# Patient Record
Sex: Female | Born: 1935
Health system: Southern US, Community
[De-identification: ages and names within clinical notes are randomized; demographics above are authoritative.]

## PROBLEM LIST (undated history)

## (undated) ENCOUNTER — Telehealth

## (undated) ENCOUNTER — Encounter: Attending: Hematology & Oncology | Primary: Hematology & Oncology

## (undated) ENCOUNTER — Ambulatory Visit: Payer: MEDICARE | Attending: Nephrology | Primary: Nephrology

## (undated) ENCOUNTER — Encounter

## (undated) ENCOUNTER — Ambulatory Visit: Payer: MEDICARE

## (undated) ENCOUNTER — Telehealth: Attending: Nephrology | Primary: Nephrology

## (undated) ENCOUNTER — Ambulatory Visit

## (undated) ENCOUNTER — Encounter: Attending: Internal Medicine | Primary: Internal Medicine

## (undated) ENCOUNTER — Encounter: Attending: Nephrology | Primary: Nephrology

## (undated) DIAGNOSIS — N959 Unspecified menopausal and perimenopausal disorder: Secondary | ICD-10-CM

## (undated) DIAGNOSIS — R6339 Other feeding difficulties: Secondary | ICD-10-CM

## (undated) DIAGNOSIS — G629 Polyneuropathy, unspecified: Secondary | ICD-10-CM

## (undated) DIAGNOSIS — K2210 Ulcer of esophagus without bleeding: Secondary | ICD-10-CM

## (undated) DIAGNOSIS — D649 Anemia, unspecified: Secondary | ICD-10-CM

## (undated) DIAGNOSIS — I4891 Unspecified atrial fibrillation: Secondary | ICD-10-CM

## (undated) DIAGNOSIS — E539 Vitamin B deficiency, unspecified: Secondary | ICD-10-CM

## (undated) DIAGNOSIS — B258 Other cytomegaloviral diseases: Secondary | ICD-10-CM

## (undated) DIAGNOSIS — I509 Heart failure, unspecified: Secondary | ICD-10-CM

## (undated) DIAGNOSIS — E039 Hypothyroidism, unspecified: Secondary | ICD-10-CM

## (undated) DIAGNOSIS — K449 Diaphragmatic hernia without obstruction or gangrene: Secondary | ICD-10-CM

## (undated) DIAGNOSIS — R0689 Other abnormalities of breathing: Secondary | ICD-10-CM

## (undated) DIAGNOSIS — C50919 Malignant neoplasm of unspecified site of unspecified female breast: Secondary | ICD-10-CM

## (undated) DIAGNOSIS — M329 Systemic lupus erythematosus, unspecified: Secondary | ICD-10-CM

## (undated) DIAGNOSIS — K219 Gastro-esophageal reflux disease without esophagitis: Secondary | ICD-10-CM

## (undated) DIAGNOSIS — Z923 Personal history of irradiation: Secondary | ICD-10-CM

## (undated) DIAGNOSIS — I272 Pulmonary hypertension, unspecified: Secondary | ICD-10-CM

## (undated) DIAGNOSIS — R633 Feeding difficulties: Secondary | ICD-10-CM

## (undated) DIAGNOSIS — M1990 Unspecified osteoarthritis, unspecified site: Secondary | ICD-10-CM

## (undated) DIAGNOSIS — E785 Hyperlipidemia, unspecified: Secondary | ICD-10-CM

## (undated) DIAGNOSIS — I10 Essential (primary) hypertension: Secondary | ICD-10-CM

## (undated) DIAGNOSIS — R600 Localized edema: Secondary | ICD-10-CM

## (undated) DIAGNOSIS — Z9109 Other allergy status, other than to drugs and biological substances: Secondary | ICD-10-CM

## (undated) DIAGNOSIS — N009 Acute nephritic syndrome with unspecified morphologic changes: Secondary | ICD-10-CM

## (undated) DIAGNOSIS — IMO0001 Reserved for inherently not codable concepts without codable children: Secondary | ICD-10-CM

## (undated) DIAGNOSIS — G609 Hereditary and idiopathic neuropathy, unspecified: Secondary | ICD-10-CM

## (undated) DIAGNOSIS — R0902 Hypoxemia: Secondary | ICD-10-CM

## (undated) DIAGNOSIS — N3942 Incontinence without sensory awareness: Secondary | ICD-10-CM

## (undated) DIAGNOSIS — N289 Disorder of kidney and ureter, unspecified: Secondary | ICD-10-CM

## (undated) DIAGNOSIS — B0223 Postherpetic polyneuropathy: Secondary | ICD-10-CM

## (undated) DIAGNOSIS — I82409 Acute embolism and thrombosis of unspecified deep veins of unspecified lower extremity: Secondary | ICD-10-CM

## (undated) DIAGNOSIS — M8580 Other specified disorders of bone density and structure, unspecified site: Secondary | ICD-10-CM

## (undated) DIAGNOSIS — R6889 Other general symptoms and signs: Secondary | ICD-10-CM

## (undated) DIAGNOSIS — B029 Zoster without complications: Secondary | ICD-10-CM

## (undated) DIAGNOSIS — M5430 Sciatica, unspecified side: Secondary | ICD-10-CM

## (undated) DIAGNOSIS — I270 Primary pulmonary hypertension: Secondary | ICD-10-CM

## (undated) DIAGNOSIS — I499 Cardiac arrhythmia, unspecified: Secondary | ICD-10-CM

## (undated) DIAGNOSIS — J189 Pneumonia, unspecified organism: Secondary | ICD-10-CM

## (undated) DIAGNOSIS — K208 Other esophagitis: Secondary | ICD-10-CM

## (undated) DIAGNOSIS — C801 Malignant (primary) neoplasm, unspecified: Secondary | ICD-10-CM

## (undated) HISTORY — DX: Pneumonia, unspecified organism: J18.9

## (undated) HISTORY — PX: STOMACH SURGERY: SHX791

## (undated) HISTORY — PX: DIAGNOSTIC LAPAROSCOPY: SUR761

## (undated) HISTORY — DX: Other cytomegaloviral diseases: B25.8

## (undated) HISTORY — DX: Postherpetic polyneuropathy: B02.23

## (undated) HISTORY — DX: Ulcer of esophagus without bleeding: K22.10

## (undated) HISTORY — DX: Primary pulmonary hypertension: I27.0

## (undated) HISTORY — DX: Unspecified menopausal and perimenopausal disorder: N95.9

## (undated) HISTORY — DX: Other esophagitis: K20.8

## (undated) HISTORY — DX: Systemic lupus erythematosus, unspecified: M32.9

## (undated) HISTORY — PX: MOHS SURGERY: SHX181

## (undated) HISTORY — DX: Acute nephritic syndrome with unspecified morphologic changes: N00.9

## (undated) HISTORY — DX: Hypothyroidism, unspecified: E03.9

## (undated) HISTORY — DX: Pulmonary hypertension, unspecified: I27.20

## (undated) HISTORY — PX: BREAST SURGERY: SHX581

## (undated) SURGERY — ESOPHAGOGASTRODUODENOSCOPY (EGD) WITH PROPOFOL
Anesthesia: Monitor Anesthesia Care

---
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
## 1998-01-30 ENCOUNTER — Encounter: Admission: RE | Admit: 1998-01-30 | Discharge: 1998-01-30 | Payer: Self-pay | Admitting: Internal Medicine

## 1998-05-27 ENCOUNTER — Encounter: Admission: RE | Admit: 1998-05-27 | Discharge: 1998-05-27 | Payer: Self-pay | Admitting: Internal Medicine

## 1998-05-28 ENCOUNTER — Encounter: Admission: RE | Admit: 1998-05-28 | Discharge: 1998-05-28 | Payer: Self-pay | Admitting: Internal Medicine

## 1998-06-29 ENCOUNTER — Other Ambulatory Visit: Admission: RE | Admit: 1998-06-29 | Discharge: 1998-06-29 | Payer: Self-pay | Admitting: *Deleted

## 1998-11-19 ENCOUNTER — Encounter: Admission: RE | Admit: 1998-11-19 | Discharge: 1998-11-19 | Payer: Self-pay | Admitting: Internal Medicine

## 1998-11-19 ENCOUNTER — Ambulatory Visit (HOSPITAL_COMMUNITY): Admission: RE | Admit: 1998-11-19 | Discharge: 1998-11-19 | Payer: Self-pay | Admitting: Internal Medicine

## 1999-12-09 ENCOUNTER — Encounter: Admission: RE | Admit: 1999-12-09 | Discharge: 1999-12-09 | Payer: Self-pay | Admitting: Internal Medicine

## 2000-04-24 ENCOUNTER — Ambulatory Visit (HOSPITAL_COMMUNITY): Admission: RE | Admit: 2000-04-24 | Discharge: 2000-04-24 | Payer: Self-pay | Admitting: Internal Medicine

## 2000-04-24 ENCOUNTER — Encounter: Admission: RE | Admit: 2000-04-24 | Discharge: 2000-04-24 | Payer: Self-pay | Admitting: Internal Medicine

## 2000-04-24 ENCOUNTER — Encounter: Payer: Self-pay | Admitting: Internal Medicine

## 2000-05-30 ENCOUNTER — Ambulatory Visit (HOSPITAL_COMMUNITY): Admission: RE | Admit: 2000-05-30 | Discharge: 2000-05-30 | Payer: Self-pay | Admitting: Internal Medicine

## 2000-05-30 ENCOUNTER — Encounter: Payer: Self-pay | Admitting: Internal Medicine

## 2000-05-30 ENCOUNTER — Encounter: Admission: RE | Admit: 2000-05-30 | Discharge: 2000-05-30 | Payer: Self-pay | Admitting: Hematology and Oncology

## 2000-07-12 ENCOUNTER — Encounter: Payer: Self-pay | Admitting: Internal Medicine

## 2000-07-12 ENCOUNTER — Encounter: Admission: RE | Admit: 2000-07-12 | Discharge: 2000-07-12 | Payer: Self-pay | Admitting: Internal Medicine

## 2000-07-12 ENCOUNTER — Ambulatory Visit (HOSPITAL_COMMUNITY): Admission: RE | Admit: 2000-07-12 | Discharge: 2000-07-12 | Payer: Self-pay | Admitting: Internal Medicine

## 2000-12-22 ENCOUNTER — Encounter: Admission: RE | Admit: 2000-12-22 | Discharge: 2000-12-22 | Payer: Self-pay | Admitting: Internal Medicine

## 2001-07-11 ENCOUNTER — Encounter: Admission: RE | Admit: 2001-07-11 | Discharge: 2001-07-11 | Payer: Self-pay

## 2001-08-15 ENCOUNTER — Encounter: Admission: RE | Admit: 2001-08-15 | Discharge: 2001-08-15 | Payer: Self-pay

## 2001-12-07 ENCOUNTER — Ambulatory Visit (HOSPITAL_COMMUNITY): Admission: RE | Admit: 2001-12-07 | Discharge: 2001-12-07 | Payer: Self-pay | Admitting: Internal Medicine

## 2001-12-07 ENCOUNTER — Encounter: Admission: RE | Admit: 2001-12-07 | Discharge: 2001-12-07 | Payer: Self-pay | Admitting: Internal Medicine

## 2002-06-17 ENCOUNTER — Encounter: Admission: RE | Admit: 2002-06-17 | Discharge: 2002-06-17 | Payer: Self-pay | Admitting: Internal Medicine

## 2002-07-11 ENCOUNTER — Encounter: Admission: RE | Admit: 2002-07-11 | Discharge: 2002-07-11 | Payer: Self-pay | Admitting: Internal Medicine

## 2003-02-28 ENCOUNTER — Encounter: Admission: RE | Admit: 2003-02-28 | Discharge: 2003-02-28 | Payer: Self-pay | Admitting: Internal Medicine

## 2003-06-16 ENCOUNTER — Encounter: Admission: RE | Admit: 2003-06-16 | Discharge: 2003-06-16 | Payer: Self-pay | Admitting: Internal Medicine

## 2003-07-28 ENCOUNTER — Encounter: Admission: RE | Admit: 2003-07-28 | Discharge: 2003-07-28 | Payer: Self-pay | Admitting: Internal Medicine

## 2003-07-28 ENCOUNTER — Ambulatory Visit (HOSPITAL_COMMUNITY): Admission: RE | Admit: 2003-07-28 | Discharge: 2003-07-28 | Payer: Self-pay | Admitting: Internal Medicine

## 2003-09-16 ENCOUNTER — Encounter: Admission: RE | Admit: 2003-09-16 | Discharge: 2003-09-16 | Payer: Self-pay | Admitting: Internal Medicine

## 2003-10-03 ENCOUNTER — Encounter: Admission: RE | Admit: 2003-10-03 | Discharge: 2003-10-03 | Payer: Self-pay | Admitting: Internal Medicine

## 2004-03-11 ENCOUNTER — Encounter: Admission: RE | Admit: 2004-03-11 | Discharge: 2004-03-11 | Payer: Self-pay | Admitting: Psychiatry

## 2004-09-06 ENCOUNTER — Ambulatory Visit: Payer: Self-pay | Admitting: Internal Medicine

## 2004-09-06 ENCOUNTER — Ambulatory Visit (HOSPITAL_COMMUNITY): Admission: RE | Admit: 2004-09-06 | Discharge: 2004-09-06 | Payer: Self-pay | Admitting: Internal Medicine

## 2004-09-24 ENCOUNTER — Ambulatory Visit: Payer: Self-pay | Admitting: Internal Medicine

## 2004-10-06 ENCOUNTER — Ambulatory Visit: Payer: Self-pay | Admitting: Cardiology

## 2004-10-08 ENCOUNTER — Ambulatory Visit: Payer: Self-pay | Admitting: Cardiology

## 2004-11-16 ENCOUNTER — Ambulatory Visit (HOSPITAL_COMMUNITY): Admission: RE | Admit: 2004-11-16 | Discharge: 2004-11-16 | Payer: Self-pay | Admitting: Internal Medicine

## 2005-01-13 ENCOUNTER — Ambulatory Visit: Payer: Self-pay | Admitting: Podiatry

## 2005-02-25 ENCOUNTER — Other Ambulatory Visit: Payer: Self-pay

## 2005-02-25 ENCOUNTER — Emergency Department: Payer: Self-pay | Admitting: Emergency Medicine

## 2005-02-28 ENCOUNTER — Ambulatory Visit: Payer: Self-pay | Admitting: Emergency Medicine

## 2005-03-16 ENCOUNTER — Encounter: Payer: Self-pay | Admitting: Podiatry

## 2005-03-19 ENCOUNTER — Encounter: Payer: Self-pay | Admitting: Podiatry

## 2005-04-05 ENCOUNTER — Ambulatory Visit: Payer: Self-pay | Admitting: Obstetrics and Gynecology

## 2005-06-01 ENCOUNTER — Ambulatory Visit: Payer: Self-pay | Admitting: Unknown Physician Specialty

## 2005-12-16 ENCOUNTER — Ambulatory Visit: Payer: Self-pay | Admitting: Internal Medicine

## 2005-12-30 ENCOUNTER — Ambulatory Visit: Payer: Self-pay | Admitting: Internal Medicine

## 2006-01-18 ENCOUNTER — Encounter: Payer: Self-pay | Admitting: Internal Medicine

## 2006-02-16 ENCOUNTER — Ambulatory Visit (HOSPITAL_COMMUNITY): Admission: RE | Admit: 2006-02-16 | Discharge: 2006-02-16 | Payer: Self-pay | Admitting: Internal Medicine

## 2006-02-16 ENCOUNTER — Ambulatory Visit: Payer: Self-pay | Admitting: Internal Medicine

## 2006-04-12 ENCOUNTER — Emergency Department: Payer: Self-pay | Admitting: Emergency Medicine

## 2006-06-06 ENCOUNTER — Ambulatory Visit: Payer: Self-pay | Admitting: Psychiatry

## 2006-06-16 ENCOUNTER — Ambulatory Visit: Payer: Self-pay | Admitting: Psychiatry

## 2006-07-25 ENCOUNTER — Ambulatory Visit: Payer: Self-pay | Admitting: Obstetrics and Gynecology

## 2006-09-25 DIAGNOSIS — E119 Type 2 diabetes mellitus without complications: Secondary | ICD-10-CM

## 2006-09-25 DIAGNOSIS — M3213 Lung involvement in systemic lupus erythematosus: Secondary | ICD-10-CM

## 2006-09-25 DIAGNOSIS — Z87898 Personal history of other specified conditions: Secondary | ICD-10-CM

## 2006-09-25 DIAGNOSIS — D518 Other vitamin B12 deficiency anemias: Secondary | ICD-10-CM

## 2006-09-25 DIAGNOSIS — K219 Gastro-esophageal reflux disease without esophagitis: Secondary | ICD-10-CM

## 2006-09-25 DIAGNOSIS — E039 Hypothyroidism, unspecified: Secondary | ICD-10-CM

## 2006-09-25 DIAGNOSIS — Z87891 Personal history of nicotine dependence: Secondary | ICD-10-CM

## 2006-10-06 ENCOUNTER — Ambulatory Visit: Payer: Self-pay | Admitting: Internal Medicine

## 2006-10-06 DIAGNOSIS — G609 Hereditary and idiopathic neuropathy, unspecified: Secondary | ICD-10-CM | POA: Insufficient documentation

## 2006-10-09 ENCOUNTER — Telehealth (INDEPENDENT_AMBULATORY_CARE_PROVIDER_SITE_OTHER): Payer: Self-pay | Admitting: *Deleted

## 2006-10-09 LAB — CONVERTED CEMR LAB
CO2: 24 meq/L (ref 19–32)
Calcium: 9.1 mg/dL (ref 8.4–10.5)
Creatinine, Ser: 0.92 mg/dL (ref 0.40–1.20)
Glucose, Bld: 88 mg/dL (ref 70–99)
Potassium: 4.2 meq/L (ref 3.5–5.3)
Sed Rate: 21 mm/hr (ref 0–22)
Sodium: 141 meq/L (ref 135–145)
TSH: 4.921 microintl units/mL (ref 0.350–5.50)

## 2006-11-14 ENCOUNTER — Ambulatory Visit: Payer: Self-pay

## 2007-03-05 ENCOUNTER — Telehealth: Payer: Self-pay | Admitting: *Deleted

## 2007-03-22 ENCOUNTER — Ambulatory Visit: Payer: Self-pay | Admitting: Internal Medicine

## 2007-03-28 LAB — CONVERTED CEMR LAB
ALT: 11 units/L (ref 0–35)
Albumin: 4.3 g/dL (ref 3.5–5.2)
BUN: 18 mg/dL (ref 6–23)
Chloride: 106 meq/L (ref 96–112)
Creatinine, Ser: 0.98 mg/dL (ref 0.40–1.20)
Free T4: 1.5 ng/dL (ref 0.89–1.80)
HCT: 42.3 % (ref 36.0–46.0)
MCV: 97.5 fL (ref 78.0–100.0)
Potassium: 4.2 meq/L (ref 3.5–5.3)
RBC: 4.34 M/uL (ref 3.87–5.11)
Sed Rate: 18 mm/hr (ref 0–22)
Total Protein: 7 g/dL (ref 6.0–8.3)

## 2007-04-03 ENCOUNTER — Encounter (INDEPENDENT_AMBULATORY_CARE_PROVIDER_SITE_OTHER): Payer: Self-pay | Admitting: Internal Medicine

## 2007-04-03 ENCOUNTER — Telehealth (INDEPENDENT_AMBULATORY_CARE_PROVIDER_SITE_OTHER): Payer: Self-pay | Admitting: *Deleted

## 2007-04-26 ENCOUNTER — Encounter (INDEPENDENT_AMBULATORY_CARE_PROVIDER_SITE_OTHER): Payer: Self-pay | Admitting: Internal Medicine

## 2007-05-04 ENCOUNTER — Encounter (INDEPENDENT_AMBULATORY_CARE_PROVIDER_SITE_OTHER): Payer: Self-pay | Admitting: Internal Medicine

## 2007-05-28 ENCOUNTER — Encounter (INDEPENDENT_AMBULATORY_CARE_PROVIDER_SITE_OTHER): Payer: Self-pay | Admitting: Internal Medicine

## 2007-07-18 ENCOUNTER — Telehealth (INDEPENDENT_AMBULATORY_CARE_PROVIDER_SITE_OTHER): Payer: Self-pay | Admitting: Internal Medicine

## 2007-07-30 ENCOUNTER — Ambulatory Visit: Payer: Self-pay | Admitting: Obstetrics and Gynecology

## 2007-11-17 ENCOUNTER — Encounter (INDEPENDENT_AMBULATORY_CARE_PROVIDER_SITE_OTHER): Payer: Self-pay | Admitting: Internal Medicine

## 2007-11-29 ENCOUNTER — Telehealth (INDEPENDENT_AMBULATORY_CARE_PROVIDER_SITE_OTHER): Payer: Self-pay | Admitting: Internal Medicine

## 2008-02-20 ENCOUNTER — Telehealth (INDEPENDENT_AMBULATORY_CARE_PROVIDER_SITE_OTHER): Payer: Self-pay | Admitting: Internal Medicine

## 2008-03-25 ENCOUNTER — Telehealth (INDEPENDENT_AMBULATORY_CARE_PROVIDER_SITE_OTHER): Payer: Self-pay | Admitting: Internal Medicine

## 2008-04-22 ENCOUNTER — Telehealth (INDEPENDENT_AMBULATORY_CARE_PROVIDER_SITE_OTHER): Payer: Self-pay | Admitting: Internal Medicine

## 2008-05-15 ENCOUNTER — Encounter (INDEPENDENT_AMBULATORY_CARE_PROVIDER_SITE_OTHER): Payer: Self-pay | Admitting: Internal Medicine

## 2008-05-23 ENCOUNTER — Ambulatory Visit: Payer: Self-pay | Admitting: Unknown Physician Specialty

## 2008-06-04 ENCOUNTER — Encounter (INDEPENDENT_AMBULATORY_CARE_PROVIDER_SITE_OTHER): Payer: Self-pay | Admitting: Internal Medicine

## 2008-06-04 ENCOUNTER — Ambulatory Visit: Payer: Self-pay | Admitting: Unknown Physician Specialty

## 2008-06-13 ENCOUNTER — Ambulatory Visit: Payer: Self-pay | Admitting: Internal Medicine

## 2008-06-13 DIAGNOSIS — L930 Discoid lupus erythematosus: Secondary | ICD-10-CM

## 2008-06-13 DIAGNOSIS — R0602 Shortness of breath: Secondary | ICD-10-CM

## 2008-06-16 ENCOUNTER — Ambulatory Visit: Payer: Self-pay | Admitting: Internal Medicine

## 2008-06-16 ENCOUNTER — Encounter (INDEPENDENT_AMBULATORY_CARE_PROVIDER_SITE_OTHER): Payer: Self-pay | Admitting: Internal Medicine

## 2008-07-29 ENCOUNTER — Encounter (INDEPENDENT_AMBULATORY_CARE_PROVIDER_SITE_OTHER): Payer: Self-pay | Admitting: Internal Medicine

## 2008-08-28 ENCOUNTER — Ambulatory Visit: Payer: Self-pay | Admitting: Obstetrics and Gynecology

## 2008-09-03 ENCOUNTER — Encounter (INDEPENDENT_AMBULATORY_CARE_PROVIDER_SITE_OTHER): Payer: Self-pay | Admitting: Internal Medicine

## 2008-09-09 ENCOUNTER — Encounter (INDEPENDENT_AMBULATORY_CARE_PROVIDER_SITE_OTHER): Payer: Self-pay | Admitting: Internal Medicine

## 2008-11-19 ENCOUNTER — Encounter (INDEPENDENT_AMBULATORY_CARE_PROVIDER_SITE_OTHER): Payer: Self-pay | Admitting: Internal Medicine

## 2008-11-25 ENCOUNTER — Telehealth (INDEPENDENT_AMBULATORY_CARE_PROVIDER_SITE_OTHER): Payer: Self-pay | Admitting: Internal Medicine

## 2008-11-26 ENCOUNTER — Ambulatory Visit: Payer: Self-pay | Admitting: Internal Medicine

## 2008-11-26 ENCOUNTER — Encounter (INDEPENDENT_AMBULATORY_CARE_PROVIDER_SITE_OTHER): Payer: Self-pay | Admitting: Internal Medicine

## 2008-12-03 ENCOUNTER — Ambulatory Visit: Payer: Self-pay | Admitting: Internal Medicine

## 2008-12-13 ENCOUNTER — Encounter (INDEPENDENT_AMBULATORY_CARE_PROVIDER_SITE_OTHER): Payer: Self-pay | Admitting: Internal Medicine

## 2008-12-22 ENCOUNTER — Ambulatory Visit: Payer: Self-pay | Admitting: Internal Medicine

## 2008-12-31 ENCOUNTER — Encounter (INDEPENDENT_AMBULATORY_CARE_PROVIDER_SITE_OTHER): Payer: Self-pay | Admitting: Internal Medicine

## 2009-01-20 ENCOUNTER — Ambulatory Visit: Payer: Self-pay | Admitting: Internal Medicine

## 2009-01-22 LAB — CONVERTED CEMR LAB
CO2: 24 meq/L (ref 19–32)
GFR calc non Af Amer: 53 mL/min — ABNORMAL LOW (ref >60)
Glucose, Bld: 116 mg/dL — ABNORMAL HIGH (ref 70–99)

## 2009-02-03 ENCOUNTER — Telehealth (INDEPENDENT_AMBULATORY_CARE_PROVIDER_SITE_OTHER): Payer: Self-pay | Admitting: Internal Medicine

## 2009-02-24 ENCOUNTER — Ambulatory Visit: Payer: Self-pay | Admitting: Internal Medicine

## 2009-03-10 ENCOUNTER — Ambulatory Visit: Payer: Self-pay | Admitting: Internal Medicine

## 2009-03-10 LAB — CONVERTED CEMR LAB
Bilirubin Urine: NEGATIVE
Glucose, Urine, Semiquant: NEGATIVE
Nitrite: NEGATIVE
Protein, U semiquant: 30
Urobilinogen, UA: 0.2
WBC Urine, dipstick: NEGATIVE

## 2009-04-07 ENCOUNTER — Encounter (INDEPENDENT_AMBULATORY_CARE_PROVIDER_SITE_OTHER): Payer: Self-pay | Admitting: Internal Medicine

## 2009-04-15 ENCOUNTER — Ambulatory Visit: Payer: Self-pay | Admitting: Internal Medicine

## 2009-04-22 ENCOUNTER — Encounter (INDEPENDENT_AMBULATORY_CARE_PROVIDER_SITE_OTHER): Payer: Self-pay | Admitting: Internal Medicine

## 2009-05-13 ENCOUNTER — Telehealth (INDEPENDENT_AMBULATORY_CARE_PROVIDER_SITE_OTHER): Payer: Self-pay | Admitting: Internal Medicine

## 2009-07-03 ENCOUNTER — Other Ambulatory Visit: Payer: Self-pay | Admitting: Internal Medicine

## 2009-07-06 ENCOUNTER — Other Ambulatory Visit: Payer: Self-pay | Admitting: Internal Medicine

## 2009-08-27 ENCOUNTER — Ambulatory Visit: Payer: Self-pay | Admitting: Unknown Physician Specialty

## 2009-09-02 ENCOUNTER — Ambulatory Visit: Payer: Self-pay | Admitting: Internal Medicine

## 2009-09-03 ENCOUNTER — Ambulatory Visit: Payer: Self-pay | Admitting: Unknown Physician Specialty

## 2009-09-21 ENCOUNTER — Ambulatory Visit: Payer: Self-pay | Admitting: Internal Medicine

## 2009-10-08 ENCOUNTER — Ambulatory Visit: Payer: Self-pay | Admitting: Rheumatology

## 2009-11-13 ENCOUNTER — Ambulatory Visit: Payer: Self-pay | Admitting: Internal Medicine

## 2009-11-19 ENCOUNTER — Ambulatory Visit: Payer: Self-pay | Admitting: Internal Medicine

## 2009-12-24 ENCOUNTER — Ambulatory Visit: Payer: Self-pay | Admitting: Cardiology

## 2010-02-18 ENCOUNTER — Inpatient Hospital Stay: Payer: Self-pay | Admitting: Internal Medicine

## 2010-06-16 ENCOUNTER — Ambulatory Visit: Payer: Self-pay | Admitting: Internal Medicine

## 2010-06-22 ENCOUNTER — Ambulatory Visit: Payer: Self-pay | Admitting: Internal Medicine

## 2010-08-19 ENCOUNTER — Other Ambulatory Visit: Payer: Self-pay | Admitting: Internal Medicine

## 2010-09-19 ENCOUNTER — Other Ambulatory Visit: Payer: Self-pay | Admitting: Internal Medicine

## 2010-09-22 ENCOUNTER — Ambulatory Visit: Payer: Self-pay | Admitting: Internal Medicine

## 2010-10-23 ENCOUNTER — Emergency Department: Payer: Self-pay | Admitting: Emergency Medicine

## 2011-01-12 ENCOUNTER — Ambulatory Visit: Payer: Self-pay | Admitting: Internal Medicine

## 2011-01-19 ENCOUNTER — Encounter: Payer: Self-pay | Admitting: Internal Medicine

## 2011-05-17 ENCOUNTER — Encounter: Payer: Self-pay | Admitting: Internal Medicine

## 2011-05-17 ENCOUNTER — Ambulatory Visit (INDEPENDENT_AMBULATORY_CARE_PROVIDER_SITE_OTHER): Payer: Medicare Other | Admitting: Internal Medicine

## 2011-05-17 VITALS — BP 148/85 | HR 96 | Temp 98.6°F | Resp 16 | Ht 65.0 in | Wt 171.0 lb

## 2011-05-17 DIAGNOSIS — B370 Candidal stomatitis: Secondary | ICD-10-CM

## 2011-05-17 DIAGNOSIS — J189 Pneumonia, unspecified organism: Secondary | ICD-10-CM

## 2011-05-17 DIAGNOSIS — E039 Hypothyroidism, unspecified: Secondary | ICD-10-CM

## 2011-05-17 LAB — COMPREHENSIVE METABOLIC PANEL
Alkaline Phosphatase: 48 U/L (ref 39–117)
BUN: 22 mg/dL (ref 6–23)
Chloride: 106 mEq/L (ref 96–112)
Creatinine, Ser: 1 mg/dL (ref 0.4–1.2)
GFR: 58.9 mL/min — ABNORMAL LOW (ref >60.00)
Glucose, Bld: 92 mg/dL (ref 70–99)
Potassium: 4 mEq/L (ref 3.5–5.1)
Sodium: 142 mEq/L (ref 135–145)
Total Bilirubin: 0.3 mg/dL (ref 0.3–1.2)

## 2011-05-17 LAB — CBC WITH DIFFERENTIAL/PLATELET
HCT: 40.8 % (ref 36.0–46.0)
Lymphocytes Relative: 10.3 % — ABNORMAL LOW (ref 12.0–46.0)
Lymphs Abs: 0.8 10*3/uL (ref 0.7–4.0)
MCHC: 33.3 g/dL (ref 30.0–36.0)
MCV: 92.4 fl (ref 78.0–100.0)
Monocytes Absolute: 0.7 10*3/uL (ref 0.1–1.0)
Neutro Abs: 6.7 10*3/uL (ref 1.4–7.7)
Platelets: 275 10*3/uL (ref 150.0–400.0)
RBC: 4.41 Mil/uL (ref 3.87–5.11)
WBC: 8.3 10*3/uL (ref 4.5–10.5)

## 2011-05-17 LAB — TSH: TSH: 4.29 u[IU]/mL (ref 0.35–5.50)

## 2011-05-17 MED ORDER — AZITHROMYCIN 500 MG PO TABS: 500.0000 mg | ORAL_TABLET | Freq: Every day | ORAL | Status: AC

## 2011-05-17 MED ORDER — FLUCONAZOLE 100 MG PO TABS: 100.0000 mg | ORAL_TABLET | Freq: Every day | ORAL | Status: DC

## 2011-05-17 NOTE — Progress Notes (Signed)
Subjective:    Patient ID: Diana [name], female    DOB: 03/25/1935, 76 y.o.   MRN: [id number]  URI  This is a new problem. The current episode started in the past 7 days. The problem has been gradually worsening. There has been no fever (however having chills). Associated symptoms include chest pain, congestion, coughing and a sore throat. Pertinent negatives include no abdominal pain, diarrhea, dysuria, ear pain, nausea, neck pain, rash, sinus pain, sneezing, vomiting or wheezing. She has tried acetaminophen and decongestant for the symptoms. The treatment provided mild relief.   patient reports that symptoms are similar to recent episode of pneumonia. She reports severe progressive fatigue, chills. Appetite is normal and po intake good. Notably she is on CellCept and prednisone for treatment of her lupus. Denies sick contacts.    Outpatient Encounter Prescriptions as of 05/17/2011  Medication Sig Dispense Refill  . aspirin 81 MG tablet Take 81 mg by mouth daily.        Marland Kitchen bosentan (TRACLEER) 125 MG tablet Take 125 mg by mouth 2 (two) times daily.        . cyanocobalamin (,VITAMIN B-12,) 1000 MCG/ML injection Inject 1,000 mcg into the muscle every 30 (thirty) days.        Marland Kitchen estrogen, conjugated,-medroxyprogesterone (PREMPRO) 0.45-1.5 MG per tablet Take 1 tablet by mouth daily.        . hydroxychloroquine (PLAQUENIL) 200 MG tablet Take 200 mg by mouth daily.        Marland Kitchen levothyroxine (SYNTHROID, LEVOTHROID) 125 MCG tablet Take 125 mcg by mouth daily.        . mycophenolate (CELLCEPT) 250 MG capsule Take 1 mg by mouth 2 (two) times daily.        Marland Kitchen omeprazole (PRILOSEC) 20 MG capsule Take 20 mg by mouth daily.        . predniSONE (DELTASONE) 10 MG tablet Take 5 mg by mouth daily.        . vitamin D, CHOLECALCIFEROL, 400 UNITS tablet Take 400 Units by mouth daily.        Marland Kitchen azithromycin (ZITHROMAX) 500 MG tablet Take 1 tablet (500 mg total) by mouth daily. Take 1 tablet daily for 7 days.  7 tablet  7   . fluconazole (DIFLUCAN) 100 MG tablet Take 1 tablet (100 mg total) by mouth daily.  5 tablet  0  . predniSONE (DELTASONE) 1 MG tablet Take 1 mg by mouth daily.          Review of Systems  Constitutional: Positive for chills and fatigue. Negative for fever.  HENT: Positive for congestion, sore throat and postnasal drip. Negative for ear pain, sneezing, trouble swallowing, neck pain and ear discharge.   Eyes: Negative for visual disturbance.  Respiratory: Positive for cough, chest tightness and shortness of breath. Negative for wheezing.   Cardiovascular: Positive for chest pain. Negative for palpitations and leg swelling.  Gastrointestinal: Negative for nausea, vomiting, abdominal pain, diarrhea, constipation and abdominal distention.  Genitourinary: Negative for dysuria and flank pain.  Musculoskeletal: Positive for myalgias and arthralgias.  Skin: Negative for rash.  Neurological: Positive for weakness. Negative for dizziness.  Hematological: Negative for adenopathy.    BP 148/85  Pulse 96  Temp(Src) 98.6 F (37 C) (Oral)  Resp 16  Ht 5\' 5"  (1.651 m)  Wt 171 lb (77.565 kg)  BMI 28.46 kg/m2  SpO2 97%     Objective:   Physical Exam  Constitutional: She is oriented to person, place, and time. She  appears well-developed and well-nourished. No distress.  HENT:  Head: Normocephalic and atraumatic.  Right Ear: Hearing, tympanic membrane, external ear and ear canal normal.  Left Ear: Hearing, tympanic membrane, external ear and ear canal normal.  Nose: Nose normal.  Mouth/Throat: Oropharyngeal exudate (consistent with thrush) and posterior oropharyngeal erythema present. No posterior oropharyngeal edema.  Eyes: Conjunctivae and EOM are normal. Pupils are equal, round, and reactive to light. Right eye exhibits no discharge. Left eye exhibits no discharge. No scleral icterus.  Neck: Normal range of motion. Neck supple. No tracheal deviation present. No thyromegaly present.    Cardiovascular: Normal rate, regular rhythm and normal heart sounds.  Exam reveals no gallop and no friction rub.   No murmur heard. Pulmonary/Chest: Effort normal. No accessory muscle usage or stridor. Not tachypneic. No respiratory distress. She has no wheezes. She has no rhonchi. She has rales in the right middle field and the left lower field.  Musculoskeletal: Normal range of motion.  Lymphadenopathy:    She has no cervical adenopathy.  Neurological: She is alert and oriented to person, place, and time. No cranial nerve deficit. Coordination normal.  Skin: Skin is warm and dry. No rash noted. She is not diaphoretic. No erythema. No pallor.  Psychiatric: She has a normal mood and affect. Her behavior is normal. Judgment and thought content normal.          Assessment & Plan:  1. Pneumonia - clinical presentation of chills, cough productive of purulent sputum, and malaise along with rales noted on left lung exam consistent with pneumonia. Patient has had several episodes of pneumonia in the recent past with similar presentation and is at high risk of infection given immunosuppresion with both cellcept and prednisone. We discussed CXR today, but she would like to defer, as this would not affect treatment at this time. She has several medication allergies. She reports that it azithromycin has worked well for her before and she tolerates this well. We will plan to treat her empirically with azithromycin 500 mg daily x7 days. She will have a CBC, blood culture, CMP drawn today. She will return to clinic in 2 days for followup exam, or earlier should her symptoms worsen.  2. Thrush - patient noted to have thrush on exam. Will treat with 5 day course of fluconazole. She will return to clinic later this week for followup.

## 2011-05-17 NOTE — Patient Instructions (Signed)
Pneumonia Pneumonia is an infection of the lungs. It may be caused by a bacteria or virus. Most forms are bacterial. Usually, these infections are caused by breathing infectious particles into the lungs (respiratory tract). SYMPTOMS  The most common problems (symptoms) are:   Cough.  Fever.   Chest pain.  Increased rate of breathing.   Wheezing.  Mucus production.   DIAGNOSIS  Often these infections are diagnosed on exam by your caregiver. Sometimes the diagnosis may require:   Chest X-rays.   Blood analysis.   Cultures. Blood cultures may be done to help find the cause of your pneumonia.  Your caregiver may do tests (blood gasses or pulse oximetry) to see how well your lungs are working. TREATMENT The bacterial pneumonias generally respond well to medicines (antibiotics) that kill germs. Viral infections must run their course. These infections will not respond to antibiotics. A pneumococcal shot (vaccine) is available to prevent a common bacterial pneumonia. This is usually suggested for the elderly and for other groups of higher risk individuals, such as those on chemotherapy or those who have problems with their immune system.  You will have pneumococcal screening or vaccination if you are over 77 years old and are not immunized.   If you are a smoker, it is time to quit. You may receive instructions on how to best stop smoking. Your caregiver can provide medicines and counseling to help you quit.  HOME CARE INSTRUCTIONS  Cough suppressants may be used if you are losing too much rest. However, coughing protects you by clearing your lungs. This is one reason for not using cough suppressants, if able, as they take away this protection.   Your caregiver may have prescribed an antibiotic if she or he feels your cough is caused by a bacterial infection. Take all your medicine until you are finished.   Your caregiver may also prescribe an expectorant to loosen the mucus to be coughed  up.   Only take over-the-counter or prescription medicines for pain, discomfort, or fever as directed by your caregiver.   Smoking is a common cause of bronchitis and can contribute to pneumonia. Stopping this habit is an important self-help step.   If you are a smoker and continue to smoke, your cough may last several weeks after your pneumonia has cleared.   A cold steam vaporizer or humidifier in your room or home may help loosen mucus.   Coughing is often worse at night. Sleeping in a semi-upright position in a recliner or using a couple pillows under your head will help with this.   Get rest as you feel it is needed. Your body will usually let you know when to rest.  [place] IF:  You develop pus-like mucus (sputum) or your illness becomes worse. This is especially true if you are elderly or weakened from any other disease.   You cannot control your cough with suppressants and are losing sleep.   You begin coughing up blood.   You develop pain which is getting worse or is uncontrolled with medicines.   You or your child has an oral temperature above 101.5, not controlled by medicine.   Any of the symptoms which initially brought you in for treatment are getting worse rather than better.   You develop shortness of breath or chest pain.  MAKE SURE YOU:   Understand these instructions.   Will watch your condition.   Will get help right away if you are not doing well or  get worse.  Document Released: 09/05/2005 Document Re-Released: 02/23/2010 [facility] Patient Information 2011 [place].

## 2011-05-19 ENCOUNTER — Encounter: Payer: Self-pay | Admitting: Internal Medicine

## 2011-05-19 ENCOUNTER — Ambulatory Visit (INDEPENDENT_AMBULATORY_CARE_PROVIDER_SITE_OTHER): Payer: Medicare Other | Admitting: Internal Medicine

## 2011-05-19 VITALS — BP 149/97 | HR 91 | Temp 98.4°F | Resp 20 | Ht 65.0 in | Wt 172.2 lb

## 2011-05-19 DIAGNOSIS — R0600 Dyspnea, unspecified: Secondary | ICD-10-CM

## 2011-05-19 DIAGNOSIS — R0989 Other specified symptoms and signs involving the circulatory and respiratory systems: Secondary | ICD-10-CM

## 2011-05-19 DIAGNOSIS — R0609 Other forms of dyspnea: Secondary | ICD-10-CM

## 2011-05-19 NOTE — Progress Notes (Signed)
Subjective:    Patient ID: Diana [name], female    DOB: 1935/03/26, 75 y.o.   MRN: [id number]  HPI  76YO female presents for follow up dyspnea. She reports since starting antibiotic therapy 3 days ago her symptoms have improved. However, she had an episode of shortness of breath and rapid breathing which occurred 2 days ago. She reports feeling suddenly nauseous and short of breath. She sat down to rest encounter respirations at approximately 40 per minute. She denies any chest pain, chest tightness, palpitations, diaphoresis, nausea, or other symptoms at this time. She reports feeling very fatigued after this event. She reports her fatigue is slightly improved today. She denies any fever or chills.    Outpatient Encounter Prescriptions as of 05/19/2011  Medication Sig Dispense Refill  . aspirin 81 MG tablet Take 81 mg by mouth daily.        Marland Kitchen azithromycin (ZITHROMAX) 500 MG tablet Take 1 tablet (500 mg total) by mouth daily. Take 1 tablet daily for 7 days.  7 tablet  7  . bosentan (TRACLEER) 125 MG tablet Take 125 mg by mouth 2 (two) times daily.        . cyanocobalamin (,VITAMIN B-12,) 1000 MCG/ML injection Inject 1,000 mcg into the muscle every 30 (thirty) days.        Marland Kitchen estrogen, conjugated,-medroxyprogesterone (PREMPRO) 0.45-1.5 MG per tablet Take 1 tablet by mouth daily.        . fluconazole (DIFLUCAN) 100 MG tablet Take 1 tablet (100 mg total) by mouth daily.  5 tablet  0  . hydroxychloroquine (PLAQUENIL) 200 MG tablet Take 200 mg by mouth daily.        Marland Kitchen levothyroxine (SYNTHROID, LEVOTHROID) 125 MCG tablet Take 125 mcg by mouth daily.        . mycophenolate (CELLCEPT) 250 MG capsule Take 1 mg by mouth 2 (two) times daily.        Marland Kitchen omeprazole (PRILOSEC) 20 MG capsule Take 20 mg by mouth daily.        . predniSONE (DELTASONE) 1 MG tablet Take 1 mg by mouth daily.        . predniSONE (DELTASONE) 10 MG tablet Take 5 mg by mouth daily.        . vitamin D, CHOLECALCIFEROL, 400 UNITS tablet  Take 400 Units by mouth daily.        .  Review of Systems Constitutional: Positive for chills and fatigue. Negative for fever.  HENT: Positive for congestion, sore throat and postnasal drip. Negative for ear pain, sneezing, trouble swallowing, neck pain and ear discharge.   Eyes: Negative for visual disturbance.  Respiratory: Positive for cough, chest tightness and shortness of breath. Negative for wheezing.   Cardiovascular: Positive for chest pain. Negative for palpitations and leg swelling.  Gastrointestinal: Negative for nausea, vomiting, abdominal pain, diarrhea, constipation and abdominal distention.  Genitourinary: Negative for dysuria and flank pain.  Musculoskeletal: Positive for myalgias and arthralgias.  Skin: Negative for rash.  Neurological: Positive for weakness. Negative for dizziness.  Hematological: Negative for adenopathy.           BP 149/97  Pulse 91  Temp(Src) 98.4 F (36.9 C) (Oral)  Resp 20  Ht 5\' 5"  (1.651 m)  Wt 172 lb 4 oz (78.132 kg)  BMI 28.66 kg/m2  SpO2 97%     Objective:   Physical Exam  Constitutional: She is oriented to person, place, and time. She appears well-developed and well-nourished. No distress.  HENT:  Head:  Normocephalic and atraumatic.  Right Ear: Hearing, external ear and ear canal normal.  Left Ear: Hearing,external ear and ear canal normal.  Nose: Nose normal.  Eyes: Conjunctivae and EOM are normal. Pupils are equal, round, and reactive to light. Right eye exhibits no discharge. Left eye exhibits no discharge. No scleral icterus.  Neck: Normal range of motion. Neck supple. No tracheal deviation present. No thyromegaly present.  Cardiovascular: Normal rate, regular rhythm and normal heart sounds.  Exam reveals no gallop and no friction rub.   No murmur heard. Pulmonary/Chest: Effort normal. No accessory muscle usage or stridor. Not tachypneic. No respiratory distress. She has no wheezes. She has no rhonchi. She has no  rales. Musculoskeletal: Normal range of motion.  Lymphadenopathy:    She has no cervical adenopathy.  Neurological: She is alert and oriented to person, place, and time. No cranial nerve deficit. Coordination normal.  Skin: Skin is warm and dry. No rash noted. She is not diaphoretic. No erythema. No pallor.  Psychiatric: She has a normal mood and affect. Her behavior is normal. Judgment and thought content normal.       Assessment & Plan:  1. Pneumonia - patient presents to followup dyspnea and suspected pneumonia. Her symptoms have improved somewhat with antibiotic therapy, however her recent event with sudden onset of dyspnea and rapid breathing are concerning for cardiac etiology particularly given her history of lupus . Question if she may have pericardial effusion or be having cardiac ischemia. Exam today is normal. EKG performed today was normal. She is scheduled for echocardiogram tomorrow. She will return to clinic in one week. She will go to ED immediately if symptoms of dyspnea recur.

## 2011-05-19 NOTE — Patient Instructions (Addendum)
ECHO tomorrow. Return to clinic in 1 week.

## 2011-05-23 LAB — CULTURE, BLOOD (SINGLE): Organism ID, Bacteria: NO GROWTH

## 2011-05-24 ENCOUNTER — Other Ambulatory Visit: Payer: Self-pay | Admitting: Internal Medicine

## 2011-05-24 MED ORDER — PREDNISONE 10 MG PO TABS: 10.0000 mg | ORAL_TABLET | Freq: Every day | ORAL | Status: DC

## 2011-05-25 ENCOUNTER — Ambulatory Visit (INDEPENDENT_AMBULATORY_CARE_PROVIDER_SITE_OTHER): Payer: Medicare Other | Admitting: Internal Medicine

## 2011-05-25 ENCOUNTER — Encounter: Payer: Self-pay | Admitting: Internal Medicine

## 2011-05-25 VITALS — BP 134/85 | HR 90 | Temp 98.2°F | Resp 16 | Ht 64.0 in | Wt 170.8 lb

## 2011-05-25 DIAGNOSIS — E039 Hypothyroidism, unspecified: Secondary | ICD-10-CM

## 2011-05-25 DIAGNOSIS — R5381 Other malaise: Secondary | ICD-10-CM

## 2011-05-25 DIAGNOSIS — B370 Candidal stomatitis: Secondary | ICD-10-CM

## 2011-05-25 DIAGNOSIS — J189 Pneumonia, unspecified organism: Secondary | ICD-10-CM

## 2011-05-25 DIAGNOSIS — R5383 Other fatigue: Secondary | ICD-10-CM

## 2011-05-25 DIAGNOSIS — J329 Chronic sinusitis, unspecified: Secondary | ICD-10-CM

## 2011-05-25 MED ORDER — AZITHROMYCIN 500 MG PO TABS: 500.0000 mg | ORAL_TABLET | Freq: Every day | ORAL | Status: AC

## 2011-05-25 NOTE — Patient Instructions (Addendum)
Try using Simply Saline twice daily  For sinus lavage  Am and PM  And add mucinex 600 mg twice daily (generic is guaifenesin) to help thin out your secretions.  Avoid dairy until drainage improves.    Extend the azithromycin for a few more days  You may increase your thyroid dose to 150 mcg but we will need to recheck your TSH in 6 weeks

## 2011-05-25 NOTE — Progress Notes (Signed)
Subjective:    Patient ID: Diana [name], female    DOB: 18-Nov-1934, 76 y.o.   MRN: [id number]  HPI Ms. Mcgurn returns for recheck on her recent epsiode of pneumonia, treated by Dr. Dan Humphreys last week with a 7 day course of azithromycin.  She finished the antibiotics yesterday, and feels somewhat better but continues to have purulent sinus drainage that is tenacious and difficult to clear from her throat in the morning. She reports continued congestion, but denies ear pain, fevers or throat pain.  She was also treated for oral thrush.  Patient is immunocompromised, with Cellcept which is prescribed for her autoimmune condition (SLE).    Past Medical History  Diagnosis Date  . Lupus (systemic lupus erythematosus)   . Pulmonary hypertension   . Pneumonia   . Unspecified menopausal and postmenopausal disorder   . Primary pulmonary hypertension   . Acute glomerulonephritis with other specified pathological lesion in kidney in disease classified elsewhere   . Unspecified hypothyroidism     Current Outpatient Prescriptions on File Prior to Visit  Medication Sig Dispense Refill  . aspirin 81 MG tablet Take 81 mg by mouth daily.        Marland Kitchen bosentan (TRACLEER) 125 MG tablet Take 125 mg by mouth 2 (two) times daily.        . cyanocobalamin (,VITAMIN B-12,) 1000 MCG/ML injection Inject 1,000 mcg into the muscle every 30 (thirty) days.        Marland Kitchen estrogen, conjugated,-medroxyprogesterone (PREMPRO) 0.45-1.5 MG per tablet Take 1 tablet by mouth daily.        . hydroxychloroquine (PLAQUENIL) 200 MG tablet Take 200 mg by mouth daily.        Marland Kitchen levothyroxine (SYNTHROID, LEVOTHROID) 125 MCG tablet Take 125 mcg by mouth daily.        . mycophenolate (CELLCEPT) 250 MG capsule Take 1 mg by mouth. 3 capsules two times a day      . omeprazole (PRILOSEC) 20 MG capsule Take 40 mg by mouth daily.       . predniSONE (DELTASONE) 10 MG tablet Take 1 tablet (10 mg total) by mouth daily.  30 tablet  3  . vitamin D,  CHOLECALCIFEROL, 400 UNITS tablet Take 400 Units by mouth daily.          Review of Systems  Constitutional: Positive for chills and fatigue. Negative for fever.  HENT: Positive for congestion, sore throat and postnasal drip. Negative for ear pain, sneezing, trouble swallowing, neck pain and ear discharge.   Eyes: Negative for visual disturbance.  Respiratory: Positive for cough, chest tightness and shortness of breath. Negative for wheezing.   Cardiovascular: Positive for chest pain. Negative for palpitations and leg swelling.  Gastrointestinal: Negative for nausea, vomiting, abdominal pain, diarrhea, constipation and abdominal distention.  Genitourinary: Negative for dysuria and flank pain.  Musculoskeletal: Positive for myalgias and arthralgias.  Skin: Negative for rash.  Neurological: Positive for weakness. Negative for dizziness.  Hematological: Negative for adenopathy.   BP 134/85  Pulse 90  Temp(Src) 98.2 F (36.8 C) (Oral)  Resp 16  Ht 5\' 4"  (1.626 m)  Wt 170 lb 12 oz (77.452 kg)  BMI 29.31 kg/m2  SpO2 96%     Objective:   Physical Exam  Constitutional: She is oriented to person, place, and time. She appears well-developed and well-nourished. No distress.  HENT:  Head: Normocephalic and atraumatic.  Right Ear: Hearing, tympanic membrane, external ear and ear canal normal.  Left Ear: Hearing, tympanic membrane,  external ear and ear canal normal.  Nose: Nose normal.  Mouth/Throat: Posterior oropharyngeal erythema present. No posterior oropharyngeal edema. Oropharyngeal exudate: consistent with thrush.  Eyes: Conjunctivae and EOM are normal. Pupils are equal, round, and reactive to light. Right eye exhibits no discharge. Left eye exhibits no discharge. No scleral icterus.  Neck: Normal range of motion. Neck supple. No tracheal deviation present. No thyromegaly present.  Cardiovascular: Normal rate, regular rhythm and normal heart sounds.  Exam reveals no gallop and no  friction rub.   No murmur heard. Pulmonary/Chest: Effort normal. No accessory muscle usage or stridor. Not tachypneic. No respiratory distress. She has no wheezes. She has no rhonchi. She has rales in the right middle field and the left lower field.  Abdominal: Soft. She exhibits no distension.  Musculoskeletal: Normal range of motion.  Lymphadenopathy:    She has no cervical adenopathy.  Neurological: She is alert and oriented to person, place, and time. No cranial nerve deficit. Coordination normal.  Skin: Skin is warm and dry. No rash noted. She is not diaphoretic. No erythema. No pallor.  Psychiatric: She has a normal mood and affect. Her behavior is normal. Judgment and thought content normal.          Assessment & Plan:  Pneumonia:  Suggested by history and symptoms. Some improvement noted; given her immunocompromised state and concurrent sinus symptoms will extend abx use to 2 weeks.,  Refill azithromycin, add mucinex and saline lavage.   Hypothyroid:  Last TSH was elevated.  Since she reports chronoic fatigue. will increase thyroid dose to 150 mcg and recheck level in 6 weeks.

## 2011-07-04 ENCOUNTER — Ambulatory Visit (INDEPENDENT_AMBULATORY_CARE_PROVIDER_SITE_OTHER): Payer: Medicare Other | Admitting: *Deleted

## 2011-07-04 DIAGNOSIS — Z23 Encounter for immunization: Secondary | ICD-10-CM

## 2011-08-24 ENCOUNTER — Ambulatory Visit (INDEPENDENT_AMBULATORY_CARE_PROVIDER_SITE_OTHER): Payer: Medicare Other | Admitting: Internal Medicine

## 2011-08-24 ENCOUNTER — Encounter: Payer: Self-pay | Admitting: Internal Medicine

## 2011-08-24 DIAGNOSIS — L930 Discoid lupus erythematosus: Secondary | ICD-10-CM

## 2011-08-24 DIAGNOSIS — R5383 Other fatigue: Secondary | ICD-10-CM

## 2011-08-24 DIAGNOSIS — E039 Hypothyroidism, unspecified: Secondary | ICD-10-CM

## 2011-08-24 DIAGNOSIS — R5381 Other malaise: Secondary | ICD-10-CM

## 2011-08-24 DIAGNOSIS — R0602 Shortness of breath: Secondary | ICD-10-CM

## 2011-08-24 DIAGNOSIS — IMO0001 Reserved for inherently not codable concepts without codable children: Secondary | ICD-10-CM

## 2011-08-24 DIAGNOSIS — I872 Venous insufficiency (chronic) (peripheral): Secondary | ICD-10-CM

## 2011-08-24 LAB — CBC WITH DIFFERENTIAL/PLATELET
Basophils Absolute: 0.1 10*3/uL (ref 0.0–0.1)
Eosinophils Absolute: 0.1 10*3/uL (ref 0.0–0.7)
Eosinophils Relative: 2.2 % (ref 0.0–5.0)
Lymphocytes Relative: 31.1 % (ref 12.0–46.0)
Lymphs Abs: 1.7 10*3/uL (ref 0.7–4.0)
MCHC: 33 g/dL (ref 30.0–36.0)
MCV: 94.6 fl (ref 78.0–100.0)
Monocytes Absolute: 0.5 10*3/uL (ref 0.1–1.0)
Monocytes Relative: 9.4 % (ref 3.0–12.0)
Neutro Abs: 3.1 10*3/uL (ref 1.4–7.7)
RDW: 14.8 % — ABNORMAL HIGH (ref 11.5–14.6)

## 2011-08-24 LAB — CK: Total CK: 154 U/L (ref 7–177)

## 2011-08-24 LAB — COMPREHENSIVE METABOLIC PANEL
ALT: 15 U/L (ref 0–35)
Alkaline Phosphatase: 48 U/L (ref 39–117)
CO2: 27 mEq/L (ref 19–32)
Chloride: 107 mEq/L (ref 96–112)
GFR: 47.04 mL/min — ABNORMAL LOW (ref >60.00)
Sodium: 143 mEq/L (ref 135–145)

## 2011-08-24 NOTE — Progress Notes (Signed)
Subjective:    Patient ID: Diana [name], female    DOB: 1934/11/13, 76 y.o.   MRN: [id number]  HPI  76 yo wf woth history of SLE, on Cellcept and prednisone. Hypothyroidisim, treated for pneumonia in August/Sept with extended course of azithromcyin.  Returns for regular followup. Has retired from Nursing.  Not sleeping well or feeling well, which has become a chronic complaint,  Aggravated by son's recent diagnosis of lung cancer, although he received good news post lobectomy that he does not need additional treatment.  She has seen her pulmonolgoiost recently for followup on pulmonary hypertension and reports that her pressures have improved with use of Tracleer; howevere, clinically she feels no better due to persistent LE weakness which caused her to be fearful falling and therefore less physically active,  She is gaining weight and not exercising due to leg weakness..  She denies joint pain except for the PIP on her right  middle finger.  She notes redness to both LE without signs of edema.   Past Medical History  Diagnosis Date  . Lupus (systemic lupus erythematosus)   . Pulmonary hypertension   . Pneumonia   . Unspecified menopausal and postmenopausal disorder   . Primary pulmonary hypertension   . Acute glomerulonephritis with other specified pathological lesion in kidney in disease classified elsewhere   . Unspecified hypothyroidism    Current Outpatient Prescriptions on File Prior to Visit  Medication Sig Dispense Refill  . aspirin 81 MG tablet Take 81 mg by mouth daily.        Marland Kitchen bosentan (TRACLEER) 125 MG tablet Take 125 mg by mouth 2 (two) times daily.        . cyanocobalamin (,VITAMIN B-12,) 1000 MCG/ML injection Inject 1,000 mcg into the muscle every 30 (thirty) days.        Marland Kitchen estrogen, conjugated,-medroxyprogesterone (PREMPRO) 0.45-1.5 MG per tablet Take 1 tablet by mouth daily.        . hydroxychloroquine (PLAQUENIL) 200 MG tablet Take 200 mg by mouth daily.        Marland Kitchen  levothyroxine (SYNTHROID, LEVOTHROID) 125 MCG tablet Take 125 mcg by mouth daily.        . mycophenolate (CELLCEPT) 250 MG capsule Take 1 mg by mouth. 3 capsules two times a day      . omeprazole (PRILOSEC) 20 MG capsule Take 40 mg by mouth daily.       . predniSONE (DELTASONE) 10 MG tablet Take 1 tablet (10 mg total) by mouth daily.  30 tablet  3  . vitamin D, CHOLECALCIFEROL, 400 UNITS tablet Take 400 Units by mouth daily.          Review of Systems  Constitutional: Positive for activity change and unexpected weight change. Negative for fever and chills.  HENT: Negative for hearing loss, ear pain, nosebleeds, congestion, sore throat, facial swelling, rhinorrhea, sneezing, mouth sores, trouble swallowing, neck pain, neck stiffness, voice change, postnasal drip, sinus pressure, tinnitus and ear discharge.   Eyes: Negative for pain, discharge, redness and visual disturbance.  Respiratory: Negative for cough, chest tightness, shortness of breath, wheezing and stridor.   Cardiovascular: Negative for chest pain, palpitations and leg swelling.  Musculoskeletal: Negative for myalgias and arthralgias.  Skin: Negative for color change and rash.  Neurological: Positive for weakness. Negative for dizziness, light-headedness and headaches.  Hematological: Negative for adenopathy.       Objective:   Physical Exam  Constitutional: She is oriented to person, place, and time. She appears well-developed  and well-nourished.  HENT:  Mouth/Throat: Oropharynx is clear and moist.  Eyes: EOM are normal. Pupils are equal, round, and reactive to light. No scleral icterus.  Neck: Normal range of motion. Neck supple. No JVD present. No thyromegaly present.  Cardiovascular: Normal rate, regular rhythm, normal heart sounds and intact distal pulses.   Pulmonary/Chest: Effort normal and breath sounds normal.  Abdominal: Soft. Bowel sounds are normal. She exhibits no mass. There is no tenderness.  Musculoskeletal:  Normal range of motion. She exhibits no edema.  Lymphadenopathy:    She has no cervical adenopathy.  Neurological: She is alert and oriented to person, place, and time.  Skin: Skin is warm and dry.  Psychiatric: She has a normal mood and affect.          Assessment & Plan:

## 2011-08-24 NOTE — Patient Instructions (Signed)
Please consider allowing me to refer you to [place] rehab for exercise progeam  consider wearing light weight compression knee highs  To protect your veins,  8 -15 mm hg

## 2011-08-26 ENCOUNTER — Other Ambulatory Visit: Payer: Self-pay | Admitting: Internal Medicine

## 2011-08-26 MED ORDER — RANITIDINE HCL 150 MG PO TABS: 150.0000 mg | ORAL_TABLET | Freq: Every day | ORAL | Status: DC

## 2011-08-26 MED ORDER — LEVOTHYROXINE SODIUM 125 MCG PO TABS: 125.0000 ug | ORAL_TABLET | Freq: Every day | ORAL | Status: DC

## 2011-08-26 NOTE — Assessment & Plan Note (Signed)
Chronic, secondary to pulmonary hypertension complicated by progressive deconditioning.  I have strongly recommended the Cardiopulmonary Rehab program at [place] but she continues to defer for the present time.

## 2011-08-26 NOTE — Assessment & Plan Note (Addendum)
Diagnosis made by prior PCP, Sam cykert, with nephrology followup at [facility]  Continue cellcept and plaquenil

## 2011-08-26 NOTE — Assessment & Plan Note (Signed)
Her dose was adjusted in September and repeat TSH is < 2.0 currently no chnages to current dose.

## 2011-09-08 ENCOUNTER — Telehealth: Payer: Self-pay | Admitting: *Deleted

## 2011-09-08 MED ORDER — LEVOTHYROXINE SODIUM 150 MCG PO TABS: 150.0000 ug | ORAL_TABLET | Freq: Every day | ORAL | Status: DC

## 2011-09-08 NOTE — Telephone Encounter (Signed)
Rx sent to pharmacy   

## 2011-09-08 NOTE — Telephone Encounter (Signed)
Ok to [facility]  #30 with 5 refills

## 2011-09-08 NOTE — Telephone Encounter (Signed)
Refill on synthroid 150mg 

## 2011-10-03 DIAGNOSIS — R0602 Shortness of breath: Secondary | ICD-10-CM | POA: Diagnosis not present

## 2011-10-03 DIAGNOSIS — J209 Acute bronchitis, unspecified: Secondary | ICD-10-CM | POA: Diagnosis not present

## 2011-10-03 DIAGNOSIS — I5040 Unspecified combined systolic (congestive) and diastolic (congestive) heart failure: Secondary | ICD-10-CM | POA: Diagnosis not present

## 2011-10-03 DIAGNOSIS — Z79899 Other long term (current) drug therapy: Secondary | ICD-10-CM | POA: Diagnosis not present

## 2011-10-03 DIAGNOSIS — I279 Pulmonary heart disease, unspecified: Secondary | ICD-10-CM | POA: Diagnosis not present

## 2011-10-03 DIAGNOSIS — M329 Systemic lupus erythematosus, unspecified: Secondary | ICD-10-CM | POA: Diagnosis not present

## 2011-10-25 ENCOUNTER — Encounter: Payer: Self-pay | Admitting: Internal Medicine

## 2011-10-25 DIAGNOSIS — Z124 Encounter for screening for malignant neoplasm of cervix: Secondary | ICD-10-CM | POA: Diagnosis not present

## 2011-10-25 DIAGNOSIS — Z79890 Hormone replacement therapy (postmenopausal): Secondary | ICD-10-CM | POA: Diagnosis not present

## 2011-10-25 DIAGNOSIS — M949 Disorder of cartilage, unspecified: Secondary | ICD-10-CM | POA: Diagnosis not present

## 2011-10-25 DIAGNOSIS — N951 Menopausal and female climacteric states: Secondary | ICD-10-CM | POA: Diagnosis not present

## 2011-10-25 DIAGNOSIS — M899 Disorder of bone, unspecified: Secondary | ICD-10-CM | POA: Diagnosis not present

## 2011-10-25 DIAGNOSIS — Z0000 Encounter for general adult medical examination without abnormal findings: Secondary | ICD-10-CM | POA: Diagnosis not present

## 2011-11-01 ENCOUNTER — Telehealth: Payer: Self-pay | Admitting: *Deleted

## 2011-11-01 DIAGNOSIS — R5383 Other fatigue: Secondary | ICD-10-CM

## 2011-11-01 DIAGNOSIS — E039 Hypothyroidism, unspecified: Secondary | ICD-10-CM

## 2011-11-01 NOTE — Telephone Encounter (Signed)
tsh and placed in EPIC

## 2011-11-01 NOTE — Telephone Encounter (Signed)
Pt would like to come in to have her thyroid labs checked.  She thinks her levels are off because she feels nervous and is having difficulty sleeping.

## 2011-11-01 NOTE — Telephone Encounter (Signed)
Lab appt scheduled.

## 2011-11-02 ENCOUNTER — Other Ambulatory Visit: Payer: Medicare Other

## 2011-11-03 ENCOUNTER — Other Ambulatory Visit (INDEPENDENT_AMBULATORY_CARE_PROVIDER_SITE_OTHER): Payer: Medicare Other | Admitting: *Deleted

## 2011-11-03 DIAGNOSIS — E039 Hypothyroidism, unspecified: Secondary | ICD-10-CM

## 2011-11-03 LAB — TSH: TSH: 3.66 u[IU]/mL (ref 0.35–5.50)

## 2011-11-08 ENCOUNTER — Ambulatory Visit (INDEPENDENT_AMBULATORY_CARE_PROVIDER_SITE_OTHER): Payer: Medicare Other | Admitting: Internal Medicine

## 2011-11-08 ENCOUNTER — Encounter: Payer: Self-pay | Admitting: Internal Medicine

## 2011-11-08 VITALS — BP 120/80 | HR 84 | Temp 98.6°F | Wt 173.0 lb

## 2011-11-08 DIAGNOSIS — G4700 Insomnia, unspecified: Secondary | ICD-10-CM

## 2011-11-08 DIAGNOSIS — M6281 Muscle weakness (generalized): Secondary | ICD-10-CM

## 2011-11-08 DIAGNOSIS — E538 Deficiency of other specified B group vitamins: Secondary | ICD-10-CM | POA: Diagnosis not present

## 2011-11-08 DIAGNOSIS — R0609 Other forms of dyspnea: Secondary | ICD-10-CM

## 2011-11-08 DIAGNOSIS — E162 Hypoglycemia, unspecified: Secondary | ICD-10-CM | POA: Diagnosis not present

## 2011-11-08 DIAGNOSIS — R0600 Dyspnea, unspecified: Secondary | ICD-10-CM

## 2011-11-08 DIAGNOSIS — R0989 Other specified symptoms and signs involving the circulatory and respiratory systems: Secondary | ICD-10-CM | POA: Diagnosis not present

## 2011-11-08 LAB — HEMOGLOBIN A1C: Hgb A1c MFr Bld: 6.9 % — ABNORMAL HIGH (ref 4.6–6.5)

## 2011-11-08 MED ORDER — CYANOCOBALAMIN 1000 MCG/ML IJ SOLN: 1000.0000 ug | INTRAMUSCULAR | Status: DC

## 2011-11-08 NOTE — Progress Notes (Signed)
Subjective:    Patient ID: Diana [name], female    DOB: 25-Mar-1935, 77 y.o.   MRN: [id number]  HPI  Diana [name] is a 77 yr old female with a history of SLE and mild pulmonary hypertension, on Tracleer who presents with worsening dyspnea accompanied by rapid heart rate with exertion accompanied by muscle weakness.  Diana [name] has had to stop when Diana [name] is drying her hair to rest. Diana [name] has deferred referral to cardiopulmonary PT repeatedly but has not initiated her own exercise plan and now plans to join the [facility]. Her last ECHO and exercise stress test was several years ago, during hospitalization for pneumonia.    Past Medical History  Diagnosis Date  . Lupus (systemic lupus erythematosus)   . Pulmonary hypertension   . Pneumonia   . Unspecified menopausal and postmenopausal disorder   . Primary pulmonary hypertension   . Acute glomerulonephritis with other specified pathological lesion in kidney in disease classified elsewhere   . Unspecified hypothyroidism    Current Outpatient Prescriptions on File Prior to Visit  Medication Sig Dispense Refill  . aspirin 81 MG tablet Take 81 mg by mouth daily.        Marland Kitchen bosentan (TRACLEER) 125 MG tablet Take 125 mg by mouth 2 (two) times daily.        Marland Kitchen estrogen, conjugated,-medroxyprogesterone (PREMPRO) 0.45-1.5 MG per tablet Take 1 tablet by mouth daily.        . hydroxychloroquine (PLAQUENIL) 200 MG tablet Take 200 mg by mouth daily.        Marland Kitchen levothyroxine (SYNTHROID, LEVOTHROID) 150 MCG tablet Take 1 tablet (150 mcg total) by mouth daily.  30 tablet  5  . mycophenolate (CELLCEPT) 250 MG capsule Take 1 mg by mouth. 3 capsules two times a day      . omeprazole (PRILOSEC) 20 MG capsule Take 40 mg by mouth daily.       . ranitidine (ZANTAC) 150 MG tablet Take 1 tablet (150 mg total) by mouth at bedtime.  30 tablet  5  . vitamin D, CHOLECALCIFEROL, 400 UNITS tablet Take 400 Units by mouth daily.        . predniSONE (DELTASONE) 10 MG tablet Take 1 tablet (10 mg  total) by mouth daily.  30 tablet  3    Review of Systems  Constitutional: Negative for fever, chills and unexpected weight change.  HENT: Negative for hearing loss, ear pain, nosebleeds, congestion, sore throat, facial swelling, rhinorrhea, sneezing, mouth sores, trouble swallowing, neck pain, neck stiffness, voice change, postnasal drip, sinus pressure, tinnitus and ear discharge.   Eyes: Negative for pain, discharge, redness and visual disturbance.  Respiratory: Positive for shortness of breath. Negative for cough, chest tightness, wheezing and stridor.   Cardiovascular: Negative for chest pain, palpitations and leg swelling.  Musculoskeletal: Negative for myalgias and arthralgias.  Skin: Negative for color change and rash.  Neurological: Positive for weakness. Negative for dizziness, light-headedness and headaches.  Hematological: Negative for adenopathy.  Psychiatric/Behavioral: Positive for sleep disturbance. The patient is nervous/anxious.    BP 120/80  Pulse 84  Temp(Src) 98.6 F (37 C) (Oral)  Wt 173 lb (78.472 kg)  SpO2 96%     Objective:   Physical Exam  Constitutional: Diana [name] is oriented to person, place, and time. Diana [name] appears well-developed and well-nourished.  HENT:  Mouth/Throat: Oropharynx is clear and moist.  Eyes: EOM are normal. Pupils are equal, round, and reactive to light. No scleral icterus.  Neck: Normal range of motion. Neck  supple. No JVD present. No thyromegaly present.  Cardiovascular: Normal rate, regular rhythm, normal heart sounds and intact distal pulses.   Pulmonary/Chest: Effort normal and breath sounds normal.  Abdominal: Soft. Bowel sounds are normal. Diana [name] exhibits no mass. There is no tenderness.  Musculoskeletal: Normal range of motion. Diana [name] exhibits no edema.  Lymphadenopathy:    Diana [name] has no cervical adenopathy.  Neurological: Diana [name] is alert and oriented to person, place, and time.  Skin: Skin is warm and dry.  Psychiatric: Diana [name] has a normal mood  and affect.    ambulatory sats remained 94 to 96%  And pulse remained <100    Assessment & Plan:   Dyspnea on exertion Shde has been increasingly less active foe the last two years despite management of pulmonary hypertension with Tracleer.  Her ambulatory sats remain normal and her pulse remains < 100.  Will refer to cardiology for stress testing and/or ECHO given her continued complaints .    Updated Medication List Outpatient Encounter Prescriptions as of 11/08/2011  Medication Sig Dispense Refill  . aspirin 81 MG tablet Take 81 mg by mouth daily.        Marland Kitchen bosentan (TRACLEER) 125 MG tablet Take 125 mg by mouth 2 (two) times daily.        . cyanocobalamin (,VITAMIN B-12,) 1000 MCG/ML injection Inject 1 mL (1,000 mcg total) into the muscle every 30 (thirty) days.  10 mL  1  . estrogen, conjugated,-medroxyprogesterone (PREMPRO) 0.45-1.5 MG per tablet Take 1 tablet by mouth daily.        . hydroxychloroquine (PLAQUENIL) 200 MG tablet Take 200 mg by mouth daily.        Marland Kitchen levothyroxine (SYNTHROID, LEVOTHROID) 150 MCG tablet Take 1 tablet (150 mcg total) by mouth daily.  30 tablet  5  . mycophenolate (CELLCEPT) 250 MG capsule Take 1 mg by mouth. 3 capsules two times a day      . omeprazole (PRILOSEC) 20 MG capsule Take 40 mg by mouth daily.       Marland Kitchen PREDNISONE PO Take 8 mg's by mouth daily      . ranitidine (ZANTAC) 150 MG tablet Take 1 tablet (150 mg total) by mouth at bedtime.  30 tablet  5  . vitamin D, CHOLECALCIFEROL, 400 UNITS tablet Take 400 Units by mouth daily.        Marland Kitchen DISCONTD: cyanocobalamin (,VITAMIN B-12,) 1000 MCG/ML injection Inject 1,000 mcg into the muscle every 30 (thirty) days.        . predniSONE (DELTASONE) 10 MG tablet Take 1 tablet (10 mg total) by mouth daily.  30 tablet  3

## 2011-11-08 NOTE — Assessment & Plan Note (Addendum)
Shde has been increasingly less active foe the last two years despite management of pulmonary hypertension with Tracleer.  Her ambulatory sats remain normal and her pulse remains < 100.  Will refer to cardiology for stress testing and/or ECHO given her continued complaints .

## 2011-11-09 LAB — CK TOTAL AND CKMB (NOT AT ARMC)
CK, MB: 3.3 ng/mL (ref 0.3–4.0)
Relative Index: 1.9 (ref 0.0–2.5)

## 2011-11-18 DIAGNOSIS — B0223 Postherpetic polyneuropathy: Secondary | ICD-10-CM

## 2011-11-18 HISTORY — DX: Postherpetic polyneuropathy: B02.23

## 2011-12-07 ENCOUNTER — Encounter: Payer: Self-pay | Admitting: Cardiovascular Disease

## 2011-12-07 ENCOUNTER — Ambulatory Visit (INDEPENDENT_AMBULATORY_CARE_PROVIDER_SITE_OTHER): Payer: Medicare Other | Admitting: Cardiovascular Disease

## 2011-12-07 VITALS — BP 131/86 | HR 93 | Ht 64.0 in | Wt 173.0 lb

## 2011-12-07 DIAGNOSIS — R0609 Other forms of dyspnea: Secondary | ICD-10-CM | POA: Diagnosis not present

## 2011-12-07 DIAGNOSIS — I2789 Other specified pulmonary heart diseases: Secondary | ICD-10-CM | POA: Diagnosis not present

## 2011-12-07 DIAGNOSIS — R0602 Shortness of breath: Secondary | ICD-10-CM | POA: Diagnosis not present

## 2011-12-07 DIAGNOSIS — I272 Pulmonary hypertension, unspecified: Secondary | ICD-10-CM

## 2011-12-07 DIAGNOSIS — R0989 Other specified symptoms and signs involving the circulatory and respiratory systems: Secondary | ICD-10-CM | POA: Diagnosis not present

## 2011-12-07 MED ORDER — METOPROLOL SUCCINATE ER 25 MG PO TB24: 25.0000 mg | ORAL_TABLET | Freq: Every day | ORAL | Status: AC

## 2011-12-07 NOTE — Patient Instructions (Signed)
You are doing well. Please start metoprolol 1/2 pill in the PM for fast heart rate/palpitations  Please call us if you have new issues that need to be addressed before your next appt.  Your physician wants you to follow-up in: 12 months.  You will receive a reminder letter in the mail two months in advance. If you don't receive a letter, please call our office to schedule the follow-up appointment.

## 2011-12-07 NOTE — Assessment & Plan Note (Addendum)
Mild shortness breath with exertion likely secondary to underlying pulmonary hypertension, lupus, remote history of smoking, possible COPD. We have suggested she increase her exercise given underlying deconditioning.  Underlying tachycardia could be contributing to her shortness of breath and we will start her on low-dose metoprolol succinate. She prefers taking a medication once a day rather than twice a day. We have suggested she try treat the metoprolol succinate from a half pill possibly to a full pill as tolerated.   We have asked her to stay in close contact with our office with blood pressure and heart rate numbers over the next several weeks. If she has no additional improvement in her tachycardia and palpitations in the evening, an alternate medication could be used.   She feels that her shortness of breath is at her baseline and is actually quite stable. She prefers to wait on any stress testing. She did have an echocardiogram done 6 months ago and we will try to obtain the result of his records. She reports that her pulmonary pressures than before. Lasix to be used when necessary for worsening symptoms or shortness of breath.

## 2011-12-07 NOTE — Progress Notes (Addendum)
Patient ID: Diana [name], female    DOB: [birth year], 77 y.o.   MRN: [id number]  HPI Comments: Diana [name] is a very pleasant 77 year old woman, patient of Dr. Darrick Huntsman and Dr. Welton Flakes (pulmonary), who presents by referral evaluation of tachycardia and shortness of breath. She has a diagnosis of pulmonary hypertension and lupus. She has been on a long steady course of prednisone. She reports having underlying renal problems and has seen Dr. Leandrew Koyanagi at [facility].  She had an echocardiogram done last year, approximately 6 months ago by Dr. Welton Flakes that she reported showed some improvement of her pulmonary pressures. She reports being stable on Tracleer with better exercise tolerance and shortness of breath. She has never been on a diuretic on a regular basis for her symptoms. She rarely has lower extremity edema. She denies any PND or orthopnea.   Her biggest complaint is some tachycardia and palpitations at nighttime when she is getting ready for bed. She wears oxygen at nighttime when she sleeps at that time has no problems. When she is getting ready for bed, she does not wear oxygen and she does not wear supplemental oxygen in the day. She does have rare palpitations in the daytime, mostly at night. Her baseline heart rate is typically in the 90s on a regular basis. She does not exercise on a regular basis though she would like to start. She feels that she is deconditioned  exercise stress test  several years ago EKG shows normal sinus rhythm with rate 87 beats for minute with no significant ST-T wave changes   Outpatient Encounter Prescriptions as of 12/07/2011  Medication Sig Dispense Refill  . aspirin 81 MG tablet Take 81 mg by mouth daily.        Marland Kitchen bosentan (TRACLEER) 125 MG tablet Take 125 mg by mouth 2 (two) times daily.        . cyanocobalamin (,VITAMIN B-12,) 1000 MCG/ML injection Inject 1 mL (1,000 mcg total) into the muscle every 30 (thirty) days.  10 mL  1  . estrogen,  conjugated,-medroxyprogesterone (PREMPRO) 0.45-1.5 MG per tablet Take 1 tablet by mouth daily.        . hydroxychloroquine (PLAQUENIL) 200 MG tablet Take 200 mg by mouth daily.        Marland Kitchen levothyroxine (SYNTHROID, LEVOTHROID) 150 MCG tablet Take 1 tablet (150 mcg total) by mouth daily.  30 tablet  5  . loratadine (CLARITIN) 10 MG tablet Take 10 mg by mouth as needed.      . mycophenolate (CELLCEPT) 250 MG capsule Take 1 mg by mouth. 3 capsules two times a day      . omeprazole (PRILOSEC) 20 MG capsule Take 40 mg by mouth daily.       Marland Kitchen PREDNISONE PO Take 7 mg by mouth daily.       . ranitidine (ZANTAC) 150 MG tablet Take 1 tablet (150 mg total) by mouth at bedtime.  30 tablet  5  . vitamin D, CHOLECALCIFEROL, 400 UNITS tablet Take 400 Units by mouth daily.         Review of Systems  Constitutional: Negative.   HENT: Negative.   Eyes: Negative.   Respiratory: Positive for shortness of breath.   Cardiovascular: Positive for palpitations.  Gastrointestinal: Negative.   Musculoskeletal: Negative.   Skin: Negative.   Neurological: Negative.   Hematological: Negative.   Psychiatric/Behavioral: Negative.   All other systems reviewed and are negative.    BP 131/86  Pulse 93  Ht 5'  4" (1.626 m)  Wt 173 lb (78.472 kg)  BMI 29.70 kg/m2  Physical Exam  Nursing note and vitals reviewed. Constitutional: She is oriented to person, place, and time. She appears well-developed and well-nourished.  HENT:  Head: Normocephalic.  Nose: Nose normal.  Mouth/Throat: Oropharynx is clear and moist.  Eyes: Conjunctivae are normal. Pupils are equal, round, and reactive to light.  Neck: Normal range of motion. Neck supple. No JVD present.  Cardiovascular: Normal rate, regular rhythm, S1 normal, S2 normal, normal heart sounds and intact distal pulses.  Exam reveals no gallop and no friction rub.   No murmur heard. Pulmonary/Chest: Effort normal and breath sounds normal. No respiratory distress. She has no  wheezes. She has no rales. She exhibits no tenderness.  Abdominal: Soft. Bowel sounds are normal. She exhibits no distension. There is no tenderness.  Musculoskeletal: Normal range of motion. She exhibits no edema and no tenderness.  Lymphadenopathy:    She has no cervical adenopathy.  Neurological: She is alert and oriented to person, place, and time. Coordination normal.  Skin: Skin is warm and dry. No rash noted. No erythema.  Psychiatric: She has a normal mood and affect. Her behavior is normal. Judgment and thought content normal.         Assessment and Plan      Addendum; echocardiogram dated 05/20/2011; Ejection fraction estimated 45-50%, grade 1 diastolic dysfunction, no evidence of pulmonary hypertension, mild left atrial enlargement, mild aortic, tricuspid, mitral regurgitation. Study was read by outside facility. Images not available

## 2011-12-13 DIAGNOSIS — M159 Polyosteoarthritis, unspecified: Secondary | ICD-10-CM | POA: Diagnosis not present

## 2011-12-13 DIAGNOSIS — M3500 Sicca syndrome, unspecified: Secondary | ICD-10-CM | POA: Diagnosis not present

## 2011-12-13 DIAGNOSIS — M329 Systemic lupus erythematosus, unspecified: Secondary | ICD-10-CM | POA: Diagnosis not present

## 2011-12-13 DIAGNOSIS — Z79899 Other long term (current) drug therapy: Secondary | ICD-10-CM | POA: Diagnosis not present

## 2011-12-13 DIAGNOSIS — M810 Age-related osteoporosis without current pathological fracture: Secondary | ICD-10-CM | POA: Diagnosis not present

## 2011-12-28 DIAGNOSIS — L821 Other seborrheic keratosis: Secondary | ICD-10-CM | POA: Diagnosis not present

## 2011-12-28 DIAGNOSIS — M159 Polyosteoarthritis, unspecified: Secondary | ICD-10-CM | POA: Diagnosis not present

## 2011-12-28 DIAGNOSIS — L7211 Pilar cyst: Secondary | ICD-10-CM | POA: Diagnosis not present

## 2012-01-11 DIAGNOSIS — I5040 Unspecified combined systolic (congestive) and diastolic (congestive) heart failure: Secondary | ICD-10-CM | POA: Diagnosis not present

## 2012-01-11 DIAGNOSIS — M329 Systemic lupus erythematosus, unspecified: Secondary | ICD-10-CM | POA: Diagnosis not present

## 2012-01-11 DIAGNOSIS — R0602 Shortness of breath: Secondary | ICD-10-CM | POA: Diagnosis not present

## 2012-01-11 DIAGNOSIS — I279 Pulmonary heart disease, unspecified: Secondary | ICD-10-CM | POA: Diagnosis not present

## 2012-01-16 ENCOUNTER — Other Ambulatory Visit: Payer: Self-pay | Admitting: Internal Medicine

## 2012-01-18 ENCOUNTER — Telehealth: Payer: Self-pay | Admitting: Internal Medicine

## 2012-01-18 NOTE — Telephone Encounter (Signed)
Returned your call you can call [phone] she will be there till 11 or after 1:30 [phone]

## 2012-01-18 NOTE — Telephone Encounter (Signed)
I talked to patient, she did not need the refill that was requested from the pharmacy.

## 2012-01-27 DIAGNOSIS — R0602 Shortness of breath: Secondary | ICD-10-CM | POA: Diagnosis not present

## 2012-02-16 DIAGNOSIS — M5430 Sciatica, unspecified side: Secondary | ICD-10-CM | POA: Diagnosis not present

## 2012-02-16 DIAGNOSIS — M1990 Unspecified osteoarthritis, unspecified site: Secondary | ICD-10-CM | POA: Diagnosis not present

## 2012-02-16 DIAGNOSIS — J8410 Pulmonary fibrosis, unspecified: Secondary | ICD-10-CM | POA: Diagnosis not present

## 2012-02-16 DIAGNOSIS — M329 Systemic lupus erythematosus, unspecified: Secondary | ICD-10-CM | POA: Diagnosis not present

## 2012-03-01 DIAGNOSIS — M47817 Spondylosis without myelopathy or radiculopathy, lumbosacral region: Secondary | ICD-10-CM | POA: Diagnosis not present

## 2012-03-01 DIAGNOSIS — M1610 Unilateral primary osteoarthritis, unspecified hip: Secondary | ICD-10-CM | POA: Diagnosis not present

## 2012-03-01 DIAGNOSIS — M76899 Other specified enthesopathies of unspecified lower limb, excluding foot: Secondary | ICD-10-CM | POA: Diagnosis not present

## 2012-03-07 DIAGNOSIS — Z09 Encounter for follow-up examination after completed treatment for conditions other than malignant neoplasm: Secondary | ICD-10-CM | POA: Diagnosis not present

## 2012-03-07 DIAGNOSIS — M329 Systemic lupus erythematosus, unspecified: Secondary | ICD-10-CM | POA: Diagnosis not present

## 2012-03-08 ENCOUNTER — Ambulatory Visit: Payer: Self-pay | Admitting: Obstetrics and Gynecology

## 2012-03-08 DIAGNOSIS — Z1231 Encounter for screening mammogram for malignant neoplasm of breast: Secondary | ICD-10-CM | POA: Diagnosis not present

## 2012-04-12 ENCOUNTER — Other Ambulatory Visit: Payer: Self-pay | Admitting: Diagnostic Radiology

## 2012-04-12 ENCOUNTER — Other Ambulatory Visit: Payer: Self-pay | Admitting: Internal Medicine

## 2012-04-12 DIAGNOSIS — R220 Localized swelling, mass and lump, head: Secondary | ICD-10-CM | POA: Diagnosis not present

## 2012-04-12 DIAGNOSIS — R221 Localized swelling, mass and lump, neck: Secondary | ICD-10-CM | POA: Diagnosis not present

## 2012-04-12 DIAGNOSIS — Z1389 Encounter for screening for other disorder: Secondary | ICD-10-CM | POA: Diagnosis not present

## 2012-04-12 LAB — CREATININE, SERUM: EGFR (Non-African Amer.): 42 — ABNORMAL LOW

## 2012-04-13 ENCOUNTER — Ambulatory Visit: Payer: Self-pay | Admitting: Internal Medicine

## 2012-04-13 DIAGNOSIS — R0602 Shortness of breath: Secondary | ICD-10-CM | POA: Diagnosis not present

## 2012-04-13 DIAGNOSIS — R220 Localized swelling, mass and lump, head: Secondary | ICD-10-CM | POA: Diagnosis not present

## 2012-04-13 DIAGNOSIS — K449 Diaphragmatic hernia without obstruction or gangrene: Secondary | ICD-10-CM | POA: Diagnosis not present

## 2012-04-19 DIAGNOSIS — M064 Inflammatory polyarthropathy: Secondary | ICD-10-CM | POA: Diagnosis not present

## 2012-04-19 DIAGNOSIS — M329 Systemic lupus erythematosus, unspecified: Secondary | ICD-10-CM | POA: Diagnosis not present

## 2012-04-19 DIAGNOSIS — R072 Precordial pain: Secondary | ICD-10-CM | POA: Diagnosis not present

## 2012-04-26 ENCOUNTER — Other Ambulatory Visit: Payer: Self-pay | Admitting: Internal Medicine

## 2012-04-26 MED ORDER — LEVOTHYROXINE SODIUM 150 MCG PO TABS: 150.0000 ug | ORAL_TABLET | Freq: Every day | ORAL | Status: DC

## 2012-05-03 DIAGNOSIS — M5030 Other cervical disc degeneration, unspecified cervical region: Secondary | ICD-10-CM | POA: Diagnosis not present

## 2012-05-03 DIAGNOSIS — I270 Primary pulmonary hypertension: Secondary | ICD-10-CM | POA: Diagnosis not present

## 2012-05-03 DIAGNOSIS — N039 Chronic nephritic syndrome with unspecified morphologic changes: Secondary | ICD-10-CM | POA: Diagnosis not present

## 2012-05-03 DIAGNOSIS — I779 Disorder of arteries and arterioles, unspecified: Secondary | ICD-10-CM | POA: Diagnosis not present

## 2012-05-03 DIAGNOSIS — M1990 Unspecified osteoarthritis, unspecified site: Secondary | ICD-10-CM | POA: Diagnosis not present

## 2012-05-03 DIAGNOSIS — M329 Systemic lupus erythematosus, unspecified: Secondary | ICD-10-CM | POA: Diagnosis not present

## 2012-05-03 DIAGNOSIS — E538 Deficiency of other specified B group vitamins: Secondary | ICD-10-CM | POA: Diagnosis not present

## 2012-05-03 DIAGNOSIS — K449 Diaphragmatic hernia without obstruction or gangrene: Secondary | ICD-10-CM | POA: Diagnosis not present

## 2012-05-03 DIAGNOSIS — Z803 Family history of malignant neoplasm of breast: Secondary | ICD-10-CM | POA: Diagnosis not present

## 2012-05-03 DIAGNOSIS — R0609 Other forms of dyspnea: Secondary | ICD-10-CM | POA: Diagnosis not present

## 2012-05-03 DIAGNOSIS — R209 Unspecified disturbances of skin sensation: Secondary | ICD-10-CM | POA: Diagnosis not present

## 2012-05-03 DIAGNOSIS — M7989 Other specified soft tissue disorders: Secondary | ICD-10-CM | POA: Diagnosis not present

## 2012-05-03 DIAGNOSIS — M3500 Sicca syndrome, unspecified: Secondary | ICD-10-CM | POA: Diagnosis not present

## 2012-05-03 DIAGNOSIS — D1779 Benign lipomatous neoplasm of other sites: Secondary | ICD-10-CM | POA: Diagnosis not present

## 2012-05-03 DIAGNOSIS — E039 Hypothyroidism, unspecified: Secondary | ICD-10-CM | POA: Diagnosis not present

## 2012-05-03 DIAGNOSIS — R0989 Other specified symptoms and signs involving the circulatory and respiratory systems: Secondary | ICD-10-CM | POA: Diagnosis not present

## 2012-05-03 DIAGNOSIS — I776 Arteritis, unspecified: Secondary | ICD-10-CM | POA: Diagnosis not present

## 2012-05-03 DIAGNOSIS — M5137 Other intervertebral disc degeneration, lumbosacral region: Secondary | ICD-10-CM | POA: Diagnosis not present

## 2012-05-03 DIAGNOSIS — I498 Other specified cardiac arrhythmias: Secondary | ICD-10-CM | POA: Diagnosis not present

## 2012-05-03 DIAGNOSIS — R0602 Shortness of breath: Secondary | ICD-10-CM | POA: Diagnosis not present

## 2012-05-03 DIAGNOSIS — M810 Age-related osteoporosis without current pathological fracture: Secondary | ICD-10-CM | POA: Diagnosis not present

## 2012-05-04 DIAGNOSIS — R0602 Shortness of breath: Secondary | ICD-10-CM | POA: Diagnosis not present

## 2012-05-04 DIAGNOSIS — R0609 Other forms of dyspnea: Secondary | ICD-10-CM | POA: Diagnosis not present

## 2012-05-04 DIAGNOSIS — I519 Heart disease, unspecified: Secondary | ICD-10-CM | POA: Diagnosis not present

## 2012-05-04 DIAGNOSIS — R0989 Other specified symptoms and signs involving the circulatory and respiratory systems: Secondary | ICD-10-CM | POA: Diagnosis not present

## 2012-05-04 DIAGNOSIS — K449 Diaphragmatic hernia without obstruction or gangrene: Secondary | ICD-10-CM | POA: Diagnosis not present

## 2012-05-04 DIAGNOSIS — M329 Systemic lupus erythematosus, unspecified: Secondary | ICD-10-CM | POA: Diagnosis not present

## 2012-05-04 DIAGNOSIS — I517 Cardiomegaly: Secondary | ICD-10-CM | POA: Diagnosis not present

## 2012-05-04 DIAGNOSIS — D1779 Benign lipomatous neoplasm of other sites: Secondary | ICD-10-CM | POA: Diagnosis not present

## 2012-05-04 DIAGNOSIS — I270 Primary pulmonary hypertension: Secondary | ICD-10-CM | POA: Diagnosis not present

## 2012-05-04 DIAGNOSIS — R221 Localized swelling, mass and lump, neck: Secondary | ICD-10-CM | POA: Diagnosis not present

## 2012-05-04 DIAGNOSIS — M19019 Primary osteoarthritis, unspecified shoulder: Secondary | ICD-10-CM | POA: Diagnosis not present

## 2012-05-04 DIAGNOSIS — R209 Unspecified disturbances of skin sensation: Secondary | ICD-10-CM | POA: Diagnosis not present

## 2012-05-18 DIAGNOSIS — I2789 Other specified pulmonary heart diseases: Secondary | ICD-10-CM | POA: Diagnosis not present

## 2012-05-18 DIAGNOSIS — E039 Hypothyroidism, unspecified: Secondary | ICD-10-CM | POA: Diagnosis not present

## 2012-05-18 DIAGNOSIS — K5730 Diverticulosis of large intestine without perforation or abscess without bleeding: Secondary | ICD-10-CM | POA: Diagnosis not present

## 2012-05-18 DIAGNOSIS — M329 Systemic lupus erythematosus, unspecified: Secondary | ICD-10-CM | POA: Diagnosis not present

## 2012-05-18 DIAGNOSIS — M3500 Sicca syndrome, unspecified: Secondary | ICD-10-CM | POA: Diagnosis not present

## 2012-05-18 DIAGNOSIS — Z79899 Other long term (current) drug therapy: Secondary | ICD-10-CM | POA: Diagnosis not present

## 2012-06-11 DIAGNOSIS — Z79899 Other long term (current) drug therapy: Secondary | ICD-10-CM | POA: Diagnosis not present

## 2012-06-11 DIAGNOSIS — I498 Other specified cardiac arrhythmias: Secondary | ICD-10-CM | POA: Diagnosis not present

## 2012-06-11 DIAGNOSIS — R0609 Other forms of dyspnea: Secondary | ICD-10-CM | POA: Diagnosis not present

## 2012-06-11 DIAGNOSIS — M329 Systemic lupus erythematosus, unspecified: Secondary | ICD-10-CM | POA: Diagnosis not present

## 2012-06-11 DIAGNOSIS — Z01810 Encounter for preprocedural cardiovascular examination: Secondary | ICD-10-CM | POA: Diagnosis not present

## 2012-06-11 DIAGNOSIS — Z853 Personal history of malignant neoplasm of breast: Secondary | ICD-10-CM | POA: Diagnosis not present

## 2012-06-11 DIAGNOSIS — R0602 Shortness of breath: Secondary | ICD-10-CM | POA: Diagnosis not present

## 2012-06-11 DIAGNOSIS — Z7982 Long term (current) use of aspirin: Secondary | ICD-10-CM | POA: Diagnosis not present

## 2012-06-11 DIAGNOSIS — E039 Hypothyroidism, unspecified: Secondary | ICD-10-CM | POA: Diagnosis not present

## 2012-06-11 DIAGNOSIS — M3500 Sicca syndrome, unspecified: Secondary | ICD-10-CM | POA: Diagnosis not present

## 2012-06-11 DIAGNOSIS — N183 Chronic kidney disease, stage 3 unspecified: Secondary | ICD-10-CM | POA: Diagnosis not present

## 2012-06-11 DIAGNOSIS — R0989 Other specified symptoms and signs involving the circulatory and respiratory systems: Secondary | ICD-10-CM | POA: Diagnosis not present

## 2012-07-03 DIAGNOSIS — R002 Palpitations: Secondary | ICD-10-CM | POA: Diagnosis not present

## 2012-07-03 DIAGNOSIS — R609 Edema, unspecified: Secondary | ICD-10-CM | POA: Diagnosis not present

## 2012-07-03 DIAGNOSIS — M329 Systemic lupus erythematosus, unspecified: Secondary | ICD-10-CM | POA: Diagnosis not present

## 2012-07-03 DIAGNOSIS — Z23 Encounter for immunization: Secondary | ICD-10-CM | POA: Diagnosis not present

## 2012-07-03 DIAGNOSIS — M3500 Sicca syndrome, unspecified: Secondary | ICD-10-CM | POA: Diagnosis not present

## 2012-07-03 DIAGNOSIS — I10 Essential (primary) hypertension: Secondary | ICD-10-CM | POA: Diagnosis not present

## 2012-07-03 DIAGNOSIS — Z79899 Other long term (current) drug therapy: Secondary | ICD-10-CM | POA: Diagnosis not present

## 2012-07-06 DIAGNOSIS — Z87891 Personal history of nicotine dependence: Secondary | ICD-10-CM | POA: Diagnosis not present

## 2012-07-06 DIAGNOSIS — Z7982 Long term (current) use of aspirin: Secondary | ICD-10-CM | POA: Diagnosis not present

## 2012-07-06 DIAGNOSIS — R079 Chest pain, unspecified: Secondary | ICD-10-CM | POA: Diagnosis not present

## 2012-07-06 DIAGNOSIS — R069 Unspecified abnormalities of breathing: Secondary | ICD-10-CM | POA: Diagnosis not present

## 2012-07-06 DIAGNOSIS — Z79899 Other long term (current) drug therapy: Secondary | ICD-10-CM | POA: Diagnosis not present

## 2012-07-06 DIAGNOSIS — I2789 Other specified pulmonary heart diseases: Secondary | ICD-10-CM | POA: Diagnosis not present

## 2012-07-06 DIAGNOSIS — M3500 Sicca syndrome, unspecified: Secondary | ICD-10-CM | POA: Diagnosis not present

## 2012-07-06 DIAGNOSIS — M329 Systemic lupus erythematosus, unspecified: Secondary | ICD-10-CM | POA: Diagnosis not present

## 2012-07-06 DIAGNOSIS — I270 Primary pulmonary hypertension: Secondary | ICD-10-CM | POA: Diagnosis not present

## 2012-07-06 DIAGNOSIS — E039 Hypothyroidism, unspecified: Secondary | ICD-10-CM | POA: Diagnosis not present

## 2012-07-06 DIAGNOSIS — R0609 Other forms of dyspnea: Secondary | ICD-10-CM | POA: Diagnosis not present

## 2012-07-06 DIAGNOSIS — N059 Unspecified nephritic syndrome with unspecified morphologic changes: Secondary | ICD-10-CM | POA: Diagnosis not present

## 2012-07-12 DIAGNOSIS — R0989 Other specified symptoms and signs involving the circulatory and respiratory systems: Secondary | ICD-10-CM | POA: Diagnosis not present

## 2012-07-12 DIAGNOSIS — R0609 Other forms of dyspnea: Secondary | ICD-10-CM | POA: Diagnosis not present

## 2012-07-12 DIAGNOSIS — R079 Chest pain, unspecified: Secondary | ICD-10-CM | POA: Diagnosis not present

## 2012-07-30 ENCOUNTER — Encounter: Payer: Self-pay | Admitting: Internal Medicine

## 2012-07-30 ENCOUNTER — Ambulatory Visit (INDEPENDENT_AMBULATORY_CARE_PROVIDER_SITE_OTHER): Payer: Medicare Other | Admitting: Internal Medicine

## 2012-07-30 VITALS — BP 136/78 | HR 89 | Temp 98.0°F | Resp 12 | Ht 64.0 in | Wt 168.5 lb

## 2012-07-30 DIAGNOSIS — J40 Bronchitis, not specified as acute or chronic: Secondary | ICD-10-CM

## 2012-07-30 DIAGNOSIS — L930 Discoid lupus erythematosus: Secondary | ICD-10-CM | POA: Diagnosis not present

## 2012-07-30 MED ORDER — LEVOFLOXACIN 500 MG PO TABS: 500.0000 mg | ORAL_TABLET | Freq: Every day | ORAL | Status: DC

## 2012-07-30 NOTE — Patient Instructions (Addendum)
You have a viral  Syndrome .  The post nasal drip is causing your sore throat.  Lavage your sinuses twice daily with Simply saline nasal spray.  Use benadryl 25 mg every 8 hours and Sudafed PE 10 to 30 mg every 6 hours to manage the drainage and congestion in your eye. Desma Paganini with salt water as needed for a sore throat.  Use robitussin DM (guaifenesin and Dextromethrophan ) for the thick secretionsand cough .  If your ear gets worse,  Does not improve in 3 to 4 days OR  if you develop T > 100.4,  Green nasal discharge,  Or facial pain,  Start the levaquin .

## 2012-07-30 NOTE — Progress Notes (Signed)
Patient ID: Diana [name], female   DOB: 08-31-1935, 77 y.o.   MRN: [id number]  Patient Active Problem List  Diagnosis  . Unspecified Hypothyroidism  . ANEMIA, B12 DEFICIENCY  . NEUROPATHY-PERIPHERAL  . GERD  . LUPUS ERYTHEMATOSUS  . Systemic Lupus Erythematosus  . GLUCOSE INTOLERANCE, MINIMAL, HX OF  . SHINGLES, HX OF  . TOBACCO ABUSE, HX OF  . Insomnia  . Dyspnea on exertion  . Bronchitis    Subjective:  CC:   Chief Complaint  Patient presents with  . Sinus Problem    HPI:   Diana [name] a 77 y.o. female who presents Sinus drainage and congestion with productive cough since Friday .  Using Afrin . Seems somewhat better from a sinus standpoint but feels terrible,  shakey  ,  Short of breath. Cough is nonproductive , no chest pain  But feels raw in her chest,   some  Chills and subjective fevers.   She was Seen in Sept for [facility] Pulmonology .  Medication changed from Tracleer to Adcirca last week after a right heart cath was done and PFTs were done.  Results are pending    Past Medical History  Diagnosis Date  . Lupus (systemic lupus erythematosus)   . Pulmonary hypertension   . Pneumonia   . Unspecified menopausal and postmenopausal disorder   . Primary pulmonary hypertension   . Acute glomerulonephritis with other specified pathological lesion in kidney in disease classified elsewhere   . Unspecified hypothyroidism     History reviewed. No pertinent past surgical history.       The following portions of the patient's history were reviewed and updated as appropriate: Allergies, current medications, and problem list.    Review of Systems:   12 Pt  review of systems was negative except those addressed in the HPI,     History   Social History  . Marital Status: Single    Spouse Name: N/A    Number of Children: N/A  . Years of Education: N/A   Occupational History  . 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  Smoking status: Former Smoker    Quit date: 05/25/1991  . Smokeless tobacco: Never Used  . Alcohol Use: Yes     Comment: rare  . Drug Use: No  . Sexually Active: Not on file   Other Topics Concern  . Not on file   Social History Narrative  . No narrative on file    Objective:  BP 136/78  Pulse 89  Temp 98 F (36.7 C) (Oral)  Resp 12  Ht 5\' 4"  (1.626 m)  Wt 168 lb 8 oz (76.431 kg)  BMI 28.92 kg/m2  SpO2 96%  General appearance: alert, cooperative and appears stated age Ears: normal TM's and external ear canals both ears Throat: lips, mucosa, and tongue normal; teeth and gums normal Neck: no adenopathy, no carotid bruit, supple, symmetrical, trachea midline and thyroid not enlarged, symmetric, no tenderness/mass/nodules Back: symmetric, no curvature. ROM normal. No CVA tenderness. Lungs: clear to auscultation bilaterally Heart: regular rate and rhythm, S1, S2 normal, no murmur, click, rub or gallop Abdomen: soft, non-tender; bowel sounds normal; no masses,  no organomegaly Pulses: 2+ and symmetric Skin: Skin color, texture, turgor normal. No rashes or lesions Lymph nodes: Cervical, supraclavicular, and axillary nodes normal.  Assessment and Plan:  Bronchitis Her symptoms today appear viral. I reassured her that there is no sign of  bacterial infection currently. However given your history of lupus and pulmonary hypertension I've given her prescription to take for antibiotic coverage if her symptoms worsen.  LUPUS ERYTHEMATOSUS Followed by [facility] rheumatology and pulmonology. Recent right heart catheterization was reportedly done. She is on medications for pulmonary hypertension which are being managed by them.   Updated Medication List Outpatient Encounter Prescriptions as of 07/30/2012  Medication Sig Dispense Refill  . aspirin 81 MG tablet Take 81 mg by mouth daily.        . cyanocobalamin (,VITAMIN B-12,) 1000 MCG/ML injection Inject 1 mL (1,000 mcg total) into the  muscle every 30 (thirty) days.  10 mL  1  . estrogen, conjugated,-medroxyprogesterone (PREMPRO) 0.45-1.5 MG per tablet Take 1 tablet by mouth daily.        . hydroxychloroquine (PLAQUENIL) 200 MG tablet TAKE 1 TABLET EVERY DAY  30 tablet  8  . levothyroxine (SYNTHROID, LEVOTHROID) 150 MCG tablet Take 1 tablet (150 mcg total) by mouth daily.  30 tablet  5  . loratadine (CLARITIN) 10 MG tablet Take 10 mg by mouth as needed.      . mycophenolate (CELLCEPT) 250 MG capsule Take 1 mg by mouth. 3 capsules two times a day      . omeprazole (PRILOSEC) 20 MG capsule Take 40 mg by mouth daily.       Marland Kitchen PREDNISONE PO Take 7 mg by mouth daily.       . ranitidine (ZANTAC) 150 MG tablet Take 1 tablet (150 mg total) by mouth at bedtime.  30 tablet  5  . Tadalafil, PAH, (ADCIRCA) 20 MG TABS Take by mouth 2 (two) times daily.      . vitamin D, CHOLECALCIFEROL, 400 UNITS tablet Take 400 Units by mouth daily.        Marland Kitchen bosentan (TRACLEER) 125 MG tablet Take 125 mg by mouth 2 (two) times daily.        Marland Kitchen levofloxacin (LEVAQUIN) 500 MG tablet Take 1 tablet (500 mg total) by mouth daily.  7 tablet  0  . metoprolol succinate (TOPROL-XL) 25 MG 24 hr tablet Take 1 tablet (25 mg total) by mouth daily.  30 tablet  11     No orders of the defined types were placed in this encounter.    No Follow-up on file.

## 2012-08-01 ENCOUNTER — Telehealth: Payer: Self-pay | Admitting: Internal Medicine

## 2012-08-01 ENCOUNTER — Encounter: Payer: Self-pay | Admitting: Internal Medicine

## 2012-08-01 DIAGNOSIS — J40 Bronchitis, not specified as acute or chronic: Secondary | ICD-10-CM | POA: Insufficient documentation

## 2012-08-01 NOTE — Assessment & Plan Note (Signed)
Followed by [facility] rheumatology and pulmonology. Recent right heart catheterization was reportedly done. She is on medications for pulmonary hypertension which are being managed by them.

## 2012-08-01 NOTE — Telephone Encounter (Signed)
Patient notified of lab results

## 2012-08-01 NOTE — Assessment & Plan Note (Signed)
Her symptoms today appear viral. I reassured her that there is no sign of bacterial infection currently. However given your history of lupus and pulmonary hypertension I've given her prescription to take for antibiotic coverage if her symptoms worsen.

## 2012-08-01 NOTE — Telephone Encounter (Signed)
And in preceded labs she drawn at [facility] rheumatology;  they were all normal.

## 2012-08-20 DIAGNOSIS — I279 Pulmonary heart disease, unspecified: Secondary | ICD-10-CM | POA: Diagnosis not present

## 2012-08-20 DIAGNOSIS — M329 Systemic lupus erythematosus, unspecified: Secondary | ICD-10-CM | POA: Diagnosis not present

## 2012-08-20 DIAGNOSIS — R0602 Shortness of breath: Secondary | ICD-10-CM | POA: Diagnosis not present

## 2012-08-24 DIAGNOSIS — E039 Hypothyroidism, unspecified: Secondary | ICD-10-CM | POA: Diagnosis not present

## 2012-08-24 DIAGNOSIS — N058 Unspecified nephritic syndrome with other morphologic changes: Secondary | ICD-10-CM | POA: Diagnosis not present

## 2012-08-24 DIAGNOSIS — K5730 Diverticulosis of large intestine without perforation or abscess without bleeding: Secondary | ICD-10-CM | POA: Diagnosis not present

## 2012-08-24 DIAGNOSIS — Z79899 Other long term (current) drug therapy: Secondary | ICD-10-CM | POA: Diagnosis not present

## 2012-08-24 DIAGNOSIS — I10 Essential (primary) hypertension: Secondary | ICD-10-CM | POA: Diagnosis not present

## 2012-08-24 DIAGNOSIS — M3500 Sicca syndrome, unspecified: Secondary | ICD-10-CM | POA: Diagnosis not present

## 2012-08-24 DIAGNOSIS — M129 Arthropathy, unspecified: Secondary | ICD-10-CM | POA: Diagnosis not present

## 2012-08-24 DIAGNOSIS — Z7982 Long term (current) use of aspirin: Secondary | ICD-10-CM | POA: Diagnosis not present

## 2012-08-24 DIAGNOSIS — M329 Systemic lupus erythematosus, unspecified: Secondary | ICD-10-CM | POA: Diagnosis not present

## 2012-08-24 DIAGNOSIS — I2789 Other specified pulmonary heart diseases: Secondary | ICD-10-CM | POA: Diagnosis not present

## 2012-08-30 ENCOUNTER — Encounter: Payer: Self-pay | Admitting: Internal Medicine

## 2012-09-14 ENCOUNTER — Ambulatory Visit (INDEPENDENT_AMBULATORY_CARE_PROVIDER_SITE_OTHER): Payer: Medicare Other | Admitting: Adult Health

## 2012-09-14 ENCOUNTER — Encounter: Payer: Self-pay | Admitting: Adult Health

## 2012-09-14 VITALS — BP 138/86 | HR 95 | Temp 98.2°F | Ht 64.0 in | Wt 167.0 lb

## 2012-09-14 DIAGNOSIS — R0609 Other forms of dyspnea: Secondary | ICD-10-CM | POA: Diagnosis not present

## 2012-09-14 MED ORDER — LEVOFLOXACIN 500 MG PO TABS: 500.0000 mg | ORAL_TABLET | Freq: Every day | ORAL | Status: DC

## 2012-09-14 NOTE — Assessment & Plan Note (Signed)
Her shortness of breath has improved since she started Levaquin. No areas of consolidation heard on auscultation. Suspect this is a combination of her underlying pulmonary lupus, pulmonary HTN and URI. Continue antibiotic tx. Provided with additional 7 days of Levaquin in the event she still does not feel free of symptoms. RTC for f/u as needed if symptoms do not resolve or if symptoms worsen.

## 2012-09-14 NOTE — Progress Notes (Signed)
Subjective:    Patient ID: Diana [name], female    DOB: Jul 26, 1935, 77 y.o.   MRN: [id number]  HPI  Patient is a very pleasant woman with hx of lupus and pulm HTN. Last week she began having trouble breathing.  She explains that some of her medications for lupus and pulmonary HTN were changed approximately 2 weeks ago. She has not felt as good since these changes. Increased weakness, difficulty getting to the mailbox without having to stop to rest. Pt reports that her symptoms were very similar to when she had pneumonia in January 2013. During her last visit, Dr. Darrick Huntsman provided patient with a prescription for Levaquin with instructions to take if she began to have increasing symptoms. Patient began the Levaquin on Sunday. She reports feeling much better.  Current Outpatient Prescriptions on File Prior to Visit  Medication Sig Dispense Refill  . aspirin 81 MG tablet Take 81 mg by mouth daily.        . cyanocobalamin (,VITAMIN B-12,) 1000 MCG/ML injection Inject 1 mL (1,000 mcg total) into the muscle every 30 (thirty) days.  10 mL  1  . estrogen, conjugated,-medroxyprogesterone (PREMPRO) 0.45-1.5 MG per tablet Take 1 tablet by mouth daily.        . hydroxychloroquine (PLAQUENIL) 200 MG tablet TAKE 1 TABLET EVERY DAY  30 tablet  8  . levothyroxine (SYNTHROID, LEVOTHROID) 150 MCG tablet Take 1 tablet (150 mcg total) by mouth daily.  30 tablet  5  . loratadine (CLARITIN) 10 MG tablet Take 10 mg by mouth as needed.      Marland Kitchen omeprazole (PRILOSEC) 20 MG capsule Take 40 mg by mouth daily.       Marland Kitchen PREDNISONE PO Take 7 mg by mouth daily.       . ranitidine (ZANTAC) 150 MG tablet Take 1 tablet (150 mg total) by mouth at bedtime.  30 tablet  5  . Tadalafil, PAH, (ADCIRCA) 20 MG TABS Take by mouth 2 (two) times daily.      . vitamin D, CHOLECALCIFEROL, 400 UNITS tablet Take 400 Units by mouth daily.        Marland Kitchen bosentan (TRACLEER) 125 MG tablet Take 125 mg by mouth 2 (two) times daily.        . metoprolol  succinate (TOPROL-XL) 25 MG 24 hr tablet Take 1 tablet (25 mg total) by mouth daily.  30 tablet  11  . mycophenolate (CELLCEPT) 250 MG capsule Take 250 mg by mouth. Dose changed. See new prescription         Review of Systems  Constitutional: Positive for chills and fatigue. Negative for fever and appetite change.  HENT: Positive for rhinorrhea and postnasal drip. Negative for sore throat and sinus pressure.   Eyes: Negative.   Respiratory: Positive for chest tightness, shortness of breath and wheezing. Negative for cough.   Gastrointestinal: Positive for nausea.  Genitourinary: Negative.   Skin: Negative for rash.  Neurological: Positive for weakness and light-headedness. Negative for dizziness and headaches.  Psychiatric/Behavioral: Negative.     BP 138/86  Pulse 95  Temp 98.2 F (36.8 C) (Oral)  Ht 5\' 4"  (1.626 m)  Wt 167 lb (75.751 kg)  BMI 28.67 kg/m2  SpO2 97%     Objective:   Physical Exam  Constitutional: She is oriented to person, place, and time. She appears well-developed and well-nourished. No distress.  HENT:  Head: Normocephalic.  Mouth/Throat: Oropharynx is clear and moist. No oropharyngeal exudate.  Neck: Normal range of motion.  Cardiovascular: Normal rate, regular rhythm and normal heart sounds.  Exam reveals no gallop.   No murmur heard. Pulmonary/Chest: Effort normal. No respiratory distress. She has no wheezes.       Diminished air movement RLL.  Abdominal: Soft. Bowel sounds are normal. There is no tenderness.  Musculoskeletal: Normal range of motion. She exhibits no edema.  Lymphadenopathy:    She has no cervical adenopathy.  Neurological: She is alert and oriented to person, place, and time.  Skin: Skin is warm and dry. No rash noted.  Psychiatric: She has a normal mood and affect. Her behavior is normal. Judgment and thought content normal.           Assessment & Plan:

## 2012-09-14 NOTE — Patient Instructions (Addendum)
  Continue your antibiotics.  Start the second course of Levaquin if your symptoms are not fully resolved.  Stay hydrated and take frequent rest periods.  Call if you are not feeling any better by Monday.

## 2012-09-20 ENCOUNTER — Telehealth: Payer: Self-pay | Admitting: Internal Medicine

## 2012-09-20 NOTE — Telephone Encounter (Signed)
Patient left voicemail on 09/18/12 stating she felt better however would like tussinex called in for cough that she now has.

## 2012-09-24 MED ORDER — HYDROCOD POLST-CHLORPHEN POLST 10-8 MG/5ML PO LQCR: 10.0000 mL | Freq: Every evening | ORAL | Status: DC | PRN

## 2012-09-24 NOTE — Telephone Encounter (Signed)
Ok to call in tussionex,  For patient ..rx printed

## 2012-09-24 NOTE — Telephone Encounter (Signed)
Please advise if ok ?

## 2012-09-24 NOTE — Telephone Encounter (Signed)
Phoned in.

## 2012-10-23 DIAGNOSIS — M1710 Unilateral primary osteoarthritis, unspecified knee: Secondary | ICD-10-CM | POA: Diagnosis not present

## 2012-10-23 DIAGNOSIS — M76899 Other specified enthesopathies of unspecified lower limb, excluding foot: Secondary | ICD-10-CM | POA: Diagnosis not present

## 2012-10-31 ENCOUNTER — Encounter: Payer: Self-pay | Admitting: Internal Medicine

## 2012-10-31 ENCOUNTER — Inpatient Hospital Stay: Payer: Self-pay | Admitting: Internal Medicine

## 2012-10-31 DIAGNOSIS — R51 Headache: Secondary | ICD-10-CM | POA: Diagnosis not present

## 2012-10-31 DIAGNOSIS — N058 Unspecified nephritic syndrome with other morphologic changes: Secondary | ICD-10-CM | POA: Diagnosis present

## 2012-10-31 DIAGNOSIS — I270 Primary pulmonary hypertension: Secondary | ICD-10-CM | POA: Diagnosis not present

## 2012-10-31 DIAGNOSIS — IMO0002 Reserved for concepts with insufficient information to code with codable children: Secondary | ICD-10-CM | POA: Diagnosis not present

## 2012-10-31 DIAGNOSIS — N182 Chronic kidney disease, stage 2 (mild): Secondary | ICD-10-CM | POA: Diagnosis not present

## 2012-10-31 DIAGNOSIS — B028 Zoster with other complications: Secondary | ICD-10-CM | POA: Diagnosis not present

## 2012-10-31 DIAGNOSIS — R21 Rash and other nonspecific skin eruption: Secondary | ICD-10-CM | POA: Diagnosis not present

## 2012-10-31 DIAGNOSIS — M329 Systemic lupus erythematosus, unspecified: Secondary | ICD-10-CM | POA: Diagnosis not present

## 2012-10-31 DIAGNOSIS — Z7982 Long term (current) use of aspirin: Secondary | ICD-10-CM | POA: Diagnosis not present

## 2012-10-31 DIAGNOSIS — E039 Hypothyroidism, unspecified: Secondary | ICD-10-CM | POA: Diagnosis present

## 2012-10-31 DIAGNOSIS — B029 Zoster without complications: Secondary | ICD-10-CM | POA: Diagnosis not present

## 2012-10-31 DIAGNOSIS — K449 Diaphragmatic hernia without obstruction or gangrene: Secondary | ICD-10-CM | POA: Diagnosis not present

## 2012-10-31 DIAGNOSIS — R1110 Vomiting, unspecified: Secondary | ICD-10-CM | POA: Diagnosis present

## 2012-10-31 DIAGNOSIS — Z87891 Personal history of nicotine dependence: Secondary | ICD-10-CM | POA: Diagnosis not present

## 2012-10-31 DIAGNOSIS — B027 Disseminated zoster: Secondary | ICD-10-CM | POA: Insufficient documentation

## 2012-10-31 DIAGNOSIS — K5900 Constipation, unspecified: Secondary | ICD-10-CM | POA: Diagnosis present

## 2012-10-31 DIAGNOSIS — E860 Dehydration: Secondary | ICD-10-CM | POA: Diagnosis present

## 2012-10-31 DIAGNOSIS — I2789 Other specified pulmonary heart diseases: Secondary | ICD-10-CM | POA: Diagnosis present

## 2012-10-31 DIAGNOSIS — Z452 Encounter for adjustment and management of vascular access device: Secondary | ICD-10-CM | POA: Diagnosis not present

## 2012-10-31 DIAGNOSIS — R0602 Shortness of breath: Secondary | ICD-10-CM | POA: Diagnosis not present

## 2012-10-31 DIAGNOSIS — R339 Retention of urine, unspecified: Secondary | ICD-10-CM | POA: Diagnosis not present

## 2012-10-31 DIAGNOSIS — R3129 Other microscopic hematuria: Secondary | ICD-10-CM | POA: Diagnosis not present

## 2012-10-31 LAB — COMPREHENSIVE METABOLIC PANEL
Albumin: 3.8 g/dL (ref 3.4–5.0)
Anion Gap: 6 — ABNORMAL LOW (ref 7–16)
BUN: 22 mg/dL — ABNORMAL HIGH (ref 7–18)
Bilirubin,Total: 0.3 mg/dL (ref 0.2–1.0)
Calcium, Total: 8.7 mg/dL (ref 8.5–10.1)
Chloride: 107 mmol/L (ref 98–107)
Co2: 26 mmol/L (ref 21–32)
Creatinine: 1.14 mg/dL (ref 0.60–1.30)
Osmolality: 281 (ref 275–301)
SGPT (ALT): 32 U/L (ref 12–78)

## 2012-10-31 LAB — URINALYSIS, COMPLETE
Leukocyte Esterase: NEGATIVE
Protein: NEGATIVE
RBC,UR: 7 /HPF (ref 0–5)
Specific Gravity: 1.014 (ref 1.003–1.030)
Squamous Epithelial: <1
WBC UR: 2 /HPF (ref 0–5)

## 2012-10-31 LAB — CBC WITH DIFFERENTIAL/PLATELET
Basophil #: 0.1 10*3/uL (ref 0.0–0.1)
Basophil %: 1.3 %
Eosinophil #: 0.1 10*3/uL (ref 0.0–0.7)
Eosinophil %: 1.1 %
Lymphocyte %: 14.1 %
MCH: 27.9 pg (ref 26.0–34.0)
MCHC: 31.8 g/dL — ABNORMAL LOW (ref 32.0–36.0)
Monocyte %: 10.4 %
Neutrophil #: 3.4 10*3/uL (ref 1.4–6.5)
Neutrophil %: 73.1 %
Platelet: 189 10*3/uL (ref 150–440)

## 2012-11-01 LAB — CBC WITH DIFFERENTIAL/PLATELET
Basophil #: 0 10*3/uL (ref 0.0–0.1)
Eosinophil #: 0.1 10*3/uL (ref 0.0–0.7)
Eosinophil %: 3.3 %
HCT: 36 % (ref 35.0–47.0)
HGB: 11.7 g/dL — ABNORMAL LOW (ref 12.0–16.0)
Lymphocyte %: 18.3 %
MCHC: 32.4 g/dL (ref 32.0–36.0)
MCV: 88 fL (ref 80–100)
Monocyte #: 0.4 x10 3/mm (ref 0.2–0.9)
Monocyte %: 13 %
Neutrophil #: 1.9 10*3/uL (ref 1.4–6.5)
Neutrophil %: 64.1 %
RBC: 4.11 10*6/uL (ref 3.80–5.20)
WBC: 3 10*3/uL — ABNORMAL LOW (ref 3.6–11.0)

## 2012-11-01 LAB — BASIC METABOLIC PANEL
Anion Gap: 9 (ref 7–16)
Calcium, Total: 8.6 mg/dL (ref 8.5–10.1)
Co2: 23 mmol/L (ref 21–32)
EGFR (African American): >60
EGFR (Non-African Amer.): >60
Osmolality: 284 (ref 275–301)
Potassium: 4.1 mmol/L (ref 3.5–5.1)
Sodium: 142 mmol/L (ref 136–145)

## 2012-11-01 LAB — MAGNESIUM: Magnesium: 1.8 mg/dL

## 2012-11-02 LAB — BASIC METABOLIC PANEL
Anion Gap: 8 (ref 7–16)
BUN: 10 mg/dL (ref 7–18)
Chloride: 111 mmol/L — ABNORMAL HIGH (ref 98–107)
Creatinine: 0.99 mg/dL (ref 0.60–1.30)
EGFR (African American): >60
EGFR (Non-African Amer.): 56 — ABNORMAL LOW
Potassium: 3.8 mmol/L (ref 3.5–5.1)

## 2012-11-03 LAB — BASIC METABOLIC PANEL
Anion Gap: 7 (ref 7–16)
BUN: 9 mg/dL (ref 7–18)
Calcium, Total: 8.4 mg/dL — ABNORMAL LOW (ref 8.5–10.1)
Chloride: 112 mmol/L — ABNORMAL HIGH (ref 98–107)
Co2: 24 mmol/L (ref 21–32)
Creatinine: 1 mg/dL (ref 0.60–1.30)
Osmolality: 284 (ref 275–301)
Sodium: 143 mmol/L (ref 136–145)

## 2012-11-03 LAB — URINE CULTURE

## 2012-11-04 ENCOUNTER — Ambulatory Visit: Payer: Self-pay | Admitting: Urology

## 2012-11-05 LAB — PLATELET COUNT: Platelet: 219 10*3/uL (ref 150–440)

## 2012-11-05 LAB — BASIC METABOLIC PANEL
BUN: 11 mg/dL (ref 7–18)
Calcium, Total: 8.7 mg/dL (ref 8.5–10.1)
Chloride: 104 mmol/L (ref 98–107)
Co2: 27 mmol/L (ref 21–32)
Creatinine: 0.9 mg/dL (ref 0.60–1.30)
Potassium: 3.8 mmol/L (ref 3.5–5.1)
Sodium: 138 mmol/L (ref 136–145)

## 2012-11-08 DIAGNOSIS — I2789 Other specified pulmonary heart diseases: Secondary | ICD-10-CM | POA: Diagnosis not present

## 2012-11-08 DIAGNOSIS — M329 Systemic lupus erythematosus, unspecified: Secondary | ICD-10-CM | POA: Diagnosis not present

## 2012-11-08 DIAGNOSIS — Z79899 Other long term (current) drug therapy: Secondary | ICD-10-CM | POA: Diagnosis not present

## 2012-11-09 ENCOUNTER — Telehealth: Payer: Self-pay | Admitting: Internal Medicine

## 2012-11-09 MED ORDER — LACTULOSE 20 GM/30ML PO SOLN: 30.0000 mL | Freq: Four times a day (QID) | ORAL | Status: DC | PRN

## 2012-11-09 NOTE — Telephone Encounter (Signed)
rx for lactulose sent

## 2012-11-09 NOTE — Telephone Encounter (Signed)
Pt.notified

## 2012-11-09 NOTE — Telephone Encounter (Signed)
Hospital follow up scheduled on 3.4.14. @ 11.  Patient is needing something to help her have a bowel movement . She is wanting it called into the drug store [address].

## 2012-11-09 NOTE — Telephone Encounter (Signed)
Pt called back states she has shingles and that is why she was hospitalized. Has not been able to have a bowel movement in 3 days.

## 2012-11-13 ENCOUNTER — Telehealth: Payer: Self-pay | Admitting: Internal Medicine

## 2012-11-13 DIAGNOSIS — M76899 Other specified enthesopathies of unspecified lower limb, excluding foot: Secondary | ICD-10-CM | POA: Diagnosis not present

## 2012-11-13 MED ORDER — LEVOTHYROXINE SODIUM 150 MCG PO TABS: 150.0000 ug | ORAL_TABLET | Freq: Every day | ORAL | Status: DC

## 2012-11-13 NOTE — Telephone Encounter (Signed)
Pharmacy calling, state they have been trying to get refill on Synthroid for pt since 10/28/2012.  Pt had been prescribed Synthriod 150 mg #30 with 5 refills previously.  Pharmacy is asking to do 90 day supply with 1 refill if possible.

## 2012-11-13 NOTE — Telephone Encounter (Signed)
Med filled.  

## 2012-11-14 DIAGNOSIS — R338 Other retention of urine: Secondary | ICD-10-CM | POA: Diagnosis not present

## 2012-11-15 ENCOUNTER — Ambulatory Visit: Payer: Self-pay | Admitting: Specialist

## 2012-11-15 DIAGNOSIS — IMO0002 Reserved for concepts with insufficient information to code with codable children: Secondary | ICD-10-CM | POA: Diagnosis not present

## 2012-11-20 ENCOUNTER — Encounter: Payer: Self-pay | Admitting: Internal Medicine

## 2012-11-20 ENCOUNTER — Ambulatory Visit (INDEPENDENT_AMBULATORY_CARE_PROVIDER_SITE_OTHER): Payer: Medicare Other | Admitting: Internal Medicine

## 2012-11-20 VITALS — BP 126/80 | HR 105 | Temp 97.6°F | Resp 16 | Wt 161.5 lb

## 2012-11-20 DIAGNOSIS — M5430 Sciatica, unspecified side: Secondary | ICD-10-CM | POA: Insufficient documentation

## 2012-11-20 DIAGNOSIS — B0229 Other postherpetic nervous system involvement: Secondary | ICD-10-CM | POA: Insufficient documentation

## 2012-11-20 DIAGNOSIS — S31000A Unspecified open wound of lower back and pelvis without penetration into retroperitoneum, initial encounter: Secondary | ICD-10-CM | POA: Insufficient documentation

## 2012-11-20 MED ORDER — OXYCODONE-ACETAMINOPHEN 5-325 MG PO TABS: 1.0000 | ORAL_TABLET | ORAL | Status: DC | PRN

## 2012-11-20 MED ORDER — VALACYCLOVIR HCL 1 G PO TABS: 1000.0000 mg | ORAL_TABLET | Freq: Three times a day (TID) | ORAL | Status: DC

## 2012-11-20 NOTE — Assessment & Plan Note (Addendum)
Now resolved.  Epidose occurred on Feb 12 leading to admission to [facility] for IV acyclovir x 1 week. Patient was discharged home on  Valtrex which she finished. She continues to have neuropathic pain not well controlled with frequent Vicodin and gabapentin use. Her mobility is limited due to her severe burning in her feet. She needs wound care for a large eschar covered wound on her sacral area. I am referring her for home health nursing for wound care and assistance with ADLs. Also sent a prescription for Valtrex to her pharmacy so that she may start it next time she has an episode of pain which may herald another episode of shingles

## 2012-11-20 NOTE — Progress Notes (Signed)
Patient ID: Diana [name], female   DOB: [birth year], 78 y.o.   MRN: [id number]   Patient Active Problem List  Diagnosis  . Unspecified Hypothyroidism  . ANEMIA, B12 DEFICIENCY  . NEUROPATHY-PERIPHERAL  . GERD  . LUPUS ERYTHEMATOSUS  . Systemic Lupus Erythematosus  . GLUCOSE INTOLERANCE, MINIMAL, HX OF  . SHINGLES, HX OF  . TOBACCO ABUSE, HX OF  . Insomnia  . Disseminated zoster  . Sciatica of right side  . Wound of sacral region    Subjective:  CC:   Chief Complaint  Patient presents with  . Follow-up    [facility] Discharge    HPI:   Diana [name] a 78 y.o. female who presents For hospital follow up.  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 Terie Purser, rheumatology at [facility] with CellCept, AdCirca and daily prednisone, pulmonary hypertension with nocturnal oxygen use who is admitted to [facility] on February 12 with disseminated zoster. Patient states that her symptoms started with a lesion on her lower back which occurred about 3-4 days after a steroid injection in the left hip by Dr. Deeann Saint. The lesion on her lower back was painful and within 24 hours she had developed diffuse lesions and was seen by Dr. Adolphus Birchwood her dermatologist who recommended admission to [facility] for disseminated zoster. She was admitted and given IV acyclovir and evaluated by Dr. Leavy Cella of infectious disease. She was subsequently switched to oral Valtrex with lesion start to scab over. During the admission she was also evaluated by urology for urinary retention requiring temporary placement of Foley catheter. Patient had to have a central venous catheter put in for IV access. There were no complications from central line placement and multiple chest x-rays during admission were negative for infiltrate and pneumothorax.  IV site is healing well.   She is miserable due to persistent pain. She continues to have severe diffuse neuropathic pain from her zoster  episode.  Her pain is so severe that she has been taking up to 6 Vicodin daily. She has bilateral foot pain with burning sensation which is limiting her ability to ambulate. She is having trouble breathing activities of daily living at this time. Her constipation and urinary incontinence which were problematic during hospitalization have improved. She is using lactulose on a daily basis to move her bowels. But still has saddle  paresthesias and foot numbness along with burning pain in her feet. She continues to have low back pain which radiates to her right leg and had an MRI the lumbar spine recently with the results pending. This was ordered by her orthopedist Dr. Deeann Saint. Her appetite has been poor and she has lost 10 pounds since November.  aTaking neurontin 300 mg tid for shingles pain and hydrocodone 4 or 5 times daily    she was told by one of the nurses who is treating her in the hospital that she had a healing skin lesion on her lower back from the zoster blister but this has not been evaluated since she left the hospital on the 19th. She cannot see this area herself because of its location. It is painful to palpation. Because of her pain and paresthesias she is any difficulty walking and performing her ADLs at home  and is reliant on others to bring her to appointments. She is wearing bedroom slippers today  .     Past Medical History  Diagnosis Date  .  Lupus (systemic lupus erythematosus)   . Pulmonary hypertension   . Pneumonia   . Unspecified menopausal and postmenopausal disorder   . Primary pulmonary hypertension   . Acute glomerulonephritis with other specified pathological lesion in kidney in disease classified elsewhere   . Unspecified hypothyroidism     History reviewed. No pertinent past surgical history.     The following portions of the patient's history were reviewed and updated as appropriate: Allergies, current medications, and problem list.    Review of  Systems:   Patient denies headache, fevers,  skin rash, eye pain, sinus congestion and sinus pain, sore throat, dysphagia,  hemoptysis , cough, dyspnea, wheezing, chest pain, palpitations, orthopnea, edema, abdominal pain, nausea, melena, diarrhea, constipation, flank pain, dysuria, hematuria, urinary  Frequency, nocturia,  tingling, seizures,  Focal weakness, Loss of consciousness,  Tremor, insomnia, depression, and suicidal ideation.     History   Social History  . Marital Status: Single    Spouse Name: N/A    Number of Children: N/A  . Years of Education: N/A   Occupational History  . 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 Smoking status: Former Smoker    Quit date: 05/25/1991  . Smokeless tobacco: Never Used  . Alcohol Use: Yes     Comment: rare  . Drug Use: No  . Sexually Active: Not on file   Other Topics Concern  . Not on file   Social History Narrative  . No narrative on file    Objective:  BP 126/80  Pulse 105  Temp(Src) 97.6 F (36.4 C) (Oral)  Resp 16  Wt 161 lb 8 oz (73.256 kg)  BMI 27.71 kg/m2  SpO2 97%  General appearance: alert, cooperative and appears stated age Ears: normal TM's and external ear canals both ears Throat: lips, mucosa, and tongue normal; teeth and gums normal Neck: no adenopathy, no carotid bruit, supple, symmetrical, trachea midline and thyroid not enlarged, symmetric, no tenderness/mass/nodules Back: symmetric, no curvature. ROM normal. No CVA tenderness. Lungs: clear to auscultation bilaterally Heart: regular rate and rhythm, S1, S2 normal, no murmur, click, rub or gallop Abdomen: soft, non-tender; bowel sounds normal; no masses,  no organomegaly Pulses: 2+ and symmetric Skin: 4 cm escar covered sacral lesion with good granulation tissue at one edge.  2nd herpetiform escar covered lesions medial aspect of right ankle  Lymph nodes: Cervical, supraclavicular, and axillary nodes  normal.  Assessment and Plan:  Disseminated zoster Now resolved.  Epidose occurred on Feb 12 leading to admission to [facility] for IV acyclovir x 1 week. Patient was discharged home on  Valtrex which she finished. She continues to have neuropathic pain not well controlled with frequent Vicodin and gabapentin use. Her mobility is limited due to her severe burning in her feet. She needs wound care for a large eschar covered wound on her sacral area. I am referring her for home health nursing for wound care and assistance with ADLs. Also sent a prescription for Valtrex to her pharmacy so that she may start it next time she has an episode of pain which may herald another episode of shingles  Sciatica of right side Her pain is not well controlled with 6 Vicodin daily. I have reviewed her prescription today for oxycodone. She was instructed to take it up to 6 times daily on continue gabapentin. She follows up with Dr. Hyacinth Meeker next week. I have recommended referral to neurosurgeon if she is  considering surgery. Given her recent disseminated zoster outbreak following an intraarticular steroid inj in her right hip, ection she is not a good candidate for epidural steroid injections for back pain  Wound of sacral region She has a 4 cm eschar covered sacral wound which was noted during hospitalization by FLOOR NURSE.  She will need wound care for this and possible debridement if it fails to resolve with appropriate petroleum based dressings . I have made a referral to [facility] for home RN wound care. I suggested that she keep this covered with petroleum jelly and nonstick Telfa pad. If the wound has not improved in one week we will refer her to wound care.  Post herpetic neuralgia Her pain is not well controlled with 6 Vicodin daily. I have reviewed her prescription today for oxycodone. She was instructed to take it up to 6 times daily and continue gabapentin   Updated Medication List Outpatient Encounter  Prescriptions as of 11/20/2012  Medication Sig Dispense Refill  . aspirin 81 MG tablet Take 81 mg by mouth daily.        . cyanocobalamin (,VITAMIN B-12,) 1000 MCG/ML injection Inject 1 mL (1,000 mcg total) into the muscle every 30 (thirty) days.  10 mL  1  . estrogen, conjugated,-medroxyprogesterone (PREMPRO) 0.45-1.5 MG per tablet Take 1 tablet by mouth daily.        Marland Kitchen gabapentin (NEURONTIN) 300 MG capsule Take 1 capsule by mouth every 8 (eight) hours.      Marland Kitchen HYDROcodone-acetaminophen (NORCO/VICODIN) 5-325 MG per tablet Take 1 tablet by mouth every 4 (four) hours as needed.      . hydroxychloroquine (PLAQUENIL) 200 MG tablet TAKE 1 TABLET EVERY DAY  30 tablet  8  . Lactulose 20 GM/30ML SOLN Take 30 mLs (20 g total) by mouth every 6 (six) hours as needed (for relief of constipation).  120 mL  2  . levothyroxine (SYNTHROID, LEVOTHROID) 150 MCG tablet Take 1 tablet (150 mcg total) by mouth daily.  90 tablet  1  . loratadine (CLARITIN) 10 MG tablet Take 10 mg by mouth as needed.      . metoprolol succinate (TOPROL-XL) 25 MG 24 hr tablet Take 1 tablet (25 mg total) by mouth daily.  30 tablet  11  . mycophenolate (CELLCEPT) 500 MG tablet Take 500 mg by mouth 2 (two) times daily. Take two 500 mg tab in the morning and two  500 mg at bed time      . omeprazole (PRILOSEC) 20 MG capsule Take 40 mg by mouth daily.       . ondansetron (ZOFRAN) 4 MG tablet       . PREDNISONE PO Take 7 mg by mouth daily.       . ranitidine (ZANTAC) 150 MG tablet Take 1 tablet (150 mg total) by mouth at bedtime.  30 tablet  5  . Tadalafil, PAH, (ADCIRCA) 20 MG TABS Take by mouth 2 (two) times daily.      . valACYclovir (VALTREX) 1000 MG tablet       . vitamin D, CHOLECALCIFEROL, 400 UNITS tablet Take 400 Units by mouth daily.        . [DISCONTINUED] mycophenolate (CELLCEPT) 250 MG capsule Take 250 mg by mouth. Dose changed. See new prescription      . oxyCODONE-acetaminophen (ROXICET) 5-325 MG per tablet Take 1 tablet by mouth  every 4 (four) hours as needed for pain.  120 tablet  0  . valACYclovir (VALTREX) 1000 MG tablet Take  1 tablet (1,000 mg total) by mouth 3 (three) times daily.  28 tablet  0  . [DISCONTINUED] bosentan (TRACLEER) 125 MG tablet Take 125 mg by mouth 2 (two) times daily.        . [DISCONTINUED] chlorpheniramine-HYDROcodone (TUSSIONEX) 10-8 MG/5ML LQCR Take 10 mLs by mouth at bedtime as needed.  180 mL  0  . [DISCONTINUED] levofloxacin (LEVAQUIN) 500 MG tablet Take 1 tablet (500 mg total) by mouth daily.  7 tablet  0   No facility-administered encounter medications on file as of 11/20/2012.

## 2012-11-20 NOTE — Assessment & Plan Note (Signed)
Her pain is not well controlled with 6 Vicodin daily. I have reviewed her prescription today for oxycodone. She was instructed to take it up to 6 times daily on continue gabapentin. She follows up with Dr. Sabra Heck next week. I have recommended referral to neurosurgeon if she is considering surgery. Given her recent disseminated zoster outbreak following an intraarticular steroid inj in her right hip, ection she is not a good candidate for epidural steroid injections for back pain

## 2012-11-20 NOTE — Assessment & Plan Note (Signed)
Her pain is not well controlled with 6 Vicodin daily. I have reviewed her prescription today for oxycodone. She was instructed to take it up to 6 times daily and continue gabapentin

## 2012-11-20 NOTE — Patient Instructions (Addendum)
You may need to take dulcolax or sennakot 2 nightly to prevent constipation  from the oxycodone.  Home health RN to assist with wound care and hygiene   Start with one oxycodone,  You can take up to 6 daily   You can add a second neurontin  at bedtime to help you rest   Your MRI  showed L4-L5 nerve root issue on the right  And possibly  L2 on the left

## 2012-11-20 NOTE — Assessment & Plan Note (Signed)
She has a 4 cm eschar covered sacral wound which was noted during hospitalization by FLOOR NURSE.  She will need wound care for this and possible debridement if it fails to resolve with appropriate petroleum based dressings . I have made a referral to [facility] for home RN wound care. I suggested that she keep this covered with petroleum jelly and nonstick Telfa pad. If the wound has not improved in one week we will refer her to wound care.

## 2012-11-22 DIAGNOSIS — B0229 Other postherpetic nervous system involvement: Secondary | ICD-10-CM | POA: Diagnosis not present

## 2012-11-22 DIAGNOSIS — L98499 Non-pressure chronic ulcer of skin of other sites with unspecified severity: Secondary | ICD-10-CM | POA: Diagnosis not present

## 2012-11-22 DIAGNOSIS — M329 Systemic lupus erythematosus, unspecified: Secondary | ICD-10-CM | POA: Diagnosis not present

## 2012-11-22 DIAGNOSIS — B028 Zoster with other complications: Secondary | ICD-10-CM | POA: Diagnosis not present

## 2012-11-22 DIAGNOSIS — M5430 Sciatica, unspecified side: Secondary | ICD-10-CM | POA: Diagnosis not present

## 2012-11-22 DIAGNOSIS — IMO0002 Reserved for concepts with insufficient information to code with codable children: Secondary | ICD-10-CM | POA: Diagnosis not present

## 2012-11-27 ENCOUNTER — Telehealth: Payer: Self-pay | Admitting: Internal Medicine

## 2012-11-27 DIAGNOSIS — B0229 Other postherpetic nervous system involvement: Secondary | ICD-10-CM | POA: Diagnosis not present

## 2012-11-27 NOTE — Telephone Encounter (Signed)
Patient needing another prescription for her oxyCODONE-acetaminophen (ROXICET) 5-325 MG per tablet   She was with out power and she left her prescription at her house when the power came back on her sister cleaned the house and now she can't locate the prescription.   She is still in pain and wanting another prescription.

## 2012-11-28 NOTE — Telephone Encounter (Signed)
Noted  

## 2012-11-28 NOTE — Telephone Encounter (Signed)
Patient called this morning she found her prescription.

## 2012-12-05 DIAGNOSIS — M359 Systemic involvement of connective tissue, unspecified: Secondary | ICD-10-CM | POA: Diagnosis not present

## 2012-12-05 DIAGNOSIS — L98499 Non-pressure chronic ulcer of skin of other sites with unspecified severity: Secondary | ICD-10-CM | POA: Diagnosis not present

## 2012-12-05 DIAGNOSIS — M329 Systemic lupus erythematosus, unspecified: Secondary | ICD-10-CM | POA: Diagnosis not present

## 2012-12-05 DIAGNOSIS — B028 Zoster with other complications: Secondary | ICD-10-CM | POA: Diagnosis not present

## 2012-12-05 DIAGNOSIS — Z780 Asymptomatic menopausal state: Secondary | ICD-10-CM | POA: Diagnosis not present

## 2012-12-06 DIAGNOSIS — M329 Systemic lupus erythematosus, unspecified: Secondary | ICD-10-CM | POA: Diagnosis not present

## 2012-12-07 ENCOUNTER — Telehealth: Payer: Self-pay | Admitting: Internal Medicine

## 2012-12-07 DIAGNOSIS — L98499 Non-pressure chronic ulcer of skin of other sites with unspecified severity: Secondary | ICD-10-CM | POA: Diagnosis not present

## 2012-12-07 DIAGNOSIS — T07XXXA Unspecified multiple injuries, initial encounter: Secondary | ICD-10-CM

## 2012-12-07 DIAGNOSIS — M5430 Sciatica, unspecified side: Secondary | ICD-10-CM | POA: Diagnosis not present

## 2012-12-07 DIAGNOSIS — M359 Systemic involvement of connective tissue, unspecified: Secondary | ICD-10-CM | POA: Diagnosis not present

## 2012-12-07 DIAGNOSIS — M329 Systemic lupus erythematosus, unspecified: Secondary | ICD-10-CM | POA: Diagnosis not present

## 2012-12-07 DIAGNOSIS — B0229 Other postherpetic nervous system involvement: Secondary | ICD-10-CM

## 2012-12-07 DIAGNOSIS — B028 Zoster with other complications: Secondary | ICD-10-CM

## 2012-12-07 NOTE — Telephone Encounter (Signed)
Patient called.  She had bee taking acyclovir, and was told stop it.  She has zofran for nausea.

## 2012-12-07 NOTE — Telephone Encounter (Signed)
Pt scheduled appt 3/25 at 2:15.  States visible signs of shingles gone but still having a lot of nerve pain.  Pt has some questions about when she can go off of the meds and also thinks the meds may be making her nauseous.  Pt would like a call regarding this.

## 2012-12-10 DIAGNOSIS — L98499 Non-pressure chronic ulcer of skin of other sites with unspecified severity: Secondary | ICD-10-CM | POA: Diagnosis not present

## 2012-12-10 DIAGNOSIS — B028 Zoster with other complications: Secondary | ICD-10-CM | POA: Diagnosis not present

## 2012-12-10 DIAGNOSIS — M329 Systemic lupus erythematosus, unspecified: Secondary | ICD-10-CM | POA: Diagnosis not present

## 2012-12-10 DIAGNOSIS — M359 Systemic involvement of connective tissue, unspecified: Secondary | ICD-10-CM | POA: Diagnosis not present

## 2012-12-10 DIAGNOSIS — B0229 Other postherpetic nervous system involvement: Secondary | ICD-10-CM | POA: Diagnosis not present

## 2012-12-10 DIAGNOSIS — M5430 Sciatica, unspecified side: Secondary | ICD-10-CM | POA: Diagnosis not present

## 2012-12-11 ENCOUNTER — Ambulatory Visit: Payer: Medicare Other | Admitting: Internal Medicine

## 2012-12-11 ENCOUNTER — Telehealth: Payer: Self-pay | Admitting: Internal Medicine

## 2012-12-11 MED ORDER — LEVOFLOXACIN 500 MG PO TABS: 500.0000 mg | ORAL_TABLET | Freq: Every day | ORAL | Status: DC

## 2012-12-11 NOTE — Telephone Encounter (Signed)
Pt states she is not able to make it to her appt this afternoon, has a sinus infection, sore throat and her foot hurts.  Pt states she cannot drive and is unable to get here for the appt.  Pt asking if we can call her something in.  Pt states she spoke with Dr. Derrel Nip on Thursday or Friday.

## 2012-12-11 NOTE — Telephone Encounter (Signed)
Pt.notified

## 2012-12-11 NOTE — Telephone Encounter (Signed)
Yes, i will send rx for levaquin 500 mg daily for 7 days  .  I am prescribing an antibiotic (levaquin To manage the infection I also advise use of the following OTC meds to help with the other symptoms.   Take generic OTC benadryl 25 mg every 8 hours for the drainage,  Sudafed PE  10 to 30 mg every 8 hours for the congestion, you may substitute Afrin nasal spray for the nighttime dose of sudafed PE  If needed to prevent insomnia.  flushes your sinuses twice daily with Simply Saline (do over the sink because if you do it right you will spit out globs of mucus)  Use  OTC  Delsym   FOR THE COUGH.  Gargle with salt water as needed for sore throat.

## 2012-12-12 DIAGNOSIS — M329 Systemic lupus erythematosus, unspecified: Secondary | ICD-10-CM | POA: Diagnosis not present

## 2012-12-12 DIAGNOSIS — M359 Systemic involvement of connective tissue, unspecified: Secondary | ICD-10-CM | POA: Diagnosis not present

## 2012-12-12 DIAGNOSIS — B0229 Other postherpetic nervous system involvement: Secondary | ICD-10-CM | POA: Diagnosis not present

## 2012-12-12 DIAGNOSIS — B028 Zoster with other complications: Secondary | ICD-10-CM | POA: Diagnosis not present

## 2012-12-12 DIAGNOSIS — L98499 Non-pressure chronic ulcer of skin of other sites with unspecified severity: Secondary | ICD-10-CM | POA: Diagnosis not present

## 2012-12-12 DIAGNOSIS — M5430 Sciatica, unspecified side: Secondary | ICD-10-CM | POA: Diagnosis not present

## 2012-12-18 DIAGNOSIS — IMO0002 Reserved for concepts with insufficient information to code with codable children: Secondary | ICD-10-CM | POA: Diagnosis not present

## 2012-12-18 DIAGNOSIS — I82402 Acute embolism and thrombosis of unspecified deep veins of left lower extremity: Secondary | ICD-10-CM | POA: Insufficient documentation

## 2012-12-20 ENCOUNTER — Ambulatory Visit (INDEPENDENT_AMBULATORY_CARE_PROVIDER_SITE_OTHER): Payer: Medicare Other | Admitting: Adult Health

## 2012-12-20 ENCOUNTER — Encounter: Payer: Self-pay | Admitting: Adult Health

## 2012-12-20 VITALS — BP 125/83 | HR 87 | Temp 98.0°F | Resp 14 | Wt 162.0 lb

## 2012-12-20 DIAGNOSIS — R609 Edema, unspecified: Secondary | ICD-10-CM

## 2012-12-20 DIAGNOSIS — R600 Localized edema: Secondary | ICD-10-CM | POA: Insufficient documentation

## 2012-12-20 DIAGNOSIS — R6889 Other general symptoms and signs: Secondary | ICD-10-CM

## 2012-12-20 DIAGNOSIS — L0390 Cellulitis, unspecified: Secondary | ICD-10-CM

## 2012-12-20 DIAGNOSIS — R899 Unspecified abnormal finding in specimens from other organs, systems and tissues: Secondary | ICD-10-CM

## 2012-12-20 DIAGNOSIS — L0291 Cutaneous abscess, unspecified: Secondary | ICD-10-CM

## 2012-12-20 LAB — HEMOGLOBIN: Hemoglobin: 11.5 g/dL — ABNORMAL LOW (ref 12.0–15.0)

## 2012-12-20 LAB — BRAIN NATRIURETIC PEPTIDE: Pro B Natriuretic peptide (BNP): 51 pg/mL (ref 0.0–100.0)

## 2012-12-20 MED ORDER — DOXYCYCLINE HYCLATE 100 MG PO TABS: 100.0000 mg | ORAL_TABLET | Freq: Two times a day (BID) | ORAL | Status: DC

## 2012-12-20 NOTE — Progress Notes (Signed)
Subjective:    Patient ID: Diana [name], female    DOB: 1935-03-05, 78 y.o.   MRN: [id number]  HPI  Patient is a pleasant 78 y/o female who presents to clinic with LLE swelling and redness. She first noticed it on Saturday. She reports that she had gone out to dinner and when she returned that evening she noticed swelling in both legs. However, the right leg quickly improved. Patient has noticed slight improvement but incomplete resolution. Note, patient just completed a course of antibiotics yesterday for an upper respiratory infection. She also reports taking a dose of lasix with improvement noted. She denies shortness of breath, cp. Patient is also feeling slightly fatigued. She was recently hospitalized for 8 days 2/2 severe shingles. She reports that they told her that her hgb was low while in the hospital.   Current Outpatient Prescriptions on File Prior to Visit  Medication Sig Dispense Refill  . aspirin 81 MG tablet Take 81 mg by mouth daily.        . cyanocobalamin (,VITAMIN B-12,) 1000 MCG/ML injection Inject 1 mL (1,000 mcg total) into the muscle every 30 (thirty) days.  10 mL  1  . estrogen, conjugated,-medroxyprogesterone (PREMPRO) 0.45-1.5 MG per tablet Take 1 tablet by mouth daily.        Marland Kitchen HYDROcodone-acetaminophen (NORCO/VICODIN) 5-325 MG per tablet Take 1 tablet by mouth every 4 (four) hours as needed.      . hydroxychloroquine (PLAQUENIL) 200 MG tablet TAKE 1 TABLET EVERY DAY  30 tablet  8  . levothyroxine (SYNTHROID, LEVOTHROID) 150 MCG tablet Take 1 tablet (150 mcg total) by mouth daily.  90 tablet  1  . loratadine (CLARITIN) 10 MG tablet Take 10 mg by mouth as needed.      . mycophenolate (CELLCEPT) 500 MG tablet Take 500 mg by mouth 2 (two) times daily. Take two 500 mg tab in the morning and two  500 mg at bed time      . omeprazole (PRILOSEC) 20 MG capsule Take 40 mg by mouth daily.       . ondansetron (ZOFRAN) 4 MG tablet       . oxyCODONE-acetaminophen (ROXICET)  5-325 MG per tablet Take 1 tablet by mouth every 4 (four) hours as needed for pain.  120 tablet  0  . PREDNISONE PO Take 7 mg by mouth daily.       . ranitidine (ZANTAC) 150 MG tablet Take 1 tablet (150 mg total) by mouth at bedtime.  30 tablet  5  . Tadalafil, PAH, (ADCIRCA) 20 MG TABS Take by mouth 2 (two) times daily.      . vitamin D, CHOLECALCIFEROL, 400 UNITS tablet Take 400 Units by mouth daily.        Marland Kitchen gabapentin (NEURONTIN) 300 MG capsule Take 1 capsule by mouth every 8 (eight) hours.      . Lactulose 20 GM/30ML SOLN Take 30 mLs (20 g total) by mouth every 6 (six) hours as needed (for relief of constipation).  120 mL  2  . levofloxacin (LEVAQUIN) 500 MG tablet Take 1 tablet (500 mg total) by mouth daily.  7 tablet  0  . valACYclovir (VALTREX) 1000 MG tablet       . valACYclovir (VALTREX) 1000 MG tablet Take 1 tablet (1,000 mg total) by mouth 3 (three) times daily.  28 tablet  0   No current facility-administered medications on file prior to visit.     Review of Systems  Constitutional: Negative for  fever, chills and appetite change.  Respiratory: Negative for chest tightness, shortness of breath and wheezing.   Cardiovascular: Positive for leg swelling. Negative for chest pain.    BP 125/83  Pulse 87  Temp(Src) 98 F (36.7 C) (Oral)  Resp 14  Wt 162 lb (73.483 kg)  BMI 27.79 kg/m2  SpO2 97%     Objective:   Physical Exam  Constitutional: She is oriented to person, place, and time. She appears well-developed and well-nourished. No distress.  Cardiovascular: Normal rate, normal heart sounds and intact distal pulses.   Pulmonary/Chest: Effort normal and breath sounds normal. No respiratory distress. She has no wheezes. She has no rales.  Musculoskeletal: She exhibits edema.  RLE edema with erythema. Warm to touch  Neurological: She is alert and oriented to person, place, and time.  Skin: There is erythema.  Psychiatric: She has a normal mood and affect. Her behavior is  normal. Judgment and thought content normal.       Assessment & Plan:

## 2012-12-20 NOTE — Patient Instructions (Addendum)
  Have labs done prior to leaving the office.  I am sending in a prescription for doxycycline in the event the redness and swelling of the lower extremity worsens.

## 2012-12-20 NOTE — Assessment & Plan Note (Signed)
LLE with edema and erythema suspicious for cellulitis; however, patient just completed a course of antibiotics where she was being treated for upper respiratory infection yesterday. Her edema & redness has responded somewhat to Lasix. May also consider venous insufficiency as a differential diagnosis. I am checking a BNP and referring for her for venous Doppler. I have provided her with a prescription for antibiotic in the event the redness returns.

## 2012-12-21 ENCOUNTER — Other Ambulatory Visit: Payer: Self-pay | Admitting: Adult Health

## 2012-12-21 ENCOUNTER — Ambulatory Visit: Payer: Self-pay | Admitting: Internal Medicine

## 2012-12-21 DIAGNOSIS — I82409 Acute embolism and thrombosis of unspecified deep veins of unspecified lower extremity: Secondary | ICD-10-CM | POA: Diagnosis not present

## 2012-12-21 DIAGNOSIS — I824Y9 Acute embolism and thrombosis of unspecified deep veins of unspecified proximal lower extremity: Secondary | ICD-10-CM | POA: Diagnosis not present

## 2012-12-21 DIAGNOSIS — I82819 Embolism and thrombosis of superficial veins of unspecified lower extremities: Secondary | ICD-10-CM | POA: Diagnosis not present

## 2012-12-21 MED ORDER — WARFARIN SODIUM 5 MG PO TABS: ORAL_TABLET | ORAL | Status: DC

## 2012-12-21 MED ORDER — ENOXAPARIN SODIUM 80 MG/0.8ML ~~LOC~~ SOLN: 80.0000 mg | Freq: Two times a day (BID) | SUBCUTANEOUS | Status: DC

## 2012-12-24 ENCOUNTER — Telehealth: Payer: Self-pay | Admitting: *Deleted

## 2012-12-24 ENCOUNTER — Other Ambulatory Visit (INDEPENDENT_AMBULATORY_CARE_PROVIDER_SITE_OTHER): Payer: Medicare Other

## 2012-12-24 ENCOUNTER — Other Ambulatory Visit: Payer: Self-pay | Admitting: Adult Health

## 2012-12-24 DIAGNOSIS — Z7901 Long term (current) use of anticoagulants: Secondary | ICD-10-CM

## 2012-12-24 LAB — URINALYSIS, ROUTINE W REFLEX MICROSCOPIC
Specific Gravity, Urine: >=1.03 (ref 1.000–1.030)
Total Protein, Urine: NEGATIVE
Urine Glucose: NEGATIVE
pH: 5.5 (ref 5.0–8.0)

## 2012-12-24 LAB — PROTIME-INR: Prothrombin Time: 24.6 s — ABNORMAL HIGH (ref 10.2–12.4)

## 2012-12-24 NOTE — Telephone Encounter (Signed)
What labs and dx would you like for this pt?

## 2012-12-24 NOTE — Addendum Note (Signed)
Addended by: Karlene Einstein D on: 12/24/2012 02:54 PM   Modules accepted: Orders

## 2012-12-24 NOTE — Telephone Encounter (Signed)
I don't known.  Raquel saw the patient last week

## 2012-12-25 ENCOUNTER — Other Ambulatory Visit (INDEPENDENT_AMBULATORY_CARE_PROVIDER_SITE_OTHER): Payer: Medicare Other

## 2012-12-25 DIAGNOSIS — Z7901 Long term (current) use of anticoagulants: Secondary | ICD-10-CM | POA: Diagnosis not present

## 2012-12-25 LAB — PROTIME-INR
INR: 2.9 ratio — ABNORMAL HIGH (ref 0.8–1.0)
Prothrombin Time: 30.3 s — ABNORMAL HIGH (ref 10.2–12.4)

## 2012-12-26 ENCOUNTER — Other Ambulatory Visit: Payer: Self-pay | Admitting: Adult Health

## 2012-12-26 DIAGNOSIS — Z7901 Long term (current) use of anticoagulants: Secondary | ICD-10-CM

## 2012-12-26 NOTE — Progress Notes (Signed)
Pt.notified

## 2012-12-27 ENCOUNTER — Other Ambulatory Visit (INDEPENDENT_AMBULATORY_CARE_PROVIDER_SITE_OTHER): Payer: Medicare Other

## 2012-12-27 DIAGNOSIS — Z7901 Long term (current) use of anticoagulants: Secondary | ICD-10-CM

## 2012-12-31 ENCOUNTER — Other Ambulatory Visit (INDEPENDENT_AMBULATORY_CARE_PROVIDER_SITE_OTHER): Payer: Medicare Other

## 2012-12-31 DIAGNOSIS — Z7901 Long term (current) use of anticoagulants: Secondary | ICD-10-CM

## 2013-01-01 DIAGNOSIS — J9819 Other pulmonary collapse: Secondary | ICD-10-CM | POA: Diagnosis not present

## 2013-01-01 DIAGNOSIS — M329 Systemic lupus erythematosus, unspecified: Secondary | ICD-10-CM | POA: Diagnosis not present

## 2013-01-01 DIAGNOSIS — I82409 Acute embolism and thrombosis of unspecified deep veins of unspecified lower extremity: Secondary | ICD-10-CM | POA: Diagnosis not present

## 2013-01-01 DIAGNOSIS — M3500 Sicca syndrome, unspecified: Secondary | ICD-10-CM | POA: Diagnosis not present

## 2013-01-01 DIAGNOSIS — K449 Diaphragmatic hernia without obstruction or gangrene: Secondary | ICD-10-CM | POA: Diagnosis not present

## 2013-01-01 DIAGNOSIS — J90 Pleural effusion, not elsewhere classified: Secondary | ICD-10-CM | POA: Diagnosis not present

## 2013-01-01 DIAGNOSIS — R0602 Shortness of breath: Secondary | ICD-10-CM | POA: Diagnosis not present

## 2013-01-01 DIAGNOSIS — R918 Other nonspecific abnormal finding of lung field: Secondary | ICD-10-CM | POA: Diagnosis not present

## 2013-01-01 DIAGNOSIS — I700 Atherosclerosis of aorta: Secondary | ICD-10-CM | POA: Diagnosis not present

## 2013-01-01 DIAGNOSIS — E0789 Other specified disorders of thyroid: Secondary | ICD-10-CM | POA: Diagnosis not present

## 2013-01-01 DIAGNOSIS — M479 Spondylosis, unspecified: Secondary | ICD-10-CM | POA: Diagnosis not present

## 2013-01-01 DIAGNOSIS — B029 Zoster without complications: Secondary | ICD-10-CM | POA: Diagnosis not present

## 2013-01-02 ENCOUNTER — Telehealth: Payer: Self-pay | Admitting: *Deleted

## 2013-01-02 NOTE — Telephone Encounter (Signed)
Patient needs to be seen.

## 2013-01-02 NOTE — Telephone Encounter (Signed)
Called pt with lab results. Pt stated right lower leg swelling, pain, slight redness, no warmth. Started yesterday. Please advise.

## 2013-01-03 ENCOUNTER — Ambulatory Visit (INDEPENDENT_AMBULATORY_CARE_PROVIDER_SITE_OTHER): Payer: Medicare Other | Admitting: Adult Health

## 2013-01-03 ENCOUNTER — Encounter: Payer: Self-pay | Admitting: Adult Health

## 2013-01-03 VITALS — BP 122/80 | HR 92 | Temp 98.3°F | Resp 16 | Wt 164.5 lb

## 2013-01-03 DIAGNOSIS — R600 Localized edema: Secondary | ICD-10-CM

## 2013-01-03 DIAGNOSIS — R069 Unspecified abnormalities of breathing: Secondary | ICD-10-CM

## 2013-01-03 DIAGNOSIS — Z7901 Long term (current) use of anticoagulants: Secondary | ICD-10-CM

## 2013-01-03 DIAGNOSIS — R0689 Other abnormalities of breathing: Secondary | ICD-10-CM

## 2013-01-03 DIAGNOSIS — R609 Edema, unspecified: Secondary | ICD-10-CM | POA: Diagnosis not present

## 2013-01-03 NOTE — Telephone Encounter (Signed)
Spoke with pt's son, appointment scheduled for 4:00pm today.

## 2013-01-04 ENCOUNTER — Encounter: Payer: Self-pay | Admitting: Adult Health

## 2013-01-04 DIAGNOSIS — R0689 Other abnormalities of breathing: Secondary | ICD-10-CM | POA: Insufficient documentation

## 2013-01-04 NOTE — Assessment & Plan Note (Addendum)
Fluid bilateral lower lobes posteriorly. Pt in NAD. Pt reports that she has lasix at home. Uncertain of dose. Instructed to take lasix daily for next few days. RTC for f/u on Monday. Pt will call first thing on Monday morning for appt.

## 2013-01-04 NOTE — Progress Notes (Signed)
Subjective:    Patient ID: Diana [name], female    DOB: 11-17-1934, 78 y.o.   MRN: [id number]  HPI  Patient presents to clinic with complaints of right leg swelling which started yesterday. She is currently on Coumadin 5 mg daily for left popliteal DVT. She reports that the right leg swelling has improved with elevation. Patient has also reported that she is having shortness of breath with exertion. None at rest. She was seen by her Lupus specialist yesterday and pt reports having a chest CT done which showed fluid in bilateral bases.    Current Outpatient Prescriptions on File Prior to Visit  Medication Sig Dispense Refill  . cyanocobalamin (,VITAMIN B-12,) 1000 MCG/ML injection Inject 1 mL (1,000 mcg total) into the muscle every 30 (thirty) days.  10 mL  1  . estrogen, conjugated,-medroxyprogesterone (PREMPRO) 0.45-1.5 MG per tablet Take 1 tablet by mouth daily.        Marland Kitchen gabapentin (NEURONTIN) 300 MG capsule Take 1 capsule by mouth every 8 (eight) hours.      Marland Kitchen HYDROcodone-acetaminophen (NORCO/VICODIN) 5-325 MG per tablet Take 1 tablet by mouth every 4 (four) hours as needed.      . hydroxychloroquine (PLAQUENIL) 200 MG tablet TAKE 1 TABLET EVERY DAY  30 tablet  8  . Lactulose 20 GM/30ML SOLN Take 30 mLs (20 g total) by mouth every 6 (six) hours as needed (for relief of constipation).  120 mL  2  . levothyroxine (SYNTHROID, LEVOTHROID) 150 MCG tablet Take 1 tablet (150 mcg total) by mouth daily.  90 tablet  1  . loratadine (CLARITIN) 10 MG tablet Take 10 mg by mouth as needed.      . mycophenolate (CELLCEPT) 500 MG tablet Take 500 mg by mouth 2 (two) times daily. Take two 500 mg tab in the morning and two  500 mg at bed time      . omeprazole (PRILOSEC) 20 MG capsule Take 40 mg by mouth daily.       . ondansetron (ZOFRAN) 4 MG tablet       . oxyCODONE-acetaminophen (ROXICET) 5-325 MG per tablet Take 1 tablet by mouth every 4 (four) hours as needed for pain.  120 tablet  0  . PREDNISONE PO  Take 7 mg by mouth daily.       . ranitidine (ZANTAC) 150 MG tablet Take 1 tablet (150 mg total) by mouth at bedtime.  30 tablet  5  . Tadalafil, PAH, (ADCIRCA) 20 MG TABS Take by mouth 2 (two) times daily.      . valACYclovir (VALTREX) 1000 MG tablet Take 1 tablet (1,000 mg total) by mouth 3 (three) times daily.  28 tablet  0  . vitamin D, CHOLECALCIFEROL, 400 UNITS tablet Take 400 Units by mouth daily.        Marland Kitchen warfarin (COUMADIN) 5 MG tablet Take 2 (10mg )  tablets tomorrow 12/22/12 at 5 pm followed by 1 tablet (5mg ) daily.  30 tablet  3   No current facility-administered medications on file prior to visit.      Review of Systems  Constitutional: Negative for fever and chills.  Respiratory: Positive for shortness of breath. Negative for cough and wheezing.        Shortness of breath with exertion  Cardiovascular: Positive for leg swelling. Negative for chest pain.       Objective:   Physical Exam  Constitutional: She is oriented to person, place, and time. She appears well-developed and well-nourished. No distress.  Cardiovascular: Normal rate, regular rhythm and normal heart sounds.   Excellent pedal pulse on right foot  Pulmonary/Chest: Effort normal. She has rales.  Bilateral bases posteriorly with rales  Musculoskeletal: She exhibits edema.  Right LE with slight edema.   Neurological: She is alert and oriented to person, place, and time.  Skin: Skin is warm and dry.  Psychiatric: She has a normal mood and affect. Her behavior is normal. Judgment and thought content normal.          Assessment & Plan:

## 2013-01-04 NOTE — Patient Instructions (Addendum)
  Take lasix - first dose when you get home.  Then take lasix daily for few days.  Make appt for f/u Monday

## 2013-01-04 NOTE — Assessment & Plan Note (Signed)
Pain improved since yesterday per patient report. May have been secondary to compensation in using RLE since developing left popliteal DVT.

## 2013-01-06 ENCOUNTER — Encounter: Payer: Self-pay | Admitting: Internal Medicine

## 2013-01-06 DIAGNOSIS — K449 Diaphragmatic hernia without obstruction or gangrene: Secondary | ICD-10-CM | POA: Insufficient documentation

## 2013-01-06 DIAGNOSIS — N289 Disorder of kidney and ureter, unspecified: Secondary | ICD-10-CM | POA: Insufficient documentation

## 2013-01-06 DIAGNOSIS — N2889 Other specified disorders of kidney and ureter: Secondary | ICD-10-CM | POA: Insufficient documentation

## 2013-01-07 ENCOUNTER — Encounter: Payer: Self-pay | Admitting: Internal Medicine

## 2013-01-07 ENCOUNTER — Other Ambulatory Visit: Payer: Medicare Other

## 2013-01-07 ENCOUNTER — Ambulatory Visit (INDEPENDENT_AMBULATORY_CARE_PROVIDER_SITE_OTHER): Payer: Medicare Other | Admitting: Internal Medicine

## 2013-01-07 VITALS — BP 130/85 | HR 96 | Temp 98.8°F | Resp 16 | Wt 164.0 lb

## 2013-01-07 DIAGNOSIS — B028 Zoster with other complications: Secondary | ICD-10-CM

## 2013-01-07 DIAGNOSIS — M79609 Pain in unspecified limb: Secondary | ICD-10-CM | POA: Diagnosis not present

## 2013-01-07 DIAGNOSIS — N289 Disorder of kidney and ureter, unspecified: Secondary | ICD-10-CM

## 2013-01-07 DIAGNOSIS — I82409 Acute embolism and thrombosis of unspecified deep veins of unspecified lower extremity: Secondary | ICD-10-CM | POA: Diagnosis not present

## 2013-01-07 DIAGNOSIS — I82509 Chronic embolism and thrombosis of unspecified deep veins of unspecified lower extremity: Secondary | ICD-10-CM | POA: Insufficient documentation

## 2013-01-07 DIAGNOSIS — Z7901 Long term (current) use of anticoagulants: Secondary | ICD-10-CM

## 2013-01-07 DIAGNOSIS — N2889 Other specified disorders of kidney and ureter: Secondary | ICD-10-CM

## 2013-01-07 DIAGNOSIS — Z79899 Other long term (current) drug therapy: Secondary | ICD-10-CM

## 2013-01-07 DIAGNOSIS — R609 Edema, unspecified: Secondary | ICD-10-CM

## 2013-01-07 DIAGNOSIS — I82402 Acute embolism and thrombosis of unspecified deep veins of left lower extremity: Secondary | ICD-10-CM

## 2013-01-07 DIAGNOSIS — K449 Diaphragmatic hernia without obstruction or gangrene: Secondary | ICD-10-CM

## 2013-01-07 DIAGNOSIS — R600 Localized edema: Secondary | ICD-10-CM

## 2013-01-07 DIAGNOSIS — B027 Disseminated zoster: Secondary | ICD-10-CM

## 2013-01-07 MED ORDER — OXYCODONE-ACETAMINOPHEN 5-325 MG PO TABS: 1.0000 | ORAL_TABLET | ORAL | Status: DC | PRN

## 2013-01-07 NOTE — Assessment & Plan Note (Signed)
Secondary to DVT and neurontin .  Will resume use of prn lasix,  Avoid compression stocking for another week.

## 2013-01-07 NOTE — Progress Notes (Signed)
Patient ID: Diana [name], female   DOB: [birth year], 78 y.o.   MRN: [id number]   Patient Active Problem List  Diagnosis  . Unspecified Hypothyroidism  . ANEMIA, B12 DEFICIENCY  . NEUROPATHY-PERIPHERAL  . GERD  . LUPUS ERYTHEMATOSUS  . Systemic Lupus Erythematosus  . GLUCOSE INTOLERANCE, MINIMAL, HX OF  . SHINGLES, HX OF  . TOBACCO ABUSE, HX OF  . Insomnia  . Disseminated zoster  . Sciatica of right side  . Wound of sacral region  . Post herpetic neuralgia  . Lower extremity edema  . Hiatal hernia  . Renal mass, left  . DVT, recurrent, lower extremity, acute    Subjective:  CC:   Chief Complaint  Patient presents with  . Follow-up    shingles , swelling in right leg and pain in the left    HPI:   Diana [name] a 78 y.o. female who presents 2 week follow up on shortness of breath  In setting of recently diagnosed LLE DVT.  Both legs were scanned initially.  The DVT occurred  in the setting of sedentary period  due to painful shingles episode that  attacked her left side from hip to plantar surface of foot.  The is is still painful and requiring prn narcotics.  She was treated with lovenox or 4 days until INr was therapeutic.   Was seen at [facility] for regular follow up on SLE and had reported increased dyspnea, so a CT chest was done at [facility] last week which did not show any certral PEs but  Too much artifact for smaller ves evaluation  INR was therapeutic however.    Today she is reporting  increased pain in  her right calf accompanied by edema in both legs    Past Medical History  Diagnosis Date  . Lupus (systemic lupus erythematosus)   . Pulmonary hypertension   . Pneumonia   . Unspecified menopausal and postmenopausal disorder   . Primary pulmonary hypertension   . Acute glomerulonephritis with other specified pathological lesion in kidney in disease classified elsewhere   . Unspecified hypothyroidism     No past surgical history on file.     The following  portions of the patient's history were reviewed and updated as appropriate: Allergies, current medications, and problem list.    Review of Systems:   Patient denies headache, fevers, malaise, unintentional weight loss, skin rash, eye pain, sinus congestion and sinus pain, sore throat, dysphagia,  hemoptysis , cough, dyspnea, wheezing, chest pain, palpitations, orthopnea, edema, abdominal pain, nausea, melena, diarrhea, constipation, flank pain, dysuria, hematuria, urinary  Frequency, nocturia, numbness, tingling, seizures,  Focal weakness, Loss of consciousness,  Tremor, insomnia, depression, anxiety, and suicidal ideation.     History   Social History  . Marital Status: Single    Spouse Name: N/A    Number of Children: N/A  . Years of Education: N/A   Occupational History  . 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 Smoking status: Former Smoker    Quit date: 05/25/1991  . Smokeless tobacco: Never Used  . Alcohol Use: Yes     Comment: rare  . Drug Use: No  . Sexually Active: Not on file   Other Topics Concern  . Not on file   Social History Narrative  . No narrative on file    Objective:  BP 130/85  Pulse 96  Temp(Src) 98.8 F (37.1 C) (Oral)  Resp  16  Wt 164 lb (74.39 kg)  BMI 28.14 kg/m2  SpO2 98%  General appearance: alert, cooperative and appears stated age Ears: normal TM's and external ear canals both ears Throat: lips, mucosa, and tongue normal; teeth and gums normal Neck: no adenopathy, no carotid bruit, supple, symmetrical, trachea midline and thyroid not enlarged, symmetric, no tenderness/mass/nodules Back: symmetric, no curvature. ROM normal. No CVA tenderness. Lungs: clear to auscultation bilaterally Heart: regular rate and rhythm, S1, S2 normal, no murmur, click, rub or gallop Abdomen: soft, non-tender; bowel sounds normal; no masses,  no organomegaly Pulses: 2+ and symmetric Skin: Skin color, texture, turgor  normal. No rashes or lesions Lymph nodes: Cervical, supraclavicular, and axillary nodes normal.  Assessment and Plan:  Disseminated zoster Recent episode involving right side leading to prolonged immobility and DVT  DVT, recurrent, lower extremity, acute checking cardiolipin ab,  AT Thrombin III and Factor V Leiden .  Repeat scan of RLE due to recurent pain   Lower extremity edema Secondary to DVT and neurontin .  Will resume use of prn lasix,  Avoid compression stocking for another week.   Hiatal hernia Large, with marked increase in size by repeat CT chest.  Advised her to  discuss with pulmonology next week regarding its contribution of restrictive airway disease as a component of her dyspnea.   Renal mass, left Unchanged from october 2013 per CT report"  , she follows up with nephrology next week   A total of 40 minutes was spent with patient more than half of which was spent in counseling, reviewing records from other prviders and coordination of care. Updated Medication List Outpatient Encounter Prescriptions as of 01/07/2013  Medication Sig Dispense Refill  . cyanocobalamin (,VITAMIN B-12,) 1000 MCG/ML injection Inject 1 mL (1,000 mcg total) into the muscle every 30 (thirty) days.  10 mL  1  . estrogen, conjugated,-medroxyprogesterone (PREMPRO) 0.45-1.5 MG per tablet Take 1 tablet by mouth daily.        Marland Kitchen gabapentin (NEURONTIN) 300 MG capsule Take 1 capsule by mouth every 8 (eight) hours.      . hydroxychloroquine (PLAQUENIL) 200 MG tablet TAKE 1 TABLET EVERY DAY  30 tablet  8  . levothyroxine (SYNTHROID, LEVOTHROID) 150 MCG tablet Take 1 tablet (150 mcg total) by mouth daily.  90 tablet  1  . loratadine (CLARITIN) 10 MG tablet Take 10 mg by mouth as needed.      . mycophenolate (CELLCEPT) 500 MG tablet Take 500 mg by mouth 2 (two) times daily. Take two 500 mg tab in the morning and two  500 mg at bed time      . omeprazole (PRILOSEC) 20 MG capsule Take 40 mg by mouth daily.        . ondansetron (ZOFRAN) 4 MG tablet       . oxyCODONE-acetaminophen (ROXICET) 5-325 MG per tablet Take 1 tablet by mouth every 4 (four) hours as needed for pain.  120 tablet  0  . PREDNISONE PO Take 7 mg by mouth daily.       . ranitidine (ZANTAC) 150 MG tablet Take 1 tablet (150 mg total) by mouth at bedtime.  30 tablet  5  . Tadalafil, PAH, (ADCIRCA) 20 MG TABS Take by mouth 2 (two) times daily.      . vitamin D, CHOLECALCIFEROL, 400 UNITS tablet Take 400 Units by mouth daily.        Marland Kitchen warfarin (COUMADIN) 5 MG tablet Take 2 (10mg )  tablets tomorrow 12/22/12 at  5 pm followed by 1 tablet (5mg ) daily.  30 tablet  3  . [DISCONTINUED] oxyCODONE-acetaminophen (ROXICET) 5-325 MG per tablet Take 1 tablet by mouth every 4 (four) hours as needed for pain.  120 tablet  0  . HYDROcodone-acetaminophen (NORCO/VICODIN) 5-325 MG per tablet Take 1 tablet by mouth every 4 (four) hours as needed.      . Lactulose 20 GM/30ML SOLN Take 30 mLs (20 g total) by mouth every 6 (six) hours as needed (for relief of constipation).  120 mL  2  . valACYclovir (VALTREX) 1000 MG tablet Take 1 tablet (1,000 mg total) by mouth 3 (three) times daily.  28 tablet  0   No facility-administered encounter medications on file as of 01/07/2013.

## 2013-01-07 NOTE — Assessment & Plan Note (Signed)
Large, with marked increase in size by repeat CT chest.  Advised her to  discuss with pulmonology next week regarding its contribution of restrictive airway disease as a component of her dyspnea.

## 2013-01-07 NOTE — Assessment & Plan Note (Signed)
Recent episode involving right side leading to prolonged immobility and DVT

## 2013-01-07 NOTE — Assessment & Plan Note (Signed)
checking cardiolipin ab,  AT Thrombin III and Factor V Leiden .  Repeat scan of RLE due to recurent pain

## 2013-01-07 NOTE — Assessment & Plan Note (Signed)
Unchanged from october 2013 per CT report"  , she follows up with nephrology next week

## 2013-01-07 NOTE — Patient Instructions (Addendum)
Use the furosemide as needed for wt gain accompanied by fluid retention   If your baseline potassium today is <3.6,  I will add prn KCL 20 meq to take for each  lasix dose     Repeating scan of right leg to rule out a clot  Talk to your pulmonologist about the hiatal hernia's effect on your breathing

## 2013-01-08 ENCOUNTER — Other Ambulatory Visit (INDEPENDENT_AMBULATORY_CARE_PROVIDER_SITE_OTHER): Payer: Medicare Other

## 2013-01-08 DIAGNOSIS — Z7901 Long term (current) use of anticoagulants: Secondary | ICD-10-CM | POA: Diagnosis not present

## 2013-01-08 DIAGNOSIS — I82409 Acute embolism and thrombosis of unspecified deep veins of unspecified lower extremity: Secondary | ICD-10-CM

## 2013-01-08 LAB — CBC WITH DIFFERENTIAL/PLATELET
Basophils Absolute: 0.1 10*3/uL (ref 0.0–0.1)
Basophils Relative: 1 % (ref 0–1)
Lymphocytes Relative: 28 % (ref 12–46)
MCHC: 32.3 g/dL (ref 30.0–36.0)
Neutro Abs: 3.2 10*3/uL (ref 1.7–7.7)
Neutrophils Relative %: 60 % (ref 43–77)
Platelets: 297 10*3/uL (ref 150–400)
RDW: 19 % — ABNORMAL HIGH (ref 11.5–15.5)
WBC: 5.4 10*3/uL (ref 4.0–10.5)

## 2013-01-08 NOTE — Addendum Note (Signed)
Addended by: Karlene Einstein D on: 01/08/2013 12:18 PM   Modules accepted: Orders

## 2013-01-09 LAB — FACTOR 5 LEIDEN

## 2013-01-09 LAB — COMPLETE METABOLIC PANEL WITH GFR
Albumin: 3.5 g/dL (ref 3.5–5.2)
Alkaline Phosphatase: 43 U/L (ref 39–117)
BUN: 17 mg/dL (ref 6–23)
CO2: 26 mEq/L (ref 19–32)
GFR, Est African American: 63 mL/min
GFR, Est Non African American: 55 mL/min — ABNORMAL LOW
Glucose, Bld: 128 mg/dL — ABNORMAL HIGH (ref 70–99)
Potassium: 4.4 mEq/L (ref 3.5–5.3)
Sodium: 141 mEq/L (ref 135–145)
Total Protein: 6.1 g/dL (ref 6.0–8.3)

## 2013-01-11 LAB — CARDIOLIPIN ANTIBODY: Phospholipids: 226 mg/dL (ref 151–264)

## 2013-01-16 ENCOUNTER — Encounter: Payer: Self-pay | Admitting: Internal Medicine

## 2013-01-23 ENCOUNTER — Telehealth: Payer: Self-pay | Admitting: *Deleted

## 2013-01-23 ENCOUNTER — Other Ambulatory Visit: Payer: Self-pay | Admitting: *Deleted

## 2013-01-23 DIAGNOSIS — Z7901 Long term (current) use of anticoagulants: Secondary | ICD-10-CM

## 2013-01-23 NOTE — Telephone Encounter (Signed)
Ok

## 2013-01-23 NOTE — Telephone Encounter (Signed)
Pt/inr.  Can you make it a repeating monthly? thanks

## 2013-01-23 NOTE — Telephone Encounter (Signed)
Pt is coming in for labs tomorrow 05.08.2014 what labs and dx would you like?  Thank you

## 2013-01-24 ENCOUNTER — Telehealth: Payer: Self-pay | Admitting: *Deleted

## 2013-01-24 ENCOUNTER — Other Ambulatory Visit (INDEPENDENT_AMBULATORY_CARE_PROVIDER_SITE_OTHER): Payer: Medicare Other

## 2013-01-24 DIAGNOSIS — Z7901 Long term (current) use of anticoagulants: Secondary | ICD-10-CM | POA: Diagnosis not present

## 2013-01-24 LAB — PROTIME-INR
INR: 3.9 ratio — ABNORMAL HIGH (ref 0.8–1.0)
Prothrombin Time: 39.7 s — ABNORMAL HIGH (ref 10.2–12.4)

## 2013-01-24 NOTE — Telephone Encounter (Signed)
Please advise when received and I will be glad to call patient.

## 2013-01-24 NOTE — Telephone Encounter (Signed)
Pt came in for labs today and asked if someone can please call her ASAP about her PT/INR that was draw today 05.08.2014, she said she is going to [facility] tomorrow and the doctors there would like to know the results

## 2013-01-25 DIAGNOSIS — I5030 Unspecified diastolic (congestive) heart failure: Secondary | ICD-10-CM | POA: Diagnosis not present

## 2013-01-25 DIAGNOSIS — I2789 Other specified pulmonary heart diseases: Secondary | ICD-10-CM | POA: Diagnosis not present

## 2013-01-25 DIAGNOSIS — I87009 Postthrombotic syndrome without complications of unspecified extremity: Secondary | ICD-10-CM | POA: Diagnosis not present

## 2013-01-25 DIAGNOSIS — Z7901 Long term (current) use of anticoagulants: Secondary | ICD-10-CM | POA: Diagnosis not present

## 2013-01-25 DIAGNOSIS — Z79899 Other long term (current) drug therapy: Secondary | ICD-10-CM | POA: Diagnosis not present

## 2013-01-25 DIAGNOSIS — M3500 Sicca syndrome, unspecified: Secondary | ICD-10-CM | POA: Diagnosis not present

## 2013-01-25 DIAGNOSIS — IMO0002 Reserved for concepts with insufficient information to code with codable children: Secondary | ICD-10-CM | POA: Diagnosis not present

## 2013-01-25 DIAGNOSIS — I10 Essential (primary) hypertension: Secondary | ICD-10-CM | POA: Diagnosis not present

## 2013-01-25 DIAGNOSIS — Z87891 Personal history of nicotine dependence: Secondary | ICD-10-CM | POA: Diagnosis not present

## 2013-01-25 DIAGNOSIS — K449 Diaphragmatic hernia without obstruction or gangrene: Secondary | ICD-10-CM | POA: Diagnosis not present

## 2013-01-25 DIAGNOSIS — M329 Systemic lupus erythematosus, unspecified: Secondary | ICD-10-CM | POA: Diagnosis not present

## 2013-01-25 DIAGNOSIS — R609 Edema, unspecified: Secondary | ICD-10-CM | POA: Diagnosis not present

## 2013-01-25 DIAGNOSIS — R0609 Other forms of dyspnea: Secondary | ICD-10-CM | POA: Diagnosis not present

## 2013-02-06 ENCOUNTER — Telehealth: Payer: Self-pay | Admitting: *Deleted

## 2013-02-06 DIAGNOSIS — Z7901 Long term (current) use of anticoagulants: Secondary | ICD-10-CM

## 2013-02-06 NOTE — Telephone Encounter (Signed)
Pt is coming in tomorrow 05.22.2014 for labs is it just for a pt/inr?

## 2013-02-06 NOTE — Telephone Encounter (Signed)
Yes, thanks for asking °

## 2013-02-07 ENCOUNTER — Other Ambulatory Visit (INDEPENDENT_AMBULATORY_CARE_PROVIDER_SITE_OTHER): Payer: Medicare Other

## 2013-02-07 ENCOUNTER — Other Ambulatory Visit: Payer: Self-pay | Admitting: *Deleted

## 2013-02-07 DIAGNOSIS — Z7901 Long term (current) use of anticoagulants: Secondary | ICD-10-CM

## 2013-02-07 LAB — PROTIME-INR
INR: 2.3 ratio — ABNORMAL HIGH (ref 0.8–1.0)
Prothrombin Time: 23.5 s — ABNORMAL HIGH (ref 10.2–12.4)

## 2013-02-18 LAB — HM DIABETES EYE EXAM: HM Diabetic Eye Exam: NORMAL

## 2013-02-20 DIAGNOSIS — IMO0002 Reserved for concepts with insufficient information to code with codable children: Secondary | ICD-10-CM | POA: Diagnosis not present

## 2013-02-23 ENCOUNTER — Emergency Department: Payer: Self-pay | Admitting: Emergency Medicine

## 2013-02-23 DIAGNOSIS — R110 Nausea: Secondary | ICD-10-CM | POA: Diagnosis not present

## 2013-02-23 DIAGNOSIS — Z7901 Long term (current) use of anticoagulants: Secondary | ICD-10-CM | POA: Diagnosis not present

## 2013-02-23 DIAGNOSIS — I10 Essential (primary) hypertension: Secondary | ICD-10-CM | POA: Diagnosis not present

## 2013-02-23 DIAGNOSIS — R791 Abnormal coagulation profile: Secondary | ICD-10-CM | POA: Diagnosis not present

## 2013-02-23 LAB — CBC
HCT: 36.6 % (ref 35.0–47.0)
MCH: 30.2 pg (ref 26.0–34.0)
MCHC: 32.8 g/dL (ref 32.0–36.0)
MCV: 92 fL (ref 80–100)
Platelet: 294 10*3/uL (ref 150–440)
RBC: 3.96 10*6/uL (ref 3.80–5.20)
WBC: 4.3 10*3/uL (ref 3.6–11.0)

## 2013-02-23 LAB — URINALYSIS, COMPLETE
Glucose,UR: NEGATIVE mg/dL (ref 0–75)
Hyaline Cast: 3
Leukocyte Esterase: NEGATIVE
Nitrite: NEGATIVE
Ph: 5 (ref 4.5–8.0)
RBC,UR: 9 /HPF (ref 0–5)
Specific Gravity: 1.019 (ref 1.003–1.030)
WBC UR: 3 /HPF (ref 0–5)

## 2013-02-23 LAB — COMPREHENSIVE METABOLIC PANEL
Albumin: 3.7 g/dL (ref 3.4–5.0)
Alkaline Phosphatase: 59 U/L (ref 50–136)
Anion Gap: 8 (ref 7–16)
BUN: 18 mg/dL (ref 7–18)
Bilirubin,Total: 0.3 mg/dL (ref 0.2–1.0)
Calcium, Total: 8.9 mg/dL (ref 8.5–10.1)
Chloride: 109 mmol/L — ABNORMAL HIGH (ref 98–107)
EGFR (African American): 50 — ABNORMAL LOW
EGFR (Non-African Amer.): 43 — ABNORMAL LOW
Glucose: 153 mg/dL — ABNORMAL HIGH (ref 65–99)
Osmolality: 288 (ref 275–301)
SGOT(AST): 28 U/L (ref 15–37)
SGPT (ALT): 21 U/L (ref 12–78)

## 2013-02-23 LAB — PROTIME-INR
INR: 1.5
Prothrombin Time: 17.8 secs — ABNORMAL HIGH (ref 11.5–14.7)

## 2013-02-25 ENCOUNTER — Telehealth: Payer: Self-pay | Admitting: Internal Medicine

## 2013-02-25 NOTE — Telephone Encounter (Signed)
The Dr. At ER told the patient needed to have the levels of her coumadin changed

## 2013-02-26 ENCOUNTER — Ambulatory Visit (INDEPENDENT_AMBULATORY_CARE_PROVIDER_SITE_OTHER): Payer: Medicare Other | Admitting: Internal Medicine

## 2013-02-26 ENCOUNTER — Ambulatory Visit: Payer: Self-pay | Admitting: Internal Medicine

## 2013-02-26 ENCOUNTER — Encounter: Payer: Self-pay | Admitting: Internal Medicine

## 2013-02-26 VITALS — BP 128/78 | HR 99 | Temp 98.2°F | Resp 14 | Wt 161.5 lb

## 2013-02-26 DIAGNOSIS — Z7901 Long term (current) use of anticoagulants: Secondary | ICD-10-CM | POA: Diagnosis not present

## 2013-02-26 DIAGNOSIS — K449 Diaphragmatic hernia without obstruction or gangrene: Secondary | ICD-10-CM | POA: Diagnosis not present

## 2013-02-26 DIAGNOSIS — R609 Edema, unspecified: Secondary | ICD-10-CM

## 2013-02-26 DIAGNOSIS — I82409 Acute embolism and thrombosis of unspecified deep veins of unspecified lower extremity: Secondary | ICD-10-CM

## 2013-02-26 DIAGNOSIS — M7120 Synovial cyst of popliteal space [Baker], unspecified knee: Secondary | ICD-10-CM | POA: Diagnosis not present

## 2013-02-26 DIAGNOSIS — R600 Localized edema: Secondary | ICD-10-CM

## 2013-02-26 DIAGNOSIS — M7989 Other specified soft tissue disorders: Secondary | ICD-10-CM | POA: Diagnosis not present

## 2013-02-26 DIAGNOSIS — B0229 Other postherpetic nervous system involvement: Secondary | ICD-10-CM

## 2013-02-26 LAB — PROTIME-INR: Prothrombin Time: 25.5 s — ABNORMAL HIGH (ref 10.2–12.4)

## 2013-02-26 MED ORDER — LEVOFLOXACIN 500 MG PO TABS: 500.0000 mg | ORAL_TABLET | Freq: Every day | ORAL | Status: DC

## 2013-02-26 NOTE — Progress Notes (Signed)
Patient ID: Diana [name], female   DOB: [birth year], 78 y.o.   MRN: [id number]  Patient Active Problem List   Diagnosis Date Noted  . DVT, recurrent, lower extremity, acute 01/07/2013  . Hiatal hernia 01/06/2013  . Renal mass, left 01/06/2013  . Lower extremity edema 12/20/2012  . Sciatica of right side 11/20/2012  . Post herpetic neuralgia 11/20/2012  . Disseminated zoster 10/31/2012  . Insomnia 11/08/2011  . LUPUS ERYTHEMATOSUS 06/13/2008  . NEUROPATHY-PERIPHERAL 10/06/2006  . Unspecified Hypothyroidism 09/25/2006  . ANEMIA, B12 DEFICIENCY 09/25/2006  . GERD 09/25/2006  . Systemic Lupus Erythematosus 09/25/2006  . GLUCOSE INTOLERANCE, MINIMAL, HX OF 09/25/2006  . SHINGLES, HX OF 09/25/2006  . TOBACCO ABUSE, HX OF 09/25/2006    Subjective:  CC:   Chief Complaint  Patient presents with  . Follow-up    coumadin    HPI:      Diana [name] a 78 y.o. female who presents for ER follow up.  During dinner out last Saturday she had an episode of moderate to severe nausea accompanied by light headedness.  She became concerned that her INR was prlonged because she had read about the symptoms of coagulopathy and felt tht she had all of the symptoms so she went to the ER .  Not only did they check her INR,  But an EKG., BMET and UA was done.  They did NOT check orthostatics.  Her INR was 1.5 , UA and BMET were normal.  She was prescribed Lovenox and told to follow up with PCP.  Has been taking 5 mg daily of coumadin since then but has noticed right lower leg swelling despite consistent use of Ted hose, and is worried that she may have a DVT in the other leg (the right leg).  She is going to the beach on Friday for a week with her family.    She continues to have fatigue, diffuse pain and incontinence secondary to recent hospitalization for disseminated shingles.  She is scheduled to see Dr. Achilles Dunk for incontinence management.   Chronic Right index finger lesion ,  Originally treated  as fungal infection by prior dermatologic evaluation , with no change.  Now using mupirocin with no change ,  Seeing Dr Adolphus Birchwood on June 23rd.  Discussed holding her coumadin for 2 days so he coulf biopsy it but she is leaving for the beach the next day.        Past Medical History  Diagnosis Date  . Lupus (systemic lupus erythematosus)   . Pulmonary hypertension   . Pneumonia   . Unspecified menopausal and postmenopausal disorder   . Primary pulmonary hypertension   . Acute glomerulonephritis with other specified pathological lesion in kidney in disease classified elsewhere(580.81)   . Unspecified hypothyroidism   . Shingles (herpes zoster) polyneuropathy March 2013    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    History reviewed. No pertinent past surgical history.     The following portions of the patient's history were reviewed and updated as appropriate: Allergies, current medications, and problem list.    Review of Systems:   12 Pt  review of systems was negative except those addressed in the HPI,     History   Social History  . Marital Status: Single    Spouse Name: N/A    Number of Children: N/A  . Years of Education: N/A   Occupational History  . 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 Smoking status: Former Smoker    Quit date: 05/25/1991  . Smokeless tobacco: Never Used  . Alcohol Use: Yes     Comment: rare  . Drug Use: No  . Sexually Active: Not on file   Other Topics Concern  . Not on file   Social History Narrative  . No narrative on file    Objective:  BP 128/78  Pulse 99  Temp(Src) 98.2 F (36.8 C) (Oral)  Resp 14  Wt 161 lb 8 oz (73.256 kg)  BMI 27.71 kg/m2  SpO2 98%  General appearance: alert, cooperative and appears stated age Ears: normal TM's and external ear canals both ears Throat: lips, mucosa, and tongue normal; teeth and gums normal Neck: no adenopathy, no carotid bruit, supple,  symmetrical, trachea midline and thyroid not enlarged, symmetric, no tenderness/mass/nodules Back: symmetric, no curvature. ROM normal. No CVA tenderness. Lungs: clear to auscultation bilaterally Heart: regular rate and rhythm, S1, S2 normal, no murmur, click, rub or gallop Abdomen: soft, non-tender; bowel sounds normal; no masses,  no organomegaly Pulses: 2+ and symmetric Skin: Skin color, texture, turgor normal. No rashes or lesions Lymph nodes: Cervical, supraclavicular, and axillary nodes normal.  Assessment and Plan:  Hiatal hernia She does not want to have hiatal hernia surgery .  Recommended limiting size of meals and more frequent feedings to avoid overdistension  DVT, recurrent, lower extremity, acute Right leg was scanned today and prelim report was negative.  Continue, coumadin 5 mg daily ,  INR is therapuetic,  Can stop lovenox.   Post herpetic neuralgia Managed with neurontin  Lower extremity edema Patient presented with right LE edema and pain which started yesterday. Currently on coumadin 5 mg with therapeutic PT/INR.  Venous doppler don today showed no DVT, small Baker's Cyst. Reassurance provided.    A total of 40 minutes was spent with patient more than half of which was spent in counseling, obtaining and reviewing records from ER and coordination of care. Updated Medication List Outpatient Encounter Prescriptions as of 02/26/2013  Medication Sig Dispense Refill  . cyanocobalamin (,VITAMIN B-12,) 1000 MCG/ML injection Inject 1 mL (1,000 mcg total) into the muscle every 30 (thirty) days.  10 mL  1  . estrogen, conjugated,-medroxyprogesterone (PREMPRO) 0.45-1.5 MG per tablet Take 1 tablet by mouth daily.        Marland Kitchen gabapentin (NEURONTIN) 300 MG capsule Take 400 mg by mouth every 8 (eight) hours.       . hydroxychloroquine (PLAQUENIL) 200 MG tablet TAKE 1 TABLET EVERY DAY  30 tablet  8  . levothyroxine (SYNTHROID, LEVOTHROID) 150 MCG tablet Take 1 tablet (150 mcg total) by  mouth daily.  90 tablet  1  . loratadine (CLARITIN) 10 MG tablet Take 10 mg by mouth as needed.      . mycophenolate (CELLCEPT) 500 MG tablet Take 500 mg by mouth 2 (two) times daily. Take two 500 mg tab in the morning and two  500 mg at bed time      . omeprazole (PRILOSEC) 20 MG capsule Take 40 mg by mouth daily.       . ondansetron (ZOFRAN) 4 MG tablet       . PREDNISONE PO Take 7 mg by mouth daily.       . ranitidine (ZANTAC) 150 MG tablet Take 1 tablet (150 mg total) by mouth at bedtime.  30 tablet  5  . Tadalafil, PAH, (ADCIRCA) 20 MG TABS Take by mouth 2 (two) times daily.      Marland Kitchen  vitamin D, CHOLECALCIFEROL, 400 UNITS tablet Take 400 Units by mouth daily.        Marland Kitchen warfarin (COUMADIN) 5 MG tablet Take 2 (10mg )  tablets tomorrow 12/22/12 at 5 pm followed by 1 tablet (5mg ) daily.  30 tablet  3  . HYDROcodone-acetaminophen (NORCO/VICODIN) 5-325 MG per tablet Take 1 tablet by mouth every 4 (four) hours as needed.      . Lactulose 20 GM/30ML SOLN Take 30 mLs (20 g total) by mouth every 6 (six) hours as needed (for relief of constipation).  120 mL  2  . levofloxacin (LEVAQUIN) 500 MG tablet Take 1 tablet (500 mg total) by mouth daily.  7 tablet  0  . oxyCODONE-acetaminophen (ROXICET) 5-325 MG per tablet Take 1 tablet by mouth every 4 (four) hours as needed for pain.  120 tablet  0  . valACYclovir (VALTREX) 1000 MG tablet Take 1 tablet (1,000 mg total) by mouth 3 (three) times daily.  28 tablet  0   No facility-administered encounter medications on file as of 02/26/2013.     Orders Placed This Encounter  Procedures  . Protime-INR  . Lower Extremity Venous Duplex Right    No Follow-up on file.

## 2013-02-27 ENCOUNTER — Encounter: Payer: Self-pay | Admitting: Internal Medicine

## 2013-02-27 DIAGNOSIS — B0223 Postherpetic polyneuropathy: Secondary | ICD-10-CM | POA: Insufficient documentation

## 2013-02-27 NOTE — Assessment & Plan Note (Signed)
Managed with neurontin

## 2013-02-27 NOTE — Assessment & Plan Note (Signed)
Patient presented with right LE edema and pain which started yesterday. Currently on coumadin 5 mg with therapeutic PT/INR.  Venous doppler don today showed no DVT, small Baker's Cyst. Reassurance provided.

## 2013-02-27 NOTE — Assessment & Plan Note (Signed)
Right leg was scanned today and prelim report was negative.  Continue, coumadin 5 mg daily ,  INR is therapuetic,  Can stop lovenox.

## 2013-02-27 NOTE — Assessment & Plan Note (Signed)
She does not want to have hiatal hernia surgery .  Recommended limiting size of meals and more frequent feedings to avoid overdistension

## 2013-03-06 DIAGNOSIS — R339 Retention of urine, unspecified: Secondary | ICD-10-CM | POA: Diagnosis not present

## 2013-03-06 DIAGNOSIS — N3942 Incontinence without sensory awareness: Secondary | ICD-10-CM | POA: Diagnosis not present

## 2013-03-06 DIAGNOSIS — R3129 Other microscopic hematuria: Secondary | ICD-10-CM | POA: Diagnosis not present

## 2013-03-11 ENCOUNTER — Ambulatory Visit: Payer: Self-pay | Admitting: Obstetrics and Gynecology

## 2013-03-11 DIAGNOSIS — Z803 Family history of malignant neoplasm of breast: Secondary | ICD-10-CM | POA: Diagnosis not present

## 2013-03-11 DIAGNOSIS — Z1231 Encounter for screening mammogram for malignant neoplasm of breast: Secondary | ICD-10-CM | POA: Diagnosis not present

## 2013-03-12 DIAGNOSIS — D485 Neoplasm of uncertain behavior of skin: Secondary | ICD-10-CM | POA: Diagnosis not present

## 2013-03-27 ENCOUNTER — Ambulatory Visit (INDEPENDENT_AMBULATORY_CARE_PROVIDER_SITE_OTHER): Payer: Medicare Other | Admitting: Adult Health

## 2013-03-27 ENCOUNTER — Encounter: Payer: Self-pay | Admitting: Adult Health

## 2013-03-27 ENCOUNTER — Telehealth: Payer: Self-pay | Admitting: *Deleted

## 2013-03-27 ENCOUNTER — Telehealth: Payer: Self-pay | Admitting: Internal Medicine

## 2013-03-27 VITALS — BP 120/70 | HR 100 | Temp 98.4°F | Resp 12 | Wt 162.5 lb

## 2013-03-27 DIAGNOSIS — Z7901 Long term (current) use of anticoagulants: Secondary | ICD-10-CM | POA: Insufficient documentation

## 2013-03-27 DIAGNOSIS — M329 Systemic lupus erythematosus, unspecified: Secondary | ICD-10-CM | POA: Diagnosis not present

## 2013-03-27 DIAGNOSIS — R05 Cough: Secondary | ICD-10-CM | POA: Diagnosis not present

## 2013-03-27 DIAGNOSIS — Z09 Encounter for follow-up examination after completed treatment for conditions other than malignant neoplasm: Secondary | ICD-10-CM | POA: Diagnosis not present

## 2013-03-27 MED ORDER — LEVOFLOXACIN 500 MG PO TABS: 500.0000 mg | ORAL_TABLET | Freq: Every day | ORAL | Status: DC

## 2013-03-27 NOTE — Telephone Encounter (Signed)
error 

## 2013-03-27 NOTE — Telephone Encounter (Signed)
Pt has really bad chest congestion and it is getting worse. She says she spoke to a triage nurse yesterday but i did not see a note in her chart. She was wanting antibiotics called in but I told her she would need to come in for a visit but she is saying that Dr. Derrel Nip calls her in Antibiotics ??? And wanted me to send a note to her instead ??? I told her she would need appt. But she got frustrated.

## 2013-03-27 NOTE — Progress Notes (Signed)
Subjective:    Patient ID: Diana [name], female    DOB: 1935-06-16, 78 y.o.   MRN: [id number]  HPI  Luzia is a pleasant 78 year old female with history of lupus, postherpetic neuralgia secondary to shingles, hypothyroidism, GERD who presents to clinic with complaints of sore throat and cough. Recently finished course of antibiotic for sinus infection. She completed course on Sunday. Monday she woke up with a sore throat and cough. She is coughing up yellow sputum. She denies fever or chills. No shortness of breath. She is tolerating oral intake.   Current Outpatient Prescriptions on File Prior to Visit  Medication Sig Dispense Refill  . cyanocobalamin (,VITAMIN B-12,) 1000 MCG/ML injection Inject 1 mL (1,000 mcg total) into the muscle every 30 (thirty) days.  10 mL  1  . estrogen, conjugated,-medroxyprogesterone (PREMPRO) 0.45-1.5 MG per tablet Take 1 tablet by mouth daily.        Marland Kitchen gabapentin (NEURONTIN) 300 MG capsule Take 400 mg by mouth every 8 (eight) hours.       Marland Kitchen HYDROcodone-acetaminophen (NORCO/VICODIN) 5-325 MG per tablet Take 1 tablet by mouth every 4 (four) hours as needed.      . hydroxychloroquine (PLAQUENIL) 200 MG tablet TAKE 1 TABLET EVERY DAY  30 tablet  8  . Lactulose 20 GM/30ML SOLN Take 30 mLs (20 g total) by mouth every 6 (six) hours as needed (for relief of constipation).  120 mL  2  . levothyroxine (SYNTHROID, LEVOTHROID) 150 MCG tablet Take 1 tablet (150 mcg total) by mouth daily.  90 tablet  1  . loratadine (CLARITIN) 10 MG tablet Take 10 mg by mouth as needed.      . mycophenolate (CELLCEPT) 500 MG tablet Take 500 mg by mouth 2 (two) times daily. Take two 500 mg tab in the morning and two  500 mg at bed time      . omeprazole (PRILOSEC) 20 MG capsule Take 40 mg by mouth daily.       . ondansetron (ZOFRAN) 4 MG tablet       . oxyCODONE-acetaminophen (ROXICET) 5-325 MG per tablet Take 1 tablet by mouth every 4 (four) hours as needed for pain.  120 tablet  0  .  PREDNISONE PO Take 7 mg by mouth daily.       . ranitidine (ZANTAC) 150 MG tablet Take 1 tablet (150 mg total) by mouth at bedtime.  30 tablet  5  . Tadalafil, PAH, (ADCIRCA) 20 MG TABS Take by mouth 2 (two) times daily.      . valACYclovir (VALTREX) 1000 MG tablet Take 1 tablet (1,000 mg total) by mouth 3 (three) times daily.  28 tablet  0  . vitamin D, CHOLECALCIFEROL, 400 UNITS tablet Take 400 Units by mouth daily.        Marland Kitchen warfarin (COUMADIN) 5 MG tablet Take 2 (10mg )  tablets tomorrow 12/22/12 at 5 pm followed by 1 tablet (5mg ) daily.  30 tablet  3   No current facility-administered medications on file prior to visit.    Review of Systems  Constitutional: Negative for fever and chills.  HENT: Positive for sore throat, postnasal drip and sinus pressure. Negative for trouble swallowing.   Respiratory: Positive for cough. Negative for shortness of breath and wheezing.   Cardiovascular: Negative for chest pain.  Gastrointestinal: Negative for nausea and vomiting.    BP 120/70  Pulse 100  Temp(Src) 98.4 F (36.9 C) (Oral)  Resp 12  Wt 162 lb 8 oz (73.71 kg)  BMI 27.88 kg/m2  SpO2 96%    Objective:   Physical Exam  Constitutional: She is oriented to person, place, and time. She appears well-developed and well-nourished.  HENT:  Head: Normocephalic and atraumatic.  Right Ear: External ear normal.  Left Ear: External ear normal.  Pharyngeal erythema without exudate. There is drainage in the posterior pharynx.  Cardiovascular: Normal rate, regular rhythm and normal heart sounds.  Exam reveals no gallop and no friction rub.   No murmur heard. Pulmonary/Chest: Effort normal and breath sounds normal. No respiratory distress. She has no wheezes. She has no rales.  Abdominal: Soft. Bowel sounds are normal.  Lymphadenopathy:    She has no cervical adenopathy.  Neurological: She is alert and oriented to person, place, and time.  Skin: Skin is warm and dry.  Psychiatric: She has a normal  mood and affect. Her behavior is normal. Judgment and thought content normal.      Assessment & Plan:

## 2013-03-27 NOTE — Assessment & Plan Note (Signed)
Patient with cough and sore throat after completing one week of Levaquin 500 mg daily. Continue Levaquin for 7 more days. She has Tussionex at home and will take this as needed for cough. Check PT INR since patient is on Coumadin

## 2013-03-27 NOTE — Telephone Encounter (Signed)
Patient called and left voicemail that she needed refill on ABX tried to return call to verify symptoms and no answer, left message for patient to return call. FYI. Was going to have patient come in and see Raquel Rey tomorrow but could not reach patient

## 2013-03-27 NOTE — Telephone Encounter (Signed)
Spoke with pt, complaining of productive cough, sinus congestion, chills yesterday evening. Denies fever or shortness of breath. States had some wheezing last night. Advised she would need appointment to be evaluated. Pt requesting antibiotic to be called in. States she will only come in if Dr. Derrel Nip advises.

## 2013-03-27 NOTE — Patient Instructions (Addendum)
  Please have your labs drawn today.  I am checking your PT/INR.  Once I obtain the results I will call you with new instructions regarding your Coumadin

## 2013-03-27 NOTE — Assessment & Plan Note (Signed)
Patient has been on antibiotics recently. Just completed course on Sunday. Resuming antibiotics today. Check PT/INR. Adjust Coumadin dose as necessary.

## 2013-03-27 NOTE — Telephone Encounter (Signed)
Dr. Derrel Nip recommends evaluation. Pt notified and verbalized understanding. Agrees to seeing Raquel this afternoon at 4:00.

## 2013-03-28 LAB — PROTIME-INR
INR: 2.7 ratio — ABNORMAL HIGH (ref 0.8–1.0)
Prothrombin Time: 28.3 s — ABNORMAL HIGH (ref 10.2–12.4)

## 2013-03-29 ENCOUNTER — Other Ambulatory Visit: Payer: Self-pay | Admitting: Adult Health

## 2013-03-29 DIAGNOSIS — Z7901 Long term (current) use of anticoagulants: Secondary | ICD-10-CM

## 2013-04-01 ENCOUNTER — Telehealth: Payer: Self-pay | Admitting: *Deleted

## 2013-04-01 ENCOUNTER — Other Ambulatory Visit (INDEPENDENT_AMBULATORY_CARE_PROVIDER_SITE_OTHER): Payer: Medicare Other

## 2013-04-01 DIAGNOSIS — Z7901 Long term (current) use of anticoagulants: Secondary | ICD-10-CM | POA: Diagnosis not present

## 2013-04-01 LAB — PROTIME-INR: INR: 3.5 ratio — ABNORMAL HIGH (ref 0.8–1.0)

## 2013-04-01 NOTE — Telephone Encounter (Signed)
Pt came in for labs and states that she still isn't feeling any better, she is still having a cough  [phone]

## 2013-04-01 NOTE — Telephone Encounter (Signed)
Spoke with pt, states feels about the same as 03/27/13 visit, slightly improved. States cough has improved a little, but still with yellow-gray sputum. Cough medicine helps when she takes it. Denies any worsening symptoms or changes. Has one day of Levaquin left, denies fever, shortness of breath or wheezing. Advised to continue antibiotic as prescribed and to notify office if any worsening symptoms. She is concerned she is not better after this round of Levaquin.

## 2013-04-01 NOTE — Telephone Encounter (Signed)
Pt notified. Would be willing to have chest x ray done in 1-2 days. Requests it to be done here in [place], does not want to drive to the [facility] office.

## 2013-04-01 NOTE — Telephone Encounter (Signed)
Pt came in for labs and states that she still isn't feeling any better, she is still having a cough   [phone]

## 2013-04-01 NOTE — Telephone Encounter (Signed)
I can order a chest xray to r/o pneumonia.

## 2013-04-04 ENCOUNTER — Other Ambulatory Visit: Payer: Self-pay | Admitting: *Deleted

## 2013-04-04 ENCOUNTER — Other Ambulatory Visit (INDEPENDENT_AMBULATORY_CARE_PROVIDER_SITE_OTHER): Payer: Medicare Other

## 2013-04-04 ENCOUNTER — Telehealth: Payer: Self-pay | Admitting: *Deleted

## 2013-04-04 DIAGNOSIS — Z7901 Long term (current) use of anticoagulants: Secondary | ICD-10-CM

## 2013-04-04 NOTE — Telephone Encounter (Signed)
Left message for pt to return my call.

## 2013-04-04 NOTE — Telephone Encounter (Signed)
Pt came in for labs stating she would like for someone to call her about the chest x-ray, when its ordered

## 2013-04-05 ENCOUNTER — Ambulatory Visit: Payer: Self-pay | Admitting: Internal Medicine

## 2013-04-05 ENCOUNTER — Telehealth: Payer: Self-pay | Admitting: Internal Medicine

## 2013-04-05 ENCOUNTER — Other Ambulatory Visit: Payer: Self-pay | Admitting: Internal Medicine

## 2013-04-05 DIAGNOSIS — J209 Acute bronchitis, unspecified: Secondary | ICD-10-CM | POA: Diagnosis not present

## 2013-04-05 DIAGNOSIS — K449 Diaphragmatic hernia without obstruction or gangrene: Secondary | ICD-10-CM | POA: Diagnosis not present

## 2013-04-05 LAB — PROTIME-INR: Prothrombin Time: 22.3 seconds — ABNORMAL HIGH (ref 11.6–15.2)

## 2013-04-05 NOTE — Telephone Encounter (Signed)
Her chest x ray was negative for pneumoniia or chf but showed a large hiatal hernia.  If she is still coughing but not having fevers or purulent sputum her cough may be coming from reflux, and we should try increasing her omeprazole to 40 mg twice daily .  Can you find out what symptoms she is having?

## 2013-04-05 NOTE — Telephone Encounter (Signed)
Dr Derrel Nip placed order for Chest x-ray & order was faxed to [facility] (pt aware)

## 2013-04-05 NOTE — Telephone Encounter (Signed)
Pt is calling back and says she is getting much much worse. She is very sick and thinks she may need a strong antibiotic because she has Pneumonia or come in to be seen ?? She had chest x-ray yesterday ???

## 2013-04-05 NOTE — Telephone Encounter (Signed)
i would like to wait until I see the x ray.  If she is not having fevers or purulent sputum I cannot justify a stronger antibiotic without evidence of PNA on CXR

## 2013-04-05 NOTE — Telephone Encounter (Signed)
FYI

## 2013-04-05 NOTE — Telephone Encounter (Signed)
Patient stated that this morning she was not fully awake at the time of call and that she is much worse now than the last few days. Patient stated she feels as though she has Pneumonia again and needs stronger ABX, advised patient that no X-Ray report has been called by [facility] patient stated she did not go for X-ray until 12 PM today. No appointments available unless work in at end of day, please advise.

## 2013-04-05 NOTE — Telephone Encounter (Signed)
After advising patient of results of X-Ray and notifying her of the hiatal hernia with change in dose of omeprazole. Patient stated that an X-ray does not ever show when she has pneumonia that it takes a CT scan to show that she has Pneumonia. Advised patient if she stared to run a fever or cough up mucus that dark in color or purulent in color to seek medical attention over weekend at urgent care or ED.

## 2013-04-05 NOTE — Telephone Encounter (Signed)
Spoke with pt, continues to have productive cough. Denied fever, shortness of breath, chills or aches. Has taken Levaquin x 2 weeks, completed 04/02/13. States symptoms have not worsened since last OV, but have improved only minimally. Asking about need for chest xray, does not want to drive to the [facility] office to have done. States Tussionex helps with the cough.

## 2013-04-09 DIAGNOSIS — J0100 Acute maxillary sinusitis, unspecified: Secondary | ICD-10-CM | POA: Diagnosis not present

## 2013-04-09 DIAGNOSIS — K219 Gastro-esophageal reflux disease without esophagitis: Secondary | ICD-10-CM | POA: Diagnosis not present

## 2013-04-09 DIAGNOSIS — R062 Wheezing: Secondary | ICD-10-CM | POA: Diagnosis not present

## 2013-04-15 ENCOUNTER — Other Ambulatory Visit: Payer: Self-pay | Admitting: *Deleted

## 2013-04-16 MED ORDER — WARFARIN SODIUM 5 MG PO TABS: 5.0000 mg | ORAL_TABLET | Freq: Every day | ORAL | Status: DC

## 2013-04-17 ENCOUNTER — Encounter: Payer: Self-pay | Admitting: Internal Medicine

## 2013-04-22 DIAGNOSIS — I82409 Acute embolism and thrombosis of unspecified deep veins of unspecified lower extremity: Secondary | ICD-10-CM | POA: Diagnosis not present

## 2013-04-22 DIAGNOSIS — M79609 Pain in unspecified limb: Secondary | ICD-10-CM | POA: Diagnosis not present

## 2013-04-22 DIAGNOSIS — Z79899 Other long term (current) drug therapy: Secondary | ICD-10-CM | POA: Diagnosis not present

## 2013-04-22 DIAGNOSIS — Z7901 Long term (current) use of anticoagulants: Secondary | ICD-10-CM | POA: Diagnosis not present

## 2013-04-22 DIAGNOSIS — D485 Neoplasm of uncertain behavior of skin: Secondary | ICD-10-CM | POA: Diagnosis not present

## 2013-04-22 DIAGNOSIS — C4490 Unspecified malignant neoplasm of skin, unspecified: Secondary | ICD-10-CM | POA: Diagnosis not present

## 2013-04-22 DIAGNOSIS — N059 Unspecified nephritic syndrome with unspecified morphologic changes: Secondary | ICD-10-CM | POA: Diagnosis not present

## 2013-04-22 DIAGNOSIS — B029 Zoster without complications: Secondary | ICD-10-CM | POA: Diagnosis not present

## 2013-04-22 DIAGNOSIS — R209 Unspecified disturbances of skin sensation: Secondary | ICD-10-CM | POA: Diagnosis not present

## 2013-04-22 DIAGNOSIS — M329 Systemic lupus erythematosus, unspecified: Secondary | ICD-10-CM | POA: Diagnosis not present

## 2013-04-23 DIAGNOSIS — I2729 Other secondary pulmonary hypertension: Secondary | ICD-10-CM | POA: Insufficient documentation

## 2013-04-23 DIAGNOSIS — R609 Edema, unspecified: Secondary | ICD-10-CM | POA: Diagnosis not present

## 2013-04-23 DIAGNOSIS — I270 Primary pulmonary hypertension: Secondary | ICD-10-CM | POA: Insufficient documentation

## 2013-04-23 DIAGNOSIS — I5030 Unspecified diastolic (congestive) heart failure: Secondary | ICD-10-CM | POA: Insufficient documentation

## 2013-04-25 ENCOUNTER — Encounter: Payer: Self-pay | Admitting: Internal Medicine

## 2013-04-25 DIAGNOSIS — M329 Systemic lupus erythematosus, unspecified: Secondary | ICD-10-CM | POA: Diagnosis not present

## 2013-04-25 DIAGNOSIS — R0602 Shortness of breath: Secondary | ICD-10-CM | POA: Diagnosis not present

## 2013-04-25 DIAGNOSIS — R05 Cough: Secondary | ICD-10-CM | POA: Diagnosis not present

## 2013-04-25 DIAGNOSIS — I279 Pulmonary heart disease, unspecified: Secondary | ICD-10-CM | POA: Diagnosis not present

## 2013-04-25 DIAGNOSIS — J320 Chronic maxillary sinusitis: Secondary | ICD-10-CM | POA: Diagnosis not present

## 2013-04-26 ENCOUNTER — Other Ambulatory Visit: Payer: Self-pay | Admitting: *Deleted

## 2013-04-26 MED ORDER — LEVOTHYROXINE SODIUM 150 MCG PO TABS: 150.0000 ug | ORAL_TABLET | Freq: Every day | ORAL | Status: DC

## 2013-05-07 DIAGNOSIS — R05 Cough: Secondary | ICD-10-CM | POA: Diagnosis not present

## 2013-05-07 DIAGNOSIS — I2789 Other specified pulmonary heart diseases: Secondary | ICD-10-CM | POA: Diagnosis not present

## 2013-05-07 DIAGNOSIS — J320 Chronic maxillary sinusitis: Secondary | ICD-10-CM | POA: Diagnosis not present

## 2013-05-10 ENCOUNTER — Ambulatory Visit: Payer: Self-pay | Admitting: Internal Medicine

## 2013-05-10 DIAGNOSIS — J3489 Other specified disorders of nose and nasal sinuses: Secondary | ICD-10-CM | POA: Diagnosis not present

## 2013-05-15 DIAGNOSIS — J309 Allergic rhinitis, unspecified: Secondary | ICD-10-CM | POA: Diagnosis not present

## 2013-05-15 DIAGNOSIS — R0602 Shortness of breath: Secondary | ICD-10-CM | POA: Diagnosis not present

## 2013-05-15 DIAGNOSIS — J320 Chronic maxillary sinusitis: Secondary | ICD-10-CM | POA: Diagnosis not present

## 2013-05-15 DIAGNOSIS — I279 Pulmonary heart disease, unspecified: Secondary | ICD-10-CM | POA: Diagnosis not present

## 2013-05-16 ENCOUNTER — Telehealth: Payer: Self-pay | Admitting: *Deleted

## 2013-05-16 ENCOUNTER — Other Ambulatory Visit (INDEPENDENT_AMBULATORY_CARE_PROVIDER_SITE_OTHER): Payer: Medicare Other

## 2013-05-16 DIAGNOSIS — Z7901 Long term (current) use of anticoagulants: Secondary | ICD-10-CM | POA: Diagnosis not present

## 2013-05-16 LAB — PROTIME-INR
INR: 3.6 ratio — ABNORMAL HIGH (ref 0.8–1.0)
Prothrombin Time: 37.6 s — ABNORMAL HIGH (ref 10.2–12.4)

## 2013-05-16 NOTE — Telephone Encounter (Signed)
Pt states that if she doesn't answer her phone to please leave results of her pt/inr on her voice mail

## 2013-05-17 NOTE — Telephone Encounter (Signed)
Pt notified of results, see result note.

## 2013-05-22 ENCOUNTER — Ambulatory Visit: Payer: Self-pay | Admitting: Anesthesiology

## 2013-05-22 DIAGNOSIS — B0229 Other postherpetic nervous system involvement: Secondary | ICD-10-CM | POA: Diagnosis not present

## 2013-05-22 DIAGNOSIS — M79609 Pain in unspecified limb: Secondary | ICD-10-CM | POA: Diagnosis not present

## 2013-05-22 DIAGNOSIS — Z79899 Other long term (current) drug therapy: Secondary | ICD-10-CM | POA: Diagnosis not present

## 2013-05-22 DIAGNOSIS — M4800 Spinal stenosis, site unspecified: Secondary | ICD-10-CM | POA: Diagnosis not present

## 2013-05-22 DIAGNOSIS — Z7901 Long term (current) use of anticoagulants: Secondary | ICD-10-CM | POA: Diagnosis not present

## 2013-05-22 DIAGNOSIS — M545 Low back pain: Secondary | ICD-10-CM | POA: Diagnosis not present

## 2013-05-23 DIAGNOSIS — L259 Unspecified contact dermatitis, unspecified cause: Secondary | ICD-10-CM | POA: Diagnosis not present

## 2013-05-23 DIAGNOSIS — Z85828 Personal history of other malignant neoplasm of skin: Secondary | ICD-10-CM | POA: Diagnosis not present

## 2013-05-24 ENCOUNTER — Encounter: Payer: Self-pay | Admitting: Internal Medicine

## 2013-05-24 ENCOUNTER — Ambulatory Visit (INDEPENDENT_AMBULATORY_CARE_PROVIDER_SITE_OTHER): Payer: Medicare Other | Admitting: Internal Medicine

## 2013-05-24 VITALS — BP 132/74 | HR 98 | Temp 98.4°F | Resp 12 | Ht 64.0 in | Wt 161.2 lb

## 2013-05-24 DIAGNOSIS — Z8701 Personal history of pneumonia (recurrent): Secondary | ICD-10-CM

## 2013-05-24 DIAGNOSIS — M5430 Sciatica, unspecified side: Secondary | ICD-10-CM

## 2013-05-24 DIAGNOSIS — I82509 Chronic embolism and thrombosis of unspecified deep veins of unspecified lower extremity: Secondary | ICD-10-CM

## 2013-05-24 DIAGNOSIS — J440 Chronic obstructive pulmonary disease with acute lower respiratory infection: Secondary | ICD-10-CM | POA: Diagnosis not present

## 2013-05-24 DIAGNOSIS — Z7901 Long term (current) use of anticoagulants: Secondary | ICD-10-CM

## 2013-05-24 DIAGNOSIS — M5431 Sciatica, right side: Secondary | ICD-10-CM

## 2013-05-24 MED ORDER — FLUTICASONE PROPIONATE 50 MCG/ACT NA SUSP: NASAL | Status: DC

## 2013-05-24 MED ORDER — IPRATROPIUM-ALBUTEROL 0.5-2.5 (3) MG/3ML IN SOLN: 3.0000 mL | Freq: Four times a day (QID) | RESPIRATORY_TRACT | Status: DC | PRN

## 2013-05-24 NOTE — Patient Instructions (Addendum)
Try the flonase for eustachian tube dysfunction   Flush sinuses twice daily with neil's or Simply saline (especially after being out )  Wear a mask when sitting in the doctor's office or anytime in crowds   Use purel often  I agree with the epidural to relieve your back pain .  You  Will need to stop the coumadin on Sept 15,  And use the lovenox  on saturday and sunday prior to your Tuesday procedure   Nebulizer for home use when congested to help dilate airways and break up sputum Duoneb every 6 hours,  brovana  Every 12 hours to keep airways open (ok to omit brovana if too $$$$)

## 2013-05-24 NOTE — Progress Notes (Signed)
Patient ID: Diana [name], female   DOB: March 15, 1935, 78 y.o.   MRN: [id number]  Patient Active Problem List   Diagnosis Date Noted  . H/O recurrent pneumonia 05/26/2013  . Long term (current) use of anticoagulants 03/27/2013  . DVT, recurrent, lower extremity, chronic 01/07/2013  . Hiatal hernia 01/06/2013  . Renal mass, left 01/06/2013  . Sciatica of right side 11/20/2012  . Post herpetic neuralgia 11/20/2012  . Disseminated zoster 10/31/2012  . Insomnia 11/08/2011  . LUPUS ERYTHEMATOSUS 06/13/2008  . NEUROPATHY-PERIPHERAL 10/06/2006  . Unspecified Hypothyroidism 09/25/2006  . ANEMIA, B12 DEFICIENCY 09/25/2006  . GERD 09/25/2006  . Systemic Lupus Erythematosus 09/25/2006  . GLUCOSE INTOLERANCE, MINIMAL, HX OF 09/25/2006  . SHINGLES, HX OF 09/25/2006  . TOBACCO ABUSE, HX OF 09/25/2006    Subjective:  CC:   Chief Complaint  Patient presents with  . Follow-up    HPI:   Diana [name] a 78 y.o. female who presents Followup on multiple issues.   1) recent prolonged episode of sinusitis. Patient was treated for bronchitis initially by Fleet Contras with Levaquin and follow up with her pulmonologist who repeated her Levaquin dose for persistent cough and sinus drainage. She was then evaluated by her rheumatologist at [facility] who felt that her failure to improve was due to use of CellCept for her lupus. The dose of Levaquin was increased and she completed a total of 31 days of Levaquin for complicated sinusitis. Fortunately her prolonged use of antibiotics did not result in C. difficile colitis.  2) back pain. She has been suffering from back pain for several months and is seeing an anesthesiologist at [facility] for pain management. He has recommended an epidural injection. She is wondering whether I will think this is a good idea..    Past Medical History  Diagnosis Date  . Lupus (systemic lupus erythematosus)   . Pulmonary hypertension   . Pneumonia   . Unspecified menopausal and  postmenopausal disorder   . Primary pulmonary hypertension   . Acute glomerulonephritis with other specified pathological lesion in kidney in disease classified elsewhere(580.81)   . Unspecified hypothyroidism   . Shingles (herpes zoster) polyneuropathy March 2013    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    No past surgical history on file.     The following portions of the patient's history were reviewed and updated as appropriate: Allergies, current medications, and problem list.    Review of Systems:   12 Pt  review of systems was negative except those addressed in the HPI,     History   Social History  . Marital Status: Single    Spouse Name: N/A    Number of Children: N/A  . Years of Education: N/A   Occupational History  . 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 Smoking status: Former Smoker    Quit date: 05/25/1991  . Smokeless tobacco: Never Used  . Alcohol Use: Yes     Comment: rare  . Drug Use: No  . Sexual Activity: Not on file   Other Topics Concern  . Not on file   Social History Narrative  . No narrative on file    Objective:  Filed Vitals:   05/24/13 1622  BP: 132/74  Pulse: 98  Temp: 98.4 F (36.9 C)  Resp: 12     General appearance: alert, cooperative and appears stated age Ears: normal TM's and external ear canals both ears Throat: lips, mucosa,  and tongue normal; teeth and gums normal Neck: no adenopathy, no carotid bruit, supple, symmetrical, trachea midline and thyroid not enlarged, symmetric, no tenderness/mass/nodules Back: symmetric, no curvature. ROM normal. No CVA tenderness. Lungs: clear to auscultation bilaterally Heart: regular rate and rhythm, S1, S2 normal, no murmur, click, rub or gallop Abdomen: soft, non-tender; bowel sounds normal; no masses,  no organomegaly Pulses: 2+ and symmetric Skin: Skin color, texture, turgor normal. No rashes or lesions Lymph nodes: Cervical,  supraclavicular, and axillary nodes normal.  Assessment and Plan:   DVT, recurrent, lower extremity, chronic Her INR was recently elevated due to use of Levaquin for complicates sinusitis. I have resumed 5 mg of Coumadin daily. She will need to suspend this prior to her epidural injection. We discussed stopping it 6 days ahead of time and using Lovenox the weekend prior to the epidural injection to mitigate her risk for recurrent episodes of thromboembolism.  Long term (current) use of anticoagulants She will need lifelong Coumadin or other anticoagulants for recurrent DVT. She will need to stop the coumadin on Sept 15,  And use the lovenox  on saturday and sunday prior to your Tuesday procedure   H/O recurrent pneumonia She has recurrent upper and rest and lower respiratory infections due to use of CellCept for treatment of lupus with pulmonary hypertension. We discussed ways to avoid viral and bacterial infections with coming season. I have also prescribed a home nebulizer to use duo nebs in for management of symptoms while at home.  Sciatica of right side She is planning to have an epidural injection on September 15 by her anesthesiologist/ pain clinic specialist.   A total of 30 minutes was spent with patient more than half of which was spent in counseling, reviewing records from other prviders and coordination of care.  Updated Medication List Outpatient Encounter Prescriptions as of 05/24/2013  Medication Sig Dispense Refill  . cyanocobalamin (,VITAMIN B-12,) 1000 MCG/ML injection Inject 1 mL (1,000 mcg total) into the muscle every 30 (thirty) days.  10 mL  1  . estrogen, conjugated,-medroxyprogesterone (PREMPRO) 0.45-1.5 MG per tablet Take 1 tablet by mouth daily.        Marland Kitchen gabapentin (NEURONTIN) 300 MG capsule Take 400 mg by mouth every 8 (eight) hours.       Marland Kitchen HYDROcodone-acetaminophen (NORCO/VICODIN) 5-325 MG per tablet Take 1 tablet by mouth every 4 (four) hours as needed.      .  hydroxychloroquine (PLAQUENIL) 200 MG tablet TAKE 1 TABLET EVERY DAY  30 tablet  8  . levothyroxine (SYNTHROID, LEVOTHROID) 150 MCG tablet Take 1 tablet (150 mcg total) by mouth daily.  30 tablet  0  . loratadine (CLARITIN) 10 MG tablet Take 10 mg by mouth as needed.      . mycophenolate (CELLCEPT) 500 MG tablet Take 500 mg by mouth 2 (two) times daily. Take two 500 mg tab in the morning and two  500 mg at bed time      . omeprazole (PRILOSEC) 20 MG capsule Take 40 mg by mouth daily.       . ondansetron (ZOFRAN) 4 MG tablet       . oxyCODONE-acetaminophen (ROXICET) 5-325 MG per tablet Take 1 tablet by mouth every 4 (four) hours as needed for pain.  120 tablet  0  . PREDNISONE PO Take 7 mg by mouth daily.       . ranitidine (ZANTAC) 150 MG tablet Take 1 tablet (150 mg total) by mouth at bedtime.  30 tablet  5  . Tadalafil, PAH, (ADCIRCA) 20 MG TABS Take 2 tablets by mouth daily.       . vitamin D, CHOLECALCIFEROL, 400 UNITS tablet Take 400 Units by mouth daily.        Marland Kitchen warfarin (COUMADIN) 5 MG tablet Take 1 tablet (5 mg total) by mouth daily. Take as directed  30 tablet  5  . fluticasone (FLONASE) 50 MCG/ACT nasal spray 2 sprays in each nostril once daily  16 g  6  . ipratropium-albuterol (DUONEB) 0.5-2.5 (3) MG/3ML SOLN Take 3 mLs by nebulization every 6 (six) hours as needed.  360 mL  1  . Lactulose 20 GM/30ML SOLN Take 30 mLs (20 g total) by mouth every 6 (six) hours as needed (for relief of constipation).  120 mL  2  . levofloxacin (LEVAQUIN) 500 MG tablet Take 1 tablet (500 mg total) by mouth daily.  7 tablet  0  . valACYclovir (VALTREX) 1000 MG tablet Take 1 tablet (1,000 mg total) by mouth 3 (three) times daily.  28 tablet  0   No facility-administered encounter medications on file as of 05/24/2013.

## 2013-05-25 LAB — PROTIME-INR: INR: 1.82 — ABNORMAL HIGH (ref <1.50)

## 2013-05-26 DIAGNOSIS — Z8701 Personal history of pneumonia (recurrent): Secondary | ICD-10-CM | POA: Insufficient documentation

## 2013-05-26 NOTE — Assessment & Plan Note (Signed)
She has recurrent upper and rest and lower respiratory infections due to use of CellCept for treatment of lupus with pulmonary hypertension. We discussed ways to avoid viral and bacterial infections with coming season. I have also prescribed a home nebulizer to use duo nebs in for management of symptoms while at home.

## 2013-05-26 NOTE — Assessment & Plan Note (Signed)
Her INR was recently elevated due to use of Levaquin for complicates sinusitis. I have resumed 5 mg of Coumadin daily. She will need to suspend this prior to her epidural injection. We discussed stopping it 6 days ahead of time and using Lovenox the weekend prior to the epidural injection to mitigate her risk for recurrent episodes of thromboembolism.

## 2013-05-26 NOTE — Assessment & Plan Note (Signed)
She is planning to have an epidural injection on September 15 by her anesthesiologist/ pain clinic specialist.

## 2013-05-26 NOTE — Assessment & Plan Note (Addendum)
She will need lifelong Coumadin or other anticoagulants for recurrent DVT. She will need to stop the coumadin on Sept 15,  And use the lovenox  on saturday and sunday prior to your Tuesday procedure

## 2013-05-27 ENCOUNTER — Telehealth: Payer: Self-pay | Admitting: *Deleted

## 2013-05-27 NOTE — Telephone Encounter (Signed)
Faxed order for nebulizer to [facility] called Rodena Piety confirmed receipt of fax, [facility] to send a DWR form.

## 2013-05-31 ENCOUNTER — Other Ambulatory Visit: Payer: Self-pay | Admitting: *Deleted

## 2013-05-31 MED ORDER — LEVOTHYROXINE SODIUM 150 MCG PO TABS: 150.0000 ug | ORAL_TABLET | Freq: Every day | ORAL | Status: DC

## 2013-05-31 NOTE — Telephone Encounter (Signed)
Ok to refill 30 days,  Sent in see other message too

## 2013-05-31 NOTE — Telephone Encounter (Signed)
Needs labs prior to next refill last tsh 2013!!

## 2013-05-31 NOTE — Telephone Encounter (Signed)
Does pt need labs (TSH) prior to refills?

## 2013-05-31 NOTE — Telephone Encounter (Signed)
Pt notified & scheduled lab appt on Monday

## 2013-05-31 NOTE — Telephone Encounter (Signed)
Yes,  Way overdue,  make appt for fasting labs.  asap

## 2013-06-03 ENCOUNTER — Telehealth: Payer: Self-pay | Admitting: *Deleted

## 2013-06-03 ENCOUNTER — Other Ambulatory Visit (INDEPENDENT_AMBULATORY_CARE_PROVIDER_SITE_OTHER): Payer: Medicare Other

## 2013-06-03 DIAGNOSIS — R739 Hyperglycemia, unspecified: Secondary | ICD-10-CM

## 2013-06-03 DIAGNOSIS — Z79899 Other long term (current) drug therapy: Secondary | ICD-10-CM | POA: Diagnosis not present

## 2013-06-03 DIAGNOSIS — R5381 Other malaise: Secondary | ICD-10-CM

## 2013-06-03 DIAGNOSIS — E785 Hyperlipidemia, unspecified: Secondary | ICD-10-CM | POA: Diagnosis not present

## 2013-06-03 DIAGNOSIS — R5383 Other fatigue: Secondary | ICD-10-CM

## 2013-06-03 NOTE — Telephone Encounter (Signed)
What labs and dx?  

## 2013-06-04 LAB — COMPREHENSIVE METABOLIC PANEL
Albumin: 3.7 g/dL (ref 3.5–5.2)
BUN: 17 mg/dL (ref 6–23)
CO2: 28 mEq/L (ref 19–32)
Calcium: 8.8 mg/dL (ref 8.4–10.5)
Chloride: 105 mEq/L (ref 96–112)
Creatinine, Ser: 1.1 mg/dL (ref 0.4–1.2)
GFR: 49.7 mL/min — ABNORMAL LOW (ref >60.00)
Glucose, Bld: 148 mg/dL — ABNORMAL HIGH (ref 70–99)

## 2013-06-04 LAB — CBC WITH DIFFERENTIAL/PLATELET
Basophils Relative: 0.4 % (ref 0.0–3.0)
Eosinophils Absolute: 0.1 10*3/uL (ref 0.0–0.7)
Eosinophils Relative: 1.3 % (ref 0.0–5.0)
Hemoglobin: 11.7 g/dL — ABNORMAL LOW (ref 12.0–15.0)
Lymphocytes Relative: 16 % (ref 12.0–46.0)
MCHC: 31.9 g/dL (ref 30.0–36.0)
Monocytes Relative: 6.9 % (ref 3.0–12.0)
Neutro Abs: 4.9 10*3/uL (ref 1.4–7.7)
RBC: 4.2 Mil/uL (ref 3.87–5.11)
WBC: 6.5 10*3/uL (ref 4.5–10.5)

## 2013-06-04 LAB — LIPID PANEL
Cholesterol: 168 mg/dL (ref 0–200)
Triglycerides: 182 mg/dL — ABNORMAL HIGH (ref 0.0–149.0)

## 2013-06-04 LAB — TSH: TSH: 6.79 u[IU]/mL — ABNORMAL HIGH (ref 0.35–5.50)

## 2013-06-05 DIAGNOSIS — R0602 Shortness of breath: Secondary | ICD-10-CM | POA: Diagnosis not present

## 2013-06-05 DIAGNOSIS — Z86010 Personal history of colonic polyps: Secondary | ICD-10-CM | POA: Diagnosis not present

## 2013-06-05 DIAGNOSIS — Z800 Family history of malignant neoplasm of digestive organs: Secondary | ICD-10-CM | POA: Diagnosis not present

## 2013-06-05 MED ORDER — SYNTHROID 150 MCG PO TABS: 150.0000 ug | ORAL_TABLET | Freq: Every day | ORAL | Status: DC

## 2013-06-05 MED ORDER — LEVOTHYROXINE SODIUM 175 MCG PO TABS: 175.0000 ug | ORAL_TABLET | Freq: Every day | ORAL | Status: DC

## 2013-06-05 NOTE — Addendum Note (Signed)
Addended by: Crecencio Mc on: 06/05/2013 08:24 AM   Modules accepted: Orders

## 2013-06-05 NOTE — Addendum Note (Signed)
Addended by: Crecencio Mc on: 06/05/2013 08:26 AM   Modules accepted: Orders

## 2013-06-05 NOTE — Addendum Note (Signed)
Addended by: Crecencio Mc on: 06/05/2013 09:45 PM   Modules accepted: Orders, Medications

## 2013-06-07 ENCOUNTER — Other Ambulatory Visit (INDEPENDENT_AMBULATORY_CARE_PROVIDER_SITE_OTHER): Payer: Medicare Other

## 2013-06-07 DIAGNOSIS — R7309 Other abnormal glucose: Secondary | ICD-10-CM | POA: Diagnosis not present

## 2013-06-07 DIAGNOSIS — R739 Hyperglycemia, unspecified: Secondary | ICD-10-CM

## 2013-06-07 DIAGNOSIS — Z7901 Long term (current) use of anticoagulants: Secondary | ICD-10-CM

## 2013-06-07 DIAGNOSIS — E785 Hyperlipidemia, unspecified: Secondary | ICD-10-CM

## 2013-06-07 LAB — HEMOGLOBIN A1C: Hgb A1c MFr Bld: 7.2 % — ABNORMAL HIGH (ref 4.6–6.5)

## 2013-06-07 LAB — PROTIME-INR: Prothrombin Time: 23.2 s — ABNORMAL HIGH (ref 10.2–12.4)

## 2013-06-11 ENCOUNTER — Ambulatory Visit: Payer: Self-pay | Admitting: Anesthesiology

## 2013-06-11 DIAGNOSIS — M79609 Pain in unspecified limb: Secondary | ICD-10-CM | POA: Diagnosis not present

## 2013-06-11 DIAGNOSIS — M48061 Spinal stenosis, lumbar region without neurogenic claudication: Secondary | ICD-10-CM | POA: Diagnosis not present

## 2013-06-11 DIAGNOSIS — M545 Low back pain: Secondary | ICD-10-CM | POA: Diagnosis not present

## 2013-06-11 NOTE — Addendum Note (Signed)
Addended by: Crecencio Mc on: 06/11/2013 05:01 PM   Modules accepted: Orders

## 2013-06-13 DIAGNOSIS — M329 Systemic lupus erythematosus, unspecified: Secondary | ICD-10-CM | POA: Diagnosis not present

## 2013-06-13 DIAGNOSIS — N058 Unspecified nephritic syndrome with other morphologic changes: Secondary | ICD-10-CM | POA: Diagnosis not present

## 2013-07-05 ENCOUNTER — Other Ambulatory Visit: Payer: Medicare Other

## 2013-07-05 ENCOUNTER — Telehealth: Payer: Self-pay | Admitting: Internal Medicine

## 2013-07-05 ENCOUNTER — Other Ambulatory Visit: Payer: Self-pay | Admitting: Internal Medicine

## 2013-07-05 MED ORDER — LEVOFLOXACIN 500 MG PO TABS: 500.0000 mg | ORAL_TABLET | Freq: Every day | ORAL | Status: DC

## 2013-07-05 NOTE — Telephone Encounter (Signed)
i called in levaquin to pharmacy.  Please tell her to take a probiotic with it and for at leats 2 weeks to prevent  c dificile colitis.

## 2013-07-05 NOTE — Telephone Encounter (Signed)
Pt notified and verbalized understanding.

## 2013-07-05 NOTE — Telephone Encounter (Signed)
Head all stopped up, left ear hurts, drainage in throat going into chest, thick yellow drainage. Just started yesterday afternoon.  No appt available.  States it is just like the last time she felt like this and is asking if she can just have something called in.

## 2013-07-11 ENCOUNTER — Telehealth: Payer: Self-pay | Admitting: *Deleted

## 2013-07-11 ENCOUNTER — Other Ambulatory Visit (INDEPENDENT_AMBULATORY_CARE_PROVIDER_SITE_OTHER): Payer: Medicare Other

## 2013-07-11 DIAGNOSIS — Z7901 Long term (current) use of anticoagulants: Secondary | ICD-10-CM | POA: Diagnosis not present

## 2013-07-11 DIAGNOSIS — R7309 Other abnormal glucose: Secondary | ICD-10-CM

## 2013-07-11 DIAGNOSIS — E785 Hyperlipidemia, unspecified: Secondary | ICD-10-CM | POA: Diagnosis not present

## 2013-07-11 DIAGNOSIS — R739 Hyperglycemia, unspecified: Secondary | ICD-10-CM

## 2013-07-11 LAB — COMPREHENSIVE METABOLIC PANEL
AST: 26 U/L (ref 0–37)
Albumin: 3.7 g/dL (ref 3.5–5.2)
Alkaline Phosphatase: 47 U/L (ref 39–117)
BUN: 20 mg/dL (ref 6–23)
CO2: 26 mEq/L (ref 19–32)
Calcium: 8.9 mg/dL (ref 8.4–10.5)
Chloride: 107 mEq/L (ref 96–112)
Glucose, Bld: 113 mg/dL — ABNORMAL HIGH (ref 70–99)
Potassium: 3.9 mEq/L (ref 3.5–5.1)
Sodium: 143 mEq/L (ref 135–145)
Total Protein: 6.5 g/dL (ref 6.0–8.3)

## 2013-07-11 LAB — MICROALBUMIN / CREATININE URINE RATIO
Creatinine,U: 212.8 mg/dL
Microalb Creat Ratio: 1.4 mg/g (ref 0.0–30.0)
Microalb, Ur: 2.9 mg/dL — ABNORMAL HIGH (ref 0.0–1.9)

## 2013-07-11 LAB — LIPID PANEL
Cholesterol: 152 mg/dL (ref 0–200)
LDL Cholesterol: 51 mg/dL (ref 0–99)
Total CHOL/HDL Ratio: 2

## 2013-07-11 MED ORDER — ENOXAPARIN SODIUM 80 MG/0.8ML ~~LOC~~ SOLN: 1.0000 mg/kg | Freq: Two times a day (BID) | SUBCUTANEOUS | Status: DC

## 2013-07-11 MED ORDER — LEVOFLOXACIN 500 MG PO TABS: 500.0000 mg | ORAL_TABLET | Freq: Every day | ORAL | Status: DC

## 2013-07-11 NOTE — Telephone Encounter (Signed)
Confirm that she is stopping the coumadin today, because her INR is not back yet and the levaquin will make it higher. Levaquin and lovenox sent to pharmacy.  She will  inject 75 mg of lovenox every 12 hours over the weekend.  Her last dose of lovenox  must be 12 or more hours prior to the procedure or whatever the anesthesiologist  Has told her if he wants it stopped sooner.

## 2013-07-11 NOTE — Telephone Encounter (Signed)
Patient stated she is to receive epidural on 10/28 / 14 and is concerned that the continued drainage will cause a postponement of treatment. Patient also needs script for Lovenox, please advise

## 2013-07-11 NOTE — Telephone Encounter (Signed)
Patient notified as requested and patient has stopped coumadin as requested. FYI

## 2013-07-11 NOTE — Telephone Encounter (Signed)
Pt called and stated that she is completely out of Lovenox and needs syringes.  She will need this refilled if Dr. Derrel Nip wants her to take this over the weekend.

## 2013-07-11 NOTE — Telephone Encounter (Signed)
Pt stated she finished antibiotic today and she is still having pain behind eyes and nose drainage, pt says she needs to have another breathing treatment on Tuesday  10.28.2014 and states she needs to be well for that

## 2013-07-12 ENCOUNTER — Encounter: Payer: Self-pay | Admitting: *Deleted

## 2013-08-01 ENCOUNTER — Ambulatory Visit: Payer: Self-pay | Admitting: Anesthesiology

## 2013-08-01 DIAGNOSIS — Z79899 Other long term (current) drug therapy: Secondary | ICD-10-CM | POA: Diagnosis not present

## 2013-08-01 DIAGNOSIS — B0222 Postherpetic trigeminal neuralgia: Secondary | ICD-10-CM | POA: Diagnosis not present

## 2013-08-01 DIAGNOSIS — M48061 Spinal stenosis, lumbar region without neurogenic claudication: Secondary | ICD-10-CM | POA: Diagnosis not present

## 2013-08-12 ENCOUNTER — Other Ambulatory Visit: Payer: Medicare Other

## 2013-08-12 ENCOUNTER — Encounter (INDEPENDENT_AMBULATORY_CARE_PROVIDER_SITE_OTHER): Payer: Self-pay

## 2013-08-12 DIAGNOSIS — Z79899 Other long term (current) drug therapy: Secondary | ICD-10-CM | POA: Diagnosis not present

## 2013-08-12 DIAGNOSIS — M899 Disorder of bone, unspecified: Secondary | ICD-10-CM | POA: Diagnosis not present

## 2013-08-12 DIAGNOSIS — Z86718 Personal history of other venous thrombosis and embolism: Secondary | ICD-10-CM | POA: Diagnosis not present

## 2013-08-12 DIAGNOSIS — M329 Systemic lupus erythematosus, unspecified: Secondary | ICD-10-CM | POA: Diagnosis not present

## 2013-08-12 DIAGNOSIS — Z7901 Long term (current) use of anticoagulants: Secondary | ICD-10-CM | POA: Diagnosis not present

## 2013-08-12 DIAGNOSIS — R894 Abnormal immunological findings in specimens from other organs, systems and tissues: Secondary | ICD-10-CM | POA: Diagnosis not present

## 2013-08-19 DIAGNOSIS — I279 Pulmonary heart disease, unspecified: Secondary | ICD-10-CM | POA: Diagnosis not present

## 2013-08-19 DIAGNOSIS — I5040 Unspecified combined systolic (congestive) and diastolic (congestive) heart failure: Secondary | ICD-10-CM | POA: Diagnosis not present

## 2013-08-19 DIAGNOSIS — R0602 Shortness of breath: Secondary | ICD-10-CM | POA: Diagnosis not present

## 2013-08-20 ENCOUNTER — Encounter: Payer: Self-pay | Admitting: Internal Medicine

## 2013-08-20 ENCOUNTER — Ambulatory Visit (INDEPENDENT_AMBULATORY_CARE_PROVIDER_SITE_OTHER): Payer: Medicare Other | Admitting: Internal Medicine

## 2013-08-20 VITALS — BP 128/78 | HR 94 | Temp 98.1°F | Resp 14 | Ht 64.0 in | Wt 155.2 lb

## 2013-08-20 DIAGNOSIS — E119 Type 2 diabetes mellitus without complications: Secondary | ICD-10-CM

## 2013-08-20 DIAGNOSIS — E039 Hypothyroidism, unspecified: Secondary | ICD-10-CM

## 2013-08-20 DIAGNOSIS — E785 Hyperlipidemia, unspecified: Secondary | ICD-10-CM | POA: Diagnosis not present

## 2013-08-20 DIAGNOSIS — Z7901 Long term (current) use of anticoagulants: Secondary | ICD-10-CM

## 2013-08-20 LAB — HM DIABETES FOOT EXAM: HM Diabetic Foot Exam: NORMAL

## 2013-08-20 LAB — PROTIME-INR: INR: 2 ratio — ABNORMAL HIGH (ref 0.8–1.0)

## 2013-08-20 MED ORDER — OXYCODONE-ACETAMINOPHEN 5-325 MG PO TABS: 1.0000 | ORAL_TABLET | ORAL | Status: DC | PRN

## 2013-08-20 MED ORDER — OXYCODONE-ACETAMINOPHEN 5-325 MG PO TABS: 1.0000 | ORAL_TABLET | Freq: Three times a day (TID) | ORAL | Status: DC | PRN

## 2013-08-20 NOTE — Progress Notes (Signed)
Patient ID: Diana [name], female   DOB: 07-11-1935, 78 y.o.   MRN: [id number]  Patient Active Problem List   Diagnosis Date Noted  . Other and unspecified hyperlipidemia 08/21/2013  . H/O recurrent pneumonia 05/26/2013  . Long term (current) use of anticoagulants 03/27/2013  . DVT, recurrent, lower extremity, chronic 01/07/2013  . Hiatal hernia 01/06/2013  . Renal mass, left 01/06/2013  . Sciatica of right side 11/20/2012  . Post herpetic neuralgia 11/20/2012  . Disseminated zoster 10/31/2012  . Insomnia 11/08/2011  . LUPUS ERYTHEMATOSUS 06/13/2008  . NEUROPATHY-PERIPHERAL 10/06/2006  . Unspecified Hypothyroidism 09/25/2006  . ANEMIA, B12 DEFICIENCY 09/25/2006  . GERD 09/25/2006  . Systemic Lupus Erythematosus 09/25/2006  . Type II or unspecified type diabetes mellitus without mention of complication, not stated as uncontrolled 09/25/2006  . SHINGLES, HX OF 09/25/2006  . TOBACCO ABUSE, HX OF 09/25/2006    Subjective:  CC:   Chief Complaint  Patient presents with  . Follow-up  . Diabetes    HPI:   Diana [name] a 78 y.o. female who presents Follow up on newly diagnosed Type 2 DM, history of DVT,  Chronic low back pain  SLE, and recent episode of shingles.     She has been feeling a little better since she has been getting treatment for back pain.  She has been scheduled for her  3rd epidural by Dr Pernell Dupre which provides transient relif of back pain  Post herpetic neuralgia persistent.   She is following a low glycemic index diet and has lost 6 lbs since September.  Does not check blood sugars very often .  fastings < 130 and post prandials have never been > 150  Leg swelling improving ,  But still has no exercise tolerance and gets dyspneic with minimal. Exertion.      Past Medical History  Diagnosis Date  . Lupus (systemic lupus erythematosus)   . Pulmonary hypertension   . Pneumonia   . Unspecified menopausal and postmenopausal disorder   . Primary pulmonary  hypertension   . Acute glomerulonephritis with other specified pathological lesion in kidney in disease classified elsewhere(580.81)   . Unspecified hypothyroidism   . Shingles (herpes zoster) polyneuropathy March 2013    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    No past surgical history on file.     The following portions of the patient's history were reviewed and updated as appropriate: Allergies, current medications, and problem list.    Review of Systems:   12 Pt  review of systems was negative except those addressed in the HPI,     History   Social History  . Marital Status: Single    Spouse Name: N/A    Number of Children: N/A  . Years of Education: N/A   Occupational History  . 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 Smoking status: Former Smoker    Quit date: 05/25/1991  . Smokeless tobacco: Never Used  . Alcohol Use: Yes     Comment: rare  . Drug Use: No  . Sexual Activity: Not on file   Other Topics Concern  . Not on file   Social History Narrative  . No narrative on file    Objective:  Filed Vitals:   08/20/13 1124  BP: 128/78  Pulse: 94  Temp: 98.1 F (36.7 C)  Resp: 14     General appearance: alert, cooperative and appears stated age Ears: normal TM's and  external ear canals both ears Throat: lips, mucosa, and tongue normal; teeth and gums normal Neck: no adenopathy, no carotid bruit, supple, symmetrical, trachea midline and thyroid not enlarged, symmetric, no tenderness/mass/nodules Back: symmetric, no curvature. ROM normal. No CVA tenderness. Lungs: clear to auscultation bilaterally Heart: regular rate and rhythm, S1, S2 normal, no murmur, click, rub or gallop Abdomen: soft, non-tender; bowel sounds normal; no masses,  no organomegaly Pulses: 2+ and symmetric Skin: Skin color, texture, turgor normal. No rashes or lesions Lymph nodes: Cervical, supraclavicular, and axillary nodes  normal.  Assessment and Plan:  Type II or unspecified type diabetes mellitus without mention of complication, not stated as uncontrolled Lab Results  Component Value Date   HGBA1C 7.0* 07/11/2013   Managed with dietary restraints.  IImproved control on diet alone .  hemoglobin A1c   7.0 . Patient is up-to-date on eye exams and foot exam was done today.  There is  no proteinuria on today's micro urinalysis .  Fasting lipids have been reviewed and statin therapy has been advised and updated according to new ACC guidelines based on patient's 10 year risk of CAD   Unspecified Hypothyroidism Lab Results  Component Value Date   TSH 9.26* 06/07/2013   \ Hermedication was changed to Name brand only after last TSH was elevated and has not been rechecked.  Will recheck in January with other labs.    Other and unspecified hyperlipidemia New ACC guidelines recommend starting patients aged 25 or higher on moderate intensity statin therapy for diabetes and concurrent LDL between 70-189.  Will recommend trial of simvastatin or low dose atorvastatin   Long term (current) use of anticoagulants Coumadin level is therapeutic,  Continue current regimen and repeat PT/INR in one month./     Updated Medication List Outpatient Encounter Prescriptions as of 08/20/2013  Medication Sig  . cyanocobalamin (,VITAMIN B-12,) 1000 MCG/ML injection Inject 1 mL (1,000 mcg total) into the muscle every 30 (thirty) days.  Marland Kitchen estrogen, conjugated,-medroxyprogesterone (PREMPRO) 0.45-1.5 MG per tablet Take 1 tablet by mouth daily.    . fluticasone (FLONASE) 50 MCG/ACT nasal spray 2 sprays in each nostril once daily  . gabapentin (NEURONTIN) 300 MG capsule Take 400 mg by mouth every 8 (eight) hours.   Marland Kitchen HYDROcodone-acetaminophen (NORCO/VICODIN) 5-325 MG per tablet Take 1 tablet by mouth every 4 (four) hours as needed.  . hydroxychloroquine (PLAQUENIL) 200 MG tablet TAKE 1 TABLET EVERY DAY  . loratadine (CLARITIN) 10 MG  tablet Take 10 mg by mouth as needed.  . mycophenolate (CELLCEPT) 500 MG tablet Take 500 mg by mouth 2 (two) times daily. Take two 500 mg tab in the morning and two  500 mg at bed time  . omeprazole (PRILOSEC) 20 MG capsule Take 40 mg by mouth daily.   Marland Kitchen oxyCODONE-acetaminophen (ROXICET) 5-325 MG per tablet Take 1 tablet by mouth every 4 (four) hours as needed.  Marland Kitchen PREDNISONE PO Take 7 mg by mouth daily.   . ranitidine (ZANTAC) 150 MG tablet Take 1 tablet (150 mg total) by mouth at bedtime.  Marland Kitchen SYNTHROID 150 MCG tablet Take 1 tablet (150 mcg total) by mouth daily. Name brand only  . Tadalafil, PAH, (ADCIRCA) 20 MG TABS Take 2 tablets by mouth daily.   . vitamin D, CHOLECALCIFEROL, 400 UNITS tablet Take 400 Units by mouth daily.    Marland Kitchen warfarin (COUMADIN) 5 MG tablet Take 1 tablet (5 mg total) by mouth daily. Take as directed  . [DISCONTINUED] oxyCODONE-acetaminophen (ROXICET) 5-325 MG per  tablet Take 1 tablet by mouth every 4 (four) hours as needed for pain.  Marland Kitchen enoxaparin (LOVENOX) 80 MG/0.8ML injection Inject 0.75 mLs (75 mg total) into the skin every 12 (twelve) hours.  Marland Kitchen ipratropium-albuterol (DUONEB) 0.5-2.5 (3) MG/3ML SOLN Take 3 mLs by nebulization every 6 (six) hours as needed.  . Lactulose 20 GM/30ML SOLN Take 30 mLs (20 g total) by mouth every 6 (six) hours as needed (for relief of constipation).  Marland Kitchen levofloxacin (LEVAQUIN) 500 MG tablet Take 1 tablet (500 mg total) by mouth daily.  . ondansetron (ZOFRAN) 4 MG tablet   . oxyCODONE-acetaminophen (ROXICET) 5-325 MG per tablet Take 1 tablet by mouth every 8 (eight) hours as needed for severe pain.  . valACYclovir (VALTREX) 1000 MG tablet Take 1 tablet (1,000 mg total) by mouth 3 (three) times daily.   A total of 40 minutes was spent with patient more than half of which was spent in counseling, reviewing records from other prviders and coordination of care.   Orders Placed This Encounter  Procedures  . Protime-INR  . HM DIABETES EYE EXAM  .  HM DIABETES FOOT EXAM    No Follow-up on file.

## 2013-08-20 NOTE — Patient Instructions (Signed)
Return for fasting labs after Jan 23rd to recheck your A1c  You may Add gabapentin 400 mg at bedtime if the percocet does not control your pain

## 2013-08-21 ENCOUNTER — Telehealth: Payer: Self-pay | Admitting: Internal Medicine

## 2013-08-21 DIAGNOSIS — E785 Hyperlipidemia, unspecified: Secondary | ICD-10-CM | POA: Insufficient documentation

## 2013-08-21 DIAGNOSIS — E7849 Other hyperlipidemia: Secondary | ICD-10-CM | POA: Insufficient documentation

## 2013-08-21 DIAGNOSIS — E039 Hypothyroidism, unspecified: Secondary | ICD-10-CM

## 2013-08-21 DIAGNOSIS — Z7901 Long term (current) use of anticoagulants: Secondary | ICD-10-CM

## 2013-08-21 DIAGNOSIS — E1059 Type 1 diabetes mellitus with other circulatory complications: Secondary | ICD-10-CM

## 2013-08-21 NOTE — Assessment & Plan Note (Addendum)
Lab Results  Component Value Date   TSH 9.26* 06/07/2013   \ Hermedication was changed to Name brand only after last TSH was elevated and has not been rechecked.  Will recheck in January with other labs.

## 2013-08-21 NOTE — Assessment & Plan Note (Addendum)
Lab Results  Component Value Date   HGBA1C 7.0* 07/11/2013   Managed with dietary restraints.  IImproved control on diet alone .  hemoglobin A1c   7.0 . Patient is up-to-date on eye exams and foot exam was done today.  There is  no proteinuria on today's micro urinalysis .  Fasting lipids have been reviewed and statin therapy has been advised and updated according to new ACC guidelines based on patient's 10 year risk of CAD

## 2013-08-21 NOTE — Assessment & Plan Note (Signed)
Coumadin level is therapeutic,  Continue current regimen and repeat PT/INR in one month 

## 2013-08-21 NOTE — Assessment & Plan Note (Signed)
New ACC guidelines recommend starting patients aged 76 or higher on moderate intensity statin therapy for diabetes and concurrent LDL between 70-189.  Will recommend trial of simvastatin or low dose atorvastatin

## 2013-08-21 NOTE — Telephone Encounter (Signed)
Coumadin level is therapeutic,  Continue current regimen and repeat PT/INR in one month. I would like to repeat fasting labs on or after January 23rd , and have her follow up with me afterward to discuss

## 2013-08-22 NOTE — Telephone Encounter (Signed)
Diana [name] notified her yesterday of PT/INR, FYI, not of rest of note.

## 2013-08-22 NOTE — Telephone Encounter (Signed)
Patient is coming in for PT INR on 09/23/12 and already has a follow up appointment with you for 10/11/12 for 15 min visit , would you want me to schedule the lab for 23 and the appointment with you for later date and does this need to be OV for 30 min please advise?

## 2013-08-23 NOTE — Telephone Encounter (Signed)
Left message for patient to return my call.

## 2013-08-23 NOTE — Telephone Encounter (Signed)
Yes, that would be better,  Thank you!

## 2013-08-28 NOTE — Telephone Encounter (Signed)
All labs and follow up scheduled.

## 2013-08-29 DIAGNOSIS — R0602 Shortness of breath: Secondary | ICD-10-CM | POA: Diagnosis not present

## 2013-09-03 DIAGNOSIS — I2789 Other specified pulmonary heart diseases: Secondary | ICD-10-CM | POA: Diagnosis not present

## 2013-09-03 DIAGNOSIS — M549 Dorsalgia, unspecified: Secondary | ICD-10-CM | POA: Diagnosis not present

## 2013-09-03 DIAGNOSIS — J329 Chronic sinusitis, unspecified: Secondary | ICD-10-CM | POA: Diagnosis not present

## 2013-09-04 ENCOUNTER — Ambulatory Visit: Payer: Self-pay | Admitting: Anesthesiology

## 2013-09-04 DIAGNOSIS — M545 Low back pain: Secondary | ICD-10-CM | POA: Diagnosis not present

## 2013-09-04 DIAGNOSIS — M48061 Spinal stenosis, lumbar region without neurogenic claudication: Secondary | ICD-10-CM | POA: Diagnosis not present

## 2013-09-10 DIAGNOSIS — R05 Cough: Secondary | ICD-10-CM | POA: Diagnosis not present

## 2013-09-10 DIAGNOSIS — J328 Other chronic sinusitis: Secondary | ICD-10-CM | POA: Diagnosis not present

## 2013-09-10 DIAGNOSIS — J018 Other acute sinusitis: Secondary | ICD-10-CM | POA: Diagnosis not present

## 2013-09-16 ENCOUNTER — Telehealth: Payer: Self-pay | Admitting: *Deleted

## 2013-09-17 ENCOUNTER — Other Ambulatory Visit: Payer: Self-pay | Admitting: *Deleted

## 2013-09-18 MED ORDER — SYNTHROID 150 MCG PO TABS: 150.0000 ug | ORAL_TABLET | Freq: Every day | ORAL | Status: DC

## 2013-09-18 NOTE — Telephone Encounter (Signed)
error 

## 2013-09-19 HISTORY — PX: BREAST LUMPECTOMY: SHX2

## 2013-09-19 HISTORY — PX: BREAST BIOPSY: SHX20

## 2013-09-23 ENCOUNTER — Telehealth: Payer: Self-pay | Admitting: *Deleted

## 2013-09-23 ENCOUNTER — Other Ambulatory Visit (INDEPENDENT_AMBULATORY_CARE_PROVIDER_SITE_OTHER): Payer: Medicare Other

## 2013-09-23 DIAGNOSIS — Z7901 Long term (current) use of anticoagulants: Secondary | ICD-10-CM

## 2013-09-23 LAB — PROTIME-INR
INR: 1.9 ratio — ABNORMAL HIGH (ref 0.8–1.0)
PROTHROMBIN TIME: 19.6 s — AB (ref 10.2–12.4)

## 2013-09-23 NOTE — Telephone Encounter (Signed)
Pt stated that she has been on an antibiotic for 21 days from the ENT doctor   FYI

## 2013-09-24 DIAGNOSIS — J018 Other acute sinusitis: Secondary | ICD-10-CM | POA: Diagnosis not present

## 2013-09-24 DIAGNOSIS — R05 Cough: Secondary | ICD-10-CM | POA: Diagnosis not present

## 2013-09-24 DIAGNOSIS — R059 Cough, unspecified: Secondary | ICD-10-CM | POA: Diagnosis not present

## 2013-09-24 DIAGNOSIS — J328 Other chronic sinusitis: Secondary | ICD-10-CM | POA: Diagnosis not present

## 2013-09-26 DIAGNOSIS — I5020 Unspecified systolic (congestive) heart failure: Secondary | ICD-10-CM | POA: Diagnosis not present

## 2013-09-26 DIAGNOSIS — I5040 Unspecified combined systolic (congestive) and diastolic (congestive) heart failure: Secondary | ICD-10-CM | POA: Diagnosis not present

## 2013-09-26 DIAGNOSIS — J320 Chronic maxillary sinusitis: Secondary | ICD-10-CM | POA: Diagnosis not present

## 2013-09-27 ENCOUNTER — Telehealth: Payer: Self-pay | Admitting: *Deleted

## 2013-09-27 MED ORDER — WARFARIN SODIUM 1 MG PO TABS: 1.0000 mg | ORAL_TABLET | Freq: Every day | ORAL | Status: DC

## 2013-09-27 MED ORDER — WARFARIN SODIUM 1 MG PO TABS: ORAL_TABLET | ORAL | Status: DC

## 2013-09-27 NOTE — Addendum Note (Signed)
Addended by: Crecencio Mc on: 09/27/2013 05:43 PM   Modules accepted: Orders

## 2013-09-27 NOTE — Assessment & Plan Note (Addendum)
Dose increased to 6 mg 3 days per week and 5 mg continued 4 days per week for INR  1.9.  Repeat INR 2 weeks

## 2013-09-27 NOTE — Telephone Encounter (Signed)
Message copied by Ronaldo Miyamoto on Fri Sep 27, 2013  5:45 PM ------      Message from: Crecencio Mc      Created: Fri Sep 27, 2013  5:35 PM       I would like her to take 6 mg daily of coumaidn on Saturdays, Mondays and Wednesdays,  5 mg all other dys.  Will send 1 mg tablet to pharmacy.   Repeat INR 2 weeks .            Dr.tullo      ----- Message -----         From: Cecelia Byars, RN         Sent: 09/24/2013   8:35 AM           To: Crecencio Mc, MD                   ------

## 2013-10-02 ENCOUNTER — Ambulatory Visit: Payer: Self-pay | Admitting: Internal Medicine

## 2013-10-02 DIAGNOSIS — I209 Angina pectoris, unspecified: Secondary | ICD-10-CM | POA: Diagnosis not present

## 2013-10-02 DIAGNOSIS — R0602 Shortness of breath: Secondary | ICD-10-CM | POA: Diagnosis not present

## 2013-10-07 ENCOUNTER — Other Ambulatory Visit: Payer: Self-pay | Admitting: *Deleted

## 2013-10-07 MED ORDER — WARFARIN SODIUM 5 MG PO TABS: 5.0000 mg | ORAL_TABLET | Freq: Every day | ORAL | Status: DC

## 2013-10-11 ENCOUNTER — Ambulatory Visit: Payer: Medicare Other | Admitting: Internal Medicine

## 2013-10-11 ENCOUNTER — Other Ambulatory Visit (INDEPENDENT_AMBULATORY_CARE_PROVIDER_SITE_OTHER): Payer: Medicare Other

## 2013-10-11 DIAGNOSIS — E1059 Type 1 diabetes mellitus with other circulatory complications: Secondary | ICD-10-CM

## 2013-10-11 DIAGNOSIS — E039 Hypothyroidism, unspecified: Secondary | ICD-10-CM | POA: Diagnosis not present

## 2013-10-11 DIAGNOSIS — Z7901 Long term (current) use of anticoagulants: Secondary | ICD-10-CM | POA: Diagnosis not present

## 2013-10-11 DIAGNOSIS — N183 Chronic kidney disease, stage 3 unspecified: Secondary | ICD-10-CM

## 2013-10-11 DIAGNOSIS — E559 Vitamin D deficiency, unspecified: Secondary | ICD-10-CM | POA: Diagnosis not present

## 2013-10-11 LAB — LIPID PANEL
CHOLESTEROL: 164 mg/dL (ref 0–200)
HDL: 69.7 mg/dL (ref >39.00)
LDL Cholesterol: 64 mg/dL (ref 0–99)
Total CHOL/HDL Ratio: 2
Triglycerides: 152 mg/dL — ABNORMAL HIGH (ref 0.0–149.0)
VLDL: 30.4 mg/dL (ref 0.0–40.0)

## 2013-10-11 LAB — PROTIME-INR
INR: 1.4 ratio — ABNORMAL HIGH (ref 0.8–1.0)
Prothrombin Time: 14.6 s — ABNORMAL HIGH (ref 10.2–12.4)

## 2013-10-11 LAB — COMPREHENSIVE METABOLIC PANEL
ALT: 14 U/L (ref 0–35)
AST: 19 U/L (ref 0–37)
Albumin: 3.6 g/dL (ref 3.5–5.2)
Alkaline Phosphatase: 40 U/L (ref 39–117)
BUN: 18 mg/dL (ref 6–23)
CALCIUM: 8.8 mg/dL (ref 8.4–10.5)
CHLORIDE: 108 meq/L (ref 96–112)
CO2: 30 meq/L (ref 19–32)
CREATININE: 1.2 mg/dL (ref 0.4–1.2)
GFR: 46.32 mL/min — ABNORMAL LOW (ref >60.00)
Glucose, Bld: 105 mg/dL — ABNORMAL HIGH (ref 70–99)
Potassium: 4.4 mEq/L (ref 3.5–5.1)
Sodium: 144 mEq/L (ref 135–145)
Total Bilirubin: 0.5 mg/dL (ref 0.3–1.2)
Total Protein: 6 g/dL (ref 6.0–8.3)

## 2013-10-11 LAB — HEMOGLOBIN A1C: Hgb A1c MFr Bld: 7 % — ABNORMAL HIGH (ref 4.6–6.5)

## 2013-10-11 LAB — MICROALBUMIN / CREATININE URINE RATIO
Creatinine,U: 202.7 mg/dL
MICROALB/CREAT RATIO: 0.4 mg/g (ref 0.0–30.0)
Microalb, Ur: 0.9 mg/dL (ref 0.0–1.9)

## 2013-10-11 LAB — TSH: TSH: 3.24 u[IU]/mL (ref 0.35–5.50)

## 2013-10-12 LAB — VITAMIN D 25 HYDROXY (VIT D DEFICIENCY, FRACTURES): VIT D 25 HYDROXY: 43 ng/mL (ref 30–89)

## 2013-10-13 NOTE — Addendum Note (Signed)
Addended by: Crecencio Mc on: 10/13/2013 09:48 AM   Modules accepted: Orders

## 2013-10-14 DIAGNOSIS — R0602 Shortness of breath: Secondary | ICD-10-CM | POA: Diagnosis not present

## 2013-10-14 DIAGNOSIS — I2789 Other specified pulmonary heart diseases: Secondary | ICD-10-CM | POA: Diagnosis not present

## 2013-10-16 ENCOUNTER — Encounter: Payer: Self-pay | Admitting: Internal Medicine

## 2013-10-16 ENCOUNTER — Ambulatory Visit (INDEPENDENT_AMBULATORY_CARE_PROVIDER_SITE_OTHER): Payer: Medicare Other | Admitting: Internal Medicine

## 2013-10-16 VITALS — BP 108/70 | HR 97 | Temp 97.8°F | Resp 12 | Wt 155.0 lb

## 2013-10-16 DIAGNOSIS — N289 Disorder of kidney and ureter, unspecified: Secondary | ICD-10-CM

## 2013-10-16 DIAGNOSIS — Z7901 Long term (current) use of anticoagulants: Secondary | ICD-10-CM

## 2013-10-16 DIAGNOSIS — Z23 Encounter for immunization: Secondary | ICD-10-CM

## 2013-10-16 DIAGNOSIS — E119 Type 2 diabetes mellitus without complications: Secondary | ICD-10-CM

## 2013-10-16 DIAGNOSIS — Z8701 Personal history of pneumonia (recurrent): Secondary | ICD-10-CM

## 2013-10-16 DIAGNOSIS — B0229 Other postherpetic nervous system involvement: Secondary | ICD-10-CM | POA: Diagnosis not present

## 2013-10-16 MED ORDER — ALPRAZOLAM 0.5 MG PO TABS: 0.5000 mg | ORAL_TABLET | Freq: Every evening | ORAL | Status: DC | PRN

## 2013-10-16 MED ORDER — WARFARIN SODIUM 2 MG PO TABS: 2.0000 mg | ORAL_TABLET | Freq: Every day | ORAL | Status: DC

## 2013-10-16 NOTE — Progress Notes (Signed)
Pre-visit discussion using our clinic review tool. No additional management support is needed unless otherwise documented below in the visit note.  

## 2013-10-16 NOTE — Assessment & Plan Note (Signed)
Last inr was 1.4 on 6/5 regimen.  Dose increased to 7 mg tonight,  6 mg daily going forward.  Repeat in 10 days

## 2013-10-16 NOTE — Patient Instructions (Addendum)
Take 7 mg tonight of coumadin,  Then 6 mg daily therafter,  Repeat INR 10 days along with BMET in 1o to 14 days   I want you to drink 24 ounces of water or other  noncaffeinated beverage daily   Congratulations on the weight loss   Return in 3 months for diabetes followup,  Labs due on or after the 24th of April   toufayan makes a low carb wrap that is better tasting and  9 carbs

## 2013-10-16 NOTE — Assessment & Plan Note (Signed)
Persistent involcing the right leg and foot.  Pin has improved ,  Still bother her once a month

## 2013-10-16 NOTE — Progress Notes (Signed)
Patient ID: Diana [name], female   DOB: 10-12-1934, 79 y.o.   MRN: [id number]  Patient Active Problem List   Diagnosis Date Noted  . Other and unspecified hyperlipidemia 08/21/2013  . H/O recurrent pneumonia 05/26/2013  . Long term (current) use of anticoagulants 03/27/2013  . DVT, recurrent, lower extremity, chronic 01/07/2013  . Hiatal hernia 01/06/2013  . Renal mass, left 01/06/2013  . Sciatica of right side 11/20/2012  . Post herpetic neuralgia 11/20/2012  . Disseminated zoster 10/31/2012  . Insomnia 11/08/2011  . LUPUS ERYTHEMATOSUS 06/13/2008  . NEUROPATHY-PERIPHERAL 10/06/2006  . Unspecified Hypothyroidism 09/25/2006  . ANEMIA, B12 DEFICIENCY 09/25/2006  . GERD 09/25/2006  . Systemic Lupus Erythematosus 09/25/2006  . Type II or unspecified type diabetes mellitus without mention of complication, not stated as uncontrolled 09/25/2006  . SHINGLES, HX OF 09/25/2006  . TOBACCO ABUSE, HX OF 09/25/2006    Subjective:  CC:   Chief Complaint  Patient presents with  . Follow-up    HPI:   Diana [name] a 79 y.o. female who presents for follow up on chronic conditions including SLE, DM Type 2, overweight and post herpetic neuralgia. She thinks her pulmonary hypertension has progressed because he has been feeling more short of breath .   She has been following up with Dr. Welton Flakes for pulmonary ,  And Rheumatology at [facility].    She was recently treated for a total of 21 days for pneumonia.  Started with  7 days with levaquin by Khan's PA . Her cough and dyspnea did not resolve so she saw ENT Vaught  Who treated her for  concurrent sinusitis with levaquin for another 14 days. "I was sicker this time than the other time"   Feels better now.  States that her pneumonias are never accompanied by fever or infiltrates on chest  X ray.      She has had an intentional wt loss of 10 lbs by eating less and restricting carbs,  But gained 5 back over the holidays   Past Medical History   Diagnosis Date  . Lupus (systemic lupus erythematosus)   . Pulmonary hypertension   . Pneumonia   . Unspecified menopausal and postmenopausal disorder   . Primary pulmonary hypertension   . Acute glomerulonephritis with other specified pathological lesion in kidney in disease classified elsewhere(580.81)   . Unspecified hypothyroidism   . Shingles (herpes zoster) polyneuropathy March 2013    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    History reviewed. No pertinent past surgical history.     The following portions of the patient's history were reviewed and updated as appropriate: Allergies, current medications, and problem list.    Review of Systems:   12 Pt  review of systems was negative except those addressed in the HPI,     History   Social History  . Marital Status: Single    Spouse Name: N/A    Number of Children: N/A  . Years of Education: N/A   Occupational History  . 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 Smoking status: Former Smoker    Quit date: 05/25/1991  . Smokeless tobacco: Never Used  . Alcohol Use: Yes     Comment: rare  . Drug Use: No  . Sexual Activity: Not on file   Other Topics Concern  . Not on file   Social History Narrative  . No narrative on file    Objective:  Filed Vitals:  10/16/13 1022  BP: 108/70  Pulse: 97  Temp: 97.8 F (36.6 C)  Resp: 12     General appearance: alert, cooperative and appears stated age Ears: normal TM's and external ear canals both ears Throat: lips, mucosa, and tongue normal; teeth and gums normal Neck: no adenopathy, no carotid bruit, supple, symmetrical, trachea midline and thyroid not enlarged, symmetric, no tenderness/mass/nodules Back: symmetric, no curvature. ROM normal. No CVA tenderness. Lungs: clear to auscultation bilaterally Heart: regular rate and rhythm, S1, S2 normal, no murmur, click, rub or gallop Abdomen: soft, non-tender; bowel sounds  normal; no masses,  no organomegaly Pulses: 2+ and symmetric Skin: Skin color, texture, turgor normal. No rashes or lesions Lymph nodes: Cervical, supraclavicular, and axillary nodes normal.  Assessment and Plan:  Long term (current) use of anticoagulants Last inr was 1.4 on 6/5 regimen.  Dose increased to 7 mg tonight,  6 mg daily going forward.  Repeat in 10 days   Post herpetic neuralgia Persistent involcing the right leg and foot.  Pin has improved ,  Still bother her once a month   H/O recurrent pneumonia I discussed my concern that she is has had an enormous amount of antibiotics for symptoms that may not have been due to pneumonia, increasing her risk of c dicile colitis.  She is adamant that she had pneumonia because she had persistent shortness of breath.  She does have a lage hiatal hernia compressing the right lower lobe. .  She will follow up with her pulmonologist in a week.   Type II or unspecified type diabetes mellitus without mention of complication, not stated as uncontrolled Lab Results  Component Value Date   HGBA1C 7.0* 10/11/2013   Managed with dietary restraints.  IImproved control on diet alone .  hemoglobin A1c   7.0 . Patient is up-to-date on eye exams and foot exam was done today.  There is  no proteinuria on today's micro urinalysis .  Fasting lipids have been reviewed and statin therapy has been advised and updated according to new ACC guidelines based on patient's 10 year risk of CAD      Updated Medication List Outpatient Encounter Prescriptions as of 10/16/2013  Medication Sig  . cyanocobalamin (,VITAMIN B-12,) 1000 MCG/ML injection Inject 1 mL (1,000 mcg total) into the muscle every 30 (thirty) days.  Marland Kitchen estrogen, conjugated,-medroxyprogesterone (PREMPRO) 0.45-1.5 MG per tablet Take 1 tablet by mouth daily.    . fluticasone (FLONASE) 50 MCG/ACT nasal spray 2 sprays in each nostril once daily  . HYDROcodone-acetaminophen (NORCO/VICODIN) 5-325 MG per tablet  Take 1 tablet by mouth every 4 (four) hours as needed.  . hydroxychloroquine (PLAQUENIL) 200 MG tablet TAKE 1 TABLET EVERY DAY  . ipratropium-albuterol (DUONEB) 0.5-2.5 (3) MG/3ML SOLN Take 3 mLs by nebulization every 6 (six) hours as needed.  . loratadine (CLARITIN) 10 MG tablet Take 10 mg by mouth as needed.  . metoprolol tartrate (LOPRESSOR) 25 MG tablet Take 25 mg by mouth 2 (two) times daily.  . mycophenolate (CELLCEPT) 500 MG tablet Take 500 mg by mouth 2 (two) times daily. Take two 500 mg tab in the morning and two  500 mg at bed time  . omeprazole (PRILOSEC) 20 MG capsule Take 40 mg by mouth daily.   . ondansetron (ZOFRAN) 4 MG tablet   . oxyCODONE-acetaminophen (ROXICET) 5-325 MG per tablet Take 1 tablet by mouth every 8 (eight) hours as needed for severe pain.  Marland Kitchen oxyCODONE-acetaminophen (ROXICET) 5-325 MG per tablet Take 1 tablet  by mouth every 4 (four) hours as needed.  Marland Kitchen PREDNISONE PO Take 7 mg by mouth daily.   . ranitidine (ZANTAC) 150 MG tablet Take 1 tablet (150 mg total) by mouth at bedtime.  Marland Kitchen SYNTHROID 150 MCG tablet Take 1 tablet (150 mcg total) by mouth daily. Name brand only  . Tadalafil, PAH, (ADCIRCA) 20 MG TABS Take 2 tablets by mouth daily.   . valACYclovir (VALTREX) 1000 MG tablet Take 1 tablet (1,000 mg total) by mouth 3 (three) times daily.  . vitamin D, CHOLECALCIFEROL, 400 UNITS tablet Take 400 Units by mouth daily.    Marland Kitchen warfarin (COUMADIN) 1 MG tablet Take as directed by the physician  . warfarin (COUMADIN) 5 MG tablet Take 1 tablet (5 mg total) by mouth daily. Take as directed  . [DISCONTINUED] warfarin (COUMADIN) 1 MG tablet Take 1 tablet (1 mg total) by mouth daily.  Marland Kitchen ALPRAZolam (XANAX) 0.5 MG tablet Take 1 tablet (0.5 mg total) by mouth at bedtime as needed for anxiety or sleep.  Marland Kitchen enoxaparin (LOVENOX) 80 MG/0.8ML injection Inject 0.75 mLs (75 mg total) into the skin every 12 (twelve) hours.  . gabapentin (NEURONTIN) 300 MG capsule Take 400 mg by mouth every  8 (eight) hours.   . Lactulose 20 GM/30ML SOLN Take 30 mLs (20 g total) by mouth every 6 (six) hours as needed (for relief of constipation).  . warfarin (COUMADIN) 2 MG tablet Take 1 tablet (2 mg total) by mouth daily.  . [DISCONTINUED] levofloxacin (LEVAQUIN) 500 MG tablet Take 1 tablet (500 mg total) by mouth daily.    A total of 40 minutes was spent with patient more than half of which was spent in counseling, reviewing records from other prviders and coordination of care.  Orders Placed This Encounter  Procedures  . Pneumococcal conjugate vaccine 13-valent  . Flu vaccine HIGH DOSE PF (Fluzone Tri High dose)  . Basic metabolic panel  . Protime-INR  . CBC with Differential    No Follow-up on file.

## 2013-10-17 ENCOUNTER — Encounter: Payer: Self-pay | Admitting: Internal Medicine

## 2013-10-17 NOTE — Assessment & Plan Note (Signed)
Lab Results  Component Value Date   HGBA1C 7.0* 10/11/2013   Managed with dietary restraints.  IImproved control on diet alone .  hemoglobin A1c   7.0 . Patient is up-to-date on eye exams and foot exam was done today.  There is  no proteinuria on today's micro urinalysis .  Fasting lipids have been reviewed and statin therapy has been advised and updated according to new ACC guidelines based on patient's 10 year risk of CAD

## 2013-10-17 NOTE — Assessment & Plan Note (Addendum)
I discussed my concern that she is has had an enormous amount of antibiotics for symptoms that may not have been due to pneumonia, increasing her risk of c dicile colitis.  She is adamant that she had pneumonia because she had persistent shortness of breath.  She does have a lage hiatal hernia compressing the right lower lobe. .  She will follow up with her pulmonologist in a week.

## 2013-10-23 DIAGNOSIS — Z13820 Encounter for screening for osteoporosis: Secondary | ICD-10-CM | POA: Diagnosis not present

## 2013-10-23 DIAGNOSIS — M810 Age-related osteoporosis without current pathological fracture: Secondary | ICD-10-CM | POA: Diagnosis not present

## 2013-10-23 DIAGNOSIS — M899 Disorder of bone, unspecified: Secondary | ICD-10-CM | POA: Diagnosis not present

## 2013-10-23 DIAGNOSIS — N951 Menopausal and female climacteric states: Secondary | ICD-10-CM | POA: Diagnosis not present

## 2013-10-23 DIAGNOSIS — M949 Disorder of cartilage, unspecified: Secondary | ICD-10-CM | POA: Diagnosis not present

## 2013-10-23 DIAGNOSIS — E2839 Other primary ovarian failure: Secondary | ICD-10-CM | POA: Diagnosis not present

## 2013-10-28 ENCOUNTER — Other Ambulatory Visit (INDEPENDENT_AMBULATORY_CARE_PROVIDER_SITE_OTHER): Payer: Medicare Other

## 2013-10-28 DIAGNOSIS — N183 Chronic kidney disease, stage 3 unspecified: Secondary | ICD-10-CM

## 2013-10-28 DIAGNOSIS — N289 Disorder of kidney and ureter, unspecified: Secondary | ICD-10-CM | POA: Diagnosis not present

## 2013-10-28 DIAGNOSIS — Z7901 Long term (current) use of anticoagulants: Secondary | ICD-10-CM | POA: Diagnosis not present

## 2013-10-28 LAB — CBC WITH DIFFERENTIAL/PLATELET
Basophils Absolute: 0 10*3/uL (ref 0.0–0.1)
Basophils Relative: 1.1 % (ref 0.0–3.0)
EOS ABS: 0.1 10*3/uL (ref 0.0–0.7)
EOS PCT: 1.5 % (ref 0.0–5.0)
HEMATOCRIT: 37.6 % (ref 36.0–46.0)
HEMOGLOBIN: 11.9 g/dL — AB (ref 12.0–15.0)
LYMPHS ABS: 1.2 10*3/uL (ref 0.7–4.0)
Lymphocytes Relative: 28.1 % (ref 12.0–46.0)
MCHC: 31.7 g/dL (ref 30.0–36.0)
MCV: 93.1 fl (ref 78.0–100.0)
MONO ABS: 0.4 10*3/uL (ref 0.1–1.0)
Monocytes Relative: 9.2 % (ref 3.0–12.0)
NEUTROS ABS: 2.6 10*3/uL (ref 1.4–7.7)
Neutrophils Relative %: 60.1 % (ref 43.0–77.0)
Platelets: 271 10*3/uL (ref 150.0–400.0)
RBC: 4.04 Mil/uL (ref 3.87–5.11)
RDW: 16.6 % — ABNORMAL HIGH (ref 11.5–14.6)
WBC: 4.4 10*3/uL — AB (ref 4.5–10.5)

## 2013-10-28 LAB — BASIC METABOLIC PANEL
BUN: 18 mg/dL (ref 6–23)
CHLORIDE: 106 meq/L (ref 96–112)
CO2: 26 meq/L (ref 19–32)
Calcium: 8.9 mg/dL (ref 8.4–10.5)
Creatinine, Ser: 1.2 mg/dL (ref 0.4–1.2)
GFR: 48.65 mL/min — AB (ref >60.00)
GLUCOSE: 121 mg/dL — AB (ref 70–99)
POTASSIUM: 3.9 meq/L (ref 3.5–5.1)
Sodium: 141 mEq/L (ref 135–145)

## 2013-10-28 LAB — PROTIME-INR
INR: 2.8 ratio — ABNORMAL HIGH (ref 0.8–1.0)
Prothrombin Time: 28.9 s — ABNORMAL HIGH (ref 10.2–12.4)

## 2013-10-29 LAB — PTH, INTACT AND CALCIUM
Calcium: 9.1 mg/dL (ref 8.4–10.5)
PTH: 81.8 pg/mL — ABNORMAL HIGH (ref 14.0–72.0)

## 2013-11-07 LAB — HM DEXA SCAN

## 2013-11-20 DIAGNOSIS — Z800 Family history of malignant neoplasm of digestive organs: Secondary | ICD-10-CM | POA: Diagnosis not present

## 2013-11-20 DIAGNOSIS — Z86010 Personal history of colonic polyps: Secondary | ICD-10-CM | POA: Diagnosis not present

## 2013-11-25 ENCOUNTER — Other Ambulatory Visit (INDEPENDENT_AMBULATORY_CARE_PROVIDER_SITE_OTHER): Payer: Medicare Other

## 2013-11-25 DIAGNOSIS — Z7901 Long term (current) use of anticoagulants: Secondary | ICD-10-CM | POA: Diagnosis not present

## 2013-11-25 DIAGNOSIS — R0602 Shortness of breath: Secondary | ICD-10-CM | POA: Diagnosis not present

## 2013-11-25 DIAGNOSIS — N183 Chronic kidney disease, stage 3 unspecified: Secondary | ICD-10-CM

## 2013-11-25 DIAGNOSIS — I279 Pulmonary heart disease, unspecified: Secondary | ICD-10-CM | POA: Diagnosis not present

## 2013-11-25 LAB — BASIC METABOLIC PANEL
BUN: 20 mg/dL (ref 6–23)
CALCIUM: 9.2 mg/dL (ref 8.4–10.5)
CO2: 28 meq/L (ref 19–32)
CREATININE: 1.3 mg/dL — AB (ref 0.4–1.2)
Chloride: 108 mEq/L (ref 96–112)
GFR: 41.85 mL/min — ABNORMAL LOW (ref >60.00)
GLUCOSE: 163 mg/dL — AB (ref 70–99)
Potassium: 4.2 mEq/L (ref 3.5–5.1)
Sodium: 143 mEq/L (ref 135–145)

## 2013-11-25 LAB — PROTIME-INR
INR: 1.5 ratio — ABNORMAL HIGH (ref 0.8–1.0)
Prothrombin Time: 16.2 s — ABNORMAL HIGH (ref 10.2–12.4)

## 2013-11-26 ENCOUNTER — Other Ambulatory Visit: Payer: Medicare Other

## 2013-11-26 ENCOUNTER — Ambulatory Visit: Payer: Self-pay | Admitting: Anesthesiology

## 2013-11-26 DIAGNOSIS — M545 Low back pain, unspecified: Secondary | ICD-10-CM | POA: Diagnosis not present

## 2013-11-26 DIAGNOSIS — M4800 Spinal stenosis, site unspecified: Secondary | ICD-10-CM | POA: Diagnosis not present

## 2013-12-03 ENCOUNTER — Other Ambulatory Visit (INDEPENDENT_AMBULATORY_CARE_PROVIDER_SITE_OTHER): Payer: Medicare Other

## 2013-12-03 DIAGNOSIS — Z7901 Long term (current) use of anticoagulants: Secondary | ICD-10-CM

## 2013-12-03 LAB — PROTIME-INR
INR: 4.9 ratio — ABNORMAL HIGH (ref 0.8–1.0)
PROTHROMBIN TIME: 49.9 s — AB (ref 10.2–12.4)

## 2013-12-11 ENCOUNTER — Other Ambulatory Visit (INDEPENDENT_AMBULATORY_CARE_PROVIDER_SITE_OTHER): Payer: Medicare Other

## 2013-12-11 DIAGNOSIS — Z7901 Long term (current) use of anticoagulants: Secondary | ICD-10-CM

## 2013-12-11 LAB — PROTIME-INR
INR: 2.8 ratio — AB (ref 0.8–1.0)
Prothrombin Time: 29.4 s — ABNORMAL HIGH (ref 10.2–12.4)

## 2013-12-12 DIAGNOSIS — N390 Urinary tract infection, site not specified: Secondary | ICD-10-CM | POA: Diagnosis not present

## 2013-12-12 DIAGNOSIS — Z803 Family history of malignant neoplasm of breast: Secondary | ICD-10-CM | POA: Diagnosis not present

## 2013-12-12 DIAGNOSIS — IMO0002 Reserved for concepts with insufficient information to code with codable children: Secondary | ICD-10-CM | POA: Diagnosis not present

## 2013-12-12 DIAGNOSIS — M519 Unspecified thoracic, thoracolumbar and lumbosacral intervertebral disc disorder: Secondary | ICD-10-CM | POA: Diagnosis not present

## 2013-12-12 DIAGNOSIS — Z880 Allergy status to penicillin: Secondary | ICD-10-CM | POA: Diagnosis not present

## 2013-12-12 DIAGNOSIS — M329 Systemic lupus erythematosus, unspecified: Secondary | ICD-10-CM | POA: Diagnosis not present

## 2013-12-12 DIAGNOSIS — Z8249 Family history of ischemic heart disease and other diseases of the circulatory system: Secondary | ICD-10-CM | POA: Diagnosis not present

## 2013-12-12 DIAGNOSIS — R0602 Shortness of breath: Secondary | ICD-10-CM | POA: Diagnosis not present

## 2013-12-12 DIAGNOSIS — N058 Unspecified nephritic syndrome with other morphologic changes: Secondary | ICD-10-CM | POA: Diagnosis not present

## 2013-12-12 DIAGNOSIS — J329 Chronic sinusitis, unspecified: Secondary | ICD-10-CM | POA: Diagnosis not present

## 2013-12-12 DIAGNOSIS — Z79899 Other long term (current) drug therapy: Secondary | ICD-10-CM | POA: Diagnosis not present

## 2013-12-17 ENCOUNTER — Other Ambulatory Visit (INDEPENDENT_AMBULATORY_CARE_PROVIDER_SITE_OTHER): Payer: Medicare Other

## 2013-12-17 DIAGNOSIS — Z7901 Long term (current) use of anticoagulants: Secondary | ICD-10-CM

## 2013-12-17 LAB — PROTIME-INR
INR: 3.5 ratio — ABNORMAL HIGH (ref 0.8–1.0)
Prothrombin Time: 36.6 s — ABNORMAL HIGH (ref 10.2–12.4)

## 2013-12-25 DIAGNOSIS — R0602 Shortness of breath: Secondary | ICD-10-CM | POA: Diagnosis not present

## 2014-01-02 ENCOUNTER — Encounter: Payer: Self-pay | Admitting: Adult Health

## 2014-01-02 ENCOUNTER — Ambulatory Visit (INDEPENDENT_AMBULATORY_CARE_PROVIDER_SITE_OTHER): Payer: Medicare Other | Admitting: Adult Health

## 2014-01-02 VITALS — BP 106/70 | HR 78 | Temp 98.1°F | Resp 14 | Wt 155.0 lb

## 2014-01-02 DIAGNOSIS — N3289 Other specified disorders of bladder: Secondary | ICD-10-CM | POA: Insufficient documentation

## 2014-01-02 DIAGNOSIS — R6889 Other general symptoms and signs: Secondary | ICD-10-CM

## 2014-01-02 DIAGNOSIS — Z7901 Long term (current) use of anticoagulants: Secondary | ICD-10-CM

## 2014-01-02 DIAGNOSIS — N390 Urinary tract infection, site not specified: Secondary | ICD-10-CM | POA: Diagnosis not present

## 2014-01-02 DIAGNOSIS — R899 Unspecified abnormal finding in specimens from other organs, systems and tissues: Secondary | ICD-10-CM | POA: Insufficient documentation

## 2014-01-02 DIAGNOSIS — R0689 Other abnormalities of breathing: Secondary | ICD-10-CM | POA: Insufficient documentation

## 2014-01-02 LAB — POCT URINALYSIS DIPSTICK
Bilirubin, UA: NEGATIVE
GLUCOSE UA: NEGATIVE
Ketones, UA: NEGATIVE
Leukocytes, UA: NEGATIVE
NITRITE UA: NEGATIVE
Protein, UA: NEGATIVE
SPEC GRAV UA: 1.015
UROBILINOGEN UA: 0.2
pH, UA: 5.5

## 2014-01-02 LAB — CBC WITH DIFFERENTIAL/PLATELET
BASOS PCT: 1 % (ref 0–1)
Basophils Absolute: 0 10*3/uL (ref 0.0–0.1)
EOS ABS: 0 10*3/uL (ref 0.0–0.7)
Eosinophils Relative: 1 % (ref 0–5)
HCT: 34.1 % — ABNORMAL LOW (ref 36.0–46.0)
Hemoglobin: 10.9 g/dL — ABNORMAL LOW (ref 12.0–15.0)
Lymphocytes Relative: 15 % (ref 12–46)
Lymphs Abs: 0.7 10*3/uL (ref 0.7–4.0)
MCH: 28.5 pg (ref 26.0–34.0)
MCHC: 32 g/dL (ref 30.0–36.0)
MCV: 89.3 fL (ref 78.0–100.0)
Monocytes Absolute: 0.2 10*3/uL (ref 0.1–1.0)
Monocytes Relative: 4 % (ref 3–12)
NEUTROS PCT: 79 % — AB (ref 43–77)
Neutro Abs: 3.5 10*3/uL (ref 1.7–7.7)
Platelets: 277 10*3/uL (ref 150–400)
RBC: 3.82 MIL/uL — ABNORMAL LOW (ref 3.87–5.11)
RDW: 14.8 % (ref 11.5–15.5)
WBC: 4.4 10*3/uL (ref 4.0–10.5)

## 2014-01-02 LAB — BASIC METABOLIC PANEL
BUN: 18 mg/dL (ref 6–23)
CALCIUM: 8.6 mg/dL (ref 8.4–10.5)
CO2: 27 mEq/L (ref 19–32)
Chloride: 107 mEq/L (ref 96–112)
Creat: 1.07 mg/dL (ref 0.50–1.10)
Glucose, Bld: 124 mg/dL — ABNORMAL HIGH (ref 70–99)
POTASSIUM: 5.2 meq/L (ref 3.5–5.3)
SODIUM: 143 meq/L (ref 135–145)

## 2014-01-02 MED ORDER — PHENAZOPYRIDINE HCL 200 MG PO TABS: 200.0000 mg | ORAL_TABLET | Freq: Three times a day (TID) | ORAL | Status: DC | PRN

## 2014-01-02 MED ORDER — CIPROFLOXACIN HCL 250 MG PO TABS: 250.0000 mg | ORAL_TABLET | Freq: Two times a day (BID) | ORAL | Status: DC

## 2014-01-02 NOTE — Progress Notes (Signed)
Subjective:    Patient ID: Diana [name], female    DOB: [birth year], 79 y.o.   MRN: [id number]  HPI  Patient is a pleasant 79 year old female who presents to clinic with discomfort over her suprapubic area and sensation of bladder spasms. She was seen approximately 2 weeks ago at [facility] by her nephrologist. At that time it was suspected that she may have a urinary tract infection and was started on Cipro. The culture did not grow any bacteria and patient was notified to stop taking the Cipro. Yesterday patient began to feel sick although her symptoms are very vague. She reports nausea yesterday which responded to Zofran. She is slightly nauseated today. She was concerned that the weekend was approaching and was afraid that she may get sicker. Patient does not have any fever; however, she is on chronic prednisone and fever usually suppressed. She has ongoing shortness of breath secondary to lupus. She is not in any distress.   Past Medical History  Diagnosis Date  . Lupus (systemic lupus erythematosus)   . Pulmonary hypertension   . Pneumonia   . Unspecified menopausal and postmenopausal disorder   . Primary pulmonary hypertension   . Acute glomerulonephritis with other specified pathological lesion in kidney in disease classified elsewhere(580.81)   . Unspecified hypothyroidism   . Shingles (herpes zoster) polyneuropathy March 2013    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     Review of Systems  Constitutional: Negative for fever and chills.       Not feeling well  Respiratory: Positive for shortness of breath (2/2 lupus).   Cardiovascular: Negative.   Genitourinary: Positive for urgency and frequency. Negative for dysuria, hematuria and difficulty urinating. Pelvic pain: suprapubic discomfort.       Bladder spasms  Neurological: Negative.   Psychiatric/Behavioral: Negative.   All other systems reviewed and are negative.      Objective:   Physical Exam  Constitutional: She is  oriented to person, place, and time. No distress.  Cardiovascular: Normal rate, regular rhythm and normal heart sounds.  Exam reveals no gallop.   No murmur heard. Pulmonary/Chest: Effort normal and breath sounds normal. No respiratory distress. She has no wheezes. She has no rales.  Genitourinary:  Suprapubic discomfort  Musculoskeletal: Normal range of motion.  Neurological: She is alert and oriented to person, place, and time.  Skin: Skin is warm and dry.  Psychiatric: She has a normal mood and affect. Her behavior is normal. Judgment and thought content normal.    BP 106/70  Pulse 78  Temp(Src) 98.1 F (36.7 C) (Oral)  Resp 14  Wt 155 lb (70.308 kg)  SpO2 96%      Assessment & Plan:   1. Bladder spasms Pyridium tid prn  Cipro bid x 10 days (she had 5 days remaining from Nephrologist). I ordered 5 additional days - POCT Urinalysis Dipstick - Urine culture  2. Abnormal laboratory test She had slightly abnormal results when she saw her Nephrologist at the end of March. I am checking labs to make sure there is no worsening of labs that may be contributing to her symptoms. - CBC with Differential - Basic metabolic panel  3. Long term (current) use of anticoagulants She is due for PT/INR tomorrow. We will go ahead and check this today. - Protime-INR

## 2014-01-02 NOTE — Progress Notes (Signed)
Pre visit review using our clinic review tool, if applicable. No additional management support is needed unless otherwise documented below in the visit note. 

## 2014-01-03 ENCOUNTER — Other Ambulatory Visit: Payer: Medicare Other

## 2014-01-03 LAB — PROTIME-INR
INR: 2.9 — AB (ref <1.50)
PROTHROMBIN TIME: 29.5 s — AB (ref 11.6–15.2)

## 2014-01-04 LAB — URINE CULTURE
COLONY COUNT: NO GROWTH
Organism ID, Bacteria: NO GROWTH

## 2014-01-08 ENCOUNTER — Ambulatory Visit: Payer: Self-pay | Admitting: Anesthesiology

## 2014-01-08 DIAGNOSIS — Z79899 Other long term (current) drug therapy: Secondary | ICD-10-CM | POA: Diagnosis not present

## 2014-01-08 DIAGNOSIS — M545 Low back pain, unspecified: Secondary | ICD-10-CM | POA: Diagnosis not present

## 2014-01-08 DIAGNOSIS — M48061 Spinal stenosis, lumbar region without neurogenic claudication: Secondary | ICD-10-CM | POA: Diagnosis not present

## 2014-01-09 ENCOUNTER — Telehealth: Payer: Self-pay | Admitting: *Deleted

## 2014-01-09 DIAGNOSIS — E119 Type 2 diabetes mellitus without complications: Secondary | ICD-10-CM

## 2014-01-09 DIAGNOSIS — D638 Anemia in other chronic diseases classified elsewhere: Secondary | ICD-10-CM

## 2014-01-09 DIAGNOSIS — Z7901 Long term (current) use of anticoagulants: Secondary | ICD-10-CM

## 2014-01-09 NOTE — Telephone Encounter (Signed)
Pt is coming in tomorrow what labs and dx?  

## 2014-01-10 ENCOUNTER — Other Ambulatory Visit (INDEPENDENT_AMBULATORY_CARE_PROVIDER_SITE_OTHER): Payer: Medicare Other

## 2014-01-10 ENCOUNTER — Telehealth: Payer: Self-pay | Admitting: Internal Medicine

## 2014-01-10 DIAGNOSIS — D638 Anemia in other chronic diseases classified elsewhere: Secondary | ICD-10-CM | POA: Diagnosis not present

## 2014-01-10 DIAGNOSIS — Z7901 Long term (current) use of anticoagulants: Secondary | ICD-10-CM

## 2014-01-10 DIAGNOSIS — E119 Type 2 diabetes mellitus without complications: Secondary | ICD-10-CM

## 2014-01-10 LAB — CBC WITH DIFFERENTIAL/PLATELET
BASOS PCT: 0.4 % (ref 0.0–3.0)
Basophils Absolute: 0 10*3/uL (ref 0.0–0.1)
EOS PCT: 0.1 % (ref 0.0–5.0)
Eosinophils Absolute: 0 10*3/uL (ref 0.0–0.7)
HEMATOCRIT: 34.8 % — AB (ref 36.0–46.0)
HEMOGLOBIN: 11.2 g/dL — AB (ref 12.0–15.0)
LYMPHS ABS: 0.8 10*3/uL (ref 0.7–4.0)
Lymphocytes Relative: 15 % (ref 12.0–46.0)
MCHC: 32 g/dL (ref 30.0–36.0)
MCV: 91.5 fl (ref 78.0–100.0)
MONO ABS: 0.4 10*3/uL (ref 0.1–1.0)
Monocytes Relative: 7.8 % (ref 3.0–12.0)
NEUTROS ABS: 4.2 10*3/uL (ref 1.4–7.7)
Neutrophils Relative %: 76.7 % (ref 43.0–77.0)
Platelets: 303 10*3/uL (ref 150.0–400.0)
RBC: 3.81 Mil/uL — ABNORMAL LOW (ref 3.87–5.11)
RDW: 15.2 % — ABNORMAL HIGH (ref 11.5–14.6)
WBC: 5.5 10*3/uL (ref 4.5–10.5)

## 2014-01-10 LAB — COMPREHENSIVE METABOLIC PANEL
ALK PHOS: 43 U/L (ref 39–117)
ALT: 13 U/L (ref 0–35)
AST: 21 U/L (ref 0–37)
Albumin: 3.9 g/dL (ref 3.5–5.2)
BILIRUBIN TOTAL: 0.4 mg/dL (ref 0.3–1.2)
BUN: 25 mg/dL — AB (ref 6–23)
CO2: 23 mEq/L (ref 19–32)
CREATININE: 1.1 mg/dL (ref 0.4–1.2)
Calcium: 9.5 mg/dL (ref 8.4–10.5)
Chloride: 106 mEq/L (ref 96–112)
GFR: 50.13 mL/min — AB (ref >60.00)
GLUCOSE: 136 mg/dL — AB (ref 70–99)
Potassium: 4.3 mEq/L (ref 3.5–5.1)
SODIUM: 138 meq/L (ref 135–145)
Total Protein: 6.3 g/dL (ref 6.0–8.3)

## 2014-01-10 LAB — HEMOGLOBIN A1C: HEMOGLOBIN A1C: 6.5 % (ref 4.6–6.5)

## 2014-01-10 LAB — PROTIME-INR
INR: 1.2 ratio — ABNORMAL HIGH (ref 0.8–1.0)
Prothrombin Time: 13.1 s (ref 9.6–13.1)

## 2014-01-10 NOTE — Telephone Encounter (Signed)
It is Unlikely to be from the cipro since it started a week later.  Her UTI should be resolved.  The rash could be from am viral infection.

## 2014-01-10 NOTE — Telephone Encounter (Signed)
Patient notified as instructed and advised patient to call on Monday to advise as to rash.

## 2014-01-10 NOTE — Telephone Encounter (Signed)
Please advise 

## 2014-01-10 NOTE — Telephone Encounter (Signed)
Patient Information:  Caller Name: Devinn  Phone: [phone]  Patient: Diana [name]  Gender: Female  DOB: [birth year]  Age: 79 Years  PCP: Deborra Medina (Adults only)  Office Follow Up:  Does the office need to follow up with this patient?: Yes  Instructions For The Office: Patient is concerned that this is allergic reaction to medication.  Please have physician review and advise.  Home care instructions and call back parameters reviewed.  RN Note:  Patient is concerned that this is allergic reaction to medication.  Please have physician review and advise.  Home care instructions and call back parameters reviewed.  Symptoms  Reason For Call & Symptoms: Patient states she has bladder infection and was started on Cipro one week ago. She awoke this morning with a fine rash over her cheeks., describes as tiny slightly raised and red.  No itching.  She stopped the Cipro and Pyridium this morning. Denies any swelling of face or tongue. Speech is clear.  Reviewed Health History In EMR: Yes  Reviewed Medications In EMR: Yes  Reviewed Allergies In EMR: Yes  Reviewed Surgeries / Procedures: Yes  Date of Onset of Symptoms: 01/10/2014  Guideline(s) Used:  Rash or Redness - Localized  Rash - Widespread on Drugs - Drug Reaction  Disposition Per Guideline:   Home Care  Reason For Disposition Reached:   Mild localized rash  Advice Given:  Apply Cold to the Area:  Apply or soak in cold water for 20 minutes every 3 to 4 hours to reduce itching or pain.  Avoid Soap:  Wash the area once thoroughly with soap to remove any remaining irritants. Thereafter avoid soaps to this area. Cleanse the area when needed with warm water.  Hydrocortisone Cream for Itching:   Keep the cream in the refrigerator (Reason: it feels better if applied cold).  Available over-the-counter in [place] as 0.5% and 1% cream.  Avoid Scratching:  Try not to scratch. Cut your fingernails short.  Call Back If:  Rash spreads or becomes worse  You become worse.  RN Overrode Recommendation:  Document Patient  Patient is concerned that this is allergic reaction to medication.  Please have physician review and advise.  Home care instructions and call back parameters reviewed.

## 2014-01-15 ENCOUNTER — Ambulatory Visit (INDEPENDENT_AMBULATORY_CARE_PROVIDER_SITE_OTHER): Payer: Medicare Other | Admitting: Internal Medicine

## 2014-01-15 ENCOUNTER — Encounter: Payer: Self-pay | Admitting: Internal Medicine

## 2014-01-15 VITALS — BP 106/70 | HR 102 | Temp 98.3°F | Resp 16 | Wt 151.2 lb

## 2014-01-15 DIAGNOSIS — Z01419 Encounter for gynecological examination (general) (routine) without abnormal findings: Secondary | ICD-10-CM | POA: Diagnosis not present

## 2014-01-15 DIAGNOSIS — M899 Disorder of bone, unspecified: Secondary | ICD-10-CM | POA: Diagnosis not present

## 2014-01-15 DIAGNOSIS — M949 Disorder of cartilage, unspecified: Secondary | ICD-10-CM | POA: Diagnosis not present

## 2014-01-15 DIAGNOSIS — E039 Hypothyroidism, unspecified: Secondary | ICD-10-CM | POA: Diagnosis not present

## 2014-01-15 DIAGNOSIS — E119 Type 2 diabetes mellitus without complications: Secondary | ICD-10-CM | POA: Diagnosis not present

## 2014-01-15 DIAGNOSIS — R32 Unspecified urinary incontinence: Secondary | ICD-10-CM | POA: Diagnosis not present

## 2014-01-15 DIAGNOSIS — H1013 Acute atopic conjunctivitis, bilateral: Secondary | ICD-10-CM

## 2014-01-15 DIAGNOSIS — I82509 Chronic embolism and thrombosis of unspecified deep veins of unspecified lower extremity: Secondary | ICD-10-CM

## 2014-01-15 DIAGNOSIS — N3289 Other specified disorders of bladder: Secondary | ICD-10-CM

## 2014-01-15 DIAGNOSIS — H1045 Other chronic allergic conjunctivitis: Secondary | ICD-10-CM

## 2014-01-15 DIAGNOSIS — Z803 Family history of malignant neoplasm of breast: Secondary | ICD-10-CM | POA: Diagnosis not present

## 2014-01-15 DIAGNOSIS — IMO0001 Reserved for inherently not codable concepts without codable children: Secondary | ICD-10-CM

## 2014-01-15 DIAGNOSIS — N951 Menopausal and female climacteric states: Secondary | ICD-10-CM | POA: Diagnosis not present

## 2014-01-15 DIAGNOSIS — M329 Systemic lupus erythematosus, unspecified: Secondary | ICD-10-CM

## 2014-01-15 MED ORDER — SYNTHROID 150 MCG PO TABS: 150.0000 ug | ORAL_TABLET | Freq: Every day | ORAL | Status: DC

## 2014-01-15 MED ORDER — OLOPATADINE HCL 0.1 % OP SOLN: 1.0000 [drp] | Freq: Two times a day (BID) | OPHTHALMIC | Status: DC

## 2014-01-15 NOTE — Progress Notes (Signed)
Pre-visit discussion using our clinic review tool. No additional management support is needed unless otherwise documented below in the visit note.  

## 2014-01-15 NOTE — Progress Notes (Signed)
Patient ID: Diana [name], female   DOB: March 27, 1935, 79 y.o.   MRN: [id number]   Patient Active Problem List   Diagnosis Date Noted  . Urinary incontinence 01/15/2014  . Bladder spasms 01/02/2014  . Abnormal laboratory test 01/02/2014  . Other and unspecified hyperlipidemia 08/21/2013  . H/O recurrent pneumonia 05/26/2013  . Long term (current) use of anticoagulants 03/27/2013  . DVT, recurrent, lower extremity, chronic 01/07/2013  . Hiatal hernia 01/06/2013  . Renal mass, left 01/06/2013  . Sciatica of right side 11/20/2012  . Post herpetic neuralgia 11/20/2012  . Disseminated zoster 10/31/2012  . Insomnia 11/08/2011  . LUPUS ERYTHEMATOSUS 06/13/2008  . NEUROPATHY-PERIPHERAL 10/06/2006  . Unspecified Hypothyroidism 09/25/2006  . ANEMIA, B12 DEFICIENCY 09/25/2006  . GERD 09/25/2006  . Systemic Lupus Erythematosus 09/25/2006  . Type II or unspecified type diabetes mellitus without mention of complication, not stated as uncontrolled 09/25/2006  . SHINGLES, HX OF 09/25/2006  . TOBACCO ABUSE, HX OF 09/25/2006    Subjective:  CC:   Chief Complaint  Patient presents with  . Follow-up    HPI:   Diana [name] is a 79 y.o. female who presents for Follow up on chronic conditions.   She reports increased dyspnea  For the past 2 week s with exertion accompaneid by a dry cough . Believes her lupus is acting up.  However her ALLERGIES ACTING UP,  EYES ITCHING and swollen in the morning, but without conjunctival  Discharge or periorbital redness. She is Using Systane  Having frequent bladder spasms..Last urine culture was negative but was treated for UTI with cipro x 1 week. Wonders if the diuretic could be causing it.    Neuropathy getting worse in legs  Still having shooting pains in both feet which she believe is from her previous prolonged episode of hingles.  Thinks she may have had a small outbreak again involving her right inner thigh   Past Medical History  Diagnosis  Date  . Lupus (systemic lupus erythematosus)   . Pulmonary hypertension   . Pneumonia   . Unspecified menopausal and postmenopausal disorder   . Primary pulmonary hypertension   . Acute glomerulonephritis with other specified pathological lesion in kidney in disease classified elsewhere(580.81)   . Unspecified hypothyroidism   . Shingles (herpes zoster) polyneuropathy March 2013    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    History reviewed. No pertinent past surgical history.     The following portions of the patient's history were reviewed and updated as appropriate: Allergies, current medications, and problem list.    Review of Systems:   Patient denies headache, fevers, malaise, unintentional weight loss, skin rash, eye pain, sinus congestion and sinus pain, sore throat, dysphagia,  hemoptysis , cough, dyspnea, wheezing, chest pain, palpitations, orthopnea, edema, abdominal pain, nausea, melena, diarrhea, constipation, flank pain, dysuria, hematuria, urinary  Frequency, nocturia, numbness, tingling, seizures,  Focal weakness, Loss of consciousness,  Tremor, insomnia, depression, anxiety, and suicidal ideation.     History   Social History  . Marital Status: Single    Spouse Name: N/A    Number of Children: N/A  . Years of Education: N/A   Occupational History  . 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 Smoking status: Former Smoker    Quit date: 05/25/1991  . Smokeless tobacco: Never Used  . Alcohol Use: Yes     Comment: rare  . Drug Use: No  . Sexual Activity: Not  on file   Other Topics Concern  . Not on file   Social History Narrative  . No narrative on file    Objective:  Filed Vitals:   01/15/14 1106  BP: 106/70  Pulse: 102  Temp: 98.3 F (36.8 C)  Resp: 16     General appearance: alert, cooperative and appears stated age Ears: normal TM's and external ear canals both ears Throat: lips, mucosa, and tongue normal;  teeth and gums normal Neck: no adenopathy, no carotid bruit, supple, symmetrical, trachea midline and thyroid not enlarged, symmetric, no tenderness/mass/nodules Back: symmetric, no curvature. ROM normal. No CVA tenderness. Lungs: clear to auscultation bilaterally Heart: regular rate and rhythm, S1, S2 normal, no murmur, click, rub or gallop Abdomen: soft, non-tender; bowel sounds normal; no masses,  no organomegaly Pulses: 2+ and symmetric Skin: Skin color, texture, turgor normal. No rashes or lesions Lymph nodes: Cervical, supraclavicular, and axillary nodes normal.  Assessment and Plan:  Type II or unspecified type diabetes mellitus without mention of complication, not stated as uncontrolled Lab Results  Component Value Date   HGBA1C 6.5 01/10/2014   Managed with dietary restraints.  IImproved control on diet alone .Marland Kitchen Patient is up-to-date on eye exams and foot exam was done today.  There is  no proteinuria on today's micro urinalysis .  Fasting lipids have been reviewed and statin therapy has been advised and updated according to new ACC guidelines based on patient's 10 year risk of CAD   Lab Results  Component Value Date   HGBA1C 6.5 01/10/2014   Lab Results  Component Value Date   MICROALBUR 0.9 10/11/2013       Unspecified Hypothyroidism Thyroid function is WNL on current dose.  No current changes needed.   Lab Results  Component Value Date   TSH 3.24 10/11/2013     DVT, recurrent, lower extremity, chronic Last inr was low to due to temporary cessation of warfarin for procedure.  She has resumed 6 mg daily and will recheck INR in 2 weeks Lab Results  Component Value Date   INR 1.2* 01/10/2014   INR 2.90* 01/02/2014   INR 3.5* 12/17/2013     Systemic Lupus Erythematosus She has had nephrology follow up in the recent months with no evidence of lupus lare but feels tht her flares are never actually detected because they are between doctor visits.  I have evaluated her  during prior symptomatic episodes with no objective data (normal ESR/CRP.  Her current symptoms of dyspnea are accompanied by a normal lung exam and normal ambulatory room air saturations. )  Bladder spasms Repeat UA and culture last week was normal.  Will avoid treating with abx in the future unless culture is positive   Updated Medication List Outpatient Encounter Prescriptions as of 01/15/2014  Medication Sig  . ALPRAZolam (XANAX) 0.5 MG tablet Take 1 tablet (0.5 mg total) by mouth at bedtime as needed for anxiety or sleep.  . cyanocobalamin (,VITAMIN B-12,) 1000 MCG/ML injection Inject 1 mL (1,000 mcg total) into the muscle every 30 (thirty) days.  Marland Kitchen enoxaparin (LOVENOX) 80 MG/0.8ML injection Inject 0.75 mLs (75 mg total) into the skin every 12 (twelve) hours.  Marland Kitchen estrogen, conjugated,-medroxyprogesterone (PREMPRO) 0.45-1.5 MG per tablet Take 1 tablet by mouth daily.    . fluticasone (FLONASE) 50 MCG/ACT nasal spray 2 sprays in each nostril once daily  . hydroxychloroquine (PLAQUENIL) 200 MG tablet TAKE 1 TABLET EVERY DAY  . loratadine (CLARITIN) 10 MG tablet Take 10 mg by  mouth as needed.  . metoprolol tartrate (LOPRESSOR) 25 MG tablet Take 25 mg by mouth daily.   . mycophenolate (CELLCEPT) 500 MG tablet Take 500 mg by mouth 2 (two) times daily. Take two 500 mg tab in the morning and two  500 mg at bed time  . omeprazole (PRILOSEC) 20 MG capsule Take 40 mg by mouth daily.   . ondansetron (ZOFRAN) 4 MG tablet   . phenazopyridine (PYRIDIUM) 200 MG tablet Take 1 tablet (200 mg total) by mouth 3 (three) times daily as needed for pain.  Marland Kitchen PREDNISONE PO Take 7 mg by mouth daily.   . ranitidine (ZANTAC) 150 MG tablet Take 1 tablet (150 mg total) by mouth at bedtime.  Marland Kitchen SYNTHROID 150 MCG tablet Take 1 tablet (150 mcg total) by mouth daily. Name brand only  . Tadalafil, PAH, (ADCIRCA) 20 MG TABS Take 2 tablets by mouth daily.   . vitamin D, CHOLECALCIFEROL, 400 UNITS tablet Take 400 Units by mouth  daily.    Marland Kitchen warfarin (COUMADIN) 1 MG tablet Take as directed by the physician  . warfarin (COUMADIN) 5 MG tablet Take 1 tablet (5 mg total) by mouth daily. Take as directed  . [DISCONTINUED] SYNTHROID 150 MCG tablet Take 1 tablet (150 mcg total) by mouth daily. Name brand only  . ciprofloxacin (CIPRO) 250 MG tablet Take 1 tablet (250 mg total) by mouth 2 (two) times daily.  Marland Kitchen olopatadine (PATANOL) 0.1 % ophthalmic solution Place 1 drop into both eyes 2 (two) times daily.     No orders of the defined types were placed in this encounter.    No Follow-up on file.

## 2014-01-17 ENCOUNTER — Encounter: Payer: Self-pay | Admitting: Internal Medicine

## 2014-01-17 NOTE — Assessment & Plan Note (Addendum)
Repeat UA and culture last week was normal.  Will avoid treating with abx in the future unless culture is positive

## 2014-01-17 NOTE — Assessment & Plan Note (Signed)
Lab Results  Component Value Date   HGBA1C 6.5 01/10/2014   Managed with dietary restraints.  IImproved control on diet alone .Marland Kitchen Patient is up-to-date on eye exams and foot exam was done today.  There is  no proteinuria on today's micro urinalysis .  Fasting lipids have been reviewed and statin therapy has been advised and updated according to new ACC guidelines based on patient's 10 year risk of CAD   Lab Results  Component Value Date   HGBA1C 6.5 01/10/2014   Lab Results  Component Value Date   MICROALBUR 0.9 10/11/2013

## 2014-01-17 NOTE — Assessment & Plan Note (Signed)
Last inr was low to due to temporary cessation of warfarin for procedure.  She has resumed 6 mg daily and will recheck INR in 2 weeks Lab Results  Component Value Date   INR 1.2* 01/10/2014   INR 2.90* 01/02/2014   INR 3.5* 12/17/2013

## 2014-01-17 NOTE — Assessment & Plan Note (Signed)
She has had nephrology follow up in the recent months with no evidence of lupus lare but feels tht her flares are never actually detected because they are between doctor visits.  I have evaluated her during prior symptomatic episodes with no objective data (normal ESR/CRP.  Her current symptoms of dyspnea are accompanied by a normal lung exam and normal ambulatory room air saturations. )

## 2014-01-17 NOTE — Assessment & Plan Note (Signed)
Thyroid function is WNL on current dose.  No current changes needed.   Lab Results  Component Value Date   TSH 3.24 10/11/2013

## 2014-01-20 ENCOUNTER — Telehealth: Payer: Self-pay | Admitting: *Deleted

## 2014-01-20 NOTE — Telephone Encounter (Signed)
i do not have any stronger eye drops than patanol unless we add steroids which is not advised unless done so by an opthalmologist.   Have her try otc allegra 180 mf daily instead of claritin and continue flonase

## 2014-01-20 NOTE — Telephone Encounter (Signed)
Confirmed Patanol drops since last week. One drop twice daily.

## 2014-01-20 NOTE — Telephone Encounter (Signed)
Notified patient and voiced understanding.

## 2014-01-20 NOTE — Telephone Encounter (Signed)
Confirm that she has been using the patanol eye drips since last week.

## 2014-01-20 NOTE — Telephone Encounter (Signed)
Patient called and stated she believes she needs the stronger eye drops you had suggested at last OV. Eyes are still bothering patient Please advise .Itching . Watery and crusty at times.

## 2014-01-21 DIAGNOSIS — I5030 Unspecified diastolic (congestive) heart failure: Secondary | ICD-10-CM | POA: Diagnosis not present

## 2014-01-21 DIAGNOSIS — M329 Systemic lupus erythematosus, unspecified: Secondary | ICD-10-CM | POA: Diagnosis not present

## 2014-01-21 DIAGNOSIS — I270 Primary pulmonary hypertension: Secondary | ICD-10-CM | POA: Diagnosis not present

## 2014-01-21 DIAGNOSIS — I82409 Acute embolism and thrombosis of unspecified deep veins of unspecified lower extremity: Secondary | ICD-10-CM | POA: Diagnosis not present

## 2014-01-21 DIAGNOSIS — I4891 Unspecified atrial fibrillation: Secondary | ICD-10-CM | POA: Diagnosis not present

## 2014-01-22 DIAGNOSIS — L821 Other seborrheic keratosis: Secondary | ICD-10-CM | POA: Diagnosis not present

## 2014-01-22 DIAGNOSIS — D0460 Carcinoma in situ of skin of unspecified upper limb, including shoulder: Secondary | ICD-10-CM | POA: Diagnosis not present

## 2014-01-22 DIAGNOSIS — D485 Neoplasm of uncertain behavior of skin: Secondary | ICD-10-CM | POA: Diagnosis not present

## 2014-01-22 DIAGNOSIS — H1045 Other chronic allergic conjunctivitis: Secondary | ICD-10-CM | POA: Diagnosis not present

## 2014-01-22 DIAGNOSIS — Z85828 Personal history of other malignant neoplasm of skin: Secondary | ICD-10-CM | POA: Diagnosis not present

## 2014-01-23 DIAGNOSIS — J309 Allergic rhinitis, unspecified: Secondary | ICD-10-CM | POA: Diagnosis not present

## 2014-01-23 DIAGNOSIS — J019 Acute sinusitis, unspecified: Secondary | ICD-10-CM | POA: Diagnosis not present

## 2014-02-18 DIAGNOSIS — C44621 Squamous cell carcinoma of skin of unspecified upper limb, including shoulder: Secondary | ICD-10-CM | POA: Insufficient documentation

## 2014-02-18 DIAGNOSIS — L578 Other skin changes due to chronic exposure to nonionizing radiation: Secondary | ICD-10-CM | POA: Diagnosis not present

## 2014-02-18 DIAGNOSIS — L908 Other atrophic disorders of skin: Secondary | ICD-10-CM | POA: Diagnosis not present

## 2014-02-18 DIAGNOSIS — L918 Other hypertrophic disorders of the skin: Secondary | ICD-10-CM | POA: Diagnosis not present

## 2014-02-18 DIAGNOSIS — L819 Disorder of pigmentation, unspecified: Secondary | ICD-10-CM | POA: Diagnosis not present

## 2014-02-21 ENCOUNTER — Other Ambulatory Visit: Payer: Medicare Other

## 2014-02-24 DIAGNOSIS — R0602 Shortness of breath: Secondary | ICD-10-CM | POA: Diagnosis not present

## 2014-02-24 DIAGNOSIS — J309 Allergic rhinitis, unspecified: Secondary | ICD-10-CM | POA: Diagnosis not present

## 2014-03-03 ENCOUNTER — Ambulatory Visit: Payer: Self-pay | Admitting: Anesthesiology

## 2014-03-03 DIAGNOSIS — M4800 Spinal stenosis, site unspecified: Secondary | ICD-10-CM | POA: Diagnosis not present

## 2014-03-03 DIAGNOSIS — B029 Zoster without complications: Secondary | ICD-10-CM | POA: Diagnosis not present

## 2014-03-03 DIAGNOSIS — M48061 Spinal stenosis, lumbar region without neurogenic claudication: Secondary | ICD-10-CM | POA: Diagnosis not present

## 2014-03-06 ENCOUNTER — Other Ambulatory Visit (INDEPENDENT_AMBULATORY_CARE_PROVIDER_SITE_OTHER): Payer: Medicare Other

## 2014-03-06 DIAGNOSIS — Z7901 Long term (current) use of anticoagulants: Secondary | ICD-10-CM | POA: Diagnosis not present

## 2014-03-06 LAB — PROTIME-INR
INR: 1.1 ratio — AB (ref 0.8–1.0)
Prothrombin Time: 12.2 s (ref 9.6–13.1)

## 2014-03-10 DIAGNOSIS — M810 Age-related osteoporosis without current pathological fracture: Secondary | ICD-10-CM | POA: Diagnosis not present

## 2014-03-10 DIAGNOSIS — Z79899 Other long term (current) drug therapy: Secondary | ICD-10-CM | POA: Diagnosis not present

## 2014-03-10 DIAGNOSIS — M329 Systemic lupus erythematosus, unspecified: Secondary | ICD-10-CM | POA: Diagnosis not present

## 2014-03-10 DIAGNOSIS — Z7901 Long term (current) use of anticoagulants: Secondary | ICD-10-CM | POA: Diagnosis not present

## 2014-03-10 DIAGNOSIS — M519 Unspecified thoracic, thoracolumbar and lumbosacral intervertebral disc disorder: Secondary | ICD-10-CM | POA: Diagnosis not present

## 2014-03-10 DIAGNOSIS — Z7982 Long term (current) use of aspirin: Secondary | ICD-10-CM | POA: Diagnosis not present

## 2014-03-10 DIAGNOSIS — IMO0002 Reserved for concepts with insufficient information to code with codable children: Secondary | ICD-10-CM | POA: Diagnosis not present

## 2014-03-19 ENCOUNTER — Other Ambulatory Visit (INDEPENDENT_AMBULATORY_CARE_PROVIDER_SITE_OTHER): Payer: Medicare Other

## 2014-03-19 DIAGNOSIS — Z7901 Long term (current) use of anticoagulants: Secondary | ICD-10-CM | POA: Diagnosis not present

## 2014-03-19 LAB — PROTIME-INR
INR: 3.2 ratio — ABNORMAL HIGH (ref 0.8–1.0)
Prothrombin Time: 34.3 s — ABNORMAL HIGH (ref 9.6–13.1)

## 2014-03-19 NOTE — Progress Notes (Signed)
Spoke with pt advised of MDs result note, scheduled lab appoint.

## 2014-03-19 NOTE — Assessment & Plan Note (Addendum)
INR was 3.2 on 6 mg daily.  Will Reduce dose to 5 mg on Wednesdays and Sundays,  6 mg all other days .  Repeat INR in 2 weeks

## 2014-03-20 DIAGNOSIS — R0602 Shortness of breath: Secondary | ICD-10-CM | POA: Diagnosis not present

## 2014-03-20 DIAGNOSIS — J329 Chronic sinusitis, unspecified: Secondary | ICD-10-CM | POA: Diagnosis not present

## 2014-03-20 DIAGNOSIS — M329 Systemic lupus erythematosus, unspecified: Secondary | ICD-10-CM | POA: Diagnosis not present

## 2014-03-20 DIAGNOSIS — N058 Unspecified nephritic syndrome with other morphologic changes: Secondary | ICD-10-CM | POA: Diagnosis not present

## 2014-03-20 DIAGNOSIS — Z803 Family history of malignant neoplasm of breast: Secondary | ICD-10-CM | POA: Diagnosis not present

## 2014-03-20 DIAGNOSIS — T7840XA Allergy, unspecified, initial encounter: Secondary | ICD-10-CM | POA: Diagnosis not present

## 2014-03-20 DIAGNOSIS — Z79899 Other long term (current) drug therapy: Secondary | ICD-10-CM | POA: Diagnosis not present

## 2014-03-20 DIAGNOSIS — Z8249 Family history of ischemic heart disease and other diseases of the circulatory system: Secondary | ICD-10-CM | POA: Diagnosis not present

## 2014-03-20 DIAGNOSIS — R3129 Other microscopic hematuria: Secondary | ICD-10-CM | POA: Diagnosis not present

## 2014-03-20 DIAGNOSIS — N390 Urinary tract infection, site not specified: Secondary | ICD-10-CM | POA: Diagnosis not present

## 2014-03-20 DIAGNOSIS — M519 Unspecified thoracic, thoracolumbar and lumbosacral intervertebral disc disorder: Secondary | ICD-10-CM | POA: Diagnosis not present

## 2014-03-27 DIAGNOSIS — H01009 Unspecified blepharitis unspecified eye, unspecified eyelid: Secondary | ICD-10-CM | POA: Diagnosis not present

## 2014-04-02 ENCOUNTER — Other Ambulatory Visit (INDEPENDENT_AMBULATORY_CARE_PROVIDER_SITE_OTHER): Payer: Medicare Other

## 2014-04-02 DIAGNOSIS — Z7901 Long term (current) use of anticoagulants: Secondary | ICD-10-CM | POA: Diagnosis not present

## 2014-04-02 LAB — PROTIME-INR
INR: 2.4 ratio — AB (ref 0.8–1.0)
Prothrombin Time: 26.3 s — ABNORMAL HIGH (ref 9.6–13.1)

## 2014-04-07 ENCOUNTER — Encounter: Payer: Self-pay | Admitting: Internal Medicine

## 2014-04-07 ENCOUNTER — Ambulatory Visit (INDEPENDENT_AMBULATORY_CARE_PROVIDER_SITE_OTHER): Payer: Medicare Other | Admitting: Internal Medicine

## 2014-04-07 VITALS — BP 132/76 | HR 81 | Temp 98.2°F | Resp 16 | Ht 64.0 in | Wt 156.5 lb

## 2014-04-07 DIAGNOSIS — S81809A Unspecified open wound, unspecified lower leg, initial encounter: Secondary | ICD-10-CM

## 2014-04-07 DIAGNOSIS — S91009A Unspecified open wound, unspecified ankle, initial encounter: Secondary | ICD-10-CM

## 2014-04-07 DIAGNOSIS — S81009A Unspecified open wound, unspecified knee, initial encounter: Secondary | ICD-10-CM | POA: Insufficient documentation

## 2014-04-07 DIAGNOSIS — S81801A Unspecified open wound, right lower leg, initial encounter: Secondary | ICD-10-CM | POA: Insufficient documentation

## 2014-04-07 DIAGNOSIS — L02419 Cutaneous abscess of limb, unspecified: Secondary | ICD-10-CM

## 2014-04-07 DIAGNOSIS — Z7901 Long term (current) use of anticoagulants: Secondary | ICD-10-CM

## 2014-04-07 DIAGNOSIS — L03119 Cellulitis of unspecified part of limb: Secondary | ICD-10-CM

## 2014-04-07 MED ORDER — CEPHALEXIN 500 MG PO CAPS: 500.0000 mg | ORAL_CAPSULE | Freq: Four times a day (QID) | ORAL | Status: DC

## 2014-04-07 MED ORDER — HYDROCODONE-ACETAMINOPHEN 5-325 MG PO TABS: 1.0000 | ORAL_TABLET | Freq: Four times a day (QID) | ORAL | Status: DC | PRN

## 2014-04-07 NOTE — Progress Notes (Signed)
Patient ID: Diana [name], female   DOB: 18-Apr-1935, 79 y.o.   MRN: [id number]   Patient Active Problem List   Diagnosis Date Noted  . Cellulitis and abscess of leg 04/08/2014  . Avulsion of skin of right lower leg 04/07/2014  . Urinary incontinence 01/15/2014  . Bladder spasms 01/02/2014  . Abnormal laboratory test 01/02/2014  . Other and unspecified hyperlipidemia 08/21/2013  . H/O recurrent pneumonia 05/26/2013  . Long term (current) use of anticoagulants 03/27/2013  . DVT, recurrent, lower extremity, chronic 01/07/2013  . Hiatal hernia 01/06/2013  . Renal mass, left 01/06/2013  . Sciatica of right side 11/20/2012  . Post herpetic neuralgia 11/20/2012  . Disseminated zoster 10/31/2012  . Insomnia 11/08/2011  . LUPUS ERYTHEMATOSUS 06/13/2008  . NEUROPATHY-PERIPHERAL 10/06/2006  . Unspecified Hypothyroidism 09/25/2006  . ANEMIA, B12 DEFICIENCY 09/25/2006  . GERD 09/25/2006  . Systemic Lupus Erythematosus 09/25/2006  . Type II or unspecified type diabetes mellitus without mention of complication, not stated as uncontrolled 09/25/2006  . SHINGLES, HX OF 09/25/2006  . TOBACCO ABUSE, HX OF 09/25/2006    Subjective:  CC:   Chief Complaint  Patient presents with  . Acute Visit    laceration to right leg. patient had some amoxicillin     HPI:   Diana [name] is a 79 y.o. female who presents for   Past Medical History  Diagnosis Date  . Lupus (systemic lupus erythematosus)   . Pulmonary hypertension   . Pneumonia   . Unspecified menopausal and postmenopausal disorder   . Primary pulmonary hypertension   . Acute glomerulonephritis with other specified pathological lesion in kidney in disease classified elsewhere(580.81)   . Unspecified hypothyroidism   . Shingles (herpes zoster) polyneuropathy March 2013    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    No past surgical history on file.     The following portions of the patient's history were reviewed and updated as  appropriate: Allergies, current medications, and problem list.    Review of Systems:   Patient denies headache, fevers, malaise, unintentional weight loss, skin rash, eye pain, sinus congestion and sinus pain, sore throat, dysphagia,  hemoptysis , cough, dyspnea, wheezing, chest pain, palpitations, orthopnea, edema, abdominal pain, nausea, melena, diarrhea, constipation, flank pain, dysuria, hematuria, urinary  Frequency, nocturia, numbness, tingling, seizures,  Focal weakness, Loss of consciousness,  Tremor, insomnia, depression, anxiety, and suicidal ideation.     History   Social History  . Marital Status: Single    Spouse Name: N/A    Number of Children: N/A  . Years of Education: N/A   Occupational History  . 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 Smoking status: Former Smoker    Quit date: 05/25/1991  . Smokeless tobacco: Never Used  . Alcohol Use: Yes     Comment: rare  . Drug Use: No  . Sexual Activity: Not on file   Other Topics Concern  . Not on file   Social History Narrative  . No narrative on file    Objective:  Filed Vitals:   04/07/14 1102  BP: 132/76  Pulse: 81  Temp: 98.2 F (36.8 C)  Resp: 16     General appearance: alert, cooperative and appears stated age Ears: normal TM's and external ear canals both ears Throat: lips, mucosa, and tongue normal; teeth and gums normal Neck: no adenopathy, no carotid bruit, supple, symmetrical, trachea midline and thyroid not enlarged, symmetric, no tenderness/mass/nodules  Back: symmetric, no curvature. ROM normal. No CVA tenderness. Lungs: clear to auscultation bilaterally Heart: regular rate and rhythm, S1, S2 normal, no murmur, click, rub or gallop Abdomen: soft, non-tender; bowel sounds normal; no masses,  no organomegaly Pulses: 2+ and symmetric Skin: 4 cm wound with granulating bed,  Surrounding erythema noted.  adherent nonviable skin notedions Lymph nodes:  Cervical, supraclavicular, and axillary nodes normal.  Assessment and Plan:  Long term (current) use of anticoagulants Current dose is 6 mg alt with 5 mg .  Changing to 5 mg alternating with  2 mg while on keflex  Repeat inR next week   Cellulitis and abscess of leg Early empiric teratment given patient's iatrogenic I/C state .  cephalexin called in,  She has tolerated it in the past as well as augmentin despite her allergy list.   Avulsion of skin of right lower leg Full thickness debridement of avulsed skin done was offered and advised.  After obtaining informed consent, wound was cleaned with sterile saline followed by betadine.  Scalpel and forceps were used to excise avulsed skin that had become adherent to wound bed.  Procedure was tolerated well and minimal bleeding noted.  Topical bacitracin was applied and wound was dressed  With telfa and leg wrapped in kerlex. Care instructions given.   Updated Medication List Outpatient Encounter Prescriptions as of 04/07/2014  Medication Sig  . ALPRAZolam (XANAX) 0.5 MG tablet Take 1 tablet (0.5 mg total) by mouth at bedtime as needed for anxiety or sleep.  . cyanocobalamin (,VITAMIN B-12,) 1000 MCG/ML injection Inject 1 mL (1,000 mcg total) into the muscle every 30 (thirty) days.  Marland Kitchen estrogen, conjugated,-medroxyprogesterone (PREMPRO) 0.45-1.5 MG per tablet Take 1 tablet by mouth daily.    . fluticasone (FLONASE) 50 MCG/ACT nasal spray 2 sprays in each nostril once daily  . hydroxychloroquine (PLAQUENIL) 200 MG tablet TAKE 1 TABLET EVERY DAY  . metoprolol tartrate (LOPRESSOR) 25 MG tablet Take 25 mg by mouth daily.   . mycophenolate (CELLCEPT) 500 MG tablet Take 500 mg by mouth 2 (two) times daily. Take two 500 mg tab in the morning and two  500 mg at bed time  . omeprazole (PRILOSEC) 20 MG capsule Take 40 mg by mouth daily.   Marland Kitchen PREDNISONE PO Take 7 mg by mouth daily.   . ranitidine (ZANTAC) 150 MG tablet Take 1 tablet (150 mg total) by mouth at  bedtime.  Marland Kitchen SYNTHROID 150 MCG tablet Take 1 tablet (150 mcg total) by mouth daily. Name brand only  . Tadalafil, PAH, (ADCIRCA) 20 MG TABS Take 2 tablets by mouth daily.   . vitamin D, CHOLECALCIFEROL, 400 UNITS tablet Take 400 Units by mouth daily.    Marland Kitchen warfarin (COUMADIN) 1 MG tablet Take as directed by the physician  . warfarin (COUMADIN) 5 MG tablet Take 1 tablet (5 mg total) by mouth daily. Take as directed  . cephALEXin (KEFLEX) 500 MG capsule Take 1 capsule (500 mg total) by mouth 4 (four) times daily.  . ciprofloxacin (CIPRO) 250 MG tablet Take 1 tablet (250 mg total) by mouth 2 (two) times daily.  Marland Kitchen enoxaparin (LOVENOX) 80 MG/0.8ML injection Inject 0.75 mLs (75 mg total) into the skin every 12 (twelve) hours.  Marland Kitchen HYDROcodone-acetaminophen (NORCO/VICODIN) 5-325 MG per tablet Take 1 tablet by mouth every 6 (six) hours as needed for moderate pain.  Marland Kitchen loratadine (CLARITIN) 10 MG tablet Take 10 mg by mouth as needed.  Marland Kitchen olopatadine (PATANOL) 0.1 % ophthalmic solution Place 1  drop into both eyes 2 (two) times daily.  . ondansetron (ZOFRAN) 4 MG tablet   . phenazopyridine (PYRIDIUM) 200 MG tablet Take 1 tablet (200 mg total) by mouth 3 (three) times daily as needed for pain.     Orders Placed This Encounter  Procedures  . INR/PT    Return in about 7 days (around 04/14/2014).

## 2014-04-07 NOTE — Assessment & Plan Note (Addendum)
Current dose is 6 mg alt with 5 mg .  Changing to 5 mg alternating with  2 mg while on keflex  Repeat inR next week

## 2014-04-07 NOTE — Patient Instructions (Signed)
No coumadin tonight  5 mg on Tuesday,  Then 2 mg alternating with 5 mg for the next week  We'' recheck your level next week when you return for a wound check  Clean wound only with sterile saline  Change dressing whenever it looks wet  Cephalexin 3 to 4 doses daily to cover any skin infection  Please take a probiotic ( Align, Floraque or Culturelle or the generi equivalent ) while you are on the cephalexin  to prevent a serious antibiotic associated diarrhea  Called clostirudium dificile colitis and a vaginal yeast infection

## 2014-04-07 NOTE — Progress Notes (Signed)
Pre-visit discussion using our clinic review tool. No additional management support is needed unless otherwise documented below in the visit note.  

## 2014-04-08 ENCOUNTER — Encounter: Payer: Self-pay | Admitting: Internal Medicine

## 2014-04-08 DIAGNOSIS — L02419 Cutaneous abscess of limb, unspecified: Secondary | ICD-10-CM | POA: Insufficient documentation

## 2014-04-08 DIAGNOSIS — L02415 Cutaneous abscess of right lower limb: Secondary | ICD-10-CM | POA: Insufficient documentation

## 2014-04-08 DIAGNOSIS — L03119 Cellulitis of unspecified part of limb: Secondary | ICD-10-CM

## 2014-04-08 DIAGNOSIS — L03115 Cellulitis of right lower limb: Secondary | ICD-10-CM

## 2014-04-08 NOTE — Assessment & Plan Note (Signed)
Early empiric teratment given patient's iatrogenic I/C state .  cephalexin called in,  She has tolerated it in the past as well as augmentin despite her allergy list.

## 2014-04-08 NOTE — Assessment & Plan Note (Addendum)
Full thickness debridement of avulsed skin done was offered and advised.  After obtaining informed consent, wound was cleaned with sterile saline followed by betadine.  Scalpel and forceps were used to excise avulsed skin that had become adherent to wound bed.  Procedure was tolerated well and minimal bleeding noted.  Topical bacitracin was applied and wound was dressed  With telfa and leg wrapped in kerlex. Care instructions given.

## 2014-04-09 DIAGNOSIS — Z86010 Personal history of colonic polyps: Secondary | ICD-10-CM | POA: Diagnosis not present

## 2014-04-09 DIAGNOSIS — I2789 Other specified pulmonary heart diseases: Secondary | ICD-10-CM | POA: Diagnosis not present

## 2014-04-09 DIAGNOSIS — I82409 Acute embolism and thrombosis of unspecified deep veins of unspecified lower extremity: Secondary | ICD-10-CM | POA: Diagnosis not present

## 2014-04-10 DIAGNOSIS — H01009 Unspecified blepharitis unspecified eye, unspecified eyelid: Secondary | ICD-10-CM | POA: Diagnosis not present

## 2014-04-14 ENCOUNTER — Ambulatory Visit (INDEPENDENT_AMBULATORY_CARE_PROVIDER_SITE_OTHER): Payer: Medicare Other | Admitting: Internal Medicine

## 2014-04-14 ENCOUNTER — Encounter: Payer: Self-pay | Admitting: Internal Medicine

## 2014-04-14 VITALS — BP 120/80 | HR 92 | Temp 98.7°F | Resp 18 | Ht 64.0 in | Wt 153.5 lb

## 2014-04-14 DIAGNOSIS — Z7901 Long term (current) use of anticoagulants: Secondary | ICD-10-CM

## 2014-04-14 DIAGNOSIS — Z5189 Encounter for other specified aftercare: Secondary | ICD-10-CM | POA: Diagnosis not present

## 2014-04-14 DIAGNOSIS — E538 Deficiency of other specified B group vitamins: Secondary | ICD-10-CM | POA: Diagnosis not present

## 2014-04-14 DIAGNOSIS — S81009A Unspecified open wound, unspecified knee, initial encounter: Secondary | ICD-10-CM | POA: Diagnosis not present

## 2014-04-14 DIAGNOSIS — S81801D Unspecified open wound, right lower leg, subsequent encounter: Secondary | ICD-10-CM

## 2014-04-14 DIAGNOSIS — S81809A Unspecified open wound, unspecified lower leg, initial encounter: Secondary | ICD-10-CM | POA: Diagnosis not present

## 2014-04-14 DIAGNOSIS — S91009A Unspecified open wound, unspecified ankle, initial encounter: Secondary | ICD-10-CM

## 2014-04-14 NOTE — Progress Notes (Signed)
Pre-visit discussion using our clinic review tool. No additional management support is needed unless otherwise documented below in the visit note. Patient has wound to right leg that measures 1.5 cm X 1.cm, area around wound is red.

## 2014-04-15 DIAGNOSIS — Z7901 Long term (current) use of anticoagulants: Secondary | ICD-10-CM | POA: Diagnosis not present

## 2014-04-15 LAB — PROTIME-INR
INR: 2.52 — AB (ref <1.50)
Prothrombin Time: 27.4 seconds — ABNORMAL HIGH (ref 11.6–15.2)

## 2014-04-15 MED ORDER — CYANOCOBALAMIN 1000 MCG/ML IJ SOLN
1000.0000 ug | Freq: Once | INTRAMUSCULAR | Status: AC
  Administered 2014-04-14: 1000 ug via INTRAMUSCULAR

## 2014-04-16 ENCOUNTER — Encounter: Payer: Self-pay | Admitting: Internal Medicine

## 2014-04-16 NOTE — Progress Notes (Signed)
Patient ID: Diana [name], female   DOB: [birth year], 79 y.o.   MRN: [id number]  Patient Active Problem List   Diagnosis Date Noted  . Cellulitis and abscess of leg 04/08/2014  . Avulsion of skin of right lower leg 04/07/2014  . Urinary incontinence 01/15/2014  . Bladder spasms 01/02/2014  . Abnormal laboratory test 01/02/2014  . Other and unspecified hyperlipidemia 08/21/2013  . H/O recurrent pneumonia 05/26/2013  . Long term (current) use of anticoagulants 03/27/2013  . DVT, recurrent, lower extremity, chronic 01/07/2013  . Hiatal hernia 01/06/2013  . Renal mass, left 01/06/2013  . Sciatica of right side 11/20/2012  . Post herpetic neuralgia 11/20/2012  . Disseminated zoster 10/31/2012  . Insomnia 11/08/2011  . LUPUS ERYTHEMATOSUS 06/13/2008  . NEUROPATHY-PERIPHERAL 10/06/2006  . Unspecified Hypothyroidism 09/25/2006  . ANEMIA, B12 DEFICIENCY 09/25/2006  . GERD 09/25/2006  . Systemic Lupus Erythematosus 09/25/2006  . Type II or unspecified type diabetes mellitus without mention of complication, not stated as uncontrolled 09/25/2006  . SHINGLES, HX OF 09/25/2006  . TOBACCO ABUSE, HX OF 09/25/2006    Subjective:  CC:   Chief Complaint  Patient presents with  . Follow-up    wound on right leg    HPI:   Diana [name] is a 79 y.o. female who presents for  1 week follow up on lower extremity skin tear/avulsion which was debrided of devitalized tissue last week.  She has been reducing her dose of coumadin as directed and keeping the wound of out water.  She was prescribed keflex empirically for wound erythema thought to  be early cellulitis.  States that the wound is painful,  Tolerated the medication without diarrhea. No fevers,   Feeling very tired lately.    Past Medical History  Diagnosis Date  . Lupus (systemic lupus erythematosus)   . Pulmonary hypertension   . Pneumonia   . Unspecified menopausal and postmenopausal disorder   . Primary pulmonary hypertension    . Acute glomerulonephritis with other specified pathological lesion in kidney in disease classified elsewhere(580.81)   . Unspecified hypothyroidism   . Shingles (herpes zoster) polyneuropathy March 2013    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    History reviewed. No pertinent past surgical history.     The following portions of the patient's history were reviewed and updated as appropriate: Allergies, current medications, and problem list.    Review of Systems:   Patient denies headache, fevers, malaise, unintentional weight loss, skin rash, eye pain, sinus congestion and sinus pain, sore throat, dysphagia,  hemoptysis , cough, dyspnea, wheezing, chest pain, palpitations, orthopnea, edema, abdominal pain, nausea, melena, diarrhea, constipation, flank pain, dysuria, hematuria, urinary  Frequency, nocturia, numbness, tingling, seizures,  Focal weakness, Loss of consciousness,  Tremor, insomnia, depression, anxiety, and suicidal ideation.     History   Social History  . Marital Status: Divorced    Spouse Name: N/A    Number of Children: N/A  . Years of Education: N/A   Occupational History  . 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 Smoking status: Former Smoker    Quit date: 05/25/1991  . Smokeless tobacco: Never Used  . Alcohol Use: Yes     Comment: rare  . Drug Use: No  . Sexual Activity: Not on file   Other Topics Concern  . Not on file   Social History Narrative  . No narrative on file    Objective:  Filed Vitals:  04/14/14 1819  BP: 120/80  Pulse: 92  Temp: 98.7 F (37.1 C)  Resp: 18     General appearance: alert, cooperative and appears stated age Ears: normal TM's and external ear canals both ears Throat: lips, mucosa, and tongue normal; teeth and gums normal Neck: no adenopathy, no carotid bruit, supple, symmetrical, trachea midline and thyroid not enlarged, symmetric, no tenderness/mass/nodules Back: symmetric,  no curvature. ROM normal. No CVA tenderness. Lungs: clear to auscultation bilaterally Heart: regular rate and rhythm, S1, S2 normal, no murmur, click, rub or gallop Abdomen: soft, non-tender; bowel sounds normal; no masses,  no organomegaly Pulses: 2+ and symmetric Skin: 3 cm superficial wound with food granulating tissue, mild erythema no warmth Lymph nodes: Cervical, supraclavicular, and axillary nodes normal.  Assessment and Plan:  Avulsion of skin of right lower leg Granulating bed is without purulent exudate.  No undermining noted. continue current wound care.  The surrounding erythema appears to be due to venous insufficiency, not cellulitis   Updated Medication List Outpatient Encounter Prescriptions as of 04/14/2014  Medication Sig  . ALPRAZolam (XANAX) 0.5 MG tablet Take 1 tablet (0.5 mg total) by mouth at bedtime as needed for anxiety or sleep.  . cephALEXin (KEFLEX) 500 MG capsule Take 1 capsule (500 mg total) by mouth 4 (four) times daily.  . cyanocobalamin (,VITAMIN B-12,) 1000 MCG/ML injection Inject 1 mL (1,000 mcg total) into the muscle every 30 (thirty) days.  Marland Kitchen estrogen, conjugated,-medroxyprogesterone (PREMPRO) 0.45-1.5 MG per tablet Take 1 tablet by mouth daily.    . fluticasone (FLONASE) 50 MCG/ACT nasal spray 2 sprays in each nostril once daily  . furosemide (LASIX) 20 MG tablet Take 20 mg by mouth daily.  Marland Kitchen HYDROcodone-acetaminophen (NORCO/VICODIN) 5-325 MG per tablet Take 1 tablet by mouth every 6 (six) hours as needed for moderate pain.  . hydroxychloroquine (PLAQUENIL) 200 MG tablet TAKE 1 TABLET EVERY DAY  . loratadine (CLARITIN) 10 MG tablet Take 10 mg by mouth as needed.  . metoprolol tartrate (LOPRESSOR) 25 MG tablet Take 25 mg by mouth daily.   . mycophenolate (CELLCEPT) 500 MG tablet Take 500 mg by mouth 2 (two) times daily. Take two 500 mg tab in the morning and two  500 mg at bed time  . omeprazole (PRILOSEC) 20 MG capsule Take 40 mg by mouth daily.   .  ondansetron (ZOFRAN) 4 MG tablet   . PREDNISONE PO Take 6 mg by mouth daily.   . ranitidine (ZANTAC) 150 MG tablet Take 1 tablet (150 mg total) by mouth at bedtime.  Marland Kitchen SYNTHROID 150 MCG tablet Take 1 tablet (150 mcg total) by mouth daily. Name brand only  . Tadalafil, PAH, (ADCIRCA) 20 MG TABS Take 2 tablets by mouth daily.   . vitamin D, CHOLECALCIFEROL, 400 UNITS tablet Take 400 Units by mouth daily.    Marland Kitchen warfarin (COUMADIN) 1 MG tablet Take as directed by the physician  . warfarin (COUMADIN) 5 MG tablet Take 1 tablet (5 mg total) by mouth daily. Take as directed  . olopatadine (PATANOL) 0.1 % ophthalmic solution Place 1 drop into both eyes 2 (two) times daily.  . [DISCONTINUED] ciprofloxacin (CIPRO) 250 MG tablet Take 1 tablet (250 mg total) by mouth 2 (two) times daily.  . [DISCONTINUED] enoxaparin (LOVENOX) 80 MG/0.8ML injection Inject 0.75 mLs (75 mg total) into the skin every 12 (twelve) hours.  . [DISCONTINUED] phenazopyridine (PYRIDIUM) 200 MG tablet Take 1 tablet (200 mg total) by mouth 3 (three) times daily as needed for  pain.  . [EXPIRED] cyanocobalamin ((VITAMIN B-12)) injection 1,000 mcg      Orders Placed This Encounter  Procedures  . INR/PT    Return in about 1 week (around 04/21/2014).

## 2014-04-16 NOTE — Assessment & Plan Note (Signed)
Granulating bed is without purulent exudate.  No undermining noted. continue current wound care.  The surrounding erythema appears to be due to venous insufficiency, not cellulitis

## 2014-04-18 ENCOUNTER — Ambulatory Visit: Payer: Self-pay | Admitting: Obstetrics and Gynecology

## 2014-04-18 DIAGNOSIS — Z1231 Encounter for screening mammogram for malignant neoplasm of breast: Secondary | ICD-10-CM | POA: Diagnosis not present

## 2014-04-22 ENCOUNTER — Encounter: Payer: Self-pay | Admitting: Internal Medicine

## 2014-04-22 ENCOUNTER — Ambulatory Visit: Payer: Self-pay | Admitting: Internal Medicine

## 2014-04-22 ENCOUNTER — Ambulatory Visit (INDEPENDENT_AMBULATORY_CARE_PROVIDER_SITE_OTHER): Payer: Medicare Other | Admitting: Internal Medicine

## 2014-04-22 VITALS — BP 120/74 | HR 96 | Temp 98.5°F | Resp 14 | Ht 64.0 in | Wt 155.5 lb

## 2014-04-22 DIAGNOSIS — S99919A Unspecified injury of unspecified ankle, initial encounter: Secondary | ICD-10-CM | POA: Diagnosis not present

## 2014-04-22 DIAGNOSIS — L03119 Cellulitis of unspecified part of limb: Secondary | ICD-10-CM

## 2014-04-22 DIAGNOSIS — M25569 Pain in unspecified knee: Secondary | ICD-10-CM

## 2014-04-22 DIAGNOSIS — M7989 Other specified soft tissue disorders: Secondary | ICD-10-CM | POA: Diagnosis not present

## 2014-04-22 DIAGNOSIS — E119 Type 2 diabetes mellitus without complications: Secondary | ICD-10-CM

## 2014-04-22 DIAGNOSIS — M2550 Pain in unspecified joint: Secondary | ICD-10-CM | POA: Diagnosis not present

## 2014-04-22 DIAGNOSIS — Z7901 Long term (current) use of anticoagulants: Secondary | ICD-10-CM

## 2014-04-22 DIAGNOSIS — S81801S Unspecified open wound, right lower leg, sequela: Secondary | ICD-10-CM

## 2014-04-22 DIAGNOSIS — M25561 Pain in right knee: Secondary | ICD-10-CM

## 2014-04-22 DIAGNOSIS — L02419 Cutaneous abscess of limb, unspecified: Secondary | ICD-10-CM

## 2014-04-22 DIAGNOSIS — IMO0002 Reserved for concepts with insufficient information to code with codable children: Secondary | ICD-10-CM

## 2014-04-22 DIAGNOSIS — S8990XA Unspecified injury of unspecified lower leg, initial encounter: Secondary | ICD-10-CM | POA: Diagnosis not present

## 2014-04-22 NOTE — Progress Notes (Signed)
Pre-visit discussion using our clinic review tool. No additional management support is needed unless otherwise documented below in the visit note.  

## 2014-04-22 NOTE — Patient Instructions (Signed)
The wound is improving .  It is not infected.  The redness is from venous insufficiency.  Continue to keep it covered with vaseline and nonstick (hah!) pad  Until you have new skin covering the entire wound   Elevate the leg when resting   X ray (at your leisure) to evaluate the bone underneath  Checking  protime and a1c today

## 2014-04-22 NOTE — Assessment & Plan Note (Addendum)
Currently using 5 mg and 2 mg alternating.  INR is low end of normal after recently finishing a week of abx.,  Will increase dose to 5mg  5 dasy per week and 2.5 mg 2 days per week repeat in 2 weeks.

## 2014-04-22 NOTE — Progress Notes (Signed)
Patient ID: Diana [name], female   DOB: 10-10-1934, 79 y.o.   MRN: [id number]   Patient Active Problem List   Diagnosis Date Noted  . Cellulitis and abscess of leg 04/08/2014  . Avulsion of skin of right lower leg 04/07/2014  . Urinary incontinence 01/15/2014  . Bladder spasms 01/02/2014  . Abnormal laboratory test 01/02/2014  . Other and unspecified hyperlipidemia 08/21/2013  . H/O recurrent pneumonia 05/26/2013  . Long term (current) use of anticoagulants 03/27/2013  . DVT, recurrent, lower extremity, chronic 01/07/2013  . Hiatal hernia 01/06/2013  . Renal mass, left 01/06/2013  . Sciatica of right side 11/20/2012  . Post herpetic neuralgia 11/20/2012  . Disseminated zoster 10/31/2012  . Insomnia 11/08/2011  . LUPUS ERYTHEMATOSUS 06/13/2008  . NEUROPATHY-PERIPHERAL 10/06/2006  . Unspecified Hypothyroidism 09/25/2006  . ANEMIA, B12 DEFICIENCY 09/25/2006  . GERD 09/25/2006  . Systemic Lupus Erythematosus 09/25/2006  . Type II or unspecified type diabetes mellitus without mention of complication, not stated as uncontrolled 09/25/2006  . SHINGLES, HX OF 09/25/2006  . TOBACCO ABUSE, HX OF 09/25/2006    Subjective:  CC:   Chief Complaint  Patient presents with  . Follow-up    wound to right leg.    HPI:   Diana [name] is a 79 y.o. female who presents for Follow up on right lower extremity wound .  The wound was initially treated wtwo weeks ago with full thickness debridement of devitalized tissue and  treated for cellulitis  For one week with keflex given her chronic immunosuppresion for SLE.  The area surrounding the wound has remain enflamed and painful,  But the redness has not progressed proximally or distally and she has noted persistently nonpurulent exudate.  She denies fevers. The wound is getting smaller    Past Medical History  Diagnosis Date  . Lupus (systemic lupus erythematosus)   . Pulmonary hypertension   . Pneumonia   . Unspecified menopausal and  postmenopausal disorder   . Primary pulmonary hypertension   . Acute glomerulonephritis with other specified pathological lesion in kidney in disease classified elsewhere(580.81)   . Unspecified hypothyroidism   . Shingles (herpes zoster) polyneuropathy March 2013    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    History reviewed. No pertinent past surgical history.     The following portions of the patient's history were reviewed and updated as appropriate: Allergies, current medications, and problem list.    Review of Systems:   Patient denies headache, fevers, malaise, unintentional weight loss, skin rash, eye pain, sinus congestion and sinus pain, sore throat, dysphagia,  hemoptysis , cough, dyspnea, wheezing, chest pain, palpitations, orthopnea, edema, abdominal pain, nausea, melena, diarrhea, constipation, flank pain, dysuria, hematuria, urinary  Frequency, nocturia, numbness, tingling, seizures,  Focal weakness, Loss of consciousness,  Tremor, insomnia, depression, anxiety, and suicidal ideation.     History   Social History  . Marital Status: Divorced    Spouse Name: N/A    Number of Children: N/A  . Years of Education: N/A   Occupational History  . 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 Smoking status: Former Smoker    Quit date: 05/25/1991  . Smokeless tobacco: Never Used  . Alcohol Use: Yes     Comment: rare  . Drug Use: No  . Sexual Activity: Not on file   Other Topics Concern  . Not on file   Social History Narrative  . No narrative on file  Objective:  Filed Vitals:   04/22/14 1406  BP: 120/74  Pulse: 96  Temp: 98.5 F (36.9 C)  Resp: 14     General appearance: alert, cooperative and appears stated age Ears: normal TM's and external ear canals both ears Throat: lips, mucosa, and tongue normal; teeth and gums normal Neck: no adenopathy, no carotid bruit, supple, symmetrical, trachea midline and thyroid not  enlarged, symmetric, no tenderness/mass/nodules Back: symmetric, no curvature. ROM normal. No CVA tenderness. Lungs: clear to auscultation bilaterally Heart: regular rate and rhythm, S1, S2 normal, no murmur, click, rub or gallop Abdomen: soft, non-tender; bowel sounds normal; no masses,  no organomegaly Pulses: 2+ and symmetric Skin: LE avulsion with abundant granulation tissue.   Mild erythema noted with no progression.  No warmth or purulent exudate.  Lymph nodes: Cervical, supraclavicular, and axillary nodes normal.  Assessment and Plan:  Long term (current) use of anticoagulants Currently using 5 mg and 2 mg alternating.  INR is low end of normal after recently finishing a week of abx.,  Will increase dose to 5mg  5 dasy per week and 2.5 mg 2 days per week repeat in 2 weeks.   Avulsion of skin of right lower leg The wound is slow to heal due to history of DM Type 2.  venous insufficiency and chronic immunosuppression foe management of SLE .  Plain films today show no involvement of bone.    Type II or unspecified type diabetes mellitus without mention of complication, not stated as uncontrolled   Managed with dietary restraints.  Improved control on diet alone .Marland Kitchen Patient is up-to-date on eye exams and foot exam was done today.  There is  no proteinuria on recent micro urinalysis .  Fasting lipids have been reviewed and statin therapy has been advised and updated according to new ACC guidelines based on patient's 10 year risk of CAD   Lab Results  Component Value Date   HGBA1C 6.9* 04/22/2014   Lab Results  Component Value Date   MICROALBUR 0.9 10/11/2013         Cellulitis and abscess of leg Leg is unchanged despite a week of keflex which was done empirically givne history of i/s due to SLE.  I do not think it is infected.    Updated Medication List Outpatient Encounter Prescriptions as of 04/22/2014  Medication Sig  . ALPRAZolam (XANAX) 0.5 MG tablet Take 1 tablet (0.5 mg  total) by mouth at bedtime as needed for anxiety or sleep.  . cyanocobalamin (,VITAMIN B-12,) 1000 MCG/ML injection Inject 1 mL (1,000 mcg total) into the muscle every 30 (thirty) days.  Marland Kitchen estrogen, conjugated,-medroxyprogesterone (PREMPRO) 0.45-1.5 MG per tablet Take 1 tablet by mouth daily.    . fluticasone (FLONASE) 50 MCG/ACT nasal spray 2 sprays in each nostril once daily  . furosemide (LASIX) 20 MG tablet Take 20 mg by mouth daily.  Marland Kitchen HYDROcodone-acetaminophen (NORCO/VICODIN) 5-325 MG per tablet Take 1 tablet by mouth every 6 (six) hours as needed for moderate pain.  . hydroxychloroquine (PLAQUENIL) 200 MG tablet TAKE 1 TABLET EVERY DAY  . loratadine (CLARITIN) 10 MG tablet Take 10 mg by mouth as needed.  . metoprolol tartrate (LOPRESSOR) 25 MG tablet Take 25 mg by mouth daily.   . mycophenolate (CELLCEPT) 500 MG tablet Take 500 mg by mouth 2 (two) times daily. Take two 500 mg tab in the morning and two  500 mg at bed time  . olopatadine (PATANOL) 0.1 % ophthalmic solution Place 1 drop into  both eyes 2 (two) times daily.  Marland Kitchen omeprazole (PRILOSEC) 20 MG capsule Take 40 mg by mouth daily.   . ondansetron (ZOFRAN) 4 MG tablet   . PREDNISONE PO Take 6 mg by mouth daily.   . ranitidine (ZANTAC) 150 MG tablet Take 1 tablet (150 mg total) by mouth at bedtime.  Marland Kitchen SYNTHROID 150 MCG tablet Take 1 tablet (150 mcg total) by mouth daily. Name brand only  . Tadalafil, PAH, (ADCIRCA) 20 MG TABS Take 2 tablets by mouth daily.   . vitamin D, CHOLECALCIFEROL, 400 UNITS tablet Take 400 Units by mouth daily.    Marland Kitchen warfarin (COUMADIN) 1 MG tablet Take as directed by the physician  . warfarin (COUMADIN) 5 MG tablet Take 1 tablet (5 mg total) by mouth daily. Take as directed  . cephALEXin (KEFLEX) 500 MG capsule Take 1 capsule (500 mg total) by mouth 4 (four) times daily.     Orders Placed This Encounter  Procedures  . DG Tibia/Fibula Right  . Hemoglobin A1c    Return in about 2 weeks (around  05/06/2014).

## 2014-04-23 LAB — PROTIME-INR
INR: 2.1 ratio — ABNORMAL HIGH (ref 0.8–1.0)
PROTHROMBIN TIME: 22.8 s — AB (ref 9.6–13.1)

## 2014-04-23 LAB — HEMOGLOBIN A1C: Hgb A1c MFr Bld: 6.9 % — ABNORMAL HIGH (ref 4.6–6.5)

## 2014-04-24 ENCOUNTER — Telehealth: Payer: Self-pay | Admitting: Internal Medicine

## 2014-04-24 NOTE — Telephone Encounter (Signed)
Her leg x ray was negative for fractures, foreign bodies, or signs of soft tissue infection

## 2014-04-24 NOTE — Telephone Encounter (Signed)
Patient notified and voiced understanding.

## 2014-04-25 ENCOUNTER — Ambulatory Visit: Payer: Self-pay | Admitting: Obstetrics and Gynecology

## 2014-04-25 ENCOUNTER — Encounter: Payer: Self-pay | Admitting: Internal Medicine

## 2014-04-25 DIAGNOSIS — N63 Unspecified lump in unspecified breast: Secondary | ICD-10-CM | POA: Diagnosis not present

## 2014-04-25 NOTE — Assessment & Plan Note (Signed)
The wound is slow to heal due to history of DM Type 2.  venous insufficiency and chronic immunosuppression foe management of SLE .  Plain films today show no involvement of bone.

## 2014-04-25 NOTE — Assessment & Plan Note (Signed)
  Managed with dietary restraints.  Improved control on diet alone .Marland Kitchen Patient is up-to-date on eye exams and foot exam was done today.  There is  no proteinuria on recent micro urinalysis .  Fasting lipids have been reviewed and statin therapy has been advised and updated according to new ACC guidelines based on patient's 10 year risk of CAD   Lab Results  Component Value Date   HGBA1C 6.9* 04/22/2014   Lab Results  Component Value Date   MICROALBUR 0.9 10/11/2013

## 2014-04-25 NOTE — Assessment & Plan Note (Signed)
Leg is unchanged despite a week of keflex which was done empirically givne history of i/s due to SLE.  I do not think it is infected.

## 2014-04-28 ENCOUNTER — Telehealth: Payer: Self-pay | Admitting: Internal Medicine

## 2014-04-28 MED ORDER — ENOXAPARIN SODIUM 80 MG/0.8ML ~~LOC~~ SOLN: 1.0000 mg/kg | Freq: Two times a day (BID) | SUBCUTANEOUS | Status: DC

## 2014-04-28 NOTE — Telephone Encounter (Signed)
Patient GYN Dr. Quenten Raven  Ordered Mammogram on patient and they are recommending biopsy but would like for patient to discontiue coumadin for 5 days for biopsy. Patient is concerned about being off anticoagulant for that period of time patient wanted to know if she could take Novalox for a couple of days before biopsy .

## 2014-04-28 NOTE — Telephone Encounter (Signed)
Yes she can take LOVENOX for 3 days  During the 5 day break from coumadin ,  Last dose the night before the biopsy.  Resume coumadin the night following the biopsy  #6   The rx has been printed

## 2014-04-29 DIAGNOSIS — Z5181 Encounter for therapeutic drug level monitoring: Secondary | ICD-10-CM | POA: Diagnosis not present

## 2014-04-29 NOTE — Telephone Encounter (Signed)
Notified patient of dosage and schedule for LOVENOX, patient voiced understanding with repeat of dosing. Script awaiting signature.

## 2014-04-30 ENCOUNTER — Encounter: Payer: Self-pay | Admitting: *Deleted

## 2014-04-30 ENCOUNTER — Telehealth: Payer: Self-pay | Admitting: Internal Medicine

## 2014-04-30 NOTE — Telephone Encounter (Signed)
Diana [name] from [facility] called to enquire of patient coumadin regimen prior to biopsy and to have order to stop coumadin sent to [facility] along with order for novelox. I have printed a letter should suffice please advise.

## 2014-05-05 ENCOUNTER — Ambulatory Visit: Payer: Self-pay | Admitting: Obstetrics and Gynecology

## 2014-05-05 DIAGNOSIS — C50919 Malignant neoplasm of unspecified site of unspecified female breast: Secondary | ICD-10-CM | POA: Diagnosis not present

## 2014-05-05 DIAGNOSIS — N63 Unspecified lump in unspecified breast: Secondary | ICD-10-CM | POA: Diagnosis not present

## 2014-05-06 ENCOUNTER — Telehealth: Payer: Self-pay | Admitting: Internal Medicine

## 2014-05-06 NOTE — Telephone Encounter (Signed)
Patient is not to come in for coumadin lab on 05/07/14 patient  Has been notified.

## 2014-05-07 ENCOUNTER — Other Ambulatory Visit: Payer: Medicare Other

## 2014-05-09 ENCOUNTER — Other Ambulatory Visit: Payer: Self-pay | Admitting: *Deleted

## 2014-05-09 MED ORDER — ENOXAPARIN SODIUM 80 MG/0.8ML ~~LOC~~ SOLN: 1.0000 mg/kg | Freq: Two times a day (BID) | SUBCUTANEOUS | Status: DC

## 2014-05-12 ENCOUNTER — Ambulatory Visit: Payer: Self-pay | Admitting: Anesthesiology

## 2014-05-12 DIAGNOSIS — M545 Low back pain, unspecified: Secondary | ICD-10-CM | POA: Diagnosis not present

## 2014-05-12 DIAGNOSIS — M48061 Spinal stenosis, lumbar region without neurogenic claudication: Secondary | ICD-10-CM | POA: Diagnosis not present

## 2014-05-14 LAB — PATHOLOGY REPORT

## 2014-05-16 ENCOUNTER — Encounter: Payer: Self-pay | Admitting: Internal Medicine

## 2014-05-16 DIAGNOSIS — C50919 Malignant neoplasm of unspecified site of unspecified female breast: Secondary | ICD-10-CM | POA: Diagnosis not present

## 2014-05-22 ENCOUNTER — Ambulatory Visit: Payer: Self-pay | Admitting: Oncology

## 2014-05-22 DIAGNOSIS — C50419 Malignant neoplasm of upper-outer quadrant of unspecified female breast: Secondary | ICD-10-CM | POA: Diagnosis not present

## 2014-05-22 DIAGNOSIS — E039 Hypothyroidism, unspecified: Secondary | ICD-10-CM | POA: Diagnosis not present

## 2014-05-22 DIAGNOSIS — Z170 Estrogen receptor positive status [ER+]: Secondary | ICD-10-CM | POA: Diagnosis not present

## 2014-05-22 DIAGNOSIS — Z9010 Acquired absence of unspecified breast and nipple: Secondary | ICD-10-CM | POA: Diagnosis not present

## 2014-05-22 DIAGNOSIS — Z79899 Other long term (current) drug therapy: Secondary | ICD-10-CM | POA: Diagnosis not present

## 2014-05-22 DIAGNOSIS — Z5181 Encounter for therapeutic drug level monitoring: Secondary | ICD-10-CM | POA: Diagnosis not present

## 2014-05-22 DIAGNOSIS — I10 Essential (primary) hypertension: Secondary | ICD-10-CM | POA: Diagnosis not present

## 2014-05-22 DIAGNOSIS — Z86718 Personal history of other venous thrombosis and embolism: Secondary | ICD-10-CM | POA: Diagnosis not present

## 2014-05-22 DIAGNOSIS — M329 Systemic lupus erythematosus, unspecified: Secondary | ICD-10-CM | POA: Diagnosis not present

## 2014-05-22 DIAGNOSIS — Z7901 Long term (current) use of anticoagulants: Secondary | ICD-10-CM | POA: Diagnosis not present

## 2014-05-24 ENCOUNTER — Other Ambulatory Visit: Payer: Self-pay | Admitting: Internal Medicine

## 2014-05-26 ENCOUNTER — Encounter: Payer: Self-pay | Admitting: Internal Medicine

## 2014-05-26 DIAGNOSIS — C50419 Malignant neoplasm of upper-outer quadrant of unspecified female breast: Secondary | ICD-10-CM | POA: Insufficient documentation

## 2014-05-26 DIAGNOSIS — C50912 Malignant neoplasm of unspecified site of left female breast: Secondary | ICD-10-CM | POA: Insufficient documentation

## 2014-05-28 DIAGNOSIS — H01009 Unspecified blepharitis unspecified eye, unspecified eyelid: Secondary | ICD-10-CM | POA: Diagnosis not present

## 2014-05-29 ENCOUNTER — Ambulatory Visit: Payer: Self-pay | Admitting: Surgery

## 2014-05-29 DIAGNOSIS — Z7901 Long term (current) use of anticoagulants: Secondary | ICD-10-CM | POA: Diagnosis not present

## 2014-05-29 DIAGNOSIS — I499 Cardiac arrhythmia, unspecified: Secondary | ICD-10-CM | POA: Diagnosis not present

## 2014-05-29 DIAGNOSIS — I10 Essential (primary) hypertension: Secondary | ICD-10-CM | POA: Diagnosis not present

## 2014-05-29 DIAGNOSIS — C50919 Malignant neoplasm of unspecified site of unspecified female breast: Secondary | ICD-10-CM | POA: Diagnosis not present

## 2014-05-29 DIAGNOSIS — Z79899 Other long term (current) drug therapy: Secondary | ICD-10-CM | POA: Diagnosis not present

## 2014-05-29 DIAGNOSIS — Z01810 Encounter for preprocedural cardiovascular examination: Secondary | ICD-10-CM | POA: Diagnosis not present

## 2014-05-29 DIAGNOSIS — Z01812 Encounter for preprocedural laboratory examination: Secondary | ICD-10-CM | POA: Diagnosis not present

## 2014-05-29 DIAGNOSIS — E039 Hypothyroidism, unspecified: Secondary | ICD-10-CM | POA: Diagnosis not present

## 2014-05-29 DIAGNOSIS — Z86718 Personal history of other venous thrombosis and embolism: Secondary | ICD-10-CM | POA: Diagnosis not present

## 2014-05-29 DIAGNOSIS — E785 Hyperlipidemia, unspecified: Secondary | ICD-10-CM | POA: Diagnosis not present

## 2014-05-29 LAB — CBC WITH DIFFERENTIAL/PLATELET
BASOS ABS: 0.1 10*3/uL (ref 0.0–0.1)
Basophil %: 1.4 %
EOS ABS: 0.1 10*3/uL (ref 0.0–0.7)
EOS PCT: 1.3 %
HCT: 37.1 % (ref 35.0–47.0)
HGB: 11.4 g/dL — ABNORMAL LOW (ref 12.0–16.0)
Lymphocyte #: 1.2 10*3/uL (ref 1.0–3.6)
Lymphocyte %: 23.9 %
MCH: 28.3 pg (ref 26.0–34.0)
MCHC: 30.7 g/dL — ABNORMAL LOW (ref 32.0–36.0)
MCV: 92 fL (ref 80–100)
Monocyte #: 0.5 x10 3/mm (ref 0.2–0.9)
Monocyte %: 9.9 %
Neutrophil #: 3.2 10*3/uL (ref 1.4–6.5)
Neutrophil %: 63.5 %
Platelet: 254 10*3/uL (ref 150–440)
RBC: 4.02 10*6/uL (ref 3.80–5.20)
RDW: 15.4 % — ABNORMAL HIGH (ref 11.5–14.5)
WBC: 5.1 10*3/uL (ref 3.6–11.0)

## 2014-05-29 LAB — BASIC METABOLIC PANEL
ANION GAP: 4 — AB (ref 7–16)
BUN: 26 mg/dL — ABNORMAL HIGH (ref 7–18)
CREATININE: 1.33 mg/dL — AB (ref 0.60–1.30)
Calcium, Total: 8.8 mg/dL (ref 8.5–10.1)
Chloride: 109 mmol/L — ABNORMAL HIGH (ref 98–107)
Co2: 31 mmol/L (ref 21–32)
EGFR (African American): 45 — ABNORMAL LOW
GFR CALC NON AF AMER: 38 — AB
Glucose: 96 mg/dL (ref 65–99)
Osmolality: 291 (ref 275–301)
Potassium: 3.8 mmol/L (ref 3.5–5.1)
Sodium: 144 mmol/L (ref 136–145)

## 2014-05-29 LAB — APTT: ACTIVATED PTT: 40.4 s — AB (ref 23.6–35.9)

## 2014-05-29 LAB — PROTIME-INR
INR: 2.9
Prothrombin Time: 29.2 secs — ABNORMAL HIGH (ref 11.5–14.7)

## 2014-06-03 ENCOUNTER — Ambulatory Visit: Payer: Self-pay | Admitting: Surgery

## 2014-06-03 DIAGNOSIS — Z9889 Other specified postprocedural states: Secondary | ICD-10-CM | POA: Diagnosis not present

## 2014-06-03 DIAGNOSIS — N63 Unspecified lump in unspecified breast: Secondary | ICD-10-CM | POA: Diagnosis not present

## 2014-06-03 DIAGNOSIS — C50419 Malignant neoplasm of upper-outer quadrant of unspecified female breast: Secondary | ICD-10-CM | POA: Diagnosis not present

## 2014-06-03 DIAGNOSIS — Z86010 Personal history of colonic polyps: Secondary | ICD-10-CM | POA: Diagnosis not present

## 2014-06-03 DIAGNOSIS — Z801 Family history of malignant neoplasm of trachea, bronchus and lung: Secondary | ICD-10-CM | POA: Diagnosis not present

## 2014-06-03 DIAGNOSIS — M069 Rheumatoid arthritis, unspecified: Secondary | ICD-10-CM | POA: Diagnosis not present

## 2014-06-03 DIAGNOSIS — Z79899 Other long term (current) drug therapy: Secondary | ICD-10-CM | POA: Diagnosis not present

## 2014-06-03 DIAGNOSIS — Z85828 Personal history of other malignant neoplasm of skin: Secondary | ICD-10-CM | POA: Diagnosis not present

## 2014-06-03 DIAGNOSIS — G5790 Unspecified mononeuropathy of unspecified lower limb: Secondary | ICD-10-CM | POA: Diagnosis not present

## 2014-06-03 DIAGNOSIS — C50919 Malignant neoplasm of unspecified site of unspecified female breast: Secondary | ICD-10-CM

## 2014-06-03 DIAGNOSIS — Z800 Family history of malignant neoplasm of digestive organs: Secondary | ICD-10-CM | POA: Diagnosis not present

## 2014-06-03 DIAGNOSIS — I2789 Other specified pulmonary heart diseases: Secondary | ICD-10-CM | POA: Diagnosis not present

## 2014-06-03 DIAGNOSIS — I509 Heart failure, unspecified: Secondary | ICD-10-CM | POA: Diagnosis not present

## 2014-06-03 DIAGNOSIS — Z803 Family history of malignant neoplasm of breast: Secondary | ICD-10-CM | POA: Diagnosis not present

## 2014-06-03 DIAGNOSIS — I4891 Unspecified atrial fibrillation: Secondary | ICD-10-CM | POA: Diagnosis not present

## 2014-06-03 DIAGNOSIS — R599 Enlarged lymph nodes, unspecified: Secondary | ICD-10-CM | POA: Diagnosis not present

## 2014-06-03 DIAGNOSIS — Z7901 Long term (current) use of anticoagulants: Secondary | ICD-10-CM | POA: Diagnosis not present

## 2014-06-03 DIAGNOSIS — Z86718 Personal history of other venous thrombosis and embolism: Secondary | ICD-10-CM | POA: Diagnosis not present

## 2014-06-03 DIAGNOSIS — J99 Respiratory disorders in diseases classified elsewhere: Secondary | ICD-10-CM | POA: Diagnosis not present

## 2014-06-03 DIAGNOSIS — E039 Hypothyroidism, unspecified: Secondary | ICD-10-CM | POA: Diagnosis not present

## 2014-06-03 DIAGNOSIS — M329 Systemic lupus erythematosus, unspecified: Secondary | ICD-10-CM | POA: Diagnosis not present

## 2014-06-03 DIAGNOSIS — D0590 Unspecified type of carcinoma in situ of unspecified breast: Secondary | ICD-10-CM | POA: Diagnosis not present

## 2014-06-03 DIAGNOSIS — E785 Hyperlipidemia, unspecified: Secondary | ICD-10-CM | POA: Diagnosis not present

## 2014-06-03 HISTORY — DX: Malignant neoplasm of unspecified site of unspecified female breast: C50.919

## 2014-06-08 ENCOUNTER — Emergency Department: Payer: Self-pay | Admitting: Internal Medicine

## 2014-06-08 DIAGNOSIS — Z9889 Other specified postprocedural states: Secondary | ICD-10-CM | POA: Diagnosis not present

## 2014-06-08 DIAGNOSIS — F411 Generalized anxiety disorder: Secondary | ICD-10-CM | POA: Diagnosis not present

## 2014-06-08 DIAGNOSIS — M79609 Pain in unspecified limb: Secondary | ICD-10-CM | POA: Diagnosis not present

## 2014-06-08 DIAGNOSIS — R209 Unspecified disturbances of skin sensation: Secondary | ICD-10-CM | POA: Diagnosis not present

## 2014-06-08 DIAGNOSIS — I10 Essential (primary) hypertension: Secondary | ICD-10-CM | POA: Diagnosis not present

## 2014-06-08 DIAGNOSIS — M7989 Other specified soft tissue disorders: Secondary | ICD-10-CM | POA: Diagnosis not present

## 2014-06-08 LAB — COMPREHENSIVE METABOLIC PANEL
Albumin: 3.3 g/dL — ABNORMAL LOW (ref 3.4–5.0)
Alkaline Phosphatase: 49 U/L
Anion Gap: 11 (ref 7–16)
BUN: 18 mg/dL (ref 7–18)
Bilirubin,Total: 0.3 mg/dL (ref 0.2–1.0)
CALCIUM: 8.9 mg/dL (ref 8.5–10.1)
Chloride: 109 mmol/L — ABNORMAL HIGH (ref 98–107)
Co2: 27 mmol/L (ref 21–32)
Creatinine: 1.29 mg/dL (ref 0.60–1.30)
EGFR (African American): 46 — ABNORMAL LOW
EGFR (Non-African Amer.): 40 — ABNORMAL LOW
GLUCOSE: 105 mg/dL — AB (ref 65–99)
Osmolality: 295 (ref 275–301)
Potassium: 4.2 mmol/L (ref 3.5–5.1)
SGOT(AST): 23 U/L (ref 15–37)
SGPT (ALT): 21 U/L
SODIUM: 147 mmol/L — AB (ref 136–145)
Total Protein: 6.7 g/dL (ref 6.4–8.2)

## 2014-06-08 LAB — CBC
HCT: 37 % (ref 35.0–47.0)
HGB: 11.7 g/dL — ABNORMAL LOW (ref 12.0–16.0)
MCH: 29.3 pg (ref 26.0–34.0)
MCHC: 31.8 g/dL — ABNORMAL LOW (ref 32.0–36.0)
MCV: 92 fL (ref 80–100)
Platelet: 225 10*3/uL (ref 150–440)
RBC: 4.02 10*6/uL (ref 3.80–5.20)
RDW: 15.9 % — ABNORMAL HIGH (ref 11.5–14.5)
WBC: 5.1 10*3/uL (ref 3.6–11.0)

## 2014-06-08 LAB — PROTIME-INR
INR: 1.6
PROTHROMBIN TIME: 18.3 s — AB (ref 11.5–14.7)

## 2014-06-09 ENCOUNTER — Telehealth: Payer: Self-pay | Admitting: Internal Medicine

## 2014-06-09 NOTE — Telephone Encounter (Signed)
Patient wen to ED on 06/08/14 was seen for swelling and pain in left foot and lower leg. A ultra sound was done to Rule out DVT. Patient stated Came back good but was advised to follow up with MD, patient leg decreased swelling and pain with TED hose, but after removing pain and swelling has returned. Patient wanted to see MD appointment made for 2.15 pm 06/10/14. FYI

## 2014-06-10 ENCOUNTER — Ambulatory Visit (INDEPENDENT_AMBULATORY_CARE_PROVIDER_SITE_OTHER): Payer: Medicare Other | Admitting: Internal Medicine

## 2014-06-10 ENCOUNTER — Encounter: Payer: Self-pay | Admitting: Internal Medicine

## 2014-06-10 VITALS — BP 120/78 | HR 97 | Temp 98.3°F | Resp 16 | Wt 153.0 lb

## 2014-06-10 DIAGNOSIS — Z7901 Long term (current) use of anticoagulants: Secondary | ICD-10-CM

## 2014-06-10 DIAGNOSIS — IMO0001 Reserved for inherently not codable concepts without codable children: Secondary | ICD-10-CM

## 2014-06-10 DIAGNOSIS — C50919 Malignant neoplasm of unspecified site of unspecified female breast: Secondary | ICD-10-CM

## 2014-06-10 DIAGNOSIS — C50912 Malignant neoplasm of unspecified site of left female breast: Secondary | ICD-10-CM

## 2014-06-10 DIAGNOSIS — I872 Venous insufficiency (chronic) (peripheral): Secondary | ICD-10-CM

## 2014-06-10 DIAGNOSIS — I82509 Chronic embolism and thrombosis of unspecified deep veins of unspecified lower extremity: Secondary | ICD-10-CM

## 2014-06-10 MED ORDER — HYDROCODONE-ACETAMINOPHEN 5-325 MG PO TABS: 1.0000 | ORAL_TABLET | Freq: Four times a day (QID) | ORAL | Status: DC | PRN

## 2014-06-10 MED ORDER — ALPRAZOLAM 0.5 MG PO TABS: 0.5000 mg | ORAL_TABLET | Freq: Every evening | ORAL | Status: DC | PRN

## 2014-06-10 MED ORDER — ONDANSETRON HCL 4 MG PO TABS
4.0000 mg | ORAL_TABLET | Freq: Three times a day (TID) | ORAL | Status: DC | PRN
Start: 2014-06-10 — End: 2015-03-11

## 2014-06-10 NOTE — Progress Notes (Signed)
Pre-visit discussion using our clinic review tool. No additional management support is needed unless otherwise documented below in the visit note.  

## 2014-06-10 NOTE — Patient Instructions (Signed)
Let's repeat your INR next Wednesday  Sept 30th  Remember to wear stockings all day (before you get out of bed, preferably) and take them off at bedtime

## 2014-06-10 NOTE — Progress Notes (Signed)
Patient ID: Diana [name], female   DOB: [birth year], 79 y.o.   MRN: [id number]  Patient Active Problem List   Diagnosis Date Noted  . Venous insufficiency of both lower extremities 06/13/2014  . Breast cancer 05/26/2014  . Cellulitis and abscess of leg 04/08/2014  . Avulsion of skin of right lower leg 04/07/2014  . Urinary incontinence 01/15/2014  . Bladder spasms 01/02/2014  . Abnormal laboratory test 01/02/2014  . Other and unspecified hyperlipidemia 08/21/2013  . H/O recurrent pneumonia 05/26/2013  . Long term (current) use of anticoagulants 03/27/2013  . DVT, recurrent, lower extremity, chronic 01/07/2013  . Hiatal hernia 01/06/2013  . Renal mass, left 01/06/2013  . Sciatica of right side 11/20/2012  . Post herpetic neuralgia 11/20/2012  . Disseminated zoster 10/31/2012  . Insomnia 11/08/2011  . LUPUS ERYTHEMATOSUS 06/13/2008  . NEUROPATHY-PERIPHERAL 10/06/2006  . Unspecified Hypothyroidism 09/25/2006  . ANEMIA, B12 DEFICIENCY 09/25/2006  . GERD 09/25/2006  . Systemic Lupus Erythematosus 09/25/2006  . Type II or unspecified type diabetes mellitus without mention of complication, not stated as uncontrolled 09/25/2006  . SHINGLES, HX OF 09/25/2006  . TOBACCO ABUSE, HX OF 09/25/2006    Subjective:  CC:   Chief Complaint  Patient presents with  . Acute Visit    Left leg and foot pain. had partial mastectomy 1 week ago today. and  2 lymphnodes removed.    HPI:   ERMILA [name] is a 79 y.o. female who presents for ER follow up for left leg pain and swelling.   S/p lumpectomy last week left breast by Renda Rolls    Scar looks fine   Left leg pain .  Started with left foot numbness on Saturday, then entire leg started aching on Sunday  She had suspended coumadin for her lumpectomy on the 15th but had been back on it for 6 days and was told in [place] that [facility] nearly therapeutic   Ultrasound of leg was negative for DVT    Past Medical History  Diagnosis Date  . Lupus  (systemic lupus erythematosus)   . Pulmonary hypertension   . Pneumonia   . Unspecified menopausal and postmenopausal disorder   . Primary pulmonary hypertension   . Acute glomerulonephritis with other specified pathological lesion in kidney in disease classified elsewhere(580.81)   . Unspecified hypothyroidism   . Shingles (herpes zoster) polyneuropathy March 2013    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    No past surgical history on file.     The following portions of the patient's history were reviewed and updated as appropriate: Allergies, current medications, and problem list.    Review of Systems:   Patient denies headache, fevers, malaise, unintentional weight loss, skin rash, eye pain, sinus congestion and sinus pain, sore throat, dysphagia,  hemoptysis , cough, dyspnea, wheezing, chest pain, palpitations, orthopnea, edema, abdominal pain, nausea, melena, diarrhea, constipation, flank pain, dysuria, hematuria, urinary  Frequency, nocturia, numbness, tingling, seizures,  Focal weakness, Loss of consciousness,  Tremor, insomnia, depression, anxiety, and suicidal ideation.     History   Social History  . Marital Status: Divorced    Spouse Name: N/A    Number of Children: N/A  . Years of Education: N/A   Occupational History  . 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 Smoking status: Former Smoker    Quit date: 05/25/1991  . Smokeless tobacco: Never Used  . Alcohol Use: Yes     Comment: rare  .  Drug Use: No  . Sexual Activity: Not on file   Other Topics Concern  . Not on file   Social History Narrative  . No narrative on file    Objective:  Filed Vitals:   06/10/14 1454  BP: 120/78  Pulse: 97  Temp: 98.3 F (36.8 C)  Resp: 16     General appearance: alert, cooperative and appears stated age Ears: normal TM's and external ear canals both ears Throat: lips, mucosa, and tongue normal; teeth and gums normal Neck: no  adenopathy, no carotid bruit, supple, symmetrical, trachea midline and thyroid not enlarged, symmetric, no tenderness/mass/nodules Back: symmetric, no curvature. ROM normal. No CVA tenderness. Lungs: clear to auscultation bilaterally Heart: regular rate and rhythm, S1, S2 normal, no murmur, click, rub or gallop Abdomen: soft, non-tender; bowel sounds normal; no masses,  no organomegaly Pulses: 2+ and symmetric Skin: Skin color, texture, turgor normal. No rashes or lesions Lymph nodes: Cervical, supraclavicular, and axillary nodes normal.  Assessment and Plan:  DVT, recurrent, lower extremity, chronic Last inr was low to due to temporary cessation of warfarin for procedure.  She has resumed 6 mg daily and will recheck INR in 1 week Lab Results  Component Value Date   INR 2.1* 04/22/2014   INR 2.52* 04/15/2014   INR 2.4* 04/02/2014       Long term (current) use of anticoagulants Return for INR next week.   Venous insufficiency of both lower extremities Encouraged to use compression garments daily.   Breast cancer S/p partial mastectomy last week by Renda Rolls.  Incision is healing well per exam today.  Treatment at [facility] pending results of path.    Updated Medication List Outpatient Encounter Prescriptions as of 06/10/2014  Medication Sig  . ALPRAZolam (XANAX) 0.5 MG tablet Take 1 tablet (0.5 mg total) by mouth at bedtime as needed for anxiety or sleep.  . cyanocobalamin (,VITAMIN B-12,) 1000 MCG/ML injection Inject 1 mL (1,000 mcg total) into the muscle every 30 (thirty) days.  . fluticasone (FLONASE) 50 MCG/ACT nasal spray 2 sprays in each nostril once daily  . furosemide (LASIX) 20 MG tablet Take 20 mg by mouth daily.  Marland Kitchen HYDROcodone-acetaminophen (NORCO/VICODIN) 5-325 MG per tablet Take 1 tablet by mouth every 6 (six) hours as needed for moderate pain.  . hydroxychloroquine (PLAQUENIL) 200 MG tablet TAKE 1 TABLET EVERY DAY  . metoprolol tartrate (LOPRESSOR) 25 MG  tablet Take 25 mg by mouth daily.   . mycophenolate (CELLCEPT) 500 MG tablet Take 500 mg by mouth 2 (two) times daily. Take two 500 mg tab in the morning and two  500 mg at bed time  . omeprazole (PRILOSEC) 20 MG capsule Take 40 mg by mouth daily.   . ondansetron (ZOFRAN) 4 MG tablet Take 1 tablet (4 mg total) by mouth every 8 (eight) hours as needed for nausea or vomiting.  Marland Kitchen PREDNISONE PO Take 6 mg by mouth daily.   . ranitidine (ZANTAC) 150 MG tablet Take 1 tablet (150 mg total) by mouth at bedtime.  Marland Kitchen SYNTHROID 150 MCG tablet TAKE 1 TABLET (150 MCG TOTAL) BY MOUTH DAILY. NAME BRAND ONLY  . Tadalafil, PAH, (ADCIRCA) 20 MG TABS Take 2 tablets by mouth daily.   Marland Kitchen warfarin (COUMADIN) 1 MG tablet Take as directed by the physician  . warfarin (COUMADIN) 5 MG tablet Take 1 tablet (5 mg total) by mouth daily. Take as directed  . [DISCONTINUED] ALPRAZolam (XANAX) 0.5 MG tablet Take 1 tablet (0.5 mg total)  by mouth at bedtime as needed for anxiety or sleep.  . [DISCONTINUED] HYDROcodone-acetaminophen (NORCO/VICODIN) 5-325 MG per tablet Take 1 tablet by mouth every 6 (six) hours as needed for moderate pain.  . [DISCONTINUED] ondansetron (ZOFRAN) 4 MG tablet   . [DISCONTINUED] cephALEXin (KEFLEX) 500 MG capsule Take 1 capsule (500 mg total) by mouth 4 (four) times daily.  . [DISCONTINUED] enoxaparin (LOVENOX) 80 MG/0.8ML injection Inject 0.7 mLs (70 mg total) into the skin every 12 (twelve) hours. For 3 days,   Qty 6 syringes  . [DISCONTINUED] estrogen, conjugated,-medroxyprogesterone (PREMPRO) 0.45-1.5 MG per tablet Take 1 tablet by mouth daily.    . [DISCONTINUED] loratadine (CLARITIN) 10 MG tablet Take 10 mg by mouth as needed.  . [DISCONTINUED] olopatadine (PATANOL) 0.1 % ophthalmic solution Place 1 drop into both eyes 2 (two) times daily.  . [DISCONTINUED] vitamin D, CHOLECALCIFEROL, 400 UNITS tablet Take 400 Units by mouth daily.       No orders of the defined types were placed in this  encounter.    No Follow-up on file.

## 2014-06-11 LAB — PATHOLOGY REPORT

## 2014-06-12 ENCOUNTER — Encounter: Payer: Self-pay | Admitting: Internal Medicine

## 2014-06-13 DIAGNOSIS — C50912 Malignant neoplasm of unspecified site of left female breast: Secondary | ICD-10-CM | POA: Insufficient documentation

## 2014-06-13 DIAGNOSIS — I872 Venous insufficiency (chronic) (peripheral): Secondary | ICD-10-CM | POA: Insufficient documentation

## 2014-06-13 LAB — CULTURE, BLOOD (SINGLE)

## 2014-06-13 NOTE — Assessment & Plan Note (Signed)
Encouraged to use compression garments daily.

## 2014-06-13 NOTE — Assessment & Plan Note (Signed)
Return for INR next week.

## 2014-06-13 NOTE — Assessment & Plan Note (Signed)
Last inr was low to due to temporary cessation of warfarin for procedure.  She has resumed 6 mg daily and will recheck INR in 1 week Lab Results  Component Value Date   INR 2.1* 04/22/2014   INR 2.52* 04/15/2014   INR 2.4* 04/02/2014

## 2014-06-13 NOTE — Assessment & Plan Note (Signed)
S/p partial mastectomy last week by Rochel Brome.  Incision is healing well per exam today.  Treatment at [facility] pending results of path.

## 2014-06-16 DIAGNOSIS — Z79899 Other long term (current) drug therapy: Secondary | ICD-10-CM | POA: Diagnosis not present

## 2014-06-16 DIAGNOSIS — C50419 Malignant neoplasm of upper-outer quadrant of unspecified female breast: Secondary | ICD-10-CM | POA: Diagnosis not present

## 2014-06-16 DIAGNOSIS — Z7901 Long term (current) use of anticoagulants: Secondary | ICD-10-CM | POA: Diagnosis not present

## 2014-06-16 DIAGNOSIS — Z5181 Encounter for therapeutic drug level monitoring: Secondary | ICD-10-CM | POA: Diagnosis not present

## 2014-06-16 DIAGNOSIS — Z170 Estrogen receptor positive status [ER+]: Secondary | ICD-10-CM | POA: Diagnosis not present

## 2014-06-16 DIAGNOSIS — Z86718 Personal history of other venous thrombosis and embolism: Secondary | ICD-10-CM | POA: Diagnosis not present

## 2014-06-18 ENCOUNTER — Other Ambulatory Visit (INDEPENDENT_AMBULATORY_CARE_PROVIDER_SITE_OTHER): Payer: Medicare Other

## 2014-06-18 DIAGNOSIS — Z7901 Long term (current) use of anticoagulants: Secondary | ICD-10-CM

## 2014-06-18 LAB — PROTIME-INR
INR: 2.6 ratio — AB (ref 0.8–1.0)
Prothrombin Time: 28.1 s — ABNORMAL HIGH (ref 9.6–13.1)

## 2014-06-19 ENCOUNTER — Ambulatory Visit: Payer: Self-pay | Admitting: Oncology

## 2014-06-19 DIAGNOSIS — Z23 Encounter for immunization: Secondary | ICD-10-CM | POA: Diagnosis not present

## 2014-06-19 DIAGNOSIS — M329 Systemic lupus erythematosus, unspecified: Secondary | ICD-10-CM | POA: Diagnosis not present

## 2014-06-19 DIAGNOSIS — I272 Other secondary pulmonary hypertension: Secondary | ICD-10-CM | POA: Diagnosis not present

## 2014-06-19 DIAGNOSIS — Z79899 Other long term (current) drug therapy: Secondary | ICD-10-CM | POA: Diagnosis not present

## 2014-06-19 DIAGNOSIS — Z170 Estrogen receptor positive status [ER+]: Secondary | ICD-10-CM | POA: Diagnosis not present

## 2014-06-19 DIAGNOSIS — E039 Hypothyroidism, unspecified: Secondary | ICD-10-CM | POA: Diagnosis not present

## 2014-06-19 DIAGNOSIS — Z86718 Personal history of other venous thrombosis and embolism: Secondary | ICD-10-CM | POA: Diagnosis not present

## 2014-06-19 DIAGNOSIS — Z510 Encounter for antineoplastic radiation therapy: Secondary | ICD-10-CM | POA: Diagnosis not present

## 2014-06-19 DIAGNOSIS — I10 Essential (primary) hypertension: Secondary | ICD-10-CM | POA: Diagnosis not present

## 2014-06-19 DIAGNOSIS — C50912 Malignant neoplasm of unspecified site of left female breast: Secondary | ICD-10-CM | POA: Diagnosis not present

## 2014-06-24 DIAGNOSIS — X32XXXA Exposure to sunlight, initial encounter: Secondary | ICD-10-CM | POA: Diagnosis not present

## 2014-06-24 DIAGNOSIS — L570 Actinic keratosis: Secondary | ICD-10-CM | POA: Diagnosis not present

## 2014-06-24 DIAGNOSIS — L821 Other seborrheic keratosis: Secondary | ICD-10-CM | POA: Diagnosis not present

## 2014-06-24 DIAGNOSIS — Z85828 Personal history of other malignant neoplasm of skin: Secondary | ICD-10-CM | POA: Diagnosis not present

## 2014-06-25 DIAGNOSIS — Z79899 Other long term (current) drug therapy: Secondary | ICD-10-CM | POA: Diagnosis not present

## 2014-06-25 DIAGNOSIS — I2789 Other specified pulmonary heart diseases: Secondary | ICD-10-CM | POA: Diagnosis not present

## 2014-06-25 DIAGNOSIS — M3210 Systemic lupus erythematosus, organ or system involvement unspecified: Secondary | ICD-10-CM | POA: Diagnosis not present

## 2014-06-25 DIAGNOSIS — R091 Pleurisy: Secondary | ICD-10-CM | POA: Diagnosis not present

## 2014-06-25 DIAGNOSIS — R0602 Shortness of breath: Secondary | ICD-10-CM | POA: Diagnosis not present

## 2014-06-26 DIAGNOSIS — C50919 Malignant neoplasm of unspecified site of unspecified female breast: Secondary | ICD-10-CM | POA: Diagnosis not present

## 2014-06-29 ENCOUNTER — Telehealth: Payer: Self-pay | Admitting: Internal Medicine

## 2014-06-29 DIAGNOSIS — C50412 Malignant neoplasm of upper-outer quadrant of left female breast: Secondary | ICD-10-CM

## 2014-07-01 ENCOUNTER — Other Ambulatory Visit: Payer: Self-pay | Admitting: *Deleted

## 2014-07-01 MED ORDER — WARFARIN SODIUM 5 MG PO TABS: 5.0000 mg | ORAL_TABLET | Freq: Every day | ORAL | Status: DC

## 2014-07-02 DIAGNOSIS — Z170 Estrogen receptor positive status [ER+]: Secondary | ICD-10-CM | POA: Diagnosis not present

## 2014-07-02 DIAGNOSIS — C50412 Malignant neoplasm of upper-outer quadrant of left female breast: Secondary | ICD-10-CM | POA: Diagnosis not present

## 2014-07-02 DIAGNOSIS — Z79899 Other long term (current) drug therapy: Secondary | ICD-10-CM | POA: Diagnosis not present

## 2014-07-02 DIAGNOSIS — C50912 Malignant neoplasm of unspecified site of left female breast: Secondary | ICD-10-CM | POA: Diagnosis not present

## 2014-07-07 DIAGNOSIS — Q662 Congenital metatarsus (primus) varus: Secondary | ICD-10-CM | POA: Diagnosis not present

## 2014-07-07 DIAGNOSIS — M79672 Pain in left foot: Secondary | ICD-10-CM | POA: Diagnosis not present

## 2014-07-07 DIAGNOSIS — M2040 Other hammer toe(s) (acquired), unspecified foot: Secondary | ICD-10-CM | POA: Diagnosis not present

## 2014-07-07 DIAGNOSIS — M21969 Unspecified acquired deformity of unspecified lower leg: Secondary | ICD-10-CM | POA: Diagnosis not present

## 2014-07-08 DIAGNOSIS — C50412 Malignant neoplasm of upper-outer quadrant of left female breast: Secondary | ICD-10-CM | POA: Diagnosis not present

## 2014-07-11 DIAGNOSIS — C50412 Malignant neoplasm of upper-outer quadrant of left female breast: Secondary | ICD-10-CM | POA: Diagnosis not present

## 2014-07-15 DIAGNOSIS — C50412 Malignant neoplasm of upper-outer quadrant of left female breast: Secondary | ICD-10-CM | POA: Diagnosis not present

## 2014-07-16 DIAGNOSIS — C50412 Malignant neoplasm of upper-outer quadrant of left female breast: Secondary | ICD-10-CM | POA: Diagnosis not present

## 2014-07-18 DIAGNOSIS — M1711 Unilateral primary osteoarthritis, right knee: Secondary | ICD-10-CM | POA: Diagnosis not present

## 2014-07-20 ENCOUNTER — Ambulatory Visit: Payer: Self-pay | Admitting: Oncology

## 2014-07-20 DIAGNOSIS — Z170 Estrogen receptor positive status [ER+]: Secondary | ICD-10-CM | POA: Diagnosis not present

## 2014-07-20 DIAGNOSIS — Z510 Encounter for antineoplastic radiation therapy: Secondary | ICD-10-CM | POA: Diagnosis not present

## 2014-07-20 DIAGNOSIS — C50912 Malignant neoplasm of unspecified site of left female breast: Secondary | ICD-10-CM | POA: Diagnosis not present

## 2014-07-21 DIAGNOSIS — G5762 Lesion of plantar nerve, left lower limb: Secondary | ICD-10-CM | POA: Diagnosis not present

## 2014-07-22 DIAGNOSIS — C50412 Malignant neoplasm of upper-outer quadrant of left female breast: Secondary | ICD-10-CM | POA: Diagnosis not present

## 2014-07-22 LAB — CBC CANCER CENTER
BASOS ABS: 0.1 x10 3/mm (ref 0.0–0.1)
Basophil %: 0.8 %
EOS ABS: 0.1 x10 3/mm (ref 0.0–0.7)
EOS PCT: 2 %
HCT: 39 % (ref 35.0–47.0)
HGB: 12.1 g/dL (ref 12.0–16.0)
LYMPHS ABS: 1.2 x10 3/mm (ref 1.0–3.6)
Lymphocyte %: 18.9 %
MCH: 28.1 pg (ref 26.0–34.0)
MCHC: 30.9 g/dL — AB (ref 32.0–36.0)
MCV: 91 fL (ref 80–100)
MONOS PCT: 9 %
Monocyte #: 0.6 x10 3/mm (ref 0.2–0.9)
NEUTROS ABS: 4.5 x10 3/mm (ref 1.4–6.5)
Neutrophil %: 69.3 %
Platelet: 248 x10 3/mm (ref 150–440)
RBC: 4.29 10*6/uL (ref 3.80–5.20)
RDW: 15.8 % — ABNORMAL HIGH (ref 11.5–14.5)
WBC: 6.5 x10 3/mm (ref 3.6–11.0)

## 2014-07-28 DIAGNOSIS — C50412 Malignant neoplasm of upper-outer quadrant of left female breast: Secondary | ICD-10-CM | POA: Diagnosis not present

## 2014-07-29 DIAGNOSIS — C50412 Malignant neoplasm of upper-outer quadrant of left female breast: Secondary | ICD-10-CM | POA: Diagnosis not present

## 2014-07-29 LAB — CBC CANCER CENTER
Basophil #: 0.1 x10 3/mm (ref 0.0–0.1)
Basophil %: 1.1 %
Eosinophil #: 0 x10 3/mm (ref 0.0–0.7)
Eosinophil %: 0.5 %
HCT: 37 % (ref 35.0–47.0)
HGB: 11.7 g/dL — AB (ref 12.0–16.0)
LYMPHS PCT: 7.9 %
Lymphocyte #: 0.5 x10 3/mm — ABNORMAL LOW (ref 1.0–3.6)
MCH: 28.7 pg (ref 26.0–34.0)
MCHC: 31.7 g/dL — AB (ref 32.0–36.0)
MCV: 91 fL (ref 80–100)
MONO ABS: 0.3 x10 3/mm (ref 0.2–0.9)
MONOS PCT: 5.2 %
NEUTROS ABS: 5.7 x10 3/mm (ref 1.4–6.5)
NEUTROS PCT: 85.3 %
Platelet: 225 x10 3/mm (ref 150–440)
RBC: 4.08 10*6/uL (ref 3.80–5.20)
RDW: 15.9 % — AB (ref 11.5–14.5)
WBC: 6.6 x10 3/mm (ref 3.6–11.0)

## 2014-07-31 ENCOUNTER — Other Ambulatory Visit (INDEPENDENT_AMBULATORY_CARE_PROVIDER_SITE_OTHER): Payer: Medicare Other

## 2014-07-31 DIAGNOSIS — Z7901 Long term (current) use of anticoagulants: Secondary | ICD-10-CM | POA: Diagnosis not present

## 2014-07-31 LAB — PROTIME-INR
INR: 1.3 ratio — ABNORMAL HIGH (ref 0.8–1.0)
Prothrombin Time: 14.6 s — ABNORMAL HIGH (ref 9.6–13.1)

## 2014-08-05 DIAGNOSIS — G5762 Lesion of plantar nerve, left lower limb: Secondary | ICD-10-CM | POA: Diagnosis not present

## 2014-08-05 DIAGNOSIS — C50412 Malignant neoplasm of upper-outer quadrant of left female breast: Secondary | ICD-10-CM | POA: Diagnosis not present

## 2014-08-05 LAB — CBC CANCER CENTER
BASOS ABS: 0.1 x10 3/mm (ref 0.0–0.1)
BASOS PCT: 1.1 %
EOS ABS: 0.1 x10 3/mm (ref 0.0–0.7)
Eosinophil %: 1.7 %
HCT: 37.4 % (ref 35.0–47.0)
HGB: 11.6 g/dL — ABNORMAL LOW (ref 12.0–16.0)
LYMPHS ABS: 0.5 x10 3/mm — AB (ref 1.0–3.6)
Lymphocyte %: 11.4 %
MCH: 28.5 pg (ref 26.0–34.0)
MCHC: 31 g/dL — AB (ref 32.0–36.0)
MCV: 92 fL (ref 80–100)
MONO ABS: 0.3 x10 3/mm (ref 0.2–0.9)
MONOS PCT: 7.3 %
Neutrophil #: 3.5 x10 3/mm (ref 1.4–6.5)
Neutrophil %: 78.5 %
Platelet: 233 x10 3/mm (ref 150–440)
RBC: 4.07 10*6/uL (ref 3.80–5.20)
RDW: 16.6 % — ABNORMAL HIGH (ref 11.5–14.5)
WBC: 4.5 x10 3/mm (ref 3.6–11.0)

## 2014-08-07 ENCOUNTER — Other Ambulatory Visit: Payer: Self-pay | Admitting: Internal Medicine

## 2014-08-11 DIAGNOSIS — C50412 Malignant neoplasm of upper-outer quadrant of left female breast: Secondary | ICD-10-CM | POA: Diagnosis not present

## 2014-08-12 DIAGNOSIS — R609 Edema, unspecified: Secondary | ICD-10-CM | POA: Diagnosis not present

## 2014-08-12 DIAGNOSIS — I270 Primary pulmonary hypertension: Secondary | ICD-10-CM | POA: Diagnosis not present

## 2014-08-12 DIAGNOSIS — E539 Vitamin B deficiency, unspecified: Secondary | ICD-10-CM | POA: Diagnosis not present

## 2014-08-12 DIAGNOSIS — Z87891 Personal history of nicotine dependence: Secondary | ICD-10-CM | POA: Diagnosis not present

## 2014-08-12 DIAGNOSIS — I5032 Chronic diastolic (congestive) heart failure: Secondary | ICD-10-CM | POA: Diagnosis not present

## 2014-08-12 LAB — CBC CANCER CENTER
Basophil #: 0 x10 3/mm (ref 0.0–0.1)
Basophil %: 0.9 %
EOS PCT: 1.2 %
Eosinophil #: 0.1 x10 3/mm (ref 0.0–0.7)
HCT: 37.4 % (ref 35.0–47.0)
HGB: 11.5 g/dL — AB (ref 12.0–16.0)
LYMPHS ABS: 0.5 x10 3/mm — AB (ref 1.0–3.6)
Lymphocyte %: 10.1 %
MCH: 28.2 pg (ref 26.0–34.0)
MCHC: 30.7 g/dL — ABNORMAL LOW (ref 32.0–36.0)
MCV: 92 fL (ref 80–100)
MONO ABS: 0.3 x10 3/mm (ref 0.2–0.9)
Monocyte %: 6.2 %
Neutrophil #: 3.9 x10 3/mm (ref 1.4–6.5)
Neutrophil %: 81.6 %
PLATELETS: 230 x10 3/mm (ref 150–440)
RBC: 4.09 10*6/uL (ref 3.80–5.20)
RDW: 16.8 % — AB (ref 11.5–14.5)
WBC: 4.8 x10 3/mm (ref 3.6–11.0)

## 2014-08-17 ENCOUNTER — Other Ambulatory Visit: Payer: Self-pay | Admitting: Internal Medicine

## 2014-08-19 ENCOUNTER — Ambulatory Visit: Payer: Self-pay | Admitting: Oncology

## 2014-08-19 DIAGNOSIS — Z170 Estrogen receptor positive status [ER+]: Secondary | ICD-10-CM | POA: Diagnosis not present

## 2014-08-19 DIAGNOSIS — C50412 Malignant neoplasm of upper-outer quadrant of left female breast: Secondary | ICD-10-CM | POA: Diagnosis not present

## 2014-08-19 DIAGNOSIS — C50912 Malignant neoplasm of unspecified site of left female breast: Secondary | ICD-10-CM | POA: Diagnosis not present

## 2014-08-19 DIAGNOSIS — Z510 Encounter for antineoplastic radiation therapy: Secondary | ICD-10-CM | POA: Diagnosis not present

## 2014-08-19 DIAGNOSIS — G5762 Lesion of plantar nerve, left lower limb: Secondary | ICD-10-CM | POA: Diagnosis not present

## 2014-08-25 DIAGNOSIS — I270 Primary pulmonary hypertension: Secondary | ICD-10-CM | POA: Diagnosis not present

## 2014-08-25 DIAGNOSIS — I5189 Other ill-defined heart diseases: Secondary | ICD-10-CM | POA: Diagnosis not present

## 2014-08-25 DIAGNOSIS — I517 Cardiomegaly: Secondary | ICD-10-CM | POA: Diagnosis not present

## 2014-09-02 DIAGNOSIS — G5762 Lesion of plantar nerve, left lower limb: Secondary | ICD-10-CM | POA: Diagnosis not present

## 2014-09-05 ENCOUNTER — Other Ambulatory Visit (INDEPENDENT_AMBULATORY_CARE_PROVIDER_SITE_OTHER): Payer: Medicare Other

## 2014-09-05 DIAGNOSIS — Z7901 Long term (current) use of anticoagulants: Secondary | ICD-10-CM

## 2014-09-05 LAB — PROTIME-INR
INR: 4 ratio — AB (ref 0.8–1.0)
Prothrombin Time: 42.5 s — ABNORMAL HIGH (ref 9.6–13.1)

## 2014-09-08 MED ORDER — WARFARIN SODIUM 5 MG PO TABS: 5.0000 mg | ORAL_TABLET | Freq: Every day | ORAL | Status: DC

## 2014-09-08 NOTE — Assessment & Plan Note (Addendum)
INR is 4.0 on 6 mg daily  Dose held x 2 days,  Dose reduced to 5 mg daily ,  INR one week  Lab Results  Component Value Date   INR 4.0* 09/05/2014   INR 1.3* 07/31/2014   INR 2.6* 06/18/2014

## 2014-09-08 NOTE — Addendum Note (Signed)
Addended by: Crecencio Mc on: 09/08/2014 12:54 PM   Modules accepted: Orders

## 2014-09-15 ENCOUNTER — Other Ambulatory Visit (INDEPENDENT_AMBULATORY_CARE_PROVIDER_SITE_OTHER): Payer: Medicare Other

## 2014-09-15 DIAGNOSIS — Z7901 Long term (current) use of anticoagulants: Secondary | ICD-10-CM

## 2014-09-15 LAB — PROTIME-INR
INR: 2 ratio — AB (ref 0.8–1.0)
PROTHROMBIN TIME: 21.8 s — AB (ref 9.6–13.1)

## 2014-09-19 ENCOUNTER — Ambulatory Visit: Payer: Self-pay | Admitting: Oncology

## 2014-09-19 DIAGNOSIS — M329 Systemic lupus erythematosus, unspecified: Secondary | ICD-10-CM | POA: Diagnosis not present

## 2014-09-19 DIAGNOSIS — Z79899 Other long term (current) drug therapy: Secondary | ICD-10-CM | POA: Diagnosis not present

## 2014-09-19 DIAGNOSIS — Z923 Personal history of irradiation: Secondary | ICD-10-CM | POA: Diagnosis not present

## 2014-09-19 DIAGNOSIS — Z86718 Personal history of other venous thrombosis and embolism: Secondary | ICD-10-CM | POA: Diagnosis not present

## 2014-09-19 DIAGNOSIS — C50912 Malignant neoplasm of unspecified site of left female breast: Secondary | ICD-10-CM | POA: Diagnosis not present

## 2014-09-19 DIAGNOSIS — Z79811 Long term (current) use of aromatase inhibitors: Secondary | ICD-10-CM | POA: Diagnosis not present

## 2014-09-19 DIAGNOSIS — Z170 Estrogen receptor positive status [ER+]: Secondary | ICD-10-CM | POA: Diagnosis not present

## 2014-09-19 DIAGNOSIS — I10 Essential (primary) hypertension: Secondary | ICD-10-CM | POA: Diagnosis not present

## 2014-09-19 DIAGNOSIS — Z87891 Personal history of nicotine dependence: Secondary | ICD-10-CM | POA: Diagnosis not present

## 2014-09-19 DIAGNOSIS — E039 Hypothyroidism, unspecified: Secondary | ICD-10-CM | POA: Diagnosis not present

## 2014-09-24 DIAGNOSIS — Z853 Personal history of malignant neoplasm of breast: Secondary | ICD-10-CM | POA: Diagnosis not present

## 2014-09-25 DIAGNOSIS — M3214 Glomerular disease in systemic lupus erythematosus: Secondary | ICD-10-CM | POA: Diagnosis not present

## 2014-09-25 DIAGNOSIS — I509 Heart failure, unspecified: Secondary | ICD-10-CM | POA: Diagnosis not present

## 2014-10-02 DIAGNOSIS — Z170 Estrogen receptor positive status [ER+]: Secondary | ICD-10-CM | POA: Diagnosis not present

## 2014-10-02 DIAGNOSIS — Z79811 Long term (current) use of aromatase inhibitors: Secondary | ICD-10-CM | POA: Diagnosis not present

## 2014-10-02 DIAGNOSIS — Z923 Personal history of irradiation: Secondary | ICD-10-CM | POA: Diagnosis not present

## 2014-10-02 DIAGNOSIS — I10 Essential (primary) hypertension: Secondary | ICD-10-CM | POA: Diagnosis not present

## 2014-10-02 DIAGNOSIS — C50912 Malignant neoplasm of unspecified site of left female breast: Secondary | ICD-10-CM | POA: Diagnosis not present

## 2014-10-02 DIAGNOSIS — Z79899 Other long term (current) drug therapy: Secondary | ICD-10-CM | POA: Diagnosis not present

## 2014-10-02 LAB — CBC CANCER CENTER
BASOS ABS: 0.1 x10 3/mm (ref 0.0–0.1)
BASOS PCT: 1.3 %
Eosinophil #: 0.1 x10 3/mm (ref 0.0–0.7)
Eosinophil %: 2.6 %
HCT: 37.5 % (ref 35.0–47.0)
HGB: 12 g/dL (ref 12.0–16.0)
LYMPHS ABS: 1 x10 3/mm (ref 1.0–3.6)
Lymphocyte %: 24.2 %
MCH: 28.9 pg (ref 26.0–34.0)
MCHC: 32.1 g/dL (ref 32.0–36.0)
MCV: 90 fL (ref 80–100)
MONO ABS: 0.5 x10 3/mm (ref 0.2–0.9)
Monocyte %: 12 %
NEUTROS ABS: 2.5 x10 3/mm (ref 1.4–6.5)
Neutrophil %: 59.9 %
PLATELETS: 258 x10 3/mm (ref 150–440)
RBC: 4.16 10*6/uL (ref 3.80–5.20)
RDW: 16 % — AB (ref 11.5–14.5)
WBC: 4.2 x10 3/mm (ref 3.6–11.0)

## 2014-10-02 LAB — CBC AND DIFFERENTIAL
HCT: 36 % (ref 36–46)
Hemoglobin: 12 g/dL (ref 12.0–16.0)
Platelets: 258 10*3/uL (ref 150–399)
WBC: 4.2 10^3/mL

## 2014-10-02 LAB — COMPREHENSIVE METABOLIC PANEL
ALBUMIN: 3.7 g/dL (ref 3.4–5.0)
ALT: 19 U/L
ANION GAP: 12 (ref 7–16)
AST: 18 U/L (ref 15–37)
Alkaline Phosphatase: 60 U/L
BUN: 19 mg/dL — ABNORMAL HIGH (ref 7–18)
Bilirubin,Total: 0.2 mg/dL (ref 0.2–1.0)
CALCIUM: 8.8 mg/dL (ref 8.5–10.1)
CHLORIDE: 107 mmol/L (ref 98–107)
Co2: 27 mmol/L (ref 21–32)
Creatinine: 1.31 mg/dL — ABNORMAL HIGH (ref 0.60–1.30)
EGFR (African American): 51 — ABNORMAL LOW
EGFR (Non-African Amer.): 42 — ABNORMAL LOW
Glucose: 110 mg/dL — ABNORMAL HIGH (ref 65–99)
Osmolality: 293 (ref 275–301)
Potassium: 3.9 mmol/L (ref 3.5–5.1)
Sodium: 146 mmol/L — ABNORMAL HIGH (ref 136–145)
Total Protein: 7 g/dL (ref 6.4–8.2)

## 2014-10-13 DIAGNOSIS — R0602 Shortness of breath: Secondary | ICD-10-CM | POA: Diagnosis not present

## 2014-10-13 DIAGNOSIS — M329 Systemic lupus erythematosus, unspecified: Secondary | ICD-10-CM | POA: Diagnosis not present

## 2014-10-16 ENCOUNTER — Ambulatory Visit: Payer: Self-pay | Admitting: Anesthesiology

## 2014-10-16 DIAGNOSIS — M545 Low back pain: Secondary | ICD-10-CM | POA: Diagnosis not present

## 2014-10-16 DIAGNOSIS — M329 Systemic lupus erythematosus, unspecified: Secondary | ICD-10-CM | POA: Diagnosis not present

## 2014-10-16 DIAGNOSIS — E309 Disorder of puberty, unspecified: Secondary | ICD-10-CM | POA: Diagnosis not present

## 2014-10-16 DIAGNOSIS — Z853 Personal history of malignant neoplasm of breast: Secondary | ICD-10-CM | POA: Diagnosis not present

## 2014-10-16 DIAGNOSIS — M25551 Pain in right hip: Secondary | ICD-10-CM | POA: Diagnosis not present

## 2014-10-16 DIAGNOSIS — I272 Other secondary pulmonary hypertension: Secondary | ICD-10-CM | POA: Diagnosis not present

## 2014-10-20 ENCOUNTER — Ambulatory Visit: Payer: Self-pay | Admitting: Oncology

## 2014-10-20 ENCOUNTER — Other Ambulatory Visit: Payer: Self-pay | Admitting: Internal Medicine

## 2014-10-21 DIAGNOSIS — R0602 Shortness of breath: Secondary | ICD-10-CM | POA: Diagnosis not present

## 2014-10-21 DIAGNOSIS — K449 Diaphragmatic hernia without obstruction or gangrene: Secondary | ICD-10-CM | POA: Diagnosis not present

## 2014-10-21 DIAGNOSIS — K3189 Other diseases of stomach and duodenum: Secondary | ICD-10-CM | POA: Diagnosis not present

## 2014-10-21 DIAGNOSIS — K5790 Diverticulosis of intestine, part unspecified, without perforation or abscess without bleeding: Secondary | ICD-10-CM | POA: Diagnosis not present

## 2014-10-21 DIAGNOSIS — N281 Cyst of kidney, acquired: Secondary | ICD-10-CM | POA: Diagnosis not present

## 2014-10-21 DIAGNOSIS — M329 Systemic lupus erythematosus, unspecified: Secondary | ICD-10-CM | POA: Diagnosis not present

## 2014-10-23 DIAGNOSIS — M3212 Pericarditis in systemic lupus erythematosus: Secondary | ICD-10-CM | POA: Diagnosis not present

## 2014-10-23 DIAGNOSIS — R0602 Shortness of breath: Secondary | ICD-10-CM | POA: Diagnosis not present

## 2014-10-23 DIAGNOSIS — R091 Pleurisy: Secondary | ICD-10-CM | POA: Diagnosis not present

## 2014-10-23 DIAGNOSIS — I2789 Other specified pulmonary heart diseases: Secondary | ICD-10-CM | POA: Diagnosis not present

## 2014-10-28 DIAGNOSIS — J449 Chronic obstructive pulmonary disease, unspecified: Secondary | ICD-10-CM | POA: Diagnosis not present

## 2014-10-28 DIAGNOSIS — I480 Paroxysmal atrial fibrillation: Secondary | ICD-10-CM | POA: Diagnosis not present

## 2014-10-28 DIAGNOSIS — R0689 Other abnormalities of breathing: Secondary | ICD-10-CM | POA: Diagnosis not present

## 2014-10-28 DIAGNOSIS — C50919 Malignant neoplasm of unspecified site of unspecified female breast: Secondary | ICD-10-CM | POA: Diagnosis not present

## 2014-10-28 DIAGNOSIS — I519 Heart disease, unspecified: Secondary | ICD-10-CM | POA: Diagnosis not present

## 2014-10-28 DIAGNOSIS — I509 Heart failure, unspecified: Secondary | ICD-10-CM | POA: Diagnosis not present

## 2014-10-28 DIAGNOSIS — Z87891 Personal history of nicotine dependence: Secondary | ICD-10-CM | POA: Diagnosis not present

## 2014-10-28 DIAGNOSIS — I272 Other secondary pulmonary hypertension: Secondary | ICD-10-CM | POA: Diagnosis not present

## 2014-10-28 DIAGNOSIS — M329 Systemic lupus erythematosus, unspecified: Secondary | ICD-10-CM | POA: Diagnosis not present

## 2014-10-28 DIAGNOSIS — R0600 Dyspnea, unspecified: Secondary | ICD-10-CM | POA: Diagnosis not present

## 2014-10-28 DIAGNOSIS — Z79899 Other long term (current) drug therapy: Secondary | ICD-10-CM | POA: Diagnosis not present

## 2014-10-28 DIAGNOSIS — E039 Hypothyroidism, unspecified: Secondary | ICD-10-CM | POA: Diagnosis not present

## 2014-10-28 DIAGNOSIS — Z882 Allergy status to sulfonamides status: Secondary | ICD-10-CM | POA: Diagnosis not present

## 2014-10-28 DIAGNOSIS — R600 Localized edema: Secondary | ICD-10-CM | POA: Diagnosis not present

## 2014-10-28 DIAGNOSIS — I270 Primary pulmonary hypertension: Secondary | ICD-10-CM | POA: Diagnosis not present

## 2014-11-05 ENCOUNTER — Telehealth: Payer: Self-pay | Admitting: Internal Medicine

## 2014-11-05 NOTE — Telephone Encounter (Signed)
I do not want to start antibiotics to treat upper respiratory infections reported by patients via phone for several reasons:  1) generally the symptoms are from a viral infection which will run its course with or without  antibiotics. 2) ue of antibiotics increases a patient's risk of a very serious antibiotic associated colitis called "C . dificile "    Flush your sinuses twice daily with Neil's Sinus Rise.  Continue  generic benadryl 25 mg every 8 hours for the post nasal drip Sudafed PE 10 to 30 mg every 6 hours for sinus and Eustachian tube congestion If the sudafed is not tolerated,  Use  Afrin nasal spray every 12 hours as needed for the congestion Not more than 5 days.   If you develop fevers,  Green discharge or sinus pain I will call in an antibiotic and a probiotic

## 2014-11-05 NOTE — Telephone Encounter (Signed)
Patient having Cough , dripping nose , drainage down back of throat with thick yellow mucus, Patient states when she bends over her head hurts really bad. Patient stated she feels so bad she cannot come in to see MD, she doesn't feel like getting dressed. Patient denies fever but stated she is having chills. Patient has tried benadryl 25 mg with no relief.

## 2014-11-05 NOTE — Telephone Encounter (Signed)
Patient notified and voiced understanding.

## 2014-11-10 ENCOUNTER — Ambulatory Visit (INDEPENDENT_AMBULATORY_CARE_PROVIDER_SITE_OTHER): Payer: Medicare Other | Admitting: Internal Medicine

## 2014-11-10 ENCOUNTER — Encounter: Payer: Self-pay | Admitting: Internal Medicine

## 2014-11-10 VITALS — BP 130/74 | HR 94 | Temp 98.4°F | Ht 64.0 in | Wt 157.1 lb

## 2014-11-10 DIAGNOSIS — Z8619 Personal history of other infectious and parasitic diseases: Secondary | ICD-10-CM | POA: Insufficient documentation

## 2014-11-10 DIAGNOSIS — Z7901 Long term (current) use of anticoagulants: Secondary | ICD-10-CM

## 2014-11-10 DIAGNOSIS — B029 Zoster without complications: Secondary | ICD-10-CM

## 2014-11-10 DIAGNOSIS — R0981 Nasal congestion: Secondary | ICD-10-CM | POA: Insufficient documentation

## 2014-11-10 MED ORDER — VALACYCLOVIR HCL 1 G PO TABS: 1000.0000 mg | ORAL_TABLET | Freq: Three times a day (TID) | ORAL | Status: DC

## 2014-11-10 MED ORDER — GENTAMICIN SULFATE 0.1 % EX OINT: 1.0000 "application " | TOPICAL_OINTMENT | Freq: Three times a day (TID) | CUTANEOUS | Status: DC

## 2014-11-10 NOTE — Assessment & Plan Note (Signed)
Will check INR with labs today.

## 2014-11-10 NOTE — Assessment & Plan Note (Signed)
Symptoms of mild sinus congestion. We discussed that this is likely viral. She is afebrile. Will continue prn Sudafed as she discussed with Dr. Derrel Nip. Recheck later this week. Discussed goal of avoiding antibiotics given she is on chronic anticoagulation and risk of CDiff.

## 2014-11-10 NOTE — Patient Instructions (Signed)
Start Valtrex 1gm three times daily.  Start topical Gentamicin small amount 2-3 times daily to prevent skin infection.  Follow up in 4 days.

## 2014-11-10 NOTE — Progress Notes (Signed)
Subjective:    Patient ID: Diana [name], female    DOB: 03-Nov-1934, 80 y.o.   MRN: [id number]  HPI 80YO female presents for acute visit.  Concerned she may have shingles. Had shingles 2 years ago in the same location. First noted blisters on Thursday and Friday. Started Acyclovir that she had at home. Some blistered are now crusted. No severe pain. Not taking anything for pain.  Also having some nasal congestion. Using Sudafed with minimal improvement. No fever, chills, cough. Mild maxillary sinus pressure.  Past medical, surgical, family and social history per today's encounter.  Review of Systems  Constitutional: Positive for fatigue. Negative for fever, chills and unexpected weight change.  HENT: Positive for congestion and sinus pressure. Negative for ear discharge, ear pain, facial swelling, hearing loss, mouth sores, nosebleeds, postnasal drip, rhinorrhea, sneezing, sore throat, tinnitus, trouble swallowing and voice change.   Eyes: Negative for pain, discharge, redness and visual disturbance.  Respiratory: Negative for cough, chest tightness, shortness of breath, wheezing and stridor.   Cardiovascular: Negative for chest pain, palpitations and leg swelling.  Gastrointestinal: Negative for diarrhea and constipation.  Musculoskeletal: Negative for myalgias, arthralgias, neck pain and neck stiffness.  Skin: Positive for color change, rash and wound.  Neurological: Negative for dizziness, weakness, light-headedness and headaches.  Hematological: Negative for adenopathy.       Objective:    BP 130/74 mmHg  Pulse 94  Temp(Src) 98.4 F (36.9 C) (Oral)  Ht 5\' 4"  (1.626 m)  Wt 157 lb 2 oz (71.271 kg)  BMI 26.96 kg/m2  SpO2 95% Physical Exam  Constitutional: She is oriented to person, place, and time. She appears well-developed and well-nourished. No distress.  HENT:  Head: Normocephalic and atraumatic.  Right Ear: External ear normal.  Left Ear: External ear normal.    Nose: Nose normal.  Mouth/Throat: Oropharynx is clear and moist. No oropharyngeal exudate.  Eyes: Conjunctivae are normal. Pupils are equal, round, and reactive to light. Right eye exhibits no discharge. Left eye exhibits no discharge. No scleral icterus.  Neck: Normal range of motion. Neck supple. No tracheal deviation present. No thyromegaly present.  Cardiovascular: Normal rate, regular rhythm, normal heart sounds and intact distal pulses.  Exam reveals no gallop and no friction rub.   No murmur heard. Pulmonary/Chest: Effort normal and breath sounds normal. No respiratory distress. She has no wheezes. She has no rales. She exhibits no tenderness.  Musculoskeletal: Normal range of motion. She exhibits no edema or tenderness.  Lymphadenopathy:    She has no cervical adenopathy.  Neurological: She is alert and oriented to person, place, and time. No cranial nerve deficit. She exhibits normal muscle tone. Coordination normal.  Skin: Skin is warm and dry. Rash noted. Rash is vesicular. She is not diaphoretic. No erythema. No pallor.     Psychiatric: She has a normal mood and affect. Her behavior is normal. Judgment and thought content normal.          Assessment & Plan:   Problem List Items Addressed This Visit      Unprioritized   Chronic anticoagulation    Will check INR with labs today.      Relevant Orders   Protime-INR   Herpes zoster - Primary    Symptoms and exam are consistent with recurrent herpes zoster. Will start Valtrex 1gm po tid x 7 days. Will start topical gentamicin to prevent secondary infection. No pain at present, so will monitor for changes. Follow up recheck in 2  days.      Relevant Medications   valACYclovir (VALTREX) tablet   gentamicin (GARAMYCIN) ointment 0.1%   Other Relevant Orders   Comprehensive metabolic panel   Sinus congestion    Symptoms of mild sinus congestion. We discussed that this is likely viral. She is afebrile. Will continue prn  Sudafed as she discussed with Dr. Darrick Huntsman. Recheck later this week. Discussed goal of avoiding antibiotics given she is on chronic anticoagulation and risk of CDiff.          Return in about 4 days (around 11/14/2014) for Recheck.

## 2014-11-10 NOTE — Progress Notes (Signed)
Pre visit review using our clinic review tool, if applicable. No additional management support is needed unless otherwise documented below in the visit note. 

## 2014-11-10 NOTE — Assessment & Plan Note (Signed)
Symptoms and exam are consistent with recurrent herpes zoster. Will start Valtrex 1gm po tid x 7 days. Will start topical gentamicin to prevent secondary infection. No pain at present, so will monitor for changes. Follow up recheck in 2 days.

## 2014-11-11 LAB — COMPREHENSIVE METABOLIC PANEL
ALBUMIN: 4 g/dL (ref 3.5–5.2)
ALT: 11 U/L (ref 0–35)
AST: 19 U/L (ref 0–37)
Alkaline Phosphatase: 54 U/L (ref 39–117)
BUN: 16 mg/dL (ref 6–23)
CHLORIDE: 107 meq/L (ref 96–112)
CO2: 25 meq/L (ref 19–32)
CREATININE: 1.18 mg/dL (ref 0.40–1.20)
Calcium: 9.5 mg/dL (ref 8.4–10.5)
GFR: 47.1 mL/min — ABNORMAL LOW (ref >60.00)
Glucose, Bld: 115 mg/dL — ABNORMAL HIGH (ref 70–99)
Potassium: 4.2 mEq/L (ref 3.5–5.1)
Sodium: 142 mEq/L (ref 135–145)
Total Bilirubin: 0.3 mg/dL (ref 0.2–1.2)
Total Protein: 6.8 g/dL (ref 6.0–8.3)

## 2014-11-11 LAB — PROTIME-INR
INR: 1.9 ratio — AB (ref 0.8–1.0)
PROTHROMBIN TIME: 20.8 s — AB (ref 9.6–13.1)

## 2014-11-13 ENCOUNTER — Ambulatory Visit (INDEPENDENT_AMBULATORY_CARE_PROVIDER_SITE_OTHER): Payer: Medicare Other | Admitting: Internal Medicine

## 2014-11-13 ENCOUNTER — Encounter: Payer: Self-pay | Admitting: Internal Medicine

## 2014-11-13 ENCOUNTER — Ambulatory Visit: Payer: Self-pay | Admitting: Internal Medicine

## 2014-11-13 VITALS — BP 127/76 | HR 102 | Temp 97.5°F | Ht 64.0 in | Wt 155.4 lb

## 2014-11-13 DIAGNOSIS — R0600 Dyspnea, unspecified: Secondary | ICD-10-CM | POA: Diagnosis not present

## 2014-11-13 DIAGNOSIS — B029 Zoster without complications: Secondary | ICD-10-CM | POA: Diagnosis not present

## 2014-11-13 DIAGNOSIS — R0602 Shortness of breath: Secondary | ICD-10-CM | POA: Diagnosis not present

## 2014-11-13 DIAGNOSIS — I270 Primary pulmonary hypertension: Secondary | ICD-10-CM

## 2014-11-13 DIAGNOSIS — I5033 Acute on chronic diastolic (congestive) heart failure: Secondary | ICD-10-CM | POA: Diagnosis not present

## 2014-11-13 DIAGNOSIS — Z7901 Long term (current) use of anticoagulants: Secondary | ICD-10-CM | POA: Diagnosis not present

## 2014-11-13 DIAGNOSIS — K449 Diaphragmatic hernia without obstruction or gangrene: Secondary | ICD-10-CM | POA: Diagnosis not present

## 2014-11-13 DIAGNOSIS — I2729 Other secondary pulmonary hypertension: Secondary | ICD-10-CM

## 2014-11-13 NOTE — Assessment & Plan Note (Signed)
Rash has improved in appearance. Will continue Valtrex for 7 day course and topical gentamicin to prevent secondary infection.

## 2014-11-13 NOTE — Assessment & Plan Note (Signed)
Recent INR 1.9. Will continue same dose of Coumadin given recent addition of Valtrex, and plan to recheck INR next week.

## 2014-11-13 NOTE — Assessment & Plan Note (Addendum)
Reviewed notes from [facility]. Some recent worsening of dyspnea, likely related to COPD and deconditioning. Continue Tadalafil and Furosemide.

## 2014-11-13 NOTE — Progress Notes (Signed)
Subjective:    Patient ID: Diana [name], female    DOB: 19-Jan-1935, 80 y.o.   MRN: [id number]  HPI 80YO female presents for follow up of herpes zoster.  Last seen 11/10/2014 with recurrent herpes zoster. Started on Valtrex. No pain at site. Has been applying topical Gentamicin to prevent secondary infection.  Concerned about worsening dyspnea. Has history of pulmonary hypertension, COPD and CHF. Recently seen by pulmonologist at [facility]. Found to have significant decline in PFTs compared to previous. No change made in medications including Furosemide and Tadalafil. Feels short of breath at rest over last several weeks. No fever, chills. No chest pain. Recently had some URI symptoms with congestion, dry cough. This has been improving.    Past medical, surgical, family and social history per today's encounter.  Review of Systems  Constitutional: Positive for fatigue. Negative for fever, chills and unexpected weight change.  HENT: Positive for congestion, postnasal drip and rhinorrhea. Negative for ear discharge, ear pain, facial swelling, hearing loss, mouth sores, nosebleeds, sinus pressure, sneezing, sore throat, tinnitus, trouble swallowing and voice change.   Eyes: Negative for pain, discharge, redness and visual disturbance.  Respiratory: Positive for cough and shortness of breath. Negative for chest tightness, wheezing and stridor.   Cardiovascular: Negative for chest pain, palpitations and leg swelling.  Musculoskeletal: Negative for myalgias, arthralgias, neck pain and neck stiffness.  Skin: Positive for rash and wound. Negative for color change.  Neurological: Negative for dizziness, weakness, light-headedness and headaches.  Hematological: Negative for adenopathy.       Objective:    BP 127/76 mmHg  Pulse 102  Temp(Src) 97.5 F (36.4 C) (Oral)  Ht 5\' 4"  (1.626 m)  Wt 155 lb 6 oz (70.478 kg)  BMI 26.66 kg/m2  SpO2 95% Physical Exam  Constitutional: She is oriented to  person, place, and time. She appears well-developed and well-nourished. No distress.  HENT:  Head: Normocephalic and atraumatic.  Right Ear: External ear normal.  Left Ear: External ear normal.  Nose: Nose normal.  Mouth/Throat: Oropharynx is clear and moist. No oropharyngeal exudate.  Eyes: Conjunctivae are normal. Pupils are equal, round, and reactive to light. Right eye exhibits no discharge. Left eye exhibits no discharge. No scleral icterus.  Neck: Normal range of motion. Neck supple. No tracheal deviation present. No thyromegaly present.  Cardiovascular: Normal rate, regular rhythm, normal heart sounds and intact distal pulses.  Exam reveals no gallop and no friction rub.   No murmur heard. Pulmonary/Chest: Effort normal. No accessory muscle usage. No tachypnea. No respiratory distress. She has decreased breath sounds in the right lower field and the left lower field. She has no wheezes. She has no rhonchi. She has rales in the right lower field. She exhibits no tenderness.  Musculoskeletal: Normal range of motion. She exhibits no edema or tenderness.  Lymphadenopathy:    She has no cervical adenopathy.  Neurological: She is alert and oriented to person, place, and time. No cranial nerve deficit. She exhibits normal muscle tone. Coordination normal.  Skin: Skin is warm and dry. Rash noted. Rash is vesicular. She is not diaphoretic. No erythema. No pallor.     Psychiatric: She has a normal mood and affect. Her behavior is normal. Judgment and thought content normal.          Assessment & Plan:   Problem List Items Addressed This Visit      Unprioritized   Chronic anticoagulation    Recent INR 1.9. Will continue same dose of  Coumadin given recent addition of Valtrex, and plan to recheck INR next week.      Diastolic dysfunction with heart failure    Appears euvolemic on exam. Will continue current medications. Wt Readings from Last 3 Encounters:  11/13/14 155 lb 6 oz (70.478  kg)  11/10/14 157 lb 2 oz (71.271 kg)  06/10/14 153 lb (69.4 kg)         Dyspnea    Several weeks of worsening dyspnea. Reviewed notes from pulmologist at [facility]. Recent PFTs showed progressive of disease. High res chest CT showed no evidence of interstitial lung disease. Dose of Tadalafil was maintained for PAH. Symbicort was added for COPD. Furosemide continued for CHF. No improvement over last 1-2 weeks. Will get follow up CXR today. Given recent URI, some risk of pneumonia, however exam is not consistent with this and pt has been afebrile.      Relevant Orders   DG Chest 2 View   Herpes zoster - Primary    Rash has improved in appearance. Will continue Valtrex for 7 day course and topical gentamicin to prevent secondary infection.       Hypertensive pulmonary venous disease    Reviewed notes from [facility]. Some recent worsening of dyspnea, likely related to COPD and deconditioning. Continue Tadalafil and Furosemide.          Return in about 1 week (around 11/20/2014) for Recheck.

## 2014-11-13 NOTE — Assessment & Plan Note (Signed)
Several weeks of worsening dyspnea. Reviewed notes from pulmologist at [facility]. Recent PFTs showed progressive of disease. High res chest CT showed no evidence of interstitial lung disease. Dose of Tadalafil was maintained for PAH. Symbicort was added for COPD. Furosemide continued for CHF. No improvement over last 1-2 weeks. Will get follow up CXR today. Given recent URI, some risk of pneumonia, however exam is not consistent with this and pt has been afebrile.

## 2014-11-13 NOTE — Assessment & Plan Note (Signed)
Appears euvolemic on exam. Will continue current medications. Wt Readings from Last 3 Encounters:  11/13/14 155 lb 6 oz (70.478 kg)  11/10/14 157 lb 2 oz (71.271 kg)  06/10/14 153 lb (69.4 kg)

## 2014-11-13 NOTE — Progress Notes (Signed)
Pre visit review using our clinic review tool, if applicable. No additional management support is needed unless otherwise documented below in the visit note. 

## 2014-11-13 NOTE — Patient Instructions (Signed)
Please return to clinic for INR check next week.  Follow up with Dr. Derrel Nip in 1-2 weeks.

## 2014-11-19 ENCOUNTER — Telehealth: Payer: Self-pay | Admitting: Internal Medicine

## 2014-11-19 ENCOUNTER — Ambulatory Visit (INDEPENDENT_AMBULATORY_CARE_PROVIDER_SITE_OTHER): Payer: Medicare Other | Admitting: Internal Medicine

## 2014-11-19 ENCOUNTER — Encounter: Payer: Self-pay | Admitting: Internal Medicine

## 2014-11-19 VITALS — BP 120/68 | HR 84 | Temp 97.4°F | Resp 16 | Ht 64.0 in | Wt 156.0 lb

## 2014-11-19 DIAGNOSIS — Z7901 Long term (current) use of anticoagulants: Secondary | ICD-10-CM

## 2014-11-19 DIAGNOSIS — B029 Zoster without complications: Secondary | ICD-10-CM | POA: Diagnosis not present

## 2014-11-19 DIAGNOSIS — E034 Atrophy of thyroid (acquired): Secondary | ICD-10-CM

## 2014-11-19 DIAGNOSIS — E119 Type 2 diabetes mellitus without complications: Secondary | ICD-10-CM | POA: Diagnosis not present

## 2014-11-19 DIAGNOSIS — N289 Disorder of kidney and ureter, unspecified: Secondary | ICD-10-CM

## 2014-11-19 DIAGNOSIS — R0600 Dyspnea, unspecified: Secondary | ICD-10-CM

## 2014-11-19 DIAGNOSIS — C50412 Malignant neoplasm of upper-outer quadrant of left female breast: Secondary | ICD-10-CM

## 2014-11-19 DIAGNOSIS — E038 Other specified hypothyroidism: Secondary | ICD-10-CM

## 2014-11-19 LAB — BASIC METABOLIC PANEL
BUN: 22 mg/dL (ref 6–23)
CO2: 23 mEq/L (ref 19–32)
Calcium: 9.6 mg/dL (ref 8.4–10.5)
Chloride: 108 mEq/L (ref 96–112)
Creatinine, Ser: 1.32 mg/dL — ABNORMAL HIGH (ref 0.40–1.20)
GFR: 41.38 mL/min — AB (ref >60.00)
Glucose, Bld: 58 mg/dL — ABNORMAL LOW (ref 70–99)
Potassium: 3.8 mEq/L (ref 3.5–5.1)
SODIUM: 142 meq/L (ref 135–145)

## 2014-11-19 LAB — PROTIME-INR
INR: 1.9 ratio — ABNORMAL HIGH (ref 0.8–1.0)
Prothrombin Time: 20.9 s — ABNORMAL HIGH (ref 9.6–13.1)

## 2014-11-19 LAB — HEMOGLOBIN A1C: HEMOGLOBIN A1C: 7.1 % — AB (ref 4.6–6.5)

## 2014-11-19 MED ORDER — RANITIDINE HCL 150 MG PO TABS: 150.0000 mg | ORAL_TABLET | Freq: Every day | ORAL | Status: DC

## 2014-11-19 MED ORDER — VALACYCLOVIR HCL 1 G PO TABS: 1000.0000 mg | ORAL_TABLET | Freq: Three times a day (TID) | ORAL | Status: DC

## 2014-11-19 NOTE — Progress Notes (Signed)
Pre-visit discussion using our clinic review tool. No additional management support is needed unless otherwise documented below in the visit note.  

## 2014-11-19 NOTE — Patient Instructions (Addendum)
Your hiatal hernia may be contributing to your dyspnea.  Eating smaller meals and preventing GAS will help:  Beano with any meal containing vegetables  Gas x if the gas still is a problem  Your diabetes has not been checked since Apri l so I am checking an A1c today   I want you to lose 8 lbs over the next six months with a low glycemic index diet and regular exercise  This is  my version of a  "Low GI"  Diet:  It will still lower your blood sugars and allow you to lose 4 to 8  lbs  per month if you follow it carefully.  Your goal with exercise is a minimum of 30 minutes of aerobic exercise 5 days per week (Walking does not count once it becomes easy!)     All of the foods can be found at grocery stores and in bulk at [employer].  The Atkins protein bars and shakes are available in more varieties at [employer], [employer] and [place].     7 AM Breakfast:  Choose from the following:  Low carbohydrate Protein  Shakes (I recommend the EAS AdvantEdge "Carb Control" shakes  Or the low carb shakes by Atkins.    2.5 carbs   Arnold's "Sandwhich Thin"toasted  w/ peanut butter (no jelly: about 20 net carbs  "Bagel Thin" with cream cheese and salmon: about 20 carbs   a scrambled egg/bacon/cheese burrito made with Mission's "carb balance" whole wheat tortilla  (about 10 net carbs )  A slice of home made fritatta (egg based dish without a crust:  google it)    Avoid cereal and bananas, oatmeal and cream of wheat and grits. They are loaded with carbohydrates!   10 AM: high protein snack  Protein bar by Atkins (the snack size, under 200 cal, usually < 6 net carbs).    A stick of cheese:  Around 1 carb,  100 cal     Dannon Light n Fit [place] Yogurt  (80 cal, 8 carbs)  Other so called "protein bars" and Greek yogurts tend to be loaded with carbohydrates.  Remember, in food advertising, the word "energy" is synonymous for " carbohydrate."  Lunch:   A Sandwich using the bread choices listed, Can use any   Eggs,  lunchmeat, grilled meat or canned tuna), avocado, regular mayo/mustard  and cheese.  A Salad using blue cheese, ranch,  Goddess or vinagrette,  No croutons or "confetti" and no "candied nuts" but regular nuts OK.   No pretzels or chips.  Pickles and miniature sweet peppers are a good low carb alternative that provide a "crunch"  The bread is the only source of carbohydrate in a sandwich and  can be decreased by trying some of these alternatives to traditional loaf bread  Joseph's makes a pita bread and a flat bread that are 50 cal and 4 net carbs available at [place] and [place].  This can be toasted to use with hummous as well  Toufayan makes a low carb flatbread that's 100 cal and 9 net carbs available at [employer] and [employer]'s makes 2 sizes of  Low carb whole wheat tortilla  (The large one is 210 cal and 6 net carbs)  Flat Out makes flatbreads that are low carb as well  Avoid "Low fat dressings, as well as [place] dressings They are loaded with sugar!   3 PM/ Mid day  Snack:  Consider  1  ounce of  almonds, walnuts, pistachios, pecans, peanuts,  Macadamia nuts or a nut medley.  Avoid "granola"; the dried cranberries and raisins are loaded with carbohydrates. Mixed nuts as long as there are no raisins,  cranberries or dried fruit.    Try the prosciutto/mozzarella cheese sticks by Fiorruci  In deli /backery section   High protein   To avoid overindulging in snacks: Try drinking a glass of unsweeted almond/coconut milk  Or a cup of coffee with your Atkins chocolate bar to keep you from having 3!!!   Pork rinds!  Yes Pork Rinds        6 PM  Dinner:     Meat/fowl/fish with a green salad, and either broccoli, cauliflower, green beans, spinach, brussel sprouts or  Lima beans. DO NOT BREAD THE PROTEIN!!      There is a low carb pasta by Dreamfield's that is acceptable and tastes great: only 5 digestible carbs/serving.( All grocery stores but BJs carry it )  Try Hurley Cisco  Angelo's chicken piccata or chicken or eggplant parm over low carb pasta.(Lowes and BJs)   Marjory Lies Sanchez's "Carnitas" (pulled pork, no sauce,  0 carbs) or his beef pot roast to make a dinner burrito (at BJ's)  Pesto over low carb pasta (bj's sells a good quality pesto in the center refrigerated section of the deli   Try satueeing  Cheral Marker with mushroooms  Whole wheat pasta is still full of digestible carbs and  Not as low in glycemic index as Dreamfield's.   Brown rice is still rice,  So skip the rice and noodles if you eat [place] or [place] (or at least limit to 1/2 cup)  9 PM snack :   Breyer's "low carb" fudgsicle or  ice cream bar (Carb Smart line), or  Weight Watcher's ice cream bar , or another "no sugar added" ice cream;  a serving of fresh berries/cherries with whipped cream   Cheese or DANNON'S LlGHT N FIT GREEK YOGURT or the [place] greek yogurt   8 ounces of [place] unsweetened almond/cococunut milk  Cheese and crackers (using WASA crackers,  They are low carb) or peanut butter on low carb crackers or pita bread     Avoid bananas, pineapple, grapes  and watermelon on a regular basis because they are high in sugar.  THINK OF THEM AS DESSERT  Remember that snack Substitutions should be less than 10 NET carbs per serving and meals should be < 25 net carbs. Remember that carbohydrates from fiber do not affect blood sugar, so you can  subtract fiber grams to get the "net carbs " of any particular food item.

## 2014-11-19 NOTE — Telephone Encounter (Signed)
Your diabetes is still under good control on current regimen, but your a1c has risen slightly compared to last time. your kidney function is a little     please try to limit the number of complex carbohydrates (starches, including white potatoes, Cereals, Cookies and cake) in your diet to as few as possible and make sure you are getting some exercise regularly   Please return in 1 week to repeat your creatinine.   Dr. Derrel Nip

## 2014-11-21 ENCOUNTER — Telehealth: Payer: Self-pay | Admitting: Internal Medicine

## 2014-11-21 ENCOUNTER — Other Ambulatory Visit: Payer: Self-pay | Admitting: Internal Medicine

## 2014-11-21 NOTE — Telephone Encounter (Signed)
Patient called for results of PT -INR. Please advise.

## 2014-11-21 NOTE — Telephone Encounter (Signed)
INr is therapeutic,  ,  But she needs a repeat BMETin one week bc her kidney function     is slightly off.

## 2014-11-21 NOTE — Telephone Encounter (Signed)
Spoke with pt, advised of result note.  Pt verbalized understanding

## 2014-11-22 NOTE — Assessment & Plan Note (Addendum)
  Managed with diet alone .Marland Kitchen Patient is up-to-date on eye exams and foot exam was done today.  She is overdue for annual check for  Proteinuria and fasting lipids   Lab Results  Component Value Date   HGBA1C 7.1* 11/19/2014   Lab Results  Component Value Date   MICROALBUR 0.9 10/11/2013    Lab Results  Component Value Date   CHOL 164 10/11/2013   HDL 69.70 10/11/2013   LDLCALC 64 10/11/2013   TRIG 152.0* 10/11/2013   CHOLHDL 2 10/11/2013

## 2014-11-22 NOTE — Assessment & Plan Note (Signed)
Secondary to pulmonary hypertension and large hiatal hernia .  No infiltrate by recent CT and chest x ray

## 2014-11-22 NOTE — Progress Notes (Signed)
Patient ID: Diana [name], female   DOB: Jun 07, 1935, 80 y.o.   MRN: [id number]   Patient Active Problem List   Diagnosis Date Noted  . Dyspnea 11/13/2014  . Herpes zoster 11/10/2014  . Chronic anticoagulation 11/10/2014  . Venous insufficiency of both lower extremities 06/13/2014  . Breast cancer of upper-outer quadrant of left female breast 05/26/2014  . Urinary incontinence 01/15/2014  . Bladder spasms 01/02/2014  . Other and unspecified hyperlipidemia 08/21/2013  . H/O recurrent pneumonia 05/26/2013  . Hypertensive pulmonary venous disease 04/23/2013  . Diastolic dysfunction with heart failure 04/23/2013  . Long term current use of anticoagulant therapy 03/27/2013  . DVT, recurrent, lower extremity, chronic 01/07/2013  . Hiatal hernia 01/06/2013  . Renal mass, left 01/06/2013  . Sciatica of right side 11/20/2012  . Post herpetic neuralgia 11/20/2012  . Disseminated zoster 10/31/2012  . Insomnia 11/08/2011  . LUPUS ERYTHEMATOSUS 06/13/2008  . NEUROPATHY-PERIPHERAL 10/06/2006  . Hypothyroidism 09/25/2006  . ANEMIA, B12 DEFICIENCY 09/25/2006  . GERD 09/25/2006  . Systemic Lupus Erythematosus 09/25/2006  . Diabetes mellitus without complication 09/25/2006  . SHINGLES, HX OF 09/25/2006  . TOBACCO ABUSE, HX OF 09/25/2006    Subjective:  CC:   Chief Complaint  Patient presents with  . Follow-up    from shingles nd to get INR checked and to listen to chest for crackles heard last week.    HPI:   Diana [name] is a 80 y.o. female who presents for 6 month follow up on multiple chronic conditions including Tye 2 DM, breast cancer, SLE with pulmonary hypertension, and histoyr of DVT on chronic anticoagulation.   She had a chest x ray done  At [facility] , ordered by Dr Dan Humphreys, but the results were apparently sent to Dr. Welton Flakes, not to our office,  and patient was not advised of the results.  She has been seen by [facility] recently for pulmonary hypertension and worsening pulmonary  function due to restrictive lung disease. She has dyspnea with ambulation.  And INR followup. INR was 1.9 last week and to be repeated today due to use of Valtrex.     She was treated by Dr Dan Humphreys last week for shingles involving the right  buttock and the rash has resolved with valtrex and topical gentamycin for prevention of cellulitis.  Her pain has resolved.   The results of her recent chest x ray was obtained online and it was negative for infiltrate.  Long discussion today about risks of using antibiotics when there is no objective evidence of bacterial infection.    Past Medical History  Diagnosis Date  . Lupus (systemic lupus erythematosus)   . Pulmonary hypertension   . Pneumonia   . Unspecified menopausal and postmenopausal disorder   . Primary pulmonary hypertension   . Acute glomerulonephritis with other specified pathological lesion in kidney in disease classified elsewhere(580.81)   . Unspecified hypothyroidism   . Shingles (herpes zoster) polyneuropathy March 2013    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    No past surgical history on file.     The following portions of the patient's history were reviewed and updated as appropriate: Allergies, current medications, and problem list.    Review of Systems:   Patient denies headache, fevers, malaise, unintentional weight loss, skin rash, eye pain, sinus congestion and sinus pain, sore throat, dysphagia,  hemoptysis , cough, dyspnea, wheezing, chest pain, palpitations, orthopnea, edema, abdominal pain, nausea, melena, diarrhea, constipation, flank pain, dysuria, hematuria, urinary  Frequency, nocturia, numbness, tingling,  seizures,  Focal weakness, Loss of consciousness,  Tremor, insomnia, depression, anxiety, and suicidal ideation.     History   Social History  . Marital Status: Divorced    Spouse Name: N/A  . Number of Children: N/A  . Years of Education: N/A   Occupational History  . 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 Smoking status: Former Smoker    Quit date: 05/25/1991  . Smokeless tobacco: Never Used  . Alcohol Use: Yes     Comment: rare  . Drug Use: No  . Sexual Activity: Not on file   Other Topics Concern  . Not on file   Social History Narrative    Objective:  Filed Vitals:   11/19/14 0912  BP: 120/68  Pulse: 84  Temp: 97.4 F (36.3 C)  Resp: 16     General appearance: alert, cooperative and appears stated age Ears: normal TM's and external ear canals both ears Throat: lips, mucosa, and tongue normal; teeth and gums normal Neck: no adenopathy, no carotid bruit, supple, symmetrical, trachea midline and thyroid not enlarged, symmetric, no tenderness/mass/nodules Back: symmetric, no curvature. ROM normal. No CVA tenderness. Lungs: clear to auscultation bilaterally Heart: regular rate and rhythm, S1, S2 normal, no murmur, click, rub or gallop Abdomen: soft, non-tender; bowel sounds normal; no masses,  no organomegaly Pulses: 2+ and symmetric Skin: Skin color, texture, turgor normal. No rashes or lesions Lymph nodes: Cervical, supraclavicular, and axillary nodes normal.  Assessment and Plan:  Diabetes mellitus without complication   Managed with diet alone .Marland Kitchen Patient is up-to-date on eye exams and foot exam was done today.  She is overdue for annual check for  Proteinuria and fasting lipids   Lab Results  Component Value Date   HGBA1C 7.1* 11/19/2014   Lab Results  Component Value Date   MICROALBUR 0.9 10/11/2013    Lab Results  Component Value Date   CHOL 164 10/11/2013   HDL 69.70 10/11/2013   LDLCALC 64 10/11/2013   TRIG 152.0* 10/11/2013   CHOLHDL 2 10/11/2013           Chronic anticoagulation Coumadin level is therapeutic,  Advised to Continue current regimen and repeat PT/INR in one month   Breast cancer of upper-outer quadrant of left female breast She is s/p partial mastectomy,  XRT  complted Dec 2015,  And started aromotase inhibitor with letrazole in Dec 2015. DEXA was ordered but not done.   Hypothyroidism Thyroid function  Is due for reassessment.   No current changes needed.   Lab Results  Component Value Date   TSH 3.24 10/11/2013      Dyspnea Secondary to pulmonary hypertension and large hiatal hernia .  No infiltrate by recent CT and chest x ray.  Reassurance provided that she does NOT have pneumonia.    A total of 40 minutes of face to face time was spent with patient more than half of which was spent in counselling and coordination of care    Updated Medication List Outpatient Encounter Prescriptions as of 11/19/2014  Medication Sig  . ALPRAZolam (XANAX) 0.5 MG tablet Take 1 tablet (0.5 mg total) by mouth at bedtime as needed for anxiety or sleep.  . cyanocobalamin (,VITAMIN B-12,) 1000 MCG/ML injection Inject 1 mL (1,000 mcg total) into the muscle every 30 (thirty) days.  . fluticasone (FLONASE) 50 MCG/ACT nasal spray USE 2 SPRAYS IN EACH NOSTRIL DAILY  . furosemide (LASIX) 20  MG tablet Take 20 mg by mouth daily.  Marland Kitchen gentamicin ointment (GARAMYCIN) 0.1 % Apply 1 application topically 3 (three) times daily.  Marland Kitchen HYDROcodone-acetaminophen (NORCO/VICODIN) 5-325 MG per tablet Take 1 tablet by mouth every 6 (six) hours as needed for moderate pain.  . hydroxychloroquine (PLAQUENIL) 200 MG tablet TAKE 1 TABLET EVERY DAY  . letrozole (FEMARA) 2.5 MG tablet Take 2.5 mg by mouth daily.  . metoprolol tartrate (LOPRESSOR) 25 MG tablet Take 25 mg by mouth daily.   . mycophenolate (CELLCEPT) 500 MG tablet Take 500 mg by mouth 2 (two) times daily. Take two 500 mg tab in the morning and two  500 mg at bed time  . omeprazole (PRILOSEC) 20 MG capsule Take 40 mg by mouth daily.   . ondansetron (ZOFRAN) 4 MG tablet Take 1 tablet (4 mg total) by mouth every 8 (eight) hours as needed for nausea or vomiting.  Marland Kitchen PREDNISONE PO Take 6 mg by mouth daily.   . ranitidine (ZANTAC) 150  MG tablet Take 1 tablet (150 mg total) by mouth at bedtime.  . Tadalafil, PAH, (ADCIRCA) 20 MG TABS Take 2 tablets by mouth daily.   . valACYclovir (VALTREX) 1000 MG tablet Take 1 tablet (1,000 mg total) by mouth 3 (three) times daily.  Marland Kitchen warfarin (COUMADIN) 5 MG tablet Take 1 tablet (5 mg total) by mouth daily.  Marland Kitchen warfarin (COUMADIN) 5 MG tablet Take 1 tablet (5 mg total) by mouth daily. Take as directed  . [DISCONTINUED] ranitidine (ZANTAC) 150 MG tablet Take 1 tablet (150 mg total) by mouth at bedtime.  . [DISCONTINUED] SYNTHROID 150 MCG tablet TAKE 1 TABLET (150 MCG TOTAL) BY MOUTH DAILY. NAME BRAND ONLY  . [DISCONTINUED] valACYclovir (VALTREX) 1000 MG tablet Take 1 tablet (1,000 mg total) by mouth 3 (three) times daily.     Orders Placed This Encounter  Procedures  . DG Bone Density  . Hemoglobin A1c  . Basic metabolic panel  . Protime-INR  . Microalbumin / creatinine urine ratio  . CBC and differential  . TSH  . Microalbumin / creatinine urine ratio  . INR/PT    No Follow-up on file.

## 2014-11-22 NOTE — Assessment & Plan Note (Signed)
Coumadin level is therapeutic,  Advised to Continue current regimen and repeat PT/INR in one month

## 2014-11-22 NOTE — Assessment & Plan Note (Signed)
Thyroid function  Is due for reassessment.   No current changes needed.   Lab Results  Component Value Date   TSH 3.24 10/11/2013

## 2014-11-22 NOTE — Assessment & Plan Note (Signed)
She is s/p partial mastectomy,  XRT complted Dec 2015,  And started aromotase inhibitor with letrazole in Dec 2015. DEXA was ordered but not done.

## 2014-11-25 DIAGNOSIS — M329 Systemic lupus erythematosus, unspecified: Secondary | ICD-10-CM | POA: Diagnosis not present

## 2014-11-25 DIAGNOSIS — Z79899 Other long term (current) drug therapy: Secondary | ICD-10-CM | POA: Diagnosis not present

## 2014-11-28 ENCOUNTER — Other Ambulatory Visit (INDEPENDENT_AMBULATORY_CARE_PROVIDER_SITE_OTHER): Payer: Medicare Other

## 2014-11-28 DIAGNOSIS — E034 Atrophy of thyroid (acquired): Secondary | ICD-10-CM

## 2014-11-28 DIAGNOSIS — E119 Type 2 diabetes mellitus without complications: Secondary | ICD-10-CM | POA: Diagnosis not present

## 2014-11-28 DIAGNOSIS — Z7901 Long term (current) use of anticoagulants: Secondary | ICD-10-CM | POA: Diagnosis not present

## 2014-11-28 DIAGNOSIS — N289 Disorder of kidney and ureter, unspecified: Secondary | ICD-10-CM | POA: Diagnosis not present

## 2014-11-28 DIAGNOSIS — E038 Other specified hypothyroidism: Secondary | ICD-10-CM

## 2014-11-28 LAB — BASIC METABOLIC PANEL
BUN: 20 mg/dL (ref 6–23)
CO2: 28 mEq/L (ref 19–32)
CREATININE: 1.23 mg/dL — AB (ref 0.40–1.20)
Calcium: 9.2 mg/dL (ref 8.4–10.5)
Chloride: 106 mEq/L (ref 96–112)
GFR: 44.89 mL/min — ABNORMAL LOW (ref >60.00)
Glucose, Bld: 134 mg/dL — ABNORMAL HIGH (ref 70–99)
Potassium: 4.2 mEq/L (ref 3.5–5.1)
Sodium: 140 mEq/L (ref 135–145)

## 2014-11-28 LAB — MICROALBUMIN / CREATININE URINE RATIO
CREATININE, U: 41.6 mg/dL
Microalb Creat Ratio: 1.7 mg/g (ref 0.0–30.0)
Microalb, Ur: <0.7 mg/dL (ref 0.0–1.9)

## 2014-11-28 LAB — PROTIME-INR
INR: 2 ratio — ABNORMAL HIGH (ref 0.8–1.0)
Prothrombin Time: 21.8 s — ABNORMAL HIGH (ref 9.6–13.1)

## 2014-11-28 LAB — TSH: TSH: 0.82 u[IU]/mL (ref 0.35–4.50)

## 2014-12-17 ENCOUNTER — Encounter: Payer: Self-pay | Admitting: Nurse Practitioner

## 2014-12-17 ENCOUNTER — Ambulatory Visit (INDEPENDENT_AMBULATORY_CARE_PROVIDER_SITE_OTHER): Payer: Medicare Other | Admitting: Nurse Practitioner

## 2014-12-17 VITALS — BP 138/86 | HR 93 | Temp 98.2°F | Resp 14 | Ht 64.0 in | Wt 154.1 lb

## 2014-12-17 DIAGNOSIS — R109 Unspecified abdominal pain: Secondary | ICD-10-CM

## 2014-12-17 DIAGNOSIS — K5900 Constipation, unspecified: Secondary | ICD-10-CM

## 2014-12-17 DIAGNOSIS — R319 Hematuria, unspecified: Secondary | ICD-10-CM | POA: Diagnosis not present

## 2014-12-17 DIAGNOSIS — N390 Urinary tract infection, site not specified: Secondary | ICD-10-CM | POA: Diagnosis not present

## 2014-12-17 LAB — POCT URINALYSIS DIPSTICK
Bilirubin, UA: NEGATIVE
GLUCOSE UA: NEGATIVE
KETONES UA: NEGATIVE
Nitrite, UA: NEGATIVE
Protein, UA: NEGATIVE
Spec Grav, UA: 1.025
Urobilinogen, UA: 0.2
pH, UA: 5

## 2014-12-17 MED ORDER — LACTULOSE 10 GM/15ML PO SOLN: 10.0000 g | Freq: Two times a day (BID) | ORAL | Status: DC

## 2014-12-17 NOTE — Patient Instructions (Addendum)
Take up to 30 mL of the Lactulose to help you use the bathroom.   Please seek emergency care if you are unable to keep anything down or blood in stool

## 2014-12-17 NOTE — Progress Notes (Signed)
Pre visit review using our clinic review tool, if applicable. No additional management support is needed unless otherwise documented below in the visit note. 

## 2014-12-17 NOTE — Progress Notes (Signed)
Subjective:    Patient ID: Diana [name], female    DOB: 1935-04-10, 80 y.o.   MRN: [id number]  HPI  Diana [name] is a 80 yo female with a CC of constipation, back pain, and urinary frequency.   1) On pain meds, 6 days without using the bathroom--> half pack of ex-lax and then was emptied, 4 more days afterwards of no BM.   Wakes up bloated, does not worsen through out the day  Drinking water, dulcolax last night, 2 ex-lax   2) Cycling 2 weeks ago. Dr. Hyacinth Meeker- flexeril and hydrocodone for back and she has an appointment on Tuesday.   3) Left side pain that she feels like could be a UTI.    Review of Systems  Gastrointestinal: Positive for constipation. Negative for nausea, vomiting, diarrhea and rectal pain.  Genitourinary: Positive for frequency and flank pain. Negative for dysuria and hematuria.  Musculoskeletal: Positive for back pain.      Objective:   Physical Exam  Constitutional: She is oriented to person, place, and time. She appears well-developed and well-nourished. No distress.  BP 138/86 mmHg  Pulse 93  Temp(Src) 98.2 F (36.8 C) (Oral)  Resp 14  Ht 5\' 4"  (1.626 m)  Wt 154 lb 1.9 oz (69.908 kg)  BMI 26.44 kg/m2  SpO2 97%   HENT:  Head: Normocephalic and atraumatic.  Right Ear: External ear normal.  Left Ear: External ear normal.  Cardiovascular: Normal rate, regular rhythm and normal heart sounds.  Exam reveals no gallop and no friction rub.   No murmur heard. Pulmonary/Chest: Effort normal and breath sounds normal. No respiratory distress. She has no wheezes. She has no rales. She exhibits no tenderness.  Abdominal: There is CVA tenderness.  Neurological: She is alert and oriented to person, place, and time. No cranial nerve deficit. She exhibits normal muscle tone. Coordination normal.  Skin: Skin is warm and dry. No rash noted. She is not diaphoretic.  Psychiatric: She has a normal mood and affect. Her behavior is normal. Judgment and thought content  normal.       Assessment & Plan:

## 2014-12-18 ENCOUNTER — Telehealth: Payer: Self-pay | Admitting: *Deleted

## 2014-12-18 NOTE — Telephone Encounter (Signed)
Pt left vm stating that she still has not used the bathroom, Please advise

## 2014-12-19 NOTE — Telephone Encounter (Signed)
Called patient x3.  No answer.  Asked pt to return my call.

## 2014-12-19 NOTE — Telephone Encounter (Signed)
Call this morning and see if she has had a BM. Thanks!

## 2014-12-21 DIAGNOSIS — K5900 Constipation, unspecified: Secondary | ICD-10-CM | POA: Insufficient documentation

## 2014-12-21 DIAGNOSIS — R109 Unspecified abdominal pain: Secondary | ICD-10-CM | POA: Insufficient documentation

## 2014-12-21 LAB — URINE CULTURE

## 2014-12-21 NOTE — Assessment & Plan Note (Signed)
POCT urine is uncertain for infection will obtain culture.

## 2014-12-21 NOTE — Assessment & Plan Note (Signed)
Will try lactulose. Asked her to call if not improved by Thursday.

## 2014-12-22 ENCOUNTER — Other Ambulatory Visit: Payer: Self-pay | Admitting: Nurse Practitioner

## 2014-12-22 DIAGNOSIS — M4186 Other forms of scoliosis, lumbar region: Secondary | ICD-10-CM | POA: Diagnosis not present

## 2014-12-22 DIAGNOSIS — M545 Low back pain: Secondary | ICD-10-CM | POA: Diagnosis not present

## 2014-12-22 MED ORDER — CIPROFLOXACIN HCL 500 MG PO TABS: 500.0000 mg | ORAL_TABLET | Freq: Two times a day (BID) | ORAL | Status: DC

## 2014-12-22 NOTE — Telephone Encounter (Signed)
Spoke with patient and she is continuing to take exlax.  Had a BM over the weekend. Will pick up antibiotic from pharmacy for UTI.

## 2014-12-29 ENCOUNTER — Ambulatory Visit: Admit: 2014-12-29 | Disposition: A | Discharge status: Home / Self Care | Payer: Self-pay | Admitting: Specialist

## 2014-12-29 DIAGNOSIS — S32010A Wedge compression fracture of first lumbar vertebra, initial encounter for closed fracture: Secondary | ICD-10-CM | POA: Diagnosis not present

## 2014-12-29 DIAGNOSIS — M9973 Connective tissue and disc stenosis of intervertebral foramina of lumbar region: Secondary | ICD-10-CM | POA: Diagnosis not present

## 2014-12-29 DIAGNOSIS — M4856XA Collapsed vertebra, not elsewhere classified, lumbar region, initial encounter for fracture: Secondary | ICD-10-CM | POA: Diagnosis not present

## 2014-12-29 DIAGNOSIS — M5386 Other specified dorsopathies, lumbar region: Secondary | ICD-10-CM | POA: Diagnosis not present

## 2014-12-31 ENCOUNTER — Ambulatory Visit
Admit: 2014-12-31 | Disposition: A | Discharge status: Home / Self Care | Payer: Self-pay | Attending: Anesthesiology | Admitting: Anesthesiology

## 2014-12-31 DIAGNOSIS — M5136 Other intervertebral disc degeneration, lumbar region: Secondary | ICD-10-CM | POA: Diagnosis not present

## 2014-12-31 DIAGNOSIS — M4806 Spinal stenosis, lumbar region: Secondary | ICD-10-CM | POA: Diagnosis not present

## 2014-12-31 DIAGNOSIS — M5416 Radiculopathy, lumbar region: Secondary | ICD-10-CM | POA: Diagnosis not present

## 2014-12-31 DIAGNOSIS — M1288 Other specific arthropathies, not elsewhere classified, other specified site: Secondary | ICD-10-CM | POA: Diagnosis not present

## 2015-01-01 DIAGNOSIS — M4186 Other forms of scoliosis, lumbar region: Secondary | ICD-10-CM | POA: Diagnosis not present

## 2015-01-01 DIAGNOSIS — M4856XD Collapsed vertebra, not elsewhere classified, lumbar region, subsequent encounter for fracture with routine healing: Secondary | ICD-10-CM | POA: Diagnosis not present

## 2015-01-02 ENCOUNTER — Other Ambulatory Visit: Payer: Self-pay | Admitting: Internal Medicine

## 2015-01-02 DIAGNOSIS — C50919 Malignant neoplasm of unspecified site of unspecified female breast: Secondary | ICD-10-CM

## 2015-01-02 DIAGNOSIS — M8580 Other specified disorders of bone density and structure, unspecified site: Secondary | ICD-10-CM

## 2015-01-08 DIAGNOSIS — M4856XA Collapsed vertebra, not elsewhere classified, lumbar region, initial encounter for fracture: Secondary | ICD-10-CM | POA: Diagnosis not present

## 2015-01-09 ENCOUNTER — Other Ambulatory Visit: Payer: Self-pay | Admitting: Nurse Practitioner

## 2015-01-09 ENCOUNTER — Telehealth: Payer: Self-pay | Admitting: Internal Medicine

## 2015-01-09 ENCOUNTER — Telehealth: Payer: Self-pay

## 2015-01-09 MED ORDER — CIPROFLOXACIN HCL 500 MG PO TABS: 500.0000 mg | ORAL_TABLET | Freq: Two times a day (BID) | ORAL | Status: DC

## 2015-01-09 NOTE — H&P (Signed)
PATIENT NAME:  Diana [name], Diana [name] MR#:  [id number] DATE OF BIRTH:  1935/06/12  DATE OF ADMISSION:  10/31/2012  REASON FOR ADMISSION: Rash which happens to be disseminated herpes.   HISTORY OF PRESENT ILLNESS: The patient is a very nice 80 year old female who has history of lupus with lupus nephritis, seen by nephrology at [facility], hypothyroidism, history of pulmonary hypertension due to her lupus, and the patient is on immunosuppression due to all these issues. The patient had a rash that started on Sunday night at the level of the waist. It started with 1 or 2 vesicle lesions that spread pretty quickly around her sacrum area of the right side, but now today is starting to spread down to scapular area and above the neck. The patient went to see her dermatologist, who sent her over here for an evaluation. The patient has not been able to eat or drink much. She looks dehydrated, and she is in a significant amount pain. Her pain is 8 or 9 out of 10 and radiates down to her lower extremity on the right side. The rash is located on the right gluteal and sacral area and the left upper scapula and a couple on the side of the neck. The patient is a patient of Dr. Darrick Huntsman, and Dr. Darrick Huntsman asked [place] kindly to keep the patient over here for treatment. The patient states that overall she has been in her normal state of health. She takes prednisone, but whenever she is at higher doses, she states that she develops pneumonias, and she would like not to stop any of her other medications at this moment, as she has been told by her nephrologist in the past.   REVIEW OF SYSTEMS: A 12-system review of systems done.   CONSTITUTIONAL: Negative fever, negative fatigue, negative weakness. Positive pain in her waist and right lower leg.  EYES: Denies any blurry vision, double vision or rashes on her eyes. No lesions in her eyes.  ENT: No tinnitus. No difficulty swallowing. No postnasal drip. Very dry throat.  RESPIRATORY: No cough. No  wheezing. No hemoptysis. The patient uses 1 liter of oxygen at home at night. She has significant pulmonary hypertension and gets exertional dyspnea when she walks about half-block or more sometimes. At this moment, she is not having any shortness of breath or respiratory problems.  CARDIOVASCULAR: No orthopnea, no edema, no palpitations, no syncope. She had a heart catheterization in 2011 which was clean coronary arteries with pulmonary hypertension.  GASTROINTESTINAL: No nausea, vomiting, diarrhea. No abdominal pain. No rectal bleeding. No constipation. No hemorrhoids.  GENITOURINARY: No dysuria or hematuria or changes in frequency. The patient states that within the last 2 days, she has been having some urinary retention.  ENDOCRINOLOGY: No polyuria, polydipsia or polyphagia. No cold or heat intolerance. She has hypothyroidism.  HEMATOLOGIC AND LYMPHATIC: No significant anemia, easy bruising or bleeding or swollen glands.  MUSCULOSKELETAL: The patient does not complain of any significant swelling of joints, but she has right lower extremity pain which is radiating from her waist, and this is likely related to neuralgia.  NEUROLOGIC: No CVA. No TIA. No numbness or weakness. Pain radiating from dermatomal irritation, likely neuralgia.  PSYCHIATRIC: No anxiety. No depression.   PAST MEDICAL HISTORY:  1. Lupus. 2. SLE nephritis. 3. Hypothyroidism. 4. Pulmonary hypertension.   PRIMARY CARE PHYSICIAN: Dr. Duncan Dull.  NEPHROLOGY: [facility], Dr. Caryn Section.   PAST SURGICAL HISTORY: Foot surgery and also back surgery 30 years ago and also kidney biopsy.  ALLERGIES: THE PATIENT IS ALLERGIC TO DARVOCET, IODINE, KEFLEX, SULFA DRUGS AND ULTRAM.   FAMILY HISTORY: Coronary artery disease in her mom, who died from an MI. History of severe alcoholism in a brother. History of breast cancer in her sister, who was BRCA positive at the age of 5.   SOCIAL HISTORY: The patient denies any tobacco use. She lives with  her son. She drinks alcohol very seldomly. 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   MEDICATIONS: Zantac 300 mg once daily, vitamin E 400 mg once daily, vitamin D 1000 mg once daily, Synthroid 125 mcg once a day, Prilosec 20 mg once a day, Prempro 0.45/1.5 mg once daily, prednisone 7 mg once a day, Plaquenil 200 mg once a day, loratadine 10 mg once a day, CellCept 1,000 mg twice daily, aspirin 81 mg once a day, Adcirca 20 mg once a day.   PHYSICAL EXAMINATION: VITAL SIGNS: Blood pressure 142/79, pulse 80, respiratory rate 20, temperature 97.  GENERAL: The patient is alert and oriented x3. No acute distress. No respiratory distress. Hemodynamically, looks stable. She looks dehydrated. Her pulse before fluids was 101; after fluids is 80. The patient looks dry in general, but stable.  HEENT: Pupils are equal, round and reactive. Extraocular movements intact. Mucosa dry, No rashes on mucosa. No lesions on mucosa. No oral lesions. No oropharyngeal exudates.  NECK: Supple. No JVD. No thyromegaly. No adenopathy. No carotid bruits.  CARDIOVASCULAR: Regular rate and rhythm. No murmurs, rubs or gallops.  LUNGS: Clear without any wheezing or crepitus. No use of accessory muscles.  HEART: There is no displacement of PMI on the heart, and no tenderness to palpation of anterior chest wall.  ABDOMEN: Soft, nontender, nondistended. No hepatosplenomegaly. No masses. Bowel sounds are positive.  EXTREMITIES: No edema, no cyanosis, no clubbing.  SKIN: Decreased turgor, looks dehydrated. There is a vesicular rash on the sacral area, right side of her body, which is clustered. The patient has also dissemination of vesicles on the left upper scapula and left neck. This is likely herpes zoster. No other rashes. No other mucosal involvement.  NEUROLOGIC: Cranial nerves II through XII intact. Strength is 5 out of 5 in all 4 extremities. Sensation is  normal in all 4 extremities.  LYMPHATIC: Negative for lymphadenopathy in neck or supraclavicular areas.  MUSCULOSKELETAL: No significant joint effusions or joint lesions.  PSYCHIATRIC: Mood is normal, without any signs of depression.   RESULTS: BUN is 22, creatinine 1.14, glucose 93, serum sodium 139, potassium 3.8. GFR is around 40. White cells 4.6, hemoglobin is 12.5, platelets 189.   ASSESSMENT AND PLAN: A 80 year old female with history of hypothyroidism, lupus, lupus nephritis and pulmonary hypertension, who comes with disseminated herpes zoster.   1. Herpes zoster, disseminated. The patient has involvement of at least 3 dermatomes. The patient is very symptomatic with a lot of pain. We are going to admit her due to severe dehydration and control of her symptoms. The patient is going to be taking acyclovir, which is going to be renally dosed as the patient has nephritis due to her lupus, and her GFR is close to 40. The patient instead of getting acyclovir every 8 hours is going to be given it every 12 hours. She is going to have a good amount of IV fluids to avoid crystal formation in her urine due to acyclovir. The patient will also have gabapentin for postherpetic  neuralgia as the patient is having pain radiating to the right lower extremity. I am going to avoid steroids in this patient as the patient is already taking prednisone. I am not going to give her a higher dose as most of the studies double-blinded showed that acyclovir alone versus acyclovir with glucocorticoid has the same effect on most of the patients, and in this patient being immunosuppressed and having complications in the past, it is probably better to avoid it. She is going to continue taking her medications for now. If there is any further dissemination or any worsening of the symptoms or infection, we are going to have to hold the CellCept and Adcirca.  2. Dehydration. Continue IV fluids, will be gentle hydration due to the  patient's pulmonary hypertension.  3. Hypothyroidism. Continue thyroid replacement.  4. Pulmonary hypertension. The patient needs to stay on oxygen overnight. At this moment, there are no significant signs of pulmonary problems or shortness of breath.  5. Urinary retention. The patient feels like she is having some urinary retention issues. Rule out urinary tract infection. I am going to get a UA.  6. Admission is due to disseminated herpes zoster and dehydration.  7. The patient is a full code.  8. Deep vein thrombosis prophylaxis withPLC's 9. Gastrointestinal prophylaxis with Protonix.   TIME SPENT: I spent about 45 minutes with this patient.   ____________________________ Felipa Furnace, MD rsg:OSi D: 10/31/2012 13:07:27 ET T: 10/31/2012 13:45:14 ET JOB#: [id number]  cc: Felipa Furnace, MD, <Dictator> Rosalita Carey Juanda Chance MD ELECTRONICALLY SIGNED 11/07/2012 14:29

## 2015-01-09 NOTE — Telephone Encounter (Signed)
Sent in Cipro to pharmacy. Take probiotic and keep an eye on her INR/PT

## 2015-01-09 NOTE — Telephone Encounter (Signed)
[facility] call received and says pt is scheduled to have back surgery next week. However, she was recently treated for a UTI. Symptoms have returned. Pt has limited transportation wants to know if she can have another round of antibiotics called into her pharmacy so that her upcoming surgery does not have to be rescheduled.

## 2015-01-09 NOTE — Consult Note (Signed)
Urology Consultation Report For Consultation: Acute Urinary Retention in patient with multi-dermatomal shingles. MD: Darcella Cheshire, M.D.MD: Demetrios Loll, M.D.Dr. Iline Oven, [place] 80 y.o WF with SLE and h/o Lupus Nephritis who has been followed at [facility] for the past 20 yrs, who presented to [facility] 10/31/2012 with a painful vesicular rash and dehydration consistent Herpes Zoster. The rash initailly involved the waist, then the right sacrum, then the scapular/neck region. The patient complained of voiding difficulty on admission for the 48 hours prior to admission. UA revealed microscopic hematuria with 7 rbc/hpf. UC&S returned negative. A bladder scan this afternoon revealed a post-void residual of 334mL. Urology consultation is requested. History: The pt denies any prior urologic difficulty, aside from her diagnosis of Lupus Nephritis for the past 20 yrs with mild, chronic microscopic hematuria (<2 rbc/hpf, per pt). The pt has never been seen by urology in the past. Denies any personal or FH of Urolithiasis. Does report a 20 yr smoking history (1/2 pack/week) that she discontinued 20 yrs ago. She denies any FH of GU malignancies. She denies any h/o pelvic surgery or radiation. Beginning on 10/28/2012, at the same time as the appearance of the rash at her waist, the pt reports the development of painful urinary urgency with an increase in urinary frequency to approx every 2 hrs. The painful urgency did not resolve with voiding. Denies dysuria or gross hematuria, F/C. Stable, chronic constipation (BM's q3 days requiring ?Dulculax). Denies any focal weakness. After admission, the pt developed large volume urge incontinence and was found to have a PVR of 334mL earlier this afternoon. as per the Admission H&P by Dr. Danise Mina on 10/31/2012, confirmed by the patient, except for the following: Chronic constipation (?due to chronic meds, per pt, requiring ?Dulculax every 3 days for a BM).Painful urinary urgency with  frequency, as per the Urologic History above.Iodine sensitivity - pt reports facial hives with IV Contrast for a CT 20 yrs ago.Former smoker (d/c'd x 20 yrs: 1/2 pack/week for 20 yrs). T 98.1 F; BP 143/71; P 104; RR 18; O2 Sat 94% on RAWDWN WF in NADNo rashes or lesions about the Head or Upper Extremities (detailed exam deferred to Medicine/ID)no cervical/supraclavicular adenopathyNC/AT, EOMI, anictericno masses/bruitsCTA, nL Resp EffortRRR without murmur/gallops/rubs, 2+ Radial Pulses, b/Ltender lower abd with palpable bladder - no guarding/rebound; no organomegaly; no CVATno LE edemanon-focal with the exception of pain in the right LE with dorsi/plantar flexion; 5/5 strength all muscle groups of the LE's, b/LA&O x4, appropriate/cooperative, pleasant 11/03/2012 Cr (1.0); Admission UA as above (microscopic hematuria with 7 rbc/hpf)  Acute Urinary Retention with Overflow Incontinencewith Herpes Zoster, dermatomally (right sacrum), although no peripheral motor weakness appreciated. Microhematuria?trace microhematuria in the past, presumed to be due to her established Lupus Nephritiis diagnosed 20 yrs ago by Renal Biopsy, however, with an increase in the degree on admission. Herpes Zoster can involve the bladder with vesicular lesions and hematuria, however, given her chronic immunosuppression, she should undergo a comprehensive evaluation. This was discussed at length with the patient, who prefers this be performed at [facility] where her nephrology care has been for the past 20 yrs. She understands this will require Urology Consultation at [facility].  Place a foley catheter to straight drainage (16 Fr) with voiding trial after resolution of this Zoster episode.Would cover with an appropriate suppressive antibiotic (trimethoprim 100mg  po qDay or Cipro 250mg  po qDay).Microhematuria Evaluation with Stone CT, MRI Abd/Pelvis (+/- IV Contrast), Cystourethroscopy (may be performed as an outpatient - pt requests this be done  at  [facility], as noted above). you for the opportunity to participate in the care of this most pleasant woman.  Electronic Signatures: Darcella Cheshire (MD)  (Signed on 16-Feb-14 19:03)  Authored  Last Updated: 16-Feb-14 19:03 by Darcella Cheshire (MD)

## 2015-01-09 NOTE — Telephone Encounter (Addendum)
Patient having difficulty emptying bladder, patient saw NP on 12/17/14 took the  ABX for UTI but feels she never really got over the UTI, patient has been DX with compression fracture by Dr Earnestine Leys patient is to have surgery next Thursday for compression fracture and feels she is having a UTI patient cannot drive and has no transportation to MD office Please advise. Culture results are present in chart patient was prescribed Cipro.

## 2015-01-09 NOTE — Discharge Summary (Signed)
PATIENT NAME:  Diana [name], Diana [name] MR#:  [id number] DATE OF BIRTH:  04-06-1935  DATE OF ADMISSION:  10/31/2012 DATE OF DISCHARGE:  11/07/2012  DISCHARGE DIAGNOSES:  1. Herpes zoster, disseminated. Now lesions scabbed over. Will need total 14 days of Valtrex and isolation until all the lesions are completely scabbed over.  2. Headache and nausea, likely due to side effect from acyclovir, improved and resolved. 3. Lupus, with nephritis, on CellCept and Adcirca along with prednisone.  4. Dehydration, improved with IV fluids.  5. Pulmonary hypertension. Will require 1 liter oxygen mainly at night, which she chronically uses. 6. Urinary retention. Will require outpatient urology followup.   SECONDARY DIAGNOSES:  1. Lupus.  2. Hypothyroidism. 3. Pulmonary hypertension.   CONSULTATION: Infectious disease, Dr. Leavy Cella.   PROCEDURES AND RADIOLOGY:  1. Chest x-ray on 14th of February showed no acute cardiopulmonary disease.  2. Chest x-ray on 16th of February showed insertion of right-sided central venous catheter, without evidence of pneumothorax.   MAJOR LABORATORY PANEL: Urinalysis on 12th of February was negative. Urine culture was contaminated.   HISTORY AND SHORT HOSPITAL COURSE: The patient is a 80 year old female with the above-mentioned medical problems, who was admitted for disseminated herpes. She was started on IV acyclovir. Infectious disease consultation was obtained with Dr. Leavy Cella. Please see Dr. Ardine Eng dictated history and physical for further details. Dr. Leavy Cella agreed with continuing acyclovir, and subsequently, as the lesions started improving, switched her over to oral Valtrex. The patient was evaluated by urology due to urinary retention for which Dr. Selena Batten from urology recommended voiding trial and insertion of Foley, which was done, and the patient was feeling much better, for which catheter was removed, and she did successfully void the day before discharge. Her shingles pain  was fairly well controlled, and her lesions were improving, and most of them were getting dried out. Dr. Leavy Cella switched her over to oral Valtrex to finish total 14-day course. On 19th of February, she was much better and was discharged home in stable condition.   On the date of discharge, her vital signs were as follows: Temperature 99, heart rate 103 per minute, respirations 18 per minute, blood pressure 117/78 mmHg, she was saturating 93% on 1 liter oxygen via nasal cannula   PERTINENT PHYSICAL EXAMINATION ON THE DATE OF DISCHARGE:  CARDIOVASCULAR: S1, S2 normal. No murmurs, rubs or gallop.  LUNGS: Clear to auscultation bilaterally. No wheezing, rales, rhonchi or crepitation.  ABDOMEN: Soft, benign.  NEUROLOGIC: Nonfocal examination.  SKIN: The patient had dermatomal rash down into her right leg, with some scattered vesicles and reticular rash on the left shin, which was improving.  All other physical examination remained at the baseline.   DISCHARGE MEDICATIONS:  1. Vitamin D 1000 mg p.o. daily.  2. Adcirca 20 mg p.o. daily.  3. Zantac 300 mg p.o. q.h.s.   4. Loratadine 10 mg p.o. daily.  5. Prednisone 7 mg p.o. daily.  6. CellCept 500 mg 2 tablets p.o. b.i.d.  7. Aspirin 81 mg p.o. daily.  8. Plaquenil 200 mg p.o. daily.  9. Prempro once daily.  10. Synthroid 125 mcg p.o. daily.  11. Prilosec 20 mg p.o. daily.  12. Vitamin E 400 international units once daily.  13. Acetaminophen/hydrocodone 325/5 one tablet p.o. every 8 hours as needed.  14. Gabapentin 300 mg p.o. b.i.d.  15. Zofran 4 mg p.o. every 8 hours as needed.  16. Valacyclovir 1 gram p.o. every 8 hours for 12 more days.   DISCHARGE  DIET: Low sodium, low fat, low cholesterol.   DISCHARGE ACTIVITY: As tolerated.   DISCHARGE INSTRUCTIONS AND FOLLOWUP: The patient was instructed to follow up with her primary care physician, Dr. Duncan Dull, in 1 to 2 weeks. She will need followup with Dr. Assunta Gambles in 2 to 4 weeks from  urology and Dr. Casimiro Needle Blocker from infectious diseases in 2 to 4 weeks. She was requested to continue isolation precautions until all her lesions are scabbed over. She was set up to continue her 1 liter oxygen via nasal cannula continuous, mainly at night, as she already uses at home.   TOTAL TIME DISCHARGING THIS PATIENT: 55 minutes.   ____________________________ Ellamae Sia. Sherryll Burger, MD vss:OSi D: 11/07/2012 23:33:01 ET T: 11/08/2012 07:05:54 ET JOB#: [id number]  cc: Alp Goldwater S. Sherryll Burger, MD, <Dictator> Duncan Dull, MD Rosalyn Gess. Blocker, MD Madolyn Frieze. Achilles Dunk, MD Patricia Pesa MD ELECTRONICALLY SIGNED 11/08/2012 12:22

## 2015-01-09 NOTE — Consult Note (Signed)
PATIENT NAME:  Diana [name], Diana [name] MR#:  [id number] DATE OF BIRTH:  05/03/1935  DATE OF CONSULTATION:  11/02/2012  REFERRING PHYSICIAN:   Dr. Imogene Burn CONSULTING PHYSICIAN:  Rosalyn Gess. Jobanny Mavis, MD  REASON FOR CONSULTATION:  Disseminated varicella infection.   HISTORY OF PRESENT ILLNESS: The patient is a 80 year old female with a past history significant for hypothyroidism and lupus nephritis on chronic immunosuppression with prednisone, Plaquenil and CellCept who was admitted on 02/12 with approximately 3 to 4-day history of vesicular rash on the back running down the right posterior leg which was extremely painful. She has had progression of the rash from blisters to ulcers to scabs but has had some scattered additional areas at other sites. The patient denies any fevers, chills or sweats. She has had some nausea, no vomiting. She has not had any focal neurologic symptoms. She has had some difficulty urinating, which began a day or so before admission. She has not had any frank dysuria but has had some abdominal pain associated with not being able to urinate. She denies any hematuria. She has stated that over the last day or so she has had some increasing shortness of breath.   ALLERGIES:  INCLUDE DARVOCET, IODINE, KEFLEX, SULFA AND ULTRAM.   PAST MEDICAL HISTORY: 1.  Lupus nephritis, on chronic immunosuppression.  2.  Pulmonary hypertension.  3.  Hypothyroidism.   FAMILY HISTORY:  Positive for coronary artery disease, alcoholism and breast cancer.   SOCIAL HISTORY: The patient lives with her son. She does not smoke. She drinks rarely. She has no pets at home.  REVIEW OF SYSTEMS:  GENERAL:  No fevers, chills or sweats. Some malaise. No fatigue.  HEENT:  No headaches. No sinus congestion. No sore throat.  NECK:  No stiffness. No swollen glands.   RESPIRATORY:  Some shortness of breath. No cough. No sputum production. This shortness of breath began over the last day or so.  CARDIAC:  No chest  pains or palpitations.  GASTROINTESTINAL:  Some nausea. No vomiting. No change in her bowels.  GENITOURINARY:  She is having increased urinary hesitancy and some abdominal fullness. No dysuria. No flank pain other than related to the rash.  EXTREMITIES:  No swelling. No particular focal joint or muscle complaints.  SKIN:  She has had the vesicular rash as described above which is painful radiating down her leg.  NEUROLOGIC:  No focal weakness.  PSYCHIATRIC:  No complaints. All other systems are negative.   PHYSICAL EXAMINATION: VITAL SIGNS:  T-max of 98.6, T-current of 98.1, pulse 89, blood pressure 151/81, 94% on room air.  GENERAL:  A 80 year old white female in no acute distress.  HEENT:  Normocephalic, atraumatic. Pupils equal and reactive to light. Extraocular motion appears to be intact. Oropharynx shows normal lips and gums.  NECK:  Midline trachea. No lymphadenopathy. No thyromegaly.  CHEST:  Clear to auscultation bilaterally with good air movement. No focal consolidation.  CARDIAC:  Regular rate and rhythm without murmur, rub or gallop. ABDOMINAL: Her abdomen was soft. She had some discomfort in the suprapubic area. There was no rebound or guarding. There were no masses appreciated. There are no hernias noted.  EXTREMITIES:  No cyanosis, clubbing or edema. There is no evidence for tenosynovitis.  SKIN:  She had a rash with eschar extending from the middle of the back down the right leg posteriorly with many of the lesions being coalesced and with a dark eschar present. There was some surrounding erythema. This is consistent with shingles. There  were several scattered individual lesions that had a vesicular component on an erythematous background consistent with individual varicella lesions, one was on her right hand, another was on the right scapular area. These appeared to be individual varicella lesions, not within a consistent dermatome.  NEUROLOGIC:  The patient was awake and  interactive, moving all 4 extremities.  PSYCHIATRIC:  Mood and affect appeared normal.   LABORATORY DATA:  BUN of 10, creatinine 0.99, bicarbonate 23, anion gap of 8. White count yesterday of 3.0 with hemoglobin 11.7, platelet count of 161, ANC of 1.9. White count on admission was 4.6. Urinalysis had 1+ blood, positive nitrites, negative leukocyte esterase, 7 red cells and 2 white cells per high-powered field. Urine culture shows no growth.   IMPRESSION:  A 80 year old female with a history of lupus nephritis, who is admitted with shingles.   RECOMMENDATIONS: 1.  She has a dermatomal rash which extends down her right leg. She has significant pain along the right leg. She also has scattered lesions that are consistent with a varicella outside this dermatomal distribution. This is likely due to her immunosuppression. 2.  She has some urinary hesitancy and nitrites on UA. Her urine culture is negative. We will stop her levofloxacin. Question whether the bladder issue is related to her shingles. She states that it started prior to coming to the hospital. We will consider obtaining a postvoid residual. If high, she may need Foley catheterization and urology consultation.  3.  She has some shortness of breath which started today. We will get a chest x-ray to ensure no evidence for varicella pneumonia. This would be less likely with reactivation but would still be possible with her immunosuppression.  4.  We will continue acyclovir for now. We will change to Valtrex when she begins to improve.  5.  We will continue isolation until all lesions have scabbed over. This is a moderately complex infectious disease case.  Thank you very much for involving me in the patient's care.      ____________________________ Rosalyn Gess. Ajdin Macke, MD meb:ce D: 11/02/2012 10:25:11 ET T: 11/02/2012 11:23:58 ET JOB#: [id number]  cc: Rosalyn Gess. Devina Bezold, MD, <Dictator> Reita Shindler E Dalissa Lovin MD ELECTRONICALLY SIGNED 11/05/2012  16:22

## 2015-01-09 NOTE — Telephone Encounter (Signed)
Patient Name: Diana [name] DOB: 02-13-1935 Initial Comment Caller states she was given medication for a urinary tract infection and it has came back Nurse Assessment Nurse: Vallery Sa, RN, Tye [place] Date/Time (Eastern Time): 01/09/2015 12:08:39 PM Confirm and document reason for call. If symptomatic, describe symptoms. ---Caller states she completed antibiotics for UTI about 3 weeks ago and developed urinary urgency about a week ago. No fever. No blood in her urine. Has the patient traveled out of the country within the last 30 days? ---No Does the patient require triage? ---Yes Related visit to physician within the last 2 weeks? ---Yes Does the PT have any chronic conditions? (i.e. diabetes, asthma, etc.) ---Yes List chronic conditions. 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 She asks to send a message to MD to see if they want to call in another round of antibiotics since she is scheduled for back surgery next week. Called the office backline and was transferred to an answering machine by Hoyle Sauer. Called the office backline and notified [name].

## 2015-01-09 NOTE — H&P (Signed)
PATIENT NAME:  Diana [name], Diana [name] MR#:  [id number] DATE OF BIRTH:  Jun 17, 1935  DATE OF ADMISSION:  05/22/2013  CHIEF COMPLAINT: Low back pain with right posterolateral hip pain.   PROCEDURE: None.   HISTORY OF PRESENT ILLNESS: Ms. Zingale is a pleasant 80 year old white female with long-standing history of low back pain present over 2 years. It primarily involves the low back with radiation to the right lateral hip. This gradually has been getting worse, rated at a maximum VAS of 8, best a 5, averaging about a 6. It does not appear to be influenced by time of day but is aggravated by many different activities including bending, kneeling, lifting, sitting, standing, and alleviated by rest, warm showers and baths, lying down, medication management and nerve blocks. She has associated constipation, fatigue and spasms, with numbness affecting the right lateral leg and tingling. The quality of pain is aching, annoying, constant, burning, nagging-type pain, with history of previous CT scan. She has had previous trigger point injections that have given her some relief but nothing sustained.   PAST MEDICAL HISTORY: Significant for a left lower extremity DVT, for which she now receives Coumadin. She also has a history of lung problems with shortness of breath associated with her lupus and pulmonary hypertension. She also has a history of scoliosis, hiatal hernia, reflux, constipation, blood in her urine, kidney disease, history of easy bruising, thyroid disease with lupus, chronic fatigue syndrome and pulmonary hypertension as mentioned. She also has a past medical history significant for shingles.  SOCIAL HISTORY: She is divorced with 1 child. Quit smoking 16 years ago. She is retired.   FAMILY HISTORY: Alcoholism and cancer.   MEDICATIONS: Include vitamin D, Zantac, loratadine, prednisone, CellCept, Plaquenil, Prempro, Prilosec, vitamin E, gabapentin 300 mg twice a day and Zofran, in addition to Percocet 5/325  one tablet q.6, Coumadin 5 mg once a day, Synthroid, Lasix and Flexeril.   PHYSICAL EXAMINATION:  VITAL SIGNS: VAS of 4/10, temperature 97.9, blood pressure 156/86, pulse 98, regular respirations.  HEENT: Pupils are equally round and reactive to light. Extraocular muscles intact.  HEART: Regular rate and rhythm.  LUNGS: Clear to auscultation.  MUSCULOSKELETAL: Inspection of the low back reveals an area consistent with previous shingles affecting the right L4 or L5 distribution, right side in the flank, with radiation into the right posterolateral buttocks. No evidence of any infection or erythema is noted at this time. She has some mild tenderness in the paraspinous muscles. On inspection of the lower extremities, strength and function appeared to be well preserved. She walks with an antalgic gait.   ASSESSMENT: 1.  Spinal stenosis. 2.  Previous shingles in the L4-L5 distribution, right side.   PLAN: Will proceed with a series of epidural steroid injections to see if we can get her some relief. The risks and benefits are once again reviewed with her in full detail. She will need to come off her Coumadin 5 days prior to any injection therapy. This has been discussed with her in detail. She can continue her Percocet. We will have her return to the clinic in approximately 1 to 2 weeks for her first injection.    ____________________________ Alvina Filbert Andree Elk, MD jga:jm D: 05/22/2013 16:38:00 ET T: 05/22/2013 16:59:46 ET JOB#: [id number]  cc: Alvina Filbert. Andree Elk, MD, <Dictator> Alvina Filbert ADAMS MD ELECTRONICALLY SIGNED 05/27/2013 13:30

## 2015-01-09 NOTE — Consult Note (Signed)
Impression:    80yo female with history of lupus nephritis admitted with shingles.     She has a dermatomal rash which extends down her right leg.  She has significant pain along the right leg.  She also has scattered lesions that are consistent with Varicella outside this dermatomal distribution.  This is likely due to her immunosuppression.    She has some urinary hesitancy and nitrites on u/a.  Her culture is negative.  Will stop her levofloxacin.  ? whether the bladder issue is related to her shingles.  She states that it started prior to coming to the hospital.  Would consider obtaining a postvoid residual.  If high, she may need foley catheterization and urology consultation.    She has some SOB which started today.  Will get a CXR to ensure no evidence for Varicella pneumonia.  This would be less likely with reactivation, but would still be possible with her immunosuppression.    Continue acyclovir for now.  Will change to valtrex once she begins to improve.    Continue isolation until all lesions have scabbed over.  Electronic Signatures: Ala Capri MPH, Heinz Knuckles (MD)  (Signed on 14-Feb-14 10:16)  Authored  Last Updated: 14-Feb-14 10:16 by Simpson Paulos MPH, Heinz Knuckles (MD)

## 2015-01-10 NOTE — Op Note (Signed)
PATIENT NAME:  Diana [name], Diana [name] MR#:  [id number] DATE OF BIRTH:  1935/08/17  DATE OF PROCEDURE:  06/03/2014  PREOPERATIVE DIAGNOSIS: Carcinoma of the left breast.   POSTOPERATIVE DIAGNOSIS: Carcinoma of the left breast.   PROCEDURE: Left partial mastectomy with axillary sentinel lymph node biopsy.   SURGEON: Renda Rolls, MD  ANESTHESIA: General.   INDICATIONS: This 80 year old female recently had mammogram depicting a density in the upper outer aspect of the left breast. Ultrasound demonstrated a 1.4 cm mass. She had ultrasound-guided core biopsy demonstrating infiltrating mammary carcinoma. Surgery was recommended for definitive treatment. She did have preop injection of radioactive technetium sulfur colloid and insertion of a Kopans wire in the upper outer peripheral aspect of the left breast.   DESCRIPTION OF PROCEDURE: The patient was placed on the operating table in the supine position under general anesthesia. The dressing was removed from the left breast exposing the Kopans wire, which was cut 2 cm from the skin. The breast, axilla, chest wall and upper arm were prepared with ChloraPrep and draped in a sterile manner.   In the upper outer quadrant of the left breast, an obliquely oriented curvilinear incision was made from approximately 12:30 to 2:30 position. I did remove an ellipse of skin which was approximately 1 cm in width. The dissection was carried down through subcutaneous tissues using electrocautery for hemostasis and dissected down to encounter the Kopans wire and approach the thick portion of the wire, and with further dissection the mass could be palpated and palpation of the mass helped to direct the extent of the dissection as breast tissue surrounding the mass was excised with electrocautery. One clamped bleeding point was suture ligated with 4-0 chromic. The dissection was carried down deeply within the breast around the mass. It is noted that the 2:30 end of the skin  ellipse was tagged with a nylon stitch. Also, margin maps were used to suture markers to the specimen to mark the cranial, caudal, medial, lateral, and deep margins. This was submitted for specimen mammogram and pathology.   The axilla was probed with a gamma counter, and it appeared that the incision was close enough to the axilla that dissection could be carried from that point into the axilla. The axillary fat pad was encountered, and the gamma counter was used to detect radioactivity. Dissection was carried out holding retractors in place and using hemostats to spread. This was a somewhat tedious dissection and over half the operation was spent doing the lymph node biopsy and did find a lymph node, which was soft and more than a centimeter in dimension, which had some radioactivity and was dissected free from surrounding structures. This was adjacent to the rib cage. It  was removed with some surrounding fatty material. The ex vivo counts were in the range of 8 to 12 counts per second. The background count contained additional radioactivity and further dissection was carried out and identified another lymph node, which was somewhat smaller, which was dissected free from surrounding structures. Its pedicle was ligated with 4-0 chromic. The ex vivo count was in the range of 10 to 18. This was labeled as sentinel lymph node #2. The background count was minimal. There was no remaining palpable mass within the axilla. Next, the wound was further inspected and several small bleeding points were cauterized. Hemostasis was subsequently intact.   It is noted that during the course of the procedure the radiologist did call to indicate that the specimen mammogram demonstrated the location of  the mass and the biopsy marker and was sent on to pathology. The pathologist later called back to say that margins were satisfactory.  The subcutaneous tissues were infiltrated with 0.5% Sensorcaine with epinephrine and also some  of the deeper tissues were infiltrated around the cautery artifacts. Next, the subcutaneous tissues were closed with interrupted 4-0 chromic. The skin was closed with running 4-0 Monocryl subcuticular suture and Dermabond. The patient tolerated surgery satisfactorily and was then prepared for transfer to the recovery room. ____________________________ Shela Commons. Renda Rolls, MD jws:sb D: 01-19-202015 14:06:31 ET T: 01-19-202015 14:57:34 ET JOB#: [id number]  cc: Adella Hare, MD, <Dictator> Adella Hare MD ELECTRONICALLY SIGNED 01-03-202015 19:45

## 2015-01-10 NOTE — Consult Note (Signed)
Reason for Visit: This 80 year old Female patient presents to the clinic for initial evaluation of  breast cancer .   Referred by Dr. Oliva Bustard.  Diagnosis:  Chief Complaint/Diagnosis   80 year old female with stage I (T1 C. N0 M0) invasive mammary carcinoma status post wide local excision and sentinel node biopsy tumor is ER/PR positive HER-2/neu not over expressed with a low Oncotype DX score of 17 for adjuvant whole breast radiation and possible aromatase inhibitor  Pathology Report pathology report reviewed   Imaging Report mammograms reviewed   Referral Report clinical notes reviewed   Planned Treatment Regimen adjuvant whole breast radiation hypofractionated regiment   HPI   patient's a pleasant 80 year old female who presented with an abnormal mammogramshowing 1.4 cm mass in the upper outer left breast at the 1:30 position highly suspicious for malignancy. She underwent ultrasound-guided biopsy which was positive for invasive mammary carcinoma. Tumor was ER PR positive HER-2/neu not over expressed.she underwent a wide local excision for a 1.3 cm grade 2 invasive mammary carcinoma. DCIS was positive although margins were negative although close at less than 1 mm as well as for the invasive margin. 2 sentinel lymph nodes were both negative for metastatic disease. She tolerated her surgery well although is feeling quite fatigued at this time. She had Oncotype DX performed which had a score of 17 showing a low risk of recurrence. She's been seen by medical oncology and is now referred to radiation oncology for opinion. She specifically denies breast tenderness cough or bone pain. Patient does have a past medical history significant for lupus and DVT  Past Hx:    neuropathy:    shingles:    dvt:    pulmonary HTN:    Hypothyroidism:    Lupus:    foot surgery 5 weeks ago: 2005  Past, Family and Social History:  Past Medical History positive   Respiratory pulmonary hypertension    Endocrine hypothyroidism   Neurological/Psychiatric peripheral neuropathy   Past Surgical History foot surgery 5 weeks prior   Past Medical History Comments DVT, lupus, shingles   Family History positive   Family History Comments mother with colon cancer sister with breast cancer at age of 49, son with lung cancer   Social History positive   Social History Comments 30-pack-year smoking history quit smoking 10 years prior.   Allergies:   Keflex: GI Distress  Iodinated Radiocontrast Dyess: Rash  Sulfa drugs: Hives, Rash  Darvocet - N: GI Distress  Ultram: Hives, Rash  Home Meds:  Home Medications: Medication Instructions Status  Zantac 300 300 mg oral tablet 1 tab(s) orally once a day (at bedtime) Active  CellCept 500 mg oral tablet 2 tab(s) orally 2 times a day Active  Plaquenil Sulfate 200 mg oral tablet 1 tab(s) orally once a day Active  Adcirca 20 mg oral tablet 2 tab(s) orally once a day Active  Lasix 20 mg oral tablet  orally every other day Active  Synthroid 150 mcg (0.15 mg) oral tablet 1 tab(s) orally once a day Active  Vitamin B-12 1000 mcg/mL injectable solution  injectable  Active  metoprolol 25 mg oral tablet  Take 12.5 mg. by mouth once daily  Active  PriLOSEC 40 mg oral delayed release capsule 1 cap(s) orally once a day Active  predniSONE 6 milligram(s) orally once a day Active  Calcium 500+D 500 mg-400 intl units oral tablet, chewable 1 tab(s) orally once a day Active  Xyzal 5 mg oral tablet 1 tab(s) orally once a  day (in the evening) Active  Dulcolax Laxative 5 mg oral delayed release tablet 1 tab(s) orally once a day, As Needed Active  acetaminophen-HYDROcodone 325 mg-5 mg oral tablet 1 tab(s) orally every 6 hours, As Needed Active  Coumadin 5 mg oral tablet 1 tab(s) orally once a day Active   Review of Systems:  General negative   Performance Status (ECOG) 0   Skin negative   Breast see HPI   Ophthalmologic negative   ENMT negative    Respiratory and Thorax negative   Cardiovascular negative   Gastrointestinal negative   Genitourinary negative   Musculoskeletal negative   Neurological negative   Psychiatric negative   Hematology/Lymphatics negative   Endocrine negative   Allergic/Immunologic negative   Review of Systems   except for fatigue and slight peripheral neuropathydenies any weight loss, fatigue, weakness, fever, chills or night sweats. Patient denies any loss of vision, blurred vision. Patient denies any ringing  of the ears or hearing loss. No irregular heartbeat. Patient denies heart murmur or history of fainting. Patient denies any chest pain or pain radiating to her upper extremities. Patient denies any shortness of breath, difficulty breathing at night, cough or hemoptysis. Patient denies any swelling in the lower legs. Patient denies any nausea vomiting, vomiting of blood, or coffee ground material in the vomitus. Patient denies any stomach pain. Patient states has had normal bowel movements no significant constipation or diarrhea. Patient denies any dysuria, hematuria or significant nocturia. Patient denies any problems walking, swelling in the joints or loss of balance. Patient denies any skin changes, loss of hair or loss of weight. Patient denies any excessive worrying or anxiety or significant depression. Patient denies any problems with insomnia. Patient denies excessive thirst, polyuria, polydipsia. Patient denies any swollen glands, patient denies easy bruising or easy bleeding. Patient denies any recent infections, allergies or URI. Patient "s visual fields have not changed significantly in recent time.   Nursing Notes:  Nursing Vital Signs and Chemo Nursing Nursing Notes: *CC Vital Signs Flowsheet:   14-Oct-15 11:26  Temp Temperature 98.8  Pulse Pulse 92  Respirations Respirations 18  SBP SBP 131  DBP DBP 81  Current Weight (kg) (kg) 70  Height (cm) centimeters 166  BSA (m2) 1.7    Physical Exam:  General/Skin/HEENT:  General normal   Skin normal   Eyes normal   ENMT normal   Head and Neck normal   Additional PE well-developed female in NAD. She is status post wide local excision left breast which is healing well. No dominant mass or nodularity is noted in either breast in 2 positions examined. Lungs are clear to A&P cardiac examination shows regular rate and rhythm.   Breasts/Resp/CV/GI/GU:  Respiratory and Thorax normal   Cardiovascular normal   Gastrointestinal normal   Genitourinary normal   MS/Neuro/Psych/Lymph:  Musculoskeletal normal   Neurological normal   Lymphatics normal   Other Results:  Radiology Results: [place]:    07-Aug-15 14:35, Digital Additional Views Lt Breast (SCR)  Digital Additional Views Lt Breast (SCR)   REASON FOR EXAM:    LT BREAST AV FOR ASYMMETRY  COMMENTS:       PROCEDURE: MAM - MAM DGTL ADD VW LT  SCR  - Apr 25 2014  2:35PM     CLINICAL DATA:  Possible left breast asymmetry at recent screening  mammography.    EXAM:  DIGITAL DIAGNOSTIC  LEFT MAMMOGRAM    ULTRASOUND LEFT BREAST    COMPARISON:  Previous  examinations, including the screening  mammogram dated 04/18/2014.  ACR Breast Density Category d: The breast tissue is extremely dense,  which lowers the sensitivity of mammography.    FINDINGS:  Spot compression views of the left breast demonstrate a small,  spiculated mass deep in the upper-outer portion of the breast.    On physical exam, there is an approximately 1.5 cm rounded area of  palpable, mass-like soft tissue thickening in the upper outer left  breast 1:30 o'clock position, 9 cm from the nipple. There are no  palpable left axillary lymph nodes.    Ultrasound is performed, showing a 1.4 x 1.1 x 0.8 cm irregular,  vertically oriented, hypoechoic mass with peripheral increased  echogenicity in the 1:30 o'clock position of the left breast, 9 cm  from the nipple. The ultrasound  images are incorrectly labeled 2:30  o'clock.    Ultrasound of the left axilla demonstrated no abnormal appearing  left axillary lymph nodes.     IMPRESSION:  1.4 cm mass with imaging features highly suspicious for malignancy  in the 1:30 o'clock position of the left breast.    RECOMMENDATION:  Left breast ultrasound-guided core needle biopsy. This will be  arranged in consultation with the patient and her physician.    I have discussed the findings and recommendations with the patient.  Results were also provided in writing at the conclusion of the  visit. If applicable, a reminder letter will be sent to the patient  regarding the next appointment.    BI-RADS CATEGORY  5: Highly suggestive of malignancy.      Electronically Signed    By: Enrique Sack M.D.    On: 04/25/2014 17:13         Verified By: Gerald Stabs, M.D.,   Relevent Results:   Relevant Scans and Labs mammograms reviewed   Assessment and Plan: Impression:   stage I ER/PR positive invasive mammary carcinoma the left breast status post wide local excision and sentinel node biopsy with low Oncotype DX scorein 80 year old female Plan:   at this time I recommended whole breast radiation to her left breast. I would plan on a hypofractionated course of radiation over 4 weeks. Would booster scar another 1600 cGy electron beam based on the close margin of both the invasive and in situ component. Risks and benefits of treatment including skin reaction, fatigue, inclusion of the superficial lung, alteration of blood counts, were discussed in detail with the patient. She seems to comprehend my treatment plan well. She will have discussion this afternoon with medical oncology should her plans changed for chemotherapy would sequence her treatments after chemotherapy is complete. I have otherwise ordered and set her up for CT simulation for next week.  I would like to take this opportunity for allowing me to participate in the  care of your patient..  Fax to Physician:  Physicians To Recieve Fax: DeFrancesco, Alanda Slim, MD - [phone] Rochel Brome, MD - [phone] Deborra Medina, MD - [phone].  Electronic Signatures: Marvella Jenning, Roda Shutters (MD)  (Signed 14-Oct-15 11:53)  Authored: HPI, Diagnosis, Past Hx, PFSH, Allergies, Home Meds, ROS, Nursing Notes, Physical Exam, Other Results, Relevent Results, Encounter Assessment and Plan, Fax to Physician   Last Updated: 14-Oct-15 11:53 by Armstead Peaks (MD)

## 2015-01-11 ENCOUNTER — Other Ambulatory Visit: Payer: Self-pay | Admitting: Internal Medicine

## 2015-01-13 ENCOUNTER — Telehealth: Payer: Self-pay | Admitting: Internal Medicine

## 2015-01-13 ENCOUNTER — Ambulatory Visit
Admit: 2015-01-13 | Disposition: A | Discharge status: Home / Self Care | Payer: Self-pay | Attending: Orthopedic Surgery | Admitting: Orthopedic Surgery

## 2015-01-13 DIAGNOSIS — M4856XA Collapsed vertebra, not elsewhere classified, lumbar region, initial encounter for fracture: Secondary | ICD-10-CM | POA: Diagnosis not present

## 2015-01-13 DIAGNOSIS — Z01812 Encounter for preprocedural laboratory examination: Secondary | ICD-10-CM | POA: Diagnosis not present

## 2015-01-13 LAB — POTASSIUM: Potassium: 4.4 mmol/L

## 2015-01-13 MED ORDER — ENOXAPARIN SODIUM 80 MG/0.8ML ~~LOC~~ SOLN: 1.0000 mg/kg | Freq: Two times a day (BID) | SUBCUTANEOUS | Status: DC

## 2015-01-13 NOTE — Telephone Encounter (Signed)
Certainly not plavix.  She can start lovenox today for 3 doses,  Last dose tomorrow morning since surgery is on 4/28. rx was sent to pharmacy

## 2015-01-13 NOTE — Telephone Encounter (Signed)
Patient notified and voiced understanding patient has taken the Lovenox injections before.

## 2015-01-13 NOTE — Telephone Encounter (Signed)
Patient started a second round of Cipro on 01/12/15 for UTI and is to have Kyroplasty to lumbar region performed on 01/15/15 patient was advised by surgeon to stop coumadin X 5 days prior to surgery, patient stopped coumadin 5 mg on 01/10/15 patient is concerned about going without nblood thinner this long and would like to know should she be on Plavix or Lovenox injection as for previous surgeries she has had.

## 2015-01-15 ENCOUNTER — Ambulatory Visit
Admit: 2015-01-15 | Disposition: A | Discharge status: Home / Self Care | Payer: Self-pay | Attending: Orthopedic Surgery | Admitting: Orthopedic Surgery

## 2015-01-15 ENCOUNTER — Other Ambulatory Visit: Payer: Medicare Other

## 2015-01-15 DIAGNOSIS — Z853 Personal history of malignant neoplasm of breast: Secondary | ICD-10-CM | POA: Diagnosis not present

## 2015-01-15 DIAGNOSIS — Z881 Allergy status to other antibiotic agents status: Secondary | ICD-10-CM | POA: Diagnosis not present

## 2015-01-15 DIAGNOSIS — Z885 Allergy status to narcotic agent status: Secondary | ICD-10-CM | POA: Diagnosis not present

## 2015-01-15 DIAGNOSIS — G629 Polyneuropathy, unspecified: Secondary | ICD-10-CM | POA: Diagnosis not present

## 2015-01-15 DIAGNOSIS — M549 Dorsalgia, unspecified: Secondary | ICD-10-CM | POA: Diagnosis not present

## 2015-01-15 DIAGNOSIS — S32018A Other fracture of first lumbar vertebra, initial encounter for closed fracture: Secondary | ICD-10-CM | POA: Diagnosis not present

## 2015-01-15 DIAGNOSIS — I509 Heart failure, unspecified: Secondary | ICD-10-CM | POA: Diagnosis not present

## 2015-01-15 DIAGNOSIS — Z7901 Long term (current) use of anticoagulants: Secondary | ICD-10-CM | POA: Diagnosis not present

## 2015-01-15 DIAGNOSIS — Z981 Arthrodesis status: Secondary | ICD-10-CM | POA: Diagnosis not present

## 2015-01-15 DIAGNOSIS — S32000A Wedge compression fracture of unspecified lumbar vertebra, initial encounter for closed fracture: Secondary | ICD-10-CM | POA: Diagnosis not present

## 2015-01-15 DIAGNOSIS — E079 Disorder of thyroid, unspecified: Secondary | ICD-10-CM | POA: Diagnosis not present

## 2015-01-15 DIAGNOSIS — K219 Gastro-esophageal reflux disease without esophagitis: Secondary | ICD-10-CM | POA: Diagnosis not present

## 2015-01-15 DIAGNOSIS — I272 Other secondary pulmonary hypertension: Secondary | ICD-10-CM | POA: Diagnosis not present

## 2015-01-15 DIAGNOSIS — Z79899 Other long term (current) drug therapy: Secondary | ICD-10-CM | POA: Diagnosis not present

## 2015-01-15 DIAGNOSIS — I82492 Acute embolism and thrombosis of other specified deep vein of left lower extremity: Secondary | ICD-10-CM | POA: Diagnosis not present

## 2015-01-15 DIAGNOSIS — M4856XA Collapsed vertebra, not elsewhere classified, lumbar region, initial encounter for fracture: Secondary | ICD-10-CM | POA: Diagnosis not present

## 2015-01-15 DIAGNOSIS — Z888 Allergy status to other drugs, medicaments and biological substances status: Secondary | ICD-10-CM | POA: Diagnosis not present

## 2015-01-15 DIAGNOSIS — Z882 Allergy status to sulfonamides status: Secondary | ICD-10-CM | POA: Diagnosis not present

## 2015-01-15 LAB — PROTIME-INR
INR: 1
Prothrombin Time: 13.6 secs

## 2015-01-18 NOTE — Op Note (Signed)
PATIENT NAME:  Diana [name], Diana [name] MR#:  [id number] DATE OF BIRTH:  1934/12/06  DATE OF PROCEDURE:  01/15/2015  PREOPERATIVE DIAGNOSIS: L1 compression fracture.   POSTOPERATIVE DIAGNOSIS: L1 compression fracture.   PROCEDURE: L1 kyphoplasty.   ANESTHESIA: MAC.   SURGEON: Hessie Knows, MD   DESCRIPTION OF PROCEDURE: The patient was brought to the operating room and after adequate anesthesia was obtained, the patient was placed prone. The patient had some scoliosis and visualization was somewhat difficult. After getting good visualization of the L1 vertebral body, patient identification, and timeout procedures were completed, the skin was prepped with alcohol and 5 mL of 1% Xylocaine was infiltrated on the left and right sides in case bilateral approach needed. The back was then prepped and draped in the usual sterile fashion. Repeat timeout procedure with patient identification completed. A spinal needle was used to get local down to the L1 pedicle with a 50:50 mix of 1% Xylocaine and 0.5% Sensorcaine with epinephrine. After allowing this local to set, a small stab incision was made and a trocar advanced under the pedicle and a transpedicular approach was obtained, getting into the right side of the vertebral body but after biopsy was obtained, drilling seemed to go to the midline. Balloon was inflated to 3 mL. Cement was mixed and on filling the vertebral body, approximately 4.5 mL of cement was placed and there was good fill across the midline involving both sides as well as second pedicular approach was not required. After adequate filling without extravasation, trocar was removed and permanent C-arm views obtained. The patient was then sent to the recovery room in stable condition.   ESTIMATED BLOOD LOSS: Minimal.   COMPLICATIONS: None.   SPECIMEN: L1 vertebral body biopsy.    ____________________________ Laurene Footman, MD mjm:TM D: 01/15/2015 20:36:25 ET T: 01/16/2015 00:58:38  ET JOB#: [id number]  cc: Laurene Footman, MD, <Dictator> Christian [facility] MD ELECTRONICALLY SIGNED 01/16/2015 6:50

## 2015-01-20 ENCOUNTER — Other Ambulatory Visit: Payer: Self-pay | Admitting: Internal Medicine

## 2015-01-21 NOTE — Telephone Encounter (Signed)
Last Ov 3.2.16, last refill 9.24.15.  Please advise refill

## 2015-01-22 NOTE — Telephone Encounter (Signed)
Pt aware Rx called in to pharmacy

## 2015-01-22 NOTE — Telephone Encounter (Signed)
Ok to refill /call in

## 2015-01-22 NOTE — Telephone Encounter (Signed)
Rx called into to [facility]

## 2015-01-26 DIAGNOSIS — M329 Systemic lupus erythematosus, unspecified: Secondary | ICD-10-CM | POA: Diagnosis not present

## 2015-01-26 DIAGNOSIS — I5030 Unspecified diastolic (congestive) heart failure: Secondary | ICD-10-CM | POA: Diagnosis not present

## 2015-01-26 DIAGNOSIS — J849 Interstitial pulmonary disease, unspecified: Secondary | ICD-10-CM | POA: Diagnosis not present

## 2015-01-26 DIAGNOSIS — M1990 Unspecified osteoarthritis, unspecified site: Secondary | ICD-10-CM | POA: Diagnosis not present

## 2015-01-26 DIAGNOSIS — L930 Discoid lupus erythematosus: Secondary | ICD-10-CM | POA: Diagnosis not present

## 2015-01-29 DIAGNOSIS — Z9889 Other specified postprocedural states: Secondary | ICD-10-CM | POA: Diagnosis not present

## 2015-02-03 ENCOUNTER — Other Ambulatory Visit: Payer: Self-pay | Admitting: Orthopedic Surgery

## 2015-02-03 DIAGNOSIS — S32020A Wedge compression fracture of second lumbar vertebra, initial encounter for closed fracture: Secondary | ICD-10-CM

## 2015-02-05 ENCOUNTER — Ambulatory Visit
Admission: RE | Admit: 2015-02-05 | Discharge: 2015-02-05 | Disposition: A | Discharge status: Home / Self Care | Payer: Medicare Other | Source: Ambulatory Visit | Attending: Orthopedic Surgery | Admitting: Orthopedic Surgery

## 2015-02-05 DIAGNOSIS — M4856XA Collapsed vertebra, not elsewhere classified, lumbar region, initial encounter for fracture: Secondary | ICD-10-CM | POA: Diagnosis present

## 2015-02-05 DIAGNOSIS — S32020A Wedge compression fracture of second lumbar vertebra, initial encounter for closed fracture: Secondary | ICD-10-CM

## 2015-02-05 DIAGNOSIS — M4185 Other forms of scoliosis, thoracolumbar region: Secondary | ICD-10-CM | POA: Insufficient documentation

## 2015-02-05 DIAGNOSIS — M47896 Other spondylosis, lumbar region: Secondary | ICD-10-CM | POA: Diagnosis not present

## 2015-02-06 ENCOUNTER — Telehealth: Payer: Self-pay | Admitting: Internal Medicine

## 2015-02-06 MED ORDER — ENOXAPARIN SODIUM 80 MG/0.8ML ~~LOC~~ SOLN: 1.0000 mg/kg | Freq: Two times a day (BID) | SUBCUTANEOUS | Status: DC

## 2015-02-06 NOTE — Telephone Encounter (Signed)
Please advise patient has used the Lovenox Patient surgery is scheduled for Tuesday.

## 2015-02-06 NOTE — Telephone Encounter (Signed)
Script sent and patient notified.

## 2015-02-06 NOTE — Telephone Encounter (Signed)
The patient is having surgery done on her back on 5.24.16. She is needing instruction on what she should do about her coumadin. Needing to call surgeon today.

## 2015-02-06 NOTE — Telephone Encounter (Signed)
Stop the coumadin now!   Use Lovenox the day before stop at midnight needs two doses of Lovenox for Monday

## 2015-02-09 LAB — SURGICAL PATHOLOGY

## 2015-02-10 ENCOUNTER — Ambulatory Visit: Payer: Medicare Other

## 2015-02-10 ENCOUNTER — Ambulatory Visit: Payer: Medicare Other | Admitting: Anesthesiology

## 2015-02-10 ENCOUNTER — Encounter
Admission: RE | Disposition: A | Discharge status: Home / Self Care | Payer: Self-pay | Source: Ambulatory Visit | Attending: Orthopedic Surgery

## 2015-02-10 ENCOUNTER — Ambulatory Visit
Admission: RE | Admit: 2015-02-10 | Discharge: 2015-02-10 | Disposition: A | Discharge status: Home / Self Care | Payer: Medicare Other | Source: Ambulatory Visit | Attending: Orthopedic Surgery | Admitting: Orthopedic Surgery

## 2015-02-10 DIAGNOSIS — Z91041 Radiographic dye allergy status: Secondary | ICD-10-CM | POA: Diagnosis not present

## 2015-02-10 DIAGNOSIS — I10 Essential (primary) hypertension: Secondary | ICD-10-CM | POA: Diagnosis not present

## 2015-02-10 DIAGNOSIS — Z79891 Long term (current) use of opiate analgesic: Secondary | ICD-10-CM | POA: Diagnosis not present

## 2015-02-10 DIAGNOSIS — Z888 Allergy status to other drugs, medicaments and biological substances status: Secondary | ICD-10-CM | POA: Insufficient documentation

## 2015-02-10 DIAGNOSIS — E785 Hyperlipidemia, unspecified: Secondary | ICD-10-CM | POA: Insufficient documentation

## 2015-02-10 DIAGNOSIS — Z79899 Other long term (current) drug therapy: Secondary | ICD-10-CM | POA: Insufficient documentation

## 2015-02-10 DIAGNOSIS — Z881 Allergy status to other antibiotic agents status: Secondary | ICD-10-CM | POA: Diagnosis not present

## 2015-02-10 DIAGNOSIS — E0590 Thyrotoxicosis, unspecified without thyrotoxic crisis or storm: Secondary | ICD-10-CM | POA: Diagnosis not present

## 2015-02-10 DIAGNOSIS — D649 Anemia, unspecified: Secondary | ICD-10-CM | POA: Diagnosis not present

## 2015-02-10 DIAGNOSIS — I82402 Acute embolism and thrombosis of unspecified deep veins of left lower extremity: Secondary | ICD-10-CM | POA: Insufficient documentation

## 2015-02-10 DIAGNOSIS — I509 Heart failure, unspecified: Secondary | ICD-10-CM | POA: Insufficient documentation

## 2015-02-10 DIAGNOSIS — S32000A Wedge compression fracture of unspecified lumbar vertebra, initial encounter for closed fracture: Secondary | ICD-10-CM | POA: Diagnosis not present

## 2015-02-10 DIAGNOSIS — K219 Gastro-esophageal reflux disease without esophagitis: Secondary | ICD-10-CM | POA: Insufficient documentation

## 2015-02-10 DIAGNOSIS — Z880 Allergy status to penicillin: Secondary | ICD-10-CM | POA: Diagnosis not present

## 2015-02-10 DIAGNOSIS — I272 Other secondary pulmonary hypertension: Secondary | ICD-10-CM | POA: Insufficient documentation

## 2015-02-10 DIAGNOSIS — Z419 Encounter for procedure for purposes other than remedying health state, unspecified: Secondary | ICD-10-CM

## 2015-02-10 DIAGNOSIS — M4856XA Collapsed vertebra, not elsewhere classified, lumbar region, initial encounter for fracture: Secondary | ICD-10-CM | POA: Insufficient documentation

## 2015-02-10 DIAGNOSIS — Z882 Allergy status to sulfonamides status: Secondary | ICD-10-CM | POA: Insufficient documentation

## 2015-02-10 DIAGNOSIS — Z4889 Encounter for other specified surgical aftercare: Secondary | ICD-10-CM | POA: Diagnosis not present

## 2015-02-10 DIAGNOSIS — M549 Dorsalgia, unspecified: Secondary | ICD-10-CM | POA: Insufficient documentation

## 2015-02-10 HISTORY — PX: KYPHOPLASTY: SHX5884

## 2015-02-10 LAB — PROTIME-INR
INR: 1.06
PROTHROMBIN TIME: 14 s (ref 11.4–15.0)

## 2015-02-10 SURGERY — KYPHOPLASTY
Anesthesia: General

## 2015-02-10 MED ORDER — ACETAMINOPHEN 10 MG/ML IV SOLN
INTRAVENOUS | Status: DC | PRN
  Administered 2015-02-10: 1000 mg via INTRAVENOUS

## 2015-02-10 MED ORDER — LIDOCAINE HCL (PF) 1 % IJ SOLN
INTRAMUSCULAR | Status: AC
  Filled 2015-02-10: qty 2

## 2015-02-10 MED ORDER — ONDANSETRON HCL 4 MG/2ML IJ SOLN: 4.0000 mg | Freq: Once | INTRAMUSCULAR | Status: DC | PRN

## 2015-02-10 MED ORDER — IOHEXOL 180 MG/ML  SOLN
INTRAMUSCULAR | Status: AC
  Filled 2015-02-10: qty 40

## 2015-02-10 MED ORDER — CLINDAMYCIN PHOSPHATE 900 MG/50ML IV SOLN
INTRAVENOUS | Status: AC
  Administered 2015-02-10: 900 mg via INTRAVENOUS
  Filled 2015-02-10: qty 50

## 2015-02-10 MED ORDER — BUPIVACAINE-EPINEPHRINE (PF) 0.5% -1:200000 IJ SOLN
INTRAMUSCULAR | Status: DC | PRN
Start: 2015-02-10 — End: 2015-02-10
  Administered 2015-02-10: 10 mL

## 2015-02-10 MED ORDER — LIDOCAINE HCL 1 % IJ SOLN
INTRAMUSCULAR | Status: DC | PRN
Start: 2015-02-10 — End: 2015-02-10
  Administered 2015-02-10: 20 mL via INTRADERMAL

## 2015-02-10 MED ORDER — LIDOCAINE HCL (PF) 1 % IJ SOLN
INTRAMUSCULAR | Status: AC
  Filled 2015-02-10: qty 60

## 2015-02-10 MED ORDER — PROPOFOL INFUSION 10 MG/ML OPTIME
INTRAVENOUS | Status: DC | PRN
  Administered 2015-02-10: 40 ug/kg/min via INTRAVENOUS

## 2015-02-10 MED ORDER — FENTANYL CITRATE (PF) 100 MCG/2ML IJ SOLN
INTRAMUSCULAR | Status: AC
  Filled 2015-02-10: qty 2

## 2015-02-10 MED ORDER — FENTANYL CITRATE (PF) 100 MCG/2ML IJ SOLN
INTRAMUSCULAR | Status: DC | PRN
  Administered 2015-02-10: 25 ug via INTRAVENOUS

## 2015-02-10 MED ORDER — CLINDAMYCIN PHOSPHATE 900 MG/50ML IV SOLN: 900.0000 mg | INTRAVENOUS | Status: DC

## 2015-02-10 MED ORDER — KETAMINE HCL 50 MG/ML IJ SOLN
INTRAMUSCULAR | Status: DC | PRN
  Administered 2015-02-10: 50 mg via INTRAMUSCULAR

## 2015-02-10 MED ORDER — ACETAMINOPHEN 10 MG/ML IV SOLN
INTRAVENOUS | Status: AC
  Filled 2015-02-10: qty 100

## 2015-02-10 MED ORDER — PROPOFOL 10 MG/ML IV BOLUS
INTRAVENOUS | Status: DC | PRN
  Administered 2015-02-10 (×2): 20 mg via INTRAVENOUS

## 2015-02-10 MED ORDER — MIDAZOLAM HCL 2 MG/2ML IJ SOLN
INTRAMUSCULAR | Status: DC | PRN
  Administered 2015-02-10 (×2): 1 mg via INTRAVENOUS

## 2015-02-10 MED ORDER — BUPIVACAINE-EPINEPHRINE (PF) 0.5% -1:200000 IJ SOLN
INTRAMUSCULAR | Status: AC
  Filled 2015-02-10: qty 30

## 2015-02-10 MED ORDER — FENTANYL CITRATE (PF) 100 MCG/2ML IJ SOLN
25.0000 ug | INTRAMUSCULAR | Status: DC | PRN
  Administered 2015-02-10 (×2): 25 ug via INTRAVENOUS

## 2015-02-10 MED ORDER — CHLORHEXIDINE GLUCONATE 4 % EX LIQD: 60.0000 mL | Freq: Once | CUTANEOUS | Status: DC

## 2015-02-10 MED ORDER — SODIUM CHLORIDE 0.9 % IV SOLN
INTRAVENOUS | Status: DC
  Administered 2015-02-10 (×2): via INTRAVENOUS

## 2015-02-10 SURGICAL SUPPLY — 15 items
CEMENT BONE KYPHON CDS (Cement) ×3 IMPLANT
DEVICE BIOPSY BONE KYPHX (INSTRUMENTS) ×3 IMPLANT
DRAPE C-ARM XRAY 36X54 (DRAPES) ×3 IMPLANT
DURAPREP 26ML APPLICATOR (WOUND CARE) ×3 IMPLANT
GLOVE SURG CUT RESIS 520032000 (GLOVE) ×3 IMPLANT
GLOVE SURG ORTHO 9.0 STRL STRW (GLOVE) ×3 IMPLANT
GOWN SPECIALTY ULTRA XL (MISCELLANEOUS) ×3 IMPLANT
GOWN STRL REUS W/ TWL LRG LVL3 (GOWN DISPOSABLE) ×1 IMPLANT
GOWN STRL REUS W/TWL LRG LVL3 (GOWN DISPOSABLE) ×2
LIQUID BAND (GAUZE/BANDAGES/DRESSINGS) ×3 IMPLANT
NDL SAFETY 18GX1.5 (NEEDLE) ×3 IMPLANT
PACK KYPHOPLASTY (MISCELLANEOUS) ×3 IMPLANT
STRAP SAFETY BODY (MISCELLANEOUS) ×3 IMPLANT
TRAY KYPHOPAK 15/3 EXPRESS 1ST (MISCELLANEOUS) ×3 IMPLANT
TRAY KYPHOPAK 20/3 EXPRESS 1ST (MISCELLANEOUS) ×3 IMPLANT

## 2015-02-10 NOTE — Op Note (Signed)
02/10/2015  2:25 PM  PATIENT:  Diana [name]  80 y.o. female  PRE-OPERATIVE DIAGNOSIS:  L2 COMPRESSION FX.  POST-OPERATIVE DIAGNOSIS:  same  PROCEDURE:  Procedure(s): KYPHOPLASTY/L2 (N/A)  SURGEON: Laurene Footman, MD  ASSISTANTS: None  ANESTHESIA:   MAC  EBL:  Total I/O In: 500 [I.V.:500] Out: -   BLOOD ADMINISTERED:none  DRAINS: none   LOCAL MEDICATIONS USED:  MARCAINE    and XYLOCAINE   SPECIMEN:  Source of Specimen:  L2 vertebral body  DISPOSITION OF SPECIMEN:  PATHOLOGY  COUNTS:  YES  TOURNIQUET:  * No tourniquets in log *  IMPLANTS: Bone cement  DICTATION: .Dragon Dictation patient brought the operating room and after adequate anesthesia was obtained patient was placed prone. AP and lateral imaging of L2 were obtained. The back was prepped with alcohol after patient medication timeout procedures were completed 10 cc of 1% Xylocaine was infiltrated subcutaneously on the left and right sides for initial local. The back was then prepped and draped in the sterile fashion and repeat timeout procedure completed a spinal needle was used on the right side getting down to the L2 pedicle on the right side. A mixture of Xylocaine with Sensorcaine with epinephrine was infiltrated along the tract and allowed to set. Small stab incision was made and a trocar advanced into the L2 vertebral body and biopsy obtained care was taken on advancing the trocar to stay out of the neural foramen and canal. After biopsy was obtained drilling was carried out and a balloon inserted and inflated to 4 cc. Cement was mixed and when it is the appropriate consistency the vertebral body was filled with appropriate interdigitation of the bone cement. Trochars removed and permanent C-arm views were obtained. Wound was closed with Dermabond  PLAN OF CARE: Discharge to home after PACU  PATIENT DISPOSITION:  PACU - hemodynamically stable.

## 2015-02-10 NOTE — Discharge Instructions (Signed)
Activity as tolerated. Okay to remove Band-Aid on Thursday, okay to shower after that.General Anesthesia, Care After Refer to this sheet in the next few weeks. These instructions provide you with information on caring for yourself after your procedure. Your health care provider may also give you more specific instructions. Your treatment has been planned according to current medical practices, but problems sometimes occur. Call your health care provider if you have any problems or questions after your procedure. WHAT TO EXPECT AFTER THE PROCEDURE After the procedure, it is typical to experience:  Sleepiness.  Nausea and vomiting. HOME CARE INSTRUCTIONS  For the first 24 hours after general anesthesia:  Have a responsible person with you.  Do not drive a car. If you are alone, do not take public transportation.  Do not drink alcohol.  Do not take medicine that has not been prescribed by your health care provider.  Do not sign important papers or make important decisions.  You may resume a normal diet and activities as directed by your health care provider.  Change bandages (dressings) as directed.  If you have questions or problems that seem related to general anesthesia, call the hospital and ask for the anesthetist or anesthesiologist on call. SEEK MEDICAL CARE IF:  You have nausea and vomiting that continue the day after anesthesia.  You develop a rash. SEEK IMMEDIATE MEDICAL CARE IF:   You have difficulty breathing.  You have chest pain.  You have any allergic problems. Document Released: 12/12/2000 Document Revised: 09/10/2013 Document Reviewed: 03/21/2013 [facility] Patient Information 2015 [place], [place]. This information is not intended to replace advice given to you by your health care provider. Make sure you discuss any questions you have with your health care provider.

## 2015-02-10 NOTE — Anesthesia Procedure Notes (Signed)
Date/Time: 02/10/2015 1:30 PM Performed by: Nelda Marseille Pre-anesthesia Checklist: Patient identified, Emergency Drugs available, Suction available, Patient being monitored and Timeout performed Patient Re-evaluated:Patient Re-evaluated prior to inductionOxygen Delivery Method: Nasal cannula

## 2015-02-10 NOTE — Anesthesia Preprocedure Evaluation (Signed)
Anesthesia Evaluation  Patient identified by MRN, date of birth, ID band Patient awake    Reviewed: Allergy & Precautions, NPO status , Patient's Chart, lab work & pertinent test results  Airway Mallampati: II  TM Distance: >3 FB Neck ROM: Full    Dental  (+) Partial Upper   Pulmonary shortness of breath (with PHTN) and with exertion, former smoker (quit 20 yrs ago),          Cardiovascular + Peripheral Vascular Disease + dysrhythmias (rapid heart beat on metoprolol) + Valvular Problems/Murmurs (primary pulmonary HTN)     Neuro/Psych    GI/Hepatic hiatal hernia, GERD-  Medicated and Controlled,  Endo/Other  diabetes (pt denies)Hypothyroidism   Renal/GU      Musculoskeletal   Abdominal   Peds  Hematology  (+) anemia ,   Anesthesia Other Findings   Reproductive/Obstetrics                             Anesthesia Physical Anesthesia Plan  ASA: III  Anesthesia Plan: General   Post-op Pain Management:    Induction: Intravenous  Airway Management Planned: Nasal Cannula  Additional Equipment:   Intra-op Plan:   Post-operative Plan:   Informed Consent: I have reviewed the patients History and Physical, chart, labs and discussed the procedure including the risks, benefits and alternatives for the proposed anesthesia with the patient or authorized representative who has indicated his/her understanding and acceptance.     Plan Discussed with:   Anesthesia Plan Comments:         Anesthesia Quick Evaluation

## 2015-02-10 NOTE — Anesthesia Postprocedure Evaluation (Signed)
  Anesthesia Post-op Note  Patient: [name]  Procedure(s) Performed: Procedure(s): KYPHOPLASTY/L2 (N/A)  Anesthesia type:General  Patient location: PACU  Post pain: Pain level controlled  Post assessment: Post-op Vital signs reviewed, Patient's Cardiovascular Status Stable, Respiratory Function Stable, Patent Airway and No signs of Nausea or vomiting  Post vital signs: Reviewed and stable  Last Vitals:  Filed Vitals:   02/10/15 1230  BP: 125/79  Pulse: 86  Temp: 36.9 C  Resp: 16    Level of consciousness: awake, alert  and patient cooperative  Complications: No apparent anesthesia complications

## 2015-02-10 NOTE — H&P (Signed)
Reviewed paper H+P, will be scanned into chart. No changes noted.  

## 2015-02-10 NOTE — Transfer of Care (Signed)
Immediate Anesthesia Transfer of Care Note  Patient: Diana [name]  Procedure(s) Performed: Procedure(s): KYPHOPLASTY/L2 (N/A)  Patient Location: PACU  Anesthesia Type:General  Level of Consciousness: awake, alert  and oriented  Airway & Oxygen Therapy: Patient connected to nasal cannula oxygen  Post-op Assessment: Report given to RN and Post -op Vital signs reviewed and stable  Post vital signs: stable  Last Vitals:  Filed Vitals:   02/10/15 1230  BP: 125/79  Pulse: 86  Temp: 36.9 C  Resp: 16    Complications: No apparent anesthesia complications

## 2015-02-11 ENCOUNTER — Encounter: Payer: Self-pay | Admitting: Orthopedic Surgery

## 2015-02-11 ENCOUNTER — Ambulatory Visit: Payer: Medicare Other

## 2015-02-13 LAB — SURGICAL PATHOLOGY

## 2015-02-18 ENCOUNTER — Ambulatory Visit: Payer: Medicare Other | Admitting: Anesthesiology

## 2015-02-23 DIAGNOSIS — G5762 Lesion of plantar nerve, left lower limb: Secondary | ICD-10-CM | POA: Diagnosis not present

## 2015-02-25 DIAGNOSIS — Z9889 Other specified postprocedural states: Secondary | ICD-10-CM | POA: Diagnosis not present

## 2015-02-27 DIAGNOSIS — M3212 Pericarditis in systemic lupus erythematosus: Secondary | ICD-10-CM | POA: Diagnosis not present

## 2015-02-27 DIAGNOSIS — R0602 Shortness of breath: Secondary | ICD-10-CM | POA: Diagnosis not present

## 2015-02-27 DIAGNOSIS — J0191 Acute recurrent sinusitis, unspecified: Secondary | ICD-10-CM | POA: Diagnosis not present

## 2015-03-10 ENCOUNTER — Encounter: Payer: Self-pay | Admitting: Urgent Care

## 2015-03-10 ENCOUNTER — Emergency Department: Payer: Medicare Other

## 2015-03-10 ENCOUNTER — Inpatient Hospital Stay
Admission: EM | Admit: 2015-03-10 | Discharge: 2015-03-20 | DRG: 327 | Disposition: A | Discharge status: Home / Self Care | Payer: Medicare Other | Attending: General Surgery | Admitting: General Surgery

## 2015-03-10 DIAGNOSIS — E119 Type 2 diabetes mellitus without complications: Secondary | ICD-10-CM

## 2015-03-10 DIAGNOSIS — I509 Heart failure, unspecified: Secondary | ICD-10-CM | POA: Diagnosis not present

## 2015-03-10 DIAGNOSIS — M5136 Other intervertebral disc degeneration, lumbar region: Secondary | ICD-10-CM | POA: Diagnosis present

## 2015-03-10 DIAGNOSIS — F419 Anxiety disorder, unspecified: Secondary | ICD-10-CM | POA: Diagnosis present

## 2015-03-10 DIAGNOSIS — M419 Scoliosis, unspecified: Secondary | ICD-10-CM | POA: Diagnosis present

## 2015-03-10 DIAGNOSIS — Z853 Personal history of malignant neoplasm of breast: Secondary | ICD-10-CM

## 2015-03-10 DIAGNOSIS — Z882 Allergy status to sulfonamides status: Secondary | ICD-10-CM

## 2015-03-10 DIAGNOSIS — B0223 Postherpetic polyneuropathy: Secondary | ICD-10-CM | POA: Diagnosis not present

## 2015-03-10 DIAGNOSIS — Z7901 Long term (current) use of anticoagulants: Secondary | ICD-10-CM

## 2015-03-10 DIAGNOSIS — R079 Chest pain, unspecified: Secondary | ICD-10-CM | POA: Diagnosis not present

## 2015-03-10 DIAGNOSIS — I270 Primary pulmonary hypertension: Secondary | ICD-10-CM | POA: Diagnosis present

## 2015-03-10 DIAGNOSIS — N189 Chronic kidney disease, unspecified: Secondary | ICD-10-CM | POA: Diagnosis not present

## 2015-03-10 DIAGNOSIS — K449 Diaphragmatic hernia without obstruction or gangrene: Secondary | ICD-10-CM | POA: Diagnosis present

## 2015-03-10 DIAGNOSIS — Z87891 Personal history of nicotine dependence: Secondary | ICD-10-CM

## 2015-03-10 DIAGNOSIS — I272 Pulmonary hypertension, unspecified: Secondary | ICD-10-CM | POA: Diagnosis present

## 2015-03-10 DIAGNOSIS — K3189 Other diseases of stomach and duodenum: Secondary | ICD-10-CM | POA: Diagnosis not present

## 2015-03-10 DIAGNOSIS — M329 Systemic lupus erythematosus, unspecified: Secondary | ICD-10-CM | POA: Diagnosis not present

## 2015-03-10 DIAGNOSIS — R1011 Right upper quadrant pain: Secondary | ICD-10-CM

## 2015-03-10 DIAGNOSIS — N958 Other specified menopausal and perimenopausal disorders: Secondary | ICD-10-CM | POA: Diagnosis present

## 2015-03-10 DIAGNOSIS — Z86711 Personal history of pulmonary embolism: Secondary | ICD-10-CM

## 2015-03-10 DIAGNOSIS — N3289 Other specified disorders of bladder: Secondary | ICD-10-CM | POA: Diagnosis not present

## 2015-03-10 DIAGNOSIS — K562 Volvulus: Secondary | ICD-10-CM | POA: Diagnosis not present

## 2015-03-10 DIAGNOSIS — Z886 Allergy status to analgesic agent status: Secondary | ICD-10-CM

## 2015-03-10 DIAGNOSIS — R52 Pain, unspecified: Secondary | ICD-10-CM

## 2015-03-10 DIAGNOSIS — E039 Hypothyroidism, unspecified: Secondary | ICD-10-CM | POA: Diagnosis present

## 2015-03-10 DIAGNOSIS — K219 Gastro-esophageal reflux disease without esophagitis: Secondary | ICD-10-CM | POA: Diagnosis present

## 2015-03-10 DIAGNOSIS — D62 Acute posthemorrhagic anemia: Secondary | ICD-10-CM | POA: Diagnosis not present

## 2015-03-10 DIAGNOSIS — Z9889 Other specified postprocedural states: Secondary | ICD-10-CM

## 2015-03-10 DIAGNOSIS — M3213 Lung involvement in systemic lupus erythematosus: Secondary | ICD-10-CM | POA: Diagnosis present

## 2015-03-10 DIAGNOSIS — Z86718 Personal history of other venous thrombosis and embolism: Secondary | ICD-10-CM

## 2015-03-10 DIAGNOSIS — Z881 Allergy status to other antibiotic agents status: Secondary | ICD-10-CM

## 2015-03-10 DIAGNOSIS — J9811 Atelectasis: Secondary | ICD-10-CM | POA: Diagnosis not present

## 2015-03-10 HISTORY — DX: Malignant (primary) neoplasm, unspecified: C80.1

## 2015-03-10 HISTORY — DX: Diaphragmatic hernia without obstruction or gangrene: K44.9

## 2015-03-10 HISTORY — DX: Acute embolism and thrombosis of unspecified deep veins of unspecified lower extremity: I82.409

## 2015-03-10 MED ORDER — ONDANSETRON 4 MG PO TBDP
4.0000 mg | ORAL_TABLET | Freq: Once | ORAL | Status: AC
  Administered 2015-03-10: 4 mg via ORAL

## 2015-03-10 MED ORDER — ONDANSETRON 4 MG PO TBDP
ORAL_TABLET | ORAL | Status: AC
  Administered 2015-03-10: 4 mg via ORAL
  Filled 2015-03-10: qty 1

## 2015-03-10 NOTE — ED Notes (Signed)
Patient presents with c/o generalized CP that began after dinner tonight; patient was at rest when symptoms started. (+) diffuse abd pain as well. Patient with SOB and N/V; actively vomiting in triage; Zofran 4mg  ODT was given.

## 2015-03-11 ENCOUNTER — Inpatient Hospital Stay: Admit: 2015-03-11 | Payer: Self-pay | Admitting: General Surgery

## 2015-03-11 ENCOUNTER — Emergency Department: Payer: Medicare Other | Admitting: Anesthesiology

## 2015-03-11 ENCOUNTER — Encounter
Admission: EM | Disposition: A | Discharge status: Home / Self Care | Payer: Self-pay | Source: Home / Self Care | Attending: General Surgery

## 2015-03-11 ENCOUNTER — Emergency Department: Payer: Medicare Other

## 2015-03-11 ENCOUNTER — Inpatient Hospital Stay: Payer: Medicare Other

## 2015-03-11 ENCOUNTER — Encounter: Payer: Self-pay | Admitting: Emergency Medicine

## 2015-03-11 DIAGNOSIS — Z87891 Personal history of nicotine dependence: Secondary | ICD-10-CM | POA: Diagnosis not present

## 2015-03-11 DIAGNOSIS — Z853 Personal history of malignant neoplasm of breast: Secondary | ICD-10-CM | POA: Diagnosis not present

## 2015-03-11 DIAGNOSIS — R079 Chest pain, unspecified: Secondary | ICD-10-CM | POA: Diagnosis not present

## 2015-03-11 DIAGNOSIS — I272 Other secondary pulmonary hypertension: Secondary | ICD-10-CM | POA: Diagnosis not present

## 2015-03-11 DIAGNOSIS — N189 Chronic kidney disease, unspecified: Secondary | ICD-10-CM | POA: Diagnosis not present

## 2015-03-11 DIAGNOSIS — K3189 Other diseases of stomach and duodenum: Secondary | ICD-10-CM | POA: Diagnosis present

## 2015-03-11 DIAGNOSIS — Z9889 Other specified postprocedural states: Secondary | ICD-10-CM | POA: Diagnosis not present

## 2015-03-11 DIAGNOSIS — K562 Volvulus: Secondary | ICD-10-CM | POA: Diagnosis not present

## 2015-03-11 DIAGNOSIS — D62 Acute posthemorrhagic anemia: Secondary | ICD-10-CM | POA: Diagnosis not present

## 2015-03-11 DIAGNOSIS — M419 Scoliosis, unspecified: Secondary | ICD-10-CM | POA: Diagnosis present

## 2015-03-11 DIAGNOSIS — R110 Nausea: Secondary | ICD-10-CM | POA: Diagnosis not present

## 2015-03-11 DIAGNOSIS — R918 Other nonspecific abnormal finding of lung field: Secondary | ICD-10-CM | POA: Diagnosis not present

## 2015-03-11 DIAGNOSIS — Z881 Allergy status to other antibiotic agents status: Secondary | ICD-10-CM | POA: Diagnosis not present

## 2015-03-11 DIAGNOSIS — Z882 Allergy status to sulfonamides status: Secondary | ICD-10-CM | POA: Diagnosis not present

## 2015-03-11 DIAGNOSIS — M329 Systemic lupus erythematosus, unspecified: Secondary | ICD-10-CM | POA: Diagnosis present

## 2015-03-11 DIAGNOSIS — E039 Hypothyroidism, unspecified: Secondary | ICD-10-CM | POA: Diagnosis present

## 2015-03-11 DIAGNOSIS — N958 Other specified menopausal and perimenopausal disorders: Secondary | ICD-10-CM | POA: Diagnosis present

## 2015-03-11 DIAGNOSIS — M5136 Other intervertebral disc degeneration, lumbar region: Secondary | ICD-10-CM | POA: Diagnosis present

## 2015-03-11 DIAGNOSIS — Z886 Allergy status to analgesic agent status: Secondary | ICD-10-CM | POA: Diagnosis not present

## 2015-03-11 DIAGNOSIS — J9811 Atelectasis: Secondary | ICD-10-CM | POA: Diagnosis not present

## 2015-03-11 DIAGNOSIS — I270 Primary pulmonary hypertension: Secondary | ICD-10-CM | POA: Diagnosis present

## 2015-03-11 DIAGNOSIS — J90 Pleural effusion, not elsewhere classified: Secondary | ICD-10-CM | POA: Diagnosis not present

## 2015-03-11 DIAGNOSIS — B0223 Postherpetic polyneuropathy: Secondary | ICD-10-CM | POA: Diagnosis present

## 2015-03-11 DIAGNOSIS — Z86711 Personal history of pulmonary embolism: Secondary | ICD-10-CM | POA: Diagnosis not present

## 2015-03-11 DIAGNOSIS — E119 Type 2 diabetes mellitus without complications: Secondary | ICD-10-CM | POA: Diagnosis present

## 2015-03-11 DIAGNOSIS — F419 Anxiety disorder, unspecified: Secondary | ICD-10-CM | POA: Diagnosis present

## 2015-03-11 DIAGNOSIS — I509 Heart failure, unspecified: Secondary | ICD-10-CM | POA: Diagnosis not present

## 2015-03-11 DIAGNOSIS — Z7901 Long term (current) use of anticoagulants: Secondary | ICD-10-CM | POA: Diagnosis not present

## 2015-03-11 DIAGNOSIS — K449 Diaphragmatic hernia without obstruction or gangrene: Secondary | ICD-10-CM | POA: Diagnosis present

## 2015-03-11 DIAGNOSIS — R933 Abnormal findings on diagnostic imaging of other parts of digestive tract: Secondary | ICD-10-CM | POA: Diagnosis not present

## 2015-03-11 DIAGNOSIS — Z86718 Personal history of other venous thrombosis and embolism: Secondary | ICD-10-CM | POA: Diagnosis not present

## 2015-03-11 DIAGNOSIS — N3289 Other specified disorders of bladder: Secondary | ICD-10-CM | POA: Diagnosis not present

## 2015-03-11 DIAGNOSIS — K219 Gastro-esophageal reflux disease without esophagitis: Secondary | ICD-10-CM | POA: Diagnosis present

## 2015-03-11 DIAGNOSIS — M1990 Unspecified osteoarthritis, unspecified site: Secondary | ICD-10-CM | POA: Diagnosis not present

## 2015-03-11 DIAGNOSIS — D7881 Other intraoperative complications of the spleen: Secondary | ICD-10-CM | POA: Diagnosis not present

## 2015-03-11 HISTORY — PX: LAPAROTOMY: SHX154

## 2015-03-11 LAB — BASIC METABOLIC PANEL
Anion gap: 14 (ref 5–15)
BUN: 23 mg/dL — AB (ref 6–20)
CO2: 24 mmol/L (ref 22–32)
CREATININE: 1.33 mg/dL — AB (ref 0.44–1.00)
Calcium: 9.7 mg/dL (ref 8.9–10.3)
Chloride: 106 mmol/L (ref 101–111)
GFR calc non Af Amer: 37 mL/min — ABNORMAL LOW (ref >60)
GFR, EST AFRICAN AMERICAN: 43 mL/min — AB (ref >60)
GLUCOSE: 152 mg/dL — AB (ref 65–99)
Potassium: 3.9 mmol/L (ref 3.5–5.1)
Sodium: 144 mmol/L (ref 135–145)

## 2015-03-11 LAB — CBC
HEMATOCRIT: 37.8 % (ref 35.0–47.0)
HEMOGLOBIN: 12.1 g/dL (ref 12.0–16.0)
MCH: 29.9 pg (ref 26.0–34.0)
MCHC: 31.9 g/dL — AB (ref 32.0–36.0)
MCV: 93.7 fL (ref 80.0–100.0)
Platelets: 251 10*3/uL (ref 150–440)
RBC: 4.04 MIL/uL (ref 3.80–5.20)
RDW: 16.6 % — AB (ref 11.5–14.5)
WBC: 4.8 10*3/uL (ref 3.6–11.0)

## 2015-03-11 LAB — ABO/RH
ABO/RH(D): O POS
ABO/RH(D): O POS

## 2015-03-11 LAB — PREPARE RBC (CROSSMATCH)

## 2015-03-11 LAB — MRSA PCR SCREENING: MRSA by PCR: NEGATIVE

## 2015-03-11 LAB — TROPONIN I: Troponin I: <0.03 ng/mL (ref <0.031)

## 2015-03-11 LAB — LIPASE, BLOOD: Lipase: 25 U/L (ref 22–51)

## 2015-03-11 LAB — PROTIME-INR
INR: 2.08
INR: 1.8
Prothrombin Time: 23.5 seconds — ABNORMAL HIGH (ref 11.4–15.0)
Prothrombin Time: 21.1 seconds — ABNORMAL HIGH (ref 11.4–15.0)

## 2015-03-11 SURGERY — LAPAROTOMY, EXPLORATORY
Anesthesia: General | Wound class: Clean Contaminated

## 2015-03-11 MED ORDER — MORPHINE SULFATE 4 MG/ML IJ SOLN
4.0000 mg | Freq: Once | INTRAMUSCULAR | Status: AC
  Administered 2015-03-11: 4 mg via INTRAVENOUS

## 2015-03-11 MED ORDER — LORAZEPAM 2 MG/ML IJ SOLN: 0.5000 mg | Freq: Four times a day (QID) | INTRAMUSCULAR | Status: DC | PRN

## 2015-03-11 MED ORDER — ONDANSETRON HCL 4 MG/2ML IJ SOLN
4.0000 mg | Freq: Once | INTRAMUSCULAR | Status: AC
  Administered 2015-03-11: 4 mg via INTRAVENOUS

## 2015-03-11 MED ORDER — SODIUM CHLORIDE 0.9 % IJ SOLN
INTRAMUSCULAR | Status: AC
  Filled 2015-03-11: qty 10

## 2015-03-11 MED ORDER — MORPHINE SULFATE 1 MG/ML IV SOLN
INTRAVENOUS | Status: DC
  Administered 2015-03-11: 12:00:00 via INTRAVENOUS
  Administered 2015-03-12: 0 mg via INTRAVENOUS
  Administered 2015-03-12: 3 mL via INTRAVENOUS
  Administered 2015-03-12: 0 mL via INTRAVENOUS
  Administered 2015-03-12: 12:00:00 via INTRAVENOUS
  Administered 2015-03-13: 1.5 mg via INTRAVENOUS
  Administered 2015-03-13 (×2): 3 mg via INTRAVENOUS
  Administered 2015-03-13: 1.5 mg via INTRAVENOUS
  Administered 2015-03-13: 0 mL via INTRAVENOUS
  Administered 2015-03-13: 0 mg via INTRAVENOUS
  Administered 2015-03-14 (×2): 3 mg via INTRAVENOUS
  Administered 2015-03-14 (×2): 1.5 mg via INTRAVENOUS
  Administered 2015-03-14: 3 mg via INTRAVENOUS
  Administered 2015-03-15: 0 mg via INTRAVENOUS
  Administered 2015-03-16: 0 mL via INTRAVENOUS
  Administered 2015-03-16: 1.5 mg via INTRAVENOUS
  Administered 2015-03-16 – 2015-03-17 (×2): 0 mg via INTRAVENOUS
  Administered 2015-03-17 (×2): 1.5 mg via INTRAVENOUS
  Filled 2015-03-11 (×3): qty 25

## 2015-03-11 MED ORDER — PROPOFOL 10 MG/ML IV BOLUS
INTRAVENOUS | Status: DC | PRN
  Administered 2015-03-11: 100 mg via INTRAVENOUS

## 2015-03-11 MED ORDER — HYDROCORTISONE NA SUCCINATE PF 100 MG IJ SOLR
INTRAMUSCULAR | Status: DC | PRN
  Administered 2015-03-11: 125 mg via INTRAVENOUS

## 2015-03-11 MED ORDER — LACTATED RINGERS IV SOLN
INTRAVENOUS | Status: DC
  Administered 2015-03-11 – 2015-03-16 (×11): via INTRAVENOUS
  Administered 2015-03-17: 1000 mL via INTRAVENOUS
  Administered 2015-03-18: 06:00:00 via INTRAVENOUS

## 2015-03-11 MED ORDER — PROMETHAZINE HCL 25 MG/ML IJ SOLN
INTRAMUSCULAR | Status: AC
  Administered 2015-03-11: 12.5 mg via INTRAVENOUS
  Filled 2015-03-11: qty 1

## 2015-03-11 MED ORDER — LEVOTHYROXINE SODIUM 100 MCG IV SOLR: 50.0000 ug | Freq: Every day | INTRAVENOUS | Status: DC

## 2015-03-11 MED ORDER — ONDANSETRON HCL 4 MG/2ML IJ SOLN
4.0000 mg | Freq: Four times a day (QID) | INTRAMUSCULAR | Status: DC | PRN
  Administered 2015-03-14 – 2015-03-16 (×3): 4 mg via INTRAVENOUS
  Filled 2015-03-11 (×3): qty 2

## 2015-03-11 MED ORDER — HYDROMORPHONE HCL 1 MG/ML IJ SOLN
INTRAMUSCULAR | Status: AC
  Filled 2015-03-11: qty 1

## 2015-03-11 MED ORDER — LIDOCAINE HCL (CARDIAC) 20 MG/ML IV SOLN
INTRAVENOUS | Status: DC | PRN
  Administered 2015-03-11: 100 mg via INTRAVENOUS

## 2015-03-11 MED ORDER — CEFAZOLIN SODIUM-DEXTROSE 2-3 GM-% IV SOLR
2.0000 g | Freq: Three times a day (TID) | INTRAVENOUS | Status: DC
  Administered 2015-03-11: 2 g via INTRAVENOUS
  Filled 2015-03-11 (×6): qty 50

## 2015-03-11 MED ORDER — FLUTICASONE PROPIONATE 50 MCG/ACT NA SUSP
2.0000 | Freq: Every day | NASAL | Status: DC
  Administered 2015-03-11 – 2015-03-20 (×10): 2 via NASAL
  Filled 2015-03-11: qty 16

## 2015-03-11 MED ORDER — SODIUM CHLORIDE 0.9 % IV SOLN
Freq: Once | INTRAVENOUS | Status: AC
  Administered 2015-03-11: 09:00:00 via INTRAVENOUS

## 2015-03-11 MED ORDER — HYDROMORPHONE HCL 1 MG/ML IJ SOLN
0.2500 mg | INTRAMUSCULAR | Status: DC | PRN
  Administered 2015-03-11 (×4): 0.5 mg via INTRAVENOUS

## 2015-03-11 MED ORDER — FENTANYL CITRATE (PF) 100 MCG/2ML IJ SOLN
INTRAMUSCULAR | Status: DC | PRN
  Administered 2015-03-11: 150 ug via INTRAVENOUS
  Administered 2015-03-11: 100 ug via INTRAVENOUS

## 2015-03-11 MED ORDER — ONDANSETRON HCL 4 MG/2ML IJ SOLN
INTRAMUSCULAR | Status: AC
  Administered 2015-03-11: 4 mg via INTRAVENOUS
  Filled 2015-03-11: qty 4

## 2015-03-11 MED ORDER — DIPHENHYDRAMINE HCL 12.5 MG/5ML PO ELIX: 12.5000 mg | ORAL_SOLUTION | Freq: Four times a day (QID) | ORAL | Status: DC | PRN

## 2015-03-11 MED ORDER — MORPHINE SULFATE 4 MG/ML IJ SOLN: 4.0000 mg | Freq: Once | INTRAMUSCULAR | Status: DC

## 2015-03-11 MED ORDER — ACETAMINOPHEN 10 MG/ML IV SOLN
INTRAVENOUS | Status: AC
  Filled 2015-03-11: qty 100

## 2015-03-11 MED ORDER — PHENYLEPHRINE HCL 10 MG/ML IJ SOLN
INTRAMUSCULAR | Status: DC | PRN
  Administered 2015-03-11: 200 ug via INTRAVENOUS

## 2015-03-11 MED ORDER — SODIUM CHLORIDE 0.9 % IJ SOLN: 9.0000 mL | INTRAMUSCULAR | Status: DC | PRN

## 2015-03-11 MED ORDER — FAMOTIDINE IN NACL 20-0.9 MG/50ML-% IV SOLN
20.0000 mg | Freq: Two times a day (BID) | INTRAVENOUS | Status: DC
  Administered 2015-03-11 – 2015-03-12 (×4): 20 mg via INTRAVENOUS
  Filled 2015-03-11 (×8): qty 50

## 2015-03-11 MED ORDER — MORPHINE SULFATE 4 MG/ML IJ SOLN
INTRAMUSCULAR | Status: AC
  Administered 2015-03-11: 4 mg via INTRAVENOUS
  Filled 2015-03-11: qty 1

## 2015-03-11 MED ORDER — PROMETHAZINE HCL 25 MG/ML IJ SOLN
12.5000 mg | Freq: Once | INTRAMUSCULAR | Status: AC
Start: 2015-03-11 — End: 2015-03-11
  Administered 2015-03-11: 12.5 mg via INTRAVENOUS

## 2015-03-11 MED ORDER — HYDROMORPHONE HCL 1 MG/ML IJ SOLN
INTRAMUSCULAR | Status: DC | PRN
  Administered 2015-03-11 (×2): 1 mg via INTRAVENOUS

## 2015-03-11 MED ORDER — SODIUM CHLORIDE 0.9 % IR SOLN
Status: DC | PRN
  Administered 2015-03-11: 1200 mL

## 2015-03-11 MED ORDER — FENTANYL CITRATE (PF) 100 MCG/2ML IJ SOLN
INTRAMUSCULAR | Status: AC
  Filled 2015-03-11: qty 2

## 2015-03-11 MED ORDER — GLYCOPYRROLATE 0.2 MG/ML IJ SOLN
INTRAMUSCULAR | Status: DC | PRN
  Administered 2015-03-11: .5 mg via INTRAVENOUS

## 2015-03-11 MED ORDER — SODIUM CHLORIDE 0.9 % IV SOLN
8.0000 mg | Freq: Once | INTRAVENOUS | Status: DC
  Filled 2015-03-11: qty 4

## 2015-03-11 MED ORDER — BUPIVACAINE HCL (PF) 0.5 % IJ SOLN
INTRAMUSCULAR | Status: AC
  Filled 2015-03-11: qty 30

## 2015-03-11 MED ORDER — BUPIVACAINE HCL (PF) 0.5 % IJ SOLN
INTRAMUSCULAR | Status: DC | PRN
  Administered 2015-03-11: 30 mL

## 2015-03-11 MED ORDER — NALOXONE HCL 0.4 MG/ML IJ SOLN: 0.4000 mg | INTRAMUSCULAR | Status: DC | PRN

## 2015-03-11 MED ORDER — ACETAMINOPHEN 10 MG/ML IV SOLN
1000.0000 mg | Freq: Four times a day (QID) | INTRAVENOUS | Status: AC
  Administered 2015-03-11 – 2015-03-12 (×4): 1000 mg via INTRAVENOUS
  Filled 2015-03-11 (×5): qty 100

## 2015-03-11 MED ORDER — METHYLPREDNISOLONE SODIUM SUCC 40 MG IJ SOLR
10.0000 mg | Freq: Two times a day (BID) | INTRAMUSCULAR | Status: DC
  Administered 2015-03-11 – 2015-03-16 (×9): 10 mg via INTRAVENOUS
  Filled 2015-03-11 (×11): qty 1

## 2015-03-11 MED ORDER — DIPHENHYDRAMINE HCL 50 MG/ML IJ SOLN: 12.5000 mg | Freq: Four times a day (QID) | INTRAMUSCULAR | Status: DC | PRN

## 2015-03-11 MED ORDER — ONDANSETRON HCL 4 MG/2ML IJ SOLN
INTRAMUSCULAR | Status: DC | PRN
  Administered 2015-03-11: 4 mg via INTRAVENOUS

## 2015-03-11 MED ORDER — LEVOTHYROXINE SODIUM 100 MCG IV SOLR
75.0000 ug | Freq: Every day | INTRAVENOUS | Status: DC
  Administered 2015-03-11 – 2015-03-18 (×8): 75 ug via INTRAVENOUS
  Filled 2015-03-11 (×10): qty 5

## 2015-03-11 MED ORDER — NEOSTIGMINE METHYLSULFATE 10 MG/10ML IV SOLN
INTRAVENOUS | Status: DC | PRN
  Administered 2015-03-11: 3 mg via INTRAVENOUS

## 2015-03-11 MED ORDER — SUCCINYLCHOLINE CHLORIDE 20 MG/ML IJ SOLN
INTRAMUSCULAR | Status: DC | PRN
  Administered 2015-03-11: 100 mg via INTRAVENOUS

## 2015-03-11 MED ORDER — ACETAMINOPHEN 10 MG/ML IV SOLN
INTRAVENOUS | Status: DC | PRN
Start: 2015-03-11 — End: 2015-03-11
  Administered 2015-03-11: 1000 mg via INTRAVENOUS

## 2015-03-11 MED ORDER — ONDANSETRON HCL 4 MG/2ML IJ SOLN: 4.0000 mg | Freq: Once | INTRAMUSCULAR | Status: DC | PRN

## 2015-03-11 MED ORDER — FENTANYL CITRATE (PF) 100 MCG/2ML IJ SOLN
25.0000 ug | INTRAMUSCULAR | Status: DC | PRN
  Administered 2015-03-11 (×4): 25 ug via INTRAVENOUS

## 2015-03-11 MED ORDER — ROCURONIUM BROMIDE 100 MG/10ML IV SOLN
INTRAVENOUS | Status: DC | PRN
  Administered 2015-03-11: 30 mg via INTRAVENOUS

## 2015-03-11 SURGICAL SUPPLY — 41 items
BAG URO DRAIN 2000ML W/SPOUT (MISCELLANEOUS) ×3 IMPLANT
BULB RESERV EVAC DRAIN JP 100C (MISCELLANEOUS) ×3 IMPLANT
CANISTER SUCT 1200ML W/VALVE (MISCELLANEOUS) ×3 IMPLANT
CATH TRAY 16F METER LATEX (MISCELLANEOUS) ×3 IMPLANT
CHLORAPREP W/TINT 26ML (MISCELLANEOUS) ×3 IMPLANT
COVER CLAMP SIL LG PBX B (MISCELLANEOUS) IMPLANT
DRAIN CHANNEL JP 15F RND 16 (MISCELLANEOUS) IMPLANT
DRAPE LAPAROTOMY 100X77 ABD (DRAPES) ×3 IMPLANT
DRSG OPSITE POSTOP 4X10 (GAUZE/BANDAGES/DRESSINGS) ×3 IMPLANT
DRSG TELFA 3X8 NADH (GAUZE/BANDAGES/DRESSINGS) ×3 IMPLANT
ELECT BLADE 6 FLAT ULTRCLN (ELECTRODE) ×3 IMPLANT
GAUZE SPONGE 4X4 12PLY STRL (GAUZE/BANDAGES/DRESSINGS) ×3 IMPLANT
GLOVE BIO SURGEON STRL SZ7.5 (GLOVE) ×12 IMPLANT
GLOVE INDICATOR 8.0 STRL GRN (GLOVE) ×6 IMPLANT
GOWN STRL REUS W/ TWL LRG LVL3 (GOWN DISPOSABLE) ×4 IMPLANT
GOWN STRL REUS W/TWL LRG LVL3 (GOWN DISPOSABLE) ×8
KIT RM TURNOVER STRD PROC AR (KITS) ×3 IMPLANT
LABEL OR SOLS (LABEL) ×3 IMPLANT
NEEDLE HYPO 22GX1.5 SAFETY (NEEDLE) ×3 IMPLANT
NS IRRIG 1000ML POUR BTL (IV SOLUTION) ×6 IMPLANT
PACK BASIN MAJOR ARMC (MISCELLANEOUS) ×3 IMPLANT
PAD GROUND ADULT SPLIT (MISCELLANEOUS) ×3 IMPLANT
SET YANKAUER POOLE SUCT (MISCELLANEOUS) ×3 IMPLANT
SPONGE LAP 18X18 5 PK (GAUZE/BANDAGES/DRESSINGS) ×3 IMPLANT
STAPLE RELOAD 2.5MM WHITE (STAPLE) ×3 IMPLANT
STAPLER SKIN PROX 35W (STAPLE) ×3 IMPLANT
SUT ETH BLK MONO 3 0 FS 1 12/B (SUTURE) ×3 IMPLANT
SUT MAXON ABS #0 GS21 30IN (SUTURE) ×6 IMPLANT
SUT PROLENE 0 CT 1 30 (SUTURE) ×15 IMPLANT
SUT SILK 3-0 (SUTURE) IMPLANT
SUT VIC AB 2-0 BRD 54 (SUTURE) IMPLANT
SUT VIC AB 2-0 CT1 27 (SUTURE) ×2
SUT VIC AB 2-0 CT1 TAPERPNT 27 (SUTURE) ×1 IMPLANT
SUT VIC AB 2-0 SH 27 (SUTURE) ×8
SUT VIC AB 2-0 SH 27XBRD (SUTURE) ×4 IMPLANT
SUT VIC AB 3-0 SH 27 (SUTURE)
SUT VIC AB 3-0 SH 27X BRD (SUTURE) IMPLANT
SUT VICRYL 3-0 SH-1 18IN (SUTURE) ×6 IMPLANT
SYR 20CC LL (SYRINGE) ×3 IMPLANT
SYRINGE 10CC LL (SYRINGE) ×3 IMPLANT
TUBE MOSS GAS 18FR (TUBING) ×3 IMPLANT

## 2015-03-11 NOTE — Transfer of Care (Signed)
Immediate Anesthesia Transfer of Care Note  Patient: Diana [name]  Procedure(s) Performed: Procedure(s): REDUCTION OF GASTRIC VOLVULUS, SPLEENECTOMY, GASTRIC TUBE PLACEMENT (N/A)  Patient Location: PACU  Anesthesia Type:General  Level of Consciousness: awake, alert , oriented and patient cooperative  Airway & Oxygen Therapy: Patient Spontanous Breathing and Patient connected to face mask oxygen  Post-op Assessment: Report given to RN and Post -op Vital signs reviewed and stable  Post vital signs: Reviewed and stable  Last Vitals:  Filed Vitals:   03/11/15 0947  BP:   Pulse:   Temp: 36.4 C  Resp: 22    Complications: No apparent anesthesia complications

## 2015-03-11 NOTE — ED Provider Notes (Signed)
[facility] Emergency Department Provider Note  ____________________________________________  Time seen: 12:15 AM  I have reviewed the triage vital signs and the nursing notes.   HISTORY  Chief Complaint Chest Pain; Nausea; and Abdominal Pain      HPI Diana [name] is a 80 y.o. female presents with acute onset of 10 out of 10 sharp and radiating right upper quadrant after eating dinner tonight. Patient also admits to multiple episodes of vomiting since onset of pain. Patient denies any abdominal surgery no history of gallbladder disease. Patient denies any alleviating factors for the pain.     Past Medical History  Diagnosis Date  . Lupus (systemic lupus erythematosus)   . Pulmonary hypertension   . Pneumonia   . Unspecified menopausal and postmenopausal disorder   . Primary pulmonary hypertension   . Acute glomerulonephritis with other specified pathological lesion in kidney in disease classified elsewhere(580.81)   . Unspecified hypothyroidism   . Shingles (herpes zoster) polyneuropathy March 2013    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  . Hiatal hernia   . DVT (deep venous thrombosis)     Patient Active Problem List   Diagnosis Date Noted  . Flank pain 12/21/2014  . Constipation 12/21/2014  . Dyspnea 11/13/2014  . Herpes zoster 11/10/2014  . Chronic anticoagulation 11/10/2014  . Venous insufficiency of both lower extremities 06/13/2014  . Breast cancer of upper-outer quadrant of left female breast 05/26/2014  . Urinary incontinence 01/15/2014  . Bladder spasms 01/02/2014  . Other and unspecified hyperlipidemia 08/21/2013  . H/O recurrent pneumonia 05/26/2013  . Hypertensive pulmonary venous disease 04/23/2013  . Diastolic dysfunction with heart failure 04/23/2013  . Long term current use of anticoagulant therapy 03/27/2013  . DVT, recurrent, lower extremity, chronic 01/07/2013  . Hiatal hernia 01/06/2013  . Renal mass, left 01/06/2013   . Sciatica of right side 11/20/2012  . Post herpetic neuralgia 11/20/2012  . Disseminated zoster 10/31/2012  . Insomnia 11/08/2011  . LUPUS ERYTHEMATOSUS 06/13/2008  . NEUROPATHY-PERIPHERAL 10/06/2006  . Hypothyroidism 09/25/2006  . ANEMIA, B12 DEFICIENCY 09/25/2006  . GERD 09/25/2006  . Systemic lupus erythematosus 09/25/2006  . Diabetes mellitus without complication [id number]  . SHINGLES, HX OF 09/25/2006  . TOBACCO ABUSE, HX OF 09/25/2006    Past Surgical History  Procedure Laterality Date  . Kyphoplasty N/A 02/10/2015    Procedure: [id number];  Surgeon: Hessie Knows, MD;  Location: [facility] ORS;  Service: Orthopedics;  Laterality: N/A;    Current Outpatient Rx  Name  Route  Sig  Dispense  Refill  . ALPRAZolam (XANAX) 0.5 MG tablet      TAKE 1 TABLET BY MOUTH AT BEDTIME AS NEEDED FOR ANXIETY OR SLEEP   30 tablet   5     This request is for a new prescription for a contr ...   . ciprofloxacin (CIPRO) 500 MG tablet   Oral   Take 1 tablet (500 mg total) by mouth 2 (two) times daily. Patient not taking: Reported on 02/10/2015   10 tablet   0   . cyanocobalamin (,VITAMIN B-12,) 1000 MCG/ML injection   Intramuscular   Inject 1 mL (1,000 mcg total) into the muscle every 30 (thirty) days.   10 mL   1   . fluticasone (FLONASE) 50 MCG/ACT nasal spray      USE 2 SPRAYS IN EACH NOSTRIL DAILY   16 g   5   . furosemide (LASIX) 20 MG tablet   Oral   Take 20 mg by  mouth daily.         Marland Kitchen gentamicin ointment (GARAMYCIN) 0.1 %   Topical   Apply 1 application topically 3 (three) times daily. Patient not taking: Reported on 02/10/2015   15 g   0   . HYDROcodone-acetaminophen (NORCO/VICODIN) 5-325 MG per tablet   Oral   Take 1 tablet by mouth every 6 (six) hours as needed for moderate pain.   90 tablet   0   . hydroxychloroquine (PLAQUENIL) 200 MG tablet      TAKE 1 TABLET EVERY DAY   30 tablet   8   . lactulose (CHRONULAC) 10 GM/15ML solution   Oral   Take  15 mLs (10 g total) by mouth 2 (two) times daily. Patient not taking: Reported on 02/10/2015   30 mL   0   . letrozole (FEMARA) 2.5 MG tablet   Oral   Take 2.5 mg by mouth daily.      5   . metoprolol tartrate (LOPRESSOR) 25 MG tablet   Oral   Take 25 mg by mouth daily.          . mycophenolate (CELLCEPT) 500 MG tablet   Oral   Take 500 mg by mouth 2 (two) times daily. Take two 500 mg tab in the morning and two  500 mg at bed time         . omeprazole (PRILOSEC) 20 MG capsule   Oral   Take 40 mg by mouth daily.          . ondansetron (ZOFRAN) 4 MG tablet   Oral   Take 1 tablet (4 mg total) by mouth every 8 (eight) hours as needed for nausea or vomiting. Patient not taking: Reported on 02/10/2015   30 tablet   1   . PREDNISONE PO   Oral   Take 6 mg by mouth daily.          . ranitidine (ZANTAC) 150 MG tablet   Oral   Take 1 tablet (150 mg total) by mouth at bedtime.   90 tablet   2   . SYNTHROID 150 MCG tablet   Oral   Take 1 tablet (150 mcg total) by mouth daily.   30 tablet   11     Dispense as written.   . Tadalafil, PAH, (ADCIRCA) 20 MG TABS   Oral   Take 2 tablets by mouth daily.          . valACYclovir (VALTREX) 1000 MG tablet   Oral   Take 1 tablet (1,000 mg total) by mouth 3 (three) times daily. Patient not taking: Reported on 02/10/2015   21 tablet   0   . warfarin (COUMADIN) 5 MG tablet   Oral   Take 1 tablet (5 mg total) by mouth daily.   30 tablet   3   . warfarin (COUMADIN) 5 MG tablet   Oral   Take 1 tablet (5 mg total) by mouth daily. Take as directed   30 tablet   5     Allergies Amoxicillin; Cefuroxime; Cephalexin; Contrast media ; Sulfonamide derivatives; Tramadol hcl; and Venlafaxine  No family history on file.  Social History History  Substance Use Topics  . Smoking status: Former Smoker    Quit date: 05/25/1991  . Smokeless tobacco: Never Used  . Alcohol Use: Yes     Comment: rare    Review of  Systems  Constitutional: Negative for fever. Eyes: Negative for visual changes. ENT: Negative  for sore throat. Cardiovascular: Negative for chest pain. Respiratory: Negative for shortness of breath. Gastrointestinal: Positive for abdominal pain and vomiting Genitourinary: Negative for dysuria. Musculoskeletal: Negative for back pain. Skin: Negative for rash. Neurological: Negative for headaches, focal weakness or numbness.   10-point ROS otherwise negative.  ____________________________________________   PHYSICAL EXAM:  VITAL SIGNS: ED Triage Vitals  Enc Vitals Group     BP --      Pulse --      Resp --      Temp --      Temp src --      SpO2 --      Weight --      Height --      Head Cir --      Peak Flow --      Pain Score 03/10/15 2336 7     Pain Loc --      Pain Edu? --      Excl. in [place]? --      Constitutional: Alert and oriented. Well appearing and in no distress. Eyes: Conjunctivae are normal. PERRL. Normal extraocular movements. ENT   Head: Normocephalic and atraumatic.   Nose: No congestion/rhinnorhea.   Mouth/Throat: Mucous membranes are moist.   Neck: No stridor. Cardiovascular: Normal rate, regular rhythm. Normal and symmetric distal pulses are present in all extremities. No murmurs, rubs, or gallops. Respiratory: Normal respiratory effort without tachypnea nor retractions. Breath sounds are clear and equal bilaterally. No wheezes/rales/rhonchi. Gastrointestinal: Positive right upper quadrant pain with palpation. No distention. There is no CVA tenderness. Genitourinary: deferred Musculoskeletal: Nontender with normal range of motion in all extremities. No joint effusions.  No lower extremity tenderness nor edema. Neurologic:  Normal speech and language. No gross focal neurologic deficits are appreciated. Speech is normal.  Skin:  Skin is warm, dry and intact. No rash noted. Psychiatric: Mood and affect are normal. Speech and behavior are  normal. Patient exhibits appropriate insight and judgment.  ____________________________________________    LABS (pertinent positives/negatives)  Labs Reviewed  CBC - Abnormal; Notable for the following:    MCHC 31.9 (*)    RDW 16.6 (*)    All other components within normal limits  BASIC METABOLIC PANEL - Abnormal; Notable for the following:    Glucose, Bld 152 (*)    BUN 23 (*)    Creatinine, Ser 1.33 (*)    GFR calc non Af Amer 37 (*)    GFR calc Af Amer 43 (*)    All other components within normal limits  TROPONIN I  LIPASE, BLOOD     _    RADIOLOGY  I personally reviewed the patient's CAT scan of the abdomen and pelvis. CT abdomen and pelvis revealed: IMPRESSION: 1. Gastric volvulus with the majority of the stomach located within the chest. A portion of the distal stomach/ gastric antrum is positioned within the chest as well, and appears to be compressed as it courses through the right aspect of the hernia. Large amount of intraluminal fluid within the stomach proximally suggests that this is obstructive in nature. No evidence for obstruction distally within the abdomen and pelvis. 2. Small right pleural effusion. 3. Colonic diverticulosis without evidence for acute diverticulitis. 4. Punctate 2 -3 mm nonobstructive right renal calculus. 5. Scoliosis with multilevel degenerative disc disease and sequelae of prior vertebral augmentation at L1 and L2.  ____________________________________________      INITIAL IMPRESSION / ASSESSMENT AND PLAN / ED COURSE  Pertinent labs & imaging results that  were available during my care of the patient were reviewed by me and considered in my medical decision making (see chart for details).  She received multiple doses of antibiotic and analgesia namely Zofran Phenergan and morphine during emergency department stay History physical exam and CAT scan consistent with gastric volvulus. Dr. Bary Castilla was notified of the patient's  clinical findings as well as CT.  ____________________________________________   FINAL CLINICAL IMPRESSION(S) / ED DIAGNOSES  Final diagnoses:  Acute gastric volvulus      Gregor Hams, MD 03/12/15 2316

## 2015-03-11 NOTE — Anesthesia Preprocedure Evaluation (Addendum)
Anesthesia Evaluation  Patient identified by MRN, date of birth, ID band Patient awake    Reviewed: Allergy & Precautions, NPO status , Patient's Chart, lab work & pertinent test results  Airway Mallampati: I  TM Distance: >3 FB Neck ROM: Limited    Dental  (+) Teeth Intact   Pulmonary shortness of breath and with exertion, former smoker,  Lupus, on steroids and immunosupppressives, affects lungs kidneys +/- heart. breath sounds clear to auscultation        Cardiovascular Exercise Tolerance: Poor + Peripheral Vascular Disease and +CHF Rhythm:Regular Rate:Tachycardia  Remote Hx of CHF ? Lupus related?   Neuro/Psych  Neuromuscular disease    GI/Hepatic hiatal hernia, GERD-  Controlled,  Endo/Other  diabetes, Well Controlled, Type 2Hypothyroidism   Renal/GU Renal InsufficiencyRenal disease     Musculoskeletal   Abdominal   Peds  Hematology  (+) anemia ,   Anesthesia Other Findings   Reproductive/Obstetrics                            Anesthesia Physical Anesthesia Plan  ASA: IV and emergent  Anesthesia Plan: General   Post-op Pain Management:    Induction: Intravenous and Rapid sequence  Airway Management Planned: Oral ETT  Additional Equipment:   Intra-op Plan:   Post-operative Plan: Extubation in OR  Informed Consent: I have reviewed the patients History and Physical, chart, labs and discussed the procedure including the risks, benefits and alternatives for the proposed anesthesia with the patient or authorized representative who has indicated his/her understanding and acceptance.     Plan Discussed with: CRNA  Anesthesia Plan Comments:         Anesthesia Quick Evaluation

## 2015-03-11 NOTE — ED Notes (Signed)
Pt refusing to drink anymore contrast.  MD notified and aware.  CT notified to get pt.

## 2015-03-11 NOTE — ED Notes (Signed)
Pt placed on 2L O2.  Pt states wears at home at night.

## 2015-03-11 NOTE — Progress Notes (Signed)
Comfortable. HR normal. BP/ Urine output good. Minimal PCA requirement. PT INR post FFP: 1.8. Will hold anticoagulation for present in light of splenectomy. Repeat INR in AM. Feeding port of gastrostomy tube may be used for meds if needed.

## 2015-03-11 NOTE — Consult Note (Signed)
[facility] HOSPITALIST  Medical Consultation  KATILIN IM QMV:[id number] DOB: May 28, 1935 DOA: 03/10/2015 PCP: Sherlene Shams, MD   Requesting physician: Dr. Lahoma Rocker Date of consultation: 03/11/15 Reason for consultation:  Medical opinion regarding patient's lupus pulmonary, hypertension  CHIEF COMPLAINT:   Chief Complaint  Patient presents with  . Chest Pain  . Nausea  . Abdominal Pain    HISTORY OF PRESENT ILLNESS: Diana [name]  is a 80 y.o. female with a known history of lupus, pulmonary hypertension, on oxygen therapy at nighttime. Who is admitted by the surgical surgery for having nausea vomiting abdominal pain. Patient underwent evaluation and was noted to have volvulus. Patient underwent expiratory laparotomy, reduction of volvulus and incidental splenectomy and gastropexy.  Patient had estimated blood loss of 450 ML's. She is currently complaining of the abdominal pain but no shortness of breath or chest pain.  PAST MEDICAL HISTORY:   Past Medical History  Diagnosis Date  . Lupus (systemic lupus erythematosus)   . Pulmonary hypertension   . Pneumonia   . Unspecified menopausal and postmenopausal disorder   . Primary pulmonary hypertension   . Acute glomerulonephritis with other specified pathological lesion in kidney in disease classified elsewhere(580.81)   . Unspecified hypothyroidism   . Shingles (herpes zoster) polyneuropathy March 2013    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  . Hiatal hernia   . DVT (deep venous thrombosis)   . Cancer     Breast    PAST SURGICAL HISTORY:  Past Surgical History  Procedure Laterality Date  . Kyphoplasty N/A 02/10/2015    Procedure: [id number];  Surgeon: Kennedy Bucker, MD;  Location: [facility] ORS;  Service: Orthopedics;  Laterality: N/A;    SOCIAL HISTORY:  History  Substance Use Topics  . Smoking status: Former Smoker    Quit date: 05/25/1991  . Smokeless tobacco: Never Used  . Alcohol Use: Yes     Comment: rare     FAMILY HISTORY: History reviewed. No pertinent family history.  DRUG ALLERGIES:  Allergies  Allergen Reactions  . Amoxicillin     REACTION: Stomach cramps  . Cefuroxime Itching  . Cephalexin     REACTION: Rash  . Contrast Media  [Iodinated Diagnostic Agents] Hives    Pt states she broke out in hives years ago, has been premedicated in the past.   . Sulfonamide Derivatives     REACTION: Rash  . Tramadol Hcl     REACTION: Rash  . Venlafaxine     REACTION: Unknown reaction    REVIEW OF SYSTEMS:   CONSTITUTIONAL: No fever, fatigue or weakness.  EYES: No blurred or double vision.  EARS, NOSE, AND THROAT: No tinnitus or ear pain.  RESPIRATORY: No cough, shortness of breath, wheezing or hemoptysis.  CARDIOVASCULAR: No chest pain, orthopnea, edema.  GASTROINTESTINAL: Positive nausea, vomiting, diarrhea or positive abdominal pain.  GENITOURINARY: No dysuria, hematuria.  ENDOCRINE: No polyuria, nocturia,  HEMATOLOGY: No anemia, easy bruising or bleeding SKIN: No rash or lesion. MUSCULOSKELETAL: No joint pain or arthritis.   NEUROLOGIC: No tingling, numbness, weakness.  PSYCHIATRY: No anxiety or depression.   MEDICATIONS AT HOME:  Prior to Admission medications   Medication Sig Start Date End Date Taking? Authorizing Provider  amoxicillin (AMOXIL) 500 MG tablet Take 500 mg by mouth 2 (two) times daily.   Yes Historical Provider, MD  ALPRAZolam Prudy Feeler) 0.5 MG tablet TAKE 1 TABLET BY MOUTH AT BEDTIME AS NEEDED FOR ANXIETY OR SLEEP 01/22/15   Sherlene Shams, MD  ciprofloxacin (CIPRO) 500  MG tablet Take 1 tablet (500 mg total) by mouth 2 (two) times daily. Patient not taking: Reported on 02/10/2015 01/09/15   Carollee Leitz, NP  cyanocobalamin (,VITAMIN B-12,) 1000 MCG/ML injection Inject 1 mL (1,000 mcg total) into the muscle every 30 (thirty) days. 11/08/11   Sherlene Shams, MD  fluticasone (FLONASE) 50 MCG/ACT nasal spray USE 2 SPRAYS IN [facility] NOSTRIL DAILY 08/18/14   Sherlene Shams,  MD  furosemide (LASIX) 20 MG tablet Take 20 mg by mouth daily.    Historical Provider, MD  gentamicin ointment (GARAMYCIN) 0.1 % Apply 1 application topically 3 (three) times daily. Patient not taking: Reported on 02/10/2015 11/10/14   Shelia Media, MD  HYDROcodone-acetaminophen (NORCO/VICODIN) 5-325 MG per tablet Take 1 tablet by mouth every 6 (six) hours as needed for moderate pain. 06/10/14   Sherlene Shams, MD  hydroxychloroquine (PLAQUENIL) 200 MG tablet TAKE 1 TABLET EVERY DAY 04/12/12   Sherlene Shams, MD  lactulose (CHRONULAC) 10 GM/15ML solution Take 15 mLs (10 g total) by mouth 2 (two) times daily. Patient not taking: Reported on 02/10/2015 12/17/14   Carollee Leitz, NP  letrozole [facility]) 2.5 MG tablet Take 2.5 mg by mouth daily. 10/26/14   Historical Provider, MD  metoprolol tartrate (LOPRESSOR) 25 MG tablet Take 25 mg by mouth daily.     Historical Provider, MD  mycophenolate (CELLCEPT) 500 MG tablet Take 500 mg by mouth 2 (two) times daily. Take two 500 mg tab in the morning and two  500 mg at bed time    Historical Provider, MD  omeprazole (PRILOSEC) 20 MG capsule Take 40 mg by mouth daily.     Historical Provider, MD  ondansetron (ZOFRAN) 4 MG tablet Take 1 tablet (4 mg total) by mouth every 8 (eight) hours as needed for nausea or vomiting. Patient not taking: Reported on 02/10/2015 06/10/14   Sherlene Shams, MD  PREDNISONE PO Take 6 mg by mouth daily.     Historical Provider, MD  ranitidine (ZANTAC) 150 MG tablet Take 1 tablet (150 mg total) by mouth at bedtime. 11/19/14   Sherlene Shams, MD  SYNTHROID 150 MCG tablet Take 1 tablet (150 mcg total) by mouth daily. 01/12/15   Sherlene Shams, MD  Tadalafil, PAH, (ADCIRCA) 20 MG TABS Take 2 tablets by mouth daily.     Historical Provider, MD  valACYclovir (VALTREX) 1000 MG tablet Take 1 tablet (1,000 mg total) by mouth 3 (three) times daily. Patient not taking: Reported on 02/10/2015 11/19/14   Sherlene Shams, MD  warfarin (COUMADIN) 5 MG tablet  Take 1 tablet (5 mg total) by mouth daily. 09/08/14   Sherlene Shams, MD  warfarin (COUMADIN) 5 MG tablet Take 1 tablet (5 mg total) by mouth daily. Take as directed 09/08/14   Sherlene Shams, MD      PHYSICAL EXAMINATION:   VITAL SIGNS: Blood pressure 154/79, pulse 73, temperature 97.5 F (36.4 C), temperature source Tympanic, resp. rate 23, SpO2 95 %.  GENERAL:  80 y.o.-year-old patient lying in the bed with no acute distress.  EYES: Pupils equal, round, reactive to light and accommodation. No scleral icterus. Extraocular muscles intact.  HEENT: Head atraumatic, normocephalic. Oropharynx and nasopharynx clear.  NECK:  Supple, no jugular venous distention. No thyroid enlargement, no tenderness.  LUNGS: Normal breath sounds bilaterally, no wheezing, rales,rhonchi or crepitation. No use of accessory muscles of respiration.  CARDIOVASCULAR: S1, S2 normal. No murmurs, rubs, or gallops.  ABDOMEN:  Postop  EXTREMITIES: No pedal edema, cyanosis, or clubbing.  NEUROLOGIC: Cranial nerves II through XII are intact. Muscle strength 5/5 in all extremities. Sensation intact. Gait not checked.  PSYCHIATRIC: The patient is alert and oriented x 3.  SKIN: No obvious rash, lesion, or ulcer.   LABORATORY PANEL:   CBC  Recent Labs Lab 03/10/15 2354  WBC 4.8  HGB 12.1  HCT 37.8  PLT 251  MCV 93.7  MCH 29.9  MCHC 31.9*  RDW 16.6*   ------------------------------------------------------------------------------------------------------------------  Chemistries   Recent Labs Lab 03/10/15 2354  NA 144  K 3.9  CL 106  CO2 24  GLUCOSE 152*  BUN 23*  CREATININE 1.33*  CALCIUM 9.7   ------------------------------------------------------------------------------------------------------------------ CrCl cannot be calculated (Unknown ideal weight.). ------------------------------------------------------------------------------------------------------------------ No results for input(s): TSH,  T4TOTAL, T3FREE, THYROIDAB in the last 72 hours.  Invalid input(s): FREET3   Coagulation profile  Recent Labs Lab 03/11/15 0642  INR 2.08   ------------------------------------------------------------------------------------------------------------------- No results for input(s): DDIMER in the last 72 hours. -------------------------------------------------------------------------------------------------------------------  Cardiac Enzymes  Recent Labs Lab 03/10/15 2354  TROPONINI <0.03   ------------------------------------------------------------------------------------------------------------------ Invalid input(s): POCBNP  ---------------------------------------------------------------------------------------------------------------  Urinalysis    Component Value Date/Time   COLORURINE YELLOW 12/24/2012 1454   APPEARANCEUR CLEAR 12/24/2012 1454   LABSPEC >=1.030 12/24/2012 1454   PHURINE 5.5 12/24/2012 1454   GLUCOSEU NEGATIVE 12/24/2012 1454   HGBUR SMALL 12/24/2012 1454   HGBUR trace-intact 03/10/2009 1351   BILIRUBINUR neg 12/17/2014 1419   BILIRUBINUR NEGATIVE 12/24/2012 1454   KETONESUR TRACE 12/24/2012 1454   PROTEINUR neg 12/17/2014 1419   UROBILINOGEN 0.2 12/17/2014 1419   UROBILINOGEN 0.2 12/24/2012 1454   NITRITE neg 12/17/2014 1419   NITRITE NEGATIVE 12/24/2012 1454   LEUKOCYTESUR small (1+) 12/17/2014 1419     RADIOLOGY: Ct Abdomen Pelvis Wo Contrast  03/11/2015   CLINICAL DATA:  Initial evaluation for vomiting, abdominal pain.  EXAM: CT ABDOMEN AND PELVIS WITHOUT CONTRAST  TECHNIQUE: Multidetector CT imaging of the abdomen and pelvis was performed following the standard protocol without IV contrast.  COMPARISON:  None.  FINDINGS: Atelectatic changes noted within the partially visualized lung bases. Trace pericardial fluid noted. There is a small layering right pleural effusion.  Liver demonstrates a normal unenhanced appearance. Gallbladder within  normal limits. No biliary dilatation. Spleen and adrenal glands are within normal limits. Fatty atrophy of the pancreas noted.  4 cm cyst present within the interpolar left kidney. Subcentimeter hypodense lesion within the inferior pole the right kidney is too small the characterize, but statistically likely reflects a small cyst as well. There is a punctate 2-3 mm nonobstructive right renal calculus. No hydronephrosis. No hydroureter.  The majority of the stomach is flipped superiorly into the chest. The gastric antrum is seen coursing to the right along the herniated portion of the stomach, and appears partially compressed. Large amount of fluid present within the stomach proximally. Finding suggestive of gastric volvulus with associated obstruction. Complete characterization of the volvulus somewhat difficult as the entirety of the hernia is not visualized on this examination. Large amount of fluid within the gastric lumen. Portion of the duodenum present within the chest is well. No evidence for bowel obstruction. Prominent colonic diverticulosis without evidence for acute diverticulitis. Appendix is normal. No acute inflammatory changes about the bowels.  Bladder within normal limits. Uterus and ovaries normal for patient age.  No free air or fluid no pathologically enlarged intra-abdominal or pelvic lymph nodes. Tortuosity the intrathoracic aorta present. Fairly mild atheromatous plaque for  patient age within the infrarenal aorta. No aneurysm.  Scoliosis with associated multilevel degenerative changes present. Sequelae of prior vertebral augmentation present at L1 and L2. There is a chronic compression deformity at L1.  IMPRESSION: 1. Gastric volvulus with the majority of the stomach located within the chest. A portion of the distal stomach/ gastric antrum is positioned within the chest as well, and appears to be compressed as it courses through the right aspect of the hernia. Large amount of intraluminal  fluid within the stomach proximally suggests that this is obstructive in nature. No evidence for obstruction distally within the abdomen and pelvis. 2. Small right pleural effusion. 3. Colonic diverticulosis without evidence for acute diverticulitis. 4. Punctate 2 -3 mm nonobstructive right renal calculus. 5. Scoliosis with multilevel degenerative disc disease and sequelae of prior vertebral augmentation at L1 and L2.   Electronically Signed   By: Rise Mu M.D.   On: 03/11/2015 04:08   Dg Chest 2 View  03/11/2015   CLINICAL DATA:  Generalized chest pain, acute onset. Shortness of breath, nausea and vomiting. Diffuse abdominal pain. Initial encounter.  EXAM: CHEST  2 VIEW  COMPARISON:  Chest radiograph performed 11/13/2014  FINDINGS: The lungs are well-aerated. Mild vascular congestion is noted. Minimal scarring is noted at the left lung apex. There is no evidence of pleural effusion or pneumothorax.  The heart is borderline enlarged, though partially obscured by the patient's very large hiatal hernia. No acute osseous abnormalities are seen. The patient is status post vertebroplasty at the upper lumbar spine.  IMPRESSION: 1. Mild vascular congestion and borderline cardiomegaly. Lungs remain grossly clear. 2. Very large hiatal hernia seen.   Electronically Signed   By: Roanna Raider M.D.   On: 03/11/2015 00:43   Dg Chest Port 1 View  03/11/2015   CLINICAL DATA:  Nasogastric tube placement. Chest pain, nausea and vomiting. Initial encounter.  EXAM: PORTABLE CHEST - 1 VIEW  COMPARISON:  CT of the abdomen and pelvis performed earlier today at 3:17 a.m.  FINDINGS: The patient's enteric tube is seen extending overlying the stomach below the level of the diaphragm. The markedly dilated paraesophageal hernia is again seen, reflecting obstruction due to compression of the distal stomach and pylorus on the right side of the hernia, as seen on CT.  Bibasilar atelectasis is noted. No pleural effusion or  pneumothorax is seen.  The cardiomediastinal silhouette is borderline normal in size. No acute osseous abnormalities are identified.  IMPRESSION: Enteric tube noted extending overlying the stomach below the level of the diaphragm. Markedly dilated paraesophageal hernia again seen, reflecting obstruction due to compression of the distal stomach and pylorus on the right side of the hernia, as noted on CT.   Electronically Signed   By: Roanna Raider M.D.   On: 03/11/2015 05:55   US Abdomen Limited Ruq  03/11/2015   CLINICAL DATA:  Acute onset of right upper quadrant abdominal pain. Initial encounter.  EXAM: US ABDOMEN LIMITED - RIGHT UPPER QUADRANT  COMPARISON:  None.  FINDINGS: Gallbladder:  No gallstones or wall thickening visualized. Mildly distended but otherwise grossly unremarkable. No sonographic Murphy sign noted.  Common bile duct:  Diameter: 0.4 cm, within normal limits in caliber.  Liver:  No focal lesion identified. Within normal limits in parenchymal echogenicity.  IMPRESSION: Gallbladder mildly distended but otherwise grossly unremarkable. This may simply reflect the patient's NPO status. Unremarkable ultrasound of the right upper quadrant.   Electronically Signed   By: Beryle Beams.D.  On: 03/11/2015 03:33    EKG: Orders placed or performed during the hospital encounter of 03/10/15  . ED EKG (<50mins upon arrival to the ED)  . ED EKG (<65mins upon arrival to the ED)  . EKG 12-Lead  . EKG 12-Lead    IMPRESSION AND PLAN: Patient is a 80 year old white female with multiple medical problems status post expiratory lap for repair of a volvulus  1. Pulmonary hypertension: Closely monitor respiratory status pulmonary toileting. Oxygen therapy 2. History of SLE: On 6 mg of prednisone daily, will place her on low-dose IV Solu-Medrol every 12 hours until able to take oral 3. History of DVT: On chronic anticoagulation with Coumadin, resume Coumadin once okay per surgery, due to 450 cc blood  loss currently anticoagulation is held, recommends at least starting patient on low-dose Lovenox but will defer this to surgery when they feel she is stable. 4. Hypothyroidism: Replace her Synthroid Iv 5.  Anxiety disorder: Add low-dose when necessary IV Ativan 6. GERD: IV famotidine          CODE STATUS:    TOTAL TIME TAKING CARE OF THIS PATIENT: 55 minutes.    Auburn Bilberry M.D on 03/11/2015 at 10:39 AM  Between 7am to 6pm - Pager - [phone]  After 6pm go to [email or web address] - password [email or web address] [facility]  [facility]  Office  [phone]  CC: Primary care physician; Sherlene Shams, MD

## 2015-03-11 NOTE — Op Note (Signed)
Preoperative diagnosis: Gastric volvulus.  Postoperative diagnosis: Same.  Operative procedure: Exploratory laparotomy, reduction of volvulus, incidental splenectomy, gastropexy.  Operating surgeon: Hervey Ard, M.D.  Asst.: Ellwood Sayers, M.D.  Anesthesia: Gen. endotracheal.  Estimated blood loss: 4 50 mL.  Fluids: 340 mL FFP, 1300 mL crystalloid.  Urine output: 50 mL.  Clinical note this 80 year old woman presented with a short history of nausea followed by vomiting followed by abdominal pain. CT suggested volvulus. Due to her progressive symptoms she is brought For exploration. She received Kefzol prior to the procedure. SCD devices were placed in the operating room. She did receive 1 unit of FFP during the procedure as her preoperative INR was 2.0 from chronic anticoagulation.  Operative note with the patient under adequate general endotracheal anesthesia the abdomen was prepped with chloraprep and draped. An upper midline incision was made and carried down through skin and subcutis tissue with hemostasis achieved by electrocautery. No free fluid or odor was appreciated on opening the fascia and peritoneum. The stomach was noted to be markedly dilated in spite of a previously placed NG tube. The stomach was gently reduced into the abdominal cavity. An adhesive band from the omentum were divided between oh Vicryls ties. This appeared to be the site of volvulus. During mobilization of the stomach a serosal capsular tear of the spleen occurred. Due to her anticoagulation status it was elected to proceed to splenectomy. The vascular pedicle was divided with a single application of a vascular stapler and excellent hemostasis noted. The spleen was sent in formalin for routine histology.  Attention was turned to the stomach which was markedly dilated. A gastrostomy was made in the distal antrum and approximately a liter of fluid and air aspirated. The normal orientation the stomach was corrected.  The mild defect was relatively small. Inspection of the left upper quadrant showed no evidence of bleeding. An 59 French Moss gastrostomy tube was passed to the anterior abdominal wall through the previously made gastrotomy. This was then advanced through the pylorus into the third portion of the duodenum. The double pursestring of 2-0 Vicryls were snugged and the gastrostomy sewn to the anterior abdominal wall with 4 point fixation. An additional 5 areas of the stomach were sewn to the anterior abdominal wall and interrupted 20 Vicryls sutures. This maintained the normal curvature of the stomach and was done to permit suture of volvulus. The peritoneum was closed with running 2-0 Vicryls. The fascia was closed with interrupted 0 proline figure-of-eight sutures. The wound was irrigated with saline. Adipose tissue was approximated with a running 3-0 Vicryls. The skin was closed with staples. 30 mL of 0.5% Marcaine plain was infiltrated into the wound for postoperative analgesia. A dry honeycomb dressing was placed and the gastrostomy tube placed to close drainage.  The patient tolerated procedure well and was extubated in the recovery room.

## 2015-03-11 NOTE — Anesthesia Postprocedure Evaluation (Signed)
  Anesthesia Post-op Note  Patient: Diana [name]  Procedure(s) Performed: Procedure(s): REDUCTION OF GASTRIC VOLVULUS, SPLEENECTOMY, GASTRIC TUBE PLACEMENT (N/A)  Anesthesia type:General  Patient location: PACU  Post pain: Pain level controlled  Post assessment: Post-op Vital signs reviewed, Patient's Cardiovascular Status Stable, Respiratory Function Stable, Patent Airway and No signs of Nausea or vomiting  Post vital signs: Reviewed and stable  Last Vitals:  Filed Vitals:   03/11/15 0955  BP:   Pulse: 92  Temp:   Resp: 15    Level of consciousness: awake, alert  and patient cooperative  Complications: No apparent anesthesia complications

## 2015-03-11 NOTE — H&P (Signed)
Diana [name] is an 80 y.o. female.   Chief Complaint: Nausea, vomiting abdominal pain.   HPI: This 87 with multiple medical comorbidities was in her usual state of health until 1 hour after her evening meal when she reported heartburn symptoms followed by nausea. She describes multiple episodes of vomiting initially of liquid material without blood. This failed to relieve her pain. She had a spontaneous, normal bowel movement without relief of discomfort, as well as additional episodes of vomiting. She presented to the emergency department for assessment.  A long significant past medical history is notable including the last 12 months treatment for left breast cancer as well as kyphoplasty at the L2 1 and then L2 level.  There is past history of pulmonary embolus for which she is on chronic anticoagulation.  The patient had a known hiatal hernia that was minimally symptomatic.  While she lives with her son, she is independent in all activities of daily living.  The patient reports the only true medical allergy he has sulfa and tramadol. These produced a profound rash requiring dermatologic evaluation. The other at a by Cristie Hem on her allergy list or only notable for GI distress.  Past Medical History  Diagnosis Date  . Lupus (systemic lupus erythematosus)   . Pulmonary hypertension   . Pneumonia   . Unspecified menopausal and postmenopausal disorder   . Primary pulmonary hypertension   . Acute glomerulonephritis with other specified pathological lesion in kidney in disease classified elsewhere(580.81)   . Unspecified hypothyroidism   . Shingles (herpes zoster) polyneuropathy March 2013    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  . Hiatal hernia   . DVT (deep venous thrombosis)     Past Surgical History  Procedure Laterality Date  . Kyphoplasty N/A 02/10/2015    Procedure: LJQGBEEFEOF/H2;  Surgeon: Hessie Knows, MD;  Location: [facility] ORS;  Service: Orthopedics;  Laterality: N/A;    No  family history on file. Social History:  reports that she quit smoking about 23 years ago. She has never used smokeless tobacco. She reports that she drinks alcohol. She reports that she does not use illicit drugs.  Allergies:  Allergies  Allergen Reactions  . Amoxicillin     REACTION: Stomach cramps  . Cefuroxime Itching  . Cephalexin     REACTION: Rash  . Contrast Media  [Iodinated Diagnostic Agents] Hives    Pt states she broke out in hives years ago, has been premedicated in the past.   . Sulfonamide Derivatives     REACTION: Rash  . Tramadol Hcl     REACTION: Rash  . Venlafaxine     REACTION: Unknown reaction     (Not in a hospital admission)  Results for orders placed or performed during the hospital encounter of 03/10/15 (from the past 48 hour(s))  CBC     Status: Abnormal   Collection Time: 03/10/15 11:54 PM  Result Value Ref Range   WBC 4.8 3.6 - 11.0 K/uL   RBC 4.04 3.80 - 5.20 MIL/uL   Hemoglobin 12.1 12.0 - 16.0 g/dL   HCT 37.8 35.0 - 47.0 %   MCV 93.7 80.0 - 100.0 fL   MCH 29.9 26.0 - 34.0 pg   MCHC 31.9 (L) 32.0 - 36.0 g/dL   RDW 16.6 (H) 11.5 - 14.5 %   Platelets 251 150 - 440 K/uL  Basic metabolic panel     Status: Abnormal   Collection Time: 03/10/15 11:54 PM  Result Value Ref Range   Sodium 144  135 - 145 mmol/L   Potassium 3.9 3.5 - 5.1 mmol/L   Chloride 106 101 - 111 mmol/L   CO2 24 22 - 32 mmol/L   Glucose, Bld 152 (H) 65 - 99 mg/dL   BUN 23 (H) 6 - 20 mg/dL   Creatinine, Ser 1.33 (H) 0.44 - 1.00 mg/dL   Calcium 9.7 8.9 - 10.3 mg/dL   GFR calc non Af Amer 37 (L) >60 mL/min   GFR calc Af Amer 43 (L) >60 mL/min    Comment: (NOTE) The eGFR has been calculated using the CKD EPI equation. This calculation has not been validated in all clinical situations. eGFR's persistently <60 mL/min signify possible Chronic Kidney Disease.    Anion gap 14 5 - 15  Troponin I     Status: None   Collection Time: 03/10/15 11:54 PM  Result Value Ref Range    Troponin I <0.03 <0.031 ng/mL    Comment:        NO INDICATION OF MYOCARDIAL INJURY.   Lipase, blood     Status: None   Collection Time: 03/10/15 11:54 PM  Result Value Ref Range   Lipase 25 22 - 51 U/L   Ct Abdomen Pelvis Wo Contrast  03/11/2015   CLINICAL DATA:  Initial evaluation for vomiting, abdominal pain.  EXAM: CT ABDOMEN AND PELVIS WITHOUT CONTRAST  TECHNIQUE: Multidetector CT imaging of the abdomen and pelvis was performed following the standard protocol without IV contrast.  COMPARISON:  None.  FINDINGS: Atelectatic changes noted within the partially visualized lung bases. Trace pericardial fluid noted. There is a small layering right pleural effusion.  Liver demonstrates a normal unenhanced appearance. Gallbladder within normal limits. No biliary dilatation. Spleen and adrenal glands are within normal limits. Fatty atrophy of the pancreas noted.  4 cm cyst present within the interpolar left kidney. Subcentimeter hypodense lesion within the inferior pole the right kidney is too small the characterize, but statistically likely reflects a small cyst as well. There is a punctate 2-3 mm nonobstructive right renal calculus. No hydronephrosis. No hydroureter.  The majority of the stomach is flipped superiorly into the chest. The gastric antrum is seen coursing to the right along the herniated portion of the stomach, and appears partially compressed. Large amount of fluid present within the stomach proximally. Finding suggestive of gastric volvulus with associated obstruction. Complete characterization of the volvulus somewhat difficult as the entirety of the hernia is not visualized on this examination. Large amount of fluid within the gastric lumen. Portion of the duodenum present within the chest is well. No evidence for bowel obstruction. Prominent colonic diverticulosis without evidence for acute diverticulitis. Appendix is normal. No acute inflammatory changes about the bowels.  Bladder within  normal limits. Uterus and ovaries normal for patient age.  No free air or fluid no pathologically enlarged intra-abdominal or pelvic lymph nodes. Tortuosity the intrathoracic aorta present. Fairly mild atheromatous plaque for patient age within the infrarenal aorta. No aneurysm.  Scoliosis with associated multilevel degenerative changes present. Sequelae of prior vertebral augmentation present at L1 and L2. There is a chronic compression deformity at L1.  IMPRESSION: 1. Gastric volvulus with the majority of the stomach located within the chest. A portion of the distal stomach/ gastric antrum is positioned within the chest as well, and appears to be compressed as it courses through the right aspect of the hernia. Large amount of intraluminal fluid within the stomach proximally suggests that this is obstructive in nature. No evidence for obstruction distally  within the abdomen and pelvis. 2. Small right pleural effusion. 3. Colonic diverticulosis without evidence for acute diverticulitis. 4. Punctate 2 -3 mm nonobstructive right renal calculus. 5. Scoliosis with multilevel degenerative disc disease and sequelae of prior vertebral augmentation at L1 and L2.   Electronically Signed   By: Jeannine Boga M.D.   On: 03/11/2015 04:08   Dg Chest 2 View  03/11/2015   CLINICAL DATA:  Generalized chest pain, acute onset. Shortness of breath, nausea and vomiting. Diffuse abdominal pain. Initial encounter.  EXAM: CHEST  2 VIEW  COMPARISON:  Chest radiograph performed 11/13/2014  FINDINGS: The lungs are well-aerated. Mild vascular congestion is noted. Minimal scarring is noted at the left lung apex. There is no evidence of pleural effusion or pneumothorax.  The heart is borderline enlarged, though partially obscured by the patient's very large hiatal hernia. No acute osseous abnormalities are seen. The patient is status post vertebroplasty at the upper lumbar spine.  IMPRESSION: 1. Mild vascular congestion and borderline  cardiomegaly. Lungs remain grossly clear. 2. Very large hiatal hernia seen.   Electronically Signed   By: Garald Balding M.D.   On: 03/11/2015 00:43   US Abdomen Limited Ruq  03/11/2015   CLINICAL DATA:  Acute onset of right upper quadrant abdominal pain. Initial encounter.  EXAM: US ABDOMEN LIMITED - RIGHT UPPER QUADRANT  COMPARISON:  None.  FINDINGS: Gallbladder:  No gallstones or wall thickening visualized. Mildly distended but otherwise grossly unremarkable. No sonographic Murphy sign noted.  Common bile duct:  Diameter: 0.4 cm, within normal limits in caliber.  Liver:  No focal lesion identified. Within normal limits in parenchymal echogenicity.  IMPRESSION: Gallbladder mildly distended but otherwise grossly unremarkable. This may simply reflect the patient's NPO status. Unremarkable ultrasound of the right upper quadrant.   Electronically Signed   By: Garald Balding M.D.   On: 03/11/2015 03:33    Review of Systems  Constitutional: Negative.   HENT: Negative.   Respiratory: Negative.   Cardiovascular: Negative.   Gastrointestinal: Positive for nausea, vomiting and abdominal pain. Negative for diarrhea and constipation.  Genitourinary: Negative.   Musculoskeletal: Negative.   Skin: Negative.   Neurological: Negative.   Endo/Heme/Allergies: Negative.   Psychiatric/Behavioral: Negative.     Blood pressure 166/100, pulse 91, resp. rate 20, SpO2 94 %. Physical Exam  Constitutional: She is oriented to person, place, and time. She appears well-developed and well-nourished.  HENT:  Head: Normocephalic and atraumatic.  Eyes: Conjunctivae are normal.  Neck: Normal range of motion. Neck supple.  Cardiovascular: Normal rate and regular rhythm.   Respiratory: Effort normal and breath sounds normal.  GI: There is tenderness (epigastrium).  Neurological: She is alert and oriented to person, place, and time. She has normal reflexes.  Skin: Skin is warm and dry.  Psychiatric: She has a normal  mood and affect. Her behavior is normal. Judgment and thought content normal.     Assessment/Plan  Radiologic images dating back to 2011 were reviewed. The sizable hiatal hernia first becomes evident in 2014. Plain films today showed a modest increase in size but no significant change in contour.  CT today suggest volvulus. The patient's progression from heartburn to nausea to pain without relief with passage of an NG tube suggest that operative intervention will be required.  Her last INR ProTime was normal. Repeat testing is pending.  The risks of surgery are significant considering her multiple comorbidities, we'll with her escalation of pain and no improvement in the plain film  of the chest post NG tube placement manual reduction and transfixion will be appropriate.  The risks of surgery have been reviewed with the patient and her son.   Robert Bellow 03/11/2015, 5:46 AM

## 2015-03-11 NOTE — ED Notes (Signed)
Called pharmacy to send pt medication to OR.

## 2015-03-12 DIAGNOSIS — I272 Pulmonary hypertension, unspecified: Secondary | ICD-10-CM | POA: Diagnosis present

## 2015-03-12 LAB — PREPARE FRESH FROZEN PLASMA: UNIT DIVISION: 0

## 2015-03-12 LAB — BASIC METABOLIC PANEL
ANION GAP: 7 (ref 5–15)
BUN: 18 mg/dL (ref 6–20)
CALCIUM: 8.5 mg/dL — AB (ref 8.9–10.3)
CO2: 28 mmol/L (ref 22–32)
CREATININE: 0.88 mg/dL (ref 0.44–1.00)
Chloride: 108 mmol/L (ref 101–111)
GFR calc non Af Amer: >60 mL/min (ref >60)
Glucose, Bld: 112 mg/dL — ABNORMAL HIGH (ref 65–99)
Potassium: 4.3 mmol/L (ref 3.5–5.1)
SODIUM: 143 mmol/L (ref 135–145)

## 2015-03-12 LAB — PROTIME-INR
INR: 2.26
PROTHROMBIN TIME: 25.1 s — AB (ref 11.4–15.0)

## 2015-03-12 LAB — CBC
HEMATOCRIT: 30.9 % — AB (ref 35.0–47.0)
HEMOGLOBIN: 9.7 g/dL — AB (ref 12.0–16.0)
MCH: 29.9 pg (ref 26.0–34.0)
MCHC: 31.5 g/dL — AB (ref 32.0–36.0)
MCV: 95 fL (ref 80.0–100.0)
Platelets: 209 10*3/uL (ref 150–440)
RBC: 3.25 MIL/uL — ABNORMAL LOW (ref 3.80–5.20)
RDW: 16.8 % — AB (ref 11.5–14.5)
WBC: 8.5 10*3/uL (ref 3.6–11.0)

## 2015-03-12 LAB — GLUCOSE, CAPILLARY: Glucose-Capillary: 111 mg/dL — ABNORMAL HIGH (ref 65–99)

## 2015-03-12 LAB — SURGICAL PATHOLOGY

## 2015-03-12 MED ORDER — JEVITY 1.2 CAL PO LIQD
1000.0000 mL | ORAL | Status: DC
  Administered 2015-03-15: 1000 mL
  Filled 2015-03-12: qty 1000

## 2015-03-12 MED ORDER — MYCOPHENOLATE 200 MG/ML ORAL SUSPENSION
1000.0000 mg | Freq: Two times a day (BID) | ORAL | Status: DC
  Filled 2015-03-12 (×2): qty 20

## 2015-03-12 MED ORDER — FREE WATER
25.0000 mL | Status: DC
  Administered 2015-03-12 – 2015-03-17 (×31): 25 mL

## 2015-03-12 MED ORDER — TADALAFIL (PAH) 20 MG PO TABS
40.0000 mg | ORAL_TABLET | Freq: Every day | ORAL | Status: DC
  Administered 2015-03-13 – 2015-03-20 (×8): 40 mg via ORAL
  Filled 2015-03-12 (×11): qty 2

## 2015-03-12 MED ORDER — LETROZOLE 2.5 MG PO TABS
2.5000 mg | ORAL_TABLET | Freq: Every day | ORAL | Status: DC
  Administered 2015-03-13 – 2015-03-20 (×8): 2.5 mg via ORAL
  Filled 2015-03-12 (×9): qty 1

## 2015-03-12 MED ORDER — JEVITY 1.2 CAL PO LIQD
1000.0000 mL | ORAL | Status: AC
  Administered 2015-03-12: 1000 mL
  Filled 2015-03-12: qty 1000

## 2015-03-12 MED ORDER — ACETAMINOPHEN 160 MG/5ML PO SOLN
650.0000 mg | ORAL | Status: DC
  Administered 2015-03-12 – 2015-03-13 (×4): 650 mg
  Filled 2015-03-12 (×11): qty 20.3

## 2015-03-12 MED ORDER — HYDROXYCHLOROQUINE SULFATE 200 MG PO TABS
200.0000 mg | ORAL_TABLET | Freq: Every day | ORAL | Status: DC
  Administered 2015-03-13 – 2015-03-20 (×8): 200 mg via ORAL
  Filled 2015-03-12 (×8): qty 1

## 2015-03-12 MED ORDER — BUDESONIDE-FORMOTEROL FUMARATE 160-4.5 MCG/ACT IN AERO
2.0000 | INHALATION_SPRAY | Freq: Two times a day (BID) | RESPIRATORY_TRACT | Status: DC
  Administered 2015-03-12 – 2015-03-20 (×16): 2 via RESPIRATORY_TRACT
  Filled 2015-03-12: qty 6

## 2015-03-12 MED ORDER — FLUTICASONE PROPIONATE 50 MCG/ACT NA SUSP
2.0000 | Freq: Every day | NASAL | Status: DC | PRN
  Filled 2015-03-12: qty 16

## 2015-03-12 NOTE — Progress Notes (Signed)
Afebrile, vital signs stable. Fairly comfortable. Some increase in pain with the Marcaine wearing off. Good control with PCA.  Chest exam is clear. Cardiac exam shows a regular rhythm.  Abdomen shows no distention. Bowel sounds present. Moderate diffuse tenderness.  Incision: Clean.  ProTime INR is up over 2.2. Initial anticoagulation is not required.  Hemoglobin just under 10. Expected with OR blood loss and IV fluids.  Urine output is excellent.  Plan: Transfer to floor. Initiate tube feedings pending resumption of gastric function. Sips by mouth. Ambulation.

## 2015-03-12 NOTE — Progress Notes (Signed)
Spoke to Dr. Bary Castilla. Okay to use feeding port for med administrations.

## 2015-03-12 NOTE — Progress Notes (Signed)
Initial Nutrition Assessment    INTERVENTION:  Tube feeding: discussed nutritional poc with MD Byrnett; recommend starting Jevity 1.2 at rate of 30 ml/hr (50% goal rate), goal rate is 60 ml/hr providing 1728 kcals, 81 g of protein, 1166 mL of free water. Feeding to infuse via "feeding" port of Moss G tube. Recommend starting free water flushes at 25 mL q 4 hours as pt remains currently on IVF. Do not recommend putting anything other than feedings via "feeding" port as per recommendations from manufacturer of tube.  Per Dietitian Consult and phone conversation, MD request to add food coloring to feeds. MD Bary Castilla is aware that addition of food coloring to TF is not recommended but would like to place in formula. Discussed with nursing.   NUTRITION DIAGNOSIS:  Inadequate oral intake related to altered GI function, acute illness as evidenced by NPO status.  GOAL:  Patient will meet greater than or equal to 90% of their needs   MONITOR:   (EN, Digestive System, Anthropometrics, Electrolyte/Renal Profile, Glucose Profile, Energy Intake)  REASON FOR ASSESSMENT:  Consult Enteral/tube feeding initiation and management;      Start enteral feeds via feeding port of G tube at 50% of estimated needs. Add food coloring to feeds.     ASSESSMENT:  Pt admitted with gastric volvulus s/p ex lap, reduction of volvulus, splenectomy and Moss G tube placement  PMHx:  Past Medical History  Diagnosis Date  . Lupus (systemic lupus erythematosus)   . Pulmonary hypertension   . Pneumonia   . Unspecified menopausal and postmenopausal disorder   . Primary pulmonary hypertension   . Acute glomerulonephritis with other specified pathological lesion in kidney in disease classified elsewhere(580.81)   . Unspecified hypothyroidism   . Shingles (herpes zoster) polyneuropathy March 2013    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  . Hiatal hernia   . DVT (deep venous thrombosis)   . Cancer     Breast      Diet Order: NPO   Food/Nutrition-Related History: pt reports recent appetite has been good, eating well.    Medications: LR at 75 ml/hr, solumedrol  Digestive System: Moss G-tube with G-port to drainage; pt reports some abdominal pain, nausea during the night but none at present, no BM  Electrolyte/Renal Profile and Glucose Profile:   Recent Labs Lab 03/10/15 2354 03/12/15 0551  NA 144 143  K 3.9 4.3  CL 106 108  CO2 24 28  BUN 23* 18  CREATININE 1.33* 0.88  CALCIUM 9.7 8.5*  GLUCOSE 152* 112*   Protein Profile: No results for input(s): ALBUMIN in the last 168 hours.  Nutrition-Focused Physical Exam Findings: Nutrition-Focused physical exam completed. Findings are WDL for fat depletion, muscle depletion in all areas except calf and thigh which were mild/moderate. No edema  Weight Change: pt reports 10 pound wt loss since April when she had back surgery; 6% wt loss  Anthropometrics: Height:  Ht Readings from Last 1 Encounters:  03/11/15 5\' 2"  (1.575 m)    Weight:  Wt Readings from Last 1 Encounters:  03/12/15 160 lb 7.9 oz (72.8 kg)    Filed Weights   03/11/15 1137 03/12/15 1000  Weight: 153 lb 3.5 oz (69.5 kg) 160 lb 7.9 oz (72.8 kg)     BMI:  Body mass index is 29.35 kg/(m^2).  Estimated Nutritional Needs:  Kcal:  [id number] (BEE 1094, 1.3 AF, 1.2-1.3 IF) using IBW 50 kg  Protein:  60-70 g of protein (1.2-1.4 g/kg) using IBW 50 kg  Fluid:  1500-1750 mL (30-35 ml/kg) using IBW 50 kg  Diet Order:  Diet NPO time specified      Intake/Output Summary (Last 24 hours) at 03/12/15 1337 Last data filed at 03/12/15 1037  Gross per 24 hour  Intake 2230.08 ml  Output   2425 ml  Net -194.92 ml    HIGH Care Level  Kerman Passey MS, RD, LDN [phone] Pager

## 2015-03-12 NOTE — Progress Notes (Signed)
[facility] - [place] at [facility]   PATIENT NAME: Diana [name]    MR#:  [id number]  DATE OF BIRTH:  [birth year]  SUBJECTIVE:  CHIEF COMPLAINT:   Chief Complaint  Patient presents with  . Chest Pain  . Nausea  . Abdominal Pain   Pain controlled. Concerned that she is not getting meds. No new complaints.   REVIEW OF SYSTEMS:   Review of Systems  Constitutional: Negative for fever.  Respiratory: Negative for shortness of breath.   Cardiovascular: Negative for chest pain and palpitations.  Gastrointestinal: Positive for abdominal pain. Negative for nausea and vomiting.  Genitourinary: Negative for dysuria.    DRUG ALLERGIES:   Allergies  Allergen Reactions  . Amoxicillin Other (See Comments)    Reaction:  Stomach cramps   . Cefuroxime Itching  . Contrast Media [Iodinated Diagnostic Agents] Hives  . Cephalexin Rash  . Sulfonamide Derivatives Rash  . Tramadol Hcl Rash  . Venlafaxine Rash    VITALS:  Blood pressure 133/63, pulse 66, temperature 98.7 F (37.1 C), temperature source Oral, resp. rate 16, height 5\' 2"  (1.575 m), weight 72.8 kg (160 lb 7.9 oz), SpO2 96 %.  PHYSICAL EXAMINATION:  GENERAL:  80 y.o.-year-old patient lying in the bed with no acute distress.  EYES: Pupils equal, round, reactive to light and accommodation. No scleral icterus. Extraocular muscles intact.  HEENT: Head atraumatic, normocephalic. Oropharynx and nasopharynx clear. Moist mucus membranes NECK:  Supple, no jugular venous distention. No thyroid enlargement, no tenderness.  LUNGS: Normal breath sounds bilaterally, no wheezing, rales,rhonchi or crepitation. No use of accessory muscles of respiration.  CARDIOVASCULAR: S1, S2 normal. No murmurs, rubs, or gallops.  ABDOMEN: Soft, mild tenderness, bandage dressed, clean and dry, G tube in place, Bowel sounds present. No organomegaly or mass.  EXTREMITIES: No pedal edema, cyanosis, or clubbing.  NEUROLOGIC: Cranial  nerves II through XII are intact. Muscle strength 5/5 in all extremities. Sensation intact. Gait not checked.  PSYCHIATRIC: The patient is alert and oriented x 3.  SKIN: No obvious rash, lesion, or ulcer.   LABORATORY PANEL:   CBC  Recent Labs Lab 03/12/15 0551  WBC 8.5  HGB 9.7*  HCT 30.9*  PLT 209   ------------------------------------------------------------------------------------------------------------------  Chemistries   Recent Labs Lab 03/12/15 0551  NA 143  K 4.3  CL 108  CO2 28  GLUCOSE 112*  BUN 18  CREATININE 0.88  CALCIUM 8.5*   ------------------------------------------------------------------------------------------------------------------  Cardiac Enzymes  Recent Labs Lab 03/10/15 2354  TROPONINI <0.03   ------------------------------------------------------------------------------------------------------------------  RADIOLOGY:  Ct Abdomen Pelvis Wo Contrast  03/11/2015   CLINICAL DATA:  Initial evaluation for vomiting, abdominal pain.  EXAM: CT ABDOMEN AND PELVIS WITHOUT CONTRAST  TECHNIQUE: Multidetector CT imaging of the abdomen and pelvis was performed following the standard protocol without IV contrast.  COMPARISON:  None.  FINDINGS: Atelectatic changes noted within the partially visualized lung bases. Trace pericardial fluid noted. There is a small layering right pleural effusion.  Liver demonstrates a normal unenhanced appearance. Gallbladder within normal limits. No biliary dilatation. Spleen and adrenal glands are within normal limits. Fatty atrophy of the pancreas noted.  4 cm cyst present within the interpolar left kidney. Subcentimeter hypodense lesion within the inferior pole the right kidney is too small the characterize, but statistically likely reflects a small cyst as well. There is a punctate 2-3 mm nonobstructive right renal calculus. No hydronephrosis. No hydroureter.  The majority of the stomach is flipped superiorly into the chest.  The  gastric antrum is seen coursing to the right along the herniated portion of the stomach, and appears partially compressed. Large amount of fluid present within the stomach proximally. Finding suggestive of gastric volvulus with associated obstruction. Complete characterization of the volvulus somewhat difficult as the entirety of the hernia is not visualized on this examination. Large amount of fluid within the gastric lumen. Portion of the duodenum present within the chest is well. No evidence for bowel obstruction. Prominent colonic diverticulosis without evidence for acute diverticulitis. Appendix is normal. No acute inflammatory changes about the bowels.  Bladder within normal limits. Uterus and ovaries normal for patient age.  No free air or fluid no pathologically enlarged intra-abdominal or pelvic lymph nodes. Tortuosity the intrathoracic aorta present. Fairly mild atheromatous plaque for patient age within the infrarenal aorta. No aneurysm.  Scoliosis with associated multilevel degenerative changes present. Sequelae of prior vertebral augmentation present at L1 and L2. There is a chronic compression deformity at L1.  IMPRESSION: 1. Gastric volvulus with the majority of the stomach located within the chest. A portion of the distal stomach/ gastric antrum is positioned within the chest as well, and appears to be compressed as it courses through the right aspect of the hernia. Large amount of intraluminal fluid within the stomach proximally suggests that this is obstructive in nature. No evidence for obstruction distally within the abdomen and pelvis. 2. Small right pleural effusion. 3. Colonic diverticulosis without evidence for acute diverticulitis. 4. Punctate 2 -3 mm nonobstructive right renal calculus. 5. Scoliosis with multilevel degenerative disc disease and sequelae of prior vertebral augmentation at L1 and L2.   Electronically Signed   By: Rise Mu M.D.   On: 03/11/2015 04:08   Dg  Chest 2 View  03/11/2015   CLINICAL DATA:  Generalized chest pain, acute onset. Shortness of breath, nausea and vomiting. Diffuse abdominal pain. Initial encounter.  EXAM: CHEST  2 VIEW  COMPARISON:  Chest radiograph performed 11/13/2014  FINDINGS: The lungs are well-aerated. Mild vascular congestion is noted. Minimal scarring is noted at the left lung apex. There is no evidence of pleural effusion or pneumothorax.  The heart is borderline enlarged, though partially obscured by the patient's very large hiatal hernia. No acute osseous abnormalities are seen. The patient is status post vertebroplasty at the upper lumbar spine.  IMPRESSION: 1. Mild vascular congestion and borderline cardiomegaly. Lungs remain grossly clear. 2. Very large hiatal hernia seen.   Electronically Signed   By: Roanna Raider M.D.   On: 03/11/2015 00:43   Dg Chest Port 1 View  03/11/2015   CLINICAL DATA:  Postop volvulus of stomach.  EXAM: PORTABLE CHEST - 1 VIEW  COMPARISON:  March 11, 2015.  FINDINGS: Stable cardiomediastinal silhouette. Large hiatal hernia noted on prior exam is no longer present and has been surgically corrected. No pneumothorax is noted. Mild bibasilar opacities are noted most consistent with subsegmental atelectasis. Minimal left pleural effusion is noted as well. Bony thorax appears normal.  IMPRESSION: Mild bibasilar opacities are noted most consistent with subsegmental atelectasis. Minimal left pleural effusion.   Electronically Signed   By: Lupita Raider, M.D.   On: 03/11/2015 10:44   Dg Chest Port 1 View  03/11/2015   CLINICAL DATA:  Nasogastric tube placement. Chest pain, nausea and vomiting. Initial encounter.  EXAM: PORTABLE CHEST - 1 VIEW  COMPARISON:  CT of the abdomen and pelvis performed earlier today at 3:17 a.m.  FINDINGS: The patient's enteric tube is seen extending overlying the  stomach below the level of the diaphragm. The markedly dilated paraesophageal hernia is again seen, reflecting  obstruction due to compression of the distal stomach and pylorus on the right side of the hernia, as seen on CT.  Bibasilar atelectasis is noted. No pleural effusion or pneumothorax is seen.  The cardiomediastinal silhouette is borderline normal in size. No acute osseous abnormalities are identified.  IMPRESSION: Enteric tube noted extending overlying the stomach below the level of the diaphragm. Markedly dilated paraesophageal hernia again seen, reflecting obstruction due to compression of the distal stomach and pylorus on the right side of the hernia, as noted on CT.   Electronically Signed   By: Roanna Raider M.D.   On: 03/11/2015 05:55   US Abdomen Limited Ruq  03/11/2015   CLINICAL DATA:  Acute onset of right upper quadrant abdominal pain. Initial encounter.  EXAM: US ABDOMEN LIMITED - RIGHT UPPER QUADRANT  COMPARISON:  None.  FINDINGS: Gallbladder:  No gallstones or wall thickening visualized. Mildly distended but otherwise grossly unremarkable. No sonographic Murphy sign noted.  Common bile duct:  Diameter: 0.4 cm, within normal limits in caliber.  Liver:  No focal lesion identified. Within normal limits in parenchymal echogenicity.  IMPRESSION: Gallbladder mildly distended but otherwise grossly unremarkable. This may simply reflect the patient's NPO status. Unremarkable ultrasound of the right upper quadrant.   Electronically Signed   By: Roanna Raider M.D.   On: 03/11/2015 03:33    EKG:   Orders placed or performed during the hospital encounter of 03/10/15  . ED EKG (<70mins upon arrival to the ED)  . ED EKG (<51mins upon arrival to the ED)  . EKG 12-Lead  . EKG 12-Lead    ASSESSMENT AND PLAN:    Principal Problem:   Volvulus of stomach Active Problems:   Hypothyroidism   Systemic lupus erythematosus   Diabetes mellitus without complication   Pulmonary hypertension  1: Volvulus of stomach: Per primary team, surgery. Seems to be doing well after exploratory laparotomy with  reduction of volvulus, incidental splenectomy and gastropexy.  #2 pulmonary hypertension: We'll restart tadalafil via feeding tube if possible. Respiratory status is stable.  #3  Hypothyroidism:  Continue synthroid, switch to oral/per tube.  #4  SLE: restart plaquenil and cellcept. Is on IV steroids to replace chronic daily prednisone.  Switch back to oral when possible.  #5  DM: no oral intake currently. CBG's stable. SSI  #6  History of DVT: continue treatment as above for SLE.  INR 2.26, no coumadin at this time. Hold off on DVT prophy as hgb has gone from 12.1>> 9.7 after surgery.  All the records are reviewed and case discussed with Care Management/Social Workerr. Management plans discussed with the patient, family and they are in agreement.  CODE STATUS: FULL  TOTAL TIME TAKING CARE OF THIS PATIENT: 35 minutes.   POSSIBLE D/C IN 3-4 DAYS, DEPENDING ON CLINICAL CONDITION.   Elby Showers M.D on 03/12/2015 at 1:36 PM  Between 7am to 6pm - Pager - [phone]  After 6pm go to [email or web address] - password [email or web address] [facility]  [place] [facility]  Office  [phone]  CC: Primary care physician; Sherlene Shams, MD

## 2015-03-12 NOTE — Progress Notes (Signed)
Dr. Bary Castilla paged and spoken to in regard to flush orders through G-tube. Order stated to flush with sterile water. Dr. Bary Castilla gave orders to give flush through pump and does not have to be sterile.

## 2015-03-13 ENCOUNTER — Inpatient Hospital Stay: Payer: Medicare Other

## 2015-03-13 LAB — BASIC METABOLIC PANEL
ANION GAP: 7 (ref 5–15)
BUN: 15 mg/dL (ref 6–20)
CALCIUM: 8.7 mg/dL — AB (ref 8.9–10.3)
CHLORIDE: 106 mmol/L (ref 101–111)
CO2: 30 mmol/L (ref 22–32)
CREATININE: 0.83 mg/dL (ref 0.44–1.00)
GFR calc Af Amer: >60 mL/min (ref >60)
GFR calc non Af Amer: >60 mL/min (ref >60)
Glucose, Bld: 179 mg/dL — ABNORMAL HIGH (ref 65–99)
Potassium: 3.9 mmol/L (ref 3.5–5.1)
Sodium: 143 mmol/L (ref 135–145)

## 2015-03-13 LAB — CBC
HEMATOCRIT: 27.7 % — AB (ref 35.0–47.0)
HEMOGLOBIN: 9 g/dL — AB (ref 12.0–16.0)
MCH: 30.7 pg (ref 26.0–34.0)
MCHC: 32.7 g/dL (ref 32.0–36.0)
MCV: 93.9 fL (ref 80.0–100.0)
Platelets: 224 10*3/uL (ref 150–440)
RBC: 2.95 MIL/uL — ABNORMAL LOW (ref 3.80–5.20)
RDW: 16.8 % — ABNORMAL HIGH (ref 11.5–14.5)
WBC: 8.8 10*3/uL (ref 3.6–11.0)

## 2015-03-13 LAB — GLUCOSE, CAPILLARY
GLUCOSE-CAPILLARY: 139 mg/dL — AB (ref 65–99)
Glucose-Capillary: 161 mg/dL — ABNORMAL HIGH (ref 65–99)
Glucose-Capillary: 112 mg/dL — ABNORMAL HIGH (ref 65–99)
Glucose-Capillary: 148 mg/dL — ABNORMAL HIGH (ref 65–99)
Glucose-Capillary: 174 mg/dL — ABNORMAL HIGH (ref 65–99)

## 2015-03-13 LAB — PROTIME-INR
INR: 2.21
Prothrombin Time: 24.7 seconds — ABNORMAL HIGH (ref 11.4–15.0)

## 2015-03-13 MED ORDER — ACETAMINOPHEN 160 MG/5ML PO SOLN
650.0000 mg | ORAL | Status: DC | PRN
  Filled 2015-03-13: qty 20.3

## 2015-03-13 MED ORDER — MYCOPHENOLATE MOFETIL 200 MG/ML PO SUSR
1000.0000 mg | Freq: Two times a day (BID) | ORAL | Status: DC
  Administered 2015-03-13: 1000 mg via ORAL
  Filled 2015-03-13 (×2): qty 5

## 2015-03-13 MED ORDER — PANTOPRAZOLE SODIUM 40 MG PO TBEC
40.0000 mg | DELAYED_RELEASE_TABLET | Freq: Every day | ORAL | Status: DC
  Administered 2015-03-13 – 2015-03-20 (×8): 40 mg via ORAL
  Filled 2015-03-13 (×7): qty 1

## 2015-03-13 MED ORDER — MYCOPHENOLATE MOFETIL 200 MG/ML PO SUSR
1000.0000 mg | Freq: Two times a day (BID) | ORAL | Status: DC
  Administered 2015-03-13 – 2015-03-19 (×13): 1000 mg via ORAL
  Filled 2015-03-13 (×17): qty 5

## 2015-03-13 NOTE — Progress Notes (Signed)
[facility] - [place] at [facility]   PATIENT NAME: Diana [name]    MR#:  [id number]  DATE OF BIRTH:  09/25/1934  SUBJECTIVE:  CHIEF COMPLAINT:   Chief Complaint  Patient presents with  . Chest Pain  . Nausea  . Abdominal Pain   Uncomfortable, abdominal pain and short shallow respirations.  REVIEW OF SYSTEMS:   Review of Systems  Constitutional: Negative for fever.  Respiratory: Negative for shortness of breath.   Cardiovascular: Negative for chest pain and palpitations.  Gastrointestinal: Positive for abdominal pain. Negative for nausea and vomiting.  Genitourinary: Negative for dysuria.    DRUG ALLERGIES:   Allergies  Allergen Reactions  . Amoxicillin Other (See Comments)    Reaction:  Stomach cramps   . Cefuroxime Itching  . Contrast Media [Iodinated Diagnostic Agents] Hives  . Cephalexin Rash  . Sulfonamide Derivatives Rash  . Tramadol Hcl Rash  . Venlafaxine Rash    VITALS:  Blood pressure 127/62, pulse 62, temperature 98.4 F (36.9 C), temperature source Oral, resp. rate 16, height 5\' 2"  (1.575 m), weight 72.53 kg (159 lb 14.4 oz), SpO2 94 %.  PHYSICAL EXAMINATION:  GENERAL:  80 y.o.-year-old patient lying in the bed with no acute distress.  EYES: Pupils equal, round, reactive to light and accommodation. No scleral icterus. Extraocular muscles intact.  HEENT: Head atraumatic, normocephalic. Oropharynx and nasopharynx clear. Moist mucus membranes NECK:  Supple, no jugular venous distention. No thyroid enlargement, no tenderness.  LUNGS: Normal breath sounds bilaterally, no wheezing, rales, rhonchi or crepitation. No use of accessory muscles of respiration.  CARDIOVASCULAR: S1, S2 normal. No murmurs, rubs, or gallops.  ABDOMEN: Soft, mild tenderness, bandage dressed, clean and dry, G tube in place, Bowel sounds present. No organomegaly or mass.  EXTREMITIES: No pedal edema, cyanosis, or clubbing.  NEUROLOGIC: Cranial nerves II  through XII are intact. Muscle strength 5/5 in all extremities. Sensation intact. Gait not checked.  PSYCHIATRIC: The patient is alert and oriented x 3.  SKIN: No obvious rash, lesion, or ulcer.   LABORATORY PANEL:   CBC  Recent Labs Lab 03/13/15 0526  WBC 8.8  HGB 9.0*  HCT 27.7*  PLT 224   ------------------------------------------------------------------------------------------------------------------  Chemistries   Recent Labs Lab 03/13/15 0526  NA 143  K 3.9  CL 106  CO2 30  GLUCOSE 179*  BUN 15  CREATININE 0.83  CALCIUM 8.7*   ------------------------------------------------------------------------------------------------------------------  Cardiac Enzymes  Recent Labs Lab 03/10/15 2354  TROPONINI <0.03   ------------------------------------------------------------------------------------------------------------------  RADIOLOGY:  Dg Abd 2 Views  03/13/2015   CLINICAL DATA:  Abdominal surgery.  EXAM: ABDOMEN - 2 VIEW  COMPARISON:  CT 03/11/2015.  FINDINGS: Postsurgical changes in abdomen in this patient with prior large hiatal hernia with gastric volvulus. Surgical drainage tubing noted over the abdomen. No evidence of gastric hernia or bowel obstruction. No free air. Lumbar scoliosis. Prior kyphoplasty. Bibasilar subsegmental atelectasis and tiny pleural effusions.  IMPRESSION: 1. Postsurgical change of the abdomen in this patient with prior large hiatal hernia with gastric volvulus . No evidence of gastric hernia or bowel obstruction.  2.  Bibasilar subsegmental atelectasis and tiny pleural effusions.   Electronically Signed   By: Maisie Fus  Register   On: 03/13/2015 08:45    EKG:   Orders placed or performed during the hospital encounter of 03/10/15  . ED EKG (<17mins upon arrival to the ED)  . ED EKG (<88mins upon arrival to the ED)  . EKG 12-Lead  . EKG 12-Lead  ASSESSMENT AND PLAN:    Principal Problem:   Volvulus of stomach Active Problems:    Hypothyroidism   Systemic lupus erythematosus   Diabetes mellitus without complication   Pulmonary hypertension  1: Volvulus of stomach: Per primary team, surgery. Seems to be doing well after exploratory laparotomy with reduction of volvulus, incidental splenectomy and gastropexy. Still with pain at incision site, to be expected. Working with PT.  #2 pulmonary hypertension: We'll restart tadalafil PO today. She feels short of breath, I think due to splinting. Continue incentive spirometer, PT.   #3  Hypothyroidism:  Continue synthroid, switch to oral  #4  SLE: restart plaquenil and cellcept, prednisone  #5  DM: no oral intake currently. CBG's stable. SSI  #6  History of DVT: continue treatment as above for SLE.  INR 2.21, no coumadin at this time. Hold off on DVT prophy as hgb has gone from 12.1>> 9.0 after surgery.  All the records are reviewed and case discussed with Care Management/Social Workerr. Management plans discussed with the patient, family and they are in agreement.  CODE STATUS: FULL  TOTAL TIME TAKING CARE OF THIS PATIENT: 35 minutes.   POSSIBLE D/C IN 3-4 DAYS, DEPENDING ON CLINICAL CONDITION.   Elby Showers M.D on 03/13/2015 at 1:55 PM  Between 7am to 6pm - Pager - [phone]  After 6pm go to [email or web address] - password [email or web address] [facility]  [place]  Office  [phone]  CC: Primary care physician; Sherlene Shams, MD

## 2015-03-13 NOTE — Progress Notes (Addendum)
Modest fall in HGB, PT INR remains elevated. Some reflux of TF back into stomach. Will check KUB.  AVSS.Has ambulated in the room. No flatus. Some heartburn w/ GT flush today. GT drainage notable for some reflux from Tube feedings.  Lungs: Clear. Patient reports feeling increased SOB off Cellcept for her SLE. May simply be fluid shifts post op. Sat's 90 +/-. Cardio: RR. ABD: Mild distension, BS +. Soft. Decreased tenderness.  KUB: Moss gastrostomy tube in good position.  No gastric distension or dominant small bowel air. Bibasilar atectasis.   IMP: Doing well. Plan: D/C SCD as they impair ambulation and she is therapeutic on her coumadin.  Would start Lovenox when INR falls below 1.5.  Plan: Clamp GT. Meds PO. Ambulation, ambulation, ambulation.  Dr. Jamal Collin will be covering for surgery until my return on Tuesday, June 28th.

## 2015-03-13 NOTE — Progress Notes (Signed)
Nutrition Follow-up    INTERVENTION:  Tube feeding: Jevity 1.2 continues at 70ml/hr at this time.   NUTRITION DIAGNOSIS:  Inadequate oral intake related to altered GI function, acute illness as evidenced by NPO status.    GOAL:  Patient will meet greater than or equal to 90% of their needs    MONITOR:   (EN, Digestive System, Anthropometrics, Electrolyte/Renal Profile, Glucose Profile, Energy Intake)  REASON FOR ASSESSMENT:  Consult Enteral/tube feeding initiation and management  ASSESSMENT:  Noted per MD note G-tube drainage notable for tube feedings, KUB this am noted.  Planning to clamp G- tube today and ambulate pt. Pt reports reflux this am to this Probation officer. Planning to start medications po today.  Electrolyte and Renal Profile:  Recent Labs Lab 03/10/15 2354 03/12/15 0551 03/13/15 0526  BUN 23* 18 15  CREATININE 1.33* 0.88 0.83  NA 144 143 143  K 3.9 4.3 3.9   Medications: LR at 47ml/hr  Height:  Ht Readings from Last 1 Encounters:  03/11/15 5\' 2"  (1.575 m)    Weight:  Wt Readings from Last 1 Encounters:  03/13/15 159 lb 14.4 oz (72.53 kg)       BMI:  Body mass index is 29.24 kg/(m^2).  Estimated Nutritional Needs:  Kcal:  1707-1849 (BEE 1094, 1.3 AF, 1.2-1.3 IF) using IBW 50 kg  Protein:  60-70 g of protein (1.2-1.4 g/kg) using IBW 50 kg  Fluid:  1500-1750 mL (30-35 ml/kg) using IBW 50 kg   Diet Order:  Diet NPO time specified  EDUCATION NEEDS:      Intake/Output Summary (Last 24 hours) at 03/13/15 1416 Last data filed at 03/13/15 1230  Gross per 24 hour  Intake 3072.69 ml  Output   2050 ml  Net 1022.69 ml    Last BM:  6/21  HIGH Care Level Diana [name], [place], [place] (pager)

## 2015-03-13 NOTE — Progress Notes (Deleted)
MD notified of pt reaction to hydralazine (nausea, dizziness, heart palpitations). MD also made aware of critical potassium. Pt c/o nausea x 1, PRN zofran given and effective.

## 2015-03-14 LAB — CBC WITH DIFFERENTIAL/PLATELET
Basophils Absolute: 0 10*3/uL (ref 0–0.1)
Basophils Relative: 0 %
EOS PCT: 0 %
Eosinophils Absolute: 0 10*3/uL (ref 0–0.7)
HEMATOCRIT: 28.5 % — AB (ref 35.0–47.0)
Hemoglobin: 9.3 g/dL — ABNORMAL LOW (ref 12.0–16.0)
LYMPHS PCT: 8 %
Lymphs Abs: 0.6 10*3/uL — ABNORMAL LOW (ref 1.0–3.6)
MCH: 30.7 pg (ref 26.0–34.0)
MCHC: 32.7 g/dL (ref 32.0–36.0)
MCV: 93.8 fL (ref 80.0–100.0)
MONOS PCT: 6 %
Monocytes Absolute: 0.4 10*3/uL (ref 0.2–0.9)
Neutro Abs: 6.4 10*3/uL (ref 1.4–6.5)
Neutrophils Relative %: 86 %
PLATELETS: 250 10*3/uL (ref 150–440)
RBC: 3.04 MIL/uL — AB (ref 3.80–5.20)
RDW: 16.9 % — ABNORMAL HIGH (ref 11.5–14.5)
WBC: 7.4 10*3/uL (ref 3.6–11.0)

## 2015-03-14 LAB — BASIC METABOLIC PANEL
Anion gap: 7 (ref 5–15)
BUN: 14 mg/dL (ref 6–20)
CALCIUM: 9 mg/dL (ref 8.9–10.3)
CHLORIDE: 106 mmol/L (ref 101–111)
CO2: 30 mmol/L (ref 22–32)
Creatinine, Ser: 0.8 mg/dL (ref 0.44–1.00)
GFR calc Af Amer: >60 mL/min (ref >60)
Glucose, Bld: 164 mg/dL — ABNORMAL HIGH (ref 65–99)
Potassium: 4 mmol/L (ref 3.5–5.1)
Sodium: 143 mmol/L (ref 135–145)

## 2015-03-14 LAB — GLUCOSE, CAPILLARY
GLUCOSE-CAPILLARY: 104 mg/dL — AB (ref 65–99)
GLUCOSE-CAPILLARY: 108 mg/dL — AB (ref 65–99)
GLUCOSE-CAPILLARY: 112 mg/dL — AB (ref 65–99)
GLUCOSE-CAPILLARY: 132 mg/dL — AB (ref 65–99)
Glucose-Capillary: 106 mg/dL — ABNORMAL HIGH (ref 65–99)
Glucose-Capillary: 110 mg/dL — ABNORMAL HIGH (ref 65–99)
Glucose-Capillary: 160 mg/dL — ABNORMAL HIGH (ref 65–99)

## 2015-03-14 LAB — PROTIME-INR
INR: 1.7
PROTHROMBIN TIME: 20.2 s — AB (ref 11.4–15.0)

## 2015-03-14 NOTE — Progress Notes (Signed)
Nutrition Follow-up       INTERVENTION:  Tube feeding: Jevity 1.2 to continue at 69ml/hr today per Dr. Angie Fava note.  NUTRITION DIAGNOSIS:  Inadequate oral intake related to altered GI function, acute illness as evidenced by NPO status.    GOAL:  Patient will meet greater than or equal to 90% of their needs    MONITOR:   (EN, Digestive System, Anthropometrics, Electrolyte/Renal Profile, Glucose Profile, Energy Intake)  REASON FOR ASSESSMENT:  Consult Enteral/tube feeding initiation and management  ASSESSMENT:  Chart reviewed   Current Nutrition: Tolerating jevity 1.2 at 22ml/hr at this time   Gastrointestinal Profile: no BM or passing flatus, some heartburn noted, hypoactive bowel sounds. Spoke with RN, Annabella  Last BM: 6/21   Medications: reviewed  Electrolyte/Renal Profile and Glucose Profile:   Recent Labs Lab 03/12/15 0551 03/13/15 0526 03/14/15 0457  NA 143 143 143  K 4.3 3.9 4.0  CL 108 106 106  CO2 28 30 30   BUN 18 15 14   CREATININE 0.88 0.83 0.80  CALCIUM 8.5* 8.7* 9.0  GLUCOSE 112* 179* 164*   Protein Profile: No results for input(s): ALBUMIN in the last 168 hours.    Weight Trend since Admission: Filed Weights   03/12/15 1000 03/13/15 0600 03/14/15 0500  Weight: 160 lb 7.9 oz (72.8 kg) 159 lb 14.4 oz (72.53 kg) 152 lb 1.6 oz (68.992 kg)      Height:  Ht Readings from Last 1 Encounters:  03/11/15 5\' 2"  (1.575 m)    Weight:  Wt Readings from Last 1 Encounters:  03/14/15 152 lb 1.6 oz (68.992 kg)     BMI:  Body mass index is 27.81 kg/(m^2).  Estimated Nutritional Needs:  Kcal:  1707-1849 (BEE 1094, 1.3 AF, 1.2-1.3 IF) using IBW 50 kg  Protein:  60-70 g of protein (1.2-1.4 g/kg) using IBW 50 kg  Fluid:  1500-1750 mL (30-35 ml/kg) using IBW 50 kg   Diet Order:  Diet NPO time specified  EDUCATION NEEDS:      Intake/Output Summary (Last 24 hours) at 03/14/15 1234 Last data filed at 03/14/15 1229  Gross  per 24 hour  Intake 1422.34 ml  Output   2450 ml  Net -1027.66 ml     HIGH Care Level Tevis Dunavan B. Zenia Resides, [place], [place] (pager)

## 2015-03-14 NOTE — Progress Notes (Signed)
[facility] Physicians - [place] at [facility]   PATIENT NAME: Diana [name]    MR#:  [id number]  DATE OF BIRTH:  [birth year]  SUBJECTIVE:  CHIEF COMPLAINT:   Chief Complaint  Patient presents with  . Chest Pain  . Nausea  . Abdominal Pain   Uncomfortable, complains of abdominal pain and short shallow respirations. Admits of constipation and last bowel movement approximately 3-4 days ago  REVIEW OF SYSTEMS:   Review of Systems  Constitutional: Negative for fever.  Respiratory: Negative for shortness of breath.   Cardiovascular: Negative for chest pain and palpitations.  Gastrointestinal: Positive for abdominal pain. Negative for nausea and vomiting.  Genitourinary: Negative for dysuria.    DRUG ALLERGIES:   Allergies  Allergen Reactions  . Amoxicillin Other (See Comments)    Reaction:  Stomach cramps   . Cefuroxime Itching  . Contrast Media [Iodinated Diagnostic Agents] Hives  . Cephalexin Rash  . Sulfonamide Derivatives Rash  . Tramadol Hcl Rash  . Venlafaxine Rash    VITALS:  Blood pressure 135/74, pulse 66, temperature 98.1 F (36.7 C), temperature source Oral, resp. rate 20, height 5\' 2"  (1.575 m), weight 68.992 kg (152 lb 1.6 oz), SpO2 94 %.  PHYSICAL EXAMINATION:  GENERAL:  80 y.o.-year-old patient lying in the bed with no acute distress.  EYES: Pupils equal, round, reactive to light and accommodation. No scleral icterus. Extraocular muscles intact.  HEENT: Head atraumatic, normocephalic. Oropharynx and nasopharynx clear. Moist mucus membranes NECK:  Supple, no jugular venous distention. No thyroid enlargement, no tenderness.  LUNGS: Normal breath sounds bilaterally, no wheezing, rales, rhonchi or crepitation. No use of accessory muscles of respiration.  CARDIOVASCULAR: S1, S2 normal. No murmurs, rubs, or gallops.  ABDOMEN: Soft, mild tenderness in upper abdomen, bandage dressed, clean and dry, G tube in place, Bowel sounds good. No organomegaly  or mass.  EXTREMITIES: No pedal edema, cyanosis, or clubbing.  NEUROLOGIC: Cranial nerves II through XII are intact. Muscle strength 5/5 in all extremities. Sensation intact. Gait not checked.  PSYCHIATRIC: The patient is alert and oriented x 3.  SKIN: No obvious rash, lesion, or ulcer.   LABORATORY PANEL:   CBC  Recent Labs Lab 03/14/15 0457  WBC 7.4  HGB 9.3*  HCT 28.5*  PLT 250   ------------------------------------------------------------------------------------------------------------------  Chemistries   Recent Labs Lab 03/14/15 0457  NA 143  K 4.0  CL 106  CO2 30  GLUCOSE 164*  BUN 14  CREATININE 0.80  CALCIUM 9.0   ------------------------------------------------------------------------------------------------------------------  Cardiac Enzymes  Recent Labs Lab 03/10/15 2354  TROPONINI <0.03   ------------------------------------------------------------------------------------------------------------------  RADIOLOGY:  Dg Abd 2 Views  03/13/2015   CLINICAL DATA:  Abdominal surgery.  EXAM: ABDOMEN - 2 VIEW  COMPARISON:  CT 03/11/2015.  FINDINGS: Postsurgical changes in abdomen in this patient with prior large hiatal hernia with gastric volvulus. Surgical drainage tubing noted over the abdomen. No evidence of gastric hernia or bowel obstruction. No free air. Lumbar scoliosis. Prior kyphoplasty. Bibasilar subsegmental atelectasis and tiny pleural effusions.  IMPRESSION: 1. Postsurgical change of the abdomen in this patient with prior large hiatal hernia with gastric volvulus . No evidence of gastric hernia or bowel obstruction.  2.  Bibasilar subsegmental atelectasis and tiny pleural effusions.   Electronically Signed   By: Maisie Fus  Register   On: 03/13/2015 08:45    EKG:   Orders placed or performed during the hospital encounter of 03/10/15  . ED EKG (<65mins upon arrival to the ED)  .  ED EKG (<6mins upon arrival to the ED)  . EKG 12-Lead  . EKG 12-Lead     ASSESSMENT AND PLAN:    Principal Problem:   Volvulus of stomach Active Problems:   Hypothyroidism   Systemic lupus erythematosus   Diabetes mellitus without complication   Pulmonary hypertension  1: Volvulus of stomach: Per primary team, surgery. Seems to be doing well after exploratory laparotomy with reduction of volvulus, incidental splenectomy and gastropexy. Still with pain at incision site, to be expected. Working with PT. discussed with Dr. Evette Cristal and no further recommendations were made , except of encouraging ambulation  #2 pulmonary hypertension: Restarted tadalafil . She feels short of breath, I think due to splinting. Continue incentive spirometer, PT.   #3  Hypothyroidism:  Continue synthroid, switched to oral  #4  SLE: restarted plaquenil and cellcept, prednisone  #5  DM: no oral intake currently. CBG's stable. SSI  #6  History of DVT: continue treatment as above for SLE.  INR 2.21, coumadin management per pharmacy  #7. Acute post hemorrhagic anemia , hemoglobin decreased from 12.1 to 9.0 after surgery. Remains stable and I feel that this okay to restart the patient on Coumadin therapy as long as this okay with surgery  All the records are reviewed and case discussed with Care Management/Social Workerr. Management plans discussed with the patient, family and they are in agreement.  CODE STATUS: FULL  TOTAL TIME TAKING CARE OF THIS PATIENT: 35 minutes.   POSSIBLE D/C IN 3-4 DAYS, DEPENDING ON CLINICAL CONDITION.   Katharina Caper M.D on 03/14/2015 at 11:08 AM  Between 7am to 6pm - Pager - [phone]  After 6pm go to [email or web address] - password [email or web address] [facility]  [place] [facility]  Office  [phone]  CC: Primary care physician; Sherlene Shams, MD

## 2015-03-14 NOTE — Progress Notes (Signed)
Patient ID: Diana [name], female   DOB: 13-Jan-1935, 80 y.o.   MRN: [id number] Patient complains of a little bit of heartburn last night. Still has some reflux with tube feeds into the stomach with drainage seen in the gastrostomy bag. She is afebrile and vital signs are stable. Labs are all stable. ProTime still at 20. Abdomen is soft bowel sounds are active. Patient has not had a bowel movement nor she passing any gas. Incision appears to be intact and clean as also the gastrostomy tube. The heartburn may be related to reflux of the tube feeds in the stomach. Given the significant distention of the stomach at time of surgery, will move slowly towards initiating oral intake. No change with a gastrostomy drainage or tube feedings for today. Patient has not walked much outside of her room and encouraged to do so today.

## 2015-03-15 LAB — CBC
HEMATOCRIT: 29.9 % — AB (ref 35.0–47.0)
HEMOGLOBIN: 9.5 g/dL — AB (ref 12.0–16.0)
MCH: 29.9 pg (ref 26.0–34.0)
MCHC: 31.6 g/dL — ABNORMAL LOW (ref 32.0–36.0)
MCV: 94.4 fL (ref 80.0–100.0)
Platelets: 288 10*3/uL (ref 150–440)
RBC: 3.16 MIL/uL — ABNORMAL LOW (ref 3.80–5.20)
RDW: 16.4 % — AB (ref 11.5–14.5)
WBC: 5.8 10*3/uL (ref 3.6–11.0)

## 2015-03-15 LAB — GLUCOSE, CAPILLARY
GLUCOSE-CAPILLARY: 123 mg/dL — AB (ref 65–99)
Glucose-Capillary: 122 mg/dL — ABNORMAL HIGH (ref 65–99)
Glucose-Capillary: 157 mg/dL — ABNORMAL HIGH (ref 65–99)
Glucose-Capillary: 116 mg/dL — ABNORMAL HIGH (ref 65–99)

## 2015-03-15 LAB — TYPE AND SCREEN
ABO/RH(D): O POS
Antibody Screen: NEGATIVE
Unit division: 0
Unit division: 0

## 2015-03-15 LAB — PROTIME-INR
INR: 1.18
Prothrombin Time: 15.2 seconds — ABNORMAL HIGH (ref 11.4–15.0)

## 2015-03-15 LAB — APTT: aPTT: 31 seconds (ref 24–36)

## 2015-03-15 MED ORDER — HEPARIN (PORCINE) IN NACL 100-0.45 UNIT/ML-% IJ SOLN
900.0000 [IU]/h | INTRAMUSCULAR | Status: DC
  Filled 2015-03-15 (×2): qty 250

## 2015-03-15 MED ORDER — BISACODYL 10 MG RE SUPP
10.0000 mg | Freq: Once | RECTAL | Status: AC
  Administered 2015-03-15: 10 mg via RECTAL
  Filled 2015-03-15: qty 1

## 2015-03-15 MED ORDER — ONDANSETRON HCL 4 MG/2ML IJ SOLN
4.0000 mg | Freq: Once | INTRAMUSCULAR | Status: AC
  Administered 2015-03-15: 4 mg via INTRAVENOUS
  Filled 2015-03-15: qty 2

## 2015-03-15 MED ORDER — ENOXAPARIN SODIUM 80 MG/0.8ML ~~LOC~~ SOLN
1.0000 mg/kg | Freq: Two times a day (BID) | SUBCUTANEOUS | Status: DC
  Administered 2015-03-15 – 2015-03-20 (×11): 65 mg via SUBCUTANEOUS
  Filled 2015-03-15 (×13): qty 0.8

## 2015-03-15 NOTE — Progress Notes (Addendum)
Patient ID: Diana [name], female   DOB: 1935-01-06, 80 y.o.   MRN: [id number] Patient states that she experiences a fair amount of heartburn every time she gets tube feeding. Also complains of still moderate pain when changing position from sitting or lying to get him out of bed. She uses the PCA only when she is just getting out of bed to sit or going back to bed. Hasn't passed any gas and no bowel movement yet. She is afebrile and vital signs are stable. Abdomen is soft and does have good bowel sounds. Incision looks clean. Gastrostomy tube is intact Lungs are clear We'll attempt capping off to gastrostomy port today with instruction to reconnected drainage when necessary. If she tolerates this will attempt the increasing her oral intake tomorrow. Discussed with nursing and patient Her pro time INR is down to 1.5. Started on Lovenox today

## 2015-03-15 NOTE — Progress Notes (Signed)
ANTICOAGULATION CONSULT NOTE - Initial Consult  Pharmacy Consult for Lovenox Indication: pulmonary embolus  Allergies  Allergen Reactions  . Amoxicillin Other (See Comments)    Reaction:  Stomach cramps   . Cefuroxime Itching  . Contrast Media [Iodinated Diagnostic Agents] Hives  . Cephalexin Rash  . Sulfonamide Derivatives Rash  . Tramadol Hcl Rash  . Venlafaxine Rash    Patient Measurements: Height: 5\' 2"  (157.5 cm) Weight: 144 lb 8 oz (65.545 kg) IBW/kg (Calculated) : 50.1 Heparin Dosing Weight: n/a  Vital Signs: Pulse Rate: 89 (06/26 0750)  Labs:  Recent Labs  03/13/15 0526 03/14/15 0457 03/15/15 0457 03/15/15 0547  HGB 9.0* 9.3*  --  9.5*  HCT 27.7* 28.5*  --  29.9*  PLT 224 250  --  288  APTT  --   --   --  31  LABPROT 24.7* 20.2* 15.2*  --   INR 2.21 1.70 1.18  --   CREATININE 0.83 0.80  --   --     Estimated Creatinine Clearance: 51.5 mL/min (by C-G formula based on Cr of 0.8).   Medical History: Past Medical History  Diagnosis Date  . Lupus (systemic lupus erythematosus)   . Pulmonary hypertension   . Pneumonia   . Unspecified menopausal and postmenopausal disorder   . Primary pulmonary hypertension   . Acute glomerulonephritis with other specified pathological lesion in kidney in disease classified elsewhere(580.81)   . Unspecified hypothyroidism   . Shingles (herpes zoster) polyneuropathy March 2013    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  . Hiatal hernia   . DVT (deep venous thrombosis)   . Cancer     Breast    Medications:  Scheduled:  . budesonide-formoterol  2 puff Inhalation BID  . enoxaparin (LOVENOX) injection  1 mg/kg Subcutaneous Q12H  . fluticasone  2 spray Each Nare Daily  . free water  25 mL Per Tube 6 times per day  . hydroxychloroquine  200 mg Oral Daily  . letrozole  2.5 mg Oral Daily  . levothyroxine  75 mcg Intravenous Daily  . methylPREDNISolone (SOLU-MEDROL) injection  10 mg Intravenous Q12H  . morphine   Intravenous  6 times per day  . mycophenolate  1,000 mg Oral BID  . pantoprazole  40 mg Oral Daily  . Tadalafil (PAH)  40 mg Oral Daily    Assessment: Patient with history of DVT/PE and has been off Coumadin since admission. To begin Lovenox.  Goal of Therapy:  Monitor SCr Monitor platelets by anticoagulation protocol: Yes   Plan:  Lovenox 1mg /kg (65mg ) SQ q12h.  Paulina Fusi, PharmD, BCPS 03/15/2015 1:41 PM

## 2015-03-15 NOTE — Progress Notes (Signed)
[facility] Physicians - [place] at [facility]   PATIENT NAME: Diana [name]    MR#:  [id number]  DATE OF BIRTH:  1935/05/09  SUBJECTIVE:  CHIEF COMPLAINT:   Chief Complaint  Patient presents with  . Chest Pain  . Nausea  . Abdominal Pain   Uncomfortable, complains of abdominal pain and short shallow respirations. Admits of constipation and last bowel movement approximately 3-4 days ago. No significant change since yesterday. No bowel movements yet, but passing some gas. Tries to sip water, some ice chips  REVIEW OF SYSTEMS:   Review of Systems  Constitutional: Negative for fever.  Respiratory: Negative for shortness of breath.   Cardiovascular: Negative for chest pain and palpitations.  Gastrointestinal: Positive for abdominal pain. Negative for nausea and vomiting.  Genitourinary: Negative for dysuria.    DRUG ALLERGIES:   Allergies  Allergen Reactions  . Amoxicillin Other (See Comments)    Reaction:  Stomach cramps   . Cefuroxime Itching  . Contrast Media [Iodinated Diagnostic Agents] Hives  . Cephalexin Rash  . Sulfonamide Derivatives Rash  . Tramadol Hcl Rash  . Venlafaxine Rash    VITALS:  Blood pressure 155/76, pulse 89, temperature 98.3 F (36.8 C), temperature source Oral, resp. rate 13, height 5\' 2"  (1.575 m), weight 65.545 kg (144 lb 8 oz), SpO2 95 %.  PHYSICAL EXAMINATION:  GENERAL:  80 y.o.-year-old patient lying in the bed with no acute distress.  EYES: Pupils equal, round, reactive to light and accommodation. No scleral icterus. Extraocular muscles intact.  HEENT: Head atraumatic, normocephalic. Oropharynx and nasopharynx clear. Moist mucus membranes NECK:  Supple, no jugular venous distention. No thyroid enlargement, no tenderness.  LUNGS: Normal breath sounds bilaterally, no wheezing, rales, rhonchi or crepitation. No use of accessory muscles of respiration.  CARDIOVASCULAR: S1, S2 normal. No murmurs, rubs, or gallops.  ABDOMEN: Soft,  mild tenderness in upper abdomen, bandage dressed, clean and dry, G tube in place, Bowel sounds good. No organomegaly or mass.  EXTREMITIES: No pedal edema, cyanosis, or clubbing.  NEUROLOGIC: Cranial nerves II through XII are intact. Muscle strength 5/5 in all extremities. Sensation intact. Gait not checked.  PSYCHIATRIC: The patient is alert and oriented x 3.  SKIN: No obvious rash, lesion, or ulcer.   LABORATORY PANEL:   CBC  Recent Labs Lab 03/14/15 0457  WBC 7.4  HGB 9.3*  HCT 28.5*  PLT 250   ------------------------------------------------------------------------------------------------------------------  Chemistries   Recent Labs Lab 03/14/15 0457  NA 143  K 4.0  CL 106  CO2 30  GLUCOSE 164*  BUN 14  CREATININE 0.80  CALCIUM 9.0   ------------------------------------------------------------------------------------------------------------------  Cardiac Enzymes  Recent Labs Lab 03/10/15 2354  TROPONINI <0.03   ------------------------------------------------------------------------------------------------------------------  RADIOLOGY:  No results found.  EKG:   Orders placed or performed during the hospital encounter of 03/10/15  . ED EKG (<25mins upon arrival to the ED)  . ED EKG (<74mins upon arrival to the ED)  . EKG 12-Lead  . EKG 12-Lead    ASSESSMENT AND PLAN:    Principal Problem:   Volvulus of stomach Active Problems:   Hypothyroidism   Systemic lupus erythematosus   Diabetes mellitus without complication   Pulmonary hypertension  1: Volvulus of stomach: Per primary team, surgery. Seems to be doing well after exploratory laparotomy with reduction of volvulus, incidental splenectomy and gastropexy. Still with pain at incision site, to be expected. Working with PT. pain management per surgery. Patient is on PCA at present #2 pulmonary hypertension:  Restarted tadalafil . No significant shortness of breath, mostly likely due to  splinting. Continue incentive spirometer, PT.   #3  Hypothyroidism:  Continue synthroid orally  #4  SLE: restarted plaquenil and cellcept, prednisone  #5  DM: no oral intake currently. CBG's stable. SSI  #6  History of DVT: continue treatment as above for SLE.  INR 1.18, coumadin management per pharmacy , now on hold, initiate patient on heparin IV  #7. Acute post hemorrhagic anemia , hemoglobin decreased from 12.1 to 9.0 after surgery. Remains stable and I feel that it is okay to restart the patient on Coumadin therapy as long as this okay with surgery  All the records are reviewed and case discussed with Care Management/Social Workerr. Management plans discussed with the patient, family and they are in agreement.  CODE STATUS: FULL  TOTAL TIME TAKING CARE OF THIS PATIENT: 35 minutes.   POSSIBLE D/C IN 3-4 DAYS, DEPENDING ON CLINICAL CONDITION.   Katharina Caper M.D on 03/15/2015 at 9:29 AM  Between 7am to 6pm - Pager - [phone]  After 6pm go to [email or web address] - password [email or web address] [facility]  [place] [facility]  Office  [phone]  CC: Primary care physician; Sherlene Shams, MD

## 2015-03-15 NOTE — Progress Notes (Signed)
ANTICOAGULATION CONSULT NOTE - Initial Consult  Pharmacy Consult for heparin drip Indication: pulmonary embolus (history of)  Allergies  Allergen Reactions  . Amoxicillin Other (See Comments)    Reaction:  Stomach cramps   . Cefuroxime Itching  . Contrast Media [Iodinated Diagnostic Agents] Hives  . Cephalexin Rash  . Sulfonamide Derivatives Rash  . Tramadol Hcl Rash  . Venlafaxine Rash    Patient Measurements: Height: 5\' 2"  (157.5 cm) Weight: 144 lb 8 oz (65.545 kg) IBW/kg (Calculated) : 50.1 Heparin Dosing Weight: 63.5 kg  Vital Signs: Temp: 98.3 F (36.8 C) (06/25 2326) Temp Source: Oral (06/25 2326) BP: 155/76 mmHg (06/25 2326) Pulse Rate: 89 (06/26 0750)  Labs:  Recent Labs  03/13/15 0526 03/14/15 0457 03/15/15 0457  HGB 9.0* 9.3*  --   HCT 27.7* 28.5*  --   PLT 224 250  --   LABPROT 24.7* 20.2* 15.2*  INR 2.21 1.70 1.18  CREATININE 0.83 0.80  --     Estimated Creatinine Clearance: 51.5 mL/min (by C-G formula based on Cr of 0.8).   Medical History: Past Medical History  Diagnosis Date  . Lupus (systemic lupus erythematosus)   . Pulmonary hypertension   . Pneumonia   . Unspecified menopausal and postmenopausal disorder   . Primary pulmonary hypertension   . Acute glomerulonephritis with other specified pathological lesion in kidney in disease classified elsewhere(580.81)   . Unspecified hypothyroidism   . Shingles (herpes zoster) polyneuropathy March 2013    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  . Hiatal hernia   . DVT (deep venous thrombosis)   . Cancer     Breast     Assessment: 80 yo female on Coumadin prior to admission for hx of PE/DVT. Of note, pt with Hgb drop after surgery this admission. Orders to start heparin drip. Per Dr. Ether Griffins (telephone) start heparin drip with NO bolus; not an acute PE, just coverage for hx of PE.  6/26: INR 1.18  Goal of Therapy:  Heparin level 0.3-0.7 units/ml Monitor platelets by anticoagulation protocol:  Yes   Plan:  Will order CBC and aPTT for today. --> Hgb 9.5, Plt 288 (stable from yesterday) No bolus per MD Will order heparin drip at 900 units/hr (=9 ml/hr) Will order 8h heparin level for today at 2000 and CBC for tomorrow AM    Rayna Sexton, PharmD, BCPS Clinical Pharmacist 03/15/2015,10:13 AM

## 2015-03-16 LAB — GLUCOSE, CAPILLARY
GLUCOSE-CAPILLARY: 128 mg/dL — AB (ref 65–99)
GLUCOSE-CAPILLARY: 154 mg/dL — AB (ref 65–99)
Glucose-Capillary: 128 mg/dL — ABNORMAL HIGH (ref 65–99)

## 2015-03-16 LAB — CBC
HCT: 31.2 % — ABNORMAL LOW (ref 35.0–47.0)
HEMOGLOBIN: 9.8 g/dL — AB (ref 12.0–16.0)
MCH: 29.4 pg (ref 26.0–34.0)
MCHC: 31.5 g/dL — AB (ref 32.0–36.0)
MCV: 93.1 fL (ref 80.0–100.0)
Platelets: 322 10*3/uL (ref 150–440)
RBC: 3.35 MIL/uL — ABNORMAL LOW (ref 3.80–5.20)
RDW: 16.1 % — AB (ref 11.5–14.5)
WBC: 5.4 10*3/uL (ref 3.6–11.0)

## 2015-03-16 LAB — PROTIME-INR
INR: 1.08
Prothrombin Time: 14.2 seconds (ref 11.4–15.0)

## 2015-03-16 MED ORDER — PREDNISONE 10 MG PO TABS
10.0000 mg | ORAL_TABLET | Freq: Every day | ORAL | Status: DC
  Administered 2015-03-17 – 2015-03-20 (×4): 10 mg via ORAL
  Filled 2015-03-16 (×4): qty 1

## 2015-03-16 MED ORDER — JEVITY 1.2 CAL PO LIQD
237.0000 mL | Freq: Three times a day (TID) | ORAL | Status: DC
  Administered 2015-03-16 – 2015-03-17 (×3): 237 mL

## 2015-03-16 MED ORDER — CALCIUM CARBONATE ANTACID 500 MG PO CHEW
2.0000 | CHEWABLE_TABLET | ORAL | Status: DC | PRN
  Administered 2015-03-16 – 2015-03-17 (×2): 400 mg via ORAL
  Filled 2015-03-16 (×2): qty 2

## 2015-03-16 NOTE — Progress Notes (Signed)
MD notified of pt c/o heartburn and indigestion. PRN tums ordered, will continue to monitor.

## 2015-03-16 NOTE — Progress Notes (Signed)
Patient ID: Diana [name], female   DOB: 1935-07-30, 80 y.o.   MRN: [id number] Patient was nauseated all of yesterday, in spite of reopening the gastrostomy port to drainage. She says she is feeling some better today and the pain is less.  Exam: incision is clean. Abdomen is soft with good bowel sounds. Patient did have a bowel movement yesterday.  Will attempt capping off the gastric port on gtube again today. Will not increase tube feeding or initiate oral intake until her nausea clears.  Patient has done very limited walking and does not sit up because of a back discomfort. Encouraged to do more walking and sit up more today.

## 2015-03-16 NOTE — Progress Notes (Signed)
[facility] Physicians - [place] at [facility]   PATIENT NAME: Diana [name]    MR#:  [id number]  DATE OF BIRTH:  December 25, 1934  SUBJECTIVE:  CHIEF COMPLAINT:   Chief Complaint  Patient presents with  . Chest Pain  . Nausea  . Abdominal Pain   Much more comfortable today. Apparently had some gas expelled through rectum yesterday with little amount of stool, no complains of abdominal pain today . Feels overall better . Now on heparin IV drip  REVIEW OF SYSTEMS:   Review of Systems  Constitutional: Negative for fever.  Respiratory: Negative for shortness of breath.   Cardiovascular: Negative for chest pain and palpitations.  Gastrointestinal: Positive for abdominal pain. Negative for nausea and vomiting.  Genitourinary: Negative for dysuria.    DRUG ALLERGIES:   Allergies  Allergen Reactions  . Amoxicillin Other (See Comments)    Reaction:  Stomach cramps   . Cefuroxime Itching  . Contrast Media [Iodinated Diagnostic Agents] Hives  . Cephalexin Rash  . Sulfonamide Derivatives Rash  . Tramadol Hcl Rash  . Venlafaxine Rash    VITALS:  Blood pressure 132/65, pulse 77, temperature 98.4 F (36.9 C), temperature source Oral, resp. rate 18, height 5\' 2"  (1.575 m), weight 65.772 kg (145 lb), SpO2 97 %.  PHYSICAL EXAMINATION:  GENERAL:  80 y.o.-year-old patient lying in the bed with no acute distress.  EYES: Pupils equal, round, reactive to light and accommodation. No scleral icterus. Extraocular muscles intact.  HEENT: Head atraumatic, normocephalic. Oropharynx and nasopharynx clear. Moist mucus membranes NECK:  Supple, no jugular venous distention. No thyroid enlargement, no tenderness.  LUNGS: Normal breath sounds bilaterally, no wheezing, rales, rhonchi or crepitation. No use of accessory muscles of respiration.  CARDIOVASCULAR: S1, S2 normal. No murmurs, rubs, or gallops.  ABDOMEN: Soft, mild tenderness in upper abdomen, bandage dressed, clean and dry, G tube  in place, Bowel sounds good. No organomegaly or mass. Upper abdominal incision site is clean, no drainage and no discharge, no redness, inflammation or increased warmth  EXTREMITIES: No pedal edema, cyanosis, or clubbing.  NEUROLOGIC: Cranial nerves II through XII are intact. Muscle strength 5/5 in all extremities. Sensation intact. Gait not checked.  PSYCHIATRIC: The patient is alert and oriented x 3.  SKIN: No obvious rash, lesion, or ulcer.   LABORATORY PANEL:   CBC  Recent Labs Lab 03/16/15 0453  WBC 5.4  HGB 9.8*  HCT 31.2*  PLT 322   ------------------------------------------------------------------------------------------------------------------  Chemistries   Recent Labs Lab 03/14/15 0457  NA 143  K 4.0  CL 106  CO2 30  GLUCOSE 164*  BUN 14  CREATININE 0.80  CALCIUM 9.0   ------------------------------------------------------------------------------------------------------------------  Cardiac Enzymes  Recent Labs Lab 03/10/15 2354  TROPONINI <0.03   ------------------------------------------------------------------------------------------------------------------  RADIOLOGY:  No results found.  EKG:   Orders placed or performed during the hospital encounter of 03/10/15  . ED EKG (<11mins upon arrival to the ED)  . ED EKG (<71mins upon arrival to the ED)  . EKG 12-Lead  . EKG 12-Lead    ASSESSMENT AND PLAN:    Principal Problem:   Volvulus of stomach Active Problems:   Hypothyroidism   Systemic lupus erythematosus   Diabetes mellitus without complication   Pulmonary hypertension  1: Volvulus of stomach: Per primary team, surgery. Seems to be doing well after exploratory laparotomy with reduction of volvulus, incidental splenectomy and gastropexy.Less discomfort in upper abdomen since had gas expelled per rectum yesterday. Patient is on PCA at present.  Encouraged ambulation, patient is agreeable   #2 pulmonary hypertension: Restarted  tadalafil . No significant shortness of breath, mostly likely due to splinting. Continue incentive spirometer, PT. Restarted on heparin IV for the history of DVT    #3  Hypothyroidism:  Continue synthroid orally  #4  SLE: restarted plaquenil and cellcept,Solu-Medrol IV,  #5  DM: no oral intake currently. CBG's stable in the 150s , continue SSI  #6  History of DVT: continue treatment as above for SLE.  INR 1.18, coumadin management per pharmacy , now on hold, initiated patient on heparin IV , restart Coumadin per surgical recommendations  #7. Acute post hemorrhagic anemia , hemoglobin decreased from 12.1 to 9.0 after surgery. Remains stable and I feel that it is okay to restart the patient on Coumadin therapy as long as this okay with surgery.   All the records are reviewed and case discussed with Care Management/Social Workerr. Management plans discussed with the patient, family and they are in agreement.  CODE STATUS: FULL  TOTAL TIME TAKING CARE OF THIS PATIENT: 30nutes.   POSSIBLE D/C IN 3-4 DAYS, DEPENDING ON CLINICAL CONDITION.   Katharina Caper M.D on 03/16/2015 at 10:26 AM  Between 7am to 6pm - Pager - [phone]  After 6pm go to [email or web address] - password [email or web address] [facility]  [place] [facility]  Office  [phone]  CC: Primary care physician; Sherlene Shams, MD

## 2015-03-16 NOTE — Care Management Note (Signed)
Case Management Note  Patient Details  Name: Diana [name] MRN: [id number] Date of Birth: Jul 29, 1935  Subjective/Objective:                    Action/Plan:   Expected Discharge Date:                  Expected Discharge Plan:     In-House Referral:     Discharge planning Services     Post Acute Care Choice:    Choice offered to:     DME Arranged:    DME Agency:     HH Arranged:    [place] Agency:     Status of Service:     Medicare Important Message Given:  Yes Date Medicare IM Given:  03/13/15 Medicare IM give by:  Marshell Garfinkel Date Additional Medicare IM Given:   03/16/15 Additional Medicare Important Message give by:   L.Skyy Nilan, RN  If discussed at [employer], dates discussed:    Additional Comments:  Jahni Nazar A, RN 03/16/2015, 10:00 AM

## 2015-03-16 NOTE — Progress Notes (Signed)
Nutrition Follow-up     INTERVENTION:  Tube feeding: Jevity 1.2 continues at this time at 85ml/hr. Planning to keep at that rate per MD note.    NUTRITION DIAGNOSIS:  Inadequate oral intake related to altered GI function, acute illness as evidenced by NPO status.    GOAL:  Patient will meet greater than or equal to 90% of their needs    MONITOR:   (EN, Digestive System, Anthropometrics, Electrolyte/Renal Profile, Glucose Profile, Energy Intake)  REASON FOR ASSESSMENT:  Consult Enteral/tube feeding initiation and management  ASSESSMENT:   Current Nutrition: Jevity 1.2 continues at this time at 54ml/hr per MD order   Gastrointestinal Profile: Pt reports small BM yesterday, feeling better today, burping and passing some gas per rectum Last BM: 6/26 small   Medications: LR at 71ml/hr  Electrolyte/Renal Profile and Glucose Profile:   Recent Labs Lab 03/12/15 0551 03/13/15 0526 03/14/15 0457  NA 143 143 143  K 4.3 3.9 4.0  CL 108 106 106  CO2 28 30 30   BUN 18 15 14   CREATININE 0.88 0.83 0.80  CALCIUM 8.5* 8.7* 9.0  GLUCOSE 112* 179* 164*     Weight Trend since Admission: Filed Weights   03/14/15 0500 03/15/15 0413 03/16/15 0437  Weight: 152 lb 1.6 oz (68.992 kg) 144 lb 8 oz (65.545 kg) 145 lb (65.772 kg)      Height:  Ht Readings from Last 1 Encounters:  03/11/15 5\' 2"  (1.575 m)    Weight:  Wt Readings from Last 1 Encounters:  03/16/15 145 lb (65.772 kg)      BMI:  Body mass index is 26.51 kg/(m^2).  Estimated Nutritional Needs:  Kcal:  [id number] (BEE 1094, 1.3 AF, 1.2-1.3 IF) using IBW 50 kg  Protein:  60-70 g of protein (1.2-1.4 g/kg) using IBW 50 kg  Fluid:  1500-1750 mL (30-35 ml/kg) using IBW 50 kg   Diet Order:  Diet NPO time specified  EDUCATION NEEDS:  No education needs identified at this time   Intake/Output Summary (Last 24 hours) at 03/16/15 1403 Last data filed at 03/16/15 1031  Gross per 24 hour  Intake   901.5 ml  Output   1101 ml  Net -199.5 ml     HIGH Care Level Analiah Drum B. Zenia Resides, [place], [place] (pager)

## 2015-03-17 ENCOUNTER — Inpatient Hospital Stay: Payer: Medicare Other

## 2015-03-17 LAB — GLUCOSE, CAPILLARY
Glucose-Capillary: 101 mg/dL — ABNORMAL HIGH (ref 65–99)
Glucose-Capillary: 136 mg/dL — ABNORMAL HIGH (ref 65–99)
Glucose-Capillary: 138 mg/dL — ABNORMAL HIGH (ref 65–99)

## 2015-03-17 MED ORDER — ONDANSETRON 4 MG PO TBDP
4.0000 mg | ORAL_TABLET | ORAL | Status: DC | PRN
  Administered 2015-03-17 – 2015-03-20 (×6): 4 mg via ORAL
  Filled 2015-03-17 (×7): qty 1

## 2015-03-17 MED ORDER — WARFARIN SODIUM 5 MG PO TABS
5.0000 mg | ORAL_TABLET | Freq: Every day | ORAL | Status: DC
  Administered 2015-03-17: 5 mg via ORAL
  Filled 2015-03-17: qty 1

## 2015-03-17 MED ORDER — MORPHINE SULFATE 2 MG/ML IJ SOLN
2.0000 mg | INTRAMUSCULAR | Status: DC | PRN
  Administered 2015-03-17 – 2015-03-19 (×3): 2 mg via INTRAVENOUS
  Filled 2015-03-17 (×3): qty 1

## 2015-03-17 MED ORDER — HYDROCODONE-ACETAMINOPHEN 5-325 MG PO TABS: 1.0000 | ORAL_TABLET | ORAL | Status: DC | PRN

## 2015-03-17 MED ORDER — WARFARIN - PHYSICIAN DOSING INPATIENT: Freq: Every day | Status: DC

## 2015-03-17 MED ORDER — METOCLOPRAMIDE HCL 5 MG/5ML PO SOLN
10.0000 mg | Freq: Three times a day (TID) | ORAL | Status: DC
  Administered 2015-03-17 – 2015-03-18 (×4): 10 mg via ORAL
  Filled 2015-03-17 (×13): qty 10

## 2015-03-17 NOTE — Progress Notes (Signed)
AVSS. Still with neartburn/ nausea. No vomiting. GT clamped since yesterday. Minimal report of abdominal distension. Lungs: Clear. Cardio: RR. ABD: Minimal distension, soft, non-tender.No focal tenderness. Normal BS.  Wound: Clean. Irritation at gastrostomy site,in part as it was taped driving the traction disc into the skin. Retaped. Plan: Check KUB. May d/c TF and start po. Restart coumadin tonight.

## 2015-03-17 NOTE — Progress Notes (Signed)
Patients PCA and tube feeding discontinued.as ordered. 6 ml of morphine wasted and witnessed by Manus Rudd RN. Patient ambulated around the nurses station twice, and tolerated ambulation well. Patient also had a large bowl movement today. Patient states that she feels much better.

## 2015-03-17 NOTE — Progress Notes (Signed)
[facility] - [place] at [facility]   PATIENT NAME: Diana [name]    MR#:  [id number]  DATE OF BIRTH:  1935-04-28  SUBJECTIVE:  CHIEF COMPLAINT:   Chief Complaint  Patient presents with  . Chest Pain  . Nausea  . Abdominal Pain   Much more comfortable today. Still significant nausea and abdominal pain despite opening the gastric port of G-tube. Not moving much from the bed,  gastrointestinal nutrition is not advanced by surgeons yet. On Lovenox for history of DVT, questionable PE. Hemoglobin remains stable REVIEW OF SYSTEMS:   Review of Systems  Constitutional: Negative for fever.  Respiratory: Negative for shortness of breath.   Cardiovascular: Negative for chest pain and palpitations.  Gastrointestinal: Positive for abdominal pain. Negative for nausea and vomiting.  Genitourinary: Negative for dysuria.    DRUG ALLERGIES:   Allergies  Allergen Reactions  . Amoxicillin Other (See Comments)    Reaction:  Stomach cramps   . Cefuroxime Itching  . Contrast Media [Iodinated Diagnostic Agents] Hives  . Cephalexin Rash  . Sulfonamide Derivatives Rash  . Tramadol Hcl Rash  . Venlafaxine Rash    VITALS:  Blood pressure 134/66, pulse 81, temperature 98.8 F (37.1 C), temperature source Oral, resp. rate 17, height 5\' 2"  (1.575 m), weight 67.223 kg (148 lb 3.2 oz), SpO2 92 %.  PHYSICAL EXAMINATION:  GENERAL:  80 y.o.-year-old patient lying in the bed with no acute distress. Suffering expression on the face EYES: Pupils equal, round, reactive to light and accommodation. No scleral icterus. Extraocular muscles intact.  HEENT: Head atraumatic, normocephalic. Oropharynx and nasopharynx clear. Moist mucus membranes NECK:  Supple, no jugular venous distention. No thyroid enlargement, no tenderness.  LUNGS: Normal breath sounds bilaterally, no wheezing, rales, rhonchi or crepitation. No use of accessory muscles of respiration.  CARDIOVASCULAR: S1, S2 normal. No  murmurs, rubs, or gallops.  ABDOMEN: Soft, mild tenderness in upper abdomen, bandage dressed, clean and dry, G tube in place, Bowel sounds good. No organomegaly or mass. Upper abdominal incision site is clean, no drainage and no discharge, no redness, inflammation or increased warmth  EXTREMITIES: No pedal edema, cyanosis, or clubbing.  NEUROLOGIC: Cranial nerves II through XII are intact. Muscle strength 5/5 in all extremities. Sensation intact. Gait not checked.  PSYCHIATRIC: The patient is alert and oriented x 3.  SKIN: No obvious rash, lesion, or ulcer.   LABORATORY PANEL:   CBC  Recent Labs Lab 03/16/15 0453  WBC 5.4  HGB 9.8*  HCT 31.2*  PLT 322   ------------------------------------------------------------------------------------------------------------------  Chemistries   Recent Labs Lab 03/14/15 0457  NA 143  K 4.0  CL 106  CO2 30  GLUCOSE 164*  BUN 14  CREATININE 0.80  CALCIUM 9.0   ------------------------------------------------------------------------------------------------------------------  Cardiac Enzymes  Recent Labs Lab 03/10/15 2354  TROPONINI <0.03   ------------------------------------------------------------------------------------------------------------------  RADIOLOGY:  No results found.  EKG:   Orders placed or performed during the hospital encounter of 03/10/15  . ED EKG (<16mins upon arrival to the ED)  . ED EKG (<21mins upon arrival to the ED)  . EKG 12-Lead  . EKG 12-Lead    ASSESSMENT AND PLAN:    Principal Problem:   Volvulus of stomach Active Problems:   Hypothyroidism   Systemic lupus erythematosus   Diabetes mellitus without complication   Pulmonary hypertension  1: Volvulus of stomach: Per primary team, surgery. Status post exploratory laparotomy with reduction of volvulus, incidental splenectomy and gastropexy. Still significant nausea and discomfort in  upper abdomen by passing gas. Patient is on PCA at  present. Encouraged ambulation, patient is agreeable   #2 pulmonary hypertension: Restarted tadalafil . No significant shortness of breath, mostly likely due to splinting. Continue incentive spirometer, PT. Restarted on heparin IV for the history of DVT    #3  Hypothyroidism:  Continue synthroid orally  #4  SLE: restarted plaquenil and cellcept, prednisone  #5  DM: no oral intake currently. CBG's stable in the 150s , continue SSI  #6  History of DVT: continue treatment as above for SLE.  Check INR tomorrow morning, restart coumadin per the [facility], now on hold, continue Lovenox therapeutic doses subcutaneously  #7. Acute post hemorrhagic anemia , hemoglobin decreased from 12.1 to 9.0 after surgery. Remains stable and I feel that it is okay to restart the patient on Coumadin therapy as long as this okay with surgery.   All the records are reviewed and case discussed with Care Management/Social Workerr. Management plans discussed with the patient, family and they are in agreement.  CODE STATUS: FULL  TOTAL TIME TAKING CARE OF THIS PATIENT: 30 minutes     Dorraine Ellender M.D on 03/17/2015 at 9:00 AM  Between 7am to 6pm - Pager - [phone]  After 6pm go to [email or web address] - password [email or web address] [facility]  [facility]  Office  [phone]  CC: Primary care physician; Sherlene Shams, MD

## 2015-03-18 ENCOUNTER — Ambulatory Visit: Payer: Medicare Other | Admitting: Internal Medicine

## 2015-03-18 LAB — GLUCOSE, CAPILLARY: Glucose-Capillary: 112 mg/dL — ABNORMAL HIGH (ref 65–99)

## 2015-03-18 LAB — CREATININE, SERUM
CREATININE: 0.92 mg/dL (ref 0.44–1.00)
GFR calc Af Amer: >60 mL/min (ref >60)
GFR calc non Af Amer: 58 mL/min — ABNORMAL LOW (ref >60)

## 2015-03-18 MED ORDER — METOCLOPRAMIDE HCL 10 MG/10ML PO SOLN
10.0000 mg | Freq: Three times a day (TID) | ORAL | Status: DC
  Filled 2015-03-18 (×6): qty 10

## 2015-03-18 MED ORDER — METOCLOPRAMIDE HCL 10 MG/10ML PO SOLN
10.0000 mg | Freq: Three times a day (TID) | ORAL | Status: DC
  Administered 2015-03-18 – 2015-03-19 (×4): 10 mg via ORAL
  Filled 2015-03-18 (×14): qty 10

## 2015-03-18 MED ORDER — SIMETHICONE 80 MG PO CHEW
80.0000 mg | CHEWABLE_TABLET | Freq: Four times a day (QID) | ORAL | Status: DC
  Administered 2015-03-18 – 2015-03-20 (×5): 80 mg via ORAL
  Filled 2015-03-18 (×8): qty 1

## 2015-03-18 MED ORDER — WARFARIN SODIUM 5 MG PO TABS
5.0000 mg | ORAL_TABLET | Freq: Once | ORAL | Status: AC
  Administered 2015-03-18: 5 mg via ORAL
  Filled 2015-03-18: qty 1

## 2015-03-18 MED ORDER — WARFARIN SODIUM 5 MG PO TABS
5.0000 mg | ORAL_TABLET | Freq: Every day | ORAL | Status: DC
  Administered 2015-03-19: 5 mg via ORAL
  Filled 2015-03-18 (×2): qty 1

## 2015-03-18 MED ORDER — LEVOTHYROXINE SODIUM 150 MCG PO TABS
150.0000 ug | ORAL_TABLET | Freq: Every day | ORAL | Status: DC
  Administered 2015-03-19 – 2015-03-20 (×2): 150 ug via ORAL
  Filled 2015-03-18 (×2): qty 1

## 2015-03-18 MED ORDER — WARFARIN - PHARMACIST DOSING INPATIENT: Freq: Every day | Status: DC

## 2015-03-18 MED ORDER — WARFARIN SODIUM 7.5 MG PO TABS: 7.5000 mg | ORAL_TABLET | Freq: Every day | ORAL | Status: DC

## 2015-03-18 NOTE — Care Management (Signed)
Important Message  Patient Details  Name: Diana [name] MRN: [id number] Date of Birth: December 10, 1934   Medicare Important Message Given:  Yes-third notification given    Juliann Pulse A Allmond 03/18/2015, 11:56 AM

## 2015-03-18 NOTE — Progress Notes (Signed)
[facility] - [place] at [facility]   PATIENT NAME: Diana [name]    MR#:  [id number]  DATE OF BIRTH:  [birth year]  SUBJECTIVE:  CHIEF COMPLAINT:   Chief Complaint  Patient presents with  . Chest Pain  . Nausea  . Abdominal Pain   Much more comfortable today. Still significant nausea and diffuse intermittent abdominal pain despite stopping tube feeds. . Had a bowel movement today and feels somewhat better after bowel movement significant spasmodic character of pain and gas .Restarted on Coumadin last night by Dr. Lemar Livings REVIEW OF SYSTEMS:   Review of Systems  Constitutional: Negative for fever.  Respiratory: Negative for shortness of breath.   Cardiovascular: Negative for chest pain and palpitations.  Gastrointestinal: Positive for abdominal pain. Negative for nausea and vomiting.  Genitourinary: Negative for dysuria.    DRUG ALLERGIES:   Allergies  Allergen Reactions  . Amoxicillin Other (See Comments)    Reaction:  Stomach cramps   . Cefuroxime Itching  . Contrast Media [Iodinated Diagnostic Agents] Hives  . Cephalexin Rash  . Sulfonamide Derivatives Rash  . Tramadol Hcl Rash  . Venlafaxine Rash    VITALS:  Blood pressure 125/70, pulse 94, temperature 99.3 F (37.4 C), temperature source Oral, resp. rate 14, height 5\' 2"  (1.575 m), weight 66.543 kg (146 lb 11.2 oz), SpO2 93 %.  PHYSICAL EXAMINATION:  GENERAL:  80 y.o.-year-old patient lying in the bed with no acute distress. Suffering expression on the face EYES: Pupils equal, round, reactive to light and accommodation. No scleral icterus. Extraocular muscles intact.  HEENT: Head atraumatic, normocephalic. Oropharynx and nasopharynx clear. Moist mucus membranes NECK:  Supple, no jugular venous distention. No thyroid enlargement, no tenderness.  LUNGS: Normal breath sounds bilaterally, no wheezing, rales, rhonchi or crepitation. No use of accessory muscles of respiration.  CARDIOVASCULAR:  S1, S2 normal. No murmurs, rubs, or gallops.  ABDOMEN: Soft, diffuse moderate tenderness in abdomen.  Bowel sounds are diminished diffusely. No organomegaly or mass. Upper abdominal incision site is clean, no drainage and no discharge, no redness, inflammation or increased warmth  EXTREMITIES: No pedal edema, cyanosis, or clubbing.  NEUROLOGIC: Cranial nerves II through XII are intact. Muscle strength 5/5 in all extremities. Sensation intact. Gait not checked.  PSYCHIATRIC: The patient is alert and oriented x 3.  SKIN: No obvious rash, lesion, or ulcer.   LABORATORY PANEL:   CBC  Recent Labs Lab 03/16/15 0453  WBC 5.4  HGB 9.8*  HCT 31.2*  PLT 322   ------------------------------------------------------------------------------------------------------------------  Chemistries   Recent Labs Lab 03/14/15 0457 03/18/15 0456  NA 143  --   K 4.0  --   CL 106  --   CO2 30  --   GLUCOSE 164*  --   BUN 14  --   CREATININE 0.80 0.92  CALCIUM 9.0  --    ------------------------------------------------------------------------------------------------------------------  Cardiac Enzymes No results for input(s): TROPONINI in the last 168 hours. ------------------------------------------------------------------------------------------------------------------  RADIOLOGY:  Dg Abd 2 Views  03/17/2015   CLINICAL DATA:  Post laparotomy and 03/11/2015  EXAM: ABDOMEN - 2 VIEW  COMPARISON:  03/13/2015  FINDINGS: Skin clips from prior laparotomy.  Gastrostomy tube unchanged.  Nonobstructive bowel gas pattern.  No bowel dilatation, bowel wall thickening or free air.  Osseous demineralization with dextro convex scoliosis and prior spinal augmentation procedures at adjacent levels of the lumbar spine.  No urinary tract calcification.  No free intraperitoneal air.  Bibasilar effusions and atelectasis.  IMPRESSION: Normal bowel gas  pattern.   Electronically Signed   By: Ulyses Southward M.D.   On: 03/17/2015  14:47    EKG:   Orders placed or performed during the hospital encounter of 03/10/15  . ED EKG (<66mins upon arrival to the ED)  . ED EKG (<57mins upon arrival to the ED)  . EKG 12-Lead  . EKG 12-Lead    ASSESSMENT AND PLAN:    Principal Problem:   Volvulus of stomach Active Problems:   Hypothyroidism   Systemic lupus erythematosus   Diabetes mellitus without complication   Pulmonary hypertension  1: Volvulus of stomach: Per primary team, surgery. Status post exploratory laparotomy with reduction of volvulus, incidental splenectomy and gastropexy. Still significant nausea and discomfort in upper abdomen by passing gas. Patient is on PCA at present. Encouraged ambulation, patient is agreeable. Moving bowels, adding simethicone   #2 pulmonary hypertension: Restarted tadalafil . No significant shortness of breath. Continue incentive spirometer, PT. Being continued on Lovenox subcutaneously for the history of DVT, Coumadin was restarted last night following [employer] in the morning    #3  Hypothyroidism:  Continue synthroid orally  #4  SLE: restarted plaquenil and cellcept, prednisone  #5  DM: Started on oral intake , following oral intake. CBG's stable in the 100s to 130s, continue following  #6  History of DVT: continue treatment as above for SLE.  Follow INR daily as patient is back on coumadin per the Byr[facility], , continue Lovenox therapeutic doses subcutaneously  #7. Acute post hemorrhagic anemia , hemoglobin decreased from 12.1 to 9.0 after surgery. Remains stable All the records are reviewed and case discussed with Care Management/Social Workerr. Management plans discussed with the patient, family and they are in agreement.  CODE STATUS: FULL  TOTAL TIME TAKING CARE OF THIS PATIENT: 30 minutes     Newt Levingston M.D on 03/18/2015 at 11:20 AM  Between 7am to 6pm - Pager - [phone]  After 6pm go to [email or web address] - password [email or web address] [facility]  [place] [facility]   Office  [phone]  CC: Primary care physician; Sherlene Shams, MD

## 2015-03-18 NOTE — Progress Notes (Signed)
ANTICOAGULATION CONSULT NOTE - Initial Consult  Pharmacy Consult for Warfarin/Lovenox Indication: History of DVT  Allergies  Allergen Reactions  . Amoxicillin Other (See Comments)    Reaction:  Stomach cramps   . Cefuroxime Itching  . Contrast Media [Iodinated Diagnostic Agents] Hives  . Cephalexin Rash  . Sulfonamide Derivatives Rash  . Tramadol Hcl Rash  . Venlafaxine Rash    Patient Measurements: Height: 5\' 2"  (157.5 cm) Weight: 146 lb 11.2 oz (66.543 kg) IBW/kg (Calculated) : 50.1   Vital Signs: Temp: 99.3 F (37.4 C) (06/29 0830) Temp Source: Oral (06/29 0830) BP: 125/70 mmHg (06/29 0830) Pulse Rate: 94 (06/29 0830)  Labs:  Recent Labs  03/16/15 0453 03/18/15 0456  HGB 9.8*  --   HCT 31.2*  --   PLT 322  --   LABPROT 14.2  --   INR 1.08  --   CREATININE  --  0.92    Estimated Creatinine Clearance: 45.1 mL/min (by C-G formula based on Cr of 0.92).   Medical History: Past Medical History  Diagnosis Date  . Lupus (systemic lupus erythematosus)   . Pulmonary hypertension   . Pneumonia   . Unspecified menopausal and postmenopausal disorder   . Primary pulmonary hypertension   . Acute glomerulonephritis with other specified pathological lesion in kidney in disease classified elsewhere(580.81)   . Unspecified hypothyroidism   . Shingles (herpes zoster) polyneuropathy March 2013    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  . Hiatal hernia   . DVT (deep venous thrombosis)   . Cancer     Breast    Medications:  Scheduled:  . budesonide-formoterol  2 puff Inhalation BID  . enoxaparin (LOVENOX) injection  1 mg/kg Subcutaneous Q12H  . fluticasone  2 spray Each Nare Daily  . hydroxychloroquine  200 mg Oral Daily  . letrozole  2.5 mg Oral Daily  . [START ON 03/19/2015] levothyroxine  150 mcg Oral QAC breakfast  . metoCLOPramide  10 mg Oral TID AC & HS  . mycophenolate  1,000 mg Oral BID  . pantoprazole  40 mg Oral Daily  . predniSONE  10 mg Oral Q breakfast   . simethicone  80 mg Oral QID  . Tadalafil (PAH)  40 mg Oral Daily  . warfarin  5 mg Oral Once  . [START ON 03/19/2015] warfarin  5 mg Oral Daily  . Warfarin - Pharmacist Dosing Inpatient   Does not apply q1800    Assessment: Patient is a 80 yo female status post exploratory laparotomy with reduction of volvulus.  Per notes, patient takes warfarin chronically for history of DVT.  Home dose listed as warfarin 5 mg po daily.  Pharmacy consulted to dose warfarin.  Warfarin withheld due to surgery.  Patient is receiving treatment dose Lovenox at 65 mg subq q12h.   SCr: 0.92, est CrCl~45 mL/min, plts: 322, Hgb: 9.8  INR on 6/27 of 1.08  Goal of Therapy:  INR 2-3 Monitor SCr, platelets, hemoglobin, and INR per policy  Plan:  Continue Lovenox 65 mg subq q12h.  Will restart patient's home dose of warfarin 5 mg po daily as patient was therapeutic upon admission with this dosing.  Check INR with AM labs.   Pharmacy will continue to follow.  Laura Radilla G 03/18/2015,1:22 PM

## 2015-03-18 NOTE — Progress Notes (Signed)
AVSS. Some nausea. Large BM. Lungs: Clear. Cardio: RR. ABD: Scaphoid, soft. BS+. Wound: Clean. G tube/ feeding tube flush well. Plan: D/ C IV fluids, advance diet. UGI in AM to re-assess anatomy s/p reduction of volvulus and gastropexy.

## 2015-03-18 NOTE — Progress Notes (Signed)
Nutrition Follow-up     INTERVENTION:   (Meals and snacks:) Cater to pt prefernces. Discussed options on full liquid diet.   Nutrition Supplement therapy: will hold off on adding supplement until nausea improved  NUTRITION DIAGNOSIS:  Inadequate oral intake related to altered GI function, acute illness as evidenced by NPO status.    GOAL:  Patient will meet greater than or equal to 90% of their needs    MONITOR:   (EN, Digestive System, Anthropometrics, Electrolyte/Renal Profile, Glucose Profile, Energy Intake)  REASON FOR ASSESSMENT:  Consult Enteral/tube feeding initiation and management  ASSESSMENT:  Pt reports nausea this am, took few bites of full liquid tray.  Reports has not had a good morning.  Noted BM this am and large BM yesterday  Enteral nutrition: tube feeding stopped yesterday per MD order and started on po diet  Medications: reglan, tums, protonix, mylicon   Electrolyte/Renal Profile and Glucose Profile:   Recent Labs Lab 03/12/15 0551 03/13/15 0526 03/14/15 0457 03/18/15 0456  NA 143 143 143  --   K 4.3 3.9 4.0  --   CL 108 106 106  --   CO2 28 30 30   --   BUN 18 15 14   --   CREATININE 0.88 0.83 0.80 0.92  CALCIUM 8.5* 8.7* 9.0  --   GLUCOSE 112* 179* 164*  --    Protein Profile: No results for input(s): ALBUMIN in the last 168 hours.    Weight Trend since Admission: Filed Weights   03/16/15 0437 03/17/15 0607 03/18/15 0500  Weight: 145 lb (65.772 kg) 148 lb 3.2 oz (67.223 kg) 146 lb 11.2 oz (66.543 kg)     Height:  Ht Readings from Last 1 Encounters:  03/11/15 5\' 2"  (1.575 m)    Weight:  Wt Readings from Last 1 Encounters:  03/18/15 146 lb 11.2 oz (66.543 kg)     BMI:  Body mass index is 26.83 kg/(m^2).  Estimated Nutritional Needs:  Kcal:  [id number] (BEE 1094, 1.3 AF, 1.2-1.3 IF) using IBW 50 kg  Protein:  60-70 g of protein (1.2-1.4 g/kg) using IBW 50 kg  Fluid:  1500-1750 mL (30-35 ml/kg) using IBW 50  kg  Skin:     Diet Order:  Diet full liquid Room service appropriate?: Yes; Fluid consistency:: Thin  EDUCATION NEEDS:  No education needs identified at this time    [place]. Zenia Resides, [place], [place] (pager)

## 2015-03-19 ENCOUNTER — Inpatient Hospital Stay: Payer: Medicare Other

## 2015-03-19 LAB — CBC WITH DIFFERENTIAL/PLATELET
Basophils Absolute: 0.1 10*3/uL (ref 0–0.1)
Basophils Relative: 2 %
Eosinophils Absolute: 0.2 10*3/uL (ref 0–0.7)
Eosinophils Relative: 4 %
HCT: 28.8 % — ABNORMAL LOW (ref 35.0–47.0)
Hemoglobin: 9 g/dL — ABNORMAL LOW (ref 12.0–16.0)
Lymphocytes Relative: 15 %
Lymphs Abs: 0.8 10*3/uL — ABNORMAL LOW (ref 1.0–3.6)
MCH: 29.1 pg (ref 26.0–34.0)
MCHC: 31.2 g/dL — ABNORMAL LOW (ref 32.0–36.0)
MCV: 93.5 fL (ref 80.0–100.0)
MONOS PCT: 16 %
Monocytes Absolute: 0.9 10*3/uL (ref 0.2–0.9)
Neutro Abs: 3.4 10*3/uL (ref 1.4–6.5)
Neutrophils Relative %: 63 %
Platelets: 401 10*3/uL (ref 150–440)
RBC: 3.08 MIL/uL — AB (ref 3.80–5.20)
RDW: 16.1 % — ABNORMAL HIGH (ref 11.5–14.5)
WBC: 5.4 10*3/uL (ref 3.6–11.0)

## 2015-03-19 LAB — BASIC METABOLIC PANEL
Anion gap: 7 (ref 5–15)
BUN: 12 mg/dL (ref 6–20)
CALCIUM: 8.3 mg/dL — AB (ref 8.9–10.3)
CO2: 28 mmol/L (ref 22–32)
CREATININE: 0.99 mg/dL (ref 0.44–1.00)
Chloride: 107 mmol/L (ref 101–111)
GFR calc Af Amer: >60 mL/min (ref >60)
GFR, EST NON AFRICAN AMERICAN: 53 mL/min — AB (ref >60)
Glucose, Bld: 108 mg/dL — ABNORMAL HIGH (ref 65–99)
Potassium: 3.5 mmol/L (ref 3.5–5.1)
Sodium: 142 mmol/L (ref 135–145)

## 2015-03-19 LAB — PROTIME-INR
INR: 1.25
PROTHROMBIN TIME: 15.9 s — AB (ref 11.4–15.0)

## 2015-03-19 LAB — GLUCOSE, CAPILLARY
GLUCOSE-CAPILLARY: 102 mg/dL — AB (ref 65–99)
GLUCOSE-CAPILLARY: 120 mg/dL — AB (ref 65–99)
GLUCOSE-CAPILLARY: 150 mg/dL — AB (ref 65–99)
Glucose-Capillary: 98 mg/dL (ref 65–99)
Glucose-Capillary: 142 mg/dL — ABNORMAL HIGH (ref 65–99)
Glucose-Capillary: 128 mg/dL — ABNORMAL HIGH (ref 65–99)

## 2015-03-19 NOTE — Care Management (Addendum)
Spoke with patient for discharge planning. Patient is from home with her adult son. Stated that she drives self and that she does not use any DME in the home. Patient ambulates to and from the toilet in her room. She stated that she is weaker than usual. She stated that she uses a cane at home but does have access to a walker. Home is on one level. Patient is concerned about her dressing changes and her gastrostomy tube flushes. Discussed case with attending physician Dr Ether Griffins. MD orders home health PT and Nursing. Patient was given choice of home health providers and selected [facility] . Contacted T[facility] at [facility] to place referral. Awaiting surgical Ok to discharge. Patient placed on coumadin and awaiting therapeutic INR may need discharge on Lovenox until therapeutic.

## 2015-03-19 NOTE — Progress Notes (Signed)
Nutrition Follow-up  DOCUMENTATION CODES:     INTERVENTION:   (Meals and snacks:) Cater to pt prefences. Discussed soft/low fiber diet with pt.  Pt verbalized understanding.     NUTRITION DIAGNOSIS:  Inadequate oral intake related to altered GI function, acute illness as evidenced by NPO status.    GOAL:  Patient will meet greater than or equal to 90% of their needs    MONITOR:   (EN, Digestive System, Anthropometrics, Electrolyte/Renal Profile, Glucose Profile, Energy Intake)  REASON FOR ASSESSMENT:  Consult Enteral/tube feeding initiation and management  ASSESSMENT:  Diet Order: soft  Current Nutrition: ate some cornflakes this am but vomited after eating per pt.  Drinking coconut water, ate few bites of mashed potatoes and roll for lunch.    Gastrointestinal Profile: noted soft abdomen, hypoactive bowel sounds Last BM: 6/29   Medications: tums, reglan, protonix, mylicon  Electrolyte/Renal Profile and Glucose Profile:   Recent Labs Lab 03/13/15 0526 03/14/15 0457 03/18/15 0456 03/19/15 0609  NA 143 143  --  142  K 3.9 4.0  --  3.5  CL 106 106  --  107  CO2 30 30  --  28  BUN 15 14  --  12  CREATININE 0.83 0.80 0.92 0.99  CALCIUM 8.7* 9.0  --  8.3*  GLUCOSE 179* 164*  --  108*   Protein Profile: No results for input(s): ALBUMIN in the last 168 hours.      Weight Trend since Admission: Filed Weights   03/17/15 0607 03/18/15 0500 03/19/15 0435  Weight: 148 lb 3.2 oz (67.223 kg) 146 lb 11.2 oz (66.543 kg) 144 lb 9.6 oz (65.59 kg)      Height:  Ht Readings from Last 1 Encounters:  03/11/15 5\' 2"  (1.575 m)    Weight:  Wt Readings from Last 1 Encounters:  03/19/15 144 lb 9.6 oz (65.59 kg)      BMI:  Body mass index is 26.44 kg/(m^2).  Estimated Nutritional Needs:  Kcal:  [id number] (BEE 1094, 1.3 AF, 1.2-1.3 IF) using IBW 50 kg  Protein:  60-70 g of protein (1.2-1.4 g/kg) using IBW 50 kg  Fluid:  1500-1750 mL (30-35 ml/kg)  using IBW 50 kg   Diet Order:  DIET SOFT Room service appropriate?: Yes; Fluid consistency:: Thin  EDUCATION NEEDS:  No education needs identified at this time    [place]. Zenia Resides, [place], [place] (pager)

## 2015-03-19 NOTE — Progress Notes (Signed)
Tmax 99.9, VSS.  Vomited after breakfast (post UGI). Tolerated lunch well. Lungs: Clear. Cardio: RR. ABD: Non-distended. Soft. BS normal. + BM. Reports tenderness below umbilicus: Neg clinical exam.  Extrem: Soft.  AM labs reviewed. No thrombocytosis s/p splenectomy. HGB down slightly. Normal WBC. UGI reviewed w/ radiology. Slow passage past hiatus,then normal. No evidence of rotational abnormality.  Has walked once today. Plan:  Hold d/c until tomorrow. Discussed w/ Case Management.\

## 2015-03-19 NOTE — Progress Notes (Signed)
ANTICOAGULATION CONSULT NOTE - Follow up   Pharmacy Consult for Warfarin/Lovenox Indication: History of DVT  Allergies  Allergen Reactions  . Amoxicillin Other (See Comments)    Reaction:  Stomach cramps   . Cefuroxime Itching  . Contrast Media [Iodinated Diagnostic Agents] Hives  . Cephalexin Rash  . Sulfonamide Derivatives Rash  . Tramadol Hcl Rash  . Venlafaxine Rash    Patient Measurements: Height: 5\' 2"  (157.5 cm) Weight: 144 lb 9.6 oz (65.59 kg) IBW/kg (Calculated) : 50.1   Vital Signs: Temp: 98.5 F (36.9 C) (06/30 0729) Temp Source: Oral (06/30 0729) BP: 129/68 mmHg (06/30 0729) Pulse Rate: 88 (06/30 0729)  Labs:  Recent Labs  03/18/15 0456 03/19/15 0609  HGB  --  9.0*  HCT  --  28.8*  PLT  --  401  LABPROT  --  15.9*  INR  --  1.25  CREATININE 0.92 0.99    Estimated Creatinine Clearance: 41.6 mL/min (by C-G formula based on Cr of 0.99).   Medical History: Past Medical History  Diagnosis Date  . Lupus (systemic lupus erythematosus)   . Pulmonary hypertension   . Pneumonia   . Unspecified menopausal and postmenopausal disorder   . Primary pulmonary hypertension   . Acute glomerulonephritis with other specified pathological lesion in kidney in disease classified elsewhere(580.81)   . Unspecified hypothyroidism   . Shingles (herpes zoster) polyneuropathy March 2013    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  . Hiatal hernia   . DVT (deep venous thrombosis)   . Cancer     Breast    Medications:  Scheduled:  . budesonide-formoterol  2 puff Inhalation BID  . enoxaparin (LOVENOX) injection  1 mg/kg Subcutaneous Q12H  . fluticasone  2 spray Each Nare Daily  . hydroxychloroquine  200 mg Oral Daily  . letrozole  2.5 mg Oral Daily  . levothyroxine  150 mcg Oral QAC breakfast  . metoCLOPramide  10 mg Oral TID AC & HS  . mycophenolate  1,000 mg Oral BID  . pantoprazole  40 mg Oral Daily  . predniSONE  10 mg Oral Q breakfast  . simethicone  80 mg Oral  QID  . Tadalafil (PAH)  40 mg Oral Daily  . warfarin  5 mg Oral Daily  . Warfarin - Pharmacist Dosing Inpatient   Does not apply q1800    Assessment: Patient is a 80 yo female status post exploratory laparotomy with reduction of volvulus.  Per notes, patient takes warfarin chronically for history of DVT.  Home dose listed as warfarin 5 mg po daily.  Pharmacy consulted to dose warfarin.  Warfarin withheld due to surgery.  Patient is receiving treatment dose Lovenox at 65 mg subq q12h.   SCr: 0.92, est CrCl~45 mL/min, plts: 322, Hgb: 9.8  INR = 1.25.   Goal of Therapy:  INR 2-3 Monitor SCr, platelets, hemoglobin, and INR per policy  Plan:  Continue Lovenox 65 mg subq q12h.  Will continue patient's home dose of warfarin 5 mg po daily as patient was therapeutic upon admission with this dosing.  Check INR with AM labs.   Pharmacy will continue to follow.  Jerimey Burridge D 03/19/2015,7:40 AM

## 2015-03-19 NOTE — Progress Notes (Signed)
[facility] [place] at [facility]   PATIENT NAME: Diana [name]    MR#:  [id number]  DATE OF BIRTH:  Apr 24, 1935  SUBJECTIVE:  CHIEF COMPLAINT:   Chief Complaint  Patient presents with  . Chest Pain  . Nausea  . Abdominal Pain   Much more comfortable today. Less nausea and less abdominal pain, now on by mouth diet . Underwent upper GI today, which revealed delayed interthoracic stomach emptying into the below diaphragm stomach portion and prompt emptying to duodenum . Dr. Lemar Livings is considering discharging patient home today . The patient requests help at home, discussed with care management,  home health services will be arranged upon discharge . Pro time level remains low. Patient is on Lovenox and Coumadin bridge .  REVIEW OF SYSTEMS:   Review of Systems  Constitutional: Negative for fever.  Respiratory: Negative for shortness of breath.   Cardiovascular: Negative for chest pain and palpitations.  Gastrointestinal: Positive for abdominal pain. Negative for nausea and vomiting.  Genitourinary: Negative for dysuria.    DRUG ALLERGIES:   Allergies  Allergen Reactions  . Amoxicillin Other (See Comments)    Reaction:  Stomach cramps   . Cefuroxime Itching  . Contrast Media [Iodinated Diagnostic Agents] Hives  . Cephalexin Rash  . Sulfonamide Derivatives Rash  . Tramadol Hcl Rash  . Venlafaxine Rash    VITALS:  Blood pressure 129/68, pulse 88, temperature 98.5 F (36.9 C), temperature source Oral, resp. rate 17, height 5\' 2"  (1.575 m), weight 65.59 kg (144 lb 9.6 oz), SpO2 96 %.  PHYSICAL EXAMINATION:  GENERAL:  80 y.o.-year-old patient lying in the bed with no acute distress. Suffering expression on the face EYES: Pupils equal, round, reactive to light and accommodation. No scleral icterus. Extraocular muscles intact.  HEENT: Head atraumatic, normocephalic. Oropharynx and nasopharynx clear. Moist mucus membranes NECK:  Supple, no jugular venous  distention. No thyroid enlargement, no tenderness.  LUNGS: Normal breath sounds bilaterally, no wheezing, rales, rhonchi or crepitation. No use of accessory muscles of respiration.  CARDIOVASCULAR: S1, S2 normal. No murmurs, rubs, or gallops.  ABDOMEN: Soft, diffuse moderate tenderness in abdomen.  Bowel sounds are diminished diffusely. No organomegaly or mass. Upper abdominal incision site is clean, no drainage and no discharge, no redness, inflammation or increased warmth  EXTREMITIES: No pedal edema, cyanosis, or clubbing.  NEUROLOGIC: Cranial nerves II through XII are intact. Muscle strength 5/5 in all extremities. Sensation intact. Gait not checked.  PSYCHIATRIC: The patient is alert and oriented x 3.  SKIN: No obvious rash, lesion, or ulcer.   LABORATORY PANEL:   CBC  Recent Labs Lab 03/19/15 0609  WBC 5.4  HGB 9.0*  HCT 28.8*  PLT 401   ------------------------------------------------------------------------------------------------------------------  Chemistries   Recent Labs Lab 03/19/15 0609  NA 142  K 3.5  CL 107  CO2 28  GLUCOSE 108*  BUN 12  CREATININE 0.99  CALCIUM 8.3*   ------------------------------------------------------------------------------------------------------------------  Cardiac Enzymes No results for input(s): TROPONINI in the last 168 hours. ------------------------------------------------------------------------------------------------------------------  RADIOLOGY:  Dg Abd 2 Views  03/17/2015   CLINICAL DATA:  Post laparotomy and 03/11/2015  EXAM: ABDOMEN - 2 VIEW  COMPARISON:  03/13/2015  FINDINGS: Skin clips from prior laparotomy.  Gastrostomy tube unchanged.  Nonobstructive bowel gas pattern.  No bowel dilatation, bowel wall thickening or free air.  Osseous demineralization with dextro convex scoliosis and prior spinal augmentation procedures at adjacent levels of the lumbar spine.  No urinary tract calcification.  No  free intraperitoneal  air.  Bibasilar effusions and atelectasis.  IMPRESSION: Normal bowel gas pattern.   Electronically Signed   By: Ulyses Southward M.D.   On: 03/17/2015 14:47   Dg Kayleen Memos  W/kub  03/19/2015   CLINICAL DATA:  Large partially intra thoracic stomach with recent adhesion release and pull down procedure ; persistent nausea.  EXAM: UPPER GI SERIES WITHOUT KUB  TECHNIQUE: Routine upper GI series was performed with thin barium. The patient was nauseated at the beginning of the study and the volume of barium that could be administered was limited.  FLUOROSCOPY TIME:  Fluoroscopy Time (in minutes and seconds): 1 minutes, 6 seconds  Number of Acquired Images:  20 including video clips  COMPARISON:  CT scan dated March 11, 2015  FINDINGS: The scout image revealed no evidence of gastric or bowel obstruction. The Moss gastrostomy tube is visible. The patient ingested barium which passed to the distal esophagus and into the intra thoracic portion of the stomach without difficulty. However, in the semi supine position the barium did not progress more inferiorly. When the patient was brought to a semi upright position there was then drainage through the esophageal hiatus into the abdomen intra-abdominal portion of the stomach. Significant distention of the intra-abdominal portion of the stomach was not obtained. When the patient was turned to the right side decubitus position emptying of the stomach into the proximal duodenum occurred.  IMPRESSION: 1. A portion of the stomach remains above the hemidiaphragm. Emptying of the intra thoracic portion of the stomach is delayed until the patient assumes the semi upright position. At that point drainage of the intra thoracic portion into the intra-abdominal portion occurs through a mildly narrowed channel through the aortic hiatus. 2. Drainage of the intra-abdominal portion of the stomach into the duodenum occurs promptly when the patient assumes the right-side-down decubitus position. 3.  Throughout the procedure the patient experienced sensation of nausea when in the semi recumbent position which was partially relieved by assuming the semi erect position.   Electronically Signed   By: David  [place] M.D.   On: 03/19/2015 09:05    EKG:   Orders placed or performed during the hospital encounter of 03/10/15  . ED EKG (<13mins upon arrival to the ED)  . ED EKG (<52mins upon arrival to the ED)  . EKG 12-Lead  . EKG 12-Lead    ASSESSMENT AND PLAN:    Principal Problem:   Volvulus of stomach Active Problems:   Hypothyroidism   Systemic lupus erythematosus   Diabetes mellitus without complication   Pulmonary hypertension  1: Volvulus of stomach:  Status post exploratory laparotomy with reduction of volvulus, incidental splenectomy and gastropexy. Still some nausea  and discomfort in upper abdomen, likely caused by delayed intrathoracic stomach portion emptying into the low thoracic stomach portion as charged by upper GI series, relieved by upright position.  Moving bowels,more comfortable with simeticone   #2 pulmonary hypertension: Restarted tadalafil . No significant shortness of breath. Continue incentive spirometer, PT. Being continued on Lovenox subcutaneously for the history of DVT, Coumadin was restarted recently, however, patient's  ProTime is not therapeutic, patient will need to have Lovenox Coumadin bridge as well as home health RN to follow her pro time levels   #3  Hypothyroidism:  Continue synthroid orally  #4  SLE: restarted plaquenil and cellcept, prednisone  #5  DM: Started on oral intake , following oral intake. CBG's stable in the 100s to 130s, continue following  #6  History  of DVT: continue treatment as above for SLE.  Follow INR , continue Lovenox  /coumadin  bridge , home health services will be arranged to follow patient's pro time levels and discontinue Lovenox when patient's Coumadin is therapeutic  #7. Acute post hemorrhagic anemia , hemoglobin  decreased from 12.1 to 9.0 after surgery. Remains stable, no change with anticoagulation   All the records are reviewed and case discussed with Care Management/Social Workerr. Management plans discussed with the patient, family and they are in agreement.  CODE STATUS: FULL  TOTAL TIME TAKING CARE OF THIS PATIENT: 30 minutes   Home health services order is placed on the chart, discussed with care management  Malayasia Mirkin M.D on 03/19/2015 at 11:27 AM  Between 7am to 6pm - Pager - [phone]  After 6pm go to [email or web address] - password [email or web address] [facility]  [facility]  Office  [phone]  CC: Primary care physician; Sherlene Shams, MD

## 2015-03-20 ENCOUNTER — Other Ambulatory Visit: Payer: Self-pay | Admitting: General Surgery

## 2015-03-20 LAB — PROTIME-INR
INR: 1.59
Prothrombin Time: 19.1 seconds — ABNORMAL HIGH (ref 11.4–15.0)

## 2015-03-20 LAB — CBC WITH DIFFERENTIAL/PLATELET
Basophils Absolute: 0.1 10*3/uL (ref 0–0.1)
Basophils Relative: 2 %
EOS ABS: 0.1 10*3/uL (ref 0–0.7)
Eosinophils Relative: 3 %
HCT: 28.4 % — ABNORMAL LOW (ref 35.0–47.0)
Hemoglobin: 9 g/dL — ABNORMAL LOW (ref 12.0–16.0)
Lymphocytes Relative: 23 %
Lymphs Abs: 1.1 10*3/uL (ref 1.0–3.6)
MCH: 29.4 pg (ref 26.0–34.0)
MCHC: 31.7 g/dL — ABNORMAL LOW (ref 32.0–36.0)
MCV: 92.8 fL (ref 80.0–100.0)
Monocytes Absolute: 0.9 10*3/uL (ref 0.2–0.9)
Monocytes Relative: 18 %
NEUTROS PCT: 54 %
Neutro Abs: 2.6 10*3/uL (ref 1.4–6.5)
Platelets: 435 10*3/uL (ref 150–440)
RBC: 3.07 MIL/uL — ABNORMAL LOW (ref 3.80–5.20)
RDW: 16.1 % — ABNORMAL HIGH (ref 11.5–14.5)
WBC: 4.8 10*3/uL (ref 3.6–11.0)

## 2015-03-20 LAB — GLUCOSE, CAPILLARY
Glucose-Capillary: 109 mg/dL — ABNORMAL HIGH (ref 65–99)
Glucose-Capillary: 115 mg/dL — ABNORMAL HIGH (ref 65–99)

## 2015-03-20 MED ORDER — ONDANSETRON 4 MG PO TBDP: 4.0000 mg | ORAL_TABLET | ORAL | Status: DC | PRN

## 2015-03-20 MED ORDER — ENOXAPARIN SODIUM 60 MG/0.6ML ~~LOC~~ SOLN
60.0000 mg | Freq: Two times a day (BID) | SUBCUTANEOUS | Status: DC
  Filled 2015-03-20 (×2): qty 0.6

## 2015-03-20 MED ORDER — LORAZEPAM 2 MG/ML IJ SOLN: 0.5000 mg | Freq: Four times a day (QID) | INTRAMUSCULAR | Status: DC | PRN

## 2015-03-20 MED ORDER — METOCLOPRAMIDE HCL 5 MG PO TABS: 5.0000 mg | ORAL_TABLET | Freq: Three times a day (TID) | ORAL | Status: DC

## 2015-03-20 MED ORDER — HYDROCODONE-ACETAMINOPHEN 5-325 MG PO TABS: 1.0000 | ORAL_TABLET | ORAL | Status: DC | PRN

## 2015-03-20 MED ORDER — ONDANSETRON HCL 4 MG/2ML IJ SOLN
4.0000 mg | Freq: Four times a day (QID) | INTRAMUSCULAR | Status: DC | PRN
  Administered 2015-03-20: 4 mg via INTRAVENOUS
  Filled 2015-03-20: qty 2

## 2015-03-20 MED ORDER — ENOXAPARIN SODIUM 60 MG/0.6ML ~~LOC~~ SOLN: 60.0000 mg | Freq: Two times a day (BID) | SUBCUTANEOUS | Status: DC

## 2015-03-20 NOTE — Progress Notes (Signed)
Tmax 99. VSS. Emesis after breakfast again today. She relates to large BM just prior. Did better with lunch. Did well with PM meal. Tolerated liquids well. Ambulated well in hall today. Lungs: Clear. Cardio: RR. ABD: Soft, non-tender, non-distended. Wound: Clean. Extrem: Soft. Discussed with Dr. Neill Loft: Will send home on Lovenox 60 mg BID for two days at which time her INR should be fine. ( INR 1.6 today). The need to drink 1 liter / day reviewed.  Supplements are fine: Ensure, Boost, Slim Fast , etc. PT ordered for home to assist w/ strenthening.

## 2015-03-20 NOTE — Care Management (Signed)
Important Message  Patient Details  Name: NAJAYAH KAMINER MRN: [id number] Date of Birth: [birth year]   Medicare Important Message Given:  Yes-fourth notification given    Juliann Pulse A Allmond 03/20/2015, 11:51 AM

## 2015-03-20 NOTE — Discharge Instructions (Signed)
Drink at least 1 quart of liquids daily. Diet as tolerated.

## 2015-03-20 NOTE — Care Management (Addendum)
Contacted Tiffany with [facility] to notify of patient discharge today. Spoke with Dr Doreatha Lew concerning discharge. Patient ambulated one lap around nursing station with Dr Bary Castilla.

## 2015-03-20 NOTE — Progress Notes (Signed)
ANTICOAGULATION CONSULT NOTE - Follow up   Pharmacy Consult for Warfarin/Lovenox Indication: History of DVT  Allergies  Allergen Reactions  . Amoxicillin Other (See Comments)    Reaction:  Stomach cramps   . Cefuroxime Itching  . Contrast Media [Iodinated Diagnostic Agents] Hives  . Cephalexin Rash  . Sulfonamide Derivatives Rash  . Tramadol Hcl Rash  . Venlafaxine Rash    Patient Measurements: Height: 5\' 2"  (157.5 cm) Weight: 141 lb 1.6 oz (64.003 kg) IBW/kg (Calculated) : 50.1   Vital Signs: Temp: 99 F (37.2 C) (06/30 2331) Temp Source: Oral (06/30 2331) BP: 122/62 mmHg (06/30 2331) Pulse Rate: 98 (06/30 2331)  Labs:  Recent Labs  03/18/15 0456 03/19/15 0609 03/20/15 0550  HGB  --  9.0* 9.0*  HCT  --  28.8* 28.4*  PLT  --  401 435  LABPROT  --  15.9* 19.1*  INR  --  1.25 1.59  CREATININE 0.92 0.99  --     Estimated Creatinine Clearance: 41.2 mL/min (by C-G formula based on Cr of 0.99).   Medical History: Past Medical History  Diagnosis Date  . Lupus (systemic lupus erythematosus)   . Pulmonary hypertension   . Pneumonia   . Unspecified menopausal and postmenopausal disorder   . Primary pulmonary hypertension   . Acute glomerulonephritis with other specified pathological lesion in kidney in disease classified elsewhere(580.81)   . Unspecified hypothyroidism   . Shingles (herpes zoster) polyneuropathy March 2013    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  . Hiatal hernia   . DVT (deep venous thrombosis)   . Cancer     Breast    Medications:  Scheduled:  . budesonide-formoterol  2 puff Inhalation BID  . enoxaparin (LOVENOX) injection  1 mg/kg Subcutaneous Q12H  . fluticasone  2 spray Each Nare Daily  . hydroxychloroquine  200 mg Oral Daily  . letrozole  2.5 mg Oral Daily  . levothyroxine  150 mcg Oral QAC breakfast  . metoCLOPramide  10 mg Oral TID AC & HS  . mycophenolate  1,000 mg Oral BID  . pantoprazole  40 mg Oral Daily  . predniSONE  10 mg  Oral Q breakfast  . simethicone  80 mg Oral QID  . Tadalafil (PAH)  40 mg Oral Daily  . warfarin  5 mg Oral Daily  . Warfarin - Pharmacist Dosing Inpatient   Does not apply q1800    Assessment: Patient is a 80 yo female status post exploratory laparotomy with reduction of volvulus.  Per notes, patient takes warfarin chronically for history of DVT.  Home dose listed as warfarin 5 mg po daily.  Pharmacy consulted to dose warfarin.  Warfarin withheld due to surgery.  Patient is receiving treatment dose Lovenox at 65 mg subq q12h.   SCr: 0.92, est CrCl~45 mL/min, plts: 322, Hgb: 9.8  INR= 1.59.   Goal of Therapy:  INR 2-3 Monitor SCr, platelets, hemoglobin, and INR per policy  Plan:  Continue Lovenox 65 mg subq q12h. INR trending up at an appropriate rate. Will continue warfarin 5 mg PO daily.    Pharmacy will continue to follow.  Larene Beach, PharmD 03/20/2015,7:42 AM

## 2015-03-20 NOTE — Progress Notes (Signed)
[facility] - [place] at [facility]   PATIENT NAME: Diana [name]    MR#:  [id number]  DATE OF BIRTH:  October 19, 1934  SUBJECTIVE:   No abdominal pain today.  Has some nausea/vomiting this a.m.  Had a BM this a.m.    REVIEW OF SYSTEMS:   Review of Systems  Constitutional: Negative for fever and chills.  HENT: Negative for congestion and tinnitus.   Eyes: Negative for blurred vision and double vision.  Respiratory: Negative for cough, shortness of breath and wheezing.   Cardiovascular: Negative for chest pain, palpitations, orthopnea and PND.  Gastrointestinal: Positive for nausea and vomiting. Negative for abdominal pain and diarrhea.  Genitourinary: Negative for dysuria and hematuria.  Neurological: Negative for dizziness, sensory change and focal weakness.  All other systems reviewed and are negative.   DRUG ALLERGIES:   Allergies  Allergen Reactions  . Amoxicillin Other (See Comments)    Reaction:  Stomach cramps   . Cefuroxime Itching  . Contrast Media [Iodinated Diagnostic Agents] Hives  . Cephalexin Rash  . Sulfonamide Derivatives Rash  . Tramadol Hcl Rash  . Venlafaxine Rash    VITALS:  Blood pressure 120/63, pulse 89, temperature 98.6 F (37 C), temperature source Oral, resp. rate 17, height 5\' 2"  (1.575 m), weight 64.003 kg (141 lb 1.6 oz), SpO2 92 %.  PHYSICAL EXAMINATION:  GENERAL:  80 y.o.-year-old patient ambulating in room in no acute distress. EYES: Pupils equal, round, reactive to light and accommodation. No scleral icterus. Extraocular muscles intact.  HEENT: Head atraumatic, normocephalic. Oropharynx and nasopharynx clear. Moist mucus membranes NECK:  Supple, no jugular venous distention. No thyroid enlargement, no tenderness.  LUNGS: Normal breath sounds bilaterally, no wheezing, rales, rhonchi or crepitation. No use of accessory muscles of respiration.  CARDIOVASCULAR: S1, S2 normal. No murmurs, rubs, or gallops.  ABDOMEN:  Soft, mild tenderness in abdomen, no rebound, rigidity. Bowel sounds hypoactive. No organomegaly or mass. Upper abdominal incision site is clean, no drainage and no discharge, no redness, inflammation or increased warmth  EXTREMITIES: No pedal edema, cyanosis, or clubbing.  NEUROLOGIC: Cranial nerves II through XII are intact. No focal motor or sensory defecits b/l.  PSYCHIATRIC: The patient is alert and oriented x 3. Good affect.  SKIN: No obvious rash, lesion, or ulcer.   LABORATORY PANEL:   CBC  Recent Labs Lab 03/20/15 0550  WBC 4.8  HGB 9.0*  HCT 28.4*  PLT 435   ------------------------------------------------------------------------------------------------------------------  Chemistries   Recent Labs Lab 03/19/15 0609  NA 142  K 3.5  CL 107  CO2 28  GLUCOSE 108*  BUN 12  CREATININE 0.99  CALCIUM 8.3*   ------------------------------------------------------------------------------------------------------------------  Cardiac Enzymes No results for input(s): TROPONINI in the last 168 hours. ------------------------------------------------------------------------------------------------------------------  RADIOLOGY:  Dg Ugi  W/kub  03/19/2015   CLINICAL DATA:  Large partially intra thoracic stomach with recent adhesion release and pull down procedure ; persistent nausea.  EXAM: UPPER GI SERIES WITHOUT KUB  TECHNIQUE: Routine upper GI series was performed with thin barium. The patient was nauseated at the beginning of the study and the volume of barium that could be administered was limited.  FLUOROSCOPY TIME:  Fluoroscopy Time (in minutes and seconds): 1 minutes, 6 seconds  Number of Acquired Images:  20 including video clips  COMPARISON:  CT scan dated March 11, 2015  FINDINGS: The scout image revealed no evidence of gastric or bowel obstruction. The Moss gastrostomy tube is visible. The patient ingested barium which passed to  the distal esophagus and into the intra  thoracic portion of the stomach without difficulty. However, in the semi supine position the barium did not progress more inferiorly. When the patient was brought to a semi upright position there was then drainage through the esophageal hiatus into the abdomen intra-abdominal portion of the stomach. Significant distention of the intra-abdominal portion of the stomach was not obtained. When the patient was turned to the right side decubitus position emptying of the stomach into the proximal duodenum occurred.  IMPRESSION: 1. A portion of the stomach remains above the hemidiaphragm. Emptying of the intra thoracic portion of the stomach is delayed until the patient assumes the semi upright position. At that point drainage of the intra thoracic portion into the intra-abdominal portion occurs through a mildly narrowed channel through the aortic hiatus. 2. Drainage of the intra-abdominal portion of the stomach into the duodenum occurs promptly when the patient assumes the right-side-down decubitus position. 3. Throughout the procedure the patient experienced sensation of nausea when in the semi recumbent position which was partially relieved by assuming the semi erect position.   Electronically Signed   By: David  [place]iland M.D.   On: 03/19/2015 09:05    ASSESSMENT AND PLAN:    Principal Problem:   Volvulus of stomach Active Problems:   Hypothyroidism   Systemic lupus erythematosus   Diabetes mellitus without complication   Pulmonary hypertension  1: Volvulus of stomach:  Status post exploratory laparotomy with reduction of volvulus, incidental splenectomy and gastropexy. Still having some nausea/vomiting and discomfort in upper abdomen but improved.  - likely due to delayed gastric emptying which has improved now.  Cont. Supportive care w/ PRN anti-emetics and monitor.  - pt had a BM today.  Cont. Care as per surgery.   #2 pulmonary hypertension: cont. tadalafil . No significant shortness of breath.  - no  acute issue   #3  Hypothyroidism:  Continue synthroid  #4  SLE: cont. Plaquenil, cellcept, prednisone   #5  DM: BS stable.  #6  History of DVT: cont. Coumadin.  Cont. Lovenox, coumadin bridge until INR therapeutic.  LIkely needs INR followed by home health nurse upon discharge  #7. Acute post hemorrhagic anemia , hemoglobin decreased from 12.1 to 9.0 after surgery.  - stable and no acute bleeding and will monitor.   All the records are reviewed and case discussed with Care Management/Social Workerr. Management plans discussed with the patient, family and they are in agreement.  CODE STATUS: FULL  TOTAL TIME TAKING CARE OF THIS PATIENT: 25 minutes   Hilda Lias J M.D on 03/20/2015 at 9:45 AM  Between 7am to 6pm - Pager - [phone]  After 6pm go to [email or web address] - password [email or web address] [facility]  [place] [facility]  Office  [phone]  CC: Primary care physician; Sherlene Shams, MD

## 2015-03-20 NOTE — Progress Notes (Signed)
Pt vomitted 50cc after breakfast. PO zofran given prior to episode. MD ordered IV zofran

## 2015-03-21 DIAGNOSIS — E119 Type 2 diabetes mellitus without complications: Secondary | ICD-10-CM | POA: Diagnosis not present

## 2015-03-21 DIAGNOSIS — D62 Acute posthemorrhagic anemia: Secondary | ICD-10-CM | POA: Diagnosis not present

## 2015-03-21 DIAGNOSIS — Z48815 Encounter for surgical aftercare following surgery on the digestive system: Secondary | ICD-10-CM | POA: Diagnosis not present

## 2015-03-21 DIAGNOSIS — Z431 Encounter for attention to gastrostomy: Secondary | ICD-10-CM | POA: Diagnosis not present

## 2015-03-21 DIAGNOSIS — E039 Hypothyroidism, unspecified: Secondary | ICD-10-CM | POA: Diagnosis not present

## 2015-03-21 DIAGNOSIS — M329 Systemic lupus erythematosus, unspecified: Secondary | ICD-10-CM | POA: Diagnosis not present

## 2015-03-21 DIAGNOSIS — Z86718 Personal history of other venous thrombosis and embolism: Secondary | ICD-10-CM | POA: Diagnosis not present

## 2015-03-21 DIAGNOSIS — I272 Other secondary pulmonary hypertension: Secondary | ICD-10-CM | POA: Diagnosis not present

## 2015-03-22 ENCOUNTER — Telehealth: Payer: Self-pay | Admitting: General Surgery

## 2015-03-22 NOTE — Telephone Encounter (Signed)
Niece, Shirlean Mylar [employer]) called with some concerns. Home health nurse noted Temp 100.8 yesterday, patient unaware. Today, Temp 99.8. ?chills today.  Nausea persists. Taking Ensure 2 cans/ day plus popcicles.  Likely under 1 L day requested intake. Using Zofran. Loose stools: 3-5/ day, 1 cup or less. OK for use of Imodium. No abdominal pain to speak of. Wound: Minimal irritation around the GT site reported. To work on po volumes. Will call if new issues arise. F/U 7/5 at 11 AM otherwise.

## 2015-03-23 DIAGNOSIS — D62 Acute posthemorrhagic anemia: Secondary | ICD-10-CM | POA: Diagnosis not present

## 2015-03-23 DIAGNOSIS — Z431 Encounter for attention to gastrostomy: Secondary | ICD-10-CM | POA: Diagnosis not present

## 2015-03-23 DIAGNOSIS — Z48815 Encounter for surgical aftercare following surgery on the digestive system: Secondary | ICD-10-CM | POA: Diagnosis not present

## 2015-03-23 DIAGNOSIS — I272 Other secondary pulmonary hypertension: Secondary | ICD-10-CM | POA: Diagnosis not present

## 2015-03-23 DIAGNOSIS — M329 Systemic lupus erythematosus, unspecified: Secondary | ICD-10-CM | POA: Diagnosis not present

## 2015-03-23 DIAGNOSIS — E119 Type 2 diabetes mellitus without complications: Secondary | ICD-10-CM | POA: Diagnosis not present

## 2015-03-24 ENCOUNTER — Ambulatory Visit (INDEPENDENT_AMBULATORY_CARE_PROVIDER_SITE_OTHER): Payer: Medicare Other | Admitting: General Surgery

## 2015-03-24 ENCOUNTER — Encounter: Payer: Self-pay | Admitting: General Surgery

## 2015-03-24 VITALS — BP 122/68 | HR 72 | Temp 99.7°F | Resp 14 | Ht 64.0 in | Wt 138.0 lb

## 2015-03-24 DIAGNOSIS — Z9889 Other specified postprocedural states: Secondary | ICD-10-CM | POA: Insufficient documentation

## 2015-03-24 DIAGNOSIS — K3189 Other diseases of stomach and duodenum: Secondary | ICD-10-CM

## 2015-03-24 DIAGNOSIS — I272 Other secondary pulmonary hypertension: Secondary | ICD-10-CM | POA: Diagnosis not present

## 2015-03-24 DIAGNOSIS — R5082 Postprocedural fever: Secondary | ICD-10-CM

## 2015-03-24 DIAGNOSIS — M329 Systemic lupus erythematosus, unspecified: Secondary | ICD-10-CM | POA: Diagnosis not present

## 2015-03-24 DIAGNOSIS — Z48815 Encounter for surgical aftercare following surgery on the digestive system: Secondary | ICD-10-CM | POA: Diagnosis not present

## 2015-03-24 DIAGNOSIS — Z431 Encounter for attention to gastrostomy: Secondary | ICD-10-CM | POA: Diagnosis not present

## 2015-03-24 DIAGNOSIS — D62 Acute posthemorrhagic anemia: Secondary | ICD-10-CM | POA: Diagnosis not present

## 2015-03-24 DIAGNOSIS — R110 Nausea: Secondary | ICD-10-CM | POA: Insufficient documentation

## 2015-03-24 DIAGNOSIS — R112 Nausea with vomiting, unspecified: Secondary | ICD-10-CM

## 2015-03-24 DIAGNOSIS — E119 Type 2 diabetes mellitus without complications: Secondary | ICD-10-CM | POA: Diagnosis not present

## 2015-03-24 MED ORDER — PREDNISONE 50 MG PO TABS: ORAL_TABLET | ORAL | Status: DC

## 2015-03-24 MED ORDER — DIPHENHYDRAMINE HCL 50 MG PO TABS: 50.0000 mg | ORAL_TABLET | Freq: Once | ORAL | Status: DC

## 2015-03-24 NOTE — Progress Notes (Signed)
Patient ID: Diana [name], female   DOB: March 08, 1935, 80 y.o.   MRN: [id number]  Chief Complaint  Patient presents with  . Follow-up    HPI Diana [name] is a 80 y.o. female here today following up from exploratory laparotomy done on 03/11/15. She states she is not feeling well. Patient is having so nausea and vomiting this morning.    HPI  Past Medical History  Diagnosis Date  . Lupus (systemic lupus erythematosus)   . Pulmonary hypertension   . Pneumonia   . Unspecified menopausal and postmenopausal disorder   . Primary pulmonary hypertension   . Acute glomerulonephritis with other specified pathological lesion in kidney in disease classified elsewhere(580.81)   . Unspecified hypothyroidism   . Shingles (herpes zoster) polyneuropathy March 2013    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  . Hiatal hernia   . DVT (deep venous thrombosis)   . Cancer     Breast    Past Surgical History  Procedure Laterality Date  . Kyphoplasty N/A 02/10/2015    Procedure: [id number];  Surgeon: Kennedy Bucker, MD;  Location: [facility] ORS;  Service: Orthopedics;  Laterality: N/A;  . Laparotomy N/A 03/11/2015    Procedure: REDUCTION OF GASTRIC VOLVULUS, SPLEENECTOMY, GASTRIC TUBE PLACEMENT;  Surgeon: Earline Mayotte, MD;  Location: [facility] ORS;  Service: General;  Laterality: N/A;    No family history on file.  Social History History  Substance Use Topics  . Smoking status: Former Smoker    Quit date: 05/25/1991  . Smokeless tobacco: Never Used  . Alcohol Use: Yes     Comment: rare    Allergies  Allergen Reactions  . Amoxicillin Other (See Comments)    Reaction:  Stomach cramps   . Cefuroxime Itching  . Contrast Media [Iodinated Diagnostic Agents] Hives  . Cephalexin Rash  . Sulfonamide Derivatives Rash  . Tramadol Hcl Rash  . Venlafaxine Rash    Current Outpatient Prescriptions  Medication Sig Dispense Refill  . ALPRAZolam (XANAX) 0.5 MG tablet Take 0.5 mg by mouth at bedtime as needed  for anxiety.    . budesonide-formoterol (SYMBICORT) 160-4.5 MCG/ACT inhaler Inhale 2 puffs into the lungs 2 (two) times daily.    . Calcium Carbonate-Vitamin D (CALCIUM + D PO) Take 1 tablet by mouth daily.    . cyanocobalamin (,VITAMIN B-12,) 1000 MCG/ML injection Inject 1 mL (1,000 mcg total) into the muscle every 30 (thirty) days. 10 mL 1  . enoxaparin (LOVENOX) 60 MG/0.6ML injection Inject 0.6 mLs (60 mg total) into the skin every 12 (twelve) hours. 4 Syringe 0  . fluticasone (FLONASE) 50 MCG/ACT nasal spray Place 2 sprays into both nostrils daily as needed for rhinitis.    . furosemide (LASIX) 20 MG tablet Take 20 mg by mouth daily.    Marland Kitchen HYDROcodone-acetaminophen (NORCO/VICODIN) 5-325 MG per tablet Take 1 tablet by mouth every 6 (six) hours as needed for moderate pain. 90 tablet 0  . HYDROcodone-acetaminophen (NORCO/VICODIN) 5-325 MG per tablet Take 1-2 tablets by mouth every 4 (four) hours as needed for moderate pain. 30 tablet 0  . hydroxychloroquine (PLAQUENIL) 200 MG tablet Take 200 mg by mouth daily.    Marland Kitchen letrozole (FEMARA) 2.5 MG tablet Take 2.5 mg by mouth daily.    Marland Kitchen LORazepam (ATIVAN) 2 MG/ML injection Inject 0.25 mLs (0.5 mg total) into the vein every 6 (six) hours as needed for anxiety. 1 mL 0  . metoCLOPramide (REGLAN) 5 MG tablet Take 1 tablet (5 mg total) by mouth 4 (four)  times daily -  before meals and at bedtime. 40 tablet 0  . metoprolol succinate (TOPROL-XL) 25 MG 24 hr tablet Take 25 mg by mouth daily.    . mycophenolate (CELLCEPT) 500 MG tablet Take 1,000 mg by mouth 2 (two) times daily.     Marland Kitchen omeprazole (PRILOSEC) 40 MG capsule Take 40 mg by mouth daily.    . ondansetron (ZOFRAN-ODT) 4 MG disintegrating tablet Take 1 tablet (4 mg total) by mouth every 4 (four) hours as needed for nausea or vomiting. 20 tablet 0  . predniSONE (DELTASONE) 5 MG tablet Take 5 mg by mouth daily.    . ranitidine (ZANTAC) 150 MG tablet Take 1 tablet (150 mg total) by mouth at bedtime. 90 tablet  2  . SYNTHROID 150 MCG tablet Take 1 tablet (150 mcg total) by mouth daily. 30 tablet 11  . Tadalafil, PAH, (ADCIRCA) 20 MG TABS Take 40 mg by mouth daily.    Marland Kitchen warfarin (COUMADIN) 5 MG tablet Take 1 tablet (5 mg total) by mouth daily. 30 tablet 3  . diphenhydrAMINE (BENADRYL) 50 MG tablet Take 1 tablet (50 mg total) by mouth once. Take 50 mg Benadryl 1 hour prior to CT scan 1 tablet 0  . predniSONE (DELTASONE) 50 MG tablet Take 1 tablet 13 hours, 7 hours, and 1 hour prior to CT scan 3 tablet 0   No current facility-administered medications for this visit.    Review of Systems Review of Systems  Constitutional: Positive for fever and chills.  Respiratory: Negative.   Cardiovascular: Negative.   Gastrointestinal: Positive for nausea, vomiting, abdominal pain and diarrhea. Negative for abdominal distention.    Blood pressure 122/68, pulse 72, temperature 99.7 F (37.6 C), resp. rate 14, height 5\' 4"  (1.626 m), weight 138 lb (62.596 kg).  Physical Exam Physical Exam  Constitutional: She is oriented to person, place, and time. She appears well-developed and well-nourished.  HENT:  Mouth/Throat: Oropharynx is clear and moist.  Eyes: Conjunctivae are normal. No scleral icterus.  Neck: Neck supple.  Cardiovascular: Normal rate, regular rhythm and normal heart sounds.   Pulmonary/Chest: Effort normal and breath sounds normal.  Abdominal: Soft. Normal appearance and bowel sounds are normal. There is no tenderness.    Neurological: She is alert and oriented to person, place, and time.  Skin: Skin is warm and dry.  Drain was removed.   Data Reviewed Barium swallow completed prior to discharge showed slow passage of contrast from the small residual portion of the stomach above the diaphragm to the majority of the stomach below. No other impediment to emptying.  Plain films of the abdomen had shown no specific abdominal pattern.  Assessment    Persistent postprandial nausea,  intermittent vomiting.  Mild volume dehydration.  History of fever, no clinically evident source.  Urinalysis/culture ordered during hospitalization was not collected. ( patient denies any urinary symptoms, past history asymptomatic UTI) .    Plan    The patient's clinical exam is very unremarkable.  Plan was to remove the sutures at the gastrostomy tube site. It was found that the tube was gently extruding and that the balloon had deflated spontaneously. The catheter was removed without difficulty. Prior to removal, irrigation through the feeding channel had produced some mild discomfort in the abdomen, the patient experienced no discomfort during flushing of the gastrostomy portion. There was minimal discharge from the gastrostomy site after removal and a dry dressing was applied.    Arrangements were made for a CBC and  comprehensive metabolic panel today. The patient will continue to make use of nutritional supplements such as ensure and maintain a diet as tolerated.  Attempts were made to have a CT of the chest and abdomen completed, based on the patient's history of fever, history of SLE with pulmonary involvement and her recent reduction of a large hiatal hernia with gastric volvulus. No coding diagnosis could be made to satisfy requirements for a chest CT, my hope is that the abdominal CT will show enough of the lower chest to clear any areas of concern related to her recent surgery.  Patient is scheduled for a CT of the Abdomen with contrast at [facility] on 03/26/15 at 10:30 am. She will arrive by 10:00 am. She will pick up a prep kit. The patient is to have no solid foods 4 hours prior but may have clear liquids. She will pre medicate for this test with Prednisone 50 mg 13 hours, 7 hours, and 1 hour prior to the procedure. She will also take Benadryl 50 mg 1 hour prior to the procedure. Patient is aware of date, time, and instructions.   PCP:  Macky Lower 03/24/2015,  8:28 PM   Addendum: I received a call from the visiting nurse about 7:20 this evening. The patient had reported a fever of 101.9 down to 100.4 after oral Tylenol. The patient's blood pressure was reported to be somewhat low at 102 systolic and a pulse of 98. This was down slightly and up slightly respectively from her visit early this morning. The patient had no specific complaints. The visiting nurse reported no abdominal distention and scant drainage at the gastrostomy tube site. There was no report of abdominal pain. No report of shortness of breath.  Options for management were reviewed with the visiting nurse and through her to the patient: 1) observation at home with hydration and Tylenol as needed for fever versus 2) readmission to the hospital. At the time of the conversation the patient has elected to remain home but has been encouraged to call promptly if any new symptoms develop.

## 2015-03-24 NOTE — Patient Instructions (Addendum)
Stop metoCLOPramide (REGLAN)   Patient is scheduled for a CT of the Abdomen with contrast at [facility] on 03/26/15 at 10:30 am. She will arrive by 10:00 am. She will pick up a prep kit. The patient is to have no solid foods 4 hours prior but may have clear liquids. She will pre medicate for this test with Prednisone 50 mg 13 hours, 7 hours, and 1 hour prior to the procedure. She will also take Benadryl 50 mg 1 hour prior to the procedure. Patient is aware of date, time, and instructions.

## 2015-03-25 ENCOUNTER — Other Ambulatory Visit: Payer: Self-pay | Admitting: General Surgery

## 2015-03-25 ENCOUNTER — Telehealth: Payer: Self-pay | Admitting: *Deleted

## 2015-03-25 DIAGNOSIS — I272 Other secondary pulmonary hypertension: Secondary | ICD-10-CM | POA: Diagnosis not present

## 2015-03-25 DIAGNOSIS — Z431 Encounter for attention to gastrostomy: Secondary | ICD-10-CM | POA: Diagnosis not present

## 2015-03-25 DIAGNOSIS — D62 Acute posthemorrhagic anemia: Secondary | ICD-10-CM | POA: Diagnosis not present

## 2015-03-25 DIAGNOSIS — E119 Type 2 diabetes mellitus without complications: Secondary | ICD-10-CM | POA: Diagnosis not present

## 2015-03-25 DIAGNOSIS — M329 Systemic lupus erythematosus, unspecified: Secondary | ICD-10-CM | POA: Diagnosis not present

## 2015-03-25 DIAGNOSIS — N289 Disorder of kidney and ureter, unspecified: Secondary | ICD-10-CM

## 2015-03-25 DIAGNOSIS — Z48815 Encounter for surgical aftercare following surgery on the digestive system: Secondary | ICD-10-CM | POA: Diagnosis not present

## 2015-03-25 LAB — COMPREHENSIVE METABOLIC PANEL
A/G RATIO: 1.8 (ref 1.1–2.5)
ALK PHOS: 55 IU/L (ref 39–117)
ALT: 25 IU/L (ref 0–32)
AST: 18 IU/L (ref 0–40)
Albumin: 3.5 g/dL (ref 3.5–4.8)
BUN / CREAT RATIO: 25 (ref 11–26)
BUN: 32 mg/dL — ABNORMAL HIGH (ref 8–27)
Bilirubin Total: <0.2 mg/dL (ref 0.0–1.2)
CALCIUM: 8.6 mg/dL — AB (ref 8.7–10.3)
CO2: 21 mmol/L (ref 18–29)
Chloride: 106 mmol/L (ref 97–108)
Creatinine, Ser: 1.28 mg/dL — ABNORMAL HIGH (ref 0.57–1.00)
GFR calc Af Amer: 46 mL/min/{1.73_m2} — ABNORMAL LOW (ref >59)
GFR calc non Af Amer: 40 mL/min/{1.73_m2} — ABNORMAL LOW (ref >59)
GLOBULIN, TOTAL: 1.9 g/dL (ref 1.5–4.5)
GLUCOSE: 126 mg/dL — AB (ref 65–99)
Potassium: 4.9 mmol/L (ref 3.5–5.2)
SODIUM: 143 mmol/L (ref 134–144)
Total Protein: 5.4 g/dL — ABNORMAL LOW (ref 6.0–8.5)

## 2015-03-25 LAB — CBC WITH DIFFERENTIAL/PLATELET
Basophils Absolute: 0 10*3/uL (ref 0.0–0.2)
Basos: 1 %
EOS (ABSOLUTE): 0.1 10*3/uL (ref 0.0–0.4)
EOS: 1 %
Hematocrit: 31.3 % — ABNORMAL LOW (ref 34.0–46.6)
Hemoglobin: 9.5 g/dL — ABNORMAL LOW (ref 11.1–15.9)
IMMATURE GRANS (ABS): 0 10*3/uL (ref 0.0–0.1)
Immature Granulocytes: 0 %
LYMPHS ABS: 0.6 10*3/uL — AB (ref 0.7–3.1)
Lymphs: 8 %
MCH: 27.9 pg (ref 26.6–33.0)
MCHC: 30.4 g/dL — AB (ref 31.5–35.7)
MCV: 92 fL (ref 79–97)
MONOS ABS: 0.7 10*3/uL (ref 0.1–0.9)
Monocytes: 10 %
NEUTROS PCT: 80 %
Neutrophils Absolute: 5.5 10*3/uL (ref 1.4–7.0)
Platelets: 613 10*3/uL — ABNORMAL HIGH (ref 150–379)
RBC: 3.41 x10E6/uL — AB (ref 3.77–5.28)
RDW: 15.9 % — ABNORMAL HIGH (ref 12.3–15.4)
WBC: 6.8 10*3/uL (ref 3.4–10.8)

## 2015-03-25 NOTE — Telephone Encounter (Signed)
-----   Message from Robert Bellow, MD sent at 03/25/2015  7:31 AM EDT ----- Patient called last night with a temperature of 101.9. Please contact her this morning and see how she's doing. Notify her blood counts look pretty good except she needs to drink more. If this is not possible need to put her in the hospital for hydration. The patient will benefit from getting a liter of IV fluids prior to her upcoming CT at [facility] scheduled for tomorrow. See if this can be arranged through special procedures making use of the renal protection protocol. ----- Message -----    From: [facility] Lab Results In Interface    Sent: 03/25/2015   5:38 AM      To: Robert Bellow, MD

## 2015-03-25 NOTE — H&P (Signed)
HPI Diana [name] is a 80 y.o. female here today following up from exploratory laparotomy done on 03/11/15. She states she is not feeling well. Patient is having so nausea and vomiting this morning.  HPI  Past Medical History  Diagnosis Date  . Lupus (systemic lupus erythematosus)   . Pulmonary hypertension   . Pneumonia   . Unspecified menopausal and postmenopausal disorder   . Primary pulmonary hypertension   . Acute glomerulonephritis with other specified pathological lesion in kidney in disease classified elsewhere(580.81)   . Unspecified hypothyroidism   . Shingles (herpes zoster) polyneuropathy March 2013    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  . Hiatal hernia   . DVT (deep venous thrombosis)   . Cancer     Breast    Past Surgical History  Procedure Laterality Date  . Kyphoplasty N/A 02/10/2015    Procedure: [id number]; Surgeon: Hessie Knows, MD; Location: [facility] ORS; Service: Orthopedics; Laterality: N/A;  . Laparotomy N/A 03/11/2015    Procedure: REDUCTION OF GASTRIC VOLVULUS, SPLEENECTOMY, GASTRIC TUBE PLACEMENT; Surgeon: Robert Bellow, MD; Location: [facility] ORS; Service: General; Laterality: N/A;    No family history on file.  Social History History  Substance Use Topics  . Smoking status: Former Smoker    Quit date: 05/25/1991  . Smokeless tobacco: Never Used  . Alcohol Use: Yes     Comment: rare    Allergies  Allergen Reactions  . Amoxicillin Other (See Comments)    Reaction: Stomach cramps   . Cefuroxime Itching  . Contrast Media [Iodinated Diagnostic Agents] Hives  . Cephalexin Rash  . Sulfonamide Derivatives Rash  . Tramadol Hcl Rash  . Venlafaxine Rash    Current Outpatient Prescriptions  Medication Sig Dispense Refill  . ALPRAZolam (XANAX) 0.5 MG tablet Take 0.5 mg by mouth at bedtime as needed for anxiety.      . budesonide-formoterol (SYMBICORT) 160-4.5 MCG/ACT inhaler Inhale 2 puffs into the lungs 2 (two) times daily.    . Calcium Carbonate-Vitamin D (CALCIUM + D PO) Take 1 tablet by mouth daily.    . cyanocobalamin (,VITAMIN B-12,) 1000 MCG/ML injection Inject 1 mL (1,000 mcg total) into the muscle every 30 (thirty) days. 10 mL 1  . enoxaparin (LOVENOX) 60 MG/0.6ML injection Inject 0.6 mLs (60 mg total) into the skin every 12 (twelve) hours. 4 Syringe 0  . fluticasone (FLONASE) 50 MCG/ACT nasal spray Place 2 sprays into both nostrils daily as needed for rhinitis.    . furosemide (LASIX) 20 MG tablet Take 20 mg by mouth daily.    Marland Kitchen HYDROcodone-acetaminophen (NORCO/VICODIN) 5-325 MG per tablet Take 1 tablet by mouth every 6 (six) hours as needed for moderate pain. 90 tablet 0  . HYDROcodone-acetaminophen (NORCO/VICODIN) 5-325 MG per tablet Take 1-2 tablets by mouth every 4 (four) hours as needed for moderate pain. 30 tablet 0  . hydroxychloroquine (PLAQUENIL) 200 MG tablet Take 200 mg by mouth daily.    Marland Kitchen letrozole (FEMARA) 2.5 MG tablet Take 2.5 mg by mouth daily.    Marland Kitchen LORazepam (ATIVAN) 2 MG/ML injection Inject 0.25 mLs (0.5 mg total) into the vein every 6 (six) hours as needed for anxiety. 1 mL 0  . metoCLOPramide (REGLAN) 5 MG tablet Take 1 tablet (5 mg total) by mouth 4 (four) times daily - before meals and at bedtime. 40 tablet 0  . metoprolol succinate (TOPROL-XL) 25 MG 24 hr tablet Take 25 mg by mouth daily.    . mycophenolate (CELLCEPT) 500 MG tablet Take 1,000 mg  by mouth 2 (two) times daily.     Marland Kitchen omeprazole (PRILOSEC) 40 MG capsule Take 40 mg by mouth daily.    . ondansetron (ZOFRAN-ODT) 4 MG disintegrating tablet Take 1 tablet (4 mg total) by mouth every 4 (four) hours as needed for nausea or vomiting. 20 tablet 0  . predniSONE (DELTASONE) 5 MG tablet Take 5 mg by mouth daily.    . ranitidine (ZANTAC) 150  MG tablet Take 1 tablet (150 mg total) by mouth at bedtime. 90 tablet 2  . SYNTHROID 150 MCG tablet Take 1 tablet (150 mcg total) by mouth daily. 30 tablet 11  . Tadalafil, PAH, (ADCIRCA) 20 MG TABS Take 40 mg by mouth daily.    Marland Kitchen warfarin (COUMADIN) 5 MG tablet Take 1 tablet (5 mg total) by mouth daily. 30 tablet 3  . diphenhydrAMINE (BENADRYL) 50 MG tablet Take 1 tablet (50 mg total) by mouth once. Take 50 mg Benadryl 1 hour prior to CT scan 1 tablet 0  . predniSONE (DELTASONE) 50 MG tablet Take 1 tablet 13 hours, 7 hours, and 1 hour prior to CT scan 3 tablet 0   No current facility-administered medications for this visit.    Review of Systems Review of Systems  Constitutional: Positive for fever and chills.  Respiratory: Negative.  Cardiovascular: Negative.  Gastrointestinal: Positive for nausea, vomiting, abdominal pain and diarrhea. Negative for abdominal distention.    Blood pressure 122/68, pulse 72, temperature 99.7 F (37.6 C), resp. rate 14, height 5' 4"  (1.626 m), weight 138 lb (62.596 kg).  Physical Exam Physical Exam  Constitutional: She is oriented to person, place, and time. She appears well-developed and well-nourished.  HENT:  Mouth/Throat: Oropharynx is clear and moist.  Eyes: Conjunctivae are normal. No scleral icterus.  Neck: Neck supple.  Cardiovascular: Normal rate, regular rhythm and normal heart sounds.  Pulmonary/Chest: Effort normal and breath sounds normal.  Abdominal: Soft. Normal appearance and bowel sounds are normal. There is no tenderness.    Neurological: She is alert and oriented to person, place, and time.  Skin: Skin is warm and dry.  Drain was removed.   Data Reviewed Barium swallow completed prior to discharge showed slow passage of contrast from the small residual portion of the stomach above the diaphragm to the majority of the stomach below. No other impediment to emptying.  Plain films of the abdomen  had shown no specific abdominal pattern.  Assessment    Persistent postprandial nausea, intermittent vomiting.  Mild volume dehydration.  History of fever, no clinically evident source.  Urinalysis/culture ordered during hospitalization was not collected. ( patient denies any urinary symptoms, past history asymptomatic UTI) .    Plan    The patient's clinical exam is very unremarkable.  Plan was to remove the sutures at the gastrostomy tube site. It was found that the tube was gently extruding and that the balloon had deflated spontaneously. The catheter was removed without difficulty. Prior to removal, irrigation through the feeding channel had produced some mild discomfort in the abdomen, the patient experienced no discomfort during flushing of the gastrostomy portion. There was minimal discharge from the gastrostomy site after removal and a dry dressing was applied.    Arrangements were made for a CBC and comprehensive metabolic panel today. The patient will continue to make use of nutritional supplements such as ensure and maintain a diet as tolerated.  Attempts were made to have a CT of the chest and abdomen completed, based on the patient's history of  fever, history of SLE with pulmonary involvement and her recent reduction of a large hiatal hernia with gastric volvulus. No coding diagnosis could be made to satisfy requirements for a chest CT, my hope is that the abdominal CT will show enough of the lower chest to clear any areas of concern related to her recent surgery.  Patient is scheduled for a CT of the Abdomen with contrast at [facility] on 03/26/15 at 10:30 am. She will arrive by 10:00 am. She will pick up a prep kit. The patient is to have no solid foods 4 hours prior but may have clear liquids. She will pre medicate for this test with Prednisone 50 mg 13 hours, 7 hours, and 1 hour prior to the procedure. She will also take Benadryl 50 mg 1 hour prior to the procedure. Patient is aware  of date, time, and instructions.   PCP: Wende Mott 03/24/2015, 8:28 PM   Addendum: I received a call from the visiting nurse about 7:20 this evening. The patient had reported a fever of 101.9 down to 100.4 after oral Tylenol. The patient's blood pressure was reported to be somewhat low at 251 systolic and a pulse of 98. This was down slightly and up slightly respectively from her visit early this morning. The patient had no specific complaints. The visiting nurse reported no abdominal distention and scant drainage at the gastrostomy tube site. There was no report of abdominal pain. No report of shortness of breath.  Options for management were reviewed with the visiting nurse and through her to the patient: 1) observation at home with hydration and Tylenol as needed for fever versus 2) readmission to the hospital. At the time of the conversation the patient has elected to remain home but has been encouraged to call promptly if any new symptoms develop.         Labs show moderate dehydration with rise in creatinine and fall in eGFR. Pre CT hydration indicated to protect renal function.

## 2015-03-25 NOTE — Telephone Encounter (Signed)
Call Documentation      Carson Myrtle, RN at 03/25/2015 8:38 AM     Status: Signed       Expand All Collapse All   Notified patient as instructed. She was just waking up and had not taken her temperature as of yet. Discussed arrangements for CT appointments, patient agrees. We will call her if there is anything she needs to do different.            Carson Myrtle, RN at 03/25/2015 8:37 AM     Status: Signed       Expand All Collapse All   ----- Message from Robert Bellow, MD sent at 03/25/2015 7:31 AM EDT ----- Patient called last night with a temperature of 101.9. Please contact her this morning and see how she's doing. Notify her blood counts look pretty good except she needs to drink more. If this is not possible need to put her in the hospital for hydration. The patient will benefit from getting a liter of IV fluids prior to her upcoming CT at [facility] scheduled for tomorrow. See if this can be arranged through special procedures making use of the renal protection protocol. ----- Message -----  From: [place]: 03/25/2015 5:38 AM  To: Robert Bellow, MD       Per [place], in [place], the rules have changed and this now has to be completed through Same Day Surgery.   I spoke with Melissa in Same Day Surgery and patient will report to [facility] tomorrow at 8 am. She will check in at the [place] registration desk to get pre-registered and they will direct her to the 2nd floor once they are finished.   Patient notified of new arrival time for tomorrow and she verbalizes understanding.

## 2015-03-25 NOTE — Telephone Encounter (Signed)
Notified patient as instructed. She was just waking up and had not taken her temperature as of yet. Discussed arrangements for CT appointments, patient agrees. We will call her if there is anything she needs to do different.

## 2015-03-26 ENCOUNTER — Other Ambulatory Visit: Payer: Self-pay | Admitting: General Surgery

## 2015-03-26 ENCOUNTER — Observation Stay: Payer: Medicare Other

## 2015-03-26 ENCOUNTER — Ambulatory Visit
Admission: RE | Admit: 2015-03-26 | Discharge: 2015-03-26 | Disposition: A | Discharge status: Home / Self Care | Payer: Medicare Other | Source: Ambulatory Visit | Attending: General Surgery | Admitting: General Surgery

## 2015-03-26 ENCOUNTER — Inpatient Hospital Stay
Admission: AD | Admit: 2015-03-26 | Discharge: 2015-04-03 | DRG: 391 | Disposition: A | Discharge status: Home / Self Care | Payer: Medicare Other | Source: Ambulatory Visit | Attending: General Surgery | Admitting: General Surgery

## 2015-03-26 VITALS — BP 110/53 | HR 92 | Temp 99.2°F | Resp 16 | Ht 62.0 in | Wt 158.8 lb

## 2015-03-26 DIAGNOSIS — N289 Disorder of kidney and ureter, unspecified: Secondary | ICD-10-CM

## 2015-03-26 DIAGNOSIS — Z9981 Dependence on supplemental oxygen: Secondary | ICD-10-CM

## 2015-03-26 DIAGNOSIS — J189 Pneumonia, unspecified organism: Secondary | ICD-10-CM

## 2015-03-26 DIAGNOSIS — Z880 Allergy status to penicillin: Secondary | ICD-10-CM

## 2015-03-26 DIAGNOSIS — R112 Nausea with vomiting, unspecified: Secondary | ICD-10-CM

## 2015-03-26 DIAGNOSIS — Z86718 Personal history of other venous thrombosis and embolism: Secondary | ICD-10-CM

## 2015-03-26 DIAGNOSIS — R791 Abnormal coagulation profile: Secondary | ICD-10-CM | POA: Diagnosis present

## 2015-03-26 DIAGNOSIS — Z452 Encounter for adjustment and management of vascular access device: Secondary | ICD-10-CM

## 2015-03-26 DIAGNOSIS — R110 Nausea: Secondary | ICD-10-CM

## 2015-03-26 DIAGNOSIS — R509 Fever, unspecified: Secondary | ICD-10-CM

## 2015-03-26 DIAGNOSIS — J9811 Atelectasis: Secondary | ICD-10-CM | POA: Diagnosis not present

## 2015-03-26 DIAGNOSIS — K2210 Ulcer of esophagus without bleeding: Secondary | ICD-10-CM | POA: Diagnosis present

## 2015-03-26 DIAGNOSIS — N189 Chronic kidney disease, unspecified: Secondary | ICD-10-CM | POA: Diagnosis present

## 2015-03-26 DIAGNOSIS — M329 Systemic lupus erythematosus, unspecified: Secondary | ICD-10-CM | POA: Diagnosis not present

## 2015-03-26 DIAGNOSIS — E039 Hypothyroidism, unspecified: Secondary | ICD-10-CM | POA: Diagnosis present

## 2015-03-26 DIAGNOSIS — I509 Heart failure, unspecified: Secondary | ICD-10-CM | POA: Diagnosis not present

## 2015-03-26 DIAGNOSIS — I270 Primary pulmonary hypertension: Secondary | ICD-10-CM | POA: Diagnosis not present

## 2015-03-26 DIAGNOSIS — Z7901 Long term (current) use of anticoagulants: Secondary | ICD-10-CM

## 2015-03-26 DIAGNOSIS — K210 Gastro-esophageal reflux disease with esophagitis: Secondary | ICD-10-CM | POA: Diagnosis not present

## 2015-03-26 DIAGNOSIS — Z79899 Other long term (current) drug therapy: Secondary | ICD-10-CM

## 2015-03-26 DIAGNOSIS — E860 Dehydration: Secondary | ICD-10-CM | POA: Diagnosis present

## 2015-03-26 DIAGNOSIS — K3189 Other diseases of stomach and duodenum: Secondary | ICD-10-CM

## 2015-03-26 DIAGNOSIS — N179 Acute kidney failure, unspecified: Secondary | ICD-10-CM | POA: Diagnosis not present

## 2015-03-26 DIAGNOSIS — Z7951 Long term (current) use of inhaled steroids: Secondary | ICD-10-CM

## 2015-03-26 DIAGNOSIS — R5082 Postprocedural fever: Secondary | ICD-10-CM

## 2015-03-26 DIAGNOSIS — Z87891 Personal history of nicotine dependence: Secondary | ICD-10-CM

## 2015-03-26 DIAGNOSIS — Z9889 Other specified postprocedural states: Secondary | ICD-10-CM

## 2015-03-26 DIAGNOSIS — J181 Lobar pneumonia, unspecified organism: Secondary | ICD-10-CM

## 2015-03-26 DIAGNOSIS — R918 Other nonspecific abnormal finding of lung field: Secondary | ICD-10-CM | POA: Diagnosis not present

## 2015-03-26 DIAGNOSIS — Z79891 Long term (current) use of opiate analgesic: Secondary | ICD-10-CM

## 2015-03-26 DIAGNOSIS — K449 Diaphragmatic hernia without obstruction or gangrene: Secondary | ICD-10-CM | POA: Diagnosis present

## 2015-03-26 DIAGNOSIS — R4702 Dysphasia: Secondary | ICD-10-CM | POA: Diagnosis present

## 2015-03-26 DIAGNOSIS — K5790 Diverticulosis of intestine, part unspecified, without perforation or abscess without bleeding: Secondary | ICD-10-CM | POA: Diagnosis present

## 2015-03-26 DIAGNOSIS — E876 Hypokalemia: Secondary | ICD-10-CM | POA: Diagnosis present

## 2015-03-26 HISTORY — DX: Heart failure, unspecified: I50.9

## 2015-03-26 LAB — CREATININE, SERUM
Creatinine, Ser: 1.47 mg/dL — ABNORMAL HIGH (ref 0.44–1.00)
GFR calc Af Amer: 38 mL/min — ABNORMAL LOW (ref >60)
GFR, EST NON AFRICAN AMERICAN: 33 mL/min — AB (ref >60)

## 2015-03-26 LAB — PROTIME-INR
INR: 2.7
PROTHROMBIN TIME: 28.8 s — AB (ref 11.4–15.0)

## 2015-03-26 MED ORDER — PNEUMOCOCCAL VAC POLYVALENT 25 MCG/0.5ML IJ INJ: 0.5000 mL | INJECTION | INTRAMUSCULAR | Status: DC

## 2015-03-26 MED ORDER — ACETAMINOPHEN 325 MG PO TABS
650.0000 mg | ORAL_TABLET | Freq: Four times a day (QID) | ORAL | Status: DC | PRN
  Administered 2015-03-27 – 2015-04-02 (×6): 650 mg via ORAL
  Filled 2015-03-26 (×6): qty 2

## 2015-03-26 MED ORDER — HYDROCODONE-ACETAMINOPHEN 5-325 MG PO TABS
1.0000 | ORAL_TABLET | ORAL | Status: DC | PRN
  Administered 2015-03-26: 1 via ORAL
  Administered 2015-03-31: 2 via ORAL
  Administered 2015-04-01: 1 via ORAL
  Administered 2015-04-02: 2 via ORAL
  Filled 2015-03-26: qty 1
  Filled 2015-03-26 (×2): qty 2
  Filled 2015-03-26: qty 1
  Filled 2015-03-26: qty 2

## 2015-03-26 MED ORDER — ONDANSETRON HCL 4 MG PO TABS: 4.0000 mg | ORAL_TABLET | Freq: Four times a day (QID) | ORAL | Status: DC | PRN

## 2015-03-26 MED ORDER — MYCOPHENOLATE MOFETIL 500 MG PO TABS
1000.0000 mg | ORAL_TABLET | Freq: Two times a day (BID) | ORAL | Status: DC
  Filled 2015-03-26 (×4): qty 2

## 2015-03-26 MED ORDER — SODIUM CHLORIDE 0.45 % IV SOLN
INTRAVENOUS | Status: DC
  Administered 2015-03-26 – 2015-03-27 (×3): via INTRAVENOUS

## 2015-03-26 MED ORDER — BUDESONIDE-FORMOTEROL FUMARATE 160-4.5 MCG/ACT IN AERO
2.0000 | INHALATION_SPRAY | Freq: Two times a day (BID) | RESPIRATORY_TRACT | Status: DC
  Administered 2015-03-26 – 2015-04-02 (×11): 2 via RESPIRATORY_TRACT
  Filled 2015-03-26: qty 6

## 2015-03-26 MED ORDER — ALPRAZOLAM 0.5 MG PO TABS
0.5000 mg | ORAL_TABLET | Freq: Every evening | ORAL | Status: DC | PRN
  Administered 2015-03-31: 0.5 mg via ORAL
  Filled 2015-03-26: qty 1

## 2015-03-26 MED ORDER — PROMETHAZINE HCL 25 MG/ML IJ SOLN
6.2500 mg | INTRAMUSCULAR | Status: DC | PRN
  Administered 2015-03-26 – 2015-03-31 (×6): 6.25 mg via INTRAVENOUS
  Filled 2015-03-26 (×6): qty 1

## 2015-03-26 MED ORDER — FLUTICASONE PROPIONATE 50 MCG/ACT NA SUSP
2.0000 | Freq: Every day | NASAL | Status: DC | PRN
  Administered 2015-04-01: 2 via NASAL
  Filled 2015-03-26: qty 16

## 2015-03-26 MED ORDER — HYDROXYCHLOROQUINE SULFATE 200 MG PO TABS
200.0000 mg | ORAL_TABLET | Freq: Every day | ORAL | Status: DC
  Administered 2015-03-27 – 2015-04-01 (×5): 200 mg via ORAL
  Filled 2015-03-26 (×8): qty 1

## 2015-03-26 MED ORDER — PREDNISONE 10 MG PO TABS
5.0000 mg | ORAL_TABLET | Freq: Every day | ORAL | Status: DC
  Administered 2015-03-27 – 2015-04-02 (×6): 5 mg via ORAL
  Filled 2015-03-26 (×6): qty 1
  Filled 2015-03-26: qty 2
  Filled 2015-03-26: qty 1

## 2015-03-26 MED ORDER — TADALAFIL (PAH) 20 MG PO TABS
40.0000 mg | ORAL_TABLET | Freq: Every day | ORAL | Status: DC
  Administered 2015-03-27 – 2015-04-01 (×5): 40 mg via ORAL
  Filled 2015-03-26 (×10): qty 2

## 2015-03-26 MED ORDER — PROMETHAZINE HCL 25 MG/ML IJ SOLN
INTRAMUSCULAR | Status: AC
  Administered 2015-03-26: 6.25 mg via INTRAVENOUS
  Filled 2015-03-26: qty 1

## 2015-03-26 MED ORDER — MYCOPHENOLATE MOFETIL 250 MG PO CAPS
1000.0000 mg | ORAL_CAPSULE | Freq: Two times a day (BID) | ORAL | Status: DC
  Administered 2015-03-26 – 2015-04-02 (×12): 1000 mg via ORAL
  Filled 2015-03-26 (×17): qty 4

## 2015-03-26 MED ORDER — ONDANSETRON 4 MG PO TBDP: 4.0000 mg | ORAL_TABLET | ORAL | Status: DC | PRN

## 2015-03-26 MED ORDER — FAMOTIDINE IN NACL 20-0.9 MG/50ML-% IV SOLN
20.0000 mg | Freq: Two times a day (BID) | INTRAVENOUS | Status: DC
  Administered 2015-03-26 – 2015-03-29 (×5): 20 mg via INTRAVENOUS
  Filled 2015-03-26 (×9): qty 50

## 2015-03-26 MED ORDER — SODIUM CHLORIDE 0.45 % IV SOLN
INTRAVENOUS | Status: DC
  Administered 2015-03-26: 12:00:00 via INTRAVENOUS

## 2015-03-26 MED ORDER — LEVOTHYROXINE SODIUM 150 MCG PO TABS
150.0000 ug | ORAL_TABLET | Freq: Every day | ORAL | Status: DC
  Administered 2015-03-27 – 2015-04-03 (×7): 150 ug via ORAL
  Filled 2015-03-26 (×9): qty 1

## 2015-03-26 MED ORDER — LETROZOLE 2.5 MG PO TABS
2.5000 mg | ORAL_TABLET | Freq: Every day | ORAL | Status: DC
  Administered 2015-03-27 – 2015-04-02 (×6): 2.5 mg via ORAL
  Filled 2015-03-26 (×8): qty 1

## 2015-03-26 MED ORDER — HYDROCODONE-ACETAMINOPHEN 5-325 MG PO TABS: 1.0000 | ORAL_TABLET | Freq: Four times a day (QID) | ORAL | Status: DC | PRN

## 2015-03-26 MED ORDER — IOHEXOL 300 MG/ML  SOLN
100.0000 mL | Freq: Once | INTRAMUSCULAR | Status: AC | PRN
  Administered 2015-03-26: 85 mL via INTRAVENOUS

## 2015-03-26 MED ORDER — MORPHINE SULFATE 2 MG/ML IJ SOLN
1.0000 mg | INTRAMUSCULAR | Status: DC | PRN
  Administered 2015-03-27 – 2015-04-01 (×5): 1 mg via INTRAVENOUS
  Filled 2015-03-26 (×5): qty 1

## 2015-03-26 MED ORDER — SODIUM CHLORIDE 0.45 % IV SOLN
Freq: Once | INTRAVENOUS | Status: AC
  Administered 2015-03-26: 09:00:00 via INTRAVENOUS
  Filled 2015-03-26 (×2): qty 50

## 2015-03-26 MED ORDER — METOPROLOL SUCCINATE ER 25 MG PO TB24
25.0000 mg | ORAL_TABLET | Freq: Every day | ORAL | Status: DC
  Administered 2015-03-27 – 2015-03-30 (×4): 25 mg via ORAL
  Filled 2015-03-26 (×5): qty 1

## 2015-03-26 MED ORDER — CYANOCOBALAMIN 1000 MCG/ML IJ SOLN
1000.0000 ug | INTRAMUSCULAR | Status: DC
  Filled 2015-03-26: qty 1

## 2015-03-26 MED ORDER — SODIUM CHLORIDE 0.45 % IV SOLN: Freq: Once | INTRAVENOUS | Status: DC

## 2015-03-26 MED ORDER — ONDANSETRON HCL 4 MG/2ML IJ SOLN
4.0000 mg | Freq: Four times a day (QID) | INTRAMUSCULAR | Status: DC | PRN
  Administered 2015-03-27 – 2015-04-03 (×8): 4 mg via INTRAVENOUS
  Filled 2015-03-26 (×11): qty 2

## 2015-03-26 MED ORDER — ACETAMINOPHEN 650 MG RE SUPP: 650.0000 mg | Freq: Four times a day (QID) | RECTAL | Status: DC | PRN

## 2015-03-26 MED ORDER — LEVOFLOXACIN IN D5W 500 MG/100ML IV SOLN
500.0000 mg | Freq: Once | INTRAVENOUS | Status: AC
  Administered 2015-03-26: 500 mg via INTRAVENOUS
  Filled 2015-03-26: qty 100

## 2015-03-26 NOTE — OR Nursing (Signed)
One drop peppermint oil on cotton ball for relief of nausea.  Safety assessment complete and verbal patient consent for aromatherapy obtained

## 2015-03-26 NOTE — OR Nursing (Signed)
CT notified that patient is unable to drink all prescribed contrast dye.  States "i will vomit is I take another sip"

## 2015-03-26 NOTE — OR Nursing (Signed)
Prednisone 50 mg and benadryl 50 mg given at 9:15 as ordered. 1 bottle of contrast given at 9:20

## 2015-03-26 NOTE — H&P (Signed)
Diana [name] is an 80 y.o. female.   Chief Complaint: Poor oral intake, fever. HPI: The patient was recently admitted with evidence of a gastric volvulus. She underwent gastropexy and incidental splenectomy. She's had poor oral intake since discharge, reporting difficulty with swallowing. Predischarge upper GI series showed mildly slow transit between the portion of the stomach above the diaphragm and below the diaphragm, but no evidence of torsion or malrotation.  Her creatinine has risen since discharge and her pro time is markedly elevated consistent with her nutritional status. CT scan was obtained earlier this morning tablet evaluate for her source of fever.  Past Medical History  Diagnosis Date  . Lupus (systemic lupus erythematosus)   . Pulmonary hypertension   . Pneumonia   . Unspecified menopausal and postmenopausal disorder   . Primary pulmonary hypertension   . Acute glomerulonephritis with other specified pathological lesion in kidney in disease classified elsewhere(580.81)   . Unspecified hypothyroidism   . Shingles (herpes zoster) polyneuropathy March 2013    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  . Hiatal hernia   . DVT (deep venous thrombosis)   . Cancer     Breast  . CHF (congestive heart failure)     Past Surgical History  Procedure Laterality Date  . Kyphoplasty N/A 02/10/2015    Procedure: [id number];  Surgeon: Hessie Knows, MD;  Location: [facility] ORS;  Service: Orthopedics;  Laterality: N/A;  . Laparotomy N/A 03/11/2015    Procedure: REDUCTION OF GASTRIC VOLVULUS, SPLEENECTOMY, GASTRIC TUBE PLACEMENT;  Surgeon: Robert Bellow, MD;  Location: [facility] ORS;  Service: General;  Laterality: N/A;    No family history on file. Social History:  reports that she quit smoking about 23 years ago. She has never used smokeless tobacco. She reports that she drinks alcohol. She reports that she does not use illicit drugs.  Allergies:  Allergies  Allergen Reactions  .  Amoxicillin Other (See Comments)    Reaction:  Stomach cramps   . Cefuroxime Itching  . Contrast Media [Iodinated Diagnostic Agents] Hives  . Cephalexin Rash  . Sulfonamide Derivatives Rash  . Tramadol Hcl Rash  . Venlafaxine Rash    Medications Prior to Admission  Medication Sig Dispense Refill  . ALPRAZolam (XANAX) 0.5 MG tablet Take 0.5 mg by mouth at bedtime as needed for anxiety.    . budesonide-formoterol (SYMBICORT) 160-4.5 MCG/ACT inhaler Inhale 2 puffs into the lungs 2 (two) times daily.    . Calcium Carbonate-Vitamin D (CALCIUM + D PO) Take 1 tablet by mouth daily.    . cyanocobalamin (,VITAMIN B-12,) 1000 MCG/ML injection Inject 1 mL (1,000 mcg total) into the muscle every 30 (thirty) days. 10 mL 1  . diphenhydrAMINE (BENADRYL) 50 MG tablet Take 1 tablet (50 mg total) by mouth once. Take 50 mg Benadryl 1 hour prior to CT scan 1 tablet 0  . enoxaparin (LOVENOX) 60 MG/0.6ML injection Inject 0.6 mLs (60 mg total) into the skin every 12 (twelve) hours. 4 Syringe 0  . fluticasone (FLONASE) 50 MCG/ACT nasal spray Place 2 sprays into both nostrils daily as needed for rhinitis.    . furosemide (LASIX) 20 MG tablet Take 20 mg by mouth daily.    Marland Kitchen HYDROcodone-acetaminophen (NORCO/VICODIN) 5-325 MG per tablet Take 1 tablet by mouth every 6 (six) hours as needed for moderate pain. 90 tablet 0  . HYDROcodone-acetaminophen (NORCO/VICODIN) 5-325 MG per tablet Take 1-2 tablets by mouth every 4 (four) hours as needed for moderate pain. 30 tablet 0  . hydroxychloroquine (  PLAQUENIL) 200 MG tablet Take 200 mg by mouth daily.    Marland Kitchen letrozole (FEMARA) 2.5 MG tablet Take 2.5 mg by mouth daily.    Marland Kitchen LORazepam (ATIVAN) 2 MG/ML injection Inject 0.25 mLs (0.5 mg total) into the vein every 6 (six) hours as needed for anxiety. 1 mL 0  . metoCLOPramide (REGLAN) 5 MG tablet Take 1 tablet (5 mg total) by mouth 4 (four) times daily -  before meals and at bedtime. 40 tablet 0  . metoprolol succinate (TOPROL-XL) 25  MG 24 hr tablet Take 25 mg by mouth daily.    . mycophenolate (CELLCEPT) 500 MG tablet Take 1,000 mg by mouth 2 (two) times daily.     Marland Kitchen omeprazole (PRILOSEC) 40 MG capsule Take 40 mg by mouth daily.    . ondansetron (ZOFRAN-ODT) 4 MG disintegrating tablet Take 1 tablet (4 mg total) by mouth every 4 (four) hours as needed for nausea or vomiting. 20 tablet 0  . predniSONE (DELTASONE) 5 MG tablet Take 5 mg by mouth daily.    . predniSONE (DELTASONE) 50 MG tablet Take 1 tablet 13 hours, 7 hours, and 1 hour prior to CT scan 3 tablet 0  . ranitidine (ZANTAC) 150 MG tablet Take 1 tablet (150 mg total) by mouth at bedtime. 90 tablet 2  . SYNTHROID 150 MCG tablet Take 1 tablet (150 mcg total) by mouth daily. 30 tablet 11  . Tadalafil, PAH, (ADCIRCA) 20 MG TABS Take 40 mg by mouth daily.    Marland Kitchen warfarin (COUMADIN) 5 MG tablet Take 1 tablet (5 mg total) by mouth daily. 30 tablet 3    Results for orders placed or performed during the hospital encounter of 03/26/15 (from the past 48 hour(s))  Creatinine, serum     Status: Abnormal   Collection Time: 03/26/15  8:37 AM  Result Value Ref Range   Creatinine, Ser 1.47 (H) 0.44 - 1.00 mg/dL   GFR calc non Af Amer 33 (L) >60 mL/min   GFR calc Af Amer 38 (L) >60 mL/min    Comment: (NOTE) The eGFR has been calculated using the CKD EPI equation. This calculation has not been validated in all clinical situations. eGFR's persistently <60 mL/min signify possible Chronic Kidney Disease.   Protime-INR     Status: Abnormal   Collection Time: 03/26/15  8:37 AM  Result Value Ref Range   Prothrombin Time 28.8 (H) 11.4 - 15.0 seconds   INR 2.70    Ct Abdomen W Contrast  03/26/2015   CLINICAL DATA:  Patient with prior history of gastric volvulus status post surgical repair. History of breast cancer 07/2014. Nausea and vomiting. Postprocedural fever.  EXAM: CT ABDOMEN WITH CONTRAST  TECHNIQUE: Multidetector CT imaging of the abdomen was performed using the standard  protocol following bolus administration of intravenous contrast.  CONTRAST:  21m OMNIPAQUE IOHEXOL 300 MG/ML  SOLN  COMPARISON:  CT abdomen pelvis 03/11/2015  FINDINGS: Lower chest: Heart is enlarged. Trace pericardial fluid. Consolidative opacities within the right greater than left lower lobes. There is a large portion of the stomach herniated into the chest. Small right pleural effusion.  Hepatobiliary: Liver is normal in size and contour without focal hepatic lesion identified. Gallbladder is unremarkable. No intrahepatic or extrahepatic biliary ductal dilatation.  Pancreas: Atrophic.  Spleen: Surgically absent.  Adrenals/Urinary Tract: Adrenal glands are unremarkable. Kidneys enhance symmetrically with contrast. Re- demonstrated 4 cm cyst within the superior pole of the left kidney. Subcentimeter low-attenuation lesions within the inferior pole of the  right kidney, too small to characterize.  Stomach/Bowel: There is a large portion of the stomach herniated into the chest. The stomach appears in normal orientation. Oral contrast material is demonstrated throughout the stomach and visualized small bowel. There is extensive diverticulosis involving the visualized colon. Transverse colon is relatively decompressed however there is suggestion of mild wall thickening. Midline surgical incision. No free intraperitoneal air.  Vascular/Lymphatic: Tortuosity of the abdominal aorta. No retroperitoneal lymphadenopathy.  Other: None  Musculoskeletal: Patient with L1 and L2 vertebral kyphoplasty material. Chronic L1 compression deformity.  IMPRESSION: Improved anatomic orientation of the stomach. There is still a large portion of the stomach which is herniated into thorax. No evidence for obstruction.  Consolidative opacities within the right greater than left lower lobes may represent pneumonia in the appropriate clinical setting.  The transverse colon is decompressed, limiting evaluation however there is suggestion of  possible mild wall thickening. Recommend correlation for signs of colitis.   Electronically Signed   By: Lovey Newcomer M.D.   On: 03/26/2015 11:55    Review of Systems  Constitutional: Positive for weight loss (up to 101.6) and malaise/fatigue.  HENT: Negative.   Eyes: Negative.   Respiratory: Negative for cough, hemoptysis, sputum production and wheezing. Shortness of breath: Baseline.   Cardiovascular: Negative.   Gastrointestinal: Positive for heartburn, nausea and vomiting. Negative for abdominal pain, diarrhea and constipation.  Genitourinary: Negative.   Musculoskeletal: Negative.   Skin: Negative for itching and rash.  Neurological: Negative.   Endo/Heme/Allergies: Negative.   Psychiatric/Behavioral: Negative.  Negative for hallucinations.    Blood pressure 119/67, pulse 84, temperature 98.6 F (37 C), temperature source Oral, resp. rate 17, SpO2 95 %. Physical Exam  Constitutional: No distress.  HENT:  Head: Atraumatic.  Eyes: Conjunctivae are normal.  Neck: Neck supple. No tracheal deviation present. No thyromegaly present.  Cardiovascular: Normal rate and regular rhythm.   GI: Bowel sounds are normal. She exhibits no distension. There is no tenderness.     Assessment/Plan The patient's creatinine has risen since discharge, likely secondary to poor oral intake. We'll hydrate intravenously and recheck her creatinine tomorrow. With the CT suggesting right lower lobe pneumonia, and no other clear source for her elevated temperature, she'll be placed on Levaquin due to her penicillin/Cephalosporium allergies. We'll give her 1 dose of 500 mg IV to start, and then see what her creatinine is tomorrow. Dosing has been discussed with pharmacy.  There is no evidence of malrotation of the stomach to account for the patient's reported difficulty with oral alimentation. No leak from the gastrostomy tube site removed 2 days ago.  Diverticulosis is identified on CT, but nothing to suggest  diverticulitis or cholecystitis.  Robert Bellow 03/26/2015, 3:28 PM

## 2015-03-26 NOTE — H&P (Signed)
Brief note: Poor po since discharge, Fevers to 101.4 at home. CT today suggests RLL pneumonia.  No other fever source noted. Amendable to 24 hours admission to start ATB and reestablish fluid volume.

## 2015-03-26 NOTE — OR Nursing (Signed)
To CT per stretcher

## 2015-03-26 NOTE — OR Nursing (Signed)
Returned to rm 12 in SDS from CT.Dr. Terri Piedra notified.  Voided 100cc on [facility]

## 2015-03-27 ENCOUNTER — Other Ambulatory Visit: Payer: Self-pay | Admitting: *Deleted

## 2015-03-27 DIAGNOSIS — C50412 Malignant neoplasm of upper-outer quadrant of left female breast: Secondary | ICD-10-CM

## 2015-03-27 LAB — BASIC METABOLIC PANEL
ANION GAP: 7 (ref 5–15)
BUN: 19 mg/dL (ref 6–20)
CO2: 26 mmol/L (ref 22–32)
CREATININE: 1.01 mg/dL — AB (ref 0.44–1.00)
Calcium: 7.9 mg/dL — ABNORMAL LOW (ref 8.9–10.3)
Chloride: 106 mmol/L (ref 101–111)
GFR calc non Af Amer: 52 mL/min — ABNORMAL LOW (ref >60)
Glucose, Bld: 119 mg/dL — ABNORMAL HIGH (ref 65–99)
POTASSIUM: 3.8 mmol/L (ref 3.5–5.1)
Sodium: 139 mmol/L (ref 135–145)

## 2015-03-27 LAB — CBC
HCT: 25.1 % — ABNORMAL LOW (ref 35.0–47.0)
Hemoglobin: 8.1 g/dL — ABNORMAL LOW (ref 12.0–16.0)
MCH: 29.6 pg (ref 26.0–34.0)
MCHC: 32.1 g/dL (ref 32.0–36.0)
MCV: 92.3 fL (ref 80.0–100.0)
Platelets: 446 10*3/uL — ABNORMAL HIGH (ref 150–440)
RBC: 2.72 MIL/uL — AB (ref 3.80–5.20)
RDW: 16.1 % — ABNORMAL HIGH (ref 11.5–14.5)
WBC: 4.7 10*3/uL (ref 3.6–11.0)

## 2015-03-27 LAB — PROTIME-INR
INR: 3.28
Prothrombin Time: 33.4 seconds — ABNORMAL HIGH (ref 11.4–15.0)

## 2015-03-27 MED ORDER — SODIUM CHLORIDE 0.9 % IV SOLN: INTRAVENOUS | Status: DC

## 2015-03-27 MED ORDER — SODIUM CHLORIDE 0.9 % IV SOLN
INTRAVENOUS | Status: DC
  Administered 2015-03-28 (×2): via INTRAVENOUS

## 2015-03-27 MED ORDER — LEVOFLOXACIN IN D5W 250 MG/50ML IV SOLN
250.0000 mg | INTRAVENOUS | Status: DC
  Administered 2015-03-28 – 2015-03-31 (×4): 250 mg via INTRAVENOUS
  Filled 2015-03-27 (×6): qty 50

## 2015-03-27 MED ORDER — SODIUM CHLORIDE 0.9 % IV SOLN
Freq: Once | INTRAVENOUS | Status: AC
  Administered 2015-03-27: 15:00:00 via INTRAVENOUS

## 2015-03-27 MED ORDER — FUROSEMIDE 10 MG/ML IJ SOLN
20.0000 mg | Freq: Once | INTRAMUSCULAR | Status: AC
  Administered 2015-03-27: 20 mg via INTRAVENOUS
  Filled 2015-03-27: qty 2

## 2015-03-27 MED ORDER — PHYTONADIONE 5 MG PO TABS
5.0000 mg | ORAL_TABLET | Freq: Once | ORAL | Status: DC
Start: 2015-03-27 — End: 2015-03-28
  Filled 2015-03-27: qty 1

## 2015-03-27 NOTE — Care Management (Signed)
Patient is open to [facility], OT, and Aid will need resumption of care order when discharged.

## 2015-03-27 NOTE — Progress Notes (Signed)
Afebrile since admission. Vital signs are stable. The patient reports emesis this morning approximately one-2 hours after breakfast. The nurse reports this was a barely measurable amount of mucus like material. No evidence of ingested food. Patient still reports significant nausea.  No shortness of breath at rest. She does experience some mild shortness of breath with ambulation.  History of pulmonary hypertension, previous pulmonary embolus, systemic lupus with pulmonary compromise.  Examination of the chest showed clear breath sounds bilaterally.  The abdomen shows mild distention, normal bowel sounds. Perfectly soft and nontender. Midline wound is healing well. No drainage at gastrostomy site (removed 3 days ago).  Labs reviewed. Renal function back to normal with hydration. Modest fall in hemoglobin to 8.1. In light of her cardiac history, pulmonary hypertension, lupus and need for chronic O2 at night and with exertion during the day, I have elected to transfuse the patient. The risks of the procedure have been reviewed with her.  Her pro time continues to climb and for that reason she'll be given 2 units of FFP as well.  No other source for her fever was identified and yesterday CT scan. There is no evidence of recurrent malrotation of the stomach to account for the patient's persistent nausea.  We'll plan for an upper endoscopy tomorrow to evaluate the gastric pouch to its above the diaphragm and see if any other intervention is required.

## 2015-03-28 ENCOUNTER — Observation Stay: Payer: Medicare Other | Admitting: Anesthesiology

## 2015-03-28 ENCOUNTER — Encounter
Admission: AD | Disposition: A | Discharge status: Home / Self Care | Payer: Self-pay | Source: Ambulatory Visit | Attending: General Surgery

## 2015-03-28 DIAGNOSIS — K208 Other esophagitis: Secondary | ICD-10-CM | POA: Diagnosis not present

## 2015-03-28 DIAGNOSIS — Z86718 Personal history of other venous thrombosis and embolism: Secondary | ICD-10-CM | POA: Diagnosis not present

## 2015-03-28 DIAGNOSIS — Z9981 Dependence on supplemental oxygen: Secondary | ICD-10-CM | POA: Diagnosis not present

## 2015-03-28 DIAGNOSIS — Z7901 Long term (current) use of anticoagulants: Secondary | ICD-10-CM | POA: Diagnosis not present

## 2015-03-28 DIAGNOSIS — E860 Dehydration: Secondary | ICD-10-CM | POA: Diagnosis present

## 2015-03-28 DIAGNOSIS — Z7951 Long term (current) use of inhaled steroids: Secondary | ICD-10-CM | POA: Diagnosis not present

## 2015-03-28 DIAGNOSIS — J9811 Atelectasis: Secondary | ICD-10-CM | POA: Diagnosis present

## 2015-03-28 DIAGNOSIS — E039 Hypothyroidism, unspecified: Secondary | ICD-10-CM | POA: Diagnosis present

## 2015-03-28 DIAGNOSIS — I270 Primary pulmonary hypertension: Secondary | ICD-10-CM | POA: Diagnosis present

## 2015-03-28 DIAGNOSIS — K2210 Ulcer of esophagus without bleeding: Secondary | ICD-10-CM | POA: Diagnosis present

## 2015-03-28 DIAGNOSIS — Z87891 Personal history of nicotine dependence: Secondary | ICD-10-CM | POA: Diagnosis not present

## 2015-03-28 DIAGNOSIS — E876 Hypokalemia: Secondary | ICD-10-CM | POA: Diagnosis present

## 2015-03-28 DIAGNOSIS — Z9889 Other specified postprocedural states: Secondary | ICD-10-CM | POA: Diagnosis not present

## 2015-03-28 DIAGNOSIS — N189 Chronic kidney disease, unspecified: Secondary | ICD-10-CM | POA: Diagnosis present

## 2015-03-28 DIAGNOSIS — M329 Systemic lupus erythematosus, unspecified: Secondary | ICD-10-CM | POA: Diagnosis present

## 2015-03-28 DIAGNOSIS — R791 Abnormal coagulation profile: Secondary | ICD-10-CM | POA: Diagnosis present

## 2015-03-28 DIAGNOSIS — K210 Gastro-esophageal reflux disease with esophagitis: Secondary | ICD-10-CM | POA: Diagnosis present

## 2015-03-28 DIAGNOSIS — K5790 Diverticulosis of intestine, part unspecified, without perforation or abscess without bleeding: Secondary | ICD-10-CM | POA: Diagnosis present

## 2015-03-28 DIAGNOSIS — K5900 Constipation, unspecified: Secondary | ICD-10-CM | POA: Diagnosis not present

## 2015-03-28 DIAGNOSIS — I517 Cardiomegaly: Secondary | ICD-10-CM | POA: Diagnosis not present

## 2015-03-28 DIAGNOSIS — Z452 Encounter for adjustment and management of vascular access device: Secondary | ICD-10-CM | POA: Diagnosis not present

## 2015-03-28 DIAGNOSIS — Z79899 Other long term (current) drug therapy: Secondary | ICD-10-CM | POA: Diagnosis not present

## 2015-03-28 DIAGNOSIS — K562 Volvulus: Secondary | ICD-10-CM | POA: Diagnosis not present

## 2015-03-28 DIAGNOSIS — Z880 Allergy status to penicillin: Secondary | ICD-10-CM | POA: Diagnosis not present

## 2015-03-28 DIAGNOSIS — R4702 Dysphasia: Secondary | ICD-10-CM | POA: Diagnosis present

## 2015-03-28 DIAGNOSIS — N179 Acute kidney failure, unspecified: Secondary | ICD-10-CM | POA: Diagnosis present

## 2015-03-28 DIAGNOSIS — R1110 Vomiting, unspecified: Secondary | ICD-10-CM | POA: Diagnosis not present

## 2015-03-28 DIAGNOSIS — K449 Diaphragmatic hernia without obstruction or gangrene: Secondary | ICD-10-CM | POA: Diagnosis present

## 2015-03-28 DIAGNOSIS — K209 Esophagitis, unspecified: Secondary | ICD-10-CM | POA: Diagnosis not present

## 2015-03-28 DIAGNOSIS — J189 Pneumonia, unspecified organism: Secondary | ICD-10-CM | POA: Diagnosis present

## 2015-03-28 DIAGNOSIS — K5730 Diverticulosis of large intestine without perforation or abscess without bleeding: Secondary | ICD-10-CM | POA: Diagnosis not present

## 2015-03-28 DIAGNOSIS — R1310 Dysphagia, unspecified: Secondary | ICD-10-CM | POA: Diagnosis not present

## 2015-03-28 DIAGNOSIS — I509 Heart failure, unspecified: Secondary | ICD-10-CM | POA: Diagnosis present

## 2015-03-28 DIAGNOSIS — M7121 Synovial cyst of popliteal space [Baker], right knee: Secondary | ICD-10-CM | POA: Diagnosis not present

## 2015-03-28 DIAGNOSIS — R112 Nausea with vomiting, unspecified: Secondary | ICD-10-CM | POA: Diagnosis not present

## 2015-03-28 DIAGNOSIS — Z79891 Long term (current) use of opiate analgesic: Secondary | ICD-10-CM | POA: Diagnosis not present

## 2015-03-28 DIAGNOSIS — R509 Fever, unspecified: Secondary | ICD-10-CM | POA: Diagnosis not present

## 2015-03-28 HISTORY — DX: Ulcer of esophagus without bleeding: K22.10

## 2015-03-28 HISTORY — PX: ESOPHAGOGASTRODUODENOSCOPY (EGD) WITH PROPOFOL: SHX5813

## 2015-03-28 LAB — PROTIME-INR
INR: 1.68
PROTHROMBIN TIME: 20 s — AB (ref 11.4–15.0)

## 2015-03-28 LAB — BASIC METABOLIC PANEL
ANION GAP: 10 (ref 5–15)
BUN: 13 mg/dL (ref 6–20)
CALCIUM: 8.4 mg/dL — AB (ref 8.9–10.3)
CHLORIDE: 106 mmol/L (ref 101–111)
CO2: 27 mmol/L (ref 22–32)
CREATININE: 1.01 mg/dL — AB (ref 0.44–1.00)
GFR calc non Af Amer: 52 mL/min — ABNORMAL LOW (ref >60)
Glucose, Bld: 99 mg/dL (ref 65–99)
Potassium: 3.4 mmol/L — ABNORMAL LOW (ref 3.5–5.1)
Sodium: 143 mmol/L (ref 135–145)

## 2015-03-28 LAB — HEMOGLOBIN AND HEMATOCRIT, BLOOD
HEMATOCRIT: 34.9 % — AB (ref 35.0–47.0)
Hemoglobin: 11.4 g/dL — ABNORMAL LOW (ref 12.0–16.0)

## 2015-03-28 LAB — PREPARE RBC (CROSSMATCH)

## 2015-03-28 SURGERY — ESOPHAGOGASTRODUODENOSCOPY (EGD) WITH PROPOFOL
Anesthesia: General

## 2015-03-28 MED ORDER — ENSURE ENLIVE PO LIQD
237.0000 mL | Freq: Two times a day (BID) | ORAL | Status: DC
  Administered 2015-03-29: 237 mL via ORAL

## 2015-03-28 MED ORDER — SUCRALFATE 1 GM/10ML PO SUSP
1.0000 g | Freq: Three times a day (TID) | ORAL | Status: DC
  Administered 2015-03-28 – 2015-04-02 (×18): 1 g via ORAL
  Filled 2015-03-28 (×21): qty 10

## 2015-03-28 MED ORDER — SODIUM CHLORIDE 0.9 % IV SOLN: 250.0000 mL | INTRAVENOUS | Status: DC | PRN

## 2015-03-28 MED ORDER — ENOXAPARIN SODIUM 40 MG/0.4ML ~~LOC~~ SOLN
40.0000 mg | SUBCUTANEOUS | Status: DC
  Administered 2015-03-28 – 2015-04-03 (×6): 40 mg via SUBCUTANEOUS
  Filled 2015-03-28 (×6): qty 0.4

## 2015-03-28 MED ORDER — PROPOFOL INFUSION 10 MG/ML OPTIME
INTRAVENOUS | Status: DC | PRN
  Administered 2015-03-28: 100 ug/kg/min via INTRAVENOUS

## 2015-03-28 MED ORDER — BOOST / RESOURCE BREEZE PO LIQD
1.0000 | Freq: Two times a day (BID) | ORAL | Status: DC
  Administered 2015-03-28: 1 via ORAL

## 2015-03-28 MED ORDER — PROPOFOL 10 MG/ML IV BOLUS
INTRAVENOUS | Status: DC | PRN
  Administered 2015-03-28: 50 mg via INTRAVENOUS

## 2015-03-28 MED ORDER — SODIUM CHLORIDE 0.9 % IJ SOLN
3.0000 mL | Freq: Two times a day (BID) | INTRAMUSCULAR | Status: DC
  Administered 2015-03-28 – 2015-04-02 (×7): 3 mL via INTRAVENOUS

## 2015-03-28 MED ORDER — SODIUM CHLORIDE 0.9 % IJ SOLN: 3.0000 mL | INTRAMUSCULAR | Status: DC | PRN

## 2015-03-28 NOTE — Op Note (Signed)
[facility] [address] [facility] Patient Name: Diana [name] Procedure Date: 03/28/2015 8:39 AM MRN: [id number] Account #: [id number] Date of Birth: 03-04-1935 Admit Type: Inpatient Age: 80 Room: [facility] ENDO ROOM 4 Gender: Female Note Status: Finalized Procedure:         Upper GI endoscopy Indications:       Postoperative assessment, Persistent vomiting of unknown                     cause Providers:         Earline Mayotte, MD Referring MD:      Earline Mayotte, MD (Referring MD), Duncan Dull, MD                     (Referring MD) Medicines:         Monitored Anesthesia Care Complications:     No immediate complications. Procedure:         Pre-Anesthesia Assessment:                    - Prior to the procedure, a History and Physical was                     performed, and patient medications, allergies and                     sensitivities were reviewed. The patient's tolerance of                     previous anesthesia was reviewed.                    - The risks and benefits of the procedure and the sedation                     options and risks were discussed with the patient. All                     questions were answered and informed consent was obtained.                    After obtaining informed consent, the endoscope was passed                     under direct vision. Throughout the procedure, the                     patient's blood pressure, pulse, and oxygen saturations                     were monitored continuously. The Endoscope was introduced                     through the mouth, and advanced to the second part of                     duodenum. The upper GI endoscopy was accomplished without                     difficulty. The patient tolerated the procedure well. Findings:      LA Grade C (one or more mucosal breaks continuous between tops of 2 or       more mucosal folds, less than 75% circumference) esophagitis with no       bleeding was  found.  A medium-sized hiatus hernia was present. Impression:        - LA Grade C reflux esophagitis.                    - Medium-sized hiatus hernia.                    - No specimens collected. Recommendation:    - Use sucralfate suspension 1 gram PO QID. Procedure Code(s): --- Professional ---                    [phone], Esophagogastroduodenoscopy, flexible, transoral;                     diagnostic, including collection of specimen(s) by                     brushing or washing, when performed (separate procedure) Diagnosis Code(s): --- Professional ---                    K21.0, Gastro-esophageal reflux disease with esophagitis                    K44.9, Diaphragmatic hernia without obstruction or gangrene                    Z09, Encounter for follow-up examination after completed                     treatment for conditions other than malignant neoplasm                    R11.10, Vomiting, unspecified CPT copyright 2014 American Medical Association. All rights reserved. The codes documented in this report are preliminary and upon coder review may  be revised to meet current compliance requirements. Earline Mayotte, MD 03/28/2015 9:50:38 AM This report has been signed electronically. Number of Addenda: 0 Note Initiated On: 03/28/2015 8:39 AM      [facility]

## 2015-03-28 NOTE — Progress Notes (Signed)
Tmax 99.5, VSS. Sat normal. Tolerated transfusion of PRBC and FFP w/o incident. Lungs: Clear. Cardio: RR. ABD: Soft. EGD this AM: Severe esophagitis from the upper/ mid junction to the GEJ. Stomach normal orientation w/ moderate sized hiatal hernia. Hiatal opening > 3 cm.  No spiral distortion. Pylorus patent. Area of prior G tube not visible. No duodenal ulceration. Plan: Diet, carafate. Low dose lovenox pending clinical progress. Hold coumadin until diet better established. Discussed w/ niece, Winifred Olive, RN.

## 2015-03-28 NOTE — Anesthesia Postprocedure Evaluation (Signed)
  Anesthesia Post-op Note  Patient: Diana [name]  Procedure(s) Performed: Procedure(s): ESOPHAGOGASTRODUODENOSCOPY (EGD) WITH PROPOFOL (N/A)  Anesthesia type:General  Patient location: PACU  Post pain: Pain level controlled  Post assessment: Post-op Vital signs reviewed, Patient's Cardiovascular Status Stable, Respiratory Function Stable, Patent Airway and No signs of Nausea or vomiting  Post vital signs: Reviewed and stable  Last Vitals:  Filed Vitals:   03/28/15 0900  BP: 189/69  Pulse: 94  Temp: 38.2 C  Resp: 18    Level of consciousness: awake, alert  and patient cooperative  Complications: No apparent anesthesia complications

## 2015-03-28 NOTE — Progress Notes (Signed)
Initial Nutrition Assessment  INTERVENTION:  Medical Food Supplement Therapy: Spoke with MD, Tamala Julian; agreeable to ordering Ensure Enlive as pt was drinking at home and was tolerating; will send BID. MD also agreeable for pt to try Boost Breeze supplement as it is not milky in taste.  Will continue to follow and make adjustments accordingly Ensure Enlive (each supplement provides 350kcal and 20 grams of protein), Boost Breeze (each supplement provides 250kcals and 9g protein)  NUTRITION DIAGNOSIS:  Inadequate oral intake related to altered GI function, acute illness as evidenced by per patient/family report.  GOAL:  Patient will meet greater than or equal to 90% of their needs  MONITOR:   (Energy Intake, Digestive system, Anthropometrics, Electrolyte and renal Profile)  REASON FOR ASSESSMENT:  Consult Assessment of nutrition requirement/status  ASSESSMENT:  Pt admitted with n/v, not tolerating po intake. Pt s/p EGD this am, per MD note severe esophagitis present with moderate hiatal hernia. Pt also with RLL pna per MD note. Pt s/p volvulus repair and incidental splenectomy a few weeks ago. Pt remains with Moss gastrostomy tube currently.  PMHx:  Past Medical History  Diagnosis Date  . Lupus (systemic lupus erythematosus)   . Pulmonary hypertension   . Pneumonia   . Unspecified menopausal and postmenopausal disorder   . Primary pulmonary hypertension   . Acute glomerulonephritis with other specified pathological lesion in kidney in disease classified elsewhere(580.81)   . Unspecified hypothyroidism   . Shingles (herpes zoster) polyneuropathy March 2013    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  . Hiatal hernia   . DVT (deep venous thrombosis)   . Cancer     Breast  . CHF (congestive heart failure)     Diet Order:  Diet full liquid Room service appropriate?: Yes; Fluid consistency:: Thin  Current Nutrition: Pt had just returned from procedure this am, had not had any po yet on  visit.   Food/Nutrition-Related History: Pt reports poor appetite PTA since last admission with n/v. Pt reports drinking Ensure PTA. Pt reports she has not been able to tolerate milky drinks last admission.   Medications: carafate, prednisone, vitamin B12  Electrolyte/Renal Profile and Glucose Profile:   Recent Labs Lab 03/24/15 1212 03/26/15 0837 03/27/15 0415 03/28/15 0759  NA 143  --  139 143  K 4.9  --  3.8 3.4*  CL 106  --  106 106  CO2 21  --  26 27  BUN 32*  --  19 13  CREATININE 1.28* 1.47* 1.01* 1.01*  CALCIUM 8.6*  --  7.9* 8.4*  GLUCOSE 126*  --  119* 99   Protein Profile: No results for input(s): ALBUMIN in the last 168 hours.  Gastrointestinal Profile: Last BM: 7/6   Nutrition-Focused Physical Exam Findings: Nutrition-Focused physical exam completed. Findings are WDL fat depletion, WDL muscle depletion however RD unable to assess lower extremities on visit today, and no edema.     Weight Change: Pt reports weight loss unsure of how much. Per [facility] 9% weight loss since end of May 2016 Anthropometrics:   Height:  Ht Readings from Last 1 Encounters:  03/26/15 5\' 2"  (1.575 m)    Weight:  Wt Readings from Last 1 Encounters:  03/28/15 140 lb 11.2 oz (63.821 kg)     Wt Readings from Last 10 Encounters:  03/28/15 140 lb 11.2 oz (63.821 kg)  03/24/15 138 lb (62.596 kg)  03/20/15 141 lb 1.6 oz (64.003 kg)  02/10/15 154 lb (69.854 kg)  02/05/15 154 lb (69.854 kg)  12/17/14 154 lb 1.9 oz (69.908 kg)  11/19/14 156 lb (70.761 kg)  11/13/14 155 lb 6 oz (70.478 kg)  11/10/14 157 lb 2 oz (71.271 kg)  06/10/14 153 lb (69.4 kg)    BMI:  Body mass index is 25.73 kg/(m^2).  Estimated Nutritional Needs:  Kcal:  1529-1807kcals, BEE: 1069kcals, TEE: (IF 1.1-1.3)(AF 1.3)   Protein:  64-76g protein (1.0-1.2g/kg)   Fluid:  1590-1985mL of fluid (25-72mL/kg)  Diet Order:  Diet full liquid Room service appropriate?: Yes; Fluid consistency:: Thin  EDUCATION  NEEDS:  No education needs identified at this time   [place], RD, LDN Pager [phone]

## 2015-03-28 NOTE — Transfer of Care (Signed)
Immediate Anesthesia Transfer of Care Note  Patient: Diana [name]  Procedure(s) Performed: Procedure(s): ESOPHAGOGASTRODUODENOSCOPY (EGD) WITH PROPOFOL (N/A)  Patient Location: PACU  Anesthesia Type:General  Level of Consciousness: awake, alert  and oriented  Airway & Oxygen Therapy: Patient Spontanous Breathing and Patient connected to nasal cannula oxygen  Post-op Assessment: Report given to RN  Post vital signs: Reviewed  Last Vitals:  Filed Vitals:   03/28/15 0900  BP: 189/69  Pulse: 94  Temp: 38.2 C  Resp: 18    Complications: No apparent anesthesia complications

## 2015-03-28 NOTE — Anesthesia Preprocedure Evaluation (Signed)
Anesthesia Evaluation  Patient identified by MRN, date of birth, ID band Patient awake    Reviewed: Allergy & Precautions, NPO status , Patient's Chart, lab work & pertinent test results, reviewed documented beta blocker date and time   Airway Mallampati: II  TM Distance: >3 FB     Dental  (+) Chipped, Partial Lower, Partial Upper   Pulmonary shortness of breath, pneumonia -, resolved, former smoker,          Cardiovascular + Peripheral Vascular Disease and +CHF     Neuro/Psych  Neuromuscular disease    GI/Hepatic hiatal hernia,   Endo/Other  diabetes, Type 2Hypothyroidism   Renal/GU Renal disease     Musculoskeletal   Abdominal   Peds  Hematology  (+) anemia ,   Anesthesia Other Findings sle.  Reproductive/Obstetrics                             Anesthesia Physical Anesthesia Plan  ASA: III  Anesthesia Plan: General   Post-op Pain Management:    Induction: Intravenous  Airway Management Planned: Nasal Cannula  Additional Equipment:   Intra-op Plan:   Post-operative Plan:   Informed Consent: I have reviewed the patients History and Physical, chart, labs and discussed the procedure including the risks, benefits and alternatives for the proposed anesthesia with the patient or authorized representative who has indicated his/her understanding and acceptance.     Plan Discussed with: CRNA  Anesthesia Plan Comments:         Anesthesia Quick Evaluation

## 2015-03-29 LAB — TYPE AND SCREEN
ABO/RH(D): O POS
Antibody Screen: NEGATIVE
UNIT DIVISION: 0
Unit division: 0

## 2015-03-29 LAB — PREPARE FRESH FROZEN PLASMA
UNIT DIVISION: 0
UNIT DIVISION: 0

## 2015-03-29 MED ORDER — FAMOTIDINE 20 MG PO TABS
20.0000 mg | ORAL_TABLET | Freq: Two times a day (BID) | ORAL | Status: DC
  Administered 2015-03-29 – 2015-03-31 (×4): 20 mg via ORAL
  Filled 2015-03-29 (×5): qty 1

## 2015-03-29 NOTE — Progress Notes (Signed)
  CONCERNING: IV to Oral Route Change Policy  RECOMMENDATION: This patient is receiving famotidine by the intravenous route.  Based on criteria approved by the Pharmacy and Therapeutics Committee, the intravenous medication(s) is/are being converted to the equivalent oral dose form(s).   DESCRIPTION: These criteria include:  The patient is eating (either orally or via tube) and/or has been taking other orally administered medications for a least 24 hours  The patient has no evidence of active gastrointestinal bleeding or impaired GI absorption (gastrectomy, short bowel, patient on TNA or NPO).  If you have questions about this conversion, please contact the Pharmacy Department  []   [phone] )  [facility] [x]   [phone] )  [facility] []   [phone] )  [facility] []   [phone] )  [facility] []   [phone] )  [place], [facility] 03/29/2015 12:06 PM

## 2015-03-29 NOTE — Progress Notes (Signed)
She has had temperature of 100.5 this morning. She reports last night she vomited. She does not think she can tolerate being in sure products. This morning she did eat a popsicle. She reports she is having some sinus drainage and some material collecting in the back of her throat. Was minimal coughing. She reports no dyspnea. She did not walk yesterday as she felt sleepy much of the day  after her endoscopy.  Medicines were reviewed. She does continue on Levaquin  Vital signs are stable. Highest pulse rate is 103. She is awake alert and oriented and in no acute distress. Lung sounds are clear. Dressing was removed. Her gastrostomy site is dry. The upper abdominal incision appears to be healing satisfactorily with no swelling or erythema. Steri-Strips remain intact. Abdomen is soft and nontender.  Impression his pulmonary infiltrate, hiatus hernia with reflux  Plan is to discontinue the  Ensure products as she does not tolerate them. Plan is to continue  clear liquids today. I recommended she spent some of her day in a chair in upright position. She does plan to walk some today. Continue to monitor her temperature and progress.

## 2015-03-30 ENCOUNTER — Other Ambulatory Visit: Payer: Self-pay | Admitting: General Surgery

## 2015-03-30 ENCOUNTER — Encounter: Payer: Self-pay | Admitting: General Surgery

## 2015-03-30 ENCOUNTER — Ambulatory Visit: Payer: Medicare Other | Attending: Radiation Oncology | Admitting: Radiation Oncology

## 2015-03-30 ENCOUNTER — Inpatient Hospital Stay: Payer: Medicare Other

## 2015-03-30 LAB — CBC WITH DIFFERENTIAL/PLATELET
BASOS ABS: 0.1 10*3/uL (ref 0–0.1)
BASOS PCT: 1 %
EOS PCT: 4 %
Eosinophils Absolute: 0.2 10*3/uL (ref 0–0.7)
HCT: 38.6 % (ref 35.0–47.0)
HEMOGLOBIN: 12.1 g/dL (ref 12.0–16.0)
Lymphocytes Relative: 19 %
Lymphs Abs: 1.1 10*3/uL (ref 1.0–3.6)
MCH: 28.6 pg (ref 26.0–34.0)
MCHC: 31.4 g/dL — ABNORMAL LOW (ref 32.0–36.0)
MCV: 91.3 fL (ref 80.0–100.0)
Monocytes Absolute: 0.4 10*3/uL (ref 0.2–0.9)
Monocytes Relative: 8 %
Neutro Abs: 4 10*3/uL (ref 1.4–6.5)
Neutrophils Relative %: 68 %
PLATELETS: 455 10*3/uL — AB (ref 150–440)
RBC: 4.23 MIL/uL (ref 3.80–5.20)
RDW: 15.7 % — ABNORMAL HIGH (ref 11.5–14.5)
WBC: 5.8 10*3/uL (ref 3.6–11.0)

## 2015-03-30 LAB — COMPREHENSIVE METABOLIC PANEL
ALK PHOS: 63 U/L (ref 38–126)
ALT: 16 U/L (ref 14–54)
AST: 26 U/L (ref 15–41)
Albumin: 2.8 g/dL — ABNORMAL LOW (ref 3.5–5.0)
Anion gap: 7 (ref 5–15)
BILIRUBIN TOTAL: 0.5 mg/dL (ref 0.3–1.2)
BUN: 17 mg/dL (ref 6–20)
CO2: 27 mmol/L (ref 22–32)
Calcium: 8.1 mg/dL — ABNORMAL LOW (ref 8.9–10.3)
Chloride: 107 mmol/L (ref 101–111)
Creatinine, Ser: 1.29 mg/dL — ABNORMAL HIGH (ref 0.44–1.00)
GFR calc Af Amer: 45 mL/min — ABNORMAL LOW (ref >60)
GFR, EST NON AFRICAN AMERICAN: 39 mL/min — AB (ref >60)
GLUCOSE: 145 mg/dL — AB (ref 65–99)
Potassium: 3 mmol/L — ABNORMAL LOW (ref 3.5–5.1)
SODIUM: 141 mmol/L (ref 135–145)
TOTAL PROTEIN: 5.9 g/dL — AB (ref 6.5–8.1)

## 2015-03-30 LAB — PROTIME-INR
INR: 1.55
Prothrombin Time: 18.8 seconds — ABNORMAL HIGH (ref 11.4–15.0)

## 2015-03-30 MED ORDER — KCL IN DEXTROSE-NACL 40-5-0.45 MEQ/L-%-% IV SOLN
INTRAVENOUS | Status: DC
  Administered 2015-03-30 – 2015-04-02 (×4): via INTRAVENOUS
  Filled 2015-03-30 (×9): qty 1000

## 2015-03-30 MED ORDER — POTASSIUM CHLORIDE 2 MEQ/ML IV SOLN: INTRAVENOUS | Status: DC

## 2015-03-30 NOTE — Care Management (Signed)
Important Message  Patient Details  Name: Diana [name] MRN: [id number] Date of Birth: [birth year]   Medicare Important Message Given:  Yes-second notification given    Juliann Pulse A Allmond 03/30/2015, 9:41 AM

## 2015-03-30 NOTE — Progress Notes (Addendum)
Afebrile since yesterday AM.  Reports the smell of food can nauseate. Pain w/ swallowing improved, but not totally abated w/ institution of carafate. No SOB/ CP. Scant, intermittent abdominal pain reported. LARGE BM this AM. Recorded PO intake from yesterday was dismal. Patient reports she is eating ice pops and drinking Sprite well.  EXAM: Weight 140# on this admission. Max 150 #, 148.5 today.  Lungs: Clear. Sats: 94-97% Cardio: RR ABD: Scaphoid, soft, non-tender. Faint erythema at former GT site, no fullness or tenderness. Extrem: Soft.  Multiple areas of ecchymosis. IMP: Poor po. IV H2 blocker changed to po by pharmacy in spite of intermittent vomiting and poor po under protocol. Prophylactic dosing of Lovenox in light of markedly elevated PT INR when coumadin restarted. No recent DVT. Plan: Will try diet as tolerated. If unable to maintain 1200 cc/ 24 hours and start some caloric intake, may need TPN. University assessment may be necessary if she fails to improve.    Labs reviewed: Normal white blood cell count and differential. Hemoglobin up to 12.1. Platelet count 455,000 consistent with previous splenectomy. INR 1.55. Albumen low at 2.8. Normal transaminases. Creatinine has crept back up from 1.0-1.29. Likely secondary to poor fluid volumes. Potassium down 3.0.  Plan: Institution D5 1 half normal saline with 40 mEq KCl per liter at 75 mL per hour pending her efforts at oral hydration.

## 2015-03-31 ENCOUNTER — Inpatient Hospital Stay: Payer: Medicare Other

## 2015-03-31 LAB — URINALYSIS COMPLETE WITH MICROSCOPIC (ARMC ONLY)
BILIRUBIN URINE: NEGATIVE
Bacteria, UA: NONE SEEN
Glucose, UA: NEGATIVE mg/dL
Hgb urine dipstick: NEGATIVE
Ketones, ur: NEGATIVE mg/dL
LEUKOCYTES UA: NEGATIVE
NITRITE: NEGATIVE
PH: 5 (ref 5.0–8.0)
Protein, ur: 30 mg/dL — AB
Specific Gravity, Urine: 1.025 (ref 1.005–1.030)

## 2015-03-31 MED ORDER — SODIUM CHLORIDE 0.9 % IV SOLN
INTRAVENOUS | Status: DC
Start: 2015-03-31 — End: 2015-03-31

## 2015-03-31 MED ORDER — PANTOPRAZOLE SODIUM 40 MG IV SOLR
40.0000 mg | Freq: Two times a day (BID) | INTRAVENOUS | Status: DC
  Administered 2015-04-01 – 2015-04-03 (×6): 40 mg via INTRAVENOUS
  Filled 2015-03-31 (×6): qty 40

## 2015-03-31 MED ORDER — LORATADINE 10 MG PO TABS
10.0000 mg | ORAL_TABLET | Freq: Every day | ORAL | Status: DC
  Administered 2015-03-31 – 2015-04-02 (×3): 10 mg via ORAL
  Filled 2015-03-31 (×4): qty 1

## 2015-03-31 NOTE — Progress Notes (Signed)
Patient has slept most the night, up once with diarrhea episode, up to bsc, iv fluids infusing without difficulty, temp increased 100.7, tylenol administered and temp decreased. She is pleasant, incision without drainage noted,no complaints of nausea this shift.

## 2015-03-31 NOTE — Progress Notes (Signed)
Nutrition Follow-up      INTERVENTION: Meals and snacks: Cater to pt preferences.  Nutrition Supplement Therapy: Pt unable to handle ensure enlive or boost breeze.  Willing to try magic cup BID for additional 290 kcals and 9 gm of protein per serving.   Coordination of care: Agree may need to consider nutrition support if no improvement in po intake.  NUTRITION DIAGNOSIS:  Inadequate oral intake related to altered GI function, acute illness as evidenced by per patient/family report, ongoing   GOAL:  Patient will meet greater than or equal to 90% of their needs  Currently not meeting nutritional goals   MONITOR:   (Energy Intake, Digestive system, Anthropometrics, Electrolyte and renal Profile)  REASON FOR ASSESSMENT:  Consult Assessment of nutrition requirement/status  ASSESSMENT: Noted EGD results   Current Nutrition: Pt reports ate bites of egg this am. Wanting to try rice, applesauce, magic cup and sprite for lunch today.  Reports nausea some better but food burns when going down esophagus which is impacting intake   Gastrointestinal Profile: GJ tube removed following discharge, pt reports abdominal pain during visit Last BM: 7/12   Medications: D5 1/2NS with KCL at 42ml/hr, carafate  Electrolyte/Renal Profile and Glucose Profile:   Recent Labs Lab 03/27/15 0415 03/28/15 0759 03/30/15 0847  NA 139 143 141  K 3.8 3.4* 3.0*  CL 106 106 107  CO2 26 27 27   BUN 19 13 17   CREATININE 1.01* 1.01* 1.29*  CALCIUM 7.9* 8.4* 8.1*  GLUCOSE 119* 99 145*   Protein Profile:  Recent Labs Lab 03/30/15 0847  ALBUMIN 2.8*     Nutrition-Focused Physical Exam Findings: Nutrition-Focused physical exam completed. Findings are WDL for fat depletion, muscle depletion, and edema.     Weight Trend since Admission: Filed Weights   03/29/15 0546 03/30/15 0318 03/31/15 0500  Weight: 150 lb 3.2 oz (68.13 kg) 148 lb 8 oz (67.359 kg) 149 lb 12.8 oz (67.949 kg)     Diet  Order:  Diet regular Room service appropriate?: Yes; Fluid consistency:: Thin   Height:  Ht Readings from Last 1 Encounters:  03/26/15 5\' 2"  (1.575 m)    Weight:  Wt Readings from Last 1 Encounters:  03/31/15 149 lb 12.8 oz (67.949 kg)    Noted wt change from 02/10/15 until present weight loss of 3% in last 2 months  Wt Readings from Last 10 Encounters:  03/31/15 149 lb 12.8 oz (67.949 kg)  03/24/15 138 lb (62.596 kg)  03/20/15 141 lb 1.6 oz (64.003 kg)  02/10/15 154 lb (69.854 kg)  02/05/15 154 lb (69.854 kg)  12/17/14 154 lb 1.9 oz (69.908 kg)  11/19/14 156 lb (70.761 kg)  11/13/14 155 lb 6 oz (70.478 kg)  11/10/14 157 lb 2 oz (71.271 kg)  06/10/14 153 lb (69.4 kg)    BMI:  Body mass index is 27.39 kg/(m^2).  Estimated Nutritional Needs:  Kcal:  1529-1807kcals, BEE: 1069kcals, TEE: (IF 1.1-1.3)(AF 1.3)   Protein:  64-76g protein (1.0-1.2g/kg)   Fluid:  1590-1943mL of fluid (25-29mL/kg)  EDUCATION NEEDS:  No education needs identified at this time  [place]. Zenia Resides, [place], [place] (pager)

## 2015-03-31 NOTE — Consult Note (Signed)
GI Inpatient Consult Note Lollie Sails MD  Reason for Consult: Odynophagia   Attending Requesting Consult: Dr. Lacie Scotts  Outpatient Primary Physician: Dr. Derrel Nip  History of Present Illness: Diana [name] is a 80 y.o. female readmitted to the hospital on 03/26/2015 with marked odynophagia. Patient had a surgery for gastric volvulus on 03/11/2015. The procedure included reduction of the volvulus gastropexy and incidental splenectomy. Prior to this occurrence states that she may have intermittently had problems with nausea and she had knowledge of having a large hiatal hernia being diagnosed several years ago. He had been taking proton pump inhibitor in the morning and ranitidine 150 mg at night for several years for reflux symptoms. She states that since her surgery she has not been able to eat solid foods. His however the nausea has been improving and her last emesis was actually several days ago. Currently she eats her swallowing discomfort is about a 4 out of 10. She states she is able to swallow foods down okay but she has a lot of pain with this.  It is of note that she has been taking CellCept for pulmonary Lupus for at least 3-4 years. No history of scleroderma.  She had an EGD on 03/28/2015 showing a severe reflux-related esophagitis review of that procedure and pictures show possibility also of a mild Candida as well. He is currently taking Carafate as well as Pepcid 20 mg twice a day..  Past Medical History:  Past Medical History  Diagnosis Date  . Lupus (systemic lupus erythematosus)   . Pulmonary hypertension   . Pneumonia   . Unspecified menopausal and postmenopausal disorder   . Primary pulmonary hypertension   . Acute glomerulonephritis with other specified pathological lesion in kidney in disease classified elsewhere(580.81)   . Unspecified hypothyroidism   . Shingles (herpes zoster) polyneuropathy March 2013    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  . Hiatal  hernia   . DVT (deep venous thrombosis)   . Cancer     Breast  . CHF (congestive heart failure)     Problem List: Patient Active Problem List   Diagnosis Date Noted  . Esophagitis 03/28/2015  . Pneumonia 03/26/2015  . Gastric volvulus 03/24/2015  . Postprocedural fever 03/24/2015  . Nausea with vomiting 03/24/2015  . Pulmonary hypertension 03/12/2015  . Volvulus of stomach 03/11/2015  . Flank pain 12/21/2014  . Constipation 12/21/2014  . Dyspnea 11/13/2014  . Herpes zoster 11/10/2014  . Chronic anticoagulation 11/10/2014  . Venous insufficiency of both lower extremities 06/13/2014  . Breast cancer of upper-outer quadrant of left female breast 05/26/2014  . Urinary incontinence 01/15/2014  . Bladder spasms 01/02/2014  . Other and unspecified hyperlipidemia 08/21/2013  . H/O recurrent pneumonia 05/26/2013  . Hypertensive pulmonary venous disease 04/23/2013  . Diastolic dysfunction with heart failure 04/23/2013  . Long term current use of anticoagulant therapy 03/27/2013  . DVT, recurrent, lower extremity, chronic 01/07/2013  . Hiatal hernia 01/06/2013  . Renal mass, left 01/06/2013  . Sciatica of right side 11/20/2012  . Post herpetic neuralgia 11/20/2012  . Disseminated zoster 10/31/2012  . Insomnia 11/08/2011  . LUPUS ERYTHEMATOSUS 06/13/2008  . NEUROPATHY-PERIPHERAL 10/06/2006  . Hypothyroidism 09/25/2006  . ANEMIA, B12 DEFICIENCY 09/25/2006  . GERD 09/25/2006  . Systemic lupus erythematosus 09/25/2006  . Diabetes mellitus without complication [id number]  . SHINGLES, HX OF 09/25/2006  . TOBACCO ABUSE, HX OF 09/25/2006    Past Surgical History: Past Surgical History  Procedure Laterality Date  . Kyphoplasty N/A  02/10/2015    Procedure: [id number];  Surgeon: Hessie Knows, MD;  Location: [facility] ORS;  Service: Orthopedics;  Laterality: N/A;  . Laparotomy N/A 03/11/2015    Procedure: REDUCTION OF GASTRIC VOLVULUS, SPLEENECTOMY, GASTRIC TUBE PLACEMENT;  Surgeon:  Robert Bellow, MD;  Location: [facility] ORS;  Service: General;  Laterality: N/A;  . Esophagogastroduodenoscopy (egd) with propofol N/A 03/28/2015    Procedure: ESOPHAGOGASTRODUODENOSCOPY (EGD) WITH PROPOFOL;  Surgeon: Robert Bellow, MD;  Location: [facility] ENDOSCOPY;  Service: Endoscopy;  Laterality: N/A;    Allergies: Allergies  Allergen Reactions  . Amoxicillin Other (See Comments)    Reaction:  Stomach cramps   . Cefuroxime Itching  . Contrast Media [Iodinated Diagnostic Agents] Hives  . Cephalexin Rash  . Sulfonamide Derivatives Rash  . Tramadol Hcl Rash  . Venlafaxine Rash    Home Medications: Prescriptions prior to admission  Medication Sig Dispense Refill Last Dose  . ALPRAZolam (XANAX) 0.5 MG tablet Take 0.5 mg by mouth at bedtime as needed for anxiety.   03/26/2015 at 6:30  . budesonide-formoterol (SYMBICORT) 160-4.5 MCG/ACT inhaler Inhale 2 puffs into the lungs 2 (two) times daily.   03/26/2015 at 6:30  . Calcium Carbonate-Vitamin D (CALCIUM + D PO) Take 1 tablet by mouth daily.   Taking  . cyanocobalamin (,VITAMIN B-12,) 1000 MCG/ML injection Inject 1 mL (1,000 mcg total) into the muscle every 30 (thirty) days. 10 mL 1 Taking  . diphenhydrAMINE (BENADRYL) 50 MG tablet Take 1 tablet (50 mg total) by mouth once. Take 50 mg Benadryl 1 hour prior to CT scan 1 tablet 0 03/26/2015 at 9:15  . enoxaparin (LOVENOX) 60 MG/0.6ML injection Inject 0.6 mLs (60 mg total) into the skin every 12 (twelve) hours. 4 Syringe 0 Taking  . fluticasone (FLONASE) 50 MCG/ACT nasal spray Place 2 sprays into both nostrils daily as needed for rhinitis.   03/25/2015  . furosemide (LASIX) 20 MG tablet Take 20 mg by mouth daily.   03/19/2015  . HYDROcodone-acetaminophen (NORCO/VICODIN) 5-325 MG per tablet Take 1 tablet by mouth every 6 (six) hours as needed for moderate pain. 90 tablet 0 03/23/2015  . HYDROcodone-acetaminophen (NORCO/VICODIN) 5-325 MG per tablet Take 1-2 tablets by mouth every 4 (four) hours as needed for  moderate pain. 30 tablet 0 Taking  . hydroxychloroquine (PLAQUENIL) 200 MG tablet Take 200 mg by mouth daily.   03/25/2015  . letrozole (FEMARA) 2.5 MG tablet Take 2.5 mg by mouth daily.   03/26/2015 at 6:30  . LORazepam (ATIVAN) 2 MG/ML injection Inject 0.25 mLs (0.5 mg total) into the vein every 6 (six) hours as needed for anxiety. 1 mL 0 Taking  . metoCLOPramide (REGLAN) 5 MG tablet Take 1 tablet (5 mg total) by mouth 4 (four) times daily -  before meals and at bedtime. 40 tablet 0 03/25/2015  . metoprolol succinate (TOPROL-XL) 25 MG 24 hr tablet Take 25 mg by mouth daily.   03/26/2015 at 6:30  . mycophenolate (CELLCEPT) 500 MG tablet Take 1,000 mg by mouth 2 (two) times daily.    03/26/2015 at 6:30  . omeprazole (PRILOSEC) 40 MG capsule Take 40 mg by mouth daily.   03/26/2015 at 6:30  . ondansetron (ZOFRAN-ODT) 4 MG disintegrating tablet Take 1 tablet (4 mg total) by mouth every 4 (four) hours as needed for nausea or vomiting. 20 tablet 0 03/25/2015  . predniSONE (DELTASONE) 5 MG tablet Take 5 mg by mouth daily.   Taking  . predniSONE (DELTASONE) 50 MG tablet Take 1 tablet 13  hours, 7 hours, and 1 hour prior to CT scan 3 tablet 0 03/26/2015 at 9:15  . ranitidine (ZANTAC) 150 MG tablet Take 1 tablet (150 mg total) by mouth at bedtime. 90 tablet 2 03/25/2015  . SYNTHROID 150 MCG tablet Take 1 tablet (150 mcg total) by mouth daily. 30 tablet 11 03/26/2015 at 6:30  . Tadalafil, PAH, (ADCIRCA) 20 MG TABS Take 40 mg by mouth daily.   03/26/2015 at 6:30  . warfarin (COUMADIN) 5 MG tablet Take 1 tablet (5 mg total) by mouth daily. 30 tablet 3 03/25/2015 at 1700   Home medication reconciliation was completed with the patient.   Scheduled Inpatient Medications:   . budesonide-formoterol  2 puff Inhalation BID  . cyanocobalamin  1,000 mcg Intramuscular Q30 days  . enoxaparin (LOVENOX) injection  40 mg Subcutaneous Q24H  . famotidine  20 mg Oral BID  . hydroxychloroquine  200 mg Oral Daily  . letrozole  2.5 mg Oral Daily   . levofloxacin (LEVAQUIN) IV  250 mg Intravenous Q24H  . levothyroxine  150 mcg Oral QAC breakfast  . loratadine  10 mg Oral Daily  . metoprolol succinate  25 mg Oral Daily  . mycophenolate  1,000 mg Oral BID  . pneumococcal 23 valent vaccine  0.5 mL Intramuscular Tomorrow-1000  . predniSONE  5 mg Oral Daily  . sodium chloride  3 mL Intravenous Q12H  . sucralfate  1 g Oral TID WC & HS  . Tadalafil (PAH)  40 mg Oral Daily    Continuous Inpatient Infusions:   . dextrose 5 % and 0.45 % NaCl with KCl 40 mEq/L 75 mL/hr at 03/31/15 1855    PRN Inpatient Medications:  sodium chloride, acetaminophen **OR** acetaminophen, ALPRAZolam, fluticasone, HYDROcodone-acetaminophen, morphine injection, ondansetron **OR** ondansetron (ZOFRAN) IV, promethazine, sodium chloride  Family History: family history is not on file.   GI Family History: Noncontributory  Social History:   reports that she quit smoking about 23 years ago. She has never used smokeless tobacco. She reports that she drinks alcohol. She reports that she does not use illicit drugs. The patient denies ETOH, tobacco, or drug use.   ROS  Review of Systems:   Physical Examination: BP 131/56 mmHg  Pulse 115  Temp(Src) 98.6 F (37 C) (Oral)  Resp 20  Ht 5\' 2"  (1.575 m)  Wt 67.949 kg (149 lb 12.8 oz)  BMI 27.39 kg/m2  SpO2 1% Gen: 80 year old female no acute distress HEENT: Normocephalic atraumatic eyes are anicteric Neck: Supple no JVD Chest: Clear to auscultation CV: Regular rate and rhythm without rub or gallop Abd: Soft bowel sounds are positive, healing midline incision in the upper abdomen. There is no discomfort. Ext: No clubbing cyanosis or edema Skin: Other:  Data: Lab Results  Component Value Date   WBC 5.8 03/30/2015   HGB 12.1 03/30/2015   HCT 38.6 03/30/2015   MCV 91.3 03/30/2015   PLT 455* 03/30/2015    Recent Labs Lab 03/27/15 0415 03/28/15 0759 03/30/15 0847  HGB 8.1* 11.4* 12.1   Lab  Results  Component Value Date   NA 141 03/30/2015   K 3.0* 03/30/2015   CL 107 03/30/2015   CO2 27 03/30/2015   BUN 17 03/30/2015   CREATININE 1.29* 03/30/2015   Lab Results  Component Value Date   ALT 16 03/30/2015   AST 26 03/30/2015   ALKPHOS 63 03/30/2015   BILITOT 0.5 03/30/2015    Recent Labs Lab 03/30/15 0847  INR 1.55   CBC  Latest Ref Rng 03/30/2015 03/28/2015 03/27/2015  WBC 3.6 - 11.0 K/uL 5.8 - 4.7  Hemoglobin 12.0 - 16.0 g/dL 12.1 11.4(L) 8.1(L)  Hematocrit 35.0 - 47.0 % 38.6 34.9(L) 25.1(L)  Platelets 150 - 440 K/uL 455(H) - 446(H)    STUDIES: US Venous Img Lower Bilateral  03/31/2015   CLINICAL DATA:  Fever, history of malignancy, prior DVT, varicose veins.  EXAM: BILATERAL LOWER EXTREMITY VENOUS DOPPLER ULTRASOUND  TECHNIQUE: Gray-scale sonography with graded compression, as well as color Doppler and duplex ultrasound were performed to evaluate the lower extremity deep venous systems from the level of the common femoral vein and including the common femoral, femoral, profunda femoral, popliteal and calf veins including the posterior tibial, peroneal and gastrocnemius veins when visible. The superficial great saphenous vein was also interrogated. Spectral Doppler was utilized to evaluate flow at rest and with distal augmentation maneuvers in the common femoral, femoral and popliteal veins.  COMPARISON:  06/08/2014  FINDINGS: RIGHT LOWER EXTREMITY  Common Femoral Vein: No evidence of thrombus. Normal compressibility, respiratory phasicity and response to augmentation.  Saphenofemoral Junction: No evidence of thrombus. Normal compressibility and flow on color Doppler imaging.  Profunda Femoral Vein: No evidence of thrombus. Normal compressibility and flow on color Doppler imaging.  Femoral Vein: No evidence of thrombus. Normal compressibility, respiratory phasicity and response to augmentation.  Popliteal Vein: No evidence of thrombus. Normal compressibility, respiratory  phasicity and response to augmentation.  Calf Veins: No evidence of thrombus. Normal compressibility and flow on color Doppler imaging.  Superficial Great Saphenous Vein: No evidence of thrombus. Normal compressibility and flow on color Doppler imaging.  Venous Reflux:  None.  Other Findings: Small right popliteal fossa minimally complex cystic area measures 1.6 x 0.8 x 2.2 cm compatible with an incidental Baker cyst.  LEFT LOWER EXTREMITY  Common Femoral Vein: No evidence of thrombus. Normal compressibility, respiratory phasicity and response to augmentation.  Saphenofemoral Junction: No evidence of thrombus. Normal compressibility and flow on color Doppler imaging.  Profunda Femoral Vein: No evidence of thrombus. Normal compressibility and flow on color Doppler imaging.  Femoral Vein: No evidence of thrombus. Normal compressibility, respiratory phasicity and response to augmentation.  Popliteal Vein: No evidence of thrombus. Normal compressibility, respiratory phasicity and response to augmentation.  Calf Veins: No evidence of thrombus. Normal compressibility and flow on color Doppler imaging.  Superficial Great Saphenous Vein: No evidence of thrombus. Normal compressibility and flow on color Doppler imaging.  Venous Reflux:  None.  Other Findings:  None.  IMPRESSION: No evidence of deep venous thrombosis.  Small right popliteal fossa Baker cyst.   Electronically Signed   By: Jerilynn Mages.  Shick M.D.   On: 03/31/2015 19:19   Dg Chest Port 1 View  03/31/2015   CLINICAL DATA:  PICC placement  EXAM: PORTABLE CHEST - 1 VIEW  COMPARISON:  03/26/2015  FINDINGS: Borderline heart size with normal pulmonary vascularity. Linear atelectasis in the lung bases. Hiatal hernia behind the heart. No pneumothorax. Right PICC catheter is been placed since prior study with tip overlying the lower SVC region. No pneumothorax.  IMPRESSION: Right PICC catheter appears to be in good placement. Mild cardiac enlargement. Linear atelectasis in the  lung bases.   Electronically Signed   By: Lucienne Capers M.D.   On: 03/31/2015 21:29   Dg Abd 2 Views  03/30/2015   CLINICAL DATA:  Abdominal pain and constipation  EXAM: ABDOMEN - 2 VIEW  COMPARISON:  CT abdomen and pelvis March 26, 2015  FINDINGS:  Supine and upright images obtained. There is mild to moderate stool in the colon. There is contrast filling diverticula throughout most of the colon without obvious inflammation. No bowel obstruction or free air is seen. The patient has had kyphoplasty procedures at L1 and L2. There is marked thoracolumbar dextroscoliosis.  IMPRESSION: Extensive colonic diverticulosis. Mild to moderate stool seen in colon. No obstruction or free air demonstrated.   Electronically Signed   By: Lowella Grip III M.D.   On: 03/30/2015 09:48     Assessment: 1. She with acute gastric volvulus in the setting of known history of large hiatal hernia for several years. Patient had been taking a proton pump inhibitor daily for many years in addition to stool receptor antagonist in the evening. He is currently not on a proton pump inhibitor and is quite likely she is having some amount of rebound acid production. Review of EGD with Dr. Tollie Pizza shows a severe esophagitis as well as small mild Candida. He would be more prone to the latter to her taking CellCept in addition to likely previous stasis in the esophagus due to the large hiatal hernia.  Recommendations: 1. Continue Carafate suspension 4 times a day 2. Would place her on a IV PPI (protonix) for a couple of days then change to rabeprazole 20 mg by mouth twice a day. 3. Would consider a short course of Diflucan perhaps 5 days of 100 mg daily.  Will follow with you  Thank you for the consult. Please call with questions or concerns.  Lollie Sails, MD  03/31/2015 11:02 PM

## 2015-03-31 NOTE — Progress Notes (Signed)
Tmax 100.7 last PM, down since after one dose of tylenol. ? Related to SLE. Reports retrosternal pain and burning sensation after/ during eating. Some epigastric pain. Two loose stools in the last 24 hours. BP running slightly low with systolic BP in the [id number]. I&O from yesterday: None recorded. Patient reports she drank a 16 ox milk shake, 8 oz of cranberry juice and 8+ ounces of Sprite.  Minimal solids. Still troubled with nausea. Lungs: Clear. Cardio: RR. ABD: Good BS. Minimal epigastric tenderness. Extrem: Soft. Little progress on any caloric intake. Will get GI to see for any thoughts on persistent nausea.  May require university assessment.

## 2015-03-31 NOTE — Progress Notes (Signed)
Multiple IVF orders. MD called and ordered to D/C NS.

## 2015-03-31 NOTE — Plan of Care (Signed)
Problem: Phase I Progression Outcomes Goal: Initial discharge plan identified Outcome: Progressing Progress to Goal: patient without nausea this shift. Temp increased to 100.7, tylenol administered. Patient sit in chair until bedtime, has one loose stool.

## 2015-03-31 NOTE — Consult Note (Signed)
[name] - [place] at [facility] Internal Medicine Consultation   PATIENT NAME: Diana [name]    MR#:  [id number]  DATE OF BIRTH:  12-15-1934  DATE OF ADMISSION:  03/26/2015  PRIMARY CARE PHYSICIAN: Diana Shams, MD   REQUESTING/REFERRING PHYSICIAN: Dr Diana [name]  CHIEF COMPLAINT:  Fever  HISTORY OF PRESENT ILLNESS:  Diana [name]  is a 80 y.o. female with a known history of systemic lupus with pulmonary involvement, primary pulmonary hypertension, left leg DVT in 2014 for which she has been on Coumadin who has had 2 admissions in the past month. She was initially hospitalized on June 22 for acute abdominal pain attributed to gastric and small intestine volvulus. She had exploratory laparotomy and reduction of volvulus with splenectomy and gastropexy. She was discharged on July 1. She followed up July 5 for removal of sutures and removal of gastrostomy tube. At that time she reported feeling poorly with decreased oral intakeas well as fever. Her lab work indicated she was dehydrated. She was admitted at that time. CT scan of the abdomen pelvis revealed a right lower lobe pneumonia, but no abdominal source of fevers or malaise. She has been treated with Levaquin for 5 days and continues to have fevers, MAXIMUM TEMPERATURE overnight is 100.7. Medicine service has been consult and to further evaluate for source of fever. She reports having a cough productive of yellow sputum on admission which is now fairly clear and decreased in quantity. She denies dysuria or frequency. She denies abdominal pain. She does have nasal and sinus congestion and would like to be restarted on her daily anti-histamine. Denies sore throat. She does have diarrhea with one or 2 very large watery bowel movements daily, no hematochezia or melanoma. No vomiting. No skin lesions or pressure ulcers. She has some right knee tenderness, but does not feel that this is typical of a lupus flare.  PAST  MEDICAL HISTORY:   Past Medical History  Diagnosis Date  . Lupus (systemic lupus erythematosus)   . Pulmonary hypertension   . Pneumonia   . Unspecified menopausal and postmenopausal disorder   . Primary pulmonary hypertension   . Acute glomerulonephritis with other specified pathological lesion in kidney in disease classified elsewhere(580.81)   . Unspecified hypothyroidism   . Shingles (herpes zoster) polyneuropathy March 2013    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  . Hiatal hernia   . DVT (deep venous thrombosis)   . Cancer     Breast  . CHF (congestive heart failure)     PAST SURGICAL HISTORY:   Past Surgical History  Procedure Laterality Date  . Kyphoplasty N/A 02/10/2015    Procedure: [id number];  Surgeon: Kennedy Bucker, MD;  Location: [facility] ORS;  Service: Orthopedics;  Laterality: N/A;  . Laparotomy N/A 03/11/2015    Procedure: REDUCTION OF GASTRIC VOLVULUS, SPLEENECTOMY, GASTRIC TUBE PLACEMENT;  Surgeon: Earline Mayotte, MD;  Location: [facility] ORS;  Service: General;  Laterality: N/A;  . Esophagogastroduodenoscopy (egd) with propofol N/A 03/28/2015    Procedure: ESOPHAGOGASTRODUODENOSCOPY (EGD) WITH PROPOFOL;  Surgeon: Earline Mayotte, MD;  Location: [facility] ENDOSCOPY;  Service: Endoscopy;  Laterality: N/A;    SOCIAL HISTORY:   History  Substance Use Topics  . Smoking status: Former Smoker    Quit date: 05/25/1991  . Smokeless tobacco: Never Used  . Alcohol Use: Yes     Comment: rare    FAMILY HISTORY:  History reviewed. No pertinent family history.  DRUG ALLERGIES:   Allergies  Allergen Reactions  .  Amoxicillin Other (See Comments)    Reaction:  Stomach cramps   . Cefuroxime Itching  . Contrast Media [Iodinated Diagnostic Agents] Hives  . Cephalexin Rash  . Sulfonamide Derivatives Rash  . Tramadol Hcl Rash  . Venlafaxine Rash    REVIEW OF SYSTEMS:   Review of Systems  Constitutional: Positive for fever and diaphoresis. Negative for chills, weight loss  and malaise/fatigue.  HENT: Positive for congestion. Negative for ear discharge, ear pain, hearing loss, nosebleeds, sore throat and tinnitus.   Eyes: Negative for blurred vision and pain.  Respiratory: Positive for cough and sputum production. Negative for hemoptysis, shortness of breath, wheezing and stridor.   Cardiovascular: Negative for chest pain, palpitations, orthopnea and leg swelling.  Gastrointestinal: Positive for diarrhea. Negative for nausea, vomiting, abdominal pain, constipation and blood in stool.  Genitourinary: Negative for dysuria and frequency.  Musculoskeletal: Negative for myalgias, back pain, joint pain and neck pain.  Skin: Negative for itching and rash.  Neurological: Positive for weakness and headaches. Negative for focal weakness and loss of consciousness.  Endo/Heme/Allergies: Does not bruise/bleed easily.  Psychiatric/Behavioral: Negative for depression and hallucinations. The patient is not nervous/anxious.     MEDICATIONS AT HOME:   Prior to Admission medications   Medication Sig Start Date End Date Taking? Authorizing Provider  ALPRAZolam Prudy Feeler) 0.5 MG tablet Take 0.5 mg by mouth at bedtime as needed for anxiety.    Historical Provider, MD  budesonide-formoterol (SYMBICORT) 160-4.5 MCG/ACT inhaler Inhale 2 puffs into the lungs 2 (two) times daily.    Historical Provider, MD  Calcium Carbonate-Vitamin D (CALCIUM + D PO) Take 1 tablet by mouth daily.    Historical Provider, MD  cyanocobalamin (,VITAMIN B-12,) 1000 MCG/ML injection Inject 1 mL (1,000 mcg total) into the muscle every 30 (thirty) days. 11/08/11   Diana Shams, MD  diphenhydrAMINE (BENADRYL) 50 MG tablet Take 1 tablet (50 mg total) by mouth once. Take 50 mg Benadryl 1 hour prior to CT scan 03/24/15   Earline Mayotte, MD  enoxaparin (LOVENOX) 60 MG/0.6ML injection Inject 0.6 mLs (60 mg total) into the skin every 12 (twelve) hours. 03/20/15   Earline Mayotte, MD  fluticasone (FLONASE) 50 MCG/ACT  nasal spray Place 2 sprays into both nostrils daily as needed for rhinitis.    Historical Provider, MD  furosemide (LASIX) 20 MG tablet Take 20 mg by mouth daily.    Historical Provider, MD  HYDROcodone-acetaminophen (NORCO/VICODIN) 5-325 MG per tablet Take 1 tablet by mouth every 6 (six) hours as needed for moderate pain. 06/10/14   Diana Shams, MD  HYDROcodone-acetaminophen (NORCO/VICODIN) 5-325 MG per tablet Take 1-2 tablets by mouth every 4 (four) hours as needed for moderate pain. 03/20/15   Earline Mayotte, MD  hydroxychloroquine (PLAQUENIL) 200 MG tablet Take 200 mg by mouth daily.    Historical Provider, MD  letrozole (FEMARA) 2.5 MG tablet Take 2.5 mg by mouth daily.    Historical Provider, MD  LORazepam (ATIVAN) 2 MG/ML injection Inject 0.25 mLs (0.5 mg total) into the vein every 6 (six) hours as needed for anxiety. 03/20/15   Earline Mayotte, MD  metoCLOPramide (REGLAN) 5 MG tablet Take 1 tablet (5 mg total) by mouth 4 (four) times daily -  before meals and at bedtime. 03/20/15   Earline Mayotte, MD  metoprolol succinate (TOPROL-XL) 25 MG 24 hr tablet Take 25 mg by mouth daily.    Historical Provider, MD  mycophenolate (CELLCEPT) 500 MG tablet Take 1,000 mg  by mouth 2 (two) times daily.     Historical Provider, MD  omeprazole (PRILOSEC) 40 MG capsule Take 40 mg by mouth daily.    Historical Provider, MD  ondansetron (ZOFRAN-ODT) 4 MG disintegrating tablet Take 1 tablet (4 mg total) by mouth every 4 (four) hours as needed for nausea or vomiting. 03/20/15   Earline Mayotte, MD  predniSONE (DELTASONE) 5 MG tablet Take 5 mg by mouth daily.    Historical Provider, MD  predniSONE (DELTASONE) 50 MG tablet Take 1 tablet 13 hours, 7 hours, and 1 hour prior to CT scan 03/24/15   Earline Mayotte, MD  ranitidine (ZANTAC) 150 MG tablet Take 1 tablet (150 mg total) by mouth at bedtime. 11/19/14   Diana Shams, MD  SYNTHROID 150 MCG tablet Take 1 tablet (150 mcg total) by mouth daily. 01/12/15   Diana Shams, MD  Tadalafil, PAH, (ADCIRCA) 20 MG TABS Take 40 mg by mouth daily.    Historical Provider, MD  warfarin (COUMADIN) 5 MG tablet Take 1 tablet (5 mg total) by mouth daily. 09/08/14   Diana Shams, MD      VITAL SIGNS:  Blood pressure 106/60, pulse 76, temperature 98.9 F (37.2 C), temperature source Oral, resp. rate 17, height 5\' 2"  (1.575 m), weight 67.949 kg (149 lb 12.8 oz), SpO2 96 %.  PHYSICAL EXAMINATION:  GENERAL:  80 y.o.-year-old patient lying in the bed with no acute distress. Weak appearing EYES: Pupils equal, round, reactive to light and accommodation. No scleral icterus. Extraocular muscles intact.  HEENT: Head atraumatic, normocephalic. Oropharynx and nasopharynx clear. Mucous membranes are dry NECK:  Supple, no jugular venous distention. No thyroid enlargement, no tenderness.  LUNGS: Normal breath sounds bilaterally, no wheezing, rales, rhonchi or crepitation. No use of accessory muscles of respiration.  CARDIOVASCULAR: S1, S2 normal. No murmurs, rubs, or gallops.  ABDOMEN: Soft, minimally tender, nondistended. Bowel sounds present. No organomegaly or mass. No guarding or rebound. Midline scar healing well with no erythema or drainage. There is also a healing area in the left upper quadrant from gastrostomy tube which is also healing well. EXTREMITIES: No pedal edema, cyanosis, or clubbing. Referral pulses 2+ NEUROLOGIC: Cranial nerves II through XII are intact. Muscle strength 5/5 in all extremities. Sensation intact. Gait not checked.  PSYCHIATRIC: The patient is alert and oriented x 3. Calm SKIN: No obvious rash, lesion, or ulcer.   LABORATORY PANEL:   CBC  Recent Labs Lab 03/30/15 0847  WBC 5.8  HGB 12.1  HCT 38.6  PLT 455*   ------------------------------------------------------------------------------------------------------------------  Chemistries   Recent Labs Lab 03/30/15 0847  NA 141  K 3.0*  CL 107  CO2 27  GLUCOSE 145*  BUN 17   CREATININE 1.29*  CALCIUM 8.1*  AST 26  ALT 16  ALKPHOS 63  BILITOT 0.5   ------------------------------------------------------------------------------------------------------------------  Cardiac Enzymes No results for input(s): TROPONINI in the last 168 hours. ------------------------------------------------------------------------------------------------------------------  RADIOLOGY:  Dg Abd 2 Views  03/30/2015   CLINICAL DATA:  Abdominal pain and constipation  EXAM: ABDOMEN - 2 VIEW  COMPARISON:  CT abdomen and pelvis March 26, 2015  FINDINGS: Supine and upright images obtained. There is mild to moderate stool in the colon. There is contrast filling diverticula throughout most of the colon without obvious inflammation. No bowel obstruction or free air is seen. The patient has had kyphoplasty procedures at L1 and L2. There is marked thoracolumbar dextroscoliosis.  IMPRESSION: Extensive colonic diverticulosis. Mild to moderate stool seen  in colon. No obstruction or free air demonstrated.   Electronically Signed   By: Bretta Bang III M.D.   On: 03/30/2015 09:48    EKG:   Orders placed or performed during the hospital encounter of 03/10/15  . ED EKG (<79mins upon arrival to the ED)  . ED EKG (<4mins upon arrival to the ED)  . EKG 12-Lead  . EKG 12-Lead  . EKG    IMPRESSION AND PLAN:   Problem #1 fever: Differential is broad and includes - Infection: Will check urinalysis, blood cultures, sputum culture. stool cultures and Clostridium difficile. If none of the studies performed over the next 24 hours are revealing would also get a 2-D echocardiogram to rule out endocarditis. At this point we'll hold off on broadening anti-biotics as I do not have a source. - Thrombosis: She is at high risk even on Lovenox with history of thrombotic event, autoimmune disorder, recent surgery, pulmonary hypertension. Willl get lower extremity Dopplers. Her most recent creatinine is slightly  elevated so will hydrate and possibly get CT angiography of the chest tomorrow, could include abdomen as the prior study showed possible bowel wall thickening in the colon. - Autoimmune flare: She does have right knee pain without effusion warmth or erythema. Continue current lupus medications at this time. Check ESR and double stranded DNA - Atelectasis: Start incentive spirometry - Medications: I have reviewed her medication list and do not see any potential sources of fever. Will review with pharmacy.  Problem #2 acute on chronic kidney disease: She needs continued hydration as she is not taking in orally. We'll place a PICC line to facilitate hydration. Monitor renal function. When renal function is stable will plan for CT angiography to rule out PE  Problem #3 hypothyroidism: Check a TSH as hyperthyroidism can cause fever  Problem #4 hypokalemia: Recheck potassium and magnesium. Replace as needed  Problem #5: Healthcare acquired pneumonia: Bibasilar opacities seen in CT abdomen July 7. She has now received 5 days of Levaquin and is still spiking fevers. Will obtain a sputum culture. Hopefully can reimage with CT in the morning. For now we'll hold off on broadening anti-biotics until above-mentioned tests have been performed.  All the records are reviewed and case discussed with ED provider. Management plans discussed with the patient, family and they are in agreement.  CODE STATUS: Full  TOTAL TIME TAKING CARE OF THIS PATIENT: 50 minutes.  Greater than 50% of time spent in coordination of care and counseling.  Elby Showers M.D on 03/31/2015 at 3:21 PM  Between 7am to 6pm - Pager - [phone]  After 6pm go to [email or web address] - password [email or web address] [facility]  [place] [facility]  Office  [phone]  CC: Primary care physician; Diana Shams, MD

## 2015-04-01 LAB — BASIC METABOLIC PANEL
Anion gap: 6 (ref 5–15)
BUN: 15 mg/dL (ref 6–20)
CALCIUM: 8 mg/dL — AB (ref 8.9–10.3)
CO2: 22 mmol/L (ref 22–32)
CREATININE: 1.18 mg/dL — AB (ref 0.44–1.00)
Chloride: 111 mmol/L (ref 101–111)
GFR calc Af Amer: 50 mL/min — ABNORMAL LOW (ref >60)
GFR, EST NON AFRICAN AMERICAN: 43 mL/min — AB (ref >60)
Glucose, Bld: 143 mg/dL — ABNORMAL HIGH (ref 65–99)
POTASSIUM: 4 mmol/L (ref 3.5–5.1)
Sodium: 139 mmol/L (ref 135–145)

## 2015-04-01 LAB — MAGNESIUM: MAGNESIUM: 1.8 mg/dL (ref 1.7–2.4)

## 2015-04-01 LAB — CBC
HEMATOCRIT: 36 % (ref 35.0–47.0)
Hemoglobin: 11 g/dL — ABNORMAL LOW (ref 12.0–16.0)
MCH: 28.2 pg (ref 26.0–34.0)
MCHC: 30.6 g/dL — ABNORMAL LOW (ref 32.0–36.0)
MCV: 92.3 fL (ref 80.0–100.0)
Platelets: 350 10*3/uL (ref 150–440)
RBC: 3.9 MIL/uL (ref 3.80–5.20)
RDW: 15.9 % — AB (ref 11.5–14.5)
WBC: 5.3 10*3/uL (ref 3.6–11.0)

## 2015-04-01 LAB — SEDIMENTATION RATE: Sed Rate: 33 mm/hr — ABNORMAL HIGH (ref 0–30)

## 2015-04-01 LAB — TSH: TSH: 2.916 u[IU]/mL (ref 0.350–4.500)

## 2015-04-01 MED ORDER — FENTANYL CITRATE (PF) 100 MCG/2ML IJ SOLN: 25.0000 ug | INTRAMUSCULAR | Status: DC | PRN

## 2015-04-01 MED ORDER — FLUCONAZOLE 40 MG/ML PO SUSR
100.0000 mg | Freq: Every day | ORAL | Status: DC
  Administered 2015-04-01 – 2015-04-02 (×2): 100 mg via ORAL
  Filled 2015-04-01 (×5): qty 2.5

## 2015-04-01 MED ORDER — LEVOFLOXACIN 500 MG PO TABS
250.0000 mg | ORAL_TABLET | Freq: Every day | ORAL | Status: DC
  Administered 2015-04-01 – 2015-04-02 (×2): 250 mg via ORAL
  Filled 2015-04-01 (×2): qty 1

## 2015-04-01 MED ORDER — ONDANSETRON HCL 4 MG/2ML IJ SOLN: 4.0000 mg | Freq: Once | INTRAMUSCULAR | Status: AC | PRN

## 2015-04-01 NOTE — Care Management Important Message (Signed)
Important Message  Patient Details  Name: Diana [name] MRN: [id number] Date of Birth: Nov 18, 1934   Medicare Important Message Given:  Yes-fourth notification given    Alvie Heidelberg, RN 04/01/2015, 12:34 PM

## 2015-04-01 NOTE — Progress Notes (Signed)
[facility] - [place] at [facility]   PATIENT NAME: Diana [name]    MR#:  [id number]  DATE OF BIRTH:  1934-10-30  SUBJECTIVE:  CHIEF COMPLAINT: was low grade fever.  But feels much better now. Said in last 24 hrs - no fever and did not had any stool.  REVIEW OF SYSTEMS:  CONSTITUTIONAL: No fever, fatigue or weakness.  EYES: No blurred or double vision.  EARS, NOSE, AND THROAT: No tinnitus or ear pain.  RESPIRATORY: No cough, shortness of breath, wheezing or hemoptysis.  CARDIOVASCULAR: No chest pain, orthopnea, edema.  GASTROINTESTINAL: No nausea, vomiting, diarrhea , had some abdominal pain, while eating-now much better. GENITOURINARY: No dysuria, hematuria.  ENDOCRINE: No polyuria, nocturia,  HEMATOLOGY: No anemia, easy bruising or bleeding SKIN: No rash or lesion. MUSCULOSKELETAL: No joint pain or arthritis.   NEUROLOGIC: No tingling, numbness, weakness.  PSYCHIATRY: No anxiety or depression.   ROS  DRUG ALLERGIES:   Allergies  Allergen Reactions  . Amoxicillin Other (See Comments)    Reaction:  Stomach cramps   . Cefuroxime Itching  . Contrast Media [Iodinated Diagnostic Agents] Hives  . Cephalexin Rash  . Sulfonamide Derivatives Rash  . Tramadol Hcl Rash  . Venlafaxine Rash    VITALS:  Blood pressure 101/50, pulse 93, temperature 99.1 F (37.3 C), temperature source Oral, resp. rate 16, height 5\' 2"  (1.575 m), weight 66.996 kg (147 lb 11.2 oz), SpO2 96 %.  PHYSICAL EXAMINATION:   GENERAL: 80 y.o.-year-old patient lying in the bed with no acute distress. Weak appearing EYES: Pupils equal, round, reactive to light and accommodation. No scleral icterus. Extraocular muscles intact.  HEENT: Head atraumatic, normocephalic. Oropharynx and nasopharynx clear. Mucous membranes are dry NECK: Supple, no jugular venous distention. No thyroid enlargement, no tenderness.  LUNGS: Normal breath sounds bilaterally, no wheezing, rales, rhonchi or  crepitation. No use of accessory muscles of respiration.  CARDIOVASCULAR: S1, S2 normal. No murmurs, rubs, or gallops.  ABDOMEN: Soft, minimally tender, nondistended. Bowel sounds present. No organomegaly or mass. No guarding or rebound. Midline scar healing well with no erythema or drainage. There is also a healing area in the left upper quadrant from gastrostomy tube which is also healing well. EXTREMITIES: No pedal edema, cyanosis, or clubbing. Referral pulses 2+ NEUROLOGIC: Cranial nerves II through XII are intact. Muscle strength 5/5 in all extremities. Sensation intact. Gait not checked.  PSYCHIATRIC: The patient is alert and oriented x 3. Calm SKIN: No obvious rash, lesion, or ulcer.   Physical Exam LABORATORY PANEL:   CBC  Recent Labs Lab 04/01/15 1056  WBC 5.3  HGB 11.0*  HCT 36.0  PLT 350   ------------------------------------------------------------------------------------------------------------------  Chemistries   Recent Labs Lab 03/30/15 0847 04/01/15 1056  NA 141 139  K 3.0* 4.0  CL 107 111  CO2 27 22  GLUCOSE 145* 143*  BUN 17 15  CREATININE 1.29* 1.18*  CALCIUM 8.1* 8.0*  MG  --  1.8  AST 26  --   ALT 16  --   ALKPHOS 63  --   BILITOT 0.5  --    ------------------------------------------------------------------------------------------------------------------  Cardiac Enzymes No results for input(s): TROPONINI in the last 168 hours. ------------------------------------------------------------------------------------------------------------------  RADIOLOGY:  US Venous Img Lower Bilateral  03/31/2015   CLINICAL DATA:  Fever, history of malignancy, prior DVT, varicose veins.  EXAM: BILATERAL LOWER EXTREMITY VENOUS DOPPLER ULTRASOUND  TECHNIQUE: Gray-scale sonography with graded compression, as well as color Doppler and duplex ultrasound were performed to evaluate the  lower extremity deep venous systems from the level of the common femoral vein and  including the common femoral, femoral, profunda femoral, popliteal and calf veins including the posterior tibial, peroneal and gastrocnemius veins when visible. The superficial great saphenous vein was also interrogated. Spectral Doppler was utilized to evaluate flow at rest and with distal augmentation maneuvers in the common femoral, femoral and popliteal veins.  COMPARISON:  06/08/2014  FINDINGS: RIGHT LOWER EXTREMITY  Common Femoral Vein: No evidence of thrombus. Normal compressibility, respiratory phasicity and response to augmentation.  Saphenofemoral Junction: No evidence of thrombus. Normal compressibility and flow on color Doppler imaging.  Profunda Femoral Vein: No evidence of thrombus. Normal compressibility and flow on color Doppler imaging.  Femoral Vein: No evidence of thrombus. Normal compressibility, respiratory phasicity and response to augmentation.  Popliteal Vein: No evidence of thrombus. Normal compressibility, respiratory phasicity and response to augmentation.  Calf Veins: No evidence of thrombus. Normal compressibility and flow on color Doppler imaging.  Superficial Great Saphenous Vein: No evidence of thrombus. Normal compressibility and flow on color Doppler imaging.  Venous Reflux:  None.  Other Findings: Small right popliteal fossa minimally complex cystic area measures 1.6 x 0.8 x 2.2 cm compatible with an incidental Baker cyst.  LEFT LOWER EXTREMITY  Common Femoral Vein: No evidence of thrombus. Normal compressibility, respiratory phasicity and response to augmentation.  Saphenofemoral Junction: No evidence of thrombus. Normal compressibility and flow on color Doppler imaging.  Profunda Femoral Vein: No evidence of thrombus. Normal compressibility and flow on color Doppler imaging.  Femoral Vein: No evidence of thrombus. Normal compressibility, respiratory phasicity and response to augmentation.  Popliteal Vein: No evidence of thrombus. Normal compressibility, respiratory phasicity and  response to augmentation.  Calf Veins: No evidence of thrombus. Normal compressibility and flow on color Doppler imaging.  Superficial Great Saphenous Vein: No evidence of thrombus. Normal compressibility and flow on color Doppler imaging.  Venous Reflux:  None.  Other Findings:  None.  IMPRESSION: No evidence of deep venous thrombosis.  Small right popliteal fossa Baker cyst.   Electronically Signed   By: Judie Petit.  Shick M.D.   On: 03/31/2015 19:19   Dg Chest Port 1 View  03/31/2015   CLINICAL DATA:  PICC placement  EXAM: PORTABLE CHEST - 1 VIEW  COMPARISON:  03/26/2015  FINDINGS: Borderline heart size with normal pulmonary vascularity. Linear atelectasis in the lung bases. Hiatal hernia behind the heart. No pneumothorax. Right PICC catheter is been placed since prior study with tip overlying the lower SVC region. No pneumothorax.  IMPRESSION: Right PICC catheter appears to be in good placement. Mild cardiac enlargement. Linear atelectasis in the lung bases.   Electronically Signed   By: Burman Nieves M.D.   On: 03/31/2015 21:29    ASSESSMENT AND PLAN:    #1 fever: Differential is broad and includes - Infection: negative urinalysis, blood cultures. Pending collection on stool cultures and Clostridium difficile ( though she did not had BM in 24 hrs) .  no more fever in > 24 hrs- so I don't think we need ay more work up like endocarditis. - Thrombosis:      Pt is asymptomatic, No SOB, pain, DVT studies negative. - Autoimmune flare: She does have right knee pain without effusion warmth or erythema. Continue current lupus medications at this time. ESR is slightly high- but it is non specific in this acute setting- as she have surgery and possible esophagitis. - Atelectasis: Start incentive spirometry - Medications:  do not see  any potential sources of fever.    #2 acute on chronic kidney disease:  Monitor renal function.      creatinin 1.18, acceptable, she can be followed as out pt.   #3  hypothyroidism: normal TSH.   #4 hypokalemia: Rechecked potassium and magnesium.normal now.  #5: Healthcare acquired pneumonia: Bibasilar opacities seen in CT abdomen July 7. She has now received 5 days of Levaquin , no more cough, feels much better.  IF pt remains afebrile till tomorrow morning, and does not have diarrhe- I think she should be medically stable to be discharged.  She already feels much better after starting sucralfate and diflucan, and able to eat without pain.  All the records are reviewed and case discussed with Care Management/Social Workerr. Management plans discussed with the patient, family and they are in agreement.  CODE STATUS: full.  TOTAL TIME TAKING CARE OF THIS PATIENT: 35 minutes.   More than 50% of the visit was spent in counseling/coordination of care  POSSIBLE D/C IN 1 DAYS, DEPENDING ON CLINICAL CONDITION.   Altamese Dilling M.D on 04/01/2015   Between 7am to 6pm - Pager - [phone]  After 6pm go to [email or web address] - password [email or web address]  [place] [facility]  Office  [phone]  CC: Primary care physician; Sherlene Shams, MD

## 2015-04-01 NOTE — Progress Notes (Signed)
Consultation notes from GI and IM reviewed. Diflucan ordered. Levaquin changed to PO. Patient reports feeling better today. Patient reported intake does not match that recorded by staff. Exam: Lungs: Clear. Inspirex at 1000. ABD: BS normal, soft. Wound: Clean. Calves: Soft. No diarrhea in > 24 hours. (Had 2 loose stools prior). Labs pending.

## 2015-04-01 NOTE — Consult Note (Addendum)
Subjective: Patient seen for severe erosive esophagitis status post total hernia repair/gastropexy. She is doing much better today. She states that she was able eat some toast and some eggs this morning without pain on swallowing. She did have some mild nausea this morning. Is no emesis. Is no abdominal pain.  Objective: Vital signs in last 24 hours: Temp:  [98.6 F (37 C)-99.1 F (37.3 C)] 98.6 F (37 C) (07/13 1629) Pulse Rate:  [93-115] 101 (07/13 1629) Resp:  [16-20] 16 (07/13 1629) BP: (88-131)/(46-63) 110/63 mmHg (07/13 1629) SpO2:  [96 %-100 %] 100 % (07/13 1629) Weight:  [66.996 kg (147 lb 11.2 oz)] 66.996 kg (147 lb 11.2 oz) (07/13 0500) Blood pressure 110/63, pulse 101, temperature 98.6 F (37 C), temperature source Oral, resp. rate 16, height 5\' 2"  (1.575 m), weight 66.996 kg (147 lb 11.2 oz), SpO2 100 %.   Intake/Output from previous day: 07/12 0701 - 07/13 0700 In: 1825 [P.O.:240; I.V.:1585] Out: 551 [Urine:550; Stool:1]  Intake/Output this shift: Total I/O In: 1894 [P.O.:1190; I.V.:704] Out: 500 [Urine:500]   General appearance:  Well-appearing 80 year old female no acute distress Resp:  Clear to auscultation Cardio:  Regular rate and rhythm GI:  soft nontender nondistended bowel sounds positive normoactive, Extremities:  Clubbing cyanosis or edema   Lab Results: Results for orders placed or performed during the hospital encounter of 03/26/15 (from the past 24 hour(s))  Sedimentation rate     Status: Abnormal   Collection Time: 04/01/15 12:12 AM  Result Value Ref Range   Sed Rate 33 (H) 0 - 30 mm/hr  TSH     Status: None   Collection Time: 04/01/15 12:12 AM  Result Value Ref Range   TSH 2.916 0.350 - 4.500 uIU/mL  CBC     Status: Abnormal   Collection Time: 04/01/15 10:56 AM  Result Value Ref Range   WBC 5.3 3.6 - 11.0 K/uL   RBC 3.90 3.80 - 5.20 MIL/uL   Hemoglobin 11.0 (L) 12.0 - 16.0 g/dL   HCT 36.0 35.0 - 47.0 %   MCV 92.3 80.0 - 100.0 fL   MCH  28.2 26.0 - 34.0 pg   MCHC 30.6 (L) 32.0 - 36.0 g/dL   RDW 15.9 (H) 11.5 - 14.5 %   Platelets 350 150 - 440 K/uL  Basic metabolic panel     Status: Abnormal   Collection Time: 04/01/15 10:56 AM  Result Value Ref Range   Sodium 139 135 - 145 mmol/L   Potassium 4.0 3.5 - 5.1 mmol/L   Chloride 111 101 - 111 mmol/L   CO2 22 22 - 32 mmol/L   Glucose, Bld 143 (H) 65 - 99 mg/dL   BUN 15 6 - 20 mg/dL   Creatinine, Ser 1.18 (H) 0.44 - 1.00 mg/dL   Calcium 8.0 (L) 8.9 - 10.3 mg/dL   GFR calc non Af Amer 43 (L) >60 mL/min   GFR calc Af Amer 50 (L) >60 mL/min   Anion gap 6 5 - 15  Magnesium     Status: None   Collection Time: 04/01/15 10:56 AM  Result Value Ref Range   Magnesium 1.8 1.7 - 2.4 mg/dL      Recent Labs  03/30/15 0847 04/01/15 1056  WBC 5.8 5.3  HGB 12.1 11.0*  HCT 38.6 36.0  PLT 455* 350   BMET  Recent Labs  03/30/15 0847 04/01/15 1056  NA 141 139  K 3.0* 4.0  CL 107 111  CO2 27 22  GLUCOSE 145*  143*  BUN 17 15  CREATININE 1.29* 1.18*  CALCIUM 8.1* 8.0*   LFT  Recent Labs  03/30/15 0847  PROT 5.9*  ALBUMIN 2.8*  AST 26  ALT 16  ALKPHOS 63  BILITOT 0.5   PT/INR  Recent Labs  03/30/15 0847  LABPROT 18.8*  INR 1.55   Hepatitis Panel No results for input(s): HEPBSAG, HCVAB, HEPAIGM, HEPBIGM in the last 72 hours. C-Diff No results for input(s): CDIFFTOX in the last 72 hours. No results for input(s): CDIFFPCR in the last 72 hours.   Studies/Results: US Venous Img Lower Bilateral  03/31/2015   CLINICAL DATA:  Fever, history of malignancy, prior DVT, varicose veins.  EXAM: BILATERAL LOWER EXTREMITY VENOUS DOPPLER ULTRASOUND  TECHNIQUE: Gray-scale sonography with graded compression, as well as color Doppler and duplex ultrasound were performed to evaluate the lower extremity deep venous systems from the level of the common femoral vein and including the common femoral, femoral, profunda femoral, popliteal and calf veins including the posterior  tibial, peroneal and gastrocnemius veins when visible. The superficial great saphenous vein was also interrogated. Spectral Doppler was utilized to evaluate flow at rest and with distal augmentation maneuvers in the common femoral, femoral and popliteal veins.  COMPARISON:  06/08/2014  FINDINGS: RIGHT LOWER EXTREMITY  Common Femoral Vein: No evidence of thrombus. Normal compressibility, respiratory phasicity and response to augmentation.  Saphenofemoral Junction: No evidence of thrombus. Normal compressibility and flow on color Doppler imaging.  Profunda Femoral Vein: No evidence of thrombus. Normal compressibility and flow on color Doppler imaging.  Femoral Vein: No evidence of thrombus. Normal compressibility, respiratory phasicity and response to augmentation.  Popliteal Vein: No evidence of thrombus. Normal compressibility, respiratory phasicity and response to augmentation.  Calf Veins: No evidence of thrombus. Normal compressibility and flow on color Doppler imaging.  Superficial Great Saphenous Vein: No evidence of thrombus. Normal compressibility and flow on color Doppler imaging.  Venous Reflux:  None.  Other Findings: Small right popliteal fossa minimally complex cystic area measures 1.6 x 0.8 x 2.2 cm compatible with an incidental Baker cyst.  LEFT LOWER EXTREMITY  Common Femoral Vein: No evidence of thrombus. Normal compressibility, respiratory phasicity and response to augmentation.  Saphenofemoral Junction: No evidence of thrombus. Normal compressibility and flow on color Doppler imaging.  Profunda Femoral Vein: No evidence of thrombus. Normal compressibility and flow on color Doppler imaging.  Femoral Vein: No evidence of thrombus. Normal compressibility, respiratory phasicity and response to augmentation.  Popliteal Vein: No evidence of thrombus. Normal compressibility, respiratory phasicity and response to augmentation.  Calf Veins: No evidence of thrombus. Normal compressibility and flow on color  Doppler imaging.  Superficial Great Saphenous Vein: No evidence of thrombus. Normal compressibility and flow on color Doppler imaging.  Venous Reflux:  None.  Other Findings:  None.  IMPRESSION: No evidence of deep venous thrombosis.  Small right popliteal fossa Baker cyst.   Electronically Signed   By: Jerilynn Mages.  Shick M.D.   On: 03/31/2015 19:19   Dg Chest Port 1 View  03/31/2015   CLINICAL DATA:  PICC placement  EXAM: PORTABLE CHEST - 1 VIEW  COMPARISON:  03/26/2015  FINDINGS: Borderline heart size with normal pulmonary vascularity. Linear atelectasis in the lung bases. Hiatal hernia behind the heart. No pneumothorax. Right PICC catheter is been placed since prior study with tip overlying the lower SVC region. No pneumothorax.  IMPRESSION: Right PICC catheter appears to be in good placement. Mild cardiac enlargement. Linear atelectasis in the lung bases.  Electronically Signed   By: Lucienne Capers M.D.   On: 03/31/2015 21:29    Scheduled Inpatient Medications:   . budesonide-formoterol  2 puff Inhalation BID  . cyanocobalamin  1,000 mcg Intramuscular Q30 days  . enoxaparin (LOVENOX) injection  40 mg Subcutaneous Q24H  . fluconazole  100 mg Oral Daily  . hydroxychloroquine  200 mg Oral Daily  . letrozole  2.5 mg Oral Daily  . levofloxacin  250 mg Oral Daily  . levothyroxine  150 mcg Oral QAC breakfast  . loratadine  10 mg Oral Daily  . mycophenolate  1,000 mg Oral BID  . pantoprazole (PROTONIX) IV  40 mg Intravenous Q12H  . pneumococcal 23 valent vaccine  0.5 mL Intramuscular Tomorrow-1000  . predniSONE  5 mg Oral Daily  . sodium chloride  3 mL Intravenous Q12H  . sucralfate  1 g Oral TID WC & HS  . Tadalafil (PAH)  40 mg Oral Daily    Continuous Inpatient Infusions:   . dextrose 5 % and 0.45 % NaCl with KCl 40 mEq/L 40 mL/hr at 04/01/15 1628    PRN Inpatient Medications:  sodium chloride, acetaminophen **OR** acetaminophen, ALPRAZolam, fentaNYL (SUBLIMAZE) injection, fluticasone,  HYDROcodone-acetaminophen, morphine injection, ondansetron **OR** ondansetron (ZOFRAN) IV, ondansetron (ZOFRAN) IV, promethazine, sodium chloride  Miscellaneous:   Assessment:  1. As noted severe erosive esophagitis that is post large intrathoracic hiatal hernia/gastric volvulus. Currently on IV twice a day PPI and Carafate.  Plan:  1. Continue current. Would continue the IV PPI for 2-3 days before changing to twice a day by mouth and continue that for about a month. Likely need daily use after that. Continue Carafate 4-6 weeks. 2. Agree with Diflucan as you are  Lollie Sails MD 04/01/2015, 4:38 PM

## 2015-04-02 ENCOUNTER — Other Ambulatory Visit: Payer: Medicare Other

## 2015-04-02 ENCOUNTER — Ambulatory Visit: Payer: Medicare Other | Admitting: Oncology

## 2015-04-02 LAB — ANTI-DNA ANTIBODY, DOUBLE-STRANDED

## 2015-04-02 MED ORDER — WARFARIN SODIUM 3 MG PO TABS
5.0000 mg | ORAL_TABLET | Freq: Every day | ORAL | Status: DC
  Administered 2015-04-03: 5 mg via ORAL
  Filled 2015-04-02: qty 1

## 2015-04-02 NOTE — Progress Notes (Signed)
Nutrition Follow-up       INTERVENTION:   Nutrition Supplement Therapy: Pt wanting magic cup discontinued at this time.  Meals and snacks: Cater to pt preferences. Encouraged intake of milkshake and high calorie foods.   NUTRITION DIAGNOSIS:   Inadequate oral intake related to altered GI function, acute illness as evidenced by per patient/family report, slowly improving.   GOAL:   Patient will meet greater than or equal to 90% of their needs  Not meeting nutritional needs, but intake improving  MONITOR:    (Energy Intake, Digestive system, Anthropometrics, Electrolyte and renal Profile)  REASON FOR ASSESSMENT:   Consult Assessment of nutrition requirement/status  ASSESSMENT:    Noted possible discharge tomorrow per MD note  Diet Order: regular  Current Nutrition: no breakfast eaten this am, full tray at bedside.  Pt can't remember what she ate yesterday. Per I and O sheet noted Lacinda Axon out milkshake (roughly 400-500 kcals), whole popsicle, and 1 piece of toast with jelly and egg. Does not like magic cup and wants it discontinued   Medications: reviewed  Electrolyte/Renal Profile and Glucose Profile:   Recent Labs Lab 03/28/15 0759 03/30/15 0847 04/01/15 1056  NA 143 141 139  K 3.4* 3.0* 4.0  CL 106 107 111  CO2 27 27 22   BUN 13 17 15   CREATININE 1.01* 1.29* 1.18*  CALCIUM 8.4* 8.1* 8.0*  MG  --   --  1.8  GLUCOSE 99 145* 143*   Protein Profile:  Recent Labs Lab 03/30/15 0847  ALBUMIN 2.8*      Weight Trend since Admission: Filed Weights   03/31/15 0500 04/01/15 0500 04/02/15 0500  Weight: 149 lb 12.8 oz (67.949 kg) 147 lb 11.2 oz (66.996 kg) 154 lb 14.4 oz (70.262 kg)      Diet Order:  Diet regular Room service appropriate?: Yes; Fluid consistency:: Thin   Last BM:   7/11  Height:   Ht Readings from Last 1 Encounters:  03/26/15 5\' 2"  (1.575 m)    Weight:   Wt Readings from Last 1 Encounters:  04/02/15 154 lb 14.4 oz (70.262 kg)     Ideal Body Weight:     Wt Readings from Last 10 Encounters:  04/02/15 154 lb 14.4 oz (70.262 kg)  03/24/15 138 lb (62.596 kg)  03/20/15 141 lb 1.6 oz (64.003 kg)  02/10/15 154 lb (69.854 kg)  02/05/15 154 lb (69.854 kg)  12/17/14 154 lb 1.9 oz (69.908 kg)  11/19/14 156 lb (70.761 kg)  11/13/14 155 lb 6 oz (70.478 kg)  11/10/14 157 lb 2 oz (71.271 kg)  06/10/14 153 lb (69.4 kg)    BMI:  Body mass index is 28.32 kg/(m^2).  Estimated Nutritional Needs:   Kcal:  1529-1807kcals, BEE: 1069kcals, TEE: (IF 1.1-1.3)(AF 1.3)   Protein:  64-76g protein (1.0-1.2g/kg)   Fluid:  1590-1955mL of fluid (25-11mL/kg)  EDUCATION NEEDS:   No education needs identified at this time   [place]. Zenia Resides, [place], [place] (pager)

## 2015-04-02 NOTE — Progress Notes (Addendum)
Tmax 100.6, mildly low BP, better today. Did very well with PO yesterday. No bowel movements. No abdominal pain. Lungs: Clear. Sats 98-100%. Cardio: RR. ABD: Soft. Non-tender. G tube site erythema nearly completely resolved. Calves: soft. Venous u/s: Neg.  Yesterday, patient had a chill that lasted several hours. Reports she has had episodes in the past (prior to initial hospitalization). Temp at the time of the chill was 98.6.  Will plan to restart coumadin. If good po is maintained today, could make a strong argument for d/c home tomorrow w/po BID PPI to complete 5 days of Diflucan.  Patient is motivated, and looks much better over the last 48 hours. Temp is still a mystery.  Dr. Jamal Collin will be following from this point in my absence.   Previously on coumadin, 5 mg daily.  Admitted w/ high PT INR. Will complete 10 days of Levaquin on Saturday, five days of Diflucan on Sunday. Likely d/c dose will be 2.5 mg daily.  Will defer to medicine re: bridging w/ lovenox during crossover.

## 2015-04-03 ENCOUNTER — Telehealth: Payer: Self-pay

## 2015-04-03 LAB — PROTIME-INR
INR: 1.47
Prothrombin Time: 18 seconds — ABNORMAL HIGH (ref 11.4–15.0)

## 2015-04-03 MED ORDER — LEVOFLOXACIN 250 MG PO TABS: 250.0000 mg | ORAL_TABLET | Freq: Every day | ORAL | Status: DC

## 2015-04-03 MED ORDER — FLUCONAZOLE 40 MG/ML PO SUSR: 100.0000 mg | Freq: Every day | ORAL | Status: DC

## 2015-04-03 MED ORDER — SUCRALFATE 1 GM/10ML PO SUSP: 1.0000 g | Freq: Three times a day (TID) | ORAL | Status: DC

## 2015-04-03 NOTE — Care Management Important Message (Signed)
Important Message  Patient Details  Name: Diana [name] MRN: [id number] Date of Birth: June 14, 1935   Medicare Important Message Given:  Yes-second notification given    Juliann Pulse A Allmond 04/03/2015, 10:17 AM

## 2015-04-03 NOTE — Telephone Encounter (Signed)
Transition Care Management Follow-up Telephone Call   Date discharged?04/03/15   How have you been since you were released from the hospital? I think I still have pneumonia. Its not as bad, but I am still having some trouble breathing.   Do you understand why you were in the hospital? Yes, I had pneumonia.   Do you understand the discharge instrcutions? Yes and I believe [facility] is coming to help with things every other day.   Where were you discharged to? Home   Items Reviewed:  Medications reviewed: Yes  Allergies reviewed: Yes  Dietary changes reviewed: Yes  Referrals reviewed: Yes   Functional Questionnaire:   Activities of Daily Living (ADLs):   She states they are independent in the following: I am doing it all myself right now, but [facility] is coming to help me.  States they require assistance with the following: Bathing and dressing.   Any transportation issues/concerns?: No, I have some friends and family to help me get around.     Any patient concerns? Still having some trouble breathing.   Confirmed importance and date/time of follow-up visits scheduled Yes, appointment is 04/06/15.  Provider Appointment booked with PCP, Dr. Derrel Nip.  Confirmed with patient if condition begins to worsen call PCP or go to the ER.  Patient was given the office number and encouraged to call back with question or concerns.  : Yes, patient verbalized understanding.

## 2015-04-03 NOTE — Progress Notes (Signed)
Gave discharge instructions, follow-up appointments, and prescriptions to pt. Pt verbalized understanding and stated that she did not have any questions. I removed PICC line, line was intact, gauze and dressing were applied. No complications noted. Pt will be discharged with family in a wheelchair by volunteer.

## 2015-04-03 NOTE — Care Management (Signed)
Spoke with patient again today and she is tolerating diet although she stated that there are still some food she cant eat. Plan is for discharge today with Home Health . Patient was open to [facility] prior to readmission. Patient has Environmental consultant at home and lives with adult son. Patient has been ambulating to restroom with out assistance here stated that she feel able to go home without difficulty. Attending will need to place resumption of care orders. Notified [facility] of impending discharge today.

## 2015-04-03 NOTE — Discharge Instructions (Signed)
Be sure to take the full regimen of antibiotics.

## 2015-04-03 NOTE — Progress Notes (Signed)
Patient ID: Diana [name], female   DOB: [birth year], 80 y.o.   MRN: [id number] No complaints. Feels she is ready to go home. Oral intake is good. No n/v. OK to discharge. PT is 18, INR 1,47Will start her back on 5 mg coumadin daily.

## 2015-04-04 DIAGNOSIS — Z431 Encounter for attention to gastrostomy: Secondary | ICD-10-CM | POA: Diagnosis not present

## 2015-04-04 DIAGNOSIS — M329 Systemic lupus erythematosus, unspecified: Secondary | ICD-10-CM | POA: Diagnosis not present

## 2015-04-04 DIAGNOSIS — D62 Acute posthemorrhagic anemia: Secondary | ICD-10-CM | POA: Diagnosis not present

## 2015-04-04 DIAGNOSIS — E119 Type 2 diabetes mellitus without complications: Secondary | ICD-10-CM | POA: Diagnosis not present

## 2015-04-04 DIAGNOSIS — I272 Other secondary pulmonary hypertension: Secondary | ICD-10-CM | POA: Diagnosis not present

## 2015-04-04 DIAGNOSIS — Z48815 Encounter for surgical aftercare following surgery on the digestive system: Secondary | ICD-10-CM | POA: Diagnosis not present

## 2015-04-05 LAB — CULTURE, BLOOD (ROUTINE X 2)
Culture: NO GROWTH
Culture: NO GROWTH

## 2015-04-06 ENCOUNTER — Encounter: Payer: Self-pay | Admitting: Internal Medicine

## 2015-04-06 ENCOUNTER — Ambulatory Visit (INDEPENDENT_AMBULATORY_CARE_PROVIDER_SITE_OTHER): Payer: Medicare Other | Admitting: Internal Medicine

## 2015-04-06 VITALS — BP 100/60 | HR 96 | Temp 98.3°F | Resp 14 | Ht 62.0 in | Wt 134.0 lb

## 2015-04-06 DIAGNOSIS — K209 Esophagitis, unspecified without bleeding: Secondary | ICD-10-CM

## 2015-04-06 DIAGNOSIS — R29898 Other symptoms and signs involving the musculoskeletal system: Secondary | ICD-10-CM

## 2015-04-06 DIAGNOSIS — Z9889 Other specified postprocedural states: Secondary | ICD-10-CM

## 2015-04-06 DIAGNOSIS — L89151 Pressure ulcer of sacral region, stage 1: Secondary | ICD-10-CM

## 2015-04-06 DIAGNOSIS — Z9181 History of falling: Secondary | ICD-10-CM

## 2015-04-06 DIAGNOSIS — Z7901 Long term (current) use of anticoagulants: Secondary | ICD-10-CM

## 2015-04-06 DIAGNOSIS — E46 Unspecified protein-calorie malnutrition: Secondary | ICD-10-CM | POA: Insufficient documentation

## 2015-04-06 DIAGNOSIS — J189 Pneumonia, unspecified organism: Secondary | ICD-10-CM

## 2015-04-06 DIAGNOSIS — M6281 Muscle weakness (generalized): Secondary | ICD-10-CM

## 2015-04-06 DIAGNOSIS — R634 Abnormal weight loss: Secondary | ICD-10-CM | POA: Diagnosis not present

## 2015-04-06 DIAGNOSIS — K3189 Other diseases of stomach and duodenum: Secondary | ICD-10-CM

## 2015-04-06 NOTE — Assessment & Plan Note (Addendum)
Secondary to recent gastric surgery and severe esophagitis .  She has not tolerated Boost or Ensure due to heaviness of beverage.  Recommended almond milk light.  Will have Edgar RN provide nutritional support

## 2015-04-06 NOTE — Progress Notes (Addendum)
Subjective:  Patient ID: Diana [name], female    DOB: 12/31/1934  Age: 80 y.o. MRN: [id number]  CC: The primary encounter diagnosis was Weakness of back. Diagnoses of History of recent fall, Sacral decubitus ulcer, stage I, H/O abdominal surgery, Loss of weight, Long term current use of anticoagulant therapy, Chronic anticoagulation, Esophagitis, Gastric volvulus, Protein-calorie malnutrition, History of fall within past 90 days, Bilateral pneumonia, Decubitus ulcer of sacral region, stage 1, and Truncal muscle weakness were also pertinent to this visit.  HPI Diana [name] presents for hospital follow up.  She Has had three admissions to [facility] since her last visit with me:  May 25  for L1-L2 Kyphoplasty for persistent pain following vertebral fracture  June 21  To July 1 for acute abdominal pain secondary to volvulus of stomach and small intestine requiring exploratory  Laparotomy, gastropexy and placement of Moss  gastrostomy tube, incidental splenectomy , and transfusion of FFP to reverse coumadin level,  July  5: readmitted with dehydration , severe esophagitis and RLL pneumonia.  Underwent  EGD July 9th, requiring  FFP x 2 for INR reversal,  Received 5 days of IV levaquin and fluids via PICC line , rei maged due to persistent fevers with CT of abd/pelvis suggested bibasilar opacities R.>L ,  herniation of large portion of stomach into thoracic cavity.  She Was discharged on Friday,  July 15 after two days without fever and improved PO intake. . taking carafate 4 times daily.  5 mg coumadin restarted on July 15 at which time the INR was 1.47.  Was prescribed course of levaquin  for two more days after discharge.   No probiotics advised,.   She was discharged home with  increased edema in legs, and states that she had nocturia x 8.  ON the 8th time she got up  to void and fell backward and struck her left hip on the floor.   No bruising , not painful now.   "I still have pneumonia"  She  reports copiis amounts of sinus drainage which is white and thick , when it was previously  green .  She is not wearing her supplemental oxygen today and is not dyspneic at rest.  She has some pain with deep breathing focused over the left lateral thorax. . Some frontal sinus pain nand increased drainiage which chokes her at times.  NO fevers.   Continues to have post prandial emesis after certain foods,  Just after a few bites, ,  Has tried ensure and boost,  Both of which caused emesis .  Today has tolerated clear liquids ,  And graham cracker with peanut butter this morning   She is ambulating today without a walker but has one at home.  Was discharged to home in company of son.  Was too weak to walk without walker.  Home health RN , PT and [facility] aide ordered for 3 times weekly.  Has early stage 1 decubitus ulcer noted by [facility] RN.  H as had a 20 lb weight loss over the last 3 months .     Outpatient Prescriptions Prior to Visit  Medication Sig Dispense Refill  . ALPRAZolam (XANAX) 0.5 MG tablet Take 0.5 mg by mouth at bedtime as needed for anxiety.    . budesonide-formoterol (SYMBICORT) 160-4.5 MCG/ACT inhaler Inhale 2 puffs into the lungs 2 (two) times daily.    . Calcium Carbonate-Vitamin D (CALCIUM + D PO) Take 1 tablet by mouth daily.    Marland Kitchen  cyanocobalamin (,VITAMIN B-12,) 1000 MCG/ML injection Inject 1 mL (1,000 mcg total) into the muscle every 30 (thirty) days. 10 mL 1  . fluticasone (FLONASE) 50 MCG/ACT nasal spray Place 2 sprays into both nostrils daily as needed for rhinitis.    . furosemide (LASIX) 20 MG tablet Take 20 mg by mouth daily.    Marland Kitchen HYDROcodone-acetaminophen (NORCO/VICODIN) 5-325 MG per tablet Take 1-2 tablets by mouth every 4 (four) hours as needed for moderate pain. 30 tablet 0  . hydroxychloroquine (PLAQUENIL) 200 MG tablet Take 200 mg by mouth daily.    Marland Kitchen letrozole (FEMARA) 2.5 MG tablet Take 2.5 mg by mouth daily.    . metoprolol succinate (TOPROL-XL) 25 MG 24 hr tablet Take 25  mg by mouth daily.    . mycophenolate (CELLCEPT) 500 MG tablet Take 1,000 mg by mouth 2 (two) times daily.     . ondansetron (ZOFRAN-ODT) 4 MG disintegrating tablet Take 1 tablet (4 mg total) by mouth every 4 (four) hours as needed for nausea or vomiting. 20 tablet 0  . predniSONE (DELTASONE) 5 MG tablet Take 5 mg by mouth daily.    . ranitidine (ZANTAC) 150 MG tablet Take 1 tablet (150 mg total) by mouth at bedtime. 90 tablet 2  . sucralfate (CARAFATE) 1 GM/10ML suspension Take 10 mLs (1 g total) by mouth 4 (four) times daily -  with meals and at bedtime. 420 mL 0  . SYNTHROID 150 MCG tablet Take 1 tablet (150 mcg total) by mouth daily. 30 tablet 11  . Tadalafil, PAH, (ADCIRCA) 20 MG TABS Take 40 mg by mouth daily.    Marland Kitchen warfarin (COUMADIN) 5 MG tablet Take 1 tablet (5 mg total) by mouth daily. 30 tablet 3  . diphenhydrAMINE (BENADRYL) 50 MG tablet Take 1 tablet (50 mg total) by mouth once. Take 50 mg Benadryl 1 hour prior to CT scan (Patient not taking: Reported on 04/06/2015) 1 tablet 0  . fluconazole (DIFLUCAN) 40 MG/ML suspension Take 2.5 mLs (100 mg total) by mouth daily. (Patient not taking: Reported on 04/06/2015) 35 mL 0  . levofloxacin (LEVAQUIN) 250 MG tablet Take 1 tablet (250 mg total) by mouth daily. (Patient not taking: Reported on 04/06/2015) 2 tablet 0   No facility-administered medications prior to visit.    Review of Systems;  Patient denies headache, fevers, malaise, unintentional weight loss, skin rash, eye pain, sinus congestion and sinus pain, sore throat, dysphagia,  hemoptysis , cough, dyspnea, wheezing, chest pain, palpitations, orthopnea, edema, abdominal pain, nausea, melena, diarrhea, constipation, flank pain, dysuria, hematuria, urinary  Frequency, nocturia, numbness, tingling, seizures,  Focal weakness, Loss of consciousness,  Tremor, insomnia, depression, anxiety, and suicidal ideation.      Objective:  BP 100/60 mmHg  Pulse 96  Temp(Src) 98.3 F (36.8 C) (Oral)   Resp 14  Ht 5\' 2"  (1.575 m)  Wt 134 lb (60.782 kg)  BMI 24.50 kg/m2  SpO2 96%  BP Readings from Last 3 Encounters:  04/06/15 100/60  04/03/15 110/53  03/24/15 122/68    Wt Readings from Last 3 Encounters:  04/06/15 134 lb (60.782 kg)  04/03/15 158 lb 12.8 oz (72.031 kg)  03/24/15 138 lb (62.596 kg)    General appearance: alert, cooperative and appears stated age Ears: normal TM's and external ear canals both ears Throat: lips, mucosa, and tongue normal; teeth and gums normal Neck: no adenopathy, no carotid bruit, supple, symmetrical, trachea midline and thyroid not enlarged, symmetric, no tenderness/mass/nodules Back: symmetric, no curvature. ROM normal.  No CVA tenderness. Lungs: clear to auscultation bilaterally Heart: regular rate and rhythm, S1, S2 normal, no murmur, click, rub or gallop Abdomen: soft, non-tender; bowel sounds normal; no masses,  no organomegaly Pulses: 2+ and symmetric Skin: Skin color, texture, turgor normal. No rashes or lesions Lymph nodes: Cervical, supraclavicular, and axillary nodes normal.  Lab Results  Component Value Date   HGBA1C 7.1* 11/19/2014   HGBA1C 6.9* 04/22/2014   HGBA1C 6.5 01/10/2014    Lab Results  Component Value Date   CREATININE 1.18* 04/01/2015   CREATININE 1.29* 03/30/2015   CREATININE 1.01* 03/28/2015    Lab Results  Component Value Date   WBC 5.3 04/01/2015   HGB 11.0* 04/01/2015   HCT 36.0 04/01/2015   PLT 350 04/01/2015   GLUCOSE 143* 04/01/2015   CHOL 164 10/11/2013   TRIG 152.0* 10/11/2013   HDL 69.70 10/11/2013   LDLCALC 64 10/11/2013   ALT 16 03/30/2015   AST 26 03/30/2015   NA 139 04/01/2015   K 4.0 04/01/2015   CL 111 04/01/2015   CREATININE 1.18* 04/01/2015   BUN 15 04/01/2015   CO2 22 04/01/2015   TSH 2.916 04/01/2015   INR 1.47 04/03/2015   HGBA1C 7.1* 11/19/2014   MICROALBUR <0.7 11/28/2014    X-ray Chest Pa And Lateral  03/26/2015   CLINICAL DATA:  Upper chest pressure, wheezing,  history of lupus  EXAM: CHEST  2 VIEW  COMPARISON:  Chest x-ray of 03/11/2015 and 03/10/2015, as well as CT abdomen pelvis of 03/11/2015  FINDINGS: Much of the opacity at the left lung bases is created by a large hiatal hernia. There is adjacent linear atelectasis as well. No focal infiltrate or definite effusion is seen. Mild cardiomegaly is stable. The bones are osteopenic. Lower thoracic or upper lumbar vertebroplasties are noted.  IMPRESSION: Opacity at the lung bases appears to be due to a large hiatal hernia with adjacent atelectasis. No definite active process is seen.   Electronically Signed   By: Dwyane Dee M.D.   On: 03/26/2015 15:39   Ct Abdomen W Contrast  03/26/2015   CLINICAL DATA:  Patient with prior history of gastric volvulus status post surgical repair. History of breast cancer 07/2014. Nausea and vomiting. Postprocedural fever.  EXAM: CT ABDOMEN WITH CONTRAST  TECHNIQUE: Multidetector CT imaging of the abdomen was performed using the standard protocol following bolus administration of intravenous contrast.  CONTRAST:  85mL OMNIPAQUE IOHEXOL 300 MG/ML  SOLN  COMPARISON:  CT abdomen pelvis 03/11/2015  FINDINGS: Lower chest: Heart is enlarged. Trace pericardial fluid. Consolidative opacities within the right greater than left lower lobes. There is a large portion of the stomach herniated into the chest. Small right pleural effusion.  Hepatobiliary: Liver is normal in size and contour without focal hepatic lesion identified. Gallbladder is unremarkable. No intrahepatic or extrahepatic biliary ductal dilatation.  Pancreas: Atrophic.  Spleen: Surgically absent.  Adrenals/Urinary Tract: Adrenal glands are unremarkable. Kidneys enhance symmetrically with contrast. Re- demonstrated 4 cm cyst within the superior pole of the left kidney. Subcentimeter low-attenuation lesions within the inferior pole of the right kidney, too small to characterize.  Stomach/Bowel: There is a large portion of the stomach  herniated into the chest. The stomach appears in normal orientation. Oral contrast material is demonstrated throughout the stomach and visualized small bowel. There is extensive diverticulosis involving the visualized colon. Transverse colon is relatively decompressed however there is suggestion of mild wall thickening. Midline surgical incision. No free intraperitoneal air.  Vascular/Lymphatic: Tortuosity of  the abdominal aorta. No retroperitoneal lymphadenopathy.  Other: None  Musculoskeletal: Patient with L1 and L2 vertebral kyphoplasty material. Chronic L1 compression deformity.  IMPRESSION: Improved anatomic orientation of the stomach. There is still a large portion of the stomach which is herniated into thorax. No evidence for obstruction.  Consolidative opacities within the right greater than left lower lobes may represent pneumonia in the appropriate clinical setting.  The transverse colon is decompressed, limiting evaluation however there is suggestion of possible mild wall thickening. Recommend correlation for signs of colitis.   Electronically Signed   By: Annia Belt M.D.   On: 03/26/2015 11:55    Assessment & Plan:   Problem List Items Addressed This Visit      Unprioritized   Decubitus ulcer of sacral region, stage 1    Secondary to weight loss, poor nutrtional status and prolonged illness.  She needs to have an air overlay mattress to decrease the pressure on her sacral area.       Truncal muscle weakness    She is having difficulty getting out of bed due to recent abdominal surgery and muscle weakness,  And needs to have the head of her bed raised  Due to her large hiatal hernia causing reflux  .  She needs an electric hospital  bed to manage her reflux and truncal weakness.        Pneumonia    She received 7 days of Levaquin .  Most recent CXR suggested that the LLL infiltrate was actually due to the large hiatal hernia .  She has excellent breath sounds and no egophony on  Exam.   No indication for continued antibiotics.       Protein-calorie malnutrition    Secondary to recent gastric surgery and severe esophagitis .  She has not tolerated Boost or Ensure due to heaviness of beverage.  Recommended almond milk light.  Will have HH RN provide nutritional support       History of fall within past 90 days    Occurred following discharge laslt weekedn, while getting up to void,  Bedside commode ordered       Chronic anticoagulation    INr was 1.47 on July 15 and she has been taking 5 mg daily .   Lab Results  Component Value Date   INR 1.47 04/03/2015   INR 1.55 03/30/2015   INR 1.68 03/28/2015         Gastric volvulus    S/p gastropexy and gastrostomy tube, now removed.      Esophagitis    Severe, by recent EGD, secondary to large hiatal hernia .  Oral intake has been poorly tolerated since July 15 discharge until today . Continue sucralfate and ranitidine .advance diet slowly, avoid acidic liquids.       Long term current use of anticoagulant therapy   Relevant Orders   INR/PT   CBC with Differential/Platelet    Other Visit Diagnoses    Weakness of back    -  Primary    Relevant Orders    DME Hospital bed    History of recent fall        Relevant Orders    DME Bedside commode    Sacral decubitus ulcer, stage I        Relevant Orders    DME Hospital bed    H/O abdominal surgery        Relevant Orders    DME Bedside commode    Loss of weight  Relevant Orders    Basic metabolic panel       I am having Ms. Humphries maintain her cyanocobalamin, mycophenolate, furosemide, warfarin, ranitidine, SYNTHROID, ALPRAZolam, fluticasone, hydroxychloroquine, letrozole, metoprolol succinate, predniSONE, Tadalafil (PAH), Calcium Carbonate-Vitamin D (CALCIUM + D PO), budesonide-formoterol, HYDROcodone-acetaminophen, ondansetron, diphenhydrAMINE, fluconazole, levofloxacin, and sucralfate.  No orders of the defined types were placed in this encounter.      There are no discontinued medications.  Follow-up: No Follow-up on file.   Sherlene Shams, MD

## 2015-04-06 NOTE — Patient Instructions (Signed)
I have ordered a hospital bed and a bedside commode  Stick with foods that you are tolerating for another day or two before experimenting  You can try Almond Milk Light as an alternative to milk

## 2015-04-06 NOTE — Assessment & Plan Note (Signed)
INr was 1.47 on July 15 and she has been taking 5 mg daily .   Lab Results  Component Value Date   INR 1.47 04/03/2015   INR 1.55 03/30/2015   INR 1.68 03/28/2015

## 2015-04-06 NOTE — Assessment & Plan Note (Signed)
S/p gastropexy and gastrostomy tube, now removed.

## 2015-04-06 NOTE — Assessment & Plan Note (Signed)
Severe, by recent EGD, secondary to large hiatal hernia .  Oral intake has been poorly tolerated since July 15 discharge until today . Continue sucralfate and ranitidine .advance diet slowly, avoid acidic liquids.

## 2015-04-06 NOTE — Assessment & Plan Note (Addendum)
Secondary to weight loss, poor nutrtional status and prolonged illness.  She needs to have an air overlay mattress to decrease the pressure on her sacral area.

## 2015-04-07 DIAGNOSIS — I272 Other secondary pulmonary hypertension: Secondary | ICD-10-CM | POA: Diagnosis not present

## 2015-04-07 DIAGNOSIS — D62 Acute posthemorrhagic anemia: Secondary | ICD-10-CM | POA: Diagnosis not present

## 2015-04-07 DIAGNOSIS — Z9181 History of falling: Secondary | ICD-10-CM | POA: Insufficient documentation

## 2015-04-07 DIAGNOSIS — M329 Systemic lupus erythematosus, unspecified: Secondary | ICD-10-CM | POA: Diagnosis not present

## 2015-04-07 DIAGNOSIS — Z48815 Encounter for surgical aftercare following surgery on the digestive system: Secondary | ICD-10-CM | POA: Diagnosis not present

## 2015-04-07 DIAGNOSIS — E119 Type 2 diabetes mellitus without complications: Secondary | ICD-10-CM | POA: Diagnosis not present

## 2015-04-07 DIAGNOSIS — Z431 Encounter for attention to gastrostomy: Secondary | ICD-10-CM | POA: Diagnosis not present

## 2015-04-07 NOTE — Assessment & Plan Note (Signed)
Occurred following discharge laslt weekedn, while getting up to void,  Bedside commode ordered

## 2015-04-07 NOTE — Assessment & Plan Note (Addendum)
She received 7 days of Levaquin .  Most recent CXR suggested that the LLL infiltrate was actually due to the large hiatal hernia .  She has excellent breath sounds and no egophony on  Exam.  No indication for continued antibiotics.

## 2015-04-08 ENCOUNTER — Telehealth: Payer: Self-pay | Admitting: Internal Medicine

## 2015-04-08 DIAGNOSIS — M329 Systemic lupus erythematosus, unspecified: Secondary | ICD-10-CM | POA: Diagnosis not present

## 2015-04-08 DIAGNOSIS — E119 Type 2 diabetes mellitus without complications: Secondary | ICD-10-CM | POA: Diagnosis not present

## 2015-04-08 DIAGNOSIS — Z431 Encounter for attention to gastrostomy: Secondary | ICD-10-CM | POA: Diagnosis not present

## 2015-04-08 DIAGNOSIS — M6281 Muscle weakness (generalized): Secondary | ICD-10-CM | POA: Insufficient documentation

## 2015-04-08 DIAGNOSIS — D62 Acute posthemorrhagic anemia: Secondary | ICD-10-CM | POA: Diagnosis not present

## 2015-04-08 DIAGNOSIS — Z48815 Encounter for surgical aftercare following surgery on the digestive system: Secondary | ICD-10-CM | POA: Diagnosis not present

## 2015-04-08 DIAGNOSIS — I272 Other secondary pulmonary hypertension: Secondary | ICD-10-CM | POA: Diagnosis not present

## 2015-04-08 NOTE — Discharge Summary (Signed)
Physician Discharge Summary  Patient ID: Diana [name] MRN: [id number] DOB/AGE: 1936 78 y.o.  Admit date: 03/10/2015 Discharge date: 04/08/2015  Admission Diagnoses: Gastric volvulus  Discharge Diagnoses:  Principal Problem:   Volvulus of stomach Active Problems:   Hypothyroidism   Systemic lupus erythematosus   Diabetes mellitus without complication   Pulmonary hypertension   Discharged Condition: Stable  Hospital Course: Patient presented with acute abdominal and chest pain. CT suggested gastric volvulus. No improvement with NG tube placement. Taken to the operating room the day of procedure 4 release of an adhesive band and decompression of the stomach. Partial reduction of the massive hiatal hernia. Incidental splenectomy secondary to hilar trauma and ongoing anticoagulation. Gastropexy completed with multiple sutures between the anterior lateral gastric wall and the anterior wall of the stomach as well as tube gastrostomy. Patient received Lovenox for DVT prevention as well as sequential compression stockings. Slow to ambulate in spite of maximum encouragement. No obvious cardiopulmonary dysfunction. Oral intake was less than ideal, but the patient felt she would do better at home.    Consults: None.  Significant Diagnostic Studies:  None      Discharge Exam: Normal cardiopulmonary exam. Soft abdomen. Midline wound healing well. No evidence of lower extremity edema or tenderness. Blood pressure 120/63, pulse 89, temperature 98.6 F (37 C), temperature source Oral, resp. rate 17, height 5\' 2"  (1.575 m), weight 141 lb 1.6 oz (64.003 kg), SpO2 92 %. General appearance: alert  Disposition: 01-Home or Self Care  Discharge Instructions    Diet general    Complete by:  As directed   Avoid breads/ dry meats. Drink plenty of fluids.     Discharge wound care:    Complete by:  As directed   Shower with assistance. Keep gastrostomy tube covered.     Increase activity  slowly    Complete by:  As directed             Medication List    STOP taking these medications        omeprazole 40 MG capsule  Commonly known as:  PRILOSEC      TAKE these medications        ADCIRCA 20 MG Tabs  Generic drug:  Tadalafil (PAH)  Take 40 mg by mouth daily.     ALPRAZolam 0.5 MG tablet  Commonly known as:  XANAX  Take 0.5 mg by mouth at bedtime as needed for anxiety.     budesonide-formoterol 160-4.5 MCG/ACT inhaler  Commonly known as:  SYMBICORT  Inhale 2 puffs into the lungs 2 (two) times daily.     CALCIUM + D PO  Take 1 tablet by mouth daily.     cyanocobalamin 1000 MCG/ML injection  Commonly known as:  (VITAMIN B-12)  Inject 1 mL (1,000 mcg total) into the muscle every 30 (thirty) days.     fluticasone 50 MCG/ACT nasal spray  Commonly known as:  FLONASE  Place 2 sprays into both nostrils daily as needed for rhinitis.     furosemide 20 MG tablet  Commonly known as:  LASIX  Take 20 mg by mouth daily.     HYDROcodone-acetaminophen 5-325 MG per tablet  Commonly known as:  NORCO/VICODIN  Take 1-2 tablets by mouth every 4 (four) hours as needed for moderate pain.     hydroxychloroquine 200 MG tablet  Commonly known as:  PLAQUENIL  Take 200 mg by mouth daily.     letrozole 2.5 MG tablet  Commonly known as:  FEMARA  Take 2.5 mg by mouth daily.     metoprolol succinate 25 MG 24 hr tablet  Commonly known as:  TOPROL-XL  Take 25 mg by mouth daily.     mycophenolate 500 MG tablet  Commonly known as:  CELLCEPT  Take 1,000 mg by mouth 2 (two) times daily.     ondansetron 4 MG disintegrating tablet  Commonly known as:  ZOFRAN-ODT  Take 1 tablet (4 mg total) by mouth every 4 (four) hours as needed for nausea or vomiting.     predniSONE 5 MG tablet  Commonly known as:  DELTASONE  Take 5 mg by mouth daily.     ranitidine 150 MG tablet  Commonly known as:  ZANTAC  Take 1 tablet (150 mg total) by mouth at bedtime.     SYNTHROID 150 MCG tablet   Generic drug:  levothyroxine  Take 1 tablet (150 mcg total) by mouth daily.     warfarin 5 MG tablet  Commonly known as:  COUMADIN  Take 1 tablet (5 mg total) by mouth daily.           Follow-up Information    Follow up with Lemar Livings Merrily Pew, MD In 5 days.   Specialties:  General Surgery, Radiology   Why:  July 5 at 11 AM.    Contact information:   [address] [place] [address] [phone]      The importance of maintaining adequate hydration was emphasized. 1 L per day plus solid foods at a minimum. Frequent ambulation and use of incentive spirometry encouraged. Signed: Earline Mayotte 04/08/2015, 9:29 AM

## 2015-04-08 NOTE — Telephone Encounter (Signed)
Faxed audited notes to [facility] att: Melissa as requested.

## 2015-04-08 NOTE — Assessment & Plan Note (Signed)
She is having difficulty getting out of bed due to recent abdominal surgery and muscle weakness,  And needs to have the head of her bed raised  Due to her large hiatal hernia causing reflux  .  She needs an electric hospital  bed to manage her reflux and truncal weakness.

## 2015-04-09 DIAGNOSIS — M329 Systemic lupus erythematosus, unspecified: Secondary | ICD-10-CM | POA: Diagnosis not present

## 2015-04-09 DIAGNOSIS — E119 Type 2 diabetes mellitus without complications: Secondary | ICD-10-CM | POA: Diagnosis not present

## 2015-04-09 DIAGNOSIS — I272 Other secondary pulmonary hypertension: Secondary | ICD-10-CM | POA: Diagnosis not present

## 2015-04-09 DIAGNOSIS — D62 Acute posthemorrhagic anemia: Secondary | ICD-10-CM | POA: Diagnosis not present

## 2015-04-09 DIAGNOSIS — J189 Pneumonia, unspecified organism: Secondary | ICD-10-CM | POA: Diagnosis not present

## 2015-04-09 DIAGNOSIS — N189 Chronic kidney disease, unspecified: Secondary | ICD-10-CM | POA: Diagnosis not present

## 2015-04-09 DIAGNOSIS — Z431 Encounter for attention to gastrostomy: Secondary | ICD-10-CM | POA: Diagnosis not present

## 2015-04-09 DIAGNOSIS — Z48815 Encounter for surgical aftercare following surgery on the digestive system: Secondary | ICD-10-CM | POA: Diagnosis not present

## 2015-04-09 NOTE — Discharge Summary (Signed)
Physician Discharge Summary  Patient ID: Diana [name] MRN: [id number] DOB/AGE: 12-04-1934 80 y.o.  Admit date: 03/26/2015 Discharge date: 04/09/2015  Admission Diagnoses:  Discharge Diagnoses:  Active Problems:   Pneumonia   Esophagitis   Discharged Condition: fair  Hospital Course: The patient was admitted with dehydration and reports of dysphasia and nausea. Upper endoscopy showed significant esophagitis. No malrotation of the stomach. No oral obstruction. She was placed on Carafate as well as Diflucan with improvement in her dysphasia. Prior to discharge she was able to drink at least 1 L of fluids daily and eat soft diet without pain.  Consults: GI and Internal medicine  Significant Diagnostic Studies: labs: Notable finding was a rise in the serum creatinine which responded to fluid administration.  Treatments:  None  Discharge Exam: Blood pressure 110/53, pulse 92, temperature 99.2 F (37.3 C), temperature source Oral, resp. rate 16, height 5\' 2"  (1.575 m), weight 158 lb 12.8 oz (72.031 kg), SpO2 96 %. General appearance: alert  Pulmonary exam remain clear. Cardiac exam showed a regular rhythm. The abdomen was soft and nontender. Midline wound healing well. No evidence of lower extremity calf edema or tenderness. Bilateral lower extremity ultrasound was negative.  Disposition: 01-Home or Self Care  Discharge Instructions    Diet general    Complete by:  As directed      Discharge instructions    Complete by:  As directed   No exertional activity. May shower            Medication List    STOP taking these medications        enoxaparin 60 MG/0.6ML injection  Commonly known as:  LOVENOX     LORazepam 2 MG/ML injection  Commonly known as:  ATIVAN     metoCLOPramide 5 MG tablet  Commonly known as:  REGLAN     omeprazole 40 MG capsule  Commonly known as:  PRILOSEC      TAKE these medications        ADCIRCA 20 MG Tabs  Generic drug:  Tadalafil (PAH)   Take 40 mg by mouth daily.     ALPRAZolam 0.5 MG tablet  Commonly known as:  XANAX  Take 0.5 mg by mouth at bedtime as needed for anxiety.     budesonide-formoterol 160-4.5 MCG/ACT inhaler  Commonly known as:  SYMBICORT  Inhale 2 puffs into the lungs 2 (two) times daily.     CALCIUM + D PO  Take 1 tablet by mouth daily.     cyanocobalamin 1000 MCG/ML injection  Commonly known as:  (VITAMIN B-12)  Inject 1 mL (1,000 mcg total) into the muscle every 30 (thirty) days.     diphenhydrAMINE 50 MG tablet  Commonly known as:  BENADRYL  Take 1 tablet (50 mg total) by mouth once. Take 50 mg Benadryl 1 hour prior to CT scan     fluconazole 40 MG/ML suspension  Commonly known as:  DIFLUCAN  Take 2.5 mLs (100 mg total) by mouth daily.     fluticasone 50 MCG/ACT nasal spray  Commonly known as:  FLONASE  Place 2 sprays into both nostrils daily as needed for rhinitis.     furosemide 20 MG tablet  Commonly known as:  LASIX  Take 20 mg by mouth daily.     HYDROcodone-acetaminophen 5-325 MG per tablet  Commonly known as:  NORCO/VICODIN  Take 1-2 tablets by mouth every 4 (four) hours as needed for moderate pain.     hydroxychloroquine 200  MG tablet  Commonly known as:  PLAQUENIL  Take 200 mg by mouth daily.     letrozole 2.5 MG tablet  Commonly known as:  FEMARA  Take 2.5 mg by mouth daily.     levofloxacin 250 MG tablet  Commonly known as:  LEVAQUIN  Take 1 tablet (250 mg total) by mouth daily.     metoprolol succinate 25 MG 24 hr tablet  Commonly known as:  TOPROL-XL  Take 25 mg by mouth daily.     mycophenolate 500 MG tablet  Commonly known as:  CELLCEPT  Take 1,000 mg by mouth 2 (two) times daily.     ondansetron 4 MG disintegrating tablet  Commonly known as:  ZOFRAN-ODT  Take 1 tablet (4 mg total) by mouth every 4 (four) hours as needed for nausea or vomiting.     predniSONE 5 MG tablet  Commonly known as:  DELTASONE  Take 5 mg by mouth daily.     ranitidine 150 MG  tablet  Commonly known as:  ZANTAC  Take 1 tablet (150 mg total) by mouth at bedtime.     sucralfate 1 GM/10ML suspension  Commonly known as:  CARAFATE  Take 10 mLs (1 g total) by mouth 4 (four) times daily -  with meals and at bedtime.     SYNTHROID 150 MCG tablet  Generic drug:  levothyroxine  Take 1 tablet (150 mcg total) by mouth daily.     warfarin 5 MG tablet  Commonly known as:  COUMADIN  Take 1 tablet (5 mg total) by mouth daily.           Follow-up Information    Follow up with Lemar Livings Merrily Pew, MD. Go on 04/13/2015.   Specialties:  General Surgery, Radiology   Why:  post op check- 10am on 04/13/15   Contact information:   [address] [place] [address] [phone]       Signed: Earline Mayotte 04/09/2015, 9:59 AM

## 2015-04-13 ENCOUNTER — Ambulatory Visit
Admission: RE | Admit: 2015-04-13 | Discharge: 2015-04-13 | Disposition: A | Discharge status: Home / Self Care | Payer: Medicare Other | Source: Ambulatory Visit | Attending: General Surgery | Admitting: General Surgery

## 2015-04-13 ENCOUNTER — Other Ambulatory Visit: Payer: Self-pay | Admitting: *Deleted

## 2015-04-13 ENCOUNTER — Encounter: Payer: Self-pay | Admitting: General Surgery

## 2015-04-13 ENCOUNTER — Ambulatory Visit: Payer: Medicare Other | Admitting: General Surgery

## 2015-04-13 VITALS — BP 112/55 | HR 90 | Temp 99.2°F | Resp 18 | Wt 129.0 lb

## 2015-04-13 VITALS — BP 108/60 | HR 97 | Resp 14 | Ht 64.0 in | Wt 128.4 lb

## 2015-04-13 DIAGNOSIS — K449 Diaphragmatic hernia without obstruction or gangrene: Secondary | ICD-10-CM | POA: Diagnosis not present

## 2015-04-13 DIAGNOSIS — Z882 Allergy status to sulfonamides status: Secondary | ICD-10-CM | POA: Insufficient documentation

## 2015-04-13 DIAGNOSIS — Z9081 Acquired absence of spleen: Secondary | ICD-10-CM | POA: Insufficient documentation

## 2015-04-13 DIAGNOSIS — Z91041 Radiographic dye allergy status: Secondary | ICD-10-CM | POA: Diagnosis not present

## 2015-04-13 DIAGNOSIS — Z79899 Other long term (current) drug therapy: Secondary | ICD-10-CM | POA: Diagnosis not present

## 2015-04-13 DIAGNOSIS — I272 Other secondary pulmonary hypertension: Secondary | ICD-10-CM | POA: Diagnosis not present

## 2015-04-13 DIAGNOSIS — Z86718 Personal history of other venous thrombosis and embolism: Secondary | ICD-10-CM | POA: Diagnosis not present

## 2015-04-13 DIAGNOSIS — Z888 Allergy status to other drugs, medicaments and biological substances status: Secondary | ICD-10-CM | POA: Diagnosis not present

## 2015-04-13 DIAGNOSIS — Z87891 Personal history of nicotine dependence: Secondary | ICD-10-CM | POA: Insufficient documentation

## 2015-04-13 DIAGNOSIS — M329 Systemic lupus erythematosus, unspecified: Secondary | ICD-10-CM | POA: Diagnosis not present

## 2015-04-13 DIAGNOSIS — R110 Nausea: Secondary | ICD-10-CM | POA: Diagnosis not present

## 2015-04-13 DIAGNOSIS — K3189 Other diseases of stomach and duodenum: Secondary | ICD-10-CM

## 2015-04-13 DIAGNOSIS — E039 Hypothyroidism, unspecified: Secondary | ICD-10-CM | POA: Insufficient documentation

## 2015-04-13 DIAGNOSIS — Z853 Personal history of malignant neoplasm of breast: Secondary | ICD-10-CM | POA: Insufficient documentation

## 2015-04-13 DIAGNOSIS — R197 Diarrhea, unspecified: Secondary | ICD-10-CM | POA: Diagnosis not present

## 2015-04-13 DIAGNOSIS — Z881 Allergy status to other antibiotic agents status: Secondary | ICD-10-CM | POA: Insufficient documentation

## 2015-04-13 DIAGNOSIS — R112 Nausea with vomiting, unspecified: Secondary | ICD-10-CM

## 2015-04-13 DIAGNOSIS — E860 Dehydration: Secondary | ICD-10-CM | POA: Insufficient documentation

## 2015-04-13 LAB — COMPREHENSIVE METABOLIC PANEL
ALBUMIN: 2.7 g/dL — AB (ref 3.5–5.0)
ALT: 8 U/L — ABNORMAL LOW (ref 14–54)
AST: 19 U/L (ref 15–41)
Alkaline Phosphatase: 76 U/L (ref 38–126)
Anion gap: 10 (ref 5–15)
BUN: 31 mg/dL — ABNORMAL HIGH (ref 6–20)
CALCIUM: 8.4 mg/dL — AB (ref 8.9–10.3)
CO2: 21 mmol/L — AB (ref 22–32)
Chloride: 109 mmol/L (ref 101–111)
Creatinine, Ser: 1.77 mg/dL — ABNORMAL HIGH (ref 0.44–1.00)
GFR, EST AFRICAN AMERICAN: 31 mL/min — AB (ref >60)
GFR, EST NON AFRICAN AMERICAN: 26 mL/min — AB (ref >60)
Glucose, Bld: 133 mg/dL — ABNORMAL HIGH (ref 65–99)
Potassium: 3.3 mmol/L — ABNORMAL LOW (ref 3.5–5.1)
Sodium: 140 mmol/L (ref 135–145)
Total Bilirubin: 0.4 mg/dL (ref 0.3–1.2)
Total Protein: 5.9 g/dL — ABNORMAL LOW (ref 6.5–8.1)

## 2015-04-13 LAB — CBC WITH DIFFERENTIAL/PLATELET
Basophils Absolute: 0.1 10*3/uL (ref 0–0.1)
Basophils Relative: 1 %
Eosinophils Absolute: 0.1 10*3/uL (ref 0–0.7)
Eosinophils Relative: 1 %
HEMATOCRIT: 34.1 % — AB (ref 35.0–47.0)
HEMOGLOBIN: 10.8 g/dL — AB (ref 12.0–16.0)
LYMPHS PCT: 9 %
Lymphs Abs: 0.6 10*3/uL — ABNORMAL LOW (ref 1.0–3.6)
MCH: 27.9 pg (ref 26.0–34.0)
MCHC: 31.7 g/dL — ABNORMAL LOW (ref 32.0–36.0)
MCV: 88.1 fL (ref 80.0–100.0)
Monocytes Absolute: 0.6 10*3/uL (ref 0.2–0.9)
Monocytes Relative: 9 %
Neutro Abs: 5.5 10*3/uL (ref 1.4–6.5)
Neutrophils Relative %: 80 %
Platelets: 513 10*3/uL — ABNORMAL HIGH (ref 150–440)
RBC: 3.87 MIL/uL (ref 3.80–5.20)
RDW: 16.7 % — ABNORMAL HIGH (ref 11.5–14.5)
WBC: 6.9 10*3/uL (ref 3.6–11.0)

## 2015-04-13 LAB — PROTIME-INR: INR: >10

## 2015-04-13 MED ORDER — DEXTROSE 5 % IN LACTATED RINGERS IV BOLUS
2000.0000 mL | Freq: Once | INTRAVENOUS | Status: AC
  Administered 2015-04-13: 2000 mL via INTRAVENOUS

## 2015-04-13 MED ORDER — PROMETHAZINE HCL 25 MG PO TABS
12.5000 mg | ORAL_TABLET | ORAL | Status: DC | PRN
  Administered 2015-04-13 (×2): 12.5 mg via ORAL

## 2015-04-13 MED ORDER — PHYTONADIONE 5 MG PO TABS
10.0000 mg | ORAL_TABLET | Freq: Once | ORAL | Status: AC
  Administered 2015-04-13: 10 mg via ORAL
  Filled 2015-04-13: qty 2

## 2015-04-13 NOTE — Progress Notes (Signed)
Patient has been scheduled for a gallbladder ultrasound and upper GI at [facility] for Tuesday, 04-14-15 at 10:30 am (arrive 10:15 am). Prep: NPO after midnight.   Melanie in Same Day Surgery has been notified of this and will relay information to patient since she is getting fluids today.

## 2015-04-13 NOTE — Discharge Instructions (Signed)

## 2015-04-13 NOTE — Progress Notes (Signed)
CRITICAL VALUE ALERT  Critical value received:  PT >90 , INR >12  Date of notification:  04/13/15  Time of notification:  [id number]  Critical value read back:Yes.    Nurse who received alert:  Myra Gianotti RN  MD notified (1st page):  Dr. Bary Castilla   Time of first page:  1444  MD notified (2nd page):  Time of second page:  Responding MD:  Bary Castilla  Time MD responded:  [id number]  New orders received

## 2015-04-13 NOTE — Progress Notes (Signed)
Patient ID: Diana [name], female   DOB: 02-Feb-1935, 80 y.o.   MRN: [id number]  Chief Complaint  Patient presents with  . Routine Post Op    gastropexy and slenectomy    HPI Diana [name] is a 80 y.o. female here today for her post op gastropexy and splenectomy done on 03/26/15. She has been having trouble with eating since surgery. She has been mostly drinking liquids. Saturday night she was able to eat some steak, a baked potato, and some jello. She has had diarrhea that was pudding like for the past 3 days. Only 2 bowel movements per day during these episodes. She reports that the stool is green first thing in the morning, then turns brown. She denies any fevers. She reports that she is constantly nauseated. She does make use of Phenergan with relief of her nausea but the accompanying sedation has limited some of her activity. She has had some vomiting but now she has only been having dry heaving. She has been feeling very weak.  She reports that she has been walking around the house, and did not volunteer that nor admit to any lightheadedness with change in position.  In the days prior to her most recent discharge mid July, she had been able take over 1.2 L per day without significant difficulty.   HPI  Past Medical History  Diagnosis Date  . Lupus (systemic lupus erythematosus)   . Pulmonary hypertension   . Pneumonia   . Unspecified menopausal and postmenopausal disorder   . Primary pulmonary hypertension   . Acute glomerulonephritis with other specified pathological lesion in kidney in disease classified elsewhere(580.81)   . Unspecified hypothyroidism   . Shingles (herpes zoster) polyneuropathy March 2013    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  . Hiatal hernia   . DVT (deep venous thrombosis)   . Cancer     Breast  . CHF (congestive heart failure)     Past Surgical History  Procedure Laterality Date  . Kyphoplasty N/A 02/10/2015    Procedure: [id number];  Surgeon:  Kennedy Bucker, MD;  Location: [facility] ORS;  Service: Orthopedics;  Laterality: N/A;  . Laparotomy N/A 03/11/2015    Procedure: REDUCTION OF GASTRIC VOLVULUS, SPLEENECTOMY, GASTRIC TUBE PLACEMENT;  Surgeon: Earline Mayotte, MD;  Location: [facility] ORS;  Service: General;  Laterality: N/A;  . Esophagogastroduodenoscopy (egd) with propofol N/A 03/28/2015    Procedure: ESOPHAGOGASTRODUODENOSCOPY (EGD) WITH PROPOFOL;  Surgeon: Earline Mayotte, MD;  Location: [facility] ENDOSCOPY;  Service: Endoscopy;  Laterality: N/A;    No family history on file.  Social History History  Substance Use Topics  . Smoking status: Former Smoker    Quit date: 05/25/1991  . Smokeless tobacco: Never Used  . Alcohol Use: Yes     Comment: rare    Allergies  Allergen Reactions  . Amoxicillin Other (See Comments)    Reaction:  Stomach cramps   . Cefuroxime Itching  . Contrast Media [Iodinated Diagnostic Agents] Hives  . Cephalexin Rash  . Sulfonamide Derivatives Rash  . Tramadol Hcl Rash  . Venlafaxine Rash    Current Outpatient Prescriptions  Medication Sig Dispense Refill  . ALPRAZolam (XANAX) 0.5 MG tablet Take 0.5 mg by mouth at bedtime as needed for anxiety.    . budesonide-formoterol (SYMBICORT) 160-4.5 MCG/ACT inhaler Inhale 2 puffs into the lungs 2 (two) times daily.    . Calcium Carbonate-Vitamin D (CALCIUM + D PO) Take 1 tablet by mouth daily.    . cyanocobalamin (,VITAMIN B-12,) 1000  MCG/ML injection Inject 1 mL (1,000 mcg total) into the muscle every 30 (thirty) days. 10 mL 1  . diphenhydrAMINE (BENADRYL) 50 MG tablet Take 1 tablet (50 mg total) by mouth once. Take 50 mg Benadryl 1 hour prior to CT scan (Patient not taking: Reported on 04/13/2015) 1 tablet 0  . fluconazole (DIFLUCAN) 40 MG/ML suspension Take 2.5 mLs (100 mg total) by mouth daily. 35 mL 0  . fluticasone (FLONASE) 50 MCG/ACT nasal spray Place 2 sprays into both nostrils daily as needed for rhinitis.    . furosemide (LASIX) 20 MG tablet Take 20  mg by mouth daily.    Marland Kitchen HYDROcodone-acetaminophen (NORCO/VICODIN) 5-325 MG per tablet Take 1-2 tablets by mouth every 4 (four) hours as needed for moderate pain. 30 tablet 0  . hydroxychloroquine (PLAQUENIL) 200 MG tablet Take 200 mg by mouth daily.    Marland Kitchen letrozole (FEMARA) 2.5 MG tablet Take 2.5 mg by mouth daily.    . metoprolol succinate (TOPROL-XL) 25 MG 24 hr tablet Take 25 mg by mouth daily.    . mycophenolate (CELLCEPT) 500 MG tablet Take 1,000 mg by mouth 2 (two) times daily.     . ondansetron (ZOFRAN-ODT) 4 MG disintegrating tablet Take 1 tablet (4 mg total) by mouth every 4 (four) hours as needed for nausea or vomiting. (Patient not taking: Reported on 04/13/2015) 20 tablet 0  . predniSONE (DELTASONE) 5 MG tablet Take 5 mg by mouth daily.    . ranitidine (ZANTAC) 150 MG tablet Take 1 tablet (150 mg total) by mouth at bedtime. 90 tablet 2  . sucralfate (CARAFATE) 1 GM/10ML suspension Take 10 mLs (1 g total) by mouth 4 (four) times daily -  with meals and at bedtime. 420 mL 0  . SYNTHROID 150 MCG tablet Take 1 tablet (150 mcg total) by mouth daily. 30 tablet 11  . Tadalafil, PAH, (ADCIRCA) 20 MG TABS Take 40 mg by mouth daily.    Marland Kitchen warfarin (COUMADIN) 5 MG tablet Take 1 tablet (5 mg total) by mouth daily. 30 tablet 3  . promethazine (PHENERGAN) 12.5 MG tablet Take 12.5 mg by mouth every 6 (six) hours as needed for nausea or vomiting.     No current facility-administered medications for this visit.   Facility-Administered Medications Ordered in Other Visits  Medication Dose Route Frequency Provider Last Rate Last Dose  . dextrose 5% lactated ringers bolus 2,000 mL  2,000 mL Intravenous Once Earline Mayotte, MD   2,000 mL at 04/13/15 1230  . promethazine (PHENERGAN) tablet 12.5 mg  12.5 mg Oral Q4H PRN Earline Mayotte, MD   12.5 mg at 04/13/15 1245    Review of Systems Review of Systems  Constitutional: Positive for fatigue and unexpected weight change (continued weight loss  secondary to oral intake.). Negative for fever, chills, diaphoresis, activity change and appetite change.  Respiratory: Negative.   Cardiovascular: Negative.   Gastrointestinal: Positive for nausea, vomiting and diarrhea. Negative for abdominal pain, constipation, blood in stool, abdominal distention, anal bleeding and rectal pain.  Neurological: Negative for dizziness and light-headedness.    Blood pressure 108/60, pulse 97, resp. rate 14, height 5\' 4"  (1.626 m), weight 128 lb 6.4 oz (58.242 kg).  Physical Exam Physical Exam  Constitutional: She is oriented to person, place, and time.  Eyes: Conjunctivae are normal. No scleral icterus.  Neck: Neck supple.  Cardiovascular: Normal rate, regular rhythm and normal heart sounds.   Pulmonary/Chest: Effort normal.  Abdominal: Soft. Bowel sounds are normal. There  is tenderness (little tender left upper quadrant).  Lymphadenopathy:    She has no cervical adenopathy.  Neurological: She is alert and oriented to person, place, and time.  Skin: Skin is warm and dry.    Data Reviewed The patient's weight in March 2016 was 154 pounds. Last week at her PCP office her weight was 134 pounds, today's weight 128 pounds.  Postural vital signs were obtained. Resting pulse 97, blood pressure 118/64. Seated pulse 98, blood pressure 105/60; standing pulse 112, blood pressure 101/62 (asymptomatic).  Assessment    Progressive weight loss.  Unremitting nausea and vomiting of unclear etiology.  Resolution of previous dysphagia with treatment of esophagitis.  Postural hypotension.    Plan    The patient shows no evidence of cardiopulmonary dysfunction. And for that reason we'll plan for IV hydration this afternoon as an outpatient. (The patient was adamant about not being admitted the hospital) sees.  IV is unlikely cholecystitis without any abdominal symptoms besides nausea is present, but we will arrange for an abdominal ultrasound to more. We will  repeat her upper GI to see if there is some dysfunction not evident on her previous, recent EGD of July 9.  I've spoken with Gwenith Daily, M.D. in the GI surgery Department at [facility]. Arrangements have been made for an outpatient evaluation tomorrow afternoon at 2 PM with Dr. Kateri Mc at the GI surgery clinic at [facility].    PCP: Delene Ruffini 04/13/2015, 2:00 PM

## 2015-04-13 NOTE — Progress Notes (Signed)
Diana Petrin RN spoke with Dr. Bary Castilla once again. He ordered to hold coumadin for tonight. Confirmed to give vitamin K PO. He stated he will see patient again prior to her discharge today after IV therapy and Medical doctor, Dr. Derrel Nip not needed to be notified at this time.

## 2015-04-14 ENCOUNTER — Ambulatory Visit
Admission: RE | Admit: 2015-04-14 | Discharge: 2015-04-14 | Disposition: A | Discharge status: Home / Self Care | Payer: Medicare Other | Source: Ambulatory Visit | Attending: General Surgery | Admitting: General Surgery

## 2015-04-14 DIAGNOSIS — K449 Diaphragmatic hernia without obstruction or gangrene: Secondary | ICD-10-CM | POA: Diagnosis not present

## 2015-04-14 DIAGNOSIS — K30 Functional dyspepsia: Secondary | ICD-10-CM | POA: Diagnosis present

## 2015-04-14 DIAGNOSIS — R633 Feeding difficulties: Secondary | ICD-10-CM | POA: Diagnosis not present

## 2015-04-14 DIAGNOSIS — J329 Chronic sinusitis, unspecified: Secondary | ICD-10-CM | POA: Diagnosis present

## 2015-04-14 DIAGNOSIS — Z7952 Long term (current) use of systemic steroids: Secondary | ICD-10-CM | POA: Diagnosis not present

## 2015-04-14 DIAGNOSIS — R279 Unspecified lack of coordination: Secondary | ICD-10-CM | POA: Diagnosis not present

## 2015-04-14 DIAGNOSIS — Z880 Allergy status to penicillin: Secondary | ICD-10-CM | POA: Diagnosis not present

## 2015-04-14 DIAGNOSIS — K228 Other specified diseases of esophagus: Secondary | ICD-10-CM | POA: Diagnosis not present

## 2015-04-14 DIAGNOSIS — I519 Heart disease, unspecified: Secondary | ICD-10-CM | POA: Diagnosis not present

## 2015-04-14 DIAGNOSIS — R000 Tachycardia, unspecified: Secondary | ICD-10-CM | POA: Diagnosis not present

## 2015-04-14 DIAGNOSIS — R112 Nausea with vomiting, unspecified: Secondary | ICD-10-CM | POA: Diagnosis not present

## 2015-04-14 DIAGNOSIS — Z882 Allergy status to sulfonamides status: Secondary | ICD-10-CM | POA: Diagnosis not present

## 2015-04-14 DIAGNOSIS — R627 Adult failure to thrive: Secondary | ICD-10-CM | POA: Diagnosis not present

## 2015-04-14 DIAGNOSIS — Z7901 Long term (current) use of anticoagulants: Secondary | ICD-10-CM | POA: Diagnosis not present

## 2015-04-14 DIAGNOSIS — R2681 Unsteadiness on feet: Secondary | ICD-10-CM | POA: Diagnosis not present

## 2015-04-14 DIAGNOSIS — R1310 Dysphagia, unspecified: Secondary | ICD-10-CM | POA: Diagnosis not present

## 2015-04-14 DIAGNOSIS — Z853 Personal history of malignant neoplasm of breast: Secondary | ICD-10-CM | POA: Diagnosis not present

## 2015-04-14 DIAGNOSIS — R5383 Other fatigue: Secondary | ICD-10-CM | POA: Diagnosis not present

## 2015-04-14 DIAGNOSIS — Z48815 Encounter for surgical aftercare following surgery on the digestive system: Secondary | ICD-10-CM | POA: Diagnosis not present

## 2015-04-14 DIAGNOSIS — K224 Dyskinesia of esophagus: Secondary | ICD-10-CM | POA: Diagnosis not present

## 2015-04-14 DIAGNOSIS — Z79891 Long term (current) use of opiate analgesic: Secondary | ICD-10-CM | POA: Diagnosis not present

## 2015-04-14 DIAGNOSIS — J309 Allergic rhinitis, unspecified: Secondary | ICD-10-CM | POA: Diagnosis not present

## 2015-04-14 DIAGNOSIS — E43 Unspecified severe protein-calorie malnutrition: Secondary | ICD-10-CM | POA: Diagnosis present

## 2015-04-14 DIAGNOSIS — R110 Nausea: Secondary | ICD-10-CM | POA: Diagnosis not present

## 2015-04-14 DIAGNOSIS — M6281 Muscle weakness (generalized): Secondary | ICD-10-CM | POA: Diagnosis not present

## 2015-04-14 DIAGNOSIS — Z7951 Long term (current) use of inhaled steroids: Secondary | ICD-10-CM | POA: Diagnosis not present

## 2015-04-14 DIAGNOSIS — R933 Abnormal findings on diagnostic imaging of other parts of digestive tract: Secondary | ICD-10-CM | POA: Diagnosis not present

## 2015-04-14 DIAGNOSIS — E039 Hypothyroidism, unspecified: Secondary | ICD-10-CM | POA: Diagnosis not present

## 2015-04-14 DIAGNOSIS — Z01818 Encounter for other preprocedural examination: Secondary | ICD-10-CM | POA: Diagnosis not present

## 2015-04-14 DIAGNOSIS — I272 Other secondary pulmonary hypertension: Secondary | ICD-10-CM | POA: Diagnosis not present

## 2015-04-14 DIAGNOSIS — M349 Systemic sclerosis, unspecified: Secondary | ICD-10-CM | POA: Diagnosis not present

## 2015-04-14 DIAGNOSIS — I10 Essential (primary) hypertension: Secondary | ICD-10-CM | POA: Diagnosis not present

## 2015-04-14 DIAGNOSIS — K210 Gastro-esophageal reflux disease with esophagitis: Secondary | ICD-10-CM | POA: Diagnosis present

## 2015-04-14 DIAGNOSIS — J449 Chronic obstructive pulmonary disease, unspecified: Secondary | ICD-10-CM | POA: Diagnosis not present

## 2015-04-14 DIAGNOSIS — R5381 Other malaise: Secondary | ICD-10-CM | POA: Diagnosis not present

## 2015-04-14 DIAGNOSIS — Z87891 Personal history of nicotine dependence: Secondary | ICD-10-CM | POA: Diagnosis not present

## 2015-04-14 DIAGNOSIS — Z881 Allergy status to other antibiotic agents status: Secondary | ICD-10-CM | POA: Diagnosis not present

## 2015-04-14 DIAGNOSIS — M3500 Sicca syndrome, unspecified: Secondary | ICD-10-CM | POA: Diagnosis present

## 2015-04-14 DIAGNOSIS — Z6823 Body mass index (BMI) 23.0-23.9, adult: Secondary | ICD-10-CM | POA: Diagnosis not present

## 2015-04-14 DIAGNOSIS — Z5181 Encounter for therapeutic drug level monitoring: Secondary | ICD-10-CM | POA: Diagnosis not present

## 2015-04-14 DIAGNOSIS — Z91041 Radiographic dye allergy status: Secondary | ICD-10-CM | POA: Diagnosis not present

## 2015-04-14 DIAGNOSIS — K3189 Other diseases of stomach and duodenum: Secondary | ICD-10-CM | POA: Diagnosis not present

## 2015-04-14 DIAGNOSIS — Z79811 Long term (current) use of aromatase inhibitors: Secondary | ICD-10-CM | POA: Diagnosis not present

## 2015-04-14 DIAGNOSIS — I499 Cardiac arrhythmia, unspecified: Secondary | ICD-10-CM | POA: Diagnosis not present

## 2015-04-14 DIAGNOSIS — J439 Emphysema, unspecified: Secondary | ICD-10-CM | POA: Diagnosis not present

## 2015-04-14 DIAGNOSIS — I4891 Unspecified atrial fibrillation: Secondary | ICD-10-CM | POA: Diagnosis not present

## 2015-04-14 DIAGNOSIS — E538 Deficiency of other specified B group vitamins: Secondary | ICD-10-CM | POA: Diagnosis not present

## 2015-04-14 DIAGNOSIS — Z888 Allergy status to other drugs, medicaments and biological substances status: Secondary | ICD-10-CM | POA: Diagnosis not present

## 2015-04-14 DIAGNOSIS — M329 Systemic lupus erythematosus, unspecified: Secondary | ICD-10-CM | POA: Diagnosis present

## 2015-04-14 DIAGNOSIS — I5030 Unspecified diastolic (congestive) heart failure: Secondary | ICD-10-CM | POA: Diagnosis present

## 2015-04-14 DIAGNOSIS — K219 Gastro-esophageal reflux disease without esophagitis: Secondary | ICD-10-CM | POA: Diagnosis not present

## 2015-04-14 DIAGNOSIS — N390 Urinary tract infection, site not specified: Secondary | ICD-10-CM | POA: Diagnosis not present

## 2015-04-14 DIAGNOSIS — Z431 Encounter for attention to gastrostomy: Secondary | ICD-10-CM | POA: Diagnosis not present

## 2015-04-14 DIAGNOSIS — Z79899 Other long term (current) drug therapy: Secondary | ICD-10-CM | POA: Diagnosis not present

## 2015-04-21 ENCOUNTER — Encounter
Admission: RE | Admit: 2015-04-21 | Discharge: 2015-04-21 | Disposition: A | Discharge status: Home / Self Care | Payer: Medicare Other | Source: Ambulatory Visit | Attending: Internal Medicine | Admitting: Internal Medicine

## 2015-04-21 ENCOUNTER — Ambulatory Visit: Payer: Medicare Other | Attending: Internal Medicine

## 2015-04-21 ENCOUNTER — Other Ambulatory Visit: Payer: Medicare Other

## 2015-04-23 DIAGNOSIS — E539 Vitamin B deficiency, unspecified: Secondary | ICD-10-CM | POA: Diagnosis not present

## 2015-04-23 DIAGNOSIS — Z9889 Other specified postprocedural states: Secondary | ICD-10-CM | POA: Diagnosis not present

## 2015-04-23 DIAGNOSIS — E876 Hypokalemia: Secondary | ICD-10-CM | POA: Diagnosis present

## 2015-04-23 DIAGNOSIS — K5790 Diverticulosis of intestine, part unspecified, without perforation or abscess without bleeding: Secondary | ICD-10-CM | POA: Diagnosis not present

## 2015-04-23 DIAGNOSIS — I519 Heart disease, unspecified: Secondary | ICD-10-CM | POA: Diagnosis not present

## 2015-04-23 DIAGNOSIS — I270 Primary pulmonary hypertension: Secondary | ICD-10-CM | POA: Diagnosis not present

## 2015-04-23 DIAGNOSIS — K2901 Acute gastritis with bleeding: Secondary | ICD-10-CM | POA: Diagnosis present

## 2015-04-23 DIAGNOSIS — Z79891 Long term (current) use of opiate analgesic: Secondary | ICD-10-CM | POA: Diagnosis not present

## 2015-04-23 DIAGNOSIS — D649 Anemia, unspecified: Secondary | ICD-10-CM | POA: Diagnosis not present

## 2015-04-23 DIAGNOSIS — E039 Hypothyroidism, unspecified: Secondary | ICD-10-CM | POA: Diagnosis present

## 2015-04-23 DIAGNOSIS — I272 Other secondary pulmonary hypertension: Secondary | ICD-10-CM | POA: Diagnosis not present

## 2015-04-23 DIAGNOSIS — E870 Hyperosmolality and hypernatremia: Secondary | ICD-10-CM | POA: Diagnosis present

## 2015-04-23 DIAGNOSIS — N390 Urinary tract infection, site not specified: Secondary | ICD-10-CM | POA: Diagnosis not present

## 2015-04-23 DIAGNOSIS — R5383 Other fatigue: Secondary | ICD-10-CM | POA: Diagnosis not present

## 2015-04-23 DIAGNOSIS — B3781 Candidal esophagitis: Secondary | ICD-10-CM | POA: Diagnosis present

## 2015-04-23 DIAGNOSIS — K922 Gastrointestinal hemorrhage, unspecified: Secondary | ICD-10-CM | POA: Diagnosis not present

## 2015-04-23 DIAGNOSIS — R339 Retention of urine, unspecified: Secondary | ICD-10-CM | POA: Diagnosis not present

## 2015-04-23 DIAGNOSIS — Z431 Encounter for attention to gastrostomy: Secondary | ICD-10-CM | POA: Diagnosis not present

## 2015-04-23 DIAGNOSIS — E119 Type 2 diabetes mellitus without complications: Secondary | ICD-10-CM | POA: Diagnosis not present

## 2015-04-23 DIAGNOSIS — I4891 Unspecified atrial fibrillation: Secondary | ICD-10-CM | POA: Diagnosis not present

## 2015-04-23 DIAGNOSIS — R109 Unspecified abdominal pain: Secondary | ICD-10-CM | POA: Diagnosis not present

## 2015-04-23 DIAGNOSIS — L930 Discoid lupus erythematosus: Secondary | ICD-10-CM | POA: Diagnosis present

## 2015-04-23 DIAGNOSIS — Z5181 Encounter for therapeutic drug level monitoring: Secondary | ICD-10-CM | POA: Diagnosis not present

## 2015-04-23 DIAGNOSIS — B370 Candidal stomatitis: Secondary | ICD-10-CM | POA: Diagnosis present

## 2015-04-23 DIAGNOSIS — R309 Painful micturition, unspecified: Secondary | ICD-10-CM | POA: Diagnosis not present

## 2015-04-23 DIAGNOSIS — K210 Gastro-esophageal reflux disease with esophagitis: Secondary | ICD-10-CM | POA: Diagnosis present

## 2015-04-23 DIAGNOSIS — E860 Dehydration: Secondary | ICD-10-CM | POA: Diagnosis present

## 2015-04-23 DIAGNOSIS — Z853 Personal history of malignant neoplasm of breast: Secondary | ICD-10-CM | POA: Diagnosis not present

## 2015-04-23 DIAGNOSIS — R112 Nausea with vomiting, unspecified: Secondary | ICD-10-CM | POA: Diagnosis not present

## 2015-04-23 DIAGNOSIS — K625 Hemorrhage of anus and rectum: Secondary | ICD-10-CM | POA: Diagnosis not present

## 2015-04-23 DIAGNOSIS — Z48815 Encounter for surgical aftercare following surgery on the digestive system: Secondary | ICD-10-CM | POA: Diagnosis not present

## 2015-04-23 DIAGNOSIS — Z7951 Long term (current) use of inhaled steroids: Secondary | ICD-10-CM | POA: Diagnosis not present

## 2015-04-23 DIAGNOSIS — R633 Feeding difficulties: Secondary | ICD-10-CM | POA: Diagnosis not present

## 2015-04-23 DIAGNOSIS — Z931 Gastrostomy status: Secondary | ICD-10-CM | POA: Diagnosis not present

## 2015-04-23 DIAGNOSIS — Z7952 Long term (current) use of systemic steroids: Secondary | ICD-10-CM | POA: Diagnosis not present

## 2015-04-23 DIAGNOSIS — I509 Heart failure, unspecified: Secondary | ICD-10-CM | POA: Diagnosis present

## 2015-04-23 DIAGNOSIS — M545 Low back pain: Secondary | ICD-10-CM | POA: Diagnosis not present

## 2015-04-23 DIAGNOSIS — K224 Dyskinesia of esophagus: Secondary | ICD-10-CM | POA: Diagnosis not present

## 2015-04-23 DIAGNOSIS — R279 Unspecified lack of coordination: Secondary | ICD-10-CM | POA: Diagnosis not present

## 2015-04-23 DIAGNOSIS — K5730 Diverticulosis of large intestine without perforation or abscess without bleeding: Secondary | ICD-10-CM | POA: Diagnosis present

## 2015-04-23 DIAGNOSIS — M8589 Other specified disorders of bone density and structure, multiple sites: Secondary | ICD-10-CM | POA: Diagnosis not present

## 2015-04-23 DIAGNOSIS — I5181 Takotsubo syndrome: Secondary | ICD-10-CM | POA: Diagnosis not present

## 2015-04-23 DIAGNOSIS — I10 Essential (primary) hypertension: Secondary | ICD-10-CM | POA: Diagnosis not present

## 2015-04-23 DIAGNOSIS — R5381 Other malaise: Secondary | ICD-10-CM | POA: Diagnosis not present

## 2015-04-23 DIAGNOSIS — R0689 Other abnormalities of breathing: Secondary | ICD-10-CM | POA: Diagnosis not present

## 2015-04-23 DIAGNOSIS — I825Z1 Chronic embolism and thrombosis of unspecified deep veins of right distal lower extremity: Secondary | ICD-10-CM | POA: Diagnosis present

## 2015-04-23 DIAGNOSIS — R312 Other microscopic hematuria: Secondary | ICD-10-CM | POA: Diagnosis not present

## 2015-04-23 DIAGNOSIS — N958 Other specified menopausal and perimenopausal disorders: Secondary | ICD-10-CM | POA: Diagnosis present

## 2015-04-23 DIAGNOSIS — M3500 Sicca syndrome, unspecified: Secondary | ICD-10-CM | POA: Diagnosis not present

## 2015-04-23 DIAGNOSIS — M546 Pain in thoracic spine: Secondary | ICD-10-CM | POA: Diagnosis not present

## 2015-04-23 DIAGNOSIS — M329 Systemic lupus erythematosus, unspecified: Secondary | ICD-10-CM | POA: Diagnosis not present

## 2015-04-23 DIAGNOSIS — M810 Age-related osteoporosis without current pathological fracture: Secondary | ICD-10-CM | POA: Diagnosis not present

## 2015-04-23 DIAGNOSIS — K449 Diaphragmatic hernia without obstruction or gangrene: Secondary | ICD-10-CM | POA: Diagnosis present

## 2015-04-23 DIAGNOSIS — K3189 Other diseases of stomach and duodenum: Secondary | ICD-10-CM | POA: Diagnosis not present

## 2015-04-23 DIAGNOSIS — R918 Other nonspecific abnormal finding of lung field: Secondary | ICD-10-CM | POA: Diagnosis not present

## 2015-04-23 DIAGNOSIS — Z79899 Other long term (current) drug therapy: Secondary | ICD-10-CM | POA: Diagnosis not present

## 2015-04-23 DIAGNOSIS — M4185 Other forms of scoliosis, thoracolumbar region: Secondary | ICD-10-CM | POA: Diagnosis not present

## 2015-04-23 DIAGNOSIS — Z881 Allergy status to other antibiotic agents status: Secondary | ICD-10-CM | POA: Diagnosis not present

## 2015-04-23 DIAGNOSIS — Z91041 Radiographic dye allergy status: Secondary | ICD-10-CM | POA: Diagnosis not present

## 2015-04-23 DIAGNOSIS — R05 Cough: Secondary | ICD-10-CM | POA: Diagnosis not present

## 2015-04-23 DIAGNOSIS — Z8719 Personal history of other diseases of the digestive system: Secondary | ICD-10-CM | POA: Diagnosis not present

## 2015-04-23 DIAGNOSIS — J309 Allergic rhinitis, unspecified: Secondary | ICD-10-CM | POA: Diagnosis not present

## 2015-04-23 DIAGNOSIS — J449 Chronic obstructive pulmonary disease, unspecified: Secondary | ICD-10-CM | POA: Diagnosis not present

## 2015-04-23 DIAGNOSIS — R1030 Lower abdominal pain, unspecified: Secondary | ICD-10-CM | POA: Diagnosis not present

## 2015-04-23 DIAGNOSIS — J9811 Atelectasis: Secondary | ICD-10-CM | POA: Diagnosis not present

## 2015-04-23 DIAGNOSIS — R1013 Epigastric pain: Secondary | ICD-10-CM | POA: Diagnosis not present

## 2015-04-23 DIAGNOSIS — Z87891 Personal history of nicotine dependence: Secondary | ICD-10-CM | POA: Diagnosis not present

## 2015-04-23 DIAGNOSIS — Z882 Allergy status to sulfonamides status: Secondary | ICD-10-CM | POA: Diagnosis not present

## 2015-04-23 DIAGNOSIS — I5030 Unspecified diastolic (congestive) heart failure: Secondary | ICD-10-CM | POA: Diagnosis not present

## 2015-04-23 DIAGNOSIS — M519 Unspecified thoracic, thoracolumbar and lumbosacral intervertebral disc disorder: Secondary | ICD-10-CM | POA: Diagnosis not present

## 2015-04-23 DIAGNOSIS — R110 Nausea: Secondary | ICD-10-CM | POA: Diagnosis not present

## 2015-04-23 DIAGNOSIS — R32 Unspecified urinary incontinence: Secondary | ICD-10-CM | POA: Diagnosis not present

## 2015-04-23 DIAGNOSIS — R2681 Unsteadiness on feet: Secondary | ICD-10-CM | POA: Diagnosis not present

## 2015-04-23 DIAGNOSIS — J329 Chronic sinusitis, unspecified: Secondary | ICD-10-CM | POA: Diagnosis not present

## 2015-04-23 DIAGNOSIS — R350 Frequency of micturition: Secondary | ICD-10-CM | POA: Diagnosis not present

## 2015-04-23 DIAGNOSIS — Z79811 Long term (current) use of aromatase inhibitors: Secondary | ICD-10-CM | POA: Diagnosis not present

## 2015-04-23 DIAGNOSIS — D6862 Lupus anticoagulant syndrome: Secondary | ICD-10-CM | POA: Diagnosis not present

## 2015-04-23 DIAGNOSIS — R509 Fever, unspecified: Secondary | ICD-10-CM | POA: Diagnosis not present

## 2015-04-23 DIAGNOSIS — Z9010 Acquired absence of unspecified breast and nipple: Secondary | ICD-10-CM | POA: Diagnosis not present

## 2015-04-23 DIAGNOSIS — Z888 Allergy status to other drugs, medicaments and biological substances status: Secondary | ICD-10-CM | POA: Diagnosis not present

## 2015-04-23 DIAGNOSIS — J439 Emphysema, unspecified: Secondary | ICD-10-CM | POA: Diagnosis not present

## 2015-04-23 DIAGNOSIS — M6281 Muscle weakness (generalized): Secondary | ICD-10-CM | POA: Diagnosis not present

## 2015-04-23 DIAGNOSIS — K2951 Unspecified chronic gastritis with bleeding: Secondary | ICD-10-CM | POA: Diagnosis present

## 2015-04-23 DIAGNOSIS — Z7901 Long term (current) use of anticoagulants: Secondary | ICD-10-CM | POA: Diagnosis not present

## 2015-04-23 DIAGNOSIS — R0602 Shortness of breath: Secondary | ICD-10-CM | POA: Diagnosis not present

## 2015-04-23 DIAGNOSIS — I2510 Atherosclerotic heart disease of native coronary artery without angina pectoris: Secondary | ICD-10-CM | POA: Diagnosis not present

## 2015-04-23 DIAGNOSIS — J90 Pleural effusion, not elsewhere classified: Secondary | ICD-10-CM | POA: Diagnosis not present

## 2015-04-23 DIAGNOSIS — E538 Deficiency of other specified B group vitamins: Secondary | ICD-10-CM | POA: Diagnosis not present

## 2015-04-24 DIAGNOSIS — I4891 Unspecified atrial fibrillation: Secondary | ICD-10-CM | POA: Diagnosis not present

## 2015-04-24 DIAGNOSIS — I272 Other secondary pulmonary hypertension: Secondary | ICD-10-CM | POA: Diagnosis not present

## 2015-04-24 DIAGNOSIS — M329 Systemic lupus erythematosus, unspecified: Secondary | ICD-10-CM | POA: Diagnosis not present

## 2015-04-24 DIAGNOSIS — Z931 Gastrostomy status: Secondary | ICD-10-CM | POA: Diagnosis not present

## 2015-04-25 ENCOUNTER — Other Ambulatory Visit
Admission: RE | Admit: 2015-04-25 | Discharge: 2015-04-25 | Disposition: A | Discharge status: Home / Self Care | Payer: Medicare Other | Source: Ambulatory Visit | Attending: Gerontology | Admitting: Gerontology

## 2015-04-25 DIAGNOSIS — I4891 Unspecified atrial fibrillation: Secondary | ICD-10-CM | POA: Insufficient documentation

## 2015-04-25 LAB — PROTIME-INR
INR: 1.92
Prothrombin Time: 22.1 seconds — ABNORMAL HIGH (ref 11.4–15.0)

## 2015-04-28 DIAGNOSIS — J449 Chronic obstructive pulmonary disease, unspecified: Secondary | ICD-10-CM | POA: Diagnosis not present

## 2015-04-28 DIAGNOSIS — R0602 Shortness of breath: Secondary | ICD-10-CM | POA: Diagnosis not present

## 2015-04-28 DIAGNOSIS — J90 Pleural effusion, not elsewhere classified: Secondary | ICD-10-CM | POA: Diagnosis not present

## 2015-04-28 DIAGNOSIS — R918 Other nonspecific abnormal finding of lung field: Secondary | ICD-10-CM | POA: Diagnosis not present

## 2015-04-28 DIAGNOSIS — J9811 Atelectasis: Secondary | ICD-10-CM | POA: Diagnosis not present

## 2015-04-28 DIAGNOSIS — I272 Other secondary pulmonary hypertension: Secondary | ICD-10-CM | POA: Diagnosis not present

## 2015-04-28 LAB — PROTIME-INR
INR: 1.43
Prothrombin Time: 17.6 seconds — ABNORMAL HIGH (ref 11.4–15.0)

## 2015-04-30 DIAGNOSIS — R05 Cough: Secondary | ICD-10-CM | POA: Diagnosis not present

## 2015-05-01 LAB — PROTIME-INR
INR: 1.2
PROTHROMBIN TIME: 15.4 s — AB (ref 11.4–15.0)

## 2015-05-04 ENCOUNTER — Other Ambulatory Visit
Admission: RE | Admit: 2015-05-04 | Discharge: 2015-05-04 | Disposition: A | Discharge status: Home / Self Care | Payer: Medicare Other | Source: Other Acute Inpatient Hospital | Attending: Internal Medicine | Admitting: Internal Medicine

## 2015-05-04 DIAGNOSIS — I4891 Unspecified atrial fibrillation: Secondary | ICD-10-CM | POA: Insufficient documentation

## 2015-05-04 LAB — PROTIME-INR
INR: 1.37
PROTHROMBIN TIME: 17.1 s — AB (ref 11.4–15.0)

## 2015-05-07 LAB — PROTIME-INR
INR: 1.67
Prothrombin Time: 19.9 seconds — ABNORMAL HIGH (ref 11.4–15.0)

## 2015-05-12 DIAGNOSIS — K3189 Other diseases of stomach and duodenum: Secondary | ICD-10-CM | POA: Diagnosis not present

## 2015-05-12 DIAGNOSIS — Z9889 Other specified postprocedural states: Secondary | ICD-10-CM | POA: Diagnosis not present

## 2015-05-12 DIAGNOSIS — Z8719 Personal history of other diseases of the digestive system: Secondary | ICD-10-CM | POA: Diagnosis not present

## 2015-05-12 DIAGNOSIS — I270 Primary pulmonary hypertension: Secondary | ICD-10-CM | POA: Diagnosis not present

## 2015-05-12 LAB — CBC WITH DIFFERENTIAL/PLATELET
BASOS ABS: 0.1 10*3/uL (ref 0–0.1)
Basophils Relative: 1 %
Eosinophils Absolute: 0.2 10*3/uL (ref 0–0.7)
Eosinophils Relative: 2 %
HEMATOCRIT: 26.8 % — AB (ref 35.0–47.0)
HEMOGLOBIN: 8.4 g/dL — AB (ref 12.0–16.0)
Lymphocytes Relative: 17 %
Lymphs Abs: 1.6 10*3/uL (ref 1.0–3.6)
MCH: 26.9 pg (ref 26.0–34.0)
MCHC: 31.4 g/dL — AB (ref 32.0–36.0)
MCV: 85.8 fL (ref 80.0–100.0)
MONOS PCT: 11 %
Monocytes Absolute: 1.1 10*3/uL — ABNORMAL HIGH (ref 0.2–0.9)
NEUTROS ABS: 6.9 10*3/uL — AB (ref 1.4–6.5)
NEUTROS PCT: 69 %
Platelets: 573 10*3/uL — ABNORMAL HIGH (ref 150–440)
RBC: 3.13 MIL/uL — ABNORMAL LOW (ref 3.80–5.20)
RDW: 17.7 % — ABNORMAL HIGH (ref 11.5–14.5)
WBC: 9.8 10*3/uL (ref 3.6–11.0)

## 2015-05-12 LAB — COMPREHENSIVE METABOLIC PANEL
ALK PHOS: 64 U/L (ref 38–126)
ALT: 6 U/L — AB (ref 14–54)
AST: 18 U/L (ref 15–41)
Albumin: 2.5 g/dL — ABNORMAL LOW (ref 3.5–5.0)
Anion gap: 10 (ref 5–15)
BILIRUBIN TOTAL: 0.3 mg/dL (ref 0.3–1.2)
BUN: 19 mg/dL (ref 6–20)
CO2: 27 mmol/L (ref 22–32)
CREATININE: 1.27 mg/dL — AB (ref 0.44–1.00)
Calcium: 8.5 mg/dL — ABNORMAL LOW (ref 8.9–10.3)
Chloride: 98 mmol/L — ABNORMAL LOW (ref 101–111)
GFR calc Af Amer: 46 mL/min — ABNORMAL LOW (ref >60)
GFR calc non Af Amer: 39 mL/min — ABNORMAL LOW (ref >60)
Glucose, Bld: 110 mg/dL — ABNORMAL HIGH (ref 65–99)
Potassium: 3.5 mmol/L (ref 3.5–5.1)
Sodium: 135 mmol/L (ref 135–145)
TOTAL PROTEIN: 5.8 g/dL — AB (ref 6.5–8.1)

## 2015-05-12 LAB — PROTIME-INR
INR: 2.32
Prothrombin Time: 25.6 seconds — ABNORMAL HIGH (ref 11.4–15.0)

## 2015-05-19 LAB — CBC WITH DIFFERENTIAL/PLATELET
Basophils Absolute: 0.1 10*3/uL (ref 0–0.1)
Basophils Relative: 2 %
EOS PCT: 4 %
Eosinophils Absolute: 0.2 10*3/uL (ref 0–0.7)
HCT: 25.1 % — ABNORMAL LOW (ref 35.0–47.0)
Hemoglobin: 7.8 g/dL — ABNORMAL LOW (ref 12.0–16.0)
Lymphocytes Relative: 27 %
Lymphs Abs: 1.3 10*3/uL (ref 1.0–3.6)
MCH: 26.9 pg (ref 26.0–34.0)
MCHC: 31.1 g/dL — ABNORMAL LOW (ref 32.0–36.0)
MCV: 86.5 fL (ref 80.0–100.0)
Monocytes Absolute: 0.6 10*3/uL (ref 0.2–0.9)
Monocytes Relative: 13 %
Neutro Abs: 2.7 10*3/uL (ref 1.4–6.5)
Neutrophils Relative %: 54 %
PLATELETS: 532 10*3/uL — AB (ref 150–440)
RBC: 2.9 MIL/uL — AB (ref 3.80–5.20)
RDW: 18.9 % — ABNORMAL HIGH (ref 11.5–14.5)
WBC: 4.9 10*3/uL (ref 3.6–11.0)

## 2015-05-19 LAB — COMPREHENSIVE METABOLIC PANEL
ALT: 7 U/L — AB (ref 14–54)
ANION GAP: 12 (ref 5–15)
AST: 18 U/L (ref 15–41)
Albumin: 2.4 g/dL — ABNORMAL LOW (ref 3.5–5.0)
Alkaline Phosphatase: 57 U/L (ref 38–126)
BILIRUBIN TOTAL: 0.4 mg/dL (ref 0.3–1.2)
BUN: 23 mg/dL — AB (ref 6–20)
CALCIUM: 8.5 mg/dL — AB (ref 8.9–10.3)
CO2: 28 mmol/L (ref 22–32)
CREATININE: 1.43 mg/dL — AB (ref 0.44–1.00)
Chloride: 100 mmol/L — ABNORMAL LOW (ref 101–111)
GFR calc Af Amer: 40 mL/min — ABNORMAL LOW (ref >60)
GFR calc non Af Amer: 34 mL/min — ABNORMAL LOW (ref >60)
GLUCOSE: 108 mg/dL — AB (ref 65–99)
Potassium: 3.1 mmol/L — ABNORMAL LOW (ref 3.5–5.1)
Sodium: 140 mmol/L (ref 135–145)
TOTAL PROTEIN: 5.2 g/dL — AB (ref 6.5–8.1)

## 2015-05-19 LAB — PROTIME-INR
INR: 3.19
Prothrombin Time: 32.7 seconds — ABNORMAL HIGH (ref 11.4–15.0)

## 2015-05-20 DIAGNOSIS — R110 Nausea: Secondary | ICD-10-CM | POA: Diagnosis not present

## 2015-05-21 ENCOUNTER — Encounter
Admission: RE | Admit: 2015-05-21 | Discharge: 2015-05-21 | Disposition: A | Discharge status: Home / Self Care | Payer: Medicare Other | Source: Ambulatory Visit | Attending: Internal Medicine | Admitting: Internal Medicine

## 2015-05-21 DIAGNOSIS — E870 Hyperosmolality and hypernatremia: Secondary | ICD-10-CM | POA: Diagnosis present

## 2015-05-21 DIAGNOSIS — R2681 Unsteadiness on feet: Secondary | ICD-10-CM | POA: Diagnosis not present

## 2015-05-21 DIAGNOSIS — R509 Fever, unspecified: Secondary | ICD-10-CM | POA: Diagnosis not present

## 2015-05-21 DIAGNOSIS — Z7952 Long term (current) use of systemic steroids: Secondary | ICD-10-CM | POA: Diagnosis not present

## 2015-05-21 DIAGNOSIS — Z48815 Encounter for surgical aftercare following surgery on the digestive system: Secondary | ICD-10-CM | POA: Diagnosis not present

## 2015-05-21 DIAGNOSIS — J309 Allergic rhinitis, unspecified: Secondary | ICD-10-CM | POA: Diagnosis not present

## 2015-05-21 DIAGNOSIS — R309 Painful micturition, unspecified: Secondary | ICD-10-CM | POA: Diagnosis not present

## 2015-05-21 DIAGNOSIS — Z91041 Radiographic dye allergy status: Secondary | ICD-10-CM | POA: Diagnosis not present

## 2015-05-21 DIAGNOSIS — E039 Hypothyroidism, unspecified: Secondary | ICD-10-CM | POA: Diagnosis present

## 2015-05-21 DIAGNOSIS — R05 Cough: Secondary | ICD-10-CM | POA: Diagnosis not present

## 2015-05-21 DIAGNOSIS — K5730 Diverticulosis of large intestine without perforation or abscess without bleeding: Secondary | ICD-10-CM | POA: Diagnosis present

## 2015-05-21 DIAGNOSIS — R109 Unspecified abdominal pain: Secondary | ICD-10-CM | POA: Diagnosis not present

## 2015-05-21 DIAGNOSIS — I825Z1 Chronic embolism and thrombosis of unspecified deep veins of right distal lower extremity: Secondary | ICD-10-CM | POA: Diagnosis present

## 2015-05-21 DIAGNOSIS — M810 Age-related osteoporosis without current pathological fracture: Secondary | ICD-10-CM | POA: Diagnosis not present

## 2015-05-21 DIAGNOSIS — M546 Pain in thoracic spine: Secondary | ICD-10-CM | POA: Diagnosis not present

## 2015-05-21 DIAGNOSIS — R1030 Lower abdominal pain, unspecified: Secondary | ICD-10-CM | POA: Diagnosis not present

## 2015-05-21 DIAGNOSIS — M329 Systemic lupus erythematosus, unspecified: Secondary | ICD-10-CM | POA: Diagnosis present

## 2015-05-21 DIAGNOSIS — R350 Frequency of micturition: Secondary | ICD-10-CM | POA: Diagnosis not present

## 2015-05-21 DIAGNOSIS — Z882 Allergy status to sulfonamides status: Secondary | ICD-10-CM | POA: Diagnosis not present

## 2015-05-21 DIAGNOSIS — I270 Primary pulmonary hypertension: Secondary | ICD-10-CM | POA: Diagnosis present

## 2015-05-21 DIAGNOSIS — D649 Anemia, unspecified: Secondary | ICD-10-CM | POA: Diagnosis not present

## 2015-05-21 DIAGNOSIS — K449 Diaphragmatic hernia without obstruction or gangrene: Secondary | ICD-10-CM | POA: Diagnosis present

## 2015-05-21 DIAGNOSIS — Z79891 Long term (current) use of opiate analgesic: Secondary | ICD-10-CM | POA: Diagnosis not present

## 2015-05-21 DIAGNOSIS — K2951 Unspecified chronic gastritis with bleeding: Secondary | ICD-10-CM | POA: Diagnosis present

## 2015-05-21 DIAGNOSIS — Z79811 Long term (current) use of aromatase inhibitors: Secondary | ICD-10-CM | POA: Diagnosis not present

## 2015-05-21 DIAGNOSIS — Z5181 Encounter for therapeutic drug level monitoring: Secondary | ICD-10-CM | POA: Diagnosis not present

## 2015-05-21 DIAGNOSIS — Z853 Personal history of malignant neoplasm of breast: Secondary | ICD-10-CM | POA: Diagnosis not present

## 2015-05-21 DIAGNOSIS — E860 Dehydration: Secondary | ICD-10-CM | POA: Diagnosis present

## 2015-05-21 DIAGNOSIS — B370 Candidal stomatitis: Secondary | ICD-10-CM | POA: Diagnosis present

## 2015-05-21 DIAGNOSIS — Z431 Encounter for attention to gastrostomy: Secondary | ICD-10-CM | POA: Diagnosis not present

## 2015-05-21 DIAGNOSIS — K224 Dyskinesia of esophagus: Secondary | ICD-10-CM | POA: Diagnosis not present

## 2015-05-21 DIAGNOSIS — I509 Heart failure, unspecified: Secondary | ICD-10-CM | POA: Diagnosis present

## 2015-05-21 DIAGNOSIS — R1013 Epigastric pain: Secondary | ICD-10-CM | POA: Diagnosis not present

## 2015-05-21 DIAGNOSIS — Z7951 Long term (current) use of inhaled steroids: Secondary | ICD-10-CM | POA: Diagnosis not present

## 2015-05-21 DIAGNOSIS — E538 Deficiency of other specified B group vitamins: Secondary | ICD-10-CM | POA: Diagnosis present

## 2015-05-21 DIAGNOSIS — K625 Hemorrhage of anus and rectum: Secondary | ICD-10-CM | POA: Diagnosis not present

## 2015-05-21 DIAGNOSIS — E119 Type 2 diabetes mellitus without complications: Secondary | ICD-10-CM | POA: Diagnosis present

## 2015-05-21 DIAGNOSIS — I272 Other secondary pulmonary hypertension: Secondary | ICD-10-CM | POA: Diagnosis not present

## 2015-05-21 DIAGNOSIS — Z79899 Other long term (current) drug therapy: Secondary | ICD-10-CM | POA: Diagnosis not present

## 2015-05-21 DIAGNOSIS — K922 Gastrointestinal hemorrhage, unspecified: Secondary | ICD-10-CM | POA: Diagnosis present

## 2015-05-21 DIAGNOSIS — E876 Hypokalemia: Secondary | ICD-10-CM | POA: Diagnosis present

## 2015-05-21 DIAGNOSIS — Z87891 Personal history of nicotine dependence: Secondary | ICD-10-CM | POA: Diagnosis not present

## 2015-05-21 DIAGNOSIS — K2901 Acute gastritis with bleeding: Secondary | ICD-10-CM | POA: Diagnosis present

## 2015-05-21 DIAGNOSIS — Z881 Allergy status to other antibiotic agents status: Secondary | ICD-10-CM | POA: Diagnosis not present

## 2015-05-21 DIAGNOSIS — I4891 Unspecified atrial fibrillation: Secondary | ICD-10-CM | POA: Diagnosis not present

## 2015-05-21 DIAGNOSIS — K210 Gastro-esophageal reflux disease with esophagitis: Secondary | ICD-10-CM | POA: Diagnosis present

## 2015-05-21 DIAGNOSIS — I519 Heart disease, unspecified: Secondary | ICD-10-CM | POA: Diagnosis not present

## 2015-05-21 DIAGNOSIS — M3500 Sicca syndrome, unspecified: Secondary | ICD-10-CM | POA: Diagnosis not present

## 2015-05-21 DIAGNOSIS — J449 Chronic obstructive pulmonary disease, unspecified: Secondary | ICD-10-CM | POA: Diagnosis not present

## 2015-05-21 DIAGNOSIS — R112 Nausea with vomiting, unspecified: Secondary | ICD-10-CM | POA: Diagnosis not present

## 2015-05-21 DIAGNOSIS — B3781 Candidal esophagitis: Secondary | ICD-10-CM | POA: Diagnosis present

## 2015-05-21 DIAGNOSIS — M545 Low back pain: Secondary | ICD-10-CM | POA: Diagnosis not present

## 2015-05-21 DIAGNOSIS — Z888 Allergy status to other drugs, medicaments and biological substances status: Secondary | ICD-10-CM | POA: Diagnosis not present

## 2015-05-21 DIAGNOSIS — L930 Discoid lupus erythematosus: Secondary | ICD-10-CM | POA: Diagnosis present

## 2015-05-21 DIAGNOSIS — N958 Other specified menopausal and perimenopausal disorders: Secondary | ICD-10-CM | POA: Diagnosis present

## 2015-05-21 DIAGNOSIS — Z7901 Long term (current) use of anticoagulants: Secondary | ICD-10-CM | POA: Diagnosis not present

## 2015-05-21 DIAGNOSIS — M6281 Muscle weakness (generalized): Secondary | ICD-10-CM | POA: Diagnosis not present

## 2015-05-21 DIAGNOSIS — R0689 Other abnormalities of breathing: Secondary | ICD-10-CM | POA: Diagnosis not present

## 2015-05-26 ENCOUNTER — Encounter: Payer: Self-pay | Admitting: Internal Medicine

## 2015-05-26 DIAGNOSIS — K208 Other esophagitis without bleeding: Secondary | ICD-10-CM

## 2015-05-26 DIAGNOSIS — B258 Other cytomegaloviral diseases: Secondary | ICD-10-CM

## 2015-05-26 DIAGNOSIS — B259 Cytomegaloviral disease, unspecified: Secondary | ICD-10-CM

## 2015-05-26 HISTORY — DX: Cytomegaloviral disease, unspecified: B25.9

## 2015-05-26 HISTORY — DX: Other esophagitis without bleeding: K20.80

## 2015-05-26 LAB — COMPREHENSIVE METABOLIC PANEL
ALT: 6 U/L — ABNORMAL LOW (ref 14–54)
ANION GAP: 8 (ref 5–15)
AST: 14 U/L — ABNORMAL LOW (ref 15–41)
Albumin: 2.3 g/dL — ABNORMAL LOW (ref 3.5–5.0)
Alkaline Phosphatase: 56 U/L (ref 38–126)
BUN: 22 mg/dL — ABNORMAL HIGH (ref 6–20)
CALCIUM: 8.1 mg/dL — AB (ref 8.9–10.3)
CO2: 29 mmol/L (ref 22–32)
Chloride: 102 mmol/L (ref 101–111)
Creatinine, Ser: 1.36 mg/dL — ABNORMAL HIGH (ref 0.44–1.00)
GFR calc non Af Amer: 36 mL/min — ABNORMAL LOW (ref >60)
GFR, EST AFRICAN AMERICAN: 42 mL/min — AB (ref >60)
GLUCOSE: 93 mg/dL (ref 65–99)
POTASSIUM: 3.2 mmol/L — AB (ref 3.5–5.1)
Sodium: 139 mmol/L (ref 135–145)
TOTAL PROTEIN: 5.3 g/dL — AB (ref 6.5–8.1)
Total Bilirubin: 0.3 mg/dL (ref 0.3–1.2)

## 2015-05-26 LAB — PROTIME-INR
INR: 3.35
Prothrombin Time: 34 seconds — ABNORMAL HIGH (ref 11.4–15.0)

## 2015-05-26 LAB — CBC WITH DIFFERENTIAL/PLATELET
BASOS PCT: 1 %
Basophils Absolute: 0 10*3/uL (ref 0–0.1)
EOS PCT: 5 %
Eosinophils Absolute: 0.2 10*3/uL (ref 0–0.7)
HCT: 25.1 % — ABNORMAL LOW (ref 35.0–47.0)
Hemoglobin: 7.9 g/dL — ABNORMAL LOW (ref 12.0–16.0)
Lymphocytes Relative: 19 %
Lymphs Abs: 1 10*3/uL (ref 1.0–3.6)
MCH: 26.8 pg (ref 26.0–34.0)
MCHC: 31.4 g/dL — ABNORMAL LOW (ref 32.0–36.0)
MCV: 85.3 fL (ref 80.0–100.0)
MONO ABS: 0.5 10*3/uL (ref 0.2–0.9)
Monocytes Relative: 10 %
Neutro Abs: 3.4 10*3/uL (ref 1.4–6.5)
Neutrophils Relative %: 65 %
PLATELETS: 466 10*3/uL — AB (ref 150–440)
RBC: 2.94 MIL/uL — AB (ref 3.80–5.20)
RDW: 18.9 % — AB (ref 11.5–14.5)
WBC: 5.2 10*3/uL (ref 3.6–11.0)

## 2015-05-29 ENCOUNTER — Other Ambulatory Visit
Admission: RE | Admit: 2015-05-29 | Discharge: 2015-05-29 | Disposition: A | Discharge status: Home / Self Care | Payer: Medicare Other | Source: Ambulatory Visit | Attending: Internal Medicine | Admitting: Internal Medicine

## 2015-05-29 DIAGNOSIS — R309 Painful micturition, unspecified: Secondary | ICD-10-CM | POA: Insufficient documentation

## 2015-05-29 DIAGNOSIS — R350 Frequency of micturition: Secondary | ICD-10-CM | POA: Insufficient documentation

## 2015-05-29 LAB — CBC WITH DIFFERENTIAL/PLATELET
Basophils Absolute: 0.1 10*3/uL (ref 0–0.1)
Basophils Relative: 1 %
EOS ABS: 0.2 10*3/uL (ref 0–0.7)
Eosinophils Relative: 3 %
HCT: 24.5 % — ABNORMAL LOW (ref 35.0–47.0)
Hemoglobin: 7.7 g/dL — ABNORMAL LOW (ref 12.0–16.0)
LYMPHS ABS: 1.2 10*3/uL (ref 1.0–3.6)
LYMPHS PCT: 25 %
MCH: 26.9 pg (ref 26.0–34.0)
MCHC: 31.4 g/dL — AB (ref 32.0–36.0)
MCV: 85.7 fL (ref 80.0–100.0)
MONOS PCT: 16 %
Monocytes Absolute: 0.8 10*3/uL (ref 0.2–0.9)
Neutro Abs: 2.6 10*3/uL (ref 1.4–6.5)
Neutrophils Relative %: 55 %
PLATELETS: 452 10*3/uL — AB (ref 150–440)
RBC: 2.86 MIL/uL — AB (ref 3.80–5.20)
RDW: 18.2 % — ABNORMAL HIGH (ref 11.5–14.5)
WBC: 4.9 10*3/uL (ref 3.6–11.0)

## 2015-05-29 LAB — COMPREHENSIVE METABOLIC PANEL
ALT: 7 U/L — AB (ref 14–54)
ANION GAP: 7 (ref 5–15)
AST: 16 U/L (ref 15–41)
Albumin: 2.2 g/dL — ABNORMAL LOW (ref 3.5–5.0)
Alkaline Phosphatase: 59 U/L (ref 38–126)
BUN: 25 mg/dL — ABNORMAL HIGH (ref 6–20)
CO2: 30 mmol/L (ref 22–32)
CREATININE: 1.39 mg/dL — AB (ref 0.44–1.00)
Calcium: 8.3 mg/dL — ABNORMAL LOW (ref 8.9–10.3)
Chloride: 103 mmol/L (ref 101–111)
GFR, EST AFRICAN AMERICAN: 41 mL/min — AB (ref >60)
GFR, EST NON AFRICAN AMERICAN: 35 mL/min — AB (ref >60)
Glucose, Bld: 103 mg/dL — ABNORMAL HIGH (ref 65–99)
Potassium: 3.8 mmol/L (ref 3.5–5.1)
Sodium: 140 mmol/L (ref 135–145)
Total Bilirubin: 0.3 mg/dL (ref 0.3–1.2)
Total Protein: 5.2 g/dL — ABNORMAL LOW (ref 6.5–8.1)

## 2015-05-29 LAB — URINALYSIS COMPLETE WITH MICROSCOPIC (ARMC ONLY)
BILIRUBIN URINE: NEGATIVE
Bacteria, UA: NONE SEEN
Glucose, UA: NEGATIVE mg/dL
Hgb urine dipstick: NEGATIVE
Ketones, ur: NEGATIVE mg/dL
Nitrite: NEGATIVE
Protein, ur: NEGATIVE mg/dL
Specific Gravity, Urine: 1.012 (ref 1.005–1.030)
pH: 6 (ref 5.0–8.0)

## 2015-05-29 LAB — PROTIME-INR
INR: 2.79
PROTHROMBIN TIME: 29.5 s — AB (ref 11.4–15.0)

## 2015-05-31 LAB — URINE CULTURE

## 2015-06-01 DIAGNOSIS — R112 Nausea with vomiting, unspecified: Secondary | ICD-10-CM | POA: Diagnosis not present

## 2015-06-01 DIAGNOSIS — M810 Age-related osteoporosis without current pathological fracture: Secondary | ICD-10-CM | POA: Diagnosis not present

## 2015-06-01 LAB — URINALYSIS COMPLETE WITH MICROSCOPIC (ARMC ONLY)
BILIRUBIN URINE: NEGATIVE
Bacteria, UA: NONE SEEN
GLUCOSE, UA: NEGATIVE mg/dL
HGB URINE DIPSTICK: NEGATIVE
Ketones, ur: NEGATIVE mg/dL
Leukocytes, UA: NEGATIVE
Nitrite: NEGATIVE
Protein, ur: NEGATIVE mg/dL
Specific Gravity, Urine: 1.016 (ref 1.005–1.030)
pH: 5 (ref 5.0–8.0)

## 2015-06-02 LAB — CBC WITH DIFFERENTIAL/PLATELET
BASOS PCT: 1 %
Basophils Absolute: 0.1 10*3/uL (ref 0–0.1)
EOS ABS: 0.2 10*3/uL (ref 0–0.7)
Eosinophils Relative: 4 %
HCT: 23.4 % — ABNORMAL LOW (ref 35.0–47.0)
HEMOGLOBIN: 7.3 g/dL — AB (ref 12.0–16.0)
Lymphocytes Relative: 17 %
Lymphs Abs: 1 10*3/uL (ref 1.0–3.6)
MCH: 26.3 pg (ref 26.0–34.0)
MCHC: 31.2 g/dL — AB (ref 32.0–36.0)
MCV: 84.3 fL (ref 80.0–100.0)
MONO ABS: 0.5 10*3/uL (ref 0.2–0.9)
Monocytes Relative: 8 %
NEUTROS ABS: 3.9 10*3/uL (ref 1.4–6.5)
NEUTROS PCT: 70 %
Platelets: 469 10*3/uL — ABNORMAL HIGH (ref 150–440)
RBC: 2.77 MIL/uL — ABNORMAL LOW (ref 3.80–5.20)
RDW: 18.6 % — AB (ref 11.5–14.5)
WBC: 5.7 10*3/uL (ref 3.6–11.0)

## 2015-06-02 LAB — COMPREHENSIVE METABOLIC PANEL
ALT: 6 U/L — ABNORMAL LOW (ref 14–54)
AST: 17 U/L (ref 15–41)
Albumin: 2 g/dL — ABNORMAL LOW (ref 3.5–5.0)
Alkaline Phosphatase: 62 U/L (ref 38–126)
Anion gap: 9 (ref 5–15)
BUN: 49 mg/dL — ABNORMAL HIGH (ref 6–20)
CO2: 28 mmol/L (ref 22–32)
Calcium: 8.4 mg/dL — ABNORMAL LOW (ref 8.9–10.3)
Chloride: 105 mmol/L (ref 101–111)
Creatinine, Ser: 1.08 mg/dL — ABNORMAL HIGH (ref 0.44–1.00)
GFR, EST AFRICAN AMERICAN: 55 mL/min — AB (ref >60)
GFR, EST NON AFRICAN AMERICAN: 48 mL/min — AB (ref >60)
Glucose, Bld: 100 mg/dL — ABNORMAL HIGH (ref 65–99)
POTASSIUM: 3.3 mmol/L — AB (ref 3.5–5.1)
Sodium: 142 mmol/L (ref 135–145)
Total Bilirubin: 0.3 mg/dL (ref 0.3–1.2)
Total Protein: 4.9 g/dL — ABNORMAL LOW (ref 6.5–8.1)

## 2015-06-02 LAB — PROTIME-INR
INR: 5.69 — AB
Prothrombin Time: 51.1 seconds — ABNORMAL HIGH (ref 11.4–15.0)

## 2015-06-03 LAB — CBC WITH DIFFERENTIAL/PLATELET
Basophils Absolute: 0.1 10*3/uL (ref 0–0.1)
Basophils Relative: 1 %
EOS PCT: 6 %
Eosinophils Absolute: 0.3 10*3/uL (ref 0–0.7)
HCT: 22.6 % — ABNORMAL LOW (ref 35.0–47.0)
Hemoglobin: 7 g/dL — ABNORMAL LOW (ref 12.0–16.0)
LYMPHS ABS: 1.1 10*3/uL (ref 1.0–3.6)
Lymphocytes Relative: 22 %
MCH: 26.4 pg (ref 26.0–34.0)
MCHC: 31.1 g/dL — ABNORMAL LOW (ref 32.0–36.0)
MCV: 85 fL (ref 80.0–100.0)
MONOS PCT: 10 %
Monocytes Absolute: 0.5 10*3/uL (ref 0.2–0.9)
Neutro Abs: 3.3 10*3/uL (ref 1.4–6.5)
Neutrophils Relative %: 61 %
PLATELETS: 460 10*3/uL — AB (ref 150–440)
RBC: 2.66 MIL/uL — AB (ref 3.80–5.20)
RDW: 18.3 % — ABNORMAL HIGH (ref 11.5–14.5)
WBC: 5.3 10*3/uL (ref 3.6–11.0)

## 2015-06-03 LAB — COMPREHENSIVE METABOLIC PANEL
ALBUMIN: 1.9 g/dL — AB (ref 3.5–5.0)
ALT: 6 U/L — AB (ref 14–54)
AST: 18 U/L (ref 15–41)
Alkaline Phosphatase: 65 U/L (ref 38–126)
Anion gap: 7 (ref 5–15)
BUN: 51 mg/dL — AB (ref 6–20)
CHLORIDE: 109 mmol/L (ref 101–111)
CO2: 27 mmol/L (ref 22–32)
CREATININE: 1.24 mg/dL — AB (ref 0.44–1.00)
Calcium: 8.4 mg/dL — ABNORMAL LOW (ref 8.9–10.3)
GFR calc Af Amer: 47 mL/min — ABNORMAL LOW (ref >60)
GFR calc non Af Amer: 41 mL/min — ABNORMAL LOW (ref >60)
GLUCOSE: 127 mg/dL — AB (ref 65–99)
POTASSIUM: 4.1 mmol/L (ref 3.5–5.1)
SODIUM: 143 mmol/L (ref 135–145)
Total Bilirubin: 0.3 mg/dL (ref 0.3–1.2)
Total Protein: 4.8 g/dL — ABNORMAL LOW (ref 6.5–8.1)

## 2015-06-03 LAB — URINE CULTURE

## 2015-06-03 LAB — PROTIME-INR
INR: 4.44 — AB
PROTHROMBIN TIME: 42.2 s — AB (ref 11.4–15.0)

## 2015-06-04 ENCOUNTER — Ambulatory Visit
Admission: RE | Admit: 2015-06-04 | Discharge: 2015-06-04 | Disposition: A | Discharge status: Home / Self Care | Payer: No Typology Code available for payment source | Source: Ambulatory Visit | Attending: Family Medicine | Admitting: Family Medicine

## 2015-06-04 DIAGNOSIS — D649 Anemia, unspecified: Secondary | ICD-10-CM | POA: Diagnosis not present

## 2015-06-04 DIAGNOSIS — I4891 Unspecified atrial fibrillation: Secondary | ICD-10-CM | POA: Insufficient documentation

## 2015-06-04 DIAGNOSIS — E876 Hypokalemia: Secondary | ICD-10-CM | POA: Insufficient documentation

## 2015-06-04 LAB — CBC WITH DIFFERENTIAL/PLATELET
BASOS PCT: 1 %
Basophils Absolute: 0.1 10*3/uL (ref 0–0.1)
EOS ABS: 0.2 10*3/uL (ref 0–0.7)
EOS PCT: 3 %
HCT: 24.9 % — ABNORMAL LOW (ref 35.0–47.0)
HEMOGLOBIN: 7.6 g/dL — AB (ref 12.0–16.0)
Lymphocytes Relative: 19 %
Lymphs Abs: 1.3 10*3/uL (ref 1.0–3.6)
MCH: 25.9 pg — ABNORMAL LOW (ref 26.0–34.0)
MCHC: 30.3 g/dL — AB (ref 32.0–36.0)
MCV: 85.5 fL (ref 80.0–100.0)
MONOS PCT: 9 %
Monocytes Absolute: 0.7 10*3/uL (ref 0.2–0.9)
NEUTROS PCT: 68 %
Neutro Abs: 4.9 10*3/uL (ref 1.4–6.5)
PLATELETS: 514 10*3/uL — AB (ref 150–440)
RBC: 2.91 MIL/uL — ABNORMAL LOW (ref 3.80–5.20)
RDW: 18.9 % — AB (ref 11.5–14.5)
WBC: 7.2 10*3/uL (ref 3.6–11.0)

## 2015-06-04 LAB — COMPREHENSIVE METABOLIC PANEL
ALBUMIN: 2.3 g/dL — AB (ref 3.5–5.0)
ALT: 8 U/L — ABNORMAL LOW (ref 14–54)
ANION GAP: 10 (ref 5–15)
AST: 22 U/L (ref 15–41)
Alkaline Phosphatase: 77 U/L (ref 38–126)
BUN: 59 mg/dL — ABNORMAL HIGH (ref 6–20)
CHLORIDE: 113 mmol/L — AB (ref 101–111)
CO2: 25 mmol/L (ref 22–32)
Calcium: 8.9 mg/dL (ref 8.9–10.3)
Creatinine, Ser: 1.34 mg/dL — ABNORMAL HIGH (ref 0.44–1.00)
GFR calc non Af Amer: 37 mL/min — ABNORMAL LOW (ref >60)
GFR, EST AFRICAN AMERICAN: 43 mL/min — AB (ref >60)
GLUCOSE: 105 mg/dL — AB (ref 65–99)
POTASSIUM: 4.5 mmol/L (ref 3.5–5.1)
SODIUM: 148 mmol/L — AB (ref 135–145)
Total Bilirubin: 0.5 mg/dL (ref 0.3–1.2)
Total Protein: 5.5 g/dL — ABNORMAL LOW (ref 6.5–8.1)

## 2015-06-04 LAB — PREPARE RBC (CROSSMATCH)

## 2015-06-05 LAB — CBC WITH DIFFERENTIAL/PLATELET
Band Neutrophils: 0 %
Basophils Absolute: 0.2 10*3/uL — ABNORMAL HIGH (ref 0–0.1)
Basophils Relative: 2 %
Blasts: 0 %
EOS PCT: 0 %
Eosinophils Absolute: 0 10*3/uL (ref 0–0.7)
HCT: 33.2 % — ABNORMAL LOW (ref 35.0–47.0)
Hemoglobin: 10.6 g/dL — ABNORMAL LOW (ref 12.0–16.0)
LYMPHS ABS: 1.3 10*3/uL (ref 1.0–3.6)
Lymphocytes Relative: 15 %
MCH: 27.7 pg (ref 26.0–34.0)
MCHC: 32.1 g/dL (ref 32.0–36.0)
MCV: 86.5 fL (ref 80.0–100.0)
METAMYELOCYTES PCT: 0 %
MONOS PCT: 10 %
Monocytes Absolute: 0.9 10*3/uL (ref 0.2–0.9)
Myelocytes: 0 %
NEUTROS ABS: 6.5 10*3/uL (ref 1.4–6.5)
NEUTROS PCT: 73 %
NRBC: 2 /100{WBCs} — AB
OTHER: 0 %
PLATELETS: 468 10*3/uL — AB (ref 150–440)
Promyelocytes Absolute: 0 %
RBC: 3.84 MIL/uL (ref 3.80–5.20)
RDW: 17.3 % — ABNORMAL HIGH (ref 11.5–14.5)
WBC: 8.9 10*3/uL (ref 3.6–11.0)

## 2015-06-05 LAB — COMPREHENSIVE METABOLIC PANEL
ALT: 7 U/L — ABNORMAL LOW (ref 14–54)
ANION GAP: 9 (ref 5–15)
AST: 19 U/L (ref 15–41)
Albumin: 2.2 g/dL — ABNORMAL LOW (ref 3.5–5.0)
Alkaline Phosphatase: 72 U/L (ref 38–126)
BUN: 56 mg/dL — ABNORMAL HIGH (ref 6–20)
CALCIUM: 8.9 mg/dL (ref 8.9–10.3)
CHLORIDE: 115 mmol/L — AB (ref 101–111)
CO2: 25 mmol/L (ref 22–32)
Creatinine, Ser: 1.22 mg/dL — ABNORMAL HIGH (ref 0.44–1.00)
GFR calc non Af Amer: 41 mL/min — ABNORMAL LOW (ref >60)
GFR, EST AFRICAN AMERICAN: 48 mL/min — AB (ref >60)
Glucose, Bld: 141 mg/dL — ABNORMAL HIGH (ref 65–99)
Potassium: 4.1 mmol/L (ref 3.5–5.1)
SODIUM: 149 mmol/L — AB (ref 135–145)
Total Bilirubin: 0.6 mg/dL (ref 0.3–1.2)
Total Protein: 5.5 g/dL — ABNORMAL LOW (ref 6.5–8.1)

## 2015-06-05 LAB — TYPE AND SCREEN
ABO/RH(D): O POS
Antibody Screen: NEGATIVE
UNIT DIVISION: 0
Unit division: 0

## 2015-06-05 LAB — PROTIME-INR
INR: 1.69
PROTHROMBIN TIME: 20.1 s — AB (ref 11.4–15.0)

## 2015-06-07 ENCOUNTER — Other Ambulatory Visit
Admission: RE | Admit: 2015-06-07 | Discharge: 2015-06-07 | Disposition: A | Discharge status: Home / Self Care | Payer: Medicare Other | Source: Ambulatory Visit | Attending: Internal Medicine | Admitting: Internal Medicine

## 2015-06-07 DIAGNOSIS — K922 Gastrointestinal hemorrhage, unspecified: Secondary | ICD-10-CM | POA: Diagnosis not present

## 2015-06-07 DIAGNOSIS — D649 Anemia, unspecified: Secondary | ICD-10-CM | POA: Insufficient documentation

## 2015-06-07 DIAGNOSIS — R1013 Epigastric pain: Secondary | ICD-10-CM | POA: Diagnosis not present

## 2015-06-07 DIAGNOSIS — R1030 Lower abdominal pain, unspecified: Secondary | ICD-10-CM | POA: Diagnosis not present

## 2015-06-07 DIAGNOSIS — K2901 Acute gastritis with bleeding: Secondary | ICD-10-CM | POA: Diagnosis not present

## 2015-06-07 DIAGNOSIS — I509 Heart failure, unspecified: Secondary | ICD-10-CM | POA: Diagnosis not present

## 2015-06-07 DIAGNOSIS — B370 Candidal stomatitis: Secondary | ICD-10-CM | POA: Diagnosis not present

## 2015-06-07 DIAGNOSIS — E870 Hyperosmolality and hypernatremia: Secondary | ICD-10-CM | POA: Diagnosis not present

## 2015-06-07 DIAGNOSIS — B3781 Candidal esophagitis: Secondary | ICD-10-CM | POA: Diagnosis not present

## 2015-06-07 DIAGNOSIS — I270 Primary pulmonary hypertension: Secondary | ICD-10-CM | POA: Diagnosis not present

## 2015-06-07 LAB — OCCULT BLOOD X 1 CARD TO LAB, STOOL: Fecal Occult Bld: POSITIVE — AB

## 2015-06-08 ENCOUNTER — Inpatient Hospital Stay
Admission: EM | Admit: 2015-06-08 | Discharge: 2015-06-13 | DRG: 357 | Disposition: A | Discharge status: Skilled Nursing Facility | Payer: Medicare Other | Attending: Internal Medicine | Admitting: Internal Medicine

## 2015-06-08 ENCOUNTER — Encounter: Payer: Self-pay | Admitting: Emergency Medicine

## 2015-06-08 ENCOUNTER — Emergency Department: Payer: Medicare Other

## 2015-06-08 ENCOUNTER — Other Ambulatory Visit (HOSPITAL_COMMUNITY): Payer: Self-pay | Admitting: Gerontology

## 2015-06-08 DIAGNOSIS — Z7952 Long term (current) use of systemic steroids: Secondary | ICD-10-CM

## 2015-06-08 DIAGNOSIS — E876 Hypokalemia: Secondary | ICD-10-CM | POA: Diagnosis present

## 2015-06-08 DIAGNOSIS — I509 Heart failure, unspecified: Secondary | ICD-10-CM | POA: Diagnosis present

## 2015-06-08 DIAGNOSIS — Z79899 Other long term (current) drug therapy: Secondary | ICD-10-CM

## 2015-06-08 DIAGNOSIS — M329 Systemic lupus erythematosus, unspecified: Secondary | ICD-10-CM | POA: Diagnosis present

## 2015-06-08 DIAGNOSIS — B370 Candidal stomatitis: Secondary | ICD-10-CM | POA: Diagnosis present

## 2015-06-08 DIAGNOSIS — IMO0001 Reserved for inherently not codable concepts without codable children: Secondary | ICD-10-CM | POA: Diagnosis present

## 2015-06-08 DIAGNOSIS — E860 Dehydration: Secondary | ICD-10-CM | POA: Diagnosis present

## 2015-06-08 DIAGNOSIS — K2901 Acute gastritis with bleeding: Principal | ICD-10-CM | POA: Diagnosis present

## 2015-06-08 DIAGNOSIS — I82509 Chronic embolism and thrombosis of unspecified deep veins of unspecified lower extremity: Secondary | ICD-10-CM

## 2015-06-08 DIAGNOSIS — I82409 Acute embolism and thrombosis of unspecified deep veins of unspecified lower extremity: Secondary | ICD-10-CM

## 2015-06-08 DIAGNOSIS — Z7901 Long term (current) use of anticoagulants: Secondary | ICD-10-CM

## 2015-06-08 DIAGNOSIS — Z86718 Personal history of other venous thrombosis and embolism: Secondary | ICD-10-CM

## 2015-06-08 DIAGNOSIS — K449 Diaphragmatic hernia without obstruction or gangrene: Secondary | ICD-10-CM | POA: Diagnosis present

## 2015-06-08 DIAGNOSIS — I825Z1 Chronic embolism and thrombosis of unspecified deep veins of right distal lower extremity: Secondary | ICD-10-CM | POA: Diagnosis present

## 2015-06-08 DIAGNOSIS — K219 Gastro-esophageal reflux disease without esophagitis: Secondary | ICD-10-CM | POA: Diagnosis present

## 2015-06-08 DIAGNOSIS — B3781 Candidal esophagitis: Secondary | ICD-10-CM | POA: Diagnosis present

## 2015-06-08 DIAGNOSIS — E538 Deficiency of other specified B group vitamins: Secondary | ICD-10-CM | POA: Diagnosis present

## 2015-06-08 DIAGNOSIS — K2951 Unspecified chronic gastritis with bleeding: Secondary | ICD-10-CM | POA: Diagnosis present

## 2015-06-08 DIAGNOSIS — Z87891 Personal history of nicotine dependence: Secondary | ICD-10-CM

## 2015-06-08 DIAGNOSIS — Z91041 Radiographic dye allergy status: Secondary | ICD-10-CM

## 2015-06-08 DIAGNOSIS — K625 Hemorrhage of anus and rectum: Secondary | ICD-10-CM | POA: Diagnosis present

## 2015-06-08 DIAGNOSIS — Z882 Allergy status to sulfonamides status: Secondary | ICD-10-CM

## 2015-06-08 DIAGNOSIS — N958 Other specified menopausal and perimenopausal disorders: Secondary | ICD-10-CM | POA: Diagnosis present

## 2015-06-08 DIAGNOSIS — R112 Nausea with vomiting, unspecified: Secondary | ICD-10-CM

## 2015-06-08 DIAGNOSIS — K210 Gastro-esophageal reflux disease with esophagitis: Secondary | ICD-10-CM | POA: Diagnosis present

## 2015-06-08 DIAGNOSIS — Z881 Allergy status to other antibiotic agents status: Secondary | ICD-10-CM

## 2015-06-08 DIAGNOSIS — K922 Gastrointestinal hemorrhage, unspecified: Secondary | ICD-10-CM | POA: Diagnosis present

## 2015-06-08 DIAGNOSIS — K5730 Diverticulosis of large intestine without perforation or abscess without bleeding: Secondary | ICD-10-CM | POA: Diagnosis present

## 2015-06-08 DIAGNOSIS — Z888 Allergy status to other drugs, medicaments and biological substances status: Secondary | ICD-10-CM

## 2015-06-08 DIAGNOSIS — R1030 Lower abdominal pain, unspecified: Secondary | ICD-10-CM

## 2015-06-08 DIAGNOSIS — I270 Primary pulmonary hypertension: Secondary | ICD-10-CM | POA: Diagnosis present

## 2015-06-08 DIAGNOSIS — L930 Discoid lupus erythematosus: Secondary | ICD-10-CM | POA: Diagnosis present

## 2015-06-08 DIAGNOSIS — E119 Type 2 diabetes mellitus without complications: Secondary | ICD-10-CM | POA: Diagnosis present

## 2015-06-08 DIAGNOSIS — Z853 Personal history of malignant neoplasm of breast: Secondary | ICD-10-CM

## 2015-06-08 DIAGNOSIS — Z79891 Long term (current) use of opiate analgesic: Secondary | ICD-10-CM

## 2015-06-08 DIAGNOSIS — E039 Hypothyroidism, unspecified: Secondary | ICD-10-CM | POA: Diagnosis present

## 2015-06-08 DIAGNOSIS — M3213 Lung involvement in systemic lupus erythematosus: Secondary | ICD-10-CM | POA: Diagnosis present

## 2015-06-08 DIAGNOSIS — E870 Hyperosmolality and hypernatremia: Secondary | ICD-10-CM | POA: Diagnosis present

## 2015-06-08 HISTORY — DX: Disorder of kidney and ureter, unspecified: N28.9

## 2015-06-08 LAB — CBC WITH DIFFERENTIAL/PLATELET
BASOS ABS: 0.1 10*3/uL (ref 0–0.1)
Basophils Relative: 1 %
Eosinophils Absolute: 0.3 10*3/uL (ref 0–0.7)
Eosinophils Relative: 4 %
HCT: 32.9 % — ABNORMAL LOW (ref 35.0–47.0)
HEMOGLOBIN: 10.5 g/dL — AB (ref 12.0–16.0)
LYMPHS PCT: 23 %
Lymphs Abs: 1.7 10*3/uL (ref 1.0–3.6)
MCH: 27.9 pg (ref 26.0–34.0)
MCHC: 31.9 g/dL — ABNORMAL LOW (ref 32.0–36.0)
MCV: 87.5 fL (ref 80.0–100.0)
MONOS PCT: 10 %
Monocytes Absolute: 0.7 10*3/uL (ref 0.2–0.9)
NEUTROS PCT: 62 %
Neutro Abs: 4.6 10*3/uL (ref 1.4–6.5)
Platelets: 409 10*3/uL (ref 150–440)
RBC: 3.76 MIL/uL — AB (ref 3.80–5.20)
RDW: 17.9 % — ABNORMAL HIGH (ref 11.5–14.5)
SMEAR REVIEW: ADEQUATE
WBC: 7.4 10*3/uL (ref 3.6–11.0)

## 2015-06-08 LAB — COMPREHENSIVE METABOLIC PANEL
ALBUMIN: 1.9 g/dL — AB (ref 3.5–5.0)
ALBUMIN: 2.3 g/dL — AB (ref 3.5–5.0)
ALK PHOS: 77 U/L (ref 38–126)
ALT: 8 U/L — AB (ref 14–54)
ALT: 9 U/L — AB (ref 14–54)
AST: 14 U/L — ABNORMAL LOW (ref 15–41)
AST: 19 U/L (ref 15–41)
Alkaline Phosphatase: 89 U/L (ref 38–126)
Anion gap: 8 (ref 5–15)
Anion gap: 9 (ref 5–15)
BILIRUBIN TOTAL: 0.7 mg/dL (ref 0.3–1.2)
BUN: 57 mg/dL — ABNORMAL HIGH (ref 6–20)
BUN: 56 mg/dL — AB (ref 6–20)
CALCIUM: 8.8 mg/dL — AB (ref 8.9–10.3)
CHLORIDE: 118 mmol/L — AB (ref 101–111)
CHLORIDE: 117 mmol/L — AB (ref 101–111)
CO2: 28 mmol/L (ref 22–32)
CO2: 25 mmol/L (ref 22–32)
CREATININE: 1.12 mg/dL — AB (ref 0.44–1.00)
CREATININE: 1.3 mg/dL — AB (ref 0.44–1.00)
Calcium: 9.1 mg/dL (ref 8.9–10.3)
GFR calc Af Amer: 53 mL/min — ABNORMAL LOW (ref >60)
GFR calc Af Amer: 44 mL/min — ABNORMAL LOW (ref >60)
GFR calc non Af Amer: 46 mL/min — ABNORMAL LOW (ref >60)
GFR, EST NON AFRICAN AMERICAN: 38 mL/min — AB (ref >60)
GLUCOSE: 104 mg/dL — AB (ref 65–99)
GLUCOSE: 160 mg/dL — AB (ref 65–99)
Potassium: 3.6 mmol/L (ref 3.5–5.1)
Potassium: 4.1 mmol/L (ref 3.5–5.1)
SODIUM: 154 mmol/L — AB (ref 135–145)
Sodium: 151 mmol/L — ABNORMAL HIGH (ref 135–145)
Total Bilirubin: 0.4 mg/dL (ref 0.3–1.2)
Total Protein: 5.1 g/dL — ABNORMAL LOW (ref 6.5–8.1)
Total Protein: 5.9 g/dL — ABNORMAL LOW (ref 6.5–8.1)

## 2015-06-08 LAB — URINALYSIS COMPLETE WITH MICROSCOPIC (ARMC ONLY)
BACTERIA UA: NONE SEEN
BILIRUBIN URINE: NEGATIVE
GLUCOSE, UA: NEGATIVE mg/dL
HGB URINE DIPSTICK: NEGATIVE
Ketones, ur: NEGATIVE mg/dL
LEUKOCYTES UA: NEGATIVE
Nitrite: NEGATIVE
Protein, ur: NEGATIVE mg/dL
Specific Gravity, Urine: 1.013 (ref 1.005–1.030)
pH: 5 (ref 5.0–8.0)

## 2015-06-08 LAB — PROTIME-INR
INR: 2.16
INR: 2.05
Prothrombin Time: 24.2 seconds — ABNORMAL HIGH (ref 11.4–15.0)
Prothrombin Time: 23.3 seconds — ABNORMAL HIGH (ref 11.4–15.0)

## 2015-06-08 LAB — CBC
HEMATOCRIT: 37.4 % (ref 35.0–47.0)
Hemoglobin: 11.6 g/dL — ABNORMAL LOW (ref 12.0–16.0)
MCH: 26.9 pg (ref 26.0–34.0)
MCHC: 31.1 g/dL — AB (ref 32.0–36.0)
MCV: 86.5 fL (ref 80.0–100.0)
PLATELETS: 466 10*3/uL — AB (ref 150–440)
RBC: 4.32 MIL/uL (ref 3.80–5.20)
RDW: 18 % — AB (ref 11.5–14.5)
WBC: 8.2 10*3/uL (ref 3.6–11.0)

## 2015-06-08 LAB — TROPONIN I: Troponin I: 0.03 ng/mL (ref <0.031)

## 2015-06-08 LAB — LIPASE, BLOOD: LIPASE: 13 U/L — AB (ref 22–51)

## 2015-06-08 MED ORDER — IOHEXOL 300 MG/ML  SOLN
75.0000 mL | Freq: Once | INTRAMUSCULAR | Status: AC | PRN
  Administered 2015-06-08: 75 mL via INTRAVENOUS

## 2015-06-08 MED ORDER — VITAMIN K1 10 MG/ML IJ SOLN
10.0000 mg | Freq: Once | INTRAVENOUS | Status: AC
  Administered 2015-06-08: 10 mg via INTRAVENOUS
  Filled 2015-06-08: qty 1

## 2015-06-08 MED ORDER — DIPHENHYDRAMINE HCL 50 MG/ML IJ SOLN
50.0000 mg | Freq: Once | INTRAMUSCULAR | Status: AC
  Administered 2015-06-08: 50 mg via INTRAVENOUS
  Filled 2015-06-08: qty 1

## 2015-06-08 MED ORDER — SODIUM CHLORIDE 0.9 % IV BOLUS (SEPSIS)
1000.0000 mL | Freq: Once | INTRAVENOUS | Status: AC
  Administered 2015-06-08: 1000 mL via INTRAVENOUS

## 2015-06-08 MED ORDER — BARIUM SULFATE 2.1 % PO SUSP
450.0000 mL | ORAL | Status: AC
  Administered 2015-06-08: 450 mL via ORAL

## 2015-06-08 MED ORDER — ONDANSETRON HCL 4 MG/2ML IJ SOLN
INTRAMUSCULAR | Status: AC
  Filled 2015-06-08: qty 2

## 2015-06-08 MED ORDER — ONDANSETRON HCL 4 MG/2ML IJ SOLN
4.0000 mg | Freq: Once | INTRAMUSCULAR | Status: AC
  Administered 2015-06-08: 4 mg via INTRAVENOUS
  Filled 2015-06-08: qty 2

## 2015-06-08 MED ORDER — MORPHINE SULFATE (PF) 2 MG/ML IV SOLN
2.0000 mg | Freq: Once | INTRAVENOUS | Status: DC
  Filled 2015-06-08: qty 1

## 2015-06-08 MED ORDER — ONDANSETRON HCL 4 MG/2ML IJ SOLN
4.0000 mg | Freq: Once | INTRAMUSCULAR | Status: AC | PRN
  Administered 2015-06-08: 4 mg via INTRAVENOUS

## 2015-06-08 MED ORDER — METHYLPREDNISOLONE SODIUM SUCC 40 MG IJ SOLR
40.0000 mg | Freq: Once | INTRAMUSCULAR | Status: AC
  Administered 2015-06-08: 40 mg via INTRAVENOUS
  Filled 2015-06-08: qty 1

## 2015-06-08 NOTE — ED Notes (Signed)
Lord, MD at bedside.

## 2015-06-08 NOTE — ED Notes (Signed)
Pt has G-tube.

## 2015-06-08 NOTE — ED Provider Notes (Signed)
[facility] Emergency Department Provider Note   ____________________________________________  Time seen: 3:45 PM I have reviewed the triage vital signs and the triage nursing note.  HISTORY  Chief Complaint Abdominal Pain   Historian Patient  HPI Diana [name] is a 80 y.o. female who is currently staying at [facility] rehabilitation for what sounds like rehabilitation after pneumonia, after failure to thrive and weight loss and placement of G-tube with the last month or so, who is presenting today due to nausea, vomiting, and abdominal pain which is located in the lower abdomen. Previously was reported the pain was in the epigastrium. Patient states the pain at this point is mild, and previously was moderate. No report of fever. She does think she is dehydrated. Patient states the G-tube was placed due to weight loss and nausea prevented her from eating and gaining weight. She takes Phenergan for nausea.  No chest pain or shortness of breath. No recent fevers. States she still having white mucus which makes her gag.    Past Medical History  Diagnosis Date  . Lupus (systemic lupus erythematosus)   . Pulmonary hypertension   . Pneumonia   . Unspecified menopausal and postmenopausal disorder   . Primary pulmonary hypertension   . Acute glomerulonephritis with other specified pathological lesion in kidney in disease classified elsewhere(580.81)   . Unspecified hypothyroidism   . Shingles (herpes zoster) polyneuropathy March 2013    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  . Hiatal hernia   . DVT (deep venous thrombosis)   . Cancer     Breast  . CHF (congestive heart failure)     Patient Active Problem List   Diagnosis Date Noted  . Scleroderma of esophagus 05/26/2015  . Dehydration 04/13/2015  . Truncal muscle weakness 04/08/2015  . History of fall within past 90 days 04/07/2015  . Decubitus ulcer of sacral region, stage 1 04/06/2015  .  Protein-calorie malnutrition 04/06/2015  . Esophagitis 03/28/2015  . Pneumonia 03/26/2015  . Gastric volvulus 03/24/2015  . Postprocedural fever 03/24/2015  . Nausea with vomiting 03/24/2015  . Pulmonary hypertension 03/12/2015  . Volvulus of stomach 03/11/2015  . Flank pain 12/21/2014  . Dyspnea 11/13/2014  . Herpes zoster 11/10/2014  . Chronic anticoagulation 11/10/2014  . Venous insufficiency of both lower extremities 06/13/2014  . Breast cancer of upper-outer quadrant of left female breast 05/26/2014  . Urinary incontinence 01/15/2014  . Other and unspecified hyperlipidemia 08/21/2013  . H/O recurrent pneumonia 05/26/2013  . Hypertensive pulmonary venous disease 04/23/2013  . Diastolic dysfunction with heart failure 04/23/2013  . Long term current use of anticoagulant therapy 03/27/2013  . DVT, recurrent, lower extremity, chronic 01/07/2013  . Hiatal hernia 01/06/2013  . Renal mass, left 01/06/2013  . Sciatica of right side 11/20/2012  . Post herpetic neuralgia 11/20/2012  . Disseminated zoster 10/31/2012  . Insomnia 11/08/2011  . LUPUS ERYTHEMATOSUS 06/13/2008  . NEUROPATHY-PERIPHERAL 10/06/2006  . Hypothyroidism 09/25/2006  . ANEMIA, B12 DEFICIENCY 09/25/2006  . GERD 09/25/2006  . Systemic lupus erythematosus 09/25/2006  . Diabetes mellitus without complication 123XX123  . SHINGLES, HX OF 09/25/2006  . TOBACCO ABUSE, HX OF 09/25/2006    Past Surgical History  Procedure Laterality Date  . Kyphoplasty N/A 02/10/2015    Procedure: [id number];  Surgeon: Hessie Knows, MD;  Location: [facility] ORS;  Service: Orthopedics;  Laterality: N/A;  . Laparotomy N/A 03/11/2015    Procedure: REDUCTION OF GASTRIC VOLVULUS, SPLEENECTOMY, GASTRIC TUBE PLACEMENT;  Surgeon: Robert Bellow, MD;  Location:  [facility] ORS;  Service: General;  Laterality: N/A;  . Esophagogastroduodenoscopy (egd) with propofol N/A 03/28/2015    Procedure: ESOPHAGOGASTRODUODENOSCOPY (EGD) WITH PROPOFOL;  Surgeon:  Robert Bellow, MD;  Location: [facility] ENDOSCOPY;  Service: Endoscopy;  Laterality: N/A;    Current Outpatient Rx  Name  Route  Sig  Dispense  Refill  . acetaminophen (TYLENOL) 325 MG tablet   Oral   Take 650 mg by mouth every 4 (four) hours as needed for mild pain or fever.         . ALPRAZolam (XANAX) 0.25 MG tablet   Oral   Take 0.25 mg by mouth at bedtime as needed for anxiety.         . Alum & Mag Hydroxide-Simeth (GI COCKTAIL) SUSP suspension   Oral   Take 30 mLs by mouth every 6 (six) hours as needed (for severe reflux). Also has Lidocaine in it.  Shake well.         Marland Kitchen alum hydroxide-mag trisilicate (GAVISCON) [id number] MG CHEW chewable tablet   Oral   Chew 2 tablets by mouth 4 (four) times daily as needed for indigestion or heartburn.         Marland Kitchen azelastine (ASTELIN) 0.1 % nasal spray   Each Nare   Place 1 spray into both nostrils 2 (two) times daily.         . baclofen (LIORESAL) 10 MG tablet   Oral   Take 10 mg by mouth 2 (two) times daily.         . cholecalciferol (VITAMIN D) 400 UNITS TABS tablet   Oral   Take 400 Units by mouth daily.         . cyanocobalamin (,VITAMIN B-12,) 1000 MCG/ML injection   Intramuscular   Inject 1 mL (1,000 mcg total) into the muscle every 30 (thirty) days. Patient taking differently: Inject 1,000 mcg into the muscle every 30 (thirty) days. Pt uses on the 1st of every month.   10 mL   1   . cyclobenzaprine (FLEXERIL) 10 MG tablet   Oral   Take 10 mg by mouth 3 (three) times daily as needed for muscle spasms.         . diclofenac (FLECTOR) 1.3 % PTCH   Transdermal   Place 1 patch onto the skin 2 (two) times daily.         . ferrous sulfate 220 (44 FE) MG/5ML solution   Oral   Take 220 mg by mouth 3 (three) times daily with meals.         . fluticasone (FLONASE) 50 MCG/ACT nasal spray   Each Nare   Place 2 sprays into both nostrils daily as needed for rhinitis.         . Fluticasone-Salmeterol (ADVAIR)  250-50 MCG/DOSE AEPB   Inhalation   Inhale 1 puff into the lungs 2 (two) times daily.         . furosemide (LASIX) 20 MG tablet   Oral   Take 20 mg by mouth daily.         Marland Kitchen gabapentin (NEURONTIN) 400 MG capsule   Oral   Take 400 mg by mouth daily as needed (for nerve pain).         . hydroxychloroquine (PLAQUENIL) 200 MG tablet   Oral   Take 200 mg by mouth daily.         Marland Kitchen ipratropium (ATROVENT) 0.03 % nasal spray   Each Nare   Place 2 sprays into both  nostrils 3 (three) times daily.         Marland Kitchen letrozole (FEMARA) 2.5 MG tablet   Oral   Take 2.5 mg by mouth daily.         Marland Kitchen levocetirizine (XYZAL) 5 MG tablet   Oral   Take 5 mg by mouth daily.         Marland Kitchen loperamide (IMODIUM) 2 MG capsule   Oral   Take 2-4 mg by mouth as needed for diarrhea or loose stools.         . metoCLOPramide (REGLAN) 5 MG tablet   Oral   Take 5 mg by mouth 4 (four) times daily -  before meals and at bedtime.         . mycophenolate (CELLCEPT) 250 MG capsule   Oral   Take 1,000 mg by mouth 2 (two) times daily.         . ondansetron (ZOFRAN-ODT) 4 MG disintegrating tablet   Oral   Take 1 tablet (4 mg total) by mouth every 4 (four) hours as needed for nausea or vomiting.   20 tablet   0   . oxyCODONE-acetaminophen (PERCOCET/ROXICET) 5-325 MG per tablet   Oral   Take 1 tablet by mouth 3 (three) times daily. Pt is able to be given PRN doses in addition to scheduled doses.         . pantoprazole sodium (PROTONIX) 40 mg/20 mL PACK   Per Tube   Place 40 mg into feeding tube 2 (two) times daily.         . potassium chloride 20 MEQ/15ML (10%) SOLN   Oral   Take 20 mEq by mouth 2 (two) times daily.         . predniSONE (DELTASONE) 1 MG tablet   Oral   Take 1 mg by mouth daily. Pt takes in addition to the 5mg  tablet.         . predniSONE (DELTASONE) 5 MG tablet   Oral   Take 5 mg by mouth daily. Pt takes in addition to the 1mg  tablet.         . promethazine  (PHENERGAN) 25 MG suppository   Rectal   Place 25 mg rectally every 4 (four) hours as needed for nausea or vomiting.         . promethazine (PHENERGAN) 25 MG tablet   Oral   Take 6.25-12.5 mg by mouth every 4 (four) hours as needed for nausea or vomiting.          . promethazine (PHENERGAN) 25 MG/ML injection   Intramuscular   Inject 25 mg into the muscle every 4 (four) hours as needed for nausea or vomiting.         . ranitidine (ZANTAC) 300 MG tablet   Oral   Take 300 mg by mouth daily.         Marland Kitchen scopolamine (TRANSDERM-SCOP) 1 MG/3DAYS   Transdermal   Place 1 patch onto the skin every 3 (three) days.         . sucralfate (CARAFATE) 1 GM/10ML suspension   Oral   Take 10 mLs (1 g total) by mouth 4 (four) times daily -  with meals and at bedtime.   420 mL   0   . SYNTHROID 150 MCG tablet   Oral   Take 1 tablet (150 mcg total) by mouth daily.   30 tablet   11     Dispense as written.   . Tadalafil, PAH, (ADCIRCA)  20 MG TABS   Oral   Take 40 mg by mouth daily.         . vitamin C (ASCORBIC ACID) 500 MG tablet   Oral   Take 1,000 mg by mouth daily.         Marland Kitchen warfarin (COUMADIN) 3 MG tablet   Oral   Take 3 mg by mouth daily.         . fluconazole (DIFLUCAN) 40 MG/ML suspension   Oral   Take 2.5 mLs (100 mg total) by mouth daily. Patient not taking: Reported on 06/08/2015   35 mL   0   . HYDROcodone-acetaminophen (NORCO/VICODIN) 5-325 MG per tablet   Oral   Take 1-2 tablets by mouth every 4 (four) hours as needed for moderate pain. Patient not taking: Reported on 06/08/2015   30 tablet   0     Allergies Amoxicillin; Cefuroxime; Contrast media; Cephalexin; Sulfonamide derivatives; Tramadol hcl; and Venlafaxine  History reviewed. No pertinent family history.  Social History Social History  Substance Use Topics  . Smoking status: Former Smoker    Quit date: 05/25/1991  . Smokeless tobacco: Never Used  . Alcohol Use: Yes     Comment: rare     Review of Systems  Constitutional: Negative for fever. Eyes: Negative for visual changes. ENT: Positive for dry mouth Cardiovascular: Negative for chest pain. Respiratory: Negative for shortness of breath. Gastrointestinal: Positive for diarrhea, nonbloody. Genitourinary: Negative for dysuria. Musculoskeletal: Negative for back pain. Skin: Negative for rash. Neurological: Negative for headache. 10 point Review of Systems otherwise negative ____________________________________________   PHYSICAL EXAM:  VITAL SIGNS: ED Triage Vitals  Enc Vitals Group     BP 06/08/15 1405 105/62 mmHg     Pulse Rate 06/08/15 1405 115     Resp 06/08/15 1405 18     Temp 06/08/15 1405 98.5 F (36.9 C)     Temp Source 06/08/15 1405 Oral     SpO2 06/08/15 1405 95 %     Weight 06/08/15 1405 124 lb (56.246 kg)     Height 06/08/15 1405 5\' 3"  (1.6 m)     Head Cir --      Peak Flow --      Pain Score 06/08/15 1319 7     Pain Loc --      Pain Edu? --      Excl. in [place]? --      Constitutional: Alert and oriented. Well appearing and in no distress. Eyes: Conjunctivae are normal. PERRL. Normal extraocular movements. ENT   Head: Normocephalic and atraumatic.   Nose: No congestion/rhinnorhea.   Mouth/Throat: Mucous membranes are moderately to severely dry..   Neck: No stridor. Cardiovascular/Chest: Regular, tachycardic.  No murmurs, rubs, or gallops. Respiratory: Normal respiratory effort without tachypnea nor retractions. Breath sounds are clear and equal bilaterally. No wheezes/rales/rhonchi. Gastrointestinal: Soft. No distention, no guarding, no rebound. Moderate tenderness lower abdomen suprapubic and right-sided. G-tube at abdomen without any evidence of cellulitis. Genitourinary/rectal:Deferred Musculoskeletal: Nontender with normal range of motion in all extremities. No joint effusions.  No lower extremity tenderness.  No edema. Neurologic:  Normal speech and language. No gross  or focal neurologic deficits are appreciated. Skin:  Skin is warm, dry and intact. No rash noted. Psychiatric: Mood and affect are normal. Speech and behavior are normal. Patient exhibits appropriate insight and judgment.  ____________________________________________   EKG I, Lisa Roca, MD, the attending physician have personally viewed and interpreted all ECGs.  116 beats per apparent  sinus tachycardia with frequent PVC. Narrow QRS. Normal axis. Nonspecific T-wave ____________________________________________  LABS (pertinent positives/negatives)  Lipase 13 Conference metabolic panel significant for sodium 151, chloride 117, glucose 160, BUN 56, creatinine 1.3 White blood cell count 8.2, hemoglobin 11.6, platelet count 466 Urinalysis: Negative INR 2.05 Troponin 0.03 ____________________________________________  RADIOLOGY All Xrays were viewed by me. Imaging interpreted by Radiologist.  CT abdomen and pelvis with contrast:  FINDINGS: Lung bases demonstrate mild bibasilar opacification right worse than left likely atelectasis with slight improvement. Mild stable cardiomegaly. A moderate portion of the stomach persists above the diaphragm unchanged. Small amount contrast is present within the portion of the stomach above the diaphragm.  Abdominal images demonstrate a percutaneous gastrostomy tube in adequate position. Evidence of previous splenectomy. The gallbladder, pancreas and adrenal glands are within normal. Kidneys normal in size without hydronephrosis or nephrolithiasis. 2.7 cm left renal cyst unchanged. Couple subcentimeter bilateral renal cortical hypodensities too small to characterize but likely a cysts and unchanged. The ureter is within normal.  Mild calcified plaque over the abdominal aorta.  The appendix is normal. There is moderate diverticulosis of the colon without active inflammation. Small bowel is within normal. Mesentery is within normal. No  significant free fluid or focal inflammatory change.  Pelvic images demonstrate the bladder, or uterus, ovaries and rectum to be within normal.  Examination demonstrates moderate spondylosis of the spine with moderate curvature of the thoracolumbar spine convex right and evidence of 2 previous kyphoplasties of L1 on L2 unchanged. Moderate degenerative change of the hips.  IMPRESSION: No acute findings in the abdomen/ pelvis.  Moderate portion of the stomach remains above the diaphragm unchanged post gastropexy. Gastrostomy tube in adequate position.  Bibasilar opacification slightly improved likely atelectasis.  Left renal cyst unchanged with bilateral subcentimeter renal cortical hypodensities too small to characterize but likely cysts.  Diverticulosis of the colon without active inflammation.  Other chronic findings and postsurgical changes as described Unchanged. __________________________________________  PROCEDURES  Procedure(s) performed: None  Critical Care performed: None  ____________________________________________   ED COURSE / ASSESSMENT AND PLAN  CONSULTATIONS: Face-to-face with hospitalist for admission  Pertinent labs & imaging results that were available during my care of the patient were reviewed by me and considered in my medical decision making (see chart for details).  Choice proceed with CT scan given the patient continued to complain of moderate lower abdominal tenderness. She reports a history of hives due to CT contrast dye, for which she's been premedicated with Benadryl and the past and tolerated it well.  Niece and son showed up to the bedside and provided additional history that the patient was sent in for rectal bleeding. Patient did say yes she had an episode of rectal bleeding today. She is a history of hemorrhoids, but now bothering her.  Hemoglobin is actually up from prior. Patient was initially tachycardic, but her rate came down into  the 90s although she did change into bigeminy rhythm. No chest pain or trouble breathing.   ----------------------------------------- 10:52 PM on 06/08/2015 -----------------------------------------  Vital signs have remained stable. CT abdomen shows no acute source for pain. Patient be admitted for observation for GI bleeding, presumed clinically to be lower GI bleeding. Patient was given vitamin K for GI bleeding with INR of 2.0.   Patient / Family / Caregiver informed of clinical course, medical decision-making process, and agree with plan.   I discussed return precautions, follow-up instructions, and discharged instructions with patient and/or family.  ___________________________________________   FINAL CLINICAL  IMPRESSION(S) / ED DIAGNOSES   Final diagnoses:  Acute lower GI bleeding  Lower abdominal pain       Lisa Roca, MD 06/08/15 [phone]

## 2015-06-08 NOTE — ED Notes (Signed)
Pt staying at [facility], brought in by EMA.  Reports epigastric pain going up to throat.  Pt has feeding tube, no redness or swelling noted to site.

## 2015-06-09 DIAGNOSIS — K210 Gastro-esophageal reflux disease with esophagitis: Secondary | ICD-10-CM | POA: Diagnosis present

## 2015-06-09 DIAGNOSIS — M270 Developmental disorders of jaws: Secondary | ICD-10-CM | POA: Diagnosis not present

## 2015-06-09 DIAGNOSIS — B370 Candidal stomatitis: Secondary | ICD-10-CM | POA: Diagnosis present

## 2015-06-09 DIAGNOSIS — I279 Pulmonary heart disease, unspecified: Secondary | ICD-10-CM | POA: Diagnosis not present

## 2015-06-09 DIAGNOSIS — M329 Systemic lupus erythematosus, unspecified: Secondary | ICD-10-CM | POA: Diagnosis present

## 2015-06-09 DIAGNOSIS — E039 Hypothyroidism, unspecified: Secondary | ICD-10-CM | POA: Diagnosis present

## 2015-06-09 DIAGNOSIS — Z882 Allergy status to sulfonamides status: Secondary | ICD-10-CM | POA: Diagnosis not present

## 2015-06-09 DIAGNOSIS — Z79891 Long term (current) use of opiate analgesic: Secondary | ICD-10-CM | POA: Diagnosis not present

## 2015-06-09 DIAGNOSIS — Z7901 Long term (current) use of anticoagulants: Secondary | ICD-10-CM | POA: Diagnosis not present

## 2015-06-09 DIAGNOSIS — K2901 Acute gastritis with bleeding: Secondary | ICD-10-CM | POA: Diagnosis present

## 2015-06-09 DIAGNOSIS — K449 Diaphragmatic hernia without obstruction or gangrene: Secondary | ICD-10-CM | POA: Diagnosis present

## 2015-06-09 DIAGNOSIS — Z7952 Long term (current) use of systemic steroids: Secondary | ICD-10-CM | POA: Diagnosis not present

## 2015-06-09 DIAGNOSIS — K625 Hemorrhage of anus and rectum: Secondary | ICD-10-CM | POA: Diagnosis not present

## 2015-06-09 DIAGNOSIS — E870 Hyperosmolality and hypernatremia: Secondary | ICD-10-CM | POA: Diagnosis present

## 2015-06-09 DIAGNOSIS — R1032 Left lower quadrant pain: Secondary | ICD-10-CM | POA: Diagnosis not present

## 2015-06-09 DIAGNOSIS — E860 Dehydration: Secondary | ICD-10-CM | POA: Diagnosis present

## 2015-06-09 DIAGNOSIS — I825Z1 Chronic embolism and thrombosis of unspecified deep veins of right distal lower extremity: Secondary | ICD-10-CM | POA: Diagnosis present

## 2015-06-09 DIAGNOSIS — K2951 Unspecified chronic gastritis with bleeding: Secondary | ICD-10-CM | POA: Diagnosis present

## 2015-06-09 DIAGNOSIS — Z87891 Personal history of nicotine dependence: Secondary | ICD-10-CM | POA: Diagnosis not present

## 2015-06-09 DIAGNOSIS — Z881 Allergy status to other antibiotic agents status: Secondary | ICD-10-CM | POA: Diagnosis not present

## 2015-06-09 DIAGNOSIS — Z431 Encounter for attention to gastrostomy: Secondary | ICD-10-CM | POA: Diagnosis not present

## 2015-06-09 DIAGNOSIS — K922 Gastrointestinal hemorrhage, unspecified: Secondary | ICD-10-CM | POA: Diagnosis not present

## 2015-06-09 DIAGNOSIS — R1013 Epigastric pain: Secondary | ICD-10-CM | POA: Diagnosis not present

## 2015-06-09 DIAGNOSIS — Z79899 Other long term (current) drug therapy: Secondary | ICD-10-CM | POA: Diagnosis not present

## 2015-06-09 DIAGNOSIS — K219 Gastro-esophageal reflux disease without esophagitis: Secondary | ICD-10-CM | POA: Diagnosis not present

## 2015-06-09 DIAGNOSIS — I82411 Acute embolism and thrombosis of right femoral vein: Secondary | ICD-10-CM | POA: Diagnosis not present

## 2015-06-09 DIAGNOSIS — Z931 Gastrostomy status: Secondary | ICD-10-CM | POA: Diagnosis not present

## 2015-06-09 DIAGNOSIS — K224 Dyskinesia of esophagus: Secondary | ICD-10-CM | POA: Diagnosis not present

## 2015-06-09 DIAGNOSIS — E119 Type 2 diabetes mellitus without complications: Secondary | ICD-10-CM | POA: Diagnosis present

## 2015-06-09 DIAGNOSIS — K5730 Diverticulosis of large intestine without perforation or abscess without bleeding: Secondary | ICD-10-CM | POA: Diagnosis present

## 2015-06-09 DIAGNOSIS — R1012 Left upper quadrant pain: Secondary | ICD-10-CM | POA: Diagnosis not present

## 2015-06-09 DIAGNOSIS — Z888 Allergy status to other drugs, medicaments and biological substances status: Secondary | ICD-10-CM | POA: Diagnosis not present

## 2015-06-09 DIAGNOSIS — E538 Deficiency of other specified B group vitamins: Secondary | ICD-10-CM | POA: Diagnosis present

## 2015-06-09 DIAGNOSIS — Z48815 Encounter for surgical aftercare following surgery on the digestive system: Secondary | ICD-10-CM | POA: Diagnosis not present

## 2015-06-09 DIAGNOSIS — K208 Other esophagitis: Secondary | ICD-10-CM | POA: Diagnosis not present

## 2015-06-09 DIAGNOSIS — Z853 Personal history of malignant neoplasm of breast: Secondary | ICD-10-CM | POA: Diagnosis not present

## 2015-06-09 DIAGNOSIS — M6281 Muscle weakness (generalized): Secondary | ICD-10-CM | POA: Diagnosis not present

## 2015-06-09 DIAGNOSIS — Z91041 Radiographic dye allergy status: Secondary | ICD-10-CM | POA: Diagnosis not present

## 2015-06-09 DIAGNOSIS — I509 Heart failure, unspecified: Secondary | ICD-10-CM | POA: Diagnosis present

## 2015-06-09 DIAGNOSIS — B3781 Candidal esophagitis: Secondary | ICD-10-CM | POA: Diagnosis present

## 2015-06-09 DIAGNOSIS — L930 Discoid lupus erythematosus: Secondary | ICD-10-CM | POA: Diagnosis present

## 2015-06-09 DIAGNOSIS — Z95828 Presence of other vascular implants and grafts: Secondary | ICD-10-CM | POA: Diagnosis not present

## 2015-06-09 DIAGNOSIS — I80291 Phlebitis and thrombophlebitis of other deep vessels of right lower extremity: Secondary | ICD-10-CM | POA: Diagnosis not present

## 2015-06-09 DIAGNOSIS — I270 Primary pulmonary hypertension: Secondary | ICD-10-CM | POA: Diagnosis present

## 2015-06-09 DIAGNOSIS — N958 Other specified menopausal and perimenopausal disorders: Secondary | ICD-10-CM | POA: Diagnosis present

## 2015-06-09 DIAGNOSIS — E876 Hypokalemia: Secondary | ICD-10-CM | POA: Diagnosis present

## 2015-06-09 LAB — GLUCOSE, CAPILLARY
GLUCOSE-CAPILLARY: 98 mg/dL (ref 65–99)
Glucose-Capillary: 109 mg/dL — ABNORMAL HIGH (ref 65–99)
Glucose-Capillary: 107 mg/dL — ABNORMAL HIGH (ref 65–99)
Glucose-Capillary: 86 mg/dL (ref 65–99)

## 2015-06-09 LAB — BASIC METABOLIC PANEL
ANION GAP: 9 (ref 5–15)
BUN: 46 mg/dL — AB (ref 6–20)
CALCIUM: 8.6 mg/dL — AB (ref 8.9–10.3)
CO2: 24 mmol/L (ref 22–32)
Chloride: 120 mmol/L — ABNORMAL HIGH (ref 101–111)
Creatinine, Ser: 1.16 mg/dL — ABNORMAL HIGH (ref 0.44–1.00)
GFR calc Af Amer: 51 mL/min — ABNORMAL LOW (ref >60)
GFR calc non Af Amer: 44 mL/min — ABNORMAL LOW (ref >60)
GLUCOSE: 126 mg/dL — AB (ref 65–99)
Potassium: 3.6 mmol/L (ref 3.5–5.1)
Sodium: 153 mmol/L — ABNORMAL HIGH (ref 135–145)

## 2015-06-09 LAB — CBC
HCT: 32.6 % — ABNORMAL LOW (ref 35.0–47.0)
HEMOGLOBIN: 10.3 g/dL — AB (ref 12.0–16.0)
MCH: 28 pg (ref 26.0–34.0)
MCHC: 31.7 g/dL — AB (ref 32.0–36.0)
MCV: 88.2 fL (ref 80.0–100.0)
Platelets: 379 10*3/uL (ref 150–440)
RBC: 3.69 MIL/uL — ABNORMAL LOW (ref 3.80–5.20)
RDW: 18.5 % — ABNORMAL HIGH (ref 11.5–14.5)
WBC: 5.5 10*3/uL (ref 3.6–11.0)

## 2015-06-09 LAB — PROTIME-INR
INR: 1.62
PROTHROMBIN TIME: 19.4 s — AB (ref 11.4–15.0)

## 2015-06-09 LAB — MRSA PCR SCREENING: MRSA BY PCR: NEGATIVE

## 2015-06-09 MED ORDER — ACETAMINOPHEN 650 MG RE SUPP: 650.0000 mg | Freq: Four times a day (QID) | RECTAL | Status: DC | PRN

## 2015-06-09 MED ORDER — CETYLPYRIDINIUM CHLORIDE 0.05 % MT LIQD
7.0000 mL | Freq: Two times a day (BID) | OROMUCOSAL | Status: DC
  Administered 2015-06-09 – 2015-06-13 (×10): 7 mL via OROMUCOSAL

## 2015-06-09 MED ORDER — MYCOPHENOLATE MOFETIL 250 MG PO CAPS
1000.0000 mg | ORAL_CAPSULE | Freq: Two times a day (BID) | ORAL | Status: DC
  Administered 2015-06-09: 1000 mg via ORAL
  Filled 2015-06-09 (×4): qty 4

## 2015-06-09 MED ORDER — SODIUM CHLORIDE 0.9 % IV SOLN
INTRAVENOUS | Status: AC
  Administered 2015-06-09: 04:00:00 via INTRAVENOUS

## 2015-06-09 MED ORDER — CIPROFLOXACIN HCL 250 MG PO TABS
500.0000 mg | ORAL_TABLET | Freq: Two times a day (BID) | ORAL | Status: AC
Start: 2015-06-09 — End: 2015-06-12
  Administered 2015-06-09 – 2015-06-12 (×6): 500 mg via ORAL
  Filled 2015-06-09 (×3): qty 2
  Filled 2015-06-09: qty 1
  Filled 2015-06-09 (×2): qty 2

## 2015-06-09 MED ORDER — MORPHINE SULFATE (PF) 2 MG/ML IV SOLN: 2.0000 mg | INTRAVENOUS | Status: DC | PRN

## 2015-06-09 MED ORDER — PANTOPRAZOLE SODIUM 40 MG IV SOLR
40.0000 mg | Freq: Two times a day (BID) | INTRAVENOUS | Status: DC
  Administered 2015-06-12 – 2015-06-13 (×2): 40 mg via INTRAVENOUS
  Filled 2015-06-09 (×2): qty 40

## 2015-06-09 MED ORDER — SODIUM CHLORIDE 0.9 % IJ SOLN
3.0000 mL | Freq: Two times a day (BID) | INTRAMUSCULAR | Status: DC
  Administered 2015-06-09 – 2015-06-13 (×5): 3 mL via INTRAVENOUS

## 2015-06-09 MED ORDER — DICLOFENAC EPOLAMINE 1.3 % TD PTCH
1.0000 | MEDICATED_PATCH | Freq: Two times a day (BID) | TRANSDERMAL | Status: DC
  Administered 2015-06-09 – 2015-06-13 (×7): 1 via TRANSDERMAL
  Filled 2015-06-09 (×9): qty 1

## 2015-06-09 MED ORDER — PREDNISONE 5 MG PO TABS
5.0000 mg | ORAL_TABLET | Freq: Every day | ORAL | Status: DC
  Administered 2015-06-09 – 2015-06-13 (×4): 5 mg via ORAL
  Filled 2015-06-09 (×5): qty 1

## 2015-06-09 MED ORDER — PREDNISONE 1 MG PO TABS
1.0000 mg | ORAL_TABLET | Freq: Every day | ORAL | Status: DC
Start: 2015-06-09 — End: 2015-06-13
  Administered 2015-06-10 – 2015-06-13 (×3): 1 mg via ORAL
  Filled 2015-06-09 (×5): qty 1

## 2015-06-09 MED ORDER — TADALAFIL (PAH) 20 MG PO TABS
40.0000 mg | ORAL_TABLET | Freq: Every day | ORAL | Status: DC
  Filled 2015-06-09 (×2): qty 2

## 2015-06-09 MED ORDER — ONDANSETRON HCL 4 MG/2ML IJ SOLN
4.0000 mg | Freq: Four times a day (QID) | INTRAMUSCULAR | Status: DC | PRN
  Administered 2015-06-09 – 2015-06-10 (×3): 4 mg via INTRAVENOUS
  Filled 2015-06-09 (×3): qty 2

## 2015-06-09 MED ORDER — ONDANSETRON HCL 4 MG PO TABS
4.0000 mg | ORAL_TABLET | Freq: Four times a day (QID) | ORAL | Status: DC | PRN
  Administered 2015-06-10: 19:00:00 4 mg via ORAL
  Filled 2015-06-09: qty 1

## 2015-06-09 MED ORDER — SODIUM CHLORIDE 0.45 % IV SOLN
INTRAVENOUS | Status: DC
  Administered 2015-06-09 – 2015-06-11 (×4): via INTRAVENOUS

## 2015-06-09 MED ORDER — LETROZOLE 2.5 MG PO TABS
2.5000 mg | ORAL_TABLET | Freq: Every day | ORAL | Status: DC
  Administered 2015-06-09 – 2015-06-13 (×4): 2.5 mg via ORAL
  Filled 2015-06-09 (×6): qty 1

## 2015-06-09 MED ORDER — INSULIN ASPART 100 UNIT/ML ~~LOC~~ SOLN
0.0000 [IU] | Freq: Three times a day (TID) | SUBCUTANEOUS | Status: DC
  Administered 2015-06-12: 08:00:00 2 [IU] via SUBCUTANEOUS
  Administered 2015-06-13: 3 [IU] via SUBCUTANEOUS
  Filled 2015-06-09: qty 2
  Filled 2015-06-09: qty 3

## 2015-06-09 MED ORDER — TADALAFIL (PAH) 20 MG PO TABS
40.0000 mg | ORAL_TABLET | Freq: Every day | ORAL | Status: DC
  Administered 2015-06-09 – 2015-06-13 (×4): 40 mg via ORAL

## 2015-06-09 MED ORDER — MOMETASONE FURO-FORMOTEROL FUM 100-5 MCG/ACT IN AERO
2.0000 | INHALATION_SPRAY | Freq: Two times a day (BID) | RESPIRATORY_TRACT | Status: DC
  Administered 2015-06-09 – 2015-06-13 (×9): 2 via RESPIRATORY_TRACT
  Filled 2015-06-09: qty 8.8

## 2015-06-09 MED ORDER — ALPRAZOLAM 0.25 MG PO TABS
0.2500 mg | ORAL_TABLET | Freq: Every evening | ORAL | Status: DC | PRN
Start: 2015-06-09 — End: 2015-06-13
  Administered 2015-06-09 – 2015-06-12 (×2): 0.25 mg via ORAL
  Filled 2015-06-09 (×3): qty 1

## 2015-06-09 MED ORDER — SODIUM CHLORIDE 0.9 % IV SOLN
8.0000 mg/h | INTRAVENOUS | Status: DC
  Administered 2015-06-09: 8 mg/h via INTRAVENOUS
  Filled 2015-06-09: qty 80

## 2015-06-09 MED ORDER — PANTOPRAZOLE SODIUM 40 MG IV SOLR
80.0000 mg | Freq: Once | INTRAVENOUS | Status: AC
  Administered 2015-06-09: 80 mg via INTRAVENOUS
  Filled 2015-06-09: qty 80

## 2015-06-09 MED ORDER — LEVOTHYROXINE SODIUM 75 MCG PO TABS
150.0000 ug | ORAL_TABLET | Freq: Every day | ORAL | Status: DC
  Administered 2015-06-09 – 2015-06-13 (×5): 150 ug via ORAL
  Filled 2015-06-09 (×5): qty 2

## 2015-06-09 MED ORDER — ACETAMINOPHEN 325 MG PO TABS: 650.0000 mg | ORAL_TABLET | Freq: Four times a day (QID) | ORAL | Status: DC | PRN

## 2015-06-09 MED ORDER — HYDROXYCHLOROQUINE SULFATE 200 MG PO TABS
200.0000 mg | ORAL_TABLET | Freq: Every day | ORAL | Status: DC
  Administered 2015-06-09 – 2015-06-13 (×4): 200 mg via ORAL
  Filled 2015-06-09 (×5): qty 1

## 2015-06-09 MED ORDER — MYCOPHENOLATE MOFETIL 250 MG PO CAPS
1000.0000 mg | ORAL_CAPSULE | Freq: Two times a day (BID) | ORAL | Status: DC
  Administered 2015-06-09 – 2015-06-13 (×7): 1000 mg via ORAL
  Filled 2015-06-09: qty 4

## 2015-06-09 NOTE — Progress Notes (Signed)
Dr. Posey Pronto paged. Pt. Requesting her pain patch, which is not ordered. MD gave verbal order to give pain patch as patient takes at home.

## 2015-06-09 NOTE — Progress Notes (Signed)
Pt has a flector patch to her lower right back, placed 9/19 prior to admission.

## 2015-06-09 NOTE — Consult Note (Signed)
Please see full GI consult by [name]. Patient seen and examined, chart reviewed. Patient with a history of gastric volvulus undergoing gastropexy via PEG with partial reduction of the intrathoracic stomach back into the abdominal cavity on about 03/11/2015. Subsequently she has had issues with nutrition and tolerating food. She did have a episode of Candida esophagitis in the interim however her last EGD was done at [facility].That procedure was done on 04/16/2015 with the findings of a "gaping" lower esophageal sphincter allowing free fluid reflux and transit into the proximal esophagus. She also had a grade D erosive esophagitis. The duodenum appeared normal. He had a 4 cm hiatal hernia recorded. Patient also had a hospitalization at [facility] for pneumonia previous to the hospitalization for anemia at that institution.  She has had findings of recurrent anemia and Hemoccult-positive stool. She has a history of adenomatous colon polyps with her last procedure being done 06/04/2008. Recommendation at that time was to repeat in 3 years. A recent upper GI series done post surgery for the gastric volvulus indicated a marked emptying delay, review of the film shows the hiatal hernia catching with delay of material into the main body of the stomach below the diaphragm.  She has had 2 Hemoccults studies since she was admitted for this hospitalization one being positive one being negative.  Will recheck INR in the morning and arrange for EGD in the afternoon if below 1.3. At some point it may be useful to do a gastric emptying study. Continue PPI as you are. Further recommendations to follow.

## 2015-06-09 NOTE — Progress Notes (Signed)
Spoke with MD during rounds. Per Dr. Posey Pronto, [place] to transfer pt to med surg floor other than telemetry.

## 2015-06-09 NOTE — Progress Notes (Signed)
Pt transferring to room 124.  Son, Kasandra Knudsen made aware.  Pt's belongings packed and ready to move.  Will call report to 1C when ready.   Jessee Avers

## 2015-06-09 NOTE — Clinical Documentation Improvement (Signed)
Hospitalist  Abnormal Lab/Test Results:  **Sodium values 151 and 153 this admission*  Possible Clinical Conditions associated with below indicators  *Hypernatremia  *Hyponatremia  *Normal sodium values  Other Condition  Cannot Clinically Determine   Supporting Information: *06/08/2015 Patient was ordered 1,022mlL bolus sodium chloride,  *06/09/2015 Patient was ordered sodium chloride infusion of 36mL/hr, from 0145, discontinued at 1144*    Please exercise your independent, professional judgment when responding. A specific answer is not anticipated or expected.   Thank You,  Franky Macho, RN [place] [phone]

## 2015-06-09 NOTE — Consult Note (Signed)
Consultation  Referring Diana [name]:     Dr Fritzi Mandes Admit date: 06/08/17 Consult date    06/08/17     Reason for Consultation:     Rectal bleeding.          HPI:   Diana [name] is a 80 y.o. female with history recent gastric volvulous 03/11/15 requiring repair at that time-also had spleenectomy then, done by Dr Fleet Contras. Has continued with some upper abdominal pain and some dyspepsia since. Further GI evaluation has included EGD 7/16 and UGIS by same Lerin Jech. EGD demonstrated La Grade C esophagitis and a moderate hiatal hernia. UGIS with marked emptying delay- stomach only emptied when patient was in semi-upright positiion.  She is unclear on follow up details regarding this but reports she was hospitalized at [facility] last month for pneumonia,  and underwent feeding tube placement by Dr Katherina Mires at [facility], as she was unable to maintain sufficient nutrition via oral intake as she continued to have problems with vomiting. Says this has improved since the feeding tube placment.  Current symptoms include LUQ and left lower quadrant discomfort, but mostly LLQ. States stools have been black and green- were loose over the weekend, but has not defecated since Sunday. Passing flatus well. Reports some heartburn on and off, Denies NSAIDs, however is on flector patch. She is also on Fe. Denies further GI complaints, however has been having difficulty with recurrent anemia, had transfusion for hgb 7.6 last Monday. Has been residing at [facility] place for SNF following illness. OP Gi meds include Pantoprazole, Ranitidine, Reglan, Pheneragn, GI cocktial, Gaviscon, Zofran, Carafate, Scopolamine patch. She has been on recent antibiotics and steroids for her pneumonia.  Noted she has also been on coumadin- last dose  9/18 at 5pm  PREVIOUS ENDOSCOPIES:     Colonoscopy about 34yr ago per patient report. States she has had colon polyps in past.   EGD as above.         Past Medical History  Diagnosis Date  . Lupus  (systemic lupus erythematosus)   . Pulmonary hypertension   . Pneumonia   . Unspecified menopausal and postmenopausal disorder   . Primary pulmonary hypertension   . Acute glomerulonephritis with other specified pathological lesion in kidney in disease classified elsewhere(580.81)   . Unspecified hypothyroidism   . Shingles (herpes zoster) polyneuropathy March 2013    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  . Hiatal hernia   . DVT (deep venous thrombosis)   . Cancer     Breast  . CHF (congestive heart failure)   . Renal insufficiency     Past Surgical History  Procedure Laterality Date  . Kyphoplasty N/A 02/10/2015    Procedure: [id number];  Surgeon: Hessie Knows, MD;  Location: [facility] ORS;  Service: Orthopedics;  Laterality: N/A;  . Laparotomy N/A 03/11/2015    Procedure: REDUCTION OF GASTRIC VOLVULUS, SPLEENECTOMY, GASTRIC TUBE PLACEMENT;  Surgeon: Robert Bellow, MD;  Location: [facility] ORS;  Service: General;  Laterality: N/A;  . Esophagogastroduodenoscopy (egd) with propofol N/A 03/28/2015    Procedure: ESOPHAGOGASTRODUODENOSCOPY (EGD) WITH PROPOFOL;  Surgeon: Robert Bellow, MD;  Location: [facility] ENDOSCOPY;  Service: Endoscopy;  Laterality: N/A;    Family History  Problem Relation Age of Onset  . Colon cancer Mother   . Alcohol abuse Sister   . Breast cancer Sister   . Cancer Brother   . Lung cancer Son      Social History  Substance Use Topics  . Smoking status: Former Smoker  Quit date: 05/25/1991  . Smokeless tobacco: Never Used  . Alcohol Use: Yes     Comment: rare    Prior to Admission medications   Medication Sig Start Date End Date Taking? Authorizing Rebakah Cokley  acetaminophen (TYLENOL) 325 MG tablet Take 650 mg by mouth every 4 (four) hours as needed for mild pain or fever.   Yes Historical Ethlyn Alto, MD  ALPRAZolam Duanne Moron) 0.25 MG tablet Take 0.25 mg by mouth at bedtime as needed for anxiety.   Yes Historical Ardian Haberland, MD  Alum & Mag Hydroxide-Simeth (GI  COCKTAIL) SUSP suspension Take 30 mLs by mouth every 6 (six) hours as needed (for severe reflux). Also has Lidocaine in it.  Shake well.   Yes Historical Ramin Zoll, MD  alum hydroxide-mag trisilicate (GAVISCON) [id number] MG CHEW chewable tablet Chew 2 tablets by mouth 4 (four) times daily as needed for indigestion or heartburn.   Yes Historical Trasean Delima, MD  azelastine (ASTELIN) 0.1 % nasal spray Place 1 spray into both nostrils 2 (two) times daily.   Yes Historical Lanyah Spengler, MD  baclofen (LIORESAL) 10 MG tablet Take 10 mg by mouth 2 (two) times daily.   Yes Historical Yohana Bartha, MD  cyanocobalamin (,VITAMIN B-12,) 1000 MCG/ML injection Inject 1 mL (1,000 mcg total) into the muscle every 30 (thirty) days. Patient taking differently: Inject 1,000 mcg into the muscle every 30 (thirty) days. Pt uses on the 1st of every month. 11/08/11  Yes Crecencio Mc, MD  diclofenac (FLECTOR) 1.3 % PTCH Place 1 patch onto the skin 2 (two) times daily.   Yes Historical Berdina Cheever, MD  ferrous sulfate 220 (44 FE) MG/5ML solution Take 220 mg by mouth 3 (three) times daily with meals.   Yes Historical Brahm Barbeau, MD  fluticasone (FLONASE) 50 MCG/ACT nasal spray Place 2 sprays into both nostrils daily as needed for rhinitis.   Yes Historical Kimimila Tauzin, MD  Fluticasone-Salmeterol (ADVAIR) 250-50 MCG/DOSE AEPB Inhale 1 puff into the lungs 2 (two) times daily.   Yes Historical Mekisha Bittel, MD  furosemide (LASIX) 20 MG tablet Take 20 mg by mouth daily.   Yes Historical Thelda Gagan, MD  hydroxychloroquine (PLAQUENIL) 200 MG tablet Take 200 mg by mouth daily.   Yes Historical Latrenda Irani, MD  letrozole (FEMARA) 2.5 MG tablet Take 2.5 mg by mouth daily.   Yes Historical Labrian Torregrossa, MD  levocetirizine (XYZAL) 5 MG tablet Take 5 mg by mouth daily.   Yes Historical Jahid Weida, MD  loperamide (IMODIUM) 2 MG capsule Take 2-4 mg by mouth as needed for diarrhea or loose stools.   Yes Historical Alvine Mostafa, MD  metoCLOPramide (REGLAN) 5 MG tablet Take 5 mg by  mouth 4 (four) times daily -  before meals and at bedtime.   Yes Historical Bernadette Gores, MD  mycophenolate (CELLCEPT) 250 MG capsule Take 1,000 mg by mouth 2 (two) times daily.   Yes Historical Katana Berthold, MD  ondansetron (ZOFRAN-ODT) 4 MG disintegrating tablet Take 1 tablet (4 mg total) by mouth every 4 (four) hours as needed for nausea or vomiting. 03/20/15  Yes Robert Bellow, MD  oxyCODONE-acetaminophen (PERCOCET/ROXICET) 5-325 MG per tablet Take 1 tablet by mouth 3 (three) times daily. Pt is able to be given PRN doses in addition to scheduled doses.   Yes Historical Jasyah Theurer, MD  pantoprazole sodium (PROTONIX) 40 mg/20 mL PACK Place 40 mg into feeding tube 2 (two) times daily.   Yes Historical Dafina Suk, MD  potassium chloride 20 MEQ/15ML (10%) SOLN Take 20 mEq by mouth 2 (two) times daily.   Yes Historical  Zosia Lucchese, MD  predniSONE (DELTASONE) 5 MG tablet Take 5 mg by mouth daily. Pt takes in addition to the 1mg  tablet.   Yes Historical Micaylah Bertucci, MD  promethazine (PHENERGAN) 25 MG tablet Take 6.25-12.5 mg by mouth every 4 (four) hours as needed for nausea or vomiting.    Yes Historical Camesha Farooq, MD  promethazine (PHENERGAN) 25 MG/ML injection Inject 25 mg into the muscle every 4 (four) hours as needed for nausea or vomiting.   Yes Historical Jakub Debold, MD  ranitidine (ZANTAC) 300 MG tablet Take 300 mg by mouth daily.   Yes Historical Ancelmo Hunt, MD  scopolamine (TRANSDERM-SCOP) 1 MG/3DAYS Place 1 patch onto the skin every 3 (three) days.   Yes Historical Jayvyn Haselton, MD  sucralfate (CARAFATE) 1 GM/10ML suspension Take 10 mLs (1 g total) by mouth 4 (four) times daily -  with meals and at bedtime. 04/03/15  Yes Seeplaputhur Robinette Haines, MD  SYNTHROID 150 MCG tablet Take 1 tablet (150 mcg total) by mouth daily. 01/12/15  Yes Crecencio Mc, MD  Tadalafil, PAH, (ADCIRCA) 20 MG TABS Take 40 mg by mouth daily.   Yes Historical Jasmen Emrich, MD  vitamin C (ASCORBIC ACID) 500 MG tablet Take 1,000 mg by mouth daily.   Yes  Historical Lamarius Dirr, MD  warfarin (COUMADIN) 3 MG tablet Take 3 mg by mouth daily.   Yes Historical Haillie Radu, MD  cholecalciferol (VITAMIN D) 400 UNITS TABS tablet Take 400 Units by mouth daily.    Historical Ector Laurel, MD  cyclobenzaprine (FLEXERIL) 10 MG tablet Take 10 mg by mouth 3 (three) times daily as needed for muscle spasms.    Historical Conor Lata, MD  gabapentin (NEURONTIN) 400 MG capsule Take 400 mg by mouth daily as needed (for nerve pain).    Historical Erroll Wilbourne, MD  ipratropium (ATROVENT) 0.03 % nasal spray Place 2 sprays into both nostrils 3 (three) times daily.    Historical Drevin Ortner, MD  predniSONE (DELTASONE) 1 MG tablet Take 1 mg by mouth daily. Pt takes in addition to the 5mg  tablet.    Historical Selyna Klahn, MD  promethazine (PHENERGAN) 25 MG suppository Place 25 mg rectally every 4 (four) hours as needed for nausea or vomiting.    Historical Darryon Bastin, MD    Current Facility-Administered Medications  Medication Dose Route Frequency TRUE [place] Last Rate Last Dose  . 0.45 % sodium chloride infusion   Intravenous Continuous Fritzi Mandes, MD      . acetaminophen (TYLENOL) tablet 650 mg  650 mg Oral Q6H PRN Lance Coon, MD       Or  . acetaminophen (TYLENOL) suppository 650 mg  650 mg Rectal Q6H PRN Lance Coon, MD      . ALPRAZolam Duanne Moron) tablet 0.25 mg  0.25 mg Oral QHS PRN Lance Coon, MD      . antiseptic oral rinse (CPC / CETYLPYRIDINIUM CHLORIDE 0.05%) solution 7 mL  7 mL Mouth Rinse BID Lance Coon, MD   7 mL at 06/09/15 1000  . ciprofloxacin (CIPRO) tablet 500 mg  500 mg Oral BID Fritzi Mandes, MD      . hydroxychloroquine (PLAQUENIL) tablet 200 mg  200 mg Oral Daily Lance Coon, MD   200 mg at 06/09/15 [id number]  . insulin aspart (novoLOG) injection 0-15 Units  0-15 Units Subcutaneous TID WC Fritzi Mandes, MD   0 Units at 06/09/15 1144  . letrozole [facility]) tablet 2.5 mg  2.5 mg Oral Daily Lance Coon, MD   2.5 mg at 06/09/15 0953  . levothyroxine (SYNTHROID, LEVOTHROID) tablet 150  mcg  150 mcg Oral QAC breakfast Lance Coon, MD   150 mcg at 06/09/15 [phone]  . mometasone-formoterol (DULERA) 100-5 MCG/ACT inhaler 2 puff  2 puff Inhalation BID Lance Coon, MD   2 puff at 06/09/15 [phone]  . morphine 2 MG/ML injection 2 mg  2 mg Intravenous Once Lisa Roca, MD   Stopped at 06/08/15 1919  . morphine 2 MG/ML injection 2 mg  2 mg Intravenous Q4H PRN Lance Coon, MD      . mycophenolate (CELLCEPT) capsule 1,000 mg  1,000 mg Oral BID Fritzi Mandes, MD      . ondansetron [facility]) tablet 4 mg  4 mg Oral Q6H PRN Lance Coon, MD       Or  . ondansetron [facility]) injection 4 mg  4 mg Intravenous Q6H PRN Lance Coon, MD   4 mg at 06/09/15 [id number]  . [START ON 06/12/2015] pantoprazole (PROTONIX) injection 40 mg  40 mg Intravenous Q12H Lance Coon, MD      . predniSONE (DELTASONE) tablet 1 mg  1 mg Oral Daily Lance Coon, MD   1 mg at 06/09/15 0951  . predniSONE (DELTASONE) tablet 5 mg  5 mg Oral Daily Lance Coon, MD   5 mg at 06/09/15 0951  . sodium chloride 0.9 % injection 3 mL  3 mL Intravenous Q12H Lance Coon, MD   3 mL at 06/09/15 0145  . [START ON 06/10/2015] Tadalafil (PAH) TABS 40 mg  40 mg Oral Daily Fritzi Mandes, MD        Allergies as of 06/08/2015 - Review Complete 06/08/2015  Allergen Reaction Noted  . Amoxicillin Other (See Comments)   . Cefuroxime Itching   . Contrast media [iodinated diagnostic agents] Hives   . Cephalexin Rash   . Sulfonamide derivatives Rash   . Tramadol hcl Rash   . Venlafaxine Rash      Review of Systems:    All systems reviewed and negative except where noted in HPI, with the exception of fatigue, anxiety over health, mild memory loss, and weakness. Was treated for breast cancer last year- last chemotherapy 12/15. Was following with Dr Oliva Bustard, however says she has missed some appointments due to her recent illnesses.     Physical Exam:  Vital signs in last 24 hours: Temp:  [97.9 F (36.6 C)-98.3 F (36.8 C)] 98 F (36.7 C) (09/20  1107) Pulse Rate:  [3-108] 94 (09/20 1107) Resp:  [16-18] 18 (09/20 1107) BP: (88-116)/(56-70) 98/56 mmHg (09/20 1107) SpO2:  [92 %-99 %] 97 % (09/20 1107) Weight:  [56.518 kg (124 lb 9.6 oz)] 56.518 kg (124 lb 9.6 oz) (09/20 0145) Last BM Date: 06/07/15 General:   Pleasant elderly woman in NAD Head:  Normocephalic and atraumatic. Eyes:   No icterus.   Conjunctiva pink. Ears:  Normal auditory acuity. Mouth: Mucosa pink moist, no lesions. Neck:  Supple; no masses felt Lungs: Respirations even and unlabored. Lungs clear to auscultation bilaterally.   No wheezes, crackles, or rhonchi.  Heart:  S1S2, RRR, no MRG. No edema. Abdomen:   Gtube intact to luq. Dressing CDI. Some tenderness to luq, minimal tenderness llq. Flat, soft, nondistended. . Normal bowel sounds. No appreciable masses or hepatomegaly. No rebound signs or other peritoneal signs. Rectal:  External hemorrhoid. Nontender. Heme negative. Msk:  MAEW x4, No clubbing or cyanosis. Strength 5/5. Symmetrical without gross deformities. Neurologic:  Alert and  oriented x4;  Cranial nerves II-XII intact.  Skin:  Warm, dry, pink without significant lesions or rashes. Psych:  Alert and cooperative. Normal affect.  LAB RESULTS:  Recent Labs  06/08/15 0425 06/08/15 1425 06/09/15 0500  WBC 7.4 8.2 5.5  HGB 10.5* 11.6* 10.3*  HCT 32.9* 37.4 32.6*  PLT 409 466* 379   BMET  Recent Labs  06/08/15 0425 06/08/15 1425 06/09/15 0500  NA 154* 151* 153*  K 3.6 4.1 3.6  CL 118* 117* 120*  CO2 28 25 24   GLUCOSE 104* 160* 126*  BUN 57* 56* 46*  CREATININE 1.12* 1.30* 1.16*  CALCIUM 8.8* 9.1 8.6*   LFT  Recent Labs  06/08/15 1425  PROT 5.9*  ALBUMIN 2.3*  AST 19  ALT 9*  ALKPHOS 89  BILITOT 0.7   PT/INR  Recent Labs  06/08/15 1425 06/09/15 0500  LABPROT 23.3* 19.4*  INR 2.05 1.62    STUDIES: Ct Abdomen Pelvis W Contrast  06/08/2015   CLINICAL DATA:  Epigastric pain today. Recent admission with evidence of gastric  volvulus. Underwent gastropexy and splenectomy July 2016. Poor oral intake since discharge with difficulty swallowing. History right breast cancer diagnosed November 2015. Patient received emergent steroid prep due to contrast allergy.  EXAM: CT ABDOMEN AND PELVIS WITH CONTRAST  TECHNIQUE: Multidetector CT imaging of the abdomen and pelvis was performed using the standard protocol following bolus administration of intravenous contrast.  CONTRAST:  62mL OMNIPAQUE IOHEXOL 300 MG/ML  SOLN  COMPARISON:  03/26/2015 and 03/11/2015  FINDINGS: Lung bases demonstrate mild bibasilar opacification right worse than left likely atelectasis with slight improvement. Mild stable cardiomegaly. A moderate portion of the stomach persists above the diaphragm unchanged. Small amount contrast is present within the portion of the stomach above the diaphragm.  Abdominal images demonstrate a percutaneous gastrostomy tube in adequate position. Evidence of previous splenectomy. The gallbladder, pancreas and adrenal glands are within normal. Kidneys normal in size without hydronephrosis or nephrolithiasis. 2.7 cm left renal cyst unchanged. Couple subcentimeter bilateral renal cortical hypodensities too small to characterize but likely a cysts and unchanged. The ureter is within normal.  Mild calcified plaque over the abdominal aorta.  The appendix is normal. There is moderate diverticulosis of the colon without active inflammation. Small bowel is within normal. Mesentery is within normal. No significant free fluid or focal inflammatory change.  Pelvic images demonstrate the bladder, or uterus, ovaries and rectum to be within normal.  Examination demonstrates moderate spondylosis of the spine with moderate curvature of the thoracolumbar spine convex right and evidence of 2 previous kyphoplasties of L1 on L2 unchanged. Moderate degenerative change of the hips.  IMPRESSION: No acute findings in the abdomen/ pelvis.  Moderate portion of the  stomach remains above the diaphragm unchanged post gastropexy. Gastrostomy tube in adequate position.  Bibasilar opacification slightly improved likely atelectasis.  Left renal cyst unchanged with bilateral subcentimeter renal cortical hypodensities too small to characterize but likely cysts.  Diverticulosis of the colon without active inflammation.  Other chronic findings and postsurgical changes as described unchanged.   Electronically Signed   By: Marin Olp M.D.   On: 06/08/2015 22:20       Impression / Plan:   1. Abdominal pain. Dyspepsia. Anemia. She is heme negative today. Ct without any acute findings and with stable post operative changes. Agree with Current PPI therapy. May need further endoscopic evaluation. Will discuss further with Dr Gustavo Lah, Further recommendations to folllow.  Thank you very much for this consult. These services were provided by Stephens November, NP-C, in collaboration with Lollie Sails, MD, with whom I have discussed  this patient in full.   Stephens November, NP-C  Addendum: did discuss with Dr Gustavo Lah: plan for egd when clinically feasible. INR will need to be less than 1.3.

## 2015-06-09 NOTE — Progress Notes (Addendum)
[facility] - [place] at [facility]   PATIENT NAME: Diana [name]    MR#:  [id number]  DATE OF BIRTH:  August 13, 1935  SUBJECTIVE:  Denies bright red blood per rectum Epigastric pain Dry mouth  REVIEW OF SYSTEMS:   Review of Systems  Constitutional: Positive for malaise/fatigue. Negative for fever, chills and weight loss.  HENT: Negative for ear discharge, ear pain and nosebleeds.   Eyes: Negative for blurred vision, pain and discharge.  Respiratory: Negative for sputum production, shortness of breath, wheezing and stridor.   Cardiovascular: Negative for chest pain, palpitations, orthopnea and PND.  Gastrointestinal: Positive for nausea and abdominal pain. Negative for vomiting and diarrhea.  Genitourinary: Negative for urgency and frequency.  Musculoskeletal: Negative for back pain and joint pain.  Neurological: Positive for weakness. Negative for sensory change, speech change and focal weakness.  Psychiatric/Behavioral: Negative for depression. The patient is not nervous/anxious.   All other systems reviewed and are negative.  Tolerating Diet:npo  DRUG ALLERGIES:   Allergies  Allergen Reactions  . Amoxicillin Other (See Comments)    Reaction:  Stomach cramps   . Cefuroxime Itching  . Contrast Media [Iodinated Diagnostic Agents] Hives  . Cephalexin Rash  . Sulfonamide Derivatives Rash  . Tramadol Hcl Rash  . Venlafaxine Rash    VITALS:  Blood pressure 98/56, pulse 94, temperature 98 F (36.7 C), temperature source Oral, resp. rate 18, height 5\' 3"  (1.6 m), weight 56.518 kg (124 lb 9.6 oz), SpO2 97 %.  PHYSICAL EXAMINATION:   Physical Exam  GENERAL:  80 y.o.-year-old patient lying in the bed with no acute distress. Thin,cachectic appear ill EYES: Pupils equal, round, reactive to light and accommodation. No scleral icterus. Extraocular muscles intact.  HEENT: Head atraumatic, normocephalic. Oropharynx and nasopharynx clear. Dry oral  mucosa. NECK:  Supple, no jugular venous distention. No thyroid enlargement, no tenderness.  LUNGS: Normal breath sounds bilaterally, no wheezing, rales, rhonchi. No use of accessory muscles of respiration.  CARDIOVASCULAR: S1, S2 normal. No murmurs, rubs, or gallops.  ABDOMEN: Soft, nontender, nondistended. Bowel sounds present. No organomegaly or mass.  EXTREMITIES: No cyanosis, clubbing or edema b/l.    NEUROLOGIC: Cranial nerves II through XII are intact. No focal Motor or sensory deficits b/l.   PSYCHIATRIC: patient is alert and oriented x 3.  SKIN: No obvious rash, lesion, or ulcer.   LABORATORY PANEL:   CBC  Recent Labs Lab 06/09/15 0500  WBC 5.5  HGB 10.3*  HCT 32.6*  PLT 379   Chemistries   Recent Labs Lab 06/08/15 1425 06/09/15 0500  NA 151* 153*  K 4.1 3.6  CL 117* 120*  CO2 25 24  GLUCOSE 160* 126*  BUN 56* 46*  CREATININE 1.30* 1.16*  CALCIUM 9.1 8.6*  AST 19  --   ALT 9*  --   ALKPHOS 89  --   BILITOT 0.7  --     Cardiac Enzymes  Recent Labs Lab 06/08/15 1425  TROPONINI 0.03    RADIOLOGY:  Ct Abdomen Pelvis W Contrast  06/08/2015   CLINICAL DATA:  Epigastric pain today. Recent admission with evidence of gastric volvulus. Underwent gastropexy and splenectomy July 2016. Poor oral intake since discharge with difficulty swallowing. History right breast cancer diagnosed November 2015. Patient received emergent steroid prep due to contrast allergy.  EXAM: CT ABDOMEN AND PELVIS WITH CONTRAST  TECHNIQUE: Multidetector CT imaging of the abdomen and pelvis was performed using the standard protocol following bolus administration of intravenous contrast.  CONTRAST:  75mL OMNIPAQUE IOHEXOL 300 MG/ML  SOLN  COMPARISON:  03/26/2015 and 03/11/2015  FINDINGS: Lung bases demonstrate mild bibasilar opacification right worse than left likely atelectasis with slight improvement. Mild stable cardiomegaly. A moderate portion of the stomach persists above the diaphragm  unchanged. Small amount contrast is present within the portion of the stomach above the diaphragm.  Abdominal images demonstrate a percutaneous gastrostomy tube in adequate position. Evidence of previous splenectomy. The gallbladder, pancreas and adrenal glands are within normal. Kidneys normal in size without hydronephrosis or nephrolithiasis. 2.7 cm left renal cyst unchanged. Couple subcentimeter bilateral renal cortical hypodensities too small to characterize but likely a cysts and unchanged. The ureter is within normal.  Mild calcified plaque over the abdominal aorta.  The appendix is normal. There is moderate diverticulosis of the colon without active inflammation. Small bowel is within normal. Mesentery is within normal. No significant free fluid or focal inflammatory change.  Pelvic images demonstrate the bladder, or uterus, ovaries and rectum to be within normal.  Examination demonstrates moderate spondylosis of the spine with moderate curvature of the thoracolumbar spine convex right and evidence of 2 previous kyphoplasties of L1 on L2 unchanged. Moderate degenerative change of the hips.  IMPRESSION: No acute findings in the abdomen/ pelvis.  Moderate portion of the stomach remains above the diaphragm unchanged post gastropexy. Gastrostomy tube in adequate position.  Bibasilar opacification slightly improved likely atelectasis.  Left renal cyst unchanged with bilateral subcentimeter renal cortical hypodensities too small to characterize but likely cysts.  Diverticulosis of the colon without active inflammation.  Other chronic findings and postsurgical changes as described unchanged.   Electronically Signed   By: Elberta Fortis M.D.   On: 06/08/2015 22:20     ASSESSMENT AND PLAN:  80 y.o. female who presents with abdominal pain, which is chronic and related to multiple recent abdominal surgeries came in with dark green/blackish stools  1. GI bleed appears chronic,slow given gradual drifting of  hgb. -known h/o severe esophagitis (treated in July 2016 with PPI,diflucan and carafate) upper endoscopy in July 2016 showed sever esophagitis. -hgb 7.6--->1 unit BT( 2 weeks ago)--->11.6 (on admision)-->10.3 -Spoke with Dr. Marva Panda -Continue IV PPI twice a day, Carafate. Possible EGD in the morning. Continue H&H -No evidence of bright red blood per rectum.  2. Systemic lupus erythematosus - continue home medications for this-on po prednisone  3.  Diabetes mellitus without complication - sliding scale insulin with appropriate fingerstick glucose checks every 6 hours while nothing by mouth.  4. Hypothyroidism - home dose thyroid replacement  5. GERD - Protonix  as above  6.DVT, recurrent, lower extremity, chronic - on chronic anticoagulation, INR therapeutic on admission given vitamin K in the ED due to GI bleeding. Hold warfarin for now.  Case discussed with Care Management/Social Worker. Management plans discussed with the patient, family and they are in agreement.  CODE STATUS:full  DVT Prophylaxis: scd/teds  TOTAL TIME TAKING CARE OF THIS PATIENT: 35 minutes.  >50% time spent on counselling and coordination of care  POSSIBLE D/C IN 2-3 DAYS, DEPENDING ON CLINICAL CONDITION.   PATEL,SONA M.D on 06/09/2015 at 1:20 PM  Between 7am to 6pm - Pager - [phone]  After 6pm go to [email or web address] - password [email or web address] [facility]  [place]  [facility]  Office  [phone]  CC: Primary care physician; Sherlene Shams, MD

## 2015-06-09 NOTE — Progress Notes (Signed)
No bleeding witnessed during shift.  Will continue to monitor.  Diana [name]

## 2015-06-09 NOTE — H&P (Signed)
[facility] - [place] at [facility]   PATIENT NAME: Diana [name]    MR#:  [id number]  DATE OF BIRTH:  17-Oct-1934  DATE OF ADMISSION:  06/08/2015  PRIMARY CARE PHYSICIAN: Sherlene Shams, MD   REQUESTING/REFERRING PHYSICIAN: Shaune Pollack, M.D.  CHIEF COMPLAINT:   Chief Complaint  Patient presents with  . Abdominal Pain    HISTORY OF PRESENT ILLNESS:  Diana [name]  is a 80 y.o. female who presents with abdominal pain, which is chronic and related to multiple recent abdominal surgeries. More notably, the patient has had recent episodes of rectal bleeding. She states that she had one episode 2 days prior to admission, and a second episode the day prior to admission. She states that she has had issues with anemia recently, and required a blood transfusion about one week prior to admission. On evaluation in the ED patient is hemodynamically stable, hemoglobin is stable 11.6 (patient reports that her hemoglobin came up to 11 after the transfusion 1 week prior to admission), multiple abnormal but stable electrolyte values. Hospitalists were called for admission for rectal bleeding.  PAST MEDICAL HISTORY:   Past Medical History  Diagnosis Date  . Lupus (systemic lupus erythematosus)   . Pulmonary hypertension   . Pneumonia   . Unspecified menopausal and postmenopausal disorder   . Primary pulmonary hypertension   . Acute glomerulonephritis with other specified pathological lesion in kidney in disease classified elsewhere(580.81)   . Unspecified hypothyroidism   . Shingles (herpes zoster) polyneuropathy March 2013    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  . Hiatal hernia   . DVT (deep venous thrombosis)   . Cancer     Breast  . CHF (congestive heart failure)   . Renal insufficiency     PAST SURGICAL HISTORY:   Past Surgical History  Procedure Laterality Date  . Kyphoplasty N/A 02/10/2015    Procedure: [id number];  Surgeon: Kennedy Bucker, MD;  Location: [facility]  ORS;  Service: Orthopedics;  Laterality: N/A;  . Laparotomy N/A 03/11/2015    Procedure: REDUCTION OF GASTRIC VOLVULUS, SPLEENECTOMY, GASTRIC TUBE PLACEMENT;  Surgeon: Earline Mayotte, MD;  Location: [facility] ORS;  Service: General;  Laterality: N/A;  . Esophagogastroduodenoscopy (egd) with propofol N/A 03/28/2015    Procedure: ESOPHAGOGASTRODUODENOSCOPY (EGD) WITH PROPOFOL;  Surgeon: Earline Mayotte, MD;  Location: [facility] ENDOSCOPY;  Service: Endoscopy;  Laterality: N/A;    SOCIAL HISTORY:   Social History  Substance Use Topics  . Smoking status: Former Smoker    Quit date: 05/25/1991  . Smokeless tobacco: Never Used  . Alcohol Use: Yes     Comment: rare    FAMILY HISTORY:   Family History  Problem Relation Age of Onset  . Colon cancer Mother   . Alcohol abuse Sister   . Breast cancer Sister   . Cancer Brother   . Lung cancer Son     DRUG ALLERGIES:   Allergies  Allergen Reactions  . Amoxicillin Other (See Comments)    Reaction:  Stomach cramps   . Cefuroxime Itching  . Contrast Media [Iodinated Diagnostic Agents] Hives  . Cephalexin Rash  . Sulfonamide Derivatives Rash  . Tramadol Hcl Rash  . Venlafaxine Rash    MEDICATIONS AT HOME:   Prior to Admission medications   Medication Sig Start Date End Date Taking? Authorizing Provider  acetaminophen (TYLENOL) 325 MG tablet Take 650 mg by mouth every 4 (four) hours as needed for mild pain or fever.   Yes Historical Provider, MD  ALPRAZolam (  XANAX) 0.25 MG tablet Take 0.25 mg by mouth at bedtime as needed for anxiety.   Yes Historical Provider, MD  Alum & Mag Hydroxide-Simeth (GI COCKTAIL) SUSP suspension Take 30 mLs by mouth every 6 (six) hours as needed (for severe reflux). Also has Lidocaine in it.  Shake well.   Yes Historical Provider, MD  alum hydroxide-mag trisilicate (GAVISCON) 80-20 MG CHEW chewable tablet Chew 2 tablets by mouth 4 (four) times daily as needed for indigestion or heartburn.   Yes Historical Provider,  MD  azelastine (ASTELIN) 0.1 % nasal spray Place 1 spray into both nostrils 2 (two) times daily.   Yes Historical Provider, MD  baclofen (LIORESAL) 10 MG tablet Take 10 mg by mouth 2 (two) times daily.   Yes Historical Provider, MD  cholecalciferol (VITAMIN D) 400 UNITS TABS tablet Take 400 Units by mouth daily.   Yes Historical Provider, MD  cyanocobalamin (,VITAMIN B-12,) 1000 MCG/ML injection Inject 1 mL (1,000 mcg total) into the muscle every 30 (thirty) days. Patient taking differently: Inject 1,000 mcg into the muscle every 30 (thirty) days. Pt uses on the 1st of every month. 11/08/11  Yes Sherlene Shams, MD  cyclobenzaprine (FLEXERIL) 10 MG tablet Take 10 mg by mouth 3 (three) times daily as needed for muscle spasms.   Yes Historical Provider, MD  diclofenac (FLECTOR) 1.3 % PTCH Place 1 patch onto the skin 2 (two) times daily.   Yes Historical Provider, MD  ferrous sulfate 220 (44 FE) MG/5ML solution Take 220 mg by mouth 3 (three) times daily with meals.   Yes Historical Provider, MD  fluticasone (FLONASE) 50 MCG/ACT nasal spray Place 2 sprays into both nostrils daily as needed for rhinitis.   Yes Historical Provider, MD  Fluticasone-Salmeterol (ADVAIR) 250-50 MCG/DOSE AEPB Inhale 1 puff into the lungs 2 (two) times daily.   Yes Historical Provider, MD  furosemide (LASIX) 20 MG tablet Take 20 mg by mouth daily.   Yes Historical Provider, MD  gabapentin (NEURONTIN) 400 MG capsule Take 400 mg by mouth daily as needed (for nerve pain).   Yes Historical Provider, MD  hydroxychloroquine (PLAQUENIL) 200 MG tablet Take 200 mg by mouth daily.   Yes Historical Provider, MD  ipratropium (ATROVENT) 0.03 % nasal spray Place 2 sprays into both nostrils 3 (three) times daily.   Yes Historical Provider, MD  letrozole (FEMARA) 2.5 MG tablet Take 2.5 mg by mouth daily.   Yes Historical Provider, MD  levocetirizine (XYZAL) 5 MG tablet Take 5 mg by mouth daily.   Yes Historical Provider, MD  loperamide (IMODIUM)  2 MG capsule Take 2-4 mg by mouth as needed for diarrhea or loose stools.   Yes Historical Provider, MD  metoCLOPramide (REGLAN) 5 MG tablet Take 5 mg by mouth 4 (four) times daily -  before meals and at bedtime.   Yes Historical Provider, MD  mycophenolate (CELLCEPT) 250 MG capsule Take 1,000 mg by mouth 2 (two) times daily.   Yes Historical Provider, MD  ondansetron (ZOFRAN-ODT) 4 MG disintegrating tablet Take 1 tablet (4 mg total) by mouth every 4 (four) hours as needed for nausea or vomiting. 03/20/15  Yes Earline Mayotte, MD  oxyCODONE-acetaminophen (PERCOCET/ROXICET) 5-325 MG per tablet Take 1 tablet by mouth 3 (three) times daily. Pt is able to be given PRN doses in addition to scheduled doses.   Yes Historical Provider, MD  pantoprazole sodium (PROTONIX) 40 mg/20 mL PACK Place 40 mg into feeding tube 2 (two) times daily.  Yes Historical Provider, MD  potassium chloride 20 MEQ/15ML (10%) SOLN Take 20 mEq by mouth 2 (two) times daily.   Yes Historical Provider, MD  predniSONE (DELTASONE) 1 MG tablet Take 1 mg by mouth daily. Pt takes in addition to the 5mg  tablet.   Yes Historical Provider, MD  predniSONE (DELTASONE) 5 MG tablet Take 5 mg by mouth daily. Pt takes in addition to the 1mg  tablet.   Yes Historical Provider, MD  promethazine (PHENERGAN) 25 MG suppository Place 25 mg rectally every 4 (four) hours as needed for nausea or vomiting.   Yes Historical Provider, MD  promethazine (PHENERGAN) 25 MG tablet Take 6.25-12.5 mg by mouth every 4 (four) hours as needed for nausea or vomiting.    Yes Historical Provider, MD  promethazine (PHENERGAN) 25 MG/ML injection Inject 25 mg into the muscle every 4 (four) hours as needed for nausea or vomiting.   Yes Historical Provider, MD  ranitidine (ZANTAC) 300 MG tablet Take 300 mg by mouth daily.   Yes Historical Provider, MD  scopolamine (TRANSDERM-SCOP) 1 MG/3DAYS Place 1 patch onto the skin every 3 (three) days.   Yes Historical Provider, MD   sucralfate (CARAFATE) 1 GM/10ML suspension Take 10 mLs (1 g total) by mouth 4 (four) times daily -  with meals and at bedtime. 04/03/15  Yes Seeplaputhur Wynona Luna, MD  SYNTHROID 150 MCG tablet Take 1 tablet (150 mcg total) by mouth daily. 01/12/15  Yes Sherlene Shams, MD  Tadalafil, PAH, (ADCIRCA) 20 MG TABS Take 40 mg by mouth daily.   Yes Historical Provider, MD  vitamin C (ASCORBIC ACID) 500 MG tablet Take 1,000 mg by mouth daily.   Yes Historical Provider, MD  warfarin (COUMADIN) 3 MG tablet Take 3 mg by mouth daily.   Yes Historical Provider, MD    REVIEW OF SYSTEMS:  Review of Systems  Constitutional: Negative for fever, chills, weight loss and malaise/fatigue.  HENT: Negative for ear pain, hearing loss and tinnitus.   Eyes: Negative for blurred vision, double vision, pain and redness.  Respiratory: Negative for cough, hemoptysis and shortness of breath.   Cardiovascular: Negative for chest pain, palpitations, orthopnea and leg swelling.  Gastrointestinal: Positive for nausea, vomiting, abdominal pain and blood in stool. Negative for diarrhea and constipation.  Genitourinary: Negative for dysuria, frequency and hematuria.  Musculoskeletal: Negative for back pain, joint pain and neck pain.  Skin:       No acne, rash, or lesions  Neurological: Negative for dizziness, tremors, focal weakness and weakness.  Endo/Heme/Allergies: Negative for polydipsia. Does not bruise/bleed easily.  Psychiatric/Behavioral: Negative for depression. The patient is not nervous/anxious and does not have insomnia.      VITAL SIGNS:   Filed Vitals:   06/08/15 1531 06/08/15 1814 06/08/15 1947 06/08/15 2049  BP: 110/61 107/58 112/70 116/58  Pulse: 106 3 101 100  Temp:      TempSrc:      Resp:  18 16 16   Height:      Weight:      SpO2: 91% 92% 99% 96%   Wt Readings from Last 3 Encounters:  06/08/15 56.246 kg (124 lb)  04/13/15 58.514 kg (129 lb)  04/13/15 58.242 kg (128 lb 6.4 oz)    PHYSICAL  EXAMINATION:  Physical Exam  Vitals reviewed. Constitutional: She is oriented to person, place, and time. She appears well-developed and well-nourished. No distress.  HENT:  Head: Normocephalic and atraumatic.  Mouth/Throat: Oropharynx is clear and moist.  Eyes: Conjunctivae and  EOM are normal. Pupils are equal, round, and reactive to light. No scleral icterus.  Neck: Normal range of motion. Neck supple. No JVD present. No thyromegaly present.  Cardiovascular: Regular rhythm and intact distal pulses.  Exam reveals no gallop and no friction rub.   No murmur heard. Respiratory: Effort normal and breath sounds normal. No respiratory distress. She has no wheezes. She has no rales.  GI: Soft. Bowel sounds are normal. She exhibits no distension. There is tenderness (tenderness is worse in the left upper quadrant around the site of her G-tube insertion).  Musculoskeletal: Normal range of motion. She exhibits no edema.  No arthritis, no gout  Lymphadenopathy:    She has no cervical adenopathy.  Neurological: She is alert and oriented to person, place, and time. No cranial nerve deficit.  No dysarthria, no aphasia  Skin: Skin is warm and dry. No rash noted. No erythema.  Psychiatric: She has a normal mood and affect. Her behavior is normal. Judgment and thought content normal.    LABORATORY PANEL:   CBC  Recent Labs Lab 06/08/15 1425  WBC 8.2  HGB 11.6*  HCT 37.4  PLT 466*   ------------------------------------------------------------------------------------------------------------------  Chemistries   Recent Labs Lab 06/08/15 1425  NA 151*  K 4.1  CL 117*  CO2 25  GLUCOSE 160*  BUN 56*  CREATININE 1.30*  CALCIUM 9.1  AST 19  ALT 9*  ALKPHOS 89  BILITOT 0.7   ------------------------------------------------------------------------------------------------------------------  Cardiac Enzymes  Recent Labs Lab 06/08/15 1425  TROPONINI 0.03    ------------------------------------------------------------------------------------------------------------------  RADIOLOGY:  Ct Abdomen Pelvis W Contrast  06/08/2015   CLINICAL DATA:  Epigastric pain today. Recent admission with evidence of gastric volvulus. Underwent gastropexy and splenectomy July 2016. Poor oral intake since discharge with difficulty swallowing. History right breast cancer diagnosed November 2015. Patient received emergent steroid prep due to contrast allergy.  EXAM: CT ABDOMEN AND PELVIS WITH CONTRAST  TECHNIQUE: Multidetector CT imaging of the abdomen and pelvis was performed using the standard protocol following bolus administration of intravenous contrast.  CONTRAST:  75mL OMNIPAQUE IOHEXOL 300 MG/ML  SOLN  COMPARISON:  03/26/2015 and 03/11/2015  FINDINGS: Lung bases demonstrate mild bibasilar opacification right worse than left likely atelectasis with slight improvement. Mild stable cardiomegaly. A moderate portion of the stomach persists above the diaphragm unchanged. Small amount contrast is present within the portion of the stomach above the diaphragm.  Abdominal images demonstrate a percutaneous gastrostomy tube in adequate position. Evidence of previous splenectomy. The gallbladder, pancreas and adrenal glands are within normal. Kidneys normal in size without hydronephrosis or nephrolithiasis. 2.7 cm left renal cyst unchanged. Couple subcentimeter bilateral renal cortical hypodensities too small to characterize but likely a cysts and unchanged. The ureter is within normal.  Mild calcified plaque over the abdominal aorta.  The appendix is normal. There is moderate diverticulosis of the colon without active inflammation. Small bowel is within normal. Mesentery is within normal. No significant free fluid or focal inflammatory change.  Pelvic images demonstrate the bladder, or uterus, ovaries and rectum to be within normal.  Examination demonstrates moderate spondylosis of the  spine with moderate curvature of the thoracolumbar spine convex right and evidence of 2 previous kyphoplasties of L1 on L2 unchanged. Moderate degenerative change of the hips.  IMPRESSION: No acute findings in the abdomen/ pelvis.  Moderate portion of the stomach remains above the diaphragm unchanged post gastropexy. Gastrostomy tube in adequate position.  Bibasilar opacification slightly improved likely atelectasis.  Left renal cyst unchanged  with bilateral subcentimeter renal cortical hypodensities too small to characterize but likely cysts.  Diverticulosis of the colon without active inflammation.  Other chronic findings and postsurgical changes as described unchanged.   Electronically Signed   By: Elberta Fortis M.D.   On: 06/08/2015 22:20    EKG:   Orders placed or performed during the hospital encounter of 06/08/15  . ED EKG  . ED EKG  . EKG 12-Lead  . EKG 12-Lead  . ED EKG  . ED EKG    IMPRESSION AND PLAN:  Principal Problem:   Rectal bleeding - unclear if upper or lower bleed, GI consult, Protonix drip, nothing by mouth for now. Monitor for any repeat bleeding event. Hemoglobin and hemodynamics stable. Active Problems:   Systemic lupus erythematosus - continue home medications for this   Diabetes mellitus without complication - sliding scale insulin with appropriate fingerstick glucose checks every 6 hours while nothing by mouth.   Hypothyroidism - home dose thyroid replacement   GERD - Protonix drip as above   DVT, recurrent, lower extremity, chronic - on chronic anticoagulation, INR therapeutic on admission given vitamin K in the ED due to rectal bleeding. Hold warfarin for now.  All the records are reviewed and case discussed with ED provider. Management plans discussed with the patient and/or family.  DVT PROPHYLAXIS: Systemic anticoagulation  ADMISSION STATUS: Inpatient  CODE STATUS: Full  TOTAL TIME TAKING CARE OF THIS PATIENT: 50 minutes.    WILLIS, DAVID  FIELDING 06/09/2015, 12:18 AM  [facility]  Office  [phone]  CC: Primary care physician; Sherlene Shams, MD

## 2015-06-09 NOTE — Progress Notes (Signed)
Pt does not want to take hospitals cellcept and tadalfil. Pt reports her son is bringing her bottles from home. Pt aware that out pharmacy must process them first before she can take them. Pt is agreeable to this.

## 2015-06-09 NOTE — Progress Notes (Signed)
There is a drug interaction between cipro and cellcept. Potential decreased plasma concentrations of cellept. Patient is growing gram negative rods in her peg tube. Due to patient allergies to amoxicillin, cephalosporins, and septra, there are no other treatment options. Treatment with antibiotics will only be about 4-5 days. Spoke with Dr Posey Pronto and we are in agreement to go ahead with ciprofloxacin treatment.  Ramond Dial, Pharm.D Clinical Pharmacist

## 2015-06-09 NOTE — Progress Notes (Signed)
Pt questioning if she will get tube feeding tonight, MD, Dr. Jannifer Franklin notified.  MD wants to wait for GI consult tomorrow due to the fact the patient is having such abdominal pain/discomfort. MD approved her medications being given through tube as long as it is flushing well.  Will continue to monitor. Jessee Avers

## 2015-06-09 NOTE — Clinical Social Work Note (Signed)
Clinical Social Work Assessment  Patient Details  Name: Diana [name] MRN: [id number] Date of Birth: 20-Apr-1935  Date of referral:  06/09/15               Reason for consult:  Facility Placement (pt was initially from [facility])                Permission sought to share information with:  Facility Sport and exercise psychologist, Family Supports Permission granted to share information::  Yes, Verbal Permission Granted  Name::     Linna Hoff [phone] son  Agency::  [facility]  Relationship::     Contact Information:     Housing/Transportation Living arrangements for the past 2 months:  [place], [place] of Information:  Patient, Facility, Medical Team Patient Interpreter Needed:  None Criminal Activity/Legal Involvement Pertinent to Current Situation/Hospitalization:  No - Comment as needed Significant Relationships:  Adult Children Lives with:  Adult Children Do you feel safe going back to the place where you live?  No Need for family participation in patient care:  Yes (Comment)  Care giving concerns:  CSW spoke to pt.  Her current concerns are that she has used all of her SNF days. CSW has spoken to [facility].  Per facility pt can come back. CSW is in the process of verifying SNF days.   Social Worker assessment / plan:  CSW spoke to pt.  She was pleasant and fully oriented.  Pt verified that she lives with her son, but is currently in SNF for STR.  She is in agreement with returning to SNF to complete her rehab.  Per pt she normally used a wheel chair with wheels on it for walking while she was at rehab.  CSW will continue to follow.  Employment status:  Retired Forensic scientist:  Medicare PT Recommendations:    Information / Referral to community resources:     Patient/Family's Response to care:  Pt was in agreement with DC back to [employer].  Patient/Family's Understanding of and Emotional Response to Diagnosis, Current  Treatment, and Prognosis:  Pt thanked CSW for speaking with her about SNF placement, and verbalized her understanding of DC to SNF.    Emotional Assessment Appearance:  Appears stated age Attitude/Demeanor/Rapport:   (pleasent) Affect (typically observed):  Appropriate Orientation:  Oriented to Self, Oriented to Place, Oriented to  Time, Oriented to Situation Alcohol / Substance use:  Never Used Psych involvement (Current and /or in the community):  No (Comment)  Discharge Needs  Concerns to be addressed:  Care Coordination Readmission within the last 30 days:  No Current discharge risk:  Physical Impairment Barriers to Discharge:  No Barriers Identified   Mathews Argyle, LCSW 06/09/2015, 3:20 PM

## 2015-06-09 NOTE — Progress Notes (Signed)
MD paged and notified of Gtube wound culture results showing gram negative rods. MD gave RN verbal orders for abx.

## 2015-06-09 NOTE — Progress Notes (Signed)
Initial Nutrition Assessment   INTERVENTION:   Coordination of Care: await GI consult and follow poc regarding initiation of enteral nutrition and diet progression.   NUTRITION DIAGNOSIS:   Inadequate oral intake related to altered GI function as evidenced by per patient/family report.  GOAL:   Patient will meet greater than or equal to 90% of their needs  MONITOR:    (Energy Intake, Anthropometrics, Digestive system, Electrolyte and Renal Profile)  REASON FOR ASSESSMENT:   Malnutrition Screening Tool    ASSESSMENT:   Pt admitted with rectal bleed. GI consulted per MD note. Pt s/p Gastrostomy tube placement one month ago secondary to FTT and weight loss. Per MD note, possible EGD tomorrow morning.  Past Medical History  Diagnosis Date  . Lupus (systemic lupus erythematosus)   . Pulmonary hypertension   . Pneumonia   . Unspecified menopausal and postmenopausal disorder   . Primary pulmonary hypertension   . Acute glomerulonephritis with other specified pathological lesion in kidney in disease classified elsewhere(580.81)   . Unspecified hypothyroidism   . Shingles (herpes zoster) polyneuropathy March 2013    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  . Hiatal hernia   . DVT (deep venous thrombosis)   . Cancer     Breast  . CHF (congestive heart failure)   . Renal insufficiency    Past Surgical History  Procedure Laterality Date  . Kyphoplasty N/A 02/10/2015    Procedure: [id number];  Surgeon: Hessie Knows, MD;  Location: [facility] ORS;  Service: Orthopedics;  Laterality: N/A;  . Laparotomy N/A 03/11/2015    Procedure: REDUCTION OF GASTRIC VOLVULUS, SPLEENECTOMY, GASTRIC TUBE PLACEMENT;  Surgeon: Robert Bellow, MD;  Location: [facility] ORS;  Service: General;  Laterality: N/A;  . Esophagogastroduodenoscopy (egd) with propofol N/A 03/28/2015    Procedure: ESOPHAGOGASTRODUODENOSCOPY (EGD) WITH PROPOFOL;  Surgeon: Robert Bellow, MD;  Location: [facility] ENDOSCOPY;  Service:  Endoscopy;  Laterality: N/A;    Diet Order:  Diet clear liquid Room service appropriate?: Yes; Fluid consistency:: Thin    Current Nutrition: Pt reports eating half of popscile this am, nothing since. Pt sipping on [employer] in room.   Food/Nutrition-Related History: Pt reports eating 2 bites of eggs, 2 bites of lunch and dinner during the day. Pt reports getting night time feedings usually lasting from 9pm to 6am every night. RN Gregary Signs reports formula Nutren 1.5 at 14mL for 9 hours (513kcals, 23g protein, 219mL of free water). 132mL BID water flushes every 12 hours. RN Gregary Signs reports pt ate better from community foods brought in.  Medications: 0.45%NS at 43mL/hr, Prednisone, Cipro, Novolog  Electrolyte/Renal Profile and Glucose Profile:   Recent Labs Lab 06/08/15 0425 06/08/15 1425 06/09/15 0500  NA 154* 151* 153*  K 3.6 4.1 3.6  CL 118* 117* 120*  CO2 28 25 24   BUN 57* 56* 46*  CREATININE 1.12* 1.30* 1.16*  CALCIUM 8.8* 9.1 8.6*  GLUCOSE 104* 160* 126*   Protein Profile:  Recent Labs Lab 06/05/15 1050 06/08/15 0425 06/08/15 1425  ALBUMIN 2.2* 1.9* 2.3*   Nutritional Anemia Profile:  CBC Latest Ref Rng 06/09/2015 06/08/2015 06/08/2015  WBC 3.6 - 11.0 K/uL 5.5 8.2 7.4  Hemoglobin 12.0 - 16.0 g/dL 10.3(L) 11.6(L) 10.5(L)  Hematocrit 35.0 - 47.0 % 32.6(L) 37.4 32.9(L)  Platelets 150 - 440 K/uL 379 466(H) 409    Gastrointestinal Profile: Gastrostomy tube in place LLQ, per Nsg documentation pt c/o heartburn Last BM:  08/10/2015   Nutrition-Focused Physical Exam Findings: Nutrition-Focused physical exam completed. Findings are mild  fat depletion of triceps, no muscle depletion however RD unable to fully assess lower extremities, and no edema.    Weight Change: Pt reports even with TF initiation weight has still been dropping.  Per [facility], pt with 19% weight loss in 4 months. [place] RN Gregary Signs reports pt weight of 123.8lbs on 04/23/2015 and weight fluctuated daily for  the past month; yesterday was 124lbs.  Height:   Ht Readings from Last 1 Encounters:  06/08/15 5\' 3"  (1.6 m)    Weight:    Wt Readings from Last 1 Encounters:  06/09/15 124 lb 9.6 oz (56.518 kg)    Wt Readings from Last 10 Encounters:  06/09/15 124 lb 9.6 oz (56.518 kg)  04/13/15 129 lb (58.514 kg)  04/13/15 128 lb 6.4 oz (58.242 kg)  04/06/15 134 lb (60.782 kg)  04/03/15 158 lb 12.8 oz (72.031 kg)  03/24/15 138 lb (62.596 kg)  03/20/15 141 lb 1.6 oz (64.003 kg)  02/10/15 154 lb (69.854 kg)  02/05/15 154 lb (69.854 kg)  12/17/14 154 lb 1.9 oz (69.908 kg)    BMI:  Body mass index is 22.08 kg/(m^2).  Estimated Nutritional Needs:   Kcal:  BEE: 1014kcals, TEE: (IF 1.2-1.4)(AF 1.2) 1460-1703kcals  Protein:  57-68g protein (1.0-1.2g/kg)   Fluid:  1413-1692mL of fluid (25-17mL/kg)   EDUCATION NEEDS:   No education needs identified at this time    [place], RD, LDN Pager [phone]

## 2015-06-10 ENCOUNTER — Inpatient Hospital Stay: Payer: Medicare Other | Admitting: Anesthesiology

## 2015-06-10 ENCOUNTER — Encounter: Payer: Self-pay | Admitting: Anesthesiology

## 2015-06-10 ENCOUNTER — Encounter
Admission: EM | Disposition: A | Discharge status: Skilled Nursing Facility | Payer: Self-pay | Source: Home / Self Care | Attending: Internal Medicine

## 2015-06-10 HISTORY — PX: ESOPHAGOGASTRODUODENOSCOPY (EGD) WITH PROPOFOL: SHX5813

## 2015-06-10 LAB — BASIC METABOLIC PANEL
Anion gap: 9 (ref 5–15)
BUN: 30 mg/dL — AB (ref 6–20)
CHLORIDE: 120 mmol/L — AB (ref 101–111)
CO2: 24 mmol/L (ref 22–32)
CREATININE: 1.18 mg/dL — AB (ref 0.44–1.00)
Calcium: 8.8 mg/dL — ABNORMAL LOW (ref 8.9–10.3)
GFR calc non Af Amer: 43 mL/min — ABNORMAL LOW (ref >60)
GFR, EST AFRICAN AMERICAN: 50 mL/min — AB (ref >60)
Glucose, Bld: 88 mg/dL (ref 65–99)
POTASSIUM: 3.2 mmol/L — AB (ref 3.5–5.1)
Sodium: 153 mmol/L — ABNORMAL HIGH (ref 135–145)

## 2015-06-10 LAB — GLUCOSE, CAPILLARY
GLUCOSE-CAPILLARY: 85 mg/dL (ref 65–99)
GLUCOSE-CAPILLARY: 79 mg/dL (ref 65–99)
Glucose-Capillary: 84 mg/dL (ref 65–99)
Glucose-Capillary: 75 mg/dL (ref 65–99)

## 2015-06-10 LAB — MAGNESIUM: MAGNESIUM: 1.9 mg/dL (ref 1.7–2.4)

## 2015-06-10 LAB — PROTIME-INR
INR: 1.22
PROTHROMBIN TIME: 15.6 s — AB (ref 11.4–15.0)

## 2015-06-10 LAB — HEMOGLOBIN: Hemoglobin: 10.5 g/dL — ABNORMAL LOW (ref 12.0–16.0)

## 2015-06-10 SURGERY — ESOPHAGOGASTRODUODENOSCOPY (EGD) WITH PROPOFOL
Anesthesia: General

## 2015-06-10 MED ORDER — LIDOCAINE HCL (PF) 2 % IJ SOLN
INTRAMUSCULAR | Status: DC | PRN
  Administered 2015-06-10: 60 mg via INTRADERMAL

## 2015-06-10 MED ORDER — POTASSIUM CHLORIDE CRYS ER 20 MEQ PO TBCR: 40.0000 meq | EXTENDED_RELEASE_TABLET | Freq: Once | ORAL | Status: DC

## 2015-06-10 MED ORDER — SALINE SPRAY 0.65 % NA SOLN
1.0000 | NASAL | Status: DC | PRN
  Administered 2015-06-10: 1 via NASAL
  Filled 2015-06-10: qty 44

## 2015-06-10 MED ORDER — FREE WATER
150.0000 mL | Freq: Two times a day (BID) | Status: DC
  Administered 2015-06-10 – 2015-06-11 (×2): 150 mL

## 2015-06-10 MED ORDER — POTASSIUM CHLORIDE 20 MEQ PO PACK
40.0000 meq | PACK | Freq: Once | ORAL | Status: AC
  Administered 2015-06-10: 40 meq
  Filled 2015-06-10: qty 2

## 2015-06-10 MED ORDER — PROPOFOL 10 MG/ML IV BOLUS
INTRAVENOUS | Status: DC | PRN
  Administered 2015-06-10: 10 mg via INTRAVENOUS
  Administered 2015-06-10: 50 mg via INTRAVENOUS

## 2015-06-10 MED ORDER — EPHEDRINE SULFATE 50 MG/ML IJ SOLN
INTRAMUSCULAR | Status: DC | PRN
  Administered 2015-06-10 (×4): 5 mg via INTRAVENOUS

## 2015-06-10 MED ORDER — PROPOFOL INFUSION 10 MG/ML OPTIME
INTRAVENOUS | Status: DC | PRN
  Administered 2015-06-10: 100 ug/kg/min via INTRAVENOUS

## 2015-06-10 MED ORDER — SODIUM CHLORIDE 0.9 % IV SOLN
INTRAVENOUS | Status: DC | PRN
  Administered 2015-06-10: 14:00:00 via INTRAVENOUS

## 2015-06-10 MED ORDER — JEVITY 1.5 CAL/FIBER PO LIQD
1000.0000 mL | ORAL | Status: DC
  Administered 2015-06-10: 22:00:00 1000 mL

## 2015-06-10 MED ORDER — FREE WATER
25.0000 mL | Status: DC
  Administered 2015-06-10 – 2015-06-11 (×2): 25 mL

## 2015-06-10 NOTE — Plan of Care (Signed)
Problem: Discharge Progression Outcomes Goal: Other Discharge Outcomes/Goals Outcome: Progressing Plan of care progress to goal From [facility]. Tx to this unit last night. Some anxiety. Improved with prn Xanax. No c/o pain. Up with assist to BR. Uses a front wheeled walker. IVF infusing. 02 used chronically at night.

## 2015-06-10 NOTE — Progress Notes (Signed)
MEDICATION RELATED CONSULT NOTE - INITIAL   Pharmacy Consult for electrolyte management Indication: hypokalemia  Allergies  Allergen Reactions  . Amoxicillin Other (See Comments)    Reaction:  Stomach cramps   . Cefuroxime Itching  . Contrast Media [Iodinated Diagnostic Agents] Hives  . Cephalexin Rash  . Sulfonamide Derivatives Rash  . Tramadol Hcl Rash  . Venlafaxine Rash    Patient Measurements: Height: 5\' 3"  (160 cm) Weight: 124 lb 9.6 oz (56.518 kg) IBW/kg (Calculated) : 52.4  Vital Signs: Temp: 98 F (36.7 C) (09/21 0609) Temp Source: Oral (09/21 0609) BP: 114/51 mmHg (09/21 0609) Pulse Rate: 95 (09/21 0627) Intake/Output from previous day: 09/20 0701 - 09/21 0700 In: 1605.4 [I.V.:1605.4] Out: 700 [Urine:700]  Labs:  Recent Labs  06/08/15 0425 06/08/15 1425 06/09/15 0500 06/10/15 0619  WBC 7.4 8.2 5.5  --   HGB 10.5* 11.6* 10.3* 10.5*  HCT 32.9* 37.4 32.6*  --   PLT 409 466* 379  --   CREATININE 1.12* 1.30* 1.16* 1.18*  ALBUMIN 1.9* 2.3*  --   --   PROT 5.1* 5.9*  --   --   AST 14* 19  --   --   ALT 8* 9*  --   --   ALKPHOS 77 89  --   --   BILITOT 0.4 0.7  --   --    Estimated Creatinine Clearance: 32.5 mL/min (by C-G formula based on Cr of 1.18).  Microbiology: Recent Results (from the past 720 hour(s))  Urine culture     Status: None   Collection Time: 05/29/15  5:30 PM  Result Value Ref Range Status   Specimen Description URINE, RANDOM  Final   Special Requests NONE  Final   Culture INSIGNIFICANT GROWTH  Final   Report Status 05/31/2015 FINAL  Final  Urine culture     Status: None   Collection Time: 06/01/15  9:10 AM  Result Value Ref Range Status   Specimen Description URINE, RANDOM  Final   Special Requests NONE  Final   Culture MULTIPLE SPECIES PRESENT, SUGGEST RECOLLECTION  Final   Report Status 06/03/2015 FINAL  Final  Wound culture     Status: None (Preliminary result)   Collection Time: 06/08/15 11:30 AM  Result Value Ref Range  Status   Specimen Description PEG SITE  Final   Special Requests NONE  Final   Gram Stain   Final    RARE WBC SEEN RARE GRAM POSITIVE COCCI FEW GRAM NEGATIVE RODS    Culture   Final    HEAVY GROWTH GRAM NEGATIVE RODS MODERATE GROWTH GRAM NEGATIVE RODS IDENTIFICATION AND SUSCEPTIBILITIES TO FOLLOW    Report Status PENDING  Incomplete  MRSA PCR Screening     Status: None   Collection Time: 06/09/15  2:27 AM  Result Value Ref Range Status   MRSA by PCR NEGATIVE NEGATIVE Final    Comment:        The GeneXpert MRSA Assay (FDA approved for NASAL specimens only), is one component of a comprehensive MRSA colonization surveillance program. It is not intended to diagnose MRSA infection nor to guide or monitor treatment for MRSA infections.     Medical History: Past Medical History  Diagnosis Date  . Lupus (systemic lupus erythematosus)   . Pulmonary hypertension   . Pneumonia   . Unspecified menopausal and postmenopausal disorder   . Primary pulmonary hypertension   . Acute glomerulonephritis with other specified pathological lesion in kidney in disease classified elsewhere(580.81)   .  Unspecified hypothyroidism   . Shingles (herpes zoster) polyneuropathy March 2013    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  . Hiatal hernia   . DVT (deep venous thrombosis)   . Cancer     Breast  . CHF (congestive heart failure)   . Renal insufficiency     Medications:  Scheduled:  . antiseptic oral rinse  7 mL Mouth Rinse BID  . ciprofloxacin  500 mg Oral BID  . diclofenac  1 patch Transdermal BID  . hydroxychloroquine  200 mg Oral Daily  . insulin aspart  0-15 Units Subcutaneous TID WC  . letrozole  2.5 mg Oral Daily  . levothyroxine  150 mcg Oral QAC breakfast  . mometasone-formoterol  2 puff Inhalation BID  .  morphine injection  2 mg Intravenous Once  . mycophenolate  1,000 mg Oral BID  . [START ON 06/12/2015] pantoprazole (PROTONIX) IV  40 mg Intravenous Q12H  . potassium chloride   40 mEq Oral Once  . predniSONE  1 mg Oral Daily  . predniSONE  5 mg Oral Daily  . sodium chloride  3 mL Intravenous Q12H  . Tadalafil (PAH)  40 mg Oral Daily   Infusions:  . sodium chloride 75 mL/hr at 06/10/15 [id number]    Assessment: PD is a 80 yo female admitted for epigastric pain. Pharmacy is consulted to manage electrolytes in this patient.  Plan:  9/21 K+ 3.2, Mag added on to AM labs  Initiate KCl 63mEq PO x1 and recheck K in AM. Will adjust Mag accordingly.  Pharmacy will continue to monitor.  Barnabas Harries Tharakan 06/10/2015,10:11 AM

## 2015-06-10 NOTE — Progress Notes (Signed)
Nutrition Follow-up   INTERVENTION:   EN: Per MD, Posey Pronto request will initiate nighttime TF tonight after EGD performed with GI MD approval. Will recommend Jevity 1.5 at 59mL/hr with free water flushes of 4mL/hr at 21:00 tonight to run for 12 hours. This provides 513kcals and 22 g protein with 611mL of free water. Recommend a new goal rate of 44mL/hr for 12 hours feeds to provide 1260kcals (87% of estimated caloric needs) and 54g protein (95% of protein needs). RD aware that pt has been working with RD at [facility] as pt c/o fullness and feeds have cut off at 9 hours and rate lowered. Pt is able to take po diet, if able to tolerate. Will recommend increase in free water flushes or to add free water flushes daily if sodium remains high and/or IVF discontinued. Will continue to follow and make recommendations accordingly. Meals and Snacks: Cater to patient preferences, per MD Posey Pronto request, advance diet order to Full Liquids after EGD. Spoke with RN Daleen Snook as pt just left for EGD. Will advance once approval from GI physician s/p EGD.   NUTRITION DIAGNOSIS:   Inadequate oral intake related to altered GI function as evidenced by per patient/family report.  GOAL:   Patient will meet greater than or equal to 90% of their needs; ongoing  MONITOR:    (Energy Intake, Anthropometrics, Digestive system, Electrolyte and Renal Profile)  REASON FOR ASSESSMENT:   Consult Enteral/tube feeding initiation and management  ASSESSMENT:   Pt admitted with rectal bleed. GI consulted per MD note. Pt s/p Gastrostomy tube placement one month ago secondary to FTT and weight loss. EGD scheduled for this afternoon, GI following.  Diet Order:  Diet clear liquid Room service appropriate?: Yes; Fluid consistency:: Thin    Current Nutrition: Pt continues on CL diet order, does not like broth any flavor.   Gastrointestinal Profile: Last BM: 07/26/2015   Medications: Protonix, Novolog, KCl,  Prednisone, 0.45NS at 88mL/hr  Electrolyte/Renal Profile and Glucose Profile:   Recent Labs Lab 06/08/15 1425 06/09/15 0500 06/10/15 0619  NA 151* 153* 153*  K 4.1 3.6 3.2*  CL 117* 120* 120*  CO2 25 24 24   BUN 56* 46* 30*  CREATININE 1.30* 1.16* 1.18*  CALCIUM 9.1 8.6* 8.8*  MG  --   --  1.9  GLUCOSE 160* 126* 88   Protein Profile:  Recent Labs Lab 06/05/15 1050 06/08/15 0425 06/08/15 1425  ALBUMIN 2.2* 1.9* 2.3*     Weight Trend since Admission: Filed Weights   06/08/15 1405 06/09/15 0145  Weight: 124 lb (56.246 kg) 124 lb 9.6 oz (56.518 kg)     BMI:  Body mass index is 22.08 kg/(m^2).  Estimated Nutritional Needs:   Kcal:  BEE: 1014kcals, TEE: (IF 1.2-1.4)(AF 1.2) 1460-1703kcals  Protein:  57-68g protein (1.0-1.2g/kg)   Fluid:  1413-1647mL of fluid (25-62mL/kg)   EDUCATION NEEDS:   No education needs identified at this time    [place], RD, LDN Pager [phone]

## 2015-06-10 NOTE — Plan of Care (Signed)
Problem: Discharge Progression Outcomes Goal: Other Discharge Outcomes/Goals Outcome: Progressing Plan of care progress to goal for: 1. Discharge Plan:         In Place and Appropriate.         Clinical Education officer, museum (CSW) will facilitate discharge to [place] when stable. 2. Pain:         Denies pain. 3. Hemodynamically Stable:         VSS.         Afebrile.         Zofran 4mg  IV given for Nausea.         Remains on IVF's.         Remains on po Antibiotics.         Daily Lab Draws.          4. Complications:         No signs/symptoms of complications noted. 5. Diet:         NPO except Ice Chips and Sips with Meds.         Tolerated 139ml Free H2O via Peg Tube.          Resume TF at 2100.  6. Activity:         Standby Assist with [employer].

## 2015-06-10 NOTE — Progress Notes (Signed)
[facility] [employer] - [place] at [facility]   PATIENT NAME: Diana [name]    MR#:  [id number]  DATE OF BIRTH:  1935/04/03  SUBJECTIVE:  C/o dry stuffed up nose Dry mouth  REVIEW OF SYSTEMS:   Review of Systems  Constitutional: Positive for malaise/fatigue. Negative for fever, chills and weight loss.  HENT: Negative for ear discharge, ear pain and nosebleeds.   Eyes: Negative for blurred vision, pain and discharge.  Respiratory: Negative for sputum production, shortness of breath, wheezing and stridor.   Cardiovascular: Negative for chest pain, palpitations, orthopnea and PND.  Gastrointestinal: Positive for nausea and abdominal pain. Negative for vomiting and diarrhea.  Genitourinary: Negative for urgency and frequency.  Musculoskeletal: Negative for back pain and joint pain.  Neurological: Positive for weakness. Negative for sensory change, speech change and focal weakness.  Psychiatric/Behavioral: Negative for depression. The patient is not nervous/anxious.   All other systems reviewed and are negative.  Tolerating Diet:npo  DRUG ALLERGIES:   Allergies  Allergen Reactions  . Amoxicillin Other (See Comments)    Reaction:  Stomach cramps   . Cefuroxime Itching  . Contrast Media [Iodinated Diagnostic Agents] Hives  . Cephalexin Rash  . Sulfonamide Derivatives Rash  . Tramadol Hcl Rash  . Venlafaxine Rash    VITALS:  Blood pressure 114/81, pulse 61, temperature 97.9 F (36.6 C), temperature source Oral, resp. rate 18, height 5\' 3"  (1.6 m), weight 56.518 kg (124 lb 9.6 oz), SpO2 94 %.  PHYSICAL EXAMINATION:   Physical Exam  GENERAL:  80 y.o.-year-old patient lying in the bed with no acute distress. Thin,cachectic appear ill EYES: Pupils equal, round, reactive to light and accommodation. No scleral icterus. Extraocular muscles intact.  HEENT: Head atraumatic, normocephalic. Oropharynx and nasopharynx clear. Dry oral mucosa. NECK:  Supple, no jugular  venous distention. No thyroid enlargement, no tenderness.  LUNGS: Normal breath sounds bilaterally, no wheezing, rales, rhonchi. No use of accessory muscles of respiration.  CARDIOVASCULAR: S1, S2 normal. No murmurs, rubs, or gallops.  ABDOMEN: Soft, nontender, nondistended. Bowel sounds present. No organomegaly or mass.  EXTREMITIES: No cyanosis, clubbing or edema b/l.    NEUROLOGIC: Cranial nerves II through XII are intact. No focal Motor or sensory deficits b/l.   PSYCHIATRIC: patient is alert and oriented x 3.  SKIN: No obvious rash, lesion, or ulcer.   LABORATORY PANEL:   CBC  Recent Labs Lab 06/09/15 0500 06/10/15 0619  WBC 5.5  --   HGB 10.3* 10.5*  HCT 32.6*  --   PLT 379  --    Chemistries   Recent Labs Lab 06/08/15 1425  06/10/15 0619  NA 151*  < > 153*  K 4.1  < > 3.2*  CL 117*  < > 120*  CO2 25  < > 24  GLUCOSE 160*  < > 88  BUN 56*  < > 30*  CREATININE 1.30*  < > 1.18*  CALCIUM 9.1  < > 8.8*  MG  --   --  1.9  AST 19  --   --   ALT 9*  --   --   ALKPHOS 89  --   --   BILITOT 0.7  --   --   < > = values in this interval not displayed.  Cardiac Enzymes  Recent Labs Lab 06/08/15 1425  TROPONINI 0.03    RADIOLOGY:  Ct Abdomen Pelvis W Contrast  06/08/2015   CLINICAL DATA:  Epigastric pain today. Recent admission with evidence of  gastric volvulus. Underwent gastropexy and splenectomy July 2016. Poor oral intake since discharge with difficulty swallowing. History right breast cancer diagnosed November 2015. Patient received emergent steroid prep due to contrast allergy.  EXAM: CT ABDOMEN AND PELVIS WITH CONTRAST  TECHNIQUE: Multidetector CT imaging of the abdomen and pelvis was performed using the standard protocol following bolus administration of intravenous contrast.  CONTRAST:  75mL OMNIPAQUE IOHEXOL 300 MG/ML  SOLN  COMPARISON:  03/26/2015 and 03/11/2015  FINDINGS: Lung bases demonstrate mild bibasilar opacification right worse than left likely  atelectasis with slight improvement. Mild stable cardiomegaly. A moderate portion of the stomach persists above the diaphragm unchanged. Small amount contrast is present within the portion of the stomach above the diaphragm.  Abdominal images demonstrate a percutaneous gastrostomy tube in adequate position. Evidence of previous splenectomy. The gallbladder, pancreas and adrenal glands are within normal. Kidneys normal in size without hydronephrosis or nephrolithiasis. 2.7 cm left renal cyst unchanged. Couple subcentimeter bilateral renal cortical hypodensities too small to characterize but likely a cysts and unchanged. The ureter is within normal.  Mild calcified plaque over the abdominal aorta.  The appendix is normal. There is moderate diverticulosis of the colon without active inflammation. Small bowel is within normal. Mesentery is within normal. No significant free fluid or focal inflammatory change.  Pelvic images demonstrate the bladder, or uterus, ovaries and rectum to be within normal.  Examination demonstrates moderate spondylosis of the spine with moderate curvature of the thoracolumbar spine convex right and evidence of 2 previous kyphoplasties of L1 on L2 unchanged. Moderate degenerative change of the hips.  IMPRESSION: No acute findings in the abdomen/ pelvis.  Moderate portion of the stomach remains above the diaphragm unchanged post gastropexy. Gastrostomy tube in adequate position.  Bibasilar opacification slightly improved likely atelectasis.  Left renal cyst unchanged with bilateral subcentimeter renal cortical hypodensities too small to characterize but likely cysts.  Diverticulosis of the colon without active inflammation.  Other chronic findings and postsurgical changes as described unchanged.   Electronically Signed   By: Elberta Fortis M.D.   On: 06/08/2015 22:20     ASSESSMENT AND PLAN:  80 y.o. female who presents with abdominal pain, which is chronic and related to multiple recent  abdominal surgeries came in with dark green/blackish stools  1. GI bleed appears chronic,slow given gradual drifting of hgb. -known h/o severe esophagitis (treated in July 2016 with PPI,diflucan and carafate) upper endoscopy in July 2016 showed sever esophagitis. -hgb 7.6--->1 unit BT( 2 weeks ago)--->11.6 (on admision)-->10.3 -Spoke with Dr. Marva Panda -Continue IV PPI twice a day, Carafate. Possible EGD today -Continue H&H -No evidence of bright red blood per rectum.  2. Systemic lupus erythematosus - continue home medications for this-on po prednisone  3.  Diabetes mellitus without complication - sliding scale insulin with appropriate fingerstick glucose checks every 6 hours while nothing by mouth.  4. Hypothyroidism - home dose thyroid replacement  5. GERD - Protonix  as above  6.DVT, recurrent, lower extremity, chronic - on chronic anticoagulation, INR therapeutic on admission given vitamin K in the ED due to GI bleeding. Hold warfarin for now.  Case discussed with Care Management/Social Worker. Management plans discussed with the patient, family and they are in agreement.  CODE STATUS:full  DVT Prophylaxis: scd/teds  TOTAL TIME TAKING CARE OF THIS PATIENT: 35 minutes.  >50% time spent on counselling and coordination of care  POSSIBLE D/C IN 2-3 DAYS, DEPENDING ON CLINICAL CONDITION.   PATEL,SONA M.D on 06/10/2015 at 2:14 PM  Between 7am to 6pm - Pager - [phone]  After 6pm go to [email or web address] - password [email or web address] [facility]  [facility] [facility]  Office  [phone]  CC: Primary care physician; Sherlene Shams, MD

## 2015-06-10 NOTE — Op Note (Signed)
[facility]astroenterology Patient Name: Diana [name] Procedure Date: 06/10/2015 2:23 PM MRN: [id number] Account #: [id number] Date of Birth: 1935/04/28 Admit Type: Inpatient Age: 80 Room: [facility] ENDO ROOM 2 Gender: Female Note Status: Finalized Procedure:         Upper GI endoscopy Indications:       Gastro-esophageal reflux disease, Heme positive stool Providers:         Christena Deem, MD Referring MD:      Duncan Dull, MD (Referring MD) Medicines:         Monitored Anesthesia Care Complications:     No immediate complications. Procedure:         Pre-Anesthesia Assessment:                    - ASA Grade Assessment: III - A patient with severe                     systemic disease.                    After obtaining informed consent, the endoscope was passed                     under direct vision. Throughout the procedure, the                     patient's blood pressure, pulse, and oxygen saturations                     were monitored continuously. The Endoscope was introduced                     through the mouth, and advanced to the third part of                     duodenum. The upper GI endoscopy was accomplished without                     difficulty. The patient tolerated the procedure well. Findings:      LA Grade C to D (one or more mucosal breaks continuous between tops of 2       or more mucosal folds, less than 75% circumference) esophagitis with       with contact friability was found. There was food debris in the       esophagus that had to by cleared by suction and displacement into the       stomach by the scope.      A medium-sized hiatus hernia was present. There is a pouch off to the       side of the gastric lumen created by the hiatal hernia, above the       diaphragm, this catching food material.      the PEG bumper was in place on the anterior wall of the stomach, no       inflammation noted.      The exam of the stomach was  otherwise normal.      The examined duodenum was normal, though there is a sharp angulation       noted in the c-loop.      The cardia and gastric fundus were normal on retroflexion otherwise.      A small exophytic mass was found on the anterior soft palate. The mass       is not  obstructing the airway.      Patchy candidiasis was found at the cricopharyngeus.      Ginette Pitman was found in the subglottic space. Impression:        - LA Grade C erosive esophagitis.                    - Medium-sized hiatus hernia.                    - Normal examined duodenum.                    - Small exophytic mass found on the anterior soft palate,                     not obstructing the airway.                    - No specimens collected.                    - patient is at high risk for aspiration. Recommendation:    - limited clear liquid diet indefinitely po. Would obtain                     dietary consult for determination of caloric needs via                     peg. Would not do bolus feeds if possible.                    Allow distal esophagus 3 weeks to heal on bid ppi, then                     repeat EGD to check healing. Should have normal INR at                     that time so biopsies may be done if needed.                    - Return patient to hospital ward for ongoing care.                    - Refer to an ENT specialist.                    - Diflucan (fluconazole) 100 mg IV/PO daily for 1 week. Procedure Code(s): --- Professional ---                    [phone], Esophagogastroduodenoscopy, flexible, transoral;                     diagnostic, including collection of specimen(s) by                     brushing or washing, when performed (separate procedure) Diagnosis Code(s): --- Professional ---                    530.19, Other esophagitis                    530.81, Esophageal reflux                    792.1, Nonspecific abnormal findings in stool contents  553.3,  Diaphragmatic hernia without mention of obstruction                     or gangrene                    478.20, Unspecified disease of pharynx CPT copyright 2014 [employer]. All rights reserved. The codes documented in this report are preliminary and upon coder review may  be revised to meet current compliance requirements. Christena Deem, MD 06/10/2015 3:13:22 PM This report has been signed electronically. Number of Addenda: 0 Note Initiated On: 06/10/2015 2:23 PM      [facility]

## 2015-06-10 NOTE — Consult Note (Addendum)
Subjective: Patient seen for Hemoccult-positive stool, anemia, abdominal pain. Today patient states that he continues with some nausea no vomiting. Abdominal pain is wandering it is at time to the pain in this currently is in the lower abdomen. She has been passing gas/flatus.     Objective: Vital signs in last 24 hours: Temp:  [97.8 F (36.6 C)-98 F (36.7 C)] 97.9 F (36.6 C) (09/21 1350) Pulse Rate:  [38-95] 61 (09/21 1350) Resp:  [16-18] 18 (09/21 1350) BP: (101-114)/(51-81) 114/81 mmHg (09/21 1350) SpO2:  [91 %-96 %] 94 % (09/21 1350) Blood pressure 114/81, pulse 61, temperature 97.9 F (36.6 C), temperature source Oral, resp. rate 18, height 5\' 3"  (1.6 m), weight 56.518 kg (124 lb 9.6 oz), SpO2 94 %.   Intake/Output from previous day: 09/20 0701 - 09/21 0700 In: 1605.4 [I.V.:1605.4] Out: 700 [Urine:700]  Intake/Output this shift: Total I/O In: -  Out: 300 [Urine:300]   General appearance:  80 year old female no acute distress Resp:  Clear to auscultation Cardio:  Regular rate and rhythm GI:  Soft nondistended. Bowel sounds are positive. She is tender to palpation particularly in the right lower quadrant and somewhat less so left lower quadrant. There is some mild discomfort inferior and medially to the PEG. Extremities:  No clubbing cyanosis or edema   Lab Results: Results for orders placed or performed during the hospital encounter of 06/08/15 (from the past 24 hour(s))  Glucose, capillary     Status: Abnormal   Collection Time: 06/09/15  5:23 PM  Result Value Ref Range   Glucose-Capillary 107 (H) 65 - 99 mg/dL   Comment 1 Notify RN   Glucose, capillary     Status: None   Collection Time: 06/09/15  8:11 PM  Result Value Ref Range   Glucose-Capillary 86 65 - 99 mg/dL   Comment 1 Notify RN   Basic metabolic panel     Status: Abnormal   Collection Time: 06/10/15  6:19 AM  Result Value Ref Range   Sodium 153 (H) 135 - 145 mmol/L   Potassium 3.2 (L) 3.5 - 5.1  mmol/L   Chloride 120 (H) 101 - 111 mmol/L   CO2 24 22 - 32 mmol/L   Glucose, Bld 88 65 - 99 mg/dL   BUN 30 (H) 6 - 20 mg/dL   Creatinine, Ser 1.18 (H) 0.44 - 1.00 mg/dL   Calcium 8.8 (L) 8.9 - 10.3 mg/dL   GFR calc non Af Amer 43 (L) >60 mL/min   GFR calc Af Amer 50 (L) >60 mL/min   Anion gap 9 5 - 15  Hemoglobin     Status: Abnormal   Collection Time: 06/10/15  6:19 AM  Result Value Ref Range   Hemoglobin 10.5 (L) 12.0 - 16.0 g/dL  Protime-INR     Status: Abnormal   Collection Time: 06/10/15  6:19 AM  Result Value Ref Range   Prothrombin Time 15.6 (H) 11.4 - 15.0 seconds   INR 1.22   Magnesium     Status: None   Collection Time: 06/10/15  6:19 AM  Result Value Ref Range   Magnesium 1.9 1.7 - 2.4 mg/dL  Glucose, capillary     Status: None   Collection Time: 06/10/15  7:04 AM  Result Value Ref Range   Glucose-Capillary 85 65 - 99 mg/dL  Glucose, capillary     Status: None   Collection Time: 06/10/15 11:08 AM  Result Value Ref Range   Glucose-Capillary 84 65 - 99 mg/dL  Recent Labs  06/08/15 0425 06/08/15 1425 06/09/15 0500 06/10/15 0619  WBC 7.4 8.2 5.5  --   HGB 10.5* 11.6* 10.3* 10.5*  HCT 32.9* 37.4 32.6*  --   PLT 409 466* 379  --    BMET  Recent Labs  06/08/15 1425 06/09/15 0500 06/10/15 0619  NA 151* 153* 153*  K 4.1 3.6 3.2*  CL 117* 120* 120*  CO2 25 24 24   GLUCOSE 160* 126* 88  BUN 56* 46* 30*  CREATININE 1.30* 1.16* 1.18*  CALCIUM 9.1 8.6* 8.8*   LFT  Recent Labs  06/08/15 1425  PROT 5.9*  ALBUMIN 2.3*  AST 19  ALT 9*  ALKPHOS 89  BILITOT 0.7   PT/INR  Recent Labs  06/09/15 0500 06/10/15 0619  LABPROT 19.4* 15.6*  INR 1.62 1.22   Hepatitis Panel No results for input(s): HEPBSAG, HCVAB, HEPAIGM, HEPBIGM in the last 72 hours. C-Diff No results for input(s): CDIFFTOX in the last 72 hours. No results for input(s): CDIFFPCR in the last 72 hours.   Studies/Results: Ct Abdomen Pelvis W Contrast  06/08/2015   CLINICAL  DATA:  Epigastric pain today. Recent admission with evidence of gastric volvulus. Underwent gastropexy and splenectomy July 2016. Poor oral intake since discharge with difficulty swallowing. History right breast cancer diagnosed November 2015. Patient received emergent steroid prep due to contrast allergy.  EXAM: CT ABDOMEN AND PELVIS WITH CONTRAST  TECHNIQUE: Multidetector CT imaging of the abdomen and pelvis was performed using the standard protocol following bolus administration of intravenous contrast.  CONTRAST:  81mL OMNIPAQUE IOHEXOL 300 MG/ML  SOLN  COMPARISON:  03/26/2015 and 03/11/2015  FINDINGS: Lung bases demonstrate mild bibasilar opacification right worse than left likely atelectasis with slight improvement. Mild stable cardiomegaly. A moderate portion of the stomach persists above the diaphragm unchanged. Small amount contrast is present within the portion of the stomach above the diaphragm.  Abdominal images demonstrate a percutaneous gastrostomy tube in adequate position. Evidence of previous splenectomy. The gallbladder, pancreas and adrenal glands are within normal. Kidneys normal in size without hydronephrosis or nephrolithiasis. 2.7 cm left renal cyst unchanged. Couple subcentimeter bilateral renal cortical hypodensities too small to characterize but likely a cysts and unchanged. The ureter is within normal.  Mild calcified plaque over the abdominal aorta.  The appendix is normal. There is moderate diverticulosis of the colon without active inflammation. Small bowel is within normal. Mesentery is within normal. No significant free fluid or focal inflammatory change.  Pelvic images demonstrate the bladder, or uterus, ovaries and rectum to be within normal.  Examination demonstrates moderate spondylosis of the spine with moderate curvature of the thoracolumbar spine convex right and evidence of 2 previous kyphoplasties of L1 on L2 unchanged. Moderate degenerative change of the hips.  IMPRESSION:  No acute findings in the abdomen/ pelvis.  Moderate portion of the stomach remains above the diaphragm unchanged post gastropexy. Gastrostomy tube in adequate position.  Bibasilar opacification slightly improved likely atelectasis.  Left renal cyst unchanged with bilateral subcentimeter renal cortical hypodensities too small to characterize but likely cysts.  Diverticulosis of the colon without active inflammation.  Other chronic findings and postsurgical changes as described unchanged.   Electronically Signed   By: Marin Olp M.D.   On: 06/08/2015 22:20    Scheduled Inpatient Medications:   . antiseptic oral rinse  7 mL Mouth Rinse BID  . ciprofloxacin  500 mg Oral BID  . diclofenac  1 patch Transdermal BID  . hydroxychloroquine  200 mg  Oral Daily  . insulin aspart  0-15 Units Subcutaneous TID WC  . letrozole  2.5 mg Oral Daily  . levothyroxine  150 mcg Oral QAC breakfast  . mometasone-formoterol  2 puff Inhalation BID  .  morphine injection  2 mg Intravenous Once  . mycophenolate  1,000 mg Oral BID  . [START ON 06/12/2015] pantoprazole (PROTONIX) IV  40 mg Intravenous Q12H  . potassium chloride  40 mEq Per Tube Once  . predniSONE  1 mg Oral Daily  . predniSONE  5 mg Oral Daily  . sodium chloride  3 mL Intravenous Q12H  . Tadalafil (PAH)  40 mg Oral Daily    Continuous Inpatient Infusions:   . sodium chloride 75 mL/hr at 06/10/15 0527  . feeding supplement (JEVITY 1.5 CAL/FIBER)    . free water      PRN Inpatient Medications:  acetaminophen **OR** acetaminophen, ALPRAZolam, morphine injection, ondansetron **OR** ondansetron (ZOFRAN) IV  Miscellaneous:   Assessment:  1. Abdominal pain in the setting of recent gastric volvulus and surgery therefore. History of PEG placement at [facility] on 04/16/2015. She getting most of her nutritional needs via the PEG. Several episodes of anemia noted in the past several months requiring transfusion.  Plan:  1. EGD today. I have discussed the  risks benefits and complications of procedures to include not limited to bleeding, infection, perforation and the risk of sedation and the patient wishes to proceed. Further recommendations to follow. Continue PPI as you are  Lollie Sails MD 06/10/2015, 2:13 PM

## 2015-06-10 NOTE — Progress Notes (Signed)
Per MD patient will likely be ready for D/C tomorrow. Clinical Education officer, museum (CSW) made [employer] at [facility] aware of above. CSW will continue to follow and assist as needed.   Blima Rich, [place] [phone]

## 2015-06-10 NOTE — Progress Notes (Signed)
Brief Nutrition Follow-Up  EN: Spoke with MD, Diana [name] this afternoon s/p EGD. Pt to be NPO with ice chips only however ok to resume tube feeding. Will keep order for Jevity 1.5 at 70mL/hr tonight and reassess tolerance tomorrow and or if need maintain continuous feeding 24 hours. Per MD Diana [name] also agreeable to adding free water boluses to aid with elevated sodium. Will recommend 172mL BID of free water and reassess tomorrow as pt remains on 0.45%NS at 58mL/hr. Total free water via Gtube of 915mL.  HIGH Care Level  [name], [place], [place] Pager [phone]

## 2015-06-10 NOTE — Consult Note (Signed)
Multiple recommendations made on EGD procedure note. Please see EGD procedure note. We'll follow with you.

## 2015-06-10 NOTE — Transfer of Care (Signed)
Immediate Anesthesia Transfer of Care Note  Patient: Diana [name]  Procedure(s) Performed: Procedure(s): ESOPHAGOGASTRODUODENOSCOPY (EGD) WITH PROPOFOL (N/A)  Patient Location: PACU  Anesthesia Type:General  Level of Consciousness: awake and alert   Airway & Oxygen Therapy: Patient Spontanous Breathing and Patient connected to nasal cannula oxygen  Post-op Assessment: Report given to RN and Post -op Vital signs reviewed and stable  Post vital signs: Reviewed and stable  Last Vitals:  Filed Vitals:   06/10/15 1455  BP:   Pulse: 102  Temp:   Resp: 24    Complications: No apparent anesthesia complications

## 2015-06-10 NOTE — Anesthesia Postprocedure Evaluation (Signed)
  Anesthesia Post-op Note  Patient: Diana [name]  Procedure(s) Performed: Procedure(s): ESOPHAGOGASTRODUODENOSCOPY (EGD) WITH PROPOFOL (N/A)  Anesthesia type:General  Patient location: PACU  Post pain: Pain level controlled  Post assessment: Post-op Vital signs reviewed, Patient's Cardiovascular Status Stable, Respiratory Function Stable, Patent Airway and No signs of Nausea or vomiting  Post vital signs: Reviewed and stable  Last Vitals:  Filed Vitals:   06/10/15 1547  BP: 104/66  Pulse: 96  Temp: 36.6 C  Resp: 19    Level of consciousness: awake, alert  and patient cooperative  Complications: No apparent anesthesia complications

## 2015-06-10 NOTE — Anesthesia Preprocedure Evaluation (Signed)
Anesthesia Evaluation  Patient identified by MRN, date of birth, ID band Patient awake    Reviewed: Allergy & Precautions, NPO status , Patient's Chart, lab work & pertinent test results, reviewed documented beta blocker date and time   History of Anesthesia Complications Negative for: history of anesthetic complications  Airway Mallampati: II  TM Distance: >3 FB     Dental  (+) Chipped, Partial Lower, Partial Upper   Pulmonary shortness of breath, pneumonia, resolved, former smoker,           Cardiovascular + Peripheral Vascular Disease and +CHF  (-) Past MI      Neuro/Psych  Neuromuscular disease    GI/Hepatic hiatal hernia, GERD  Controlled,  Endo/Other  diabetes, Type 2Hypothyroidism   Renal/GU Renal disease     Musculoskeletal   Abdominal   Peds  Hematology  (+) anemia ,   Anesthesia Other Findings Past Medical History:   Lupus (systemic lupus erythematosus)                         Pulmonary hypertension                                       Pneumonia                                                    Unspecified menopausal and postmenopausal diso*              Primary pulmonary hypertension                               Acute glomerulonephritis with other specified *              Unspecified hypothyroidism                                   Shingles (herpes zoster) polyneuropathy         March 2013     Comment: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   Hiatal hernia                                                DVT (deep venous thrombosis)                                 Cancer                                                         Comment:Breast   CHF (congestive heart failure)                               Renal insufficiency  Reproductive/Obstetrics                             Anesthesia Physical  Anesthesia Plan  ASA: III  Anesthesia  Plan: General   Post-op Pain Management:    Induction: Intravenous  Airway Management Planned: Nasal Cannula  Additional Equipment:   Intra-op Plan:   Post-operative Plan:   Informed Consent: I have reviewed the patients History and Physical, chart, labs and discussed the procedure including the risks, benefits and alternatives for the proposed anesthesia with the patient or authorized representative who has indicated his/her understanding and acceptance.     Plan Discussed with: CRNA  Anesthesia Plan Comments:         Anesthesia Quick Evaluation

## 2015-06-11 ENCOUNTER — Inpatient Hospital Stay: Payer: Medicare Other

## 2015-06-11 ENCOUNTER — Encounter: Payer: Self-pay | Admitting: Gastroenterology

## 2015-06-11 LAB — GLUCOSE, CAPILLARY
GLUCOSE-CAPILLARY: 110 mg/dL — AB (ref 65–99)
GLUCOSE-CAPILLARY: 96 mg/dL (ref 65–99)
GLUCOSE-CAPILLARY: 146 mg/dL — AB (ref 65–99)
Glucose-Capillary: 104 mg/dL — ABNORMAL HIGH (ref 65–99)

## 2015-06-11 LAB — POTASSIUM
POTASSIUM: 3.3 mmol/L — AB (ref 3.5–5.1)
Potassium: 3.9 mmol/L (ref 3.5–5.1)

## 2015-06-11 MED ORDER — POTASSIUM CHLORIDE 10 MEQ/100ML IV SOLN
10.0000 meq | INTRAVENOUS | Status: AC
  Administered 2015-06-11 (×4): 10 meq via INTRAVENOUS
  Filled 2015-06-11 (×4): qty 100

## 2015-06-11 MED ORDER — JEVITY 1.5 CAL/FIBER PO LIQD
1000.0000 mL | ORAL | Status: DC
  Administered 2015-06-11: 1000 mL

## 2015-06-11 MED ORDER — BOOST / RESOURCE BREEZE PO LIQD
1.0000 | Freq: Three times a day (TID) | ORAL | Status: DC
  Administered 2015-06-12 – 2015-06-13 (×3): 1 via ORAL

## 2015-06-11 MED ORDER — FLUCONAZOLE 100 MG PO TABS
100.0000 mg | ORAL_TABLET | Freq: Every day | ORAL | Status: DC
  Administered 2015-06-11 – 2015-06-13 (×2): 100 mg
  Filled 2015-06-11 (×3): qty 1

## 2015-06-11 MED ORDER — DEXTROSE 5 % IV SOLN
INTRAVENOUS | Status: DC
  Administered 2015-06-11 – 2015-06-12 (×2): via INTRAVENOUS

## 2015-06-11 NOTE — Progress Notes (Addendum)
Nutrition Follow-up   INTERVENTION:   EN: Discussed nutritional poc with MD Skulski and pt this am. Per MD, in the interim nutritional needs need to be met via PEG. Per MD request will adjust TF regimen to an 18 hour feed at this time. Recommend goal rate of 27m/hr of Jevity 1.5 for 18 hours to provide a total of 1485kcals and 63g protein, 12065mof free water if flushes of 2561mr maintained during 18 hour feed. Will start today with regimen of 56m66m for 18 hours.  If tolerating will increase to 55mL49mtomorrow. Will maintain free water flushes of 150mL 8mper MD Konidena's note at this time to aid with hypernatremia.  Will also [facility] room per MD recommendations.  Meals and Snacks: Cater to patient preferences on CL as pt does not like broth. Per MD CL will be pleasure feeds at this point.  Medical Food Supplement Therapy: per MD SkulskDonnella Shamalso send Boost Breeze po TID, each supplement provides 250 kcal and 9 grams of protein   NUTRITION DIAGNOSIS:   Inadequate oral intake related to altered GI function as evidenced by per patient/family report.  GOAL:   Patient will meet greater than or equal to 90% of their needs  MONITOR:    (Energy Intake, Anthropometrics, Digestive system, Electrolyte and Renal Profile)  REASON FOR ASSESSMENT:   Consult Enteral/tube feeding initiation and management  ASSESSMENT:   Pt admitted with rectal bleed. GI consulted per MD note. Pt s/p Gastrostomy tube placement one month ago secondary to FTT and weight loss.   S/p EGD yesterday, per MD note consistent with grade C esophagitis, hiatal hernia and small exophytic mass present. Per MD plan to re-scope in 3 weeks to assess healing.  Diet Order:  Diet clear liquid Room service appropriate?: Yes; Fluid consistency:: Thin    Current Nutrition: Per Nsg, pt received TF last night until 5am, whereupon the pump was disconnected, therefore pt got an 8 hour feed (<500kcals total)  overnight   Gastrointestinal Profile: minimal to no residuals per documentation Last BM: black, green stools times 3 over the past 24 hours   Medications: Diflucan, Prednisone, Protonix, D5 at 50mL/h69mroviding 204kcals in 24 hours)  Electrolyte/Renal Profile and Glucose Profile:   Recent Labs Lab 06/08/15 1425 06/09/15 0500 06/10/15 0619 06/11/15 0708  NA 151* 153* 153*  --   K 4.1 3.6 3.2* 3.3*  CL 117* 120* 120*  --   CO2 _0 --   BUN 56* 46* 30*  --   CREATININE 1.30* 1.16* 1.18*  --   CALCIUM 9.1 8.6* 8.8*  --   MG  --   --  1.9  --   GLUCOSE 160* 126* 88  --    Protein Profile:  Recent Labs Lab 06/05/15 1050 06/08/15 0425 06/08/15 1425  ALBUMIN 2.2* 1.9* 2.3*    Weight Trend since Admission: Filed Weights   06/08/15 1405 06/09/15 0145 06/10/15 1413  Weight: 124 lb (56.246 kg) 124 lb 9.6 oz (56.518 kg) 124 lb (56.246 kg)     BMI:  Body mass index is 21.97 kg/(m^2).  Estimated Nutritional Needs:   Kcal:  BEE: 1014kcals, TEE: (IF 1.2-1.4)(AF 1.2) 1460-1703kcals  Protein:  57-68g protein (1.0-1.2g/kg)   Fluid:  1413-1695mL of76mid (25-30mL/kg)73mDUCATION NEEDS:   No education needs identified at this time   [place] Pager [phone]

## 2015-06-11 NOTE — Consult Note (Signed)
[facility] VEIN [facility] Vascular Consult Note  MRN : [id number]  Diana [name] is a 80 y.o. (1935/04/09) female who presents with chief complaint of  Chief Complaint  Patient presents with  . Abdominal Pain  .  History of Present Illness: Patient is a 80 yo female admitted with anemia and GI bleeding.  Has been found to have esophagitis and erosions. Has been on coumadin for DVT now for what she thinks is several years and was on this at admission.  This has had to be stopped. She is quite immobile and has chronic LE swelling.  No new pain or problems with her legs.  We are consulted for IVC filter placement. To evaluate for the need for a filter a duplex was done today.  She did not have any DVT present two months ago, but on a duplex today right profunda and common femoral vein DVT was seen.    Current Facility-Administered Medications  Medication Dose Route Frequency Provider Last Rate Last Dose  . acetaminophen (TYLENOL) tablet 650 mg  650 mg Oral Q6H PRN Oralia Manis, MD       Or  . acetaminophen (TYLENOL) suppository 650 mg  650 mg Rectal Q6H PRN Oralia Manis, MD      . ALPRAZolam Prudy Feeler) tablet 0.25 mg  0.25 mg Oral QHS PRN Oralia Manis, MD   0.25 mg at 06/09/15 2139  . antiseptic oral rinse (CPC / CETYLPYRIDINIUM CHLORIDE 0.05%) solution 7 mL  7 mL Mouth Rinse BID Oralia Manis, MD   7 mL at 06/11/15 1242  . ciprofloxacin (CIPRO) tablet 500 mg  500 mg Oral BID Enedina Finner, MD   500 mg at 06/11/15 0923  . dextrose 5 % solution   Intravenous Continuous Katha Hamming, MD 50 mL/hr at 06/11/15 7829    . diclofenac (FLECTOR) 1.3 % 1 patch  1 patch Transdermal BID Enedina Finner, MD   1 patch at 06/11/15 1200  . feeding supplement (BOOST / RESOURCE BREEZE) liquid 1 Container  1 Container Oral TID WC Christena Deem, MD      . feeding supplement (JEVITY 1.5 CAL/FIBER) liquid 1,000 mL  1,000 mL Per Tube Continuous Christena Deem, MD      . fluconazole (DIFLUCAN) tablet  100 mg  100 mg Per Tube Daily Katha Hamming, MD   100 mg at 06/11/15 1256  . free water 150 mL  150 mL Per Tube BID Enedina Finner, MD   150 mL at 06/10/15 1726  . free water 25 mL  25 mL Per Tube Continuous Christena Deem, MD   25 mL at 06/10/15 2157  . hydroxychloroquine (PLAQUENIL) tablet 200 mg  200 mg Oral Daily Oralia Manis, MD   200 mg at 06/11/15 1256  . insulin aspart (novoLOG) injection 0-15 Units  0-15 Units Subcutaneous TID WC Enedina Finner, MD   0 Units at 06/09/15 1144  . letrozole [facility]) tablet 2.5 mg  2.5 mg Oral Daily Oralia Manis, MD   2.5 mg at 06/11/15 1608  . levothyroxine (SYNTHROID, LEVOTHROID) tablet 150 mcg  150 mcg Oral QAC breakfast Oralia Manis, MD   150 mcg at 06/11/15 5621  . mometasone-formoterol (DULERA) 100-5 MCG/ACT inhaler 2 puff  2 puff Inhalation BID Oralia Manis, MD   2 puff at 06/11/15 [phone]  . morphine 2 MG/ML injection 2 mg  2 mg Intravenous Once Governor Rooks, MD   Stopped at 06/08/15 1919  . morphine 2 MG/ML injection 2 mg  2  mg Intravenous Q4H PRN Oralia Manis, MD      . mycophenolate (CELLCEPT) capsule 1,000 mg  1,000 mg Oral BID Enedina Finner, MD   1,000 mg at 06/11/15 1258  . ondansetron (ZOFRAN) tablet 4 mg  4 mg Oral Q6H PRN Oralia Manis, MD   4 mg at 06/10/15 1847   Or  . ondansetron [facility]) injection 4 mg  4 mg Intravenous Q6H PRN Oralia Manis, MD   4 mg at 06/10/15 5784  . [START ON 06/12/2015] pantoprazole (PROTONIX) injection 40 mg  40 mg Intravenous Q12H Oralia Manis, MD      . predniSONE (DELTASONE) tablet 1 mg  1 mg Oral Daily Oralia Manis, MD   1 mg at 06/11/15 1257  . predniSONE (DELTASONE) tablet 5 mg  5 mg Oral Daily Oralia Manis, MD   5 mg at 06/10/15 0911  . sodium chloride (OCEAN) 0.65 % nasal spray 1 spray  1 spray Each Nare PRN Enedina Finner, MD   1 spray at 06/10/15 1749  . sodium chloride 0.9 % injection 3 mL  3 mL Intravenous Q12H Oralia Manis, MD   3 mL at 06/10/15 0914  . Tadalafil (PAH) TABS 40 mg  40 mg Oral Daily Enedina Finner, MD    40 mg at 06/11/15 1257    Past Medical History  Diagnosis Date  . Lupus (systemic lupus erythematosus)   . Pulmonary hypertension   . Pneumonia   . Unspecified menopausal and postmenopausal disorder   . Primary pulmonary hypertension   . Acute glomerulonephritis with other specified pathological lesion in kidney in disease classified elsewhere(580.81)   . Unspecified hypothyroidism   . Shingles (herpes zoster) polyneuropathy March 2013    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  . Hiatal hernia   . DVT (deep venous thrombosis)   . Cancer     Breast  . CHF (congestive heart failure)   . Renal insufficiency     Past Surgical History  Procedure Laterality Date  . Kyphoplasty N/A 02/10/2015    Procedure: [id number];  Surgeon: Kennedy Bucker, MD;  Location: [facility] ORS;  Service: Orthopedics;  Laterality: N/A;  . Laparotomy N/A 03/11/2015    Procedure: REDUCTION OF GASTRIC VOLVULUS, SPLEENECTOMY, GASTRIC TUBE PLACEMENT;  Surgeon: Earline Mayotte, MD;  Location: [facility] ORS;  Service: General;  Laterality: N/A;  . Esophagogastroduodenoscopy (egd) with propofol N/A 03/28/2015    Procedure: ESOPHAGOGASTRODUODENOSCOPY (EGD) WITH PROPOFOL;  Surgeon: Earline Mayotte, MD;  Location: [facility] ENDOSCOPY;  Service: Endoscopy;  Laterality: N/A;  . Esophagogastroduodenoscopy (egd) with propofol N/A 06/10/2015    Procedure: ESOPHAGOGASTRODUODENOSCOPY (EGD) WITH PROPOFOL;  Surgeon: Christena Deem, MD;  Location: [facility] ENDOSCOPY;  Service: Endoscopy;  Laterality: N/A;    Social History Social History  Substance Use Topics  . Smoking status: Former Smoker    Quit date: 05/25/1991  . Smokeless tobacco: Never Used  . Alcohol Use: Yes     Comment: rare  No IVDU  Family History Family History  Problem Relation Age of Onset  . Colon cancer Mother   . Alcohol abuse Sister   . Breast cancer Sister   . Cancer Brother   . Lung cancer Son     Allergies  Allergen Reactions  . Amoxicillin Other (See  Comments)    Reaction:  Stomach cramps   . Cefuroxime Itching  . Contrast Media [Iodinated Diagnostic Agents] Hives  . Cephalexin Rash  . Sulfonamide Derivatives Rash  . Tramadol Hcl Rash  . Venlafaxine Rash  REVIEW OF SYSTEMS (Negative unless checked)  Constitutional: [] Weight loss  [] Fever  [] Chills Cardiac: [] Chest pain   [] Chest pressure   [] Palpitations   [] Shortness of breath when laying flat   [] Shortness of breath at rest   [] Shortness of breath with exertion. Vascular:  [] Pain in legs with walking   [] Pain in legs at rest   [] Pain in legs when laying flat   [] Claudication   [] Pain in feet when walking  [] Pain in feet at rest  [] Pain in feet when laying flat   [x] History of DVT   [] Phlebitis   [] Swelling in legs   [] Varicose veins   [] Non-healing ulcers Pulmonary:   [] Uses home oxygen   [] Productive cough   [] Hemoptysis   [] Wheeze  [] COPD   [] Asthma Neurologic:  [] Dizziness  [] Blackouts   [] Seizures   [] History of stroke   [] History of TIA  [] Aphasia   [] Temporary blindness   [] Dysphagia   [] Weakness or numbness in arms   [] Weakness or numbness in legs Musculoskeletal:  [] Arthritis   [] Joint swelling   [] Joint pain   [] Low back pain Hematologic:  [] Easy bruising  [x] Easy bleeding   [] Hypercoagulable state   [x] Anemic  [] Hepatitis Gastrointestinal:  [x] Blood in stool   [] Vomiting blood  [x] Gastroesophageal reflux/heartburn   [] Difficulty swallowing. Genitourinary:  [] Chronic kidney disease   [] Difficult urination  [] Frequent urination  [] Burning with urination   [] Blood in urine Skin:  [] Rashes   [] Ulcers   [] Wounds Psychological:  [] History of anxiety   []  History of major depression.  Physical Examination  Filed Vitals:   06/10/15 2108 06/11/15 0458 06/11/15 1126 06/11/15 1532  BP: 119/75 110/68  117/54  Pulse: 98 105 103 99  Temp: 98.6 F (37 C) 99 F (37.2 C)  98.2 F (36.8 C)  TempSrc: Oral Oral  Oral  Resp: 20 18  18   Height:      Weight:      SpO2: 93% 95% 93%  96%   Body mass index is 21.97 kg/(m^2). Gen:  WD/WN, NAD but debilitated appearing Head: Rand/AT, Prominent temp pulse not noted. Ear/Nose/Throat: Hearing grossly intact, nares w/o erythema or drainage, oropharynx w/o Erythema/Exudate Eyes: PERRLA, EOMI.  Neck: Supple, no nuchal rigidity.  No bruit or JVD.  Pulmonary:  Good air movement, clear to auscultation bilaterally.  Cardiac: RRR, normal S1, S2, no Murmurs, rubs or gallops. Vascular:  Vessel Right Left  Radial Palpable Palpable  Ulnar Palpable Palpable  Brachial Palpable Palpable  Carotid Palpable, without bruit Palpable, without bruit  Aorta Not palpable N/A  Femoral Palpable Palpable  Popliteal Palpable Palpable  PT Not Palpable Not Palpable  DP Weakly Palpable Palpable   Gastrointestinal: soft, non-tender/non-distended. No guarding/reflex. No masses, surgical incisions, or scars. Musculoskeletal: M/S 5/5 throughout.  Extremities without ischemic changes.  No deformity or atrophy. Mild to moderate edema. Neurologic: CN 2-12 intact. Pain and light touch intact in extremities.  Symmetrical.  Speech is fluent. Motor exam as listed above. Psychiatric: Judgment intact, Mood & affect appropriate for pt's clinical situation. Dermatologic: No rashes or ulcers noted.  No cellulitis or open wounds. Lymph : No Cervical, Axillary, or Inguinal lymphadenopathy.      CBC Lab Results  Component Value Date   WBC 5.5 06/09/2015   HGB 10.5* 06/10/2015   HCT 32.6* 06/09/2015   MCV 88.2 06/09/2015   PLT 379 06/09/2015    BMET    Component Value Date/Time   NA 153* 06/10/2015 0619   NA 143 03/24/2015 1212  NA 146* 10/02/2014 1103   K 3.3* 06/11/2015 0708   K 4.4 01/13/2015 1429   CL 120* 06/10/2015 0619   CL 107 10/02/2014 1103   CO2 24 06/10/2015 0619   CO2 27 10/02/2014 1103   GLUCOSE 88 06/10/2015 0619   GLUCOSE 126* 03/24/2015 1212   GLUCOSE 110* 10/02/2014 1103   BUN 30* 06/10/2015 0619   BUN 32* 03/24/2015 1212    BUN 19* 10/02/2014 1103   CREATININE 1.18* 06/10/2015 0619   CREATININE 1.31* 10/02/2014 1103   CREATININE 1.07 01/02/2014 1644   CALCIUM 8.8* 06/10/2015 0619   CALCIUM 8.8 10/02/2014 1103   GFRNONAA 43* 06/10/2015 0619   GFRNONAA 42* 10/02/2014 1103   GFRNONAA 40* 06/08/2014 1448   GFRNONAA 55* 01/08/2013 1214   GFRAA 50* 06/10/2015 0619   GFRAA 51* 10/02/2014 1103   GFRAA 46* 06/08/2014 1448   GFRAA 63 01/08/2013 1214   Estimated Creatinine Clearance: 32.5 mL/min (by C-G formula based on Cr of 1.18).  COAG Lab Results  Component Value Date   INR 1.22 06/10/2015   INR 1.62 06/09/2015   INR 2.05 06/08/2015    Radiology Ct Abdomen Pelvis W Contrast  06/08/2015   CLINICAL DATA:  Epigastric pain today. Recent admission with evidence of gastric volvulus. Underwent gastropexy and splenectomy July 2016. Poor oral intake since discharge with difficulty swallowing. History right breast cancer diagnosed November 2015. Patient received emergent steroid prep due to contrast allergy.  EXAM: CT ABDOMEN AND PELVIS WITH CONTRAST  TECHNIQUE: Multidetector CT imaging of the abdomen and pelvis was performed using the standard protocol following bolus administration of intravenous contrast.  CONTRAST:  75mL OMNIPAQUE IOHEXOL 300 MG/ML  SOLN  COMPARISON:  03/26/2015 and 03/11/2015  FINDINGS: Lung bases demonstrate mild bibasilar opacification right worse than left likely atelectasis with slight improvement. Mild stable cardiomegaly. A moderate portion of the stomach persists above the diaphragm unchanged. Small amount contrast is present within the portion of the stomach above the diaphragm.  Abdominal images demonstrate a percutaneous gastrostomy tube in adequate position. Evidence of previous splenectomy. The gallbladder, pancreas and adrenal glands are within normal. Kidneys normal in size without hydronephrosis or nephrolithiasis. 2.7 cm left renal cyst unchanged. Couple subcentimeter bilateral renal  cortical hypodensities too small to characterize but likely a cysts and unchanged. The ureter is within normal.  Mild calcified plaque over the abdominal aorta.  The appendix is normal. There is moderate diverticulosis of the colon without active inflammation. Small bowel is within normal. Mesentery is within normal. No significant free fluid or focal inflammatory change.  Pelvic images demonstrate the bladder, or uterus, ovaries and rectum to be within normal.  Examination demonstrates moderate spondylosis of the spine with moderate curvature of the thoracolumbar spine convex right and evidence of 2 previous kyphoplasties of L1 on L2 unchanged. Moderate degenerative change of the hips.  IMPRESSION: No acute findings in the abdomen/ pelvis.  Moderate portion of the stomach remains above the diaphragm unchanged post gastropexy. Gastrostomy tube in adequate position.  Bibasilar opacification slightly improved likely atelectasis.  Left renal cyst unchanged with bilateral subcentimeter renal cortical hypodensities too small to characterize but likely cysts.  Diverticulosis of the colon without active inflammation.  Other chronic findings and postsurgical changes as described unchanged.   Electronically Signed   By: Elberta Fortis M.D.   On: 06/08/2015 22:20   US Venous Img Lower Bilateral  06/11/2015   CLINICAL DATA:  History of deep venous thrombosis with recent GI bleed in need  for discontinuing anti coagulation  EXAM: BILATERAL LOWER EXTREMITY VENOUS DOPPLER ULTRASOUND  TECHNIQUE: Gray-scale sonography with graded compression, as well as color Doppler and duplex ultrasound were performed to evaluate the lower extremity deep venous systems from the level of the common femoral vein and including the common femoral, femoral, profunda femoral, popliteal and calf veins including the posterior tibial, peroneal and gastrocnemius veins when visible. The superficial great saphenous vein was also interrogated. Spectral  Doppler was utilized to evaluate flow at rest and with distal augmentation maneuvers in the common femoral, femoral and popliteal veins.  COMPARISON:  03/31/2015  FINDINGS: RIGHT LOWER EXTREMITY  Common Femoral Vein: There is thrombus extending into the common femoral vein from the profunda femoral vein. Decreased compressibility is noted.  Saphenofemoral Junction: No evidence of thrombus. Normal compressibility and flow on color Doppler imaging.  Profunda Femoral Vein: Thrombus is noted within the profunda femoral vein extending across the junction of the profunda and superficial femoral vein. Decreased compressibility is noted.  Femoral Vein: No evidence of thrombus. Normal compressibility, respiratory phasicity and response to augmentation.  Popliteal Vein: No evidence of thrombus. Normal compressibility, respiratory phasicity and response to augmentation.  Calf Veins: No evidence of thrombus. Normal compressibility and flow on color Doppler imaging.  Superficial Great Saphenous Vein: No evidence of thrombus. Normal compressibility and flow on color Doppler imaging.  Venous Reflux:  None.  Other Findings:  None.  LEFT LOWER EXTREMITY  Common Femoral Vein: No evidence of thrombus. Normal compressibility, respiratory phasicity and response to augmentation.  Saphenofemoral Junction: No evidence of thrombus. Normal compressibility and flow on color Doppler imaging.  Profunda Femoral Vein: No evidence of thrombus. Normal compressibility and flow on color Doppler imaging.  Femoral Vein: No evidence of thrombus. Normal compressibility, respiratory phasicity and response to augmentation.  Popliteal Vein: No evidence of thrombus. Normal compressibility, respiratory phasicity and response to augmentation.  Calf Veins: No evidence of thrombus. Normal compressibility and flow on color Doppler imaging.  Superficial Great Saphenous Vein: No evidence of thrombus. Normal compressibility and flow on color Doppler imaging.  Venous  Reflux:  None.  Other Findings:  None.  IMPRESSION: No deep venous thrombosis is noted on the left.  On the right, there is evidence of thrombus extending from the profunda femoral vein into the common femoral vein.  These results will be called to the ordering clinician or representative by the Radiologist Assistant, and communication documented in the PACS or zVision Dashboard.   Electronically Signed   By: Alcide Clever M.D.   On: 06/11/2015 15:45      Assessment/Plan 1. RLE DVT with GI bleed requiring cessation of anticoagulation. This would be a clear indication for an IVC filter.  This thrombus was not present on a duplex about two months ago, so this would appear to be an acute DVT and not a chronic DVT.  I have discussed the high risk of embolization without treatment, and would recommend placement of an IVC filter.  The patient wants to talk this over with her family and does not want to have this done right now.  I would recommend she discuss with her family, and if they have any questions I will be happy to explain the reasons to consider IVC filter. As for now, this has not been scheduled at her request. 2. GI bleed. Esophagitis and erosions on EGD and can't be on anticoagulation.  Should resolve medically.   3. Hypernatremia.  Primary service managing. 4. Pulmonary hypertension. Stable currently.  5. CKD. stable   DEW,JASON, MD  06/11/2015 4:43 PM

## 2015-06-11 NOTE — Progress Notes (Signed)
[place] bil carotid doppler lower ext.  Rt le dvt noted . Dr Jasmine Pang  Notified. Dr dew called and  He spoke with pt re ivc filter placement. Pt  Says she does not want a filter.  She will talk with family re  This.  k = 3.9 this pm.  Started  Cont tube feeds at  1500 at 40 ml / hr. Dietary saw today and put in place.  Dr Donnella Sham saw today/ dr Pryor Ochoa saw.Marland Kitchenassisted to br

## 2015-06-11 NOTE — Plan of Care (Signed)
Problem: Discharge Progression Outcomes Goal: Discharge plan in place and appropriate Outcome: Progressing md reports 2-3 days if stable Goal: Hemodynamically stable Outcome: Not Progressing Rt le dvt. Does not want ivc filter Goal: Tolerating diet Outcome: Progressing tol tube feed  Since start this pm.  On clear liqs but not taking a lot of this Goal: Activity appropriate for discharge plan Outcome: Progressing amb to br with assist. Cont weak.

## 2015-06-11 NOTE — Progress Notes (Addendum)
MEDICATION RELATED CONSULT NOTE - INITIAL   Pharmacy Consult for electrolyte management Indication: hypokalemia  Allergies  Allergen Reactions  . Amoxicillin Other (See Comments)    Reaction:  Stomach cramps   . Cefuroxime Itching  . Contrast Media [Iodinated Diagnostic Agents] Hives  . Cephalexin Rash  . Sulfonamide Derivatives Rash  . Tramadol Hcl Rash  . Venlafaxine Rash    Patient Measurements: Height: 5\' 3"  (160 cm) Weight: 124 lb (56.246 kg) IBW/kg (Calculated) : 52.4  Vital Signs: Temp: 99 F (37.2 C) (09/22 0458) Temp Source: Oral (09/22 0458) BP: 110/68 mmHg (09/22 0458) Pulse Rate: 105 (09/22 0458) Intake/Output from previous day: 09/21 0701 - 09/22 0700 In: 2651.5 [I.V.:2099.5; NG/GT:150] Out: 550 [Urine:550]  Labs:  Recent Labs  06/08/15 1425 06/09/15 0500 06/10/15 0619  WBC 8.2 5.5  --   HGB 11.6* 10.3* 10.5*  HCT 37.4 32.6*  --   PLT 466* 379  --   CREATININE 1.30* 1.16* 1.18*  MG  --   --  1.9  ALBUMIN 2.3*  --   --   PROT 5.9*  --   --   AST 19  --   --   ALT 9*  --   --   ALKPHOS 89  --   --   BILITOT 0.7  --   --    Estimated Creatinine Clearance: 32.5 mL/min (by C-G formula based on Cr of 1.18).  Microbiology: Recent Results (from the past 720 hour(s))  Urine culture     Status: None   Collection Time: 05/29/15  5:30 PM  Result Value Ref Range Status   Specimen Description URINE, RANDOM  Final   Special Requests NONE  Final   Culture INSIGNIFICANT GROWTH  Final   Report Status 05/31/2015 FINAL  Final  Urine culture     Status: None   Collection Time: 06/01/15  9:10 AM  Result Value Ref Range Status   Specimen Description URINE, RANDOM  Final   Special Requests NONE  Final   Culture MULTIPLE SPECIES PRESENT, SUGGEST RECOLLECTION  Final   Report Status 06/03/2015 FINAL  Final  Wound culture     Status: None (Preliminary result)   Collection Time: 06/08/15 11:30 AM  Result Value Ref Range Status   Specimen Description PEG SITE   Final   Special Requests NONE  Final   Gram Stain   Final    RARE WBC SEEN RARE GRAM POSITIVE COCCI FEW GRAM NEGATIVE RODS    Culture   Final    HEAVY GROWTH GRAM NEGATIVE RODS MODERATE GROWTH GRAM NEGATIVE RODS IDENTIFICATION AND SUSCEPTIBILITIES TO FOLLOW    Report Status PENDING  Incomplete  MRSA PCR Screening     Status: None   Collection Time: 06/09/15  2:27 AM  Result Value Ref Range Status   MRSA by PCR NEGATIVE NEGATIVE Final    Comment:        The GeneXpert MRSA Assay (FDA approved for NASAL specimens only), is one component of a comprehensive MRSA colonization surveillance program. It is not intended to diagnose MRSA infection nor to guide or monitor treatment for MRSA infections.     Medical History: Past Medical History  Diagnosis Date  . Lupus (systemic lupus erythematosus)   . Pulmonary hypertension   . Pneumonia   . Unspecified menopausal and postmenopausal disorder   . Primary pulmonary hypertension   . Acute glomerulonephritis with other specified pathological lesion in kidney in disease classified elsewhere(580.81)   . Unspecified hypothyroidism   .  Shingles (herpes zoster) polyneuropathy March 2013    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  . Hiatal hernia   . DVT (deep venous thrombosis)   . Cancer     Breast  . CHF (congestive heart failure)   . Renal insufficiency     Medications:  Scheduled:  . antiseptic oral rinse  7 mL Mouth Rinse BID  . ciprofloxacin  500 mg Oral BID  . diclofenac  1 patch Transdermal BID  . free water  150 mL Per Tube BID  . hydroxychloroquine  200 mg Oral Daily  . insulin aspart  0-15 Units Subcutaneous TID WC  . letrozole  2.5 mg Oral Daily  . levothyroxine  150 mcg Oral QAC breakfast  . mometasone-formoterol  2 puff Inhalation BID  .  morphine injection  2 mg Intravenous Once  . mycophenolate  1,000 mg Oral BID  . [START ON 06/12/2015] pantoprazole (PROTONIX) IV  40 mg Intravenous Q12H  . potassium chloride  10  mEq Intravenous Q1 Hr x 4  . predniSONE  1 mg Oral Daily  . predniSONE  5 mg Oral Daily  . sodium chloride  3 mL Intravenous Q12H  . Tadalafil (PAH)  40 mg Oral Daily   Infusions:  . dextrose    . feeding supplement (JEVITY 1.5 CAL/FIBER) 1,000 mL (06/10/15 2156)  . free water      Assessment: PD is a 80 yo female admitted for epigastric pain. Pharmacy is consulted to manage electrolytes in this patient.  Plan:  9/21 K+ 3.2, Mag 1.9 9/22 K+ 3.3  Pt initiated on KCl 36mEq IV x4.  Recheck K at 1500 today and adjust supplementation accordingly.  Pharmacy will continue to monitor.  Vena Rua 06/11/2015,8:13 AM

## 2015-06-11 NOTE — Plan of Care (Signed)
Problem: Discharge Progression Outcomes Goal: [place] arrangements in place Outcome: Not Applicable Date Met:  46/28/63 Pt going to [employer], possibly today.  Goal: Other Discharge Outcomes/Goals Outcome: Progressing Plan of care progress to goal Pt with no c/o pain. Initiated Jevity 1.5 TF at 73m/hr. Pt has had no TF residuals nor abdominal distention. No nausea or vomiting. Pt said she would let the tube feeding run until 5 am then she wants it cut off. This wProbation officerhas tried to encourage patient to let the TF run for the allotted time (12hrs) but will have to honor her rights if she wants it stopped. Otherwise pt has had an uneventful evening. Had two bowel movements.

## 2015-06-11 NOTE — Care Management Important Message (Signed)
Important Message  Patient Details  Name: Diana [name] MRN: [id number] Date of Birth: Feb 06, 1935   Medicare Important Message Given:  Yes-second notification given    Darius Bump Allmond 06/11/2015, 9:43 AM

## 2015-06-11 NOTE — Evaluation (Signed)
Physical Therapy Evaluation Patient Details Name: Diana [name] MRN: [id number] DOB: 1934-10-06 Today's Date: 06/11/2015   History of Present Illness  presented to ER from STR secondary to worsening abdominal pain, rectal bleeding; admitted with GIB.  Status post EGD 9/21 significant for erosive esophagitis.  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  Clinical Impression  Upon evaluation, patient alert and oriented to basic information; follows commands.  Notably fatigued.  Demonstrates strength and ROM grossly WFL for basic transfers and mobility, but generally deconditioned due to recent medical course.  Currently requiring min/mod assist for bed mobility; min assist for sit/stand, basic transfers and short-distance gait (60') with RW.  Slow and effortful, but no overt buckling/LOB; limited ability to respond to external perturbations, high fall risk noted.  Additional distance/activity declined by patient due to pain (5/10 in back) and fatigue. Would benefit from skilled PT to address above deficits and promote optimal return to PLOF; recommend transition to STR upon discharge from acute hospitalization.     Follow Up Recommendations SNF    Equipment Recommendations       Recommendations for Other Services       Precautions / Restrictions Precautions Precautions: Fall Precaution Comments: No BP L UE, recent compression fractures/kyphoplasty, PEG/clear liquids Restrictions Weight Bearing Restrictions: No      Mobility  Bed Mobility Overal bed mobility: Needs Assistance Bed Mobility: Supine to Sit;Sit to Supine     Supine to sit: Min assist Sit to supine: Mod assist   General bed mobility comments: assist for LE management due to back pain  Transfers Overall transfer level: Needs assistance Equipment used: Rolling walker (2 wheeled) Transfers: Sit to/from  Stand Sit to Stand: Min assist            Ambulation/Gait Ambulation/Gait assistance: Min assist Ambulation Distance (Feet): 60 Feet Assistive device: Rolling walker (2 wheeled)       General Gait Details: short, shuffling steps with decreased step height/length; slow, effortful gait speed, but no overt buckling or LOB.  Forward flexed posture.  Min cuing to maintain RW with patient at all times (tends to step away from when approaching seating surfaces)  Stairs            Wheelchair Mobility    Modified Rankin (Stroke Patients Only)       Balance Overall balance assessment: Needs assistance Sitting-balance support: No upper extremity supported;Feet supported Sitting balance-Leahy Scale: Good     Standing balance support: Bilateral upper extremity supported Standing balance-Leahy Scale: Fair                               Pertinent Vitals/Pain Pain Assessment: 0-10 Pain Score: 5  Pain Location: back Pain Descriptors / Indicators: Aching Pain Intervention(s): Limited activity within patient's tolerance;Monitored during session;Repositioned    Home Living Family/patient expects to be discharged to:: Skilled nursing facility                 Additional Comments: At true baseline, lives with son in single-story home with 1 step to enter; ambulatory without assist device as primary mobility, indep with ADLs, household/community activities.  Denies fall history.  Has been at [facility] since abdominal surgery     Prior Function Level of Independence: Independent         Comments: Baseline, indep with household/community mobility without assist device;  recently using RW since participation with STR     Hand Dominance        Extremity/Trunk Assessment   Upper Extremity Assessment: Generalized weakness (globally 3-/5 throughout bilat UEs)           Lower Extremity Assessment: Generalized weakness (globally 3-/5 throughout bilat LEs)       Cervical / Trunk Assessment:  (lower thoracic/lumbar scoliosis with curvature towards R)  Communication   Communication: No difficulties  Cognition Arousal/Alertness: Awake/alert Behavior During Therapy: WFL for tasks assessed/performed;Flat affect Overall Cognitive Status: Within Functional Limits for tasks assessed                      General Comments      Exercises        Assessment/Plan    PT Assessment Patient needs continued PT services  PT Diagnosis Difficulty walking;Generalized weakness   PT Problem List Decreased strength;Decreased range of motion;Decreased activity tolerance;Decreased balance;Decreased mobility;Decreased knowledge of use of DME;Decreased safety awareness;Decreased knowledge of precautions;Pain  PT Treatment Interventions DME instruction;Gait training;Stair training;Functional mobility training;Therapeutic activities;Therapeutic exercise;Balance training;Patient/family education   PT Goals (Current goals can be found in the Care Plan section) Acute Rehab PT Goals Patient Stated Goal: "to go back to [facility] and finish before I go home" PT Goal Formulation: With patient Time For Goal Achievement: 06/25/15 Potential to Achieve Goals: Good    Frequency Min 2X/week   Barriers to discharge Decreased caregiver support      Co-evaluation               End of Session Equipment Utilized During Treatment: Gait belt Activity Tolerance: Patient limited by fatigue Patient left: in bed;with call bell/phone within reach;with bed alarm set           Time: 1047-1102 PT Time Calculation (min) (ACUTE ONLY): 15 min   Charges:   PT Evaluation $Initial PT Evaluation Tier I: 1 Procedure     PT G Codes:        Kristen H. Manson Passey, PT, DPT, NCS 06/11/2015, 12:59 PM [phone]

## 2015-06-11 NOTE — Progress Notes (Signed)
[facility] - [place] at [facility]   PATIENT NAME: Diana [name]    MR#:  [id number]  DATE OF BIRTH:  1935-05-06  SUBJECTIVE:says she feels better today.  C/o dry stuffed up nose Dry mouth  REVIEW OF SYSTEMS:   Review of Systems  Constitutional: Positive for malaise/fatigue. Negative for fever, chills and weight loss.  HENT: Negative for ear discharge, ear pain and nosebleeds.   Eyes: Negative for blurred vision, pain and discharge.  Respiratory: Negative for sputum production, shortness of breath, wheezing and stridor.   Cardiovascular: Negative for chest pain, palpitations, orthopnea and PND.  Gastrointestinal: Positive for nausea and abdominal pain. Negative for vomiting and diarrhea.  Genitourinary: Negative for urgency and frequency.  Musculoskeletal: Negative for back pain and joint pain.  Neurological: Positive for weakness. Negative for sensory change, speech change and focal weakness.  Psychiatric/Behavioral: Negative for depression. The patient is not nervous/anxious.   All other systems reviewed and are negative.  Tolerating Diet:npo  DRUG ALLERGIES:   Allergies  Allergen Reactions  . Amoxicillin Other (See Comments)    Reaction:  Stomach cramps   . Cefuroxime Itching  . Contrast Media [Iodinated Diagnostic Agents] Hives  . Cephalexin Rash  . Sulfonamide Derivatives Rash  . Tramadol Hcl Rash  . Venlafaxine Rash    VITALS:  Blood pressure 110/68, pulse 105, temperature 99 F (37.2 C), temperature source Oral, resp. rate 18, height 5\' 3"  (1.6 m), weight 56.246 kg (124 lb), SpO2 95 %.  PHYSICAL EXAMINATION:   Physical Exam  GENERAL:  80 y.o.-year-old patient lying in the bed with no acute distress. Thin,cachectic appear ill EYES: Pupils equal, round, reactive to light and accommodation. No scleral icterus. Extraocular muscles intact.  HEENT: Head atraumatic, normocephalic. Oropharynx and nasopharynx clear. Dry oral mucosa. NECK:   Supple, no jugular venous distention. No thyroid enlargement, no tenderness.  LUNGS: Normal breath sounds bilaterally, no wheezing, rales, rhonchi. No use of accessory muscles of respiration.  CARDIOVASCULAR: S1, S2 normal. No murmurs, rubs, or gallops.  ABDOMEN: Soft, nontender, nondistended. Bowel sounds present. No organomegaly or mass.  EXTREMITIES: No cyanosis, clubbing or edema b/l.    NEUROLOGIC: Cranial nerves II through XII are intact. No focal Motor or sensory deficits b/l.   PSYCHIATRIC: patient is alert and oriented x 3.  SKIN: No obvious rash, lesion, or ulcer.   LABORATORY PANEL:   CBC  Recent Labs Lab 06/09/15 0500 06/10/15 0619  WBC 5.5  --   HGB 10.3* 10.5*  HCT 32.6*  --   PLT 379  --    Chemistries   Recent Labs Lab 06/08/15 1425  06/10/15 0619 06/11/15 0708  NA 151*  < > 153*  --   K 4.1  < > 3.2* 3.3*  CL 117*  < > 120*  --   CO2 25  < > 24  --   GLUCOSE 160*  < > 88  --   BUN 56*  < > 30*  --   CREATININE 1.30*  < > 1.18*  --   CALCIUM 9.1  < > 8.8*  --   MG  --   --  1.9  --   AST 19  --   --   --   ALT 9*  --   --   --   ALKPHOS 89  --   --   --   BILITOT 0.7  --   --   --   < > = values in  this interval not displayed.  Cardiac Enzymes  Recent Labs Lab 06/08/15 1425  TROPONINI 0.03    RADIOLOGY:  No results found.   ASSESSMENT AND PLAN:  80 y.o. female who presents with abdominal pain, which is chronic and related to multiple recent abdominal surgeries came in with dark green/blackish stools  1. GI bleed appears chronic,slow given gradual drifting of hgb. -s/p  EGD yesterday, shows grade d esophagitis.on ppi.,clear liquids. Continue PEG feedings  2. Systemic lupus erythematosus - continue home medications for this-on po prednisone  3.  Diabetes mellitus without complication - sliding scale insulin with appropriate fingerstick glucose checks every 6 hours while nothing by mouth.  4. Hypothyroidism - home dose thyroid  replacement  5. GERD - Protonix  as above  6.DVT, recurrent, lower extremity, chronic - on chronic anticoagulation, INR therapeutic on admission given vitamin K in the ED due to GI bleeding. Hold warfarin for now. Because of severe esophagitis and friable mucosa. Cannot continue Coumadin., Consult vascular for IVC filter placement discussed with the patient.   #7 hypernatremia secondary to dehydration continue free water via PEG, change IV fluids to D5 water at 40 cc an hour. #8 hypokalemia replace the potassium. #9 deconditioning continue physical therapy evaluation.  Case discussed with Care Management/Social Worker. Management plans discussed with the patient, family and they are in agreement.  CODE STATUS:full  DVT Prophylaxis: scd/teds  TOTAL TIME TAKING CARE OF THIS PATIENT: 35 minutes.  >50% time spent on counselling and coordination of care  POSSIBLE D/C IN 2-3 DAYS, DEPENDING ON CLINICAL CONDITION.   Katha Hamming M.D on 06/11/2015 at 7:59 AM  Between 7am to 6pm - Pager - [phone]  After 6pm go to [email or web address] - password [email or web address] [facility]  [place]  [facility]  Office  [phone]  CC: Primary care physician; Sherlene Shams, MD

## 2015-06-11 NOTE — Consult Note (Signed)
Subjective: Patient seen for nausea, and epigastric pain, reflux. Today patient states she continues to have some nausea. Abdominal pain still present although seems less located between PEG site and umbilicus. The lower abdominal discomfort she had yesterday seems to be much lesser resolved. He is been no emesis.  Objective: Vital signs in last 24 hours: Temp:  [97.8 F (36.6 C)-99 F (37.2 C)] 99 F (37.2 C) (09/22 0458) Pulse Rate:  [45-105] 105 (09/22 0458) Resp:  [17-24] 18 (09/22 0458) BP: (86-123)/(54-81) 110/68 mmHg (09/22 0458) SpO2:  [90 %-96 %] 95 % (09/22 0458) Weight:  [56.246 kg (124 lb)] 56.246 kg (124 lb) (09/21 1413) Blood pressure 110/68, pulse 105, temperature 99 F (37.2 C), temperature source Oral, resp. rate 18, height 5\' 3"  (1.6 m), weight 56.246 kg (124 lb), SpO2 95 %.   Intake/Output from previous day: 09/21 0701 - 09/22 0700 In: 2651.5 [I.V.:2099.5; NG/GT:150] Out: 550 [Urine:550]  Intake/Output this shift: Total I/O In: 360 [P.O.:360] Out: -    General appearance:  Elderly-appearing 80 year old female no acute distress Resp:  Clear to auscultation Cardio:  Regular rate and rhythm GI:  Soft nondistended mild tenderness below PEG site is noted above. No masses or rebound. Extremities:  No clubbing cyanosis or edema   Lab Results: Results for orders placed or performed during the hospital encounter of 06/08/15 (from the past 24 hour(s))  Glucose, capillary     Status: None   Collection Time: 06/10/15 11:08 AM  Result Value Ref Range   Glucose-Capillary 84 65 - 99 mg/dL  Glucose, capillary     Status: None   Collection Time: 06/10/15  4:03 PM  Result Value Ref Range   Glucose-Capillary 79 65 - 99 mg/dL  Glucose, capillary     Status: None   Collection Time: 06/10/15  9:37 PM  Result Value Ref Range   Glucose-Capillary 75 65 - 99 mg/dL   Comment 1 Notify RN   Glucose, capillary     Status: Abnormal   Collection Time: 06/11/15  7:02 AM  Result  Value Ref Range   Glucose-Capillary 110 (H) 65 - 99 mg/dL  Potassium     Status: Abnormal   Collection Time: 06/11/15  7:08 AM  Result Value Ref Range   Potassium 3.3 (L) 3.5 - 5.1 mmol/L      Recent Labs  06/08/15 1425 06/09/15 0500 06/10/15 0619  WBC 8.2 5.5  --   HGB 11.6* 10.3* 10.5*  HCT 37.4 32.6*  --   PLT 466* 379  --    BMET  Recent Labs  06/08/15 1425 06/09/15 0500 06/10/15 0619 06/11/15 0708  NA 151* 153* 153*  --   K 4.1 3.6 3.2* 3.3*  CL 117* 120* 120*  --   CO2 25 24 24   --   GLUCOSE 160* 126* 88  --   BUN 56* 46* 30*  --   CREATININE 1.30* 1.16* 1.18*  --   CALCIUM 9.1 8.6* 8.8*  --    LFT  Recent Labs  06/08/15 1425  PROT 5.9*  ALBUMIN 2.3*  AST 19  ALT 9*  ALKPHOS 89  BILITOT 0.7   PT/INR  Recent Labs  06/09/15 0500 06/10/15 0619  LABPROT 19.4* 15.6*  INR 1.62 1.22   Hepatitis Panel No results for input(s): HEPBSAG, HCVAB, HEPAIGM, HEPBIGM in the last 72 hours. C-Diff No results for input(s): CDIFFTOX in the last 72 hours. No results for input(s): CDIFFPCR in the last 72 hours.   Studies/Results: No  results found.  Scheduled Inpatient Medications:   . antiseptic oral rinse  7 mL Mouth Rinse BID  . ciprofloxacin  500 mg Oral BID  . diclofenac  1 patch Transdermal BID  . feeding supplement  1 Container Oral TID WC  . free water  150 mL Per Tube BID  . hydroxychloroquine  200 mg Oral Daily  . insulin aspart  0-15 Units Subcutaneous TID WC  . letrozole  2.5 mg Oral Daily  . levothyroxine  150 mcg Oral QAC breakfast  . mometasone-formoterol  2 puff Inhalation BID  .  morphine injection  2 mg Intravenous Once  . mycophenolate  1,000 mg Oral BID  . [START ON 06/12/2015] pantoprazole (PROTONIX) IV  40 mg Intravenous Q12H  . potassium chloride  10 mEq Intravenous Q1 Hr x 4  . predniSONE  1 mg Oral Daily  . predniSONE  5 mg Oral Daily  . sodium chloride  3 mL Intravenous Q12H  . Tadalafil (PAH)  40 mg Oral Daily     Continuous Inpatient Infusions:   . dextrose 50 mL/hr at 06/11/15 0922  . feeding supplement (JEVITY 1.5 CAL/FIBER)    . free water      PRN Inpatient Medications:  acetaminophen **OR** acetaminophen, ALPRAZolam, morphine injection, ondansetron **OR** ondansetron (ZOFRAN) IV, sodium chloride  Miscellaneous:   Assessment:  1. Recurrent esophagitis with easy friability of the distal esophagus. Please note findings on EGD done yesterday. 2. Anemia, recurrent Plan:  1. Extensive discussion done today with dietary as well as patient in regards to recommendations. Head of the bed needs to be at least 30 at all times. He tends to slide down and when she does chest cavity is flat to the bed only encourages reflux and worsens the problem with the esophagus.*For nutrition will need to be through the PEG at this point. He might be some clear liquids that could be used for pleasure feeds. She does complain of a sensation in the stomach. Currently I don't know that this would be surprising in regards to the anatomic considerations as well as gastropexy at 2 sites. Avoid solid foods orally for now. Recommend repeat EGD in about 3 weeks or so with GI follow-up in 10-14 days Arrange this. We will need to be off her anticoagulation medications for this repeat scope. Oral and esophageal Candidiasis Noted. Please See ENT Recommendations As Well.  Lollie Sails MD 06/11/2015, 11:07 AM

## 2015-06-11 NOTE — Progress Notes (Signed)
MEDICATION RELATED CONSULT NOTE - INITIAL   Pharmacy Consult for electrolyte management Indication: hypokalemia  Allergies  Allergen Reactions  . Amoxicillin Other (See Comments)    Reaction:  Stomach cramps   . Cefuroxime Itching  . Contrast Media [Iodinated Diagnostic Agents] Hives  . Cephalexin Rash  . Sulfonamide Derivatives Rash  . Tramadol Hcl Rash  . Venlafaxine Rash    Patient Measurements: Height: 5\' 3"  (160 cm) Weight: 124 lb (56.246 kg) IBW/kg (Calculated) : 52.4  Vital Signs: Temp: 98.2 F (36.8 C) (09/22 1532) Temp Source: Oral (09/22 1532) BP: 117/54 mmHg (09/22 1532) Pulse Rate: 99 (09/22 1532) Intake/Output from previous day: 09/21 0701 - 09/22 0700 In: 2651.5 [I.V.:2099.5; NG/GT:150] Out: 550 [Urine:550]  Labs:  Recent Labs  06/09/15 0500 06/10/15 0619  WBC 5.5  --   HGB 10.3* 10.5*  HCT 32.6*  --   PLT 379  --   CREATININE 1.16* 1.18*  MG  --  1.9   Estimated Creatinine Clearance: 32.5 mL/min (by C-G formula based on Cr of 1.18).  Microbiology: Recent Results (from the past 720 hour(s))  Urine culture     Status: None   Collection Time: 05/29/15  5:30 PM  Result Value Ref Range Status   Specimen Description URINE, RANDOM  Final   Special Requests NONE  Final   Culture INSIGNIFICANT GROWTH  Final   Report Status 05/31/2015 FINAL  Final  Urine culture     Status: None   Collection Time: 06/01/15  9:10 AM  Result Value Ref Range Status   Specimen Description URINE, RANDOM  Final   Special Requests NONE  Final   Culture MULTIPLE SPECIES PRESENT, SUGGEST RECOLLECTION  Final   Report Status 06/03/2015 FINAL  Final  Wound culture     Status: None (Preliminary result)   Collection Time: 06/08/15 11:30 AM  Result Value Ref Range Status   Specimen Description PEG SITE  Final   Special Requests NONE  Final   Gram Stain   Final    RARE WBC SEEN RARE GRAM POSITIVE COCCI FEW GRAM NEGATIVE RODS    Culture   Final    HEAVY GROWTH KLEBSIELLA  PNEUMONIAE HEAVY GROWTH GRAM NEGATIVE RODS HEAVY GROWTH UNIDENTIFIED ORGANISM MODERATE GROWTH YEAST IDENTIFICATION AND SUSCEPTIBILITIES TO FOLLOW    Report Status PENDING  Incomplete   Organism ID, Bacteria KLEBSIELLA PNEUMONIAE  Final   Organism ID, Bacteria GRAM NEGATIVE RODS  Final      Susceptibility   Klebsiella pneumoniae - MIC*    AMPICILLIN >=32 RESISTANT Resistant     CEFTAZIDIME <=1 SENSITIVE Sensitive     CEFAZOLIN <=4 SENSITIVE Sensitive     CEFTRIAXONE <=1 SENSITIVE Sensitive     CIPROFLOXACIN <=0.25 SENSITIVE Sensitive     GENTAMICIN <=1 SENSITIVE Sensitive     IMIPENEM <=0.25 SENSITIVE Sensitive     TRIMETH/SULFA <=20 SENSITIVE Sensitive     PIP/TAZO Value in next row Sensitive      SENSITIVE<=4    * HEAVY GROWTH KLEBSIELLA PNEUMONIAE  MRSA PCR Screening     Status: None   Collection Time: 06/09/15  2:27 AM  Result Value Ref Range Status   MRSA by PCR NEGATIVE NEGATIVE Final    Comment:        The GeneXpert MRSA Assay (FDA approved for NASAL specimens only), is one component of a comprehensive MRSA colonization surveillance program. It is not intended to diagnose MRSA infection nor to guide or monitor treatment for MRSA infections.  Medical History: Past Medical History  Diagnosis Date  . Lupus (systemic lupus erythematosus)   . Pulmonary hypertension   . Pneumonia   . Unspecified menopausal and postmenopausal disorder   . Primary pulmonary hypertension   . Acute glomerulonephritis with other specified pathological lesion in kidney in disease classified elsewhere(580.81)   . Unspecified hypothyroidism   . Shingles (herpes zoster) polyneuropathy March 2013    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  . Hiatal hernia   . DVT (deep venous thrombosis)   . Cancer     Breast  . CHF (congestive heart failure)   . Renal insufficiency     Medications:  Scheduled:  . antiseptic oral rinse  7 mL Mouth Rinse BID  . ciprofloxacin  500 mg Oral BID  .  diclofenac  1 patch Transdermal BID  . feeding supplement  1 Container Oral TID WC  . fluconazole  100 mg Per Tube Daily  . free water  150 mL Per Tube BID  . hydroxychloroquine  200 mg Oral Daily  . insulin aspart  0-15 Units Subcutaneous TID WC  . letrozole  2.5 mg Oral Daily  . levothyroxine  150 mcg Oral QAC breakfast  . mometasone-formoterol  2 puff Inhalation BID  .  morphine injection  2 mg Intravenous Once  . mycophenolate  1,000 mg Oral BID  . [START ON 06/12/2015] pantoprazole (PROTONIX) IV  40 mg Intravenous Q12H  . predniSONE  1 mg Oral Daily  . predniSONE  5 mg Oral Daily  . sodium chloride  3 mL Intravenous Q12H  . Tadalafil (PAH)  40 mg Oral Daily   Infusions:  . dextrose 50 mL/hr at 06/11/15 0922  . feeding supplement (JEVITY 1.5 CAL/FIBER) 1,000 mL (06/11/15 1500)  . free water      Assessment: PD is a 80 yo female admitted for epigastric pain. Pharmacy is consulted to manage electrolytes in this patient.  Plan:  9/21 K+ 3.2, Mag 1.9 9/22 AM K+ 3.3 9/22 PM K+ 3.9  Potassium WNL.  Will recheck with AM labs.   Pharmacy will continue to monitor.  Krista Blue, PharmD Clinical Pharmacist 06/11/2015,6:23 PM

## 2015-06-11 NOTE — Progress Notes (Signed)
Per MD patient will likely be ready for D/C tomorrow. Plan is for patient to return to [facility] for short term rehab. Kim admissions coordinator at [facility] is aware of above. CSW asked MD to complete D/C Summary Friday 06/12/15 if patient will be ready for D/C over the weekend. CSW will continue to follow and assist as needed.   Blima Rich, [place] [phone]

## 2015-06-11 NOTE — Consult Note (Signed)
Diana [name], Diana [name] [id number] 1935-03-30 Katha Hamming, MD  Reason for Consult: Abnormal lesion in oral cavity  HPI: 80 y.o. Female admitted for rectal bleeding who underwent an EGD and was found to have an abnormality on EGD of her palate.  Consulted for evaluation.  Patient reports whitish discharge from oral cavity and dysphagia.  Denies an current breathing difficulty.  Burning in mouth as well.  Allergies:  Allergies  Allergen Reactions  . Amoxicillin Other (See Comments)    Reaction:  Stomach cramps   . Cefuroxime Itching  . Contrast Media [Iodinated Diagnostic Agents] Hives  . Cephalexin Rash  . Sulfonamide Derivatives Rash  . Tramadol Hcl Rash  . Venlafaxine Rash    ROS: Review of systems normal other than 12 systems except per HPI.  PMH:  Past Medical History  Diagnosis Date  . Lupus (systemic lupus erythematosus)   . Pulmonary hypertension   . Pneumonia   . Unspecified menopausal and postmenopausal disorder   . Primary pulmonary hypertension   . Acute glomerulonephritis with other specified pathological lesion in kidney in disease classified elsewhere(580.81)   . Unspecified hypothyroidism   . Shingles (herpes zoster) polyneuropathy March 2013    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  . Hiatal hernia   . DVT (deep venous thrombosis)   . Cancer     Breast  . CHF (congestive heart failure)   . Renal insufficiency     FH:  Family History  Problem Relation Age of Onset  . Colon cancer Mother   . Alcohol abuse Sister   . Breast cancer Sister   . Cancer Brother   . Lung cancer Son     SH:  Social History   Social History  . Marital Status: Divorced    Spouse Name: N/A  . Number of Children: N/A  . Years of Education: N/A   Occupational History  . 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 Smoking status: Former Smoker    Quit date: 05/25/1991  . Smokeless tobacco: Never Used  . Alcohol Use: Yes     Comment:  rare  . Drug Use: No  . Sexual Activity: Not on file   Other Topics Concern  . Not on file   Social History Narrative    PSH:  Past Surgical History  Procedure Laterality Date  . Kyphoplasty N/A 02/10/2015    Procedure: [id number];  Surgeon: Kennedy Bucker, MD;  Location: [facility] ORS;  Service: Orthopedics;  Laterality: N/A;  . Laparotomy N/A 03/11/2015    Procedure: REDUCTION OF GASTRIC VOLVULUS, SPLEENECTOMY, GASTRIC TUBE PLACEMENT;  Surgeon: Earline Mayotte, MD;  Location: [facility] ORS;  Service: General;  Laterality: N/A;  . Esophagogastroduodenoscopy (egd) with propofol N/A 03/28/2015    Procedure: ESOPHAGOGASTRODUODENOSCOPY (EGD) WITH PROPOFOL;  Surgeon: Earline Mayotte, MD;  Location: [facility];  Service: Endoscopy;  Laterality: N/A;    Physical  Exam:   GEN-  CN 2-12 grossly intact and symmetric. EARS-EAC/TMs normal BL.  OC/OP- bone lesion at hard palate consistent with maxillary tori, severe whitish exudate throughout oral cavity and oropharynx consistent with thrush Ext- Skin warm and dry. NOSE- Nasal cavity without polyps or purulence. External nose EYE- EOMI, PERRLA.  NECK- Neck supple with no masses or lesions. No lymphadenopathy palpated. Thyroid normal with no masses.   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  No worrisome findings on exam of oral cavity.-  Follow up as  outpatient for repeat evaluation and possible laryngoscopy at that time but no lesion that would require intervention as inpatient  2)  Severe thrush of oral cavity.  Defer to Medicine regarding treatment but most likely will need combination or Diflucan, and Mycelex or Nystatin mouthwash   Diana [name] 06/11/2015 9:49 AM

## 2015-06-12 ENCOUNTER — Encounter
Admission: EM | Disposition: A | Discharge status: Skilled Nursing Facility | Payer: Self-pay | Source: Home / Self Care | Attending: Internal Medicine

## 2015-06-12 HISTORY — PX: PERIPHERAL VASCULAR CATHETERIZATION: SHX172C

## 2015-06-12 LAB — BASIC METABOLIC PANEL
Anion gap: 5 (ref 5–15)
BUN: 12 mg/dL (ref 6–20)
CALCIUM: 8.2 mg/dL — AB (ref 8.9–10.3)
CO2: 24 mmol/L (ref 22–32)
CREATININE: 1 mg/dL (ref 0.44–1.00)
Chloride: 110 mmol/L (ref 101–111)
GFR calc non Af Amer: 53 mL/min — ABNORMAL LOW (ref >60)
Glucose, Bld: 190 mg/dL — ABNORMAL HIGH (ref 65–99)
Potassium: 3.6 mmol/L (ref 3.5–5.1)
SODIUM: 139 mmol/L (ref 135–145)

## 2015-06-12 LAB — GLUCOSE, CAPILLARY
GLUCOSE-CAPILLARY: 140 mg/dL — AB (ref 65–99)
GLUCOSE-CAPILLARY: 92 mg/dL (ref 65–99)
Glucose-Capillary: 140 mg/dL — ABNORMAL HIGH (ref 65–99)
Glucose-Capillary: 226 mg/dL — ABNORMAL HIGH (ref 65–99)

## 2015-06-12 SURGERY — IVC FILTER INSERTION
Anesthesia: Moderate Sedation

## 2015-06-12 MED ORDER — SODIUM CHLORIDE 0.9 % IV SOLN: INTRAVENOUS | Status: DC

## 2015-06-12 MED ORDER — JEVITY 1.5 CAL/FIBER PO LIQD
1000.0000 mL | ORAL | Status: DC
  Administered 2015-06-12: 1000 mL

## 2015-06-12 MED ORDER — FREE WATER: 150.0000 mL | Freq: Two times a day (BID) | Status: DC

## 2015-06-12 MED ORDER — FLUCONAZOLE 100 MG PO TABS: 100.0000 mg | ORAL_TABLET | Freq: Every day | ORAL | Status: AC

## 2015-06-12 MED ORDER — NYSTATIN 100000 UNIT/ML MT SUSP: 5.0000 mL | Freq: Four times a day (QID) | OROMUCOSAL | Status: DC

## 2015-06-12 MED ORDER — CIPROFLOXACIN HCL 250 MG PO TABS
500.0000 mg | ORAL_TABLET | Freq: Two times a day (BID) | ORAL | Status: DC
  Administered 2015-06-12 – 2015-06-13 (×2): 500 mg via ORAL
  Filled 2015-06-12 (×2): qty 2

## 2015-06-12 MED ORDER — METHYLPREDNISOLONE SODIUM SUCC 125 MG IJ SOLR: 125.0000 mg | Freq: Once | INTRAMUSCULAR | Status: DC

## 2015-06-12 MED ORDER — CEFAZOLIN SODIUM 1-5 GM-% IV SOLN
INTRAVENOUS | Status: AC
  Filled 2015-06-12: qty 50

## 2015-06-12 MED ORDER — OXYCODONE-ACETAMINOPHEN 5-325 MG PO TABS: 1.0000 | ORAL_TABLET | Freq: Four times a day (QID) | ORAL | Status: DC | PRN

## 2015-06-12 MED ORDER — IOHEXOL 300 MG/ML  SOLN
INTRAMUSCULAR | Status: DC | PRN
Start: 2015-06-12 — End: 2015-06-12
  Administered 2015-06-12: 15 mL via INTRAVENOUS

## 2015-06-12 MED ORDER — FAMOTIDINE 20 MG PO TABS: 20.0000 mg | ORAL_TABLET | Freq: Once | ORAL | Status: DC

## 2015-06-12 MED ORDER — ALPRAZOLAM 0.25 MG PO TABS: 0.2500 mg | ORAL_TABLET | Freq: Every evening | ORAL | Status: DC | PRN

## 2015-06-12 MED ORDER — FREE WATER
150.0000 mL | Freq: Two times a day (BID) | Status: DC
  Administered 2015-06-12 – 2015-06-13 (×2): 150 mL

## 2015-06-12 MED ORDER — JEVITY 1.5 CAL/FIBER PO LIQD: 1000.0000 mL | ORAL | Status: DC

## 2015-06-12 MED ORDER — FAMOTIDINE 20 MG PO TABS
ORAL_TABLET | ORAL | Status: AC
  Administered 2015-06-12: 14:00:00
  Filled 2015-06-12: qty 1

## 2015-06-12 MED ORDER — MIDAZOLAM HCL 5 MG/5ML IJ SOLN
INTRAMUSCULAR | Status: AC
  Filled 2015-06-12: qty 5

## 2015-06-12 MED ORDER — MIDAZOLAM HCL 2 MG/2ML IJ SOLN
INTRAMUSCULAR | Status: DC | PRN
Start: 2015-06-12 — End: 2015-06-12
  Administered 2015-06-12: 1 mg via INTRAVENOUS
  Administered 2015-06-12: 2 mg via INTRAVENOUS

## 2015-06-12 MED ORDER — JEVITY 1.5 CAL/FIBER PO LIQD
1000.0000 mL | ORAL | Status: DC
Start: 2015-06-12 — End: 2015-06-12
  Administered 2015-06-12: 1000 mL

## 2015-06-12 MED ORDER — CEFAZOLIN SODIUM 1-5 GM-% IV SOLN: 1.0000 g | Freq: Once | INTRAVENOUS | Status: DC

## 2015-06-12 MED ORDER — CHLORHEXIDINE GLUCONATE CLOTH 2 % EX PADS
6.0000 | MEDICATED_PAD | Freq: Once | CUTANEOUS | Status: AC
  Administered 2015-06-12: 15:00:00 6 via TOPICAL

## 2015-06-12 MED ORDER — METHYLPREDNISOLONE SODIUM SUCC 125 MG IJ SOLR
INTRAMUSCULAR | Status: AC
  Administered 2015-06-12: 14:00:00
  Filled 2015-06-12: qty 2

## 2015-06-12 MED ORDER — FREE WATER
25.0000 mL | Status: DC
Start: 2015-06-12 — End: 2015-06-13
  Administered 2015-06-12: 16:00:00 25 mL

## 2015-06-12 MED ORDER — NYSTATIN 100000 UNIT/ML MT SUSP
5.0000 mL | Freq: Four times a day (QID) | OROMUCOSAL | Status: DC
  Administered 2015-06-12 – 2015-06-13 (×2): 500000 [IU] via ORAL
  Filled 2015-06-12 (×2): qty 5

## 2015-06-12 SURGICAL SUPPLY — 3 items
FILTER VC CELECT-FEMORAL (Filter) ×3 IMPLANT
PACK ANGIOGRAPHY (CUSTOM PROCEDURE TRAY) ×3 IMPLANT
WIRE J 3MM .035X145CM (WIRE) ×3 IMPLANT

## 2015-06-12 NOTE — Op Note (Signed)
[facility]   OPERATIVE NOTE    PRE-OPERATIVE DIAGNOSIS: DVT with GI bleed requiring cessation of anticoagulation  POST-OPERATIVE DIAGNOSIS: same  PROCEDURE: 1.   Ultrasound guidance for vascular access to the left femoral vein 2.   Catheter placement into the inferior vena cava 3.   Inferior venacavogram 4.   Placement of a cook Celect IVC filter  SURGEON: Leotis Pain, MD  ASSISTANT(S): None  ANESTHESIA: local/sedation  ESTIMATED BLOOD LOSS: minimal  FINDING(S): 1.  Patent IVC  SPECIMEN(S):  none  INDICATIONS:   Diana [name] is a 80 y.o. female who presents with RLE DVT and GI bleed requiring cessation of anticoagualation.  Inferior vena cava filter is indicated for this reason.  Risks and benefits including filter thrombosis, migration, fracture, bleeding, and infection were all discussed.  We discussed that all IVC filters that we place can be removed if desired from the patient once the need for the filter has passed.    DESCRIPTION: After obtaining full informed written consent, the patient was brought back to the vascular suite. The skin was sterilely prepped and draped in a sterile surgical field was created. The left femoral vein was accessed under direct ultrasound guidance without difficulty with a Seldinger needle and a J-wire was then placed. After skin nick and dilatation, the delivery sheath was placed into the inferior vena cava and an inferior venacavogram was performed. This demonstrated a patent IVC with the level of the renal veins at L1.  The filter was then deployed into the inferior vena cava at the level of L2 just below the renal veins. The delivery sheath was then removed. Pressure was held. Sterile dressings were placed. The patient tolerated the procedure well and was taken to the recovery room in stable condition.  COMPLICATIONS: None  CONDITION: Stable  Diana [name]  06/12/2015, 2:02 PM

## 2015-06-12 NOTE — Consult Note (Signed)
Subjective: Patient seen for epigastric abdominal pain and nausea. Patient feeling some better today. There is less abdominal pain and she has had no nausea. She is tolerating tube feeds. Complaint of cough with ringing up a lot of mucus.  Objective: Vital signs in last 24 hours: Temp:  [97.8 F (36.6 C)-98.9 F (37.2 C)] 97.9 F (36.6 C) (09/23 2127) Pulse Rate:  [92-103] 100 (09/23 2127) Resp:  [16-30] 18 (09/23 2127) BP: (97-116)/(54-73) 97/58 mmHg (09/23 2127) SpO2:  [92 %-99 %] 94 % (09/23 2127) Blood pressure 97/58, pulse 100, temperature 97.9 F (36.6 C), temperature source Oral, resp. rate 18, height 5\' 3"  (1.6 m), weight 56.246 kg (124 lb), SpO2 94 %.   Intake/Output from previous day: 09/22 0701 - 09/23 0700 In: 2269.8 [P.O.:360; I.V.:1050.8; NG/GT:325] Out: -   Intake/Output this shift: Total I/O In: 150 [NG/GT:150] Out: -    General appearance:  80 year old female no acute distress Resp:  Clear to auscultation Cardio:  Regular rate and rhythm GI:  Soft mild discomfort palpation across lower abdomen improved from previous masses rebound or organomegaly. Extremities:  No clubbing cyanosis or edema   Lab Results: Results for orders placed or performed during the hospital encounter of 06/08/15 (from the past 24 hour(s))  Basic metabolic panel     Status: Abnormal   Collection Time: 06/12/15  5:13 AM  Result Value Ref Range   Sodium 139 135 - 145 mmol/L   Potassium 3.6 3.5 - 5.1 mmol/L   Chloride 110 101 - 111 mmol/L   CO2 24 22 - 32 mmol/L   Glucose, Bld 190 (H) 65 - 99 mg/dL   BUN 12 6 - 20 mg/dL   Creatinine, Ser 1.00 0.44 - 1.00 mg/dL   Calcium 8.2 (L) 8.9 - 10.3 mg/dL   GFR calc non Af Amer 53 (L) >60 mL/min   GFR calc Af Amer >60 >60 mL/min   Anion gap 5 5 - 15  Glucose, capillary     Status: Abnormal   Collection Time: 06/12/15  7:41 AM  Result Value Ref Range   Glucose-Capillary 140 (H) 65 - 99 mg/dL  Glucose, capillary     Status: None   Collection Time: 06/12/15 11:01 AM  Result Value Ref Range   Glucose-Capillary 92 65 - 99 mg/dL  Glucose, capillary     Status: Abnormal   Collection Time: 06/12/15  4:41 PM  Result Value Ref Range   Glucose-Capillary 140 (H) 65 - 99 mg/dL  Glucose, capillary     Status: Abnormal   Collection Time: 06/12/15  9:29 PM  Result Value Ref Range   Glucose-Capillary 226 (H) 65 - 99 mg/dL      Recent Labs  06/10/15 0619  HGB 10.5*   BMET  Recent Labs  06/10/15 0619 06/11/15 0708 06/11/15 1725 06/12/15 0513  NA 153*  --   --  139  K 3.2* 3.3* 3.9 3.6  CL 120*  --   --  110  CO2 24  --   --  24  GLUCOSE 88  --   --  190*  BUN 30*  --   --  12  CREATININE 1.18*  --   --  1.00  CALCIUM 8.8*  --   --  8.2*   LFT No results for input(s): PROT, ALBUMIN, AST, ALT, ALKPHOS, BILITOT, BILIDIR, IBILI in the last 72 hours. PT/INR  Recent Labs  06/10/15 0619  LABPROT 15.6*  INR 1.22   Hepatitis Panel No results  for input(s): HEPBSAG, HCVAB, HEPAIGM, HEPBIGM in the last 72 hours. C-Diff No results for input(s): CDIFFTOX in the last 72 hours. No results for input(s): CDIFFPCR in the last 72 hours.   Studies/Results: US Venous Img Lower Bilateral  06/11/2015   CLINICAL DATA:  History of deep venous thrombosis with recent GI bleed in need for discontinuing anti coagulation  EXAM: BILATERAL LOWER EXTREMITY VENOUS DOPPLER ULTRASOUND  TECHNIQUE: Gray-scale sonography with graded compression, as well as color Doppler and duplex ultrasound were performed to evaluate the lower extremity deep venous systems from the level of the common femoral vein and including the common femoral, femoral, profunda femoral, popliteal and calf veins including the posterior tibial, peroneal and gastrocnemius veins when visible. The superficial great saphenous vein was also interrogated. Spectral Doppler was utilized to evaluate flow at rest and with distal augmentation maneuvers in the common femoral, femoral  and popliteal veins.  COMPARISON:  03/31/2015  FINDINGS: RIGHT LOWER EXTREMITY  Common Femoral Vein: There is thrombus extending into the common femoral vein from the profunda femoral vein. Decreased compressibility is noted.  Saphenofemoral Junction: No evidence of thrombus. Normal compressibility and flow on color Doppler imaging.  Profunda Femoral Vein: Thrombus is noted within the profunda femoral vein extending across the junction of the profunda and superficial femoral vein. Decreased compressibility is noted.  Femoral Vein: No evidence of thrombus. Normal compressibility, respiratory phasicity and response to augmentation.  Popliteal Vein: No evidence of thrombus. Normal compressibility, respiratory phasicity and response to augmentation.  Calf Veins: No evidence of thrombus. Normal compressibility and flow on color Doppler imaging.  Superficial Great Saphenous Vein: No evidence of thrombus. Normal compressibility and flow on color Doppler imaging.  Venous Reflux:  None.  Other Findings:  None.  LEFT LOWER EXTREMITY  Common Femoral Vein: No evidence of thrombus. Normal compressibility, respiratory phasicity and response to augmentation.  Saphenofemoral Junction: No evidence of thrombus. Normal compressibility and flow on color Doppler imaging.  Profunda Femoral Vein: No evidence of thrombus. Normal compressibility and flow on color Doppler imaging.  Femoral Vein: No evidence of thrombus. Normal compressibility, respiratory phasicity and response to augmentation.  Popliteal Vein: No evidence of thrombus. Normal compressibility, respiratory phasicity and response to augmentation.  Calf Veins: No evidence of thrombus. Normal compressibility and flow on color Doppler imaging.  Superficial Great Saphenous Vein: No evidence of thrombus. Normal compressibility and flow on color Doppler imaging.  Venous Reflux:  None.  Other Findings:  None.  IMPRESSION: No deep venous thrombosis is noted on the left.  On the right,  there is evidence of thrombus extending from the profunda femoral vein into the common femoral vein.  These results will be called to the ordering clinician or representative by the Radiologist Assistant, and communication documented in the PACS or zVision Dashboard.   Electronically Signed   By: Inez Catalina M.D.   On: 06/11/2015 15:45    Scheduled Inpatient Medications:   . antiseptic oral rinse  7 mL Mouth Rinse BID  . ceFAZolin      . ceFAZolin      . ciprofloxacin  500 mg Oral BID  . diclofenac  1 patch Transdermal BID  . feeding supplement  1 Container Oral TID WC  . fluconazole  100 mg Per Tube Daily  . free water  150 mL Per Tube BID  . hydroxychloroquine  200 mg Oral Daily  . insulin aspart  0-15 Units Subcutaneous TID WC  . letrozole  2.5 mg Oral Daily  .  levothyroxine  150 mcg Oral QAC breakfast  . mometasone-formoterol  2 puff Inhalation BID  . mycophenolate  1,000 mg Oral BID  . nystatin  5 mL Oral QID  . pantoprazole (PROTONIX) IV  40 mg Intravenous Q12H  . predniSONE  1 mg Oral Daily  . predniSONE  5 mg Oral Daily  . sodium chloride  3 mL Intravenous Q12H  . Tadalafil (PAH)  40 mg Oral Daily    Continuous Inpatient Infusions:   . sodium chloride Stopped (06/12/15 1700)  . feeding supplement (JEVITY 1.5 CAL/FIBER) 1,000 mL (06/12/15 1700)  . free water      PRN Inpatient Medications:  acetaminophen **OR** acetaminophen, ALPRAZolam, morphine injection, ondansetron **OR** ondansetron (ZOFRAN) IV, sodium chloride  Miscellaneous:   Assessment:  1. Severe esophagitis likely related with reflux. His is complicated by recurrent candida esophagitis. 2. 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 Please see recent EGD  Plan:  1. Continue current recommendations as outlined in previous note. She will need to be seen in the GI outpatient clinic in about 2-3 weeks and in repeat EGD done in about 3 weeks or so. Continue twice a day PPI and tube feeds  as arranged. Minimal oral liquids only. Head of bed must be elevated at all times to at least 30-35.  Lollie Sails MD 06/12/2015, 10:23 PM

## 2015-06-12 NOTE — Progress Notes (Signed)
Nutrition Follow-up   INTERVENTION:   EN: Recommend titrating to goal rate of 69mL/hr of Jevity 1.5 as pt tolerated 55mL/hr for 18 hours per RN Daleen Snook this am before cutting it off at 9:00am.  Will also discontinue free water flushes of 140mL BID as pt remains on D5 at 71mL/hr and Na is WDL as TF is being increased to goal.  Coordination of Care: Spoke with Terri Skains, RD at [facility] this morning and relayed nutritional poc per MD, Skulski's request.  RD made aware that nutrition should solely come from TF and that pleasure feeds of CL was acceptable. RD aware of recommendations for goal rate of 61mL/hr of Jevity 1.5 at this time and will monitor tolerance and adjust as able to optimize nutritional intake. Also stressed importance of HOB>30 and that 18 hour feeds were recommended per MD. RD at [place] appreciative of coordination of care.    NUTRITION DIAGNOSIS:   Inadequate oral intake related to altered GI function as evidenced by per patient/family report.  GOAL:   Patient will meet greater than or equal to 90% of their needs  MONITOR:    (Energy Intake, Anthropometrics, Digestive system, Electrolyte and Renal Profile)   ASSESSMENT:   Pt now with DVT, per MD note plan to have IVC filter placed this afternoon. Possible discharge over the weekend.    Diet Order:  Diet NPO time specified    Current Nutrition: Pt tolerated Jevity 1.5 at 51mL/hr for 18hours per RN Daleen Snook this am, with minimal to no residuals.    Gastrointestinal Profile: Last BM: 2 stools documented yesterday   Medications: D5 at 61mL/hr (providing 204kcals in 24 hours), Prednisone, Cipro  Electrolyte/Renal Profile and Glucose Profile:   Recent Labs Lab 06/09/15 0500 06/10/15 0619 06/11/15 0708 06/11/15 1725 06/12/15 0513  NA 153* 153*  --   --  139  K 3.6 3.2* 3.3* 3.9 3.6  CL 120* 120*  --   --  110  CO2 24 24  --   --  24  BUN 46* 30*  --   --  12  CREATININE 1.16*  1.18*  --   --  1.00  CALCIUM 8.6* 8.8*  --   --  8.2*  MG  --  1.9  --   --   --   GLUCOSE 126* 88  --   --  190*   Protein Profile:  Recent Labs Lab 06/08/15 0425 06/08/15 1425  ALBUMIN 1.9* 2.3*     Weight Trend since Admission: Filed Weights   06/08/15 1405 06/09/15 0145 06/10/15 1413  Weight: 124 lb (56.246 kg) 124 lb 9.6 oz (56.518 kg) 124 lb (56.246 kg)     BMI:  Body mass index is 21.97 kg/(m^2).  Estimated Nutritional Needs:   Kcal:  BEE: 1014kcals, TEE: (IF 1.2-1.4)(AF 1.2) 1460-1703kcals  Protein:  57-68g protein (1.0-1.2g/kg)   Fluid:  1413-1622mL of fluid (25-56mL/kg)   EDUCATION NEEDS:   No education needs identified at this time   [place], RD, LDN Pager [phone]

## 2015-06-12 NOTE — Progress Notes (Addendum)
[facility] [place] at [facility]   PATIENT NAME: Diana [name]    MR#:  [id number]  DATE OF BIRTH:  1934-12-04  SUBJECTIVE:says she feels better today.  Wants to get back to [facility] as she couldn't get any sleep at all here. - RLE acute DVT- cannot take anticoagulation due to severe esophageal and gastric inflammation- recommending IVC filter- patient wants to think about it.  REVIEW OF SYSTEMS:   Review of Systems  Constitutional: Positive for malaise/fatigue. Negative for fever, chills and weight loss.  HENT: Negative for ear discharge, ear pain and nosebleeds.   Eyes: Negative for blurred vision, pain and discharge.  Respiratory: Negative for sputum production, shortness of breath, wheezing and stridor.   Cardiovascular: Negative for chest pain, palpitations, orthopnea and PND.  Gastrointestinal: Positive for nausea. Negative for vomiting, abdominal pain and diarrhea.  Genitourinary: Negative for urgency and frequency.  Musculoskeletal: Negative for back pain and joint pain.  Neurological: Positive for weakness. Negative for sensory change, speech change and focal weakness.  Psychiatric/Behavioral: Negative for depression. The patient is not nervous/anxious.   All other systems reviewed and are negative.  Tolerating Diet: Tube feeds and clear liquid diet  DRUG ALLERGIES:   Allergies  Allergen Reactions  . Amoxicillin Other (See Comments)    Reaction:  Stomach cramps   . Cefuroxime Itching  . Contrast Media [Iodinated Diagnostic Agents] Hives  . Cephalexin Rash  . Sulfonamide Derivatives Rash  . Tramadol Hcl Rash  . Venlafaxine Rash    VITALS:  Blood pressure 100/59, pulse 92, temperature 98.3 F (36.8 C), temperature source Oral, resp. rate 16, height 5\' 3"  (1.6 m), weight 56.246 kg (124 lb), SpO2 93 %.  PHYSICAL EXAMINATION:   Physical Exam  GENERAL:  80 y.o.-year-old patient lying in the bed with no acute distress. Thin,cachectic  appear ill EYES: Pupils equal, round, reactive to light and accommodation. No scleral icterus. Extraocular muscles intact.  HEENT: Head atraumatic, normocephalic. Oropharynx and nasopharynx clear. Oral thrush. NECK:  Supple, no jugular venous distention. No thyroid enlargement, no tenderness.  LUNGS: Normal breath sounds bilaterally, no wheezing, rales, rhonchi. No use of accessory muscles of respiration.  CARDIOVASCULAR: S1, S2 normal. No murmurs, rubs, or gallops.  ABDOMEN: Soft, nontender, nondistended. Bowel sounds present. No organomegaly or mass. PEG tube in place EXTREMITIES: No cyanosis, clubbing or edema b/l.    NEUROLOGIC: Cranial nerves II through XII are intact. No focal Motor or sensory deficits b/l.   PSYCHIATRIC: patient is alert and oriented x 3.  SKIN: No obvious rash, lesion, or ulcer.   LABORATORY PANEL:   CBC  Recent Labs Lab 06/09/15 0500 06/10/15 0619  WBC 5.5  --   HGB 10.3* 10.5*  HCT 32.6*  --   PLT 379  --    Chemistries   Recent Labs Lab 06/08/15 1425  06/10/15 0619  06/12/15 0513  NA 151*  < > 153*  --  139  K 4.1  < > 3.2*  < > 3.6  CL 117*  < > 120*  --  110  CO2 25  < > 24  --  24  GLUCOSE 160*  < > 88  --  190*  BUN 56*  < > 30*  --  12  CREATININE 1.30*  < > 1.18*  --  1.00  CALCIUM 9.1  < > 8.8*  --  8.2*  MG  --   --  1.9  --   --   AST  19  --   --   --   --   ALT 9*  --   --   --   --   ALKPHOS 89  --   --   --   --   BILITOT 0.7  --   --   --   --   < > = values in this interval not displayed.  Cardiac Enzymes  Recent Labs Lab 06/08/15 1425  TROPONINI 0.03    RADIOLOGY:  US Venous Img Lower Bilateral  06/11/2015   CLINICAL DATA:  History of deep venous thrombosis with recent GI bleed in need for discontinuing anti coagulation  EXAM: BILATERAL LOWER EXTREMITY VENOUS DOPPLER ULTRASOUND  TECHNIQUE: Gray-scale sonography with graded compression, as well as color Doppler and duplex ultrasound were performed to evaluate the lower  extremity deep venous systems from the level of the common femoral vein and including the common femoral, femoral, profunda femoral, popliteal and calf veins including the posterior tibial, peroneal and gastrocnemius veins when visible. The superficial great saphenous vein was also interrogated. Spectral Doppler was utilized to evaluate flow at rest and with distal augmentation maneuvers in the common femoral, femoral and popliteal veins.  COMPARISON:  03/31/2015  FINDINGS: RIGHT LOWER EXTREMITY  Common Femoral Vein: There is thrombus extending into the common femoral vein from the profunda femoral vein. Decreased compressibility is noted.  Saphenofemoral Junction: No evidence of thrombus. Normal compressibility and flow on color Doppler imaging.  Profunda Femoral Vein: Thrombus is noted within the profunda femoral vein extending across the junction of the profunda and superficial femoral vein. Decreased compressibility is noted.  Femoral Vein: No evidence of thrombus. Normal compressibility, respiratory phasicity and response to augmentation.  Popliteal Vein: No evidence of thrombus. Normal compressibility, respiratory phasicity and response to augmentation.  Calf Veins: No evidence of thrombus. Normal compressibility and flow on color Doppler imaging.  Superficial Great Saphenous Vein: No evidence of thrombus. Normal compressibility and flow on color Doppler imaging.  Venous Reflux:  None.  Other Findings:  None.  LEFT LOWER EXTREMITY  Common Femoral Vein: No evidence of thrombus. Normal compressibility, respiratory phasicity and response to augmentation.  Saphenofemoral Junction: No evidence of thrombus. Normal compressibility and flow on color Doppler imaging.  Profunda Femoral Vein: No evidence of thrombus. Normal compressibility and flow on color Doppler imaging.  Femoral Vein: No evidence of thrombus. Normal compressibility, respiratory phasicity and response to augmentation.  Popliteal Vein: No evidence of  thrombus. Normal compressibility, respiratory phasicity and response to augmentation.  Calf Veins: No evidence of thrombus. Normal compressibility and flow on color Doppler imaging.  Superficial Great Saphenous Vein: No evidence of thrombus. Normal compressibility and flow on color Doppler imaging.  Venous Reflux:  None.  Other Findings:  None.  IMPRESSION: No deep venous thrombosis is noted on the left.  On the right, there is evidence of thrombus extending from the profunda femoral vein into the common femoral vein.  These results will be called to the ordering clinician or representative by the Radiologist Assistant, and communication documented in the PACS or zVision Dashboard.   Electronically Signed   By: Alcide Clever M.D.   On: 06/11/2015 15:45     ASSESSMENT AND PLAN:  80 y.o. female who presents with abdominal pain, which is chronic and related to multiple recent abdominal surgeries came in with dark green/blackish stools  1. GI bleed appears chronic,slow given gradual drifting of hgb. -s/p  EGD showing grade C esophagitis.candidial esophagitis and oral  thrush - on ppi BID, only clear liquids by mouth.  - Continue PEG feedings- appreciate dietary input - Appreciate GI consult - on fluconazole, also add nystatin per ENT recommendations for oral thrush - she will need repeat EGD in 3 weeks to ensure improvement - Avoid asa, NSAIDS for now  2. RLE DVT- appears acute clot, no anticoagulation due to her severe gastritis, esophagitis and potential GI bleed - Vascular consulted- patient will need IVC filter - Patient refusing so far- discussed with son and niece and they are here to talk with patient  3. Systemic lupus erythematosus - continue home medications for this-on po prednisone  4.  Diabetes mellitus without complication - sliding scale insulin   5. Hypothyroidism - home dose thyroid replacement  6.  Hypernatremia- secondary to dehydration, continue free water via PEG,  -  discontinue D5 today  7. Deconditioning- continue physical therapy and discharge to [facility] rehab  Will verify why patient is on ciprofloxacin.  Case discussed with Care Management/Social Worker. Management plans discussed with the patient, family and they are in agreement.  CODE STATUS:full  DVT Prophylaxis: scd/teds  TOTAL TIME TAKING CARE OF THIS PATIENT: 36 minutes.   POSSIBLE D/C TOMORROW, DEPENDING ON CLINICAL CONDITION.   Enid Baas M.D on 06/12/2015 at 11:29 AM  Between 7am to 6pm - Pager - [phone]  After 6pm go to [email or web address] - password [email or web address] [facility]  [facility]  Office  [phone]  CC: Primary care physician; Sherlene Shams, MD

## 2015-06-12 NOTE — Progress Notes (Signed)
MEDICATION RELATED CONSULT NOTE - INITIAL   Pharmacy Consult for electrolyte management Indication: hypokalemia  Allergies  Allergen Reactions  . Amoxicillin Other (See Comments)    Reaction:  Stomach cramps   . Cefuroxime Itching  . Contrast Media [Iodinated Diagnostic Agents] Hives  . Cephalexin Rash  . Sulfonamide Derivatives Rash  . Tramadol Hcl Rash  . Venlafaxine Rash    Patient Measurements: Height: 5\' 3"  (160 cm) Weight: 124 lb (56.246 kg) IBW/kg (Calculated) : 52.4  Vital Signs: Temp: 98.3 F (36.8 C) (09/23 0448) Temp Source: Oral (09/23 0448) BP: 100/59 mmHg (09/23 0448) Pulse Rate: 92 (09/23 0448) Intake/Output from previous day: 09/22 0701 - 09/23 0700 In: 1410.8 [P.O.:360; I.V.:1050.8] Out: -   Labs:  Recent Labs  06/10/15 0619 06/12/15 0513  HGB 10.5*  --   CREATININE 1.18* 1.00  MG 1.9  --    Estimated Creatinine Clearance: 38.4 mL/min (by C-G formula based on Cr of 1).  Microbiology: Recent Results (from the past 720 hour(s))  Urine culture     Status: None   Collection Time: 05/29/15  5:30 PM  Result Value Ref Range Status   Specimen Description URINE, RANDOM  Final   Special Requests NONE  Final   Culture INSIGNIFICANT GROWTH  Final   Report Status 05/31/2015 FINAL  Final  Urine culture     Status: None   Collection Time: 06/01/15  9:10 AM  Result Value Ref Range Status   Specimen Description URINE, RANDOM  Final   Special Requests NONE  Final   Culture MULTIPLE SPECIES PRESENT, SUGGEST RECOLLECTION  Final   Report Status 06/03/2015 FINAL  Final  Wound culture     Status: None (Preliminary result)   Collection Time: 06/08/15 11:30 AM  Result Value Ref Range Status   Specimen Description PEG SITE  Final   Special Requests NONE  Final   Gram Stain   Final    RARE WBC SEEN RARE GRAM POSITIVE COCCI FEW GRAM NEGATIVE RODS    Culture   Final    HEAVY GROWTH KLEBSIELLA PNEUMONIAE HEAVY GROWTH GRAM NEGATIVE RODS HEAVY GROWTH  UNIDENTIFIED ORGANISM MODERATE GROWTH YEAST IDENTIFICATION AND SUSCEPTIBILITIES TO FOLLOW    Report Status PENDING  Incomplete   Organism ID, Bacteria KLEBSIELLA PNEUMONIAE  Final   Organism ID, Bacteria GRAM NEGATIVE RODS  Final      Susceptibility   Klebsiella pneumoniae - MIC*    AMPICILLIN >=32 RESISTANT Resistant     CEFTAZIDIME <=1 SENSITIVE Sensitive     CEFAZOLIN <=4 SENSITIVE Sensitive     CEFTRIAXONE <=1 SENSITIVE Sensitive     CIPROFLOXACIN <=0.25 SENSITIVE Sensitive     GENTAMICIN <=1 SENSITIVE Sensitive     IMIPENEM <=0.25 SENSITIVE Sensitive     TRIMETH/SULFA <=20 SENSITIVE Sensitive     PIP/TAZO Value in next row Sensitive      SENSITIVE<=4    * HEAVY GROWTH KLEBSIELLA PNEUMONIAE  MRSA PCR Screening     Status: None   Collection Time: 06/09/15  2:27 AM  Result Value Ref Range Status   MRSA by PCR NEGATIVE NEGATIVE Final    Comment:        The GeneXpert MRSA Assay (FDA approved for NASAL specimens only), is one component of a comprehensive MRSA colonization surveillance program. It is not intended to diagnose MRSA infection nor to guide or monitor treatment for MRSA infections.     Medical History: Past Medical History  Diagnosis Date  . Lupus (systemic lupus erythematosus)   .  Pulmonary hypertension   . Pneumonia   . Unspecified menopausal and postmenopausal disorder   . Primary pulmonary hypertension   . Acute glomerulonephritis with other specified pathological lesion in kidney in disease classified elsewhere(580.81)   . Unspecified hypothyroidism   . Shingles (herpes zoster) polyneuropathy March 2013    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  . Hiatal hernia   . DVT (deep venous thrombosis)   . Cancer     Breast  . CHF (congestive heart failure)   . Renal insufficiency     Medications:  Scheduled:  . antiseptic oral rinse  7 mL Mouth Rinse BID  . ciprofloxacin  500 mg Oral BID  . diclofenac  1 patch Transdermal BID  . feeding supplement  1  Container Oral TID WC  . fluconazole  100 mg Per Tube Daily  . free water  150 mL Per Tube BID  . hydroxychloroquine  200 mg Oral Daily  . insulin aspart  0-15 Units Subcutaneous TID WC  . letrozole  2.5 mg Oral Daily  . levothyroxine  150 mcg Oral QAC breakfast  . mometasone-formoterol  2 puff Inhalation BID  .  morphine injection  2 mg Intravenous Once  . mycophenolate  1,000 mg Oral BID  . pantoprazole (PROTONIX) IV  40 mg Intravenous Q12H  . predniSONE  1 mg Oral Daily  . predniSONE  5 mg Oral Daily  . sodium chloride  3 mL Intravenous Q12H  . Tadalafil (PAH)  40 mg Oral Daily   Infusions:  . dextrose 50 mL/hr at 06/12/15 0622  . feeding supplement (JEVITY 1.5 CAL/FIBER) 1,000 mL (06/11/15 1500)  . free water      Assessment: PD is a 80 yo female admitted for epigastric pain. Pharmacy is consulted to manage electrolytes in this patient.  Plan:  9/21 K+ 3.2, Mag 1.9 9/22 AM K+ 3.3 9/22 PM K+ 3.9 9/22 AM K+ 3.6  Potassium WNL.  Will recheck with AM labs.   Pharmacy will continue to monitor.  Eloise Harman, PharmD Clinical Pharmacist 06/12/2015,6:24 AM

## 2015-06-12 NOTE — Discharge Summary (Addendum)
[facility] [place] at [facility]   PATIENT NAME: Diana [name]    MR#:  [id number]  DATE OF BIRTH:  1935/03/09  DATE OF ADMISSION:  06/08/2015 ADMITTING PHYSICIAN: Oralia Manis, MD  DATE OF DISCHARGE: 06/13/2015  PRIMARY CARE PHYSICIAN: Sherlene Shams, MD    ADMISSION DIAGNOSIS:  Acute lower GI bleeding [K92.2] Lower abdominal pain [R10.30]  DISCHARGE DIAGNOSIS:  Principal Problem:   Rectal bleeding Active Problems:   Hypothyroidism   GERD   Systemic lupus erythematosus   Diabetes mellitus without complication   DVT, recurrent, lower extremity, chronic   GI bleed   SECONDARY DIAGNOSIS:   Past Medical History  Diagnosis Date  . Lupus (systemic lupus erythematosus)   . Pulmonary hypertension   . Pneumonia   . Unspecified menopausal and postmenopausal disorder   . Primary pulmonary hypertension   . Acute glomerulonephritis with other specified pathological lesion in kidney in disease classified elsewhere(580.81)   . Unspecified hypothyroidism   . Shingles (herpes zoster) polyneuropathy March 2013    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  . Hiatal hernia   . DVT (deep venous thrombosis)   . Cancer     Breast  . CHF (congestive heart failure)   . Renal insufficiency     HOSPITAL COURSE:   80 y.o. female who presents with abdominal pain, which is chronic and related to multiple recent abdominal surgeries came in with dark green/blackish stools  1. GI bleed appears chronic,slow given gradual drifting of hgb. -s/p EGD showing grade C esophagitis.candidial esophagitis and oral thrush - on ppi BID, only clear liquids by mouth.  - Continue PEG feedings- appreciate dietary input - Appreciate GI consult - on fluconazole, also added nystatin per ENT recommendations for oral thrush - she will need repeat EGD in 3 weeks to ensure improvement - Avoid asa, NSAIDS for now  2. RLE DVT- appears acute clot, no anticoagulation due to her severe  gastritis, esophagitis and potential GI bleed - Vascular consulted- IVC filter placed 06/12/15  3. Systemic lupus erythematosus - continue home medications for this-on po prednisone  4. Diabetes mellitus without complication - sliding scale insulin   5. Hypothyroidism - home dose thyroid replacement  6. Hypernatremia- secondary to dehydration, resolved now. - continue free water via PEG, and 150 ml bid via PEG  7. Deconditioning- continue physical therapy and discharge to [facility] rehab  DISCHARGE CONDITIONS:   Guarded  CONSULTS OBTAINED:  Treatment Team:  Christena Deem, MD Linus Salmons, MD Annice Needy, MD  DRUG ALLERGIES:   Allergies  Allergen Reactions  . Amoxicillin Other (See Comments)    Reaction:  Stomach cramps   . Cefuroxime Itching  . Contrast Media [Iodinated Diagnostic Agents] Hives  . Cephalexin Rash  . Sulfonamide Derivatives Rash  . Tramadol Hcl Rash  . Venlafaxine Rash    DISCHARGE MEDICATIONS:   Current Discharge Medication List    START taking these medications   Details  fluconazole (DIFLUCAN) 100 MG tablet Place 1 tablet (100 mg total) into feeding tube daily. Qty: 20 tablet, Refills: 0    Nutritional Supplements (FEEDING SUPPLEMENT, JEVITY 1.5 CAL/FIBER,) LIQD Place 1,000 mLs into feeding tube continuous. Qty: 30000 mL, Refills: 1    nystatin (MYCOSTATIN) 100000 UNIT/ML suspension Take 5 mLs (500,000 Units total) by mouth 4 (four) times daily. Qty: 60 mL, Refills: 0    !! Water For Irrigation, Sterile (FREE WATER) SOLN Place 150 mLs into feeding tube 2 (two) times daily. Qty: 1500 mL,  Refills: 2    !! Water For Irrigation, Sterile (FREE WATER) SOLN Place 25 mLs into feeding tube continuous. Qty: 1000 mL, Refills: 2    !! Water For Irrigation, Sterile (FREE WATER) SOLN Place 150 mLs into feeding tube 2 (two) times daily. Qty: 1500 mL, Refills: 2     !! - Potential duplicate medications found. Please discuss with provider.     CONTINUE these medications which have CHANGED   Details  ALPRAZolam (XANAX) 0.25 MG tablet Take 1 tablet (0.25 mg total) by mouth at bedtime as needed for anxiety. Qty: 20 tablet, Refills: 0    oxyCODONE-acetaminophen (PERCOCET/ROXICET) 5-325 MG per tablet Take 1 tablet by mouth every 6 (six) hours as needed for severe pain. Pt is able to be given PRN doses in addition to scheduled doses. Qty: 20 tablet, Refills: 0      CONTINUE these medications which have NOT CHANGED   Details  acetaminophen (TYLENOL) 325 MG tablet Take 650 mg by mouth every 4 (four) hours as needed for mild pain or fever.    Alum & Mag Hydroxide-Simeth (GI COCKTAIL) SUSP suspension Take 30 mLs by mouth every 6 (six) hours as needed (for severe reflux). Also has Lidocaine in it.  Shake well.    alum hydroxide-mag trisilicate (GAVISCON) 80-20 MG CHEW chewable tablet Chew 2 tablets by mouth 4 (four) times daily as needed for indigestion or heartburn.    azelastine (ASTELIN) 0.1 % nasal spray Place 1 spray into both nostrils 2 (two) times daily.    baclofen (LIORESAL) 10 MG tablet Take 10 mg by mouth 2 (two) times daily.    cyanocobalamin (,VITAMIN B-12,) 1000 MCG/ML injection Inject 1 mL (1,000 mcg total) into the muscle every 30 (thirty) days. Qty: 10 mL, Refills: 1    diclofenac (FLECTOR) 1.3 % PTCH Place 1 patch onto the skin 2 (two) times daily.    ferrous sulfate 220 (44 FE) MG/5ML solution Take 220 mg by mouth 3 (three) times daily with meals.    fluticasone (FLONASE) 50 MCG/ACT nasal spray Place 2 sprays into both nostrils daily as needed for rhinitis.    Fluticasone-Salmeterol (ADVAIR) 250-50 MCG/DOSE AEPB Inhale 1 puff into the lungs 2 (two) times daily.    hydroxychloroquine (PLAQUENIL) 200 MG tablet Take 200 mg by mouth daily.    letrozole (FEMARA) 2.5 MG tablet Take 2.5 mg by mouth daily.    levocetirizine (XYZAL) 5 MG tablet Take 5 mg by mouth daily.    loperamide (IMODIUM) 2 MG capsule Take 2-4  mg by mouth as needed for diarrhea or loose stools.    mycophenolate (CELLCEPT) 250 MG capsule Take 1,000 mg by mouth 2 (two) times daily.    ondansetron (ZOFRAN-ODT) 4 MG disintegrating tablet Take 1 tablet (4 mg total) by mouth every 4 (four) hours as needed for nausea or vomiting. Qty: 20 tablet, Refills: 0    pantoprazole sodium (PROTONIX) 40 mg/20 mL PACK Place 40 mg into feeding tube 2 (two) times daily.    !! predniSONE (DELTASONE) 5 MG tablet Take 5 mg by mouth daily. Pt takes in addition to the 1mg  tablet.    promethazine (PHENERGAN) 25 MG tablet Take 6.25-12.5 mg by mouth every 4 (four) hours as needed for nausea or vomiting.     promethazine (PHENERGAN) 25 MG/ML injection Inject 25 mg into the muscle every 4 (four) hours as needed for nausea or vomiting.    scopolamine (TRANSDERM-SCOP) 1 MG/3DAYS Place 1 patch onto the skin every 3 (three)  days.    SYNTHROID 150 MCG tablet Take 1 tablet (150 mcg total) by mouth daily. Qty: 30 tablet, Refills: 11    Tadalafil, PAH, (ADCIRCA) 20 MG TABS Take 40 mg by mouth daily.    vitamin C (ASCORBIC ACID) 500 MG tablet Take 1,000 mg by mouth daily.    cholecalciferol (VITAMIN D) 400 UNITS TABS tablet Take 400 Units by mouth daily.    cyclobenzaprine (FLEXERIL) 10 MG tablet Take 10 mg by mouth 3 (three) times daily as needed for muscle spasms.    gabapentin (NEURONTIN) 400 MG capsule Take 400 mg by mouth daily as needed (for nerve pain).    ipratropium (ATROVENT) 0.03 % nasal spray Place 2 sprays into both nostrils 3 (three) times daily.    !! predniSONE (DELTASONE) 1 MG tablet Take 1 mg by mouth daily. Pt takes in addition to the 5mg  tablet.    promethazine (PHENERGAN) 25 MG suppository Place 25 mg rectally every 4 (four) hours as needed for nausea or vomiting.     !! - Potential duplicate medications found. Please discuss with provider.    STOP taking these medications     furosemide (LASIX) 20 MG tablet      metoCLOPramide  (REGLAN) 5 MG tablet      potassium chloride 20 MEQ/15ML (10%) SOLN      ranitidine (ZANTAC) 300 MG tablet      sucralfate (CARAFATE) 1 GM/10ML suspension      warfarin (COUMADIN) 3 MG tablet          DISCHARGE INSTRUCTIONS:   1. GI f/u in 2 weeks 2. PCP f/u in 1-2 weeks  If you experience worsening of your admission symptoms, develop shortness of breath, life threatening emergency, suicidal or homicidal thoughts you must seek medical attention immediately by calling 911 or calling your MD immediately  if symptoms less severe.  You Must read complete instructions/literature along with all the possible adverse reactions/side effects for all the Medicines you take and that have been prescribed to you. Take any new Medicines after you have completely understood and accept all the possible adverse reactions/side effects.   Please note  You were cared for by a hospitalist during your hospital stay. If you have any questions about your discharge medications or the care you received while you were in the hospital after you are discharged, you can call the unit and asked to speak with the hospitalist on call if the hospitalist that took care of you is not available. Once you are discharged, your primary care physician will handle any further medical issues. Please note that NO REFILLS for any discharge medications will be authorized once you are discharged, as it is imperative that you return to your primary care physician (or establish a relationship with a primary care physician if you do not have one) for your aftercare needs so that they can reassess your need for medications and monitor your lab values.    Today   CHIEF COMPLAINT:   Chief Complaint  Patient presents with  . Abdominal Pain    VITAL SIGNS:  Blood pressure 106/54, pulse 93, temperature 97.8 F (36.6 C), temperature source Oral, resp. rate 16, height 5\' 3"  (1.6 m), weight 56.246 kg (124 lb), SpO2 99 %.  I/O:    Intake/Output Summary (Last 24 hours) at 06/12/15 1440 Last data filed at 06/12/15 0800  Gross per 24 hour  Intake 1909.83 ml  Output      0 ml  Net 1909.83 ml  PHYSICAL EXAMINATION:   Physical Exam  GENERAL: 80 y.o.-year-old patient lying in the bed with no acute distress. Thin,cachectic appear ill EYES: Pupils equal, round, reactive to light and accommodation. No scleral icterus. Extraocular muscles intact.  HEENT: Head atraumatic, normocephalic. Oropharynx and nasopharynx clear. Oral thrush. NECK: Supple, no jugular venous distention. No thyroid enlargement, no tenderness.  LUNGS: Normal breath sounds bilaterally, no wheezing, rales, rhonchi. No use of accessory muscles of respiration.  CARDIOVASCULAR: S1, S2 normal. No murmurs, rubs, or gallops.  ABDOMEN: Soft, nontender, nondistended. Bowel sounds present. No organomegaly or mass. PEG tube in place EXTREMITIES: No cyanosis, clubbing or edema b/l.  NEUROLOGIC: Cranial nerves II through XII are intact. No focal Motor or sensory deficits b/l.  PSYCHIATRIC: patient is alert and oriented x 3.  SKIN: No obvious rash, lesion, or ulcer.   DATA REVIEW:   CBC  Recent Labs Lab 06/09/15 0500 06/10/15 0619  WBC 5.5  --   HGB 10.3* 10.5*  HCT 32.6*  --   PLT 379  --     Chemistries   Recent Labs Lab 06/08/15 1425  06/10/15 0619  06/12/15 0513  NA 151*  < > 153*  --  139  K 4.1  < > 3.2*  < > 3.6  CL 117*  < > 120*  --  110  CO2 25  < > 24  --  24  GLUCOSE 160*  < > 88  --  190*  BUN 56*  < > 30*  --  12  CREATININE 1.30*  < > 1.18*  --  1.00  CALCIUM 9.1  < > 8.8*  --  8.2*  MG  --   --  1.9  --   --   AST 19  --   --   --   --   ALT 9*  --   --   --   --   ALKPHOS 89  --   --   --   --   BILITOT 0.7  --   --   --   --   < > = values in this interval not displayed.  Cardiac Enzymes  Recent Labs Lab 06/08/15 1425  TROPONINI 0.03    Microbiology Results  Results for orders placed or  performed during the hospital encounter of 06/08/15  MRSA PCR Screening     Status: None   Collection Time: 06/09/15  2:27 AM  Result Value Ref Range Status   MRSA by PCR NEGATIVE NEGATIVE Final    Comment:        The GeneXpert MRSA Assay (FDA approved for NASAL specimens only), is one component of a comprehensive MRSA colonization surveillance program. It is not intended to diagnose MRSA infection nor to guide or monitor treatment for MRSA infections.     RADIOLOGY:  US Venous Img Lower Bilateral  06/11/2015   CLINICAL DATA:  History of deep venous thrombosis with recent GI bleed in need for discontinuing anti coagulation  EXAM: BILATERAL LOWER EXTREMITY VENOUS DOPPLER ULTRASOUND  TECHNIQUE: Gray-scale sonography with graded compression, as well as color Doppler and duplex ultrasound were performed to evaluate the lower extremity deep venous systems from the level of the common femoral vein and including the common femoral, femoral, profunda femoral, popliteal and calf veins including the posterior tibial, peroneal and gastrocnemius veins when visible. The superficial great saphenous vein was also interrogated. Spectral Doppler was utilized to evaluate flow at rest and with distal augmentation maneuvers in the  common femoral, femoral and popliteal veins.  COMPARISON:  03/31/2015  FINDINGS: RIGHT LOWER EXTREMITY  Common Femoral Vein: There is thrombus extending into the common femoral vein from the profunda femoral vein. Decreased compressibility is noted.  Saphenofemoral Junction: No evidence of thrombus. Normal compressibility and flow on color Doppler imaging.  Profunda Femoral Vein: Thrombus is noted within the profunda femoral vein extending across the junction of the profunda and superficial femoral vein. Decreased compressibility is noted.  Femoral Vein: No evidence of thrombus. Normal compressibility, respiratory phasicity and response to augmentation.  Popliteal Vein: No evidence of  thrombus. Normal compressibility, respiratory phasicity and response to augmentation.  Calf Veins: No evidence of thrombus. Normal compressibility and flow on color Doppler imaging.  Superficial Great Saphenous Vein: No evidence of thrombus. Normal compressibility and flow on color Doppler imaging.  Venous Reflux:  None.  Other Findings:  None.  LEFT LOWER EXTREMITY  Common Femoral Vein: No evidence of thrombus. Normal compressibility, respiratory phasicity and response to augmentation.  Saphenofemoral Junction: No evidence of thrombus. Normal compressibility and flow on color Doppler imaging.  Profunda Femoral Vein: No evidence of thrombus. Normal compressibility and flow on color Doppler imaging.  Femoral Vein: No evidence of thrombus. Normal compressibility, respiratory phasicity and response to augmentation.  Popliteal Vein: No evidence of thrombus. Normal compressibility, respiratory phasicity and response to augmentation.  Calf Veins: No evidence of thrombus. Normal compressibility and flow on color Doppler imaging.  Superficial Great Saphenous Vein: No evidence of thrombus. Normal compressibility and flow on color Doppler imaging.  Venous Reflux:  None.  Other Findings:  None.  IMPRESSION: No deep venous thrombosis is noted on the left.  On the right, there is evidence of thrombus extending from the profunda femoral vein into the common femoral vein.  These results will be called to the ordering clinician or representative by the Radiologist Assistant, and communication documented in the PACS or zVision Dashboard.   Electronically Signed   By: Alcide Clever M.D.   On: 06/11/2015 15:45    EKG:   Orders placed or performed during the hospital encounter of 06/08/15  . ED EKG  . ED EKG  . EKG 12-Lead  . EKG 12-Lead  . ED EKG  . ED EKG      Management plans discussed with the patient, family and they are in agreement.  CODE STATUS:     Code Status Orders        Start     Ordered   06/09/15  0142  Full code   Continuous     06/09/15 0141      TOTAL TIME TAKING CARE OF THIS PATIENT: 36 minutes.    Enid Baas M.D on 06/12/2015 at 2:40 PM  Between 7am to 6pm - Pager - [phone]  After 6pm go to [email or web address] - password [email or web address] [facility]  [place] [facility]  Office  [phone]  CC: Primary care physician; Sherlene Shams, MD

## 2015-06-12 NOTE — Progress Notes (Signed)
Plan is for patient to D/C to [facility] tomorrow 06/13/15. Per Kim admissions coordinator at [facility] patient is going to room 205. RN will call report at [phone]. CSW sent D/C Summary Friday 06/12/15 to Kim via carefinder. Patient is aware of above. CSW also contacted patient's son Quillian Quince and made him aware of above. CSW will continue to follow and assist as needed.   Blima Rich, [place] [phone]

## 2015-06-12 NOTE — Plan of Care (Signed)
Problem: Discharge Progression Outcomes Goal: Other Discharge Outcomes/Goals Outcome: Progressing Plan of care progress to goal for: 1. Discharge Plan:        In Place and Appropriate.        Plan to Return to [place]. 2. Pain:        Pain Controlled with Appropriate Interventions.        Denies pain. 3. Hemodynamically Stable:        VSS.        Afebrile.        Resumed TF. Free H2O 26ml/hr. Tolerating Well.        Free H2O 1515ml/Q 12hrs.        Remains on Oral Antibiotics.          4. Complications:        No signs/symptoms of complications noted. 5. Diet:        Clear Liquids. Tolerating Well.         Resumed TF. Free H2O 77ml/hr. Tolerating Well.        Free H2O 1560ml/Q 12hrs. 6. Activity:        One Person Assist to [employer]. Tolerated Well.

## 2015-06-12 NOTE — Progress Notes (Signed)
PT Cancellation Note  Patient Details Name: Diana [name] MRN: [id number] DOB: [birth year]   Cancelled Treatment:    Reason Eval/Treat Not Completed: Medical issues which prohibited therapy (Per chart review, patient noted with acute R LE DVT.  Vascular currently recommending IVC filter placement, as patient not candidate for [facility] due to history of GIB.  Patient waiting to discuss with family prior to making final plan.  Will hold therapy until plan established.  If IVC filter placed, will be okay for continued mobility; if not, will require MD clearance prior to continued mobility.)   Dhruti Ghuman H. Owens Shark, PT, DPT, NCS 06/12/2015, 10:47 AM [phone]

## 2015-06-13 DIAGNOSIS — B370 Candidal stomatitis: Secondary | ICD-10-CM | POA: Diagnosis not present

## 2015-06-13 DIAGNOSIS — K209 Esophagitis, unspecified: Secondary | ICD-10-CM | POA: Diagnosis not present

## 2015-06-13 DIAGNOSIS — K224 Dyskinesia of esophagus: Secondary | ICD-10-CM | POA: Diagnosis not present

## 2015-06-13 DIAGNOSIS — E119 Type 2 diabetes mellitus without complications: Secondary | ICD-10-CM | POA: Diagnosis not present

## 2015-06-13 DIAGNOSIS — M6281 Muscle weakness (generalized): Secondary | ICD-10-CM | POA: Diagnosis not present

## 2015-06-13 DIAGNOSIS — Z431 Encounter for attention to gastrostomy: Secondary | ICD-10-CM | POA: Diagnosis not present

## 2015-06-13 DIAGNOSIS — I82411 Acute embolism and thrombosis of right femoral vein: Secondary | ICD-10-CM | POA: Diagnosis not present

## 2015-06-13 DIAGNOSIS — K2900 Acute gastritis without bleeding: Secondary | ICD-10-CM | POA: Diagnosis not present

## 2015-06-13 DIAGNOSIS — K449 Diaphragmatic hernia without obstruction or gangrene: Secondary | ICD-10-CM | POA: Diagnosis not present

## 2015-06-13 DIAGNOSIS — K922 Gastrointestinal hemorrhage, unspecified: Secondary | ICD-10-CM | POA: Diagnosis not present

## 2015-06-13 DIAGNOSIS — D649 Anemia, unspecified: Secondary | ICD-10-CM | POA: Diagnosis not present

## 2015-06-13 DIAGNOSIS — Z48815 Encounter for surgical aftercare following surgery on the digestive system: Secondary | ICD-10-CM | POA: Diagnosis not present

## 2015-06-13 DIAGNOSIS — R110 Nausea: Secondary | ICD-10-CM | POA: Diagnosis not present

## 2015-06-13 DIAGNOSIS — Z95828 Presence of other vascular implants and grafts: Secondary | ICD-10-CM | POA: Diagnosis not present

## 2015-06-13 DIAGNOSIS — K210 Gastro-esophageal reflux disease with esophagitis: Secondary | ICD-10-CM | POA: Diagnosis not present

## 2015-06-13 DIAGNOSIS — Z86718 Personal history of other venous thrombosis and embolism: Secondary | ICD-10-CM | POA: Diagnosis not present

## 2015-06-13 DIAGNOSIS — K208 Other esophagitis: Secondary | ICD-10-CM | POA: Diagnosis not present

## 2015-06-13 LAB — WOUND CULTURE

## 2015-06-13 LAB — BASIC METABOLIC PANEL
ANION GAP: 7 (ref 5–15)
BUN: 12 mg/dL (ref 6–20)
CALCIUM: 8 mg/dL — AB (ref 8.9–10.3)
CHLORIDE: 108 mmol/L (ref 101–111)
CO2: 22 mmol/L (ref 22–32)
Creatinine, Ser: 0.81 mg/dL (ref 0.44–1.00)
GFR calc Af Amer: >60 mL/min (ref >60)
GFR calc non Af Amer: >60 mL/min (ref >60)
Glucose, Bld: 203 mg/dL — ABNORMAL HIGH (ref 65–99)
Potassium: 3.8 mmol/L (ref 3.5–5.1)
SODIUM: 137 mmol/L (ref 135–145)

## 2015-06-13 LAB — GLUCOSE, CAPILLARY
GLUCOSE-CAPILLARY: 169 mg/dL — AB (ref 65–99)
Glucose-Capillary: 170 mg/dL — ABNORMAL HIGH (ref 65–99)

## 2015-06-13 MED ORDER — FREE WATER: 150.0000 mL | Freq: Two times a day (BID) | Status: DC

## 2015-06-13 MED ORDER — FREE WATER: 25.0000 mL | Status: DC

## 2015-06-13 NOTE — Plan of Care (Signed)
Problem: Discharge Progression Outcomes Goal: Other Discharge Outcomes/Goals Outcome: Progressing Plan of care progress to goal: 1. Barriers to progression: a right leg DVT was found since admission after a US doppler was performed. Pt initially declined the placement of an IVC. IVC was placed yesterday. Dressing to Left groin clean, dry and intact. 2. Discharge plan: Pt for discharge to [facility] when clinically stable. 3. Hemodynamics: Vital signs stable. NSR with PVCs on the monitor. HR in the upper 90's. 4. Diet: Pt on clear liquids along with Jevity 1.5. Rate increased yesterday from 40 to 55. So far no residuals this shift. No c/o nausea or vomiting. No abdominal distention. HOB elevated to > 30 degrees.

## 2015-06-13 NOTE — Progress Notes (Signed)
MEDICATION RELATED CONSULT NOTE - INITIAL   Pharmacy Consult for electrolyte management Indication: hypokalemia  Allergies  Allergen Reactions  . Amoxicillin Other (See Comments)    Reaction:  Stomach cramps   . Cefuroxime Itching  . Contrast Media [Iodinated Diagnostic Agents] Hives  . Cephalexin Rash  . Sulfonamide Derivatives Rash  . Tramadol Hcl Rash  . Venlafaxine Rash    Patient Measurements: Height: 5\' 3"  (160 cm) Weight: 124 lb (56.246 kg) IBW/kg (Calculated) : 52.4  Vital Signs: Temp: 97.6 F (36.4 C) (09/24 0451) Temp Source: Oral (09/24 0451) BP: 97/56 mmHg (09/24 0451) Pulse Rate: 91 (09/24 0451) Intake/Output from previous day: 09/23 0701 - 09/24 0700 In: 1181 [NG/GT:650] Out: 445 [Urine:400; Drains:45]  Labs:  Recent Labs  06/12/15 0513 06/13/15 0548  CREATININE 1.00 0.81   Estimated Creatinine Clearance: 47.4 mL/min (by C-G formula based on Cr of 0.81).  Microbiology: Recent Results (from the past 720 hour(s))  Urine culture     Status: None   Collection Time: 05/29/15  5:30 PM  Result Value Ref Range Status   Specimen Description URINE, RANDOM  Final   Special Requests NONE  Final   Culture INSIGNIFICANT GROWTH  Final   Report Status 05/31/2015 FINAL  Final  Urine culture     Status: None   Collection Time: 06/01/15  9:10 AM  Result Value Ref Range Status   Specimen Description URINE, RANDOM  Final   Special Requests NONE  Final   Culture MULTIPLE SPECIES PRESENT, SUGGEST RECOLLECTION  Final   Report Status 06/03/2015 FINAL  Final  Wound culture     Status: None (Preliminary result)   Collection Time: 06/08/15 11:30 AM  Result Value Ref Range Status   Specimen Description PEG SITE  Final   Special Requests NONE  Final   Gram Stain   Final    RARE WBC SEEN RARE GRAM POSITIVE COCCI FEW GRAM NEGATIVE RODS    Culture   Final    HEAVY GROWTH KLEBSIELLA PNEUMONIAE HEAVY GROWTH GRAM NEGATIVE RODS MODERATE GROWTH YEAST IDENTIFICATION  AND SUSCEPTIBILITIES TO FOLLOW    Report Status PENDING  Incomplete   Organism ID, Bacteria KLEBSIELLA PNEUMONIAE  Final   Organism ID, Bacteria GRAM NEGATIVE RODS  Final      Susceptibility   Klebsiella pneumoniae - MIC*    AMPICILLIN >=32 RESISTANT Resistant     CEFTAZIDIME <=1 SENSITIVE Sensitive     CEFAZOLIN <=4 SENSITIVE Sensitive     CEFTRIAXONE <=1 SENSITIVE Sensitive     CIPROFLOXACIN <=0.25 SENSITIVE Sensitive     GENTAMICIN <=1 SENSITIVE Sensitive     IMIPENEM <=0.25 SENSITIVE Sensitive     TRIMETH/SULFA <=20 SENSITIVE Sensitive     PIP/TAZO Value in next row Sensitive      SENSITIVE<=4    * HEAVY GROWTH KLEBSIELLA PNEUMONIAE  MRSA PCR Screening     Status: None   Collection Time: 06/09/15  2:27 AM  Result Value Ref Range Status   MRSA by PCR NEGATIVE NEGATIVE Final    Comment:        The GeneXpert MRSA Assay (FDA approved for NASAL specimens only), is one component of a comprehensive MRSA colonization surveillance program. It is not intended to diagnose MRSA infection nor to guide or monitor treatment for MRSA infections.     Medical History: Past Medical History  Diagnosis Date  . Lupus (systemic lupus erythematosus)   . Pulmonary hypertension   . Pneumonia   . Unspecified menopausal and postmenopausal disorder   .  Primary pulmonary hypertension   . Acute glomerulonephritis with other specified pathological lesion in kidney in disease classified elsewhere(580.81)   . Unspecified hypothyroidism   . Shingles (herpes zoster) polyneuropathy March 2013    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  . Hiatal hernia   . DVT (deep venous thrombosis)   . Cancer     Breast  . CHF (congestive heart failure)   . Renal insufficiency     Medications:  Scheduled:  . antiseptic oral rinse  7 mL Mouth Rinse BID  . ciprofloxacin  500 mg Oral BID  . diclofenac  1 patch Transdermal BID  . feeding supplement  1 Container Oral TID WC  . fluconazole  100 mg Per Tube Daily   . free water  150 mL Per Tube BID  . hydroxychloroquine  200 mg Oral Daily  . insulin aspart  0-15 Units Subcutaneous TID WC  . letrozole  2.5 mg Oral Daily  . levothyroxine  150 mcg Oral QAC breakfast  . mometasone-formoterol  2 puff Inhalation BID  . mycophenolate  1,000 mg Oral BID  . nystatin  5 mL Oral QID  . pantoprazole (PROTONIX) IV  40 mg Intravenous Q12H  . predniSONE  1 mg Oral Daily  . predniSONE  5 mg Oral Daily  . sodium chloride  3 mL Intravenous Q12H  . Tadalafil (PAH)  40 mg Oral Daily   Infusions:  . sodium chloride Stopped (06/12/15 1700)  . feeding supplement (JEVITY 1.5 CAL/FIBER) 1,000 mL (06/12/15 1700)  . free water      Assessment: PD is a 80 yo female admitted for epigastric pain. Pharmacy is consulted to manage electrolytes in this patient.  Plan:  9/21 K+ 3.2, Mag 1.9 9/22 AM K+ 3.3 9/22 PM K+ 3.9 9/23 AM K+ 3.6 9/24 AM K+ 3.8  Potassium WNL.  Will recheck with AM labs.   Pharmacy will continue to monitor.  Eloise Harman, PharmD Clinical Pharmacist 06/13/2015,6:42 AM

## 2015-06-13 NOTE — Progress Notes (Signed)
MD making rounds. Discharge orders received. Social Worker facilitating discharge to [employer]. Telemetry Removed. IV removed. VSS. No s/s of distress noted. Denies pain. Denies needs. Report called to [facility], LPN at [facility]. No unanswered questions. Discharge Packet given to Butch Penny, niece. Instructed to hand to [place], LPN or caretaker at [facility]. Verbalized Understanding. Discharged via wheel chair by Nursing Staff. Belongings sent with Family.

## 2015-06-13 NOTE — Clinical Social Work Note (Signed)
CSW notified RN, pt, pt's family that pt would DC back to [facility] today.  Family will transport.  Facility notified Friday.  CSW signing off

## 2015-06-14 LAB — GLUCOSE, CAPILLARY
Glucose-Capillary: 109 mg/dL — ABNORMAL HIGH (ref 65–99)
Glucose-Capillary: 139 mg/dL — ABNORMAL HIGH (ref 65–99)

## 2015-06-14 NOTE — Clinical Social Work Note (Signed)
Peg Tube instructions faxed to facility yesterday.  On call staff notified to expect fax.

## 2015-06-15 ENCOUNTER — Ambulatory Visit: Payer: Medicare Other | Admitting: Internal Medicine

## 2015-06-15 ENCOUNTER — Telehealth: Payer: Self-pay

## 2015-06-15 ENCOUNTER — Encounter: Payer: Self-pay | Admitting: Vascular Surgery

## 2015-06-15 DIAGNOSIS — Z86718 Personal history of other venous thrombosis and embolism: Secondary | ICD-10-CM | POA: Diagnosis not present

## 2015-06-15 DIAGNOSIS — K208 Other esophagitis: Secondary | ICD-10-CM | POA: Diagnosis not present

## 2015-06-15 DIAGNOSIS — K922 Gastrointestinal hemorrhage, unspecified: Secondary | ICD-10-CM | POA: Diagnosis not present

## 2015-06-15 DIAGNOSIS — D649 Anemia, unspecified: Secondary | ICD-10-CM | POA: Diagnosis not present

## 2015-06-15 LAB — GLUCOSE, CAPILLARY
GLUCOSE-CAPILLARY: 125 mg/dL — AB (ref 65–99)
GLUCOSE-CAPILLARY: 101 mg/dL — AB (ref 65–99)
GLUCOSE-CAPILLARY: 162 mg/dL — AB (ref 65–99)
Glucose-Capillary: 106 mg/dL — ABNORMAL HIGH (ref 65–99)

## 2015-06-15 NOTE — Telephone Encounter (Signed)
Unable to reach patient.  TCM call 1st attempt made.  Will continue to follow.

## 2015-06-15 NOTE — Telephone Encounter (Signed)
Patient discharged to [facility] rehab and will call back to schedule hospital follow up when discharged home in 1-2 weeks.

## 2015-06-15 NOTE — Telephone Encounter (Signed)
FYI

## 2015-06-16 LAB — CBC WITH DIFFERENTIAL/PLATELET
Basophils Absolute: 0.1 10*3/uL (ref 0–0.1)
Basophils Relative: 1 %
EOS ABS: 0.3 10*3/uL (ref 0–0.7)
Eosinophils Relative: 4 %
HCT: 29.1 % — ABNORMAL LOW (ref 35.0–47.0)
HEMOGLOBIN: 9.2 g/dL — AB (ref 12.0–16.0)
LYMPHS ABS: 1.6 10*3/uL (ref 1.0–3.6)
LYMPHS PCT: 22 %
MCH: 26.8 pg (ref 26.0–34.0)
MCHC: 31.6 g/dL — ABNORMAL LOW (ref 32.0–36.0)
MCV: 85 fL (ref 80.0–100.0)
Monocytes Absolute: 0.9 10*3/uL (ref 0.2–0.9)
Monocytes Relative: 13 %
NEUTROS PCT: 60 %
Neutro Abs: 4.3 10*3/uL (ref 1.4–6.5)
Platelets: 201 10*3/uL (ref 150–440)
RBC: 3.42 MIL/uL — AB (ref 3.80–5.20)
RDW: 17.9 % — ABNORMAL HIGH (ref 11.5–14.5)
WBC: 7.2 10*3/uL (ref 3.6–11.0)

## 2015-06-16 LAB — GLUCOSE, CAPILLARY
GLUCOSE-CAPILLARY: 104 mg/dL — AB (ref 65–99)
GLUCOSE-CAPILLARY: 108 mg/dL — AB (ref 65–99)
Glucose-Capillary: 119 mg/dL — ABNORMAL HIGH (ref 65–99)

## 2015-06-16 LAB — BASIC METABOLIC PANEL
ANION GAP: 8 (ref 5–15)
BUN: 15 mg/dL (ref 6–20)
CHLORIDE: 108 mmol/L (ref 101–111)
CO2: 23 mmol/L (ref 22–32)
Calcium: 8 mg/dL — ABNORMAL LOW (ref 8.9–10.3)
Creatinine, Ser: 0.95 mg/dL (ref 0.44–1.00)
GFR calc Af Amer: >60 mL/min (ref >60)
GFR calc non Af Amer: 56 mL/min — ABNORMAL LOW (ref >60)
Glucose, Bld: 87 mg/dL (ref 65–99)
POTASSIUM: 3.7 mmol/L (ref 3.5–5.1)
SODIUM: 139 mmol/L (ref 135–145)

## 2015-06-17 ENCOUNTER — Ambulatory Visit: Payer: Medicare Other | Admitting: Internal Medicine

## 2015-06-17 LAB — GLUCOSE, CAPILLARY
GLUCOSE-CAPILLARY: 193 mg/dL — AB (ref 65–99)
GLUCOSE-CAPILLARY: 101 mg/dL — AB (ref 65–99)
Glucose-Capillary: 106 mg/dL — ABNORMAL HIGH (ref 65–99)

## 2015-06-18 LAB — COMPREHENSIVE METABOLIC PANEL
ALT: 13 U/L — ABNORMAL LOW (ref 14–54)
ANION GAP: 5 (ref 5–15)
AST: 24 U/L (ref 15–41)
Albumin: 2.3 g/dL — ABNORMAL LOW (ref 3.5–5.0)
Alkaline Phosphatase: 81 U/L (ref 38–126)
BUN: 26 mg/dL — ABNORMAL HIGH (ref 6–20)
CHLORIDE: 105 mmol/L (ref 101–111)
CO2: 23 mmol/L (ref 22–32)
Calcium: 7.7 mg/dL — ABNORMAL LOW (ref 8.9–10.3)
Creatinine, Ser: 1.02 mg/dL — ABNORMAL HIGH (ref 0.44–1.00)
GFR, EST AFRICAN AMERICAN: 59 mL/min — AB (ref >60)
GFR, EST NON AFRICAN AMERICAN: 51 mL/min — AB (ref >60)
Glucose, Bld: 154 mg/dL — ABNORMAL HIGH (ref 65–99)
POTASSIUM: 3.6 mmol/L (ref 3.5–5.1)
Sodium: 133 mmol/L — ABNORMAL LOW (ref 135–145)
Total Bilirubin: 0.5 mg/dL (ref 0.3–1.2)
Total Protein: 5 g/dL — ABNORMAL LOW (ref 6.5–8.1)

## 2015-06-18 LAB — CBC WITH DIFFERENTIAL/PLATELET
BASOS ABS: 0.1 10*3/uL (ref 0–0.1)
Basophils Relative: 1 %
EOS PCT: 5 %
Eosinophils Absolute: 0.2 10*3/uL (ref 0–0.7)
HCT: 28.5 % — ABNORMAL LOW (ref 35.0–47.0)
Hemoglobin: 9.3 g/dL — ABNORMAL LOW (ref 12.0–16.0)
Lymphocytes Relative: 23 %
Lymphs Abs: 1.2 10*3/uL (ref 1.0–3.6)
MCH: 27.7 pg (ref 26.0–34.0)
MCHC: 32.6 g/dL (ref 32.0–36.0)
MCV: 84.8 fL (ref 80.0–100.0)
MONO ABS: 1 10*3/uL — AB (ref 0.2–0.9)
Monocytes Relative: 18 %
NEUTROS ABS: 2.8 10*3/uL (ref 1.4–6.5)
NEUTROS PCT: 53 %
PLATELETS: 198 10*3/uL (ref 150–440)
RBC: 3.36 MIL/uL — ABNORMAL LOW (ref 3.80–5.20)
RDW: 18.5 % — AB (ref 11.5–14.5)
WBC: 5.4 10*3/uL (ref 3.6–11.0)

## 2015-06-18 LAB — GLUCOSE, CAPILLARY
Glucose-Capillary: 100 mg/dL — ABNORMAL HIGH (ref 65–99)
Glucose-Capillary: 125 mg/dL — ABNORMAL HIGH (ref 65–99)

## 2015-06-19 LAB — GLUCOSE, CAPILLARY
GLUCOSE-CAPILLARY: 162 mg/dL — AB (ref 65–99)
GLUCOSE-CAPILLARY: 141 mg/dL — AB (ref 65–99)
GLUCOSE-CAPILLARY: 144 mg/dL — AB (ref 65–99)
GLUCOSE-CAPILLARY: 184 mg/dL — AB (ref 65–99)
GLUCOSE-CAPILLARY: 118 mg/dL — AB (ref 65–99)
Glucose-Capillary: 153 mg/dL — ABNORMAL HIGH (ref 65–99)
Glucose-Capillary: 111 mg/dL — ABNORMAL HIGH (ref 65–99)

## 2015-06-20 ENCOUNTER — Encounter
Admission: RE | Admit: 2015-06-20 | Discharge: 2015-06-20 | Disposition: A | Discharge status: Home / Self Care | Payer: Medicare Other | Source: Ambulatory Visit | Attending: Internal Medicine | Admitting: Internal Medicine

## 2015-06-20 DIAGNOSIS — E871 Hypo-osmolality and hyponatremia: Secondary | ICD-10-CM | POA: Insufficient documentation

## 2015-06-20 DIAGNOSIS — D649 Anemia, unspecified: Secondary | ICD-10-CM | POA: Insufficient documentation

## 2015-06-20 DIAGNOSIS — R05 Cough: Secondary | ICD-10-CM | POA: Insufficient documentation

## 2015-06-20 LAB — GLUCOSE, CAPILLARY
GLUCOSE-CAPILLARY: 139 mg/dL — AB (ref 65–99)
GLUCOSE-CAPILLARY: 123 mg/dL — AB (ref 65–99)
GLUCOSE-CAPILLARY: 156 mg/dL — AB (ref 65–99)
Glucose-Capillary: 134 mg/dL — ABNORMAL HIGH (ref 65–99)

## 2015-06-21 LAB — GLUCOSE, CAPILLARY
GLUCOSE-CAPILLARY: 126 mg/dL — AB (ref 65–99)
Glucose-Capillary: 125 mg/dL — ABNORMAL HIGH (ref 65–99)
Glucose-Capillary: 144 mg/dL — ABNORMAL HIGH (ref 65–99)
Glucose-Capillary: 138 mg/dL — ABNORMAL HIGH (ref 65–99)

## 2015-06-23 LAB — CBC WITH DIFFERENTIAL/PLATELET
BASOS ABS: 0.1 10*3/uL (ref 0–0.1)
BASOS PCT: 1 %
EOS ABS: 0.3 10*3/uL (ref 0–0.7)
EOS PCT: 5 %
HCT: 31.1 % — ABNORMAL LOW (ref 35.0–47.0)
Hemoglobin: 10 g/dL — ABNORMAL LOW (ref 12.0–16.0)
Lymphocytes Relative: 31 %
Lymphs Abs: 2 10*3/uL (ref 1.0–3.6)
MCH: 27.5 pg (ref 26.0–34.0)
MCHC: 32.2 g/dL (ref 32.0–36.0)
MCV: 85.6 fL (ref 80.0–100.0)
Monocytes Absolute: 0.7 10*3/uL (ref 0.2–0.9)
Monocytes Relative: 10 %
Neutro Abs: 3.5 10*3/uL (ref 1.4–6.5)
Neutrophils Relative %: 53 %
PLATELETS: 209 10*3/uL (ref 150–440)
RBC: 3.63 MIL/uL — AB (ref 3.80–5.20)
RDW: 18.2 % — ABNORMAL HIGH (ref 11.5–14.5)
WBC: 6.5 10*3/uL (ref 3.6–11.0)

## 2015-06-23 LAB — COMPREHENSIVE METABOLIC PANEL WITH GFR
ALT: 16 U/L (ref 14–54)
AST: 31 U/L (ref 15–41)
Albumin: 2.2 g/dL — ABNORMAL LOW (ref 3.5–5.0)
Alkaline Phosphatase: 93 U/L (ref 38–126)
Anion gap: 10 (ref 5–15)
BUN: 35 mg/dL — ABNORMAL HIGH (ref 6–20)
CO2: 25 mmol/L (ref 22–32)
Calcium: 8.4 mg/dL — ABNORMAL LOW (ref 8.9–10.3)
Chloride: 109 mmol/L (ref 101–111)
Creatinine, Ser: 0.93 mg/dL (ref 0.44–1.00)
GFR calc Af Amer: >60 mL/min
GFR calc non Af Amer: 57 mL/min — ABNORMAL LOW
Glucose, Bld: 116 mg/dL — ABNORMAL HIGH (ref 65–99)
Potassium: 3.9 mmol/L (ref 3.5–5.1)
Sodium: 144 mmol/L (ref 135–145)
Total Bilirubin: 0.5 mg/dL (ref 0.3–1.2)
Total Protein: 5.5 g/dL — ABNORMAL LOW (ref 6.5–8.1)

## 2015-06-23 LAB — GLUCOSE, CAPILLARY
GLUCOSE-CAPILLARY: 112 mg/dL — AB (ref 65–99)
Glucose-Capillary: 120 mg/dL — ABNORMAL HIGH (ref 65–99)
Glucose-Capillary: 167 mg/dL — ABNORMAL HIGH (ref 65–99)

## 2015-06-23 LAB — EXPECTORATED SPUTUM ASSESSMENT W GRAM STAIN, RFLX TO RESP C

## 2015-06-24 LAB — GLUCOSE, CAPILLARY
GLUCOSE-CAPILLARY: 123 mg/dL — AB (ref 65–99)
GLUCOSE-CAPILLARY: 174 mg/dL — AB (ref 65–99)
GLUCOSE-CAPILLARY: 105 mg/dL — AB (ref 65–99)
GLUCOSE-CAPILLARY: 100 mg/dL — AB (ref 65–99)
Glucose-Capillary: 121 mg/dL — ABNORMAL HIGH (ref 65–99)

## 2015-06-25 LAB — GLUCOSE, CAPILLARY: Glucose-Capillary: 101 mg/dL — ABNORMAL HIGH (ref 65–99)

## 2015-06-28 LAB — GLUCOSE, CAPILLARY
GLUCOSE-CAPILLARY: 130 mg/dL — AB (ref 65–99)
GLUCOSE-CAPILLARY: 143 mg/dL — AB (ref 65–99)
GLUCOSE-CAPILLARY: 104 mg/dL — AB (ref 65–99)
GLUCOSE-CAPILLARY: 110 mg/dL — AB (ref 65–99)
Glucose-Capillary: 125 mg/dL — ABNORMAL HIGH (ref 65–99)
Glucose-Capillary: 116 mg/dL — ABNORMAL HIGH (ref 65–99)

## 2015-06-29 LAB — GLUCOSE, CAPILLARY
GLUCOSE-CAPILLARY: 152 mg/dL — AB (ref 65–99)
GLUCOSE-CAPILLARY: 134 mg/dL — AB (ref 65–99)
GLUCOSE-CAPILLARY: 118 mg/dL — AB (ref 65–99)
Glucose-Capillary: 140 mg/dL — ABNORMAL HIGH (ref 65–99)
Glucose-Capillary: 124 mg/dL — ABNORMAL HIGH (ref 65–99)
Glucose-Capillary: 116 mg/dL — ABNORMAL HIGH (ref 65–99)

## 2015-06-30 DIAGNOSIS — R110 Nausea: Secondary | ICD-10-CM | POA: Diagnosis not present

## 2015-06-30 DIAGNOSIS — K209 Esophagitis, unspecified: Secondary | ICD-10-CM | POA: Diagnosis not present

## 2015-06-30 DIAGNOSIS — B370 Candidal stomatitis: Secondary | ICD-10-CM | POA: Diagnosis not present

## 2015-06-30 DIAGNOSIS — K2900 Acute gastritis without bleeding: Secondary | ICD-10-CM | POA: Diagnosis not present

## 2015-06-30 LAB — GLUCOSE, CAPILLARY
GLUCOSE-CAPILLARY: 119 mg/dL — AB (ref 65–99)
Glucose-Capillary: 116 mg/dL — ABNORMAL HIGH (ref 65–99)
Glucose-Capillary: 105 mg/dL — ABNORMAL HIGH (ref 65–99)
Glucose-Capillary: 123 mg/dL — ABNORMAL HIGH (ref 65–99)

## 2015-07-01 ENCOUNTER — Telehealth: Payer: Self-pay

## 2015-07-01 ENCOUNTER — Telehealth: Payer: Self-pay | Admitting: Internal Medicine

## 2015-07-01 LAB — GLUCOSE, CAPILLARY: Glucose-Capillary: 137 mg/dL — ABNORMAL HIGH (ref 65–99)

## 2015-07-01 NOTE — Telephone Encounter (Signed)
Thank you. Will follow up. 

## 2015-07-01 NOTE — Telephone Encounter (Signed)
Transition Care Management Follow-up Telephone Call   Date discharged? 07/01/15   How have you been since you were released from the hospital? I feel very weak. I am on tube feedings now because its still too hard to eat.  I am doing half way okay.   Do you understand why you were in the hospital? Yes I believe so.   Do you understand the discharge instructions? Yes I do.   Where were you discharged to? Home   Items Reviewed:  Medications reviewed: Yes  Allergies reviewed: Yes  Dietary changes reviewed: Liquid diet  Referrals reviewed: Yes   Functional Questionnaire:   Activities of Daily Living (ADLs):   She states they are independent in the following: Self feeding, toileting, grooming, bathing, dressing. States they require assistance with the following: Ambulating (uses walker), preparing meals, clothing and shower items.  Weak at times and home health to assist.   Any transportation issues/concerns?:  I don't think so. I should be able to get my niece to bring me.   Any patient concerns? I have some internal bleeding and they can't seem to tell where it is coming from.   Confirmed importance and date/time of follow-up visits scheduled Yes, appointment scheduled 07/03/15.  Provider Appointment booked with Dr. Derrel Nip (PCP).  Confirmed with patient if condition begins to worsen call PCP or go to the ER.  Patient was given the office number and encouraged to call back with question or concerns.  : Yes, patient verbalized understanding.

## 2015-07-01 NOTE — Telephone Encounter (Signed)
Pt called stating that she needs a hospital follow up for stomach surgery.. Pt also states that the surgeon wanted her to follow up with her pcp.. Please advise pt

## 2015-07-02 DIAGNOSIS — Z7952 Long term (current) use of systemic steroids: Secondary | ICD-10-CM | POA: Diagnosis not present

## 2015-07-02 DIAGNOSIS — Z86718 Personal history of other venous thrombosis and embolism: Secondary | ICD-10-CM | POA: Diagnosis not present

## 2015-07-02 DIAGNOSIS — I5030 Unspecified diastolic (congestive) heart failure: Secondary | ICD-10-CM | POA: Diagnosis not present

## 2015-07-02 DIAGNOSIS — K922 Gastrointestinal hemorrhage, unspecified: Secondary | ICD-10-CM | POA: Diagnosis not present

## 2015-07-02 DIAGNOSIS — M3500 Sicca syndrome, unspecified: Secondary | ICD-10-CM | POA: Diagnosis not present

## 2015-07-02 DIAGNOSIS — G609 Hereditary and idiopathic neuropathy, unspecified: Secondary | ICD-10-CM | POA: Diagnosis not present

## 2015-07-02 DIAGNOSIS — E119 Type 2 diabetes mellitus without complications: Secondary | ICD-10-CM | POA: Diagnosis not present

## 2015-07-02 DIAGNOSIS — M3213 Lung involvement in systemic lupus erythematosus: Secondary | ICD-10-CM | POA: Diagnosis not present

## 2015-07-02 DIAGNOSIS — I10 Essential (primary) hypertension: Secondary | ICD-10-CM | POA: Diagnosis not present

## 2015-07-02 DIAGNOSIS — I4891 Unspecified atrial fibrillation: Secondary | ICD-10-CM | POA: Diagnosis not present

## 2015-07-02 DIAGNOSIS — Z431 Encounter for attention to gastrostomy: Secondary | ICD-10-CM | POA: Diagnosis not present

## 2015-07-02 DIAGNOSIS — Z95828 Presence of other vascular implants and grafts: Secondary | ICD-10-CM | POA: Diagnosis not present

## 2015-07-02 DIAGNOSIS — B370 Candidal stomatitis: Secondary | ICD-10-CM | POA: Diagnosis not present

## 2015-07-03 ENCOUNTER — Telehealth: Payer: Self-pay | Admitting: Internal Medicine

## 2015-07-03 ENCOUNTER — Ambulatory Visit (INDEPENDENT_AMBULATORY_CARE_PROVIDER_SITE_OTHER): Payer: Medicare Other | Admitting: Internal Medicine

## 2015-07-03 ENCOUNTER — Encounter: Payer: Self-pay | Admitting: Internal Medicine

## 2015-07-03 VITALS — BP 102/58 | HR 97 | Temp 97.8°F | Resp 12 | Ht 63.0 in | Wt 126.2 lb

## 2015-07-03 DIAGNOSIS — L89151 Pressure ulcer of sacral region, stage 1: Secondary | ICD-10-CM

## 2015-07-03 DIAGNOSIS — K922 Gastrointestinal hemorrhage, unspecified: Secondary | ICD-10-CM | POA: Diagnosis not present

## 2015-07-03 DIAGNOSIS — IMO0001 Reserved for inherently not codable concepts without codable children: Secondary | ICD-10-CM

## 2015-07-03 DIAGNOSIS — B370 Candidal stomatitis: Secondary | ICD-10-CM | POA: Diagnosis not present

## 2015-07-03 DIAGNOSIS — Z431 Encounter for attention to gastrostomy: Secondary | ICD-10-CM | POA: Diagnosis not present

## 2015-07-03 DIAGNOSIS — Z8781 Personal history of (healed) traumatic fracture: Secondary | ICD-10-CM | POA: Insufficient documentation

## 2015-07-03 DIAGNOSIS — E46 Unspecified protein-calorie malnutrition: Secondary | ICD-10-CM

## 2015-07-03 DIAGNOSIS — M8448XS Pathological fracture, other site, sequela: Secondary | ICD-10-CM

## 2015-07-03 DIAGNOSIS — E785 Hyperlipidemia, unspecified: Secondary | ICD-10-CM

## 2015-07-03 DIAGNOSIS — M4856XS Collapsed vertebra, not elsewhere classified, lumbar region, sequela of fracture: Secondary | ICD-10-CM

## 2015-07-03 DIAGNOSIS — E119 Type 2 diabetes mellitus without complications: Secondary | ICD-10-CM

## 2015-07-03 DIAGNOSIS — L89301 Pressure ulcer of unspecified buttock, stage 1: Secondary | ICD-10-CM | POA: Diagnosis not present

## 2015-07-03 DIAGNOSIS — R112 Nausea with vomiting, unspecified: Secondary | ICD-10-CM

## 2015-07-03 DIAGNOSIS — G609 Hereditary and idiopathic neuropathy, unspecified: Secondary | ICD-10-CM | POA: Diagnosis not present

## 2015-07-03 DIAGNOSIS — E870 Hyperosmolality and hypernatremia: Secondary | ICD-10-CM

## 2015-07-03 DIAGNOSIS — D518 Other vitamin B12 deficiency anemias: Secondary | ICD-10-CM

## 2015-07-03 DIAGNOSIS — I82501 Chronic embolism and thrombosis of unspecified deep veins of right lower extremity: Secondary | ICD-10-CM | POA: Diagnosis not present

## 2015-07-03 DIAGNOSIS — M3213 Lung involvement in systemic lupus erythematosus: Secondary | ICD-10-CM | POA: Diagnosis not present

## 2015-07-03 DIAGNOSIS — D509 Iron deficiency anemia, unspecified: Secondary | ICD-10-CM

## 2015-07-03 DIAGNOSIS — R531 Weakness: Secondary | ICD-10-CM | POA: Insufficient documentation

## 2015-07-03 LAB — COMPREHENSIVE METABOLIC PANEL
ALBUMIN: 2.9 g/dL — AB (ref 3.5–5.2)
ALK PHOS: 104 U/L (ref 39–117)
ALT: 17 U/L (ref 0–35)
AST: 30 U/L (ref 0–37)
BILIRUBIN TOTAL: 0.4 mg/dL (ref 0.2–1.2)
BUN: 26 mg/dL — AB (ref 6–23)
CALCIUM: 9 mg/dL (ref 8.4–10.5)
CHLORIDE: 104 meq/L (ref 96–112)
CO2: 25 mEq/L (ref 19–32)
CREATININE: 0.99 mg/dL (ref 0.40–1.20)
GFR: 57.58 mL/min — ABNORMAL LOW (ref >60.00)
Glucose, Bld: 125 mg/dL — ABNORMAL HIGH (ref 70–99)
Potassium: 4.3 mEq/L (ref 3.5–5.1)
SODIUM: 141 meq/L (ref 135–145)
TOTAL PROTEIN: 6.2 g/dL (ref 6.0–8.3)

## 2015-07-03 LAB — CBC WITH DIFFERENTIAL/PLATELET
Basophils Absolute: 0.1 10*3/uL (ref 0.0–0.1)
Basophils Relative: 0.6 % (ref 0.0–3.0)
Eosinophils Absolute: 0.2 10*3/uL (ref 0.0–0.7)
Eosinophils Relative: 1.7 % (ref 0.0–5.0)
HEMATOCRIT: 33.3 % — AB (ref 36.0–46.0)
HEMOGLOBIN: 10.4 g/dL — AB (ref 12.0–15.0)
Lymphocytes Relative: 12.6 % (ref 12.0–46.0)
Lymphs Abs: 1.3 10*3/uL (ref 0.7–4.0)
MCHC: 31.3 g/dL (ref 30.0–36.0)
MCV: 87.8 fl (ref 78.0–100.0)
MONOS PCT: 7.4 % (ref 3.0–12.0)
Monocytes Absolute: 0.8 10*3/uL (ref 0.1–1.0)
NEUTROS ABS: 8.2 10*3/uL — AB (ref 1.4–7.7)
Neutrophils Relative %: 77.7 % — ABNORMAL HIGH (ref 43.0–77.0)
Platelets: 230 10*3/uL (ref 150.0–400.0)
RBC: 3.8 Mil/uL — ABNORMAL LOW (ref 3.87–5.11)
RDW: 20.2 % — AB (ref 11.5–15.5)
WBC: 10.6 10*3/uL — ABNORMAL HIGH (ref 4.0–10.5)

## 2015-07-03 LAB — HEMOGLOBIN A1C: HEMOGLOBIN A1C: 5.9 % (ref 4.6–6.5)

## 2015-07-03 LAB — LDL CHOLESTEROL, DIRECT: LDL DIRECT: 28 mg/dL

## 2015-07-03 MED ORDER — DICLOFENAC EPOLAMINE 1.3 % TD PTCH: 1.0000 | MEDICATED_PATCH | Freq: Two times a day (BID) | TRANSDERMAL | Status: DC

## 2015-07-03 NOTE — Assessment & Plan Note (Signed)
In the setting of moderate protein malnutrition, and increased sedentary lifestyle. she needs an egg crate  mattress pad and wheelchair seat pad

## 2015-07-03 NOTE — Progress Notes (Addendum)
Subjective:  Patient ID: Diana [name], female    DOB: 29-Aug-1935  Age: 80 y.o. MRN: [id number]  CC: The primary encounter diagnosis was ANEMIA, B12 DEFICIENCY. Diagnoses of Diabetes mellitus without complication (HCC), Hypernatremia, Protein-calorie malnutrition (HCC), Decubitus ulcer of buttock, stage 1, unspecified laterality, Decubitus ulcer of sacral region, stage 1, DVT, recurrent, lower extremity, chronic, right (HCC), Non-intractable vomiting with nausea, unspecified vomiting type, Non-traumatic compression fracture of L2 lumbar vertebra, sequela, Generalized weakness, and Anemia, iron deficiency were also pertinent to this visit.  HPI [name] presents for HOSPITAL FOLLOW UP.  Patient was admitted to [facility] on Sept 19 th with a GI Bleed complicated by an acute DVT  and discharged on Sept 24 to [facility] for skilled nursing and rehab  She was  Released home on  Oct 12.  She livers with her son Dannielle Huh, who works full time.  A niece has been staying with her during the transition to home and notes that patient is unable to enter and exit the bathtub safely and therefore needs assistance with bathing.   During hospitalization she underwent an ENT and GI evaluation with EGD which noted severe esophagitis secondary to candida.  PEG tube feedings were continued and 150 ml free water flushes were added to manage hypernatremia.  Fluconazole was continued and nystatin Oral was added per ENT.  An  IVC filter was placed due to recurrent DVT in RLE in setting of GI Bleed.  Dr Wyn Quaker  Did the placement.   "I'm still here." Patient is seated in a wheelchair. She has been weakened by her malnutrition and other health issues and now has persistent back pain from  Prior T7 vertebral fracture.   Her pain has been controlled with Flector transdermal patches but her insurance has declined to pay.  She has vicodin at home form prior surgery and has consdired using it .  She requires assistance in getting up from  chair and requires frequent position changes due to pain . She has poor balance and is unable to walk without assistance.    Her hgb had dropped to 9.3 but improved from 9.3 to 10.0  Last check  oct 4   She has lost 10 lbs this year , is underweight and her Albumin  was 2.2   She has excessive sinus secretions that cause her to gag and cough and make her nauseated when swallowed.  She has developed an intolerance to dairy products and has vomited after cereal with mild and after yogurt.   ENT  Has no solution ,  The atrovent nasal spray has not helped.   Her niece has noted persistent redness to her bottom.  She is sleeping on a regular mattress with no pad.  I believe she will need an egg crate  Mattress pad and a pad for chair.  [facility] has been supervising transition to home and providing PT .  She would benefit from a CNA to help her bathe and prevent a fall while at home.  A Wheelchair with an anti tipping device, brake extensions ,seat and back cushions to prevent progression of her sacral decubitus ulcer ,  And elevating leg rests are needed to prevent deterioration of condition and prevent falls.      Outpatient Prescriptions Prior to Visit  Medication Sig Dispense Refill  . acetaminophen (TYLENOL) 325 MG tablet Take 650 mg by mouth every 4 (four) hours as needed for mild pain or fever.    Marland Kitchen  ALPRAZolam (XANAX) 0.25 MG tablet Take 1 tablet (0.25 mg total) by mouth at bedtime as needed for anxiety. 20 tablet 0  . Alum & Mag Hydroxide-Simeth (GI COCKTAIL) SUSP suspension Take 30 mLs by mouth every 6 (six) hours as needed (for severe reflux). Also has Lidocaine in it.  Shake well.    Marland Kitchen alum hydroxide-mag trisilicate (GAVISCON) 80-20 MG CHEW chewable tablet Chew 2 tablets by mouth 4 (four) times daily as needed for indigestion or heartburn.    Marland Kitchen azelastine (ASTELIN) 0.1 % nasal spray Place 1 spray into both nostrils 2 (two) times daily.    . cholecalciferol (VITAMIN D) 400 UNITS TABS  tablet Take 400 Units by mouth daily.    . cyanocobalamin (,VITAMIN B-12,) 1000 MCG/ML injection Inject 1 mL (1,000 mcg total) into the muscle every 30 (thirty) days. (Patient taking differently: Inject 1,000 mcg into the muscle every 30 (thirty) days. Pt uses on the 1st of every month.) 10 mL 1  . cyclobenzaprine (FLEXERIL) 10 MG tablet Take 10 mg by mouth 3 (three) times daily as needed for muscle spasms.    . ferrous sulfate 220 (44 FE) MG/5ML solution Take 220 mg by mouth 3 (three) times daily with meals.    . fluticasone (FLONASE) 50 MCG/ACT nasal spray Place 2 sprays into both nostrils daily as needed for rhinitis.    . Fluticasone-Salmeterol (ADVAIR) 250-50 MCG/DOSE AEPB Inhale 1 puff into the lungs 2 (two) times daily.    . hydroxychloroquine (PLAQUENIL) 200 MG tablet Take 200 mg by mouth daily.    Marland Kitchen ipratropium (ATROVENT) 0.03 % nasal spray Place 2 sprays into both nostrils 3 (three) times daily.    Marland Kitchen letrozole (FEMARA) 2.5 MG tablet Take 2.5 mg by mouth daily.    Marland Kitchen levocetirizine (XYZAL) 5 MG tablet Take 5 mg by mouth daily.    . mycophenolate (CELLCEPT) 250 MG capsule Take 1,000 mg by mouth 2 (two) times daily.    . Nutritional Supplements (FEEDING SUPPLEMENT, JEVITY 1.5 CAL/FIBER,) LIQD Place 1,000 mLs into feeding tube continuous. 30000 mL 1  . nystatin (MYCOSTATIN) 100000 UNIT/ML suspension Take 5 mLs (500,000 Units total) by mouth 4 (four) times daily. 60 mL 0  . ondansetron (ZOFRAN-ODT) 4 MG disintegrating tablet Take 1 tablet (4 mg total) by mouth every 4 (four) hours as needed for nausea or vomiting. 20 tablet 0  . oxyCODONE-acetaminophen (PERCOCET/ROXICET) 5-325 MG per tablet Take 1 tablet by mouth every 6 (six) hours as needed for severe pain. Pt is able to be given PRN doses in addition to scheduled doses. 20 tablet 0  . pantoprazole sodium (PROTONIX) 40 mg/20 mL PACK Place 40 mg into feeding tube 2 (two) times daily.    . predniSONE (DELTASONE) 1 MG tablet Take 1 mg by mouth  daily. Pt takes in addition to the 5mg  tablet.    . predniSONE (DELTASONE) 5 MG tablet Take 5 mg by mouth daily. Pt takes in addition to the 1mg  tablet.    . promethazine (PHENERGAN) 25 MG suppository Place 25 mg rectally every 4 (four) hours as needed for nausea or vomiting.    . promethazine (PHENERGAN) 25 MG tablet Take 6.25-12.5 mg by mouth every 4 (four) hours as needed for nausea or vomiting.     . promethazine (PHENERGAN) 25 MG/ML injection Inject 25 mg into the muscle every 4 (four) hours as needed for nausea or vomiting.    Marland Kitchen scopolamine (TRANSDERM-SCOP) 1 MG/3DAYS Place 1 patch onto the skin every 3 (three)  days.    Marland Kitchen SYNTHROID 150 MCG tablet Take 1 tablet (150 mcg total) by mouth daily. 30 tablet 11  . Tadalafil, PAH, (ADCIRCA) 20 MG TABS Take 40 mg by mouth daily.    . vitamin C (ASCORBIC ACID) 500 MG tablet Take 1,000 mg by mouth daily.    . Water For Irrigation, Sterile (FREE WATER) SOLN Place 150 mLs into feeding tube 2 (two) times daily. 1500 mL 2  . Water For Irrigation, Sterile (FREE WATER) SOLN Place 25 mLs into feeding tube continuous. 1000 mL 2  . Water For Irrigation, Sterile (FREE WATER) SOLN Place 150 mLs into feeding tube 2 (two) times daily. 1500 mL 2  . gabapentin (NEURONTIN) 400 MG capsule Take 400 mg by mouth daily as needed (for nerve pain).    Marland Kitchen loperamide (IMODIUM) 2 MG capsule Take 2-4 mg by mouth as needed for diarrhea or loose stools.    . baclofen (LIORESAL) 10 MG tablet Take 10 mg by mouth 2 (two) times daily.    . diclofenac (FLECTOR) 1.3 % PTCH Place 1 patch onto the skin 2 (two) times daily.     No facility-administered medications prior to visit.    Review of Systems;  Patient denies headache, fevers, malaise, unintentional weight loss, skin rash, eye pain, sinus congestion and sinus pain, sore throat, dysphagia,  hemoptysis , cough, dyspnea, wheezing, chest pain, palpitations, orthopnea, edema, abdominal pain, nausea, melena, diarrhea, constipation,  flank pain, dysuria, hematuria, urinary  Frequency, nocturia, numbness, tingling, seizures,  Focal weakness, Loss of consciousness,  Tremor, insomnia, depression, anxiety, and suicidal ideation.      Objective:  BP 102/58 mmHg  Pulse 97  Temp(Src) 97.8 F (36.6 C) (Oral)  Resp 12  Ht 5\' 3"  (1.6 m)  Wt 126 lb 4 oz (57.267 kg)  BMI 22.37 kg/m2  SpO2 97%  BP Readings from Last 3 Encounters:  07/03/15 102/58  06/13/15 109/72  06/04/15 114/59    Wt Readings from Last 3 Encounters:  07/03/15 126 lb 4 oz (57.267 kg)  06/10/15 124 lb (56.246 kg)  04/13/15 129 lb (58.514 kg)    General appearance: alert, cooperative and appears stated age.   Neck: no adenopathy, no carotid bruit, supple, symmetrical, trachea midline and thyroid not enlarged, symmetric, no tenderness/mass/nodules Back: symmetric, kyphotic . ROM restricted secondary to T7 pain . No CVA tenderness. Lungs: clear to auscultation bilaterally Heart: regular rate and rhythm, S1, S2 normal, no murmur, click, rub or gallop Abdomen: soft, non-tender; bowel sounds normal; PEG tube in place, no erythema. no masses,  no organomegaly Pulses: 2+ and symmetric Skin: Blanching erythema of both buttocks, no skin breakdown. Other wise Skin color pale , turgor normal. No rashes or lesions Lymph nodes: Cervical, supraclavicular, and axillary nodes normal.  Lab Results  Component Value Date   HGBA1C 5.9 07/03/2015   HGBA1C 7.1* 11/19/2014   HGBA1C 6.9* 04/22/2014    Lab Results  Component Value Date   CREATININE 0.99 07/03/2015   CREATININE 0.93 06/23/2015   CREATININE 1.02* 06/18/2015    Lab Results  Component Value Date   WBC 10.6* 07/03/2015   HGB 10.4* 07/03/2015   HCT 33.3* 07/03/2015   PLT 230.0 07/03/2015   GLUCOSE 125* 07/03/2015   CHOL 164 10/11/2013   TRIG 152.0* 10/11/2013   HDL 69.70 10/11/2013   LDLDIRECT 28.0 07/03/2015   LDLCALC 64 10/11/2013   ALT 17 07/03/2015   AST 30 07/03/2015   NA 141 07/03/2015     K  4.3 07/03/2015   CL 104 07/03/2015   CREATININE 0.99 07/03/2015   BUN 26* 07/03/2015   CO2 25 07/03/2015   TSH 2.916 04/01/2015   INR 1.22 06/10/2015   HGBA1C 5.9 07/03/2015   MICROALBUR <0.7 11/28/2014    No results found.  Assessment & Plan:   Problem List Items Addressed This Visit    Generalized weakness    Secondary to L2 compression fracture causing chronic low back pain , protein malnutrition secondary to severe esophagitis, resulting in loss of muscle mass. She has difficulty entering and exiting tub and is high risk for fall. Ordering HH to send CNA for assistance with daily activities including bathing.  A Wheelchair with an anti tipping device, brake extensions ,seat and back cushions to prevent progression of her sacral decubitus ulcer ,  And elevating leg rests are needed to prevent deterioration of condition and prevent falls.  Wheelchair has been ordered.       Relevant Orders   Ambulatory referral to Home Health   Diabetes mellitus without complication [facility])    Reviewed hospital CBGs which were largely normal.  Her DM is diet controlled, follow up labs needed .  Lab Results  Component Value Date   HGBA1C 7.1* 11/19/2014         Relevant Orders   Comprehensive metabolic panel (Completed)   Hemoglobin A1c (Completed)   LDL cholesterol, direct (Completed)   Comprehensive metabolic panel   DVT, recurrent, lower extremity, chronic (HCC)    S/p iVC filter placement by Festus Barren .  Not a candidate for resuming coumadin due to anemia from GI losses.  Will hve her follow up with him in 3 months       Nausea with vomiting    Recurrent,  secondaryt o esophagitis with increased secretions and new onset intolerance for dairy,       Protein-calorie malnutrition (HCC)    Managed with Jevity tube feeds 1.5 cal/fiber .  Recommended trial of almond milk by mouth with protein concentrate added.       Relevant Orders   Comprehensive metabolic panel (Completed)    For home use only DME Other see comment   Hypernatremia    Secondary to dehydration due to decreased po intake from esophagitis.  PEG tube flushes with 150 ml water q 6 hours.       Relevant Orders   Comprehensive metabolic panel   Decubitus ulcer of sacral region, stage 1    In the setting of moderate protein malnutrition, and increased sedentary lifestyle. she needs an egg crate  mattress pad and wheelchair seat pad       Non-traumatic compression fracture of L2 lumbar vertebra    PA for flector in process .  Had kyphoplasty in April prior to new onset pain and new fracture.  DEXA ordered by me in March  but not done.  Reordered by her pulmonologist       Anemia, iron deficiency    Secondary to GI losses with PEG tube in place for severe candida esophagitis with nausea /vomiting.  Checking hgb today, iron started during hospitalization.  Hgb was 10 at discharge on Oct 4         ANEMIA, B12 DEFICIENCY - Primary   Relevant Orders   CBC with Differential/Platelet (Completed)      I have discontinued Ms. Droll's baclofen. I am also having her maintain her cyanocobalamin, SYNTHROID, fluticasone, hydroxychloroquine, letrozole, Tadalafil (PAH), ondansetron, acetaminophen, Fluticasone-Salmeterol, loperamide, vitamin C, azelastine, cyclobenzaprine, ferrous  sulfate, gabapentin, alum hydroxide-mag trisilicate, gi cocktail, ipratropium, levocetirizine, mycophenolate, promethazine, promethazine, promethazine, predniSONE, predniSONE, pantoprazole sodium, scopolamine, cholecalciferol, nystatin, feeding supplement (JEVITY 1.5 CAL/FIBER), oxyCODONE-acetaminophen, ALPRAZolam, free water, free water, free water, and diclofenac.  Meds ordered this encounter  Medications  . diclofenac (FLECTOR) 1.3 % PTCH    Sig: Place 1 patch onto the skin 2 (two) times daily.    Dispense:  60 patch    Refill:  1    PRIOR AUTHORIZATION REQUESTED    Medications Discontinued During This Encounter  Medication  Reason  . baclofen (LIORESAL) 10 MG tablet Patient Preference  . diclofenac (FLECTOR) 1.3 % PTCH Reorder  . diclofenac (FLECTOR) 1.3 % PTCH Reorder    Follow-up: No Follow-up on file.   Sherlene Shams, MD

## 2015-07-03 NOTE — Assessment & Plan Note (Addendum)
Secondary to L2 compression fracture causing chronic low back pain , protein malnutrition secondary to severe esophagitis, resulting in loss of muscle mass. She has difficulty entering and exiting tub and is high risk for fall. Ordering HH to send CNA for assistance with daily activities including bathing.  A Wheelchair with an anti tipping device, brake extensions ,seat and back cushions to prevent progression of her sacral decubitus ulcer ,  And elevating leg rests are needed to prevent deterioration of condition and prevent falls.  Wheelchair has been ordered.

## 2015-07-03 NOTE — Assessment & Plan Note (Addendum)
Managed with Jevity tube feeds 1.5 cal/fiber .  Recommended trial of almond milk by mouth with protein concentrate added.

## 2015-07-03 NOTE — Assessment & Plan Note (Addendum)
PA for flector in process .  Had kyphoplasty in April prior to new onset pain and new fracture.  DEXA ordered by me in March  but not done.  Reordered by her pulmonologist

## 2015-07-03 NOTE — Patient Instructions (Addendum)
I will order a mattress and a seat pad, and a CNA  through [facility]   You may want to try the "Orgain"  Brand of organic almond milk because it has more protein (10 mg per 8 ounce, compared to 1 gram/8 ounce) than the other brands of almond milk . It has the same amount of calcium as a glass of milk and is cholesterol free and low calorie.  Its available at [facility] and at the [employer] can also add vegetarian protein power to it to help regain the protein you lost   We will schedule you to see Dr Lucky Cowboy in 3 months about the IVC filter

## 2015-07-03 NOTE — Assessment & Plan Note (Signed)
Secondary to GI losses with PEG tube in place for severe candida esophagitis with nausea /vomiting.  Checking hgb today, iron started during hospitalization.  Hgb was 10 at discharge on Oct 4

## 2015-07-03 NOTE — Assessment & Plan Note (Signed)
Recurrent,  secondaryt o esophagitis with increased secretions and new onset intolerance for dairy,

## 2015-07-03 NOTE — Assessment & Plan Note (Signed)
Reviewed hospital CBGs which were largely normal.  Her DM is diet controlled, follow up labs needed .  Lab Results  Component Value Date   HGBA1C 7.1* 11/19/2014

## 2015-07-03 NOTE — Telephone Encounter (Signed)
Faxed orders for mattress and wheelchair cushion to [facility]. PA started for Flector patch.

## 2015-07-03 NOTE — Assessment & Plan Note (Signed)
S/p iVC filter placement by Leotis Pain .  Not a candidate for resuming coumadin due to anemia from GI losses.  Will hve her follow up with him in 3 months

## 2015-07-03 NOTE — Progress Notes (Signed)
Pre-visit discussion using our clinic review tool. No additional management support is needed unless otherwise documented below in the visit note.  

## 2015-07-03 NOTE — Assessment & Plan Note (Signed)
Secondary to dehydration due to decreased po intake from esophagitis.  PEG tube flushes with 150 ml water q 6 hours.

## 2015-07-06 DIAGNOSIS — M3213 Lung involvement in systemic lupus erythematosus: Secondary | ICD-10-CM | POA: Diagnosis not present

## 2015-07-06 DIAGNOSIS — Z431 Encounter for attention to gastrostomy: Secondary | ICD-10-CM | POA: Diagnosis not present

## 2015-07-06 DIAGNOSIS — B370 Candidal stomatitis: Secondary | ICD-10-CM | POA: Diagnosis not present

## 2015-07-06 DIAGNOSIS — G609 Hereditary and idiopathic neuropathy, unspecified: Secondary | ICD-10-CM | POA: Diagnosis not present

## 2015-07-06 DIAGNOSIS — E119 Type 2 diabetes mellitus without complications: Secondary | ICD-10-CM | POA: Diagnosis not present

## 2015-07-06 DIAGNOSIS — K922 Gastrointestinal hemorrhage, unspecified: Secondary | ICD-10-CM | POA: Diagnosis not present

## 2015-07-07 ENCOUNTER — Telehealth: Payer: Self-pay

## 2015-07-07 ENCOUNTER — Telehealth: Payer: Self-pay | Admitting: *Deleted

## 2015-07-07 DIAGNOSIS — M3213 Lung involvement in systemic lupus erythematosus: Secondary | ICD-10-CM | POA: Diagnosis not present

## 2015-07-07 DIAGNOSIS — G609 Hereditary and idiopathic neuropathy, unspecified: Secondary | ICD-10-CM | POA: Diagnosis not present

## 2015-07-07 DIAGNOSIS — B370 Candidal stomatitis: Secondary | ICD-10-CM | POA: Diagnosis not present

## 2015-07-07 DIAGNOSIS — Z431 Encounter for attention to gastrostomy: Secondary | ICD-10-CM | POA: Diagnosis not present

## 2015-07-07 DIAGNOSIS — E119 Type 2 diabetes mellitus without complications: Secondary | ICD-10-CM | POA: Diagnosis not present

## 2015-07-07 DIAGNOSIS — K922 Gastrointestinal hemorrhage, unspecified: Secondary | ICD-10-CM | POA: Diagnosis not present

## 2015-07-07 NOTE — Telephone Encounter (Signed)
Agree but in the future try to talk her into seeing Dr Caryl Bis.  Her medical history is too complicated to send to Urgent Care where they may not have access to her records.

## 2015-07-07 NOTE — Telephone Encounter (Signed)
Fever 101.6 today, yesterday it was low grade 99.0, diarrhea yesterday, but on tube feeds.  Nauseated, not tolerating fluids.  Suggested a Urgent care as she has progressed since yesterday with no appointments available on your schedule.    [phone] Amy

## 2015-07-10 ENCOUNTER — Telehealth: Payer: Self-pay | Admitting: *Deleted

## 2015-07-10 DIAGNOSIS — M3213 Lung involvement in systemic lupus erythematosus: Secondary | ICD-10-CM | POA: Diagnosis not present

## 2015-07-10 DIAGNOSIS — Z431 Encounter for attention to gastrostomy: Secondary | ICD-10-CM | POA: Diagnosis not present

## 2015-07-10 DIAGNOSIS — E119 Type 2 diabetes mellitus without complications: Secondary | ICD-10-CM | POA: Diagnosis not present

## 2015-07-10 DIAGNOSIS — B370 Candidal stomatitis: Secondary | ICD-10-CM | POA: Diagnosis not present

## 2015-07-10 DIAGNOSIS — K922 Gastrointestinal hemorrhage, unspecified: Secondary | ICD-10-CM | POA: Diagnosis not present

## 2015-07-10 DIAGNOSIS — G609 Hereditary and idiopathic neuropathy, unspecified: Secondary | ICD-10-CM | POA: Diagnosis not present

## 2015-07-10 MED ORDER — ONDANSETRON 4 MG PO TBDP: 4.0000 mg | ORAL_TABLET | ORAL | Status: DC | PRN

## 2015-07-10 NOTE — Telephone Encounter (Signed)
Ok to refill,  Refill sent  

## 2015-07-10 NOTE — Telephone Encounter (Signed)
Pt left vm on Triage line requesting refill on Zofran,  This medication was given to pt by Dr Bary Castilla, Faythe Ghee to refill?

## 2015-07-14 DIAGNOSIS — M3213 Lung involvement in systemic lupus erythematosus: Secondary | ICD-10-CM | POA: Diagnosis not present

## 2015-07-14 DIAGNOSIS — K922 Gastrointestinal hemorrhage, unspecified: Secondary | ICD-10-CM | POA: Diagnosis not present

## 2015-07-14 DIAGNOSIS — B370 Candidal stomatitis: Secondary | ICD-10-CM | POA: Diagnosis not present

## 2015-07-14 DIAGNOSIS — G609 Hereditary and idiopathic neuropathy, unspecified: Secondary | ICD-10-CM | POA: Diagnosis not present

## 2015-07-14 DIAGNOSIS — E119 Type 2 diabetes mellitus without complications: Secondary | ICD-10-CM | POA: Diagnosis not present

## 2015-07-14 DIAGNOSIS — Z431 Encounter for attention to gastrostomy: Secondary | ICD-10-CM | POA: Diagnosis not present

## 2015-07-16 DIAGNOSIS — K922 Gastrointestinal hemorrhage, unspecified: Secondary | ICD-10-CM | POA: Diagnosis not present

## 2015-07-16 DIAGNOSIS — E119 Type 2 diabetes mellitus without complications: Secondary | ICD-10-CM | POA: Diagnosis not present

## 2015-07-16 DIAGNOSIS — Z431 Encounter for attention to gastrostomy: Secondary | ICD-10-CM | POA: Diagnosis not present

## 2015-07-16 DIAGNOSIS — G609 Hereditary and idiopathic neuropathy, unspecified: Secondary | ICD-10-CM | POA: Diagnosis not present

## 2015-07-16 DIAGNOSIS — B370 Candidal stomatitis: Secondary | ICD-10-CM | POA: Diagnosis not present

## 2015-07-16 DIAGNOSIS — M3213 Lung involvement in systemic lupus erythematosus: Secondary | ICD-10-CM | POA: Diagnosis not present

## 2015-07-17 ENCOUNTER — Ambulatory Visit
Admission: RE | Admit: 2015-07-17 | Discharge: 2015-07-17 | Disposition: A | Discharge status: Home / Self Care | Payer: Medicare Other | Source: Ambulatory Visit | Attending: Gastroenterology | Admitting: Gastroenterology

## 2015-07-17 ENCOUNTER — Inpatient Hospital Stay
Admission: EM | Admit: 2015-07-17 | Discharge: 2015-07-20 | DRG: 377 | Disposition: A | Discharge status: Home / Self Care | Payer: Medicare Other | Attending: Internal Medicine | Admitting: Internal Medicine

## 2015-07-17 ENCOUNTER — Other Ambulatory Visit: Payer: Self-pay | Admitting: Gastroenterology

## 2015-07-17 ENCOUNTER — Encounter: Payer: Self-pay | Admitting: Emergency Medicine

## 2015-07-17 DIAGNOSIS — D649 Anemia, unspecified: Secondary | ICD-10-CM | POA: Diagnosis present

## 2015-07-17 DIAGNOSIS — Z882 Allergy status to sulfonamides status: Secondary | ICD-10-CM

## 2015-07-17 DIAGNOSIS — Z9889 Other specified postprocedural states: Secondary | ICD-10-CM | POA: Diagnosis not present

## 2015-07-17 DIAGNOSIS — K449 Diaphragmatic hernia without obstruction or gangrene: Secondary | ICD-10-CM | POA: Diagnosis present

## 2015-07-17 DIAGNOSIS — K208 Other esophagitis: Secondary | ICD-10-CM | POA: Diagnosis not present

## 2015-07-17 DIAGNOSIS — K5900 Constipation, unspecified: Secondary | ICD-10-CM

## 2015-07-17 DIAGNOSIS — Z811 Family history of alcohol abuse and dependence: Secondary | ICD-10-CM | POA: Diagnosis not present

## 2015-07-17 DIAGNOSIS — Z87891 Personal history of nicotine dependence: Secondary | ICD-10-CM | POA: Diagnosis not present

## 2015-07-17 DIAGNOSIS — Z803 Family history of malignant neoplasm of breast: Secondary | ICD-10-CM

## 2015-07-17 DIAGNOSIS — K921 Melena: Principal | ICD-10-CM | POA: Diagnosis present

## 2015-07-17 DIAGNOSIS — Z880 Allergy status to penicillin: Secondary | ICD-10-CM

## 2015-07-17 DIAGNOSIS — M329 Systemic lupus erythematosus, unspecified: Secondary | ICD-10-CM | POA: Diagnosis present

## 2015-07-17 DIAGNOSIS — I509 Heart failure, unspecified: Secondary | ICD-10-CM | POA: Diagnosis present

## 2015-07-17 DIAGNOSIS — K210 Gastro-esophageal reflux disease with esophagitis: Secondary | ICD-10-CM | POA: Diagnosis present

## 2015-07-17 DIAGNOSIS — I270 Primary pulmonary hypertension: Secondary | ICD-10-CM | POA: Diagnosis present

## 2015-07-17 DIAGNOSIS — D62 Acute posthemorrhagic anemia: Secondary | ICD-10-CM | POA: Diagnosis present

## 2015-07-17 DIAGNOSIS — R112 Nausea with vomiting, unspecified: Secondary | ICD-10-CM

## 2015-07-17 DIAGNOSIS — K209 Esophagitis, unspecified: Secondary | ICD-10-CM | POA: Diagnosis not present

## 2015-07-17 DIAGNOSIS — Z6823 Body mass index (BMI) 23.0-23.9, adult: Secondary | ICD-10-CM | POA: Diagnosis not present

## 2015-07-17 DIAGNOSIS — Z79899 Other long term (current) drug therapy: Secondary | ICD-10-CM | POA: Diagnosis not present

## 2015-07-17 DIAGNOSIS — Z888 Allergy status to other drugs, medicaments and biological substances status: Secondary | ICD-10-CM | POA: Diagnosis not present

## 2015-07-17 DIAGNOSIS — E039 Hypothyroidism, unspecified: Secondary | ICD-10-CM | POA: Diagnosis present

## 2015-07-17 DIAGNOSIS — Z800 Family history of malignant neoplasm of digestive organs: Secondary | ICD-10-CM | POA: Diagnosis not present

## 2015-07-17 DIAGNOSIS — E46 Unspecified protein-calorie malnutrition: Secondary | ICD-10-CM | POA: Diagnosis not present

## 2015-07-17 DIAGNOSIS — E43 Unspecified severe protein-calorie malnutrition: Secondary | ICD-10-CM | POA: Diagnosis present

## 2015-07-17 DIAGNOSIS — K2210 Ulcer of esophagus without bleeding: Secondary | ICD-10-CM | POA: Diagnosis present

## 2015-07-17 DIAGNOSIS — K922 Gastrointestinal hemorrhage, unspecified: Secondary | ICD-10-CM | POA: Diagnosis present

## 2015-07-17 DIAGNOSIS — Z801 Family history of malignant neoplasm of trachea, bronchus and lung: Secondary | ICD-10-CM | POA: Diagnosis not present

## 2015-07-17 DIAGNOSIS — Z931 Gastrostomy status: Secondary | ICD-10-CM

## 2015-07-17 DIAGNOSIS — K222 Esophageal obstruction: Secondary | ICD-10-CM | POA: Diagnosis present

## 2015-07-17 DIAGNOSIS — N179 Acute kidney failure, unspecified: Secondary | ICD-10-CM | POA: Diagnosis present

## 2015-07-17 DIAGNOSIS — F329 Major depressive disorder, single episode, unspecified: Secondary | ICD-10-CM | POA: Diagnosis present

## 2015-07-17 LAB — COMPREHENSIVE METABOLIC PANEL
ALBUMIN: 2.4 g/dL — AB (ref 3.5–5.0)
ALT: 10 U/L — ABNORMAL LOW (ref 14–54)
ANION GAP: 10 (ref 5–15)
AST: 25 U/L (ref 15–41)
Alkaline Phosphatase: 71 U/L (ref 38–126)
BUN: 45 mg/dL — AB (ref 6–20)
CO2: 29 mmol/L (ref 22–32)
Calcium: 8.1 mg/dL — ABNORMAL LOW (ref 8.9–10.3)
Chloride: 100 mmol/L — ABNORMAL LOW (ref 101–111)
Creatinine, Ser: 1.49 mg/dL — ABNORMAL HIGH (ref 0.44–1.00)
GFR calc Af Amer: 38 mL/min — ABNORMAL LOW (ref >60)
GFR calc non Af Amer: 32 mL/min — ABNORMAL LOW (ref >60)
GLUCOSE: 110 mg/dL — AB (ref 65–99)
POTASSIUM: 3.7 mmol/L (ref 3.5–5.1)
SODIUM: 139 mmol/L (ref 135–145)
Total Bilirubin: 0.6 mg/dL (ref 0.3–1.2)
Total Protein: 5.6 g/dL — ABNORMAL LOW (ref 6.5–8.1)

## 2015-07-17 LAB — URINALYSIS COMPLETE WITH MICROSCOPIC (ARMC ONLY)
BACTERIA UA: NONE SEEN
Bilirubin Urine: NEGATIVE
GLUCOSE, UA: NEGATIVE mg/dL
HGB URINE DIPSTICK: NEGATIVE
Ketones, ur: NEGATIVE mg/dL
NITRITE: NEGATIVE
PH: 6 (ref 5.0–8.0)
Protein, ur: NEGATIVE mg/dL
RBC / HPF: NONE SEEN RBC/hpf (ref 0–5)
Specific Gravity, Urine: 1.014 (ref 1.005–1.030)

## 2015-07-17 LAB — CBC
HCT: 24.1 % — ABNORMAL LOW (ref 35.0–47.0)
HEMOGLOBIN: 8 g/dL — AB (ref 12.0–16.0)
MCH: 29.2 pg (ref 26.0–34.0)
MCHC: 33.1 g/dL (ref 32.0–36.0)
MCV: 88.2 fL (ref 80.0–100.0)
Platelets: 379 10*3/uL (ref 150–440)
RBC: 2.73 MIL/uL — AB (ref 3.80–5.20)
RDW: 20.4 % — ABNORMAL HIGH (ref 11.5–14.5)
WBC: 8.9 10*3/uL (ref 3.6–11.0)

## 2015-07-17 LAB — PREPARE RBC (CROSSMATCH)

## 2015-07-17 MED ORDER — FLUTICASONE PROPIONATE 50 MCG/ACT NA SUSP: 2.0000 | Freq: Every day | NASAL | Status: DC | PRN

## 2015-07-17 MED ORDER — ACETAMINOPHEN 650 MG RE SUPP: 650.0000 mg | Freq: Four times a day (QID) | RECTAL | Status: DC | PRN

## 2015-07-17 MED ORDER — PANTOPRAZOLE SODIUM 40 MG IV SOLR
40.0000 mg | Freq: Once | INTRAVENOUS | Status: AC
  Administered 2015-07-17: 40 mg via INTRAVENOUS
  Filled 2015-07-17: qty 40

## 2015-07-17 MED ORDER — PANTOPRAZOLE SODIUM 40 MG IV SOLR
40.0000 mg | Freq: Two times a day (BID) | INTRAVENOUS | Status: DC
  Administered 2015-07-17 – 2015-07-20 (×6): 40 mg via INTRAVENOUS
  Filled 2015-07-17 (×6): qty 40

## 2015-07-17 MED ORDER — LEVOTHYROXINE SODIUM 75 MCG PO TABS
150.0000 ug | ORAL_TABLET | Freq: Every day | ORAL | Status: DC
  Administered 2015-07-19 – 2015-07-20 (×2): 150 ug via ORAL
  Filled 2015-07-17 (×2): qty 2

## 2015-07-17 MED ORDER — SUCRALFATE 1 GM/10ML PO SUSP
1.0000 g | Freq: Three times a day (TID) | ORAL | Status: DC
  Administered 2015-07-17 – 2015-07-20 (×6): 1 g via ORAL
  Filled 2015-07-17 (×7): qty 10

## 2015-07-17 MED ORDER — ALBUTEROL SULFATE (2.5 MG/3ML) 0.083% IN NEBU: 2.5000 mg | INHALATION_SOLUTION | RESPIRATORY_TRACT | Status: DC | PRN

## 2015-07-17 MED ORDER — ONDANSETRON HCL 4 MG/2ML IJ SOLN
INTRAMUSCULAR | Status: AC
  Administered 2015-07-17: 4 mg via INTRAVENOUS
  Filled 2015-07-17: qty 2

## 2015-07-17 MED ORDER — SODIUM CHLORIDE 0.9 % IV SOLN
10.0000 mL/h | Freq: Once | INTRAVENOUS | Status: AC
  Administered 2015-07-17: 10 mL/h via INTRAVENOUS

## 2015-07-17 MED ORDER — ONDANSETRON HCL 4 MG/2ML IJ SOLN
4.0000 mg | Freq: Once | INTRAMUSCULAR | Status: AC
  Administered 2015-07-17: 4 mg via INTRAVENOUS
  Filled 2015-07-17: qty 2

## 2015-07-17 MED ORDER — ONDANSETRON HCL 4 MG/2ML IJ SOLN
4.0000 mg | Freq: Four times a day (QID) | INTRAMUSCULAR | Status: DC | PRN
  Administered 2015-07-18 – 2015-07-20 (×6): 4 mg via INTRAVENOUS
  Filled 2015-07-17 (×5): qty 2

## 2015-07-17 MED ORDER — POTASSIUM CHLORIDE IN NACL 20-0.9 MEQ/L-% IV SOLN
INTRAVENOUS | Status: DC
  Administered 2015-07-17 – 2015-07-19 (×3): via INTRAVENOUS
  Filled 2015-07-17 (×5): qty 1000

## 2015-07-17 MED ORDER — ONDANSETRON HCL 4 MG/2ML IJ SOLN
4.0000 mg | Freq: Once | INTRAMUSCULAR | Status: AC
  Administered 2015-07-17: 4 mg via INTRAVENOUS

## 2015-07-17 MED ORDER — ALPRAZOLAM 0.25 MG PO TABS: 0.2500 mg | ORAL_TABLET | Freq: Every evening | ORAL | Status: DC | PRN

## 2015-07-17 MED ORDER — MORPHINE SULFATE (PF) 4 MG/ML IV SOLN
4.0000 mg | Freq: Once | INTRAVENOUS | Status: AC
  Administered 2015-07-17: 4 mg via INTRAVENOUS

## 2015-07-17 MED ORDER — MOMETASONE FURO-FORMOTEROL FUM 100-5 MCG/ACT IN AERO
2.0000 | INHALATION_SPRAY | Freq: Two times a day (BID) | RESPIRATORY_TRACT | Status: DC
  Administered 2015-07-17 – 2015-07-20 (×6): 2 via RESPIRATORY_TRACT
  Filled 2015-07-17: qty 8.8

## 2015-07-17 MED ORDER — AZELASTINE HCL 0.1 % NA SOLN
1.0000 | Freq: Two times a day (BID) | NASAL | Status: DC
  Administered 2015-07-17 – 2015-07-20 (×6): 1 via NASAL
  Filled 2015-07-17: qty 30

## 2015-07-17 MED ORDER — MORPHINE SULFATE (PF) 4 MG/ML IV SOLN
INTRAVENOUS | Status: AC
  Administered 2015-07-17: 4 mg via INTRAVENOUS
  Filled 2015-07-17: qty 1

## 2015-07-17 MED ORDER — CYCLOBENZAPRINE HCL 10 MG PO TABS: 10.0000 mg | ORAL_TABLET | Freq: Three times a day (TID) | ORAL | Status: DC | PRN

## 2015-07-17 MED ORDER — ACETAMINOPHEN 325 MG PO TABS: 650.0000 mg | ORAL_TABLET | Freq: Four times a day (QID) | ORAL | Status: DC | PRN

## 2015-07-17 MED ORDER — SUCRALFATE 1 G PO TABS
1.0000 g | ORAL_TABLET | Freq: Once | ORAL | Status: DC
  Filled 2015-07-17: qty 1

## 2015-07-17 MED ORDER — IPRATROPIUM BROMIDE 0.03 % NA SOLN
2.0000 | Freq: Three times a day (TID) | NASAL | Status: DC
  Administered 2015-07-17 – 2015-07-20 (×8): 2 via NASAL
  Filled 2015-07-17: qty 30

## 2015-07-17 MED ORDER — ONDANSETRON HCL 4 MG PO TABS: 4.0000 mg | ORAL_TABLET | Freq: Four times a day (QID) | ORAL | Status: DC | PRN

## 2015-07-17 NOTE — H&P (Signed)
[facility] - [place] at [facility]   PATIENT NAME: Barbara Vea    MR#:  [id number]  DATE OF BIRTH:  08-17-1935  DATE OF ADMISSION:  07/17/2015  PRIMARY CARE PHYSICIAN: Sherlene Shams, MD   REQUESTING/REFERRING PHYSICIAN: Dr. Mayford Knife  CHIEF COMPLAINT:   Chief Complaint  Patient presents with  . Weakness   melena and generalized weakness for one week  HISTORY OF PRESENT ILLNESS:  Emlynn Driskill  is a 80 y.o. female with a known history of erosive esophagitis, anemia, CHF, pulmonary hypertension and lupus. The patient was sent to the ED due to melena and generalized weakness. Past one week. She is alert, awake and oriented. She denies any chest pain, palpitation or shortness of breath. She denies any other active bleeding. Her hemoglobin decreased from 10.4 to 8.0 today.  PAST MEDICAL HISTORY:   Past Medical History  Diagnosis Date  . Lupus (systemic lupus erythematosus) (HCC)   . Pulmonary hypertension (HCC)   . Pneumonia   . Unspecified menopausal and postmenopausal disorder   . Primary pulmonary hypertension (HCC)   . Acute glomerulonephritis with other specified pathological lesion in kidney in disease classified elsewhere(580.81)   . Unspecified hypothyroidism   . Shingles (herpes zoster) polyneuropathy March 2013    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  . Hiatal hernia   . DVT (deep venous thrombosis) (HCC)   . Cancer (HCC)     Breast  . CHF (congestive heart failure) (HCC)   . Renal insufficiency     PAST SURGICAL HISTORY:   Past Surgical History  Procedure Laterality Date  . Kyphoplasty N/A 02/10/2015    Procedure: [id number];  Surgeon: Kennedy Bucker, MD;  Location: [facility] ORS;  Service: Orthopedics;  Laterality: N/A;  . Laparotomy N/A 03/11/2015    Procedure: REDUCTION OF GASTRIC VOLVULUS, SPLEENECTOMY, GASTRIC TUBE PLACEMENT;  Surgeon: Earline Mayotte, MD;  Location: [facility] ORS;  Service: General;  Laterality: N/A;  .  Esophagogastroduodenoscopy (egd) with propofol N/A 03/28/2015    Procedure: ESOPHAGOGASTRODUODENOSCOPY (EGD) WITH PROPOFOL;  Surgeon: Earline Mayotte, MD;  Location: [facility] ENDOSCOPY;  Service: Endoscopy;  Laterality: N/A;  . Esophagogastroduodenoscopy (egd) with propofol N/A 06/10/2015    Procedure: ESOPHAGOGASTRODUODENOSCOPY (EGD) WITH PROPOFOL;  Surgeon: Christena Deem, MD;  Location: [facility] ENDOSCOPY;  Service: Endoscopy;  Laterality: N/A;  . Peripheral vascular catheterization N/A 06/12/2015    Procedure: IVC Filter Insertion;  Surgeon: Annice Needy, MD;  Location: [facility] INVASIVE CV LAB;  Service: Cardiovascular;  Laterality: N/A;    SOCIAL HISTORY:   Social History  Substance Use Topics  . Smoking status: Former Smoker    Quit date: 05/25/1991  . Smokeless tobacco: Never Used  . Alcohol Use: Yes     Comment: rare    FAMILY HISTORY:   Family History  Problem Relation Age of Onset  . Colon cancer Mother   . Alcohol abuse Sister   . Breast cancer Sister   . Cancer Brother   . Lung cancer Son     DRUG ALLERGIES:   Allergies  Allergen Reactions  . Amoxicillin Other (See Comments)    Reaction:  Stomach cramps   . Cefuroxime Itching  . Contrast Media [Iodinated Diagnostic Agents] Hives  . Morphine And Related     Reaction: face flushed    . Cephalexin Rash  . Sulfonamide Derivatives Rash  . Tramadol Hcl Rash  . Venlafaxine Rash    REVIEW OF SYSTEMS:  CONSTITUTIONAL: No fever, but has generalized weakness.  EYES:  No blurred or double vision.  EARS, NOSE, AND THROAT: No tinnitus or ear pain.  RESPIRATORY: No cough, shortness of breath, wheezing or hemoptysis.  CARDIOVASCULAR: No chest pain, orthopnea, edema.  GASTROINTESTINAL: Has nausea and epigastric pain, no vomiting or diarrhea. No bloody stool but has melena. GENITOURINARY: No dysuria, hematuria.  ENDOCRINE: No polyuria, nocturia,  HEMATOLOGY: No anemia, easy bruising or bleeding SKIN: No rash or  lesion. MUSCULOSKELETAL: No joint pain or arthritis.   NEUROLOGIC: No tingling, numbness, weakness.  PSYCHIATRY: No anxiety or depression.   MEDICATIONS AT HOME:   Prior to Admission medications   Medication Sig Start Date End Date Taking? Authorizing Provider  acetaminophen (TYLENOL) 325 MG tablet Take 650 mg by mouth every 4 (four) hours as needed for mild pain or fever.   Yes Historical Provider, MD  ALPRAZolam (XANAX) 0.25 MG tablet Take 1 tablet (0.25 mg total) by mouth at bedtime as needed for anxiety. 06/12/15  Yes Enid Baas, MD  Alum & Mag Hydroxide-Simeth (GI COCKTAIL) SUSP suspension Take 30 mLs by mouth every 6 (six) hours as needed (for severe reflux). Also has Lidocaine in it.  Shake well.   Yes Historical Provider, MD  alum hydroxide-mag trisilicate (GAVISCON) 80-20 MG CHEW chewable tablet Chew 2 tablets by mouth 4 (four) times daily as needed for indigestion or heartburn.   Yes Historical Provider, MD  azelastine (ASTELIN) 0.1 % nasal spray Place 1 spray into both nostrils 2 (two) times daily.   Yes Historical Provider, MD  cyanocobalamin (,VITAMIN B-12,) 1000 MCG/ML injection Inject 1 mL (1,000 mcg total) into the muscle every 30 (thirty) days. Patient taking differently: Inject 1,000 mcg into the muscle every 30 (thirty) days. Pt uses on the 1st of every month. 11/08/11  Yes Sherlene Shams, MD  diclofenac (FLECTOR) 1.3 % PTCH Place 1 patch onto the skin 2 (two) times daily. 07/03/15  Yes Sherlene Shams, MD  Fluticasone-Salmeterol (ADVAIR) 250-50 MCG/DOSE AEPB Inhale 1 puff into the lungs 2 (two) times daily.   Yes Historical Provider, MD  hydroxychloroquine (PLAQUENIL) 200 MG tablet Take 200 mg by mouth daily.   Yes Historical Provider, MD  ipratropium (ATROVENT) 0.03 % nasal spray Place 2 sprays into both nostrils 3 (three) times daily.   Yes Historical Provider, MD  letrozole (FEMARA) 2.5 MG tablet Take 2.5 mg by mouth daily.   Yes Historical Provider, MD  levocetirizine  (XYZAL) 5 MG tablet Take 5 mg by mouth daily.   Yes Historical Provider, MD  loperamide (IMODIUM) 2 MG capsule Take 2-4 mg by mouth as needed for diarrhea or loose stools.   Yes Historical Provider, MD  mycophenolate (CELLCEPT) 250 MG capsule Take 1,000 mg by mouth 2 (two) times daily.   Yes Historical Provider, MD  Nutritional Supplements (FEEDING SUPPLEMENT, JEVITY 1.5 CAL/FIBER,) LIQD Place 1,000 mLs into feeding tube continuous. 06/12/15  Yes Enid Baas, MD  nystatin (MYCOSTATIN) 100000 UNIT/ML suspension Take 5 mLs (500,000 Units total) by mouth 4 (four) times daily. 06/12/15  Yes Enid Baas, MD  ondansetron (ZOFRAN-ODT) 4 MG disintegrating tablet Take 1 tablet (4 mg total) by mouth every 4 (four) hours as needed for nausea or vomiting. 07/10/15  Yes Sherlene Shams, MD  oxyCODONE-acetaminophen (PERCOCET/ROXICET) 5-325 MG per tablet Take 1 tablet by mouth every 6 (six) hours as needed for severe pain. Pt is able to be given PRN doses in addition to scheduled doses. 06/12/15  Yes Enid Baas, MD  pantoprazole sodium (PROTONIX) 40 mg/20 mL PACK  Place 40 mg into feeding tube 2 (two) times daily.   Yes Historical Provider, MD  predniSONE (DELTASONE) 5 MG tablet Take 5 mg by mouth daily. Pt takes in addition to the 1mg  tablet.   Yes Historical Provider, MD  promethazine (PHENERGAN) 25 MG tablet Take 6.25-12.5 mg by mouth every 4 (four) hours as needed for nausea or vomiting.    Yes Historical Provider, MD  SYNTHROID 150 MCG tablet Take 1 tablet (150 mcg total) by mouth daily. 01/12/15  Yes Sherlene Shams, MD  Tadalafil, PAH, (ADCIRCA) 20 MG TABS Take 40 mg by mouth daily.   Yes Historical Provider, MD  Water For Irrigation, Sterile (FREE WATER) SOLN Place 150 mLs into feeding tube 2 (two) times daily. 06/12/15  Yes Enid Baas, MD  Water For Irrigation, Sterile (FREE WATER) SOLN Place 25 mLs into feeding tube continuous. 06/13/15  Yes Enid Baas, MD  Water For Irrigation,  Sterile (FREE WATER) SOLN Place 150 mLs into feeding tube 2 (two) times daily. 06/13/15  Yes Enid Baas, MD      VITAL SIGNS:  Blood pressure 98/49, pulse 100, temperature 98.3 F (36.8 C), temperature source Oral, resp. rate 20, height 5\' 3"  (1.6 m), weight 58.968 kg (130 lb), SpO2 98 %.  PHYSICAL EXAMINATION:  GENERAL:  80 y.o.-year-old patient lying in the bed with no acute distress.  EYES: Pupils equal, round, reactive to light and accommodation. No scleral icterus. Extraocular muscles intact.  HEENT: Head atraumatic, normocephalic. Oropharynx and nasopharynx clear. Moist oral mucosa. NECK:  Supple, no jugular venous distention. No thyroid enlargement, no tenderness.  LUNGS: Normal breath sounds bilaterally, no wheezing, rales,rhonchi or crepitation. No use of accessory muscles of respiration.  CARDIOVASCULAR: S1, S2 normal. No murmurs, rubs, or gallops.  ABDOMEN: Soft, nontender, nondistended. Bowel sounds present. No organomegaly or mass.  EXTREMITIES: Bilateral lower extremity edema 1+, no cyanosis, or clubbing.  NEUROLOGIC: Cranial nerves II through XII are intact. Muscle strength 3-4/5 in all extremities. Sensation intact. Gait not checked.  PSYCHIATRIC: The patient is alert and oriented x 3.  SKIN: No obvious rash, lesion, or ulcer.   LABORATORY PANEL:   CBC  Recent Labs Lab 07/17/15 1442  WBC 8.9  HGB 8.0*  HCT 24.1*  PLT 379   ------------------------------------------------------------------------------------------------------------------  Chemistries   Recent Labs Lab 07/17/15 1559  NA 139  K 3.7  CL 100*  CO2 29  GLUCOSE 110*  BUN 45*  CREATININE 1.49*  CALCIUM 8.1*  AST 25  ALT 10*  ALKPHOS 71  BILITOT 0.6   ------------------------------------------------------------------------------------------------------------------  Cardiac Enzymes No results for input(s): TROPONINI in the last 168  hours. ------------------------------------------------------------------------------------------------------------------  RADIOLOGY:  Dg Abd 2 Views  07/17/2015  CLINICAL DATA:  Nausea, vomiting and constipation 2 weeks. Surgery for volvulus 2-4 months ago. Check feeding tube. EXAM: ABDOMEN - 2 VIEW COMPARISON:  03/30/2015 and CT 06/08/2015 FINDINGS: Lung bases demonstrate stable small left effusion. Percutaneous gastrostomy tube is present with tip overlying the left mid abdomen. Interval placement of IVC filter with tip at the L1 level. Bowel gas pattern is nonobstructive with mild fecal retention most notable over the rectosigmoid colon. No dilated loops of bowel and no free peritoneal air. Moderate degenerative changes of the spine and moderate curvature of the spine unchanged. Degenerative change of the hips. IMPRESSION: Nonobstructive bowel gas pattern with mild fecal retention most prominent over the rectosigmoid colon. Percutaneous gastrostomy tube with tip over the left mid abdomen. Small left pleural effusion unchanged. Electronically Signed  By: Elberta Fortis M.D.   On: 07/17/2015 14:26    EKG:   Orders placed or performed during the hospital encounter of 07/17/15  . ED EKG  . ED EKG    IMPRESSION AND PLAN:   Upper GI bleeding Anemia due to acute blood loss Acute renal failure History of erosive esophagitis  History of CHF  The patient will be admitted to medical floor. Keep nothing by mouth except medications and simple water. PRBC transfusion, Continue Protonix IV twice a day and Carafate, follow-up hemoglobin every 6 hours. Follow-up with Dr. Marva Panda for possible endoscopy. Start IV fluid support and follow-up BMP. Hold Lasix due to renal failure.  All the records are reviewed and case discussed with ED provider. Management plans discussed with the patient, her son and they are in agreement.  CODE STATUS: Full code  TOTAL TIME TAKING CARE OF THIS PATIENT: 57   minutes.    Shaune Pollack M.D on 07/17/2015 at 6:17 PM  Between 7am to 6pm - Pager - [phone]  After 6pm go to [email or web address] - password [email or web address] [facility]  [facility] [place]  Office  [phone]  CC: Primary care physician; Sherlene Shams, MD

## 2015-07-17 NOTE — ED Provider Notes (Signed)
[facility] Emergency Department Provider Note     Time seen: ----------------------------------------- 3:27 PM on 07/17/2015 -----------------------------------------    I have reviewed the triage vital signs and the nursing notes.   HISTORY  Chief Complaint Weakness    HPI Diana [name] is a 80 y.o. female who presents from [place] clinic for weakness and reported low hemoglobin. Patient reports black stools and decreased appetite and nausea. She has a feeding tube, this is a chronic problem for her, has not been able to identify the bleeding source. She does have abdominal pain, nothing makes it better or worse.   Past Medical History  Diagnosis Date  . Lupus (systemic lupus erythematosus) ([place])   . Pulmonary hypertension ([place])   . Pneumonia   . Unspecified menopausal and postmenopausal disorder   . Primary pulmonary hypertension ([place])   . Acute glomerulonephritis with other specified pathological lesion in kidney in disease classified elsewhere(580.81)   . Unspecified hypothyroidism   . Shingles (herpes zoster) polyneuropathy March 2013    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  . Hiatal hernia   . DVT (deep venous thrombosis) ([place])   . Cancer (HCC)     Breast  . CHF (congestive heart failure) ([place])   . Renal insufficiency     Patient Active Problem List   Diagnosis Date Noted  . Hypernatremia 07/03/2015  . Decubitus ulcer of sacral region, stage 1 07/03/2015  . Non-traumatic compression fracture of L2 lumbar vertebra 07/03/2015  . Generalized weakness 07/03/2015  . Anemia, iron deficiency 07/03/2015  . GI bleed 06/09/2015  . Rectal bleeding 06/08/2015  . Scleroderma of esophagus ([place]) 05/26/2015  . Dehydration 04/13/2015  . Truncal muscle weakness 04/08/2015  . History of fall within past 90 days 04/07/2015  . Protein-calorie malnutrition ([place]) 04/06/2015  . Esophagitis 03/28/2015  . Pneumonia 03/26/2015  . Gastric volvulus  03/24/2015  . Postprocedural fever 03/24/2015  . Nausea with vomiting 03/24/2015  . Pulmonary hypertension ([place]) 03/12/2015  . Volvulus of stomach 03/11/2015  . Flank pain 12/21/2014  . Dyspnea 11/13/2014  . Herpes zoster 11/10/2014  . Chronic anticoagulation 11/10/2014  . Venous insufficiency of both lower extremities 06/13/2014  . Breast cancer of upper-outer quadrant of left female breast ([place]) 05/26/2014  . Urinary incontinence 01/15/2014  . Other and unspecified hyperlipidemia 08/21/2013  . H/O recurrent pneumonia 05/26/2013  . Hypertensive pulmonary venous disease ([place]) 04/23/2013  . Diastolic dysfunction with heart failure ([place]) 04/23/2013  . DVT, recurrent, lower extremity, chronic ([place]) 01/07/2013  . Hiatal hernia 01/06/2013  . Renal mass, left 01/06/2013  . Sciatica of right side 11/20/2012  . Post herpetic neuralgia 11/20/2012  . Disseminated zoster 10/31/2012  . Insomnia 11/08/2011  . LUPUS ERYTHEMATOSUS 06/13/2008  . NEUROPATHY-PERIPHERAL 10/06/2006  . Hypothyroidism 09/25/2006  . ANEMIA, B12 DEFICIENCY 09/25/2006  . GERD 09/25/2006  . Systemic lupus erythematosus ([place]) 09/25/2006  . Diabetes mellitus without complication ([place]) 123XX123  . SHINGLES, HX OF 09/25/2006  . TOBACCO ABUSE, HX OF 09/25/2006    Past Surgical History  Procedure Laterality Date  . Kyphoplasty N/A 02/10/2015    Procedure: [id number];  Surgeon: Hessie Knows, MD;  Location: [facility] ORS;  Service: Orthopedics;  Laterality: N/A;  . Laparotomy N/A 03/11/2015    Procedure: REDUCTION OF GASTRIC VOLVULUS, SPLEENECTOMY, GASTRIC TUBE PLACEMENT;  Surgeon: Robert Bellow, MD;  Location: [facility] ORS;  Service: General;  Laterality: N/A;  . Esophagogastroduodenoscopy (egd) with propofol N/A 03/28/2015    Procedure: ESOPHAGOGASTRODUODENOSCOPY (EGD) WITH PROPOFOL;  Surgeon: Dellis Filbert  Amedeo Kinsman, MD;  Location: [facility] ENDOSCOPY;  Service: Endoscopy;  Laterality: N/A;  . Esophagogastroduodenoscopy (egd) with  propofol N/A 06/10/2015    Procedure: ESOPHAGOGASTRODUODENOSCOPY (EGD) WITH PROPOFOL;  Surgeon: Lollie Sails, MD;  Location: [facility] ENDOSCOPY;  Service: Endoscopy;  Laterality: N/A;  . Peripheral vascular catheterization N/A 06/12/2015    Procedure: IVC Filter Insertion;  Surgeon: Algernon Huxley, MD;  Location: [place] CV LAB;  Service: Cardiovascular;  Laterality: N/A;    Allergies Amoxicillin; Cefuroxime; Contrast media; Cephalexin; Sulfonamide derivatives; Tramadol hcl; and Venlafaxine  Social History Social History  Substance Use Topics  . Smoking status: Former Smoker    Quit date: 05/25/1991  . Smokeless tobacco: Never Used  . Alcohol Use: Yes     Comment: rare    Review of Systems Constitutional: Negative for fever. Eyes: Negative for visual changes. ENT: Negative for sore throat. Cardiovascular: Negative for chest pain. Respiratory: Negative for shortness of breath. Gastrointestinal: Positive for abdominal pain, positive for black stools Genitourinary: Negative for dysuria. Musculoskeletal: Negative for back pain. Skin: Negative for rash. Neurological: Negative for headaches, positive for weakness 10-point ROS otherwise negative.  ____________________________________________   PHYSICAL EXAM:  VITAL SIGNS: ED Triage Vitals  Enc Vitals Group     BP 07/17/15 1432 98/49 mmHg     Pulse Rate 07/17/15 1432 100     Resp 07/17/15 1432 20     Temp 07/17/15 1432 98.3 F (36.8 C)     Temp Source 07/17/15 1432 Oral     SpO2 07/17/15 1432 98 %     Weight 07/17/15 1432 130 lb (58.968 kg)     Height 07/17/15 1432 5\' 3"  (1.6 m)     Head Cir --      [place] --      Pain Score 07/17/15 1433 6     Pain Loc --      Pain Edu? --      Excl. in [place]? --     Constitutional: Alert and oriented. Well appearing and in no distress. Eyes: Conjunctivae are normal. PERRL. Normal extraocular movements. ENT   Head: Normocephalic and atraumatic.   Nose: No  congestion/rhinnorhea.   Mouth/Throat: Mucous membranes are moist.   Neck: No stridor. Cardiovascular: Normal rate, regular rhythm. Normal and symmetric distal pulses are present in all extremities. No murmurs, rubs, or gallops. Respiratory: Normal respiratory effort without tachypnea nor retractions. Breath sounds are clear and equal bilaterally. No wheezes/rales/rhonchi. Gastrointestinal: Abdominal tenderness, no rebound or guarding. Normal bowel sounds. Musculoskeletal: Nontender with normal range of motion in all extremities. No joint effusions.  No lower extremity tenderness nor edema. Neurologic:  Normal speech and language. No gross focal neurologic deficits are appreciated. Speech is normal. No gait instability. Skin:  Skin is warm, dry and intact, pallor is noted Psychiatric: Mood and affect are normal. Speech and behavior are normal. Patient exhibits appropriate insight and judgment. ____________________________________________  EKG: Interpreted by me. Normal sinus rhythm with a rate of 100, normal PR interval, normal uterus with, long QT interval.  ____________________________________________  ED COURSE:  Pertinent labs & imaging results that were available during my care of the patient were reviewed by me and considered in my medical decision making (see chart for details). Patient is in no acute distress but does appear pale. ____________________________________________    LABS (pertinent positives/negatives)  Labs Reviewed  CBC - Abnormal; Notable for the following:    RBC 2.73 (*)    Hemoglobin 8.0 (*)    HCT  24.1 (*)    RDW 20.4 (*)    All other components within normal limits  URINALYSIS COMPLETEWITH MICROSCOPIC ([facility] ONLY) - Abnormal; Notable for the following:    Color, Urine YELLOW (*)    APPearance CLEAR (*)    Leukocytes, UA TRACE (*)    Squamous Epithelial / LPF 0-5 (*)    All other components within normal limits  COMPREHENSIVE METABOLIC PANEL -  Abnormal; Notable for the following:    Chloride 100 (*)    Glucose, Bld 110 (*)    BUN 45 (*)    Creatinine, Ser 1.49 (*)    Calcium 8.1 (*)    Total Protein 5.6 (*)    Albumin 2.4 (*)    ALT 10 (*)    GFR calc non Af Amer 32 (*)    GFR calc Af Amer 38 (*)    All other components within normal limits  BASIC METABOLIC PANEL  HEMOGLOBIN  CBC  CBG MONITORING, ED  PREPARE RBC (CROSSMATCH)  TYPE AND SCREEN   ____________________________________________  FINAL ASSESSMENT AND PLAN  Anemia, abdominal pain, GI bleed  Plan: Patient with labs and imaging as dictated above. I have discussed the case with Dr. Jaquita Rector recommends blood transfusion and admission. She will need serial H&H as well as continued IV Protonix. I have restarted Carafate which she had stopped taking.   Earleen Newport, MD   Earleen Newport, MD 07/17/15 [phone]

## 2015-07-17 NOTE — ED Notes (Signed)
Pt to ed with c/o low hemoglobin.  Pt sent from [facility] clinic with c/o weakness overall.   Pt reports black stools and decreased appetite and nausea.

## 2015-07-18 DIAGNOSIS — E43 Unspecified severe protein-calorie malnutrition: Secondary | ICD-10-CM | POA: Insufficient documentation

## 2015-07-18 LAB — TYPE AND SCREEN
ABO/RH(D): O POS
ANTIBODY SCREEN: NEGATIVE
UNIT DIVISION: 0

## 2015-07-18 LAB — HEMOGLOBIN
HEMOGLOBIN: 8.2 g/dL — AB (ref 12.0–16.0)
Hemoglobin: 8.7 g/dL — ABNORMAL LOW (ref 12.0–16.0)

## 2015-07-18 LAB — CBC
HEMATOCRIT: 25.8 % — AB (ref 35.0–47.0)
HEMOGLOBIN: 8.3 g/dL — AB (ref 12.0–16.0)
MCH: 28.2 pg (ref 26.0–34.0)
MCHC: 32.3 g/dL (ref 32.0–36.0)
MCV: 87.4 fL (ref 80.0–100.0)
Platelets: 368 10*3/uL (ref 150–440)
RBC: 2.95 MIL/uL — ABNORMAL LOW (ref 3.80–5.20)
RDW: 19.4 % — AB (ref 11.5–14.5)
WBC: 6.9 10*3/uL (ref 3.6–11.0)

## 2015-07-18 LAB — BASIC METABOLIC PANEL
Anion gap: 7 (ref 5–15)
BUN: 38 mg/dL — AB (ref 6–20)
CHLORIDE: 104 mmol/L (ref 101–111)
CO2: 30 mmol/L (ref 22–32)
CREATININE: 1.32 mg/dL — AB (ref 0.44–1.00)
Calcium: 7.9 mg/dL — ABNORMAL LOW (ref 8.9–10.3)
GFR calc Af Amer: 44 mL/min — ABNORMAL LOW (ref >60)
GFR calc non Af Amer: 38 mL/min — ABNORMAL LOW (ref >60)
GLUCOSE: 98 mg/dL (ref 65–99)
POTASSIUM: 3.7 mmol/L (ref 3.5–5.1)
Sodium: 141 mmol/L (ref 135–145)

## 2015-07-18 LAB — HEMOGLOBIN AND HEMATOCRIT, BLOOD
HCT: 22.7 % — ABNORMAL LOW (ref 35.0–47.0)
HEMOGLOBIN: 7.1 g/dL — AB (ref 12.0–16.0)

## 2015-07-18 MED ORDER — TADALAFIL (PAH) 20 MG PO TABS
40.0000 mg | ORAL_TABLET | Freq: Every day | ORAL | Status: DC
  Administered 2015-07-19 – 2015-07-20 (×2): 40 mg via ORAL
  Filled 2015-07-18 (×3): qty 2

## 2015-07-18 MED ORDER — NYSTATIN 100000 UNIT/ML MT SUSP
5.0000 mL | Freq: Four times a day (QID) | OROMUCOSAL | Status: DC
  Administered 2015-07-18 – 2015-07-20 (×6): 500000 [IU] via ORAL
  Filled 2015-07-18 (×11): qty 5

## 2015-07-18 MED ORDER — HYDROXYCHLOROQUINE SULFATE 200 MG PO TABS
200.0000 mg | ORAL_TABLET | Freq: Every day | ORAL | Status: DC
  Administered 2015-07-19 – 2015-07-20 (×2): 200 mg via ORAL
  Filled 2015-07-18 (×2): qty 1

## 2015-07-18 MED ORDER — GI COCKTAIL ~~LOC~~
30.0000 mL | Freq: Four times a day (QID) | ORAL | Status: DC | PRN
  Filled 2015-07-18: qty 30

## 2015-07-18 MED ORDER — LETROZOLE 2.5 MG PO TABS
2.5000 mg | ORAL_TABLET | Freq: Every day | ORAL | Status: DC
  Administered 2015-07-19 – 2015-07-20 (×2): 2.5 mg via ORAL
  Filled 2015-07-18 (×3): qty 1

## 2015-07-18 MED ORDER — SODIUM CHLORIDE 0.9 % IV SOLN
INTRAVENOUS | Status: DC
Start: 2015-07-18 — End: 2015-07-19

## 2015-07-18 MED ORDER — MYCOPHENOLATE MOFETIL 250 MG PO CAPS
1000.0000 mg | ORAL_CAPSULE | Freq: Two times a day (BID) | ORAL | Status: DC
  Administered 2015-07-19 – 2015-07-20 (×2): 1000 mg via ORAL
  Filled 2015-07-18 (×5): qty 4

## 2015-07-18 MED ORDER — PREDNISONE 5 MG PO TABS
6.0000 mg | ORAL_TABLET | Freq: Every day | ORAL | Status: DC
  Filled 2015-07-18 (×3): qty 1

## 2015-07-18 NOTE — Progress Notes (Addendum)
Initial Nutrition Assessment  DOCUMENTATION CODES:   Severe malnutrition in context of chronic illness  INTERVENTION:   Coordination of Care: request new wt as unsure if current wt is estimated, stated or measured EN: TF on hold at present with pending GI consult, pt continues with GI bleed. May need to consider changing TF formula to low residue formula when TF restarted. Will continue to assess   NUTRITION DIAGNOSIS:   Malnutrition related to chronic illness as evidenced by energy intake < or equal to 75% for > or equal to 1 month, percent weight loss.  GOAL:   Patient will meet greater than or equal to 90% of their needs  MONITOR:    (Energy Intake, EN, Digestive System, Anthropometrics, Electrolyte/Renal Profile)  REASON FOR ASSESSMENT:   Malnutrition Screening Tool    ASSESSMENT:    Pt admitted with weakness, melena with GI bleed; GI consult pending. Pt c/o nausea this AM.   Past Medical History  Diagnosis Date  . Lupus (systemic lupus erythematosus) ([place])   . Pulmonary hypertension ([place])   . Pneumonia   . Unspecified menopausal and postmenopausal disorder   . Primary pulmonary hypertension ([place])   . Acute glomerulonephritis with other specified pathological lesion in kidney in disease classified elsewhere(580.81)   . Unspecified hypothyroidism   . Shingles (herpes zoster) polyneuropathy March 2013    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  . Hiatal hernia   . DVT (deep venous thrombosis) ([place])   . Cancer (HCC)     Breast  . CHF (congestive heart failure) ([place])   . Renal insufficiency    Past Surgical History  Procedure Laterality Date  . Kyphoplasty N/A 02/10/2015    Procedure: [id number];  Surgeon: Hessie Knows, MD;  Location: [facility] ORS;  Service: Orthopedics;  Laterality: N/A;  . Laparotomy N/A 03/11/2015    Procedure: REDUCTION OF GASTRIC VOLVULUS, SPLEENECTOMY, GASTRIC TUBE PLACEMENT;  Surgeon: Robert Bellow, MD;  Location: [facility] ORS;  Service:  General;  Laterality: N/A;  . Esophagogastroduodenoscopy (egd) with propofol N/A 03/28/2015    Procedure: ESOPHAGOGASTRODUODENOSCOPY (EGD) WITH PROPOFOL;  Surgeon: Robert Bellow, MD;  Location: [facility] ENDOSCOPY;  Service: Endoscopy;  Laterality: N/A;  . Esophagogastroduodenoscopy (egd) with propofol N/A 06/10/2015    Procedure: ESOPHAGOGASTRODUODENOSCOPY (EGD) WITH PROPOFOL;  Surgeon: Lollie Sails, MD;  Location: [facility] ENDOSCOPY;  Service: Endoscopy;  Laterality: N/A;  . Peripheral vascular catheterization N/A 06/12/2015    Procedure: IVC Filter Insertion;  Surgeon: Algernon Huxley, MD;  Location: [place] CV LAB;  Service: Cardiovascular;  Laterality: N/A;     Diet Order:  Diet NPO time specified   Food and Nutrition Related History: pt has been home with her son for the past several weeks after leaving SNF. Pt reports she has been doing nighttime TF at home of [place] 1.5 at rate of 45 ml/hr from 9pm until 1pm the following day (1080 kcals, 46 g of protein). Pt reports she has also been on a Liquid Diet but when asked about what liquids she has been consuming, pt reports she really has not been eating anything by mouth. TF meets <75% of low end of estimated energy needs range.   Digestive System: pt complains of nausea at present, no vomitting currently. Reports she has been having some diarrhea at home. G-tube in place, pt reports no issues with tube at home, site is just tender to the touch. Pt with esophagitis with EGD on last admission  Nutrition Focused Physical Exam: Nutrition-Focused physical exam completed. Findings  are normal fat depletion, mild/moderate muscle depletion, and mild edema.    Skin:  Reviewed, no issues  Electrolyte and Renal Profile:  Recent Labs Lab 07/17/15 1559 07/18/15 0056  BUN 45* 38*  CREATININE 1.49* 1.32*  NA 139 141  K 3.7 3.7   Glucose Profile: No results for input(s): GLUCAP in the last 72 hours.   Meds: NS at 75 ml/hr  Height:   Ht  Readings from Last 1 Encounters:  07/17/15 5\' 3"  (1.6 m)    Weight: pt believes she has lost weight but is not sure how much and is unsure of recent weight. Based on weight trend below, pt has no weight loss recently. But significant wt loss since May (15.5% wt loss in 5 months) and unable to gain weight back despite nutrition support. Noted pt with some mild edema on this admission. Unsure if current wt is stated or measured  Wt Readings from Last 1 Encounters:  07/17/15 130 lb (58.968 kg)    Filed Weights   07/17/15 1432  Weight: 130 lb (58.968 kg)   Wt Readings from Last 10 Encounters:  07/17/15 130 lb (58.968 kg)  07/03/15 126 lb 4 oz (57.267 kg)  06/10/15 124 lb (56.246 kg)  04/13/15 129 lb (58.514 kg)  04/13/15 128 lb 6.4 oz (58.242 kg)  04/06/15 134 lb (60.782 kg)  04/03/15 158 lb 12.8 oz (72.031 kg)  03/24/15 138 lb (62.596 kg)  03/20/15 141 lb 1.6 oz (64.003 kg)  02/10/15 154 lb (69.854 kg)    BMI:  Body mass index is 23.03 kg/(m^2).  Estimated Nutritional Needs:   Kcal:  1485-1756 kacls (BEE 1039, 1.3 AF, 1.1-1.3 IF) using wt of 59 kg  Protein:  65-83 g (1.1-1.4 g/kg)   Fluid:  1475-1770 mL (25-30 ml/kg)     HIGH Care Level  Kerman Passey MS, RD, LDN [phone] Pager

## 2015-07-18 NOTE — Progress Notes (Signed)
Patient ID: Diana [name], female   DOB: 1935/06/05, 80 y.o.   MRN: [id number] GI Inpatient Consult Note  Reason for Consult: GI bleeding.   Attending Requesting Consult: Dr. Imogene Burn  History of Present Illness: Diana [name] is a 80 y.o. female with multiple medical problems, incl lupus, pulm HTN, and feeding via G tube due to malnutrition, who was sent from Dr. Reyes Ivan office yesterday for melena and drop in hgb. Covering for Dr. Marva Panda this weekend. Had severe reflux esophagitis seen by Dr. Lemar Livings in 7/16. This was again confirmed by repeat EGD by Dr. Marva Panda in 9/16. Was scheduled to have repeat EGD to assess for healing in 11/16. Unfortunately, pt started having melena, drop in hgb, and nausea/vomiting. Get TF continuously from 9 PM to 1 PM daily. Takes some food by mouth daily. Was getting protonix bid via G tube since 9/16, according to pt. Denies having colonoscopy in the past. Sees black stools only. Recent ENT evaluation for lesion in throat was negative.   Past Medical History:  Past Medical History  Diagnosis Date  . Lupus (systemic lupus erythematosus) (HCC)   . Pulmonary hypertension (HCC)   . Pneumonia   . Unspecified menopausal and postmenopausal disorder   . Primary pulmonary hypertension (HCC)   . Acute glomerulonephritis with other specified pathological lesion in kidney in disease classified elsewhere(580.81)   . Unspecified hypothyroidism   . Shingles (herpes zoster) polyneuropathy March 2013    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  . Hiatal hernia   . DVT (deep venous thrombosis) (HCC)   . Cancer (HCC)     Breast  . CHF (congestive heart failure) (HCC)   . Renal insufficiency     Problem List: Patient Active Problem List   Diagnosis Date Noted  . GIB (gastrointestinal bleeding) 07/17/2015  . Anemia 07/17/2015  . ARF (acute renal failure) (HCC) 07/17/2015  . Hypernatremia 07/03/2015  . Decubitus ulcer of sacral region, stage 1 07/03/2015  . Non-traumatic  compression fracture of L2 lumbar vertebra 07/03/2015  . Generalized weakness 07/03/2015  . Anemia, iron deficiency 07/03/2015  . GI bleed 06/09/2015  . Rectal bleeding 06/08/2015  . Scleroderma of esophagus (HCC) 05/26/2015  . Dehydration 04/13/2015  . Truncal muscle weakness 04/08/2015  . History of fall within past 90 days 04/07/2015  . Protein-calorie malnutrition (HCC) 04/06/2015  . Esophagitis 03/28/2015  . Pneumonia 03/26/2015  . Gastric volvulus 03/24/2015  . Postprocedural fever 03/24/2015  . Nausea with vomiting 03/24/2015  . Pulmonary hypertension (HCC) 03/12/2015  . Volvulus of stomach 03/11/2015  . Flank pain 12/21/2014  . Dyspnea 11/13/2014  . Herpes zoster 11/10/2014  . Chronic anticoagulation 11/10/2014  . Venous insufficiency of both lower extremities 06/13/2014  . Breast cancer of upper-outer quadrant of left female breast (HCC) 05/26/2014  . Urinary incontinence 01/15/2014  . Other and unspecified hyperlipidemia 08/21/2013  . H/O recurrent pneumonia 05/26/2013  . Hypertensive pulmonary venous disease (HCC) 04/23/2013  . Diastolic dysfunction with heart failure (HCC) 04/23/2013  . DVT, recurrent, lower extremity, chronic (HCC) 01/07/2013  . Hiatal hernia 01/06/2013  . Renal mass, left 01/06/2013  . Sciatica of right side 11/20/2012  . Post herpetic neuralgia 11/20/2012  . Disseminated zoster 10/31/2012  . Insomnia 11/08/2011  . LUPUS ERYTHEMATOSUS 06/13/2008  . NEUROPATHY-PERIPHERAL 10/06/2006  . Hypothyroidism 09/25/2006  . ANEMIA, B12 DEFICIENCY 09/25/2006  . GERD 09/25/2006  . Systemic lupus erythematosus (HCC) 09/25/2006  . Diabetes mellitus without complication (HCC) 09/25/2006  . SHINGLES, HX OF 09/25/2006  .  TOBACCO ABUSE, HX OF 09/25/2006    Past Surgical History: Past Surgical History  Procedure Laterality Date  . Kyphoplasty N/A 02/10/2015    Procedure: [id number];  Surgeon: Kennedy Bucker, MD;  Location: [facility] ORS;  Service: Orthopedics;   Laterality: N/A;  . Laparotomy N/A 03/11/2015    Procedure: REDUCTION OF GASTRIC VOLVULUS, SPLEENECTOMY, GASTRIC TUBE PLACEMENT;  Surgeon: Earline Mayotte, MD;  Location: [facility] ORS;  Service: General;  Laterality: N/A;  . Esophagogastroduodenoscopy (egd) with propofol N/A 03/28/2015    Procedure: ESOPHAGOGASTRODUODENOSCOPY (EGD) WITH PROPOFOL;  Surgeon: Earline Mayotte, MD;  Location: [facility] ENDOSCOPY;  Service: Endoscopy;  Laterality: N/A;  . Esophagogastroduodenoscopy (egd) with propofol N/A 06/10/2015    Procedure: ESOPHAGOGASTRODUODENOSCOPY (EGD) WITH PROPOFOL;  Surgeon: Christena Deem, MD;  Location: [facility] ENDOSCOPY;  Service: Endoscopy;  Laterality: N/A;  . Peripheral vascular catheterization N/A 06/12/2015    Procedure: IVC Filter Insertion;  Surgeon: Annice Needy, MD;  Location: [facility] INVASIVE CV LAB;  Service: Cardiovascular;  Laterality: N/A;    Allergies: Allergies  Allergen Reactions  . Amoxicillin Other (See Comments)    Reaction:  Stomach cramps   . Cefuroxime Itching  . Contrast Media [Iodinated Diagnostic Agents] Hives  . Morphine And Related     Reaction: face flushed    . Cephalexin Rash  . Sulfonamide Derivatives Rash  . Tramadol Hcl Rash  . Venlafaxine Rash    Home Medications: Prescriptions prior to admission  Medication Sig Dispense Refill Last Dose  . acetaminophen (TYLENOL) 325 MG tablet Take 650 mg by mouth every 4 (four) hours as needed for mild pain or fever.   PRN at PRN  . ALPRAZolam (XANAX) 0.25 MG tablet Take 1 tablet (0.25 mg total) by mouth at bedtime as needed for anxiety. 20 tablet 0 PRN at PRN  . Alum & Mag Hydroxide-Simeth (GI COCKTAIL) SUSP suspension Take 30 mLs by mouth every 6 (six) hours as needed (for severe reflux). Also has Lidocaine in it.  Shake well.   Past Month at PRN  . alum hydroxide-mag trisilicate (GAVISCON) 80-20 MG CHEW chewable tablet Chew 2 tablets by mouth 4 (four) times daily as needed for indigestion or heartburn.   Past Month at  Unknown time  . azelastine (ASTELIN) 0.1 % nasal spray Place 1 spray into both nostrils 2 (two) times daily.   07/17/2015 at Unknown time  . cyanocobalamin (,VITAMIN B-12,) 1000 MCG/ML injection Inject 1 mL (1,000 mcg total) into the muscle every 30 (thirty) days. (Patient taking differently: Inject 1,000 mcg into the muscle every 30 (thirty) days. Pt uses on the 1st of every month.) 10 mL 1 Past Month at Unknown time  . diclofenac (FLECTOR) 1.3 % PTCH Place 1 patch onto the skin 2 (two) times daily. 60 patch 1 07/17/2015 at Unknown time  . Fluticasone-Salmeterol (ADVAIR) 250-50 MCG/DOSE AEPB Inhale 1 puff into the lungs 2 (two) times daily.   07/17/2015 at Unknown time  . hydroxychloroquine (PLAQUENIL) 200 MG tablet Take 200 mg by mouth daily.   07/17/2015 at Unknown time  . ipratropium (ATROVENT) 0.03 % nasal spray Place 2 sprays into both nostrils 3 (three) times daily.   07/17/2015 at Unknown time  . letrozole (FEMARA) 2.5 MG tablet Take 2.5 mg by mouth daily.   07/17/2015 at Unknown time  . levocetirizine (XYZAL) 5 MG tablet Take 5 mg by mouth daily.   Past Week at Unknown time  . loperamide (IMODIUM) 2 MG capsule Take 2-4 mg by mouth as needed for  diarrhea or loose stools.   Past Week at Unknown time  . mycophenolate (CELLCEPT) 250 MG capsule Take 1,000 mg by mouth 2 (two) times daily.   07/17/2015 at Unknown time  . Nutritional Supplements (FEEDING SUPPLEMENT, JEVITY 1.5 CAL/FIBER,) LIQD Place 1,000 mLs into feeding tube continuous. 30000 mL 1 Taking  . nystatin (MYCOSTATIN) 100000 UNIT/ML suspension Take 5 mLs (500,000 Units total) by mouth 4 (four) times daily. 60 mL 0 unknown at unknown  . ondansetron (ZOFRAN-ODT) 4 MG disintegrating tablet Take 1 tablet (4 mg total) by mouth every 4 (four) hours as needed for nausea or vomiting. 60 tablet 3 PRN at PRN  . oxyCODONE-acetaminophen (PERCOCET/ROXICET) 5-325 MG per tablet Take 1 tablet by mouth every 6 (six) hours as needed for severe pain. Pt is  able to be given PRN doses in addition to scheduled doses. 20 tablet 0 PRN at PRN  . pantoprazole sodium (PROTONIX) 40 mg/20 mL PACK Place 40 mg into feeding tube 2 (two) times daily.   07/17/2015 at Unknown time  . predniSONE (DELTASONE) 5 MG tablet Take 5 mg by mouth daily. Pt takes in addition to the 1mg  tablet.   07/17/2015 at Unknown time  . promethazine (PHENERGAN) 25 MG tablet Take 6.25-12.5 mg by mouth every 4 (four) hours as needed for nausea or vomiting.    PRN at PRN  . SYNTHROID 150 MCG tablet Take 1 tablet (150 mcg total) by mouth daily. 30 tablet 11 07/17/2015 at Unknown time  . Tadalafil, PAH, (ADCIRCA) 20 MG TABS Take 40 mg by mouth daily.   07/17/2015 at Unknown time  . Water For Irrigation, Sterile (FREE WATER) SOLN Place 150 mLs into feeding tube 2 (two) times daily. 1500 mL 2 Taking  . Water For Irrigation, Sterile (FREE WATER) SOLN Place 25 mLs into feeding tube continuous. 1000 mL 2 Taking  . Water For Irrigation, Sterile (FREE WATER) SOLN Place 150 mLs into feeding tube 2 (two) times daily. 1500 mL 2 Taking   Home medication reconciliation was completed with the patient.   Scheduled Inpatient Medications:   . azelastine  1 spray Each Nare BID  . ipratropium  2 spray Each Nare TID  . levothyroxine  150 mcg Oral QAC breakfast  . mometasone-formoterol  2 puff Inhalation BID  . pantoprazole (PROTONIX) IV  40 mg Intravenous Q12H  . sucralfate  1 g Oral TID WC & HS  . sucralfate  1 g Oral Once    Continuous Inpatient Infusions:   . 0.9 % NaCl with KCl 20 mEq / L 75 mL/hr at 07/17/15 2314    PRN Inpatient Medications:  acetaminophen **OR** acetaminophen, albuterol, ALPRAZolam, cyclobenzaprine, fluticasone, ondansetron **OR** ondansetron (ZOFRAN) IV  Family History: family history includes Alcohol abuse in her sister; Breast cancer in her sister; Cancer in her brother; Colon cancer in her mother; Lung cancer in her son.  The patient's family history is negative for  inflammatory bowel disorders, GI malignancy, or solid organ transplantation.  Social History:   reports that she quit smoking about 24 years ago. She has never used smokeless tobacco. She reports that she drinks alcohol. She reports that she does not use illicit drugs. The patient denies ETOH, tobacco, or drug use.   Review of Systems: Constitutional: Weight is stable.  Eyes: No changes in vision. ENT: No oral lesions, sore throat.  GI: see HPI.  Heme/Lymph: No easy bruising.  CV: No chest pain.  GU: No hematuria.  Integumentary: No rashes.  Neuro: No  headaches.  Psych: No depression/anxiety.  Endocrine: No heat/cold intolerance.  Allergic/Immunologic: No urticaria.  Resp: No cough, SOB.  Musculoskeletal: No joint swelling.    Physical Examination: BP 94/49 mmHg  Pulse 94  Temp(Src) 98.2 F (36.8 C) (Oral)  Resp 16  Ht 5\' 3"  (1.6 m)  Wt 58.968 kg (130 lb)  BMI 23.03 kg/m2  SpO2 95% Gen: NAD, alert and oriented x 4 HEENT: PEERLA, EOMI, Neck: supple, no JVD or thyromegaly Chest: CTA bilaterally, no wheezes, crackles, or other adventitious sounds CV: RRR, no m/g/c/r Abd: soft, NT, ND, +BS in all four quadrants; no HSM, guarding, ridigity, or rebound tenderness Ext: no edema, well perfused with 2+ pulses, Skin: no rash or lesions noted Lymph: no LAD  Data: Lab Results  Component Value Date   WBC 6.9 07/18/2015   HGB 8.2* 07/18/2015   HCT 25.8* 07/18/2015   MCV 87.4 07/18/2015   PLT 368 07/18/2015    Recent Labs Lab 07/17/15 1943 07/18/15 0056 07/18/15 0641  HGB 7.1* 8.3* 8.2*   Lab Results  Component Value Date   NA 141 07/18/2015   K 3.7 07/18/2015   CL 104 07/18/2015   CO2 30 07/18/2015   BUN 38* 07/18/2015   CREATININE 1.32* 07/18/2015   Lab Results  Component Value Date   ALT 10* 07/17/2015   AST 25 07/17/2015   ALKPHOS 71 07/17/2015   BILITOT 0.6 07/17/2015   No results for input(s): APTT, INR, PTT in the last 168  hours. Assessment/Plan: Diana [name] is a 80 y.o. female with likely bleeding from esophagus, based on previous endoscopies.   Recommendations: Give protonix bid IV. Doubt carafate helps much for esophagitis. Continue to moniter hgb. If pt has active bleeding with BRBPR, then consider bleeding scan to localize bleeding source. Plan EGD by Monday with Dr. Marva Panda. If EGD shows no evidence of bleeding, then pt may need coilonoscopyater.  Thank you for the consult. Please call with questions or concerns.  Malayia Spizzirri, Ezzard Standing, MD

## 2015-07-18 NOTE — Progress Notes (Signed)
[facility] [place] at [facility]   PATIENT NAME: Diana [name]    MR#:  [id number]  DATE OF BIRTH:  Jan 13, 1935  SUBJECTIVE:  Patient presented from GI physician's office with melena. She is status post PRBC transfusion. Patient also complaining of nausea.  REVIEW OF SYSTEMS:    Review of Systems  Constitutional: Negative for fever, chills and malaise/fatigue.  HENT: Negative for sore throat.   Eyes: Negative for blurred vision.  Respiratory: Negative for cough, hemoptysis, shortness of breath and wheezing.   Cardiovascular: Negative for chest pain, palpitations and leg swelling.  Gastrointestinal: Positive for nausea and melena. Negative for vomiting, abdominal pain, diarrhea and blood in stool.  Genitourinary: Negative for dysuria.  Musculoskeletal: Negative for back pain.  Neurological: Negative for dizziness, tremors and headaches.  Endo/Heme/Allergies: Does not bruise/bleed easily.    Tolerating Diet: NPO      DRUG ALLERGIES:   Allergies  Allergen Reactions  . Amoxicillin Other (See Comments)    Reaction:  Stomach cramps   . Cefuroxime Itching  . Contrast Media [Iodinated Diagnostic Agents] Hives  . Morphine And Related     Reaction: face flushed    . Cephalexin Rash  . Sulfonamide Derivatives Rash  . Tramadol Hcl Rash  . Venlafaxine Rash    VITALS:  Blood pressure 94/49, pulse 94, temperature 98.2 F (36.8 C), temperature source Oral, resp. rate 16, height 5\' 3"  (1.6 m), weight 58.968 kg (130 lb), SpO2 95 %.  PHYSICAL EXAMINATION:   Physical Exam  Constitutional: She is oriented to person, place, and time and well-developed, well-nourished, and in no distress. No distress.  HENT:  Head: Normocephalic.  Eyes: No scleral icterus.  Neck: Normal range of motion. Neck supple. No JVD present. No tracheal deviation present.  Cardiovascular: Normal rate, regular rhythm and normal heart sounds.  Exam reveals no gallop and no friction  rub.   No murmur heard. Pulmonary/Chest: Effort normal and breath sounds normal. No respiratory distress. She has no wheezes. She has no rales. She exhibits no tenderness.  Abdominal: Soft. Bowel sounds are normal. She exhibits no distension and no mass. There is no tenderness. There is no rebound and no guarding.  G-tube  Musculoskeletal: Normal range of motion. She exhibits no edema.  Neurological: She is alert and oriented to person, place, and time.  Skin: Skin is warm. No rash noted. No erythema.  Psychiatric: Affect and judgment normal.      LABORATORY PANEL:   CBC  Recent Labs Lab 07/18/15 0056 07/18/15 0641  WBC 6.9  --   HGB 8.3* 8.2*  HCT 25.8*  --   PLT 368  --    ------------------------------------------------------------------------------------------------------------------  Chemistries   Recent Labs Lab 07/17/15 1559 07/18/15 0056  NA 139 141  K 3.7 3.7  CL 100* 104  CO2 29 30  GLUCOSE 110* 98  BUN 45* 38*  CREATININE 1.49* 1.32*  CALCIUM 8.1* 7.9*  AST 25  --   ALT 10*  --   ALKPHOS 71  --   BILITOT 0.6  --    ------------------------------------------------------------------------------------------------------------------  Cardiac Enzymes No results for input(s): TROPONINI in the last 168 hours. ------------------------------------------------------------------------------------------------------------------  RADIOLOGY:  Dg Abd 2 Views  07/17/2015  CLINICAL DATA:  Nausea, vomiting and constipation 2 weeks. Surgery for volvulus 2-4 months ago. Check feeding tube. EXAM: ABDOMEN - 2 VIEW COMPARISON:  03/30/2015 and CT 06/08/2015 FINDINGS: Lung bases demonstrate stable small left effusion. Percutaneous gastrostomy tube is present with tip overlying  the left mid abdomen. Interval placement of IVC filter with tip at the L1 level. Bowel gas pattern is nonobstructive with mild fecal retention most notable over the rectosigmoid colon. No dilated loops  of bowel and no free peritoneal air. Moderate degenerative changes of the spine and moderate curvature of the spine unchanged. Degenerative change of the hips. IMPRESSION: Nonobstructive bowel gas pattern with mild fecal retention most prominent over the rectosigmoid colon. Percutaneous gastrostomy tube with tip over the left mid abdomen. Small left pleural effusion unchanged. Electronically Signed   By: Elberta Fortis M.D.   On: 07/17/2015 14:26     ASSESSMENT AND PLAN:   80 year old female with a history of severe reflux esophagitis who presented with drop in hemoglobin and melena.  1. Upper GI bleed: Patient has known history of severe esophagitis. This is likely etiology of her upper GI bleed. Plan is for endoscopy on Monday. Continue to monitor hemoglobin and continue PPI and Carafate.  2. Acute blood loss anemia: Secondary to problem #1. Continue monitor hemoglobin. If patient has active bleeding with bright red blood patient will need a bleeding scan.  3. Protein malnutrition: Patient is G-tube placed and does receive tube feeds at home.  4. Hypothyroid: Continue Synthroid  5. Depression: Continue Xanax when necessary   6. History of SLE: Continue  Outpatient medications.   Management plans discussed with the patient and she is in agreement.  CODE STATUS: FULL  TOTAL TIME TAKING CARE OF THIS PATIENT: 30 minutes.     POSSIBLE D/C 2-3 days, DEPENDING ON CLINICAL CONDITION.   Anieya Helman M.D on 07/18/2015 at 12:03 PM  Between 7am to 6pm - Pager - [phone] After 6pm go to [email or web address] - password [email or web address]  [facility]  Office  [phone]  CC: Primary care physician; Sherlene Shams, MD  Note: This dictation was prepared with Dragon dictation along with smaller phrase technology. Any transcriptional errors that result from this process are unintentional.

## 2015-07-19 ENCOUNTER — Encounter
Admission: EM | Disposition: A | Discharge status: Home / Self Care | Payer: Self-pay | Source: Home / Self Care | Attending: Internal Medicine

## 2015-07-19 ENCOUNTER — Inpatient Hospital Stay: Payer: Medicare Other | Admitting: Anesthesiology

## 2015-07-19 ENCOUNTER — Ambulatory Visit: Admit: 2015-07-19 | Payer: Self-pay | Admitting: Gastroenterology

## 2015-07-19 ENCOUNTER — Encounter: Payer: Self-pay | Admitting: Anesthesiology

## 2015-07-19 HISTORY — PX: ESOPHAGOGASTRODUODENOSCOPY: SHX5428

## 2015-07-19 SURGERY — EGD (ESOPHAGOGASTRODUODENOSCOPY)
Anesthesia: General

## 2015-07-19 SURGERY — EGD (ESOPHAGOGASTRODUODENOSCOPY)
Anesthesia: General | Laterality: Left

## 2015-07-19 MED ORDER — ONDANSETRON HCL 4 MG/2ML IJ SOLN
INTRAMUSCULAR | Status: AC
  Administered 2015-07-19: 4 mg via INTRAVENOUS
  Filled 2015-07-19: qty 2

## 2015-07-19 MED ORDER — PROPOFOL 10 MG/ML IV BOLUS
INTRAVENOUS | Status: DC | PRN
  Administered 2015-07-19: 50 mg via INTRAVENOUS
  Administered 2015-07-19: 100 mg via INTRAVENOUS

## 2015-07-19 MED ORDER — LACTATED RINGERS IV SOLN
INTRAVENOUS | Status: DC | PRN
  Administered 2015-07-19: 09:00:00 via INTRAVENOUS

## 2015-07-19 MED ORDER — PREDNISONE 5 MG PO TABS
5.0000 mg | ORAL_TABLET | Freq: Every day | ORAL | Status: DC
  Administered 2015-07-19 – 2015-07-20 (×2): 5 mg via ORAL
  Filled 2015-07-19: qty 1

## 2015-07-19 NOTE — Anesthesia Preprocedure Evaluation (Addendum)
Anesthesia Evaluation  Patient identified by MRN, date of birth, ID band Patient awake    Reviewed: Allergy & Precautions, NPO status , Patient's Chart, lab work & pertinent test results, reviewed documented beta blocker date and time   Airway Mallampati: II  TM Distance: >3 FB     Dental  (+) Chipped   Pulmonary shortness of breath and with exertion, pneumonia, resolved, former smoker,           Cardiovascular + Peripheral Vascular Disease and +CHF       Neuro/Psych  Neuromuscular disease    GI/Hepatic hiatal hernia, GERD  ,  Endo/Other  diabetes, Type 2Hypothyroidism   Renal/GU Renal InsufficiencyRenal disease     Musculoskeletal   Abdominal   Peds  Hematology  (+) anemia ,   Anesthesia Other Findings Ekg ok. Anemic.  Reproductive/Obstetrics                            Anesthesia Physical Anesthesia Plan  ASA: III  Anesthesia Plan: General   Post-op Pain Management:    Induction: Intravenous  Airway Management Planned: Nasal Cannula  Additional Equipment:   Intra-op Plan:   Post-operative Plan:   Informed Consent: I have reviewed the patients History and Physical, chart, labs and discussed the procedure including the risks, benefits and alternatives for the proposed anesthesia with the patient or authorized representative who has indicated his/her understanding and acceptance.     Plan Discussed with: CRNA  Anesthesia Plan Comments:         Anesthesia Quick Evaluation

## 2015-07-19 NOTE — Anesthesia Postprocedure Evaluation (Signed)
  Anesthesia Post-op Note  Patient: Diana [name]  Procedure(s) Performed: Procedure(s): ESOPHAGOGASTRODUODENOSCOPY (EGD) (N/A)  Anesthesia type:General  Patient location: PACU  Post pain: Pain level controlled  Post assessment: Post-op Vital signs reviewed, Patient's Cardiovascular Status Stable, Respiratory Function Stable, Patent Airway and No signs of Nausea or vomiting  Post vital signs: Reviewed and stable  Last Vitals:  Filed Vitals:   07/19/15 0948  BP: 109/58  Pulse: 93  Temp: 36.6 C  Resp: 16    Level of consciousness: awake, alert  and patient cooperative  Complications: No apparent anesthesia complications

## 2015-07-19 NOTE — Op Note (Signed)
[facility] Patient Name: Diana [name] Procedure Date: 07/19/2015 8:34 AM MRN: [id number] Account #: [id number] Date of Birth: [birth year] Admit Type: Inpatient Age: 80 Room: [facility] ENDO ROOM 4 Gender: Female Note Status: Finalized Procedure:         Upper GI endoscopy Indications:       Acute post hemorrhagic anemia, Melena Providers:         Ezzard Standing. Bluford Kaufmann, MD Referring MD:      Duncan Dull, MD (Referring MD) Medicines:         Monitored Anesthesia Care Complications:     No immediate complications. Procedure:         Pre-Anesthesia Assessment:                    - Prior to the procedure, a History and Physical was                     performed, and patient medications, allergies and                     sensitivities were reviewed. The patient's tolerance of                     previous anesthesia was reviewed.                    - The risks and benefits of the procedure and the sedation                     options and risks were discussed with the patient. All                     questions were answered and informed consent was obtained.                    - After reviewing the risks and benefits, the patient was                     deemed in satisfactory condition to undergo the procedure.                    After obtaining informed consent, the endoscope was passed                     under direct vision. Throughout the procedure, the                     patient's blood pressure, pulse, and oxygen saturations                     were monitored continuously. The Endoscope was introduced                     through the mouth, and advanced to the second part of                     duodenum. The upper GI endoscopy was accomplished without                     difficulty. The patient tolerated the procedure well. Findings:      LA Grade D (one or more mucosal breaks involving at least 75% of       esophageal circumference) esophagitis with bleeding  was found in the  entire esophagus. Biopsies were taken with a cold forceps for histology.      A benign-appearing, intrinsic mild stenosis was found at the       gastroesophageal junction. A TTS dilator was passed through the scope.       Dilation with a 15-16.5-18 mm balloon (to a maximum balloon size of 18       mm) dilator was performed.      The exam was otherwise without abnormality.      A large hiatus hernia was present. G tube in place      The examined duodenum was normal. Impression:        - LA Grade D reflux esophagitis. Biopsied.                    - Benign-appearing esophageal stricture. Dilated.                    - The examination was otherwise normal.                    - Large hiatus hernia.                    - Normal examined duodenum. Recommendation:    - Observe patient's clinical course.                    - Continue present medications.                    - The findings and recommendations were discussed with the                     patient. Procedure Code(s): --- Professional ---                    [phone], Esophagogastroduodenoscopy, flexible, transoral;                     with transendoscopic balloon dilation of esophagus (less                     than 30 mm diameter)                    43239, Esophagogastroduodenoscopy, flexible, transoral;                     with biopsy, single or multiple Diagnosis Code(s): --- Professional ---                    K21.0, Gastro-esophageal reflux disease with esophagitis                    K22.2, Esophageal obstruction                    K44.9, Diaphragmatic hernia without obstruction or gangrene                    D62, Acute posthemorrhagic anemia                    K92.1, Melena CPT copyright 2014 American Medical Association. All rights reserved. The codes documented in this report are preliminary and upon coder review may  be revised to meet current compliance requirements. Wallace Cullens, MD 07/19/2015 8:54:15  AM This report has been signed electronically. Number of Addenda: 0 Note Initiated On: 07/19/2015 8:34 AM  [facility]

## 2015-07-19 NOTE — Op Note (Signed)
Still with nausea. EGD showed persistent, severe esophagitis with very friable mucosa. Mild GE junction stricture with large hiatal hernia present. Esophageal bx as well TTS balloon dilation done. Start clears. Resume TF tonight if no further bleeding. Continue protonix bid. Continue carafate though again not sure how much it will help with esophageal mucosa. Will let Dr. Gustavo Lah know of pt's admission. thanks

## 2015-07-19 NOTE — Progress Notes (Signed)
[facility] Physicians - [place] at [facility]   PATIENT NAME: Diana [name]    MR#:  [id number]  DATE OF BIRTH:  07/29/1935  SUBJECTIVE:  Patient still with significant nausea this morning. Patient underwent EGD which shows severe erosive esophagitis REVIEW OF SYSTEMS:    Review of Systems  Constitutional: Negative for fever, chills and malaise/fatigue.  HENT: Negative for sore throat.   Eyes: Negative for blurred vision.  Respiratory: Negative for cough, hemoptysis, shortness of breath and wheezing.   Cardiovascular: Negative for chest pain, palpitations and leg swelling.  Gastrointestinal: Positive for nausea and abdominal pain. Negative for vomiting, diarrhea and blood in stool.  Genitourinary: Negative for dysuria.  Musculoskeletal: Negative for back pain.  Neurological: Negative for dizziness, tremors and headaches.  Endo/Heme/Allergies: Does not bruise/bleed easily.    Tolerating Diet: Clear liquid     DRUG ALLERGIES:   Allergies  Allergen Reactions  . Amoxicillin Other (See Comments)    Reaction:  Stomach cramps   . Cefuroxime Itching  . Contrast Media [Iodinated Diagnostic Agents] Hives  . Morphine And Related     Reaction: face flushed    . Cephalexin Rash  . Sulfonamide Derivatives Rash  . Tramadol Hcl Rash  . Venlafaxine Rash    VITALS:  Blood pressure 109/58, pulse 93, temperature 97.9 F (36.6 C), temperature source Oral, resp. rate 16, height 5\' 3"  (1.6 m), weight 58.968 kg (130 lb), SpO2 93 %.  PHYSICAL EXAMINATION:   Physical Exam  Constitutional: She is oriented to person, place, and time and well-developed, well-nourished, and in no distress. No distress.  HENT:  Head: Normocephalic.  Eyes: No scleral icterus.  Neck: Normal range of motion. Neck supple. No JVD present. No tracheal deviation present.  Cardiovascular: Normal rate, regular rhythm and normal heart sounds.  Exam reveals no gallop and no friction rub.   No murmur  heard. Pulmonary/Chest: Effort normal and breath sounds normal. No respiratory distress. She has no wheezes. She has no rales. She exhibits no tenderness.  Abdominal: Soft. Bowel sounds are normal. She exhibits no distension and no mass. There is no tenderness. There is no rebound and no guarding.  G-tube  Musculoskeletal: Normal range of motion. She exhibits no edema.  Neurological: She is alert and oriented to person, place, and time.  Skin: Skin is warm. No rash noted. No erythema.  Psychiatric: Affect and judgment normal.      LABORATORY PANEL:   CBC  Recent Labs Lab 07/18/15 0056  07/18/15 1244  WBC 6.9  --   --   HGB 8.3*  < > 8.7*  HCT 25.8*  --   --   PLT 368  --   --   < > = values in this interval not displayed. ------------------------------------------------------------------------------------------------------------------  Chemistries   Recent Labs Lab 07/17/15 1559 07/18/15 0056  NA 139 141  K 3.7 3.7  CL 100* 104  CO2 29 30  GLUCOSE 110* 98  BUN 45* 38*  CREATININE 1.49* 1.32*  CALCIUM 8.1* 7.9*  AST 25  --   ALT 10*  --   ALKPHOS 71  --   BILITOT 0.6  --    ------------------------------------------------------------------------------------------------------------------  Cardiac Enzymes No results for input(s): TROPONINI in the last 168 hours. ------------------------------------------------------------------------------------------------------------------  RADIOLOGY:  Dg Abd 2 Views  07/17/2015  CLINICAL DATA:  Nausea, vomiting and constipation 2 weeks. Surgery for volvulus 2-4 months ago. Check feeding tube. EXAM: ABDOMEN - 2 VIEW COMPARISON:  03/30/2015 and CT 06/08/2015 FINDINGS:  Lung bases demonstrate stable small left effusion. Percutaneous gastrostomy tube is present with tip overlying the left mid abdomen. Interval placement of IVC filter with tip at the L1 level. Bowel gas pattern is nonobstructive with mild fecal retention most notable  over the rectosigmoid colon. No dilated loops of bowel and no free peritoneal air. Moderate degenerative changes of the spine and moderate curvature of the spine unchanged. Degenerative change of the hips. IMPRESSION: Nonobstructive bowel gas pattern with mild fecal retention most prominent over the rectosigmoid colon. Percutaneous gastrostomy tube with tip over the left mid abdomen. Small left pleural effusion unchanged. Electronically Signed   By: Elberta Fortis M.D.   On: 07/17/2015 14:26     ASSESSMENT AND PLAN:   80 year old female with a history of severe reflux esophagitis who presented with drop in hemoglobin and melena.  1. Upper GI bleed: EGD shows persistent and severe esophagitis. She also has a mild GE junction stricture with large hiatal hernia. Patient is started on clear liquid diet and tube feeds. Continue PPI and Carafate.  2. Acute blood loss anemia: Secondary to problem #1. Hemoglobin stable. 3. Protein malnutrition: Patient is G-tube placed and we can restart tube feeds.  4. Hypothyroid: Continue Synthroid  5. Depression: Continue Xanax when necessary   6. History of SLE: Continue  Outpatient medications.   Case discussed with Dr. Bluford Kaufmann  Management plans discussed with the patient and she is in agreement.  CODE STATUS: FULL  TOTAL TIME TAKING CARE OF THIS PATIENT: 25 minutes.     POSSIBLE D/C 1-2 days, DEPENDING ON CLINICAL CONDITION.   Powell Halbert M.D on 07/19/2015 at 11:22 AM  Between 7am to 6pm - Pager - [phone] After 6pm go to [email or web address] - password [email or web address]  [facility]  Office  [phone]  CC: Primary care physician; Sherlene Shams, MD  Note: This dictation was prepared with Dragon dictation along with smaller phrase technology. Any transcriptional errors that result from this process are unintentional.

## 2015-07-19 NOTE — Transfer of Care (Signed)
Immediate Anesthesia Transfer of Care Note  Patient: Diana [name]  Procedure(s) Performed: Procedure(s): ESOPHAGOGASTRODUODENOSCOPY (EGD) (N/A)  Patient Location: PACU  Anesthesia Type:General  Level of Consciousness: awake and alert   Airway & Oxygen Therapy: Patient Spontanous Breathing  Post-op Assessment: Report given to RN  Post vital signs: stable  Last Vitals:  Filed Vitals:   07/19/15 0853  BP: 99/57  Pulse: 96  Temp: 36.5 C  Resp: 16    Complications: No apparent anesthesia complications

## 2015-07-19 NOTE — Progress Notes (Signed)
Talked with pharmacist about another option for cellcept, since it is not supposed to be opened. Awaiting call back from pharmacist.

## 2015-07-20 ENCOUNTER — Telehealth: Payer: Self-pay | Admitting: Internal Medicine

## 2015-07-20 LAB — CBC
HCT: 25.5 % — ABNORMAL LOW (ref 35.0–47.0)
Hemoglobin: 8.3 g/dL — ABNORMAL LOW (ref 12.0–16.0)
MCH: 29.1 pg (ref 26.0–34.0)
MCHC: 32.5 g/dL (ref 32.0–36.0)
MCV: 89.6 fL (ref 80.0–100.0)
Platelets: 325 10*3/uL (ref 150–440)
RBC: 2.85 MIL/uL — ABNORMAL LOW (ref 3.80–5.20)
RDW: 19.5 % — AB (ref 11.5–14.5)
WBC: 5.6 10*3/uL (ref 3.6–11.0)

## 2015-07-20 MED ORDER — SUCRALFATE 1 G PO TABS: 1.0000 g | ORAL_TABLET | Freq: Three times a day (TID) | ORAL | Status: DC

## 2015-07-20 NOTE — Care Management Important Message (Signed)
Important Message  Patient Details  Name: Diana [name] MRN: [id number] Date of Birth: Dec 08, 1934   Medicare Important Message Given:  Yes-second notification given    Juliann Pulse A [place] 07/20/2015, 10:06 AM

## 2015-07-20 NOTE — Telephone Encounter (Signed)
Thank you. Will follow up with patient. 

## 2015-07-20 NOTE — Consult Note (Signed)
  Nausea better. No active bleeding. Being discharged later today. Continue protonix bid and carafate tid. Resume TF at nights. Can stay liquids orally for next few days and advance to soft diet if nausea under control. Have pt f/u with Marguerite Olea, NP in few weeks to repeat hgb and assess how she is doing. See if prednisone dose can be lowered to allow better healing of esophagitis. Await esophageal biopsies. Also, make sure whenver she eats or takes TF to sit up as much as possible. Will sign off. Thanks.

## 2015-07-20 NOTE — Telephone Encounter (Signed)
FYI, HFU pt is being discharged today. Pt is scheduled for 11/7 @3 :30pm. Staff states will advise pt. Thank You!

## 2015-07-20 NOTE — Discharge Summary (Signed)
[facility] Physicians - [place] at [facility]   PATIENT NAME: Diana [name]    MR#:  [id number]  DATE OF BIRTH:  [birth year]  DATE OF ADMISSION:  07/17/2015 ADMITTING PHYSICIAN: Shaune Pollack, MD  DATE OF DISCHARGE: July 20 2015  PRIMARY CARE PHYSICIAN: Sherlene Shams, MD    ADMISSION DIAGNOSIS:  Anemia, unspecified anemia type [D64.9] Gastrointestinal hemorrhage, unspecified gastritis, unspecified gastrointestinal hemorrhage type [K92.2]  DISCHARGE DIAGNOSIS:  Principal Problem:   GIB (gastrointestinal bleeding) Active Problems:   Anemia   ARF (acute renal failure) (HCC)   Protein-calorie malnutrition, severe   SECONDARY DIAGNOSIS:   Past Medical History  Diagnosis Date  . Lupus (systemic lupus erythematosus) (HCC)   . Pulmonary hypertension (HCC)   . Pneumonia   . Unspecified menopausal and postmenopausal disorder   . Primary pulmonary hypertension (HCC)   . Acute glomerulonephritis with other specified pathological lesion in kidney in disease classified elsewhere(580.81)   . Unspecified hypothyroidism   . Shingles (herpes zoster) polyneuropathy March 2013    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  . Hiatal hernia   . DVT (deep venous thrombosis) (HCC)   . Cancer (HCC)     Breast  . CHF (congestive heart failure) (HCC)   . Renal insufficiency     HOSPITAL COURSE:  80 year old female with a history of severe reflux esophagitis who presented with drop in hemoglobin and melena.  1. Upper GI bleed: EGD shows persistent and severe esophagitis. She also has a mild GE junction stricture with large hiatal hernia. Patient was started on clear liquid diet and tube feeds. She may try low residual diet as per GI consultation. Continue PPI and Carafate.  2. Acute blood loss anemia: Secondary to problem #1. Hemoglobin stable. 3. Protein malnutrition: Patient has G-tube placed 4. Hypothyroid: Continue Synthroid  5. Depression: Continue Xanax when necessary   6.  History of SLE: Continue Outpatient medications.    patient is being discharged home in stable condition on a low residual diet  ATreatment Team:  Wallace Cullens, MD  DRUG ALLERGIES:   Allergies  Allergen Reactions  . Amoxicillin Other (See Comments)    Reaction:  Stomach cramps   . Cefuroxime Itching  . Contrast Media [Iodinated Diagnostic Agents] Hives  . Morphine And Related     Reaction: face flushed    . Cephalexin Rash  . Sulfonamide Derivatives Rash  . Tramadol Hcl Rash  . Venlafaxine Rash    DISCHARGE MEDICATIONS:   Current Discharge Medication List    START taking these medications   Details  sucralfate (CARAFATE) 1 G tablet Take 1 tablet (1 g total) by mouth 4 (four) times daily -  with meals and at bedtime. Qty: 120 tablet, Refills: 0      CONTINUE these medications which have NOT CHANGED   Details  acetaminophen (TYLENOL) 325 MG tablet Take 650 mg by mouth every 4 (four) hours as needed for mild pain or fever.    ALPRAZolam (XANAX) 0.25 MG tablet Take 1 tablet (0.25 mg total) by mouth at bedtime as needed for anxiety. Qty: 20 tablet, Refills: 0    Alum & Mag Hydroxide-Simeth (GI COCKTAIL) SUSP suspension Take 30 mLs by mouth every 6 (six) hours as needed (for severe reflux). Also has Lidocaine in it.  Shake well.    alum hydroxide-mag trisilicate (GAVISCON) 80-20 MG CHEW chewable tablet Chew 2 tablets by mouth 4 (four) times daily as needed for indigestion or heartburn.    azelastine (ASTELIN) 0.1 %  nasal spray Place 1 spray into both nostrils 2 (two) times daily.    cyanocobalamin (,VITAMIN B-12,) 1000 MCG/ML injection Inject 1 mL (1,000 mcg total) into the muscle every 30 (thirty) days. Qty: 10 mL, Refills: 1    diclofenac (FLECTOR) 1.3 % PTCH Place 1 patch onto the skin 2 (two) times daily. Qty: 60 patch, Refills: 1    Fluticasone-Salmeterol (ADVAIR) 250-50 MCG/DOSE AEPB Inhale 1 puff into the lungs 2 (two) times daily.    hydroxychloroquine  (PLAQUENIL) 200 MG tablet Take 200 mg by mouth daily.    ipratropium (ATROVENT) 0.03 % nasal spray Place 2 sprays into both nostrils 3 (three) times daily.    letrozole (FEMARA) 2.5 MG tablet Take 2.5 mg by mouth daily.    levocetirizine (XYZAL) 5 MG tablet Take 5 mg by mouth daily.    loperamide (IMODIUM) 2 MG capsule Take 2-4 mg by mouth as needed for diarrhea or loose stools.    mycophenolate (CELLCEPT) 250 MG capsule Take 1,000 mg by mouth 2 (two) times daily.    Nutritional Supplements (FEEDING SUPPLEMENT, JEVITY 1.5 CAL/FIBER,) LIQD Place 1,000 mLs into feeding tube continuous. Qty: 30000 mL, Refills: 1    nystatin (MYCOSTATIN) 100000 UNIT/ML suspension Take 5 mLs (500,000 Units total) by mouth 4 (four) times daily. Qty: 60 mL, Refills: 0    ondansetron (ZOFRAN-ODT) 4 MG disintegrating tablet Take 1 tablet (4 mg total) by mouth every 4 (four) hours as needed for nausea or vomiting. Qty: 60 tablet, Refills: 3    oxyCODONE-acetaminophen (PERCOCET/ROXICET) 5-325 MG per tablet Take 1 tablet by mouth every 6 (six) hours as needed for severe pain. Pt is able to be given PRN doses in addition to scheduled doses. Qty: 20 tablet, Refills: 0    pantoprazole sodium (PROTONIX) 40 mg/20 mL PACK Place 40 mg into feeding tube 2 (two) times daily.    predniSONE (DELTASONE) 5 MG tablet Take 5 mg by mouth daily. Pt takes in addition to the 1mg  tablet.    promethazine (PHENERGAN) 25 MG tablet Take 6.25-12.5 mg by mouth every 4 (four) hours as needed for nausea or vomiting.     SYNTHROID 150 MCG tablet Take 1 tablet (150 mcg total) by mouth daily. Qty: 30 tablet, Refills: 11    Tadalafil, PAH, (ADCIRCA) 20 MG TABS Take 40 mg by mouth daily.    !! Water For Irrigation, Sterile (FREE WATER) SOLN Place 150 mLs into feeding tube 2 (two) times daily. Qty: 1500 mL, Refills: 2    !! Water For Irrigation, Sterile (FREE WATER) SOLN Place 25 mLs into feeding tube continuous. Qty: 1000 mL, Refills: 2     !! Water For Irrigation, Sterile (FREE WATER) SOLN Place 150 mLs into feeding tube 2 (two) times daily. Qty: 1500 mL, Refills: 2     !! - Potential duplicate medications found. Please discuss with provider.    STOP taking these medications     cyclobenzaprine (FLEXERIL) 10 MG tablet      fluticasone (FLONASE) 50 MCG/ACT nasal spray               Today   CHIEF COMPLAINT:  Patient feeling better this morning. Still with some nausea but able to tolerate her diet.   VITAL SIGNS:  Blood pressure 122/57, pulse 88, temperature 98.4 F (36.9 C), temperature source Oral, resp. rate 16, height 5\' 3"  (1.6 m), weight 58.695 kg (129 lb 6.4 oz), SpO2 99 %.   REVIEW OF SYSTEMS:  Review of Systems  Constitutional: Negative for fever, chills and malaise/fatigue.  HENT: Negative for sore throat.   Eyes: Negative for blurred vision.  Respiratory: Negative for cough, hemoptysis, shortness of breath and wheezing.   Cardiovascular: Negative for chest pain, palpitations and leg swelling.  Gastrointestinal: Positive for nausea and abdominal pain. Negative for vomiting, diarrhea and blood in stool.  Genitourinary: Negative for dysuria.  Musculoskeletal: Negative for back pain.  Neurological: Negative for dizziness, tremors and headaches.  Endo/Heme/Allergies: Does not bruise/bleed easily.     PHYSICAL EXAMINATION:  GENERAL:  80 y.o.-year-old patient lying in the bed with no acute distress.  NECK:  Supple, no jugular venous distention. No thyroid enlargement, no tenderness.  LUNGS: Normal breath sounds bilaterally, no wheezing, rales,rhonchi  No use of accessory muscles of respiration.  CARDIOVASCULAR: S1, S2 normal. No murmurs, rubs, or gallops.  ABDOMEN: Soft, non-tender, non-distended. Bowel sounds present. No organomegaly or mass.  EXTREMITIES: No pedal edema, cyanosis, or clubbing.  PSYCHIATRIC: The patient is alert and oriented x 3.  SKIN: No obvious rash, lesion, or ulcer.    DATA REVIEW:   CBC  Recent Labs Lab 07/20/15 0559  WBC 5.6  HGB 8.3*  HCT 25.5*  PLT 325    Chemistries   Recent Labs Lab 07/17/15 1559 07/18/15 0056  NA 139 141  K 3.7 3.7  CL 100* 104  CO2 29 30  GLUCOSE 110* 98  BUN 45* 38*  CREATININE 1.49* 1.32*  CALCIUM 8.1* 7.9*  AST 25  --   ALT 10*  --   ALKPHOS 71  --   BILITOT 0.6  --     Cardiac Enzymes No results for input(s): TROPONINI in the last 168 hours.  Microbiology Results  @MICRORSLT48 @  RADIOLOGY:  No results found.    Management plans discussed with the patient and she is in agreement. Stable for discharge home  Patient should follow up with PCP in one week and GI in 2 weeks  CODE STATUS:     Code Status Orders        Start     Ordered   07/17/15 1842  Full code   Continuous     07/17/15 1842    Advance Directive Documentation        Most Recent Value   Type of Advance Directive  Healthcare Power of Attorney   Pre-existing out of facility DNR order (yellow form or pink MOST form)     "MOST" Form in Place?        TOTAL TIME TAKING CARE OF THIS PATIENT: 35 minutes.    Note: This dictation was prepared with Dragon dictation along with smaller phrase technology. Any transcriptional errors that result from this process are unintentional.  Erina Hamme M.D on 07/20/2015 at 12:59 PM  Between 7am to 6pm - Pager - [phone] After 6pm go to [email or web address] - password [email or web address] [facility]  [place] [facility]  Office  [phone]  CC: Primary care physician; Sherlene Shams, MD

## 2015-07-21 ENCOUNTER — Telehealth: Payer: Self-pay

## 2015-07-21 ENCOUNTER — Encounter: Payer: Self-pay | Admitting: Gastroenterology

## 2015-07-21 DIAGNOSIS — B370 Candidal stomatitis: Secondary | ICD-10-CM | POA: Diagnosis not present

## 2015-07-21 DIAGNOSIS — Z431 Encounter for attention to gastrostomy: Secondary | ICD-10-CM | POA: Diagnosis not present

## 2015-07-21 DIAGNOSIS — M3213 Lung involvement in systemic lupus erythematosus: Secondary | ICD-10-CM | POA: Diagnosis not present

## 2015-07-21 DIAGNOSIS — E119 Type 2 diabetes mellitus without complications: Secondary | ICD-10-CM | POA: Diagnosis not present

## 2015-07-21 DIAGNOSIS — G609 Hereditary and idiopathic neuropathy, unspecified: Secondary | ICD-10-CM | POA: Diagnosis not present

## 2015-07-21 DIAGNOSIS — K922 Gastrointestinal hemorrhage, unspecified: Secondary | ICD-10-CM | POA: Diagnosis not present

## 2015-07-21 NOTE — Telephone Encounter (Signed)
Transition Care Management Follow-up Telephone Call   Date discharged? 07/20/15   How have you been since you were released from the hospital? Some nausea still, but able to tolerate diet.  I think I am doing pretty good.  Do you understand why you were in the hospital? Yes for bleeding again.     Do you understand the discharge instructions? Yes and I am watching BM for any blood in stool.   Where were you discharged to? Home   Items Reviewed:  Medications reviewed: Yes, taking the [place] is helping with the nausea.  No longer taking the FLEXERIL or FLONASE per doctor's orders.  Allergies reviewed: Yes, no change.  Dietary changes reviewed: Yes, tolerating ok, but some diarrhea at times.  Referrals reviewed: Yes, no change.   Functional Questionnaire:   Activities of Daily Living (ADLs):   She states they are independent in the following: toileting, grooming States they require assistance with the following: Ambulating (uses walker), bathing, dressing, tube feedings.  Home health to assist.  Son currently assists.   Any transportation issues/concerns?: No   Any patient concerns? Concerns of bleeding again. GI appointment scheduled in 2 weeks.   Confirmed importance and date/time of follow-up visits scheduled Yes, appointment scheduled 07-27-15 at 3:30pm  Provider Appointment booked with Dr. Derrel Nip (PCP).  Confirmed with patient if condition begins to worsen call PCP or go to the ER.  Patient was given the office number and encouraged to call back with question or concerns.  : Yes, patient verbalized understanding.

## 2015-07-21 NOTE — Care Management (Signed)
Post discharge: received call from [facility] with [place]. Patient was open to [facility] and [place] with [facility]. I have notified Dr. Benjie Karvonen of resumption order need.

## 2015-07-27 ENCOUNTER — Ambulatory Visit (INDEPENDENT_AMBULATORY_CARE_PROVIDER_SITE_OTHER): Payer: Medicare Other | Admitting: Internal Medicine

## 2015-07-27 ENCOUNTER — Encounter: Payer: Self-pay | Admitting: Internal Medicine

## 2015-07-27 VITALS — BP 108/70 | HR 91 | Temp 98.9°F | Resp 14 | Ht 63.0 in | Wt 129.0 lb

## 2015-07-27 DIAGNOSIS — N179 Acute kidney failure, unspecified: Secondary | ICD-10-CM | POA: Diagnosis not present

## 2015-07-27 DIAGNOSIS — E559 Vitamin D deficiency, unspecified: Secondary | ICD-10-CM | POA: Diagnosis not present

## 2015-07-27 DIAGNOSIS — B370 Candidal stomatitis: Secondary | ICD-10-CM | POA: Diagnosis not present

## 2015-07-27 DIAGNOSIS — G609 Hereditary and idiopathic neuropathy, unspecified: Secondary | ICD-10-CM | POA: Diagnosis not present

## 2015-07-27 DIAGNOSIS — D509 Iron deficiency anemia, unspecified: Secondary | ICD-10-CM

## 2015-07-27 DIAGNOSIS — M3213 Lung involvement in systemic lupus erythematosus: Secondary | ICD-10-CM | POA: Diagnosis not present

## 2015-07-27 DIAGNOSIS — Z431 Encounter for attention to gastrostomy: Secondary | ICD-10-CM | POA: Diagnosis not present

## 2015-07-27 DIAGNOSIS — J321 Chronic frontal sinusitis: Secondary | ICD-10-CM | POA: Diagnosis not present

## 2015-07-27 DIAGNOSIS — K922 Gastrointestinal hemorrhage, unspecified: Secondary | ICD-10-CM | POA: Diagnosis not present

## 2015-07-27 DIAGNOSIS — E119 Type 2 diabetes mellitus without complications: Secondary | ICD-10-CM | POA: Diagnosis not present

## 2015-07-27 MED ORDER — LEVOFLOXACIN 25 MG/ML PO SOLN: 375.0000 mg | Freq: Every day | ORAL | Status: DC

## 2015-07-27 MED ORDER — OXYCODONE-ACETAMINOPHEN 5-325 MG PO TABS: 1.0000 | ORAL_TABLET | Freq: Every day | ORAL | Status: DC | PRN

## 2015-07-27 NOTE — Progress Notes (Signed)
Pre visit review using our clinic review tool, if applicable. No additional management support is needed unless otherwise documented below in the visit note. 

## 2015-07-27 NOTE — Patient Instructions (Addendum)
I am prescribing levaquin, a  liquid form of the antibiotic to take for 7 days for your sinusitis   You can also use Afrin nasal spray for a few days  The medicine should be put in the tube,  15 ml daily    Please take a probiotic ( Align, Floraque or Culturelle) of the generic version of one of these  For a minimum of 3 weeks to prevent a serious antibiotic associated diarrhea  Called clostridium dificile colitis   I believe your anemia would respond to iron infusions. We will find out where the iron infusion can be given. It may require a referral to Hematology

## 2015-07-27 NOTE — Progress Notes (Signed)
Subjective:  Patient ID: Diana [name], female    DOB: Sep 30, 1934  Age: 80 y.o. MRN: [id number]  CC: The primary encounter diagnosis was Anemia, iron deficiency. Diagnoses of Vitamin D deficiency, Acute renal failure, unspecified acute renal failure type (HCC), and Sinusitis chronic, frontal were also pertinent to this visit. .  Patient was admitted  HPI Diana [name] presents for hospital follow up .  She was admitted to [facility] on Oct 28th with melena accompanied by an acute drop in hgb.  underwent EGD showing severe esophagitis, GE stricture , large HH,  Treated wit PPI and Carafate.  G tube feeds were continued and clear liquid oral diet started prior to  discharge on Oct 31.  Has been having low grade fevers in the afternoons to 101.  No workup doene. Has been going on for several weeks.  She endorses bilateral sinus pain and purulent nasal discharge for the past week.  Her Tube feedings gong well ,  Has had stable weight and positive 6 lbs since September.   Cr was 1.49 on oct 28 and hgb was 7.2  Received one unit of blood.  No iron tranfsusions have been given,  .    She has not seen her nephrologist [facility] in many months due to recurrent hosptitalization for esophagitis and GI bleeds  Using percocet as needed once daily or les.  Refill requested      Outpatient Prescriptions Prior to Visit  Medication Sig Dispense Refill  . acetaminophen (TYLENOL) 325 MG tablet Take 650 mg by mouth every 4 (four) hours as needed for mild pain or fever.    . ALPRAZolam (XANAX) 0.25 MG tablet Take 1 tablet (0.25 mg total) by mouth at bedtime as needed for anxiety. 20 tablet 0  . Alum & Mag Hydroxide-Simeth (GI COCKTAIL) SUSP suspension Take 30 mLs by mouth every 6 (six) hours as needed (for severe reflux). Also has Lidocaine in it.  Shake well.    Marland Kitchen alum hydroxide-mag trisilicate (GAVISCON) 80-20 MG CHEW chewable tablet Chew 2 tablets by mouth 4 (four) times daily as needed for indigestion or  heartburn.    Marland Kitchen azelastine (ASTELIN) 0.1 % nasal spray Place 1 spray into both nostrils 2 (two) times daily.    . cyanocobalamin (,VITAMIN B-12,) 1000 MCG/ML injection Inject 1 mL (1,000 mcg total) into the muscle every 30 (thirty) days. (Patient taking differently: Inject 1,000 mcg into the muscle every 30 (thirty) days. Pt uses on the 1st of every month.) 10 mL 1  . diclofenac (FLECTOR) 1.3 % PTCH Place 1 patch onto the skin 2 (two) times daily. 60 patch 1  . Fluticasone-Salmeterol (ADVAIR) 250-50 MCG/DOSE AEPB Inhale 1 puff into the lungs 2 (two) times daily.    . hydroxychloroquine (PLAQUENIL) 200 MG tablet Take 200 mg by mouth daily.    Marland Kitchen ipratropium (ATROVENT) 0.03 % nasal spray Place 2 sprays into both nostrils 3 (three) times daily.    Marland Kitchen letrozole (FEMARA) 2.5 MG tablet Take 2.5 mg by mouth daily.    Marland Kitchen levocetirizine (XYZAL) 5 MG tablet Take 5 mg by mouth daily.    Marland Kitchen loperamide (IMODIUM) 2 MG capsule Take 2-4 mg by mouth as needed for diarrhea or loose stools.    . mycophenolate (CELLCEPT) 250 MG capsule Take 1,000 mg by mouth 2 (two) times daily.    . Nutritional Supplements (FEEDING SUPPLEMENT, JEVITY 1.5 CAL/FIBER,) LIQD Place 1,000 mLs into feeding tube continuous. 30000 mL 1  . nystatin (MYCOSTATIN)  100000 UNIT/ML suspension Take 5 mLs (500,000 Units total) by mouth 4 (four) times daily. 60 mL 0  . ondansetron (ZOFRAN-ODT) 4 MG disintegrating tablet Take 1 tablet (4 mg total) by mouth every 4 (four) hours as needed for nausea or vomiting. 60 tablet 3  . pantoprazole sodium (PROTONIX) 40 mg/20 mL PACK Place 40 mg into feeding tube 2 (two) times daily.    . predniSONE (DELTASONE) 5 MG tablet Take 5 mg by mouth daily. Pt takes in addition to the 1mg  tablet.    . promethazine (PHENERGAN) 25 MG tablet Take 6.25-12.5 mg by mouth every 4 (four) hours as needed for nausea or vomiting.     . sucralfate (CARAFATE) 1 G tablet Take 1 tablet (1 g total) by mouth 4 (four) times daily -  with meals  and at bedtime. 120 tablet 0  . SYNTHROID 150 MCG tablet Take 1 tablet (150 mcg total) by mouth daily. 30 tablet 11  . Tadalafil, PAH, (ADCIRCA) 20 MG TABS Take 40 mg by mouth daily.    . Water For Irrigation, Sterile (FREE WATER) SOLN Place 150 mLs into feeding tube 2 (two) times daily. 1500 mL 2  . Water For Irrigation, Sterile (FREE WATER) SOLN Place 25 mLs into feeding tube continuous. 1000 mL 2  . Water For Irrigation, Sterile (FREE WATER) SOLN Place 150 mLs into feeding tube 2 (two) times daily. 1500 mL 2  . oxyCODONE-acetaminophen (PERCOCET/ROXICET) 5-325 MG per tablet Take 1 tablet by mouth every 6 (six) hours as needed for severe pain. Pt is able to be given PRN doses in addition to scheduled doses. 20 tablet 0   No facility-administered medications prior to visit.    Review of Systems;  Patient denies headache, fevers, malaise, unintentional weight loss, skin rash, eye pain, sinus congestion and sinus pain, sore throat, dysphagia,  hemoptysis , cough, dyspnea, wheezing, chest pain, palpitations, orthopnea, edema, abdominal pain, nausea, melena, diarrhea, constipation, flank pain, dysuria, hematuria, urinary  Frequency, nocturia, numbness, tingling, seizures,  Focal weakness, Loss of consciousness,  Tremor, insomnia, depression, anxiety, and suicidal ideation.      Objective:  BP 108/70 mmHg  Pulse 91  Temp(Src) 98.9 F (37.2 C) (Oral)  Resp 14  Ht 5\' 3"  (1.6 m)  Wt 129 lb (58.514 kg)  BMI 22.86 kg/m2  SpO2 95%  BP Readings from Last 3 Encounters:  07/27/15 108/70  07/20/15 122/57  07/03/15 102/58    Wt Readings from Last 3 Encounters:  07/27/15 129 lb (58.514 kg)  07/20/15 129 lb 6.4 oz (58.695 kg)  07/03/15 126 lb 4 oz (57.267 kg)    General appearance: alert, cooperative and appears stated age Ears: normal TM's and external ear canals both ears Throat: lips, mucosa, and tongue normal; teeth and gums normal Neck: no adenopathy, no carotid bruit, supple,  symmetrical, trachea midline and thyroid not enlarged, symmetric, no tenderness/mass/nodules Back: symmetric, no curvature. ROM normal. No CVA tenderness. Lungs: clear to auscultation bilaterally Heart: regular rate and rhythm, S1, S2 normal, no murmur, click, rub or gallop Abdomen: soft, non-tender; bowel sounds normal; no masses,  no organomegaly Pulses: 2+ and symmetric Skin: Skin color, texture, turgor normal. No rashes or lesions Lymph nodes: Cervical, supraclavicular, and axillary nodes normal.  Lab Results  Component Value Date   HGBA1C 5.9 07/03/2015   HGBA1C 7.1* 11/19/2014   HGBA1C 6.9* 04/22/2014    Lab Results  Component Value Date   CREATININE 1.01 07/27/2015   CREATININE 1.32* 07/18/2015   CREATININE  1.49* 07/17/2015    Lab Results  Component Value Date   WBC 8.0 07/27/2015   HGB 9.7* 07/27/2015   HCT 30.8* 07/27/2015   PLT 368.0 07/27/2015   GLUCOSE 102* 07/27/2015   CHOL 164 10/11/2013   TRIG 152.0* 10/11/2013   HDL 69.70 10/11/2013   LDLDIRECT 28.0 07/03/2015   LDLCALC 64 10/11/2013   ALT 7 07/27/2015   AST 19 07/27/2015   NA 138 07/27/2015   K 4.1 07/27/2015   CL 99 07/27/2015   CREATININE 1.01 07/27/2015   BUN 18 07/27/2015   CO2 29 07/27/2015   TSH 2.916 04/01/2015   INR 1.22 06/10/2015   HGBA1C 5.9 07/03/2015   MICROALBUR <0.7 11/28/2014    Dg Abd 2 Views  07/17/2015  CLINICAL DATA:  Nausea, vomiting and constipation 2 weeks. Surgery for volvulus 2-4 months ago. Check feeding tube. EXAM: ABDOMEN - 2 VIEW COMPARISON:  03/30/2015 and CT 06/08/2015 FINDINGS: Lung bases demonstrate stable small left effusion. Percutaneous gastrostomy tube is present with tip overlying the left mid abdomen. Interval placement of IVC filter with tip at the L1 level. Bowel gas pattern is nonobstructive with mild fecal retention most notable over the rectosigmoid colon. No dilated loops of bowel and no free peritoneal air. Moderate degenerative changes of the spine and  moderate curvature of the spine unchanged. Degenerative change of the hips. IMPRESSION: Nonobstructive bowel gas pattern with mild fecal retention most prominent over the rectosigmoid colon. Percutaneous gastrostomy tube with tip over the left mid abdomen. Small left pleural effusion unchanged. Electronically Signed   By: Elberta Fortis M.D.   On: 07/17/2015 14:26    Assessment & Plan:   Problem List Items Addressed This Visit    Anemia, iron deficiency - Primary    Secondary to recurrent GI bleeds.  She was transfused one unit  Of PRBCs last week  For hgb 7.2 during admission and  is unable to tolerate oral iron supplements due to nausea.  Will refer to Hematology for IV iron infusion.   CBC Latest Ref Rng 07/27/2015 07/20/2015 07/18/2015  WBC 4.0 - 10.5 K/uL 8.0 5.6 -  Hemoglobin 12.0 - 15.0 g/dL 1.0(U) 8.3(L) 8.7(L)  Hematocrit 36.0 - 46.0 % 30.8(L) 25.5(L) -  Platelets 150.0 - 400.0 K/uL 368.0 325 -         Relevant Orders   CBC with Differential/Platelet (Completed)   Ambulatory referral to Hematology   ARF (acute renal failure) (HCC)    Secondary to volume loss, now improved.   Lab Results  Component Value Date   CREATININE 1.01 07/27/2015   Lab Results  Component Value Date   NA 138 07/27/2015   K 4.1 07/27/2015   CL 99 07/27/2015   CO2 29 07/27/2015         Relevant Orders   Comprehensive metabolic panel (Completed)   Sinusitis chronic, frontal    Complicated by immunsuppression witih placquenil and prednisone. Treating with liquid levaquin via G tube . Daily use of Probiotics for  3 weeks advised to reduce risk of C dificile colitis.       Relevant Medications   levofloxacin (LEVAQUIN) 25 MG/ML solution    Other Visit Diagnoses    Vitamin D deficiency        Relevant Orders    Vit D  25 hydroxy (rtn osteoporosis monitoring) (Completed)       I have changed Ms. Iracheta's oxyCODONE-acetaminophen. I am also having her start on levofloxacin. Additionally, I am  having her  maintain her cyanocobalamin, SYNTHROID, hydroxychloroquine, letrozole, Tadalafil (PAH), acetaminophen, Fluticasone-Salmeterol, loperamide, azelastine, alum hydroxide-mag trisilicate, gi cocktail, ipratropium, levocetirizine, mycophenolate, promethazine, predniSONE, pantoprazole sodium, nystatin, feeding supplement (JEVITY 1.5 CAL/FIBER), ALPRAZolam, free water, free water, free water, diclofenac, ondansetron, and sucralfate.  Meds ordered this encounter  Medications  . levofloxacin (LEVAQUIN) 25 MG/ML solution    Sig: Place 15 mLs (375 mg total) into feeding tube daily.    Dispense:  105 mL    Refill:  0  . oxyCODONE-acetaminophen (PERCOCET/ROXICET) 5-325 MG tablet    Sig: Take 1 tablet by mouth daily as needed for severe pain. Pt is able to be given PRN doses in addition to scheduled doses.    Dispense:  30 tablet    Refill:  0    Medications Discontinued During This Encounter  Medication Reason  . oxyCODONE-acetaminophen (PERCOCET/ROXICET) 5-325 MG per tablet Reorder    Follow-up: No Follow-up on file.   Sherlene Shams, MD

## 2015-07-28 DIAGNOSIS — I5032 Chronic diastolic (congestive) heart failure: Secondary | ICD-10-CM | POA: Diagnosis not present

## 2015-07-28 DIAGNOSIS — I272 Other secondary pulmonary hypertension: Secondary | ICD-10-CM | POA: Diagnosis not present

## 2015-07-28 DIAGNOSIS — J321 Chronic frontal sinusitis: Secondary | ICD-10-CM | POA: Insufficient documentation

## 2015-07-28 DIAGNOSIS — J449 Chronic obstructive pulmonary disease, unspecified: Secondary | ICD-10-CM | POA: Diagnosis not present

## 2015-07-28 DIAGNOSIS — I480 Paroxysmal atrial fibrillation: Secondary | ICD-10-CM | POA: Diagnosis not present

## 2015-07-28 DIAGNOSIS — K449 Diaphragmatic hernia without obstruction or gangrene: Secondary | ICD-10-CM | POA: Diagnosis not present

## 2015-07-28 LAB — CBC WITH DIFFERENTIAL/PLATELET
BASOS ABS: 0 10*3/uL (ref 0.0–0.1)
BASOS PCT: 0.5 % (ref 0.0–3.0)
EOS ABS: 0.1 10*3/uL (ref 0.0–0.7)
Eosinophils Relative: 0.7 % (ref 0.0–5.0)
HEMATOCRIT: 30.8 % — AB (ref 36.0–46.0)
Hemoglobin: 9.7 g/dL — ABNORMAL LOW (ref 12.0–15.0)
LYMPHS ABS: 1.8 10*3/uL (ref 0.7–4.0)
LYMPHS PCT: 22.4 % (ref 12.0–46.0)
MCHC: 31.5 g/dL (ref 30.0–36.0)
MCV: 89.3 fl (ref 78.0–100.0)
MONO ABS: 0.8 10*3/uL (ref 0.1–1.0)
Monocytes Relative: 9.9 % (ref 3.0–12.0)
NEUTROS ABS: 5.3 10*3/uL (ref 1.4–7.7)
NEUTROS PCT: 66.5 % (ref 43.0–77.0)
PLATELETS: 368 10*3/uL (ref 150.0–400.0)
RBC: 3.45 Mil/uL — ABNORMAL LOW (ref 3.87–5.11)
RDW: 20.2 % — AB (ref 11.5–15.5)
WBC: 8 10*3/uL (ref 4.0–10.5)

## 2015-07-28 LAB — COMPREHENSIVE METABOLIC PANEL
ALBUMIN: 3 g/dL — AB (ref 3.5–5.2)
ALT: 7 U/L (ref 0–35)
AST: 19 U/L (ref 0–37)
Alkaline Phosphatase: 60 U/L (ref 39–117)
BUN: 18 mg/dL (ref 6–23)
CALCIUM: 9 mg/dL (ref 8.4–10.5)
CHLORIDE: 99 meq/L (ref 96–112)
CO2: 29 meq/L (ref 19–32)
Creatinine, Ser: 1.01 mg/dL (ref 0.40–1.20)
GFR: 56.25 mL/min — ABNORMAL LOW (ref >60.00)
Glucose, Bld: 102 mg/dL — ABNORMAL HIGH (ref 70–99)
POTASSIUM: 4.1 meq/L (ref 3.5–5.1)
SODIUM: 138 meq/L (ref 135–145)
Total Bilirubin: 0.3 mg/dL (ref 0.2–1.2)
Total Protein: 6.1 g/dL (ref 6.0–8.3)

## 2015-07-28 LAB — VITAMIN D 25 HYDROXY (VIT D DEFICIENCY, FRACTURES): VITD: 26.56 ng/mL — AB (ref 30.00–100.00)

## 2015-07-28 NOTE — Assessment & Plan Note (Addendum)
Complicated by immunsuppression witih placquenil and prednisone. Treating with liquid levaquin via G tube . Daily use of Probiotics for  3 weeks advised to reduce risk of C dificile colitis.

## 2015-07-28 NOTE — Assessment & Plan Note (Addendum)
Secondary to recurrent GI bleeds.  She was transfused one unit  Of PRBCs last week  For hgb 7.2 during admission and  is unable to tolerate oral iron supplements due to nausea.  Will refer to Hematology for IV iron infusion.   CBC Latest Ref Rng 07/27/2015 07/20/2015 07/18/2015  WBC 4.0 - 10.5 K/uL 8.0 5.6 -  Hemoglobin 12.0 - 15.0 g/dL 9.7(L) 8.3(L) 8.7(L)  Hematocrit 36.0 - 46.0 % 30.8(L) 25.5(L) -  Platelets 150.0 - 400.0 K/uL 368.0 325 -

## 2015-07-28 NOTE — Assessment & Plan Note (Signed)
Secondary to volume loss, now improved.   Lab Results  Component Value Date   CREATININE 1.01 07/27/2015   Lab Results  Component Value Date   NA 138 07/27/2015   K 4.1 07/27/2015   CL 99 07/27/2015   CO2 29 07/27/2015

## 2015-07-30 DIAGNOSIS — I890 Lymphedema, not elsewhere classified: Secondary | ICD-10-CM | POA: Diagnosis not present

## 2015-07-30 DIAGNOSIS — I279 Pulmonary heart disease, unspecified: Secondary | ICD-10-CM | POA: Diagnosis not present

## 2015-07-30 DIAGNOSIS — M7989 Other specified soft tissue disorders: Secondary | ICD-10-CM | POA: Diagnosis not present

## 2015-07-30 DIAGNOSIS — I824Y9 Acute embolism and thrombosis of unspecified deep veins of unspecified proximal lower extremity: Secondary | ICD-10-CM | POA: Diagnosis not present

## 2015-07-30 DIAGNOSIS — K922 Gastrointestinal hemorrhage, unspecified: Secondary | ICD-10-CM | POA: Diagnosis not present

## 2015-07-30 DIAGNOSIS — I80291 Phlebitis and thrombophlebitis of other deep vessels of right lower extremity: Secondary | ICD-10-CM | POA: Diagnosis not present

## 2015-08-03 ENCOUNTER — Encounter: Payer: Self-pay | Admitting: *Deleted

## 2015-08-04 ENCOUNTER — Ambulatory Visit: Admit: 2015-08-04 | Payer: Medicare Other | Admitting: Gastroenterology

## 2015-08-04 ENCOUNTER — Ambulatory Visit: Admission: RE | Admit: 2015-08-04 | Payer: Medicare Other | Source: Ambulatory Visit | Admitting: Gastroenterology

## 2015-08-04 ENCOUNTER — Encounter: Admission: RE | Payer: Self-pay | Source: Ambulatory Visit

## 2015-08-04 DIAGNOSIS — E119 Type 2 diabetes mellitus without complications: Secondary | ICD-10-CM | POA: Diagnosis not present

## 2015-08-04 DIAGNOSIS — Z431 Encounter for attention to gastrostomy: Secondary | ICD-10-CM | POA: Diagnosis not present

## 2015-08-04 DIAGNOSIS — K922 Gastrointestinal hemorrhage, unspecified: Secondary | ICD-10-CM | POA: Diagnosis not present

## 2015-08-04 DIAGNOSIS — M3213 Lung involvement in systemic lupus erythematosus: Secondary | ICD-10-CM | POA: Diagnosis not present

## 2015-08-04 DIAGNOSIS — G609 Hereditary and idiopathic neuropathy, unspecified: Secondary | ICD-10-CM | POA: Diagnosis not present

## 2015-08-04 DIAGNOSIS — B370 Candidal stomatitis: Secondary | ICD-10-CM | POA: Diagnosis not present

## 2015-08-04 HISTORY — DX: Hereditary and idiopathic neuropathy, unspecified: G60.9

## 2015-08-04 HISTORY — DX: Diaphragmatic hernia without obstruction or gangrene: K44.9

## 2015-08-04 HISTORY — DX: Essential (primary) hypertension: I10

## 2015-08-04 HISTORY — DX: Other general symptoms and signs: R68.89

## 2015-08-04 HISTORY — DX: Vitamin B deficiency, unspecified: E53.9

## 2015-08-04 HISTORY — DX: Incontinence without sensory awareness: N39.42

## 2015-08-04 HISTORY — DX: Sciatica, unspecified side: M54.30

## 2015-08-04 HISTORY — DX: Zoster without complications: B02.9

## 2015-08-04 HISTORY — DX: Gastro-esophageal reflux disease without esophagitis: K21.9

## 2015-08-04 HISTORY — DX: Other abnormalities of breathing: R06.89

## 2015-08-04 HISTORY — DX: Hyperlipidemia, unspecified: E78.5

## 2015-08-04 SURGERY — ESOPHAGOGASTRODUODENOSCOPY (EGD) WITH PROPOFOL
Anesthesia: General

## 2015-08-05 LAB — SURGICAL PATHOLOGY

## 2015-08-11 DIAGNOSIS — G609 Hereditary and idiopathic neuropathy, unspecified: Secondary | ICD-10-CM | POA: Diagnosis not present

## 2015-08-11 DIAGNOSIS — M3213 Lung involvement in systemic lupus erythematosus: Secondary | ICD-10-CM | POA: Diagnosis not present

## 2015-08-11 DIAGNOSIS — E119 Type 2 diabetes mellitus without complications: Secondary | ICD-10-CM | POA: Diagnosis not present

## 2015-08-11 DIAGNOSIS — B370 Candidal stomatitis: Secondary | ICD-10-CM | POA: Diagnosis not present

## 2015-08-11 DIAGNOSIS — K922 Gastrointestinal hemorrhage, unspecified: Secondary | ICD-10-CM | POA: Diagnosis not present

## 2015-08-11 DIAGNOSIS — Z431 Encounter for attention to gastrostomy: Secondary | ICD-10-CM | POA: Diagnosis not present

## 2015-08-17 ENCOUNTER — Encounter: Payer: Self-pay | Admitting: Oncology

## 2015-08-17 ENCOUNTER — Inpatient Hospital Stay (HOSPITAL_BASED_OUTPATIENT_CLINIC_OR_DEPARTMENT_OTHER): Payer: Medicare Other | Admitting: Oncology

## 2015-08-17 ENCOUNTER — Inpatient Hospital Stay: Payer: Medicare Other | Attending: Oncology

## 2015-08-17 VITALS — BP 105/66 | HR 85 | Temp 97.3°F | Wt 131.2 lb

## 2015-08-17 DIAGNOSIS — I10 Essential (primary) hypertension: Secondary | ICD-10-CM | POA: Diagnosis not present

## 2015-08-17 DIAGNOSIS — K219 Gastro-esophageal reflux disease without esophagitis: Secondary | ICD-10-CM | POA: Diagnosis not present

## 2015-08-17 DIAGNOSIS — I509 Heart failure, unspecified: Secondary | ICD-10-CM | POA: Insufficient documentation

## 2015-08-17 DIAGNOSIS — Z923 Personal history of irradiation: Secondary | ICD-10-CM

## 2015-08-17 DIAGNOSIS — Z79811 Long term (current) use of aromatase inhibitors: Secondary | ICD-10-CM | POA: Diagnosis not present

## 2015-08-17 DIAGNOSIS — E039 Hypothyroidism, unspecified: Secondary | ICD-10-CM | POA: Diagnosis not present

## 2015-08-17 DIAGNOSIS — C50412 Malignant neoplasm of upper-outer quadrant of left female breast: Secondary | ICD-10-CM

## 2015-08-17 DIAGNOSIS — E785 Hyperlipidemia, unspecified: Secondary | ICD-10-CM | POA: Insufficient documentation

## 2015-08-17 DIAGNOSIS — M329 Systemic lupus erythematosus, unspecified: Secondary | ICD-10-CM | POA: Diagnosis not present

## 2015-08-17 DIAGNOSIS — C50919 Malignant neoplasm of unspecified site of unspecified female breast: Secondary | ICD-10-CM

## 2015-08-17 DIAGNOSIS — G629 Polyneuropathy, unspecified: Secondary | ICD-10-CM | POA: Insufficient documentation

## 2015-08-17 DIAGNOSIS — Z8619 Personal history of other infectious and parasitic diseases: Secondary | ICD-10-CM | POA: Insufficient documentation

## 2015-08-17 DIAGNOSIS — Z170 Estrogen receptor positive status [ER+]: Secondary | ICD-10-CM

## 2015-08-17 DIAGNOSIS — C50912 Malignant neoplasm of unspecified site of left female breast: Secondary | ICD-10-CM | POA: Insufficient documentation

## 2015-08-17 DIAGNOSIS — Z87891 Personal history of nicotine dependence: Secondary | ICD-10-CM | POA: Diagnosis not present

## 2015-08-17 DIAGNOSIS — E119 Type 2 diabetes mellitus without complications: Secondary | ICD-10-CM | POA: Diagnosis not present

## 2015-08-17 DIAGNOSIS — Z23 Encounter for immunization: Secondary | ICD-10-CM | POA: Diagnosis not present

## 2015-08-17 DIAGNOSIS — S060XAA Concussion with loss of consciousness status unknown, initial encounter: Secondary | ICD-10-CM | POA: Insufficient documentation

## 2015-08-17 DIAGNOSIS — Z86010 Personal history of colonic polyps: Secondary | ICD-10-CM | POA: Insufficient documentation

## 2015-08-17 DIAGNOSIS — Z79899 Other long term (current) drug therapy: Secondary | ICD-10-CM | POA: Insufficient documentation

## 2015-08-17 DIAGNOSIS — S060X9A Concussion with loss of consciousness of unspecified duration, initial encounter: Secondary | ICD-10-CM | POA: Insufficient documentation

## 2015-08-17 LAB — COMPREHENSIVE METABOLIC PANEL
ALBUMIN: 2.8 g/dL — AB (ref 3.5–5.0)
ALK PHOS: 65 U/L (ref 38–126)
ALT: 7 U/L — AB (ref 14–54)
ANION GAP: 10 (ref 5–15)
AST: 18 U/L (ref 15–41)
BUN: 22 mg/dL — ABNORMAL HIGH (ref 6–20)
CALCIUM: 8.4 mg/dL — AB (ref 8.9–10.3)
CHLORIDE: 98 mmol/L — AB (ref 101–111)
CO2: 29 mmol/L (ref 22–32)
Creatinine, Ser: 1.37 mg/dL — ABNORMAL HIGH (ref 0.44–1.00)
GFR calc non Af Amer: 36 mL/min — ABNORMAL LOW (ref >60)
GFR, EST AFRICAN AMERICAN: 42 mL/min — AB (ref >60)
GLUCOSE: 132 mg/dL — AB (ref 65–99)
Potassium: 4.1 mmol/L (ref 3.5–5.1)
SODIUM: 137 mmol/L (ref 135–145)
Total Bilirubin: 0.4 mg/dL (ref 0.3–1.2)
Total Protein: 6.1 g/dL — ABNORMAL LOW (ref 6.5–8.1)

## 2015-08-17 LAB — CBC WITH DIFFERENTIAL/PLATELET
BASOS PCT: 1 %
Basophils Absolute: 0.1 10*3/uL (ref 0–0.1)
EOS ABS: 0.1 10*3/uL (ref 0–0.7)
EOS PCT: 1 %
HCT: 29.4 % — ABNORMAL LOW (ref 35.0–47.0)
HEMOGLOBIN: 9.3 g/dL — AB (ref 12.0–16.0)
Lymphocytes Relative: 15 %
Lymphs Abs: 0.9 10*3/uL — ABNORMAL LOW (ref 1.0–3.6)
MCH: 27.3 pg (ref 26.0–34.0)
MCHC: 31.6 g/dL — ABNORMAL LOW (ref 32.0–36.0)
MCV: 86.6 fL (ref 80.0–100.0)
Monocytes Absolute: 0.6 10*3/uL (ref 0.2–0.9)
Monocytes Relative: 10 %
NEUTROS PCT: 73 %
Neutro Abs: 4.4 10*3/uL (ref 1.4–6.5)
PLATELETS: 319 10*3/uL (ref 150–440)
RBC: 3.39 MIL/uL — AB (ref 3.80–5.20)
RDW: 17.4 % — ABNORMAL HIGH (ref 11.5–14.5)
WBC: 6.1 10*3/uL (ref 3.6–11.0)

## 2015-08-17 MED ORDER — INFLUENZA VAC SPLIT QUAD 0.5 ML IM SUSY
0.5000 mL | PREFILLED_SYRINGE | Freq: Once | INTRAMUSCULAR | Status: AC
  Administered 2015-08-17: 0.5 mL via INTRAMUSCULAR
  Filled 2015-08-17: qty 0.5

## 2015-08-17 NOTE — Progress Notes (Signed)
Cancer Center @ [facility] [phone]  [phone]     Diana [name] OB: Apr 30, 1935  MR#: [id number]  AOZ#:[id number]  Patient Care Team: Sherlene Shams, MD as PCP - General (Internal Medicine) Earline Mayotte, MD (General Surgery) Darci Current, MD as Attending Physician (Emergency Medicine)  CHIEF COMPLAINT:  Chief Complaint  Patient presents with  . Breast Cancer   Chief Complaint/Diagnosis:   80 year old female with stage I (T1 C. N0 M0) invasive mammary carcinoma status post wide local excision and sentinel node biopsy tumor is ER/PR positive HER-2/neu not over expressed with a low Oncotype DX score of 17 for adjuvant whole breast radiation Oncotype DX score is 17 Patient has finished radiation therapy in December of 2015 Started letrozole from December, 2015    No history exists.    Oncology Flowsheet 07/17/2015 07/18/2015 07/18/2015 07/18/2015 07/19/2015 07/19/2015 07/20/2015  ALPRAZolam (XANAX) PO - - - - - - -  cyanocobalamin ((VITAMIN B-12)) IM - - - - - - -  enoxaparin (LOVENOX) [place] - - - - - - -  letrozole (FEMARA) PO - - - - 2.5 mg   2.5 mg  methylPREDNISolone sodium succinate 125 mg/2 mL (SOLU-MEDROL) IJ - - - - - - -  methylPREDNISolone sodium succinate 40 mg/mL (SOLU-MEDROL) IV - - - - - - -  metoCLOPramide (REGLAN) PO - - - - - - -  ondansetron (ZOFRAN) IV 4 mg 4 mg 4 mg 4 mg 4 mg 4 mg 4 mg  ondansetron (ZOFRAN) PO - - - - - - -  ondansetron (ZOFRAN-ODT) PO - - - - - - -  predniSONE (DELTASONE) PO - - - - 5 mg   5 mg  promethazine (PHENERGAN) IV - - - - - - -  promethazine (PHENERGAN) PO - - - - - - -    INTERVAL HISTORY:  80 year old lady U Diana [name] multiple hospital admission in last few months.  With persistent nausea vomiting ended up with upper endoscopy patient had gastroplexy and stomach has been pulled up in the thorax.  Nausea vomiting was considered secondary to that and patient ended up with PEG tube placement .  Patient also  was found to have severe esophagitis hemoglobin dropped to 7 g requiring blood transfusion Also had 2 kyphoplasty am not sure whether biopsy was done or not.  Patient is going for the third kyphoplasty  Swelling of lower extremity.  Patient was in rehabilitation   REVIEW OF SYSTEMS:   Gen. Status: Declining condition.  Performance status has been declining. GI: Patient had persistent nausea had a PEG tube placement and severe esophagitis surgical pathology has been reviewed Patient received 1 unit of packed cell transfusion Hemoglobin is improved from 9.3 Lungs: No shortness of breath Musculoskeletal system: Multiple vertebral compression fracture Requiring kyphoplasty. Neurological system: No headache no dizziness. Skin: No rash Cardiac: No chest pain or paroxysmal nocturnal dyspnea Swelling of lower extremity All other systems have been reviewed As per HPI. Otherwise, a complete review of systems is negatve.  PAST MEDICAL HISTORY: Past Medical History  Diagnosis Date  . Lupus (systemic lupus erythematosus) (HCC)   . Pulmonary hypertension (HCC)   . Pneumonia   . Unspecified menopausal and postmenopausal disorder   . Primary pulmonary hypertension (HCC)   . Acute glomerulonephritis with other specified pathological lesion in kidney in disease classified elsewhere(580.81)   . Unspecified hypothyroidism   . Shingles (herpes zoster) polyneuropathy March 2013    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  .  Hiatal hernia   . DVT (deep venous thrombosis) (HCC)   . Cancer (HCC)     Breast  . CHF (congestive heart failure) (HCC)   . Renal insufficiency   . Hypertension   . Shingles   . Hyperlipidemia   . Alveolar aeration decreased   . Vitamin B deficiency   . Diaphragmatic hernia   . Esophageal reflux   . Peripheral neuropathy, hereditary/idiopathic   . Sciatica   . Diabetes mellitus without complication (HCC)   . Urinary incontinence without sensory awareness     PAST SURGICAL  HISTORY: Past Surgical History  Procedure Laterality Date  . Kyphoplasty N/A 02/10/2015    Procedure: [id number];  Surgeon: Kennedy Bucker, MD;  Location: [facility] ORS;  Service: Orthopedics;  Laterality: N/A;  . Laparotomy N/A 03/11/2015    Procedure: REDUCTION OF GASTRIC VOLVULUS, SPLEENECTOMY, GASTRIC TUBE PLACEMENT;  Surgeon: Earline Mayotte, MD;  Location: [facility] ORS;  Service: General;  Laterality: N/A;  . Esophagogastroduodenoscopy (egd) with propofol N/A 03/28/2015    Procedure: ESOPHAGOGASTRODUODENOSCOPY (EGD) WITH PROPOFOL;  Surgeon: Earline Mayotte, MD;  Location: [facility] ENDOSCOPY;  Service: Endoscopy;  Laterality: N/A;  . Esophagogastroduodenoscopy (egd) with propofol N/A 06/10/2015    Procedure: ESOPHAGOGASTRODUODENOSCOPY (EGD) WITH PROPOFOL;  Surgeon: Christena Deem, MD;  Location: [facility] ENDOSCOPY;  Service: Endoscopy;  Laterality: N/A;  . Peripheral vascular catheterization N/A 06/12/2015    Procedure: IVC Filter Insertion;  Surgeon: Annice Needy, MD;  Location: [facility] INVASIVE CV LAB;  Service: Cardiovascular;  Laterality: N/A;  . Esophagogastroduodenoscopy N/A 07/19/2015    Procedure: ESOPHAGOGASTRODUODENOSCOPY (EGD);  Surgeon: Wallace Cullens, MD;  Location: [facility] ENDOSCOPY;  Service: Gastroenterology;  Laterality: N/A;    FAMILY HISTORY Family History  Problem Relation Age of Onset  . Colon cancer Mother   . Alcohol abuse Sister   . Breast cancer Sister   . Cancer Brother   . Lung cancer Son    Significant History/PMH:   neuropathy:    shingles:    dvt:    pulmonary HTN:    Hypothyroidism:    Lupus:    foot surgery 5 weeks ago: 2005  Preventive Screening:  Has patient had any of the following test? Colonscopy  Mammography  Pap Smear (1)   Last Colonoscopy: 2000(1)   Last Mammography: 2015(1)   Last Pap Smear: 2014(1)   Smoking History: Smoking History Smoked for 10 yrs.  Quit 10 yrs. ago - 1/2 pack per month(1)  PFSH: Comments: mother had colon cancer.   Sister had breast cancer at the age of 36 and died of breast cancer.  Son has lung cancer.  Social History: negative alcohol, negative tobacco  Additional Past Medical and Surgical History: As mentioned above   ADVANCED DIRECTIVES:  No flowsheet data found.  HEALTH MAINTENANCE: Social History  Substance Use Topics  . Smoking status: Former Smoker    Quit date: 05/25/1991  . Smokeless tobacco: Never Used  . Alcohol Use: Yes     Comment: rare      Allergies  Allergen Reactions  . Amoxicillin Other (See Comments)    Reaction:  Stomach cramps   . Cefuroxime Itching  . Contrast Media [Iodinated Diagnostic Agents] Hives  . Metrizamide   . Morphine And Related     Reaction: face flushed    . Propoxyphene   . Cephalexin Rash  . Sulfonamide Derivatives Rash  . Tramadol Hcl Rash  . Venlafaxine Rash    Current Outpatient Prescriptions  Medication Sig Dispense Refill  .  acetaminophen (TYLENOL) 325 MG tablet Take 650 mg by mouth every 4 (four) hours as needed for mild pain or fever.    . ALPRAZolam (XANAX) 0.25 MG tablet Take 1 tablet (0.25 mg total) by mouth at bedtime as needed for anxiety. 20 tablet 0  . azelastine (ASTELIN) 0.1 % nasal spray Place 1 spray into both nostrils 2 (two) times daily.    . budesonide-formoterol (SYMBICORT) 160-4.5 MCG/ACT inhaler Inhale 2 puffs into the lungs 2 (two) times daily.    . cholecalciferol (VITAMIN D) 400 UNITS TABS tablet Take 400 Units by mouth.    . cyanocobalamin (,VITAMIN B-12,) 1000 MCG/ML injection Inject 1 mL (1,000 mcg total) into the muscle every 30 (thirty) days. (Patient taking differently: Inject 1,000 mcg into the muscle every 30 (thirty) days. Pt uses on the 1st of every month.) 10 mL 1  . Fluticasone-Salmeterol (ADVAIR) 250-50 MCG/DOSE AEPB Inhale 1 puff into the lungs 2 (two) times daily.    . furosemide (LASIX) 20 MG tablet Take 20 mg by mouth.    . hydroxychloroquine (PLAQUENIL) 200 MG tablet Take 200 mg by mouth daily.      Marland Kitchen letrozole (FEMARA) 2.5 MG tablet Take 2.5 mg by mouth daily.    Marland Kitchen levocetirizine (XYZAL) 5 MG tablet Take 5 mg by mouth daily.    Marland Kitchen loperamide (IMODIUM) 2 MG capsule Take 2-4 mg by mouth as needed for diarrhea or loose stools.    . metoCLOPramide (REGLAN) 10 MG tablet Take 10 mg by mouth 4 (four) times daily -  before meals and at bedtime.    . metoprolol succinate (TOPROL-XL) 25 MG 24 hr tablet Take 25 mg by mouth daily.    . mycophenolate (CELLCEPT) 250 MG capsule Take 1,000 mg by mouth 2 (two) times daily.    . Nutritional Supplements (FEEDING SUPPLEMENT, JEVITY 1.5 CAL/FIBER,) LIQD Place 1,000 mLs into feeding tube continuous. 30000 mL 1  . ondansetron (ZOFRAN-ODT) 4 MG disintegrating tablet Take 1 tablet (4 mg total) by mouth every 4 (four) hours as needed for nausea or vomiting. 60 tablet 3  . oxyCODONE-acetaminophen (PERCOCET/ROXICET) 5-325 MG tablet Take 1 tablet by mouth daily as needed for severe pain. Pt is able to be given PRN doses in addition to scheduled doses. 30 tablet 0  . pantoprazole sodium (PROTONIX) 40 mg/20 mL PACK Place 40 mg into feeding tube 2 (two) times daily.    . predniSONE (DELTASONE) 5 MG tablet Take 5 mg by mouth daily. Pt takes in addition to the 1mg  tablet.    . promethazine (PHENERGAN) 25 MG tablet Take 6.25-12.5 mg by mouth every 4 (four) hours as needed for nausea or vomiting.     . sucralfate (CARAFATE) 1 G tablet Take 1 tablet (1 g total) by mouth 4 (four) times daily -  with meals and at bedtime. 120 tablet 0  . SYNTHROID 150 MCG tablet Take 1 tablet (150 mcg total) by mouth daily. 30 tablet 11  . Tadalafil, PAH, (ADCIRCA) 20 MG TABS Take 40 mg by mouth daily.    . Water For Irrigation, Sterile (FREE WATER) SOLN Place 150 mLs into feeding tube 2 (two) times daily. 1500 mL 2   No current facility-administered medications for this visit.    OBJECTIVE:  Filed Vitals:   08/17/15 1603  BP: 105/66  Pulse: 85  Temp: 97.3 F (36.3 C)     Body mass  index is 23.24 kg/(m^2).    ECOG FS:2 - Symptomatic, <50% confined to bed  PHYSICAL EXAM: Gen. Status: Patient is in wheelchair.  HEENT: Pale looking sclera and conjunctiva Lymphatic system: Supraclavicular, cervical, axillary, inguinal lymph nodes are not palpable Examination of both breasts: No evidence of palpable masses. Or palpable lymphadenopathy Lower extremity 3+ swelling Cardiac: Tachycardia.  Soft systolic murmur Abdomen: Soft.   liver and spleen not palpable. Patient has a PEG tube placed.  Site is within normal limit Skin: No rash  Neurologically, the patient was awake, alert, and oriented to person, place and time. There were no obvious focal neurologic abnormalities.. Head exam was generally normal. There was no scleral icterus or corneal arcus. Mucous membranes were moist.. Musculoskeletal system tenderness in mid back area Kyphosis  LAB RESULTS:  CBC Latest Ref Rng 08/17/2015 07/27/2015  WBC 3.6 - 11.0 K/uL 6.1 8.0  Hemoglobin 12.0 - 16.0 g/dL 2.5(D) 6.6(Y)  Hematocrit 35.0 - 47.0 % 29.4(L) 30.8(L)  Platelets 150 - 440 K/uL 319 368.0    Appointment on 08/17/2015  Component Date Value Ref Range Status  . WBC 08/17/2015 6.1  3.6 - 11.0 K/uL Final  . RBC 08/17/2015 3.39* 3.80 - 5.20 MIL/uL Final  . Hemoglobin 08/17/2015 9.3* 12.0 - 16.0 g/dL Final  . HCT 40/34/7425 29.4* 35.0 - 47.0 % Final  . MCV 08/17/2015 86.6  80.0 - 100.0 fL Final  . MCH 08/17/2015 27.3  26.0 - 34.0 pg Final  . MCHC 08/17/2015 31.6* 32.0 - 36.0 g/dL Final  . RDW 95/63/8756 17.4* 11.5 - 14.5 % Final  . Platelets 08/17/2015 319  150 - 440 K/uL Final  . Neutrophils Relative % 08/17/2015 73   Final  . Neutro Abs 08/17/2015 4.4  1.4 - 6.5 K/uL Final  . Lymphocytes Relative 08/17/2015 15   Final  . Lymphs Abs 08/17/2015 0.9* 1.0 - 3.6 K/uL Final  . Monocytes Relative 08/17/2015 10   Final  . Monocytes Absolute 08/17/2015 0.6  0.2 - 0.9 K/uL Final  . Eosinophils Relative 08/17/2015 1   Final  .  Eosinophils Absolute 08/17/2015 0.1  0 - 0.7 K/uL Final  . Basophils Relative 08/17/2015 1   Final  . Basophils Absolute 08/17/2015 0.1  0 - 0.1 K/uL Final  . Sodium 08/17/2015 137  135 - 145 mmol/L Final  . Potassium 08/17/2015 4.1  3.5 - 5.1 mmol/L Final  . Chloride 08/17/2015 98* 101 - 111 mmol/L Final  . CO2 08/17/2015 29  22 - 32 mmol/L Final  . Glucose, Bld 08/17/2015 132* 65 - 99 mg/dL Final  . BUN 43/32/9518 22* 6 - 20 mg/dL Final  . Creatinine, Ser 08/17/2015 1.37* 0.44 - 1.00 mg/dL Final  . Calcium 84/16/6063 8.4* 8.9 - 10.3 mg/dL Final  . Total Protein 08/17/2015 6.1* 6.5 - 8.1 g/dL Final  . Albumin 01/60/1093 2.8* 3.5 - 5.0 g/dL Final  . AST 23/55/7322 18  15 - 41 U/L Final  . ALT 08/17/2015 7* 14 - 54 U/L Final  . Alkaline Phosphatase 08/17/2015 65  38 - 126 U/L Final  . Total Bilirubin 08/17/2015 0.4  0.3 - 1.2 mg/dL Final  . GFR calc non Af Amer 08/17/2015 36* >60 mL/min Final  . GFR calc Af Amer 08/17/2015 42* >60 mL/min Final   Comment: (NOTE) The eGFR has been calculated using the CKD EPI equation. This calculation has not been validated in all clinical situations. eGFR's persistently <60 mL/min signify possible Chronic Kidney Disease.   . Anion gap 08/17/2015 10  5 - 15 Final       STUDIES: No  results found.  ASSESSMENT:  Assessment and Plan: Impression:   stage I ER/PR positive invasive mammary carcinoma the left breast status post wide local excision and sentinel node biopsy with low Oncotype DX scorein 80 year old female.  Patient was advised to take calcium and vitamin D Repeat mammogram has been scheduled along with bone density study Patient   is taking letrozole.  Multiple hospital admissions for persistent nausea vomiting anemia All the records have been reviewed. We may consider possibility of tamoxifen and discontinue letrozole if problem with osteoporosis persist  Patient was advised to get iron studies to see whether intravenous iron can be  useful to improve hemoglobin Patient would be reevaluated after that information is available I would follow up with kyphoplasty and biopsy result   Multiple records CT scan and the ultrasound done during the Hospital has been reviewed independently.  Patient expressed understanding and was in agreement with this plan. She also understands that She can call clinic at any time with any questions, concerns, or complaints.    No matching staging information was found for the patient.  Johney Maine, MD   08/17/2015 4:09 PM

## 2015-08-18 ENCOUNTER — Other Ambulatory Visit: Payer: Self-pay | Admitting: *Deleted

## 2015-08-18 DIAGNOSIS — C50412 Malignant neoplasm of upper-outer quadrant of left female breast: Secondary | ICD-10-CM

## 2015-08-19 ENCOUNTER — Inpatient Hospital Stay: Payer: Medicare Other

## 2015-08-19 DIAGNOSIS — C50412 Malignant neoplasm of upper-outer quadrant of left female breast: Secondary | ICD-10-CM

## 2015-08-19 DIAGNOSIS — C50912 Malignant neoplasm of unspecified site of left female breast: Secondary | ICD-10-CM | POA: Diagnosis not present

## 2015-08-19 DIAGNOSIS — Z23 Encounter for immunization: Secondary | ICD-10-CM | POA: Diagnosis not present

## 2015-08-19 DIAGNOSIS — Z79811 Long term (current) use of aromatase inhibitors: Secondary | ICD-10-CM | POA: Diagnosis not present

## 2015-08-19 DIAGNOSIS — Z170 Estrogen receptor positive status [ER+]: Secondary | ICD-10-CM | POA: Diagnosis not present

## 2015-08-19 DIAGNOSIS — Z923 Personal history of irradiation: Secondary | ICD-10-CM | POA: Diagnosis not present

## 2015-08-19 DIAGNOSIS — Z79899 Other long term (current) drug therapy: Secondary | ICD-10-CM | POA: Diagnosis not present

## 2015-08-19 LAB — IRON AND TIBC
Iron: 18 ug/dL — ABNORMAL LOW (ref 28–170)
SATURATION RATIOS: 8 % — AB (ref 10.4–31.8)
TIBC: 231 ug/dL — ABNORMAL LOW (ref 250–450)
UIBC: 213 ug/dL

## 2015-08-19 LAB — FERRITIN: FERRITIN: 58 ng/mL (ref 11–307)

## 2015-08-24 DIAGNOSIS — M3213 Lung involvement in systemic lupus erythematosus: Secondary | ICD-10-CM | POA: Diagnosis not present

## 2015-08-24 DIAGNOSIS — E119 Type 2 diabetes mellitus without complications: Secondary | ICD-10-CM | POA: Diagnosis not present

## 2015-08-24 DIAGNOSIS — B370 Candidal stomatitis: Secondary | ICD-10-CM | POA: Diagnosis not present

## 2015-08-24 DIAGNOSIS — G609 Hereditary and idiopathic neuropathy, unspecified: Secondary | ICD-10-CM | POA: Diagnosis not present

## 2015-08-24 DIAGNOSIS — K922 Gastrointestinal hemorrhage, unspecified: Secondary | ICD-10-CM | POA: Diagnosis not present

## 2015-08-24 DIAGNOSIS — Z431 Encounter for attention to gastrostomy: Secondary | ICD-10-CM | POA: Diagnosis not present

## 2015-08-31 DIAGNOSIS — M545 Low back pain: Secondary | ICD-10-CM | POA: Diagnosis not present

## 2015-08-31 DIAGNOSIS — Z9889 Other specified postprocedural states: Secondary | ICD-10-CM | POA: Diagnosis not present

## 2015-09-01 ENCOUNTER — Other Ambulatory Visit: Payer: Self-pay | Admitting: Orthopedic Surgery

## 2015-09-01 ENCOUNTER — Inpatient Hospital Stay: Payer: Medicare Other | Attending: Oncology

## 2015-09-01 VITALS — BP 117/73 | HR 85 | Temp 97.3°F | Resp 20

## 2015-09-01 DIAGNOSIS — C50912 Malignant neoplasm of unspecified site of left female breast: Secondary | ICD-10-CM | POA: Diagnosis not present

## 2015-09-01 DIAGNOSIS — Z170 Estrogen receptor positive status [ER+]: Secondary | ICD-10-CM | POA: Insufficient documentation

## 2015-09-01 DIAGNOSIS — D509 Iron deficiency anemia, unspecified: Secondary | ICD-10-CM | POA: Diagnosis not present

## 2015-09-01 DIAGNOSIS — D500 Iron deficiency anemia secondary to blood loss (chronic): Secondary | ICD-10-CM

## 2015-09-01 DIAGNOSIS — Z79899 Other long term (current) drug therapy: Secondary | ICD-10-CM | POA: Diagnosis not present

## 2015-09-01 DIAGNOSIS — M545 Low back pain, unspecified: Secondary | ICD-10-CM

## 2015-09-01 MED ORDER — FERUMOXYTOL INJECTION 510 MG/17 ML
510.0000 mg | Freq: Once | INTRAVENOUS | Status: AC
  Administered 2015-09-01: 510 mg via INTRAVENOUS
  Filled 2015-09-01: qty 17

## 2015-09-01 MED ORDER — SODIUM CHLORIDE 0.9 % IV SOLN
Freq: Once | INTRAVENOUS | Status: AC
  Administered 2015-09-01: 15:00:00 via INTRAVENOUS
  Filled 2015-09-01: qty 1000

## 2015-09-02 ENCOUNTER — Ambulatory Visit
Admission: RE | Admit: 2015-09-02 | Discharge: 2015-09-02 | Disposition: A | Discharge status: Home / Self Care | Payer: Medicare Other | Source: Ambulatory Visit | Attending: Orthopedic Surgery | Admitting: Orthopedic Surgery

## 2015-09-02 DIAGNOSIS — M47816 Spondylosis without myelopathy or radiculopathy, lumbar region: Secondary | ICD-10-CM | POA: Diagnosis not present

## 2015-09-02 DIAGNOSIS — M5126 Other intervertebral disc displacement, lumbar region: Secondary | ICD-10-CM | POA: Insufficient documentation

## 2015-09-02 DIAGNOSIS — M545 Low back pain, unspecified: Secondary | ICD-10-CM

## 2015-09-02 DIAGNOSIS — M4806 Spinal stenosis, lumbar region: Secondary | ICD-10-CM | POA: Diagnosis not present

## 2015-09-08 ENCOUNTER — Inpatient Hospital Stay: Payer: Medicare Other

## 2015-09-08 VITALS — BP 115/72 | HR 85 | Temp 98.6°F | Resp 20

## 2015-09-08 DIAGNOSIS — D509 Iron deficiency anemia, unspecified: Secondary | ICD-10-CM | POA: Diagnosis not present

## 2015-09-08 DIAGNOSIS — Z170 Estrogen receptor positive status [ER+]: Secondary | ICD-10-CM | POA: Diagnosis not present

## 2015-09-08 DIAGNOSIS — C50912 Malignant neoplasm of unspecified site of left female breast: Secondary | ICD-10-CM | POA: Diagnosis not present

## 2015-09-08 DIAGNOSIS — Z79899 Other long term (current) drug therapy: Secondary | ICD-10-CM | POA: Diagnosis not present

## 2015-09-08 DIAGNOSIS — D500 Iron deficiency anemia secondary to blood loss (chronic): Secondary | ICD-10-CM

## 2015-09-08 MED ORDER — SODIUM CHLORIDE 0.9 % IV SOLN
Freq: Once | INTRAVENOUS | Status: AC
  Administered 2015-09-08: 14:00:00 via INTRAVENOUS
  Filled 2015-09-08: qty 1000

## 2015-09-08 MED ORDER — SODIUM CHLORIDE 0.9 % IV SOLN
510.0000 mg | Freq: Once | INTRAVENOUS | Status: AC
  Administered 2015-09-08: 510 mg via INTRAVENOUS
  Filled 2015-09-08: qty 17

## 2015-09-09 DIAGNOSIS — K2210 Ulcer of esophagus without bleeding: Secondary | ICD-10-CM | POA: Diagnosis not present

## 2015-09-09 DIAGNOSIS — K210 Gastro-esophageal reflux disease with esophagitis: Secondary | ICD-10-CM | POA: Diagnosis not present

## 2015-09-10 DIAGNOSIS — D508 Other iron deficiency anemias: Secondary | ICD-10-CM | POA: Diagnosis not present

## 2015-09-10 DIAGNOSIS — Z86718 Personal history of other venous thrombosis and embolism: Secondary | ICD-10-CM | POA: Diagnosis not present

## 2015-09-10 DIAGNOSIS — Z79899 Other long term (current) drug therapy: Secondary | ICD-10-CM | POA: Diagnosis not present

## 2015-09-10 DIAGNOSIS — M329 Systemic lupus erythematosus, unspecified: Secondary | ICD-10-CM | POA: Diagnosis not present

## 2015-09-10 DIAGNOSIS — M3214 Glomerular disease in systemic lupus erythematosus: Secondary | ICD-10-CM | POA: Diagnosis not present

## 2015-09-10 DIAGNOSIS — N3000 Acute cystitis without hematuria: Secondary | ICD-10-CM | POA: Diagnosis not present

## 2015-09-10 DIAGNOSIS — I272 Other secondary pulmonary hypertension: Secondary | ICD-10-CM | POA: Diagnosis not present

## 2015-09-10 DIAGNOSIS — M3500 Sicca syndrome, unspecified: Secondary | ICD-10-CM | POA: Diagnosis not present

## 2015-09-10 DIAGNOSIS — Z87448 Personal history of other diseases of urinary system: Secondary | ICD-10-CM | POA: Diagnosis not present

## 2015-09-16 ENCOUNTER — Encounter: Payer: Self-pay | Admitting: *Deleted

## 2015-09-22 ENCOUNTER — Ambulatory Visit: Payer: Medicare Other

## 2015-09-24 ENCOUNTER — Ambulatory Visit
Admission: RE | Admit: 2015-09-24 | Discharge: 2015-09-24 | Disposition: A | Discharge status: Home / Self Care | Payer: Medicare Other | Source: Ambulatory Visit | Attending: Oncology | Admitting: Oncology

## 2015-09-24 DIAGNOSIS — C50412 Malignant neoplasm of upper-outer quadrant of left female breast: Secondary | ICD-10-CM

## 2015-09-24 DIAGNOSIS — R928 Other abnormal and inconclusive findings on diagnostic imaging of breast: Secondary | ICD-10-CM | POA: Diagnosis not present

## 2015-09-24 HISTORY — DX: Malignant neoplasm of unspecified site of unspecified female breast: C50.919

## 2015-09-29 ENCOUNTER — Encounter: Payer: Self-pay | Admitting: Internal Medicine

## 2015-09-29 ENCOUNTER — Ambulatory Visit (INDEPENDENT_AMBULATORY_CARE_PROVIDER_SITE_OTHER): Payer: Medicare Other | Admitting: Internal Medicine

## 2015-09-29 VITALS — BP 112/78 | HR 88 | Temp 98.2°F | Resp 12 | Ht 63.0 in | Wt 125.5 lb

## 2015-09-29 DIAGNOSIS — D509 Iron deficiency anemia, unspecified: Secondary | ICD-10-CM | POA: Diagnosis not present

## 2015-09-29 DIAGNOSIS — E559 Vitamin D deficiency, unspecified: Secondary | ICD-10-CM | POA: Diagnosis not present

## 2015-09-29 DIAGNOSIS — E46 Unspecified protein-calorie malnutrition: Secondary | ICD-10-CM

## 2015-09-29 DIAGNOSIS — R634 Abnormal weight loss: Secondary | ICD-10-CM

## 2015-09-29 DIAGNOSIS — E119 Type 2 diabetes mellitus without complications: Secondary | ICD-10-CM

## 2015-09-29 DIAGNOSIS — D518 Other vitamin B12 deficiency anemias: Secondary | ICD-10-CM

## 2015-09-29 DIAGNOSIS — J321 Chronic frontal sinusitis: Secondary | ICD-10-CM

## 2015-09-29 LAB — CBC WITH DIFFERENTIAL/PLATELET
BASOS PCT: 0.7 % (ref 0.0–3.0)
Basophils Absolute: 0.1 10*3/uL (ref 0.0–0.1)
EOS PCT: 3.3 % (ref 0.0–5.0)
Eosinophils Absolute: 0.2 10*3/uL (ref 0.0–0.7)
HEMATOCRIT: 35.5 % — AB (ref 36.0–46.0)
HEMOGLOBIN: 10.9 g/dL — AB (ref 12.0–15.0)
LYMPHS PCT: 19.1 % (ref 12.0–46.0)
Lymphs Abs: 1.3 10*3/uL (ref 0.7–4.0)
MCHC: 30.8 g/dL (ref 30.0–36.0)
MCV: 91.9 fl (ref 78.0–100.0)
Monocytes Absolute: 0.8 10*3/uL (ref 0.1–1.0)
Monocytes Relative: 11.2 % (ref 3.0–12.0)
Neutro Abs: 4.6 10*3/uL (ref 1.4–7.7)
Neutrophils Relative %: 65.7 % (ref 43.0–77.0)
Platelets: 317 10*3/uL (ref 150.0–400.0)
RBC: 3.87 Mil/uL (ref 3.87–5.11)
RDW: 22.2 % — AB (ref 11.5–15.5)
WBC: 7 10*3/uL (ref 4.0–10.5)

## 2015-09-29 LAB — COMPREHENSIVE METABOLIC PANEL
ALK PHOS: 92 U/L (ref 39–117)
ALT: 10 U/L (ref 0–35)
AST: 19 U/L (ref 0–37)
Albumin: 3.6 g/dL (ref 3.5–5.2)
BILIRUBIN TOTAL: 0.3 mg/dL (ref 0.2–1.2)
BUN: 23 mg/dL (ref 6–23)
CO2: 29 meq/L (ref 19–32)
Calcium: 9.7 mg/dL (ref 8.4–10.5)
Chloride: 102 mEq/L (ref 96–112)
Creatinine, Ser: 1.22 mg/dL — ABNORMAL HIGH (ref 0.40–1.20)
GFR: 45.22 mL/min — AB (ref >60.00)
GLUCOSE: 119 mg/dL — AB (ref 70–99)
Potassium: 4.3 mEq/L (ref 3.5–5.1)
Sodium: 141 mEq/L (ref 135–145)
TOTAL PROTEIN: 6.9 g/dL (ref 6.0–8.3)

## 2015-09-29 LAB — VITAMIN B12: Vitamin B-12: 390 pg/mL (ref 211–911)

## 2015-09-29 LAB — VITAMIN D 25 HYDROXY (VIT D DEFICIENCY, FRACTURES): VITD: 41.01 ng/mL (ref 30.00–100.00)

## 2015-09-29 LAB — FERRITIN: Ferritin: 332 ng/mL — ABNORMAL HIGH (ref 10.0–291.0)

## 2015-09-29 MED ORDER — METOCLOPRAMIDE HCL 10 MG PO TABS: 10.0000 mg | ORAL_TABLET | Freq: Three times a day (TID) | ORAL | Status: DC

## 2015-09-29 MED ORDER — CYANOCOBALAMIN 1000 MCG/ML IJ SOLN: 1000.0000 ug | INTRAMUSCULAR | Status: DC

## 2015-09-29 MED ORDER — HYDROCOD POLST-CPM POLST ER 10-8 MG/5ML PO SUER: 5.0000 mL | Freq: Every evening | ORAL | Status: DC | PRN

## 2015-09-29 MED ORDER — LEVOFLOXACIN 500 MG PO TABS: 500.0000 mg | ORAL_TABLET | Freq: Every day | ORAL | Status: DC

## 2015-09-29 MED ORDER — OXYCODONE-ACETAMINOPHEN 5-325 MG PO TABS: 1.0000 | ORAL_TABLET | Freq: Every day | ORAL | Status: DC | PRN

## 2015-09-29 NOTE — Progress Notes (Signed)
Subjective:  Patient ID: Diana [name], female    DOB: June 19, 1935  Age: 81 y.o. MRN: [id number]  CC: The primary encounter diagnosis was Anemia, iron deficiency. Diagnoses of ANEMIA, B12 DEFICIENCY, Vitamin D deficiency, Loss of weight, Sinusitis chronic, frontal, Diabetes mellitus without complication (HCC), and Protein-calorie malnutrition (HCC) were also pertinent to this visit.  HPI Diana [name] presents for follow up on multiple issues including DM type 2,  IDA, weight loss secondary to severe esophagitis requiring feeding tube placement.   She feels generally better but reports the recent onset of hot flashes accompanied by night sweats.  Has been taking Femara for history of BRCA   2) She has been having sinus congestion  with drainage, cough and eye pain for the past week.   3) IDA: hgb dropped to a nadir 7. secondary to severe esophagitis. She has been  getting iron infusions at the Cancer center,  last one on  Dec 13.     Weight loss,  28 lbs since July,  5 since November.  Feeling better .  Eating by mouth  And getting additional nutrition from 8 pm qo t 10 am with a continuous infusion every night  through the PEG tube. Avoiding meat unless pureed.  Gets nauseated with breakfast unless she eats out then it'sok   Nauseated by eating cereal     Lab Results  Component Value Date   WBC 7.0 09/29/2015   HGB 10.9* 09/29/2015   HCT 35.5* 09/29/2015   MCV 91.9 09/29/2015   PLT 317.0 09/29/2015     Lab Results  Component Value Date   WBC 7.0 09/29/2015   HGB 10.9* 09/29/2015   HCT 35.5* 09/29/2015   MCV 91.9 09/29/2015   PLT 317.0 09/29/2015     Outpatient Prescriptions Prior to Visit  Medication Sig Dispense Refill  . acetaminophen (TYLENOL) 325 MG tablet Take 650 mg by mouth every 4 (four) hours as needed for mild pain or fever.    . ALPRAZolam (XANAX) 0.25 MG tablet Take 1 tablet (0.25 mg total) by mouth at bedtime as needed for anxiety. 20 tablet 0  .  cholecalciferol (VITAMIN D) 400 UNITS TABS tablet Take 400 Units by mouth.    . furosemide (LASIX) 20 MG tablet Take 20 mg by mouth.    . hydroxychloroquine (PLAQUENIL) 200 MG tablet Take 200 mg by mouth daily.    Marland Kitchen letrozole (FEMARA) 2.5 MG tablet Take 2.5 mg by mouth daily.    Marland Kitchen levocetirizine (XYZAL) 5 MG tablet Take 5 mg by mouth daily.    Marland Kitchen loperamide (IMODIUM) 2 MG capsule Take 2-4 mg by mouth as needed for diarrhea or loose stools.    . metoprolol succinate (TOPROL-XL) 25 MG 24 hr tablet Take 25 mg by mouth daily.    . mycophenolate (CELLCEPT) 250 MG capsule Take 1,000 mg by mouth 2 (two) times daily.    . Nutritional Supplements (FEEDING SUPPLEMENT, JEVITY 1.5 CAL/FIBER,) LIQD Place 1,000 mLs into feeding tube continuous. 30000 mL 1  . ondansetron (ZOFRAN-ODT) 4 MG disintegrating tablet Take 1 tablet (4 mg total) by mouth every 4 (four) hours as needed for nausea or vomiting. 60 tablet 3  . pantoprazole sodium (PROTONIX) 40 mg/20 mL PACK Place 40 mg into feeding tube 2 (two) times daily.    . predniSONE (DELTASONE) 5 MG tablet Take 5 mg by mouth daily. Pt takes in addition to the 1mg  tablet.    . promethazine (PHENERGAN) 25 MG tablet Take  6.25-12.5 mg by mouth every 4 (four) hours as needed for nausea or vomiting.     . sucralfate (CARAFATE) 1 G tablet Take 1 tablet (1 g total) by mouth 4 (four) times daily -  with meals and at bedtime. 120 tablet 0  . SYNTHROID 150 MCG tablet Take 1 tablet (150 mcg total) by mouth daily. 30 tablet 11  . Tadalafil, PAH, (ADCIRCA) 20 MG TABS Take 40 mg by mouth daily.    . Water For Irrigation, Sterile (FREE WATER) SOLN Place 150 mLs into feeding tube 2 (two) times daily. 1500 mL 2  . cyanocobalamin (,VITAMIN B-12,) 1000 MCG/ML injection Inject 1 mL (1,000 mcg total) into the muscle every 30 (thirty) days. (Patient taking differently: Inject 1,000 mcg into the muscle every 30 (thirty) days. Pt uses on the 1st of every month.) 10 mL 1  . metoCLOPramide  (REGLAN) 10 MG tablet Take 10 mg by mouth 4 (four) times daily -  before meals and at bedtime.    Marland Kitchen oxyCODONE-acetaminophen (PERCOCET/ROXICET) 5-325 MG tablet Take 1 tablet by mouth daily as needed for severe pain. Pt is able to be given PRN doses in addition to scheduled doses. 30 tablet 0  . azelastine (ASTELIN) 0.1 % nasal spray Place 1 spray into both nostrils 2 (two) times daily. Reported on 09/29/2015    . budesonide-formoterol (SYMBICORT) 160-4.5 MCG/ACT inhaler Inhale 2 puffs into the lungs 2 (two) times daily. Reported on 09/29/2015    . Fluticasone-Salmeterol (ADVAIR) 250-50 MCG/DOSE AEPB Inhale 1 puff into the lungs 2 (two) times daily. Reported on 09/29/2015     No facility-administered medications prior to visit.    Review of Systems;  Patient denies headache, fevers, malaise, unintentional weight loss, skin rash, eye pain, sinus congestion and sinus pain, sore throat, dysphagia,  hemoptysis , cough, dyspnea, wheezing, chest pain, palpitations, orthopnea, edema, abdominal pain, nausea, melena, diarrhea, constipation, flank pain, dysuria, hematuria, urinary  Frequency, nocturia, numbness, tingling, seizures,  Focal weakness, Loss of consciousness,  Tremor, insomnia, depression, anxiety, and suicidal ideation.      Objective:  BP 112/78 mmHg  Pulse 88  Temp(Src) 98.2 F (36.8 C) (Oral)  Resp 12  Ht 5\' 3"  (1.6 m)  Wt 125 lb 8 oz (56.926 kg)  BMI 22.24 kg/m2  SpO2 94%  BP Readings from Last 3 Encounters:  09/29/15 112/78  09/08/15 115/72  09/01/15 117/73    Wt Readings from Last 3 Encounters:  09/29/15 125 lb 8 oz (56.926 kg)  08/17/15 131 lb 2.8 oz (59.5 kg)  07/27/15 129 lb (58.514 kg)    General appearance: alert, cooperative and appears stated age Ears: normal TM's and external ear canals both ears Throat: lips, mucosa, and tongue normal; teeth and gums normal Neck: no adenopathy, no carotid bruit, supple, symmetrical, trachea midline and thyroid not enlarged,  symmetric, no tenderness/mass/nodules Back: symmetric, no curvature. ROM normal. No CVA tenderness. Lungs: clear to auscultation bilaterally Heart: regular rate and rhythm, S1, S2 normal, no murmur, click, rub or gallop Abdomen: soft, non-tender; bowel sounds normal; no masses,  no organomegaly Pulses: 2+ and symmetric Skin: Skin color, texture, turgor normal. No rashes or lesions Lymph nodes: Cervical, supraclavicular, and axillary nodes normal.  Lab Results  Component Value Date   HGBA1C 5.9 07/03/2015   HGBA1C 7.1* 11/19/2014   HGBA1C 6.9* 04/22/2014    Lab Results  Component Value Date   CREATININE 1.22* 09/29/2015   CREATININE 1.37* 08/17/2015   CREATININE 1.01 07/27/2015  Lab Results  Component Value Date   WBC 7.0 09/29/2015   HGB 10.9* 09/29/2015   HCT 35.5* 09/29/2015   PLT 317.0 09/29/2015   GLUCOSE 119* 09/29/2015   CHOL 164 10/11/2013   TRIG 152.0* 10/11/2013   HDL 69.70 10/11/2013   LDLDIRECT 28.0 07/03/2015   LDLCALC 64 10/11/2013   ALT 10 09/29/2015   AST 19 09/29/2015   NA 141 09/29/2015   K 4.3 09/29/2015   CL 102 09/29/2015   CREATININE 1.22* 09/29/2015   BUN 23 09/29/2015   CO2 29 09/29/2015   TSH 2.916 04/01/2015   INR 1.22 06/10/2015   HGBA1C 5.9 07/03/2015   MICROALBUR <0.7 11/28/2014    Mm Diag Breast Tomo Bilateral  09/24/2015  CLINICAL DATA:  81 year old female presenting for routine postlumpectomy follow-up. The patient has had a left breast lumpectomy in 2015 followed by radiation therapy. EXAM: DIGITAL DIAGNOSTIC BILATERAL MAMMOGRAM WITH 3D TOMOSYNTHESIS AND CAD COMPARISON:  Previous exam(s). ACR Breast Density Category d: The breast tissue is extremely dense, which lowers the sensitivity of mammography. FINDINGS: There is a marked increase in apparent density of the breast bilaterally, due to marked weight loss in the since the prior mammogram. Interval changes in the upper-outer quadrant of the left breast, consistent with  postlumpectomy changes. No suspicious calcifications, masses or areas of distortion are seen in the bilateral breasts. Mammographic images were processed with CAD. IMPRESSION: 1. Interval left breast lumpectomy, without evidence of disease recurrence. 2.  No mammographic evidence of malignancy in the bilateral breasts. 3. Marked weight loss causing increased density of the bilateral breasts. RECOMMENDATION: Diagnostic mammogram is suggested in 1 year. (Code:[id number]-B-01Y) I have discussed the findings and recommendations with the patient. Results were also provided in writing at the conclusion of the visit. If applicable, a reminder letter will be sent to the patient regarding the next appointment. BI-RADS CATEGORY  2: Benign. Electronically Signed   By: Frederico Hamman M.D.   On: 09/24/2015 13:56    Assessment & Plan:   Problem List Items Addressed This Visit    ANEMIA, B12 DEFICIENCY    Managed with monthly IM supplementation  Lab Results  Component Value Date   VITAMINB12 390 09/29/2015         Relevant Medications   cyanocobalamin (,VITAMIN B-12,) 1000 MCG/ML injection   Other Relevant Orders   Folate RBC (Completed)   Vitamin B12 (Completed)   Diabetes mellitus without complication (HCC)      Her DM is diet controlled, follow up labs needed .  Lab Results  Component Value Date   HGBA1C 5.9 07/03/2015   Lab Results  Component Value Date   MICROALBUR <0.7 11/28/2014        Protein-calorie malnutrition (HCC)    Protein supplementation shakes discussed.       Anemia, iron deficiency - Primary    Secondary to recurrent GI bleeds.  Markedly improved with iv iron infusions.   CBC Latest Ref Rng 09/29/2015 08/17/2015 07/27/2015  WBC 4.0 - 10.5 K/uL 7.0 6.1 8.0  Hemoglobin 12.0 - 15.0 g/dL 10.9(L) 9.3(L) 9.7(L)  Hematocrit 36.0 - 46.0 % 35.5(L) 29.4(L) 30.8(L)  Platelets 150.0 - 400.0 K/uL 317.0 319 368.0           Relevant Medications   cyanocobalamin (,VITAMIN B-12,)  1000 MCG/ML injection   Other Relevant Orders   Ferritin (Completed)   Iron and TIBC (Completed)   CBC with Differential/Platelet (Completed)   Sinusitis chronic, frontal    She reports recurrent  sinus congestion with drainage ,  No signs of bacterial infection, and she completed a course of levaquin 2 months ago .  Treat conservatively.,  Start abx if no imrovement in one week      Relevant Medications   levofloxacin (LEVAQUIN) 500 MG tablet   chlorpheniramine-HYDROcodone (TUSSIONEX PENNKINETIC ER) 10-8 MG/5ML SUER    Other Visit Diagnoses    Vitamin D deficiency        Relevant Orders    VITAMIN D 25 Hydroxy (Vit-D Deficiency, Fractures) (Completed)    Loss of weight        Relevant Orders    Comprehensive metabolic panel (Completed)       I have changed Ms. Mcglocklin's metoCLOPramide and cyanocobalamin. I am also having her start on levofloxacin and chlorpheniramine-HYDROcodone. Additionally, I am having her maintain her SYNTHROID, hydroxychloroquine, letrozole, Tadalafil (PAH), acetaminophen, Fluticasone-Salmeterol, loperamide, azelastine, levocetirizine, mycophenolate, promethazine, predniSONE, pantoprazole sodium, feeding supplement (JEVITY 1.5 CAL/FIBER), ALPRAZolam, free water, ondansetron, sucralfate, cholecalciferol, furosemide, metoprolol succinate, budesonide-formoterol, and oxyCODONE-acetaminophen.  Meds ordered this encounter  Medications  . levofloxacin (LEVAQUIN) 500 MG tablet    Sig: Take 1 tablet (500 mg total) by mouth daily.    Dispense:  7 tablet    Refill:  0  . chlorpheniramine-HYDROcodone (TUSSIONEX PENNKINETIC ER) 10-8 MG/5ML SUER    Sig: Take 5 mLs by mouth at bedtime as needed for cough.    Dispense:  140 mL    Refill:  0  . oxyCODONE-acetaminophen (PERCOCET/ROXICET) 5-325 MG tablet    Sig: Take 1 tablet by mouth daily as needed for severe pain. Pt is able to be given PRN doses in addition to scheduled doses.    Dispense:  30 tablet    Refill:  0  .  metoCLOPramide (REGLAN) 10 MG tablet    Sig: Take 1 tablet (10 mg total) by mouth 4 (four) times daily -  before meals and at bedtime.    Dispense:  120 tablet    Refill:  5  . cyanocobalamin (,VITAMIN B-12,) 1000 MCG/ML injection    Sig: Inject 1 mL (1,000 mcg total) into the muscle every 30 (thirty) days. Pt uses on the 1st of every month.    Dispense:  10 mL    Refill:  1   A total of 25 minutes of face to face time was spent with patient more than half of which was spent in counselling about the above mentioned conditions  and coordination of care  Medications Discontinued During This Encounter  Medication Reason  . oxyCODONE-acetaminophen (PERCOCET/ROXICET) 5-325 MG tablet Reorder  . metoCLOPramide (REGLAN) 10 MG tablet Reorder  . cyanocobalamin (,VITAMIN B-12,) 1000 MCG/ML injection Reorder    Follow-up: No Follow-up on file.   Sherlene Shams, MD

## 2015-09-29 NOTE — Patient Instructions (Addendum)
1) For your dietary issues:  Try the Premier Protein chocolate shake 160 cal 30 g protein . Great tasting!  try preparing the Hormel precooked bacon,  Just microwave for the aroma   Try the almond/coconut milk  blends    For cereal instead of cow's milk  Orgain almond milk has been fortified with Pea protein  So it has more protein  (10 mg per glass)  Lots of calcium,  no whey. ([employer] in the cereal aisle )  2) You have a viral syndrome which is causing bronchitis.    The post nasal drip may also be contributing to  your  Cough.  I am prescribing Tussionex cough syrup for the cough   I also advise use of the following OTC meds to help with your other symptoms.   Take generic OTC benadryl 25 mg  At bedtime for the drainage,,  Delsym for daytime cough. flush your sinuses twice daily with Simply Saline   If the coughing has not impoved in  48 hours  OR  if you develop T > 100.4,  Green nasal discharge,  Or facial pain, start the antibiotic.  Please take a probiotic ( Align, Floraque or Culturelle) while you are on the antibiotic to prevent a serious antibiotic associated diarrhea  Called clostridium dificile colitis and a vaginal yeast infection

## 2015-09-29 NOTE — Progress Notes (Signed)
Pre-visit discussion using our clinic review tool. No additional management support is needed unless otherwise documented below in the visit note.  

## 2015-09-30 ENCOUNTER — Encounter: Payer: Self-pay | Admitting: *Deleted

## 2015-09-30 ENCOUNTER — Ambulatory Visit: Payer: Medicare Other | Admitting: Family Medicine

## 2015-09-30 LAB — IRON AND TIBC
%SAT: 23 % (ref 11–50)
Iron: 47 ug/dL (ref 45–160)
TIBC: 206 ug/dL — ABNORMAL LOW (ref 250–450)
UIBC: 159 ug/dL (ref 125–400)

## 2015-09-30 LAB — FOLATE RBC: RBC FOLATE: 475 ng/mL (ref >280)

## 2015-09-30 NOTE — Assessment & Plan Note (Signed)
Protein supplementation shakes discussed.

## 2015-09-30 NOTE — Assessment & Plan Note (Addendum)
She reports recurrent sinus congestion with drainage ,  No signs of bacterial infection, and she completed a course of levaquin 2 months ago .  Treat conservatively.,  Start abx if no imrovement in one week

## 2015-09-30 NOTE — Assessment & Plan Note (Signed)
Secondary to recurrent GI bleeds.  Markedly improved with iv iron infusions.   CBC Latest Ref Rng 09/29/2015 08/17/2015 07/27/2015  WBC 4.0 - 10.5 K/uL 7.0 6.1 8.0  Hemoglobin 12.0 - 15.0 g/dL 10.9(L) 9.3(L) 9.7(L)  Hematocrit 36.0 - 46.0 % 35.5(L) 29.4(L) 30.8(L)  Platelets 150.0 - 400.0 K/uL 317.0 319 368.0

## 2015-09-30 NOTE — Assessment & Plan Note (Signed)
Managed with monthly IM supplementation  Lab Results  Component Value Date   VITAMINB12 390 09/29/2015

## 2015-09-30 NOTE — Assessment & Plan Note (Signed)
Her DM is diet controlled, follow up labs needed .  Lab Results  Component Value Date   HGBA1C 5.9 07/03/2015   Lab Results  Component Value Date   MICROALBUR <0.7 11/28/2014

## 2015-10-12 DIAGNOSIS — M329 Systemic lupus erythematosus, unspecified: Secondary | ICD-10-CM | POA: Diagnosis not present

## 2015-10-12 DIAGNOSIS — K224 Dyskinesia of esophagus: Secondary | ICD-10-CM | POA: Diagnosis not present

## 2015-10-12 DIAGNOSIS — K210 Gastro-esophageal reflux disease with esophagitis: Secondary | ICD-10-CM | POA: Diagnosis not present

## 2015-10-19 ENCOUNTER — Encounter: Payer: Self-pay | Admitting: *Deleted

## 2015-10-20 ENCOUNTER — Encounter
Admission: RE | Disposition: A | Discharge status: Home / Self Care | Payer: Self-pay | Source: Ambulatory Visit | Attending: Gastroenterology

## 2015-10-20 ENCOUNTER — Ambulatory Visit: Payer: Medicare Other | Admitting: Anesthesiology

## 2015-10-20 ENCOUNTER — Encounter: Payer: Self-pay | Admitting: *Deleted

## 2015-10-20 ENCOUNTER — Ambulatory Visit
Admission: RE | Admit: 2015-10-20 | Discharge: 2015-10-20 | Disposition: A | Discharge status: Home / Self Care | Payer: Medicare Other | Source: Ambulatory Visit | Attending: Gastroenterology | Admitting: Gastroenterology

## 2015-10-20 DIAGNOSIS — Z87891 Personal history of nicotine dependence: Secondary | ICD-10-CM | POA: Insufficient documentation

## 2015-10-20 DIAGNOSIS — Z853 Personal history of malignant neoplasm of breast: Secondary | ICD-10-CM | POA: Diagnosis not present

## 2015-10-20 DIAGNOSIS — K3189 Other diseases of stomach and duodenum: Secondary | ICD-10-CM | POA: Diagnosis not present

## 2015-10-20 DIAGNOSIS — E039 Hypothyroidism, unspecified: Secondary | ICD-10-CM | POA: Insufficient documentation

## 2015-10-20 DIAGNOSIS — Z880 Allergy status to penicillin: Secondary | ICD-10-CM | POA: Insufficient documentation

## 2015-10-20 DIAGNOSIS — F419 Anxiety disorder, unspecified: Secondary | ICD-10-CM | POA: Insufficient documentation

## 2015-10-20 DIAGNOSIS — K449 Diaphragmatic hernia without obstruction or gangrene: Secondary | ICD-10-CM | POA: Diagnosis not present

## 2015-10-20 DIAGNOSIS — Z888 Allergy status to other drugs, medicaments and biological substances status: Secondary | ICD-10-CM | POA: Insufficient documentation

## 2015-10-20 DIAGNOSIS — Z882 Allergy status to sulfonamides status: Secondary | ICD-10-CM | POA: Diagnosis not present

## 2015-10-20 DIAGNOSIS — R1310 Dysphagia, unspecified: Secondary | ICD-10-CM | POA: Insufficient documentation

## 2015-10-20 DIAGNOSIS — E785 Hyperlipidemia, unspecified: Secondary | ICD-10-CM | POA: Insufficient documentation

## 2015-10-20 DIAGNOSIS — M329 Systemic lupus erythematosus, unspecified: Secondary | ICD-10-CM | POA: Diagnosis not present

## 2015-10-20 DIAGNOSIS — I509 Heart failure, unspecified: Secondary | ICD-10-CM | POA: Diagnosis not present

## 2015-10-20 DIAGNOSIS — K2210 Ulcer of esophagus without bleeding: Secondary | ICD-10-CM | POA: Insufficient documentation

## 2015-10-20 DIAGNOSIS — R1909 Other intra-abdominal and pelvic swelling, mass and lump: Secondary | ICD-10-CM | POA: Diagnosis not present

## 2015-10-20 DIAGNOSIS — I10 Essential (primary) hypertension: Secondary | ICD-10-CM | POA: Insufficient documentation

## 2015-10-20 DIAGNOSIS — Z86718 Personal history of other venous thrombosis and embolism: Secondary | ICD-10-CM | POA: Insufficient documentation

## 2015-10-20 DIAGNOSIS — Z931 Gastrostomy status: Secondary | ICD-10-CM | POA: Diagnosis not present

## 2015-10-20 DIAGNOSIS — K2960 Other gastritis without bleeding: Secondary | ICD-10-CM | POA: Diagnosis not present

## 2015-10-20 DIAGNOSIS — E119 Type 2 diabetes mellitus without complications: Secondary | ICD-10-CM | POA: Diagnosis not present

## 2015-10-20 DIAGNOSIS — K319 Disease of stomach and duodenum, unspecified: Secondary | ICD-10-CM | POA: Diagnosis not present

## 2015-10-20 DIAGNOSIS — K229 Disease of esophagus, unspecified: Secondary | ICD-10-CM | POA: Diagnosis not present

## 2015-10-20 DIAGNOSIS — Z885 Allergy status to narcotic agent status: Secondary | ICD-10-CM | POA: Diagnosis not present

## 2015-10-20 DIAGNOSIS — K210 Gastro-esophageal reflux disease with esophagitis: Secondary | ICD-10-CM | POA: Diagnosis not present

## 2015-10-20 DIAGNOSIS — Z79899 Other long term (current) drug therapy: Secondary | ICD-10-CM | POA: Insufficient documentation

## 2015-10-20 DIAGNOSIS — D649 Anemia, unspecified: Secondary | ICD-10-CM | POA: Diagnosis not present

## 2015-10-20 DIAGNOSIS — K208 Other esophagitis: Secondary | ICD-10-CM | POA: Diagnosis not present

## 2015-10-20 HISTORY — PX: ESOPHAGOGASTRODUODENOSCOPY (EGD) WITH PROPOFOL: SHX5813

## 2015-10-20 LAB — CBC WITH DIFFERENTIAL/PLATELET
Basophils Absolute: 0.1 10*3/uL (ref 0–0.1)
Basophils Relative: 1 %
EOS ABS: 0.2 10*3/uL (ref 0–0.7)
Eosinophils Relative: 3 %
HCT: 30.6 % — ABNORMAL LOW (ref 35.0–47.0)
HEMOGLOBIN: 9.9 g/dL — AB (ref 12.0–16.0)
LYMPHS ABS: 2.2 10*3/uL (ref 1.0–3.6)
Lymphocytes Relative: 28 %
MCH: 29.3 pg (ref 26.0–34.0)
MCHC: 32.3 g/dL (ref 32.0–36.0)
MCV: 90.6 fL (ref 80.0–100.0)
MONO ABS: 1.1 10*3/uL — AB (ref 0.2–0.9)
MONOS PCT: 14 %
NEUTROS ABS: 4.3 10*3/uL (ref 1.4–6.5)
NEUTROS PCT: 54 %
Platelets: 299 10*3/uL (ref 150–440)
RBC: 3.38 MIL/uL — AB (ref 3.80–5.20)
RDW: 20.9 % — ABNORMAL HIGH (ref 11.5–14.5)
WBC: 8 10*3/uL (ref 3.6–11.0)

## 2015-10-20 SURGERY — ESOPHAGOGASTRODUODENOSCOPY (EGD) WITH PROPOFOL
Anesthesia: General

## 2015-10-20 MED ORDER — SODIUM CHLORIDE 0.9 % IV SOLN: INTRAVENOUS | Status: DC

## 2015-10-20 MED ORDER — EPHEDRINE SULFATE 50 MG/ML IJ SOLN
INTRAMUSCULAR | Status: DC | PRN
  Administered 2015-10-20 (×3): 10 mg via INTRAVENOUS

## 2015-10-20 MED ORDER — PHENYLEPHRINE HCL 10 MG/ML IJ SOLN
INTRAMUSCULAR | Status: DC | PRN
  Administered 2015-10-20 (×2): 100 ug via INTRAVENOUS

## 2015-10-20 MED ORDER — SODIUM CHLORIDE 0.9 % IV SOLN
INTRAVENOUS | Status: DC
  Administered 2015-10-20: 1000 mL via INTRAVENOUS

## 2015-10-20 MED ORDER — PROPOFOL 500 MG/50ML IV EMUL
INTRAVENOUS | Status: DC | PRN
  Administered 2015-10-20: 140 ug/kg/min via INTRAVENOUS

## 2015-10-20 MED ORDER — LIDOCAINE HCL (CARDIAC) 20 MG/ML IV SOLN
INTRAVENOUS | Status: DC | PRN
  Administered 2015-10-20: 60 mg via INTRAVENOUS

## 2015-10-20 NOTE — H&P (Signed)
Outpatient short stay form Pre-procedure 10/20/2015 2:41 PM Lollie Sails MD  Primary Physician: Dr Deborra Medina  Reason for visit:  EGD   History of present illness:   Patient is a 81 year old female presenting today for follow-up on her history of severe erosive esophagitis. She has a previous history of gastric volvulus necessitating urgently. His had severe problems with erosive esophagitis. This past October she had an EGD done by Dr. and a dilatation at that time. She was off of occasions for treatment of this when I saw her in the office on 12/21. At that time placed or back on a twice a day proton pump inhibitors while some Carafate. He states she has minimal reflux. She occasionally gets some discomfort in the upper retrosternal area. He is not regurgitating foods. He is still dependent for most of her caloric intake on the PEG that she has however does eat some foods by mouth. She takes no aspirin products or blood thinners.     Current facility-administered medications:  .  0.9 %  sodium chloride infusion, , Intravenous, Continuous, Lollie Sails, MD, Last Rate: 20 mL/hr at 10/20/15 1330, 1,000 mL at 10/20/15 1330 .  0.9 %  sodium chloride infusion, , Intravenous, Continuous, Lollie Sails, MD  Prescriptions prior to admission  Medication Sig Dispense Refill Last Dose  . acetaminophen (TYLENOL) 325 MG tablet Take 650 mg by mouth every 4 (four) hours as needed for mild pain or fever.   Past Week at Unknown time  . ALPRAZolam (XANAX) 0.25 MG tablet Take 1 tablet (0.25 mg total) by mouth at bedtime as needed for anxiety. 20 tablet 0 10/19/2015 at Unknown time  . azelastine (ASTELIN) 0.1 % nasal spray Place 1 spray into both nostrils 2 (two) times daily. Reported on 09/29/2015   Past Week at Unknown time  . budesonide-formoterol (SYMBICORT) 160-4.5 MCG/ACT inhaler Inhale 2 puffs into the lungs 2 (two) times daily. Reported on 09/29/2015   Past Week at Unknown time  .  chlorpheniramine-HYDROcodone (TUSSIONEX PENNKINETIC ER) 10-8 MG/5ML SUER Take 5 mLs by mouth at bedtime as needed for cough. 140 mL 0 Past Week at Unknown time  . cholecalciferol (VITAMIN D) 400 UNITS TABS tablet Take 400 Units by mouth.   10/19/2015 at Unknown time  . cyanocobalamin (,VITAMIN B-12,) 1000 MCG/ML injection Inject 1 mL (1,000 mcg total) into the muscle every 30 (thirty) days. Pt uses on the 1st of every month. 10 mL 1 10/19/2015 at Unknown time  . Fluticasone-Salmeterol (ADVAIR) 250-50 MCG/DOSE AEPB Inhale 1 puff into the lungs 2 (two) times daily. Reported on 09/29/2015   Past Week at Unknown time  . furosemide (LASIX) 20 MG tablet Take 20 mg by mouth.   Past Week at Unknown time  . hydroxychloroquine (PLAQUENIL) 200 MG tablet Take 200 mg by mouth daily.   Past Week at Unknown time  . letrozole (FEMARA) 2.5 MG tablet Take 2.5 mg by mouth daily.   10/19/2015 at Unknown time  . levocetirizine (XYZAL) 5 MG tablet Take 5 mg by mouth daily.   Past Week at Unknown time  . levofloxacin (LEVAQUIN) 500 MG tablet Take 1 tablet (500 mg total) by mouth daily. (Patient not taking: Reported on 10/20/2015) 7 tablet 0 Completed Course at Unknown time  . loperamide (IMODIUM) 2 MG capsule Take 2-4 mg by mouth as needed for diarrhea or loose stools.   Taking  . metoCLOPramide (REGLAN) 10 MG tablet Take 1 tablet (10 mg total) by mouth  4 (four) times daily -  before meals and at bedtime. 120 tablet 5   . metoprolol succinate (TOPROL-XL) 25 MG 24 hr tablet Take 25 mg by mouth daily.   Taking  . mycophenolate (CELLCEPT) 250 MG capsule Take 1,000 mg by mouth 2 (two) times daily.   Taking  . Nutritional Supplements (FEEDING SUPPLEMENT, JEVITY 1.5 CAL/FIBER,) LIQD Place 1,000 mLs into feeding tube continuous. 30000 mL 1 Taking  . ondansetron (ZOFRAN-ODT) 4 MG disintegrating tablet Take 1 tablet (4 mg total) by mouth every 4 (four) hours as needed for nausea or vomiting. 60 tablet 3 Taking  . oxyCODONE-acetaminophen  (PERCOCET/ROXICET) 5-325 MG tablet Take 1 tablet by mouth daily as needed for severe pain. Pt is able to be given PRN doses in addition to scheduled doses. 30 tablet 0   . pantoprazole sodium (PROTONIX) 40 mg/20 mL PACK Place 40 mg into feeding tube 2 (two) times daily.   Taking  . predniSONE (DELTASONE) 5 MG tablet Take 5 mg by mouth daily. Pt takes in addition to the 1mg  tablet.   Taking  . promethazine (PHENERGAN) 25 MG tablet Take 6.25-12.5 mg by mouth every 4 (four) hours as needed for nausea or vomiting.    Taking  . sucralfate (CARAFATE) 1 G tablet Take 1 tablet (1 g total) by mouth 4 (four) times daily -  with meals and at bedtime. 120 tablet 0 Taking  . SYNTHROID 150 MCG tablet Take 1 tablet (150 mcg total) by mouth daily. 30 tablet 11 Taking  . Tadalafil, PAH, (ADCIRCA) 20 MG TABS Take 40 mg by mouth daily.   Taking  . Water For Irrigation, Sterile (FREE WATER) SOLN Place 150 mLs into feeding tube 2 (two) times daily. 1500 mL 2 Taking     Allergies  Allergen Reactions  . Amoxicillin Other (See Comments)    Reaction:  Stomach cramps   . Cefuroxime Itching  . Contrast Media [Iodinated Diagnostic Agents] Hives  . Metrizamide   . Morphine And Related     Reaction: face flushed    . Propoxyphene   . Cephalexin Rash  . Sulfa Antibiotics Rash  . Sulfonamide Derivatives Rash  . Tramadol Hcl Rash  . Venlafaxine Rash     Past Medical History  Diagnosis Date  . Lupus (systemic lupus erythematosus) ([place])   . Pulmonary hypertension ([place])   . Pneumonia   . Unspecified menopausal and postmenopausal disorder   . Primary pulmonary hypertension ([place])   . Acute glomerulonephritis with other specified pathological lesion in kidney in disease classified elsewhere(580.81)   . Unspecified hypothyroidism   . Shingles (herpes zoster) polyneuropathy March 2013    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  . Hiatal hernia   . DVT (deep venous thrombosis) ([place])   . Cancer (HCC)     Breast  . CHF  (congestive heart failure) ([place])   . Renal insufficiency   . Hypertension   . Shingles   . Hyperlipidemia   . Alveolar aeration decreased   . Vitamin B deficiency   . Diaphragmatic hernia   . Esophageal reflux   . Peripheral neuropathy, hereditary/idiopathic   . Sciatica   . Diabetes mellitus without complication ([place])   . Urinary incontinence without sensory awareness   . Breast cancer ([place]) 06/03/2014    positive, radiation    Review of systems:      Physical Exam    Heart and lungs: Irregular rate and rhythm without rub or gallop, lungs are bilaterally clear.  HEENT: Normocephalic atraumatic eyes are anicteric    Other:     Pertinant exam for procedure: Soft nontender nondistended bowel sounds positive normoactive.    Planned proceedures: EGD and indicated procedures. I have discussed the risks benefits and complications of procedures to include not limited to bleeding, infection, perforation and the risk of sedation and the patient wishes to proceed.    Lollie Sails, MD Gastroenterology 10/20/2015  2:41 PM

## 2015-10-20 NOTE — Transfer of Care (Signed)
Immediate Anesthesia Transfer of Care Note  Patient: Diana [name]  Procedure(s) Performed: Procedure(s): ESOPHAGOGASTRODUODENOSCOPY (EGD) WITH PROPOFOL (N/A)  Patient Location: PACU and Endoscopy Unit  Anesthesia Type:General  Level of Consciousness: awake, alert  and oriented  Airway & Oxygen Therapy: Patient Spontanous Breathing and Patient connected to face mask oxygen  Post-op Assessment: Report given to RN and Post -op Vital signs reviewed and stable  Post vital signs: Reviewed and stable  Last Vitals: 1532 - 105hr 16resp 98.2 temp 95% sat 86/53 bp Filed Vitals:   10/20/15 1302  BP: 108/63  Pulse: 85  Temp: 37.2 C  Resp: 18    Complications: No apparent anesthesia complications

## 2015-10-20 NOTE — Op Note (Signed)
[facility] Gastroenterology Patient Name: Diana [name] Procedure Date: 10/20/2015 2:43 PM MRN: [id number] Account #: [id number] Date of Birth: 1935/02/09 Admit Type: Outpatient Age: 81 Room: [facility] ENDO ROOM 3 Gender: Female Note Status: Finalized Procedure:         Upper GI endoscopy Indications:       Odynophagia Providers:         Christena Deem, MD Referring MD:      Duncan Dull, MD (Referring MD) Medicines:         Monitored Anesthesia Care Complications:     Bleeding as noted. Procedure:         Pre-Anesthesia Assessment:                    - ASA Grade Assessment: III - A patient with severe                     systemic disease.                    After obtaining informed consent, the endoscope was passed                     under direct vision. Throughout the procedure, the                     patient's blood pressure, pulse, and oxygen saturations                     were monitored continuously. The Endoscope was introduced                     through the mouth, and advanced to the second part of                     duodenum. The upper GI endoscopy was accomplished without                     difficulty. The patient tolerated the procedure. Findings:      LA Grade D (one or more mucosal breaks involving at least 75% of       esophageal circumference) esophagitis. A mass like protuberance is noted       at the GE junction, in gross appearance like a large area of granulation       tissue.      A large hiatus hernia was present.      The stomach itself appeared to have normal mucosa, with the only       abnormal feature being a tortuous lumen and the hiatal hernia.      The examined duodenum was normal.      The duodenal bulb and 2nd part of the duodenum were normal. I was unable       to pass the scope into the 3rd portion.      On return of the scope to the distal esophagus, a moderate amount of       bleeding was noted where echar had sloughed  off from the eroded area.       Biopsies were taken of the mucosa above the eroded area about 25 cm from       the gingival ridge. Biopsy was taken of the mass like area. Areas of       bleeding were observed and noted to have hemostasis. Impression:        -  LA Grade D erosive esophagitis.                    - Large hiatus hernia.                    - Normal examined duodenum.                    - Normal duodenal bulb and 2nd part of the duodenum.                    - No specimens collected. Recommendation:    - Do an upper GI series in 2 weeks. Procedure Code(s): --- Professional ---                    [phone], Esophagogastroduodenoscopy, flexible, transoral;                     diagnostic, including collection of specimen(s) by                     brushing or washing, when performed (separate procedure) Diagnosis Code(s): --- Professional ---                    K20.8, Other esophagitis                    K44.9, Diaphragmatic hernia without obstruction or gangrene                    R13.10, Dysphagia, unspecified CPT copyright 2014 [employer]. All rights reserved. The codes documented in this report are preliminary and upon coder review may  be revised to meet current compliance requirements. Christena Deem, MD 10/20/2015 3:37:12 PM This report has been signed electronically. Number of Addenda: 0 Note Initiated On: 10/20/2015 2:43 PM      [facility] [address]

## 2015-10-20 NOTE — Anesthesia Preprocedure Evaluation (Signed)
Anesthesia Evaluation  Patient identified by MRN, date of birth, ID band Patient awake    Reviewed: Allergy & Precautions, NPO status , Patient's Chart, lab work & pertinent test results  Airway Mallampati: II       Dental no notable dental hx.    Pulmonary shortness of breath and with exertion, former smoker,    Pulmonary exam normal        Cardiovascular hypertension, Pt. on home beta blockers +CHF   Rhythm:Regular Rate:Normal     Neuro/Psych    GI/Hepatic Neg liver ROS, hiatal hernia, GERD  ,  Endo/Other  diabetes  Renal/GU      Musculoskeletal   Abdominal Normal abdominal exam  (+)   Peds  Hematology  (+) anemia ,   Anesthesia Other Findings   Reproductive/Obstetrics                             Anesthesia Physical Anesthesia Plan  ASA: III  Anesthesia Plan: General   Post-op Pain Management:    Induction: Intravenous  Airway Management Planned: Nasal Cannula  Additional Equipment:   Intra-op Plan:   Post-operative Plan:   Informed Consent: I have reviewed the patients History and Physical, chart, labs and discussed the procedure including the risks, benefits and alternatives for the proposed anesthesia with the patient or authorized representative who has indicated his/her understanding and acceptance.     Plan Discussed with: CRNA  Anesthesia Plan Comments:         Anesthesia Quick Evaluation

## 2015-10-21 ENCOUNTER — Encounter: Payer: Self-pay | Admitting: Gastroenterology

## 2015-10-21 NOTE — Anesthesia Postprocedure Evaluation (Signed)
Anesthesia Post Note  Patient: Diana [name]  Procedure(s) Performed: Procedure(s) (LRB): ESOPHAGOGASTRODUODENOSCOPY (EGD) WITH PROPOFOL (N/A)  Patient location during evaluation: Endoscopy Anesthesia Type: General Level of consciousness: awake and alert Pain management: pain level controlled Vital Signs Assessment: post-procedure vital signs reviewed and stable Respiratory status: spontaneous breathing and respiratory function stable Cardiovascular status: stable Anesthetic complications: no    Last Vitals:  Filed Vitals:   10/20/15 1600 10/20/15 1610  BP: 112/72 111/67  Pulse: 92 93  Temp:    Resp: 20 14    Last Pain: There were no vitals filed for this visit.               Merel Santoli K

## 2015-10-22 DIAGNOSIS — D509 Iron deficiency anemia, unspecified: Secondary | ICD-10-CM | POA: Diagnosis not present

## 2015-10-22 LAB — SURGICAL PATHOLOGY

## 2015-10-28 ENCOUNTER — Other Ambulatory Visit: Payer: Self-pay | Admitting: Gastroenterology

## 2015-10-28 DIAGNOSIS — K219 Gastro-esophageal reflux disease without esophagitis: Secondary | ICD-10-CM | POA: Diagnosis not present

## 2015-10-28 DIAGNOSIS — D509 Iron deficiency anemia, unspecified: Secondary | ICD-10-CM | POA: Diagnosis not present

## 2015-10-28 DIAGNOSIS — K3189 Other diseases of stomach and duodenum: Secondary | ICD-10-CM

## 2015-10-28 DIAGNOSIS — K210 Gastro-esophageal reflux disease with esophagitis: Secondary | ICD-10-CM | POA: Diagnosis not present

## 2015-10-28 DIAGNOSIS — K449 Diaphragmatic hernia without obstruction or gangrene: Secondary | ICD-10-CM | POA: Diagnosis not present

## 2015-10-29 ENCOUNTER — Other Ambulatory Visit: Payer: Self-pay | Admitting: Gastroenterology

## 2015-10-29 ENCOUNTER — Ambulatory Visit: Payer: Medicare Other | Attending: Anesthesiology | Admitting: Anesthesiology

## 2015-10-29 ENCOUNTER — Encounter: Payer: Self-pay | Admitting: Anesthesiology

## 2015-10-29 VITALS — BP 112/60 | HR 76 | Temp 98.2°F | Resp 18 | Ht 63.0 in | Wt 123.0 lb

## 2015-10-29 DIAGNOSIS — M5431 Sciatica, right side: Secondary | ICD-10-CM | POA: Diagnosis not present

## 2015-10-29 DIAGNOSIS — M4806 Spinal stenosis, lumbar region: Secondary | ICD-10-CM | POA: Diagnosis not present

## 2015-10-29 DIAGNOSIS — K2100 Gastro-esophageal reflux disease with esophagitis, without bleeding: Secondary | ICD-10-CM

## 2015-10-29 DIAGNOSIS — M549 Dorsalgia, unspecified: Secondary | ICD-10-CM | POA: Insufficient documentation

## 2015-10-29 DIAGNOSIS — K449 Diaphragmatic hernia without obstruction or gangrene: Secondary | ICD-10-CM

## 2015-10-29 DIAGNOSIS — M5136 Other intervertebral disc degeneration, lumbar region: Secondary | ICD-10-CM | POA: Insufficient documentation

## 2015-10-29 DIAGNOSIS — M48061 Spinal stenosis, lumbar region without neurogenic claudication: Secondary | ICD-10-CM

## 2015-10-29 DIAGNOSIS — M5430 Sciatica, unspecified side: Secondary | ICD-10-CM | POA: Insufficient documentation

## 2015-10-29 MED ORDER — IOHEXOL 180 MG/ML  SOLN
INTRAMUSCULAR | Status: AC
  Filled 2015-10-29: qty 20

## 2015-10-29 MED ORDER — ROPIVACAINE HCL 2 MG/ML IJ SOLN
INTRAMUSCULAR | Status: AC
  Administered 2015-10-29: 16:00:00
  Filled 2015-10-29: qty 10

## 2015-10-29 MED ORDER — TRIAMCINOLONE ACETONIDE 40 MG/ML IJ SUSP
INTRAMUSCULAR | Status: AC
  Administered 2015-10-29: 16:00:00
  Filled 2015-10-29: qty 1

## 2015-10-29 MED ORDER — LIDOCAINE HCL (PF) 1 % IJ SOLN
INTRAMUSCULAR | Status: AC
  Administered 2015-10-29: 16:00:00
  Filled 2015-10-29: qty 5

## 2015-10-29 MED ORDER — FENTANYL CITRATE (PF) 100 MCG/2ML IJ SOLN
INTRAMUSCULAR | Status: AC
  Filled 2015-10-29: qty 2

## 2015-10-29 MED ORDER — MIDAZOLAM HCL 5 MG/5ML IJ SOLN
INTRAMUSCULAR | Status: AC
  Filled 2015-10-29: qty 5

## 2015-10-29 MED ORDER — SODIUM CHLORIDE 0.9 % IJ SOLN
INTRAMUSCULAR | Status: AC
  Administered 2015-10-29: 16:00:00
  Filled 2015-10-29: qty 20

## 2015-10-29 NOTE — Progress Notes (Signed)
Safety precautions to be maintained throughout the outpatient stay will include: orient to surroundings, keep bed in low position, maintain call bell within reach at all times, provide assistance with transfer out of bed and ambulation.  

## 2015-10-29 NOTE — Patient Instructions (Signed)
Pain Management Discharge Instructions  General Discharge Instructions :  If you need to reach your doctor call: Monday-Friday 8:00 am - 4:00 pm at [phone] or toll free [phone].  After clinic hours [phone] to have operator reach doctor.  Bring all of your medication bottles to all your appointments in the pain clinic.  To cancel or reschedule your appointment with Pain Management please remember to call 24 hours in advance to avoid a fee.  Refer to the educational materials which you have been given on: General Risks, I had my Procedure. Discharge Instructions, Post Sedation.  Post Procedure Instructions:  The drugs you were given will stay in your system until tomorrow, so for the next 24 hours you should not drive, make any legal decisions or drink any alcoholic beverages.  You may eat anything you prefer, but it is better to start with liquids then soups and crackers, and gradually work up to solid foods.  Please notify your doctor immediately if you have any unusual bleeding, trouble breathing or pain that is not related to your normal pain.  Depending on the type of procedure that was done, some parts of your body may feel week and/or numb.  This usually clears up by tonight or the next day.  Walk with the use of an assistive device or accompanied by an adult for the 24 hours.  You may use ice on the affected area for the first 24 hours.  Put ice in a Ziploc bag and cover with a towel and place against area 15 minutes on 15 minutes off.  You may switch to heat after 24 hours.Epidural Steroid Injection An epidural steroid injection is given to relieve pain in your neck, back, or legs that is caused by the irritation or swelling of a nerve root. This procedure involves injecting a steroid and numbing medicine (anesthetic) into the epidural space. The epidural space is the space between the outer covering of your spinal cord and the bones that form your backbone  (vertebra).  LET YOUR HEALTH CARE PROVIDER KNOW ABOUT:   Any allergies you have.  All medicines you are taking, including vitamins, herbs, eye drops, creams, and over-the-counter medicines such as aspirin.  Previous problems you or members of your family have had with the use of anesthetics.  Any blood disorders or blood clotting disorders you have.  Previous surgeries you have had.  Medical conditions you have. RISKS AND COMPLICATIONS Generally, this is a safe procedure. However, as with any procedure, complications can occur. Possible complications of epidural steroid injection include:  Headache.  Bleeding.  Infection.  Allergic reaction to the medicines.  Damage to your nerves. The response to this procedure depends on the underlying cause of the pain and its duration. People who have long-term (chronic) pain are less likely to benefit from epidural steroids than are those people whose pain comes on strong and suddenly. BEFORE THE PROCEDURE   Ask your health care provider about changing or stopping your regular medicines. You may be advised to stop taking blood-thinning medicines a few days before the procedure.  You may be given medicines to reduce anxiety.  Arrange for someone to take you home after the procedure. PROCEDURE   You will remain awake during the procedure. You may receive medicine to make you relaxed.  You will be asked to lie on your stomach.  The injection site will be cleaned.  The injection site will be numbed with a medicine (local anesthetic).  A needle will be   injected through your skin into the epidural space.  Your health care provider will use an X-ray machine to ensure that the steroid is delivered closest to the affected nerve. You may have minimal discomfort at this time.  Once the needle is in the right position, the local anesthetic and the steroid will be injected into the epidural space.  The needle will then be removed and a  bandage will be applied to the injection site. AFTER THE PROCEDURE   You may be monitored for a short time before you go home.  You may feel weakness or numbness in your arm or leg, which disappears within hours.  You may be allowed to eat, drink, and take your regular medicine.  You may have soreness at the site of the injection.   This information is not intended to replace advice given to you by your health care provider. Make sure you discuss any questions you have with your health care provider.   Document Released: 12/13/2007 Document Revised: 05/08/2013 Document Reviewed: 02/22/2013 [employer] Interactive Patient Education 2016 [employer]  

## 2015-10-30 ENCOUNTER — Telehealth: Payer: Self-pay

## 2015-10-30 NOTE — Progress Notes (Signed)
Subjective:  Patient ID: Diana [name], female    DOB: 30-Dec-1934  Age: 81 y.o. MRN: [id number]  CC: Back Pain  Procedure: Lumbar epidural steroid under fluoroscopic guidance with moderate sedation at L5-S1  HPI Diana [name] presents for reevaluation. She is having significant pain with radiation into the right posterior leg and down into the calf. In the past she's had epidural steroids for this and these have worked well for her. She presents today requesting a repeat injection for this same pain. No change in bowel or bladder function is noted today and otherwise she is doing well with current medication regimen.  Outpatient Prescriptions Prior to Visit  Medication Sig Dispense Refill  . acetaminophen (TYLENOL) 325 MG tablet Take 650 mg by mouth every 4 (four) hours as needed for mild pain or fever.    . ALPRAZolam (XANAX) 0.25 MG tablet Take 1 tablet (0.25 mg total) by mouth at bedtime as needed for anxiety. 20 tablet 0  . cholecalciferol (VITAMIN D) 400 UNITS TABS tablet Take 400 Units by mouth.    . cyanocobalamin (,VITAMIN B-12,) 1000 MCG/ML injection Inject 1 mL (1,000 mcg total) into the muscle every 30 (thirty) days. Pt uses on the 1st of every month. 10 mL 1  . Fluticasone-Salmeterol (ADVAIR) 250-50 MCG/DOSE AEPB Inhale 1 puff into the lungs 2 (two) times daily. Reported on 09/29/2015    . furosemide (LASIX) 20 MG tablet Take 20 mg by mouth.    . hydroxychloroquine (PLAQUENIL) 200 MG tablet Take 200 mg by mouth daily.    Marland Kitchen letrozole (FEMARA) 2.5 MG tablet Take 2.5 mg by mouth daily.    Marland Kitchen levocetirizine (XYZAL) 5 MG tablet Take 5 mg by mouth daily.    Marland Kitchen loperamide (IMODIUM) 2 MG capsule Take 2-4 mg by mouth as needed for diarrhea or loose stools.    . metoCLOPramide (REGLAN) 10 MG tablet Take 1 tablet (10 mg total) by mouth 4 (four) times daily -  before meals and at bedtime. 120 tablet 5  . metoprolol succinate (TOPROL-XL) 25 MG 24 hr tablet Take 25 mg by mouth daily.    .  mycophenolate (CELLCEPT) 250 MG capsule Take 1,000 mg by mouth 2 (two) times daily.    . Nutritional Supplements (FEEDING SUPPLEMENT, JEVITY 1.5 CAL/FIBER,) LIQD Place 1,000 mLs into feeding tube continuous. 30000 mL 1  . ondansetron (ZOFRAN-ODT) 4 MG disintegrating tablet Take 1 tablet (4 mg total) by mouth every 4 (four) hours as needed for nausea or vomiting. 60 tablet 3  . oxyCODONE-acetaminophen (PERCOCET/ROXICET) 5-325 MG tablet Take 1 tablet by mouth daily as needed for severe pain. Pt is able to be given PRN doses in addition to scheduled doses. 30 tablet 0  . pantoprazole sodium (PROTONIX) 40 mg/20 mL PACK Place 40 mg into feeding tube 2 (two) times daily.    . predniSONE (DELTASONE) 5 MG tablet Take 5 mg by mouth daily. Pt takes in addition to the 1mg  tablet.    . sucralfate (CARAFATE) 1 G tablet Take 1 tablet (1 g total) by mouth 4 (four) times daily -  with meals and at bedtime. 120 tablet 0  . SYNTHROID 150 MCG tablet Take 1 tablet (150 mcg total) by mouth daily. 30 tablet 11  . Tadalafil, PAH, (ADCIRCA) 20 MG TABS Take 40 mg by mouth daily.    . Water For Irrigation, Sterile (FREE WATER) SOLN Place 150 mLs into feeding tube 2 (two) times daily. 1500 mL 2  . azelastine (  ASTELIN) 0.1 % nasal spray Place 1 spray into both nostrils 2 (two) times daily. Reported on 10/29/2015    . budesonide-formoterol (SYMBICORT) 160-4.5 MCG/ACT inhaler Inhale 2 puffs into the lungs 2 (two) times daily. Reported on 10/29/2015    . chlorpheniramine-HYDROcodone (TUSSIONEX PENNKINETIC ER) 10-8 MG/5ML SUER Take 5 mLs by mouth at bedtime as needed for cough. (Patient not taking: Reported on 10/29/2015) 140 mL 0  . levofloxacin (LEVAQUIN) 500 MG tablet Take 1 tablet (500 mg total) by mouth daily. (Patient not taking: Reported on 10/20/2015) 7 tablet 0  . promethazine (PHENERGAN) 25 MG tablet Take 6.25-12.5 mg by mouth every 4 (four) hours as needed for nausea or vomiting. Reported on 10/29/2015     No  facility-administered medications prior to visit.   ROS Review of Systems  Objective:  BP 112/60 mmHg  Pulse 76  Temp(Src) 98.2 F (36.8 C) (Oral)  Resp 18  Ht 5\' 3"  (1.6 m)  Wt 123 lb (55.792 kg)  BMI 21.79 kg/m2  SpO2 99%   BP Readings from Last 3 Encounters:  10/29/15 112/60  10/20/15 111/67  09/29/15 112/78     Wt Readings from Last 3 Encounters:  10/29/15 123 lb (55.792 kg)  09/29/15 125 lb 8 oz (56.926 kg)  08/17/15 131 lb 2.8 oz (59.5 kg)     Physical Exam  PERRL EOMI HEART IS RRR  LCTA MUSCULOSKELETAL feels a positive straight leg raise on the right side negative on the left with some muscular tenderness especially in the right posterior lumbar region. Strength appears to be at baseline.  Labs  Lab Results  Component Value Date   HGBA1C 5.9 07/03/2015   HGBA1C 7.1* 11/19/2014   HGBA1C 6.9* 04/22/2014   Lab Results  Component Value Date   MICROALBUR <0.7 11/28/2014   LDLCALC 64 10/11/2013   CREATININE 1.22* 09/29/2015    -------------------------------------------------------------------------------------------------------------------- Lab Results  Component Value Date   WBC 8.0 10/20/2015   HGB 9.9* 10/20/2015   HCT 30.6* 10/20/2015   PLT 299 10/20/2015   GLUCOSE 119* 09/29/2015   CHOL 164 10/11/2013   TRIG 152.0* 10/11/2013   HDL 69.70 10/11/2013   LDLDIRECT 28.0 07/03/2015   LDLCALC 64 10/11/2013   ALT 10 09/29/2015   AST 19 09/29/2015   NA 141 09/29/2015   K 4.3 09/29/2015   CL 102 09/29/2015   CREATININE 1.22* 09/29/2015   BUN 23 09/29/2015   CO2 29 09/29/2015   TSH 2.916 04/01/2015   INR 1.22 06/10/2015   HGBA1C 5.9 07/03/2015   MICROALBUR <0.7 11/28/2014    --------------------------------------------------------------------------------------------------------------------- Mm Diag Breast Tomo Bilateral  09/24/2015  CLINICAL DATA:  81 year old female presenting for routine postlumpectomy follow-up. The patient has had a  left breast lumpectomy in 2015 followed by radiation therapy. EXAM: DIGITAL DIAGNOSTIC BILATERAL MAMMOGRAM WITH 3D TOMOSYNTHESIS AND CAD COMPARISON:  Previous exam(s). ACR Breast Density Category d: The breast tissue is extremely dense, which lowers the sensitivity of mammography. FINDINGS: There is a marked increase in apparent density of the breast bilaterally, due to marked weight loss in the since the prior mammogram. Interval changes in the upper-outer quadrant of the left breast, consistent with postlumpectomy changes. No suspicious calcifications, masses or areas of distortion are seen in the bilateral breasts. Mammographic images were processed with CAD. IMPRESSION: 1. Interval left breast lumpectomy, without evidence of disease recurrence. 2.  No mammographic evidence of malignancy in the bilateral breasts. 3. Marked weight loss causing increased density of the bilateral breasts. RECOMMENDATION: Diagnostic mammogram  is suggested in 1 year. (Code:DM-B-01Y) I have discussed the findings and recommendations with the patient. Results were also provided in writing at the conclusion of the visit. If applicable, a reminder letter will be sent to the patient regarding the next appointment. BI-RADS CATEGORY  2: Benign. Electronically Signed   By: Frederico Hamman M.D.   On: 09/24/2015 13:56     Assessment & Plan:   Daelyn was seen today for back pain.  Diagnoses and all orders for this visit:  DDD (degenerative disc disease), lumbar  Spinal stenosis of lumbar region  Sciatica associated with disorder of lumbar spine, right  Other orders -     ropivacaine (PF) 2 mg/ml (0.2%) (NAROPIN) 2 MG/ML epidural;  -     lidocaine (PF) (XYLOCAINE) 1 % injection;  -     sodium chloride 0.9 % injection;  -     Discontinue: fentaNYL (SUBLIMAZE) 100 MCG/2ML injection;  -     triamcinolone acetonide (KENALOG-40) 40 MG/ML injection;  -     midazolam (VERSED) 5 MG/5ML injection;  -     Discontinue: iohexol  (OMNIPAQUE) 180 MG/ML injection;         ----------------------------------------------------------------------------------------------------------------------  Problem List Items Addressed This Visit    None    Visit Diagnoses    DDD (degenerative disc disease), lumbar    -  Primary    Relevant Medications    triamcinolone acetonide (KENALOG-40) 40 MG/ML injection (Completed)    Spinal stenosis of lumbar region        Sciatica associated with disorder of lumbar spine, right             ----------------------------------------------------------------------------------------------------------------------  1. DDD (degenerative disc disease), lumbar Plan on an epidural steroid injection today with return to clinic in 1 month for repeat injection  2. Spinal stenosis of lumbar region Epidural injection today  3. Sciatica associated with disorder of lumbar spine, right     ----------------------------------------------------------------------------------------------------------------------  I am having Ms. Usrey maintain her SYNTHROID, hydroxychloroquine, letrozole, Tadalafil (PAH), acetaminophen, Fluticasone-Salmeterol, loperamide, azelastine, levocetirizine, mycophenolate, promethazine, predniSONE, pantoprazole sodium, feeding supplement (JEVITY 1.5 CAL/FIBER), ALPRAZolam, free water, ondansetron, sucralfate, cholecalciferol, furosemide, metoprolol succinate, budesonide-formoterol, levofloxacin, chlorpheniramine-HYDROcodone, oxyCODONE-acetaminophen, metoCLOPramide, and cyanocobalamin. We administered ropivacaine (PF) 2 mg/ml (0.2%), lidocaine (PF), sodium chloride, and triamcinolone acetonide.   Meds ordered this encounter  Medications  . ropivacaine (PF) 2 mg/ml (0.2%) (NAROPIN) 2 MG/ML epidural    Sig:     Jarold Motto, Delores: cabinet override  . lidocaine (PF) (XYLOCAINE) 1 % injection    Sig:     Jarold Motto, Delores: cabinet override  . sodium chloride 0.9 % injection     Sig:     Jarold Motto, Delores: cabinet override  . DISCONTD: fentaNYL (SUBLIMAZE) 100 MCG/2ML injection    Sig:     Jarold Motto, Delores: cabinet override  . triamcinolone acetonide (KENALOG-40) 40 MG/ML injection    Sig:     Jarold Motto, Delores: cabinet override  . midazolam (VERSED) 5 MG/5ML injection    Sig:     Jarold Motto, Delores: cabinet override  . DISCONTD: iohexol (OMNIPAQUE) 180 MG/ML injection    Sig:     Jarold Motto, Delores: cabinet override   Patient's Medications  New Prescriptions   No medications on file  Previous Medications   ACETAMINOPHEN (TYLENOL) 325 MG TABLET    Take 650 mg by mouth every 4 (four) hours as needed for mild pain or fever.   ALPRAZOLAM (XANAX) 0.25 MG TABLET    Take 1 tablet (0.25 mg total)  by mouth at bedtime as needed for anxiety.   AZELASTINE (ASTELIN) 0.1 % NASAL SPRAY    Place 1 spray into both nostrils 2 (two) times daily. Reported on 10/29/2015   BUDESONIDE-FORMOTEROL (SYMBICORT) 160-4.5 MCG/ACT INHALER    Inhale 2 puffs into the lungs 2 (two) times daily. Reported on 10/29/2015   CHLORPHENIRAMINE-HYDROCODONE (TUSSIONEX PENNKINETIC ER) 10-8 MG/5ML SUER    Take 5 mLs by mouth at bedtime as needed for cough.   CHOLECALCIFEROL (VITAMIN D) 400 UNITS TABS TABLET    Take 400 Units by mouth.   CYANOCOBALAMIN (,VITAMIN B-12,) 1000 MCG/ML INJECTION    Inject 1 mL (1,000 mcg total) into the muscle every 30 (thirty) days. Pt uses on the 1st of every month.   FLUTICASONE-SALMETEROL (ADVAIR) 250-50 MCG/DOSE AEPB    Inhale 1 puff into the lungs 2 (two) times daily. Reported on 09/29/2015   FUROSEMIDE (LASIX) 20 MG TABLET    Take 20 mg by mouth.   HYDROXYCHLOROQUINE (PLAQUENIL) 200 MG TABLET    Take 200 mg by mouth daily.   LETROZOLE (FEMARA) 2.5 MG TABLET    Take 2.5 mg by mouth daily.   LEVOCETIRIZINE (XYZAL) 5 MG TABLET    Take 5 mg by mouth daily.   LEVOFLOXACIN (LEVAQUIN) 500 MG TABLET    Take 1 tablet (500 mg total) by mouth daily.   LOPERAMIDE (IMODIUM) 2 MG  CAPSULE    Take 2-4 mg by mouth as needed for diarrhea or loose stools.   METOCLOPRAMIDE (REGLAN) 10 MG TABLET    Take 1 tablet (10 mg total) by mouth 4 (four) times daily -  before meals and at bedtime.   METOPROLOL SUCCINATE (TOPROL-XL) 25 MG 24 HR TABLET    Take 25 mg by mouth daily.   MYCOPHENOLATE (CELLCEPT) 250 MG CAPSULE    Take 1,000 mg by mouth 2 (two) times daily.   NUTRITIONAL SUPPLEMENTS (FEEDING SUPPLEMENT, JEVITY 1.5 CAL/FIBER,) LIQD    Place 1,000 mLs into feeding tube continuous.   ONDANSETRON (ZOFRAN-ODT) 4 MG DISINTEGRATING TABLET    Take 1 tablet (4 mg total) by mouth every 4 (four) hours as needed for nausea or vomiting.   OXYCODONE-ACETAMINOPHEN (PERCOCET/ROXICET) 5-325 MG TABLET    Take 1 tablet by mouth daily as needed for severe pain. Pt is able to be given PRN doses in addition to scheduled doses.   PANTOPRAZOLE SODIUM (PROTONIX) 40 MG/20 ML PACK    Place 40 mg into feeding tube 2 (two) times daily.   PREDNISONE (DELTASONE) 5 MG TABLET    Take 5 mg by mouth daily. Pt takes in addition to the 1mg  tablet.   PROMETHAZINE (PHENERGAN) 25 MG TABLET    Take 6.25-12.5 mg by mouth every 4 (four) hours as needed for nausea or vomiting. Reported on 10/29/2015   SUCRALFATE (CARAFATE) 1 G TABLET    Take 1 tablet (1 g total) by mouth 4 (four) times daily -  with meals and at bedtime.   SYNTHROID 150 MCG TABLET    Take 1 tablet (150 mcg total) by mouth daily.   TADALAFIL, PAH, (ADCIRCA) 20 MG TABS    Take 40 mg by mouth daily.   WATER FOR IRRIGATION, STERILE (FREE WATER) SOLN    Place 150 mLs into feeding tube 2 (two) times daily.  Modified Medications   No medications on file  Discontinued Medications   No medications on file   ----------------------------------------------------------------------------------------------------------------------  Follow-up: Return in about 1 month (around 11/26/2015) for procedure.   Procedure.  Procedure: L5-S1 LESI with fluoroscopic guidance and  moderate sedation  NOTE: The risks, benefits, and expectations of the procedure have been discussed and explained to the patient who was understanding and in agreement with suggested treatment plan. No guarantees were made.  DESCRIPTION OF PROCEDURE: Lumbar epidural steroid injection with IV Versed, EKG, blood pressure, pulse, and pulse oximetry monitoring. The procedure was performed with the patient in the prone position under fluoroscopic guidance. A local anesthetic skin wheal of 1.5% plain lidocaine was performed at the appropriate site after fluoroscopic identifictation  Using strict aseptic technique, 1 mg of Versed was titrated for moderate sedation and I then advanced an 18-gauge Tuohy epidural needle in the midline via loss-of-resistance to saline. There was negative aspiration for heme or  CSF.  I then confirmed position with both AP and Lateral fluoroscan. At L5-S1  A total of 5 mL of Preservative-Free normal saline with 40 mg of Kenalog and 1cc Ropicaine 0.2 percent was injected incrementally via the  epidurally placed needle. Needle removed. The patient tolerated the injection well and was convalesced and discharged to home in stable condition. If the patient has any post procedure difficulty they have been instructed on how to contact us for assistance.   [email or web address]  [email or web address] [email or web address], MD@   Yevette Edwards, MD

## 2015-10-30 NOTE — Telephone Encounter (Signed)
Pt denies any problems or concerns with procedure

## 2015-11-02 ENCOUNTER — Ambulatory Visit
Admission: RE | Admit: 2015-11-02 | Discharge: 2015-11-02 | Disposition: A | Discharge status: Home / Self Care | Payer: Medicare Other | Source: Ambulatory Visit | Attending: Gastroenterology | Admitting: Gastroenterology

## 2015-11-02 DIAGNOSIS — Z8711 Personal history of peptic ulcer disease: Secondary | ICD-10-CM | POA: Insufficient documentation

## 2015-11-02 DIAGNOSIS — R1310 Dysphagia, unspecified: Secondary | ICD-10-CM | POA: Insufficient documentation

## 2015-11-02 DIAGNOSIS — K2100 Gastro-esophageal reflux disease with esophagitis, without bleeding: Secondary | ICD-10-CM

## 2015-11-02 DIAGNOSIS — R112 Nausea with vomiting, unspecified: Secondary | ICD-10-CM | POA: Diagnosis not present

## 2015-11-02 DIAGNOSIS — K449 Diaphragmatic hernia without obstruction or gangrene: Secondary | ICD-10-CM | POA: Diagnosis not present

## 2015-11-02 DIAGNOSIS — K219 Gastro-esophageal reflux disease without esophagitis: Secondary | ICD-10-CM | POA: Insufficient documentation

## 2015-11-07 ENCOUNTER — Other Ambulatory Visit: Payer: Self-pay | Admitting: Internal Medicine

## 2015-11-10 ENCOUNTER — Other Ambulatory Visit: Payer: Self-pay | Admitting: *Deleted

## 2015-11-10 DIAGNOSIS — I5041 Acute combined systolic (congestive) and diastolic (congestive) heart failure: Secondary | ICD-10-CM

## 2015-11-10 NOTE — Patient Outreach (Addendum)
Diana [name]) Care Management  11/10/2015  Diana [name] 06/01/1935 [id number]  Subjective: Telephone call to patient's home number, spoke with patient, and HIPAA verified.   Patient states she is doing okay.    Discussed [facility] Care Management services and patient in agreement to receive services.  Patient gave [facility] verbal authorization to speak with son Diana [name] regarding her healthcare needs as needed.   Patient states she needs assistance with understanding why cellcept prescription will cost her $90 this month and it has not been that expensive in the past.   Patient in agreement with RNCM calling pharmacy to inquire about the cellcept cost. States the prescription will be picked up tomorrow by her son.    Patient states she is taking the cellcept for pulmonary hypertension.  Patient in agreement to [place] referral for medication review.  Patient states she is currently able to afford her medications.  Patient states she does not weigh daily for congestive heart failure because she does not have it right now.   RNCM educated patient on the acute/ chronic signs and symptoms of congestive heart failure,and the importance of disease management.  Patient voices understanding and is in agreement to receive disease education.  Patient in agreement with referral to [place] for care coordination of cellcept, congestive heart failure disease education, disease management, and community resources.   Patient states she does not have any transportation issues.   States she has not driven in months and family will provide transportation as needed.   Patient states she has had home health through [facility] in past and was discharged after she met her care goals.  Patient will continue to receive [facility] Management services.   Patient given [facility] Care Management main contact number, RNCM's contact number, and 24 hour Nurse Advice line number for future reference.    Objective: Per Epic case review: Patient hospitalized 07/17/15 - 07/20/15 for gastrointestinal bleeding, anemia, acute renal failure, and severe protein-calorie malnutrition.    Patient hospitalized 06/08/15 -06/13/15 for acute gastrointestinal bleeding.   Patient has a history of breast cancer, pulmonary hypertension, diabetes, congestive heart failure, anemia B12 deficiency, erosive esophagitis, and systemic lupus erythematosus.    Assessment:  Received [employer] Tier 4 list referral on 10/30/15.   Referral states patient mailed information letter, magnet, and brochure from [facility].   3 Admissions, 2 Ambulance encounters, [place] and 1 skilled nursing facility claim.   Diagnosis: Congestive Heart Failure and COPD.    Patient will be followed for [place] Management services.    Plan:  RNCM will continue to follow patient for care coordination of cellcept, congestive heart failure disease education, disease management, and community resources.   RNCM will refer patient to [place] for medication review.   RNCM will call patient's pharmacy regarding cellcept copay within 3 business days and update patient on status.    Diana [name] H. Annia Friendly, BSN, [place] [facility] Telephonic CM Phone: [phone] Fax: [phone]

## 2015-11-11 ENCOUNTER — Other Ambulatory Visit: Payer: Self-pay | Admitting: *Deleted

## 2015-11-11 DIAGNOSIS — I5033 Acute on chronic diastolic (congestive) heart failure: Principal | ICD-10-CM

## 2015-11-11 DIAGNOSIS — I38 Endocarditis, valve unspecified: Secondary | ICD-10-CM

## 2015-11-11 NOTE — Patient Outreach (Signed)
[facility]) Care Management  11/11/2015  JASPER RUMINSKI [birth year] [id number]  Subjective:  Received verbal authorization from patient on 11/10/15 to contact [place] [phone]) regarding Cellcept copay amount.    Telephone call to [facility], spoke with Lovena Le, and HIPAA verified.   States patient has a 30 day supply of Cellcept (532m tablets times 2 bid ) ready for pick up,  cost this month is $10.56, last month was 87.86, and 3 month supply in October of 2016 was $13.18.   States it looks like the copayment amount for this year may have increased and the fill volume may have decreased from 90 to 30 day supply.    Telephone call to patient's home number, spoke with patient, and HIPAA verified.   [facility].  RNCM advised patient of above conversation with [facility].   Patient states she is all right with the cost of the medication going forward and will get a 90 day supply.   States she only ordered a 30 day supply this month because it was so expensive last month.    Patient states she is still in bed and request call back next week for assessment completion.   Patient states she does not have any further care coordination needs at this time and is in agreement to a referral to [place] and disease management.     Objective: Per Epic case review: Patient hospitalized 07/17/15 - 07/20/15 for gastrointestinal bleeding, anemia, acute renal failure, and severe protein-calorie malnutrition. Patient hospitalized 06/08/15 -06/13/15 for acute gastrointestinal bleeding. Patient has a history of breast cancer, pulmonary hypertension, diabetes, congestive heart failure, anemia B12 deficiency, erosive esophagitis, and systemic lupus erythematosus.   Assessment: Received Next[employer] Tier 4 list referral on 10/30/15. Referral states patient mailed  information letter, magnet, and brochure from [facility]. 3 Admissions, 2 Ambulance encounters, [place]. Diagnosis: Congestive Heart Failure and COPD. Patient will be followed for [facility] services. Patient has no further [facility] RNCM needs at this time.   Plan: RNCM will refer patient to [facility] congest[facility] failure education and disease management.     Carol Theys H. CAnnia Friendly BSN, [facility] CenterTelephonic CM Phone: [phone]: [phone]

## 2015-11-16 ENCOUNTER — Telehealth: Payer: Self-pay | Admitting: *Deleted

## 2015-11-16 NOTE — Telephone Encounter (Signed)
Patient requesting refill on Percocet last refill 09/29/15 ok to fill? Patient has 2 pills left 1 per day.

## 2015-11-16 NOTE — Telephone Encounter (Signed)
Patient requested a call from Juliann Pulse, about a personal issue, that she did not care to share Pt contact [phone]

## 2015-11-17 MED ORDER — OXYCODONE-ACETAMINOPHEN 5-325 MG PO TABS: 1.0000 | ORAL_TABLET | Freq: Every day | ORAL | Status: DC | PRN

## 2015-11-17 NOTE — Telephone Encounter (Signed)
pirnted

## 2015-11-17 NOTE — Telephone Encounter (Signed)
Notified patient script ready for pick up.

## 2015-11-18 ENCOUNTER — Other Ambulatory Visit: Payer: Self-pay | Admitting: *Deleted

## 2015-11-18 ENCOUNTER — Encounter: Payer: Self-pay | Admitting: Anesthesiology

## 2015-11-18 ENCOUNTER — Ambulatory Visit: Payer: Medicare Other | Attending: Anesthesiology | Admitting: Anesthesiology

## 2015-11-18 VITALS — BP 116/67 | HR 84 | Temp 98.3°F | Resp 14 | Ht 63.0 in | Wt 120.0 lb

## 2015-11-18 DIAGNOSIS — M4806 Spinal stenosis, lumbar region: Secondary | ICD-10-CM | POA: Insufficient documentation

## 2015-11-18 DIAGNOSIS — M545 Low back pain: Secondary | ICD-10-CM | POA: Diagnosis not present

## 2015-11-18 DIAGNOSIS — M5136 Other intervertebral disc degeneration, lumbar region: Secondary | ICD-10-CM | POA: Diagnosis not present

## 2015-11-18 DIAGNOSIS — M549 Dorsalgia, unspecified: Secondary | ICD-10-CM | POA: Diagnosis present

## 2015-11-18 DIAGNOSIS — M48061 Spinal stenosis, lumbar region without neurogenic claudication: Secondary | ICD-10-CM

## 2015-11-18 DIAGNOSIS — M5431 Sciatica, right side: Secondary | ICD-10-CM | POA: Diagnosis not present

## 2015-11-18 DIAGNOSIS — K2210 Ulcer of esophagus without bleeding: Secondary | ICD-10-CM | POA: Diagnosis not present

## 2015-11-18 MED ORDER — ROPIVACAINE HCL 2 MG/ML IJ SOLN: 10.0000 mL | Freq: Once | INTRAMUSCULAR | Status: DC

## 2015-11-18 MED ORDER — IOHEXOL 180 MG/ML  SOLN: 20.0000 mL | Freq: Once | INTRAMUSCULAR | Status: DC | PRN

## 2015-11-18 MED ORDER — MIDAZOLAM HCL 5 MG/5ML IJ SOLN: 5.0000 mg | Freq: Once | INTRAMUSCULAR | Status: DC

## 2015-11-18 MED ORDER — IOHEXOL 180 MG/ML  SOLN
INTRAMUSCULAR | Status: AC
  Filled 2015-11-18: qty 20

## 2015-11-18 MED ORDER — TRIAMCINOLONE ACETONIDE 40 MG/ML IJ SUSP
INTRAMUSCULAR | Status: AC
  Administered 2015-11-18: 16:00:00
  Filled 2015-11-18: qty 1

## 2015-11-18 MED ORDER — LIDOCAINE HCL (PF) 1 % IJ SOLN
5.0000 mL | Freq: Once | INTRAMUSCULAR | Status: DC
Start: 2015-11-18 — End: 2016-02-24

## 2015-11-18 MED ORDER — MIDAZOLAM HCL 5 MG/5ML IJ SOLN
INTRAMUSCULAR | Status: AC
  Administered 2015-11-18: 1 mg via INTRAVENOUS
  Filled 2015-11-18: qty 5

## 2015-11-18 MED ORDER — SODIUM CHLORIDE 0.9% FLUSH: 10.0000 mL | Freq: Once | INTRAVENOUS | Status: DC

## 2015-11-18 MED ORDER — ROPIVACAINE HCL 2 MG/ML IJ SOLN
INTRAMUSCULAR | Status: AC
  Administered 2015-11-18: 16:00:00
  Filled 2015-11-18: qty 10

## 2015-11-18 MED ORDER — SODIUM CHLORIDE 0.9 % IJ SOLN
INTRAMUSCULAR | Status: AC
  Administered 2015-11-18: 16:00:00
  Filled 2015-11-18: qty 20

## 2015-11-18 MED ORDER — LIDOCAINE HCL (PF) 1 % IJ SOLN
INTRAMUSCULAR | Status: AC
  Administered 2015-11-18: 16:00:00
  Filled 2015-11-18: qty 5

## 2015-11-18 MED ORDER — TRIAMCINOLONE ACETONIDE 40 MG/ML IJ SUSP: 40.0000 mg | Freq: Once | INTRAMUSCULAR | Status: DC

## 2015-11-18 NOTE — Progress Notes (Signed)
Safety precautions to be maintained throughout the outpatient stay will include: orient to surroundings, keep bed in low position, maintain call bell within reach at all times, provide assistance with transfer out of bed and ambulation.  

## 2015-11-18 NOTE — Patient Outreach (Signed)
[facility]) Care Management  11/18/2015  ZOA FONDREN 18-May-1935 [id number]   RN Health Coach telephone call to patient.  Hipaa compliance verified. RN Health Coach  introduced self to patient. Patient was very agreeable to follow up out reach calls. Patient was referred by Sonda Rumble  Plan: [place] will do follow up call within a month for assessment. Patient agreed to follow up call  Johny Shock, BSN, [place] Management RN Health Coach Phone: [facility] complies with applicable Federal civil rights laws and does not discriminate on the basis of race, color, national origin, age, disability, or sex. Espaol (Spanish)  [facility] cumple con las leyes federales de derechos civiles aplicables y no discrimina por motivos de raza, color, nacionalidad, edad, discapacidad o sexo.    Ti?[place] Vi?t ([place])  [place] tun th? lu?t dn quy?n hi?n hnh c?a Lin bang v khng phn bi?t ?i x? d?a trn ch?ng t?c, mu da, ngu?n g?c qu?c gia, ? tu?i, khuy?t t?t, ho?c gi?i tnh.    (Arabic)    [place]                      .

## 2015-11-18 NOTE — Progress Notes (Signed)
Subjective:  Patient ID: Diana [name], female    DOB: 1935/06/28  Age: 81 y.o. MRN: [id number]  CC: Back Pain  Procedure: Lumbar epidural steroid under fluoroscopic guidance with moderate sedation at L5-S1 #2  HPI Diana [name] presents for reevaluation. She was last seen approximately a month ago. She reports a 50% improvement in her low back pain following her previous epidural steroid injection and feels that she is making good progress overall. Only recently has she had some recurrence of the same pain as previously documented but this has been of less severity. She feels she is making progress with better sleep at night reported. Her medications have been well tolerated as well.  Outpatient Prescriptions Prior to Visit  Medication Sig Dispense Refill  . acetaminophen (TYLENOL) 325 MG tablet Take 650 mg by mouth every 4 (four) hours as needed for mild pain or fever.    . ALPRAZolam (XANAX) 0.25 MG tablet Take 1 tablet (0.25 mg total) by mouth at bedtime as needed for anxiety. 20 tablet 0  . azelastine (ASTELIN) 0.1 % nasal spray Place 1 spray into both nostrils 2 (two) times daily. Reported on 10/29/2015    . budesonide-formoterol (SYMBICORT) 160-4.5 MCG/ACT inhaler Inhale 2 puffs into the lungs 2 (two) times daily. Reported on 10/29/2015    . chlorpheniramine-HYDROcodone (TUSSIONEX PENNKINETIC ER) 10-8 MG/5ML SUER Take 5 mLs by mouth at bedtime as needed for cough. 140 mL 0  . cholecalciferol (VITAMIN D) 400 UNITS TABS tablet Take 400 Units by mouth.    . cyanocobalamin (,VITAMIN B-12,) 1000 MCG/ML injection Inject 1 mL (1,000 mcg total) into the muscle every 30 (thirty) days. Pt uses on the 1st of every month. 10 mL 1  . Fluticasone-Salmeterol (ADVAIR) 250-50 MCG/DOSE AEPB Inhale 1 puff into the lungs 2 (two) times daily. Reported on 09/29/2015    . furosemide (LASIX) 20 MG tablet Take 20 mg by mouth.    . hydroxychloroquine (PLAQUENIL) 200 MG tablet Take 200 mg by mouth daily.    Marland Kitchen  letrozole (FEMARA) 2.5 MG tablet Take 2.5 mg by mouth daily.    Marland Kitchen levocetirizine (XYZAL) 5 MG tablet Take 5 mg by mouth daily.    Marland Kitchen loperamide (IMODIUM) 2 MG capsule Take 2-4 mg by mouth as needed for diarrhea or loose stools.    . metoCLOPramide (REGLAN) 10 MG tablet Take 1 tablet (10 mg total) by mouth 4 (four) times daily -  before meals and at bedtime. 120 tablet 5  . metoprolol succinate (TOPROL-XL) 25 MG 24 hr tablet Take 25 mg by mouth daily.    . mycophenolate (CELLCEPT) 250 MG capsule Take 1,000 mg by mouth 2 (two) times daily.    . Nutritional Supplements (FEEDING SUPPLEMENT, JEVITY 1.5 CAL/FIBER,) LIQD Place 1,000 mLs into feeding tube continuous. 30000 mL 1  . ondansetron (ZOFRAN-ODT) 4 MG disintegrating tablet Take 1 tablet (4 mg total) by mouth every 4 (four) hours as needed for nausea or vomiting. 60 tablet 3  . oxyCODONE-acetaminophen (PERCOCET/ROXICET) 5-325 MG tablet Take 1 tablet by mouth daily as needed for severe pain. 30 tablet 0  . pantoprazole sodium (PROTONIX) 40 mg/20 mL PACK Place 40 mg into feeding tube 2 (two) times daily.    . predniSONE (DELTASONE) 5 MG tablet Take 5 mg by mouth daily. Pt takes in addition to the 1mg  tablet.    . promethazine (PHENERGAN) 25 MG tablet Take 6.25-12.5 mg by mouth every 4 (four) hours as needed for nausea or vomiting.  Reported on 10/29/2015    . ranitidine (ZANTAC) 150 MG tablet TAKE 1 TABLET (150 MG TOTAL) BY MOUTH AT BEDTIME. 90 tablet 2  . sucralfate (CARAFATE) 1 G tablet Take 1 tablet (1 g total) by mouth 4 (four) times daily -  with meals and at bedtime. 120 tablet 0  . SYNTHROID 150 MCG tablet Take 1 tablet (150 mcg total) by mouth daily. 30 tablet 11  . Tadalafil, PAH, (ADCIRCA) 20 MG TABS Take 40 mg by mouth daily.    . Water For Irrigation, Sterile (FREE WATER) SOLN Place 150 mLs into feeding tube 2 (two) times daily. 1500 mL 2  . levofloxacin (LEVAQUIN) 500 MG tablet Take 1 tablet (500 mg total) by mouth daily. (Patient not taking:  Reported on 10/20/2015) 7 tablet 0   No facility-administered medications prior to visit.   ROS Review of Systems  Objective:  BP 118/79 mmHg  Pulse 92  Temp(Src) 98.3 F (36.8 C) (Oral)  Resp 16  Ht 5\' 3"  (1.6 m)  Wt 120 lb (54.432 kg)  BMI 21.26 kg/m2  SpO2 96%   BP Readings from Last 3 Encounters:  11/18/15 118/79  10/29/15 112/60  10/20/15 111/67     Wt Readings from Last 3 Encounters:  11/18/15 120 lb (54.432 kg)  10/29/15 123 lb (55.792 kg)  09/29/15 125 lb 8 oz (56.926 kg)     Physical Exam  PERRL EOMI HEART IS RRR  LCTA MUSCULOSKELETALno significant changes noted  Labs  Lab Results  Component Value Date   HGBA1C 5.9 07/03/2015   HGBA1C 7.1* 11/19/2014   HGBA1C 6.9* 04/22/2014   Lab Results  Component Value Date   MICROALBUR <0.7 11/28/2014   LDLCALC 64 10/11/2013   CREATININE 1.22* 09/29/2015    -------------------------------------------------------------------------------------------------------------------- Lab Results  Component Value Date   WBC 8.0 10/20/2015   HGB 9.9* 10/20/2015   HCT 30.6* 10/20/2015   PLT 299 10/20/2015   GLUCOSE 119* 09/29/2015   CHOL 164 10/11/2013   TRIG 152.0* 10/11/2013   HDL 69.70 10/11/2013   LDLDIRECT 28.0 07/03/2015   LDLCALC 64 10/11/2013   ALT 10 09/29/2015   AST 19 09/29/2015   NA 141 09/29/2015   K 4.3 09/29/2015   CL 102 09/29/2015   CREATININE 1.22* 09/29/2015   BUN 23 09/29/2015   CO2 29 09/29/2015   TSH 2.916 04/01/2015   INR 1.22 06/10/2015   HGBA1C 5.9 07/03/2015   MICROALBUR <0.7 11/28/2014    --------------------------------------------------------------------------------------------------------------------- Mm Diag Breast Tomo Bilateral  09/24/2015  CLINICAL DATA:  81 year old female presenting for routine postlumpectomy follow-up. The patient has had a left breast lumpectomy in 2015 followed by radiation therapy. EXAM: DIGITAL DIAGNOSTIC BILATERAL MAMMOGRAM WITH 3D TOMOSYNTHESIS  AND CAD COMPARISON:  Previous exam(s). ACR Breast Density Category d: The breast tissue is extremely dense, which lowers the sensitivity of mammography. FINDINGS: There is a marked increase in apparent density of the breast bilaterally, due to marked weight loss in the since the prior mammogram. Interval changes in the upper-outer quadrant of the left breast, consistent with postlumpectomy changes. No suspicious calcifications, masses or areas of distortion are seen in the bilateral breasts. Mammographic images were processed with CAD. IMPRESSION: 1. Interval left breast lumpectomy, without evidence of disease recurrence. 2.  No mammographic evidence of malignancy in the bilateral breasts. 3. Marked weight loss causing increased density of the bilateral breasts. RECOMMENDATION: Diagnostic mammogram is suggested in 1 year. (Code:[id number]-B-01Y) I have discussed the findings and recommendations with the patient. Results were  also provided in writing at the conclusion of the visit. If applicable, a reminder letter will be sent to the patient regarding the next appointment. BI-RADS CATEGORY  2: Benign. Electronically Signed   By: Frederico Hamman M.D.   On: 09/24/2015 13:56     Assessment & Plan:   Quatasia was seen today for back pain.  Diagnoses and all orders for this visit:  DDD (degenerative disc disease), lumbar  Spinal stenosis of lumbar region  Sciatica associated with disorder of lumbar spine, right        ----------------------------------------------------------------------------------------------------------------------  Problem List Items Addressed This Visit    None    Visit Diagnoses    DDD (degenerative disc disease), lumbar    -  Primary    Spinal stenosis of lumbar region        Sciatica associated with disorder of lumbar spine, right             ----------------------------------------------------------------------------------------------------------------------  1. DDD  (degenerative disc disease), lumbar Plan on an epidural steroid injection today with return to clinic in 1 month for repeat injection  2. Spinal stenosis of lumbar region Epidural injection today  3. Sciatica associated with disorder of lumbar spine, right     ----------------------------------------------------------------------------------------------------------------------  I am having Ms. Olmsted maintain her SYNTHROID, hydroxychloroquine, letrozole, Tadalafil (PAH), acetaminophen, Fluticasone-Salmeterol, loperamide, azelastine, levocetirizine, mycophenolate, promethazine, predniSONE, pantoprazole sodium, feeding supplement (JEVITY 1.5 CAL/FIBER), ALPRAZolam, free water, ondansetron, sucralfate, cholecalciferol, furosemide, metoprolol succinate, budesonide-formoterol, levofloxacin, chlorpheniramine-HYDROcodone, metoCLOPramide, cyanocobalamin, ranitidine, and oxyCODONE-acetaminophen.   No orders of the defined types were placed in this encounter.   Patient's Medications  New Prescriptions   No medications on file  Previous Medications   ACETAMINOPHEN (TYLENOL) 325 MG TABLET    Take 650 mg by mouth every 4 (four) hours as needed for mild pain or fever.   ALPRAZOLAM (XANAX) 0.25 MG TABLET    Take 1 tablet (0.25 mg total) by mouth at bedtime as needed for anxiety.   AZELASTINE (ASTELIN) 0.1 % NASAL SPRAY    Place 1 spray into both nostrils 2 (two) times daily. Reported on 10/29/2015   BUDESONIDE-FORMOTEROL (SYMBICORT) 160-4.5 MCG/ACT INHALER    Inhale 2 puffs into the lungs 2 (two) times daily. Reported on 10/29/2015   CHLORPHENIRAMINE-HYDROCODONE (TUSSIONEX PENNKINETIC ER) 10-8 MG/5ML SUER    Take 5 mLs by mouth at bedtime as needed for cough.   CHOLECALCIFEROL (VITAMIN D) 400 UNITS TABS TABLET    Take 400 Units by mouth.   CYANOCOBALAMIN (,VITAMIN B-12,) 1000 MCG/ML INJECTION    Inject 1 mL (1,000 mcg total) into the muscle every 30 (thirty) days. Pt uses on the 1st of every month.    FLUTICASONE-SALMETEROL (ADVAIR) 250-50 MCG/DOSE AEPB    Inhale 1 puff into the lungs 2 (two) times daily. Reported on 09/29/2015   FUROSEMIDE (LASIX) 20 MG TABLET    Take 20 mg by mouth.   HYDROXYCHLOROQUINE (PLAQUENIL) 200 MG TABLET    Take 200 mg by mouth daily.   LETROZOLE (FEMARA) 2.5 MG TABLET    Take 2.5 mg by mouth daily.   LEVOCETIRIZINE (XYZAL) 5 MG TABLET    Take 5 mg by mouth daily.   LEVOFLOXACIN (LEVAQUIN) 500 MG TABLET    Take 1 tablet (500 mg total) by mouth daily.   LOPERAMIDE (IMODIUM) 2 MG CAPSULE    Take 2-4 mg by mouth as needed for diarrhea or loose stools.   METOCLOPRAMIDE (REGLAN) 10 MG TABLET    Take 1 tablet (10 mg total)  by mouth 4 (four) times daily -  before meals and at bedtime.   METOPROLOL SUCCINATE (TOPROL-XL) 25 MG 24 HR TABLET    Take 25 mg by mouth daily.   MYCOPHENOLATE (CELLCEPT) 250 MG CAPSULE    Take 1,000 mg by mouth 2 (two) times daily.   NUTRITIONAL SUPPLEMENTS (FEEDING SUPPLEMENT, JEVITY 1.5 CAL/FIBER,) LIQD    Place 1,000 mLs into feeding tube continuous.   ONDANSETRON (ZOFRAN-ODT) 4 MG DISINTEGRATING TABLET    Take 1 tablet (4 mg total) by mouth every 4 (four) hours as needed for nausea or vomiting.   OXYCODONE-ACETAMINOPHEN (PERCOCET/ROXICET) 5-325 MG TABLET    Take 1 tablet by mouth daily as needed for severe pain.   PANTOPRAZOLE SODIUM (PROTONIX) 40 MG/20 ML PACK    Place 40 mg into feeding tube 2 (two) times daily.   PREDNISONE (DELTASONE) 5 MG TABLET    Take 5 mg by mouth daily. Pt takes in addition to the 1mg  tablet.   PROMETHAZINE (PHENERGAN) 25 MG TABLET    Take 6.25-12.5 mg by mouth every 4 (four) hours as needed for nausea or vomiting. Reported on 10/29/2015   RANITIDINE (ZANTAC) 150 MG TABLET    TAKE 1 TABLET (150 MG TOTAL) BY MOUTH AT BEDTIME.   SUCRALFATE (CARAFATE) 1 G TABLET    Take 1 tablet (1 g total) by mouth 4 (four) times daily -  with meals and at bedtime.   SYNTHROID 150 MCG TABLET    Take 1 tablet (150 mcg total) by mouth daily.    TADALAFIL, PAH, (ADCIRCA) 20 MG TABS    Take 40 mg by mouth daily.   WATER FOR IRRIGATION, STERILE (FREE WATER) SOLN    Place 150 mLs into feeding tube 2 (two) times daily.  Modified Medications   No medications on file  Discontinued Medications   No medications on file   ----------------------------------------------------------------------------------------------------------------------  Follow-up: No Follow-up on file.   Procedure. Procedure: L5-S1#2  LESI with fluoroscopic guidance and moderate sedation  NOTE: The risks, benefits, and expectations of the procedure have been discussed and explained to the patient who was understanding and in agreement with suggested treatment plan. No guarantees were made.  DESCRIPTION OF PROCEDURE: Lumbar epidural steroid injection with IV Versed, EKG, blood pressure, pulse, and pulse oximetry monitoring. The procedure was performed with the patient in the prone position under fluoroscopic guidance. A local anesthetic skin wheal of 1.5% plain lidocaine was performed at the appropriate site after fluoroscopic identifictation  Using strict aseptic technique, 1 mg of Versed was titrated for moderate sedation and I then advanced an 18-gauge Tuohy epidural needle in the midline via loss-of-resistance to saline. There was negative aspiration for heme or  CSF.  I then confirmed position with both AP and Lateral fluoroscan. At L5-S1  A total of 5 mL of Preservative-Free normal saline with 40 mg of Kenalog and 1cc Ropicaine 0.2 percent was injected incrementally via the  epidurally placed needle. Needle removed. The patient tolerated the injection well and was convalesced and discharged to home in stable condition. If the patient has any post procedure difficulty they have been instructed on how to contact us for assistance.   [email or web address]   Yevette Edwards, MD

## 2015-11-18 NOTE — Patient Instructions (Signed)
GENERAL RISKS AND COMPLICATIONS  What are the risk, side effects and possible complications? Generally speaking, most procedures are safe.  However, with any procedure there are risks, side effects, and the possibility of complications.  The risks and complications are dependent upon the sites that are lesioned, or the type of nerve block to be performed.  The closer the procedure is to the spine, the more serious the risks are.  Great care is taken when placing the radio frequency needles, block needles or lesioning probes, but sometimes complications can occur. 1. Infection: Any time there is an injection through the skin, there is a risk of infection.  This is why sterile conditions are used for these blocks.  There are four possible types of infection. 1. Localized skin infection. 2. Central Nervous System Infection-This can be in the form of Meningitis, which can be deadly. 3. Epidural Infections-This can be in the form of an epidural abscess, which can cause pressure inside of the spine, causing compression of the spinal cord with subsequent paralysis. This would require an emergency surgery to decompress, and there are no guarantees that the patient would recover from the paralysis. 4. Discitis-This is an infection of the intervertebral discs.  It occurs in about 1% of discography procedures.  It is difficult to treat and it may lead to surgery.        2. Pain: the needles have to go through skin and soft tissues, will cause soreness.       3. Damage to internal structures:  The nerves to be lesioned may be near blood vessels or    other nerves which can be potentially damaged.       4. Bleeding: Bleeding is more common if the patient is taking blood thinners such as  aspirin, Coumadin, Ticiid, Plavix, etc., or if he/she have some genetic predisposition  such as hemophilia. Bleeding into the spinal canal can cause compression of the spinal  cord with subsequent paralysis.  This would require an  emergency surgery to  decompress and there are no guarantees that the patient would recover from the  paralysis.       5. Pneumothorax:  Puncturing of a lung is a possibility, every time a needle is introduced in  the area of the chest or upper back.  Pneumothorax refers to free air around the  collapsed lung(s), inside of the thoracic cavity (chest cavity).  Another two possible  complications related to a similar event would include: Hemothorax and Chylothorax.   These are variations of the Pneumothorax, where instead of air around the collapsed  lung(s), you may have blood or chyle, respectively.       6. Spinal headaches: They may occur with any procedures in the area of the spine.       7. Persistent CSF (Cerebro-Spinal Fluid) leakage: This is a rare problem, but may occur  with prolonged intrathecal or epidural catheters either due to the formation of a fistulous  track or a dural tear.       8. Nerve damage: By working so close to the spinal cord, there is always a possibility of  nerve damage, which could be as serious as a permanent spinal cord injury with  paralysis.       9. Death:  Although rare, severe deadly allergic reactions known as "Anaphylactic  reaction" can occur to any of the medications used.      10. Worsening of the symptoms:  We can always make thing worse.    What are the chances of something like this happening? Chances of any of this occuring are extremely low.  By statistics, you have more of a chance of getting killed in a motor vehicle accident: while driving to the hospital than any of the above occurring .  Nevertheless, you should be aware that they are possibilities.  In general, it is similar to taking a shower.  Everybody knows that you can slip, hit your head and get killed.  Does that mean that you should not shower again?  Nevertheless always keep in mind that statistics do not mean anything if you happen to be on the wrong side of them.  Even if a procedure has a 1  (one) in a 1,000,000 (million) chance of going wrong, it you happen to be that one..Also, keep in mind that by statistics, you have more of a chance of having something go wrong when taking medications.  Who should not have this procedure? If you are on a blood thinning medication (e.g. Coumadin, Plavix, see list of "Blood Thinners"), or if you have an active infection going on, you should not have the procedure.  If you are taking any blood thinners, please inform your physician.  How should I prepare for this procedure?  Do not eat or drink anything at least six hours prior to the procedure.  Bring a driver with you .  It cannot be a taxi.  Come accompanied by an adult that can drive you back, and that is strong enough to help you if your legs get weak or numb from the local anesthetic.  Take all of your medicines the morning of the procedure with just enough water to swallow them.  If you have diabetes, make sure that you are scheduled to have your procedure done first thing in the morning, whenever possible.  If you have diabetes, take only half of your insulin dose and notify our nurse that you have done so as soon as you arrive at the clinic.  If you are diabetic, but only take blood sugar pills (oral hypoglycemic), then do not take them on the morning of your procedure.  You may take them after you have had the procedure.  Do not take aspirin or any aspirin-containing medications, at least eleven (11) days prior to the procedure.  They may prolong bleeding.  Wear loose fitting clothing that may be easy to take off and that you would not mind if it got stained with Betadine or blood.  Do not wear any jewelry or perfume  Remove any nail coloring.  It will interfere with some of our monitoring equipment.  NOTE: Remember that this is not meant to be interpreted as a complete list of all possible complications.  Unforeseen problems may occur.  BLOOD THINNERS The following drugs  contain aspirin or other products, which can cause increased bleeding during surgery and should not be taken for 2 weeks prior to and 1 week after surgery.  If you should need take something for relief of minor pain, you may take acetaminophen which is found in Tylenol,m Datril, Anacin-3 and Panadol. It is not blood thinner. The products listed below are.  Do not take any of the products listed below in addition to any listed on your instruction sheet.  A.P.C or A.P.C with Codeine Codeine Phosphate Capsules #3 Ibuprofen Ridaura  ABC compound Congesprin Imuran rimadil  Advil Cope Indocin Robaxisal  Alka-Seltzer Effervescent Pain Reliever and Antacid Coricidin or Coricidin-D  Indomethacin Rufen    Alka-Seltzer plus Cold Medicine Cosprin Ketoprofen S-A-C Tablets  Anacin Analgesic Tablets or Capsules Coumadin Korlgesic Salflex  Anacin Extra Strength Analgesic tablets or capsules CP-2 Tablets Lanoril Salicylate  Anaprox Cuprimine Capsules Levenox Salocol  Anexsia-D Dalteparin Magan Salsalate  Anodynos Darvon compound Magnesium Salicylate Sine-off  Ansaid Dasin Capsules Magsal Sodium Salicylate  Anturane Depen Capsules Marnal Soma  APF Arthritis pain formula Dewitt's Pills Measurin Stanback  Argesic Dia-Gesic Meclofenamic Sulfinpyrazone  Arthritis Bayer Timed Release Aspirin Diclofenac Meclomen Sulindac  Arthritis pain formula Anacin Dicumarol Medipren Supac  Analgesic (Safety coated) Arthralgen Diffunasal Mefanamic Suprofen  Arthritis Strength Bufferin Dihydrocodeine Mepro Compound Suprol  Arthropan liquid Dopirydamole Methcarbomol with Aspirin Synalgos  ASA tablets/Enseals Disalcid Micrainin Tagament  Ascriptin Doan's Midol Talwin  Ascriptin A/D Dolene Mobidin Tanderil  Ascriptin Extra Strength Dolobid Moblgesic Ticlid  Ascriptin with Codeine Doloprin or Doloprin with Codeine Momentum Tolectin  Asperbuf Duoprin Mono-gesic Trendar  Aspergum Duradyne Motrin or Motrin IB Triminicin  Aspirin  plain, buffered or enteric coated Durasal Myochrisine Trigesic  Aspirin Suppositories Easprin Nalfon Trillsate  Aspirin with Codeine Ecotrin Regular or Extra Strength Naprosyn Uracel  Atromid-S Efficin Naproxen Ursinus  Auranofin Capsules Elmiron Neocylate Vanquish  Axotal Emagrin Norgesic Verin  Azathioprine Empirin or Empirin with Codeine Normiflo Vitamin E  Azolid Emprazil Nuprin Voltaren  Bayer Aspirin plain, buffered or children's or timed BC Tablets or powders Encaprin Orgaran Warfarin Sodium  Buff-a-Comp Enoxaparin Orudis Zorpin  Buff-a-Comp with Codeine Equegesic Os-Cal-Gesic   Buffaprin Excedrin plain, buffered or Extra Strength Oxalid   Bufferin Arthritis Strength Feldene Oxphenbutazone   Bufferin plain or Extra Strength Feldene Capsules Oxycodone with Aspirin   Bufferin with Codeine Fenoprofen Fenoprofen Pabalate or Pabalate-SF   Buffets II Flogesic Panagesic   Buffinol plain or Extra Strength Florinal or Florinal with Codeine Panwarfarin   Buf-Tabs Flurbiprofen Penicillamine   Butalbital Compound Four-way cold tablets Penicillin   Butazolidin Fragmin Pepto-Bismol   Carbenicillin Geminisyn Percodan   Carna Arthritis Reliever Geopen Persantine   Carprofen Gold's salt Persistin   Chloramphenicol Goody's Phenylbutazone   Chloromycetin Haltrain Piroxlcam   Clmetidine heparin Plaquenil   Cllnoril Hyco-pap Ponstel   Clofibrate Hydroxy chloroquine Propoxyphen         Before stopping any of these medications, be sure to consult the physician who ordered them.  Some, such as Coumadin (Warfarin) are ordered to prevent or treat serious conditions such as "deep thrombosis", "pumonary embolisms", and other heart problems.  The amount of time that you may need off of the medication may also vary with the medication and the reason for which you were taking it.  If you are taking any of these medications, please make sure you notify your pain physician before you undergo any  procedures.         Epidural Steroid Injection Patient Information  Description: The epidural space surrounds the nerves as they exit the spinal cord.  In some patients, the nerves can be compressed and inflamed by a bulging disc or a tight spinal canal (spinal stenosis).  By injecting steroids into the epidural space, we can bring irritated nerves into direct contact with a potentially helpful medication.  These steroids act directly on the irritated nerves and can reduce swelling and inflammation which often leads to decreased pain.  Epidural steroids may be injected anywhere along the spine and from the neck to the low back depending upon the location of your pain.   After numbing the skin with local anesthetic (like Novocaine), a small needle is passed   into the epidural space slowly.  You may experience a sensation of pressure while this is being done.  The entire block usually last less than 10 minutes.  Conditions which may be treated by epidural steroids:   Low back and leg pain  Neck and arm pain  Spinal stenosis  Post-laminectomy syndrome  Herpes zoster (shingles) pain  Pain from compression fractures  Preparation for the injection:  1. Do not eat any solid food or dairy products within 6 hours of your appointment.  2. You may drink clear liquids up to 2 hours before appointment.  Clear liquids include water, black coffee, juice or soda.  No milk or cream please. 3. You may take your regular medication, including pain medications, with a sip of water before your appointment  Diabetics should hold regular insulin (if taken separately) and take 1/2 normal NPH dos the morning of the procedure.  Carry some sugar containing items with you to your appointment. 4. A driver must accompany you and be prepared to drive you home after your procedure.  5. Bring all your current medications with your. 6. An IV may be inserted and sedation may be given at the discretion of the  physician.   7. A blood pressure cuff, EKG and other monitors will often be applied during the procedure.  Some patients may need to have extra oxygen administered for a short period. 8. You will be asked to provide medical information, including your allergies, prior to the procedure.  We must know immediately if you are taking blood thinners (like Coumadin/Warfarin)  Or if you are allergic to IV iodine contrast (dye). We must know if you could possible be pregnant.  Possible side-effects:  Bleeding from needle site  Infection (rare, may require surgery)  Nerve injury (rare)  Numbness & tingling (temporary)  Difficulty urinating (rare, temporary)  Spinal headache ( a headache worse with upright posture)  Light -headedness (temporary)  Pain at injection site (several days)  Decreased blood pressure (temporary)  Weakness in arm/leg (temporary)  Pressure sensation in back/neck (temporary)  Call if you experience:  Fever/chills associated with headache or increased back/neck pain.  Headache worsened by an upright position.  New onset weakness or numbness of an extremity below the injection site  Hives or difficulty breathing (go to the emergency room)  Inflammation or drainage at the infection site  Severe back/neck pain  Any new symptoms which are concerning to you  Please note:  Although the local anesthetic injected can often make your back or neck feel good for several hours after the injection, the pain will likely return.  It takes 3-7 days for steroids to work in the epidural space.  You may not notice any pain relief for at least that one week.  If effective, we will often do a series of three injections spaced 3-6 weeks apart to maximally decrease your pain.  After the initial series, we generally will wait several months before considering a repeat injection of the same type.  If you have any questions, please call [phone] [facility]

## 2015-11-19 ENCOUNTER — Telehealth: Payer: Self-pay | Admitting: *Deleted

## 2015-11-19 NOTE — Telephone Encounter (Signed)
Patient verbalizes no questions or concerns from procedure on yesterday.

## 2015-11-20 ENCOUNTER — Other Ambulatory Visit: Payer: Self-pay | Admitting: Pharmacist

## 2015-11-20 NOTE — Patient Outreach (Signed)
[facility]) Care Management  11/20/2015  Diana [name] Feb 04, 1935 ID:[id number]  Patient was referred to [place] for medication review by Sonda Rumble, RN [facility] Telephonic CM.  [facility] Clinical Pharmacist called patient today and patient answered phone call.  She reported "I am in the middle of something, can you call back another day."  Will attempt to reach patient a different day.    [name], PharmD, [place] [phone]

## 2015-11-30 ENCOUNTER — Other Ambulatory Visit: Payer: Self-pay | Admitting: Pharmacist

## 2015-11-30 NOTE — Patient Outreach (Signed)
[facility]) Care Management  11/30/2015  Diana [name] [birth year] [id number]  Second attempt to reach patient via phone by [place] Pharmacist.  No answer.  Will continue to attempt to reach patient.   [name], PharmD, [place] [phone]

## 2015-12-07 ENCOUNTER — Other Ambulatory Visit: Payer: Self-pay | Admitting: Pharmacist

## 2015-12-07 NOTE — Patient Outreach (Signed)
[facility]) Care Management  12/07/2015  KEYUNDRA Diana [name] 1934-11-29 [id number]   Attempted to reach patient to complete medication reconciliation over phone, third attempt.   Patient was referred to [place] by [facility] Telephonic RN.    Will complete medication reconciliation note based on information from [place] medication list and fill information from [place], [place].    Will also send patient a letter and will close out pharmacy case if no contact from patient in 10 business days.   Patient appears to see multiple providers with PCP Dr Derrel Nip at [facility], GI at [place], and Nephrology, Rheumatology, and Pulmonology at [facility].    Drugs sorted by system:  Neurologic/Psychologic:  -Alprazolam 0.25 mg, last written 05/2015 with no refills by hospitalist   Cardiovascular:  -Furosemide 20 mg daily -Metoprolol succinate 25 mg daily recorded on [facility] Epic medication list, [facility] is filling with directions for 1/2 tablet daily  Pulmonary/Allergy:  -Azelastine 0.1% nasal spray  -Advair (fluticasone/salmeterol) 250/50 mcg 1 inhalation twice daily -Levocetirizine 5 mg daily -Symbicort (budesonide/formoterol) 160/4.5 mcg 2 inhalations twice daily -Adcirca (tadalafil) 20 mg---[facility]     Gastrointestinal:  -Metoclopramide 10 mg four times daily -Loperamide 2 mg as needed  -Ondansetron ODT 4 mg every 4 hours as needed, nausea/vomiting -Pantoprazole 40 mg twice daily---[facility] -Promethazine 25 mg---6.25 mg-12.5 mg every 4 hours as needed, nausea/vomiting -Ranitidine 150 mg at bedtime -Sucralfate 1 gram (suspension) four times daily---[facility]  Endocrine:  -Levothyroxine 150 mcg daily  Pain:  -Acetaminophen 325 mg -Oxycodone/acetaminophen 5/325 mg daily as needed  Vitamins/Minerals:  -Cyanocobalamin 1000 mcg injected every 30 days  -Cholecalciferol 400 international units daily  Infectious  disease:  Levofloxacin 500 mg daily---last written 09/29/15  Miscellaneous:  -Tussionex, last written 09/2015 -Hydroxychloroquine 200 mg daily---[facility] -Letrozole 2.5 mg daily -Mycophenolate mofetil 1000 mg twice daily---[facility] -Prednisone 5 mg---[facility]   Duplications in therapy:   Both Advair and Symbicort are recorded on her active medication list, [facility] reports no fill history for either of these inhalers, she may be obtaining from a different pharmacy.  Suggest clarifying with patient which inhaler she is using.  Drug interactions:   If taken at the same time, sucralfate may decrease the absorption of levothyroxine.  Suggest obtaining a TSH level to determine if levothyroxine dose is sufficient with concomitant sucralfate therapy.   Other issues noted:   The following prescriptions appear to have been one time prescriptions, suggest removing them from active medication if patient no longer using them:  -Alprazolam -Levofloxacin -Tussionex  Dose discrepancy: [facility] Epic medication list notes metoprolol succinate 25 mg daily and [facility] 25 mg tablets, take 1/2 tablet daily.  Suggest verifying dose.     [name], PharmD, [place] [phone]

## 2015-12-08 ENCOUNTER — Encounter: Payer: Self-pay | Admitting: Pharmacist

## 2015-12-09 ENCOUNTER — Telehealth: Payer: Self-pay

## 2015-12-09 ENCOUNTER — Encounter: Payer: Self-pay | Admitting: Pharmacist

## 2015-12-09 NOTE — Telephone Encounter (Signed)
Nutrition Referral Received: Per referral Dr. Gustavo Lah had wanted RD to call to discuss pt status before scheduling appointment.    Spoke with Dr. Gustavo Lah this pm regarding pt history, current status and concerns. Wants pt to receive majority of nutrition from PEG tube feeding and for pt to consume mostly full liquid diet orally at this time.   Will meet with pt as soon as appointment can be made.  Alysiana Ethridge B. Zenia Resides, [place], [place] (pager) Weekend/On-Call pager [phone])

## 2015-12-10 ENCOUNTER — Ambulatory Visit: Payer: Self-pay | Admitting: *Deleted

## 2015-12-11 ENCOUNTER — Encounter: Payer: Self-pay | Admitting: *Deleted

## 2015-12-11 ENCOUNTER — Other Ambulatory Visit: Payer: Self-pay | Admitting: *Deleted

## 2015-12-11 ENCOUNTER — Ambulatory Visit: Payer: Self-pay | Admitting: *Deleted

## 2015-12-11 NOTE — Patient Outreach (Signed)
[facility]) Care Management  [facility] Care Manager  12/11/2015   Diana [name] July 01, 1935 [id number]  Subjective: RN Health Coach telephone call to patient.  [employer] compliance verified. Per patient she is aware of the zones but needs additional information. Patient is not having any swelling or weight gain. Per patient she is loosing weight and is down to 115 pounds. Per patient she is taking Jevity per peg feeding and oral protein supplements. Per patient she does not exercise due to back pain. Per patient she able to stand and that is when her back hurts. Per patient she takes 1/2 percocet for pain. Patient has agreed to follow outreach calls  for congestive heart failure.     Objective:   Current Medications:  Current Outpatient Prescriptions  Medication Sig Dispense Refill  . acetaminophen (TYLENOL) 325 MG tablet Take 650 mg by mouth every 4 (four) hours as needed for mild pain or fever.    . ALPRAZolam (XANAX) 0.25 MG tablet Take 1 tablet (0.25 mg total) by mouth at bedtime as needed for anxiety. 20 tablet 0  . budesonide-formoterol (SYMBICORT) 160-4.5 MCG/ACT inhaler Inhale 2 puffs into the lungs 2 (two) times daily. Reported on 10/29/2015    . cholecalciferol (VITAMIN D) 400 UNITS TABS tablet Take 400 Units by mouth.    . cyanocobalamin (,VITAMIN B-12,) 1000 MCG/ML injection Inject 1 mL (1,000 mcg total) into the muscle every 30 (thirty) days. Pt uses on the 1st of every month. 10 mL 1  . furosemide (LASIX) 20 MG tablet Take 20 mg by mouth.    . hydroxychloroquine (PLAQUENIL) 200 MG tablet Take 200 mg by mouth daily.    Marland Kitchen letrozole (FEMARA) 2.5 MG tablet Take 2.5 mg by mouth daily.    Marland Kitchen levocetirizine (XYZAL) 5 MG tablet Take 5 mg by mouth daily.    Marland Kitchen loperamide (IMODIUM) 2 MG capsule Take 2-4 mg by mouth as needed for diarrhea or loose stools.    . metoCLOPramide (REGLAN) 10 MG tablet Take 1 tablet (10 mg total) by mouth 4 (four) times daily -  before meals and at  bedtime. 120 tablet 5  . metoprolol succinate (TOPROL-XL) 25 MG 24 hr tablet Take 25 mg by mouth daily.    . mycophenolate (CELLCEPT) 250 MG capsule Take 1,000 mg by mouth 2 (two) times daily.    . Nutritional Supplements (FEEDING SUPPLEMENT, JEVITY 1.5 CAL/FIBER,) LIQD Place 1,000 mLs into feeding tube continuous. 30000 mL 1  . ondansetron (ZOFRAN-ODT) 4 MG disintegrating tablet Take 1 tablet (4 mg total) by mouth every 4 (four) hours as needed for nausea or vomiting. 60 tablet 3  . oxyCODONE-acetaminophen (PERCOCET/ROXICET) 5-325 MG tablet Take 1 tablet by mouth daily as needed for severe pain. 30 tablet 0  . pantoprazole sodium (PROTONIX) 40 mg/20 mL PACK Place 40 mg into feeding tube 2 (two) times daily.    . predniSONE (DELTASONE) 5 MG tablet Take 5 mg by mouth daily. Pt takes in addition to the 1mg  tablet.    . promethazine (PHENERGAN) 25 MG tablet Take 6.25-12.5 mg by mouth every 4 (four) hours as needed for nausea or vomiting. Reported on 10/29/2015    . sucralfate (CARAFATE) 1 G tablet Take 1 tablet (1 g total) by mouth 4 (four) times daily -  with meals and at bedtime. 120 tablet 0  . SYNTHROID 150 MCG tablet Take 1 tablet (150 mcg total) by mouth daily. 30 tablet 11  . Tadalafil, PAH, (ADCIRCA) 20 MG TABS  Take 40 mg by mouth daily.    . Water For Irrigation, Sterile (FREE WATER) SOLN Place 150 mLs into feeding tube 2 (two) times daily. 1500 mL 2  . azelastine (ASTELIN) 0.1 % nasal spray Place 1 spray into both nostrils 2 (two) times daily. Reported on 12/11/2015    . chlorpheniramine-HYDROcodone (TUSSIONEX PENNKINETIC ER) 10-8 MG/5ML SUER Take 5 mLs by mouth at bedtime as needed for cough. (Patient not taking: Reported on 12/11/2015) 140 mL 0  . Fluticasone-Salmeterol (ADVAIR) 250-50 MCG/DOSE AEPB Inhale 1 puff into the lungs 2 (two) times daily. Reported on 12/11/2015    . levofloxacin (LEVAQUIN) 500 MG tablet Take 1 tablet (500 mg total) by mouth daily. (Patient not taking: Reported on  10/20/2015) 7 tablet 0  . ranitidine (ZANTAC) 150 MG tablet TAKE 1 TABLET (150 MG TOTAL) BY MOUTH AT BEDTIME. (Patient not taking: Reported on 12/11/2015) 90 tablet 2   Current Facility-Administered Medications  Medication Dose Route Frequency Provider Last Rate Last Dose  . iohexol (OMNIPAQUE) 180 MG/ML injection 20 mL  20 mL Other Once PRN Yevette Edwards, MD      . lidocaine (PF) (XYLOCAINE) 1 % injection 5 mL  5 mL Subcutaneous Once Yevette Edwards, MD      . midazolam (VERSED) 5 MG/5ML injection 5 mg  5 mg Intravenous Once Yevette Edwards, MD      . ropivacaine (PF) 2 mg/ml (0.2%) (NAROPIN) epidural 10 mL  10 mL Epidural Once Yevette Edwards, MD      . sodium chloride flush (NS) 0.9 % injection 10 mL  10 mL Other Once Yevette Edwards, MD      . triamcinolone acetonide (KENALOG-40) injection 40 mg  40 mg Other Once Yevette Edwards, MD        Functional Status:  In your present state of health, do you have any difficulty performing the following activities: 12/11/2015 11/10/2015  Hearing? N -  Vision? N -  Difficulty concentrating or making decisions? N -  Walking or climbing stairs? Y -  Dressing or bathing? N N  Doing errands, shopping? Y -  Quarry manager and eating ? N -  Using the Toilet? N -  In the past six months, have you accidently leaked urine? N -  Do you have problems with loss of bowel control? N -  Managing your Medications? N N  Managing your Finances? N N  Housekeeping or managing your Housekeeping? [name]    Fall/Depression Screening: PHQ 2/9 Scores 12/11/2015 11/18/2015 11/10/2015 10/29/2015 04/22/2014 09/14/2012  PHQ - 2 Score 0 0 0 - 0 0  Exception Documentation - Patient refusal - Patient refusal - -   THN CM Care Plan Problem One        Most Recent Value   Care Plan Problem One  Knowledge deficit in self management of congestive heart falure   Role Documenting the Problem One  Health Coach   Care Plan for Problem One  Active   THN Long Term Goal (31-90 days)  Patient will not  have any admissions for CHF within the next 90 days   THN Long Term Goal Start Date  12/11/15   Interventions for Problem One Long Term Goal  RN reminded patient to keep appointments with PCP and other physician that patient is seeing. Patient reminded the importance of taking medications as per order. RN Health Coach will monitor patient monthly telephonic.   THN CM Short Term Goal #1 (0-30 days)  Patient will be able to verbalize 3 signs ans symptoms of CHF within the next 30 days   THN CM Short Term Goal #1 Start Date  12/11/15   Interventions for Short Term Goal #1  RN will send educational material on Congestive heart failure zones. RN will send a large magnet for CHF to place on refrigerator. RN will follow up with discussion and teachback.   THN CM Short Term Goal #2 (0-30 days)  Patient will weigh daily and record in log within the next 30 days   THN CM Short Term Goal #2 Start Date  12/11/15   Interventions for Short Term Goal #2  RN Health Coach  will send patient a calendar book for documentation.  RN discussed the importance of weighing same time each day.RN discussed the importance of weighing and recording any weight gain of 3 pounds in a day or 5 pounds in a week.  Patient is to notify doctor.       Assessment: Patient will benefit from Health Coach telephonic outreach for education and support for Congestive Heart Failure self management  Plan: RN will send patient a calendar book for documentation RN will send patient a large CHF zone magnet for refrigerator  RN will send patient EMMI on keeping track of your weight each day RN will send EMMI information on Heart Failure when to call Dr or 911 RN will send EMMI Working with your Doctor RN will send educational information on Congestive Heart Failure RN will follow up within a month with discussion and teach back on CHF  Gean Maidens BSN RN [facility] Care Management [phone]

## 2015-12-11 NOTE — Telephone Encounter (Signed)
This was a duplicate erroneous telephone encounter.

## 2015-12-14 ENCOUNTER — Other Ambulatory Visit: Payer: Self-pay | Admitting: Pharmacist

## 2015-12-14 ENCOUNTER — Telehealth: Payer: Self-pay | Admitting: Anesthesiology

## 2015-12-14 NOTE — Telephone Encounter (Signed)
Stopped blood thinner and will be 9 days on Wed 12-16-15, day of procedure, will this be long enough? Please call patient

## 2015-12-14 NOTE — Patient Outreach (Signed)
[facility]) Care Management  12/14/2015  Diana [name] 1935-03-21 ID:[id number]   Joaquim Lai RN, [place] called [facility] Pharmacist after noticing [facility] Pharmacist was unsuccessful reaching patient and stated that noon is the best time to try to reach patient.   Pharmacist attempted to reach patient around noon and call was answered, person identified themselves as patient's niece.  Pharmacist left name and phone number requesting patient call pharmacist back, did not leave other details about nature of call.    Given that four attempts to reach patient have been made and a letter has been mailed to patient, will wait to see if patient calls pharmacist back.  [name], PharmD, [place] [phone]

## 2015-12-15 NOTE — Telephone Encounter (Signed)
Patient notified per voicemail that 9 days is long enough to be off of blood thinner.

## 2015-12-16 ENCOUNTER — Ambulatory Visit: Payer: Medicare Other | Attending: Anesthesiology | Admitting: Anesthesiology

## 2015-12-16 ENCOUNTER — Encounter: Payer: Self-pay | Admitting: Anesthesiology

## 2015-12-16 ENCOUNTER — Other Ambulatory Visit: Payer: Self-pay | Admitting: Pharmacist

## 2015-12-16 VITALS — BP 118/65 | HR 80 | Temp 98.5°F | Resp 16 | Ht 63.0 in | Wt 116.0 lb

## 2015-12-16 DIAGNOSIS — M5136 Other intervertebral disc degeneration, lumbar region: Secondary | ICD-10-CM | POA: Insufficient documentation

## 2015-12-16 DIAGNOSIS — M4806 Spinal stenosis, lumbar region: Secondary | ICD-10-CM | POA: Diagnosis not present

## 2015-12-16 DIAGNOSIS — M5431 Sciatica, right side: Secondary | ICD-10-CM | POA: Diagnosis not present

## 2015-12-16 DIAGNOSIS — M1388 Other specified arthritis, other site: Secondary | ICD-10-CM | POA: Insufficient documentation

## 2015-12-16 DIAGNOSIS — M47817 Spondylosis without myelopathy or radiculopathy, lumbosacral region: Secondary | ICD-10-CM | POA: Insufficient documentation

## 2015-12-16 DIAGNOSIS — M549 Dorsalgia, unspecified: Secondary | ICD-10-CM | POA: Insufficient documentation

## 2015-12-16 DIAGNOSIS — M51369 Other intervertebral disc degeneration, lumbar region without mention of lumbar back pain or lower extremity pain: Secondary | ICD-10-CM

## 2015-12-16 DIAGNOSIS — M48061 Spinal stenosis, lumbar region without neurogenic claudication: Secondary | ICD-10-CM

## 2015-12-16 MED ORDER — LIDOCAINE HCL (PF) 1 % IJ SOLN
5.0000 mL | Freq: Once | INTRAMUSCULAR | Status: AC
  Administered 2015-12-16: 4 mL via SUBCUTANEOUS

## 2015-12-16 MED ORDER — TRIAMCINOLONE ACETONIDE 40 MG/ML IJ SUSP
INTRAMUSCULAR | Status: AC
  Administered 2015-12-16: 40 mg
  Filled 2015-12-16: qty 1

## 2015-12-16 MED ORDER — FENTANYL CITRATE (PF) 100 MCG/2ML IJ SOLN
INTRAMUSCULAR | Status: AC
  Administered 2015-12-16: 25 ug via INTRAVENOUS
  Filled 2015-12-16: qty 2

## 2015-12-16 MED ORDER — ROPIVACAINE HCL 2 MG/ML IJ SOLN
INTRAMUSCULAR | Status: AC
  Administered 2015-12-16: 9 mL via EPIDURAL
  Filled 2015-12-16: qty 10

## 2015-12-16 MED ORDER — LIDOCAINE HCL (PF) 1 % IJ SOLN
INTRAMUSCULAR | Status: AC
  Administered 2015-12-16: 4 mL via SUBCUTANEOUS
  Filled 2015-12-16: qty 5

## 2015-12-16 MED ORDER — ROPIVACAINE HCL 2 MG/ML IJ SOLN
10.0000 mL | Freq: Once | INTRAMUSCULAR | Status: AC
  Administered 2015-12-16: 9 mL via EPIDURAL

## 2015-12-16 MED ORDER — LACTATED RINGERS IV SOLN: 1000.0000 mL | INTRAVENOUS | Status: DC

## 2015-12-16 MED ORDER — TRIAMCINOLONE ACETONIDE 40 MG/ML IJ SUSP
40.0000 mg | Freq: Once | INTRAMUSCULAR | Status: AC
  Administered 2015-12-16: 40 mg

## 2015-12-16 MED ORDER — MIDAZOLAM HCL 5 MG/5ML IJ SOLN
5.0000 mg | Freq: Once | INTRAMUSCULAR | Status: AC
  Administered 2015-12-16: 1 mg via INTRAVENOUS

## 2015-12-16 MED ORDER — MIDAZOLAM HCL 5 MG/5ML IJ SOLN
INTRAMUSCULAR | Status: AC
  Administered 2015-12-16: 1 mg via INTRAVENOUS
  Filled 2015-12-16: qty 5

## 2015-12-16 NOTE — Progress Notes (Signed)
Patient here for procedure d/t lower back pain. Safety precautions to be maintained throughout the outpatient stay will include: orient to surroundings, keep bed in low position, maintain call bell within reach at all times, provide assistance with transfer out of bed and ambulation.  

## 2015-12-16 NOTE — Patient Outreach (Signed)
[facility]) Care Management  [facility]   12/16/2015  Diana [name] 1935-03-31 [id number]  Subjective:  Patient returned call to [facility] Pharmacist to review her active medication list.  Patient was knowledgeable over the phone about which medications she is presently taking and what they are for.    Patient did confirm that she has a Medicare Part D plan via [employer].    Patient reports that cost of sucralfate liquid from Dr Marva Panda is over $200/month.    Objective:   Current Medications: Current Outpatient Prescriptions  Medication Sig Dispense Refill  . acetaminophen (TYLENOL) 325 MG tablet Take 650 mg by mouth every 4 (four) hours as needed for mild pain or fever.    . cholecalciferol (VITAMIN D) 400 UNITS TABS tablet Take 400 Units by mouth.    . cyanocobalamin (,VITAMIN B-12,) 1000 MCG/ML injection Inject 1 mL (1,000 mcg total) into the muscle every 30 (thirty) days. Pt uses on the 1st of every month. 10 mL 1  . furosemide (LASIX) 20 MG tablet Take 20 mg by mouth daily as needed.     Marland Kitchen letrozole (FEMARA) 2.5 MG tablet Take 2.5 mg by mouth daily.    Marland Kitchen levocetirizine (XYZAL) 5 MG tablet Take 5 mg by mouth daily.    Marland Kitchen loperamide (IMODIUM) 2 MG capsule Take 2-4 mg by mouth as needed for diarrhea or loose stools.    . metoCLOPramide (REGLAN) 10 MG tablet Take 1 tablet (10 mg total) by mouth 4 (four) times daily -  before meals and at bedtime. 120 tablet 5  . metoprolol succinate (TOPROL-XL) 25 MG 24 hr tablet Take 25 mg by mouth daily.    . mycophenolate (CELLCEPT) 500 MG tablet Take 1,000 mg by mouth 2 (two) times daily.    . ondansetron (ZOFRAN-ODT) 4 MG disintegrating tablet Take 1 tablet (4 mg total) by mouth every 4 (four) hours as needed for nausea or vomiting. 60 tablet 3  . oxyCODONE-acetaminophen (PERCOCET/ROXICET) 5-325 MG tablet Take 1 tablet by mouth daily as needed for severe pain. 30 tablet 0  . pantoprazole sodium (PROTONIX) 40 mg/20 mL  PACK Place 40 mg into feeding tube 2 (two) times daily.    . predniSONE (DELTASONE) 5 MG tablet Take 5 mg by mouth daily.     . promethazine (PHENERGAN) 25 MG tablet Take 6.25-12.5 mg by mouth every 4 (four) hours as needed for nausea or vomiting. Reported on 10/29/2015    . sucralfate (CARAFATE) 1 G tablet Take 1 tablet (1 g total) by mouth 4 (four) times daily -  with meals and at bedtime. 120 tablet 0  . SYNTHROID 150 MCG tablet Take 1 tablet (150 mcg total) by mouth daily. 30 tablet 11  . Tadalafil, PAH, (ADCIRCA) 20 MG TABS Take 40 mg by mouth daily.    Marland Kitchen ALPRAZolam (XANAX) 0.25 MG tablet Take 1 tablet (0.25 mg total) by mouth at bedtime as needed for anxiety. (Patient not taking: Reported on 12/16/2015) 20 tablet 0  . azelastine (ASTELIN) 0.1 % nasal spray Place 1 spray into both nostrils 2 (two) times daily. Reported on 12/16/2015    . budesonide-formoterol (SYMBICORT) 160-4.5 MCG/ACT inhaler Inhale 2 puffs into the lungs 2 (two) times daily. Reported on 12/16/2015    . chlorpheniramine-HYDROcodone (TUSSIONEX PENNKINETIC ER) 10-8 MG/5ML SUER Take 5 mLs by mouth at bedtime as needed for cough. (Patient not taking: Reported on 12/11/2015) 140 mL 0  . Fluticasone-Salmeterol (ADVAIR) 250-50 MCG/DOSE AEPB Inhale 1  puff into the lungs 2 (two) times daily. Reported on 12/16/2015    . hydroxychloroquine (PLAQUENIL) 200 MG tablet Take 200 mg by mouth daily.    Marland Kitchen levofloxacin (LEVAQUIN) 500 MG tablet Take 1 tablet (500 mg total) by mouth daily. (Patient not taking: Reported on 10/20/2015) 7 tablet 0  . Nutritional Supplements (FEEDING SUPPLEMENT, JEVITY 1.5 CAL/FIBER,) LIQD Place 1,000 mLs into feeding tube continuous. 30000 mL 1  . ranitidine (ZANTAC) 150 MG tablet TAKE 1 TABLET (150 MG TOTAL) BY MOUTH AT BEDTIME. (Patient not taking: Reported on 12/11/2015) 90 tablet 2  . Water For Irrigation, Sterile (FREE WATER) SOLN Place 150 mLs into feeding tube 2 (two) times daily. 1500 mL 2   Current  Facility-Administered Medications  Medication Dose Route Frequency Provider Last Rate Last Dose  . iohexol (OMNIPAQUE) 180 MG/ML injection 20 mL  20 mL Other Once PRN Yevette Edwards, MD      . lactated ringers infusion 1,000 mL  1,000 mL Intravenous Continuous Yevette Edwards, MD      . lidocaine (PF) (XYLOCAINE) 1 % injection 5 mL  5 mL Subcutaneous Once Yevette Edwards, MD      . midazolam (VERSED) 5 MG/5ML injection 5 mg  5 mg Intravenous Once Yevette Edwards, MD      . ropivacaine (PF) 2 mg/ml (0.2%) (NAROPIN) epidural 10 mL  10 mL Epidural Once Yevette Edwards, MD      . sodium chloride flush (NS) 0.9 % injection 10 mL  10 mL Other Once Yevette Edwards, MD      . triamcinolone acetonide (KENALOG-40) injection 40 mg  40 mg Other Once Yevette Edwards, MD        Functional Status: In your present state of health, do you have any difficulty performing the following activities: 12/11/2015 11/10/2015  Hearing? N -  Vision? N -  Difficulty concentrating or making decisions? N -  Walking or climbing stairs? Y -  Dressing or bathing? N N  Doing errands, shopping? Y -  Quarry manager and eating ? N -  Using the Toilet? N -  In the past six months, have you accidently leaked urine? N -  Do you have problems with loss of bowel control? N -  Managing your Medications? N N  Managing your Finances? N N  Housekeeping or managing your Housekeeping? [name]    Fall/Depression Screening: PHQ 2/9 Scores 12/11/2015 11/18/2015 11/10/2015 10/29/2015 04/22/2014 09/14/2012  PHQ - 2 Score 0 0 0 - 0 0  Exception Documentation - Patient refusal - Patient refusal - -    Assessment:  Reviewed medication list in Epic with patient and updated the medication list.    Medications that were updated include:  -Furosemide---reports she is using as needed -metoprolol succinate 25 mg---reports she is using 1/2 tab daily -mycophenolate mofetil--reports she is using 500 mg tabs and taking 2 tabs in the morning and 2 tabs in the  evening -prednisone--reports she is taking only 5 mg daily   Inhalers:   She reports she is not presently using either Symbicort or Advair and reports these come from [facility].  She asked which on is better---advised patient both medications are similar, and it may be which medication is covered by her insurance---based on the Part D plan she believes she has, Symbicort may be Tier 3 and Advair may be Tier 4.    Sucralfate cost: Patient reports doctor changed back to tablets.  Discussed liquid is brand  name only, and appears to be Tier 4 on her Part D plan, with a co-insurance associated rather than co-pay.  If she needs liquid patient and prescriber could attempt to get a tier exception from the Part D plan.    Plan:  Patient denied any further pharmacy needs at this time as she reports she understands her medications and the affordability issue for sucralfate has been taken care of.    Patient states she will follow-up with her pulmonologist at [facility] regarding her inhalers.   Epic medication list was updated based on patient report of medications she takes.  Will send note to PCP and alert via inbasket Gean Maidens, RN Health Coach of pharmacy sign off at this time.    Tommye Standard, PharmD, [facility] Clinical Pharmacist [facility] [phone]

## 2015-12-16 NOTE — Patient Instructions (Signed)

## 2015-12-17 ENCOUNTER — Telehealth: Payer: Self-pay | Admitting: *Deleted

## 2015-12-17 NOTE — Progress Notes (Signed)
Subjective:  Patient ID: Diana [name], female    DOB: [birth year]  Age: 81 y.o. MRN: [id number]  CC: Back Pain  Procedure: Right sided L2-3 L3-4 or L4-5 and L5-S1 and S1 medial nerve branch lock under fluoroscopic guidance with moderate sedation  HPI Diana [name] presents for reevaluation. She was last seen at the beginning of the month at which point she had her second epidural steroid injection. She failed to gain any significant improvement in her primary pain resides in the right lower back at this point. It is severe to the point she is in tears describing pain today. It is worse with prolonged standing and lateral rotation and medication management has failed to give her any significant relief. She states that she is desperate and desiring to increase her medication management. Otherwise she denies change in lower extremity strength or function or bowel bladder function.  Outpatient Prescriptions Prior to Visit  Medication Sig Dispense Refill  . acetaminophen (TYLENOL) 325 MG tablet Take 650 mg by mouth every 4 (four) hours as needed for mild pain or fever.    . ALPRAZolam (XANAX) 0.25 MG tablet Take 1 tablet (0.25 mg total) by mouth at bedtime as needed for anxiety. (Patient not taking: Reported on 12/16/2015) 20 tablet 0  . azelastine (ASTELIN) 0.1 % nasal spray Place 1 spray into both nostrils 2 (two) times daily. Reported on 12/16/2015    . budesonide-formoterol (SYMBICORT) 160-4.5 MCG/ACT inhaler Inhale 2 puffs into the lungs 2 (two) times daily. Reported on 12/16/2015    . cholecalciferol (VITAMIN D) 400 UNITS TABS tablet Take 400 Units by mouth.    . cyanocobalamin (,VITAMIN B-12,) 1000 MCG/ML injection Inject 1 mL (1,000 mcg total) into the muscle every 30 (thirty) days. Pt uses on the 1st of every month. 10 mL 1  . Fluticasone-Salmeterol (ADVAIR) 250-50 MCG/DOSE AEPB Inhale 1 puff into the lungs 2 (two) times daily. Reported on 12/16/2015    . furosemide (LASIX) 20 MG tablet Take  20 mg by mouth daily as needed.     Marland Kitchen letrozole (FEMARA) 2.5 MG tablet Take 2.5 mg by mouth daily.    Marland Kitchen levocetirizine (XYZAL) 5 MG tablet Take 5 mg by mouth daily.    Marland Kitchen loperamide (IMODIUM) 2 MG capsule Take 2-4 mg by mouth as needed for diarrhea or loose stools.    . metoCLOPramide (REGLAN) 10 MG tablet Take 1 tablet (10 mg total) by mouth 4 (four) times daily -  before meals and at bedtime. 120 tablet 5  . metoprolol succinate (TOPROL-XL) 25 MG 24 hr tablet Take 25 mg by mouth daily.    . Nutritional Supplements (FEEDING SUPPLEMENT, JEVITY 1.5 CAL/FIBER,) LIQD Place 1,000 mLs into feeding tube continuous. 30000 mL 1  . ondansetron (ZOFRAN-ODT) 4 MG disintegrating tablet Take 1 tablet (4 mg total) by mouth every 4 (four) hours as needed for nausea or vomiting. 60 tablet 3  . oxyCODONE-acetaminophen (PERCOCET/ROXICET) 5-325 MG tablet Take 1 tablet by mouth daily as needed for severe pain. 30 tablet 0  . pantoprazole sodium (PROTONIX) 40 mg/20 mL PACK Place 40 mg into feeding tube 2 (two) times daily.    . predniSONE (DELTASONE) 5 MG tablet Take 5 mg by mouth daily.     . promethazine (PHENERGAN) 25 MG tablet Take 6.25-12.5 mg by mouth every 4 (four) hours as needed for nausea or vomiting. Reported on 10/29/2015    . sucralfate (CARAFATE) 1 G tablet Take 1 tablet (1 g total) by  mouth 4 (four) times daily -  with meals and at bedtime. 120 tablet 0  . SYNTHROID 150 MCG tablet Take 1 tablet (150 mcg total) by mouth daily. 30 tablet 11  . Tadalafil, PAH, (ADCIRCA) 20 MG TABS Take 40 mg by mouth daily.    . Water For Irrigation, Sterile (FREE WATER) SOLN Place 150 mLs into feeding tube 2 (two) times daily. 1500 mL 2  . mycophenolate (CELLCEPT) 250 MG capsule Take 1,000 mg by mouth 2 (two) times daily.    . chlorpheniramine-HYDROcodone (TUSSIONEX PENNKINETIC ER) 10-8 MG/5ML SUER Take 5 mLs by mouth at bedtime as needed for cough. (Patient not taking: Reported on 12/11/2015) 140 mL 0  . hydroxychloroquine  (PLAQUENIL) 200 MG tablet Take 200 mg by mouth daily.    Marland Kitchen levofloxacin (LEVAQUIN) 500 MG tablet Take 1 tablet (500 mg total) by mouth daily. (Patient not taking: Reported on 10/20/2015) 7 tablet 0  . ranitidine (ZANTAC) 150 MG tablet TAKE 1 TABLET (150 MG TOTAL) BY MOUTH AT BEDTIME. (Patient not taking: Reported on 12/11/2015) 90 tablet 2   Facility-Administered Medications Prior to Visit  Medication Dose Route Frequency Provider Last Rate Last Dose  . iohexol (OMNIPAQUE) 180 MG/ML injection 20 mL  20 mL Other Once PRN Yevette Edwards, MD      . lidocaine (PF) (XYLOCAINE) 1 % injection 5 mL  5 mL Subcutaneous Once Yevette Edwards, MD      . midazolam (VERSED) 5 MG/5ML injection 5 mg  5 mg Intravenous Once Yevette Edwards, MD      . ropivacaine (PF) 2 mg/ml (0.2%) (NAROPIN) epidural 10 mL  10 mL Epidural Once Yevette Edwards, MD      . sodium chloride flush (NS) 0.9 % injection 10 mL  10 mL Other Once Yevette Edwards, MD      . triamcinolone acetonide (KENALOG-40) injection 40 mg  40 mg Other Once Yevette Edwards, MD       ROS Review of Systems  Objective:  BP 118/65 mmHg  Pulse 80  Temp(Src) 98.5 F (36.9 C) (Oral)  Resp 16  Ht 5\' 3"  (1.6 m)  Wt 116 lb (52.617 kg)  BMI 20.55 kg/m2  SpO2 97%   BP Readings from Last 3 Encounters:  12/16/15 118/65  11/18/15 116/67  10/29/15 112/60     Wt Readings from Last 3 Encounters:  12/16/15 116 lb (52.617 kg)  12/11/15 115 lb (52.164 kg)  11/18/15 120 lb (54.432 kg)     Physical Exam  PERRL EOMI HEART IS RRR  LCTA MUSCULOSKELETAL her straight leg raise is negative. She does have significant pain with extension and right lateral rotation. This is greater than with left lateral rotation. No overt trigger points are noted  Labs  Lab Results  Component Value Date   HGBA1C 5.9 07/03/2015   HGBA1C 7.1* 11/19/2014   HGBA1C 6.9* 04/22/2014   Lab Results  Component Value Date   MICROALBUR <0.7 11/28/2014   LDLCALC 64 10/11/2013   CREATININE  1.22* 09/29/2015    -------------------------------------------------------------------------------------------------------------------- Lab Results  Component Value Date   WBC 8.0 10/20/2015   HGB 9.9* 10/20/2015   HCT 30.6* 10/20/2015   PLT 299 10/20/2015   GLUCOSE 119* 09/29/2015   CHOL 164 10/11/2013   TRIG 152.0* 10/11/2013   HDL 69.70 10/11/2013   LDLDIRECT 28.0 07/03/2015   LDLCALC 64 10/11/2013   ALT 10 09/29/2015   AST 19 09/29/2015   NA 141 09/29/2015   K  4.3 09/29/2015   CL 102 09/29/2015   CREATININE 1.22* 09/29/2015   BUN 23 09/29/2015   CO2 29 09/29/2015   TSH 2.916 04/01/2015   INR 1.22 06/10/2015   HGBA1C 5.9 07/03/2015   MICROALBUR <0.7 11/28/2014    --------------------------------------------------------------------------------------------------------------------- Mm Diag Breast Tomo Bilateral  09/24/2015  CLINICAL DATA:  81 year old female presenting for routine postlumpectomy follow-up. The patient has had a left breast lumpectomy in 2015 followed by radiation therapy. EXAM: DIGITAL DIAGNOSTIC BILATERAL MAMMOGRAM WITH 3D TOMOSYNTHESIS AND CAD COMPARISON:  Previous exam(s). ACR Breast Density Category d: The breast tissue is extremely dense, which lowers the sensitivity of mammography. FINDINGS: There is a marked increase in apparent density of the breast bilaterally, due to marked weight loss in the since the prior mammogram. Interval changes in the upper-outer quadrant of the left breast, consistent with postlumpectomy changes. No suspicious calcifications, masses or areas of distortion are seen in the bilateral breasts. Mammographic images were processed with CAD. IMPRESSION: 1. Interval left breast lumpectomy, without evidence of disease recurrence. 2.  No mammographic evidence of malignancy in the bilateral breasts. 3. Marked weight loss causing increased density of the bilateral breasts. RECOMMENDATION: Diagnostic mammogram is suggested in 1 year.  (Code:DM-B-01Y) I have discussed the findings and recommendations with the patient. Results were also provided in writing at the conclusion of the visit. If applicable, a reminder letter will be sent to the patient regarding the next appointment. BI-RADS CATEGORY  2: Benign. Electronically Signed   By: Frederico Hamman M.D.   On: 09/24/2015 13:56     Assessment & Plan:   Gael was seen today for back pain.  Diagnoses and all orders for this visit:  Facet arthritis of lumbosacral region -     triamcinolone acetonide (KENALOG-40) injection 40 mg; 1 mL (40 mg total) by Other route once. -     ropivacaine (PF) 2 mg/ml (0.2%) (NAROPIN) epidural 10 mL; 10 mLs by Epidural route once. -     midazolam (VERSED) 5 MG/5ML injection 5 mg; Inject 5 mLs (5 mg total) into the vein once. -     lidocaine (PF) (XYLOCAINE) 1 % injection 5 mL; Inject 5 mLs into the skin once. -     lactated ringers infusion 1,000 mL; Inject 1,000 mLs into the vein continuous. -     LUMBAR FACET(MEDIAL BRANCH NERVE BLOCK) MBNB; Future  DDD (degenerative disc disease), lumbar  Spinal stenosis of lumbar region  Sciatica associated with disorder of lumbar spine, right  Other orders -     ropivacaine (PF) 2 mg/ml (0.2%) (NAROPIN) 2 MG/ML epidural;  -     lidocaine (PF) (XYLOCAINE) 1 % injection;  -     fentaNYL (SUBLIMAZE) 100 MCG/2ML injection;  -     triamcinolone acetonide (KENALOG-40) 40 MG/ML injection;  -     midazolam (VERSED) 5 MG/5ML injection;         ----------------------------------------------------------------------------------------------------------------------  Problem List Items Addressed This Visit      Musculoskeletal and Integument   Facet arthritis of lumbosacral region - Primary   Relevant Medications   triamcinolone acetonide (KENALOG-40) injection 40 mg (Completed)   ropivacaine (PF) 2 mg/ml (0.2%) (NAROPIN) epidural 10 mL (Completed)   midazolam (VERSED) 5 MG/5ML injection 5 mg  (Completed)   lidocaine (PF) (XYLOCAINE) 1 % injection 5 mL (Completed)   lactated ringers infusion 1,000 mL   fentaNYL (SUBLIMAZE) 100 MCG/2ML injection (Completed)   Other Relevant Orders   LUMBAR FACET(MEDIAL BRANCH NERVE BLOCK) MBNB  Other Visit Diagnoses    DDD (degenerative disc disease), lumbar        Relevant Medications    triamcinolone acetonide (KENALOG-40) injection 40 mg (Completed)    fentaNYL (SUBLIMAZE) 100 MCG/2ML injection (Completed)    Spinal stenosis of lumbar region        Sciatica associated with disorder of lumbar spine, right        Relevant Medications    midazolam (VERSED) 5 MG/5ML injection 5 mg (Completed)         ----------------------------------------------------------------------------------------------------------------------  1.  lumbar facet arthropathy and DDD (degenerative disc disease), lumbar We'll plan on a facet injection right side for diagnostic and therapeutic purpose today. The risks and benefits of the procedure have been described in full detail. We'll have her return to clinic in 1 month for reevaluation and possible repeat injection at that time   2. Spinal stenosis of lumbar region   3. Sciatica associated with disorder of lumbar spine, right     ----------------------------------------------------------------------------------------------------------------------  I am having Ms. Ohlsen maintain her SYNTHROID, hydroxychloroquine, letrozole, Tadalafil (PAH), acetaminophen, Fluticasone-Salmeterol, loperamide, azelastine, levocetirizine, promethazine, predniSONE, pantoprazole sodium, feeding supplement (JEVITY 1.5 CAL/FIBER), ALPRAZolam, free water, ondansetron, sucralfate, cholecalciferol, furosemide, metoprolol succinate, budesonide-formoterol, levofloxacin, chlorpheniramine-HYDROcodone, metoCLOPramide, cyanocobalamin, ranitidine, and oxyCODONE-acetaminophen. We administered triamcinolone acetonide, ropivacaine (PF) 2 mg/ml  (0.2%), midazolam, lidocaine (PF), ropivacaine (PF) 2 mg/ml (0.2%), lidocaine (PF), fentaNYL, triamcinolone acetonide, and midazolam. We will continue to administer lidocaine (PF), midazolam, ropivacaine (PF) 2 mg/ml (0.2%), sodium chloride flush, triamcinolone acetonide, iohexol, and lactated ringers.   Meds ordered this encounter  Medications  . triamcinolone acetonide (KENALOG-40) injection 40 mg    Sig:   . ropivacaine (PF) 2 mg/ml (0.2%) (NAROPIN) epidural 10 mL    Sig:   . midazolam (VERSED) 5 MG/5ML injection 5 mg    Sig:   . lidocaine (PF) (XYLOCAINE) 1 % injection 5 mL    Sig:   . lactated ringers infusion 1,000 mL    Sig:   . ropivacaine (PF) 2 mg/ml (0.2%) (NAROPIN) 2 MG/ML epidural    Sig:     Hensley, Norma: cabinet override  . lidocaine (PF) (XYLOCAINE) 1 % injection    Sig:     Hensley, Norma: cabinet override  . fentaNYL (SUBLIMAZE) 100 MCG/2ML injection    Sig:     Hensley, Norma: cabinet override  . triamcinolone acetonide (KENALOG-40) 40 MG/ML injection    Sig:     Hensley, Norma: cabinet override  . midazolam (VERSED) 5 MG/5ML injection    Sig:     Hensley, Norma: cabinet override   Patient's Medications  New Prescriptions   No medications on file  Previous Medications   ACETAMINOPHEN (TYLENOL) 325 MG TABLET    Take 650 mg by mouth every 4 (four) hours as needed for mild pain or fever.   ALPRAZOLAM (XANAX) 0.25 MG TABLET    Take 1 tablet (0.25 mg total) by mouth at bedtime as needed for anxiety.   AZELASTINE (ASTELIN) 0.1 % NASAL SPRAY    Place 1 spray into both nostrils 2 (two) times daily. Reported on 12/16/2015   BUDESONIDE-FORMOTEROL (SYMBICORT) 160-4.5 MCG/ACT INHALER    Inhale 2 puffs into the lungs 2 (two) times daily. Reported on 12/16/2015   CHLORPHENIRAMINE-HYDROCODONE (TUSSIONEX PENNKINETIC ER) 10-8 MG/5ML SUER    Take 5 mLs by mouth at bedtime as needed for cough.   CHOLECALCIFEROL (VITAMIN D) 400 UNITS TABS TABLET    Take 400 Units by mouth.    CYANOCOBALAMIN (,VITAMIN  B-12,) 1000 MCG/ML INJECTION    Inject 1 mL (1,000 mcg total) into the muscle every 30 (thirty) days. Pt uses on the 1st of every month.   FLUTICASONE-SALMETEROL (ADVAIR) 250-50 MCG/DOSE AEPB    Inhale 1 puff into the lungs 2 (two) times daily. Reported on 12/16/2015   FUROSEMIDE (LASIX) 20 MG TABLET    Take 20 mg by mouth daily as needed.    HYDROXYCHLOROQUINE (PLAQUENIL) 200 MG TABLET    Take 200 mg by mouth daily.   LETROZOLE (FEMARA) 2.5 MG TABLET    Take 2.5 mg by mouth daily.   LEVOCETIRIZINE (XYZAL) 5 MG TABLET    Take 5 mg by mouth daily.   LEVOFLOXACIN (LEVAQUIN) 500 MG TABLET    Take 1 tablet (500 mg total) by mouth daily.   LOPERAMIDE (IMODIUM) 2 MG CAPSULE    Take 2-4 mg by mouth as needed for diarrhea or loose stools.   METOCLOPRAMIDE (REGLAN) 10 MG TABLET    Take 1 tablet (10 mg total) by mouth 4 (four) times daily -  before meals and at bedtime.   METOPROLOL SUCCINATE (TOPROL-XL) 25 MG 24 HR TABLET    Take 25 mg by mouth daily.   MYCOPHENOLATE (CELLCEPT) 500 MG TABLET    Take 1,000 mg by mouth 2 (two) times daily.   NUTRITIONAL SUPPLEMENTS (FEEDING SUPPLEMENT, JEVITY 1.5 CAL/FIBER,) LIQD    Place 1,000 mLs into feeding tube continuous.   ONDANSETRON (ZOFRAN-ODT) 4 MG DISINTEGRATING TABLET    Take 1 tablet (4 mg total) by mouth every 4 (four) hours as needed for nausea or vomiting.   OXYCODONE-ACETAMINOPHEN (PERCOCET/ROXICET) 5-325 MG TABLET    Take 1 tablet by mouth daily as needed for severe pain.   PANTOPRAZOLE SODIUM (PROTONIX) 40 MG/20 ML PACK    Place 40 mg into feeding tube 2 (two) times daily.   PREDNISONE (DELTASONE) 5 MG TABLET    Take 5 mg by mouth daily.    PROMETHAZINE (PHENERGAN) 25 MG TABLET    Take 6.25-12.5 mg by mouth every 4 (four) hours as needed for nausea or vomiting. Reported on 10/29/2015   RANITIDINE (ZANTAC) 150 MG TABLET    TAKE 1 TABLET (150 MG TOTAL) BY MOUTH AT BEDTIME.   SUCRALFATE (CARAFATE) 1 G TABLET    Take 1 tablet (1 g total)  by mouth 4 (four) times daily -  with meals and at bedtime.   SYNTHROID 150 MCG TABLET    Take 1 tablet (150 mcg total) by mouth daily.   TADALAFIL, PAH, (ADCIRCA) 20 MG TABS    Take 40 mg by mouth daily.   WATER FOR IRRIGATION, STERILE (FREE WATER) SOLN    Place 150 mLs into feeding tube 2 (two) times daily.  Modified Medications   No medications on file  Discontinued Medications   No medications on file   ----------------------------------------------------------------------------------------------------------------------  Follow-up: Return in about 6 weeks (around 01/27/2016) for procedure.   Procedure:    Patient was taken to the fluoroscopy suite and placed in prone position. A total dose of 2 mg of Versed with half cc of fentanyl were titrated for moderate sedation. Vital signs are stable throughout the procedure. The area overlying the aforementioned facets were prepped with Betadine 3 and strict aseptic technique was utilized throughout the procedure. I Identified the areas overlying the aforementioned right facets at L2-3 L3-4 or L4-5 L5-S1. 1% lidocaine 1 cc was infiltrated subcutaneous and into the fascia with a 25-gauge needle at each of these sites.  I then advanced a 22-gauge 3-1/2 inch Quinckie needle with the needle tip to lie at the [facility] portion of the [employer] dog". There was negative aspiration for heme or CSF and  no paresthesia. I then injected 2 cc of ropivacaine 0.2% mixed with 8 mg of triamcinolone at each of the aforementioned sites. These needles were withdrawn and the L5-S1 needle was redirected towards the right S1 posterior foramen. This was once again no paresthesia, negative aspiration and 2 cc of this same mixture was injected at this site. The patient was convalesced discharged home stable condition for follow-up as mentioned. Yevette Edwards, MD

## 2015-12-17 NOTE — Telephone Encounter (Signed)
Spoke with patient re; procedure on yesterday, verbalizes no concerns or complications.

## 2015-12-24 ENCOUNTER — Other Ambulatory Visit: Payer: Self-pay | Admitting: Oncology

## 2015-12-24 ENCOUNTER — Telehealth: Payer: Self-pay | Admitting: *Deleted

## 2015-12-24 ENCOUNTER — Telehealth: Payer: Self-pay | Admitting: Anesthesiology

## 2015-12-24 NOTE — Telephone Encounter (Signed)
Not feeling any better since procedure, dr Andree Elk told her to call back in a week and let him know

## 2015-12-24 NOTE — Telephone Encounter (Signed)
Patient states she had no improvement from procedure and was calling per Dr Andree Elk instructions to let him know.  When I called Dr Andree Elk he said we could possibly move her up if there are any available appt times as she is already scheduled for facets on 01/25/16.  There are currently no earlier appts available, Juliann Pulse will call if anything becomes available.

## 2015-12-29 ENCOUNTER — Other Ambulatory Visit: Payer: Self-pay | Admitting: Internal Medicine

## 2015-12-29 DIAGNOSIS — R0609 Other forms of dyspnea: Secondary | ICD-10-CM | POA: Diagnosis not present

## 2015-12-29 DIAGNOSIS — I5030 Unspecified diastolic (congestive) heart failure: Secondary | ICD-10-CM | POA: Diagnosis not present

## 2015-12-29 DIAGNOSIS — I272 Other secondary pulmonary hypertension: Secondary | ICD-10-CM | POA: Diagnosis not present

## 2015-12-29 MED ORDER — OXYCODONE-ACETAMINOPHEN 5-325 MG PO TABS: 1.0000 | ORAL_TABLET | Freq: Every day | ORAL | Status: DC | PRN

## 2015-12-29 NOTE — Telephone Encounter (Signed)
Las refilled on 11/17/15, please advise refill, thanks

## 2015-12-29 NOTE — Telephone Encounter (Signed)
Notified patient script ready for pick up and placed at front desk.

## 2015-12-29 NOTE — Telephone Encounter (Signed)
Pt called needing a refill on oxyCODONE-acetaminophen (PERCOCET/ROXICET) 5-325 MG.. Please advise pt when ready

## 2015-12-29 NOTE — Telephone Encounter (Signed)
refilled 

## 2015-12-31 ENCOUNTER — Telehealth: Payer: Self-pay | Admitting: *Deleted

## 2015-12-31 DIAGNOSIS — C50412 Malignant neoplasm of upper-outer quadrant of left female breast: Secondary | ICD-10-CM

## 2015-12-31 MED ORDER — LETROZOLE 2.5 MG PO TABS: 2.5000 mg | ORAL_TABLET | Freq: Every day | ORAL | Status: DC

## 2015-12-31 NOTE — Telephone Encounter (Signed)
Pt called to request an appt with Dr. Oliva Bustard and needs a refill for femara. appt will be made by scheduling and pt will be notified by scheduling. Pt informed that refill for femara has been sent to [facility]. Pt verbalized understanding.

## 2016-01-05 ENCOUNTER — Ambulatory Visit: Payer: Self-pay | Admitting: *Deleted

## 2016-01-06 ENCOUNTER — Other Ambulatory Visit: Payer: Self-pay | Admitting: *Deleted

## 2016-01-06 ENCOUNTER — Encounter: Payer: Self-pay | Admitting: *Deleted

## 2016-01-06 NOTE — Patient Outreach (Signed)
[facility]) Care Management  [facility] Care Manager  01/06/2016   Diana [name] [birth year] [id number]  Subjective: RN Health Coach telephone call to patient.  [employer] compliance verified. Per patient she is weighing 118 today. When initially asked patient about weight she  gave a range of 115-118 then stated she was weighed 118 yesterday and today. RN Health Coach asked pain which zone the green or yellow. Patient need reiterating on zones. Patient is in the green zone . Per patient she has no swelling in extremities or shortness of breath.   Per patient reported that she has not had any falls. Patient is having back pain with no radiation or numbness.  Per patient she is taking one pain pill because taking two makes her sleep all day. RN Health Coach talked with patient about asking Dr for some possible pain  Analgesics since she won't take the pills and look at alternating heat and ice.  Patient is on a liquid diet unable to eat solid foods.   Objective:   Encounter Medications:  Outpatient Encounter Prescriptions as of 01/06/2016  Medication Sig Note  . acetaminophen (TYLENOL) 325 MG tablet Take 650 mg by mouth every 4 (four) hours as needed for mild pain or fever.   . ALPRAZolam (XANAX) 0.25 MG tablet Take 1 tablet (0.25 mg total) by mouth at bedtime as needed for anxiety. (Patient not taking: Reported on 12/16/2015) 12/16/2015: Patient reports she has not used in the past week.    Marland Kitchen azelastine (ASTELIN) 0.1 % nasal spray Place 1 spray into both nostrils 2 (two) times daily. Reported on 12/16/2015 12/16/2015: Patient reports she is not using  . budesonide-formoterol (SYMBICORT) 160-4.5 MCG/ACT inhaler Inhale 2 puffs into the lungs 2 (two) times daily. Reported on 12/16/2015 12/16/2015: Patient reports rx written by [facility] and out of inhaler.  . chlorpheniramine-HYDROcodone (TUSSIONEX PENNKINETIC ER) 10-8 MG/5ML SUER Take 5 mLs by mouth at bedtime as needed for cough. (Patient not  taking: Reported on 12/11/2015)   . cholecalciferol (VITAMIN D) 400 UNITS TABS tablet Take 400 Units by mouth.   . cyanocobalamin (,VITAMIN B-12,) 1000 MCG/ML injection Inject 1 mL (1,000 mcg total) into the muscle every 30 (thirty) days. Pt uses on the 1st of every month.   . Fluticasone-Salmeterol (ADVAIR) 250-50 MCG/DOSE AEPB Inhale 1 puff into the lungs 2 (two) times daily. Reported on 12/16/2015 12/16/2015: Patient reports rx came from [facility] and out of inhaler.    . furosemide (LASIX) 20 MG tablet Take 20 mg by mouth daily as needed.  12/16/2015: Patient reports uses prn.    . hydroxychloroquine (PLAQUENIL) 200 MG tablet Take 200 mg by mouth daily. 12/16/2015: Patient has stopped taking for procedure.  Today 12/16/15 is the 9th day that she has not taken this medication  . letrozole (FEMARA) 2.5 MG tablet Take 1 tablet (2.5 mg total) by mouth daily.   Marland Kitchen levocetirizine (XYZAL) 5 MG tablet Take 5 mg by mouth daily.   Marland Kitchen levofloxacin (LEVAQUIN) 500 MG tablet Take 1 tablet (500 mg total) by mouth daily. (Patient not taking: Reported on 10/20/2015)   . loperamide (IMODIUM) 2 MG capsule Take 2-4 mg by mouth as needed for diarrhea or loose stools.   . metoCLOPramide (REGLAN) 10 MG tablet Take 1 tablet (10 mg total) by mouth 4 (four) times daily -  before meals and at bedtime.   . metoprolol succinate (TOPROL-XL) 25 MG 24 hr tablet Take 25 mg by mouth daily. 12/16/2015: Patient reports she  takes 1/2 tab daily.    . mycophenolate (CELLCEPT) 500 MG tablet Take 1,000 mg by mouth 2 (two) times daily.   . Nutritional Supplements (FEEDING SUPPLEMENT, JEVITY 1.5 CAL/FIBER,) LIQD Place 1,000 mLs into feeding tube continuous.   . ondansetron (ZOFRAN-ODT) 4 MG disintegrating tablet Take 1 tablet (4 mg total) by mouth every 4 (four) hours as needed for nausea or vomiting.   Marland Kitchen oxyCODONE-acetaminophen (PERCOCET/ROXICET) 5-325 MG tablet Take 1 tablet by mouth daily as needed for severe pain.   . pantoprazole sodium  (PROTONIX) 40 mg/20 mL PACK Place 40 mg into feeding tube 2 (two) times daily.   . predniSONE (DELTASONE) 5 MG tablet Take 5 mg by mouth daily.  12/16/2015: Patient reports now on 5 mg daily  . promethazine (PHENERGAN) 25 MG tablet Take 6.25-12.5 mg by mouth every 4 (four) hours as needed for nausea or vomiting. Reported on 10/29/2015   . ranitidine (ZANTAC) 150 MG tablet TAKE 1 TABLET (150 MG TOTAL) BY MOUTH AT BEDTIME. (Patient not taking: Reported on 12/11/2015)   . sucralfate (CARAFATE) 1 G tablet Take 1 tablet (1 g total) by mouth 4 (four) times daily -  with meals and at bedtime.   Marland Kitchen SYNTHROID 150 MCG tablet Take 1 tablet (150 mcg total) by mouth daily.   . Tadalafil, PAH, (ADCIRCA) 20 MG TABS Take 40 mg by mouth daily.   . Water For Irrigation, Sterile (FREE WATER) SOLN Place 150 mLs into feeding tube 2 (two) times daily.    Facility-Administered Encounter Medications as of 01/06/2016  Medication  . iohexol (OMNIPAQUE) 180 MG/ML injection 20 mL  . lactated ringers infusion 1,000 mL  . lidocaine (PF) (XYLOCAINE) 1 % injection 5 mL  . midazolam (VERSED) 5 MG/5ML injection 5 mg  . ropivacaine (PF) 2 mg/ml (0.2%) (NAROPIN) epidural 10 mL  . sodium chloride flush (NS) 0.9 % injection 10 mL  . triamcinolone acetonide (KENALOG-40) injection 40 mg    Functional Status:  In your present state of health, do you have any difficulty performing the following activities: 01/06/2016 12/11/2015  Hearing? - N  Vision? - N  Difficulty concentrating or making decisions? - N  Walking or climbing stairs? - Y  Dressing or bathing? - N  Doing errands, shopping? - Y  Preparing Food and eating ? N N  Using the Toilet? N N  In the past six months, have you accidently leaked urine? N N  Do you have problems with loss of bowel control? N N  Managing your Medications? N N  Managing your Finances? N N  Housekeeping or managing your Housekeeping? [name]    Fall/Depression Screening: PHQ 2/9 Scores 01/06/2016  12/11/2015 11/18/2015 11/10/2015 10/29/2015 04/22/2014 09/14/2012  PHQ - 2 Score 0 0 0 0 - 0 0  Exception Documentation - - Patient refusal - Patient refusal - -   THN CM Care Plan Problem One        Most Recent Value   Care Plan Problem One  Knowledge deficit in self management of congestive heart falure   Role Documenting the Problem One  Health Coach   Care Plan for Problem One  Active   THN Long Term Goal (31-90 days)  Patient will not have any admissions for CHF within the next 90 days   THN Long Term Goal Start Date  01/06/16 [continue]   Interventions for Problem One Long Term Goal  RN reminded patient to keep appointments with PCP and other physician that patient is  seeing. Patient reminded the importance of taking medications as per order. RN Health Coach will monitor patient monthly telephonic.   THN CM Short Term Goal #1 (0-30 days)  Patient will be able to verbalize 3 signs ans symptoms of CHF within the next 30 days   THN CM Short Term Goal #1 Start Date  -- [continue. Patient could tell the signs and symptoms but needed prompting]   Interventions for Short Term Goal #1  RN will send educational material on Congestive heart failure zones. RN will send a large magnet for CHF to place on refrigerator. RN will follow up with discussion and teachback.   THN CM Short Term Goal #2 (0-30 days)  Patient will weigh daily and record in log within the next 30 days   THN CM Short Term Goal #2 Start Date  01/06/16   Interventions for Short Term Goal #2  RN Health Coach  will send patient a calendar book for documentation.  RN discussed the importance of weighing same time each day.RN discussed the importance of weighing and recording any weight gain of 3 pounds in a day or 5 pounds in a week.  Patient is to notify doctor. [continue. Patient is weighing but gave a range of 115-118 and today and yesterday 118]      Assessment:  Patient will continue to  benefit from Health Coach telephonic outreach for  education and support for diabetes self management. Patient is on tube feedings and does not eat regular meals Patient needs reiterating CHF zones with outreach calls.  Plan: RN sent educational material on peripheral edema RN sent educational material on shortness of breath RN Health Coach will need to reiterate zones with each follow up outreach call RN will follow up within a month for teach back and discussion  Gean Maidens BSN RN [facility] Care Management [phone]

## 2016-01-07 DIAGNOSIS — D500 Iron deficiency anemia secondary to blood loss (chronic): Secondary | ICD-10-CM | POA: Diagnosis not present

## 2016-01-08 ENCOUNTER — Ambulatory Visit: Payer: Self-pay | Admitting: *Deleted

## 2016-01-19 ENCOUNTER — Encounter: Payer: Medicare Other | Attending: Gastroenterology | Admitting: Dietician

## 2016-01-19 DIAGNOSIS — E039 Hypothyroidism, unspecified: Secondary | ICD-10-CM | POA: Diagnosis not present

## 2016-01-19 DIAGNOSIS — E119 Type 2 diabetes mellitus without complications: Secondary | ICD-10-CM | POA: Diagnosis not present

## 2016-01-19 NOTE — Patient Instructions (Signed)
Increase Jevity 1.5 to 3 1/2 cans per day. Rate would need to increase to 26ml/hr from 8pm to 10am.  Tube feeding can be tried during the day as well vs at night.  Drink at 2 premier protein shakes (2pm and 4pm).  Increase water consumption to keep hydrated.

## 2016-01-19 NOTE — Progress Notes (Unsigned)
Medical Nutrition Therapy: Visit start time: 2:30pm  end time: 3:05pm  Assessment:  Diagnosis: Abnormal wt loss and renal insufficency Past medical history: Pt referred by Dr Gustavo Lah, GI for abnormal wt loss and renal insufficency.  Pt also on PEG tube feeding for primary nutrition.  See list for medical history. Psychosocial issues/ stress concerns: Pt reports moderate stress level due health issues. Pt feels she is managing stress ok. Preferred learning method:  . Visual Current weight: 118 pounds  Height: 63 inches Pt reports wt stays between 115-120 pounds. Noted wt 11/18/15 120 pounds, 10/29/15 123 pounds, 09/29/15 125 pounds Medications, supplements: see list Progress and evaluation:  Energy needs: Kcals: [id number] kcals/d Protein: 81-95 g/d Fluid: >/=1.8 L/d Pt reports recently she increased tube feeding of jevity 1.5 61ml/hr from 8pm to 10am or 11am (takes about 3 cans) was at rate of 39ml/hr (received call from RD with ?? [facility] per pt).  Reports feeling ok but some nausea in the am.  Reports regular BM 1 per day but did have bout of constipation but has resolved. Reports that son hooks pt up to tube feeding pump in the evening and flushed tube with water 139ml 2 times per day.  Son gives all medication via PEG with water flush.     Orally pt reports taking 1 premier protein shake and little bit of water plus a homemade milkshake at times.  Is sick of ensure and anything like it but can tolerate premier protein shake.  Reports has tried a milkshake but got nauseated a day after.  Likes almond milk, unable to tolerate yogurt.  MD wanting pt on liquids only at this time.   Physical activity: no physical activity, low back pain and weakness    Nutritional Diagnosis:  Inadequate oral intake related to GI issues as evidenced by sole source nutrition coming from PEG tube feeding  Intervention:  -Recommend increasing Jevity 1.5 to 3 1/2 cans per day at rate of 59-37ml/hr from  8pm-10am.  Will provide 994 kcals and 52 gm of protein and 682ml free water.  Pt agreeable. Ideally, pt needs jevity 1.5 4-5 cans per day to better meet nutritional needs.   Also discussed tube feeding during the day. Pt reports hard to do during the day as can't move the pump around the house freely due to lack of strength and son is not home during the day, also has steps to get up and down with pump. Encouraged pt to try feeding during the day when son is at home (maybe on weekends) and see the difference.  Discussed importance of head of bed being elevated > 30 degrees while tube feeding is infusing. Provided booklet on home tube feeding. -Pt agreeable to increasing premier protein shake from 1 to 2 per day (will provide additional 160 kcals and 30 gm of protein each)   -This Probation officer called Thersa Salt, RD with  [place] after receiving pt's permission as currently supplies tube feeding through agency [facility] reported that she had spoke with pt at the beginning of April about increasing feeding to 3 1/2 cans per day (pt only taking 3 cans per visit report) and drinking premier protein shakes.  RD with [facility] has been following pt monthly with phone call and tweaking tube feeding.  RD reports wt last month of 118 pounds -RD provided full liquid diet information and recipes.   Learner/ who was taught:  . Patient   Level of understanding: Marland Kitchen Verbalizes/  demonstrates understanding Demonstrated degree of understanding via:   Teach back Learning barriers: . None   Monitoring and Evaluation:  Pt to follow-up with [place] RD monthly   Dr. Gustavo Lah to call [place] Nutrition Team at [phone] or fax Nutrition Consult with concerns to [place] at [phone] regarding patient nutrition plan of care.

## 2016-01-21 DIAGNOSIS — Z79899 Other long term (current) drug therapy: Secondary | ICD-10-CM | POA: Diagnosis not present

## 2016-01-21 DIAGNOSIS — H2512 Age-related nuclear cataract, left eye: Secondary | ICD-10-CM | POA: Diagnosis not present

## 2016-01-21 DIAGNOSIS — M329 Systemic lupus erythematosus, unspecified: Secondary | ICD-10-CM | POA: Diagnosis not present

## 2016-01-25 ENCOUNTER — Encounter: Payer: Self-pay | Admitting: Anesthesiology

## 2016-01-25 ENCOUNTER — Ambulatory Visit: Payer: Medicare Other | Attending: Anesthesiology | Admitting: Anesthesiology

## 2016-01-25 ENCOUNTER — Telehealth: Payer: Self-pay | Admitting: Internal Medicine

## 2016-01-25 VITALS — BP 119/68 | HR 78 | Temp 98.8°F | Resp 19 | Ht 63.0 in | Wt 118.0 lb

## 2016-01-25 DIAGNOSIS — M4806 Spinal stenosis, lumbar region: Secondary | ICD-10-CM | POA: Insufficient documentation

## 2016-01-25 DIAGNOSIS — M549 Dorsalgia, unspecified: Secondary | ICD-10-CM | POA: Diagnosis present

## 2016-01-25 DIAGNOSIS — M5136 Other intervertebral disc degeneration, lumbar region: Secondary | ICD-10-CM | POA: Diagnosis not present

## 2016-01-25 DIAGNOSIS — M47817 Spondylosis without myelopathy or radiculopathy, lumbosacral region: Secondary | ICD-10-CM | POA: Diagnosis not present

## 2016-01-25 DIAGNOSIS — M1288 Other specific arthropathies, not elsewhere classified, other specified site: Secondary | ICD-10-CM | POA: Diagnosis not present

## 2016-01-25 DIAGNOSIS — M5441 Lumbago with sciatica, right side: Secondary | ICD-10-CM | POA: Diagnosis not present

## 2016-01-25 MED ORDER — MIDAZOLAM HCL 5 MG/5ML IJ SOLN: 5.0000 mg | Freq: Once | INTRAMUSCULAR | Status: DC

## 2016-01-25 MED ORDER — LACTATED RINGERS IV SOLN: 1000.0000 mL | INTRAVENOUS | Status: DC

## 2016-01-25 MED ORDER — ROPIVACAINE HCL 2 MG/ML IJ SOLN
INTRAMUSCULAR | Status: AC
  Administered 2016-01-25: 15:00:00
  Filled 2016-01-25: qty 10

## 2016-01-25 MED ORDER — TRIAMCINOLONE ACETONIDE 40 MG/ML IJ SUSP
INTRAMUSCULAR | Status: AC
  Administered 2016-01-25: 15:00:00
  Filled 2016-01-25: qty 1

## 2016-01-25 MED ORDER — TRIAMCINOLONE ACETONIDE 40 MG/ML IJ SUSP: 40.0000 mg | Freq: Once | INTRAMUSCULAR | Status: DC

## 2016-01-25 MED ORDER — ROPIVACAINE HCL 2 MG/ML IJ SOLN: 10.0000 mL | Freq: Once | INTRAMUSCULAR | Status: DC

## 2016-01-25 MED ORDER — SODIUM CHLORIDE 0.9% FLUSH: 10.0000 mL | Freq: Once | INTRAVENOUS | Status: DC

## 2016-01-25 MED ORDER — LIDOCAINE HCL (PF) 1 % IJ SOLN
INTRAMUSCULAR | Status: AC
  Administered 2016-01-25: 15:00:00
  Filled 2016-01-25: qty 5

## 2016-01-25 MED ORDER — SODIUM CHLORIDE 0.9 % IJ SOLN
INTRAMUSCULAR | Status: AC
  Filled 2016-01-25: qty 10

## 2016-01-25 MED ORDER — LIDOCAINE HCL (PF) 1 % IJ SOLN: 5.0000 mL | Freq: Once | INTRAMUSCULAR | Status: DC

## 2016-01-25 MED ORDER — FENTANYL CITRATE (PF) 100 MCG/2ML IJ SOLN
INTRAMUSCULAR | Status: AC
  Filled 2016-01-25: qty 2

## 2016-01-25 MED ORDER — MIDAZOLAM HCL 5 MG/5ML IJ SOLN
INTRAMUSCULAR | Status: AC
Start: 2016-01-25 — End: 2016-01-25
  Administered 2016-01-25: 1 mg via INTRAVENOUS
  Filled 2016-01-25: qty 5

## 2016-01-25 NOTE — Telephone Encounter (Signed)
Use the 5 O'clock time

## 2016-01-25 NOTE — Telephone Encounter (Signed)
Pt called and would like to see Dr. Derrel Nip before she has her cataract surgery. When can we get her in? She is willing to come next Monday, May 15 in the evening.

## 2016-01-25 NOTE — Patient Instructions (Signed)
GENERAL RISKS AND COMPLICATIONS  What are the risk, side effects and possible complications? Generally speaking, most procedures are safe.  However, with any procedure there are risks, side effects, and the possibility of complications.  The risks and complications are dependent upon the sites that are lesioned, or the type of nerve block to be performed.  The closer the procedure is to the spine, the more serious the risks are.  Great care is taken when placing the radio frequency needles, block needles or lesioning probes, but sometimes complications can occur. 1. Infection: Any time there is an injection through the skin, there is a risk of infection.  This is why sterile conditions are used for these blocks.  There are four possible types of infection. 1. Localized skin infection. 2. Central Nervous System Infection-This can be in the form of Meningitis, which can be deadly. 3. Epidural Infections-This can be in the form of an epidural abscess, which can cause pressure inside of the spine, causing compression of the spinal cord with subsequent paralysis. This would require an emergency surgery to decompress, and there are no guarantees that the patient would recover from the paralysis. 4. Discitis-This is an infection of the intervertebral discs.  It occurs in about 1% of discography procedures.  It is difficult to treat and it may lead to surgery.        2. Pain: the needles have to go through skin and soft tissues, will cause soreness.       3. Damage to internal structures:  The nerves to be lesioned may be near blood vessels or    other nerves which can be potentially damaged.       4. Bleeding: Bleeding is more common if the patient is taking blood thinners such as  aspirin, Coumadin, Ticiid, Plavix, etc., or if he/she have some genetic predisposition  such as hemophilia. Bleeding into the spinal canal can cause compression of the spinal  cord with subsequent paralysis.  This would require an  emergency surgery to  decompress and there are no guarantees that the patient would recover from the  paralysis.       5. Pneumothorax:  Puncturing of a lung is a possibility, every time a needle is introduced in  the area of the chest or upper back.  Pneumothorax refers to free air around the  collapsed lung(s), inside of the thoracic cavity (chest cavity).  Another two possible  complications related to a similar event would include: Hemothorax and Chylothorax.   These are variations of the Pneumothorax, where instead of air around the collapsed  lung(s), you may have blood or chyle, respectively.       6. Spinal headaches: They may occur with any procedures in the area of the spine.       7. Persistent CSF (Cerebro-Spinal Fluid) leakage: This is a rare problem, but may occur  with prolonged intrathecal or epidural catheters either due to the formation of a fistulous  track or a dural tear.       8. Nerve damage: By working so close to the spinal cord, there is always a possibility of  nerve damage, which could be as serious as a permanent spinal cord injury with  paralysis.       9. Death:  Although rare, severe deadly allergic reactions known as "Anaphylactic  reaction" can occur to any of the medications used.      10. Worsening of the symptoms:  We can always make thing worse.    What are the chances of something like this happening? Chances of any of this occuring are extremely low.  By statistics, you have more of a chance of getting killed in a motor vehicle accident: while driving to the hospital than any of the above occurring .  Nevertheless, you should be aware that they are possibilities.  In general, it is similar to taking a shower.  Everybody knows that you can slip, hit your head and get killed.  Does that mean that you should not shower again?  Nevertheless always keep in mind that statistics do not mean anything if you happen to be on the wrong side of them.  Even if a procedure has a 1  (one) in a 1,000,000 (million) chance of going wrong, it you happen to be that one..Also, keep in mind that by statistics, you have more of a chance of having something go wrong when taking medications.  Who should not have this procedure? If you are on a blood thinning medication (e.g. Coumadin, Plavix, see list of "Blood Thinners"), or if you have an active infection going on, you should not have the procedure.  If you are taking any blood thinners, please inform your physician.  How should I prepare for this procedure?  Do not eat or drink anything at least six hours prior to the procedure.  Bring a driver with you .  It cannot be a taxi.  Come accompanied by an adult that can drive you back, and that is strong enough to help you if your legs get weak or numb from the local anesthetic.  Take all of your medicines the morning of the procedure with just enough water to swallow them.  If you have diabetes, make sure that you are scheduled to have your procedure done first thing in the morning, whenever possible.  If you have diabetes, take only half of your insulin dose and notify our nurse that you have done so as soon as you arrive at the clinic.  If you are diabetic, but only take blood sugar pills (oral hypoglycemic), then do not take them on the morning of your procedure.  You may take them after you have had the procedure.  Do not take aspirin or any aspirin-containing medications, at least eleven (11) days prior to the procedure.  They may prolong bleeding.  Wear loose fitting clothing that may be easy to take off and that you would not mind if it got stained with Betadine or blood.  Do not wear any jewelry or perfume  Remove any nail coloring.  It will interfere with some of our monitoring equipment.  NOTE: Remember that this is not meant to be interpreted as a complete list of all possible complications.  Unforeseen problems may occur.  BLOOD THINNERS The following drugs  contain aspirin or other products, which can cause increased bleeding during surgery and should not be taken for 2 weeks prior to and 1 week after surgery.  If you should need take something for relief of minor pain, you may take acetaminophen which is found in Tylenol,m Datril, Anacin-3 and Panadol. It is not blood thinner. The products listed below are.  Do not take any of the products listed below in addition to any listed on your instruction sheet.  A.P.C or A.P.C with Codeine Codeine Phosphate Capsules #3 Ibuprofen Ridaura  ABC compound Congesprin Imuran rimadil  Advil Cope Indocin Robaxisal  Alka-Seltzer Effervescent Pain Reliever and Antacid Coricidin or Coricidin-D  Indomethacin Rufen    Alka-Seltzer plus Cold Medicine Cosprin Ketoprofen S-A-C Tablets  Anacin Analgesic Tablets or Capsules Coumadin Korlgesic Salflex  Anacin Extra Strength Analgesic tablets or capsules CP-2 Tablets Lanoril Salicylate  Anaprox Cuprimine Capsules Levenox Salocol  Anexsia-D Dalteparin Magan Salsalate  Anodynos Darvon compound Magnesium Salicylate Sine-off  Ansaid Dasin Capsules Magsal Sodium Salicylate  Anturane Depen Capsules Marnal Soma  APF Arthritis pain formula Dewitt's Pills Measurin Stanback  Argesic Dia-Gesic Meclofenamic Sulfinpyrazone  Arthritis Bayer Timed Release Aspirin Diclofenac Meclomen Sulindac  Arthritis pain formula Anacin Dicumarol Medipren Supac  Analgesic (Safety coated) Arthralgen Diffunasal Mefanamic Suprofen  Arthritis Strength Bufferin Dihydrocodeine Mepro Compound Suprol  Arthropan liquid Dopirydamole Methcarbomol with Aspirin Synalgos  ASA tablets/Enseals Disalcid Micrainin Tagament  Ascriptin Doan's Midol Talwin  Ascriptin A/D Dolene Mobidin Tanderil  Ascriptin Extra Strength Dolobid Moblgesic Ticlid  Ascriptin with Codeine Doloprin or Doloprin with Codeine Momentum Tolectin  Asperbuf Duoprin Mono-gesic Trendar  Aspergum Duradyne Motrin or Motrin IB Triminicin  Aspirin  plain, buffered or enteric coated Durasal Myochrisine Trigesic  Aspirin Suppositories Easprin Nalfon Trillsate  Aspirin with Codeine Ecotrin Regular or Extra Strength Naprosyn Uracel  Atromid-S Efficin Naproxen Ursinus  Auranofin Capsules Elmiron Neocylate Vanquish  Axotal Emagrin Norgesic Verin  Azathioprine Empirin or Empirin with Codeine Normiflo Vitamin E  Azolid Emprazil Nuprin Voltaren  Bayer Aspirin plain, buffered or children's or timed BC Tablets or powders Encaprin Orgaran Warfarin Sodium  Buff-a-Comp Enoxaparin Orudis Zorpin  Buff-a-Comp with Codeine Equegesic Os-Cal-Gesic   Buffaprin Excedrin plain, buffered or Extra Strength Oxalid   Bufferin Arthritis Strength Feldene Oxphenbutazone   Bufferin plain or Extra Strength Feldene Capsules Oxycodone with Aspirin   Bufferin with Codeine Fenoprofen Fenoprofen Pabalate or Pabalate-SF   Buffets II Flogesic Panagesic   Buffinol plain or Extra Strength Florinal or Florinal with Codeine Panwarfarin   Buf-Tabs Flurbiprofen Penicillamine   Butalbital Compound Four-way cold tablets Penicillin   Butazolidin Fragmin Pepto-Bismol   Carbenicillin Geminisyn Percodan   Carna Arthritis Reliever Geopen Persantine   Carprofen Gold's salt Persistin   Chloramphenicol Goody's Phenylbutazone   Chloromycetin Haltrain Piroxlcam   Clmetidine heparin Plaquenil   Cllnoril Hyco-pap Ponstel   Clofibrate Hydroxy chloroquine Propoxyphen         Before stopping any of these medications, be sure to consult the physician who ordered them.  Some, such as Coumadin (Warfarin) are ordered to prevent or treat serious conditions such as "deep thrombosis", "pumonary embolisms", and other heart problems.  The amount of time that you may need off of the medication may also vary with the medication and the reason for which you were taking it.  If you are taking any of these medications, please make sure you notify your pain physician before you undergo any  procedures.         Facet Blocks Patient Information  Description: The facets are joints in the spine between the vertebrae.  Like any joints in the body, facets can become irritated and painful.  Arthritis can also effect the facets.  By injecting steroids and local anesthetic in and around these joints, we can temporarily block the nerve supply to them.  Steroids act directly on irritated nerves and tissues to reduce selling and inflammation which often leads to decreased pain.  Facet blocks may be done anywhere along the spine from the neck to the low back depending upon the location of your pain.   After numbing the skin with local anesthetic (like Novocaine), a small needle is passed onto the facet joints under x-ray guidance.    You may experience a sensation of pressure while this is being done.  The entire block usually lasts about 15-25 minutes.   Conditions which may be treated by facet blocks:   Low back/buttock pain  Neck/shoulder pain  Certain types of headaches  Preparation for the injection:  1. Do not eat any solid food or dairy products within 8 hours of your appointment. 2. You may drink clear liquid up to 3 hours before appointment.  Clear liquids include water, black coffee, juice or soda.  No milk or cream please. 3. You may take your regular medication, including pain medications, with a sip of water before your appointment.  Diabetics should hold regular insulin (if taken separately) and take 1/2 normal NPH dose the morning of the procedure.  Carry some sugar containing items with you to your appointment. 4. A driver must accompany you and be prepared to drive you home after your procedure. 5. Bring all your current medications with you. 6. An IV may be inserted and sedation may be given at the discretion of the physician. 7. A blood pressure cuff, EKG and other monitors will often be applied during the procedure.  Some patients may need to have extra oxygen  administered for a short period. 8. You will be asked to provide medical information, including your allergies and medications, prior to the procedure.  We must know immediately if you are taking blood thinners (like Coumadin/Warfarin) or if you are allergic to IV iodine contrast (dye).  We must know if you could possible be pregnant.  Possible side-effects:   Bleeding from needle site  Infection (rare, may require surgery)  Nerve injury (rare)  Numbness & tingling (temporary)  Difficulty urinating (rare, temporary)  Spinal headache (a headache worse with upright posture)  Light-headedness (temporary)  Pain at injection site (serveral days)  Decreased blood pressure (rare, temporary)  Weakness in arm/leg (temporary)  Pressure sensation in back/neck (temporary)   Call if you experience:   Fever/chills associated with headache or increased back/neck pain  Headache worsened by an upright position  New onset, weakness or numbness of an extremity below the injection site  Hives or difficulty breathing (go to the emergency room)  Inflammation or drainage at the injection site(s)  Severe back/neck pain greater than usual  New symptoms which are concerning to you  Please note:  Although the local anesthetic injected can often make your back or neck feel good for several hours after the injection, the pain will likely return. It takes 3-7 days for steroids to work.  You may not notice any pain relief for at least one week.  If effective, we will often do a series of 2-3 injections spaced 3-6 weeks apart to maximally decrease your pain.  After the initial series, you may be a candidate for a more permanent nerve block of the facets.  If you have any questions, please call [phone] [facility] 

## 2016-01-25 NOTE — Progress Notes (Signed)
Safety precautions to be maintained throughout the outpatient stay will include: orient to surroundings, keep bed in low position, maintain call bell within reach at all times, provide assistance with transfer out of bed and ambulation.  

## 2016-01-26 ENCOUNTER — Telehealth: Payer: Self-pay | Admitting: *Deleted

## 2016-01-26 NOTE — Telephone Encounter (Signed)
Message left

## 2016-01-27 NOTE — Progress Notes (Signed)
Subjective:  Patient ID: Diana [name], female    DOB: 1935/01/04  Age: 81 y.o. MRN: [id number]  CC: Back Pain  Procedure: Right sided  L3-4 or L4-5 and L5-S1 and S1 medial nerve branch lock under fluoroscopic guidance with moderate sedation  HPI ALESCIA ROWBOTHAM presents for reevaluation.. At her last visit she had her first facet block and reports a 50% improvement and much better relief with that. She was very pleased and has had some mild recurrence but none of the severe pain that she had been experiencing originally. Furthermore she is using her medications as prescribed and these seem to be well tolerated and assisting with pain control. She is sleeping better and she's been able to be more active.  Outpatient Prescriptions Prior to Visit  Medication Sig Dispense Refill  . acetaminophen (TYLENOL) 325 MG tablet Take 650 mg by mouth every 4 (four) hours as needed for mild pain or fever.    . budesonide-formoterol (SYMBICORT) 160-4.5 MCG/ACT inhaler Inhale 2 puffs into the lungs 2 (two) times daily. Reported on 12/16/2015    . cholecalciferol (VITAMIN D) 400 UNITS TABS tablet Take 400 Units by mouth.    . cyanocobalamin (,VITAMIN B-12,) 1000 MCG/ML injection Inject 1 mL (1,000 mcg total) into the muscle every 30 (thirty) days. Pt uses on the 1st of every month. 10 mL 1  . Fluticasone-Salmeterol (ADVAIR) 250-50 MCG/DOSE AEPB Inhale 1 puff into the lungs 2 (two) times daily. Reported on 12/16/2015    . furosemide (LASIX) 20 MG tablet Take 20 mg by mouth daily as needed.     . hydroxychloroquine (PLAQUENIL) 200 MG tablet Take 200 mg by mouth daily.    Marland Kitchen letrozole (FEMARA) 2.5 MG tablet Take 1 tablet (2.5 mg total) by mouth daily. 45 tablet 0  . levocetirizine (XYZAL) 5 MG tablet Take 5 mg by mouth daily.    Marland Kitchen loperamide (IMODIUM) 2 MG capsule Take 2-4 mg by mouth as needed for diarrhea or loose stools.    . metoCLOPramide (REGLAN) 10 MG tablet Take 1 tablet (10 mg total) by mouth 4 (four)  times daily -  before meals and at bedtime. 120 tablet 5  . metoprolol succinate (TOPROL-XL) 25 MG 24 hr tablet Take 25 mg by mouth daily.    . mycophenolate (CELLCEPT) 500 MG tablet Take 1,000 mg by mouth 2 (two) times daily.    . Nutritional Supplements (FEEDING SUPPLEMENT, JEVITY 1.5 CAL/FIBER,) LIQD Place 1,000 mLs into feeding tube continuous. 30000 mL 1  . ondansetron (ZOFRAN-ODT) 4 MG disintegrating tablet Take 1 tablet (4 mg total) by mouth every 4 (four) hours as needed for nausea or vomiting. 60 tablet 3  . oxyCODONE-acetaminophen (PERCOCET/ROXICET) 5-325 MG tablet Take 1 tablet by mouth daily as needed for severe pain. 30 tablet 0  . pantoprazole sodium (PROTONIX) 40 mg/20 mL PACK Place 40 mg into feeding tube 2 (two) times daily.    . predniSONE (DELTASONE) 5 MG tablet Take 5 mg by mouth daily.     . promethazine (PHENERGAN) 25 MG tablet Take 6.25-12.5 mg by mouth every 4 (four) hours as needed for nausea or vomiting. Reported on 10/29/2015    . sucralfate (CARAFATE) 1 G tablet Take 1 tablet (1 g total) by mouth 4 (four) times daily -  with meals and at bedtime. 120 tablet 0  . SYNTHROID 150 MCG tablet Take 1 tablet (150 mcg total) by mouth daily. 30 tablet 11  . Tadalafil, PAH, (ADCIRCA) 20 MG TABS  Take 40 mg by mouth daily.    . Water For Irrigation, Sterile (FREE WATER) SOLN Place 150 mLs into feeding tube 2 (two) times daily. 1500 mL 2  . ALPRAZolam (XANAX) 0.25 MG tablet Take 1 tablet (0.25 mg total) by mouth at bedtime as needed for anxiety. (Patient not taking: Reported on 12/16/2015) 20 tablet 0  . azelastine (ASTELIN) 0.1 % nasal spray Place 1 spray into both nostrils 2 (two) times daily. Reported on 01/25/2016    . chlorpheniramine-HYDROcodone (TUSSIONEX PENNKINETIC ER) 10-8 MG/5ML SUER Take 5 mLs by mouth at bedtime as needed for cough. (Patient not taking: Reported on 12/11/2015) 140 mL 0  . levofloxacin (LEVAQUIN) 500 MG tablet Take 1 tablet (500 mg total) by mouth daily. (Patient  not taking: Reported on 10/20/2015) 7 tablet 0  . ranitidine (ZANTAC) 150 MG tablet TAKE 1 TABLET (150 MG TOTAL) BY MOUTH AT BEDTIME. (Patient not taking: Reported on 12/11/2015) 90 tablet 2   Facility-Administered Medications Prior to Visit  Medication Dose Route Frequency Provider Last Rate Last Dose  . iohexol (OMNIPAQUE) 180 MG/ML injection 20 mL  20 mL Other Once PRN Yevette Edwards, MD      . lactated ringers infusion 1,000 mL  1,000 mL Intravenous Continuous Yevette Edwards, MD      . lidocaine (PF) (XYLOCAINE) 1 % injection 5 mL  5 mL Subcutaneous Once Yevette Edwards, MD      . midazolam (VERSED) 5 MG/5ML injection 5 mg  5 mg Intravenous Once Yevette Edwards, MD      . ropivacaine (PF) 2 mg/ml (0.2%) (NAROPIN) epidural 10 mL  10 mL Epidural Once Yevette Edwards, MD      . sodium chloride flush (NS) 0.9 % injection 10 mL  10 mL Other Once Yevette Edwards, MD      . triamcinolone acetonide (KENALOG-40) injection 40 mg  40 mg Other Once Yevette Edwards, MD       ROS Review of Systems  Objective:  BP 119/68 mmHg  Pulse 78  Temp(Src) 98.8 F (37.1 C) (Oral)  Resp 19  Ht 5\' 3"  (1.6 m)  Wt 118 lb (53.524 kg)  BMI 20.91 kg/m2  SpO2 91%   BP Readings from Last 3 Encounters:  01/25/16 119/68  12/16/15 118/65  11/18/15 116/67     Wt Readings from Last 3 Encounters:  01/25/16 118 lb (53.524 kg)  01/19/16 118 lb (53.524 kg)  01/06/16 118 lb (53.524 kg)     Physical Exam  PERRL EOMI HEART IS RRR  LCTA MUSCULOSKELETAL her straight leg raise is negative. She does have significant pain with extension and right lateral rotation. This is greater than with left lateral rotation. No overt trigger points are noted  Labs  Lab Results  Component Value Date   HGBA1C 5.9 07/03/2015   HGBA1C 7.1* 11/19/2014   HGBA1C 6.9* 04/22/2014   Lab Results  Component Value Date   MICROALBUR <0.7 11/28/2014   LDLCALC 64 10/11/2013   CREATININE 1.22* 09/29/2015     -------------------------------------------------------------------------------------------------------------------- Lab Results  Component Value Date   WBC 8.0 10/20/2015   HGB 9.9* 10/20/2015   HCT 30.6* 10/20/2015   PLT 299 10/20/2015   GLUCOSE 119* 09/29/2015   CHOL 164 10/11/2013   TRIG 152.0* 10/11/2013   HDL 69.70 10/11/2013   LDLDIRECT 28.0 07/03/2015   LDLCALC 64 10/11/2013   ALT 10 09/29/2015   AST 19 09/29/2015   NA 141 09/29/2015   K 4.3  09/29/2015   CL 102 09/29/2015   CREATININE 1.22* 09/29/2015   BUN 23 09/29/2015   CO2 29 09/29/2015   TSH 2.916 04/01/2015   INR 1.22 06/10/2015   HGBA1C 5.9 07/03/2015   MICROALBUR <0.7 11/28/2014    --------------------------------------------------------------------------------------------------------------------- Mm Diag Breast Tomo Bilateral  09/24/2015  CLINICAL DATA:  81 year old female presenting for routine postlumpectomy follow-up. The patient has had a left breast lumpectomy in 2015 followed by radiation therapy. EXAM: DIGITAL DIAGNOSTIC BILATERAL MAMMOGRAM WITH 3D TOMOSYNTHESIS AND CAD COMPARISON:  Previous exam(s). ACR Breast Density Category d: The breast tissue is extremely dense, which lowers the sensitivity of mammography. FINDINGS: There is a marked increase in apparent density of the breast bilaterally, due to marked weight loss in the since the prior mammogram. Interval changes in the upper-outer quadrant of the left breast, consistent with postlumpectomy changes. No suspicious calcifications, masses or areas of distortion are seen in the bilateral breasts. Mammographic images were processed with CAD. IMPRESSION: 1. Interval left breast lumpectomy, without evidence of disease recurrence. 2.  No mammographic evidence of malignancy in the bilateral breasts. 3. Marked weight loss causing increased density of the bilateral breasts. RECOMMENDATION: Diagnostic mammogram is suggested in 1 year. (Code:[id number]-B-01Y) I have  discussed the findings and recommendations with the patient. Results were also provided in writing at the conclusion of the visit. If applicable, a reminder letter will be sent to the patient regarding the next appointment. BI-RADS CATEGORY  2: Benign. Electronically Signed   By: Frederico Hamman M.D.   On: 09/24/2015 13:56     Assessment & Plan:   Latoysha was seen today for back pain.  Diagnoses and all orders for this visit:  Facet arthritis of lumbosacral region -     triamcinolone acetonide (KENALOG-40) injection 40 mg; 1 mL (40 mg total) by Other route once. -     sodium chloride flush (NS) 0.9 % injection 10 mL; 10 mLs by Other route once. -     ropivacaine (PF) 2 mg/ml (0.2%) (NAROPIN) epidural 10 mL; 10 mLs by Epidural route once. -     midazolam (VERSED) 5 MG/5ML injection 5 mg; Inject 5 mLs (5 mg total) into the vein once. -     lidocaine (PF) (XYLOCAINE) 1 % injection 5 mL; Inject 5 mLs into the skin once. -     lactated ringers infusion 1,000 mL; Inject 1,000 mLs into the vein continuous. -     LUMBAR FACET(MEDIAL BRANCH NERVE BLOCK) MBNB; Future  Other orders -     sodium chloride 0.9 % injection;  -     ropivacaine (PF) 2 mg/ml (0.2%) (NAROPIN) 2 MG/ML epidural;  -     triamcinolone acetonide (KENALOG-40) 40 MG/ML injection;  -     midazolam (VERSED) 5 MG/5ML injection;  -     fentaNYL (SUBLIMAZE) 100 MCG/2ML injection;  -     lidocaine (PF) (XYLOCAINE) 1 % injection;         ----------------------------------------------------------------------------------------------------------------------  Problem List Items Addressed This Visit      Musculoskeletal and Integument   Facet arthritis of lumbosacral region - Primary   Relevant Medications   triamcinolone acetonide (KENALOG-40) injection 40 mg   sodium chloride flush (NS) 0.9 % injection 10 mL   ropivacaine (PF) 2 mg/ml (0.2%) (NAROPIN) epidural 10 mL   midazolam (VERSED) 5 MG/5ML injection 5 mg   lidocaine  (PF) (XYLOCAINE) 1 % injection 5 mL   lactated ringers infusion 1,000 mL   triamcinolone acetonide (KENALOG-40) 40  MG/ML injection (Completed)   Other Relevant Orders   LUMBAR FACET(MEDIAL BRANCH NERVE BLOCK) MBNB        ----------------------------------------------------------------------------------------------------------------------  1.  lumbar facet arthropathy and DDD (degenerative disc disease), lumbar We'll plan on aRepeat facet injection right side for  therapeutic purpose today. The risks and benefits of the procedure have been described in full detail. We'll have her return to clinic in 1 month for reevaluation and possible repeat injection at that time   2. Spinal stenosis of lumbar region   3. Sciatica associated with disorder of lumbar spine, right     ----------------------------------------------------------------------------------------------------------------------  I am having Ms. Grussing maintain her SYNTHROID, hydroxychloroquine, Tadalafil (PAH), acetaminophen, Fluticasone-Salmeterol, loperamide, azelastine, levocetirizine, promethazine, predniSONE, pantoprazole sodium, feeding supplement (JEVITY 1.5 CAL/FIBER), ALPRAZolam, free water, ondansetron, sucralfate, cholecalciferol, furosemide, metoprolol succinate, budesonide-formoterol, levofloxacin, chlorpheniramine-HYDROcodone, metoCLOPramide, cyanocobalamin, ranitidine, mycophenolate, oxyCODONE-acetaminophen, and letrozole. We administered ropivacaine (PF) 2 mg/ml (0.2%), triamcinolone acetonide, midazolam, and lidocaine (PF). We will continue to administer lidocaine (PF), midazolam, ropivacaine (PF) 2 mg/ml (0.2%), sodium chloride flush, triamcinolone acetonide, iohexol, lactated ringers, triamcinolone acetonide, sodium chloride flush, ropivacaine (PF) 2 mg/ml (0.2%), midazolam, lidocaine (PF), and lactated ringers.   Meds ordered this encounter  Medications  . triamcinolone acetonide (KENALOG-40) injection 40 mg     Sig:   . sodium chloride flush (NS) 0.9 % injection 10 mL    Sig:   . ropivacaine (PF) 2 mg/ml (0.2%) (NAROPIN) epidural 10 mL    Sig:   . midazolam (VERSED) 5 MG/5ML injection 5 mg    Sig:   . lidocaine (PF) (XYLOCAINE) 1 % injection 5 mL    Sig:   . lactated ringers infusion 1,000 mL    Sig:   . sodium chloride 0.9 % injection    Sig:     GARNER, CYNTHIA: cabinet override  . ropivacaine (PF) 2 mg/ml (0.2%) (NAROPIN) 2 MG/ML epidural    Sig:     GARNER, CYNTHIA: cabinet override  . triamcinolone acetonide (KENALOG-40) 40 MG/ML injection    Sig:     GARNER, CYNTHIA: cabinet override  . midazolam (VERSED) 5 MG/5ML injection    Sig:     GARNER, CYNTHIA: cabinet override  . fentaNYL (SUBLIMAZE) 100 MCG/2ML injection    Sig:     GARNER, CYNTHIA: cabinet override  . lidocaine (PF) (XYLOCAINE) 1 % injection    Sig:     GARNER, CYNTHIA: cabinet override   Patient's Medications  New Prescriptions   No medications on file  Previous Medications   ACETAMINOPHEN (TYLENOL) 325 MG TABLET    Take 650 mg by mouth every 4 (four) hours as needed for mild pain or fever.   ALPRAZOLAM (XANAX) 0.25 MG TABLET    Take 1 tablet (0.25 mg total) by mouth at bedtime as needed for anxiety.   AZELASTINE (ASTELIN) 0.1 % NASAL SPRAY    Place 1 spray into both nostrils 2 (two) times daily. Reported on 01/25/2016   BUDESONIDE-FORMOTEROL (SYMBICORT) 160-4.5 MCG/ACT INHALER    Inhale 2 puffs into the lungs 2 (two) times daily. Reported on 12/16/2015   CHLORPHENIRAMINE-HYDROCODONE (TUSSIONEX PENNKINETIC ER) 10-8 MG/5ML SUER    Take 5 mLs by mouth at bedtime as needed for cough.   CHOLECALCIFEROL (VITAMIN D) 400 UNITS TABS TABLET    Take 400 Units by mouth.   CYANOCOBALAMIN (,VITAMIN B-12,) 1000 MCG/ML INJECTION    Inject 1 mL (1,000 mcg total) into the muscle every 30 (thirty) days. Pt uses on the 1st of every month.  FLUTICASONE-SALMETEROL (ADVAIR) 250-50 MCG/DOSE AEPB    Inhale 1 puff into the lungs 2 (two)  times daily. Reported on 12/16/2015   FUROSEMIDE (LASIX) 20 MG TABLET    Take 20 mg by mouth daily as needed.    HYDROXYCHLOROQUINE (PLAQUENIL) 200 MG TABLET    Take 200 mg by mouth daily.   LETROZOLE (FEMARA) 2.5 MG TABLET    Take 1 tablet (2.5 mg total) by mouth daily.   LEVOCETIRIZINE (XYZAL) 5 MG TABLET    Take 5 mg by mouth daily.   LEVOFLOXACIN (LEVAQUIN) 500 MG TABLET    Take 1 tablet (500 mg total) by mouth daily.   LOPERAMIDE (IMODIUM) 2 MG CAPSULE    Take 2-4 mg by mouth as needed for diarrhea or loose stools.   METOCLOPRAMIDE (REGLAN) 10 MG TABLET    Take 1 tablet (10 mg total) by mouth 4 (four) times daily -  before meals and at bedtime.   METOPROLOL SUCCINATE (TOPROL-XL) 25 MG 24 HR TABLET    Take 25 mg by mouth daily.   MYCOPHENOLATE (CELLCEPT) 500 MG TABLET    Take 1,000 mg by mouth 2 (two) times daily.   NUTRITIONAL SUPPLEMENTS (FEEDING SUPPLEMENT, JEVITY 1.5 CAL/FIBER,) LIQD    Place 1,000 mLs into feeding tube continuous.   ONDANSETRON (ZOFRAN-ODT) 4 MG DISINTEGRATING TABLET    Take 1 tablet (4 mg total) by mouth every 4 (four) hours as needed for nausea or vomiting.   OXYCODONE-ACETAMINOPHEN (PERCOCET/ROXICET) 5-325 MG TABLET    Take 1 tablet by mouth daily as needed for severe pain.   PANTOPRAZOLE SODIUM (PROTONIX) 40 MG/20 ML PACK    Place 40 mg into feeding tube 2 (two) times daily.   PREDNISONE (DELTASONE) 5 MG TABLET    Take 5 mg by mouth daily.    PROMETHAZINE (PHENERGAN) 25 MG TABLET    Take 6.25-12.5 mg by mouth every 4 (four) hours as needed for nausea or vomiting. Reported on 10/29/2015   RANITIDINE (ZANTAC) 150 MG TABLET    TAKE 1 TABLET (150 MG TOTAL) BY MOUTH AT BEDTIME.   SUCRALFATE (CARAFATE) 1 G TABLET    Take 1 tablet (1 g total) by mouth 4 (four) times daily -  with meals and at bedtime.   SYNTHROID 150 MCG TABLET    Take 1 tablet (150 mcg total) by mouth daily.   TADALAFIL, PAH, (ADCIRCA) 20 MG TABS    Take 40 mg by mouth daily.   WATER FOR IRRIGATION, STERILE  (FREE WATER) SOLN    Place 150 mLs into feeding tube 2 (two) times daily.  Modified Medications   No medications on file  Discontinued Medications   No medications on file   ----------------------------------------------------------------------------------------------------------------------  Follow-up: Return in about 1 month (around 02/25/2016) for evaluation, procedure.   Procedure:    Patient was taken to the fluoroscopy suite and placed in prone position. A total dose of 2 mg of Versed with half cc of fentanyl were titrated for moderate sedation. Vital signs are stable throughout the procedure. The area overlying the aforementioned facets were prepped with Betadine 3 and strict aseptic technique was utilized throughout the procedure. I Identified the areas overlying the aforementioned right facets at  L3-4 or L4-5 L5-S1. 1% lidocaine 1 cc was infiltrated subcutaneous and into the fascia with a 25-gauge needle at each of these sites. I then advanced a 22-gauge 3-1/2 inch Quinckie needle with the needle tip to lie at the [facility] portion of the [employer] dog".  There was negative aspiration for heme or CSF and  no paresthesia. I then injected 2 cc of ropivacaine 0.2% mixed with 8 mg of triamcinolone at each of the aforementioned sites. These needles were withdrawn and the L5-S1 needle was redirected towards the right S1 posterior foramen. This was once again no paresthesia, negative aspiration and 2 cc of this same mixture was injected at this site. The patient was convalesced discharged home stable condition for follow-up as mentioned. Yevette Edwards, MD

## 2016-01-28 DIAGNOSIS — M7989 Other specified soft tissue disorders: Secondary | ICD-10-CM | POA: Diagnosis not present

## 2016-01-28 DIAGNOSIS — I824Y9 Acute embolism and thrombosis of unspecified deep veins of unspecified proximal lower extremity: Secondary | ICD-10-CM | POA: Diagnosis not present

## 2016-01-28 DIAGNOSIS — I279 Pulmonary heart disease, unspecified: Secondary | ICD-10-CM | POA: Diagnosis not present

## 2016-01-28 DIAGNOSIS — I80291 Phlebitis and thrombophlebitis of other deep vessels of right lower extremity: Secondary | ICD-10-CM | POA: Diagnosis not present

## 2016-01-28 DIAGNOSIS — I890 Lymphedema, not elsewhere classified: Secondary | ICD-10-CM | POA: Diagnosis not present

## 2016-01-28 DIAGNOSIS — K922 Gastrointestinal hemorrhage, unspecified: Secondary | ICD-10-CM | POA: Diagnosis not present

## 2016-01-28 DIAGNOSIS — E785 Hyperlipidemia, unspecified: Secondary | ICD-10-CM | POA: Diagnosis not present

## 2016-01-28 DIAGNOSIS — I82401 Acute embolism and thrombosis of unspecified deep veins of right lower extremity: Secondary | ICD-10-CM | POA: Diagnosis not present

## 2016-01-28 DIAGNOSIS — I10 Essential (primary) hypertension: Secondary | ICD-10-CM | POA: Diagnosis not present

## 2016-02-01 ENCOUNTER — Ambulatory Visit: Payer: Self-pay | Admitting: *Deleted

## 2016-02-01 ENCOUNTER — Ambulatory Visit (INDEPENDENT_AMBULATORY_CARE_PROVIDER_SITE_OTHER): Payer: Medicare Other | Admitting: Internal Medicine

## 2016-02-01 VITALS — BP 98/64 | HR 94 | Temp 98.2°F | Resp 15 | Ht 63.0 in | Wt 121.6 lb

## 2016-02-01 DIAGNOSIS — D509 Iron deficiency anemia, unspecified: Secondary | ICD-10-CM

## 2016-02-01 DIAGNOSIS — E038 Other specified hypothyroidism: Secondary | ICD-10-CM | POA: Diagnosis not present

## 2016-02-01 DIAGNOSIS — E46 Unspecified protein-calorie malnutrition: Secondary | ICD-10-CM

## 2016-02-01 DIAGNOSIS — R634 Abnormal weight loss: Secondary | ICD-10-CM

## 2016-02-01 DIAGNOSIS — M5431 Sciatica, right side: Secondary | ICD-10-CM

## 2016-02-01 DIAGNOSIS — K3189 Other diseases of stomach and duodenum: Secondary | ICD-10-CM

## 2016-02-01 DIAGNOSIS — M8448XP Pathological fracture, other site, subsequent encounter for fracture with malunion: Secondary | ICD-10-CM | POA: Diagnosis not present

## 2016-02-01 DIAGNOSIS — K208 Other esophagitis: Secondary | ICD-10-CM

## 2016-02-01 DIAGNOSIS — E034 Atrophy of thyroid (acquired): Secondary | ICD-10-CM

## 2016-02-01 DIAGNOSIS — K2210 Ulcer of esophagus without bleeding: Secondary | ICD-10-CM

## 2016-02-01 MED ORDER — OXYCODONE-ACETAMINOPHEN 5-325 MG PO TABS: 1.0000 | ORAL_TABLET | Freq: Every day | ORAL | Status: DC | PRN

## 2016-02-01 MED ORDER — PROMETHAZINE HCL 12.5 MG PO TABS: 6.2500 mg | ORAL_TABLET | ORAL | Status: DC | PRN

## 2016-02-01 NOTE — Progress Notes (Signed)
Pre visit review using our clinic review tool, if applicable. No additional management support is needed unless otherwise documented below in the visit note. 

## 2016-02-01 NOTE — Progress Notes (Signed)
Subjective:  Patient ID: Diana [name], female    DOB: December 20, 1934  Age: 81 y.o. MRN: [id number]  CC: The primary encounter diagnosis was Iron deficiency anemia. Diagnoses of Hypothyroidism due to acquired atrophy of thyroid, Loss of weight, Non-traumatic compression fracture of L2 lumbar vertebra, with malunion, subsequent encounter, Gastric volvulus, Protein-calorie malnutrition (HCC), Sciatica of right side, and Erosive esophagitis were also pertinent to this visit.  HPI Diana [name] presents for follow up on chronic issues including chronic severe back pain from spinal stenosis.  She was last seen  In January for IDA.     She has a history of gastric volvulus in March 11 2015 s/p  Exploratory laparotomy /reduction of volvulus  with gastropexy,  splenectomy and gastric tube placement under emergent conditions by Adela Glimpse .   She continues to suffer from severe erosive esophagitis  and requires supplemental feedings with gastric tube.  Dr. Marva Panda is prescribing carafate 1 gm qid and protnoix 40 mg bid .  She has had a nutritional consults, and recently had a bout of diarrhea after her jevity tube feeds were increased, but the diarrhea has now resolved.  She is able to ingest only liquids periorally and is drinking at least one Premier Protein shake daily ,  Which supplies 30 g protein,  60 cal and has only 1 g sugar.   Last EGD Jan 2017. Dr Marva Panda is considering a second opinion for a specialist at [facility] regarding need for surgery, following a repeat EGD . Albumin had increased from 2.7 in October to 3.4 in Apri, and she has gained 2 lbs. She is requesting a refill on phenergan to managed chronic morning nausea.  zofran has been tried and does not help.   IDA: hgb was 11.6 in April per GI. iron stores in Feb normal TIBC, low transferring and saturation .   She is scheduled to have bilateral cataract surgery this summer by Pandora Leiter. The right eye cataract surgery planned for July  18.  Back pain:  She has been in constant low back pain despite  A right sided lumbosacral nerve  Block on March 30, which followed 2 ESI  's to same area by the local Pain clinic.  She is in tears from her persistent pain. She has had a kyphoplasty in May 2016 for L2 compression fracture  She is using the Percocet I prescribed once daily and feels that the dose is effective but only transiently.  She would like to use if more often.  She is able to do a few house chores including folding clothes/laundry  And washing dishes..  She lives were her grown employed son  And is accompanied today by her niece Diana [name].      Outpatient Prescriptions Prior to Visit  Medication Sig Dispense Refill  . acetaminophen (TYLENOL) 325 MG tablet Take 650 mg by mouth every 4 (four) hours as needed for mild pain or fever.    . ALPRAZolam (XANAX) 0.25 MG tablet Take 1 tablet (0.25 mg total) by mouth at bedtime as needed for anxiety. (Patient not taking: Reported on 12/16/2015) 20 tablet 0  . azelastine (ASTELIN) 0.1 % nasal spray Place 1 spray into both nostrils 2 (two) times daily. Reported on 01/25/2016    . budesonide-formoterol (SYMBICORT) 160-4.5 MCG/ACT inhaler Inhale 2 puffs into the lungs 2 (two) times daily. Reported on 12/16/2015    . chlorpheniramine-HYDROcodone (TUSSIONEX PENNKINETIC ER) 10-8 MG/5ML SUER Take 5 mLs by mouth at bedtime  as needed for cough. (Patient not taking: Reported on 12/11/2015) 140 mL 0  . cholecalciferol (VITAMIN D) 400 UNITS TABS tablet Take 400 Units by mouth.    . cyanocobalamin (,VITAMIN B-12,) 1000 MCG/ML injection Inject 1 mL (1,000 mcg total) into the muscle every 30 (thirty) days. Pt uses on the 1st of every month. 10 mL 1  . Fluticasone-Salmeterol (ADVAIR) 250-50 MCG/DOSE AEPB Inhale 1 puff into the lungs 2 (two) times daily. Reported on 12/16/2015    . furosemide (LASIX) 20 MG tablet Take 20 mg by mouth daily as needed.     . hydroxychloroquine (PLAQUENIL) 200 MG tablet Take 200 mg  by mouth daily.    Marland Kitchen letrozole (FEMARA) 2.5 MG tablet Take 1 tablet (2.5 mg total) by mouth daily. 45 tablet 0  . levocetirizine (XYZAL) 5 MG tablet Take 5 mg by mouth daily.    Marland Kitchen levofloxacin (LEVAQUIN) 500 MG tablet Take 1 tablet (500 mg total) by mouth daily. (Patient not taking: Reported on 10/20/2015) 7 tablet 0  . loperamide (IMODIUM) 2 MG capsule Take 2-4 mg by mouth as needed for diarrhea or loose stools.    . metoCLOPramide (REGLAN) 10 MG tablet Take 1 tablet (10 mg total) by mouth 4 (four) times daily -  before meals and at bedtime. 120 tablet 5  . metoprolol succinate (TOPROL-XL) 25 MG 24 hr tablet Take 25 mg by mouth daily.    . mycophenolate (CELLCEPT) 500 MG tablet Take 1,000 mg by mouth 2 (two) times daily.    . Nutritional Supplements (FEEDING SUPPLEMENT, JEVITY 1.5 CAL/FIBER,) LIQD Place 1,000 mLs into feeding tube continuous. 30000 mL 1  . pantoprazole sodium (PROTONIX) 40 mg/20 mL PACK Place 40 mg into feeding tube 2 (two) times daily.    . predniSONE (DELTASONE) 5 MG tablet Take 5 mg by mouth daily.     . sucralfate (CARAFATE) 1 G tablet Take 1 tablet (1 g total) by mouth 4 (four) times daily -  with meals and at bedtime. 120 tablet 0  . SYNTHROID 150 MCG tablet Take 1 tablet (150 mcg total) by mouth daily. 30 tablet 11  . Tadalafil, PAH, (ADCIRCA) 20 MG TABS Take 40 mg by mouth daily.    . Water For Irrigation, Sterile (FREE WATER) SOLN Place 150 mLs into feeding tube 2 (two) times daily. 1500 mL 2  . ondansetron (ZOFRAN-ODT) 4 MG disintegrating tablet Take 1 tablet (4 mg total) by mouth every 4 (four) hours as needed for nausea or vomiting. 60 tablet 3  . oxyCODONE-acetaminophen (PERCOCET/ROXICET) 5-325 MG tablet Take 1 tablet by mouth daily as needed for severe pain. 30 tablet 0  . promethazine (PHENERGAN) 25 MG tablet Take 6.25-12.5 mg by mouth every 4 (four) hours as needed for nausea or vomiting. Reported on 10/29/2015    . ranitidine (ZANTAC) 150 MG tablet TAKE 1 TABLET (150  MG TOTAL) BY MOUTH AT BEDTIME. (Patient not taking: Reported on 12/11/2015) 90 tablet 2   Facility-Administered Medications Prior to Visit  Medication Dose Route Frequency Provider Last Rate Last Dose  . iohexol (OMNIPAQUE) 180 MG/ML injection 20 mL  20 mL Other Once PRN Yevette Edwards, MD      . lactated ringers infusion 1,000 mL  1,000 mL Intravenous Continuous Yevette Edwards, MD      . lactated ringers infusion 1,000 mL  1,000 mL Intravenous Continuous Yevette Edwards, MD      . lidocaine (PF) (XYLOCAINE) 1 % injection 5 mL  5  mL Subcutaneous Once Yevette Edwards, MD      . lidocaine (PF) (XYLOCAINE) 1 % injection 5 mL  5 mL Subcutaneous Once Yevette Edwards, MD      . midazolam (VERSED) 5 MG/5ML injection 5 mg  5 mg Intravenous Once Yevette Edwards, MD      . midazolam (VERSED) 5 MG/5ML injection 5 mg  5 mg Intravenous Once Yevette Edwards, MD      . ropivacaine (PF) 2 mg/ml (0.2%) (NAROPIN) epidural 10 mL  10 mL Epidural Once Yevette Edwards, MD      . ropivacaine (PF) 2 mg/ml (0.2%) (NAROPIN) epidural 10 mL  10 mL Epidural Once Yevette Edwards, MD      . sodium chloride flush (NS) 0.9 % injection 10 mL  10 mL Other Once Yevette Edwards, MD      . sodium chloride flush (NS) 0.9 % injection 10 mL  10 mL Other Once Yevette Edwards, MD      . triamcinolone acetonide (KENALOG-40) injection 40 mg  40 mg Other Once Yevette Edwards, MD      . triamcinolone acetonide (KENALOG-40) injection 40 mg  40 mg Other Once Yevette Edwards, MD        Review of Systems;  Patient denies headache, fevers, malaise, unintentional weight loss, skin rash, eye pain, sinus congestion and sinus pain, sore throat, dysphagia,  hemoptysis , cough, dyspnea, wheezing, chest pain, palpitations, orthopnea, edema, abdominal pain, nausea, melena, diarrhea, constipation, flank pain, dysuria, hematuria, urinary  Frequency, nocturia, numbness, tingling, seizures,  Focal weakness, Loss of consciousness,  Tremor, insomnia, depression, anxiety, and suicidal  ideation.      Objective:  BP 98/64 mmHg  Pulse 94  Temp(Src) 98.2 F (36.8 C)  Resp 15  Ht 5\' 3"  (1.6 m)  Wt 121 lb 9.6 oz (55.157 kg)  BMI 21.55 kg/m2  SpO2 95%  BP Readings from Last 3 Encounters:  02/01/16 98/64  01/25/16 119/68  12/16/15 118/65    Wt Readings from Last 3 Encounters:  02/01/16 121 lb 9.6 oz (55.157 kg)  01/25/16 118 lb (53.524 kg)  01/19/16 118 lb (53.524 kg)    General appearance: alert, cooperative and appears stated age Ears: normal TM's and external ear canals both ears Throat: lips, mucosa, and tongue normal; teeth and gums normal Neck: no adenopathy, no carotid bruit, supple, symmetrical, trachea midline and thyroid not enlarged, symmetric, no tenderness/mass/nodules Back: symmetric, no curvature. ROM normal. No CVA tenderness. Lungs: clear to auscultation bilaterally Heart: regular rate and rhythm, S1, S2 normal, no murmur, click, rub or gallop Abdomen: soft, non-tender; bowel sounds normal; no masses,  no organomegaly Pulses: 2+ and symmetric Skin: Skin color, texture, turgor normal. No rashes or lesions Lymph nodes: Cervical, supraclavicular, and axillary nodes normal.  Lab Results  Component Value Date   HGBA1C 5.9 07/03/2015   HGBA1C 7.1* 11/19/2014   HGBA1C 6.9* 04/22/2014    Lab Results  Component Value Date   CREATININE 1.22* 09/29/2015   CREATININE 1.37* 08/17/2015   CREATININE 1.01 07/27/2015    Lab Results  Component Value Date   WBC 8.0 10/20/2015   HGB 9.9* 10/20/2015   HCT 30.6* 10/20/2015   PLT 299 10/20/2015   GLUCOSE 119* 09/29/2015   CHOL 164 10/11/2013   TRIG 152.0* 10/11/2013   HDL 69.70 10/11/2013   LDLDIRECT 28.0 07/03/2015   LDLCALC 64 10/11/2013   ALT 10 09/29/2015   AST 19 09/29/2015   NA  141 09/29/2015   K 4.3 09/29/2015   CL 102 09/29/2015   CREATININE 1.22* 09/29/2015   BUN 23 09/29/2015   CO2 29 09/29/2015   TSH 2.916 04/01/2015   INR 1.22 06/10/2015   HGBA1C 5.9 07/03/2015   MICROALBUR  <0.7 11/28/2014    Dg Ugi  W/kub  11/02/2015  CLINICAL DATA:  History of erosive esophagitis, gastroesophageal reflux, in and difficulty swallowing; abnormal appearance she of the of the esophagus at endoscopy 1 week ago. EXAM: UPPER GI SERIES WITH KUB TECHNIQUE: After obtaining a scout radiograph a routine upper GI series was performed using thin and high density barium. Effervescent crystals were administered. The patient was unable to tolerate the full procedure however. FLUOROSCOPY TIME:  Fluoroscopy Time (in minutes and seconds): 1 minutes, 12 seconds Number of Acquired Images:  12 COMPARISON:  Abdominal and pelvic CT scan of June 08, 2015. FINDINGS: The patent was unable to tolerate more than a small amount of the barium and the effervescent crystals. She had emesis during the study and was unable to complete the full procedure. The esophagus was somewhat tortuous in its course but distended well. No aspiration was observed. There is a moderate-sized non reducible hiatal hernia -partially intra thoracic stomach. No ulcer niche within the hiatal hernia or esophagus was observed. There was limited distention of the stomach. The stomach did not empty during the course of the procedure and the patient was unable to ingest additional barium. The gastrostomy tube balloon and catheter appear to be in reasonable position. IMPRESSION: This is a very limited study due to the patient's nausea and vomiting. Moderate size non reducible hiatal hernia -partially intra thoracic stomach. There is no evidence of esophageal stricture. The images available are quite limited and the esophageal mucosa cannot be well assessed. This also limited demonstration of the stomach with gastric emptying not observed. Electronically Signed   By: David  [place] M.D.   On: 11/02/2015 10:32    Assessment & Plan:   Problem List Items Addressed This Visit    Erosive esophagitis (Chronic)    Managed with carafate,  protonix and  jevity tube feeds   Repeat EGD planned by Skuslkie,  Repairing persistent hiatal hernia has been recommended but she is understandably reluctant.       Sciatica of right side    Her pain is persistent despite ESI and nerve blocks.  Will increase percocet to 2 or 3 daily and will likely need transition to long acting oxycontin a some point in the near future.        Gastric volvulus    She continues to require gastrostomy tube for nutritional support and has been advisedby GI Marva Panda)  to consider another surgery for reduction of hernia .       Protein-calorie malnutrition (HCC)    Secondary to severe erosive esophagitis,  requiring jevity tube feeds overnight , managed by Nurtiional consult ordered by Dr. Marva Panda,   [facility] is up and albumin stores are improving       Non-traumatic compression fracture of L2 lumbar vertebra   Hypothyroidism   Relevant Orders   T4 AND TSH    Other Visit Diagnoses    Iron deficiency anemia    -  Primary    Relevant Orders    CBC with Differential/Platelet    Loss of weight        Relevant Orders    Comp Met (CMET)      A total of 40 minutes was spent  with patient more than half of which was spent in counseling patient on the above mentioned issues , reviewing and explaining recent labs and imaging studies done, and coordination of care.  I have discontinued Ms. Belnap's ondansetron and ranitidine. I have also changed her promethazine. Additionally, I am having her maintain her SYNTHROID, hydroxychloroquine, Tadalafil (PAH), acetaminophen, Fluticasone-Salmeterol, loperamide, azelastine, levocetirizine, predniSONE, pantoprazole sodium, feeding supplement (JEVITY 1.5 CAL/FIBER), ALPRAZolam, free water, sucralfate, cholecalciferol, furosemide, metoprolol succinate, budesonide-formoterol, levofloxacin, chlorpheniramine-HYDROcodone, metoCLOPramide, cyanocobalamin, mycophenolate, letrozole, and oxyCODONE-acetaminophen. We will continue to administer lidocaine  (PF), midazolam, ropivacaine (PF) 2 mg/ml (0.2%), sodium chloride flush, triamcinolone acetonide, iohexol, lactated ringers, triamcinolone acetonide, sodium chloride flush, ropivacaine (PF) 2 mg/ml (0.2%), midazolam, lidocaine (PF), and lactated ringers.  Meds ordered this encounter  Medications  . oxyCODONE-acetaminophen (PERCOCET/ROXICET) 5-325 MG tablet    Sig: Take 1 tablet by mouth daily as needed for severe pain.    Dispense:  90 tablet    Refill:  0  . promethazine (PHENERGAN) 12.5 MG tablet    Sig: Take 0.5-1 tablets (6.25-12.5 mg total) by mouth every 4 (four) hours as needed for nausea or vomiting. Reported on 10/29/2015    Dispense:  45 tablet    Refill:  4    Medications Discontinued During This Encounter  Medication Reason  . oxyCODONE-acetaminophen (PERCOCET/ROXICET) 5-325 MG tablet Reorder  . ranitidine (ZANTAC) 150 MG tablet   . ondansetron (ZOFRAN-ODT) 4 MG disintegrating tablet   . promethazine (PHENERGAN) 25 MG tablet Reorder    Follow-up: Return in about 3 months (around 05/03/2016).   Sherlene Shams, MD

## 2016-02-01 NOTE — Patient Instructions (Addendum)
I am  Clearing you for cataract surgery today  I am increasing your oxycodone refill to #90  so that you can take up to 3 daily if needed  Phenergan refilled  At the 12.5 mg dose since you are taking narcotics and both are sedating    We'll refill your thyroid once I see your TSH

## 2016-02-02 ENCOUNTER — Ambulatory Visit: Payer: Self-pay | Admitting: *Deleted

## 2016-02-02 ENCOUNTER — Other Ambulatory Visit: Payer: Self-pay | Admitting: *Deleted

## 2016-02-02 DIAGNOSIS — C50412 Malignant neoplasm of upper-outer quadrant of left female breast: Secondary | ICD-10-CM

## 2016-02-02 LAB — COMPREHENSIVE METABOLIC PANEL
ALBUMIN: 3.7 g/dL (ref 3.5–5.2)
ALK PHOS: 88 U/L (ref 39–117)
ALT: 24 U/L (ref 0–35)
AST: 24 U/L (ref 0–37)
BUN: 38 mg/dL — AB (ref 6–23)
CALCIUM: 9.7 mg/dL (ref 8.4–10.5)
CHLORIDE: 103 meq/L (ref 96–112)
CO2: 27 mEq/L (ref 19–32)
Creatinine, Ser: 0.89 mg/dL (ref 0.40–1.20)
GFR: 65.01 mL/min (ref >60.00)
Glucose, Bld: 123 mg/dL — ABNORMAL HIGH (ref 70–99)
POTASSIUM: 4.8 meq/L (ref 3.5–5.1)
Sodium: 138 mEq/L (ref 135–145)
TOTAL PROTEIN: 6.8 g/dL (ref 6.0–8.3)
Total Bilirubin: 0.3 mg/dL (ref 0.2–1.2)

## 2016-02-02 LAB — CBC WITH DIFFERENTIAL/PLATELET
BASOS ABS: 0.1 10*3/uL (ref 0.0–0.1)
BASOS PCT: 0.6 % (ref 0.0–3.0)
EOS ABS: 0 10*3/uL (ref 0.0–0.7)
Eosinophils Relative: 0.5 % (ref 0.0–5.0)
HEMATOCRIT: 36.4 % (ref 36.0–46.0)
HEMOGLOBIN: 11.7 g/dL — AB (ref 12.0–15.0)
LYMPHS PCT: 26 % (ref 12.0–46.0)
Lymphs Abs: 2.3 10*3/uL (ref 0.7–4.0)
MCHC: 32.1 g/dL (ref 30.0–36.0)
MCV: 98.5 fl (ref 78.0–100.0)
MONO ABS: 0.6 10*3/uL (ref 0.1–1.0)
Monocytes Relative: 7.3 % (ref 3.0–12.0)
Neutro Abs: 5.7 10*3/uL (ref 1.4–7.7)
Neutrophils Relative %: 65.6 % (ref 43.0–77.0)
Platelets: 330 10*3/uL (ref 150.0–400.0)
RBC: 3.7 Mil/uL — AB (ref 3.87–5.11)
RDW: 15.9 % — ABNORMAL HIGH (ref 11.5–15.5)
WBC: 8.8 10*3/uL (ref 4.0–10.5)

## 2016-02-02 NOTE — Assessment & Plan Note (Signed)
Secondary to severe erosive esophagitis,  requiring jevity tube feeds overnight , managed by Nurtiional consult ordered by Dr. Gustavo Lah,   [facility] is up and albumin stores are improving

## 2016-02-02 NOTE — Assessment & Plan Note (Signed)
Her pain is persistent despite ESI and nerve blocks.  Will increase percocet to 2 or 3 daily and will likely need transition to long acting oxycontin a some point in the near future.

## 2016-02-02 NOTE — Assessment & Plan Note (Signed)
She continues to require gastrostomy tube for nutritional support and has been advisedby GI Gustavo Lah)  to consider another surgery for reduction of hernia .

## 2016-02-02 NOTE — Assessment & Plan Note (Signed)
Managed with carafate,  protonix and jevity tube feeds   Repeat EGD planned by Skuslkie,  Repairing persistent hiatal hernia has been recommended but she is understandably reluctant.

## 2016-02-02 NOTE — Patient Outreach (Signed)
[facility]) Care Management  [facility] Care Manager  02/02/2016   Diana [name] 26-Jul-1935 [id number]  Subjective:RN Health Coach telephone call to patient.  [employer] compliance verified.Per patient she is during very well. She is in the green zone. Her weight is 121 yesterday in the Dr office with her shoes on. She hadn't weighed today.  Per patient she hadn't been long getting up.Patient has made an appointment for June to have her right cataract removed. Per patient she is now having some difficulty reading. Per patient she is tolerating her tube feedings real good. Patient stated that her main problem is back pain.. The Dr has increased her pain medicine and patient stated she is going to take it this time and not hurt as bad.  Rn discussed with patient fall prevention. Son has removed all the area rugs from the floor. RN reiterated the Congestive Heart failure zones .Patient has agreed to follow up outreach.   Objective:   Encounter Medications:  Outpatient Encounter Prescriptions as of 02/02/2016  Medication Sig Note  . acetaminophen (TYLENOL) 325 MG tablet Take 650 mg by mouth every 4 (four) hours as needed for mild pain or fever.   . ALPRAZolam (XANAX) 0.25 MG tablet Take 1 tablet (0.25 mg total) by mouth at bedtime as needed for anxiety. (Patient not taking: Reported on 12/16/2015) 12/16/2015: Patient reports she has not used in the past week.    Marland Kitchen azelastine (ASTELIN) 0.1 % nasal spray Place 1 spray into both nostrils 2 (two) times daily. Reported on 01/25/2016 12/16/2015: Patient reports she is not using  . budesonide-formoterol (SYMBICORT) 160-4.5 MCG/ACT inhaler Inhale 2 puffs into the lungs 2 (two) times daily. Reported on 12/16/2015 12/16/2015: Patient reports rx written by [facility] and out of inhaler.  . chlorpheniramine-HYDROcodone (TUSSIONEX PENNKINETIC ER) 10-8 MG/5ML SUER Take 5 mLs by mouth at bedtime as needed for cough. (Patient not taking: Reported on 12/11/2015)   .  cholecalciferol (VITAMIN D) 400 UNITS TABS tablet Take 400 Units by mouth.   . cyanocobalamin (,VITAMIN B-12,) 1000 MCG/ML injection Inject 1 mL (1,000 mcg total) into the muscle every 30 (thirty) days. Pt uses on the 1st of every month.   . Fluticasone-Salmeterol (ADVAIR) 250-50 MCG/DOSE AEPB Inhale 1 puff into the lungs 2 (two) times daily. Reported on 12/16/2015 12/16/2015: Patient reports rx came from [facility] and out of inhaler.    . furosemide (LASIX) 20 MG tablet Take 20 mg by mouth daily as needed.  12/16/2015: Patient reports uses prn.    . hydroxychloroquine (PLAQUENIL) 200 MG tablet Take 200 mg by mouth daily. 12/16/2015: Patient has stopped taking for procedure.  Today 12/16/15 is the 9th day that she has not taken this medication  . letrozole (FEMARA) 2.5 MG tablet Take 1 tablet (2.5 mg total) by mouth daily.   Marland Kitchen levocetirizine (XYZAL) 5 MG tablet Take 5 mg by mouth daily.   Marland Kitchen levofloxacin (LEVAQUIN) 500 MG tablet Take 1 tablet (500 mg total) by mouth daily. (Patient not taking: Reported on 10/20/2015)   . loperamide (IMODIUM) 2 MG capsule Take 2-4 mg by mouth as needed for diarrhea or loose stools.   . metoCLOPramide (REGLAN) 10 MG tablet Take 1 tablet (10 mg total) by mouth 4 (four) times daily -  before meals and at bedtime.   . metoprolol succinate (TOPROL-XL) 25 MG 24 hr tablet Take 25 mg by mouth daily. 12/16/2015: Patient reports she takes 1/2 tab daily.    . mycophenolate (CELLCEPT) 500 MG  tablet Take 1,000 mg by mouth 2 (two) times daily.   . Nutritional Supplements (FEEDING SUPPLEMENT, JEVITY 1.5 CAL/FIBER,) LIQD Place 1,000 mLs into feeding tube continuous.   Marland Kitchen oxyCODONE-acetaminophen (PERCOCET/ROXICET) 5-325 MG tablet Take 1 tablet by mouth daily as needed for severe pain.   . pantoprazole sodium (PROTONIX) 40 mg/20 mL PACK Place 40 mg into feeding tube 2 (two) times daily.   . predniSONE (DELTASONE) 5 MG tablet Take 5 mg by mouth daily.  12/16/2015: Patient reports now on 5 mg daily   . promethazine (PHENERGAN) 12.5 MG tablet Take 0.5-1 tablets (6.25-12.5 mg total) by mouth every 4 (four) hours as needed for nausea or vomiting. Reported on 10/29/2015   . sucralfate (CARAFATE) 1 G tablet Take 1 tablet (1 g total) by mouth 4 (four) times daily -  with meals and at bedtime.   Marland Kitchen SYNTHROID 150 MCG tablet Take 1 tablet (150 mcg total) by mouth daily.   . Tadalafil, PAH, (ADCIRCA) 20 MG TABS Take 40 mg by mouth daily.   . Water For Irrigation, Sterile (FREE WATER) SOLN Place 150 mLs into feeding tube 2 (two) times daily.    Facility-Administered Encounter Medications as of 02/02/2016  Medication  . iohexol (OMNIPAQUE) 180 MG/ML injection 20 mL  . lactated ringers infusion 1,000 mL  . lactated ringers infusion 1,000 mL  . lidocaine (PF) (XYLOCAINE) 1 % injection 5 mL  . lidocaine (PF) (XYLOCAINE) 1 % injection 5 mL  . midazolam (VERSED) 5 MG/5ML injection 5 mg  . midazolam (VERSED) 5 MG/5ML injection 5 mg  . ropivacaine (PF) 2 mg/ml (0.2%) (NAROPIN) epidural 10 mL  . ropivacaine (PF) 2 mg/ml (0.2%) (NAROPIN) epidural 10 mL  . sodium chloride flush (NS) 0.9 % injection 10 mL  . sodium chloride flush (NS) 0.9 % injection 10 mL  . triamcinolone acetonide (KENALOG-40) injection 40 mg  . triamcinolone acetonide (KENALOG-40) injection 40 mg    Functional Status:  In your present state of health, do you have any difficulty performing the following activities: 02/02/2016 01/06/2016  Hearing? N -  Vision? Y -  Difficulty concentrating or making decisions? N -  Walking or climbing stairs? Y -  Dressing or bathing? N -  Doing errands, shopping? Y -  Quarry manager and eating ? N N  Using the Toilet? N N  In the past six months, have you accidently leaked urine? N N  Do you have problems with loss of bowel control? N N  Managing your Medications? N N  Managing your Finances? N N  Housekeeping or managing your Housekeeping? [name]    Fall/Depression Screening: PHQ 2/9 Scores  02/02/2016 01/25/2016 01/06/2016 12/11/2015 11/18/2015 11/10/2015 10/29/2015  PHQ - 2 Score 0 - 0 0 0 0 -  Exception Documentation - Patient refusal - - Patient refusal - Patient refusal   THN CM Care Plan Problem One        Most Recent Value   Care Plan Problem One  Knowledge deficit in self management of congestive heart falure   Role Documenting the Problem One  Health Coach   THN Long Term Goal (31-90 days)  Patient will not have any admissions for CHF within the next 90 days   THN Long Term Goal Start Date  02/02/16 [continue]   Interventions for Problem One Long Term Goal  RN reminded patient to keep appointments with PCP and other physician that patient is seeing. Patient reminded the importance of taking medications as per order. RN  Health Coach will monitor patient monthly telephonic.Patient is also following up with wellness checks   THN CM Short Term Goal #1 (0-30 days)  Patient will be able to verbalize 3 signs ans symptoms of CHF within the next 30 days   THN CM Short Term Goal #1 Start Date  02/02/16 [continue to reiterate zones. Patient calls in the good one.]   Interventions for Short Term Goal #1  RN sent educational material on Congestive heart failure zones. RN will send a large magnet for CHF to place on refrigerator. RN will follow up with discussion and teachback.   THN CM Short Term Goal #2 (0-30 days)  Patient will weigh daily and record in log within the next 30 days   THN CM Short Term Goal #2 Start Date  02/02/16 [continue to reiterate. Patient is weighing most of the time.]   Interventions for Short Term Goal #2  RN Health Coach  will send patient a calendar book for documentation.  RN discussed the importance of weighing same time each day.RN discussed the importance of weighing and recording any weight gain of 3 pounds in a day or 5 pounds in a week.  Patient is to notify doctor.      Assessment: Patient will continue to  benefit from Health Coach telephonic outreach for  education and support for congestive heart failure self management.  Plan:   RN will follow up outreach within a month to reiterate congestive heart failure zones and action plan RN will follow up with support and monthly outreach till surgery. RN will follow up with discussion on falls prevention with increase narcotics for back pain  Gean Maidens BSN RN [facility] Care Management [phone]

## 2016-02-03 LAB — T4 AND TSH
T4, Total: 6.7 ug/dL (ref 4.5–12.0)
TSH: 19.28 u[IU]/mL — AB (ref 0.450–4.500)

## 2016-02-04 ENCOUNTER — Inpatient Hospital Stay (HOSPITAL_BASED_OUTPATIENT_CLINIC_OR_DEPARTMENT_OTHER): Payer: Medicare Other | Admitting: Family Medicine

## 2016-02-04 ENCOUNTER — Other Ambulatory Visit: Payer: Self-pay | Admitting: Internal Medicine

## 2016-02-04 ENCOUNTER — Ambulatory Visit: Payer: Medicare Other | Admitting: Oncology

## 2016-02-04 ENCOUNTER — Inpatient Hospital Stay: Payer: Medicare Other | Attending: Family Medicine

## 2016-02-04 ENCOUNTER — Other Ambulatory Visit: Payer: Medicare Other

## 2016-02-04 VITALS — BP 125/71 | HR 82 | Temp 98.2°F | Resp 19 | Wt 119.7 lb

## 2016-02-04 DIAGNOSIS — K449 Diaphragmatic hernia without obstruction or gangrene: Secondary | ICD-10-CM | POA: Diagnosis not present

## 2016-02-04 DIAGNOSIS — K219 Gastro-esophageal reflux disease without esophagitis: Secondary | ICD-10-CM | POA: Insufficient documentation

## 2016-02-04 DIAGNOSIS — M329 Systemic lupus erythematosus, unspecified: Secondary | ICD-10-CM | POA: Insufficient documentation

## 2016-02-04 DIAGNOSIS — I509 Heart failure, unspecified: Secondary | ICD-10-CM | POA: Diagnosis not present

## 2016-02-04 DIAGNOSIS — Z87891 Personal history of nicotine dependence: Secondary | ICD-10-CM | POA: Insufficient documentation

## 2016-02-04 DIAGNOSIS — Z923 Personal history of irradiation: Secondary | ICD-10-CM

## 2016-02-04 DIAGNOSIS — C50412 Malignant neoplasm of upper-outer quadrant of left female breast: Secondary | ICD-10-CM

## 2016-02-04 DIAGNOSIS — I10 Essential (primary) hypertension: Secondary | ICD-10-CM | POA: Diagnosis not present

## 2016-02-04 DIAGNOSIS — E039 Hypothyroidism, unspecified: Secondary | ICD-10-CM | POA: Insufficient documentation

## 2016-02-04 DIAGNOSIS — G629 Polyneuropathy, unspecified: Secondary | ICD-10-CM | POA: Insufficient documentation

## 2016-02-04 DIAGNOSIS — E785 Hyperlipidemia, unspecified: Secondary | ICD-10-CM | POA: Insufficient documentation

## 2016-02-04 DIAGNOSIS — E119 Type 2 diabetes mellitus without complications: Secondary | ICD-10-CM | POA: Insufficient documentation

## 2016-02-04 DIAGNOSIS — Z79811 Long term (current) use of aromatase inhibitors: Secondary | ICD-10-CM

## 2016-02-04 DIAGNOSIS — Z170 Estrogen receptor positive status [ER+]: Secondary | ICD-10-CM | POA: Diagnosis not present

## 2016-02-04 LAB — CBC WITH DIFFERENTIAL/PLATELET
Basophils Absolute: 0.1 10*3/uL (ref 0–0.1)
Basophils Relative: 1 %
EOS ABS: 0.1 10*3/uL (ref 0–0.7)
EOS PCT: 1 %
HCT: 36.6 % (ref 35.0–47.0)
Hemoglobin: 12 g/dL (ref 12.0–16.0)
LYMPHS ABS: 1.2 10*3/uL (ref 1.0–3.6)
Lymphocytes Relative: 10 %
MCH: 31.8 pg (ref 26.0–34.0)
MCHC: 32.8 g/dL (ref 32.0–36.0)
MCV: 97 fL (ref 80.0–100.0)
MONOS PCT: 7 %
Monocytes Absolute: 0.9 10*3/uL (ref 0.2–0.9)
Neutro Abs: 9.5 10*3/uL — ABNORMAL HIGH (ref 1.4–6.5)
Neutrophils Relative %: 81 %
PLATELETS: 307 10*3/uL (ref 150–440)
RBC: 3.77 MIL/uL — ABNORMAL LOW (ref 3.80–5.20)
RDW: 15.5 % — ABNORMAL HIGH (ref 11.5–14.5)
WBC: 11.7 10*3/uL — AB (ref 3.6–11.0)

## 2016-02-04 LAB — COMPREHENSIVE METABOLIC PANEL
ALK PHOS: 96 U/L (ref 38–126)
ALT: 24 U/L (ref 14–54)
AST: 31 U/L (ref 15–41)
Albumin: 3.8 g/dL (ref 3.5–5.0)
Anion gap: 8 (ref 5–15)
BUN: 37 mg/dL — AB (ref 6–20)
CALCIUM: 8.9 mg/dL (ref 8.9–10.3)
CO2: 28 mmol/L (ref 22–32)
CREATININE: 1.05 mg/dL — AB (ref 0.44–1.00)
Chloride: 102 mmol/L (ref 101–111)
GFR, EST AFRICAN AMERICAN: 57 mL/min — AB (ref >60)
GFR, EST NON AFRICAN AMERICAN: 49 mL/min — AB (ref >60)
Glucose, Bld: 145 mg/dL — ABNORMAL HIGH (ref 65–99)
Potassium: 3.9 mmol/L (ref 3.5–5.1)
Sodium: 138 mmol/L (ref 135–145)
Total Bilirubin: 0.5 mg/dL (ref 0.3–1.2)
Total Protein: 7.2 g/dL (ref 6.5–8.1)

## 2016-02-04 MED ORDER — LETROZOLE 2.5 MG PO TABS: 2.5000 mg | ORAL_TABLET | Freq: Every day | ORAL | Status: DC

## 2016-02-04 NOTE — Progress Notes (Signed)
[facility]  [phone]) [phone]  [phone]     Diana [name] DOB: 1934/09/26  MR#: [id number]  NGE#:[id number]  Patient Care Team: Sherlene Shams, MD as PCP - General (Internal Medicine) Earline Mayotte, MD (General Surgery) Darci Current, MD as Attending Physician (Emergency Medicine) Luella Cook, RN as [facility] Care Management  CHIEF COMPLAINT:  Chief Complaint  Patient presents with  . Follow-up    Breast Cancer    INTERVAL HISTORY:  Patient is here for further follow-up regarding carcinoma of left breast diagnosed in September 2015. Patient is currently on letrozole, calcium, and vitamin D and tolerating very well. She reports occasional hot flashes but no other complaints. Her most recent mammogram was performed in January 2017 and reported as BI-RADS 2, benign. She has chronic debilitating back pain that she sees Dr. Pernell Dupre for. Patient overall feels very well and denies any other acute complaints   REVIEW OF SYSTEMS:   Review of Systems  Constitutional: Negative for fever, chills, weight loss, malaise/fatigue and diaphoresis.  HENT: Negative.   Eyes: Negative.   Respiratory: Negative for cough, hemoptysis, sputum production, shortness of breath and wheezing.   Cardiovascular: Negative for chest pain, palpitations, orthopnea, claudication, leg swelling and PND.  Gastrointestinal: Negative for heartburn, nausea, vomiting, abdominal pain, diarrhea, constipation, blood in stool and melena.  Genitourinary: Negative.   Musculoskeletal: Positive for back pain.       Chronic back pain, followed by Dr. Pernell Dupre  Skin: Negative.   Neurological: Negative for dizziness, tingling, focal weakness, seizures and weakness.  Endo/Heme/Allergies: Does not bruise/bleed easily.  Psychiatric/Behavioral: Negative for depression. The patient is not nervous/anxious and does not have insomnia.     As per HPI. Otherwise, a complete review of  systems is negatve.  ONCOLOGY HISTORY: Oncology History   81 year old female with stage I (T1 C. N0 M0) invasive mammary carcinoma status post wide local excision and sentinel node biopsy tumor is ER/PR positive HER-2/neu not over expressed with a low Oncotype DX score of 17 for adjuvant whole breast radiation Oncotype DX score is 17 Patient has finished radiation therapy in December of 2015 Started letrozole from December, 2015     Breast cancer of upper-outer quadrant of left female breast (HCC)   05/26/2014 Initial Diagnosis Breast cancer of upper-outer quadrant of left female breast (HCC), stage I, T1 cN0 M0, ER/PR positive HER-2/neu not overexpressed.   06/06/2014 Oncotype testing Oncotype DX score 17    - 09/05/2014 Radiation Therapy    09/05/2014 -  Anti-estrogen oral therapy Started letrozole    PAST MEDICAL HISTORY: Past Medical History  Diagnosis Date  . Lupus (systemic lupus erythematosus) (HCC)   . Pulmonary hypertension (HCC)   . Pneumonia   . Unspecified menopausal and postmenopausal disorder   . Primary pulmonary hypertension (HCC)   . Acute glomerulonephritis with other specified pathological lesion in kidney in disease classified elsewhere(580.81)   . Unspecified hypothyroidism   . Shingles (herpes zoster) polyneuropathy March 2013    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  . Hiatal hernia   . DVT (deep venous thrombosis) (HCC)   . Cancer (HCC)     Breast  . CHF (congestive heart failure) (HCC)   . Renal insufficiency   . Hypertension   . Shingles   . Hyperlipidemia   . Alveolar aeration decreased   . Vitamin B deficiency   . Diaphragmatic hernia   . Esophageal reflux   . Peripheral neuropathy, hereditary/idiopathic   .  Sciatica   . Diabetes mellitus without complication (HCC)   . Urinary incontinence without sensory awareness   . Breast cancer (HCC) 06/03/2014    positive, radiation    PAST SURGICAL HISTORY: Past Surgical History  Procedure Laterality Date   . Kyphoplasty N/A 02/10/2015    Procedure: [id number];  Surgeon: Kennedy Bucker, MD;  Location: [facility] ORS;  Service: Orthopedics;  Laterality: N/A;  . Laparotomy N/A 03/11/2015    Procedure: REDUCTION OF GASTRIC VOLVULUS, SPLEENECTOMY, GASTRIC TUBE PLACEMENT;  Surgeon: Earline Mayotte, MD;  Location: [facility] ORS;  Service: General;  Laterality: N/A;  . Esophagogastroduodenoscopy (egd) with propofol N/A 03/28/2015    Procedure: ESOPHAGOGASTRODUODENOSCOPY (EGD) WITH PROPOFOL;  Surgeon: Earline Mayotte, MD;  Location: [facility] ENDOSCOPY;  Service: Endoscopy;  Laterality: N/A;  . Esophagogastroduodenoscopy (egd) with propofol N/A 06/10/2015    Procedure: ESOPHAGOGASTRODUODENOSCOPY (EGD) WITH PROPOFOL;  Surgeon: Christena Deem, MD;  Location: [facility] ENDOSCOPY;  Service: Endoscopy;  Laterality: N/A;  . Peripheral vascular catheterization N/A 06/12/2015    Procedure: IVC Filter Insertion;  Surgeon: Annice Needy, MD;  Location: [facility] INVASIVE CV LAB;  Service: Cardiovascular;  Laterality: N/A;  . Esophagogastroduodenoscopy N/A 07/19/2015    Procedure: ESOPHAGOGASTRODUODENOSCOPY (EGD);  Surgeon: Wallace Cullens, MD;  Location: [facility] ENDOSCOPY;  Service: Gastroenterology;  Laterality: N/A;  . Breast excisional biopsy Left 06/03/2014    positive  . Diagnostic laparoscopy    . Stomach surgery      for volvolus  . Esophagogastroduodenoscopy (egd) with propofol N/A 10/20/2015    Procedure: ESOPHAGOGASTRODUODENOSCOPY (EGD) WITH PROPOFOL;  Surgeon: Christena Deem, MD;  Location: [facility] ENDOSCOPY;  Service: Endoscopy;  Laterality: N/A;    FAMILY HISTORY Family History  Problem Relation Age of Onset  . Colon cancer Mother   . Alcohol abuse Sister   . Breast cancer Sister 72  . Cancer Brother   . Lung cancer Son     GYNECOLOGIC HISTORY:  No LMP recorded. Patient is postmenopausal.     ADVANCED DIRECTIVES:    HEALTH MAINTENANCE: Social History  Substance Use Topics  . Smoking status: Former Smoker    Quit  date: 05/25/1991  . Smokeless tobacco: Never Used  . Alcohol Use: Yes     Comment: rare      Mammogram:January 2017   Allergies  Allergen Reactions  . Amoxicillin Other (See Comments)    Reaction:  Stomach cramps   . Cefuroxime Itching  . Contrast Media [Iodinated Diagnostic Agents] Hives  . Metrizamide   . Morphine And Related     Reaction: face flushed    . Propoxyphene   . Cephalexin Rash  . Sulfa Antibiotics Rash  . Sulfonamide Derivatives Rash  . Tramadol Hcl Rash  . Venlafaxine Rash    Current Outpatient Prescriptions  Medication Sig Dispense Refill  . acetaminophen (TYLENOL) 325 MG tablet Take 650 mg by mouth every 4 (four) hours as needed for mild pain or fever.    . ALPRAZolam (XANAX) 0.25 MG tablet Take 1 tablet (0.25 mg total) by mouth at bedtime as needed for anxiety. 20 tablet 0  . azelastine (ASTELIN) 0.1 % nasal spray Place 1 spray into both nostrils 2 (two) times daily. Reported on 01/25/2016    . budesonide-formoterol (SYMBICORT) 160-4.5 MCG/ACT inhaler Inhale 2 puffs into the lungs 2 (two) times daily. Reported on 12/16/2015    . cholecalciferol (VITAMIN D) 400 UNITS TABS tablet Take 400 Units by mouth.    . cyanocobalamin (,VITAMIN B-12,) 1000 MCG/ML injection Inject 1  mL (1,000 mcg total) into the muscle every 30 (thirty) days. Pt uses on the 1st of every month. 10 mL 1  . Fluticasone-Salmeterol (ADVAIR) 250-50 MCG/DOSE AEPB Inhale 1 puff into the lungs 2 (two) times daily. Reported on 12/16/2015    . furosemide (LASIX) 20 MG tablet Take 20 mg by mouth daily as needed.     . hydroxychloroquine (PLAQUENIL) 200 MG tablet Take 200 mg by mouth daily.    Marland Kitchen letrozole (FEMARA) 2.5 MG tablet Take 1 tablet (2.5 mg total) by mouth daily. 90 tablet 3  . levocetirizine (XYZAL) 5 MG tablet Take 5 mg by mouth daily.    . metoCLOPramide (REGLAN) 10 MG tablet Take 1 tablet (10 mg total) by mouth 4 (four) times daily -  before meals and at bedtime. 120 tablet 5  . metoprolol  succinate (TOPROL-XL) 25 MG 24 hr tablet Take 25 mg by mouth daily.    . mycophenolate (CELLCEPT) 500 MG tablet Take 1,000 mg by mouth 2 (two) times daily.    . Nutritional Supplements (FEEDING SUPPLEMENT, JEVITY 1.5 CAL/FIBER,) LIQD Place 1,000 mLs into feeding tube continuous. 30000 mL 1  . oxyCODONE-acetaminophen (PERCOCET/ROXICET) 5-325 MG tablet Take 1 tablet by mouth daily as needed for severe pain. 90 tablet 0  . pantoprazole sodium (PROTONIX) 40 mg/20 mL PACK Place 40 mg into feeding tube 2 (two) times daily.    . predniSONE (DELTASONE) 5 MG tablet Take 5 mg by mouth daily.     . promethazine (PHENERGAN) 12.5 MG tablet Take 0.5-1 tablets (6.25-12.5 mg total) by mouth every 4 (four) hours as needed for nausea or vomiting. Reported on 10/29/2015 45 tablet 4  . sucralfate (CARAFATE) 1 G tablet Take 1 tablet (1 g total) by mouth 4 (four) times daily -  with meals and at bedtime. 120 tablet 0  . SYNTHROID 150 MCG tablet TAKE 1 TABLET BY MOUTH EVERY DAY 30 tablet 4  . Tadalafil, PAH, (ADCIRCA) 20 MG TABS Take 40 mg by mouth daily.    . Water For Irrigation, Sterile (FREE WATER) SOLN Place 150 mLs into feeding tube 2 (two) times daily. 1500 mL 2  . chlorpheniramine-HYDROcodone (TUSSIONEX PENNKINETIC ER) 10-8 MG/5ML SUER Take 5 mLs by mouth at bedtime as needed for cough. (Patient not taking: Reported on 02/04/2016) 140 mL 0  . levofloxacin (LEVAQUIN) 500 MG tablet Take 1 tablet (500 mg total) by mouth daily. (Patient not taking: Reported on 10/20/2015) 7 tablet 0  . loperamide (IMODIUM) 2 MG capsule Take 2-4 mg by mouth as needed for diarrhea or loose stools. Reported on 02/04/2016     Current Facility-Administered Medications  Medication Dose Route Frequency Provider Last Rate Last Dose  . iohexol (OMNIPAQUE) 180 MG/ML injection 20 mL  20 mL Other Once PRN Yevette Edwards, MD      . lactated ringers infusion 1,000 mL  1,000 mL Intravenous Continuous Yevette Edwards, MD      . lactated ringers infusion  1,000 mL  1,000 mL Intravenous Continuous Yevette Edwards, MD      . lidocaine (PF) (XYLOCAINE) 1 % injection 5 mL  5 mL Subcutaneous Once Yevette Edwards, MD      . lidocaine (PF) (XYLOCAINE) 1 % injection 5 mL  5 mL Subcutaneous Once Yevette Edwards, MD      . midazolam (VERSED) 5 MG/5ML injection 5 mg  5 mg Intravenous Once Yevette Edwards, MD      . midazolam (VERSED) 5 MG/5ML  injection 5 mg  5 mg Intravenous Once Yevette Edwards, MD      . ropivacaine (PF) 2 mg/ml (0.2%) (NAROPIN) epidural 10 mL  10 mL Epidural Once Yevette Edwards, MD      . ropivacaine (PF) 2 mg/ml (0.2%) (NAROPIN) epidural 10 mL  10 mL Epidural Once Yevette Edwards, MD      . sodium chloride flush (NS) 0.9 % injection 10 mL  10 mL Other Once Yevette Edwards, MD      . sodium chloride flush (NS) 0.9 % injection 10 mL  10 mL Other Once Yevette Edwards, MD      . triamcinolone acetonide (KENALOG-40) injection 40 mg  40 mg Other Once Yevette Edwards, MD      . triamcinolone acetonide (KENALOG-40) injection 40 mg  40 mg Other Once Yevette Edwards, MD        OBJECTIVE: BP 125/71 mmHg  Pulse 82  Temp(Src) 98.2 F (36.8 C)  Resp 19  Wt 119 lb 11.4 oz (54.3 kg)   Body mass index is 21.21 kg/(m^2).    ECOG FS:1 - Symptomatic but completely ambulatory  General: Frail, thin elderly female, no acute distress. Eyes: Pink conjunctiva, anicteric sclera. HEENT: Normocephalic, moist mucous membranes, clear oropharnyx. Lungs: Clear to auscultation bilaterally. Heart: Regular rate and rhythm. No rubs, murmurs, or gallops. Abdomen: Soft, nontender, nondistended. No organomegaly noted, normoactive bowel sounds. Patient has PEG tube. Breast: Breast palpated in a circular manner in the sitting and supine positions.  No masses or fullness palpated.  Axilla palpated in both positions with no masses or fullness palpated.  Musculoskeletal: No edema, cyanosis, or clubbing. Neuro: Alert, answering all questions appropriately. Cranial nerves grossly intact. Skin:  No rashes or petechiae noted. Psych: Normal affect. Lymphatics: No cervical, clavicular, or axillary LAD.   LAB RESULTS:  Appointment on 02/04/2016  Component Date Value Ref Range Status  . WBC 02/04/2016 11.7* 3.6 - 11.0 K/uL Final  . RBC 02/04/2016 3.77* 3.80 - 5.20 MIL/uL Final  . Hemoglobin 02/04/2016 12.0  12.0 - 16.0 g/dL Final  . HCT 16/06/9603 36.6  35.0 - 47.0 % Final  . MCV 02/04/2016 97.0  80.0 - 100.0 fL Final  . MCH 02/04/2016 31.8  26.0 - 34.0 pg Final  . MCHC 02/04/2016 32.8  32.0 - 36.0 g/dL Final  . RDW 54/05/8118 15.5* 11.5 - 14.5 % Final  . Platelets 02/04/2016 307  150 - 440 K/uL Final  . Neutrophils Relative % 02/04/2016 81   Final  . Neutro Abs 02/04/2016 9.5* 1.4 - 6.5 K/uL Final  . Lymphocytes Relative 02/04/2016 10   Final  . Lymphs Abs 02/04/2016 1.2  1.0 - 3.6 K/uL Final  . Monocytes Relative 02/04/2016 7   Final  . Monocytes Absolute 02/04/2016 0.9  0.2 - 0.9 K/uL Final  . Eosinophils Relative 02/04/2016 1   Final  . Eosinophils Absolute 02/04/2016 0.1  0 - 0.7 K/uL Final  . Basophils Relative 02/04/2016 1   Final  . Basophils Absolute 02/04/2016 0.1  0 - 0.1 K/uL Final  . Sodium 02/04/2016 138  135 - 145 mmol/L Final  . Potassium 02/04/2016 3.9  3.5 - 5.1 mmol/L Final  . Chloride 02/04/2016 102  101 - 111 mmol/L Final  . CO2 02/04/2016 28  22 - 32 mmol/L Final  . Glucose, Bld 02/04/2016 145* 65 - 99 mg/dL Final  . BUN 14/78/2956 37* 6 - 20 mg/dL Final  . Creatinine, Ser 02/04/2016  1.05* 0.44 - 1.00 mg/dL Final  . Calcium 65/78/4696 8.9  8.9 - 10.3 mg/dL Final  . Total Protein 02/04/2016 7.2  6.5 - 8.1 g/dL Final  . Albumin 29/52/8413 3.8  3.5 - 5.0 g/dL Final  . AST 24/40/1027 31  15 - 41 U/L Final  . ALT 02/04/2016 24  14 - 54 U/L Final  . Alkaline Phosphatase 02/04/2016 96  38 - 126 U/L Final  . Total Bilirubin 02/04/2016 0.5  0.3 - 1.2 mg/dL Final  . GFR calc non Af Amer 02/04/2016 49* >60 mL/min Final  . GFR calc Af Amer 02/04/2016 57* >60  mL/min Final   Comment: (NOTE) The eGFR has been calculated using the CKD EPI equation. This calculation has not been validated in all clinical situations. eGFR's persistently <60 mL/min signify possible Chronic Kidney Disease.   . Anion gap 02/04/2016 8  5 - 15 Final    STUDIES: No results found.  ASSESSMENT:  Carcinoma of left breast, stage I a, T1 cN0 M0, ER/PR positive HER-2/neu not overexpressed.   PLAN:    1. Left breast cancer. Patient is status post wide local excision and sentinel node biopsy as well as completion of whole breast radiation from 2015. Patient is currently tolerating letrozole, calcium, and vitamin D very well. Her most recent mammogram in January 2017 was reported as BI-RADS 2, negative. Clinically there is no evidence of recurrent or progressive disease.   We'll continue with routine follow-up in approximately 6 months.  Patient expressed understanding and was in agreement with this plan. She also understands that She can call clinic at any time with any questions, concerns, or complaints.   Dr. Doylene Canning was available for consultation and review of plan of care for this patient.  Breast cancer of upper-outer quadrant of left female breast [facility])   Staging form: Breast, AJCC 7th Edition     Clinical stage from 14-Feb-202015: Stage IA (T1c, N0, M0) - Signed by Loann Quill, NP on 02/04/2016   Loann Quill, NP   02/04/2016 2:03 PM

## 2016-02-04 NOTE — Progress Notes (Signed)
Patient here for ongoing evaluation for Breast cancer. States she needs a refill for femara today. Pt does get hot flashes from the femara. Pt has a feeding tube. Reports she has an inflammed esophagus and cannot swallow food. She states her son gives her the feedings. She has chronic back pain daily. Pt is A/O x 3 . She is by herself for the visit.

## 2016-02-05 ENCOUNTER — Telehealth: Payer: Self-pay | Admitting: Internal Medicine

## 2016-02-05 NOTE — Telephone Encounter (Signed)
Patient advised.

## 2016-02-05 NOTE — Telephone Encounter (Signed)
Yes, you are correct .  Continue previous dose and take it 30 minutes prior to tube feeds, which count as food.

## 2016-02-05 NOTE — Telephone Encounter (Addendum)
Called patient regarding labs patient advised nurse that she had been advised to increase synthroid on Sunday to 150 mcg, patient reported to this nurse that she has been taking her synthroid with food advised patient she should take 30 minutes prior to break fast on an empty stomach. MD stated in lab note to find out how patient was taking synthroid and adjust medication accordingly Right?  Patient is on liquid nutrition through peg tube patient taking synthroid with this nutrition which would be like food patient using jevity?

## 2016-02-14 ENCOUNTER — Other Ambulatory Visit: Payer: Self-pay | Admitting: Oncology

## 2016-02-18 ENCOUNTER — Telehealth: Payer: Self-pay

## 2016-02-18 NOTE — Telephone Encounter (Signed)
Called to patient to let her know that I don't think having 2 procedures in 2 day will be a problem but that I will speak to Dr Andree Elk about it tomorrow.  Patient verbalizes u/o information.

## 2016-02-18 NOTE — Telephone Encounter (Signed)
Pt is having an endoscopy procedure pt wants to know is it okay for her to be put to sleep 2 days in a row..wants someone to call her

## 2016-02-19 NOTE — Telephone Encounter (Signed)
Spoke with Dr Andree Elk, okay to have procedure in office following endoscopy the day before.

## 2016-02-23 ENCOUNTER — Ambulatory Visit
Admission: RE | Admit: 2016-02-23 | Discharge: 2016-02-23 | Disposition: A | Discharge status: Home / Self Care | Payer: Medicare Other | Source: Ambulatory Visit | Attending: Gastroenterology | Admitting: Gastroenterology

## 2016-02-23 ENCOUNTER — Ambulatory Visit: Payer: Medicare Other | Admitting: Anesthesiology

## 2016-02-23 ENCOUNTER — Encounter
Admission: RE | Disposition: A | Discharge status: Home / Self Care | Payer: Self-pay | Source: Ambulatory Visit | Attending: Gastroenterology

## 2016-02-23 DIAGNOSIS — Z79899 Other long term (current) drug therapy: Secondary | ICD-10-CM | POA: Insufficient documentation

## 2016-02-23 DIAGNOSIS — E785 Hyperlipidemia, unspecified: Secondary | ICD-10-CM | POA: Diagnosis not present

## 2016-02-23 DIAGNOSIS — Z862 Personal history of diseases of the blood and blood-forming organs and certain disorders involving the immune mechanism: Secondary | ICD-10-CM | POA: Diagnosis not present

## 2016-02-23 DIAGNOSIS — Z853 Personal history of malignant neoplasm of breast: Secondary | ICD-10-CM | POA: Insufficient documentation

## 2016-02-23 DIAGNOSIS — M329 Systemic lupus erythematosus, unspecified: Secondary | ICD-10-CM | POA: Insufficient documentation

## 2016-02-23 DIAGNOSIS — K209 Esophagitis, unspecified: Secondary | ICD-10-CM | POA: Diagnosis not present

## 2016-02-23 DIAGNOSIS — I509 Heart failure, unspecified: Secondary | ICD-10-CM | POA: Insufficient documentation

## 2016-02-23 DIAGNOSIS — Z87891 Personal history of nicotine dependence: Secondary | ICD-10-CM | POA: Insufficient documentation

## 2016-02-23 DIAGNOSIS — I110 Hypertensive heart disease with heart failure: Secondary | ICD-10-CM | POA: Diagnosis not present

## 2016-02-23 DIAGNOSIS — K208 Other esophagitis: Secondary | ICD-10-CM | POA: Diagnosis not present

## 2016-02-23 DIAGNOSIS — G709 Myoneural disorder, unspecified: Secondary | ICD-10-CM | POA: Diagnosis not present

## 2016-02-23 DIAGNOSIS — I739 Peripheral vascular disease, unspecified: Secondary | ICD-10-CM | POA: Insufficient documentation

## 2016-02-23 DIAGNOSIS — K219 Gastro-esophageal reflux disease without esophagitis: Secondary | ICD-10-CM | POA: Diagnosis not present

## 2016-02-23 DIAGNOSIS — K449 Diaphragmatic hernia without obstruction or gangrene: Secondary | ICD-10-CM | POA: Diagnosis not present

## 2016-02-23 DIAGNOSIS — Z86718 Personal history of other venous thrombosis and embolism: Secondary | ICD-10-CM | POA: Insufficient documentation

## 2016-02-23 DIAGNOSIS — K2210 Ulcer of esophagus without bleeding: Secondary | ICD-10-CM | POA: Diagnosis present

## 2016-02-23 DIAGNOSIS — E119 Type 2 diabetes mellitus without complications: Secondary | ICD-10-CM | POA: Diagnosis not present

## 2016-02-23 DIAGNOSIS — E539 Vitamin B deficiency, unspecified: Secondary | ICD-10-CM | POA: Diagnosis not present

## 2016-02-23 DIAGNOSIS — D649 Anemia, unspecified: Secondary | ICD-10-CM | POA: Diagnosis not present

## 2016-02-23 DIAGNOSIS — Z8711 Personal history of peptic ulcer disease: Secondary | ICD-10-CM | POA: Insufficient documentation

## 2016-02-23 DIAGNOSIS — E039 Hypothyroidism, unspecified: Secondary | ICD-10-CM | POA: Diagnosis not present

## 2016-02-23 DIAGNOSIS — B259 Cytomegaloviral disease, unspecified: Secondary | ICD-10-CM | POA: Diagnosis not present

## 2016-02-23 HISTORY — PX: ESOPHAGOGASTRODUODENOSCOPY (EGD) WITH PROPOFOL: SHX5813

## 2016-02-23 SURGERY — ESOPHAGOGASTRODUODENOSCOPY (EGD) WITH PROPOFOL
Anesthesia: General

## 2016-02-23 MED ORDER — LIDOCAINE HCL (CARDIAC) 20 MG/ML IV SOLN
INTRAVENOUS | Status: DC | PRN
  Administered 2016-02-23: 60 mg via INTRAVENOUS

## 2016-02-23 MED ORDER — SODIUM CHLORIDE 0.9 % IV SOLN: INTRAVENOUS | Status: DC

## 2016-02-23 MED ORDER — PROPOFOL 500 MG/50ML IV EMUL
INTRAVENOUS | Status: DC | PRN
  Administered 2016-02-23: 75 ug/kg/min via INTRAVENOUS

## 2016-02-23 MED ORDER — PROPOFOL 10 MG/ML IV BOLUS
INTRAVENOUS | Status: DC | PRN
  Administered 2016-02-23: 10 mg via INTRAVENOUS
  Administered 2016-02-23: 20 mg via INTRAVENOUS
  Administered 2016-02-23: 30 mg via INTRAVENOUS
  Administered 2016-02-23: 10 mg via INTRAVENOUS

## 2016-02-23 MED ORDER — SODIUM CHLORIDE 0.9 % IV SOLN
INTRAVENOUS | Status: DC
  Administered 2016-02-23 (×2): via INTRAVENOUS

## 2016-02-23 NOTE — Anesthesia Procedure Notes (Signed)
Date/Time: 02/23/2016 1:32 PM Performed by: Nelda Marseille Pre-anesthesia Checklist: Patient identified, Emergency Drugs available, Suction available, Patient being monitored and Timeout performed Oxygen Delivery Method: Nasal cannula

## 2016-02-23 NOTE — Op Note (Signed)
[facility] Gastroenterology Patient Name: Diana [name] Procedure Date: 02/23/2016 1:12 PM MRN: [id number] Account #: [id number] Date of Birth: 1934-10-24 Admit Type: Outpatient Age: 81 Room: [facility] ENDO ROOM 2 Gender: Female Note Status: Finalized Procedure:            Upper GI endoscopy Indications:          Diagnostic procedure, Follow-up of esophagitis Providers:            Christena Deem, MD Referring MD:         Duncan Dull, MD (Referring MD) Medicines:            Monitored Anesthesia Care Complications:        No immediate complications. Procedure:            Pre-Anesthesia Assessment:                       - ASA Grade Assessment: III - A patient with severe                        systemic disease.                       After obtaining informed consent, the endoscope was                        passed under direct vision. Throughout the procedure,                        the patient's blood pressure, pulse, and oxygen                        saturations were monitored continuously. The Endoscope                        was introduced through the mouth, and advanced to the                        third part of duodenum. The upper GI endoscopy was                        performed with moderate difficulty due to post-surgical                        anatomy. The patient tolerated the procedure well. Findings:      LA Grade D (one or more mucosal breaks involving at least 75% of       esophageal circumference) esophagitis with friable was found in the       distal esophagus. There is no open ulceration however there is a mild       eschar 360 degrees in the distal third of the esophagus extending from       the GE junction to about 4-5 cm proximally. This area is very friable,       bleeding readily to biopsy. Biopsy was taken and the site was watched       until there was hemostasis.      Ther scope was passed intiially, before any biopsies, through the   moderate to large hiatal hernia into the distal stomach and into the       duodenum. The orientation of the distal stomach makes an acute  angle to       enter the duodenum from the antrum. There is no evidence of ulceration       in the Gastric vault itlelf. There is an area of papillary mucosa in the       upper cardia which was biopsied. The PEG button is noted in good       position just proximal to the antrum to mid/distal gastric body and not       inflamed.      The examined duodenum was normal.      The exam of the esophagus was otherwise normal. Impression:           - LA Grade D erosive esophagitis.                       - Normal examined duodenum.                       - No specimens collected. Recommendation:       - Discharge patient to home.                       - Continue present medications.                       - Continue current tube feeds.                       - Refer to a surgeon, Dr Rhona Raider at [facility], at appointment                        to be scheduled. Procedure Code(s):    --- Professional ---                       [phone], Esophagogastroduodenoscopy, flexible, transoral;                        diagnostic, including collection of specimen(s) by                        brushing or washing, when performed (separate procedure) Diagnosis Code(s):    --- Professional ---                       K20.8, Other esophagitis CPT copyright 2016 American [employer] Association. All rights reserved. The codes documented in this report are preliminary and upon coder review may  be revised to meet current compliance requirements. Christena Deem, MD 02/23/2016 1:59:22 PM This report has been signed electronically. Number of Addenda: 0 Note Initiated On: 02/23/2016 1:12 PM      [facility]

## 2016-02-23 NOTE — Anesthesia Postprocedure Evaluation (Signed)
Anesthesia Post Note  Patient: Diana [name]  Procedure(s) Performed: Procedure(s) (LRB): ESOPHAGOGASTRODUODENOSCOPY (EGD) WITH PROPOFOL (N/A)  Patient location during evaluation: Endoscopy Anesthesia Type: General Level of consciousness: awake and alert Pain management: pain level controlled Vital Signs Assessment: post-procedure vital signs reviewed and stable Respiratory status: spontaneous breathing and respiratory function stable Cardiovascular status: stable Anesthetic complications: no    Last Vitals:  Filed Vitals:   02/23/16 1245 02/23/16 1400  BP: 138/72 112/72  Pulse: 88 86  Temp: 36.6 C 36.6 C  Resp: 20 15    Last Pain:  Filed Vitals:   02/23/16 1401  PainSc: 6                  Livianna Petraglia K

## 2016-02-23 NOTE — Transfer of Care (Signed)
Immediate Anesthesia Transfer of Care Note  Patient: Diana [name]  Procedure(s) Performed: Procedure(s): ESOPHAGOGASTRODUODENOSCOPY (EGD) WITH PROPOFOL (N/A)  Patient Location: PACU  Anesthesia Type:General  Level of Consciousness: awake, alert  and oriented  Airway & Oxygen Therapy: Patient Spontanous Breathing and Patient connected to nasal cannula oxygen  Post-op Assessment: Report given to RN and Post -op Vital signs reviewed and stable  Post vital signs: Reviewed and stable  Last Vitals:  Filed Vitals:   02/23/16 1245  BP: 138/72  Pulse: 88  Temp: 36.6 C  Resp: 20    Last Pain:  Filed Vitals:   02/23/16 1247  PainSc: 6       Patients Stated Pain Goal: 0 ([id number])  Complications: No apparent anesthesia complications

## 2016-02-23 NOTE — H&P (Signed)
Outpatient short stay form Pre-procedure 02/23/2016 1:13 PM Lollie Sails MD  Primary Physician: Dr Deborra Medina  Reason for visit:  EGD  History of present illness:  Patient presenting today for EGD as above. She has a complex recent medical history and surgical history of a gastric volvulus that occurred acutely and was treated then with asked her pack see. Since that time she has had severe erosive esophagitis necessitating [place] to feeds through a G-tube because the or healing of the esophagus when trying to maintain feeds orally. She states that she has actually gained weight in the past month or so. Have discussed surgical consultation for revision of the gastropexy. Evaluation today for checking on healing of the severe erosive esophagitis she has had.    Current facility-administered medications:  .  0.9 %  sodium chloride infusion, , Intravenous, Continuous, Lollie Sails, MD, Last Rate: 20 mL/hr at 02/23/16 1308 .  0.9 %  sodium chloride infusion, , Intravenous, Continuous, Lollie Sails, MD  Facility-administered medications prior to admission  Medication Dose Route Frequency Provider Last Rate Last Dose  . iohexol (OMNIPAQUE) 180 MG/ML injection 20 mL  20 mL Other Once PRN Molli Barrows, MD      . lactated ringers infusion 1,000 mL  1,000 mL Intravenous Continuous Molli Barrows, MD      . lactated ringers infusion 1,000 mL  1,000 mL Intravenous Continuous Molli Barrows, MD      . lidocaine (PF) (XYLOCAINE) 1 % injection 5 mL  5 mL Subcutaneous Once Molli Barrows, MD      . lidocaine (PF) (XYLOCAINE) 1 % injection 5 mL  5 mL Subcutaneous Once Molli Barrows, MD      . midazolam (VERSED) 5 MG/5ML injection 5 mg  5 mg Intravenous Once Molli Barrows, MD      . midazolam (VERSED) 5 MG/5ML injection 5 mg  5 mg Intravenous Once Molli Barrows, MD      . ropivacaine (PF) 2 mg/ml (0.2%) (NAROPIN) epidural 10 mL  10 mL Epidural Once Molli Barrows, MD      . ropivacaine (PF) 2 mg/ml  (0.2%) (NAROPIN) epidural 10 mL  10 mL Epidural Once Molli Barrows, MD      . sodium chloride flush (NS) 0.9 % injection 10 mL  10 mL Other Once Molli Barrows, MD      . sodium chloride flush (NS) 0.9 % injection 10 mL  10 mL Other Once Molli Barrows, MD      . triamcinolone acetonide (KENALOG-40) injection 40 mg  40 mg Other Once Molli Barrows, MD      . triamcinolone acetonide (KENALOG-40) injection 40 mg  40 mg Other Once Molli Barrows, MD       Prescriptions prior to admission  Medication Sig Dispense Refill Last Dose  . budesonide-formoterol (SYMBICORT) 160-4.5 MCG/ACT inhaler Inhale 2 puffs into the lungs 2 (two) times daily. Reported on 12/16/2015   Past Month at Unknown time  . cholecalciferol (VITAMIN D) 400 UNITS TABS tablet Take 400 Units by mouth.   02/22/2016 at Unknown time  . cyanocobalamin (,VITAMIN B-12,) 1000 MCG/ML injection Inject 1 mL (1,000 mcg total) into the muscle every 30 (thirty) days. Pt uses on the 1st of every month. 10 mL 1 02/22/2016 at Unknown time  . hydroxychloroquine (PLAQUENIL) 200 MG tablet Take 200 mg by mouth daily.   Past Month at Unknown time  . letrozole (  FEMARA) 2.5 MG tablet Take 1 tablet (2.5 mg total) by mouth daily. 90 tablet 3 02/22/2016 at Unknown time  . letrozole (FEMARA) 2.5 MG tablet TAKE 1 TABLET (2.5 MG TOTAL) BY MOUTH DAILY. 45 tablet 0 02/22/2016 at Unknown time  . levocetirizine (XYZAL) 5 MG tablet Take 5 mg by mouth daily.   02/22/2016 at Unknown time  . metoCLOPramide (REGLAN) 10 MG tablet Take 1 tablet (10 mg total) by mouth 4 (four) times daily -  before meals and at bedtime. 120 tablet 5 Past Month at Unknown time  . metoprolol succinate (TOPROL-XL) 25 MG 24 hr tablet Take 25 mg by mouth daily.   02/22/2016 at Unknown time  . mycophenolate (CELLCEPT) 500 MG tablet Take 1,000 mg by mouth 2 (two) times daily.   02/22/2016 at Unknown time  . Nutritional Supplements (FEEDING SUPPLEMENT, JEVITY 1.5 CAL/FIBER,) LIQD Place 1,000 mLs into feeding tube  continuous. 30000 mL 1 02/22/2016 at Unknown time  . oxyCODONE-acetaminophen (PERCOCET/ROXICET) 5-325 MG tablet Take 1 tablet by mouth daily as needed for severe pain. 90 tablet 0 02/22/2016 at Unknown time  . pantoprazole sodium (PROTONIX) 40 mg/20 mL PACK Place 40 mg into feeding tube 2 (two) times daily.   02/22/2016 at Unknown time  . predniSONE (DELTASONE) 5 MG tablet Take 5 mg by mouth daily.    02/22/2016 at Unknown time  . sucralfate (CARAFATE) 1 G tablet Take 1 tablet (1 g total) by mouth 4 (four) times daily -  with meals and at bedtime. 120 tablet 0 02/22/2016 at Unknown time  . SYNTHROID 150 MCG tablet TAKE 1 TABLET BY MOUTH EVERY DAY 30 tablet 4 02/22/2016 at Unknown time  . Tadalafil, PAH, (ADCIRCA) 20 MG TABS Take 40 mg by mouth daily.   02/22/2016 at Unknown time  . Water For Irrigation, Sterile (FREE WATER) SOLN Place 150 mLs into feeding tube 2 (two) times daily. 1500 mL 2 02/22/2016 at Unknown time  . acetaminophen (TYLENOL) 325 MG tablet Take 650 mg by mouth every 4 (four) hours as needed for mild pain or fever. Reported on 02/23/2016   Not Taking at Unknown time  . ALPRAZolam (XANAX) 0.25 MG tablet Take 1 tablet (0.25 mg total) by mouth at bedtime as needed for anxiety. 20 tablet 0 Not Taking at Unknown time  . azelastine (ASTELIN) 0.1 % nasal spray Place 1 spray into both nostrils 2 (two) times daily. Reported on 02/23/2016   Not Taking at Unknown time  . chlorpheniramine-HYDROcodone (TUSSIONEX PENNKINETIC ER) 10-8 MG/5ML SUER Take 5 mLs by mouth at bedtime as needed for cough. 140 mL 0 Not Taking at Unknown time  . Fluticasone-Salmeterol (ADVAIR) 250-50 MCG/DOSE AEPB Inhale 1 puff into the lungs 2 (two) times daily. Reported on 02/23/2016   Not Taking at Unknown time  . levofloxacin (LEVAQUIN) 500 MG tablet Take 1 tablet (500 mg total) by mouth daily. (Patient not taking: Reported on 10/20/2015) 7 tablet 0 Not Taking at Unknown time  . loperamide (IMODIUM) 2 MG capsule Take 2-4 mg by mouth as needed  for diarrhea or loose stools. Reported on 02/23/2016   Not Taking at Unknown time  . promethazine (PHENERGAN) 12.5 MG tablet Take 0.5-1 tablets (6.25-12.5 mg total) by mouth every 4 (four) hours as needed for nausea or vomiting. Reported on 10/29/2015 45 tablet 4 Not Taking at Unknown time     Allergies  Allergen Reactions  . Amoxicillin Other (See Comments)    Reaction:  Stomach cramps   . Cefuroxime  Itching  . Contrast Media [Iodinated Diagnostic Agents] Hives  . Metrizamide   . Morphine And Related     Reaction: face flushed    . Propoxyphene   . Cephalexin Rash  . Sulfa Antibiotics Rash  . Sulfonamide Derivatives Rash  . Tramadol Hcl Rash  . Venlafaxine Rash     Past Medical History  Diagnosis Date  . Lupus (systemic lupus erythematosus) ([place])   . Pulmonary hypertension ([place])   . Pneumonia   . Unspecified menopausal and postmenopausal disorder   . Primary pulmonary hypertension ([place])   . Acute glomerulonephritis with other specified pathological lesion in kidney in disease classified elsewhere(580.81)   . Unspecified hypothyroidism   . Shingles (herpes zoster) polyneuropathy March 2013    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  . Hiatal hernia   . DVT (deep venous thrombosis) ([place])   . Cancer (HCC)     Breast  . CHF (congestive heart failure) ([place])   . Renal insufficiency   . Hypertension   . Shingles   . Hyperlipidemia   . Alveolar aeration decreased   . Vitamin B deficiency   . Diaphragmatic hernia   . Esophageal reflux   . Peripheral neuropathy, hereditary/idiopathic   . Sciatica   . Diabetes mellitus without complication ([place])   . Urinary incontinence without sensory awareness   . Breast cancer ([place]) 06/03/2014    positive, radiation    Review of systems:      Physical Exam    Heart and lungs: Regular rate and rhythm without rub or gallop, lungs are bilaterally clear.    HEENT: Normocephalic atraumatic eyes are anicteric    Other:     Pertinant exam  for procedure: Soft nontender nondistended bowel sounds positive normoactive.    Planned proceedures: EGD and indicated procedures. I have discussed the risks benefits and complications of procedures to include not limited to bleeding, infection, perforation and the risk of sedation and the patient wishes to proceed. I have discussed the risks benefits and complications of procedures to include not limited to bleeding, infection, perforation and the risk of sedation and the patient wishes to proceed.    Lollie Sails, MD Gastroenterology 02/23/2016  1:13 PM

## 2016-02-23 NOTE — Anesthesia Preprocedure Evaluation (Signed)
Anesthesia Evaluation  Patient identified by MRN, date of birth, ID band Patient awake    Reviewed: Allergy & Precautions, H&P , NPO status , Patient's Chart, lab work & pertinent test results, reviewed documented beta blocker date and time   Airway Mallampati: II   Neck ROM: full    Dental  (+) Poor Dentition   Pulmonary neg pulmonary ROS, shortness of breath and with exertion, pneumonia, unresolved, former smoker,    Pulmonary exam normal        Cardiovascular hypertension, + Peripheral Vascular Disease and +CHF  negative cardio ROS Normal cardiovascular exam Rhythm:regular     Neuro/Psych  Neuromuscular disease negative neurological ROS  negative psych ROS   GI/Hepatic negative GI ROS, Neg liver ROS, hiatal hernia, PUD, GERD  Medicated,  Endo/Other  negative endocrine ROSdiabetesHypothyroidism   Renal/GU Renal diseasenegative Renal ROS  negative genitourinary   Musculoskeletal   Abdominal   Peds  Hematology negative hematology ROS (+) anemia ,   Anesthesia Other Findings Past Medical History:   Lupus (systemic lupus erythematosus) ([place])                   Pulmonary hypertension (HCC)                                 Pneumonia                                                    Unspecified menopausal and postmenopausal diso*              Primary pulmonary hypertension ([place])                         Acute glomerulonephritis with other specified *              Unspecified hypothyroidism                                   Shingles (herpes zoster) polyneuropathy         March 2013     Comment: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   Hiatal hernia                                                DVT (deep venous thrombosis) (HCC)                           Cancer (HCC)                                                   Comment:Breast   CHF (congestive heart failure) (HCC)                         Renal insufficiency  Hypertension                                                 Shingles                                                     Hyperlipidemia                                               Alveolar aeration decreased                                  Vitamin B deficiency                                         Diaphragmatic hernia                                         Esophageal reflux                                            Peripheral neuropathy, hereditary/idiopathic                 Sciatica                                                     Diabetes mellitus without complication (HCC)                 Urinary incontinence without sensory awareness               Breast cancer ([place])                             06/03/2014      Comment:positive, radiation Past Surgical History:   KYPHOPLASTY                                     N/A 02/10/2015      Comment:Procedure: [id number];  Surgeon: Hessie Knows, MD;  Location: [facility] ORS;  Service:               Orthopedics;  Laterality: N/A;   LAPAROTOMY  N/A 03/11/2015      Comment:Procedure: REDUCTION OF GASTRIC VOLVULUS,               SPLEENECTOMY, GASTRIC TUBE PLACEMENT;  Surgeon:              Robert Bellow, MD;  Location: [facility] ORS;                Service: General;  Laterality: N/A;   ESOPHAGOGASTRODUODENOSCOPY (EGD) WITH PROPOFOL  N/A 03/28/2015       Comment:Procedure: ESOPHAGOGASTRODUODENOSCOPY (EGD)               WITH PROPOFOL;  Surgeon: Robert Bellow, MD;              Location: [facility] ENDOSCOPY;  Service: Endoscopy;               Laterality: N/A;   ESOPHAGOGASTRODUODENOSCOPY (EGD) WITH PROPOFOL  N/A 06/10/2015      Comment:Procedure: ESOPHAGOGASTRODUODENOSCOPY (EGD)               WITH PROPOFOL;  Surgeon: Lollie Sails, MD;              Location: [facility] ENDOSCOPY;  Service: Endoscopy;               Laterality: N/A;   PERIPHERAL VASCULAR CATHETERIZATION              N/A 06/12/2015      Comment:Procedure: IVC Filter Insertion;  Surgeon:               Algernon Huxley, MD;  Location: [place] [facility]               LAB;  Service: Cardiovascular;  Laterality:               N/A;   ESOPHAGOGASTRODUODENOSCOPY                      N/A 07/19/2015     Comment:Procedure: ESOPHAGOGASTRODUODENOSCOPY (EGD);                Surgeon: Hulen Luster, MD;  Location: [facility]               ENDOSCOPY;  Service: Gastroenterology;                Laterality: N/A;   BREAST EXCISIONAL BIOPSY                        Left 06/03/2014      Comment:positive   DIAGNOSTIC LAPAROSCOPY                                        STOMACH SURGERY                                                 Comment:for volvolus   ESOPHAGOGASTRODUODENOSCOPY (EGD) WITH PROPOFOL  N/A 10/20/2015      Comment:Procedure: ESOPHAGOGASTRODUODENOSCOPY (EGD)               WITH PROPOFOL;  Surgeon: Lollie Sails, MD;              Location: [facility] ENDOSCOPY;  Service: Endoscopy;  Laterality: N/A;   Reproductive/Obstetrics                             Anesthesia Physical Anesthesia Plan  ASA: III  Anesthesia Plan: General   Post-op Pain Management:    Induction:   Airway Management Planned:   Additional Equipment:   Intra-op Plan:   Post-operative Plan:   Informed Consent: I have reviewed the patients History and Physical, chart, labs and discussed the procedure including the risks, benefits and alternatives for the proposed anesthesia with the patient or authorized representative who has indicated his/her understanding and acceptance.   Dental Advisory Given  Plan Discussed with: CRNA  Anesthesia Plan Comments:         Anesthesia Quick Evaluation

## 2016-02-24 ENCOUNTER — Encounter: Payer: Self-pay | Admitting: Gastroenterology

## 2016-02-24 ENCOUNTER — Ambulatory Visit: Payer: Medicare Other | Attending: Anesthesiology | Admitting: Anesthesiology

## 2016-02-24 VITALS — BP 120/68 | HR 82 | Temp 98.7°F | Resp 16 | Ht 63.0 in | Wt 118.0 lb

## 2016-02-24 DIAGNOSIS — M4806 Spinal stenosis, lumbar region: Secondary | ICD-10-CM | POA: Insufficient documentation

## 2016-02-24 DIAGNOSIS — M47817 Spondylosis without myelopathy or radiculopathy, lumbosacral region: Secondary | ICD-10-CM

## 2016-02-24 DIAGNOSIS — M549 Dorsalgia, unspecified: Secondary | ICD-10-CM | POA: Diagnosis present

## 2016-02-24 DIAGNOSIS — M1288 Other specific arthropathies, not elsewhere classified, other specified site: Secondary | ICD-10-CM | POA: Diagnosis not present

## 2016-02-24 DIAGNOSIS — R928 Other abnormal and inconclusive findings on diagnostic imaging of breast: Secondary | ICD-10-CM | POA: Diagnosis not present

## 2016-02-24 DIAGNOSIS — M5441 Lumbago with sciatica, right side: Secondary | ICD-10-CM | POA: Insufficient documentation

## 2016-02-24 DIAGNOSIS — M5136 Other intervertebral disc degeneration, lumbar region: Secondary | ICD-10-CM | POA: Insufficient documentation

## 2016-02-24 MED ORDER — ROPIVACAINE HCL 2 MG/ML IJ SOLN
10.0000 mL | Freq: Once | INTRAMUSCULAR | Status: AC
  Administered 2016-02-24: 10 mL via EPIDURAL
  Filled 2016-02-24: qty 10

## 2016-02-24 MED ORDER — MIDAZOLAM HCL 5 MG/5ML IJ SOLN
INTRAMUSCULAR | Status: AC
  Administered 2016-02-24: 1 mg
  Filled 2016-02-24: qty 5

## 2016-02-24 MED ORDER — MIDAZOLAM HCL 2 MG/2ML IJ SOLN: 5.0000 mg | Freq: Once | INTRAMUSCULAR | Status: DC

## 2016-02-24 MED ORDER — TRIAMCINOLONE ACETONIDE 40 MG/ML IJ SUSP
40.0000 mg | Freq: Once | INTRAMUSCULAR | Status: AC
  Administered 2016-02-24: 40 mg
  Filled 2016-02-24: qty 1

## 2016-02-24 MED ORDER — LIDOCAINE HCL (PF) 1 % IJ SOLN
5.0000 mL | Freq: Once | INTRAMUSCULAR | Status: AC
  Administered 2016-02-24: 5 mL via SUBCUTANEOUS
  Filled 2016-02-24: qty 5

## 2016-02-24 NOTE — Patient Instructions (Signed)
Pain Management Discharge Instructions  General Discharge Instructions :  If you need to reach your doctor call: Monday-Friday 8:00 am - 4:00 pm at [phone] or toll free [phone].  After clinic hours [phone] to have operator reach doctor.  Bring all of your medication bottles to all your appointments in the pain clinic.  To cancel or reschedule your appointment with Pain Management please remember to call 24 hours in advance to avoid a fee.  Refer to the educational materials which you have been given on: General Risks, I had my Procedure. Discharge Instructions, Post Sedation.  Post Procedure Instructions:  The drugs you were given will stay in your system until tomorrow, so for the next 24 hours you should not drive, make any legal decisions or drink any alcoholic beverages.  You may eat anything you prefer, but it is better to start with liquids then soups and crackers, and gradually work up to solid foods.  Please notify your doctor immediately if you have any unusual bleeding, trouble breathing or pain that is not related to your normal pain.  Depending on the type of procedure that was done, some parts of your body may feel week and/or numb.  This usually clears up by tonight or the next day.  Walk with the use of an assistive device or accompanied by an adult for the 24 hours.  You may use ice on the affected area for the first 24 hours.  Put ice in a Ziploc bag and cover with a towel and place against area 15 minutes on 15 minutes off.  You may switch to heat after 24 hours.Facet Joint Block The facet joints connect the bones of the spine (vertebrae). They make it possible for you to bend, twist, and make other movements with your spine. They also prevent you from overbending, overtwisting, and making other excessive movements.  A facet joint block is a procedure where a numbing medicine (anesthetic) is injected into a facet joint. Often, a type of anti-inflammatory  medicine called a steroid is also injected. A facet joint block may be done for two reasons:   Diagnosis. A facet joint block may be done as a test to see whether neck or back pain is caused by a worn-down or infected facet joint. If the pain gets better after a facet joint block, it means the pain is probably coming from the facet joint. If the pain does not get better, it means the pain is probably not coming from the facet joint.   Therapy. A facet joint block may be done to relieve neck or back pain caused by a facet joint. A facet joint block is only done as a therapy if the pain does not improve with medicine, exercise programs, physical therapy, and other forms of pain management. LET YOUR HEALTH CARE PROVIDER KNOW ABOUT:   Any allergies you have.   All medicines you are taking, including vitamins, herbs, eyedrops, and over-the-counter medicines and creams.   Previous problems you or members of your family have had with the use of anesthetics.   Any blood disorders you have had.   Other health problems you have. RISKS AND COMPLICATIONS Generally, having a facet joint block is safe. However, as with any procedure, complications can occur. Possible complications associated with having a facet joint block include:   Bleeding.   Injury to a nerve near the injection site.   Pain at the injection site.   Weakness or numbness in areas controlled by nerves near   the injection site.   Infection.   Temporary fluid retention.   Allergic reaction to anesthetics or medicines used during the procedure. BEFORE THE PROCEDURE   Follow your health care provider's instructions if you are taking dietary supplements or medicines. You may need to stop taking them or reduce your dosage.   Do not take any new dietary supplements or medicines without asking your health care provider first.   Follow your health care provider's instructions about eating and drinking before the  procedure. You may need to stop eating and drinking several hours before the procedure.   Arrange to have an adult drive you home after the procedure. PROCEDURE  You may need to remove your clothing and dress in an open-back gown so that your health care provider can access your spine.   The procedure will be done while you are lying on an X-ray table. Most of the time you will be asked to lie on your stomach, but you may be asked to lie in a different position if an injection will be made in your neck.   Special machines will be used to monitor your oxygen levels, heart rate, and blood pressure.   If an injection will be made in your neck, an intravenous (IV) tube will be inserted into one of your veins. Fluids and medicine will flow directly into your body through the IV tube.   The area over the facet joint where the injection will be made will be cleaned with an antiseptic soap. The surrounding skin will be covered with sterile drapes.   An anesthetic will be applied to your skin to make the injection area numb. You may feel a temporary stinging or burning sensation.   A video X-ray machine will be used to locate the joint. A contrast dye may be injected into the facet joint area to help with locating the joint.   When the joint is located, an anesthetic medicine will be injected into the joint through the needle.   Your health care provider will ask you whether you feel pain relief. If you do feel relief, a steroid may be injected to provide pain relief for a longer period of time. If you do not feel relief or feel only partial relief, additional injections of an anesthetic may be made in other facet joints.   The needle will be removed, the skin will be cleansed, and bandages will be applied.  AFTER THE PROCEDURE   You will be observed for 15-30 minutes before being allowed to go home. Do not drive. Have an adult drive you or take a taxi or public transportation instead.    If you feel pain relief, the pain will return in several hours or days when the anesthetic wears off.   You may feel pain relief 2-14 days after the procedure. The amount of time this relief lasts varies from person to person.   It is normal to feel some tenderness over the injected area(s) for 2 days following the procedure.   If you have diabetes, you may have a temporary increase in blood sugar.   This information is not intended to replace advice given to you by your health care provider. Make sure you discuss any questions you have with your health care provider.   Document Released: 01/25/2007 Document Revised: 09/26/2014 Document Reviewed: 06/25/2012 Elsevier Interactive Patient Education 2016 [employer]  

## 2016-02-25 ENCOUNTER — Telehealth: Payer: Self-pay | Admitting: *Deleted

## 2016-02-25 NOTE — Telephone Encounter (Signed)
Spoke with patient's son, patient is still asleep but has been doing well.  If she has questions or concerns re; her procedure on yesterday, please call our office.

## 2016-02-25 NOTE — Progress Notes (Addendum)
Subjective:  Patient ID: Diana [name], female    DOB: 10-07-1934  Age: 81 y.o. MRN: [id number]  CC: Back Pain  Procedure: Right sided #2 at L3-4 or L4-5 and L5-S1 and S1 medial nerve branch lock under fluoroscopic guidance with moderate sedation  HPI Diana [name] presents for reevaluation.. At Diana [name] last visit she had Diana [name] second facet block and reports a70% improvement and much better relief with that. She states that she is sleeping better and able to stand for longer periods of time with less pain. Overall she feels like she has made good progress. Otherwise this point she is in Diana [name] usual state of health.  Outpatient Prescriptions Prior to Visit  Medication Sig Dispense Refill  . acetaminophen (TYLENOL) 325 MG tablet Take 650 mg by mouth every 4 (four) hours as needed for mild pain or fever. Reported on 02/23/2016    . ALPRAZolam (XANAX) 0.25 MG tablet Take 1 tablet (0.25 mg total) by mouth at bedtime as needed for anxiety. 20 tablet 0  . azelastine (ASTELIN) 0.1 % nasal spray Place 1 spray into both nostrils 2 (two) times daily. Reported on 02/23/2016    . budesonide-formoterol (SYMBICORT) 160-4.5 MCG/ACT inhaler Inhale 2 puffs into the lungs 2 (two) times daily. Reported on 12/16/2015    . chlorpheniramine-HYDROcodone (TUSSIONEX PENNKINETIC ER) 10-8 MG/5ML SUER Take 5 mLs by mouth at bedtime as needed for cough. 140 mL 0  . cholecalciferol (VITAMIN D) 400 UNITS TABS tablet Take 400 Units by mouth.    . cyanocobalamin (,VITAMIN B-12,) 1000 MCG/ML injection Inject 1 mL (1,000 mcg total) into the muscle every 30 (thirty) days. Pt uses on the 1st of every month. 10 mL 1  . Fluticasone-Salmeterol (ADVAIR) 250-50 MCG/DOSE AEPB Inhale 1 puff into the lungs 2 (two) times daily. Reported on 02/23/2016    . hydroxychloroquine (PLAQUENIL) 200 MG tablet Take 200 mg by mouth daily.    Marland Kitchen letrozole (FEMARA) 2.5 MG tablet Take 1 tablet (2.5 mg total) by mouth daily. 90 tablet 3  . levocetirizine (XYZAL) 5  MG tablet Take 5 mg by mouth daily.    Marland Kitchen loperamide (IMODIUM) 2 MG capsule Take 2-4 mg by mouth as needed for diarrhea or loose stools. Reported on 02/23/2016    . metoCLOPramide (REGLAN) 10 MG tablet Take 1 tablet (10 mg total) by mouth 4 (four) times daily -  before meals and at bedtime. 120 tablet 5  . metoprolol succinate (TOPROL-XL) 25 MG 24 hr tablet Take 25 mg by mouth daily.    . mycophenolate (CELLCEPT) 500 MG tablet Take 1,000 mg by mouth 2 (two) times daily.    . Nutritional Supplements (FEEDING SUPPLEMENT, JEVITY 1.5 CAL/FIBER,) LIQD Place 1,000 mLs into feeding tube continuous. 30000 mL 1  . oxyCODONE-acetaminophen (PERCOCET/ROXICET) 5-325 MG tablet Take 1 tablet by mouth daily as needed for severe pain. 90 tablet 0  . pantoprazole sodium (PROTONIX) 40 mg/20 mL PACK Place 40 mg into feeding tube 2 (two) times daily.    . predniSONE (DELTASONE) 5 MG tablet Take 5 mg by mouth daily.     . promethazine (PHENERGAN) 12.5 MG tablet Take 0.5-1 tablets (6.25-12.5 mg total) by mouth every 4 (four) hours as needed for nausea or vomiting. Reported on 10/29/2015 45 tablet 4  . sucralfate (CARAFATE) 1 G tablet Take 1 tablet (1 g total) by mouth 4 (four) times daily -  with meals and at bedtime. 120 tablet 0  . SYNTHROID 150 MCG tablet TAKE 1  TABLET BY MOUTH EVERY DAY 30 tablet 4  . Tadalafil, PAH, (ADCIRCA) 20 MG TABS Take 40 mg by mouth daily.    . Water For Irrigation, Sterile (FREE WATER) SOLN Place 150 mLs into feeding tube 2 (two) times daily. 1500 mL 2  . letrozole (FEMARA) 2.5 MG tablet TAKE 1 TABLET (2.5 MG TOTAL) BY MOUTH DAILY. 45 tablet 0  . levofloxacin (LEVAQUIN) 500 MG tablet Take 1 tablet (500 mg total) by mouth daily. (Patient not taking: Reported on 10/20/2015) 7 tablet 0   Facility-Administered Medications Prior to Visit  Medication Dose Route Frequency Provider Last Rate Last Dose  . iohexol (OMNIPAQUE) 180 MG/ML injection 20 mL  20 mL Other Once PRN Yevette Edwards, MD      .  lactated ringers infusion 1,000 mL  1,000 mL Intravenous Continuous Yevette Edwards, MD      . lactated ringers infusion 1,000 mL  1,000 mL Intravenous Continuous Yevette Edwards, MD      . lidocaine (PF) (XYLOCAINE) 1 % injection 5 mL  5 mL Subcutaneous Once Yevette Edwards, MD      . lidocaine (PF) (XYLOCAINE) 1 % injection 5 mL  5 mL Subcutaneous Once Yevette Edwards, MD      . midazolam (VERSED) 5 MG/5ML injection 5 mg  5 mg Intravenous Once Yevette Edwards, MD      . midazolam (VERSED) 5 MG/5ML injection 5 mg  5 mg Intravenous Once Yevette Edwards, MD      . ropivacaine (PF) 2 mg/ml (0.2%) (NAROPIN) epidural 10 mL  10 mL Epidural Once Yevette Edwards, MD      . ropivacaine (PF) 2 mg/ml (0.2%) (NAROPIN) epidural 10 mL  10 mL Epidural Once Yevette Edwards, MD      . sodium chloride flush (NS) 0.9 % injection 10 mL  10 mL Other Once Yevette Edwards, MD      . sodium chloride flush (NS) 0.9 % injection 10 mL  10 mL Other Once Yevette Edwards, MD      . triamcinolone acetonide (KENALOG-40) injection 40 mg  40 mg Other Once Yevette Edwards, MD      . triamcinolone acetonide (KENALOG-40) injection 40 mg  40 mg Other Once Yevette Edwards, MD       ROS Review of Systems  Objective:  BP 120/68 mmHg  Pulse 82  Temp(Src) 98.7 F (37.1 C) (Oral)  Resp 16  Ht 5\' 3"  (1.6 m)  Wt 118 lb (53.524 kg)  BMI 20.91 kg/m2  SpO2 99%   BP Readings from Last 3 Encounters:  02/24/16 120/68  02/23/16 120/71  02/04/16 125/71     Wt Readings from Last 3 Encounters:  02/24/16 118 lb (53.524 kg)  02/23/16 120 lb (54.432 kg)  02/04/16 119 lb 11.4 oz (54.3 kg)     Physical Exam  PERRL EOMI HEART IS RRR  LCTA MUSCULOSKELETAL Diana [name] straight leg raise is negative. She does have significant pain with extension and right lateral rotation this does appear to be improving. This is greater than with left lateral rotation. No overt trigger points are noted  Labs  Lab Results  Component Value Date   HGBA1C 5.9 07/03/2015    HGBA1C 7.1* 11/19/2014   HGBA1C 6.9* 04/22/2014   Lab Results  Component Value Date   MICROALBUR <0.7 11/28/2014   LDLCALC 64 10/11/2013   CREATININE 1.05* 02/04/2016    -------------------------------------------------------------------------------------------------------------------- Lab Results  Component Value Date  WBC 11.7* 02/04/2016   HGB 12.0 02/04/2016   HCT 36.6 02/04/2016   PLT 307 02/04/2016   GLUCOSE 145* 02/04/2016   CHOL 164 10/11/2013   TRIG 152.0* 10/11/2013   HDL 69.70 10/11/2013   LDLDIRECT 28.0 07/03/2015   LDLCALC 64 10/11/2013   ALT 24 02/04/2016   AST 31 02/04/2016   NA 138 02/04/2016   K 3.9 02/04/2016   CL 102 02/04/2016   CREATININE 1.05* 02/04/2016   BUN 37* 02/04/2016   CO2 28 02/04/2016   TSH 19.280* 02/01/2016   INR 1.22 06/10/2015   HGBA1C 5.9 07/03/2015   MICROALBUR <0.7 11/28/2014    --------------------------------------------------------------------------------------------------------------------- Mm Diag Breast Tomo Bilateral  09/24/2015  CLINICAL DATA:  81 year old female presenting for routine postlumpectomy follow-up. The patient has had a left breast lumpectomy in 2015 followed by radiation therapy. EXAM: DIGITAL DIAGNOSTIC BILATERAL MAMMOGRAM WITH 3D TOMOSYNTHESIS AND CAD COMPARISON:  Previous exam(s). ACR Breast Density Category d: The breast tissue is extremely dense, which lowers the sensitivity of mammography. FINDINGS: There is a marked increase in apparent density of the breast bilaterally, due to marked weight loss in the since the prior mammogram. Interval changes in the upper-outer quadrant of the left breast, consistent with postlumpectomy changes. No suspicious calcifications, masses or areas of distortion are seen in the bilateral breasts. Mammographic images were processed with CAD. IMPRESSION: 1. Interval left breast lumpectomy, without evidence of disease recurrence. 2.  No mammographic evidence of malignancy in the  bilateral breasts. 3. Marked weight loss causing increased density of the bilateral breasts. RECOMMENDATION: Diagnostic mammogram is suggested in 1 year. (Code:[id number]-B-01Y) I have discussed the findings and recommendations with the patient. Results were also provided in writing at the conclusion of the visit. If applicable, a reminder letter will be sent to the patient regarding the next appointment. BI-RADS CATEGORY  2: Benign. Electronically Signed   By: Frederico Hamman M.D.   On: 09/24/2015 13:56     Assessment & Plan:   Cataleia was seen today for back pain.  Diagnoses and all orders for this visit:  Facet arthritis of lumbosacral region -     LUMBAR FACET(MEDIAL BRANCH NERVE BLOCK) MBNB; Future -     triamcinolone acetonide (KENALOG-40) injection 40 mg; 1 mL (40 mg total) by Other route once. -     ropivacaine (PF) 2 mg/ml (0.2%) (NAROPIN) epidural 10 mL; 10 mLs by Epidural route once. -     midazolam (VERSED) injection 5 mg; Inject 5 mLs (5 mg total) into the vein once. -     lidocaine (PF) (XYLOCAINE) 1 % injection 5 mL; Inject 5 mLs into the skin once.  Other orders -     midazolam (VERSED) 5 MG/5ML injection;         ----------------------------------------------------------------------------------------------------------------------  Problem List Items Addressed This Visit      Musculoskeletal and Integument   Facet arthritis of lumbosacral region - Primary   Relevant Medications   triamcinolone acetonide (KENALOG-40) injection 40 mg (Completed)   ropivacaine (PF) 2 mg/ml (0.2%) (NAROPIN) epidural 10 mL (Completed)   midazolam (VERSED) injection 5 mg   lidocaine (PF) (XYLOCAINE) 1 % injection 5 mL (Completed)   Other Relevant Orders   LUMBAR FACET(MEDIAL BRANCH NERVE BLOCK) MBNB        ----------------------------------------------------------------------------------------------------------------------  1.  lumbar facet arthropathy and DDD (degenerative disc  disease), lumbar We'll plan on aRepeat facet injection right side for  therapeutic purpose today. The risks and benefits of the procedure have been described  in full detail.   2. Spinal stenosis of lumbar region   3. Sciatica associated with disorder of lumbar spine, right     ----------------------------------------------------------------------------------------------------------------------  I have discontinued Diana [name] levofloxacin. I am also having Diana [name] maintain Diana [name] hydroxychloroquine, Tadalafil (PAH), acetaminophen, Fluticasone-Salmeterol, loperamide, azelastine, levocetirizine, predniSONE, pantoprazole sodium, feeding supplement (JEVITY 1.5 CAL/FIBER), ALPRAZolam, free water, sucralfate, cholecalciferol, metoprolol succinate, budesonide-formoterol, chlorpheniramine-HYDROcodone, metoCLOPramide, cyanocobalamin, mycophenolate, oxyCODONE-acetaminophen, promethazine, SYNTHROID, and letrozole. We will stop administering lidocaine (PF), midazolam, ropivacaine (PF) 2 mg/ml (0.2%), sodium chloride flush, triamcinolone acetonide, iohexol, lactated ringers, triamcinolone acetonide, sodium chloride flush, ropivacaine (PF) 2 mg/ml (0.2%), midazolam, lidocaine (PF), and lactated ringers. Additionally, we administered triamcinolone acetonide, ropivacaine (PF) 2 mg/ml (0.2%), lidocaine (PF), and midazolam. Additionally, we will continue to administer midazolam.   Meds ordered this encounter  Medications  . triamcinolone acetonide (KENALOG-40) injection 40 mg    Sig:   . ropivacaine (PF) 2 mg/ml (0.2%) (NAROPIN) epidural 10 mL    Sig:   . midazolam (VERSED) injection 5 mg    Sig:   . lidocaine (PF) (XYLOCAINE) 1 % injection 5 mL    Sig:   . midazolam (VERSED) 5 MG/5ML injection    Sig:     GARNER, CYNTHIA: cabinet override   Patient's Medications  New Prescriptions   No medications on file  Previous Medications   ACETAMINOPHEN (TYLENOL) 325 MG TABLET    Take 650 mg by mouth every 4 (four)  hours as needed for mild pain or fever. Reported on 02/23/2016   ALPRAZOLAM (XANAX) 0.25 MG TABLET    Take 1 tablet (0.25 mg total) by mouth at bedtime as needed for anxiety.   AZELASTINE (ASTELIN) 0.1 % NASAL SPRAY    Place 1 spray into both nostrils 2 (two) times daily. Reported on 02/23/2016   BUDESONIDE-FORMOTEROL (SYMBICORT) 160-4.5 MCG/ACT INHALER    Inhale 2 puffs into the lungs 2 (two) times daily. Reported on 12/16/2015   CHLORPHENIRAMINE-HYDROCODONE (TUSSIONEX PENNKINETIC ER) 10-8 MG/5ML SUER    Take 5 mLs by mouth at bedtime as needed for cough.   CHOLECALCIFEROL (VITAMIN D) 400 UNITS TABS TABLET    Take 400 Units by mouth.   CYANOCOBALAMIN (,VITAMIN B-12,) 1000 MCG/ML INJECTION    Inject 1 mL (1,000 mcg total) into the muscle every 30 (thirty) days. Pt uses on the 1st of every month.   FLUTICASONE-SALMETEROL (ADVAIR) 250-50 MCG/DOSE AEPB    Inhale 1 puff into the lungs 2 (two) times daily. Reported on 02/23/2016   HYDROXYCHLOROQUINE (PLAQUENIL) 200 MG TABLET    Take 200 mg by mouth daily.   LETROZOLE (FEMARA) 2.5 MG TABLET    Take 1 tablet (2.5 mg total) by mouth daily.   LEVOCETIRIZINE (XYZAL) 5 MG TABLET    Take 5 mg by mouth daily.   LOPERAMIDE (IMODIUM) 2 MG CAPSULE    Take 2-4 mg by mouth as needed for diarrhea or loose stools. Reported on 02/23/2016   METOCLOPRAMIDE (REGLAN) 10 MG TABLET    Take 1 tablet (10 mg total) by mouth 4 (four) times daily -  before meals and at bedtime.   METOPROLOL SUCCINATE (TOPROL-XL) 25 MG 24 HR TABLET    Take 25 mg by mouth daily.   MYCOPHENOLATE (CELLCEPT) 500 MG TABLET    Take 1,000 mg by mouth 2 (two) times daily.   NUTRITIONAL SUPPLEMENTS (FEEDING SUPPLEMENT, JEVITY 1.5 CAL/FIBER,) LIQD    Place 1,000 mLs into feeding tube continuous.   OXYCODONE-ACETAMINOPHEN (PERCOCET/ROXICET) 5-325 MG TABLET    Take  1 tablet by mouth daily as needed for severe pain.   PANTOPRAZOLE SODIUM (PROTONIX) 40 MG/20 ML PACK    Place 40 mg into feeding tube 2 (two) times daily.    PREDNISONE (DELTASONE) 5 MG TABLET    Take 5 mg by mouth daily.    PROMETHAZINE (PHENERGAN) 12.5 MG TABLET    Take 0.5-1 tablets (6.25-12.5 mg total) by mouth every 4 (four) hours as needed for nausea or vomiting. Reported on 10/29/2015   SUCRALFATE (CARAFATE) 1 G TABLET    Take 1 tablet (1 g total) by mouth 4 (four) times daily -  with meals and at bedtime.   SYNTHROID 150 MCG TABLET    TAKE 1 TABLET BY MOUTH EVERY DAY   TADALAFIL, PAH, (ADCIRCA) 20 MG TABS    Take 40 mg by mouth daily.   WATER FOR IRRIGATION, STERILE (FREE WATER) SOLN    Place 150 mLs into feeding tube 2 (two) times daily.  Modified Medications   No medications on file  Discontinued Medications   LETROZOLE (FEMARA) 2.5 MG TABLET    TAKE 1 TABLET (2.5 MG TOTAL) BY MOUTH DAILY.   LEVOFLOXACIN (LEVAQUIN) 500 MG TABLET    Take 1 tablet (500 mg total) by mouth daily.   ----------------------------------------------------------------------------------------------------------------------  Follow-up: Return in about 1 month (around 03/25/2016) for evaluation, procedure.   Procedure:    Patient was taken to the fluoroscopy suite and placed in prone position. A total dose of 2 mg of Versed with half cc of fentanyl were titrated for moderate sedation. Vital signs are stable throughout the procedure. The area overlying the aforementioned facets were prepped with Betadine 3 and strict aseptic technique was utilized throughout the procedure. I Identified the areas overlying the aforementioned right facets at  L3-4 or L4-5 L5-S1. 1% lidocaine 1 cc was infiltrated subcutaneous and into the fascia with a 25-gauge needle at each of these sites. I then advanced a 22-gauge 3-1/2 inch Quinckie needle with the needle tip to lie at the [facility] portion of the [employer] dog". There was negative aspiration for heme or CSF and  no paresthesia. I then injected 2 cc of ropivacaine 0.2% mixed with 8 mg of triamcinolone at each of the aforementioned  sites. These needles were withdrawn and the L5-S1 needle was redirected towards the right S1 posterior foramen. This was once again no paresthesia, negative aspiration and 2 cc of this same mixture was injected at this site. The patient was convalesced discharged home stable condition for follow-up as mentioned. Yevette Edwards, MD

## 2016-02-29 DIAGNOSIS — Z885 Allergy status to narcotic agent status: Secondary | ICD-10-CM | POA: Diagnosis not present

## 2016-02-29 DIAGNOSIS — K210 Gastro-esophageal reflux disease with esophagitis: Secondary | ICD-10-CM | POA: Diagnosis not present

## 2016-02-29 DIAGNOSIS — M3214 Glomerular disease in systemic lupus erythematosus: Secondary | ICD-10-CM | POA: Diagnosis not present

## 2016-02-29 DIAGNOSIS — M810 Age-related osteoporosis without current pathological fracture: Secondary | ICD-10-CM | POA: Diagnosis not present

## 2016-02-29 DIAGNOSIS — Z9889 Other specified postprocedural states: Secondary | ICD-10-CM | POA: Diagnosis not present

## 2016-02-29 DIAGNOSIS — R600 Localized edema: Secondary | ICD-10-CM | POA: Diagnosis not present

## 2016-02-29 DIAGNOSIS — K224 Dyskinesia of esophagus: Secondary | ICD-10-CM | POA: Diagnosis not present

## 2016-02-29 DIAGNOSIS — Z7951 Long term (current) use of inhaled steroids: Secondary | ICD-10-CM | POA: Diagnosis not present

## 2016-02-29 DIAGNOSIS — I272 Other secondary pulmonary hypertension: Secondary | ICD-10-CM | POA: Diagnosis not present

## 2016-02-29 DIAGNOSIS — Z79891 Long term (current) use of opiate analgesic: Secondary | ICD-10-CM | POA: Diagnosis not present

## 2016-02-29 DIAGNOSIS — Z79899 Other long term (current) drug therapy: Secondary | ICD-10-CM | POA: Diagnosis not present

## 2016-02-29 DIAGNOSIS — Z7901 Long term (current) use of anticoagulants: Secondary | ICD-10-CM | POA: Diagnosis not present

## 2016-02-29 DIAGNOSIS — M329 Systemic lupus erythematosus, unspecified: Secondary | ICD-10-CM | POA: Diagnosis not present

## 2016-02-29 DIAGNOSIS — Z79811 Long term (current) use of aromatase inhibitors: Secondary | ICD-10-CM | POA: Diagnosis not present

## 2016-02-29 DIAGNOSIS — I5030 Unspecified diastolic (congestive) heart failure: Secondary | ICD-10-CM | POA: Diagnosis not present

## 2016-02-29 DIAGNOSIS — Z882 Allergy status to sulfonamides status: Secondary | ICD-10-CM | POA: Diagnosis not present

## 2016-02-29 DIAGNOSIS — Z86718 Personal history of other venous thrombosis and embolism: Secondary | ICD-10-CM | POA: Diagnosis not present

## 2016-02-29 DIAGNOSIS — M89341 Hypertrophy of bone, right hand: Secondary | ICD-10-CM | POA: Diagnosis not present

## 2016-02-29 DIAGNOSIS — Z888 Allergy status to other drugs, medicaments and biological substances status: Secondary | ICD-10-CM | POA: Diagnosis not present

## 2016-02-29 DIAGNOSIS — Z7952 Long term (current) use of systemic steroids: Secondary | ICD-10-CM | POA: Diagnosis not present

## 2016-02-29 DIAGNOSIS — R5383 Other fatigue: Secondary | ICD-10-CM | POA: Diagnosis not present

## 2016-02-29 DIAGNOSIS — R1310 Dysphagia, unspecified: Secondary | ICD-10-CM | POA: Diagnosis not present

## 2016-03-03 ENCOUNTER — Other Ambulatory Visit: Payer: Self-pay | Admitting: *Deleted

## 2016-03-03 NOTE — Patient Outreach (Signed)
[facility]) Care Management  03/03/2016  SHARE ETTEN [birth year] ID:[id number]   RN Health Coach attempted #1  Follow up outreach call to patient.  Patient was unavailable. HIPPA compliance voicemail message was left with return callback number.  Plan: RN will call patient again within 14 days.   [facility] [phone]

## 2016-03-08 DIAGNOSIS — K219 Gastro-esophageal reflux disease without esophagitis: Secondary | ICD-10-CM | POA: Diagnosis not present

## 2016-03-08 DIAGNOSIS — K449 Diaphragmatic hernia without obstruction or gangrene: Secondary | ICD-10-CM | POA: Diagnosis not present

## 2016-03-10 DIAGNOSIS — Z880 Allergy status to penicillin: Secondary | ICD-10-CM | POA: Diagnosis not present

## 2016-03-10 DIAGNOSIS — M3214 Glomerular disease in systemic lupus erythematosus: Secondary | ICD-10-CM | POA: Diagnosis not present

## 2016-03-10 DIAGNOSIS — Z885 Allergy status to narcotic agent status: Secondary | ICD-10-CM | POA: Diagnosis not present

## 2016-03-10 DIAGNOSIS — Z79899 Other long term (current) drug therapy: Secondary | ICD-10-CM | POA: Diagnosis not present

## 2016-03-10 DIAGNOSIS — E039 Hypothyroidism, unspecified: Secondary | ICD-10-CM | POA: Diagnosis not present

## 2016-03-10 DIAGNOSIS — Z882 Allergy status to sulfonamides status: Secondary | ICD-10-CM | POA: Diagnosis not present

## 2016-03-10 DIAGNOSIS — J329 Chronic sinusitis, unspecified: Secondary | ICD-10-CM | POA: Diagnosis not present

## 2016-03-10 DIAGNOSIS — I5032 Chronic diastolic (congestive) heart failure: Secondary | ICD-10-CM | POA: Diagnosis not present

## 2016-03-10 DIAGNOSIS — Z79811 Long term (current) use of aromatase inhibitors: Secondary | ICD-10-CM | POA: Diagnosis not present

## 2016-03-15 ENCOUNTER — Other Ambulatory Visit: Payer: Self-pay | Admitting: *Deleted

## 2016-03-15 NOTE — Patient Outreach (Signed)
[facility]) Care Management  [facility] Care Manager  03/15/2016   Diana [name] 11/20/1934 [id number]  Subjective: RN Health Coach telephone call to patient.  Hipaa compliance verified. Per patient she is doing good. RN had to prompt the zones due to patient unable to see the magnet. Per patient her vision is so poor she cannot read anything. Cataract surgery is planned for July.  Patient is in the green zone.  She is not having any swelling in extremities, coughing . Patient had a lumbar medial nerve block that has helped her back a lot. Patient stated she is having very little discomfort in her back. She is having a little cramping in her stomach that she feels is from the EGD. Patient weighed today and was 120 pounds. Patient has agreed to follow up outreach.   Objective:   Encounter Medications:  Outpatient Encounter Prescriptions as of 03/15/2016  Medication Sig Note  . acetaminophen (TYLENOL) 325 MG tablet Take 650 mg by mouth every 4 (four) hours as needed for mild pain or fever. Reported on 02/23/2016   . ALPRAZolam (XANAX) 0.25 MG tablet Take 1 tablet (0.25 mg total) by mouth at bedtime as needed for anxiety. 12/16/2015: Patient reports she has not used in the past week.    Marland Kitchen azelastine (ASTELIN) 0.1 % nasal spray Place 1 spray into both nostrils 2 (two) times daily. Reported on 02/23/2016 12/16/2015: Patient reports she is not using  . budesonide-formoterol (SYMBICORT) 160-4.5 MCG/ACT inhaler Inhale 2 puffs into the lungs 2 (two) times daily. Reported on 12/16/2015 12/16/2015: Patient reports rx written by [facility] and out of inhaler.  . chlorpheniramine-HYDROcodone (TUSSIONEX PENNKINETIC ER) 10-8 MG/5ML SUER Take 5 mLs by mouth at bedtime as needed for cough.   . cholecalciferol (VITAMIN D) 400 UNITS TABS tablet Take 400 Units by mouth.   . cyanocobalamin (,VITAMIN B-12,) 1000 MCG/ML injection Inject 1 mL (1,000 mcg total) into the muscle every 30 (thirty) days. Pt uses on the  1st of every month.   . Fluticasone-Salmeterol (ADVAIR) 250-50 MCG/DOSE AEPB Inhale 1 puff into the lungs 2 (two) times daily. Reported on 02/23/2016 12/16/2015: Patient reports rx came from [facility] and out of inhaler.    . hydroxychloroquine (PLAQUENIL) 200 MG tablet Take 200 mg by mouth daily. 12/16/2015: Patient has stopped taking for procedure.  Today 12/16/15 is the 9th day that she has not taken this medication  . letrozole (FEMARA) 2.5 MG tablet Take 1 tablet (2.5 mg total) by mouth daily.   Marland Kitchen levocetirizine (XYZAL) 5 MG tablet Take 5 mg by mouth daily.   Marland Kitchen loperamide (IMODIUM) 2 MG capsule Take 2-4 mg by mouth as needed for diarrhea or loose stools. Reported on 02/23/2016   . metoCLOPramide (REGLAN) 10 MG tablet Take 1 tablet (10 mg total) by mouth 4 (four) times daily -  before meals and at bedtime.   . metoprolol succinate (TOPROL-XL) 25 MG 24 hr tablet Take 25 mg by mouth daily. 12/16/2015: Patient reports she takes 1/2 tab daily.    . mycophenolate (CELLCEPT) 500 MG tablet Take 1,000 mg by mouth 2 (two) times daily.   . Nutritional Supplements (FEEDING SUPPLEMENT, JEVITY 1.5 CAL/FIBER,) LIQD Place 1,000 mLs into feeding tube continuous.   Marland Kitchen oxyCODONE-acetaminophen (PERCOCET/ROXICET) 5-325 MG tablet Take 1 tablet by mouth daily as needed for severe pain.   . pantoprazole sodium (PROTONIX) 40 mg/20 mL PACK Place 40 mg into feeding tube 2 (two) times daily.   . predniSONE (DELTASONE) 5  MG tablet Take 5 mg by mouth daily.  12/16/2015: Patient reports now on 5 mg daily  . promethazine (PHENERGAN) 12.5 MG tablet Take 0.5-1 tablets (6.25-12.5 mg total) by mouth every 4 (four) hours as needed for nausea or vomiting. Reported on 10/29/2015   . sucralfate (CARAFATE) 1 G tablet Take 1 tablet (1 g total) by mouth 4 (four) times daily -  with meals and at bedtime.   Marland Kitchen SYNTHROID 150 MCG tablet TAKE 1 TABLET BY MOUTH EVERY DAY   . Tadalafil, PAH, (ADCIRCA) 20 MG TABS Take 40 mg by mouth daily.   . Water For  Irrigation, Sterile (FREE WATER) SOLN Place 150 mLs into feeding tube 2 (two) times daily.    Facility-Administered Encounter Medications as of 03/15/2016  Medication  . midazolam (VERSED) injection 5 mg    Functional Status:  In your present state of health, do you have any difficulty performing the following activities: 02/02/2016 01/06/2016  Hearing? N -  Vision? Y -  Difficulty concentrating or making decisions? N -  Walking or climbing stairs? Y -  Dressing or bathing? N -  Doing errands, shopping? Y -  Quarry manager and eating ? N N  Using the Toilet? N N  In the past six months, have you accidently leaked urine? N N  Do you have problems with loss of bowel control? N N  Managing your Medications? N N  Managing your Finances? N N  Housekeeping or managing your Housekeeping? [name]    Fall/Depression Screening: PHQ 2/9 Scores 03/15/2016 02/24/2016 02/02/2016 01/25/2016 01/06/2016 12/11/2015 11/18/2015  PHQ - 2 Score 0 0 0 - 0 0 0  Exception Documentation - - - Patient refusal - - Patient refusal   THN CM Care Plan Problem One        Most Recent Value   Care Plan Problem One  Knowledge deficit in self management of congestive heart falure   Role Documenting the Problem One  Health Coach   Care Plan for Problem One  Active   THN Long Term Goal (31-90 days)  Patient will not have any admissions for CHF within the next 90 days   Interventions for Problem One Long Term Goal  RN reminded patient to keep appointments with PCP and other physician that patient is seeing. Patient reminded the importance of taking medications as per order. RN Health Coach will monitor patient monthly telephonic.Patient is also following up with wellness checks   THN CM Short Term Goal #1 (0-30 days)  Patient will be able to verbalize 3 signs ans symptoms of CHF within the next 30 days   THN CM Short Term Goal #1 Start Date  03/15/16 [patient is able to verbalize with prompting but she is unable to read the magnet. RN  goes over them. Vision poor till cataract surgery next month]   Interventions for Short Term Goal #1  RN sent educational material on Congestive heart failure zones. RN will send a large magnet for CHF to place on refrigerator. RN will follow up with discussion and teachback.   THN CM Short Term Goal #2 (0-30 days)  Patient will weigh daily and record in log within the next 30 days   THN CM Short Term Goal #2 Start Date  03/15/16 [continue. Patient is weight and remebering the weight but unable to read due to the cataracts]   Interventions for Short Term Goal #2  RN Health Coach  sent patient a calendar book for documentation.  RN  discussed the importance of weighing same time each day.RN discussed the importance of weighing and recording any weight gain of 3 pounds in a day or 5 pounds in a week.  Patient is to notify doctor.      Assessment:  Patient has visual impairment with inabilty to read. Patient will continue to  benefit from Health Coach telephonic outreach for education and support for diabetes self management.  Plan:  RN calls patient and verbally discusses Zones and Action. RN will send patient educational material after her cataract surgery RN will call patient within 30 days to follow up and reiterate information discussed.  Gean Maidens BSN RN [facility] Care Management [phone]

## 2016-03-17 DIAGNOSIS — K210 Gastro-esophageal reflux disease with esophagitis: Secondary | ICD-10-CM | POA: Diagnosis not present

## 2016-03-28 ENCOUNTER — Telehealth: Payer: Self-pay | Admitting: *Deleted

## 2016-03-28 MED ORDER — OXYCODONE-ACETAMINOPHEN 5-325 MG PO TABS: 1.0000 | ORAL_TABLET | Freq: Three times a day (TID) | ORAL | Status: DC | PRN

## 2016-03-28 NOTE — Telephone Encounter (Signed)
Patient requesting Oxycodone refill.  Was refilled on 02/01/16 #90, no refills take 1 daily.  Please advise, patient should have enough to get to august.  thanks

## 2016-03-28 NOTE — Telephone Encounter (Signed)
Patient has requested a medication refill for oxycodone.  Pt contact [phone]

## 2016-03-28 NOTE — Telephone Encounter (Signed)
Ok to refill , given patinet 's medical condition

## 2016-03-29 ENCOUNTER — Ambulatory Visit: Payer: Medicare Other | Admitting: Anesthesiology

## 2016-03-29 ENCOUNTER — Telehealth: Payer: Self-pay

## 2016-03-29 ENCOUNTER — Other Ambulatory Visit (INDEPENDENT_AMBULATORY_CARE_PROVIDER_SITE_OTHER): Payer: Medicare Other

## 2016-03-29 DIAGNOSIS — C50412 Malignant neoplasm of upper-outer quadrant of left female breast: Secondary | ICD-10-CM

## 2016-03-29 DIAGNOSIS — R946 Abnormal results of thyroid function studies: Secondary | ICD-10-CM | POA: Diagnosis not present

## 2016-03-29 DIAGNOSIS — E034 Atrophy of thyroid (acquired): Secondary | ICD-10-CM

## 2016-03-29 DIAGNOSIS — R7989 Other specified abnormal findings of blood chemistry: Secondary | ICD-10-CM

## 2016-03-29 DIAGNOSIS — H2511 Age-related nuclear cataract, right eye: Secondary | ICD-10-CM | POA: Diagnosis not present

## 2016-03-29 NOTE — Telephone Encounter (Signed)
Pt came in for lab visit today. Need orders placed. Thank you. Please advise if you would like anything besides TSH.

## 2016-03-29 NOTE — Telephone Encounter (Signed)
Patient was notified it was ready to be picked up via LPN, thanks

## 2016-03-30 ENCOUNTER — Encounter: Payer: Self-pay | Admitting: *Deleted

## 2016-03-30 LAB — TSH: TSH: 5.01 u[IU]/mL — ABNORMAL HIGH (ref 0.35–4.50)

## 2016-03-30 LAB — SURGICAL PATHOLOGY

## 2016-04-01 ENCOUNTER — Telehealth: Payer: Self-pay | Admitting: *Deleted

## 2016-04-01 MED ORDER — LEVOTHYROXINE SODIUM 175 MCG PO TABS: 175.0000 ug | ORAL_TABLET | Freq: Every day | ORAL | Status: DC

## 2016-04-01 NOTE — Telephone Encounter (Signed)
Patient requested to have her lab results from 03/30/16 Pt contact [phone]

## 2016-04-01 NOTE — Telephone Encounter (Signed)
Spoke with patient, see result notes. thanks

## 2016-04-01 NOTE — Assessment & Plan Note (Signed)
TSH imporved from 19 but still 5 on 150 mcg daily,  Increasing to 175 mcg daily  Lab Results  Component Value Date   TSH 5.01* 03/30/2016

## 2016-04-01 NOTE — Addendum Note (Signed)
Addended by: Crecencio Mc on: 04/01/2016 08:22 AM   Modules accepted: Orders, Medications

## 2016-04-04 ENCOUNTER — Ambulatory Visit: Payer: Medicare Other | Admitting: Anesthesiology

## 2016-04-05 ENCOUNTER — Encounter: Payer: Self-pay | Admitting: *Deleted

## 2016-04-05 ENCOUNTER — Encounter
Admission: RE | Disposition: A | Discharge status: Home / Self Care | Payer: Self-pay | Source: Ambulatory Visit | Attending: Ophthalmology

## 2016-04-05 ENCOUNTER — Ambulatory Visit
Admission: RE | Admit: 2016-04-05 | Discharge: 2016-04-05 | Disposition: A | Discharge status: Home / Self Care | Payer: Medicare Other | Source: Ambulatory Visit | Attending: Ophthalmology | Admitting: Ophthalmology

## 2016-04-05 ENCOUNTER — Ambulatory Visit: Payer: Medicare Other | Admitting: Anesthesiology

## 2016-04-05 DIAGNOSIS — Z853 Personal history of malignant neoplasm of breast: Secondary | ICD-10-CM | POA: Diagnosis not present

## 2016-04-05 DIAGNOSIS — I509 Heart failure, unspecified: Secondary | ICD-10-CM | POA: Diagnosis not present

## 2016-04-05 DIAGNOSIS — Z862 Personal history of diseases of the blood and blood-forming organs and certain disorders involving the immune mechanism: Secondary | ICD-10-CM | POA: Diagnosis not present

## 2016-04-05 DIAGNOSIS — Z9981 Dependence on supplemental oxygen: Secondary | ICD-10-CM | POA: Diagnosis not present

## 2016-04-05 DIAGNOSIS — M329 Systemic lupus erythematosus, unspecified: Secondary | ICD-10-CM | POA: Insufficient documentation

## 2016-04-05 DIAGNOSIS — I110 Hypertensive heart disease with heart failure: Secondary | ICD-10-CM | POA: Diagnosis not present

## 2016-04-05 DIAGNOSIS — K219 Gastro-esophageal reflux disease without esophagitis: Secondary | ICD-10-CM | POA: Insufficient documentation

## 2016-04-05 DIAGNOSIS — I739 Peripheral vascular disease, unspecified: Secondary | ICD-10-CM | POA: Insufficient documentation

## 2016-04-05 DIAGNOSIS — G629 Polyneuropathy, unspecified: Secondary | ICD-10-CM | POA: Insufficient documentation

## 2016-04-05 DIAGNOSIS — K449 Diaphragmatic hernia without obstruction or gangrene: Secondary | ICD-10-CM | POA: Insufficient documentation

## 2016-04-05 DIAGNOSIS — H2511 Age-related nuclear cataract, right eye: Secondary | ICD-10-CM | POA: Diagnosis not present

## 2016-04-05 DIAGNOSIS — Z8719 Personal history of other diseases of the digestive system: Secondary | ICD-10-CM | POA: Diagnosis not present

## 2016-04-05 DIAGNOSIS — M1990 Unspecified osteoarthritis, unspecified site: Secondary | ICD-10-CM | POA: Insufficient documentation

## 2016-04-05 DIAGNOSIS — Z87891 Personal history of nicotine dependence: Secondary | ICD-10-CM | POA: Diagnosis not present

## 2016-04-05 DIAGNOSIS — E039 Hypothyroidism, unspecified: Secondary | ICD-10-CM | POA: Insufficient documentation

## 2016-04-05 DIAGNOSIS — I4891 Unspecified atrial fibrillation: Secondary | ICD-10-CM | POA: Diagnosis not present

## 2016-04-05 DIAGNOSIS — M8580 Other specified disorders of bone density and structure, unspecified site: Secondary | ICD-10-CM | POA: Diagnosis not present

## 2016-04-05 DIAGNOSIS — Z85828 Personal history of other malignant neoplasm of skin: Secondary | ICD-10-CM | POA: Insufficient documentation

## 2016-04-05 HISTORY — DX: Unspecified atrial fibrillation: I48.91

## 2016-04-05 HISTORY — DX: Other specified disorders of bone density and structure, unspecified site: M85.80

## 2016-04-05 HISTORY — DX: Polyneuropathy, unspecified: G62.9

## 2016-04-05 HISTORY — PX: CATARACT EXTRACTION W/PHACO: SHX586

## 2016-04-05 HISTORY — DX: Other allergy status, other than to drugs and biological substances: Z91.09

## 2016-04-05 HISTORY — DX: Localized edema: R60.0

## 2016-04-05 HISTORY — DX: Reserved for inherently not codable concepts without codable children: IMO0001

## 2016-04-05 SURGERY — PHACOEMULSIFICATION, CATARACT, WITH IOL INSERTION
Anesthesia: Monitor Anesthesia Care | Site: Eye | Laterality: Right | Wound class: Clean

## 2016-04-05 MED ORDER — MOXIFLOXACIN HCL 0.5 % OP SOLN
OPHTHALMIC | Status: DC | PRN
  Administered 2016-04-05: 1 [drp] via OPHTHALMIC

## 2016-04-05 MED ORDER — MOXIFLOXACIN HCL 0.5 % OP SOLN
OPHTHALMIC | Status: AC
  Filled 2016-04-05: qty 3

## 2016-04-05 MED ORDER — POVIDONE-IODINE 5 % OP SOLN
OPHTHALMIC | Status: AC
  Administered 2016-04-05: 1 via OPHTHALMIC
  Filled 2016-04-05: qty 30

## 2016-04-05 MED ORDER — MIDAZOLAM HCL 2 MG/2ML IJ SOLN
INTRAMUSCULAR | Status: DC | PRN
  Administered 2016-04-05: 1 mg via INTRAVENOUS

## 2016-04-05 MED ORDER — SODIUM CHLORIDE 0.9 % IV SOLN
INTRAVENOUS | Status: DC
  Administered 2016-04-05: 10:00:00 via INTRAVENOUS

## 2016-04-05 MED ORDER — NA CHONDROIT SULF-NA HYALURON 40-17 MG/ML IO SOLN
INTRAOCULAR | Status: AC
  Filled 2016-04-05: qty 1

## 2016-04-05 MED ORDER — FENTANYL CITRATE (PF) 100 MCG/2ML IJ SOLN
INTRAMUSCULAR | Status: DC | PRN
  Administered 2016-04-05 (×2): 25 ug via INTRAVENOUS

## 2016-04-05 MED ORDER — TETRACAINE HCL 0.5 % OP SOLN
1.0000 [drp] | Freq: Once | OPHTHALMIC | Status: AC
Start: 2016-04-05 — End: 2016-04-05
  Administered 2016-04-05: 1 [drp] via OPHTHALMIC

## 2016-04-05 MED ORDER — CEFUROXIME OPHTHALMIC INJECTION 1 MG/0.1 ML
INJECTION | OPHTHALMIC | Status: AC
  Filled 2016-04-05: qty 0.1

## 2016-04-05 MED ORDER — EPINEPHRINE HCL 1 MG/ML IJ SOLN
INTRAMUSCULAR | Status: AC
  Filled 2016-04-05: qty 1

## 2016-04-05 MED ORDER — TETRACAINE HCL 0.5 % OP SOLN
OPHTHALMIC | Status: AC
  Administered 2016-04-05: 1 [drp] via OPHTHALMIC
  Filled 2016-04-05: qty 2

## 2016-04-05 MED ORDER — EPINEPHRINE HCL 1 MG/ML IJ SOLN
INTRAOCULAR | Status: DC | PRN
  Administered 2016-04-05: 1 mL via OPHTHALMIC

## 2016-04-05 MED ORDER — NA CHONDROIT SULF-NA HYALURON 40-17 MG/ML IO SOLN
INTRAOCULAR | Status: DC | PRN
  Administered 2016-04-05: 1 mL via INTRAOCULAR

## 2016-04-05 MED ORDER — MOXIFLOXACIN HCL 0.5 % OP SOLN: 1.0000 [drp] | OPHTHALMIC | Status: DC | PRN

## 2016-04-05 MED ORDER — ARMC OPHTHALMIC DILATING GEL
OPHTHALMIC | Status: AC
  Administered 2016-04-05: 1 via OPHTHALMIC
  Filled 2016-04-05: qty 0.25

## 2016-04-05 MED ORDER — ARMC OPHTHALMIC DILATING GEL
1.0000 "application " | OPHTHALMIC | Status: AC
  Administered 2016-04-05: 1 via OPHTHALMIC

## 2016-04-05 MED ORDER — POVIDONE-IODINE 5 % OP SOLN
1.0000 "application " | Freq: Once | OPHTHALMIC | Status: AC
  Administered 2016-04-05: 1 via OPHTHALMIC

## 2016-04-05 MED ORDER — CARBACHOL 0.01 % IO SOLN
INTRAOCULAR | Status: DC | PRN
  Administered 2016-04-05: .5 mL via INTRAOCULAR

## 2016-04-05 SURGICAL SUPPLY — 21 items
CANNULA ANT/CHMB 27GA (MISCELLANEOUS) ×3 IMPLANT
CUP MEDICINE 2OZ PLAST GRAD ST (MISCELLANEOUS) ×3 IMPLANT
GLOVE BIO SURGEON STRL SZ8 (GLOVE) ×3 IMPLANT
GLOVE BIOGEL M 6.5 STRL (GLOVE) ×3 IMPLANT
GLOVE SURG LX 8.0 MICRO (GLOVE) ×2
GLOVE SURG LX STRL 8.0 MICRO (GLOVE) ×1 IMPLANT
GOWN STRL REUS W/ TWL LRG LVL3 (GOWN DISPOSABLE) ×2 IMPLANT
GOWN STRL REUS W/TWL LRG LVL3 (GOWN DISPOSABLE) ×4
LENS IOL TECNIS ITEC 23.5 (Intraocular Lens) ×3 IMPLANT
PACK CATARACT (MISCELLANEOUS) ×3 IMPLANT
PACK CATARACT BRASINGTON LX (MISCELLANEOUS) ×3 IMPLANT
PACK EYE AFTER SURG (MISCELLANEOUS) ×3 IMPLANT
SOL BSS BAG (MISCELLANEOUS) ×3
SOL PREP PVP 2OZ (MISCELLANEOUS) ×3
SOLUTION BSS BAG (MISCELLANEOUS) ×1 IMPLANT
SOLUTION PREP PVP 2OZ (MISCELLANEOUS) ×1 IMPLANT
SYR 3ML LL SCALE MARK (SYRINGE) ×3 IMPLANT
SYR 5ML LL (SYRINGE) ×3 IMPLANT
SYR TB 1ML 27GX1/2 LL (SYRINGE) ×3 IMPLANT
WATER STERILE IRR 1000ML POUR (IV SOLUTION) ×3 IMPLANT
WIPE NON LINTING 3.25X3.25 (MISCELLANEOUS) ×3 IMPLANT

## 2016-04-05 NOTE — Anesthesia Postprocedure Evaluation (Signed)
Anesthesia Post Note  Patient: Diana [name]  Procedure(s) Performed: Procedure(s) (LRB): CATARACT EXTRACTION PHACO AND INTRAOCULAR LENS PLACEMENT (IOC) (Right)  Patient location during evaluation: PACU Anesthesia Type: MAC Level of consciousness: awake, awake and alert, oriented and patient cooperative Pain management: pain level controlled Vital Signs Assessment: post-procedure vital signs reviewed and stable Respiratory status: spontaneous breathing, nonlabored ventilation and respiratory function stable Cardiovascular status: blood pressure returned to baseline and stable Postop Assessment: no headache, no backache and no signs of nausea or vomiting Anesthetic complications: no    Last Vitals:  Filed Vitals:   04/05/16 0946 04/05/16 1113  BP: 120/72 128/71  Pulse: 79 71  Temp: 37.1 C 36.4 C  Resp: 16 16    Last Pain:  Filed Vitals:   04/05/16 1116  PainSc: 5                  Cristela Felt

## 2016-04-05 NOTE — Discharge Instructions (Signed)
Eye Surgery Discharge Instructions  Expect mild scratchy sensation or mild soreness. DO NOT RUB YOUR EYE!  The day of surgery:  Minimal physical activity, but bed rest is not required  No reading, computer work, or close hand work  No bending, lifting, or straining.  May watch TV  For 24 hours:  No driving, legal decisions, or alcoholic beverages  Safety precautions  Eat anything you prefer: It is better to start with liquids, then soup then solid foods.  _____ Eye patch should be worn until postoperative exam tomorrow.  ____ Solar shield eyeglasses should be worn for comfort in the sunlight/patch while sleeping  Resume all regular medications including aspirin or Coumadin if these were discontinued prior to surgery. You may shower, bathe, shave, or wash your hair. Tylenol may be taken for mild discomfort.  Call your doctor if you experience significant pain, nausea, or vomiting, fever > 101 or other signs of infection. [phone] or [phone] Specific instructions:  Follow-up Information    Follow up with Tim Lair, MD.   Specialty:  Ophthalmology   Why:  04/06/16 at 10:45   Contact information:   [address] [place] [address] [phone]      Eye Surgery Discharge Instructions  Expect mild scratchy sensation or mild soreness. DO NOT RUB YOUR EYE!  The day of surgery:  Minimal physical activity, but bed rest is not required  No reading, computer work, or close hand work  No bending, lifting, or straining.  May watch TV  For 24 hours:  No driving, legal decisions, or alcoholic beverages  Safety precautions  Eat anything you prefer: It is better to start with liquids, then soup then solid foods.  _____ Eye patch should be worn until postoperative exam tomorrow.  ____ Solar shield eyeglasses should be worn for comfort in the sunlight/patch while sleeping  Resume all regular medications including aspirin or Coumadin if  these were discontinued prior to surgery. You may shower, bathe, shave, or wash your hair. Tylenol may be taken for mild discomfort.  Call your doctor if you experience significant pain, nausea, or vomiting, fever > 101 or other signs of infection. [phone] or [phone] Specific instructions:  Follow-up Information    Follow up with Tim Lair, MD.   Specialty:  Ophthalmology   Why:  04/06/16 at 10:45   Contact information:   [place] [address] [phone]

## 2016-04-05 NOTE — Op Note (Signed)
PREOPERATIVE DIAGNOSIS:  Nuclear sclerotic cataract of the right eye.   POSTOPERATIVE DIAGNOSIS:  nuclear sclerotic cataract right eye   OPERATIVE PROCEDURE: Procedure(s): CATARACT EXTRACTION PHACO AND INTRAOCULAR LENS PLACEMENT (IOC)   SURGEON:  Birder Robson, MD.   ANESTHESIA:  Anesthesiologist: Gunnar Fusi, MD CRNA: Alda Berthold, CRNA  1.      Managed anesthesia care. 2.      Topical tetracaine drops followed by 2% Xylocaine jelly applied in the preoperative holding area.   COMPLICATIONS:  None.   TECHNIQUE:   Stop and chop   DESCRIPTION OF PROCEDURE:  The patient was examined and consented in the preoperative holding area where the aforementioned topical anesthesia was applied to the right eye and then brought back to the Operating Room where the right eye was prepped and draped in the usual sterile ophthalmic fashion and a lid speculum was placed. A paracentesis was created with the side port blade and the anterior chamber was filled with viscoelastic. A near clear corneal incision was performed with the steel keratome. A continuous curvilinear capsulorrhexis was performed with a cystotome followed by the capsulorrhexis forceps. Hydrodissection and hydrodelineation were carried out with BSS on a blunt cannula. The lens was removed in a stop and chop  technique and the remaining cortical material was removed with the irrigation-aspiration handpiece. The capsular bag was inflated with viscoelastic and the Technis [id number]  lens was placed in the capsular bag without complication. The remaining viscoelastic was removed from the eye with the irrigation-aspiration handpiece. The wounds were hydrated. The anterior chamber was flushed with Miostat and the eye was inflated to physiologic pressure. 0.2 mL of Vigamox diluted three/one with BSS was placed in the anterior chamber. The wounds were found to be water tight. The eye was dressed with Vigamox. The patient was given protective glasses  to wear throughout the day and a shield with which to sleep tonight. The patient was also given drops with which to begin a drop regimen today and will follow-up with me in one day.  Implant Name Type Inv. Item Serial No. Manufacturer Lot No. LRB No. Used  LENS IOL DIOP 23.5 - [id number] Intraocular Lens LENS IOL DIOP 23.5 [id number]   Right 1   Procedure(s) with comments: CATARACT EXTRACTION PHACO AND INTRAOCULAR LENS PLACEMENT (IOC) (Right) - [place] 01:20 AP% 23.9 CDE 19.29 fluid pack lot # [id number]  Electronically signed: [place] 04/05/2016 11:13 AM

## 2016-04-05 NOTE — H&P (Signed)
  All labs reviewed. Abnormal studies sent to patients PCP when indicated.  Previous H&P reviewed, patient examined, there are NO CHANGES.  Diana [name] LOUIS7/18/201710:40 AM

## 2016-04-05 NOTE — Anesthesia Preprocedure Evaluation (Signed)
Anesthesia Evaluation  Patient identified by MRN, date of birth, ID band Patient awake    Reviewed: Allergy & Precautions, NPO status , Patient's Chart, lab work & pertinent test results  History of Anesthesia Complications Negative for: history of anesthetic complications  Airway Mallampati: II       Dental  (+) Partial Upper   Pulmonary neg pulmonary ROS, former smoker,           Cardiovascular hypertension, Pt. on medications + Peripheral Vascular Disease and +CHF  + dysrhythmias Supra Ventricular Tachycardia      Neuro/Psych negative neurological ROS     GI/Hepatic Neg liver ROS, hiatal hernia, GERD  Medicated and Controlled,  Endo/Other  Hypothyroidism   Renal/GU Renal Insufficiency     Musculoskeletal  (+) Arthritis , Osteoarthritis,    Abdominal   Peds  Hematology  (+) anemia ,   Anesthesia Other Findings   Reproductive/Obstetrics                             Anesthesia Physical Anesthesia Plan  ASA: III  Anesthesia Plan: MAC   Post-op Pain Management:    Induction: Intravenous  Airway Management Planned: Nasal Cannula  Additional Equipment:   Intra-op Plan:   Post-operative Plan:   Informed Consent: I have reviewed the patients History and Physical, chart, labs and discussed the procedure including the risks, benefits and alternatives for the proposed anesthesia with the patient or authorized representative who has indicated his/her understanding and acceptance.     Plan Discussed with:   Anesthesia Plan Comments:         Anesthesia Quick Evaluation

## 2016-04-05 NOTE — Transfer of Care (Signed)
Immediate Anesthesia Transfer of Care Note  Patient: Diana [name]  Procedure(s) Performed: Procedure(s) with comments: CATARACT EXTRACTION PHACO AND INTRAOCULAR LENS PLACEMENT (IOC) (Right) - [place] 01:20 AP% 23.9 CDE 19.29 fluid pack lot # [id number]  Patient Location: PACU  Anesthesia Type:MAC  Level of Consciousness: awake, alert , oriented and patient cooperative  Airway & Oxygen Therapy: Patient Spontanous Breathing  Post-op Assessment: Report given to RN, Post -op Vital signs reviewed and stable and Patient moving all extremities  Post vital signs: Reviewed and stable  Last Vitals:  Filed Vitals:   04/05/16 0946 04/05/16 1113  BP: 120/72 128/71  Pulse: 79 71  Temp: 37.1 C 36.4 C  Resp: 16 16    Last Pain:  Filed Vitals:   04/05/16 1116  PainSc: 5          Complications: No apparent anesthesia complications

## 2016-04-12 ENCOUNTER — Other Ambulatory Visit: Payer: Self-pay | Admitting: *Deleted

## 2016-04-12 ENCOUNTER — Encounter: Payer: Self-pay | Admitting: *Deleted

## 2016-04-12 NOTE — Patient Outreach (Signed)
[facility]) Care Management  04/12/2016   Diana [name] 05-14-1935 [id number]  Subjective: RN Health Coach telephone call to patient.  Hipaa compliance verified. Per patient she had her right eye cataract surgery on 07/18 and the left  cataract surgery is scheduled for  0808. Dennie Bible is able to see but has difficulty reading. Patient weight today is 120 pounds. RN discussed the zones with patient and per patient she is in the green zone. Patient has not had any  lower extremity swelling, wheezing or chest pain . Per patient she is staying out of the heat so she  hasn't been outside. Patient has agreed to follow up outreach calls and will review with next follow up after patient surgery if she is able to read.  Objective:   Current Medications:  Current Outpatient Prescriptions  Medication Sig Dispense Refill  . acetaminophen (TYLENOL) 325 MG tablet Take 650 mg by mouth every 4 (four) hours as needed for mild pain or fever. Reported on 02/23/2016    . ALPRAZolam (XANAX) 0.5 MG tablet Take 0.5 mg by mouth at bedtime as needed for anxiety. Reported on 04/05/2016    . Ascorbic Acid (VITAMIN C) 1000 MG tablet Take 1,000 mg by mouth daily.     . budesonide-formoterol (SYMBICORT) 160-4.5 MCG/ACT inhaler Inhale 2 puffs into the lungs 2 (two) times daily. Reported on 04/05/2016    . chlorpheniramine-HYDROcodone (TUSSIONEX PENNKINETIC ER) 10-8 MG/5ML SUER Take 5 mLs by mouth at bedtime as needed for cough. (Patient not taking: Reported on 03/24/2016) 140 mL 0  . cholecalciferol (VITAMIN D) 400 UNITS TABS tablet Take 400 Units by mouth.    . cyanocobalamin (,VITAMIN B-12,) 1000 MCG/ML injection Inject 1 mL (1,000 mcg total) into the muscle every 30 (thirty) days. Pt uses on the 1st of every month. 10 mL 1  . furosemide (LASIX) 20 MG tablet Take 20 mg by mouth 2 (two) times daily.    . hydroxychloroquine (PLAQUENIL) 200 MG tablet Take 200 mg by mouth daily.    Marland Kitchen letrozole (FEMARA) 2.5 MG tablet  Take 1 tablet (2.5 mg total) by mouth daily. 90 tablet 3  . levocetirizine (XYZAL) 5 MG tablet Take 5 mg by mouth daily.    Marland Kitchen levothyroxine (SYNTHROID) 175 MCG tablet Take 1 tablet (175 mcg total) by mouth daily before breakfast. 90 tablet 1  . loperamide (IMODIUM) 2 MG capsule Take 2-4 mg by mouth as needed for diarrhea or loose stools. Reported on 04/05/2016    . metoCLOPramide (REGLAN) 10 MG tablet Take 1 tablet (10 mg total) by mouth 4 (four) times daily -  before meals and at bedtime. 120 tablet 5  . metoprolol succinate (TOPROL-XL) 25 MG 24 hr tablet Take 12.5 mg by mouth daily.     . mycophenolate (CELLCEPT) 500 MG tablet Take 1,000 mg by mouth 2 (two) times daily.    . ondansetron (ZOFRAN-ODT) 4 MG disintegrating tablet Take 4 mg by mouth every 4 (four) hours as needed for nausea or vomiting.    Marland Kitchen oxyCODONE-acetaminophen (PERCOCET/ROXICET) 5-325 MG tablet Take 1 tablet by mouth every 8 (eight) hours as needed for severe pain. 90 tablet 0  . OXYGEN Inhale 2 mLs into the lungs at bedtime.    . pantoprazole sodium (PROTONIX) 40 mg/20 mL PACK Place 40 mg into feeding tube 2 (two) times daily.    . Polyvinyl Alcohol-Povidone (REFRESH OP) Place 2 drops into both eyes as needed.    . predniSONE (DELTASONE) 5 MG  tablet Take 5 mg by mouth daily.     . sucralfate (CARAFATE) 1 G tablet Take 1 tablet (1 g total) by mouth 4 (four) times daily -  with meals and at bedtime. 120 tablet 0  . Tadalafil, PAH, (ADCIRCA) 20 MG TABS Take 40 mg by mouth daily.    . Water For Irrigation, Sterile (FREE WATER) SOLN Place 150 mLs into feeding tube 2 (two) times daily. 1500 mL 2   Current Facility-Administered Medications  Medication Dose Route Frequency Provider Last Rate Last Dose  . midazolam (VERSED) injection 5 mg  5 mg Intravenous Once Yevette Edwards, MD        Functional Status:  In your present state of health, do you have any difficulty performing the following activities: 04/12/2016 02/02/2016  Hearing? N  N  Vision? Y Y  Difficulty concentrating or making decisions? N N  Walking or climbing stairs? Y Y  Dressing or bathing? N N  Doing errands, shopping? [name]  Preparing Food and eating ? N N  Using the Toilet? N N  In the past six months, have you accidently leaked urine? N N  Do you have problems with loss of bowel control? N N  Managing your Medications? N N  Managing your Finances? N N  Housekeeping or managing your Housekeeping? [name]  Some recent data might be hidden    Fall/Depression Screening: PHQ 2/9 Scores 04/12/2016 03/15/2016 02/24/2016 02/02/2016 01/25/2016 01/06/2016 12/11/2015  PHQ - 2 Score 0 0 0 0 - 0 0  Exception Documentation - - - - Patient refusal - -   THN CM Care Plan Problem One   Flowsheet Row Most Recent Value  Care Plan Problem One  Knowledge deficit in self management of congestive heart falure  Role Documenting the Problem One  Health Coach  Care Plan for Problem One  Active  THN Long Term Goal (31-90 days)  Patient will not have any admissions for CHF within the next 90 days  THN Long Term Goal Start Date  04/12/16  Interventions for Problem One Long Term Goal  RN reminded patient to keep appointments with PCP and other physician that patient is seeing. Patient reminded the importance of taking medications as per order. RN Health Coach will monitor patient monthly telephonic.Patient is also following up with wellness checks  THN CM Short Term Goal #1 (0-30 days)  Patient will be able to verbalize 3 signs ans symptoms of CHF within the next 30 days  THN CM Short Term Goal #1 Start Date  04/12/16  Interventions for Short Term Goal #1  RN sent educational material on Congestive heart failure zones. RN will send a large magnet for CHF to place on refrigerator. RN will follow up with discussion and teachback.  THN CM Short Term Goal #2 (0-30 days)  Patient will weigh daily and record in log within the next 30 days  THN CM Short Term Goal #2 Start Date  04/12/16  [facility] CM  Short Term Goal #2 Met Date  04/12/16  Interventions for Short Term Goal #2  RN Health Coach  sent patient a calendar book for documentation.  RN discussed the importance of weighing same time each day.RN discussed the importance of weighing and recording any weight gain of 3 pounds in a day or 5 pounds in a week.  Patient is to notify doctor.      Assessment:  Patient has had rt eye cataract surgery and the left eye is scheduled  for 08/08 Patient will continue to  benefit from Health Coach telephonic outreach for education and support for congestive Heart failure management.  Plan:  RN discussed the zones verbally with patient RN will follow up within the month of August  Gean Maidens BSN RN [facility] Care Management [phone]

## 2016-04-13 ENCOUNTER — Ambulatory Visit: Payer: Medicare Other | Attending: Anesthesiology | Admitting: Anesthesiology

## 2016-04-13 ENCOUNTER — Encounter: Payer: Self-pay | Admitting: Anesthesiology

## 2016-04-13 DIAGNOSIS — M5136 Other intervertebral disc degeneration, lumbar region: Secondary | ICD-10-CM | POA: Diagnosis not present

## 2016-04-13 DIAGNOSIS — M5441 Lumbago with sciatica, right side: Secondary | ICD-10-CM | POA: Insufficient documentation

## 2016-04-13 DIAGNOSIS — M545 Low back pain: Secondary | ICD-10-CM | POA: Diagnosis not present

## 2016-04-13 DIAGNOSIS — M47817 Spondylosis without myelopathy or radiculopathy, lumbosacral region: Secondary | ICD-10-CM

## 2016-04-13 DIAGNOSIS — M4806 Spinal stenosis, lumbar region: Secondary | ICD-10-CM | POA: Insufficient documentation

## 2016-04-13 DIAGNOSIS — M1288 Other specific arthropathies, not elsewhere classified, other specified site: Secondary | ICD-10-CM | POA: Insufficient documentation

## 2016-04-13 MED ORDER — TRIAMCINOLONE ACETONIDE 40 MG/ML IJ SUSP
INTRAMUSCULAR | Status: AC
  Administered 2016-04-13: 15:00:00
  Filled 2016-04-13: qty 1

## 2016-04-13 MED ORDER — FENTANYL CITRATE (PF) 100 MCG/2ML IJ SOLN
INTRAMUSCULAR | Status: AC
  Filled 2016-04-13: qty 2

## 2016-04-13 MED ORDER — MIDAZOLAM HCL 5 MG/5ML IJ SOLN
INTRAMUSCULAR | Status: AC
  Administered 2016-04-13: 1.5 mg
  Filled 2016-04-13: qty 5

## 2016-04-13 MED ORDER — ROPIVACAINE HCL 2 MG/ML IJ SOLN
INTRAMUSCULAR | Status: AC
  Administered 2016-04-13: 15:00:00
  Filled 2016-04-13: qty 10

## 2016-04-13 NOTE — Progress Notes (Signed)
Patient here for procedure visit d/t low back pain.  Right lumbar facet steroid injection.  Safety precautions to be maintained throughout the outpatient stay will include: orient to surroundings, keep bed in low position, maintain call bell within reach at all times, provide assistance with transfer out of bed and ambulation.

## 2016-04-13 NOTE — Patient Instructions (Signed)

## 2016-04-14 ENCOUNTER — Ambulatory Visit: Payer: Medicare Other | Admitting: *Deleted

## 2016-04-14 ENCOUNTER — Telehealth: Payer: Self-pay | Admitting: *Deleted

## 2016-04-14 NOTE — Telephone Encounter (Signed)
Voicemail left for patient to call our office if there are questions or concerns re; procedure on yesterday.  

## 2016-04-16 NOTE — Progress Notes (Signed)
Subjective:  Patient ID: Diana [name], female    DOB: 1935-07-22  Age: 81 y.o. MRN: [id number]  CC: Back Pain (lower)  Procedure: Right sided #3 at L3-4 or L4-5 and L5-S1 and S1 medial nerve branch lock under fluoroscopic guidance with moderate sedation  HPI Diana [name] presents for reevaluation.. At her last visit she had her second facet block and reports a70% improvement and much better relief with that. She states that she is sleeping better and able to stand for longer periods of time with less pain. Overall she feels like she has made good progress. Otherwise this point she is in her usual state of health. She presents today requesting a repeat injection as she has failed to gain significant improvement with previous conservative therapy and that her overall progress is advancing with the facet injections.  Outpatient Medications Prior to Visit  Medication Sig Dispense Refill  . acetaminophen (TYLENOL) 325 MG tablet Take 650 mg by mouth every 4 (four) hours as needed for mild pain or fever. Reported on 02/23/2016    . ALPRAZolam (XANAX) 0.5 MG tablet Take 0.5 mg by mouth at bedtime as needed for anxiety. Reported on 04/05/2016    . Ascorbic Acid (VITAMIN C) 1000 MG tablet Take 1,000 mg by mouth daily.     . budesonide-formoterol (SYMBICORT) 160-4.5 MCG/ACT inhaler Inhale 2 puffs into the lungs 2 (two) times daily. Reported on 04/05/2016    . cholecalciferol (VITAMIN D) 400 UNITS TABS tablet Take 400 Units by mouth.    . cyanocobalamin (,VITAMIN B-12,) 1000 MCG/ML injection Inject 1 mL (1,000 mcg total) into the muscle every 30 (thirty) days. Pt uses on the 1st of every month. 10 mL 1  . furosemide (LASIX) 20 MG tablet Take 20 mg by mouth 2 (two) times daily.    . hydroxychloroquine (PLAQUENIL) 200 MG tablet Take 200 mg by mouth daily.    Marland Kitchen letrozole (FEMARA) 2.5 MG tablet Take 1 tablet (2.5 mg total) by mouth daily. 90 tablet 3  . levocetirizine (XYZAL) 5 MG tablet Take 5 mg by mouth  daily.    Marland Kitchen levothyroxine (SYNTHROID) 175 MCG tablet Take 1 tablet (175 mcg total) by mouth daily before breakfast. 90 tablet 1  . loperamide (IMODIUM) 2 MG capsule Take 2-4 mg by mouth as needed for diarrhea or loose stools. Reported on 04/05/2016    . metoCLOPramide (REGLAN) 10 MG tablet Take 1 tablet (10 mg total) by mouth 4 (four) times daily -  before meals and at bedtime. 120 tablet 5  . metoprolol succinate (TOPROL-XL) 25 MG 24 hr tablet Take 12.5 mg by mouth daily.     . mycophenolate (CELLCEPT) 500 MG tablet Take 1,000 mg by mouth 2 (two) times daily.    . ondansetron (ZOFRAN-ODT) 4 MG disintegrating tablet Take 4 mg by mouth every 4 (four) hours as needed for nausea or vomiting.    Marland Kitchen oxyCODONE-acetaminophen (PERCOCET/ROXICET) 5-325 MG tablet Take 1 tablet by mouth every 8 (eight) hours as needed for severe pain. 90 tablet 0  . OXYGEN Inhale 2 mLs into the lungs at bedtime.    . pantoprazole sodium (PROTONIX) 40 mg/20 mL PACK Place 40 mg into feeding tube 2 (two) times daily.    . Polyvinyl Alcohol-Povidone (REFRESH OP) Place 2 drops into both eyes as needed.    . predniSONE (DELTASONE) 5 MG tablet Take 5 mg by mouth daily.     . sucralfate (CARAFATE) 1 G tablet Take 1 tablet (1  g total) by mouth 4 (four) times daily -  with meals and at bedtime. 120 tablet 0  . Tadalafil, PAH, (ADCIRCA) 20 MG TABS Take 40 mg by mouth daily.    . Water For Irrigation, Sterile (FREE WATER) SOLN Place 150 mLs into feeding tube 2 (two) times daily. 1500 mL 2  . chlorpheniramine-HYDROcodone (TUSSIONEX PENNKINETIC ER) 10-8 MG/5ML SUER Take 5 mLs by mouth at bedtime as needed for cough. (Patient not taking: Reported on 03/24/2016) 140 mL 0   Facility-Administered Medications Prior to Visit  Medication Dose Route Frequency Provider Last Rate Last Dose  . midazolam (VERSED) injection 5 mg  5 mg Intravenous Once Yevette Edwards, MD       ROS Review of Systems  Objective:  BP 133/66   Pulse 77   Temp 98.6 F (37  C)   Resp 18   Ht 5\' 3"  (1.6 m)   Wt 118 lb (53.5 kg)   SpO2 96%   BMI 20.90 kg/m    BP Readings from Last 3 Encounters:  04/13/16 133/66  04/05/16 128/69  02/24/16 120/68     Wt Readings from Last 3 Encounters:  04/13/16 118 lb (53.5 kg)  04/05/16 119 lb (54 kg)  03/15/16 120 lb (54.4 kg)     Physical Exam  PERRL EOMI HEART IS RRR  LCTA MUSCULOSKELETAL her straight leg raise is negative. She does have significant pain with extension and right lateral rotation this does appear to be improving. This is greater than with left lateral rotation. No overt trigger points are noted  Labs  Lab Results  Component Value Date   HGBA1C 5.9 07/03/2015   HGBA1C 7.1 (H) 11/19/2014   HGBA1C 6.9 (H) 04/22/2014   Lab Results  Component Value Date   MICROALBUR <0.7 11/28/2014   LDLCALC 64 10/11/2013   CREATININE 1.05 (H) 02/04/2016    -------------------------------------------------------------------------------------------------------------------- Lab Results  Component Value Date   WBC 11.7 (H) 02/04/2016   HGB 12.0 02/04/2016   HCT 36.6 02/04/2016   PLT 307 02/04/2016   GLUCOSE 145 (H) 02/04/2016   CHOL 164 10/11/2013   TRIG 152.0 (H) 10/11/2013   HDL 69.70 10/11/2013   LDLDIRECT 28.0 07/03/2015   LDLCALC 64 10/11/2013   ALT 24 02/04/2016   AST 31 02/04/2016   NA 138 02/04/2016   K 3.9 02/04/2016   CL 102 02/04/2016   CREATININE 1.05 (H) 02/04/2016   BUN 37 (H) 02/04/2016   CO2 28 02/04/2016   TSH 5.01 (H) 03/30/2016   INR 1.22 06/10/2015   HGBA1C 5.9 07/03/2015   MICROALBUR <0.7 11/28/2014    --------------------------------------------------------------------------------------------------------------------- Mm Diag Breast Tomo Bilateral  09/24/2015  CLINICAL DATA:  81 year old female presenting for routine postlumpectomy follow-up. The patient has had a left breast lumpectomy in 2015 followed by radiation therapy. EXAM: DIGITAL DIAGNOSTIC BILATERAL  MAMMOGRAM WITH 3D TOMOSYNTHESIS AND CAD COMPARISON:  Previous exam(s). ACR Breast Density Category d: The breast tissue is extremely dense, which lowers the sensitivity of mammography. FINDINGS: There is a marked increase in apparent density of the breast bilaterally, due to marked weight loss in the since the prior mammogram. Interval changes in the upper-outer quadrant of the left breast, consistent with postlumpectomy changes. No suspicious calcifications, masses or areas of distortion are seen in the bilateral breasts. Mammographic images were processed with CAD. IMPRESSION: 1. Interval left breast lumpectomy, without evidence of disease recurrence. 2.  No mammographic evidence of malignancy in the bilateral breasts. 3. Marked weight loss causing increased  density of the bilateral breasts. RECOMMENDATION: Diagnostic mammogram is suggested in 1 year. (Code:[id number]-01Y) I have discussed the findings and recommendations with the patient. Results were also provided in writing at the conclusion of the visit. If applicable, a reminder letter will be sent to the patient regarding the next appointment. BI-RADS CATEGORY  2: Benign. Electronically Signed   By: Frederico Hamman M.D.   On: 09/24/2015 13:56     Assessment & Plan:   Jilliam was seen today for back pain.  Diagnoses and all orders for this visit:  Facet arthritis of lumbosacral region -     LUMBAR FACET(MEDIAL BRANCH NERVE BLOCK) MBNB  Other orders -     LUMBAR FACET(MEDIAL BRANCH NERVE BLOCK) MBNB; Future -     ropivacaine (PF) 2 mg/ml (0.2%) (NAROPIN) 2 MG/ML epidural;  -     triamcinolone acetonide (KENALOG-40) 40 MG/ML injection;  -     fentaNYL (SUBLIMAZE) 100 MCG/2ML injection;  -     midazolam (VERSED) 5 MG/5ML injection;         ----------------------------------------------------------------------------------------------------------------------  Problem List Items Addressed This Visit      Musculoskeletal and Integument    Facet arthritis of lumbosacral region   Relevant Medications   triamcinolone acetonide (KENALOG-40) 40 MG/ML injection (Completed)    Other Visit Diagnoses   None.       ----------------------------------------------------------------------------------------------------------------------  1.  lumbar facet arthropathy and DDD (degenerative disc disease), lumbar We'll plan on aRepeat facet injection right side for  therapeutic purpose today. Of note there was some confusion in the documentation which was further clarified and she presents today for her third injection in her facet trial. This is contrary to the dictation noted on her last evaluation. The risks and benefits of the procedure have been described in full detail.   2. Spinal stenosis of lumbar region   3. Sciatica associated with disorder of lumbar spine, right     ----------------------------------------------------------------------------------------------------------------------  I am having Ms. Douglass maintain her hydroxychloroquine, tadalafil (PAH), acetaminophen, loperamide, levocetirizine, predniSONE, pantoprazole sodium, free water, sucralfate, cholecalciferol, metoprolol succinate, budesonide-formoterol, chlorpheniramine-HYDROcodone, metoCLOPramide, cyanocobalamin, mycophenolate, letrozole, Polyvinyl Alcohol-Povidone (REFRESH OP), vitamin C, OXYGEN, oxyCODONE-acetaminophen, furosemide, ondansetron, ALPRAZolam, levothyroxine, and sterile water for irrigation 237 mL. We administered ropivacaine (PF) 2 mg/ml (0.2%), triamcinolone acetonide, and midazolam. We will continue to administer midazolam.   Meds ordered this encounter  Medications  . ropivacaine (PF) 2 mg/ml (0.2%) (NAROPIN) 2 MG/ML epidural    GARNER, CYNTHIA: cabinet override  . triamcinolone acetonide (KENALOG-40) 40 MG/ML injection    GARNER, CYNTHIA: cabinet override  . fentaNYL (SUBLIMAZE) 100 MCG/2ML injection    GARNER, CYNTHIA: cabinet override  .  midazolam (VERSED) 5 MG/5ML injection    GARNER, CYNTHIA: cabinet override  . sterile water for irrigation 237 mL    Sig: Give 2.5 mL/hr by tube continuous.   Patient's Medications  New Prescriptions   No medications on file  Previous Medications   ACETAMINOPHEN (TYLENOL) 325 MG TABLET    Take 650 mg by mouth every 4 (four) hours as needed for mild pain or fever. Reported on 02/23/2016   ALPRAZOLAM (XANAX) 0.5 MG TABLET    Take 0.5 mg by mouth at bedtime as needed for anxiety. Reported on 04/05/2016   ASCORBIC ACID (VITAMIN C) 1000 MG TABLET    Take 1,000 mg by mouth daily.    BUDESONIDE-FORMOTEROL (SYMBICORT) 160-4.5 MCG/ACT INHALER    Inhale 2 puffs into the lungs 2 (two) times daily. Reported on 04/05/2016   CHLORPHENIRAMINE-HYDROCODONE (  TUSSIONEX PENNKINETIC ER) 10-8 MG/5ML SUER    Take 5 mLs by mouth at bedtime as needed for cough.   CHOLECALCIFEROL (VITAMIN D) 400 UNITS TABS TABLET    Take 400 Units by mouth.   CYANOCOBALAMIN (,VITAMIN B-12,) 1000 MCG/ML INJECTION    Inject 1 mL (1,000 mcg total) into the muscle every 30 (thirty) days. Pt uses on the 1st of every month.   FUROSEMIDE (LASIX) 20 MG TABLET    Take 20 mg by mouth 2 (two) times daily.   HYDROXYCHLOROQUINE (PLAQUENIL) 200 MG TABLET    Take 200 mg by mouth daily.   LETROZOLE (FEMARA) 2.5 MG TABLET    Take 1 tablet (2.5 mg total) by mouth daily.   LEVOCETIRIZINE (XYZAL) 5 MG TABLET    Take 5 mg by mouth daily.   LEVOTHYROXINE (SYNTHROID) 175 MCG TABLET    Take 1 tablet (175 mcg total) by mouth daily before breakfast.   LOPERAMIDE (IMODIUM) 2 MG CAPSULE    Take 2-4 mg by mouth as needed for diarrhea or loose stools. Reported on 04/05/2016   METOCLOPRAMIDE (REGLAN) 10 MG TABLET    Take 1 tablet (10 mg total) by mouth 4 (four) times daily -  before meals and at bedtime.   METOPROLOL SUCCINATE (TOPROL-XL) 25 MG 24 HR TABLET    Take 12.5 mg by mouth daily.    MYCOPHENOLATE (CELLCEPT) 500 MG TABLET    Take 1,000 mg by mouth 2 (two) times  daily.   ONDANSETRON (ZOFRAN-ODT) 4 MG DISINTEGRATING TABLET    Take 4 mg by mouth every 4 (four) hours as needed for nausea or vomiting.   OXYCODONE-ACETAMINOPHEN (PERCOCET/ROXICET) 5-325 MG TABLET    Take 1 tablet by mouth every 8 (eight) hours as needed for severe pain.   OXYGEN    Inhale 2 mLs into the lungs at bedtime.   PANTOPRAZOLE SODIUM (PROTONIX) 40 MG/20 ML PACK    Place 40 mg into feeding tube 2 (two) times daily.   POLYVINYL ALCOHOL-POVIDONE (REFRESH OP)    Place 2 drops into both eyes as needed.   PREDNISONE (DELTASONE) 5 MG TABLET    Take 5 mg by mouth daily.    STERILE WATER FOR IRRIGATION 237 ML    Give 2.5 mL/hr by tube continuous.   SUCRALFATE (CARAFATE) 1 G TABLET    Take 1 tablet (1 g total) by mouth 4 (four) times daily -  with meals and at bedtime.   TADALAFIL, PAH, (ADCIRCA) 20 MG TABS    Take 40 mg by mouth daily.   WATER FOR IRRIGATION, STERILE (FREE WATER) SOLN    Place 150 mLs into feeding tube 2 (two) times daily.  Modified Medications   No medications on file  Discontinued Medications   No medications on file   ----------------------------------------------------------------------------------------------------------------------  Follow-up: Return for evaluation.   Procedure:    Patient was taken to the fluoroscopy suite and placed in prone position. A total dose of 2 mg of Versed with half cc of fentanyl were titrated for moderate sedation. Vital signs are stable throughout the procedure. The area overlying the aforementioned facets were prepped with Betadine 3 and strict aseptic technique was utilized throughout the procedure. I Identified the areas overlying the aforementioned right facets at  L3-4 or L4-5 L5-S1. 1% lidocaine 1 cc was infiltrated subcutaneous and into the fascia with a 25-gauge needle at each of these sites. I then advanced a 22-gauge 3-1/2 inch Quinckie needle with the needle tip to lie at the  [employer] portion of the [employer] dog". There  was negative aspiration for heme or CSF and  no paresthesia. I then injected 2 cc of ropivacaine 0.2% mixed with 8 mg of triamcinolone at each of the aforementioned sites. These needles were withdrawn and the L5-S1 needle was redirected towards the right S1 posterior foramen. This was once again no paresthesia, negative aspiration and 2 cc of this same mixture was injected at this site. The patient was convalesced discharged home stable condition for follow-up as mentioned. Yevette Edwards, MD

## 2016-04-18 DIAGNOSIS — H2512 Age-related nuclear cataract, left eye: Secondary | ICD-10-CM | POA: Diagnosis not present

## 2016-04-19 ENCOUNTER — Encounter: Payer: Self-pay | Admitting: *Deleted

## 2016-04-26 ENCOUNTER — Ambulatory Visit: Payer: Medicare Other | Admitting: Certified Registered Nurse Anesthetist

## 2016-04-26 ENCOUNTER — Ambulatory Visit
Admission: RE | Admit: 2016-04-26 | Discharge: 2016-04-26 | Disposition: A | Discharge status: Home / Self Care | Payer: Medicare Other | Source: Ambulatory Visit | Attending: Ophthalmology | Admitting: Ophthalmology

## 2016-04-26 ENCOUNTER — Encounter: Payer: Self-pay | Admitting: *Deleted

## 2016-04-26 ENCOUNTER — Encounter
Admission: RE | Disposition: A | Discharge status: Home / Self Care | Payer: Self-pay | Source: Ambulatory Visit | Attending: Ophthalmology

## 2016-04-26 DIAGNOSIS — Z9010 Acquired absence of unspecified breast and nipple: Secondary | ICD-10-CM | POA: Diagnosis not present

## 2016-04-26 DIAGNOSIS — D649 Anemia, unspecified: Secondary | ICD-10-CM | POA: Diagnosis not present

## 2016-04-26 DIAGNOSIS — Z9981 Dependence on supplemental oxygen: Secondary | ICD-10-CM | POA: Diagnosis not present

## 2016-04-26 DIAGNOSIS — Z885 Allergy status to narcotic agent status: Secondary | ICD-10-CM | POA: Diagnosis not present

## 2016-04-26 DIAGNOSIS — Z79899 Other long term (current) drug therapy: Secondary | ICD-10-CM | POA: Diagnosis not present

## 2016-04-26 DIAGNOSIS — E039 Hypothyroidism, unspecified: Secondary | ICD-10-CM | POA: Insufficient documentation

## 2016-04-26 DIAGNOSIS — H2512 Age-related nuclear cataract, left eye: Secondary | ICD-10-CM | POA: Diagnosis not present

## 2016-04-26 DIAGNOSIS — Z8679 Personal history of other diseases of the circulatory system: Secondary | ICD-10-CM | POA: Diagnosis not present

## 2016-04-26 DIAGNOSIS — I509 Heart failure, unspecified: Secondary | ICD-10-CM | POA: Insufficient documentation

## 2016-04-26 DIAGNOSIS — Z8711 Personal history of peptic ulcer disease: Secondary | ICD-10-CM | POA: Insufficient documentation

## 2016-04-26 DIAGNOSIS — K219 Gastro-esophageal reflux disease without esophagitis: Secondary | ICD-10-CM | POA: Diagnosis not present

## 2016-04-26 DIAGNOSIS — Z9889 Other specified postprocedural states: Secondary | ICD-10-CM | POA: Diagnosis not present

## 2016-04-26 DIAGNOSIS — Z853 Personal history of malignant neoplasm of breast: Secondary | ICD-10-CM | POA: Insufficient documentation

## 2016-04-26 DIAGNOSIS — M8580 Other specified disorders of bone density and structure, unspecified site: Secondary | ICD-10-CM | POA: Diagnosis not present

## 2016-04-26 DIAGNOSIS — Z882 Allergy status to sulfonamides status: Secondary | ICD-10-CM | POA: Insufficient documentation

## 2016-04-26 DIAGNOSIS — Z9849 Cataract extraction status, unspecified eye: Secondary | ICD-10-CM | POA: Insufficient documentation

## 2016-04-26 DIAGNOSIS — K449 Diaphragmatic hernia without obstruction or gangrene: Secondary | ICD-10-CM | POA: Insufficient documentation

## 2016-04-26 DIAGNOSIS — Z9109 Other allergy status, other than to drugs and biological substances: Secondary | ICD-10-CM | POA: Insufficient documentation

## 2016-04-26 DIAGNOSIS — M1990 Unspecified osteoarthritis, unspecified site: Secondary | ICD-10-CM | POA: Diagnosis not present

## 2016-04-26 DIAGNOSIS — E1140 Type 2 diabetes mellitus with diabetic neuropathy, unspecified: Secondary | ICD-10-CM | POA: Diagnosis not present

## 2016-04-26 DIAGNOSIS — M3214 Glomerular disease in systemic lupus erythematosus: Secondary | ICD-10-CM | POA: Diagnosis not present

## 2016-04-26 DIAGNOSIS — Z87891 Personal history of nicotine dependence: Secondary | ICD-10-CM | POA: Insufficient documentation

## 2016-04-26 DIAGNOSIS — M3213 Lung involvement in systemic lupus erythematosus: Secondary | ICD-10-CM | POA: Insufficient documentation

## 2016-04-26 DIAGNOSIS — I10 Essential (primary) hypertension: Secondary | ICD-10-CM | POA: Diagnosis not present

## 2016-04-26 DIAGNOSIS — Z923 Personal history of irradiation: Secondary | ICD-10-CM | POA: Diagnosis not present

## 2016-04-26 DIAGNOSIS — E119 Type 2 diabetes mellitus without complications: Secondary | ICD-10-CM | POA: Diagnosis not present

## 2016-04-26 DIAGNOSIS — Z79891 Long term (current) use of opiate analgesic: Secondary | ICD-10-CM | POA: Insufficient documentation

## 2016-04-26 HISTORY — DX: Cardiac arrhythmia, unspecified: I49.9

## 2016-04-26 HISTORY — DX: Feeding difficulties: R63.3

## 2016-04-26 HISTORY — DX: Other feeding difficulties: R63.39

## 2016-04-26 HISTORY — DX: Hypoxemia: R09.02

## 2016-04-26 HISTORY — DX: Anemia, unspecified: D64.9

## 2016-04-26 HISTORY — PX: CATARACT EXTRACTION W/PHACO: SHX586

## 2016-04-26 HISTORY — DX: Unspecified osteoarthritis, unspecified site: M19.90

## 2016-04-26 SURGERY — PHACOEMULSIFICATION, CATARACT, WITH IOL INSERTION
Anesthesia: Monitor Anesthesia Care | Site: Eye | Laterality: Left | Wound class: Clean

## 2016-04-26 MED ORDER — EPINEPHRINE HCL 1 MG/ML IJ SOLN
INTRAMUSCULAR | Status: DC | PRN
  Administered 2016-04-26: 1 mL via OPHTHALMIC

## 2016-04-26 MED ORDER — NA CHONDROIT SULF-NA HYALURON 40-17 MG/ML IO SOLN
INTRAOCULAR | Status: DC | PRN
  Administered 2016-04-26: 1 mL via INTRAOCULAR

## 2016-04-26 MED ORDER — EPINEPHRINE HCL 1 MG/ML IJ SOLN
INTRAMUSCULAR | Status: AC
  Filled 2016-04-26: qty 1

## 2016-04-26 MED ORDER — FENTANYL CITRATE (PF) 100 MCG/2ML IJ SOLN
INTRAMUSCULAR | Status: DC | PRN
  Administered 2016-04-26: 50 ug via INTRAVENOUS

## 2016-04-26 MED ORDER — CARBACHOL 0.01 % IO SOLN
INTRAOCULAR | Status: DC | PRN
  Administered 2016-04-26: .5 mL via INTRAOCULAR

## 2016-04-26 MED ORDER — MOXIFLOXACIN HCL 0.5 % OP SOLN
OPHTHALMIC | Status: AC
  Filled 2016-04-26: qty 3

## 2016-04-26 MED ORDER — ARMC OPHTHALMIC DILATING GEL
OPHTHALMIC | Status: DC
Start: 2016-04-26 — End: 2016-04-26
  Filled 2016-04-26: qty 0.25

## 2016-04-26 MED ORDER — TETRACAINE HCL 0.5 % OP SOLN
1.0000 [drp] | Freq: Once | OPHTHALMIC | Status: AC
  Administered 2016-04-26: 1 [drp] via OPHTHALMIC

## 2016-04-26 MED ORDER — CEFUROXIME OPHTHALMIC INJECTION 1 MG/0.1 ML
INJECTION | OPHTHALMIC | Status: AC
  Filled 2016-04-26: qty 0.1

## 2016-04-26 MED ORDER — POVIDONE-IODINE 5 % OP SOLN
1.0000 "application " | Freq: Once | OPHTHALMIC | Status: AC
  Administered 2016-04-26: 1 via OPHTHALMIC

## 2016-04-26 MED ORDER — POVIDONE-IODINE 5 % OP SOLN
OPHTHALMIC | Status: AC
  Filled 2016-04-26: qty 30

## 2016-04-26 MED ORDER — SODIUM CHLORIDE 0.9 % IV SOLN
INTRAVENOUS | Status: DC
  Administered 2016-04-26: 10:00:00 via INTRAVENOUS

## 2016-04-26 MED ORDER — NA CHONDROIT SULF-NA HYALURON 40-17 MG/ML IO SOLN
INTRAOCULAR | Status: AC
  Filled 2016-04-26: qty 1

## 2016-04-26 MED ORDER — MIDAZOLAM HCL 2 MG/2ML IJ SOLN
INTRAMUSCULAR | Status: DC | PRN
  Administered 2016-04-26: 1 mg via INTRAVENOUS

## 2016-04-26 MED ORDER — ARMC OPHTHALMIC DILATING GEL
1.0000 "application " | OPHTHALMIC | Status: AC | PRN
  Administered 2016-04-26 (×2): 1 via OPHTHALMIC

## 2016-04-26 MED ORDER — TETRACAINE HCL 0.5 % OP SOLN
OPHTHALMIC | Status: AC
  Filled 2016-04-26: qty 2

## 2016-04-26 MED ORDER — MOXIFLOXACIN HCL 0.5 % OP SOLN
OPHTHALMIC | Status: DC | PRN
  Administered 2016-04-26: 1 [drp] via OPHTHALMIC

## 2016-04-26 MED ORDER — MOXIFLOXACIN HCL 0.5 % OP SOLN: 1.0000 [drp] | OPHTHALMIC | Status: DC | PRN

## 2016-04-26 SURGICAL SUPPLY — 21 items
CANNULA ANT/CHMB 27GA (MISCELLANEOUS) ×3 IMPLANT
CUP MEDICINE 2OZ PLAST GRAD ST (MISCELLANEOUS) ×3 IMPLANT
GLOVE BIO SURGEON STRL SZ8 (GLOVE) ×3 IMPLANT
GLOVE BIOGEL M 6.5 STRL (GLOVE) ×3 IMPLANT
GLOVE SURG LX 8.0 MICRO (GLOVE) ×2
GLOVE SURG LX STRL 8.0 MICRO (GLOVE) ×1 IMPLANT
GOWN STRL REUS W/ TWL LRG LVL3 (GOWN DISPOSABLE) ×2 IMPLANT
GOWN STRL REUS W/TWL LRG LVL3 (GOWN DISPOSABLE) ×4
LENS IOL TECNIS ITEC 24.0 (Intraocular Lens) ×3 IMPLANT
PACK CATARACT (MISCELLANEOUS) ×3 IMPLANT
PACK CATARACT BRASINGTON LX (MISCELLANEOUS) ×3 IMPLANT
PACK EYE AFTER SURG (MISCELLANEOUS) ×3 IMPLANT
SOL BSS BAG (MISCELLANEOUS) ×3
SOL PREP PVP 2OZ (MISCELLANEOUS) ×3
SOLUTION BSS BAG (MISCELLANEOUS) ×1 IMPLANT
SOLUTION PREP PVP 2OZ (MISCELLANEOUS) ×1 IMPLANT
SYR 3ML LL SCALE MARK (SYRINGE) ×3 IMPLANT
SYR 5ML LL (SYRINGE) ×3 IMPLANT
SYR TB 1ML 27GX1/2 LL (SYRINGE) ×3 IMPLANT
WATER STERILE IRR 1000ML POUR (IV SOLUTION) ×3 IMPLANT
WIPE NON LINTING 3.25X3.25 (MISCELLANEOUS) ×3 IMPLANT

## 2016-04-26 NOTE — Discharge Instructions (Signed)
Eye Surgery Discharge Instructions  Expect mild scratchy sensation or mild soreness. DO NOT RUB YOUR EYE!  The day of surgery:  Minimal physical activity, but bed rest is not required  No reading, computer work, or close hand work  No bending, lifting, or straining.  May watch TV  For 24 hours:  No driving, legal decisions, or alcoholic beverages  Safety precautions  Eat anything you prefer: It is better to start with liquids, then soup then solid foods.  _____ Eye patch should be worn until postoperative exam tomorrow.  ____ Solar shield eyeglasses should be worn for comfort in the sunlight/patch while sleeping  Resume all regular medications including aspirin or Coumadin if these were discontinued prior to surgery. You may shower, bathe, shave, or wash your hair. Tylenol may be taken for mild discomfort.  Call your doctor if you experience significant pain, nausea, or vomiting, fever > 101 or other signs of infection. [phone] or [phone] Specific instructions:  Follow-up Information    PORFILIO,WILLIAM LOUIS, MD Follow up on 04/27/2016.   Specialty:  Ophthalmology Why:  10:10 Contact information: [address] [place] [address] [phone]

## 2016-04-26 NOTE — H&P (Signed)
  All labs reviewed. Abnormal studies sent to patients PCP when indicated.  Previous H&P reviewed, patient examined, there are NO CHANGES.  Diana [name] LOUIS8/8/201710:25 AM

## 2016-04-26 NOTE — Anesthesia Procedure Notes (Signed)
Procedure Name: MAC Performed by: Miaisabella Bacorn Pre-anesthesia Checklist: Patient identified, Emergency Drugs available, Suction available, Patient being monitored and Timeout performed Oxygen Delivery Method: Nasal cannula       

## 2016-04-26 NOTE — Anesthesia Postprocedure Evaluation (Signed)
Anesthesia Post Note  Patient: Diana [name]  Procedure(s) Performed: Procedure(s) (LRB): CATARACT EXTRACTION PHACO AND INTRAOCULAR LENS PLACEMENT (IOC) (Left)  Patient location during evaluation: PACU Anesthesia Type: MAC Level of consciousness: oriented and awake and alert Pain management: satisfactory to patient Vital Signs Assessment: post-procedure vital signs reviewed and stable Respiratory status: spontaneous breathing Cardiovascular status: blood pressure returned to baseline Anesthetic complications: no    Last Vitals:  Vitals:   04/26/16 0915  BP: 130/73  Pulse: 76  Resp: 16  Temp: 37.1 C    Last Pain:  Vitals:   04/26/16 0915  TempSrc: Oral  PainSc: 5                  Blima Singer

## 2016-04-26 NOTE — Anesthesia Preprocedure Evaluation (Signed)
Anesthesia Evaluation  Patient identified by MRN, date of birth, ID band Patient awake    Reviewed: Allergy & Precautions, NPO status , Patient's Chart, lab work & pertinent test results, reviewed documented beta blocker date and time   Airway Mallampati: II  TM Distance: >3 FB     Dental  (+) Chipped   Pulmonary shortness of breath, pneumonia, resolved, former smoker,           Cardiovascular hypertension, Pt. on medications and Pt. on home beta blockers +CHF  + dysrhythmias      Neuro/Psych  Neuromuscular disease    GI/Hepatic hiatal hernia, PUD, GERD  Controlled,  Endo/Other  diabetes, Type 2Hypothyroidism   Renal/GU Renal disease     Musculoskeletal  (+) Arthritis ,   Abdominal   Peds  Hematology  (+) anemia ,   Anesthesia Other Findings   Reproductive/Obstetrics                             Anesthesia Physical Anesthesia Plan  ASA: III  Anesthesia Plan: MAC   Post-op Pain Management:    Induction:   Airway Management Planned: Oral ETT  Additional Equipment:   Intra-op Plan:   Post-operative Plan:   Informed Consent: I have reviewed the patients History and Physical, chart, labs and discussed the procedure including the risks, benefits and alternatives for the proposed anesthesia with the patient or authorized representative who has indicated his/her understanding and acceptance.     Plan Discussed with: CRNA  Anesthesia Plan Comments:         Anesthesia Quick Evaluation

## 2016-04-26 NOTE — Op Note (Signed)
PREOPERATIVE DIAGNOSIS:  Nuclear sclerotic cataract of the left eye.   POSTOPERATIVE DIAGNOSIS:  nuclear sclerotic cataract left eye   OPERATIVE PROCEDURE:  Procedure(s): CATARACT EXTRACTION PHACO AND INTRAOCULAR LENS PLACEMENT (IOC)   SURGEON:  Birder Robson, MD.   ANESTHESIA:   Anesthesiologist: Gunnar Bulla, MD CRNA: Demetrius Charity, CRNA  1.      Managed anesthesia care. 2.      Topical tetracaine drops followed by 2% Xylocaine jelly applied in the preoperative holding area.   COMPLICATIONS:  None.   TECHNIQUE:   Stop and chop   DESCRIPTION OF PROCEDURE:  The patient was examined and consented in the preoperative holding area where the aforementioned topical anesthesia was applied to the left eye and then brought back to the Operating Room where the left eye was prepped and draped in the usual sterile ophthalmic fashion and a lid speculum was placed. A paracentesis was created with the side port blade and the anterior chamber was filled with viscoelastic. A near clear corneal incision was performed with the steel keratome. A continuous curvilinear capsulorrhexis was performed with a cystotome followed by the capsulorrhexis forceps. Hydrodissection and hydrodelineation were carried out with BSS on a blunt cannula. The lens was removed in a stop and chop  technique and the remaining cortical material was removed with the irrigation-aspiration handpiece. The capsular bag was inflated with viscoelastic and the Technis [id number] lens was placed in the capsular bag without complication. The remaining viscoelastic was removed from the eye with the irrigation-aspiration handpiece. The wounds were hydrated. The anterior chamber was flushed with Miostat and the eye was inflated to physiologic pressure. 0.1 mL of cefuroxime concentration 10 mg/mL was placed in the anterior chamber. The wounds were found to be water tight. The eye was dressed with Vigamox. The patient was given protective glasses to  wear throughout the day and a shield with which to sleep tonight. The patient was also given drops with which to begin a drop regimen today and will follow-up with me in one day.  Implant Name Type Inv. Item Serial No. Manufacturer Lot No. LRB No. Used  LENS IOL DIOP 24.0 - [id number] Intraocular Lens LENS IOL DIOP 24.0 [id number] AMO   Left 1   Procedure(s) with comments: CATARACT EXTRACTION PHACO AND INTRAOCULAR LENS PLACEMENT (IOC) (Left) -  [place] 00:59 AP% 23.2 CDE 13.83 Fluid pack lot # [id number]  Electronically signed: Loraine 04/26/2016 10:53 AM

## 2016-04-26 NOTE — Transfer of Care (Signed)
Immediate Anesthesia Transfer of Care Note  Patient: Diana [name]  Procedure(s) Performed: Procedure(s) with comments: CATARACT EXTRACTION PHACO AND INTRAOCULAR LENS PLACEMENT (IOC) (Left) -  [place] 00:59 AP% 23.2 CDE 13.83 Fluid pack lot # [id number]  Patient Location: PACU  Anesthesia Type:MAC  Level of Consciousness: awake, alert  and oriented  Airway & Oxygen Therapy: Patient Spontanous Breathing  Post-op Assessment: VSS  Post vital signs: Reviewed and stable  Last Vitals:  Vitals:   04/26/16 0915  BP: 130/73  Pulse: 76  Resp: 16  Temp: 37.1 C    Last Pain:  Vitals:   04/26/16 0915  TempSrc: Oral  PainSc: 5          Complications: No apparent anesthesia complications

## 2016-05-02 ENCOUNTER — Encounter: Payer: Self-pay | Admitting: Internal Medicine

## 2016-05-02 ENCOUNTER — Ambulatory Visit (INDEPENDENT_AMBULATORY_CARE_PROVIDER_SITE_OTHER): Payer: Medicare Other | Admitting: Internal Medicine

## 2016-05-02 VITALS — BP 132/79 | HR 82 | Temp 98.1°F | Wt 119.0 lb

## 2016-05-02 DIAGNOSIS — B258 Other cytomegaloviral diseases: Principal | ICD-10-CM

## 2016-05-02 DIAGNOSIS — K208 Other esophagitis without bleeding: Secondary | ICD-10-CM

## 2016-05-02 LAB — COMPLETE METABOLIC PANEL WITH GFR
ALBUMIN: 3.8 g/dL (ref 3.6–5.1)
ALK PHOS: 79 U/L (ref 33–130)
ALT: 21 U/L (ref 6–29)
AST: 27 U/L (ref 10–35)
BUN: 43 mg/dL — AB (ref 7–25)
CALCIUM: 9.3 mg/dL (ref 8.6–10.4)
CO2: 23 mmol/L (ref 20–31)
CREATININE: 1.23 mg/dL — AB (ref 0.60–0.93)
Chloride: 102 mmol/L (ref 98–110)
GFR, Est African American: 48 mL/min — ABNORMAL LOW (ref >=60)
GFR, Est Non African American: 42 mL/min — ABNORMAL LOW (ref >=60)
GLUCOSE: 118 mg/dL — AB (ref 65–99)
Potassium: 4.7 mmol/L (ref 3.5–5.3)
SODIUM: 138 mmol/L (ref 135–146)
TOTAL PROTEIN: 7 g/dL (ref 6.1–8.1)
Total Bilirubin: 0.3 mg/dL (ref 0.2–1.2)

## 2016-05-02 LAB — CBC WITH DIFFERENTIAL/PLATELET
BASOS PCT: 0 %
Basophils Absolute: 0 cells/uL (ref 0–200)
Eosinophils Absolute: 0 cells/uL — ABNORMAL LOW (ref 15–500)
Eosinophils Relative: 0 %
HEMATOCRIT: 35.6 % (ref 35.0–45.0)
Hemoglobin: 11.7 g/dL (ref 11.7–15.5)
LYMPHS ABS: 1001 {cells}/uL (ref 850–3900)
LYMPHS PCT: 11 %
MCH: 32.1 pg (ref 27.0–33.0)
MCHC: 32.9 g/dL (ref 32.0–36.0)
MCV: 97.5 fL (ref 80.0–100.0)
MONO ABS: 546 {cells}/uL (ref 200–950)
MPV: 9.7 fL (ref 7.5–12.5)
Monocytes Relative: 6 %
Neutro Abs: 7553 cells/uL (ref 1500–7800)
Neutrophils Relative %: 83 %
Platelets: 284 10*3/uL (ref 140–400)
RBC: 3.65 MIL/uL — AB (ref 3.80–5.10)
RDW: 14.8 % (ref 11.0–15.0)
WBC: 9.1 10*3/uL (ref 3.8–10.8)

## 2016-05-02 MED ORDER — VALGANCICLOVIR HCL 50 MG/ML PO SOLR: 900.0000 mg | Freq: Two times a day (BID) | ORAL | 1 refills | Status: DC

## 2016-05-02 NOTE — Progress Notes (Signed)
[facility]      Reason for Consult: CMV esophagitis    Referring Physician: Dr. Marva Panda    Patient ID: Diana [name], female    DOB: [birth year], 81 y.o.   MRN: [id number]  HPI:   She comes in for evaluation of possible CMV esophagitis.  In July 2016 she developed a gastric volvulus that did not improve with conservative management and underwent surgical management with adhesions and decompression of the stomach with partial reduction of massive hiatal hernia with incidental splenectomy.  She then developed significant issues with dysphagia and nause and upper endoscopy done noted esophagitis. She then required NGT feedings and thn PEG tube placement.  She has had I believe three different endoscopies noting esophagitis without improvement and the last one done in June of this year did note on pathology CMV viral cytopathic effect with IHC stain confirmation.   She also feels she has shingles, which she gets frequently.  She had been initially hesitant to come to the appt but finally did.  Has been on prednisone for many years.  Is hopeful to get rid of the PEG tube.   Previous record reviewed including hospitalizations, path reports.    Past Medical History:  Diagnosis Date  . Acute glomerulonephritis with other specified pathological lesion in kidney in disease classified elsewhere(580.81)   . Alveolar aeration decreased   . Anemia   . Arthritis   . Atrial fibrillation (HCC)   . Breast cancer (HCC) [id number]   positive, radiation  . Cancer (HCC)    Breast  . CHF (congestive heart failure) (HCC)   . Diaphragmatic hernia   . DVT (deep venous thrombosis) (HCC)   . Dysrhythmia   . Environmental allergies   . Esophageal reflux   . Feeding problem    FEEDING TUBE  . Hiatal hernia   . Hyperlipidemia   . Hypertension   . Lower extremity edema   . Lupus (systemic lupus erythematosus) (HCC)   . Neuropathy (HCC)   . Osteopenia   . Oxygen deficiency    USES HS  . Peripheral neuropathy, hereditary/idiopathic   . Pneumonia   . Primary pulmonary hypertension (HCC)   . Pulmonary hypertension (HCC)   . Renal insufficiency   . Sciatica   . Shingles   . Shingles (herpes zoster) polyneuropathy March 2013   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  . Shortness of breath dyspnea   . Unspecified hypothyroidism   . Unspecified menopausal and postmenopausal disorder   . Urinary incontinence without sensory awareness   . Vitamin B deficiency     Prior to Admission medications   Medication Sig Start Date End Date Taking? Authorizing Provider  acetaminophen (TYLENOL) 325 MG tablet Take 650 mg by mouth every 4 (four) hours as needed for mild pain or fever. Reported on 02/23/2016   Yes Historical Provider, MD  ALPRAZolam Prudy Feeler) 0.5 MG tablet Take 0.5 mg by mouth at bedtime as needed for anxiety. Reported on 04/05/2016   Yes Historical Provider, MD  Ascorbic Acid (VITAMIN C) 1000 MG tablet Take 1,000 mg by mouth daily.    Yes Historical Provider, MD  cholecalciferol (VITAMIN D) 400 UNITS TABS tablet Take 400 Units by mouth daily.    Yes Historical Provider, MD  cyanocobalamin (,VITAMIN B-12,) 1000 MCG/ML injection Inject 1 mL (1,000 mcg total) into the muscle every 30 (thirty) days. Pt uses on the 1st of every month. 09/29/15  Yes Sherlene Shams, MD  hydroxychloroquine (PLAQUENIL) 200  MG tablet Take 200 mg by mouth daily.   Yes Historical Provider, MD  letrozole (FEMARA) 2.5 MG tablet Take 1 tablet (2.5 mg total) by mouth daily. 02/04/16  Yes Loann Quill, NP  levocetirizine (XYZAL) 5 MG tablet Take 5 mg by mouth daily.   Yes Historical Provider, MD  levothyroxine (SYNTHROID) 175 MCG tablet Take 1 tablet (175 mcg total) by mouth daily before breakfast. 04/01/16  Yes Sherlene Shams, MD  loperamide (IMODIUM) 2 MG capsule Take 2-4 mg by mouth as needed for diarrhea or loose stools. Reported on 04/05/2016   Yes Historical Provider, MD  metoCLOPramide (REGLAN) 10 MG  tablet Take 1 tablet (10 mg total) by mouth 4 (four) times daily -  before meals and at bedtime. 09/29/15  Yes Sherlene Shams, MD  metoprolol succinate (TOPROL-XL) 25 MG 24 hr tablet Take 12.5 mg by mouth daily.    Yes Historical Provider, MD  mycophenolate (CELLCEPT) 500 MG tablet Take 1,000 mg by mouth 2 (two) times daily.   Yes Historical Provider, MD  Nutritional Supplements (FEEDING SUPPLEMENT, JEVITY 1.5 CAL,) LIQD Place 237 mLs into feeding tube continuous.   Yes Historical Provider, MD  ondansetron (ZOFRAN-ODT) 4 MG disintegrating tablet Take 4 mg by mouth every 4 (four) hours as needed for nausea or vomiting.   Yes Historical Provider, MD  oxyCODONE-acetaminophen (PERCOCET/ROXICET) 5-325 MG tablet Take 1 tablet by mouth every 8 (eight) hours as needed for severe pain. 03/28/16  Yes Sherlene Shams, MD  OXYGEN Inhale 2 mLs into the lungs at bedtime.   Yes Historical Provider, MD  pantoprazole sodium (PROTONIX) 40 mg/20 mL PACK Place 40 mg into feeding tube 2 (two) times daily.   Yes Historical Provider, MD  Polyvinyl Alcohol-Povidone (REFRESH OP) Place 2 drops into both eyes daily as needed (dry eyes).    Yes Historical Provider, MD  predniSONE (DELTASONE) 5 MG tablet Take 5 mg by mouth daily.    Yes Historical Provider, MD  sterile water for irrigation 237 mL Give 2.5 mL/hr by tube continuous.   Yes Historical Provider, MD  sucralfate (CARAFATE) 1 G tablet Take 1 tablet (1 g total) by mouth 4 (four) times daily -  with meals and at bedtime. 07/20/15  Yes Sital Mody, MD  Tadalafil, PAH, (ADCIRCA) 20 MG TABS Take 40 mg by mouth daily.   Yes Historical Provider, MD  Water For Irrigation, Sterile (FREE WATER) SOLN Place 150 mLs into feeding tube 2 (two) times daily. 06/12/15  Yes Enid Baas, MD  valganciclovir (VALCYTE) 50 MG/ML SOLR Place 18 mLs (900 mg total) into feeding tube 2 (two) times daily. 05/02/16   Gardiner Barefoot, MD    Allergies  Allergen Reactions  . Amoxicillin Other (See  Comments)    Reaction:  Stomach cramps   . Cefuroxime Itching  . Contrast Media [Iodinated Diagnostic Agents] Hives  . Metrizamide Hives  . Morphine And Related     Reaction: face flushed    . Propoxyphene     Stomach cramp  . Cephalexin Rash  . Sulfa Antibiotics Rash  . Sulfonamide Derivatives Rash  . Tramadol Hcl Rash  . Venlafaxine Rash    Social History  Substance Use Topics  . Smoking status: Former Smoker    Quit date: 05/25/1991  . Smokeless tobacco: Never Used  . Alcohol use Yes     Comment: rare    Family History  Problem Relation Age of Onset  . Colon cancer Mother   .  Alcohol abuse Sister   . Breast cancer Sister 70  . Cancer Brother   . Lung cancer Son     Review of Systems  Constitutional: negative for fevers and weight loss Respiratory: negative for cough Gastrointestinal: negative for diarrhea All other systems reviewed and are negative   Constitutional: in no apparent distress  Vitals:   05/02/16 1408  BP: 132/79  Pulse: 82  Temp: 98.1 F (36.7 C)   EYES: anicteric ENMT: Cardiovascular: Cor Tachy Respiratory: CTA B; normal respiratory effort GI: Bowel sounds are normal, liver is not enlarged, spleen is not enlarged Musculoskeletal: no pedal edema noted Skin: negatives: no rash Hematologic: no cervical or supraclavicular lad  Labs: Lab Results  Component Value Date   WBC 11.7 (H) 02/04/2016   HGB 12.0 02/04/2016   HCT 36.6 02/04/2016   MCV 97.0 02/04/2016   PLT 307 02/04/2016    Lab Results  Component Value Date   CREATININE 1.05 (H) 02/04/2016   BUN 37 (H) 02/04/2016   NA 138 02/04/2016   K 3.9 02/04/2016   CL 102 02/04/2016   CO2 28 02/04/2016    Lab Results  Component Value Date   ALT 24 02/04/2016   AST 31 02/04/2016   ALKPHOS 96 02/04/2016   BILITOT 0.5 02/04/2016   INR 1.22 06/10/2015     Assessment: CMV esophagitis in an immunosuppressed patient.  She has a long history of prednisone use and is at risk for CMV  disease and this is certainly a true pathogen, particularly with persistent disease.  I do think it is worth treating with oral valganciclovir via PEG tube for 21 days and see if there is any improvement.  Since she is otherwise relatively asymptomatic, verification of improvement would be via repeat endoscopy.  I discussed with her that if there is no or little improvement, CMV may not be the pathogen.  A CMV DNA in the blood may also help with response to treatment, if it is positive.    Plan: 1) CMV DNA, HIV today 2) start valganciclovir 900 mg twice a day for 21 days 3) discuss with Dr. Marva Panda timing of repeat endoscopy 4) weekly CBC with diff while on valganciclovir 5) valganciclovir also for potential shingles 6) rtc 3 weeks

## 2016-05-03 ENCOUNTER — Ambulatory Visit (INDEPENDENT_AMBULATORY_CARE_PROVIDER_SITE_OTHER): Payer: Medicare Other | Admitting: Internal Medicine

## 2016-05-03 VITALS — BP 122/78 | HR 78 | Temp 98.8°F | Resp 14 | Wt 118.0 lb

## 2016-05-03 DIAGNOSIS — M47817 Spondylosis without myelopathy or radiculopathy, lumbosacral region: Secondary | ICD-10-CM | POA: Diagnosis not present

## 2016-05-03 DIAGNOSIS — E038 Other specified hypothyroidism: Secondary | ICD-10-CM

## 2016-05-03 DIAGNOSIS — E119 Type 2 diabetes mellitus without complications: Secondary | ICD-10-CM

## 2016-05-03 DIAGNOSIS — E1169 Type 2 diabetes mellitus with other specified complication: Secondary | ICD-10-CM | POA: Diagnosis not present

## 2016-05-03 DIAGNOSIS — E034 Atrophy of thyroid (acquired): Secondary | ICD-10-CM

## 2016-05-03 DIAGNOSIS — D509 Iron deficiency anemia, unspecified: Secondary | ICD-10-CM | POA: Diagnosis not present

## 2016-05-03 DIAGNOSIS — D518 Other vitamin B12 deficiency anemias: Secondary | ICD-10-CM

## 2016-05-03 DIAGNOSIS — I272 Other secondary pulmonary hypertension: Secondary | ICD-10-CM

## 2016-05-03 DIAGNOSIS — K449 Diaphragmatic hernia without obstruction or gangrene: Secondary | ICD-10-CM

## 2016-05-03 DIAGNOSIS — E785 Hyperlipidemia, unspecified: Secondary | ICD-10-CM

## 2016-05-03 DIAGNOSIS — I2729 Other secondary pulmonary hypertension: Secondary | ICD-10-CM

## 2016-05-03 NOTE — Patient Instructions (Addendum)
Start taking the thyroid medication at around 12:00 to 12:30 .  This way your stomach is empty and it has been 2 hours since the medications were taken  We will repeat your TSH on or around the end of September   Return for a CBC lab  Next Tuesday August 21 and  Then once weekly while on th CMV medication    Your wound looks fine.  It Is not infected.  You can stop using the antibiotic ointment and just use a little Surgilube when you change the dressing  . Avoid getting it wet in the shower or bath,  If you do flush it with sterile water or sterile saline solution

## 2016-05-03 NOTE — Progress Notes (Signed)
Subjective:  Patient ID: Diana [name], female    DOB: Dec 27, 1934  Age: 81 y.o. MRN: [id number]  CC: The primary encounter diagnosis was Hyperlipidemia associated with type 2 diabetes mellitus (HCC). Diagnoses of ANEMIA, B12 DEFICIENCY, Anemia, iron deficiency, Facet arthritis of lumbosacral region, Diabetes mellitus without complication (HCC), Diaphragmatic hernia without obstruction and without gangrene, Hypertensive pulmonary venous disease (HCC), and Hypothyroidism due to acquired atrophy of thyroid were also pertinent to this visit.  HPI Diana [name] presents for follow up on multiple chronic issues  1) sciatica: secondary to facet arthritis and spinal stenosis  Improved but not resolved after the 3rd  managed with percocet, not a candidate for surgery or NSAIDs due to poor health and GI issues.    2) Hypothyroidism: dose was increased on July 12th. She was told by staff that she could take her meds at bedtime,.  But did not clarify that she  Has nocturnal tube feeds which occur from 8:30 pm to 9 am so her thyroid medication has likely not been getting absorbed.     3) Has a Superficial Cut/skin tear  on her lright lateral calf from a cardboard box,  Has been keeping it covered and Using topical antibiotic ointment.   4) persistent esophagitis.  Significant weight loss and malnutrition now resolved with continued nocturnal nutritional support via PEG tube (Jevity, 3.5 cans /night) .   CMV and hiatal hernia suspected as major contributors given failure to resolve with treatment of candida and reflux. And chronic  I/c status.  Was referred to Staci Righter,   I D and had her meeting yesterday.  Advised to try oral valganciclovir via PEG x 21 days followed by repeat EGD.   Med is going to cost $350 out of pocket so she has not started it yet   5) She has finally consented to see  a surgeon at [facility] for consideration of diaphragmatic  hernia repair  Dr Jacques Navy in September    Outpatient  Medications Prior to Visit  Medication Sig Dispense Refill  . acetaminophen (TYLENOL) 325 MG tablet Take 650 mg by mouth every 4 (four) hours as needed for mild pain or fever. Reported on 02/23/2016    . ALPRAZolam (XANAX) 0.5 MG tablet Take 0.5 mg by mouth at bedtime as needed for anxiety. Reported on 04/05/2016    . Ascorbic Acid (VITAMIN C) 1000 MG tablet Take 1,000 mg by mouth daily.     . cholecalciferol (VITAMIN D) 400 UNITS TABS tablet Take 400 Units by mouth daily.     . cyanocobalamin (,VITAMIN B-12,) 1000 MCG/ML injection Inject 1 mL (1,000 mcg total) into the muscle every 30 (thirty) days. Pt uses on the 1st of every month. 10 mL 1  . hydroxychloroquine (PLAQUENIL) 200 MG tablet Take 200 mg by mouth daily.    Marland Kitchen letrozole (FEMARA) 2.5 MG tablet Take 1 tablet (2.5 mg total) by mouth daily. 90 tablet 3  . levocetirizine (XYZAL) 5 MG tablet Take 5 mg by mouth daily.    Marland Kitchen levothyroxine (SYNTHROID) 175 MCG tablet Take 1 tablet (175 mcg total) by mouth daily before breakfast. 90 tablet 1  . loperamide (IMODIUM) 2 MG capsule Take 2-4 mg by mouth as needed for diarrhea or loose stools. Reported on 04/05/2016    . metoCLOPramide (REGLAN) 10 MG tablet Take 1 tablet (10 mg total) by mouth 4 (four) times daily -  before meals and at bedtime. 120 tablet 5  . metoprolol succinate (TOPROL-XL) 25  MG 24 hr tablet Take 12.5 mg by mouth daily.     . mycophenolate (CELLCEPT) 500 MG tablet Take 1,000 mg by mouth 2 (two) times daily.    . Nutritional Supplements (FEEDING SUPPLEMENT, JEVITY 1.5 CAL,) LIQD Place 237 mLs into feeding tube continuous.    . ondansetron (ZOFRAN-ODT) 4 MG disintegrating tablet Take 4 mg by mouth every 4 (four) hours as needed for nausea or vomiting.    Marland Kitchen oxyCODONE-acetaminophen (PERCOCET/ROXICET) 5-325 MG tablet Take 1 tablet by mouth every 8 (eight) hours as needed for severe pain. 90 tablet 0  . OXYGEN Inhale 2 mLs into the lungs at bedtime.    . pantoprazole sodium (PROTONIX) 40  mg/20 mL PACK Place 40 mg into feeding tube 2 (two) times daily.    . Polyvinyl Alcohol-Povidone (REFRESH OP) Place 2 drops into both eyes daily as needed (dry eyes).     . predniSONE (DELTASONE) 5 MG tablet Take 5 mg by mouth daily.     Marland Kitchen sterile water for irrigation 237 mL Give 2.5 mL/hr by tube continuous.    . sucralfate (CARAFATE) 1 G tablet Take 1 tablet (1 g total) by mouth 4 (four) times daily -  with meals and at bedtime. 120 tablet 0  . Tadalafil, PAH, (ADCIRCA) 20 MG TABS Take 40 mg by mouth daily.    . valganciclovir (VALCYTE) 50 MG/ML SOLR Place 18 mLs (900 mg total) into feeding tube 2 (two) times daily. 800 mL 1  . Water For Irrigation, Sterile (FREE WATER) SOLN Place 150 mLs into feeding tube 2 (two) times daily. 1500 mL 2   No facility-administered medications prior to visit.     Review of Systems;  Patient denies headache, fevers, malaise, unintentional weight loss, skin rash, eye pain, sinus congestion and sinus pain, sore throat, dysphagia,  hemoptysis , cough, dyspnea, wheezing, chest pain, palpitations, orthopnea, edema, abdominal pain, nausea, melena, diarrhea, constipation, flank pain, dysuria, hematuria, urinary  Frequency, nocturia, numbness, tingling, seizures,  Focal weakness, Loss of consciousness,  Tremor, insomnia, depression, anxiety, and suicidal ideation.      Objective:  BP 122/78   Pulse 78   Temp 98.8 F (37.1 C)   Resp 14   Wt 118 lb (53.5 kg)   BMI 20.90 kg/m   BP Readings from Last 3 Encounters:  05/03/16 122/78  05/02/16 132/79  04/26/16 131/72    Wt Readings from Last 3 Encounters:  05/03/16 118 lb (53.5 kg)  05/02/16 119 lb (54 kg)  04/26/16 117 lb (53.1 kg)    General appearance: alert, cooperative and appears stated age Ears: normal TM's and external ear canals both ears Throat: lips, mucosa, and tongue normal; teeth and gums normal Neck: no adenopathy, no carotid bruit, supple, symmetrical, trachea midline and thyroid not  enlarged, symmetric, no tenderness/mass/nodules Back: symmetric, no curvature. ROM normal. No CVA tenderness. Lungs: clear to auscultation bilaterally Heart: regular rate and rhythm, S1, S2 normal, no murmur, click, rub or gallop Abdomen: soft, non-tender; bowel sounds normal; no masses,  no organomegaly Pulses: 2+ and symmetric Skin: Skin color, texture, turgor normal. No rashes or lesions Lymph nodes: Cervical, supraclavicular, and axillary nodes normal.  Lab Results  Component Value Date   HGBA1C 5.9 07/03/2015   HGBA1C 7.1 (H) 11/19/2014   HGBA1C 6.9 (H) 04/22/2014    Lab Results  Component Value Date   CREATININE 1.23 (H) 05/02/2016   CREATININE 1.05 (H) 02/04/2016   CREATININE 0.89 02/01/2016    Lab Results  Component  Value Date   WBC 9.1 05/02/2016   HGB 11.7 05/02/2016   HCT 35.6 05/02/2016   PLT 284 05/02/2016   GLUCOSE 118 (H) 05/02/2016   CHOL 164 10/11/2013   TRIG 152.0 (H) 10/11/2013   HDL 69.70 10/11/2013   LDLDIRECT 28.0 07/03/2015   LDLCALC 64 10/11/2013   ALT 21 05/02/2016   AST 27 05/02/2016   NA 138 05/02/2016   K 4.7 05/02/2016   CL 102 05/02/2016   CREATININE 1.23 (H) 05/02/2016   BUN 43 (H) 05/02/2016   CO2 23 05/02/2016   TSH 5.01 (H) 03/30/2016   INR 1.22 06/10/2015   HGBA1C 5.9 07/03/2015   MICROALBUR <0.7 11/28/2014    No results found.  Assessment & Plan:   Problem List Items Addressed This Visit    Hypothyroidism    Recent loss of therapeutic range TSH may be due to timing of administration during tube feeds,.  Advised to take medication  2 hours after her tube feed has finished,  And repeat TSH in 6 weeks       Relevant Orders   T4 AND TSH   ANEMIA, B12 DEFICIENCY    Managed with monthly IM supplementation  Lab Results  Component Value Date   VITAMINB12 390 09/29/2015   Lab Results  Component Value Date   WBC 9.1 05/02/2016   HGB 11.7 05/02/2016   HCT 35.6 05/02/2016   MCV 97.5 05/02/2016   PLT 284 05/02/2016          Diabetes mellitus without complication (HCC)      Her DM has been historically diet controlled, follow up labs are needed .  Lab Results  Component Value Date   HGBA1C 5.9 07/03/2015   Lab Results  Component Value Date   MICROALBUR <0.7 11/28/2014        Relevant Orders   Hemoglobin A1c   Microalbumin / creatinine urine ratio   Comprehensive metabolic panel   Hypertensive pulmonary venous disease (HCC)    Continue use of supplemental oxygen at night       Anemia, iron deficiency    Secondary to recurrent GI bleeds.  Resolved with iv iron infusions.   CBC Latest Ref Rng & Units 05/02/2016 02/04/2016 02/01/2016  WBC 3.8 - 10.8 K/uL 9.1 11.7(H) 8.8  Hemoglobin 11.7 - 15.5 g/dL 14.7 82.9 11.7(L)  Hematocrit 35.0 - 45.0 % 35.6 36.6 36.4  Platelets 140 - 400 K/uL 284 307 330.0           Diaphragmatic hernia    She has finally consented to consider surgical correction of her large hernia due to persistnet esophagitis and restrictive pulmonary status.       Facet arthritis of lumbosacral region    managed by [facility] with percocet        Other Visit Diagnoses    Hyperlipidemia associated with type 2 diabetes mellitus (HCC)    -  Primary   Relevant Orders   LDL cholesterol, direct     A total of 25 minutes of face to face time was spent with patient more than half of which was spent in counselling about the above mentioned conditions  and coordination of care  I am having Ms. Takeda maintain her hydroxychloroquine, tadalafil (PAH), acetaminophen, loperamide, levocetirizine, predniSONE, pantoprazole sodium, free water, sucralfate, cholecalciferol, metoprolol succinate, metoCLOPramide, cyanocobalamin, mycophenolate, letrozole, Polyvinyl Alcohol-Povidone (REFRESH OP), vitamin C, OXYGEN, oxyCODONE-acetaminophen, ondansetron, ALPRAZolam, levothyroxine, sterile water for irrigation 237 mL, feeding supplement (JEVITY 1.5 CAL), and valganciclovir.  No  orders of the defined  types were placed in this encounter.   There are no discontinued medications.  Follow-up: Return in about 1 week (around 05/10/2016), or labs only , for lab visit 1 week CBC .   Sherlene Shams, MD

## 2016-05-03 NOTE — Progress Notes (Signed)
Pre visit review using our clinic review tool, if applicable. No additional management support is needed unless otherwise documented below in the visit note. 

## 2016-05-05 LAB — CMV (CYTOMEGALOVIRUS) DNA ULTRAQUANT, PCR
CMV DNA, QN PCR: 2.85 log IU/mL — ABNORMAL HIGH (ref <2.30)
CMV DNA, QN Real Time PCR: 707 IU/mL — ABNORMAL HIGH (ref <200)

## 2016-05-05 NOTE — Assessment & Plan Note (Addendum)
Secondary to recurrent GI bleeds.  Resolved with iv iron infusions.   CBC Latest Ref Rng & Units 05/02/2016 02/04/2016 02/01/2016  WBC 3.8 - 10.8 K/uL 9.1 11.7(H) 8.8  Hemoglobin 11.7 - 15.5 g/dL 11.7 12.0 11.7(L)  Hematocrit 35.0 - 45.0 % 35.6 36.6 36.4  Platelets 140 - 400 K/uL 284 307 330.0

## 2016-05-05 NOTE — Assessment & Plan Note (Signed)
Managed with monthly IM supplementation  Lab Results  Component Value Date   VITAMINB12 390 09/29/2015   Lab Results  Component Value Date   WBC 9.1 05/02/2016   HGB 11.7 05/02/2016   HCT 35.6 05/02/2016   MCV 97.5 05/02/2016   PLT 284 05/02/2016

## 2016-05-05 NOTE — Assessment & Plan Note (Signed)
managed by [facility] with percocet

## 2016-05-05 NOTE — Assessment & Plan Note (Signed)
Continue use of supplemental oxygen at night

## 2016-05-05 NOTE — Assessment & Plan Note (Signed)
She has finally consented to consider surgical correction of her large hernia due to persistnet esophagitis and restrictive pulmonary status.

## 2016-05-05 NOTE — Assessment & Plan Note (Signed)
Her DM has been historically diet controlled, follow up labs are needed .  Lab Results  Component Value Date   HGBA1C 5.9 07/03/2015   Lab Results  Component Value Date   MICROALBUR <0.7 11/28/2014

## 2016-05-05 NOTE — Assessment & Plan Note (Signed)
Recent loss of therapeutic range TSH may be due to timing of administration during tube feeds,.  Advised to take medication  2 hours after her tube feed has finished,  And repeat TSH in 6 weeks

## 2016-05-09 ENCOUNTER — Other Ambulatory Visit (INDEPENDENT_AMBULATORY_CARE_PROVIDER_SITE_OTHER): Payer: Medicare Other

## 2016-05-09 DIAGNOSIS — E119 Type 2 diabetes mellitus without complications: Secondary | ICD-10-CM

## 2016-05-09 DIAGNOSIS — E038 Other specified hypothyroidism: Secondary | ICD-10-CM

## 2016-05-09 DIAGNOSIS — E1169 Type 2 diabetes mellitus with other specified complication: Secondary | ICD-10-CM | POA: Diagnosis not present

## 2016-05-09 DIAGNOSIS — E034 Atrophy of thyroid (acquired): Secondary | ICD-10-CM

## 2016-05-09 DIAGNOSIS — E785 Hyperlipidemia, unspecified: Secondary | ICD-10-CM

## 2016-05-09 LAB — COMPREHENSIVE METABOLIC PANEL
ALBUMIN: 3.9 g/dL (ref 3.5–5.2)
ALT: 17 U/L (ref 0–35)
AST: 23 U/L (ref 0–37)
Alkaline Phosphatase: 77 U/L (ref 39–117)
BUN: 36 mg/dL — AB (ref 6–23)
CHLORIDE: 99 meq/L (ref 96–112)
CO2: 31 mEq/L (ref 19–32)
CREATININE: 1.01 mg/dL (ref 0.40–1.20)
Calcium: 9.9 mg/dL (ref 8.4–10.5)
GFR: 56.14 mL/min — ABNORMAL LOW (ref >60.00)
Glucose, Bld: 136 mg/dL — ABNORMAL HIGH (ref 70–99)
Potassium: 4.6 mEq/L (ref 3.5–5.1)
SODIUM: 141 meq/L (ref 135–145)
Total Bilirubin: 0.2 mg/dL (ref 0.2–1.2)
Total Protein: 7.3 g/dL (ref 6.0–8.3)

## 2016-05-09 LAB — LDL CHOLESTEROL, DIRECT: LDL DIRECT: 50 mg/dL

## 2016-05-09 LAB — HEMOGLOBIN A1C: Hgb A1c MFr Bld: 6.2 % (ref 4.6–6.5)

## 2016-05-10 ENCOUNTER — Other Ambulatory Visit: Payer: Medicare Other

## 2016-05-10 DIAGNOSIS — R112 Nausea with vomiting, unspecified: Secondary | ICD-10-CM | POA: Diagnosis not present

## 2016-05-10 DIAGNOSIS — K449 Diaphragmatic hernia without obstruction or gangrene: Secondary | ICD-10-CM | POA: Diagnosis not present

## 2016-05-10 DIAGNOSIS — R1314 Dysphagia, pharyngoesophageal phase: Secondary | ICD-10-CM | POA: Diagnosis not present

## 2016-05-10 DIAGNOSIS — R0789 Other chest pain: Secondary | ICD-10-CM | POA: Diagnosis not present

## 2016-05-10 LAB — T4 AND TSH
T4, Total: 10.7 ug/dL (ref 4.5–12.0)
TSH: 0.836 u[IU]/mL (ref 0.450–4.500)

## 2016-05-11 ENCOUNTER — Encounter: Payer: Self-pay | Admitting: *Deleted

## 2016-05-12 ENCOUNTER — Encounter: Payer: Self-pay | Admitting: *Deleted

## 2016-05-12 ENCOUNTER — Other Ambulatory Visit: Payer: Self-pay | Admitting: *Deleted

## 2016-05-12 NOTE — Patient Outreach (Signed)
[facility]) Care Management  05/12/2016   Diana [name] 1935/03/24 [id number]  Subjective: RN Health Coach telephone call to patient.  Hipaa compliance verified. Per patient she is doing very well. She is not having any swelling in her lower extremities . She is not having shortness of breath with minimal activity. She is weighing daily and documenting.  She is in the green zone. The patient is looking forward to going to [facility] September 5th for evaluation of her reflux.  Patient diabetes is under control with A1C 6.2 . Her LDL is 50.0. Patient is still on Jevity feeding.Patient has met her goals of understanding Congestive Heart Failure.    Objective:   Current Medications:  Current Outpatient Prescriptions  Medication Sig Dispense Refill  . acetaminophen (TYLENOL) 325 MG tablet Take 650 mg by mouth every 4 (four) hours as needed for mild pain or fever. Reported on 02/23/2016    . ALPRAZolam (XANAX) 0.5 MG tablet Take 0.5 mg by mouth at bedtime as needed for anxiety. Reported on 04/05/2016    . Ascorbic Acid (VITAMIN C) 1000 MG tablet Take 1,000 mg by mouth daily.     . cholecalciferol (VITAMIN D) 400 UNITS TABS tablet Take 400 Units by mouth daily.     . cyanocobalamin (,VITAMIN B-12,) 1000 MCG/ML injection Inject 1 mL (1,000 mcg total) into the muscle every 30 (thirty) days. Pt uses on the 1st of every month. 10 mL 1  . hydroxychloroquine (PLAQUENIL) 200 MG tablet Take 200 mg by mouth daily.    Marland Kitchen letrozole (FEMARA) 2.5 MG tablet Take 1 tablet (2.5 mg total) by mouth daily. 90 tablet 3  . levocetirizine (XYZAL) 5 MG tablet Take 5 mg by mouth daily.    Marland Kitchen levothyroxine (SYNTHROID) 175 MCG tablet Take 1 tablet (175 mcg total) by mouth daily before breakfast. 90 tablet 1  . loperamide (IMODIUM) 2 MG capsule Take 2-4 mg by mouth as needed for diarrhea or loose stools. Reported on 04/05/2016    . metoCLOPramide (REGLAN) 10 MG tablet Take 1 tablet (10 mg total) by mouth 4 (four)  times daily -  before meals and at bedtime. 120 tablet 5  . metoprolol succinate (TOPROL-XL) 25 MG 24 hr tablet Take 12.5 mg by mouth daily.     . mycophenolate (CELLCEPT) 500 MG tablet Take 1,000 mg by mouth 2 (two) times daily.    . Nutritional Supplements (FEEDING SUPPLEMENT, JEVITY 1.5 CAL,) LIQD Place 237 mLs into feeding tube continuous.    . ondansetron (ZOFRAN-ODT) 4 MG disintegrating tablet Take 4 mg by mouth every 4 (four) hours as needed for nausea or vomiting.    Marland Kitchen oxyCODONE-acetaminophen (PERCOCET/ROXICET) 5-325 MG tablet Take 1 tablet by mouth every 8 (eight) hours as needed for severe pain. 90 tablet 0  . OXYGEN Inhale 2 mLs into the lungs at bedtime.    . pantoprazole sodium (PROTONIX) 40 mg/20 mL PACK Place 40 mg into feeding tube 2 (two) times daily.    . Polyvinyl Alcohol-Povidone (REFRESH OP) Place 2 drops into both eyes daily as needed (dry eyes).     . predniSONE (DELTASONE) 5 MG tablet Take 5 mg by mouth daily.     Marland Kitchen sterile water for irrigation 237 mL Give 2.5 mL/hr by tube continuous.    . sucralfate (CARAFATE) 1 G tablet Take 1 tablet (1 g total) by mouth 4 (four) times daily -  with meals and at bedtime. 120 tablet 0  . Tadalafil, PAH, (ADCIRCA) 20  MG TABS Take 40 mg by mouth daily.    . valganciclovir (VALCYTE) 50 MG/ML SOLR Place 18 mLs (900 mg total) into feeding tube 2 (two) times daily. 800 mL 1  . Water For Irrigation, Sterile (FREE WATER) SOLN Place 150 mLs into feeding tube 2 (two) times daily. 1500 mL 2   No current facility-administered medications for this visit.     Functional Status:  In your present state of health, do you have any difficulty performing the following activities: 05/12/2016 04/12/2016  Hearing? N N  Vision? N Y  Difficulty concentrating or making decisions? N N  Walking or climbing stairs? Y Y  Dressing or bathing? N N  Doing errands, shopping? [name]  Preparing Food and eating ? N N  Using the Toilet? N N  In the past six months, have  you accidently leaked urine? N N  Do you have problems with loss of bowel control? N N  Managing your Medications? N N  Managing your Finances? N N  Housekeeping or managing your Housekeeping? [name]  Some recent data might be hidden    Fall/Depression Screening: PHQ 2/9 Scores 05/12/2016 04/12/2016 03/15/2016 02/24/2016 02/02/2016 01/25/2016 01/06/2016  PHQ - 2 Score 0 0 0 0 0 - 0  Exception Documentation - - - - - Patient refusal -   [facility] CM Care Plan Problem One   Flowsheet Row Most Recent Value  Care Plan Problem One  Knowledge deficit in self management of congestive heart falure  Role Documenting the Problem One  Health Coach  Care Plan for Problem One  Active  [facility] Long Term Goal (31-90 days)  Patient will not have any admissions for CHF within the next 90 days  [facility] Long Term Goal Start Date  05/12/16  Interventions for Problem One Long Term Goal  RN reminded patient to keep appointments with PCP and other physician that patient is seeing. Patient reminded the importance of taking medications as per order. RN Health Coach will monitor patient monthly telephonic.Patient is also following up with wellness checks  [facility] CM Short Term Goal #1 (0-30 days)  Patient will be able to verbalize 3 signs ans symptoms of CHF within the next 30 days  [facility] CM Short Term Goal #1 Start Date  05/12/16  [facility] CM Short Term Goal #1 Met Date  05/12/16  Interventions for Short Term Goal #1  RN sent educational material on Congestive heart failure zones. RN will send a large magnet for CHF to place on refrigerator. RN will follow up with discussion and teachback.      Assessment:  Patient has met her goal for CHF Patient is weighing daily and recording   Plan:  RN will send patient a book on living better with heart failure RN will follow up in the month of October to see if any additional needs and then close if none noted.  Gean Maidens BSN RN [facility] Care Management [phone]

## 2016-05-16 ENCOUNTER — Telehealth: Payer: Self-pay | Admitting: Internal Medicine

## 2016-05-16 ENCOUNTER — Other Ambulatory Visit (INDEPENDENT_AMBULATORY_CARE_PROVIDER_SITE_OTHER): Payer: Medicare Other

## 2016-05-16 DIAGNOSIS — C50412 Malignant neoplasm of upper-outer quadrant of left female breast: Secondary | ICD-10-CM

## 2016-05-16 LAB — CBC WITH DIFFERENTIAL/PLATELET
Basophils Absolute: 0.1 10*3/uL (ref 0.0–0.1)
Basophils Relative: 0.9 % (ref 0.0–3.0)
EOS ABS: 0 10*3/uL (ref 0.0–0.7)
Eosinophils Relative: 0.5 % (ref 0.0–5.0)
HCT: 36.3 % (ref 36.0–46.0)
HEMOGLOBIN: 11.8 g/dL — AB (ref 12.0–15.0)
Lymphocytes Relative: 23.1 % (ref 12.0–46.0)
Lymphs Abs: 1.5 10*3/uL (ref 0.7–4.0)
MCHC: 32.6 g/dL (ref 30.0–36.0)
MCV: 100.3 fl — ABNORMAL HIGH (ref 78.0–100.0)
MONO ABS: 0 10*3/uL — AB (ref 0.1–1.0)
Monocytes Relative: 0.7 % — ABNORMAL LOW (ref 3.0–12.0)
NEUTROS PCT: 74.8 % (ref 43.0–77.0)
Neutro Abs: 4.9 10*3/uL (ref 1.4–7.7)
Platelets: 287 10*3/uL (ref 150.0–400.0)
RBC: 3.62 Mil/uL — AB (ref 3.87–5.11)
RDW: 15.4 % (ref 11.5–15.5)
WBC: 6.6 10*3/uL (ref 4.0–10.5)

## 2016-05-16 NOTE — Progress Notes (Signed)
I will be doing a repeat scope in a couple weeks post end of treatment.

## 2016-05-24 ENCOUNTER — Telehealth: Payer: Self-pay

## 2016-05-24 DIAGNOSIS — K449 Diaphragmatic hernia without obstruction or gangrene: Secondary | ICD-10-CM | POA: Diagnosis not present

## 2016-05-24 NOTE — Telephone Encounter (Signed)
Pt is on lab schedule for 05/25/16. It says for a CBC in note section. Pt had a CBC done on 05/16/16. Dr. Derrel Nip did not state she needed a repeat CBC. Please advise.

## 2016-05-25 ENCOUNTER — Encounter: Payer: Self-pay | Admitting: Internal Medicine

## 2016-05-25 ENCOUNTER — Ambulatory Visit (INDEPENDENT_AMBULATORY_CARE_PROVIDER_SITE_OTHER): Payer: Medicare Other | Admitting: Internal Medicine

## 2016-05-25 ENCOUNTER — Other Ambulatory Visit: Payer: Medicare Other

## 2016-05-25 DIAGNOSIS — K208 Other esophagitis without bleeding: Secondary | ICD-10-CM

## 2016-05-25 DIAGNOSIS — K3189 Other diseases of stomach and duodenum: Secondary | ICD-10-CM | POA: Diagnosis not present

## 2016-05-25 DIAGNOSIS — B258 Other cytomegaloviral diseases: Principal | ICD-10-CM

## 2016-05-25 NOTE — Assessment & Plan Note (Addendum)
Now treated for 3 weeks and CMV level was 700 in the blood.  No indication to trend levels.  Dr. Gustavo Lah is going to do a repeat endoscopy in a couple of weeks to see if there was any benefit.  Patient hopeful.   Will follow up PRN

## 2016-05-25 NOTE — Assessment & Plan Note (Signed)
Starting to eat now.

## 2016-05-25 NOTE — Progress Notes (Signed)
[facility]      Follow up for possible CMV esophagitis.     Patient ID: Diana [name], female    DOB: [birth year], 81 y.o.   MRN: [id number]  HPI:   She comes in for evaluation of possible CMV esophagitis.  In July 2016 she developed a gastric volvulus that did not improve with conservative management and underwent surgical management with adhesions and decompression of the stomach with partial reduction of massive hiatal hernia with incidental splenectomy.  She then developed significant issues with dysphagia and nause and upper endoscopy done noted esophagitis. She then required NGT feedings and thn PEG tube placement.  She has had I believe three different endoscopies noting esophagitis without improvement and the last one done in June of this year did note on pathology CMV viral cytopathic effect with IHC stain confirmation.   She also feels she has shingles, which she gets frequently.  She had been initially hesitant to come to the appt but finally did.  Has been on prednisone for many years.  Is hopeful to get rid of the PEG tube.   Previous record reviewed including hospitalizations, path reports.    Work up last visit did reveal positive low level CMV viremia.    She comes in today for follow up after finishing 3 weeks of oral valgancyclovir.  Labs on 8/28 ok with normal wbc, normal creat.    Past Medical History:  Diagnosis Date  . Acute glomerulonephritis with other specified pathological lesion in kidney in disease classified elsewhere(580.81)   . Alveolar aeration decreased   . Anemia   . Arthritis   . Atrial fibrillation (HCC)   . Breast cancer (HCC) 06/03/2014   positive, radiation  . Cancer (HCC)    Breast  . CHF (congestive heart failure) (HCC)   . Diaphragmatic hernia   . DVT (deep venous thrombosis) (HCC)   . Dysrhythmia   . Environmental allergies   . Esophageal reflux   . Feeding problem    FEEDING TUBE  . Hiatal hernia   .  Hyperlipidemia   . Hypertension   . Lower extremity edema   . Lupus (systemic lupus erythematosus) (HCC)   . Neuropathy (HCC)   . Osteopenia   . Oxygen deficiency    USES HS  . Peripheral neuropathy, hereditary/idiopathic   . Pneumonia   . Primary pulmonary hypertension (HCC)   . Pulmonary hypertension (HCC)   . Renal insufficiency   . Sciatica   . Shingles   . Shingles (herpes zoster) polyneuropathy March 2013   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  . Shortness of breath dyspnea   . Unspecified hypothyroidism   . Unspecified menopausal and postmenopausal disorder   . Urinary incontinence without sensory awareness   . Vitamin B deficiency     Prior to Admission medications   Medication Sig Start Date End Date Taking? Authorizing Provider  acetaminophen (TYLENOL) 325 MG tablet Take 650 mg by mouth every 4 (four) hours as needed for mild pain or fever. Reported on 02/23/2016   Yes Historical Provider, MD  ALPRAZolam Prudy Feeler) 0.5 MG tablet Take 0.5 mg by mouth at bedtime as needed for anxiety. Reported on 04/05/2016   Yes Historical Provider, MD  Ascorbic Acid (VITAMIN C) 1000 MG tablet Take 1,000 mg by mouth daily.    Yes Historical Provider, MD  cholecalciferol (VITAMIN D) 400 UNITS TABS tablet Take 400 Units by mouth daily.    Yes Historical Provider, MD  cyanocobalamin (,  VITAMIN B-12,) 1000 MCG/ML injection Inject 1 mL (1,000 mcg total) into the muscle every 30 (thirty) days. Pt uses on the 1st of every month. 09/29/15  Yes Sherlene Shams, MD  hydroxychloroquine (PLAQUENIL) 200 MG tablet Take 200 mg by mouth daily.   Yes Historical Provider, MD  letrozole (FEMARA) 2.5 MG tablet Take 1 tablet (2.5 mg total) by mouth daily. 02/04/16  Yes Loann Quill, NP  levocetirizine (XYZAL) 5 MG tablet Take 5 mg by mouth daily.   Yes Historical Provider, MD  levothyroxine (SYNTHROID) 175 MCG tablet Take 1 tablet (175 mcg total) by mouth daily before breakfast. 04/01/16  Yes Sherlene Shams, MD    loperamide (IMODIUM) 2 MG capsule Take 2-4 mg by mouth as needed for diarrhea or loose stools. Reported on 04/05/2016   Yes Historical Provider, MD  metoCLOPramide (REGLAN) 10 MG tablet Take 1 tablet (10 mg total) by mouth 4 (four) times daily -  before meals and at bedtime. 09/29/15  Yes Sherlene Shams, MD  metoprolol succinate (TOPROL-XL) 25 MG 24 hr tablet Take 12.5 mg by mouth daily.    Yes Historical Provider, MD  mycophenolate (CELLCEPT) 500 MG tablet Take 1,000 mg by mouth 2 (two) times daily.   Yes Historical Provider, MD  Nutritional Supplements (FEEDING SUPPLEMENT, JEVITY 1.5 CAL,) LIQD Place 237 mLs into feeding tube continuous.   Yes Historical Provider, MD  ondansetron (ZOFRAN-ODT) 4 MG disintegrating tablet Take 4 mg by mouth every 4 (four) hours as needed for nausea or vomiting.   Yes Historical Provider, MD  oxyCODONE-acetaminophen (PERCOCET/ROXICET) 5-325 MG tablet Take 1 tablet by mouth every 8 (eight) hours as needed for severe pain. 03/28/16  Yes Sherlene Shams, MD  OXYGEN Inhale 2 mLs into the lungs at bedtime.   Yes Historical Provider, MD  pantoprazole sodium (PROTONIX) 40 mg/20 mL PACK Place 40 mg into feeding tube 2 (two) times daily.   Yes Historical Provider, MD  Polyvinyl Alcohol-Povidone (REFRESH OP) Place 2 drops into both eyes daily as needed (dry eyes).    Yes Historical Provider, MD  predniSONE (DELTASONE) 5 MG tablet Take 5 mg by mouth daily.    Yes Historical Provider, MD  sterile water for irrigation 237 mL Give 2.5 mL/hr by tube continuous.   Yes Historical Provider, MD  sucralfate (CARAFATE) 1 G tablet Take 1 tablet (1 g total) by mouth 4 (four) times daily -  with meals and at bedtime. 07/20/15  Yes Sital Mody, MD  Tadalafil, PAH, (ADCIRCA) 20 MG TABS Take 40 mg by mouth daily.   Yes Historical Provider, MD  Water For Irrigation, Sterile (FREE WATER) SOLN Place 150 mLs into feeding tube 2 (two) times daily. 06/12/15  Yes Enid Baas, MD  valganciclovir  (VALCYTE) 50 MG/ML SOLR Place 18 mLs (900 mg total) into feeding tube 2 (two) times daily. 05/02/16   Gardiner Barefoot, MD    Review of Systems  Constitutional: negative for fevers and weight loss Respiratory: negative for cough Gastrointestinal: negative for diarrhea All other systems reviewed and are negative   Constitutional: in no apparent distress  Vitals:   05/25/16 1348  BP: 132/79  Pulse: 81  Temp: 98.1 F (36.7 C)   EYES: anicteric Cardiovascular: Cor Tachy Respiratory: CTA B; normal respiratory effort GI: Bowel sounds are normal, liver is not enlarged, spleen is not enlarged Musculoskeletal: no pedal edema noted Skin: negatives: no rash  Labs: Lab Results  Component Value Date   WBC 6.6 05/16/2016  HGB 11.8 (L) 05/16/2016   HCT 36.3 05/16/2016   MCV 100.3 (H) 05/16/2016   PLT 287.0 05/16/2016    Lab Results  Component Value Date   CREATININE 1.01 05/09/2016   BUN 36 (H) 05/09/2016   NA 141 05/09/2016   K 4.6 05/09/2016   CL 99 05/09/2016   CO2 31 05/09/2016    Lab Results  Component Value Date   ALT 17 05/09/2016   AST 23 05/09/2016   ALKPHOS 77 05/09/2016   BILITOT 0.2 05/09/2016   INR 1.22 06/10/2015

## 2016-06-06 ENCOUNTER — Telehealth: Payer: Self-pay | Admitting: *Deleted

## 2016-06-06 DIAGNOSIS — G5762 Lesion of plantar nerve, left lower limb: Secondary | ICD-10-CM | POA: Diagnosis not present

## 2016-06-06 MED ORDER — OXYCODONE-ACETAMINOPHEN 5-325 MG PO TABS: 1.0000 | ORAL_TABLET | Freq: Three times a day (TID) | ORAL | 0 refills | Status: DC | PRN

## 2016-06-06 NOTE — Telephone Encounter (Signed)
Notified patient that RX will be up front for her to pick up

## 2016-06-06 NOTE — Telephone Encounter (Signed)
Yes,  I refilled

## 2016-06-06 NOTE — Telephone Encounter (Signed)
Can we refill this? Last filled 03/28/16 and last appointment 05/03/16

## 2016-06-06 NOTE — Telephone Encounter (Signed)
Pt requested a medication refill for percocet

## 2016-06-09 DIAGNOSIS — B259 Cytomegaloviral disease, unspecified: Secondary | ICD-10-CM | POA: Diagnosis not present

## 2016-06-09 DIAGNOSIS — K2210 Ulcer of esophagus without bleeding: Secondary | ICD-10-CM | POA: Diagnosis not present

## 2016-06-10 ENCOUNTER — Other Ambulatory Visit: Payer: Self-pay | Admitting: *Deleted

## 2016-06-10 ENCOUNTER — Telehealth: Payer: Self-pay | Admitting: *Deleted

## 2016-06-10 DIAGNOSIS — E034 Atrophy of thyroid (acquired): Secondary | ICD-10-CM

## 2016-06-10 MED ORDER — METOCLOPRAMIDE HCL 10 MG PO TABS: 10.0000 mg | ORAL_TABLET | Freq: Three times a day (TID) | ORAL | 0 refills | Status: DC

## 2016-06-10 NOTE — Telephone Encounter (Signed)
Pt coming in for labs on Wed. Need lab orders placed.

## 2016-06-10 NOTE — Telephone Encounter (Signed)
Received fax refill request from [facility]

## 2016-06-13 NOTE — Telephone Encounter (Signed)
Orders added

## 2016-06-13 NOTE — Telephone Encounter (Signed)
tsh Free T4 only

## 2016-06-15 ENCOUNTER — Other Ambulatory Visit (INDEPENDENT_AMBULATORY_CARE_PROVIDER_SITE_OTHER): Payer: Medicare Other

## 2016-06-15 ENCOUNTER — Other Ambulatory Visit: Payer: Self-pay | Admitting: *Deleted

## 2016-06-15 ENCOUNTER — Ambulatory Visit (INDEPENDENT_AMBULATORY_CARE_PROVIDER_SITE_OTHER): Payer: Medicare Other

## 2016-06-15 DIAGNOSIS — E034 Atrophy of thyroid (acquired): Secondary | ICD-10-CM

## 2016-06-15 DIAGNOSIS — E039 Hypothyroidism, unspecified: Secondary | ICD-10-CM

## 2016-06-15 DIAGNOSIS — E119 Type 2 diabetes mellitus without complications: Secondary | ICD-10-CM | POA: Diagnosis not present

## 2016-06-15 DIAGNOSIS — Z23 Encounter for immunization: Secondary | ICD-10-CM | POA: Diagnosis not present

## 2016-06-15 LAB — MICROALBUMIN / CREATININE URINE RATIO
CREATININE, U: 102.1 mg/dL
MICROALB/CREAT RATIO: 2.5 mg/g (ref 0.0–30.0)
Microalb, Ur: 2.6 mg/dL — ABNORMAL HIGH (ref 0.0–1.9)

## 2016-06-15 LAB — T4, FREE: Free T4: 0.96 ng/dL (ref 0.60–1.60)

## 2016-06-15 LAB — TSH: TSH: 4.21 u[IU]/mL (ref 0.35–4.50)

## 2016-06-15 MED ORDER — METOCLOPRAMIDE HCL 10 MG PO TABS: 10.0000 mg | ORAL_TABLET | Freq: Three times a day (TID) | ORAL | 1 refills | Status: DC

## 2016-06-15 NOTE — Progress Notes (Unsigned)
Patient request 90 day supply request sent.

## 2016-06-15 NOTE — Addendum Note (Signed)
Addended by: Frutoso Chase A on: 06/15/2016 02:01 PM   Modules accepted: Orders

## 2016-06-16 ENCOUNTER — Telehealth: Payer: Self-pay | Admitting: Internal Medicine

## 2016-06-16 DIAGNOSIS — E1121 Type 2 diabetes mellitus with diabetic nephropathy: Secondary | ICD-10-CM

## 2016-06-16 MED ORDER — LISINOPRIL 5 MG PO TABS: 5.0000 mg | ORAL_TABLET | Freq: Every day | ORAL | 3 refills | Status: DC

## 2016-06-16 NOTE — Progress Notes (Signed)
Thank you :)

## 2016-06-16 NOTE — Telephone Encounter (Signed)
Thyroid function is WNL on current dose.  No current changes needed.Her urine test for  Microscopic protein was slightly abnormal/positive, which means that the diabetes is starting to affect her kidney function .  I would like her to start a low dose of lisinopril for protection of her kidneys and will send rx to her pharmacy.  She will need to have a BMET 1 week after starting medication to monitor for any changes in renal funcito nor potassium level. She can have that done here,  She does NOT need to fast for the blood test.

## 2016-06-17 NOTE — Telephone Encounter (Signed)
Spoke with patient verbalized understanding of lab results.  Okay with starting BP meds, she will return in a week for labs, scheduled. thanks

## 2016-06-21 ENCOUNTER — Telehealth: Payer: Self-pay | Admitting: *Deleted

## 2016-06-21 DIAGNOSIS — Z9010 Acquired absence of unspecified breast and nipple: Secondary | ICD-10-CM | POA: Diagnosis not present

## 2016-06-21 DIAGNOSIS — I061 Rheumatic aortic insufficiency: Secondary | ICD-10-CM | POA: Diagnosis not present

## 2016-06-21 DIAGNOSIS — E039 Hypothyroidism, unspecified: Secondary | ICD-10-CM | POA: Diagnosis not present

## 2016-06-21 DIAGNOSIS — Z79811 Long term (current) use of aromatase inhibitors: Secondary | ICD-10-CM | POA: Diagnosis not present

## 2016-06-21 DIAGNOSIS — R0600 Dyspnea, unspecified: Secondary | ICD-10-CM | POA: Diagnosis not present

## 2016-06-21 DIAGNOSIS — M3500 Sicca syndrome, unspecified: Secondary | ICD-10-CM | POA: Diagnosis not present

## 2016-06-21 DIAGNOSIS — K449 Diaphragmatic hernia without obstruction or gangrene: Secondary | ICD-10-CM | POA: Diagnosis not present

## 2016-06-21 DIAGNOSIS — I2729 Other secondary pulmonary hypertension: Secondary | ICD-10-CM | POA: Diagnosis not present

## 2016-06-21 DIAGNOSIS — Z853 Personal history of malignant neoplasm of breast: Secondary | ICD-10-CM | POA: Diagnosis not present

## 2016-06-21 DIAGNOSIS — K5790 Diverticulosis of intestine, part unspecified, without perforation or abscess without bleeding: Secondary | ICD-10-CM | POA: Diagnosis not present

## 2016-06-21 DIAGNOSIS — J329 Chronic sinusitis, unspecified: Secondary | ICD-10-CM | POA: Diagnosis not present

## 2016-06-21 DIAGNOSIS — K3189 Other diseases of stomach and duodenum: Secondary | ICD-10-CM | POA: Diagnosis not present

## 2016-06-21 DIAGNOSIS — J449 Chronic obstructive pulmonary disease, unspecified: Secondary | ICD-10-CM | POA: Diagnosis not present

## 2016-06-21 DIAGNOSIS — Z803 Family history of malignant neoplasm of breast: Secondary | ICD-10-CM | POA: Diagnosis not present

## 2016-06-21 DIAGNOSIS — M329 Systemic lupus erythematosus, unspecified: Secondary | ICD-10-CM | POA: Diagnosis not present

## 2016-06-21 DIAGNOSIS — I272 Pulmonary hypertension, unspecified: Secondary | ICD-10-CM | POA: Diagnosis not present

## 2016-06-21 DIAGNOSIS — Z87891 Personal history of nicotine dependence: Secondary | ICD-10-CM | POA: Diagnosis not present

## 2016-06-21 DIAGNOSIS — Z882 Allergy status to sulfonamides status: Secondary | ICD-10-CM | POA: Diagnosis not present

## 2016-06-21 DIAGNOSIS — Z885 Allergy status to narcotic agent status: Secondary | ICD-10-CM | POA: Diagnosis not present

## 2016-06-21 DIAGNOSIS — I7090 Unspecified atherosclerosis: Secondary | ICD-10-CM | POA: Diagnosis not present

## 2016-06-21 DIAGNOSIS — M328 Other forms of systemic lupus erythematosus: Secondary | ICD-10-CM | POA: Diagnosis not present

## 2016-06-21 DIAGNOSIS — I5030 Unspecified diastolic (congestive) heart failure: Secondary | ICD-10-CM | POA: Diagnosis not present

## 2016-06-21 DIAGNOSIS — Z923 Personal history of irradiation: Secondary | ICD-10-CM | POA: Diagnosis not present

## 2016-06-21 DIAGNOSIS — I5032 Chronic diastolic (congestive) heart failure: Secondary | ICD-10-CM | POA: Diagnosis not present

## 2016-06-21 DIAGNOSIS — Z880 Allergy status to penicillin: Secondary | ICD-10-CM | POA: Diagnosis not present

## 2016-06-21 DIAGNOSIS — K219 Gastro-esophageal reflux disease without esophagitis: Secondary | ICD-10-CM | POA: Diagnosis not present

## 2016-06-21 DIAGNOSIS — I4891 Unspecified atrial fibrillation: Secondary | ICD-10-CM | POA: Diagnosis not present

## 2016-06-21 NOTE — Telephone Encounter (Signed)
Pt stated that she was to receive a new Rx at the pharmacy for diabetic medication , she stated the pharmacy di not receive the medication. She requested a update  Pt contact [phone]

## 2016-06-21 NOTE — Telephone Encounter (Signed)
Notified patient the medication is lisinopril. Script at pharmacy.

## 2016-06-22 ENCOUNTER — Other Ambulatory Visit: Payer: Self-pay

## 2016-06-28 ENCOUNTER — Other Ambulatory Visit: Payer: Medicare Other

## 2016-06-30 ENCOUNTER — Ambulatory Visit: Payer: Medicare Other | Admitting: Anesthesiology

## 2016-06-30 ENCOUNTER — Other Ambulatory Visit (INDEPENDENT_AMBULATORY_CARE_PROVIDER_SITE_OTHER): Payer: Medicare Other

## 2016-06-30 ENCOUNTER — Ambulatory Visit
Admission: RE | Admit: 2016-06-30 | Discharge: 2016-06-30 | Disposition: A | Discharge status: Home / Self Care | Payer: Medicare Other | Source: Ambulatory Visit | Attending: Gastroenterology | Admitting: Gastroenterology

## 2016-06-30 ENCOUNTER — Encounter: Payer: Self-pay | Admitting: *Deleted

## 2016-06-30 ENCOUNTER — Encounter
Admission: RE | Disposition: A | Discharge status: Home / Self Care | Payer: Self-pay | Source: Ambulatory Visit | Attending: Gastroenterology

## 2016-06-30 DIAGNOSIS — Z931 Gastrostomy status: Secondary | ICD-10-CM | POA: Insufficient documentation

## 2016-06-30 DIAGNOSIS — E1121 Type 2 diabetes mellitus with diabetic nephropathy: Secondary | ICD-10-CM

## 2016-06-30 DIAGNOSIS — R1310 Dysphagia, unspecified: Secondary | ICD-10-CM | POA: Insufficient documentation

## 2016-06-30 DIAGNOSIS — Z7952 Long term (current) use of systemic steroids: Secondary | ICD-10-CM | POA: Insufficient documentation

## 2016-06-30 DIAGNOSIS — K210 Gastro-esophageal reflux disease with esophagitis: Secondary | ICD-10-CM | POA: Diagnosis not present

## 2016-06-30 DIAGNOSIS — K449 Diaphragmatic hernia without obstruction or gangrene: Secondary | ICD-10-CM | POA: Insufficient documentation

## 2016-06-30 DIAGNOSIS — Z87891 Personal history of nicotine dependence: Secondary | ICD-10-CM | POA: Diagnosis not present

## 2016-06-30 DIAGNOSIS — I270 Primary pulmonary hypertension: Secondary | ICD-10-CM | POA: Insufficient documentation

## 2016-06-30 DIAGNOSIS — E539 Vitamin B deficiency, unspecified: Secondary | ICD-10-CM | POA: Diagnosis not present

## 2016-06-30 DIAGNOSIS — Z853 Personal history of malignant neoplasm of breast: Secondary | ICD-10-CM | POA: Diagnosis not present

## 2016-06-30 DIAGNOSIS — G609 Hereditary and idiopathic neuropathy, unspecified: Secondary | ICD-10-CM | POA: Diagnosis not present

## 2016-06-30 DIAGNOSIS — I509 Heart failure, unspecified: Secondary | ICD-10-CM | POA: Insufficient documentation

## 2016-06-30 DIAGNOSIS — K2210 Ulcer of esophagus without bleeding: Secondary | ICD-10-CM | POA: Insufficient documentation

## 2016-06-30 DIAGNOSIS — K209 Esophagitis, unspecified: Secondary | ICD-10-CM | POA: Diagnosis not present

## 2016-06-30 DIAGNOSIS — Z79811 Long term (current) use of aromatase inhibitors: Secondary | ICD-10-CM | POA: Diagnosis not present

## 2016-06-30 DIAGNOSIS — E039 Hypothyroidism, unspecified: Secondary | ICD-10-CM | POA: Diagnosis not present

## 2016-06-30 DIAGNOSIS — B259 Cytomegaloviral disease, unspecified: Secondary | ICD-10-CM | POA: Diagnosis not present

## 2016-06-30 DIAGNOSIS — M329 Systemic lupus erythematosus, unspecified: Secondary | ICD-10-CM | POA: Insufficient documentation

## 2016-06-30 DIAGNOSIS — K208 Other esophagitis: Secondary | ICD-10-CM | POA: Diagnosis not present

## 2016-06-30 HISTORY — PX: ESOPHAGOGASTRODUODENOSCOPY (EGD) WITH PROPOFOL: SHX5813

## 2016-06-30 LAB — BASIC METABOLIC PANEL
BUN: 36 mg/dL — AB (ref 6–23)
CO2: 30 mEq/L (ref 19–32)
CREATININE: 1.06 mg/dL (ref 0.40–1.20)
Calcium: 9.3 mg/dL (ref 8.4–10.5)
Chloride: 105 mEq/L (ref 96–112)
GFR: 53.08 mL/min — AB (ref >60.00)
GLUCOSE: 99 mg/dL (ref 70–99)
Potassium: 4.1 mEq/L (ref 3.5–5.1)
Sodium: 142 mEq/L (ref 135–145)

## 2016-06-30 SURGERY — ESOPHAGOGASTRODUODENOSCOPY (EGD) WITH PROPOFOL
Anesthesia: General

## 2016-06-30 MED ORDER — LIDOCAINE HCL (CARDIAC) 10 MG/ML IV SOLN
INTRAVENOUS | Status: DC | PRN
  Administered 2016-06-30: 50 mg via INTRAVENOUS

## 2016-06-30 MED ORDER — PROPOFOL 500 MG/50ML IV EMUL
INTRAVENOUS | Status: DC | PRN
  Administered 2016-06-30: 125 ug/kg/min via INTRAVENOUS

## 2016-06-30 MED ORDER — GLYCOPYRROLATE 0.2 MG/ML IJ SOLN
INTRAMUSCULAR | Status: DC | PRN
  Administered 2016-06-30: 0.1 mg via INTRAVENOUS

## 2016-06-30 MED ORDER — SODIUM CHLORIDE 0.9 % IV SOLN
INTRAVENOUS | Status: DC
  Administered 2016-06-30: 10:00:00 via INTRAVENOUS

## 2016-06-30 MED ORDER — PHENYLEPHRINE HCL 10 MG/ML IJ SOLN
INTRAMUSCULAR | Status: DC | PRN
Start: 2016-06-30 — End: 2016-06-30
  Administered 2016-06-30 (×2): 100 ug via INTRAVENOUS

## 2016-06-30 MED ORDER — PROPOFOL 10 MG/ML IV BOLUS
INTRAVENOUS | Status: DC | PRN
  Administered 2016-06-30 (×3): 20 mg via INTRAVENOUS
  Administered 2016-06-30 (×2): 50 mg via INTRAVENOUS

## 2016-06-30 MED ORDER — SODIUM CHLORIDE 0.9 % IV SOLN: INTRAVENOUS | Status: DC

## 2016-06-30 MED ORDER — SODIUM CHLORIDE 0.9 % IV SOLN
INTRAVENOUS | Status: DC
  Administered 2016-06-30: 11:00:00 via INTRAVENOUS

## 2016-06-30 NOTE — Anesthesia Preprocedure Evaluation (Signed)
Anesthesia Evaluation  Patient identified by MRN, date of birth, ID band Patient awake    Reviewed: Allergy & Precautions, H&P , NPO status , Patient's Chart, lab work & pertinent test results, reviewed documented beta blocker date and time   Airway Mallampati: III   Neck ROM: full    Dental  (+) Poor Dentition   Pulmonary neg pulmonary ROS, shortness of breath and with exertion, pneumonia, resolved, former smoker,    Pulmonary exam normal        Cardiovascular hypertension, + Peripheral Vascular Disease and +CHF  negative cardio ROS Normal cardiovascular examAtrial Fibrillation  Rate:Normal     Neuro/Psych  Neuromuscular disease negative neurological ROS  negative psych ROS   GI/Hepatic negative GI ROS, Neg liver ROS, hiatal hernia, PUD, GERD  Medicated,  Endo/Other  negative endocrine ROSWell Controlled, Type 2Hypothyroidism   Renal/GU CRFnegative Renal ROS  negative genitourinary   Musculoskeletal   Abdominal   Peds  Hematology negative hematology ROS (+) anemia ,   Anesthesia Other Findings Past Medical History: No date: Acute glomerulonephritis with other specified * No date: Alveolar aeration decreased No date: Anemia No date: Arthritis No date: Atrial fibrillation ([place]) 06/03/2014: Breast cancer ([place])     Comment: positive, radiation No date: Cancer ([place])     Comment: Breast No date: CHF (congestive heart failure) (HCC) No date: Diaphragmatic hernia No date: DVT (deep venous thrombosis) (HCC) No date: Dysrhythmia No date: Environmental allergies No date: Esophageal reflux No date: Feeding problem     Comment: FEEDING TUBE No date: Hiatal hernia No date: Hyperlipidemia No date: Hypertension No date: Lower extremity edema No date: Lupus (systemic lupus erythematosus) (HCC) No date: Neuropathy ([place]) No date: Osteopenia No date: Oxygen deficiency     Comment: USES HS No date: Peripheral  neuropathy, hereditary/idiopathic No date: Pneumonia No date: Primary pulmonary hypertension ([place]) No date: Pulmonary hypertension No date: Renal insufficiency No date: Sciatica No date: Shingles March 2013: Shingles (herpes zoster) polyneuropathy     Comment: 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 No date: Shortness of breath dyspnea No date: Unspecified hypothyroidism No date: Unspecified menopausal and postmenopausal diso* No date: Urinary incontinence without sensory awareness No date: Vitamin B deficiency Past Surgical History: 06/03/2014: BREAST EXCISIONAL BIOPSY Left     Comment: positive No date: BREAST SURGERY 04/05/2016: CATARACT EXTRACTION W/PHACO Right     Comment: Procedure: CATARACT EXTRACTION PHACO AND               INTRAOCULAR LENS PLACEMENT (IOC);  Surgeon:               Birder Robson, MD;  Location: [facility] ORS;                Service: Ophthalmology;  Laterality: Right;  [place]              01:20 AP% 23.9 CDE 19.29 fluid pack lot #               [id number] 04/26/2016: CATARACT EXTRACTION W/PHACO Left     Comment: Procedure: CATARACT EXTRACTION PHACO AND               INTRAOCULAR LENS PLACEMENT (IOC);  Surgeon:               Birder Robson, MD;  Location: [facility] ORS;                Service: Ophthalmology;  Laterality: Left;   [place]  00:59 AP% 23.2 CDE 13.83 Fluid pack lot #               [id number] No date: DIAGNOSTIC LAPAROSCOPY 07/19/2015: ESOPHAGOGASTRODUODENOSCOPY N/A     Comment: Procedure: ESOPHAGOGASTRODUODENOSCOPY (EGD);                Surgeon: Hulen Luster, MD;  Location: [facility]               ENDOSCOPY;  Service: Gastroenterology;                Laterality: N/A; 03/28/2015: ESOPHAGOGASTRODUODENOSCOPY (EGD) WITH PROPOFOL N/A     Comment: Procedure: ESOPHAGOGASTRODUODENOSCOPY (EGD)               WITH PROPOFOL;  Surgeon: Robert Bellow, MD;              Location: [facility] ENDOSCOPY;  Service: Endoscopy;               Laterality: N/A; 06/10/2015:  ESOPHAGOGASTRODUODENOSCOPY (EGD) WITH PROPOFOL N/A     Comment: Procedure: ESOPHAGOGASTRODUODENOSCOPY (EGD)               WITH PROPOFOL;  Surgeon: Lollie Sails, MD;              Location: [facility] ENDOSCOPY;  Service: Endoscopy;               Laterality: N/A; 10/20/2015: ESOPHAGOGASTRODUODENOSCOPY (EGD) WITH PROPOFOL N/A     Comment: Procedure: ESOPHAGOGASTRODUODENOSCOPY (EGD)               WITH PROPOFOL;  Surgeon: Lollie Sails, MD;              Location: [facility] ENDOSCOPY;  Service: Endoscopy;               Laterality: N/A; 02/23/2016: ESOPHAGOGASTRODUODENOSCOPY (EGD) WITH PROPOFOL N/A     Comment: Procedure: ESOPHAGOGASTRODUODENOSCOPY (EGD)               WITH PROPOFOL;  Surgeon: Lollie Sails, MD;              Location: [facility] ENDOSCOPY;  Service: Endoscopy;               Laterality: N/A; 02/10/2015: KYPHOPLASTY N/A     Comment: Procedure: KYPHOPLASTY/L2;  Surgeon: Hessie Knows, MD;  Location: [facility] ORS;  Service:               Orthopedics;  Laterality: N/A; 03/11/2015: LAPAROTOMY N/A     Comment: Procedure: REDUCTION OF GASTRIC VOLVULUS,               SPLEENECTOMY, GASTRIC TUBE PLACEMENT;  Surgeon:              Robert Bellow, MD;  Location: [facility] ORS;                Service: General;  Laterality: N/A; No date: MASTECTOMY No date: MOHS SURGERY 06/12/2015: PERIPHERAL VASCULAR CATHETERIZATION N/A     Comment: Procedure: IVC Filter Insertion;  Surgeon:               Algernon Huxley, MD;  Location: [place] [facility]               LAB;  Service: Cardiovascular;  Laterality:               N/A; No date: STOMACH SURGERY     Comment: for  volvolus BMI    Body Mass Index:  21.95 kg/m     Reproductive/Obstetrics                             Anesthesia Physical Anesthesia Plan  ASA: IV  Anesthesia Plan: General   Post-op Pain Management:    Induction:   Airway Management Planned:   Additional Equipment:   Intra-op Plan:   Post-operative  Plan:   Informed Consent: I have reviewed the patients History and Physical, chart, labs and discussed the procedure including the risks, benefits and alternatives for the proposed anesthesia with the patient or authorized representative who has indicated his/her understanding and acceptance.   Dental Advisory Given  Plan Discussed with: CRNA  Anesthesia Plan Comments:         Anesthesia Quick Evaluation

## 2016-06-30 NOTE — H&P (Signed)
Outpatient short stay form Pre-procedure 06/30/2016 10:26 AM Lollie Sails MD  Primary Physician: Dr Deborra Medina  Reason for visit:  EGD  History of present illness:  Patient is a 81 year old female presenting today as above. She has a personal history of a gastric volvulus necessitating surgery that included a gastropexy.  This was followed by the development of a very severe erosive esophagitis. She had been dilated without benefit as foods seem to pooling distal esophagus. She was placed on a restriction of oral intake and most of her feeds were done by her PEG. On her last EGD she was found to have CMV on biopsies, patient does take CellCept and plaque were noted for lupus, and she has subsequently been treated. She continues to use a feeding tube for some for nutrition but has been able to broaden out some of her oral intake. She continues on a combination of Reglan and Carafate and proton pump inhibitor. He has been able to maintain and indeed recently gained weight.  She takes no blood thinning agents or aspirin products currently.    Current Facility-Administered Medications:  .  0.9 %  sodium chloride infusion, , Intravenous, Continuous, Lollie Sails, MD, Last Rate: 20 mL/hr at 06/30/16 1005 .  0.9 %  sodium chloride infusion, , Intravenous, Continuous, Lollie Sails, MD .  0.9 %  sodium chloride infusion, , Intravenous, Continuous, Lollie Sails, MD  Prescriptions Prior to Admission  Medication Sig Dispense Refill Last Dose  . Ascorbic Acid (VITAMIN C) 1000 MG tablet Take 1,000 mg by mouth daily.    06/29/2016 at Unknown time  . cholecalciferol (VITAMIN D) 400 UNITS TABS tablet Take 400 Units by mouth daily.    06/29/2016 at Unknown time  . cyanocobalamin (,VITAMIN B-12,) 1000 MCG/ML injection Inject 1 mL (1,000 mcg total) into the muscle every 30 (thirty) days. Pt uses on the 1st of every month. 10 mL 1 06/29/2016 at Unknown time  . hydroxychloroquine (PLAQUENIL)  200 MG tablet Take 200 mg by mouth daily.   06/29/2016 at Unknown time  . levothyroxine (SYNTHROID) 175 MCG tablet Take 1 tablet (175 mcg total) by mouth daily before breakfast. 90 tablet 1 06/29/2016 at Unknown time  . lisinopril (PRINIVIL,ZESTRIL) 5 MG tablet Take 1 tablet (5 mg total) by mouth daily. 90 tablet 3 06/29/2016 at Unknown time  . metoprolol succinate (TOPROL-XL) 25 MG 24 hr tablet Take 12.5 mg by mouth daily.    06/30/2016 at 0630  . Nutritional Supplements (FEEDING SUPPLEMENT, JEVITY 1.5 CAL,) LIQD Place 237 mLs into feeding tube continuous.   06/29/2016 at 2200  . pantoprazole sodium (PROTONIX) 40 mg/20 mL PACK Place 40 mg into feeding tube 2 (two) times daily.   06/29/2016 at Unknown time  . predniSONE (DELTASONE) 5 MG tablet Take 5 mg by mouth daily.    06/29/2016 at Unknown time  . sucralfate (CARAFATE) 1 G tablet Take 1 tablet (1 g total) by mouth 4 (four) times daily -  with meals and at bedtime. 120 tablet 0 06/29/2016 at Unknown time  . valganciclovir (VALCYTE) 50 MG/ML SOLR Place 18 mLs (900 mg total) into feeding tube 2 (two) times daily. 800 mL 1 06/29/2016 at Unknown time  . Water For Irrigation, Sterile (FREE WATER) SOLN Place 150 mLs into feeding tube 2 (two) times daily. 1500 mL 2 06/29/2016 at Unknown time  . acetaminophen (TYLENOL) 325 MG tablet Take 650 mg by mouth every 4 (four) hours as needed for mild pain  or fever. Reported on 02/23/2016   Taking  . ALPRAZolam (XANAX) 0.5 MG tablet Take 0.5 mg by mouth at bedtime as needed for anxiety. Reported on 04/05/2016   Taking  . letrozole (FEMARA) 2.5 MG tablet Take 1 tablet (2.5 mg total) by mouth daily. 90 tablet 3 Taking  . levocetirizine (XYZAL) 5 MG tablet Take 5 mg by mouth daily.   Taking  . loperamide (IMODIUM) 2 MG capsule Take 2-4 mg by mouth as needed for diarrhea or loose stools. Reported on 04/05/2016   Taking  . metoCLOPramide (REGLAN) 10 MG tablet Take 1 tablet (10 mg total) by mouth 4 (four) times daily -   before meals and at bedtime. 360 tablet 1   . mycophenolate (CELLCEPT) 500 MG tablet Take 1,000 mg by mouth 2 (two) times daily.   Taking  . ondansetron (ZOFRAN-ODT) 4 MG disintegrating tablet Take 4 mg by mouth every 4 (four) hours as needed for nausea or vomiting.   Taking  . oxyCODONE-acetaminophen (PERCOCET/ROXICET) 5-325 MG tablet Take 1 tablet by mouth every 8 (eight) hours as needed for severe pain. 90 tablet 0   . OXYGEN Inhale 2 mLs into the lungs at bedtime.   Taking  . Polyvinyl Alcohol-Povidone (REFRESH OP) Place 2 drops into both eyes daily as needed (dry eyes).    Taking  . sterile water for irrigation 237 mL Give 2.5 mL/hr by tube continuous.   Taking  . Tadalafil, PAH, (ADCIRCA) 20 MG TABS Take 40 mg by mouth daily.   Taking     Allergies  Allergen Reactions  . Amoxicillin Other (See Comments)    Reaction:  Stomach cramps   . Cefuroxime Itching  . Contrast Media [Iodinated Diagnostic Agents] Hives  . Metrizamide Hives  . Morphine And Related     Reaction: face flushed    . Propoxyphene     Stomach cramp  . Cephalexin Rash  . Sulfa Antibiotics Rash  . Sulfonamide Derivatives Rash  . Tramadol Hcl Rash  . Venlafaxine Rash     Past Medical History:  Diagnosis Date  . Acute glomerulonephritis with other specified pathological lesion in kidney in disease classified elsewhere(580.81)   . Alveolar aeration decreased   . Anemia   . Arthritis   . Atrial fibrillation ([place])   . Breast cancer ([place]) 06/03/2014   positive, radiation  . Cancer (HCC)    Breast  . CHF (congestive heart failure) ([place])   . Diaphragmatic hernia   . DVT (deep venous thrombosis) ([place])   . Dysrhythmia   . Environmental allergies   . Esophageal reflux   . Feeding problem    FEEDING TUBE  . Hiatal hernia   . Hyperlipidemia   . Hypertension   . Lower extremity edema   . Lupus (systemic lupus erythematosus) ([place])   . Neuropathy ([place])   . Osteopenia   . Oxygen deficiency    USES HS  .  Peripheral neuropathy, hereditary/idiopathic   . Pneumonia   . Primary pulmonary hypertension ([place])   . Pulmonary hypertension   . Renal insufficiency   . Sciatica   . Shingles   . Shingles (herpes zoster) polyneuropathy March 2013   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  . Shortness of breath dyspnea   . Unspecified hypothyroidism   . Unspecified menopausal and postmenopausal disorder   . Urinary incontinence without sensory awareness   . Vitamin B deficiency     Review of systems:      Physical Exam  Heart and lungs: Regular rate and rhythm without rub or gallop, lungs are bilaterally clear.    HEENT: Normocephalic atraumatic eyes are anicteric    Other:     Pertinant exam for procedure: Soft nontender nondistended bowel sounds positive normoactive.    Planned proceedures: EGD and indicated procedures. I have discussed the risks benefits and complications of procedures to include not limited to bleeding, infection, perforation and the risk of sedation and the patient wishes to proceed.    Lollie Sails, MD Gastroenterology 06/30/2016  10:26 AM

## 2016-06-30 NOTE — Transfer of Care (Signed)
Immediate Anesthesia Transfer of Care Note  Patient: Diana [name]  Procedure(s) Performed: Procedure(s): ESOPHAGOGASTRODUODENOSCOPY (EGD) WITH PROPOFOL (N/A)  Patient Location: PACU  Anesthesia Type:General  Level of Consciousness: awake and oriented  Airway & Oxygen Therapy: Patient Spontanous Breathing and Patient connected to nasal cannula oxygen  Post-op Assessment: Report given to RN and Post -op Vital signs reviewed and stable  Post vital signs: Reviewed and stable  Last Vitals:  Vitals:   06/30/16 0916 06/30/16 1102  BP: 118/68 (!) 94/57  Pulse: 78 81  Resp: 18 18  Temp: 37.2 C 36.6 C    Last Pain:  Vitals:   06/30/16 1102  TempSrc: Tympanic         Complications: No apparent anesthesia complications

## 2016-06-30 NOTE — Op Note (Signed)
[facility] Gastroenterology Patient Name: Diana [name] Procedure Date: 06/30/2016 10:30 AM MRN: [id number] Account #: [id number] Date of Birth: 06/01/1935 Admit Type: Outpatient Age: 81 Room: [facility] ENDO ROOM 4 Gender: Female Note Status: Finalized Procedure:            Upper GI endoscopy Indications:          Dysphagia, and severe erosive esophagitis. history of                        CMV esophagitis Providers:            Christena Deem, MD Referring MD:         Duncan Dull, MD (Referring MD) Medicines:            Monitored Anesthesia Care Complications:        No immediate complications. Procedure:            Pre-Anesthesia Assessment:                       - ASA Grade Assessment: III - A patient with severe                        systemic disease.                       After obtaining informed consent, the endoscope was                        passed under direct vision. Throughout the procedure,                        the patient's blood pressure, pulse, and oxygen                        saturations were monitored continuously. The Endoscope                        was introduced through the mouth, and advanced to the                        third part of duodenum. The upper GI endoscopy was                        accomplished without difficulty. The patient tolerated                        the procedure well. Findings:      LA Grade D (one or more mucosal breaks involving at least 75% of       esophageal circumference) esophagitis with no bleeding though friable to       passage of the scope was found 30 to 32 cm from the incisors. There is       what appears as papillary or granular tissue at 30 cm. The scope passes       through the GE junction easily. Biopsies were taken with a cold forceps       for histology from 30 and 32 cm from the distal esophagus. The eschar       noted is less than that seen previously though there is still some   friability in the      gej.  There was a dilatory effect seen on return of the scope to the       distal esophagus from the distal stomach.      A medium-sized hiatal hernia was present. It was difficult to maintain       insufflation due to air passing back up the esophagus.      There was evidence of an intact gastrostomy with a patent G-tube present       in the gastric body. This was characterized by healthy appearing mucosa.      The examined duodenum was normal though the lumenal pathway is angulated. Impression:           - LA Grade D esophagitis. Biopsied.                       - Medium-sized hiatal hernia.                       - Intact gastrostomy with a patent G-tube present                        characterized by healthy appearing mucosa.                       - Normal examined duodenum. Recommendation:       - Discharge patient to home.                       - Continue present medications.                       - Return to GI clinic in 1 month.                       - Continue intake of nutrition of PEG and limited oral                        as current. Procedure Code(s):    --- Professional ---                       [phone], Esophagogastroduodenoscopy, flexible, transoral;                        with biopsy, single or multiple Diagnosis Code(s):    --- Professional ---                       K20.9, Esophagitis, unspecified                       K44.9, Diaphragmatic hernia without obstruction or                        gangrene                       Z93.1, Gastrostomy status                       R13.10, Dysphagia, unspecified CPT copyright 2016 American Medical Association. All rights reserved. The codes documented in this report are preliminary and upon coder review may  be revised to meet current compliance requirements. Christena Deem, MD 06/30/2016 11:12:05 AM This report has been signed electronically. Number of  Addenda: 0 Note Initiated On: 06/30/2016 10:30 AM       [facility]

## 2016-07-01 LAB — SURGICAL PATHOLOGY

## 2016-07-04 ENCOUNTER — Encounter: Payer: Self-pay | Admitting: *Deleted

## 2016-07-04 NOTE — Anesthesia Postprocedure Evaluation (Signed)
Anesthesia Post Note  Patient: Diana [name]  Procedure(s) Performed: Procedure(s) (LRB): ESOPHAGOGASTRODUODENOSCOPY (EGD) WITH PROPOFOL (N/A)  Patient location during evaluation: PACU Anesthesia Type: General Level of consciousness: awake and alert Pain management: pain level controlled Vital Signs Assessment: post-procedure vital signs reviewed and stable Respiratory status: spontaneous breathing, nonlabored ventilation, respiratory function stable and patient connected to nasal cannula oxygen Cardiovascular status: blood pressure returned to baseline and stable Postop Assessment: no signs of nausea or vomiting Anesthetic complications: no    Last Vitals:  Vitals:   06/30/16 1110 06/30/16 1120  BP: 105/68 110/67  Pulse:    Resp:    Temp:      Last Pain:  Vitals:   06/30/16 1102  TempSrc: Tympanic                 Molli Barrows

## 2016-07-12 ENCOUNTER — Ambulatory Visit: Payer: Self-pay | Admitting: *Deleted

## 2016-07-13 ENCOUNTER — Other Ambulatory Visit: Payer: Self-pay | Admitting: Internal Medicine

## 2016-07-13 ENCOUNTER — Telehealth: Payer: Self-pay | Admitting: *Deleted

## 2016-07-13 ENCOUNTER — Other Ambulatory Visit: Payer: Self-pay | Admitting: *Deleted

## 2016-07-13 DIAGNOSIS — K208 Other esophagitis without bleeding: Secondary | ICD-10-CM

## 2016-07-13 DIAGNOSIS — B258 Other cytomegaloviral diseases: Principal | ICD-10-CM

## 2016-07-13 MED ORDER — VALGANCICLOVIR HCL 50 MG/ML PO SOLR: 900.0000 mg | Freq: Two times a day (BID) | ORAL | 1 refills | Status: DC

## 2016-07-13 NOTE — Telephone Encounter (Signed)
Called patient, appts scheduled and  will call PCP to give lab orders. Dr. Derrel Nip [phone] Myrtis Hopping

## 2016-07-13 NOTE — Telephone Encounter (Signed)
-----   Message from Thayer Headings, MD sent at 07/13/2016  2:55 PM EDT ----- I discussed recent results with her GI Dr. Today at [facility].  I would like her to get a lab - CMV DNA in blood and CBC with diff (maybe at her PCPs office in [place]) and restart the ganciclovir 900 mg twice a day aafter the lab.  The EGD was better but not healed so we want to restart the medication.  I would like another CBC a week later and follow up with PharmD in 2 weeks (and another cbc) and follow up with me end of November. I will order above if you could let her know.   I have copied pharmacy so they are aware. Thanks!

## 2016-07-15 ENCOUNTER — Other Ambulatory Visit (INDEPENDENT_AMBULATORY_CARE_PROVIDER_SITE_OTHER): Payer: Medicare Other

## 2016-07-15 DIAGNOSIS — K208 Other esophagitis: Secondary | ICD-10-CM | POA: Diagnosis not present

## 2016-07-15 DIAGNOSIS — B258 Other cytomegaloviral diseases: Secondary | ICD-10-CM | POA: Diagnosis not present

## 2016-07-15 DIAGNOSIS — B259 Cytomegaloviral disease, unspecified: Secondary | ICD-10-CM

## 2016-07-15 NOTE — Addendum Note (Signed)
Addended by: Frutoso Chase A on: 07/15/2016 08:16 AM   Modules accepted: Orders

## 2016-07-15 NOTE — Addendum Note (Signed)
Addended by: Frutoso Chase A on: 07/15/2016 02:05 PM   Modules accepted: Orders

## 2016-07-18 ENCOUNTER — Other Ambulatory Visit: Payer: Self-pay | Admitting: *Deleted

## 2016-07-18 NOTE — Patient Outreach (Signed)
[facility]) Care Management  07/18/2016  TAKINA BUSSER 07-04-1935 [id number]   RN Health Coach attempted #1 Follow up outreach call to patient.  Patient was unavailable. HIPPA compliance voicemail message was left with return callback number.  Plan: RN will call patient again within 14 days.    [facility] [phone]

## 2016-07-20 LAB — CBC WITH DIFFERENTIAL/PLATELET
Basophils Absolute: 0 10*3/uL (ref 0.0–0.2)
Basos: 1 %
EOS (ABSOLUTE): 0.1 10*3/uL (ref 0.0–0.4)
EOS: 2 %
HEMATOCRIT: 32.6 % — AB (ref 34.0–46.6)
Hemoglobin: 10.9 g/dL — ABNORMAL LOW (ref 11.1–15.9)
IMMATURE GRANS (ABS): 0 10*3/uL (ref 0.0–0.1)
IMMATURE GRANULOCYTES: 0 %
LYMPHS: 19 %
Lymphocytes Absolute: 1.1 10*3/uL (ref 0.7–3.1)
MCH: 33.1 pg — ABNORMAL HIGH (ref 26.6–33.0)
MCHC: 33.4 g/dL (ref 31.5–35.7)
MCV: 99 fL — ABNORMAL HIGH (ref 79–97)
MONOCYTES: 5 %
MONOS ABS: 0.3 10*3/uL (ref 0.1–0.9)
NEUTROS PCT: 73 %
Neutrophils Absolute: 4 10*3/uL (ref 1.4–7.0)
Platelets: 341 10*3/uL (ref 150–379)
RBC: 3.29 x10E6/uL — AB (ref 3.77–5.28)
RDW: 14.8 % (ref 12.3–15.4)
WBC: 5.6 10*3/uL (ref 3.4–10.8)

## 2016-07-20 LAB — CMV DNA, QUANTITATIVE, PCR: CMV DNA Quant: POSITIVE IU/mL

## 2016-07-21 ENCOUNTER — Telehealth: Payer: Self-pay | Admitting: Internal Medicine

## 2016-07-21 NOTE — Telephone Encounter (Signed)
Pt called requesting a refill on oxyCODONE-acetaminophen (PERCOCET/ROXICET) 5-325 MG tablet. Last filled on 9/18. Pt states that she has been taking 2 a day. Please advise, thank you!  Call pt @ [phone]

## 2016-07-22 MED ORDER — OXYCODONE-ACETAMINOPHEN 5-325 MG PO TABS: 1.0000 | ORAL_TABLET | Freq: Two times a day (BID) | ORAL | 0 refills | Status: DC | PRN

## 2016-07-22 NOTE — Telephone Encounter (Signed)
Refilled for Nov and Dec,  Needs OV I prior to Jan refill

## 2016-07-22 NOTE — Telephone Encounter (Signed)
Pt requested a update on this medication, she hs one pill left

## 2016-07-22 NOTE — Telephone Encounter (Signed)
Last OV 8/17 last refill for 90 on 06/06/16 Ok to fill?

## 2016-07-22 NOTE — Telephone Encounter (Signed)
Patient notified ready for pick up and placed at front desk patient aware needs appointment for December.

## 2016-07-27 ENCOUNTER — Other Ambulatory Visit: Payer: Self-pay | Admitting: Internal Medicine

## 2016-07-27 ENCOUNTER — Ambulatory Visit (INDEPENDENT_AMBULATORY_CARE_PROVIDER_SITE_OTHER): Payer: Medicare Other | Admitting: Pharmacist Clinician (PhC)/ Clinical Pharmacy Specialist

## 2016-07-27 DIAGNOSIS — K208 Other esophagitis without bleeding: Secondary | ICD-10-CM

## 2016-07-27 DIAGNOSIS — B258 Other cytomegaloviral diseases: Secondary | ICD-10-CM

## 2016-07-27 LAB — COMPREHENSIVE METABOLIC PANEL
ALBUMIN: 3.8 g/dL (ref 3.6–5.1)
ALK PHOS: 58 U/L (ref 33–130)
ALT: 9 U/L (ref 6–29)
AST: 19 U/L (ref 10–35)
BILIRUBIN TOTAL: 0.2 mg/dL (ref 0.2–1.2)
BUN: 40 mg/dL — ABNORMAL HIGH (ref 7–25)
CALCIUM: 9.2 mg/dL (ref 8.6–10.4)
CO2: 26 mmol/L (ref 20–31)
CREATININE: 1.47 mg/dL — AB (ref 0.60–0.93)
Chloride: 103 mmol/L (ref 98–110)
Glucose, Bld: 129 mg/dL — ABNORMAL HIGH (ref 65–99)
Potassium: 4.5 mmol/L (ref 3.5–5.3)
SODIUM: 141 mmol/L (ref 135–146)
TOTAL PROTEIN: 6.7 g/dL (ref 6.1–8.1)

## 2016-07-27 LAB — CBC
HEMATOCRIT: 30.8 % — AB (ref 35.0–45.0)
HEMOGLOBIN: 9.9 g/dL — AB (ref 11.7–15.5)
MCH: 32.7 pg (ref 27.0–33.0)
MCHC: 32.1 g/dL (ref 32.0–36.0)
MCV: 101.7 fL — ABNORMAL HIGH (ref 80.0–100.0)
MPV: 8.8 fL (ref 7.5–12.5)
Platelets: 268 10*3/uL (ref 140–400)
RBC: 3.03 MIL/uL — AB (ref 3.80–5.10)
RDW: 15.7 % — ABNORMAL HIGH (ref 11.0–15.0)
WBC: 5.7 10*3/uL (ref 3.8–10.8)

## 2016-07-27 NOTE — Patient Instructions (Signed)
Continue to take your Valganciclovir twice a day. To help with your nausea, try taking a Zofran tablet 30 minutes prior to each dose. We will get labs today and you can follow up with Dr. Linus Salmons in 3 weeks.

## 2016-07-27 NOTE — Progress Notes (Signed)
HPI: Diana [name] is a 81 y.o. female with recurrent CMV esophagitis.  Allergies: Allergies  Allergen Reactions  . Amoxicillin Other (See Comments)    Reaction:  Stomach cramps   . Cefuroxime Itching  . Contrast Media [Iodinated Diagnostic Agents] Hives  . Metrizamide Hives  . Morphine And Related     Reaction: face flushed    . Propoxyphene     Stomach cramp  . Cephalexin Rash  . Sulfa Antibiotics Rash  . Sulfonamide Derivatives Rash  . Tramadol Hcl Rash  . Venlafaxine Rash    Vitals:    Past Medical History: Past Medical History:  Diagnosis Date  . Acute glomerulonephritis with other specified pathological lesion in kidney in disease classified elsewhere(580.81)   . Alveolar aeration decreased   . Anemia   . Arthritis   . Atrial fibrillation ([place])   . Breast cancer ([place]) 06/03/2014   positive, radiation  . Cancer (HCC)    Breast  . CHF (congestive heart failure) ([place])   . Diaphragmatic hernia   . DVT (deep venous thrombosis) ([place])   . Dysrhythmia   . Environmental allergies   . Esophageal reflux   . Feeding problem    FEEDING TUBE  . Hiatal hernia   . Hyperlipidemia   . Hypertension   . Lower extremity edema   . Lupus (systemic lupus erythematosus) ([place])   . Neuropathy ([place])   . Osteopenia   . Oxygen deficiency    USES HS  . Peripheral neuropathy, hereditary/idiopathic   . Pneumonia   . Primary pulmonary hypertension ([place])   . Pulmonary hypertension   . Renal insufficiency   . Sciatica   . Shingles   . Shingles (herpes zoster) polyneuropathy March 2013   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  . Shortness of breath dyspnea   . Unspecified hypothyroidism   . Unspecified menopausal and postmenopausal disorder   . Urinary incontinence without sensory awareness   . Vitamin B deficiency     Social History: Social History   Social History  . Marital status: Divorced    Spouse name: N/A  . Number of children: N/A  . Years of education: N/A    Occupational History  . 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 Smoking status: Former Smoker    Quit date: 05/25/1991  . Smokeless tobacco: Never Used  . Alcohol use Yes     Comment: rare  . Drug use: No  . Sexual activity: Not on file   Other Topics Concern  . Not on file   Social History Narrative  . No narrative on file   Current Regimen: Valganciclovir 900 mg BID   Labs:  CBC    Component Value Date/Time   WBC 5.6 07/15/2016 1405   WBC 6.6 05/16/2016 1508   RBC 3.29 (L) 07/15/2016 1405   RBC 3.62 (L) 05/16/2016 1508   HGB 11.8 (L) 05/16/2016 1508   HGB 12.0 10/02/2014 1103   HCT 32.6 (L) 07/15/2016 1405   PLT 341 07/15/2016 1405   MCV 99 (H) 07/15/2016 1405   MCV 90 10/02/2014 1103   MCH 33.1 (H) 07/15/2016 1405   MCH 32.1 05/02/2016 1613   MCHC 33.4 07/15/2016 1405   MCHC 32.6 05/16/2016 1508   RDW 14.8 07/15/2016 1405   RDW 16.0 (H) 10/02/2014 1103   LYMPHSABS 1.1 07/15/2016 1405   LYMPHSABS 1.0 10/02/2014 1103   MONOABS 0.0 (L) 05/16/2016 1508   MONOABS  0.5 10/02/2014 1103   EOSABS 0.1 07/15/2016 1405   EOSABS 0.1 10/02/2014 1103   BASOSABS 0.0 07/15/2016 1405   BASOSABS 0.1 10/02/2014 1103    Assessment: Ms. Paulson is a 81 year old female who presents for recurrent CMV esophagitis. She has been taking her valganciclovir consistently for the last week or so. She reports that her symptoms of esophagitis are slightly improved but she is experiencing significant nausea with each dose of medication. She has been taking ondansetron to help with this adverse effect and reports that she has relief when she uses it. She usually takes the ondansetron after her valganciclovir when she feels nauseous. I recommended that she try to stay ahead of the nausea by taking the ondansetron about 30 minutes before her dose of valganciclovir. Her most recent CBC on 10/27 showed a hemoglobin of 10.9 and platelets 341. We are repeating this  today.  Recommendations: Continue valganciclovir 900 mg BID Will get CBC today Patient will try using ondansetron prior to doses to help prevent nausea Follow up appointment scheduled with Dr. Linus Salmons for 08/17/2016  Dimitri Ped, PharmD. PGY-2 Infectious Diseases Pharmacy Resident Pager: [phone] 07/27/2016, 2:03 PM

## 2016-07-28 ENCOUNTER — Ambulatory Visit: Payer: Self-pay | Admitting: *Deleted

## 2016-07-28 NOTE — Progress Notes (Signed)
We will add on a differential to assess her ANC. Will need to call to change dose of Valcyte due to renal dysfunction.

## 2016-07-29 ENCOUNTER — Telehealth: Payer: Self-pay | Admitting: Pharmacist Clinician (PhC)/ Clinical Pharmacy Specialist

## 2016-07-29 DIAGNOSIS — B259 Cytomegaloviral disease, unspecified: Secondary | ICD-10-CM

## 2016-07-29 LAB — DIFFERENTIAL
Basophils Absolute: 57 cells/uL (ref 0–200)
Basophils Relative: 1 %
EOS PCT: 1 %
Eosinophils Absolute: 57 cells/uL (ref 15–500)
LYMPHS ABS: 1197 {cells}/uL (ref 850–3900)
Lymphocytes Relative: 21 %
MONO ABS: 57 {cells}/uL — AB (ref 200–950)
Monocytes Relative: 1 %
NEUTROS ABS: 4332 {cells}/uL (ref 1500–7800)
NEUTROS PCT: 76 %

## 2016-07-29 NOTE — Telephone Encounter (Signed)
Called her to see if we can change change the Valcyte dose. Left a voicemail to call back. Put in orders for CMP and CBC/diff

## 2016-07-29 NOTE — Telephone Encounter (Signed)
-----   Message from Thayer Headings, MD sent at 07/28/2016  1:19 PM EST ----- Lovena Le, Yes, go ahead and adjust it and recheck next week at Dr. Lupita Dawn office, if ok with her - CBC with diff, bmp.  Thanks  Rob ----- Message ----- From: Ricka Burdock, [facility] Sent: 07/28/2016  12:00 PM To: [address] [place], RPH, Thayer Headings, MD  Possibly, I was looking up information on valgancyclovir and it does not state to adjust the dose based on anemia but does recommend discontinuing if the Hgb drops to < 8 mg/dL.   Her renal function is worse than it was ~ 1 month ago. Her estimated CrCl ~35 ml/min. We need to adjust the dose down 450 mg daily. This will probably help with the anemia too.  She has an appointment with you on 11/29 but it could be reasonable for her to get another BMET/CBC with diff in a week to make sure she doesn't continue to to trend down. She lives in [place] and likes to get labs drawn there by her PCP.  What do you think?   ----- Message ----- From: Thayer Headings, MD Sent: 07/28/2016  10:18 AM To: Ricka Burdock, RPH  Do you think her worsening anemia is due to valgancyclovir?

## 2016-08-01 NOTE — Telephone Encounter (Signed)
Spoke with Ms. Blok about the need to decrease her valganciclovir to 450 mg daily, which is adjusted for her renal function from her most recent BMET on 11/08. She confirmed that she does have the liquid medication at home and will start taking 9 mL (450 mg) daily. I reconfirmed the new dose with the patient and she states that she also wrote this information down.  I also instructed her to get labs this week at Dr. Lupita Dawn office and the orders are already in Epic for a BMET and CBC w/ differential. She plans to go on Wednesday and we will follow up with these labs. She has a scheduled appointment with Dr. Linus Salmons on 11/29 for follow up.  Dimitri Ped, PharmD. PGY-2 Infectious Diseases Pharmacy Resident Pager: [phone] 08/01/2016, 3:51 PM

## 2016-08-03 ENCOUNTER — Telehealth: Payer: Self-pay

## 2016-08-03 NOTE — Telephone Encounter (Signed)
Entered in error

## 2016-08-04 ENCOUNTER — Other Ambulatory Visit (INDEPENDENT_AMBULATORY_CARE_PROVIDER_SITE_OTHER): Payer: Medicare Other

## 2016-08-04 ENCOUNTER — Ambulatory Visit (INDEPENDENT_AMBULATORY_CARE_PROVIDER_SITE_OTHER): Payer: Medicare Other | Admitting: Vascular Surgery

## 2016-08-04 ENCOUNTER — Encounter (INDEPENDENT_AMBULATORY_CARE_PROVIDER_SITE_OTHER): Payer: Self-pay | Admitting: Vascular Surgery

## 2016-08-04 VITALS — BP 123/69 | HR 88 | Resp 16 | Ht 62.0 in | Wt 127.0 lb

## 2016-08-04 DIAGNOSIS — IMO0001 Reserved for inherently not codable concepts without codable children: Secondary | ICD-10-CM

## 2016-08-04 DIAGNOSIS — I10 Essential (primary) hypertension: Secondary | ICD-10-CM

## 2016-08-04 DIAGNOSIS — B259 Cytomegaloviral disease, unspecified: Secondary | ICD-10-CM | POA: Diagnosis not present

## 2016-08-04 DIAGNOSIS — I82501 Chronic embolism and thrombosis of unspecified deep veins of right lower extremity: Secondary | ICD-10-CM

## 2016-08-04 DIAGNOSIS — I872 Venous insufficiency (chronic) (peripheral): Secondary | ICD-10-CM | POA: Diagnosis not present

## 2016-08-04 LAB — CBC WITH DIFFERENTIAL/PLATELET
BASOS ABS: 49 {cells}/uL (ref 0–200)
BASOS PCT: 1 %
EOS PCT: 0 %
Eosinophils Absolute: 0 cells/uL — ABNORMAL LOW (ref 15–500)
HCT: 31.9 % — ABNORMAL LOW (ref 35.0–45.0)
Hemoglobin: 10.4 g/dL — ABNORMAL LOW (ref 11.7–15.5)
LYMPHS ABS: 980 {cells}/uL (ref 850–3900)
Lymphocytes Relative: 20 %
MCH: 33.1 pg — AB (ref 27.0–33.0)
MCHC: 32.6 g/dL (ref 32.0–36.0)
MCV: 101.6 fL — ABNORMAL HIGH (ref 80.0–100.0)
MONOS PCT: 1 %
MPV: 9.3 fL (ref 7.5–12.5)
Monocytes Absolute: 49 cells/uL — ABNORMAL LOW (ref 200–950)
Neutro Abs: 3822 cells/uL (ref 1500–7800)
Neutrophils Relative %: 78 %
PLATELETS: 240 10*3/uL (ref 140–400)
RBC: 3.14 MIL/uL — AB (ref 3.80–5.10)
RDW: 16.7 % — ABNORMAL HIGH (ref 11.0–15.0)
WBC: 4.9 10*3/uL (ref 3.8–10.8)

## 2016-08-04 NOTE — Progress Notes (Signed)
MRN : [id number]  Diana [name] is a 81 y.o. (Mar 18, 1935) female who presents with chief complaint of  Chief Complaint  Patient presents with  . Follow-up  .  History of Present Illness: The patient presents to the office for evaluation of DVT.  DVT was identified at [facility] by Duplex ultrasound.  The initial symptoms were pain and swelling in the lower extremity.  The patient notes the leg continues to be very painful with dependency and swells quite a bite.  Symptoms are much better with elevation.  The patient notes minimal edema in the morning which steadily worsens throughout the day.    The patient has not been using compression therapy at this point.  No SOB or pleuritic chest pains.  No cough or hemoptysis.  No blood per rectum or blood in any sputum.  No excessive bruising per the patient.   Current Meds  Medication Sig  . acetaminophen (TYLENOL) 325 MG tablet Take 650 mg by mouth every 4 (four) hours as needed for mild pain or fever. Reported on 02/23/2016  . ALPRAZolam (XANAX) 0.5 MG tablet Take 0.5 mg by mouth at bedtime as needed for anxiety. Reported on 04/05/2016  . Ascorbic Acid (VITAMIN C) 1000 MG tablet Take 1,000 mg by mouth daily.   . cholecalciferol (VITAMIN D) 400 UNITS TABS tablet Take 400 Units by mouth daily.   . cyanocobalamin (,VITAMIN B-12,) 1000 MCG/ML injection Inject 1 mL (1,000 mcg total) into the muscle every 30 (thirty) days. Pt uses on the 1st of every month.  . hydroxychloroquine (PLAQUENIL) 200 MG tablet Take 200 mg by mouth daily.  Marland Kitchen letrozole (FEMARA) 2.5 MG tablet Take 1 tablet (2.5 mg total) by mouth daily.  Marland Kitchen levocetirizine (XYZAL) 5 MG tablet Take 5 mg by mouth daily.  Marland Kitchen levothyroxine (SYNTHROID) 175 MCG tablet Take 1 tablet (175 mcg total) by mouth daily before breakfast.  . lisinopril (PRINIVIL,ZESTRIL) 5 MG tablet Take 1 tablet (5 mg total) by mouth daily.  Marland Kitchen loperamide (IMODIUM) 2 MG capsule Take 2-4 mg by mouth as needed for diarrhea or  loose stools. Reported on 04/05/2016  . metoCLOPramide (REGLAN) 10 MG tablet Take 1 tablet (10 mg total) by mouth 4 (four) times daily -  before meals and at bedtime.  . metoprolol succinate (TOPROL-XL) 25 MG 24 hr tablet Take 12.5 mg by mouth daily.   . mycophenolate (CELLCEPT) 500 MG tablet Take 1,000 mg by mouth 2 (two) times daily.  . Nutritional Supplements (FEEDING SUPPLEMENT, JEVITY 1.5 CAL,) LIQD Place 237 mLs into feeding tube continuous.  . ondansetron (ZOFRAN-ODT) 4 MG disintegrating tablet Take 4 mg by mouth every 4 (four) hours as needed for nausea or vomiting.  Marland Kitchen oxyCODONE-acetaminophen (PERCOCET/ROXICET) 5-325 MG tablet Take 1 tablet by mouth 2 (two) times daily as needed for severe pain. May refill on or after Aug 21 2016  . OXYGEN Inhale 2 mLs into the lungs at bedtime.  . pantoprazole sodium (PROTONIX) 40 mg/20 mL PACK Place 40 mg into feeding tube 2 (two) times daily.  . Polyvinyl Alcohol-Povidone (REFRESH OP) Place 2 drops into both eyes daily as needed (dry eyes).   . predniSONE (DELTASONE) 5 MG tablet Take 5 mg by mouth daily.   Marland Kitchen sterile water for irrigation 237 mL Give 2.5 mL/hr by tube continuous.  . sucralfate (CARAFATE) 1 G tablet Take 1 tablet (1 g total) by mouth 4 (four) times daily -  with meals and at bedtime.  . Tadalafil,  PAH, (ADCIRCA) 20 MG TABS Take 40 mg by mouth daily.  . valganciclovir (VALCYTE) 50 MG/ML SOLR Place 18 mLs (900 mg total) into feeding tube 2 (two) times daily.  . Water For Irrigation, Sterile (FREE WATER) SOLN Place 150 mLs into feeding tube 2 (two) times daily.    Past Medical History:  Diagnosis Date  . Acute glomerulonephritis with other specified pathological lesion in kidney in disease classified elsewhere(580.81)   . Alveolar aeration decreased   . Anemia   . Arthritis   . Atrial fibrillation (HCC)   . Breast cancer (HCC) 01/06/2014   positive, radiation  . Cancer (HCC)    Breast  . CHF (congestive heart failure) (HCC)   .  Diaphragmatic hernia   . DVT (deep venous thrombosis) (HCC)   . Dysrhythmia   . Environmental allergies   . Esophageal reflux   . Feeding problem    FEEDING TUBE  . Hiatal hernia   . Hyperlipidemia   . Hypertension   . Lower extremity edema   . Lupus (systemic lupus erythematosus) (HCC)   . Neuropathy (HCC)   . Osteopenia   . Oxygen deficiency    USES HS  . Peripheral neuropathy, hereditary/idiopathic   . Pneumonia   . Primary pulmonary hypertension (HCC)   . Pulmonary hypertension   . Renal insufficiency   . Sciatica   . Shingles   . Shingles (herpes zoster) polyneuropathy March 2013   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  . Shortness of breath dyspnea   . Unspecified hypothyroidism   . Unspecified menopausal and postmenopausal disorder   . Urinary incontinence without sensory awareness   . Vitamin B deficiency     Past Surgical History:  Procedure Laterality Date  . BREAST EXCISIONAL BIOPSY Left 01/06/2014   positive  . BREAST SURGERY    . CATARACT EXTRACTION W/PHACO Right 04/05/2016   Procedure: CATARACT EXTRACTION PHACO AND INTRAOCULAR LENS PLACEMENT (IOC);  Surgeon: Galen Manila, MD;  Location: [facility] ORS;  Service: Ophthalmology;  Laterality: Right;  [place] 01:20 AP% 23.9 CDE 19.29 fluid pack lot # [id number]  . CATARACT EXTRACTION W/PHACO Left 04/26/2016   Procedure: CATARACT EXTRACTION PHACO AND INTRAOCULAR LENS PLACEMENT (IOC);  Surgeon: Galen Manila, MD;  Location: [facility] ORS;  Service: Ophthalmology;  Laterality: Left;   [place] 00:59 AP% 23.2 CDE 13.83 Fluid pack lot # [id number]  . DIAGNOSTIC LAPAROSCOPY    . ESOPHAGOGASTRODUODENOSCOPY N/A 07/19/2015   Procedure: ESOPHAGOGASTRODUODENOSCOPY (EGD);  Surgeon: Wallace Cullens, MD;  Location: [facility] ENDOSCOPY;  Service: Gastroenterology;  Laterality: N/A;  . ESOPHAGOGASTRODUODENOSCOPY (EGD) WITH PROPOFOL N/A 03/28/2015   Procedure: ESOPHAGOGASTRODUODENOSCOPY (EGD) WITH PROPOFOL;  Surgeon: Earline Mayotte, MD;  Location: [facility]  ENDOSCOPY;  Service: Endoscopy;  Laterality: N/A;  . ESOPHAGOGASTRODUODENOSCOPY (EGD) WITH PROPOFOL N/A 06/10/2015   Procedure: ESOPHAGOGASTRODUODENOSCOPY (EGD) WITH PROPOFOL;  Surgeon: Christena Deem, MD;  Location: [facility] ENDOSCOPY;  Service: Endoscopy;  Laterality: N/A;  . ESOPHAGOGASTRODUODENOSCOPY (EGD) WITH PROPOFOL N/A 10/20/2015   Procedure: ESOPHAGOGASTRODUODENOSCOPY (EGD) WITH PROPOFOL;  Surgeon: Christena Deem, MD;  Location: [facility] ENDOSCOPY;  Service: Endoscopy;  Laterality: N/A;  . ESOPHAGOGASTRODUODENOSCOPY (EGD) WITH PROPOFOL N/A 02/23/2016   Procedure: ESOPHAGOGASTRODUODENOSCOPY (EGD) WITH PROPOFOL;  Surgeon: Christena Deem, MD;  Location: [facility] ENDOSCOPY;  Service: Endoscopy;  Laterality: N/A;  . ESOPHAGOGASTRODUODENOSCOPY (EGD) WITH PROPOFOL N/A 06/30/2016   Procedure: ESOPHAGOGASTRODUODENOSCOPY (EGD) WITH PROPOFOL;  Surgeon: Christena Deem, MD;  Location: [facility] ENDOSCOPY;  Service: Endoscopy;  Laterality: N/A;  . KYPHOPLASTY N/A 02/10/2015   Procedure: KYPHOPLASTY/L2;  Surgeon: Kennedy Bucker, MD;  Location: [facility] ORS;  Service: Orthopedics;  Laterality: N/A;  . LAPAROTOMY N/A 03/11/2015   Procedure: REDUCTION OF GASTRIC VOLVULUS, SPLEENECTOMY, GASTRIC TUBE PLACEMENT;  Surgeon: Earline Mayotte, MD;  Location: [facility] ORS;  Service: General;  Laterality: N/A;  . MASTECTOMY    . MOHS SURGERY    . PERIPHERAL VASCULAR CATHETERIZATION N/A 06/12/2015   Procedure: IVC Filter Insertion;  Surgeon: Annice Needy, MD;  Location: [facility] INVASIVE CV LAB;  Service: Cardiovascular;  Laterality: N/A;  . STOMACH SURGERY     for volvolus    Social History Social History  Substance Use Topics  . Smoking status: Former Smoker    Quit date: 05/25/1991  . Smokeless tobacco: Never Used  . Alcohol use Yes     Comment: rare    Family History Family History  Problem Relation Age of Onset  . Colon cancer Mother   . Alcohol abuse Sister   . Breast cancer Sister 24  . Cancer Brother   . Lung cancer  Son   No family history of bleeding/clotting disorders, porphyria or autoimmune disease   Allergies  Allergen Reactions  . Amoxicillin Other (See Comments)    Reaction:  Stomach cramps   . Cefuroxime Itching  . Contrast Media [Iodinated Diagnostic Agents] Hives  . Metrizamide Hives  . Morphine And Related     Reaction: face flushed    . Propoxyphene     Stomach cramp  . Cephalexin Rash  . Sulfa Antibiotics Rash  . Sulfonamide Derivatives Rash  . Tramadol Hcl Rash  . Venlafaxine Rash     REVIEW OF SYSTEMS (Negative unless checked)  Constitutional: [] Weight loss  [] Fever  [] Chills Cardiac: [] Chest pain   [] Chest pressure   [] Palpitations   [] Shortness of breath when laying flat   [] Shortness of breath with exertion. Vascular:  [] Pain in legs with walking   [] Pain in legs at rest  [] History of DVT   [] Phlebitis   [] Swelling in legs   [] Varicose veins   [] Non-healing ulcers Pulmonary:   [] Uses home oxygen   [] Productive cough   [] Hemoptysis   [] Wheeze  [] COPD   [] Asthma Neurologic:  [] Dizziness   [] Seizures   [] History of stroke   [] History of TIA  [] Aphasia   [] Vissual changes   [] Weakness or numbness in arm   [] Weakness or numbness in leg Musculoskeletal:   [] Joint swelling   [] Joint pain   [] Low back pain Hematologic:  [] Easy bruising  [] Easy bleeding   [] Hypercoagulable state   [] Anemic Gastrointestinal:  [] Diarrhea   [] Vomiting  [] Gastroesophageal reflux/heartburn   [] Difficulty swallowing. Genitourinary:  [] Chronic kidney disease   [] Difficult urination  [] Frequent urination   [] Blood in urine Skin:  [] Rashes   [] Ulcers  Psychological:  [] History of anxiety   []  History of major depression.  Physical Examination  Vitals:   08/04/16 1615  BP: 123/69  Pulse: 88  Resp: 16  Weight: 127 lb (57.6 kg)  Height: 5\' 2"  (1.575 m)   Body mass index is 23.23 kg/m. Gen: WD/WN, NAD Head: [place]/AT, No temporalis wasting.  Ear/Nose/Throat: Hearing grossly intact, nares w/o erythema  or drainage, poor dentition Eyes: PER, EOMI, sclera nonicteric.  Neck: Supple, no masses.  No bruit or JVD.  Pulmonary:  Good air movement, clear to auscultation bilaterally, no use of accessory muscles.  Cardiac: RRR, normal S1, S2, no Murmurs. Vascular:  2+ edema Vessel Right Left  Radial Palpable Palpable  Ulnar Palpable Palpable  Brachial Palpable  Palpable  Carotid Palpable Palpable  Femoral Palpable Palpable  Popliteal Palpable Palpable  PT Palpable Palpable  DP Palpable Palpable   Gastrointestinal: soft, non-distended. No guarding/no peritoneal signs.  Musculoskeletal: M/S 5/5 throughout.  No deformity or atrophy.  Neurologic: CN 2-12 intact. Pain and light touch intact in extremities.  Symmetrical.  Speech is fluent. Motor exam as listed above. Psychiatric: Judgment intact, Mood & affect appropriate for pt's clinical situation. Dermatologic: No rashes or ulcers noted.  No changes consistent with cellulitis. Lymph : No Cervical lymphadenopathy, no lichenification or skin changes of chronic lymphedema.  CBC Lab Results  Component Value Date   WBC 5.7 07/27/2016   HGB 9.9 (L) 07/27/2016   HCT 30.8 (L) 07/27/2016   MCV 101.7 (H) 07/27/2016   PLT 268 07/27/2016    BMET    Component Value Date/Time   NA 141 07/27/2016 1420   NA 143 03/24/2015 1212   NA 146 (H) 10/02/2014 1103   K 4.5 07/27/2016 1420   K 4.4 01/13/2015 1429   CL 103 07/27/2016 1420   CL 107 10/02/2014 1103   CO2 26 07/27/2016 1420   CO2 27 10/02/2014 1103   GLUCOSE 129 (H) 07/27/2016 1420   GLUCOSE 110 (H) 10/02/2014 1103   BUN 40 (H) 07/27/2016 1420   BUN 32 (H) 03/24/2015 1212   BUN 19 (H) 10/02/2014 1103   CREATININE 1.47 (H) 07/27/2016 1420   CALCIUM 9.2 07/27/2016 1420   CALCIUM 8.8 10/02/2014 1103   GFRNONAA 42 (L) 05/02/2016 1613   GFRAA 48 (L) 05/02/2016 1613   Estimated Creatinine Clearance: 24.5 mL/min (by C-G formula based on SCr of 1.47 mg/dL (H)).  COAG Lab Results  Component  Value Date   INR 1.22 06/10/2015   INR 1.62 06/09/2015   INR 2.05 06/08/2015    Radiology No results found.  Assessment/Plan 1. Chronic recurrent deep vein thrombosis (DVT) of right lower extremity (HCC) The patient will continue anticoagulation for now as there have not been any problems or complications at this point.  IVC filter is strongly indicated prior to high risk orthopedic surgery.  Especially given the history of PE with the past DVT.  IVC filter placement will be done the week for surgery. Risk and benefits were reviewed the patient.  Indications for the procedure were reviewed.  All questions were answered, the patient agrees to proceed.   I have had a long discussion with the patient regarding DVT and post phlebitic changes such as swelling and why it  causes symptoms such as pain.  The patient will wear graduated compression stockings class 1 (20-30 mmHg) on a daily basis a prescription was given. The patient will  beginning wearing the stockings first thing in the morning and removing them in the evening. The patient is instructed specifically not to sleep in the stockings.  In addition, behavioral modification including elevation during the day and avoidance of prolonged dependency will be initiated.    The patient will follow-up with me in 3 months after the joint replacement surgery to discuss removal (this was also discussed today and the patient agrees with the plan to have the filter removed).    2. Venous insufficiency of both lower extremities I have had a long discussion with the patient regarding swelling and why it  causes symptoms.  Patient will begin wearing graduated compression stockings class 1 (20-30 mmHg) on a daily basis a prescription was given. The patient will  beginning wearing the stockings first thing in the  morning and removing them in the evening. The patient is instructed specifically not to sleep in the stockings.   In addition, behavioral  modification will be initiated.  This will include frequent elevation, use of over the counter pain medications and exercise such as walking.  I have reviewed systemic causes for chronic edema such as liver, kidney and cardiac etiologies.  The patient denies problems with these organ systems.    Consideration for a lymph pump will also be made based upon the effectiveness of conservative therapy.  This would help to improve the edema control and prevent sequela such as ulcers and infections   Patient should undergo duplex ultrasound of the venous system to ensure that DVT or reflux is not present.  The patient will follow-up with me after the ultrasound.   3. Essential hypertension Continue antihypertensive medications as already ordered and reviewed, no changes at this time.  Continue statin as ordered and reviewed, no changes at this time    Levora Dredge, MD  08/04/2016 5:00 PM

## 2016-08-05 ENCOUNTER — Encounter (INDEPENDENT_AMBULATORY_CARE_PROVIDER_SITE_OTHER): Payer: Self-pay

## 2016-08-05 ENCOUNTER — Ambulatory Visit (INDEPENDENT_AMBULATORY_CARE_PROVIDER_SITE_OTHER): Payer: Medicare Other

## 2016-08-05 ENCOUNTER — Other Ambulatory Visit: Payer: Medicare Other

## 2016-08-05 VITALS — BP 118/70 | HR 78 | Temp 98.0°F | Resp 14 | Ht 61.0 in | Wt 128.0 lb

## 2016-08-05 DIAGNOSIS — Z0000 Encounter for general adult medical examination without abnormal findings: Secondary | ICD-10-CM | POA: Diagnosis not present

## 2016-08-05 LAB — COMPLETE METABOLIC PANEL WITH GFR
ALBUMIN: 3.9 g/dL (ref 3.6–5.1)
ALK PHOS: 50 U/L (ref 33–130)
ALT: 8 U/L (ref 6–29)
AST: 17 U/L (ref 10–35)
BILIRUBIN TOTAL: 0.3 mg/dL (ref 0.2–1.2)
BUN: 33 mg/dL — ABNORMAL HIGH (ref 7–25)
CO2: 22 mmol/L (ref 20–31)
Calcium: 9.5 mg/dL (ref 8.6–10.4)
Chloride: 106 mmol/L (ref 98–110)
Creat: 1.29 mg/dL — ABNORMAL HIGH (ref 0.60–0.93)
GFR, EST AFRICAN AMERICAN: 46 mL/min — AB (ref >=60)
GFR, Est Non African American: 39 mL/min — ABNORMAL LOW (ref >=60)
Glucose, Bld: 101 mg/dL — ABNORMAL HIGH (ref 65–99)
POTASSIUM: 4.5 mmol/L (ref 3.5–5.3)
Sodium: 142 mmol/L (ref 135–146)
TOTAL PROTEIN: 7.2 g/dL (ref 6.1–8.1)

## 2016-08-05 NOTE — Progress Notes (Signed)
Subjective:   Diana [name] is a 81 y.o. female who presents for an Initial Medicare Annual Wellness Visit.  Review of Systems    No ROS.  Medicare Wellness Visit.  Cardiac Risk Factors include: advanced age (>37men, >67 women);diabetes mellitus     Objective:    Today's Vitals   08/05/16 1508 08/05/16 1538  BP: 118/70   Pulse: 78   Resp: 14   Temp: 98 F (36.7 C)   TempSrc: Oral   SpO2: 96%   Weight: 128 lb (58.1 kg)   Height: 5\' 1"  (1.549 m)   PainSc:  5    Body mass index is 24.19 kg/m.   Current Medications (verified) Outpatient Encounter Prescriptions as of 08/05/2016  Medication Sig  . acetaminophen (TYLENOL) 325 MG tablet Take 650 mg by mouth every 4 (four) hours as needed for mild pain or fever. Reported on 02/23/2016  . ALPRAZolam (XANAX) 0.5 MG tablet Take 0.5 mg by mouth at bedtime as needed for anxiety. Reported on 04/05/2016  . Ascorbic Acid (VITAMIN C) 1000 MG tablet Take 1,000 mg by mouth daily.   . cholecalciferol (VITAMIN D) 400 UNITS TABS tablet Take 400 Units by mouth daily.   . cyanocobalamin (,VITAMIN B-12,) 1000 MCG/ML injection Inject 1 mL (1,000 mcg total) into the muscle every 30 (thirty) days. Pt uses on the 1st of every month.  . hydroxychloroquine (PLAQUENIL) 200 MG tablet Take 200 mg by mouth daily.  Marland Kitchen letrozole (FEMARA) 2.5 MG tablet Take 1 tablet (2.5 mg total) by mouth daily.  Marland Kitchen levocetirizine (XYZAL) 5 MG tablet Take 5 mg by mouth daily.  Marland Kitchen levothyroxine (SYNTHROID) 175 MCG tablet Take 1 tablet (175 mcg total) by mouth daily before breakfast.  . lisinopril (PRINIVIL,ZESTRIL) 5 MG tablet Take 1 tablet (5 mg total) by mouth daily.  Marland Kitchen loperamide (IMODIUM) 2 MG capsule Take 2-4 mg by mouth as needed for diarrhea or loose stools. Reported on 04/05/2016  . metoCLOPramide (REGLAN) 10 MG tablet Take 1 tablet (10 mg total) by mouth 4 (four) times daily -  before meals and at bedtime.  . metoprolol succinate (TOPROL-XL) 25 MG 24 hr tablet Take 12.5  mg by mouth daily.   . mycophenolate (CELLCEPT) 500 MG tablet Take 1,000 mg by mouth 2 (two) times daily.  . Nutritional Supplements (FEEDING SUPPLEMENT, JEVITY 1.5 CAL,) LIQD Place 237 mLs into feeding tube continuous.  . ondansetron (ZOFRAN-ODT) 4 MG disintegrating tablet Take 4 mg by mouth every 4 (four) hours as needed for nausea or vomiting.  Marland Kitchen oxyCODONE-acetaminophen (PERCOCET/ROXICET) 5-325 MG tablet Take 1 tablet by mouth 2 (two) times daily as needed for severe pain. May refill on or after Aug 21 2016  . OXYGEN Inhale 2 mLs into the lungs at bedtime.  . pantoprazole sodium (PROTONIX) 40 mg/20 mL PACK Place 40 mg into feeding tube 2 (two) times daily.  . Polyvinyl Alcohol-Povidone (REFRESH OP) Place 2 drops into both eyes daily as needed (dry eyes).   . predniSONE (DELTASONE) 5 MG tablet Take 5 mg by mouth daily.   Marland Kitchen sterile water for irrigation 237 mL Give 2.5 mL/hr by tube continuous.  . sucralfate (CARAFATE) 1 G tablet Take 1 tablet (1 g total) by mouth 4 (four) times daily -  with meals and at bedtime.  . Tadalafil, PAH, (ADCIRCA) 20 MG TABS Take 40 mg by mouth daily.  . valganciclovir (VALCYTE) 50 MG/ML SOLR Place 18 mLs (900 mg total) into feeding tube 2 (two) times  daily.  . Water For Irrigation, Sterile (FREE WATER) SOLN Place 150 mLs into feeding tube 2 (two) times daily.   No facility-administered encounter medications on file as of 08/05/2016.     Allergies (verified) Amoxicillin; Cefuroxime; Contrast media [iodinated diagnostic agents]; Metrizamide; Morphine and related; Propoxyphene; Cephalexin; Sulfa antibiotics; Sulfonamide derivatives; Tramadol hcl; and Venlafaxine   History: Past Medical History:  Diagnosis Date  . Acute glomerulonephritis with other specified pathological lesion in kidney in disease classified elsewhere(580.81)   . Alveolar aeration decreased   . Anemia   . Arthritis   . Atrial fibrillation ([place])   . Breast cancer ([place]) 06/03/2014   positive,  radiation  . Cancer (HCC)    Breast  . CHF (congestive heart failure) ([place])   . Diaphragmatic hernia   . DVT (deep venous thrombosis) ([place])   . Dysrhythmia   . Environmental allergies   . Esophageal reflux   . Feeding problem    FEEDING TUBE  . Hiatal hernia   . Hyperlipidemia   . Hypertension   . Lower extremity edema   . Lupus (systemic lupus erythematosus) ([place])   . Neuropathy ([place])   . Osteopenia   . Oxygen deficiency    USES HS  . Peripheral neuropathy, hereditary/idiopathic   . Pneumonia   . Primary pulmonary hypertension ([place])   . Pulmonary hypertension   . Renal insufficiency   . Sciatica   . Shingles   . Shingles (herpes zoster) polyneuropathy March 2013   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  . Shortness of breath dyspnea   . Unspecified hypothyroidism   . Unspecified menopausal and postmenopausal disorder   . Urinary incontinence without sensory awareness   . Vitamin B deficiency    Past Surgical History:  Procedure Laterality Date  . BREAST EXCISIONAL BIOPSY Left 06/03/2014   positive  . BREAST SURGERY    . CATARACT EXTRACTION W/PHACO Right 04/05/2016   Procedure: CATARACT EXTRACTION PHACO AND INTRAOCULAR LENS PLACEMENT (IOC);  Surgeon: Birder Robson, MD;  Location: [facility] ORS;  Service: Ophthalmology;  Laterality: Right;  [place] 01:20 AP% 23.9 CDE 19.29 fluid pack lot # [id number]  . CATARACT EXTRACTION W/PHACO Left 04/26/2016   Procedure: CATARACT EXTRACTION PHACO AND INTRAOCULAR LENS PLACEMENT ([place]);  Surgeon: Birder Robson, MD;  Location: [facility] ORS;  Service: Ophthalmology;  Laterality: Left;   [place] 00:59 AP% 23.2 CDE 13.83 Fluid pack lot # [id number]  . DIAGNOSTIC LAPAROSCOPY    . ESOPHAGOGASTRODUODENOSCOPY N/A 07/19/2015   Procedure: ESOPHAGOGASTRODUODENOSCOPY (EGD);  Surgeon: Hulen Luster, MD;  Location: [facility] ENDOSCOPY;  Service: Gastroenterology;  Laterality: N/A;  . ESOPHAGOGASTRODUODENOSCOPY (EGD) WITH PROPOFOL N/A 03/28/2015   Procedure:  ESOPHAGOGASTRODUODENOSCOPY (EGD) WITH PROPOFOL;  Surgeon: Robert Bellow, MD;  Location: [facility] ENDOSCOPY;  Service: Endoscopy;  Laterality: N/A;  . ESOPHAGOGASTRODUODENOSCOPY (EGD) WITH PROPOFOL N/A 06/10/2015   Procedure: ESOPHAGOGASTRODUODENOSCOPY (EGD) WITH PROPOFOL;  Surgeon: Lollie Sails, MD;  Location: [facility] ENDOSCOPY;  Service: Endoscopy;  Laterality: N/A;  . ESOPHAGOGASTRODUODENOSCOPY (EGD) WITH PROPOFOL N/A 10/20/2015   Procedure: ESOPHAGOGASTRODUODENOSCOPY (EGD) WITH PROPOFOL;  Surgeon: Lollie Sails, MD;  Location: [facility] ENDOSCOPY;  Service: Endoscopy;  Laterality: N/A;  . ESOPHAGOGASTRODUODENOSCOPY (EGD) WITH PROPOFOL N/A 02/23/2016   Procedure: ESOPHAGOGASTRODUODENOSCOPY (EGD) WITH PROPOFOL;  Surgeon: Lollie Sails, MD;  Location: [facility] ENDOSCOPY;  Service: Endoscopy;  Laterality: N/A;  . ESOPHAGOGASTRODUODENOSCOPY (EGD) WITH PROPOFOL N/A 06/30/2016   Procedure: ESOPHAGOGASTRODUODENOSCOPY (EGD) WITH PROPOFOL;  Surgeon: Lollie Sails, MD;  Location: [facility] ENDOSCOPY;  Service: Endoscopy;  Laterality: N/A;  .  KYPHOPLASTY N/A 02/10/2015   Procedure: [id number];  Surgeon: Hessie Knows, MD;  Location: [facility] ORS;  Service: Orthopedics;  Laterality: N/A;  . LAPAROTOMY N/A 03/11/2015   Procedure: REDUCTION OF GASTRIC VOLVULUS, SPLEENECTOMY, GASTRIC TUBE PLACEMENT;  Surgeon: Robert Bellow, MD;  Location: [facility] ORS;  Service: General;  Laterality: N/A;  . MASTECTOMY    . MOHS SURGERY    . PERIPHERAL VASCULAR CATHETERIZATION N/A 06/12/2015   Procedure: IVC Filter Insertion;  Surgeon: Algernon Huxley, MD;  Location: [place] [facility];  Service: Cardiovascular;  Laterality: N/A;  . STOMACH SURGERY     for volvolus   Family History  Problem Relation Age of Onset  . Colon cancer Mother   . Alcohol abuse Sister   . Breast cancer Sister 64  . Cancer Brother   . Lung cancer Son    Social History   Occupational History  . 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 Smoking status: Former Smoker    Quit date: 05/25/1991  . Smokeless tobacco: Never Used  . Alcohol use Yes     Comment: rare  . Drug use: No  . Sexual activity: Not on file    Tobacco Counseling Counseling given: Not Answered   Activities of Daily Living In your present state of health, do you have any difficulty performing the following activities: 08/05/2016 05/12/2016  Hearing? N N  Vision? N N  Difficulty concentrating or making decisions? Y N  Walking or climbing stairs? Y Y  Dressing or bathing? N N  Doing errands, shopping? Tempie Donning  Preparing Food and eating ? Y N  Using the Toilet? N N  In the past six months, have you accidently leaked urine? N N  Do you have problems with loss of bowel control? N N  Managing your Medications? Y N  Managing your Finances? N N  Housekeeping or managing your Housekeeping? Tempie Donning  Some recent data might be hidden    Immunizations and Health Maintenance Immunization History  Administered Date(s) Administered  . Influenza Split 07/04/2011, 06/29/2012, 07/03/2014  . Influenza, High Dose Seasonal PF 10/16/2013, 06/15/2016  . Influenza,inj,Quad PF,36+ Mos 08/17/2015  . Pneumococcal Conjugate-13 10/16/2013  . Pneumococcal Polysaccharide-23 07/16/2012  . Tdap 10/16/2009   Health Maintenance Due  Topic Date Due  . DEXA SCAN  12/18/2001    Patient Care Team: Crecencio Mc, MD as PCP - General (Internal Medicine) Robert Bellow, MD (General Surgery) Gregor Hams, MD as Attending Physician (Emergency Medicine) Verlin Grills, RN as [place] any recent [place] you may have received from other than [facility] providers in the past year (date may be approximate).     Assessment:   This is a routine wellness examination for [place]. The goal of the wellness visit is to assist the patient how to close the gaps in care and create a preventative care plan for the  patient.   Taking calcium VIT D as appropriate/Osteoporosis risk reviewed.  DEXA SCAN follow up with PCP.  Medications reviewed; taking without issues.Medications administered via tubal. Son assists.  Safety issues reviewed; smoke detectors in the home. No firearms in the home. Wears seatbelts when riding with others. No violence in the home.  No identified risk were noted; The patient was oriented x 3; appropriate in dress and manner and no objective failures at ADL's. Son assists as needed.  BMI; discussed  the importance of a healthy diet, water intake and exercise.  She has a healthy diet with good appetite and plans to increase her water intake.  Maintain chair range of motion exercises.  Patient Concerns: None at this time. Follow up with PCP as needed.  Hearing/Vision screen Hearing Screening Comments: Passes the whisper test Vision Screening Comments: Followed by [facility] Bilateral cataracts extracted Last OV 12/2015 Wears glasses  Dietary issues and exercise activities discussed: Current Exercise Habits: Home exercise routine (ROM Chair exercises), Time (Minutes): 10, Frequency (Times/Week): 2, Weekly Exercise (Minutes/Week): 20, Intensity: Mild  Goals    . Increase water intake      Depression Screen PHQ 2/9 Scores 08/05/2016 05/25/2016 05/12/2016 04/12/2016 03/15/2016 02/24/2016 02/02/2016  PHQ - 2 Score 0 0 0 0 0 0 0  Exception Documentation - - - - - - -    Fall Risk Fall Risk  08/05/2016 05/25/2016 05/12/2016 04/13/2016 04/12/2016  Falls in the past year? No No No No No  Number falls in past yr: - - - - -  Injury with Fall? - - - - -  Risk for fall due to : - - Impaired balance/gait;Impaired mobility - Impaired balance/gait;Impaired mobility  Risk for fall due to (comments): - - - - -    Cognitive Function:     6CIT Screen 08/05/2016  What Year? 0 points  What month? 0 points  What time? 0 points  Count back from 20 0 points  Months in reverse 0  points    Screening Tests Health Maintenance  Topic Date Due  . DEXA SCAN  12/18/2001  . ZOSTAVAX  08/04/2017 (Originally 12/18/1996)  . HEMOGLOBIN A1C  11/09/2016  . OPHTHALMOLOGY EXAM  12/21/2016  . FOOT EXAM  05/03/2017  . TETANUS/TDAP  10/17/2019  . INFLUENZA VACCINE  Completed  . PNA vac Low Risk Adult  Completed      Plan:    End of life planning; Advance aging; Advanced directives discussed. Copy of current HCPOA/Living Will requested.  Medicare Attestation I have personally reviewed: The patient's medical and social history Their use of alcohol, tobacco or illicit drugs Their current medications and supplements The patient's functional ability including ADLs,fall risks, home safety risks, cognitive, and hearing and visual impairment Diet and physical activities Evidence for depression   The patient's weight, height, BMI, and visual acuity have been recorded in the chart.  I have made referrals and provided education to the patient based on review of the above and I have provided the patient with a written personalized care plan for preventive services.    During the course of the visit, [place] was educated and counseled about the following appropriate screening and preventive services:   Vaccines to include Pneumoccal, Influenza, Hepatitis B, Td, Zostavax, HCV  Electrocardiogram  Cardiovascular disease screening  Colorectal cancer screening  Bone density screening  Diabetes screening  Glaucoma screening  Mammography/PAP  Nutrition counseling  Smoking cessation counseling  Patient Instructions (the written plan) were given to the patient.    Varney Biles, LPN   [id number]

## 2016-08-05 NOTE — Patient Instructions (Addendum)
  Diana [name] , Thank you for taking time to come for your Medicare Wellness Visit. I appreciate your ongoing commitment to your health goals. Please review the following plan we discussed and let me know if I can assist you in the future.   Follow up with Dr. Derrel Nip as needed.  These are the goals we discussed: Goals    . Increase water intake       This is a list of the screening recommended for you and due dates:  Health Maintenance  Topic Date Due  . DEXA scan (bone density measurement)  12/18/2001  . Shingles Vaccine  08/04/2017*  . Hemoglobin A1C  11/09/2016  . Eye exam for diabetics  12/21/2016  . Complete foot exam   05/03/2017  . Tetanus Vaccine  10/17/2019  . Flu Shot  Completed  . Pneumonia vaccines  Completed  *Topic was postponed. The date shown is not the original due date.

## 2016-08-07 NOTE — Progress Notes (Signed)
  I have reviewed the above information and agree with above.   Teresa Tullo, MD 

## 2016-08-08 ENCOUNTER — Telehealth: Payer: Self-pay

## 2016-08-08 DIAGNOSIS — B259 Cytomegaloviral disease, unspecified: Secondary | ICD-10-CM

## 2016-08-08 DIAGNOSIS — B258 Other cytomegaloviral diseases: Principal | ICD-10-CM

## 2016-08-08 DIAGNOSIS — K208 Other esophagitis: Principal | ICD-10-CM

## 2016-08-08 MED ORDER — VALGANCICLOVIR HCL 50 MG/ML PO SOLR: 450.0000 mg | Freq: Every day | ORAL | 1 refills | Status: DC

## 2016-08-08 NOTE — Telephone Encounter (Signed)
Called patient with results of most recent CMET and CBC. I discussed that her hemoglobin was stable and renal function improved from 11/8 but was not completely back to baseline. I advised the patient to continue her valganciclovir 450 mg daily. She stated that she is almost out of medication and I sent in a new prescription to her pharmacy. She has an appointment to follow up with Dr. Linus Salmons on 11/29.  Plan: - Valganciclovir 450 mg daily - Follow up appointment with Dr. Linus Salmons 11/29  Dimitri Ped, PharmD. PGY-2 Infectious Diseases Pharmacy Resident Pager: [phone] 08/08/2016, 11:42 AM

## 2016-08-09 ENCOUNTER — Other Ambulatory Visit: Payer: Self-pay | Admitting: *Deleted

## 2016-08-09 DIAGNOSIS — C50412 Malignant neoplasm of upper-outer quadrant of left female breast: Secondary | ICD-10-CM

## 2016-08-10 ENCOUNTER — Ambulatory Visit: Payer: Medicare Other | Admitting: Family Medicine

## 2016-08-10 ENCOUNTER — Inpatient Hospital Stay: Payer: Medicare Other | Attending: Internal Medicine

## 2016-08-10 ENCOUNTER — Inpatient Hospital Stay: Payer: Medicare Other | Admitting: Internal Medicine

## 2016-08-10 VITALS — BP 133/77 | HR 79 | Temp 99.4°F | Resp 18 | Wt 129.2 lb

## 2016-08-10 DIAGNOSIS — Z87891 Personal history of nicotine dependence: Secondary | ICD-10-CM | POA: Insufficient documentation

## 2016-08-10 DIAGNOSIS — Z79811 Long term (current) use of aromatase inhibitors: Secondary | ICD-10-CM | POA: Insufficient documentation

## 2016-08-10 DIAGNOSIS — Z9081 Acquired absence of spleen: Secondary | ICD-10-CM

## 2016-08-10 DIAGNOSIS — Z853 Personal history of malignant neoplasm of breast: Secondary | ICD-10-CM | POA: Insufficient documentation

## 2016-08-10 DIAGNOSIS — M3500 Sicca syndrome, unspecified: Secondary | ICD-10-CM

## 2016-08-10 DIAGNOSIS — C50412 Malignant neoplasm of upper-outer quadrant of left female breast: Secondary | ICD-10-CM

## 2016-08-10 DIAGNOSIS — I82403 Acute embolism and thrombosis of unspecified deep veins of lower extremity, bilateral: Secondary | ICD-10-CM

## 2016-08-10 LAB — COMPREHENSIVE METABOLIC PANEL
ALBUMIN: 4.1 g/dL (ref 3.5–5.0)
ALT: 12 U/L — AB (ref 14–54)
AST: 24 U/L (ref 15–41)
Alkaline Phosphatase: 52 U/L (ref 38–126)
Anion gap: 10 (ref 5–15)
BILIRUBIN TOTAL: 0.4 mg/dL (ref 0.3–1.2)
BUN: 39 mg/dL — AB (ref 6–20)
CHLORIDE: 103 mmol/L (ref 101–111)
CO2: 25 mmol/L (ref 22–32)
CREATININE: 1.2 mg/dL — AB (ref 0.44–1.00)
Calcium: 9.2 mg/dL (ref 8.9–10.3)
GFR calc Af Amer: 48 mL/min — ABNORMAL LOW (ref >60)
GFR, EST NON AFRICAN AMERICAN: 42 mL/min — AB (ref >60)
GLUCOSE: 124 mg/dL — AB (ref 65–99)
POTASSIUM: 4.2 mmol/L (ref 3.5–5.1)
Sodium: 138 mmol/L (ref 135–145)
TOTAL PROTEIN: 7.4 g/dL (ref 6.5–8.1)

## 2016-08-10 LAB — CBC WITH DIFFERENTIAL/PLATELET
BASOS ABS: 0.1 10*3/uL (ref 0–0.1)
BASOS PCT: 1 %
Eosinophils Absolute: 0 10*3/uL (ref 0–0.7)
Eosinophils Relative: 0 %
HEMATOCRIT: 31 % — AB (ref 35.0–47.0)
Hemoglobin: 10.5 g/dL — ABNORMAL LOW (ref 12.0–16.0)
LYMPHS PCT: 12 %
Lymphs Abs: 0.8 10*3/uL — ABNORMAL LOW (ref 1.0–3.6)
MCH: 34.9 pg — ABNORMAL HIGH (ref 26.0–34.0)
MCHC: 33.8 g/dL (ref 32.0–36.0)
MCV: 103.2 fL — AB (ref 80.0–100.0)
Monocytes Absolute: 0.4 10*3/uL (ref 0.2–0.9)
Monocytes Relative: 7 %
NEUTROS ABS: 4.9 10*3/uL (ref 1.4–6.5)
Neutrophils Relative %: 80 %
Platelets: 318 10*3/uL (ref 150–440)
RBC: 3.01 MIL/uL — AB (ref 3.80–5.20)
RDW: 16.9 % — AB (ref 11.5–14.5)
WBC: 6.2 10*3/uL (ref 3.6–11.0)

## 2016-08-10 MED ORDER — TAMOXIFEN CITRATE 20 MG PO TABS: 20.0000 mg | ORAL_TABLET | Freq: Every day | ORAL | 3 refills | Status: DC

## 2016-08-10 MED ORDER — LETROZOLE 2.5 MG PO TABS: 2.5000 mg | ORAL_TABLET | Freq: Every day | ORAL | 3 refills | Status: DC

## 2016-08-10 NOTE — Progress Notes (Unsigned)
Complains of occasional nausea and chronic back pain that is relieved with percocet.

## 2016-08-10 NOTE — Progress Notes (Unsigned)
Per Dr. [place], tamoxifen mistakenly escribed to pharmacy. Pt needs refills on letrozole. [facility] esc[facility]. [facility] at [facility] to [facility].

## 2016-08-16 ENCOUNTER — Other Ambulatory Visit (INDEPENDENT_AMBULATORY_CARE_PROVIDER_SITE_OTHER): Payer: Self-pay | Admitting: Vascular Surgery

## 2016-08-16 DIAGNOSIS — K2210 Ulcer of esophagus without bleeding: Secondary | ICD-10-CM | POA: Diagnosis not present

## 2016-08-16 DIAGNOSIS — R110 Nausea: Secondary | ICD-10-CM | POA: Diagnosis not present

## 2016-08-16 DIAGNOSIS — B259 Cytomegaloviral disease, unspecified: Secondary | ICD-10-CM | POA: Diagnosis not present

## 2016-08-17 ENCOUNTER — Ambulatory Visit (INDEPENDENT_AMBULATORY_CARE_PROVIDER_SITE_OTHER): Payer: Medicare Other | Admitting: Internal Medicine

## 2016-08-17 ENCOUNTER — Other Ambulatory Visit: Payer: Self-pay | Admitting: Internal Medicine

## 2016-08-17 ENCOUNTER — Encounter: Payer: Self-pay | Admitting: Internal Medicine

## 2016-08-17 VITALS — BP 135/74 | HR 79 | Temp 97.8°F | Wt 130.0 lb

## 2016-08-17 DIAGNOSIS — M3214 Glomerular disease in systemic lupus erythematosus: Secondary | ICD-10-CM

## 2016-08-17 DIAGNOSIS — B258 Other cytomegaloviral diseases: Secondary | ICD-10-CM

## 2016-08-17 DIAGNOSIS — Z7952 Long term (current) use of systemic steroids: Secondary | ICD-10-CM | POA: Insufficient documentation

## 2016-08-17 DIAGNOSIS — Z5181 Encounter for therapeutic drug level monitoring: Secondary | ICD-10-CM | POA: Diagnosis not present

## 2016-08-17 DIAGNOSIS — K208 Other esophagitis without bleeding: Secondary | ICD-10-CM

## 2016-08-17 LAB — CBC WITH DIFFERENTIAL/PLATELET
BASOS PCT: 1 %
Basophils Absolute: 61 cells/uL (ref 0–200)
EOS ABS: 0 {cells}/uL — AB (ref 15–500)
EOS PCT: 0 %
HCT: 31.7 % — ABNORMAL LOW (ref 35.0–45.0)
Hemoglobin: 10.4 g/dL — ABNORMAL LOW (ref 11.7–15.5)
Lymphocytes Relative: 16 %
Lymphs Abs: 976 cells/uL (ref 850–3900)
MCH: 33.9 pg — ABNORMAL HIGH (ref 27.0–33.0)
MCHC: 32.8 g/dL (ref 32.0–36.0)
MCV: 103.3 fL — ABNORMAL HIGH (ref 80.0–100.0)
MONOS PCT: 3 %
MPV: 9.4 fL (ref 7.5–12.5)
Monocytes Absolute: 183 cells/uL — ABNORMAL LOW (ref 200–950)
NEUTROS ABS: 4880 {cells}/uL (ref 1500–7800)
Neutrophils Relative %: 80 %
PLATELETS: 283 10*3/uL (ref 140–400)
RBC: 3.07 MIL/uL — AB (ref 3.80–5.10)
RDW: 16.5 % — ABNORMAL HIGH (ref 11.0–15.0)
WBC: 6.1 10*3/uL (ref 3.8–10.8)

## 2016-08-17 NOTE — Assessment & Plan Note (Signed)
Will check creat today

## 2016-08-17 NOTE — Progress Notes (Signed)
[facility]      Follow up for possible CMV esophagitis.     Patient ID: Diana [name], female    DOB: [birth year], 81 y.o.   MRN: [id number]  HPI:   History:  In July 2016 she developed a gastric volvulus that did not improve with conservative management and underwent surgical management with adhesions and decompression of the stomach with partial reduction of massive hiatal hernia with incidental splenectomy.  She then developed significant issues with dysphagia and nause and upper endoscopy done noted esophagitis. She then required NGT feedings and thn PEG tube placement.  She has had three different endoscopies noting esophagitis without improvement and on one on pathology CMV viral cytopathic effect with IHC stain confirmation.      Has been on prednisone for many years.   I put her on 3 weeks of valganciclovir and she improved clinically. She now is eating on her own, though still has some nausea.  She had a repeat EGD by Dr. Marva Panda and did not improvement, though some inflammation remained and after discussing with Dr. Marva Panda, I put her on valganciclovir again.  She now has been on it about 5-6 weeks.  She had some decrease in her creat and was dose adjusted to 450 mg bid.  She is tolerating it except for nausea but that has been improving.   No leukopenia.     Past Medical History:  Diagnosis Date  . Acute glomerulonephritis with other specified pathological lesion in kidney in disease classified elsewhere(580.81)   . Alveolar aeration decreased   . Anemia   . Arthritis   . Atrial fibrillation (HCC)   . Breast cancer (HCC) 06/03/2014   positive, radiation  . Cancer (HCC)    Breast  . CHF (congestive heart failure) (HCC)   . Diaphragmatic hernia   . DVT (deep venous thrombosis) (HCC)   . Dysrhythmia   . Environmental allergies   . Esophageal reflux   . Feeding problem    FEEDING TUBE  . Hiatal hernia   . Hyperlipidemia   . Hypertension   .  Lower extremity edema   . Lupus (systemic lupus erythematosus) (HCC)   . Neuropathy (HCC)   . Osteopenia   . Oxygen deficiency    USES HS  . Peripheral neuropathy, hereditary/idiopathic   . Pneumonia   . Primary pulmonary hypertension (HCC)   . Pulmonary hypertension   . Renal insufficiency   . Sciatica   . Shingles   . Shingles (herpes zoster) polyneuropathy March 2013   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  . Shortness of breath dyspnea   . Unspecified hypothyroidism   . Unspecified menopausal and postmenopausal disorder   . Urinary incontinence without sensory awareness   . Vitamin B deficiency     Prior to Admission medications   Medication Sig Start Date End Date Taking? Authorizing Provider  acetaminophen (TYLENOL) 325 MG tablet Take 650 mg by mouth every 4 (four) hours as needed for mild pain or fever. Reported on 02/23/2016   Yes Historical Provider, MD  ALPRAZolam Prudy Feeler) 0.5 MG tablet Take 0.5 mg by mouth at bedtime as needed for anxiety. Reported on 04/05/2016   Yes Historical Provider, MD  Ascorbic Acid (VITAMIN C) 1000 MG tablet Take 1,000 mg by mouth daily.    Yes Historical Provider, MD  cholecalciferol (VITAMIN D) 400 UNITS TABS tablet Take 400 Units by mouth daily.    Yes Historical Provider, MD  cyanocobalamin (,VITAMIN B-12,) 1000  MCG/ML injection Inject 1 mL (1,000 mcg total) into the muscle every 30 (thirty) days. Pt uses on the 1st of every month. 09/29/15  Yes Sherlene Shams, MD  hydroxychloroquine (PLAQUENIL) 200 MG tablet Take 200 mg by mouth daily.   Yes Historical Provider, MD  letrozole (FEMARA) 2.5 MG tablet Take 1 tablet (2.5 mg total) by mouth daily. 02/04/16  Yes Loann Quill, NP  levocetirizine (XYZAL) 5 MG tablet Take 5 mg by mouth daily.   Yes Historical Provider, MD  levothyroxine (SYNTHROID) 175 MCG tablet Take 1 tablet (175 mcg total) by mouth daily before breakfast. 04/01/16  Yes Sherlene Shams, MD  loperamide (IMODIUM) 2 MG capsule Take 2-4 mg by  mouth as needed for diarrhea or loose stools. Reported on 04/05/2016   Yes Historical Provider, MD  metoCLOPramide (REGLAN) 10 MG tablet Take 1 tablet (10 mg total) by mouth 4 (four) times daily -  before meals and at bedtime. 09/29/15  Yes Sherlene Shams, MD  metoprolol succinate (TOPROL-XL) 25 MG 24 hr tablet Take 12.5 mg by mouth daily.    Yes Historical Provider, MD  mycophenolate (CELLCEPT) 500 MG tablet Take 1,000 mg by mouth 2 (two) times daily.   Yes Historical Provider, MD  Nutritional Supplements (FEEDING SUPPLEMENT, JEVITY 1.5 CAL,) LIQD Place 237 mLs into feeding tube continuous.   Yes Historical Provider, MD  ondansetron (ZOFRAN-ODT) 4 MG disintegrating tablet Take 4 mg by mouth every 4 (four) hours as needed for nausea or vomiting.   Yes Historical Provider, MD  oxyCODONE-acetaminophen (PERCOCET/ROXICET) 5-325 MG tablet Take 1 tablet by mouth every 8 (eight) hours as needed for severe pain. 03/28/16  Yes Sherlene Shams, MD  OXYGEN Inhale 2 mLs into the lungs at bedtime.   Yes Historical Provider, MD  pantoprazole sodium (PROTONIX) 40 mg/20 mL PACK Place 40 mg into feeding tube 2 (two) times daily.   Yes Historical Provider, MD  Polyvinyl Alcohol-Povidone (REFRESH OP) Place 2 drops into both eyes daily as needed (dry eyes).    Yes Historical Provider, MD  predniSONE (DELTASONE) 5 MG tablet Take 5 mg by mouth daily.    Yes Historical Provider, MD  sterile water for irrigation 237 mL Give 2.5 mL/hr by tube continuous.   Yes Historical Provider, MD  sucralfate (CARAFATE) 1 G tablet Take 1 tablet (1 g total) by mouth 4 (four) times daily -  with meals and at bedtime. 07/20/15  Yes Sital Mody, MD  Tadalafil, PAH, (ADCIRCA) 20 MG TABS Take 40 mg by mouth daily.   Yes Historical Provider, MD  Water For Irrigation, Sterile (FREE WATER) SOLN Place 150 mLs into feeding tube 2 (two) times daily. 06/12/15  Yes Enid Baas, MD  valganciclovir (VALCYTE) 50 MG/ML SOLR Place 18 mLs (900 mg total) into  feeding tube 2 (two) times daily. 05/02/16   Gardiner Barefoot, MD    Review of Systems  Constitutional: negative for fevers and weight loss Respiratory: negative for cough Gastrointestinal: negative for diarrhea All other systems reviewed and are negative   Constitutional: in no apparent distress  Vitals:   08/17/16 1503  BP: 135/74  Pulse: 79  Temp: 97.8 F (36.6 C)   EYES: anicteric Cardiovascular: Cor Tachy Respiratory: CTA B; normal respiratory effort GI: Bowel sounds are normal, liver is not enlarged, spleen is not enlarged Musculoskeletal: no pedal edema noted Skin: negatives: no rash  Labs: Lab Results  Component Value Date   WBC 6.2 08/10/2016   HGB 10.5 (  L) 08/10/2016   HCT 31.0 (L) 08/10/2016   MCV 103.2 (H) 08/10/2016   PLT 318 08/10/2016    Lab Results  Component Value Date   CREATININE 1.20 (H) 08/10/2016   BUN 39 (H) 08/10/2016   NA 138 08/10/2016   K 4.2 08/10/2016   CL 103 08/10/2016   CO2 25 08/10/2016    Lab Results  Component Value Date   ALT 12 (L) 08/10/2016   AST 24 08/10/2016   ALKPHOS 52 08/10/2016   BILITOT 0.4 08/10/2016   INR 1.22 06/10/2015

## 2016-08-17 NOTE — Assessment & Plan Note (Signed)
Seems to be improving. I will check the CMV in the blood and have her continue for a second month, which she is on now and stop at the end.   In a brief literature review, nothing to indicated benefit of continuing suppressive therapy.

## 2016-08-17 NOTE — Assessment & Plan Note (Signed)
Continues on steroids

## 2016-08-18 ENCOUNTER — Telehealth: Payer: Self-pay | Admitting: *Deleted

## 2016-08-18 LAB — COMPREHENSIVE METABOLIC PANEL
ALBUMIN: 3.9 g/dL (ref 3.6–5.1)
ALT: 8 U/L (ref 6–29)
AST: 17 U/L (ref 10–35)
Alkaline Phosphatase: 64 U/L (ref 33–130)
BILIRUBIN TOTAL: 0.3 mg/dL (ref 0.2–1.2)
BUN: 36 mg/dL — ABNORMAL HIGH (ref 7–25)
CALCIUM: 9.5 mg/dL (ref 8.6–10.4)
CO2: 25 mmol/L (ref 20–31)
Chloride: 104 mmol/L (ref 98–110)
Creat: 1.01 mg/dL — ABNORMAL HIGH (ref 0.60–0.93)
Glucose, Bld: 105 mg/dL — ABNORMAL HIGH (ref 65–99)
Potassium: 4.6 mmol/L (ref 3.5–5.3)
Sodium: 138 mmol/L (ref 135–146)
Total Protein: 7 g/dL (ref 6.1–8.1)

## 2016-08-18 NOTE — Telephone Encounter (Signed)
-----   Message from Thayer Headings, MD sent at 08/17/2016  5:19 PM EST ----- Can her CMV PCR qualitative be changed to quantitative? thanks

## 2016-08-18 NOTE — Telephone Encounter (Signed)
Santiago Glad will contact [facility] to see if this is possible. Landis Gandy, RN

## 2016-08-19 LAB — CYTOMEGALOVIRUS PCR, QUALITATIVE: CMV DNA QL PCR: NOT DETECTED

## 2016-08-20 LAB — CMV (CYTOMEGALOVIRUS) DNA ULTRAQUANT, PCR: CMV DNA, QN Real Time PCR: <200 IU/mL (ref <200)

## 2016-08-21 DIAGNOSIS — M3500 Sicca syndrome, unspecified: Secondary | ICD-10-CM | POA: Insufficient documentation

## 2016-08-21 DIAGNOSIS — Z9081 Acquired absence of spleen: Secondary | ICD-10-CM | POA: Insufficient documentation

## 2016-08-21 NOTE — Assessment & Plan Note (Signed)
S/p splenectomy at laprotomy in 2016

## 2016-08-21 NOTE — Progress Notes (Signed)
[facility]  Telephone:([phone]) [phone]  Fax:([phone]) [phone]     ADDILYNNE SIRIGNANO DOB: Mar 31, 1935  MR#: [id number]  AOZ#:[id number]  Patient Care Team: Sherlene Shams, MD as PCP - General (Internal Medicine) Earline Mayotte, MD (General Surgery) Darci Current, MD as Attending Physician (Emergency Medicine) Luella Cook, RN as [facility] Care Management  CHIEF COMPLAINT:  Chief Complaint  Patient presents with  . Follow-up    INTERVAL HISTORY:  Ms. Goodemote returns for f/u for breast cancer. She had been on Letrozole since 08/2014. She has ocasional hot flashes but otherwise no sigificant side effcts. She suffers from chronic mid back pain due to vertebral compression fracture. She has a long standing history of lupus and is on chronic steroid therapy She also had a gastric volvulus requring laparotomy and splenectomy in 03/2015,  She also has a H/o CMV esophagitis, that is treated and in remission, follows with ID regularly.She required PEG tube for chronic dysphagia, although she is now eating by mouth  She denies having felt any new lumps in either breast .    Her most recent mammogram was performed in January 2017 and reported as BI-RADS 2, benign.   REVIEW OF SYSTEMS:   Review of Systems  Constitutional: Negative.  Negative for chills, diaphoresis, fever, malaise/fatigue and weight loss.  HENT: Negative.   Eyes: Negative.   Respiratory: Negative for cough, hemoptysis, sputum production, shortness of breath and wheezing.   Cardiovascular: Negative for chest pain, palpitations, orthopnea, claudication, leg swelling and PND.  Gastrointestinal: Positive for nausea. Negative for abdominal pain, blood in stool, constipation, diarrhea, heartburn, melena and vomiting.  Genitourinary: Negative.   Musculoskeletal: Positive for back pain.       Chronic back pain, followed by Dr. Pernell Dupre  Skin: Negative.   Neurological: Negative.  Negative for dizziness,  tingling, focal weakness, seizures and weakness.  Endo/Heme/Allergies: Negative.  Does not bruise/bleed easily.  Psychiatric/Behavioral: Negative.  Negative for depression. The patient is not nervous/anxious and does not have insomnia.   All other systems reviewed and are negative.   As per HPI. Otherwise, a complete review of systems is negatve.  ONCOLOGY HISTORY: Oncology History   81 year old female with stage I (T1 C. N0 M0) invasive mammary carcinoma status post wide local excision and sentinel node biopsy tumor is ER/PR positive HER-2/neu not over expressed with a low Oncotype DX score of 17 for adjuvant whole breast radiation Oncotype DX score is 17 Patient has finished radiation therapy in December of 2015 Started letrozole from December, 2015     Breast cancer of upper-outer quadrant of left female breast (HCC)   05/26/2014 Initial Diagnosis    Breast cancer of upper-outer quadrant of left female breast (HCC), stage I, T1 cN0 M0, ER/PR positive HER-2/neu not overexpressed.      06/06/2014 Oncotype testing    Oncotype DX score 17       - 09/05/2014 Radiation Therapy         09/05/2014 -  Anti-estrogen oral therapy    Started letrozole       PAST MEDICAL HISTORY: Past Medical History:  Diagnosis Date  . Acute glomerulonephritis with other specified pathological lesion in kidney in disease classified elsewhere(580.81)   . Alveolar aeration decreased   . Anemia   . Arthritis   . Atrial fibrillation (HCC)   . Breast cancer (HCC) 07/10/2014   positive, radiation  . Cancer (HCC)    Breast  . CHF (congestive heart failure) (  HCC)   . Diaphragmatic hernia   . DVT (deep venous thrombosis) (HCC)   . Dysrhythmia   . Environmental allergies   . Esophageal reflux   . Feeding problem    FEEDING TUBE  . Hiatal hernia   . Hyperlipidemia   . Hypertension   . Lower extremity edema   . Lupus (systemic lupus erythematosus) (HCC)   . Neuropathy (HCC)   . Osteopenia   .  Oxygen deficiency    USES HS  . Peripheral neuropathy, hereditary/idiopathic   . Pneumonia   . Primary pulmonary hypertension (HCC)   . Pulmonary hypertension   . Renal insufficiency   . Sciatica   . Shingles   . Shingles (herpes zoster) polyneuropathy March 2013   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  . Shortness of breath dyspnea   . Unspecified hypothyroidism   . Unspecified menopausal and postmenopausal disorder   . Urinary incontinence without sensory awareness   . Vitamin B deficiency     PAST SURGICAL HISTORY: Past Surgical History:  Procedure Laterality Date  . BREAST EXCISIONAL BIOPSY Left 05-12-202015   positive  . BREAST SURGERY    . CATARACT EXTRACTION W/PHACO Right 04/05/2016   Procedure: CATARACT EXTRACTION PHACO AND INTRAOCULAR LENS PLACEMENT (IOC);  Surgeon: Galen Manila, MD;  Location: [facility] ORS;  Service: Ophthalmology;  Laterality: Right;  [place] 01:20 AP% 23.9 CDE 19.29 fluid pack lot # [id number]  . CATARACT EXTRACTION W/PHACO Left 04/26/2016   Procedure: CATARACT EXTRACTION PHACO AND INTRAOCULAR LENS PLACEMENT (IOC);  Surgeon: Galen Manila, MD;  Location: [facility] ORS;  Service: Ophthalmology;  Laterality: Left;   [place] 00:59 AP% 23.2 CDE 13.83 Fluid pack lot # [id number]  . DIAGNOSTIC LAPAROSCOPY    . ESOPHAGOGASTRODUODENOSCOPY N/A 07/19/2015   Procedure: ESOPHAGOGASTRODUODENOSCOPY (EGD);  Surgeon: Wallace Cullens, MD;  Location: [facility] ENDOSCOPY;  Service: Gastroenterology;  Laterality: N/A;  . ESOPHAGOGASTRODUODENOSCOPY (EGD) WITH PROPOFOL N/A 03/28/2015   Procedure: ESOPHAGOGASTRODUODENOSCOPY (EGD) WITH PROPOFOL;  Surgeon: Earline Mayotte, MD;  Location: [facility] ENDOSCOPY;  Service: Endoscopy;  Laterality: N/A;  . ESOPHAGOGASTRODUODENOSCOPY (EGD) WITH PROPOFOL N/A 06/10/2015   Procedure: ESOPHAGOGASTRODUODENOSCOPY (EGD) WITH PROPOFOL;  Surgeon: Christena Deem, MD;  Location[facility] ENDOSCOPY;  Service: Endoscopy;  Laterality: N/A;  . ESOPHAGOGASTRODUODENOSCOPY (EGD) WITH  PROPOFOL N/A 10/20/2015   Procedure: ESOPHAGOGASTRODUODENOSCOPY (EGD) WITH PROPOFOL;  Surgeon: Christena Deem, MD;  Location: [facility] ENDOSCOPY;  Service: Endoscopy;  Laterality: N/A;  . ESOPHAGOGASTRODUODENOSCOPY (EGD) WITH PROPOFOL N/A 02/23/2016   Procedure: ESOPHAGOGASTRODUODENOSCOPY (EGD) WITH PROPOFOL;  Surgeon: Christena Deem, MD;  Location: [facility] ENDOSCOPY;  Service: Endoscopy;  Laterality: N/A;  . ESOPHAGOGASTRODUODENOSCOPY (EGD) WITH PROPOFOL N/A 06/30/2016   Procedure: ESOPHAGOGASTRODUODENOSCOPY (EGD) WITH PROPOFOL;  Surgeon: Christena Deem, MD;  Location: [facility] END[facility]COPY;  Service: Endoscopy;  Laterality: N/A;  . KYPHOPLASTY N/A 02/10/2015   Procedure: FIEPPIRJJOA/C1;  Surgeon: Kennedy Bucker, MD;  Location: [facility] ORS;  Service: Orthopedics;  Laterality: N/A;  . LAPAROTOMY N/A 03/11/2015   Procedure: REDUCTION OF GASTRIC VOLVULUS, SPLEENECTOMY, GASTRIC TUBE PLACEMENT;  Surgeon: Earline Mayotte, MD;  Location: [facility] ORS;  Service: General;  Laterality: N/A;  . MASTECTOMY    . MOHS SURGERY    . PERIPHERAL VASCULAR CATHETERIZATION N/A 06/12/2015   Procedure: IVC Filter Insertion;  Surgeon: Annice Needy, MD;  Location: [facility] INVASIVE CV LAB;  Service: Cardiovascular;  Laterality: N/A;  . STOMACH SURGERY     for volvolus    FAMILY HISTORY Family History  Problem Relation Age of Onset  . Colon cancer  Mother   . Alcohol abuse Sister   . Breast cancer Sister 19  . Cancer Brother   . Lung cancer Son     GYNECOLOGIC HISTORY:  No LMP recorded. Patient is postmenopausal.     ADVANCED DIRECTIVES:    HEALTH MAINTENANCE: Social History  Substance Use Topics  . Smoking status: Former Smoker    Quit date: 05/25/1991  . Smokeless tobacco: Never Used  . Alcohol use Yes     Comment: rare      Mammogram:January 2017   Allergies  Allergen Reactions  . Amoxicillin Other (See Comments)    Reaction:  Stomach cramps   . Cefuroxime Itching  . Contrast Media [Iodinated Diagnostic Agents]  Hives  . Metrizamide Hives  . Morphine And Related     Reaction: face flushed    . Propoxyphene     Stomach cramp  . Cephalexin Rash  . Sulfa Antibiotics Rash  . Sulfonamide Derivatives Rash  . Tramadol Hcl Rash  . Venlafaxine Rash    Current Outpatient Prescriptions  Medication Sig Dispense Refill  . acetaminophen (TYLENOL) 325 MG tablet Take 650 mg by mouth every 4 (four) hours as needed for mild pain or fever. Reported on 02/23/2016    . ALPRAZolam (XANAX) 0.5 MG tablet Take 0.5 mg by mouth at bedtime as needed for anxiety. Reported on 04/05/2016    . cyanocobalamin (,VITAMIN B-12,) 1000 MCG/ML injection Inject 1 mL (1,000 mcg total) into the muscle every 30 (thirty) days. Pt uses on the 1st of every month. 10 mL 1  . hydroxychloroquine (PLAQUENIL) 200 MG tablet Take 200 mg by mouth daily.    Marland Kitchen letrozole (FEMARA) 2.5 MG tablet Take 1 tablet (2.5 mg total) by mouth daily. (Patient taking differently: Take 5 mg by mouth daily. ) 90 tablet 3  . levocetirizine (XYZAL) 5 MG tablet Take 5 mg by mouth every evening.     Marland Kitchen levothyroxine (SYNTHROID) 175 MCG tablet Take 1 tablet (175 mcg total) by mouth daily before breakfast. 90 tablet 1  . lisinopril (PRINIVIL,ZESTRIL) 5 MG tablet Take 1 tablet (5 mg total) by mouth daily. 90 tablet 3  . loperamide (IMODIUM) 2 MG capsule Take 2-4 mg by mouth as needed for diarrhea or loose stools. Reported on 04/05/2016    . metoCLOPramide (REGLAN) 10 MG tablet Take 1 tablet (10 mg total) by mouth 4 (four) times daily -  before meals and at bedtime. 360 tablet 1  . metoprolol succinate (TOPROL-XL) 25 MG 24 hr tablet Take 12.5 mg by mouth daily.     . mycophenolate (CELLCEPT) 500 MG tablet Take 1,000 mg by mouth 2 (two) times daily.    . Nutritional Supplements (FEEDING SUPPLEMENT, JEVITY 1.5 CAL,) LIQD Place 237 mLs into feeding tube continuous.    . ondansetron (ZOFRAN-ODT) 4 MG disintegrating tablet Take 4 mg by mouth every 4 (four) hours as needed for nausea  or vomiting.    Marland Kitchen oxyCODONE-acetaminophen (PERCOCET/ROXICET) 5-325 MG tablet Take 1 tablet by mouth 2 (two) times daily as needed for severe pain. May refill on or after Aug 21 2016 60 tablet 0  . OXYGEN Inhale 2 mLs into the lungs at bedtime.    . Polyvinyl Alcohol-Povidone (REFRESH OP) Place 2 drops into both eyes daily as needed (dry eyes).     . predniSONE (DELTASONE) 5 MG tablet Take 5 mg by mouth daily.     Marland Kitchen sterile water for irrigation 237 mL Give 2.5 mL/hr by tube continuous.    Marland Kitchen  Tadalafil, PAH, (ADCIRCA) 20 MG TABS Take 40 mg by mouth daily.    . valganciclovir (VALCYTE) 50 MG/ML SOLR Take 9 mLs (450 mg total) by mouth daily. (Patient taking differently: Take 450 mg by mouth 2 (two) times daily. ) 270 mL 1  . Water For Irrigation, Sterile (FREE WATER) SOLN Place 150 mLs into feeding tube 2 (two) times daily. 1500 mL 2  . Calcium Carbonate-Vitamin D 600-400 MG-UNIT tablet Take 1 tablet by mouth daily.    . pantoprazole (PROTONIX) 40 MG tablet Take 40 mg by mouth 2 (two) times daily.    . sucralfate (CARAFATE) 1 GM/10ML suspension Take 1 g by mouth 2 (two) times daily.     No current facility-administered medications for this visit.     OBJECTIVE: BP 133/77 (BP Location: Right Arm, Patient Position: Sitting)   Pulse 79   Temp 99.4 F (37.4 C) (Tympanic)   Resp 18   Wt 129 lb 3 oz (58.6 kg)   BMI 24.41 kg/m    Body mass index is 24.41 kg/m.    ECOG FS:1 - Symptomatic but completely ambulatory Physical Exam  Constitutional: She is oriented to person, place, and time. She appears well-developed and well-nourished.  HENT:  Head: Normocephalic and atraumatic.  Eyes: EOM are normal. Pupils are equal, round, and reactive to light.  Neck: Normal range of motion.  Cardiovascular: Normal rate and regular rhythm.   Pulmonary/Chest: Effort normal. No respiratory distress.  Abdominal: Soft.  Musculoskeletal: Normal range of motion. She exhibits no edema.  Lymphadenopathy:    She has  no cervical adenopathy.  Neurological: She is alert and oriented to person, place, and time.  Skin: Skin is warm. No rash noted. No erythema.  Psychiatric: She has a normal mood and affect. Her behavior is normal.  Nursing note and vitals reviewed.   Right breast shows no palpable lumps, no concerning skin or nipple irregularites. Left breast shows no palpable lumps, no concerning skin or nipple irregularites. Well healed lumpectomy scar and induration noted in the  left breast Right axilla is free of any palpable lumps Left axillla is free of any palpable lumps  No adenopathy was felt in the supraclavicular or cervical regions  Upper extremities are without edema.   Cbc today shows hgb 10.5, mcv 103 cmp shows creatine 1.2, gfr 48    ASSESSMENT:  Carcinoma of left breast, stage I a, T1 cN0 M0, ER/PR positive HER-2/neu not overexpressed.   PLAN:    1. Left breast cancer. Patient is status post wide local excision and sentinel node biopsy as well as completion of whole breast radiation in 08/2014.   Her most recent mammogram in January 2017 was reported as BI-RADS 2, negative.  Clinically there is no evidence of recurrent or progressive disease.  We will schedule a bilateral mammogram fro Jan 2018.   She has had a vertebral compression fracture most likely due to osteoporrosis,. She is  Also on long term prednisone. This in addition to letrozole will significantly increase her risk of another osteoporotic fracture.  Therefore I urged her to consider injection Prolia q 6 months. I also recommend to switch to Tamoxifen, but she also gives a H/o DVT, therefore I think Tamoxifen will also be risky for her.  I gave the information regarding Prolia, she will think about it and get back to [place] if she wants to get started, She is not a candidate for bisphosphonate therapy due to creatinine and dysphagia.  She will return  in 6 months fo f/u  For now she will continue calcium  and  Vitamin D and Letrozole.  Fall precautinns have been advised, avoid lifting any weights over 4 lbs.  Anemia of CKD, hgb is baove 10, no intervention at this time.  She has multiple medical conditions such as  SLE/SJOGRENS , erosive CMV esophagitis, with chronic reflux pulmonary HTN and CKD that are stable and being followed by specialists at [facility] and [facility].  We'll continue with routine follow-up in approximately 6 months.  She knows to contact us for any new problems in the interim.    Towana Badger, MD   08/21/2016 1:46 PM

## 2016-08-22 ENCOUNTER — Telehealth: Payer: Self-pay | Admitting: Internal Medicine

## 2016-08-22 MED ORDER — WARFARIN SODIUM 5 MG PO TABS: 5.0000 mg | ORAL_TABLET | Freq: Every day | ORAL | 3 refills | Status: DC

## 2016-08-22 NOTE — Telephone Encounter (Signed)
Patient notified and voiced understanding of coumadin regimen. INR scheduled as directed.

## 2016-08-22 NOTE — Telephone Encounter (Signed)
Message unclear  From nonclinical staff . please call patietn

## 2016-08-22 NOTE — Telephone Encounter (Signed)
IVC filter close to the abdominal aorta is being taken out tomorrow  That was why they stopped her coumadin since she had IVC she just found out tomorrow Dr. Delana Meyer is removing so will she need to restart coumadin. Dr. Delana Meyer advised her to contact PCP to restart coumadin to prevent DVT.

## 2016-08-22 NOTE — Telephone Encounter (Signed)
We will start her coumadin on Wednesday starting with 10 mg the first night , 5 mg daily after that and check an INR next Monday

## 2016-08-22 NOTE — Telephone Encounter (Signed)
Pt called about the IVC filter will be removed and pt wants to know if she needs to go back on coumidin? Please advise?  Call pt@ [phone]. Thank you!

## 2016-08-23 ENCOUNTER — Ambulatory Visit
Admission: RE | Admit: 2016-08-23 | Discharge: 2016-08-23 | Disposition: A | Discharge status: Home / Self Care | Payer: Medicare Other | Source: Ambulatory Visit | Attending: Vascular Surgery | Admitting: Vascular Surgery

## 2016-08-23 ENCOUNTER — Encounter
Admission: RE | Disposition: A | Discharge status: Home / Self Care | Payer: Self-pay | Source: Ambulatory Visit | Attending: Vascular Surgery

## 2016-08-23 ENCOUNTER — Other Ambulatory Visit: Payer: Self-pay | Admitting: Internal Medicine

## 2016-08-23 ENCOUNTER — Telehealth: Payer: Self-pay | Admitting: *Deleted

## 2016-08-23 DIAGNOSIS — M8580 Other specified disorders of bone density and structure, unspecified site: Secondary | ICD-10-CM | POA: Insufficient documentation

## 2016-08-23 DIAGNOSIS — Z881 Allergy status to other antibiotic agents status: Secondary | ICD-10-CM | POA: Insufficient documentation

## 2016-08-23 DIAGNOSIS — G629 Polyneuropathy, unspecified: Secondary | ICD-10-CM | POA: Insufficient documentation

## 2016-08-23 DIAGNOSIS — Z882 Allergy status to sulfonamides status: Secondary | ICD-10-CM | POA: Insufficient documentation

## 2016-08-23 DIAGNOSIS — Z452 Encounter for adjustment and management of vascular access device: Secondary | ICD-10-CM | POA: Insufficient documentation

## 2016-08-23 DIAGNOSIS — Z8619 Personal history of other infectious and parasitic diseases: Secondary | ICD-10-CM | POA: Diagnosis not present

## 2016-08-23 DIAGNOSIS — Z801 Family history of malignant neoplasm of trachea, bronchus and lung: Secondary | ICD-10-CM | POA: Insufficient documentation

## 2016-08-23 DIAGNOSIS — Z91041 Radiographic dye allergy status: Secondary | ICD-10-CM | POA: Insufficient documentation

## 2016-08-23 DIAGNOSIS — Z888 Allergy status to other drugs, medicaments and biological substances status: Secondary | ICD-10-CM | POA: Insufficient documentation

## 2016-08-23 DIAGNOSIS — Z923 Personal history of irradiation: Secondary | ICD-10-CM | POA: Insufficient documentation

## 2016-08-23 DIAGNOSIS — I872 Venous insufficiency (chronic) (peripheral): Secondary | ICD-10-CM | POA: Diagnosis not present

## 2016-08-23 DIAGNOSIS — I509 Heart failure, unspecified: Secondary | ICD-10-CM | POA: Insufficient documentation

## 2016-08-23 DIAGNOSIS — E559 Vitamin D deficiency, unspecified: Secondary | ICD-10-CM | POA: Diagnosis not present

## 2016-08-23 DIAGNOSIS — Z9841 Cataract extraction status, right eye: Secondary | ICD-10-CM | POA: Diagnosis not present

## 2016-08-23 DIAGNOSIS — Z9010 Acquired absence of unspecified breast and nipple: Secondary | ICD-10-CM | POA: Insufficient documentation

## 2016-08-23 DIAGNOSIS — Z9842 Cataract extraction status, left eye: Secondary | ICD-10-CM | POA: Diagnosis not present

## 2016-08-23 DIAGNOSIS — Z9889 Other specified postprocedural states: Secondary | ICD-10-CM | POA: Diagnosis not present

## 2016-08-23 DIAGNOSIS — I825Z1 Chronic embolism and thrombosis of unspecified deep veins of right distal lower extremity: Secondary | ICD-10-CM | POA: Diagnosis not present

## 2016-08-23 DIAGNOSIS — Z87891 Personal history of nicotine dependence: Secondary | ICD-10-CM | POA: Diagnosis not present

## 2016-08-23 DIAGNOSIS — B259 Cytomegaloviral disease, unspecified: Secondary | ICD-10-CM

## 2016-08-23 DIAGNOSIS — L930 Discoid lupus erythematosus: Secondary | ICD-10-CM | POA: Insufficient documentation

## 2016-08-23 DIAGNOSIS — Z880 Allergy status to penicillin: Secondary | ICD-10-CM | POA: Insufficient documentation

## 2016-08-23 DIAGNOSIS — K219 Gastro-esophageal reflux disease without esophagitis: Secondary | ICD-10-CM | POA: Insufficient documentation

## 2016-08-23 DIAGNOSIS — B258 Other cytomegaloviral diseases: Principal | ICD-10-CM

## 2016-08-23 DIAGNOSIS — I110 Hypertensive heart disease with heart failure: Secondary | ICD-10-CM | POA: Diagnosis not present

## 2016-08-23 DIAGNOSIS — I4891 Unspecified atrial fibrillation: Secondary | ICD-10-CM | POA: Diagnosis not present

## 2016-08-23 DIAGNOSIS — E785 Hyperlipidemia, unspecified: Secondary | ICD-10-CM | POA: Insufficient documentation

## 2016-08-23 DIAGNOSIS — Z803 Family history of malignant neoplasm of breast: Secondary | ICD-10-CM | POA: Insufficient documentation

## 2016-08-23 DIAGNOSIS — Z885 Allergy status to narcotic agent status: Secondary | ICD-10-CM | POA: Diagnosis not present

## 2016-08-23 DIAGNOSIS — D509 Iron deficiency anemia, unspecified: Secondary | ICD-10-CM | POA: Insufficient documentation

## 2016-08-23 DIAGNOSIS — I82409 Acute embolism and thrombosis of unspecified deep veins of unspecified lower extremity: Secondary | ICD-10-CM | POA: Diagnosis not present

## 2016-08-23 DIAGNOSIS — Z853 Personal history of malignant neoplasm of breast: Secondary | ICD-10-CM | POA: Insufficient documentation

## 2016-08-23 DIAGNOSIS — Z800 Family history of malignant neoplasm of digestive organs: Secondary | ICD-10-CM | POA: Insufficient documentation

## 2016-08-23 DIAGNOSIS — K208 Other esophagitis: Principal | ICD-10-CM

## 2016-08-23 HISTORY — PX: PERIPHERAL VASCULAR CATHETERIZATION: SHX172C

## 2016-08-23 SURGERY — IVC FILTER REMOVAL
Anesthesia: Moderate Sedation

## 2016-08-23 MED ORDER — METHYLPREDNISOLONE SODIUM SUCC 125 MG IJ SOLR
125.0000 mg | Freq: Once | INTRAMUSCULAR | Status: AC
  Administered 2016-08-23: 125 mg via INTRAVENOUS

## 2016-08-23 MED ORDER — SODIUM CHLORIDE 0.9 % IV SOLN
INTRAVENOUS | Status: DC
  Administered 2016-08-23: 08:00:00 via INTRAVENOUS

## 2016-08-23 MED ORDER — HEPARIN SODIUM (PORCINE) 1000 UNIT/ML IJ SOLN
INTRAMUSCULAR | Status: AC
  Filled 2016-08-23: qty 1

## 2016-08-23 MED ORDER — HEPARIN SODIUM (PORCINE) 1000 UNIT/ML IJ SOLN
INTRAMUSCULAR | Status: DC | PRN
  Administered 2016-08-23: 3000 [IU] via INTRAVENOUS

## 2016-08-23 MED ORDER — MIDAZOLAM HCL 5 MG/5ML IJ SOLN
INTRAMUSCULAR | Status: AC
Start: 2016-08-23 — End: 2016-08-23
  Filled 2016-08-23: qty 5

## 2016-08-23 MED ORDER — IOPAMIDOL (ISOVUE-300) INJECTION 61%
INTRAVENOUS | Status: DC | PRN
  Administered 2016-08-23: 15 mL via INTRAVENOUS

## 2016-08-23 MED ORDER — FAMOTIDINE 20 MG PO TABS
40.0000 mg | ORAL_TABLET | Freq: Once | ORAL | Status: AC
  Administered 2016-08-23: 40 mg via ORAL

## 2016-08-23 MED ORDER — MIDAZOLAM HCL 2 MG/2ML IJ SOLN
INTRAMUSCULAR | Status: DC | PRN
  Administered 2016-08-23 (×2): 2 mg via INTRAVENOUS
  Administered 2016-08-23: 1 mg via INTRAVENOUS

## 2016-08-23 MED ORDER — FENTANYL CITRATE (PF) 100 MCG/2ML IJ SOLN
INTRAMUSCULAR | Status: AC
  Filled 2016-08-23: qty 2

## 2016-08-23 MED ORDER — CLINDAMYCIN PHOSPHATE 300 MG/50ML IV SOLN: 300.0000 mg | Freq: Once | INTRAVENOUS | Status: DC

## 2016-08-23 MED ORDER — LIDOCAINE HCL (PF) 1 % IJ SOLN
INTRAMUSCULAR | Status: AC
  Filled 2016-08-23: qty 30

## 2016-08-23 MED ORDER — METHYLPREDNISOLONE SODIUM SUCC 125 MG IJ SOLR
INTRAMUSCULAR | Status: AC
  Administered 2016-08-23: 125 mg via INTRAVENOUS
  Filled 2016-08-23: qty 2

## 2016-08-23 MED ORDER — FAMOTIDINE 20 MG PO TABS
ORAL_TABLET | ORAL | Status: AC
  Administered 2016-08-23: 40 mg via ORAL
  Filled 2016-08-23: qty 2

## 2016-08-23 MED ORDER — FENTANYL CITRATE (PF) 100 MCG/2ML IJ SOLN
INTRAMUSCULAR | Status: DC | PRN
  Administered 2016-08-23 (×2): 50 ug via INTRAVENOUS

## 2016-08-23 SURGICAL SUPPLY — 5 items
NEEDLE ENTRY 21GA 7CM ECHOTIP (NEEDLE) ×2 IMPLANT
PACK ANGIOGRAPHY (CUSTOM PROCEDURE TRAY) ×2 IMPLANT
SET INTRO CAPELLA COAXIAL (SET/KITS/TRAYS/PACK) ×2 IMPLANT
SET VENACAVA FILTER RETRIEVAL (MISCELLANEOUS) ×2 IMPLANT
WIRE J 3MM .035X145CM (WIRE) ×2 IMPLANT

## 2016-08-23 NOTE — Telephone Encounter (Deleted)
Patient notified and lab appointment scheduled for next Thursday 09/01/16. Myrtis Hopping

## 2016-08-23 NOTE — H&P (Signed)
[facility] History & Physical Update  The patient was interviewed and re-examined.  The patient's previous History and Physical has been reviewed and is unchanged.  There is no change in the plan of care. We plan to proceed with the scheduled procedure.  Hortencia Pilar, MD  08/23/2016, 7:57 AM

## 2016-08-23 NOTE — Discharge Instructions (Signed)

## 2016-08-23 NOTE — Op Note (Signed)
  OPERATIVE NOTE   PRE-OPERATIVE DIAGNOSIS: DVT with PE  POST-OPERATIVE DIAGNOSIS: Same  PROCEDURE: 1. Retrieval of IVC Filter 2. Inferior Vena Cavagram  SURGEON: Katha Cabal, M.D.  ANESTHESIA:  Conscious sedation was administered under my direct supervision. IV Versed plus fentanyl were utilized. Continuous ECG, pulse oximetry and blood pressure was monitored throughout the entire procedure. Conscious sedation was for a total of 25 minutes.  ESTIMATED BLOOD LOSS: Minimal cc  FINDING(S):inferior vena cava is widely patent filter is in place in good position. Filter is removed without incident  SPECIMEN(S):  IVC filter intact  INDICATIONS:   Diana [name] is a 81 y.o. female who presents with DVT and PE. The patient has now tolerated anticoagulation for several months. Therefore, the IVC filter is recommended to be removed. The risks and benefits were reviewed with the patient all questions were answered and they agreed to proceed with IVC filter retrieval. Oral anticoagulation will be continued.  DESCRIPTION: After obtaining full informed written consent, the patient was brought back to the Special Procedure Suite and placed in the supine position.  The patient received IV antibiotics prior to induction.  After obtaining adequate sedation, the patient was prepped and draped in the standard fashion and appropriate time out is called.     Ultrasound was placed in a sterile sleeve.The right neck was then imaged with ultrasound.   Jugular vein was identified it is echolucent and homogeneous indicating patency. 1% lidocaine is then infiltrated under ultrasound visualization and subsequently a Seldinger needle is inserted under real-time ultrasound guidance.  J-wire is then advanced into the inferior vena cava under fluoroscopic guidance. With the tip of the sheath at the confluence of the iliac veins inferior vena caval imaging is performed.  This image revealed a small amount of  thrombus near the apex of the filter. After measuring this represented 20-25% of the volume of the filter well below the threshold of 50% that is the mark as to whether to leave or remove the filter.  After review of the image the sheath is repositioned to above the filter and the snares introduced. Snares opened and the hook is secured without difficulty. The filter is then collapsed within the sheath and removed without difficulty.  Sheath is removed by pressures held the patient tolerated the procedure well and there were no immediate complications.  Interpretation: inferior vena cava is widely patent filter is in place in good position. Filter is removed without incident.     COMPLICATIONS: None  CONDITION: [name], M.D. [facility] Office: [phone]   08/23/2016, 9:25 AM

## 2016-08-23 NOTE — Progress Notes (Signed)
Pt here today for ivc filter removal, alert and oriented, vitals stable, denies complaints, will monitor vitals as well as etco2 during and throughout entire procedure,

## 2016-08-23 NOTE — Progress Notes (Signed)
MD here to speak with patient and son.

## 2016-08-23 NOTE — Telephone Encounter (Deleted)
-----   Message from Thayer Headings, MD sent at 08/23/2016  9:24 AM EST ----- Her recent labs from the office visit look good.  I would like to recheck her labs next week, by the end of the week, to be sure.  She then will be finishing the valacyclovir soon after that and will stop when her current bottle finishes.  She can get her labs at her PCP if ok with Dr. Derrel Nip.   thanks

## 2016-08-23 NOTE — Telephone Encounter (Signed)
-----   Message from Thayer Headings, MD sent at 08/23/2016  9:24 AM EST ----- Her recent labs from the office visit look good.  I would like to recheck her labs next week, by the end of the week, to be sure.  She then will be finishing the valacyclovir soon after that and will stop when her current bottle finishes.  She can get her labs at her PCP if ok with Dr. Derrel Nip.   thanks

## 2016-08-23 NOTE — Telephone Encounter (Signed)
Patient notified and lab appt scheduled for Thursday 09/01/16. Myrtis Hopping

## 2016-08-23 NOTE — Progress Notes (Signed)
Procedure done, pt vitals stable throughout entire procedure, will transfer to recovery for further monitoring prior to discharge.

## 2016-08-24 ENCOUNTER — Encounter: Payer: Self-pay | Admitting: Vascular Surgery

## 2016-08-26 ENCOUNTER — Other Ambulatory Visit: Payer: Self-pay

## 2016-08-26 DIAGNOSIS — Z7901 Long term (current) use of anticoagulants: Principal | ICD-10-CM

## 2016-08-26 DIAGNOSIS — Z5181 Encounter for therapeutic drug level monitoring: Secondary | ICD-10-CM

## 2016-08-29 ENCOUNTER — Other Ambulatory Visit (INDEPENDENT_AMBULATORY_CARE_PROVIDER_SITE_OTHER): Payer: Medicare Other

## 2016-08-29 DIAGNOSIS — Z5181 Encounter for therapeutic drug level monitoring: Secondary | ICD-10-CM | POA: Diagnosis not present

## 2016-08-29 DIAGNOSIS — Z7901 Long term (current) use of anticoagulants: Secondary | ICD-10-CM

## 2016-08-29 LAB — PROTIME-INR
INR: 2.9 ratio — ABNORMAL HIGH (ref 0.8–1.0)
Prothrombin Time: 31.7 s — ABNORMAL HIGH (ref 9.6–13.1)

## 2016-09-01 ENCOUNTER — Other Ambulatory Visit: Payer: Medicare Other

## 2016-09-05 ENCOUNTER — Ambulatory Visit (INDEPENDENT_AMBULATORY_CARE_PROVIDER_SITE_OTHER): Payer: Medicare Other | Admitting: Internal Medicine

## 2016-09-05 ENCOUNTER — Other Ambulatory Visit: Payer: Self-pay

## 2016-09-05 DIAGNOSIS — K208 Other esophagitis: Secondary | ICD-10-CM | POA: Diagnosis not present

## 2016-09-05 DIAGNOSIS — E440 Moderate protein-calorie malnutrition: Secondary | ICD-10-CM

## 2016-09-05 DIAGNOSIS — M4856XS Collapsed vertebra, not elsewhere classified, lumbar region, sequela of fracture: Secondary | ICD-10-CM

## 2016-09-05 DIAGNOSIS — D631 Anemia in chronic kidney disease: Secondary | ICD-10-CM | POA: Diagnosis not present

## 2016-09-05 DIAGNOSIS — N189 Chronic kidney disease, unspecified: Secondary | ICD-10-CM | POA: Diagnosis not present

## 2016-09-05 DIAGNOSIS — Z7901 Long term (current) use of anticoagulants: Secondary | ICD-10-CM

## 2016-09-05 DIAGNOSIS — Z5181 Encounter for therapeutic drug level monitoring: Secondary | ICD-10-CM

## 2016-09-05 DIAGNOSIS — B258 Other cytomegaloviral diseases: Secondary | ICD-10-CM | POA: Diagnosis not present

## 2016-09-05 DIAGNOSIS — E119 Type 2 diabetes mellitus without complications: Secondary | ICD-10-CM

## 2016-09-05 DIAGNOSIS — D518 Other vitamin B12 deficiency anemias: Secondary | ICD-10-CM

## 2016-09-05 DIAGNOSIS — B9789 Other viral agents as the cause of diseases classified elsewhere: Secondary | ICD-10-CM

## 2016-09-05 DIAGNOSIS — J069 Acute upper respiratory infection, unspecified: Secondary | ICD-10-CM

## 2016-09-05 DIAGNOSIS — K2210 Ulcer of esophagus without bleeding: Secondary | ICD-10-CM

## 2016-09-05 DIAGNOSIS — B259 Cytomegaloviral disease, unspecified: Secondary | ICD-10-CM

## 2016-09-05 LAB — CBC WITH DIFFERENTIAL/PLATELET
Basophils Absolute: 0.1 10*3/uL (ref 0.0–0.1)
Basophils Relative: 0.9 % (ref 0.0–3.0)
Eosinophils Absolute: 0 10*3/uL (ref 0.0–0.7)
Eosinophils Relative: 0.1 % (ref 0.0–5.0)
HCT: 32.6 % — ABNORMAL LOW (ref 36.0–46.0)
Hemoglobin: 10.5 g/dL — ABNORMAL LOW (ref 12.0–15.0)
Lymphocytes Relative: 18.4 % (ref 12.0–46.0)
Lymphs Abs: 1.3 10*3/uL (ref 0.7–4.0)
MCHC: 32.2 g/dL (ref 30.0–36.0)
MCV: 105.2 fl — ABNORMAL HIGH (ref 78.0–100.0)
Monocytes Absolute: 0.5 10*3/uL (ref 0.1–1.0)
Monocytes Relative: 6.7 % (ref 3.0–12.0)
Neutro Abs: 5.1 10*3/uL (ref 1.4–7.7)
Neutrophils Relative %: 73.9 % (ref 43.0–77.0)
Platelets: 400 10*3/uL (ref 150.0–400.0)
RBC: 3.1 Mil/uL — ABNORMAL LOW (ref 3.87–5.11)
RDW: 16.4 % — ABNORMAL HIGH (ref 11.5–15.5)
WBC: 6.9 10*3/uL (ref 4.0–10.5)

## 2016-09-05 LAB — BASIC METABOLIC PANEL
BUN: 32 mg/dL — ABNORMAL HIGH (ref 6–23)
CO2: 27 mEq/L (ref 19–32)
Calcium: 9.5 mg/dL (ref 8.4–10.5)
Chloride: 105 mEq/L (ref 96–112)
Creatinine, Ser: 1.19 mg/dL (ref 0.40–1.20)
GFR: 46.42 mL/min — ABNORMAL LOW (ref >60.00)
Glucose, Bld: 113 mg/dL — ABNORMAL HIGH (ref 70–99)
Potassium: 4.5 mEq/L (ref 3.5–5.1)
Sodium: 142 mEq/L (ref 135–145)

## 2016-09-05 LAB — PROTIME-INR
INR: 3 ratio — ABNORMAL HIGH (ref 0.8–1.0)
Prothrombin Time: 32.2 s — ABNORMAL HIGH (ref 9.6–13.1)

## 2016-09-05 MED ORDER — OXYCODONE-ACETAMINOPHEN 5-325 MG PO TABS: 1.0000 | ORAL_TABLET | Freq: Two times a day (BID) | ORAL | 0 refills | Status: DC | PRN

## 2016-09-05 MED ORDER — CYANOCOBALAMIN 1000 MCG/ML IJ SOLN
1000.0000 ug | Freq: Once | INTRAMUSCULAR | Status: AC
  Administered 2016-09-05: 1000 ug via INTRAMUSCULAR

## 2016-09-05 NOTE — Addendum Note (Signed)
Addended by: Frutoso Chase A on: 09/05/2016 03:01 PM   Modules accepted: Orders

## 2016-09-05 NOTE — Patient Instructions (Signed)
I have refilled your percocet for 3 months,  You will run out in April 3 so make your follow up appointment before then  You can schedule weekly b12 injections for the next few weeks on Wednesdays  to get your B12 level back up.

## 2016-09-05 NOTE — Progress Notes (Signed)
Subjective:  Patient ID: Diana [name], female    DOB: [birth year]  Age: 81 y.o. MRN: [id number]  CC: Diagnoses of Esophagitis, CMV (HCC), Monitoring for anticoagulant use, Anemia in chronic kidney disease, unspecified CKD stage, ANEMIA, B12 DEFICIENCY, Diabetes mellitus without complication (HCC), Erosive esophagitis, Viral URI, Moderate protein-calorie malnutrition (HCC), and Non-traumatic compression fracture of second lumbar vertebra, sequela were pertinent to this visit.  HPI Diana [name] presents for follow up on multiple issues.  Frontal sinuses have been congested and she has had some mild conjunctivitis  Symptoms present for one week,  Blood specked nasal drainage and sputum in th morning only.   Had ID eval for management of biopsy proven  CMV esophagitis . Treated,  Repeat EGD to be done in February .  Was nauseated constantly on the valgancyclovir, finished treatment last week.  Finally able to take some food by mouth.  Drinking chocolate premier protein shake daily,  Soup with vegetables .   Not taking iron by mouth,  Iron infusion has occurred in quite a while. , Has not had b12 either,  Recent cbc noted macrocytic anemia.   taking vit d and c.    Right knee replacement advised by Hyacinth Meeker but deferred .  She is in constant pain  And has a morton's neuroma on left foot, which was treated in the past by troxler but each  injection is $500. Back pan current;y controlled with  2 percocet daily   History of VTE.  IVC filter has been removed.  Now on warfarin; has been taking 5 mg coumadin daily, her last dose yesterday    Needs wednesday lab visits          Lab Results  Component Value Date   HGBA1C 6.2 05/09/2016   Lab Results  Component Value Date   INR 3.0 (H) 09/05/2016   INR 2.9 (H) 08/29/2016   INR 1.22 06/10/2015   Lab Results  Component Value Date   ALT 8 08/17/2016   AST 17 08/17/2016   ALKPHOS 64 08/17/2016   BILITOT 0.3 08/17/2016   Lab Results    Component Value Date   CREATININE 1.19 09/05/2016   Lab Results  Component Value Date   WBC 6.9 09/05/2016   HGB 10.5 (L) 09/05/2016   HCT 32.6 (L) 09/05/2016   MCV 105.2 Repeated and verified X2. (H) 09/05/2016   PLT 400.0 09/05/2016   Lab Results  Component Value Date   IRON 47 09/29/2015   TIBC 206 (L) 09/29/2015   FERRITIN 332.0 (H) 09/29/2015   Lab Results  Component Value Date   FERRITIN 332.0 (H) 09/29/2015    Outpatient Medications Prior to Visit  Medication Sig Dispense Refill  . acetaminophen (TYLENOL) 325 MG tablet Take 650 mg by mouth every 4 (four) hours as needed for mild pain or fever. Reported on 02/23/2016    . ALPRAZolam (XANAX) 0.5 MG tablet Take 0.5 mg by mouth at bedtime as needed for anxiety. Reported on 04/05/2016    . Calcium Carbonate-Vitamin D 600-400 MG-UNIT tablet Take 1 tablet by mouth daily.    . cyanocobalamin (,VITAMIN B-12,) 1000 MCG/ML injection Inject 1 mL (1,000 mcg total) into the muscle every 30 (thirty) days. Pt uses on the 1st of every month. 10 mL 1  . hydroxychloroquine (PLAQUENIL) 200 MG tablet Take 200 mg by mouth daily.    Marland Kitchen letrozole (FEMARA) 2.5 MG tablet Take 1 tablet (2.5 mg total) by mouth daily. (Patient taking differently: Take 5  mg by mouth daily. ) 90 tablet 3  . levocetirizine (XYZAL) 5 MG tablet Take 5 mg by mouth every evening.     Marland Kitchen levothyroxine (SYNTHROID) 175 MCG tablet Take 1 tablet (175 mcg total) by mouth daily before breakfast. 90 tablet 1  . lisinopril (PRINIVIL,ZESTRIL) 5 MG tablet Take 1 tablet (5 mg total) by mouth daily. 90 tablet 3  . loperamide (IMODIUM) 2 MG capsule Take 2-4 mg by mouth as needed for diarrhea or loose stools. Reported on 04/05/2016    . metoCLOPramide (REGLAN) 10 MG tablet Take 1 tablet (10 mg total) by mouth 4 (four) times daily -  before meals and at bedtime. 360 tablet 1  . metoprolol succinate (TOPROL-XL) 25 MG 24 hr tablet Take 12.5 mg by mouth daily.     . mycophenolate (CELLCEPT) 500 MG  tablet Take 1,000 mg by mouth 2 (two) times daily.    . Nutritional Supplements (FEEDING SUPPLEMENT, JEVITY 1.5 CAL,) LIQD Place 237 mLs into feeding tube continuous.    . ondansetron (ZOFRAN-ODT) 4 MG disintegrating tablet Take 4 mg by mouth every 4 (four) hours as needed for nausea or vomiting.    . OXYGEN Inhale 2 mLs into the lungs at bedtime.    . pantoprazole (PROTONIX) 40 MG tablet Take 40 mg by mouth 2 (two) times daily.    . Polyvinyl Alcohol-Povidone (REFRESH OP) Place 2 drops into both eyes daily as needed (dry eyes).     . predniSONE (DELTASONE) 5 MG tablet Take 5 mg by mouth daily.     Marland Kitchen sterile water for irrigation 237 mL Give 2.5 mL/hr by tube continuous.    . sucralfate (CARAFATE) 1 GM/10ML suspension Take 1 g by mouth 2 (two) times daily.    . Tadalafil, PAH, (ADCIRCA) 20 MG TABS Take 40 mg by mouth daily.    Marland Kitchen warfarin (COUMADIN) 5 MG tablet Take 1 tablet (5 mg total) by mouth daily. 30 tablet 3  . Water For Irrigation, Sterile (FREE WATER) SOLN Place 150 mLs into feeding tube 2 (two) times daily. 1500 mL 2  . oxyCODONE-acetaminophen (PERCOCET/ROXICET) 5-325 MG tablet Take 1 tablet by mouth 2 (two) times daily as needed for severe pain. May refill on or after Aug 21 2016 60 tablet 0  . valganciclovir (VALCYTE) 50 MG/ML SOLR Take 9 mLs (450 mg total) by mouth daily. (Patient not taking: Reported on 09/05/2016) 270 mL 1   No facility-administered medications prior to visit.     Review of Systems;  Patient denies headache, fevers, malaise, unintentional weight loss, skin rash, eye pain, sinus congestion and sinus pain, sore throat, dysphagia,  hemoptysis , cough, dyspnea, wheezing, chest pain, palpitations, orthopnea, edema, abdominal pain, nausea, melena, diarrhea, constipation, flank pain, dysuria, hematuria, urinary  Frequency, nocturia, numbness, tingling, seizures,  Focal weakness, Loss of consciousness,  Tremor, insomnia, depression, anxiety, and suicidal ideation.       Objective:  BP 128/66   Pulse 78   Temp 98 F (36.7 C)   Resp 14   Ht 5\' 2"  (1.575 m)   Wt 129 lb 8 oz (58.7 kg)   SpO2 98%   BMI 23.69 kg/m   BP Readings from Last 3 Encounters:  09/05/16 128/66  08/23/16 123/75  08/17/16 135/74    Wt Readings from Last 3 Encounters:  09/05/16 129 lb 8 oz (58.7 kg)  08/23/16 130 lb (59 kg)  08/17/16 130 lb (59 kg)    General appearance: alert, cooperative and appears stated  age Ears: normal TM's and external ear canals both ears Throat: lips, mucosa, and tongue normal; teeth and gums normal Neck: no adenopathy, no carotid bruit, supple, symmetrical, trachea midline and thyroid not enlarged, symmetric, no tenderness/mass/nodules Back: symmetric, no curvature. ROM normal. No CVA tenderness. Lungs: clear to auscultation bilaterally Heart: regular rate and rhythm, S1, S2 normal, no murmur, click, rub or gallop Abdomen: soft, non-tender; bowel sounds normal; no masses,  no organomegaly Pulses: 2+ and symmetric Skin: Skin color, texture, turgor normal. No rashes or lesions Lymph nodes: Cervical, supraclavicular, and axillary nodes normal.  Lab Results  Component Value Date   HGBA1C 6.2 05/09/2016   HGBA1C 5.9 07/03/2015   HGBA1C 7.1 (H) 11/19/2014    Lab Results  Component Value Date   CREATININE 1.19 09/05/2016   CREATININE 1.01 (H) 08/17/2016   CREATININE 1.20 (H) 08/10/2016    Lab Results  Component Value Date   WBC 6.9 09/05/2016   HGB 10.5 (L) 09/05/2016   HCT 32.6 (L) 09/05/2016   PLT 400.0 09/05/2016   GLUCOSE 113 (H) 09/05/2016   CHOL 164 10/11/2013   TRIG 152.0 (H) 10/11/2013   HDL 69.70 10/11/2013   LDLDIRECT 50.0 05/09/2016   LDLCALC 64 10/11/2013   ALT 8 08/17/2016   AST 17 08/17/2016   NA 142 09/05/2016   K 4.5 09/05/2016   CL 105 09/05/2016   CREATININE 1.19 09/05/2016   BUN 32 (H) 09/05/2016   CO2 27 09/05/2016   TSH 4.21 06/15/2016   INR 3.0 (H) 09/05/2016   HGBA1C 6.2 05/09/2016   MICROALBUR  2.6 (H) 06/15/2016    No results found.  Assessment & Plan:   Problem List Items Addressed This Visit    Anemia in chronic kidney disease    Previously managed with iv iron. Will repeat iron studies with next INR   Lab Results  Component Value Date   FERRITIN 332.0 (H) 09/29/2015   Lab Results  Component Value Date   IRON 47 09/29/2015   TIBC 206 (L) 09/29/2015   FERRITIN 332.0 (H) 09/29/2015         Relevant Medications   cyanocobalamin ((VITAMIN B-12)) injection 1,000 mcg (Completed)   Other Relevant Orders   Ferritin   IBC panel   ANEMIA, B12 DEFICIENCY    Resuming weekly b12 injections given persistent macrocytosis       Relevant Medications   cyanocobalamin ((VITAMIN B-12)) injection 1,000 mcg (Completed)   Diabetes mellitus without complication (HCC)    Managed with diet .  Foot exam is normal. She is taking and ace inhibitor.  Jonne Ply is c/i due to use of warfarin,   Lab Results  Component Value Date   HGBA1C 6.2 05/09/2016         Relevant Orders   Hemoglobin A1c   Microalbumin / creatinine urine ratio   Erosive esophagitis (Chronic)    Secondary to cmv,  Treated by ID with valgancyclovir. Nov 2017.  Follow up egd planned in feb 2018      Esophagitis, CMV (HCC)   Non-traumatic compression fracture of L2 lumbar vertebra (HCC)    Secondary to osteoporosis by 2015 DEXA done at [facility] , she is s/p kyphoplasty May 2016 by Kennedy Bucker for persistent pain .       Protein-calorie malnutrition (HCC)    improving with nutritional support with jevity tube feeds vai peg tube      Viral URI    URI is most likely viral given the mild HEENT Symptoms  And normal exam.   I have explained that in viral URIS, an antibiotic will not help the symptoms and will increase the risk of developing diarrhea.,  Continue oral and nasal decongestants,prn tylenol 650 mq 8 hrs for aches and pains,  Sinus flushes with Lloyd Huger Med's rinse,  .        Other Visit Diagnoses    Monitoring  for anticoagulant use       Relevant Orders   INR/PT      I have discontinued Ms. Lewelling's oxyCODONE-acetaminophen and oxyCODONE-acetaminophen. I have also changed her oxyCODONE-acetaminophen. Additionally, I am having her maintain her hydroxychloroquine, tadalafil (PAH), acetaminophen, loperamide, levocetirizine, predniSONE, free water, metoprolol succinate, cyanocobalamin, mycophenolate, Polyvinyl Alcohol-Povidone (REFRESH OP), OXYGEN, ondansetron, ALPRAZolam, levothyroxine, sterile water for irrigation 237 mL, feeding supplement (JEVITY 1.5 CAL), metoCLOPramide, lisinopril, valganciclovir, letrozole, Calcium Carbonate-Vitamin D, pantoprazole, sucralfate, and warfarin. We administered cyanocobalamin.  Meds ordered this encounter  Medications  . cyanocobalamin ((VITAMIN B-12)) injection 1,000 mcg  . DISCONTD: oxyCODONE-acetaminophen (PERCOCET/ROXICET) 5-325 MG tablet    Sig: Take 1 tablet by mouth 2 (two) times daily as needed for severe pain. May refill on or after September 21 2016    Dispense:  60 tablet    Refill:  0  . DISCONTD: oxyCODONE-acetaminophen (PERCOCET/ROXICET) 5-325 MG tablet    Sig: Take 1 tablet by mouth 2 (two) times daily as needed for severe pain. May refill on or after October 22 2016    Dispense:  60 tablet    Refill:  0  . oxyCODONE-acetaminophen (PERCOCET/ROXICET) 5-325 MG tablet    Sig: Take 1 tablet by mouth 2 (two) times daily as needed for severe pain. May refill on or after November 19 2016    Dispense:  60 tablet    Refill:  0    Medications Discontinued During This Encounter  Medication Reason  . oxyCODONE-acetaminophen (PERCOCET/ROXICET) 5-325 MG tablet Reorder  . oxyCODONE-acetaminophen (PERCOCET/ROXICET) 5-325 MG tablet Reorder  . oxyCODONE-acetaminophen (PERCOCET/ROXICET) 5-325 MG tablet Reorder    Follow-up: Return in 9 days (on 09/14/2016), or RN and lab for B12,  INR , for OV with [place] prior to April 3.   Sherlene Shams, MD

## 2016-09-06 ENCOUNTER — Encounter: Payer: Self-pay | Admitting: Internal Medicine

## 2016-09-06 DIAGNOSIS — J069 Acute upper respiratory infection, unspecified: Secondary | ICD-10-CM | POA: Insufficient documentation

## 2016-09-06 NOTE — Assessment & Plan Note (Addendum)
Secondary to cmv,  Treated by ID with valgancyclovir. Nov 2017.  Follow up egd planned in feb 2018

## 2016-09-06 NOTE — Assessment & Plan Note (Addendum)
Secondary to osteoporosis by 2015 DEXA done at [facility] , she is s/p kyphoplasty May 2016 by Hessie Knows for persistent pain .

## 2016-09-06 NOTE — Assessment & Plan Note (Signed)
Previously managed with iv iron. Will repeat iron studies with next INR   Lab Results  Component Value Date   FERRITIN 332.0 (H) 09/29/2015   Lab Results  Component Value Date   IRON 47 09/29/2015   TIBC 206 (L) 09/29/2015   FERRITIN 332.0 (H) 09/29/2015

## 2016-09-06 NOTE — Assessment & Plan Note (Signed)
URI is most likely viral given the mild HEENT Symptoms  And normal exam.   I have explained that in viral URIS, an antibiotic will not help the symptoms and will increase the risk of developing diarrhea.,  Continue oral and nasal decongestants,prn tylenol 650 mq 8 hrs for aches and pains,  Sinus flushes with Milta Deiters Med's rinse,  .

## 2016-09-06 NOTE — Assessment & Plan Note (Addendum)
Managed with diet .  Foot exam is normal. She is taking and ace inhibitor.  Diana [name] is c/i due to use of warfarin,   Lab Results  Component Value Date   HGBA1C 6.2 05/09/2016

## 2016-09-06 NOTE — Assessment & Plan Note (Signed)
Resuming weekly b12 injections given persistent macrocytosis

## 2016-09-06 NOTE — Assessment & Plan Note (Signed)
improving with nutritional support with jevity tube feeds vai peg tube

## 2016-09-14 ENCOUNTER — Other Ambulatory Visit (INDEPENDENT_AMBULATORY_CARE_PROVIDER_SITE_OTHER): Payer: Medicare Other

## 2016-09-14 ENCOUNTER — Encounter: Payer: Self-pay | Admitting: Internal Medicine

## 2016-09-14 ENCOUNTER — Ambulatory Visit (INDEPENDENT_AMBULATORY_CARE_PROVIDER_SITE_OTHER): Payer: Medicare Other

## 2016-09-14 DIAGNOSIS — D518 Other vitamin B12 deficiency anemias: Secondary | ICD-10-CM | POA: Diagnosis not present

## 2016-09-14 DIAGNOSIS — E119 Type 2 diabetes mellitus without complications: Secondary | ICD-10-CM | POA: Diagnosis not present

## 2016-09-14 DIAGNOSIS — Z7901 Long term (current) use of anticoagulants: Secondary | ICD-10-CM

## 2016-09-14 DIAGNOSIS — N189 Chronic kidney disease, unspecified: Secondary | ICD-10-CM | POA: Diagnosis not present

## 2016-09-14 DIAGNOSIS — M5136 Other intervertebral disc degeneration, lumbar region: Secondary | ICD-10-CM

## 2016-09-14 DIAGNOSIS — D631 Anemia in chronic kidney disease: Secondary | ICD-10-CM | POA: Diagnosis not present

## 2016-09-14 DIAGNOSIS — M5386 Other specified dorsopathies, lumbar region: Secondary | ICD-10-CM

## 2016-09-14 DIAGNOSIS — Z5181 Encounter for therapeutic drug level monitoring: Secondary | ICD-10-CM

## 2016-09-14 LAB — IBC PANEL
IRON: 62 ug/dL (ref 42–145)
Saturation Ratios: 18.8 % — ABNORMAL LOW (ref 20.0–50.0)
Transferrin: 235 mg/dL (ref 212.0–360.0)

## 2016-09-14 LAB — HEMOGLOBIN A1C: HEMOGLOBIN A1C: 5.8 % (ref 4.6–6.5)

## 2016-09-14 LAB — PROTIME-INR
INR: 3.6 ratio — AB (ref 0.8–1.0)
Prothrombin Time: 39 s — ABNORMAL HIGH (ref 9.6–13.1)

## 2016-09-14 LAB — MICROALBUMIN / CREATININE URINE RATIO
CREATININE, U: 161.6 mg/dL
Microalb Creat Ratio: 5.4 mg/g (ref 0.0–30.0)
Microalb, Ur: 8.7 mg/dL — ABNORMAL HIGH (ref 0.0–1.9)

## 2016-09-14 LAB — FERRITIN: FERRITIN: 67.6 ng/mL (ref 10.0–291.0)

## 2016-09-14 MED ORDER — WARFARIN SODIUM 3 MG PO TABS: 3.0000 mg | ORAL_TABLET | Freq: Every day | ORAL | 1 refills | Status: DC

## 2016-09-14 MED ORDER — WARFARIN SODIUM 5 MG PO TABS: 5.0000 mg | ORAL_TABLET | Freq: Every day | ORAL | 3 refills | Status: DC

## 2016-09-14 MED ORDER — CYANOCOBALAMIN 1000 MCG/ML IJ SOLN
1000.0000 ug | Freq: Once | INTRAMUSCULAR | Status: AC
  Administered 2016-09-14: 1000 ug via INTRAMUSCULAR

## 2016-09-14 NOTE — Progress Notes (Signed)
Patient comes in for B 12 injection.   Injected left upper outer quadrant .  Patient tolerated injection well.

## 2016-09-14 NOTE — Addendum Note (Signed)
Addended by: Crecencio Mc on: 09/14/2016 09:16 PM   Modules accepted: Orders

## 2016-09-14 NOTE — Assessment & Plan Note (Addendum)
IN(R is 3.6 on 5 mg warfarin.  Reducing dose from 5 mg daily to 3 mg on Friday and Sunday. Recheck inr in two weeks

## 2016-09-15 NOTE — Progress Notes (Signed)
  I have reviewed the above information and agree with above.   Jequan Shahin, MD 

## 2016-09-20 ENCOUNTER — Telehealth: Payer: Self-pay | Admitting: *Deleted

## 2016-09-20 NOTE — Telephone Encounter (Signed)
3 weekly b12 injection,  Then monthly. Repeat the INR with next injection

## 2016-09-20 NOTE — Telephone Encounter (Signed)
Patient questioned when should she return for a coumadin check and if she's receiving her B12 injections weekly or monthly  Pt contact [phone]

## 2016-09-20 NOTE — Telephone Encounter (Signed)
Patient needs to return for coumadin in 2 week from last draw on 09/14/16 was 3.6 ? B 12 weekly for how long 09/05/16 AVS says weekly for a few weeks

## 2016-09-21 ENCOUNTER — Ambulatory Visit (INDEPENDENT_AMBULATORY_CARE_PROVIDER_SITE_OTHER): Payer: Medicare Other | Admitting: Vascular Surgery

## 2016-09-21 ENCOUNTER — Other Ambulatory Visit: Payer: Self-pay

## 2016-09-21 ENCOUNTER — Other Ambulatory Visit (INDEPENDENT_AMBULATORY_CARE_PROVIDER_SITE_OTHER): Payer: Medicare Other

## 2016-09-21 ENCOUNTER — Ambulatory Visit (INDEPENDENT_AMBULATORY_CARE_PROVIDER_SITE_OTHER): Payer: Medicare Other

## 2016-09-21 ENCOUNTER — Encounter (INDEPENDENT_AMBULATORY_CARE_PROVIDER_SITE_OTHER): Payer: Self-pay

## 2016-09-21 ENCOUNTER — Encounter (INDEPENDENT_AMBULATORY_CARE_PROVIDER_SITE_OTHER): Payer: Self-pay | Admitting: Vascular Surgery

## 2016-09-21 ENCOUNTER — Encounter (INDEPENDENT_AMBULATORY_CARE_PROVIDER_SITE_OTHER): Payer: Medicare Other | Admitting: Vascular Surgery

## 2016-09-21 VITALS — BP 119/68 | HR 82 | Resp 16

## 2016-09-21 DIAGNOSIS — I82403 Acute embolism and thrombosis of unspecified deep veins of lower extremity, bilateral: Secondary | ICD-10-CM

## 2016-09-21 DIAGNOSIS — E785 Hyperlipidemia, unspecified: Secondary | ICD-10-CM | POA: Diagnosis not present

## 2016-09-21 DIAGNOSIS — I872 Venous insufficiency (chronic) (peripheral): Secondary | ICD-10-CM | POA: Diagnosis not present

## 2016-09-21 DIAGNOSIS — Z7901 Long term (current) use of anticoagulants: Secondary | ICD-10-CM

## 2016-09-21 DIAGNOSIS — E538 Deficiency of other specified B group vitamins: Secondary | ICD-10-CM | POA: Diagnosis not present

## 2016-09-21 DIAGNOSIS — I82501 Chronic embolism and thrombosis of unspecified deep veins of right lower extremity: Secondary | ICD-10-CM | POA: Diagnosis not present

## 2016-09-21 DIAGNOSIS — Z5181 Encounter for therapeutic drug level monitoring: Secondary | ICD-10-CM | POA: Diagnosis not present

## 2016-09-21 DIAGNOSIS — IMO0001 Reserved for inherently not codable concepts without codable children: Secondary | ICD-10-CM

## 2016-09-21 LAB — PROTIME-INR
INR: 3.3 ratio — ABNORMAL HIGH (ref 0.8–1.0)
Prothrombin Time: 36.1 s — ABNORMAL HIGH (ref 9.6–13.1)

## 2016-09-21 MED ORDER — CYANOCOBALAMIN 1000 MCG/ML IJ SOLN
1000.0000 ug | Freq: Once | INTRAMUSCULAR | Status: AC
  Administered 2016-09-21: 1000 ug via INTRAMUSCULAR

## 2016-09-21 NOTE — Progress Notes (Signed)
  I have reviewed the above information and agree with above.   Lauren Modisette, MD 

## 2016-09-21 NOTE — Progress Notes (Signed)
Patient comes in for weekly  B 12 injection.  Injected right upper outer quadrant.  Patient tolerated well.

## 2016-09-21 NOTE — Telephone Encounter (Signed)
Patient notified and voiced understanding.

## 2016-09-21 NOTE — Telephone Encounter (Signed)
Left message with patient DPR Diana [name]) to return call to office.

## 2016-09-23 NOTE — Addendum Note (Signed)
Addended by: Valere Dross on: 09/23/2016 02:35 PM   Modules accepted: Orders

## 2016-09-23 NOTE — Progress Notes (Signed)
Pt INR was scheduled for future

## 2016-09-25 ENCOUNTER — Other Ambulatory Visit: Payer: Self-pay | Admitting: Internal Medicine

## 2016-09-26 ENCOUNTER — Ambulatory Visit
Admission: RE | Admit: 2016-09-26 | Discharge: 2016-09-26 | Disposition: A | Discharge status: Home / Self Care | Payer: Medicare Other | Source: Ambulatory Visit | Attending: Internal Medicine | Admitting: Internal Medicine

## 2016-09-26 ENCOUNTER — Other Ambulatory Visit: Payer: Self-pay | Admitting: Internal Medicine

## 2016-09-26 DIAGNOSIS — Z79899 Other long term (current) drug therapy: Secondary | ICD-10-CM | POA: Diagnosis not present

## 2016-09-26 DIAGNOSIS — Z86718 Personal history of other venous thrombosis and embolism: Secondary | ICD-10-CM | POA: Diagnosis not present

## 2016-09-26 DIAGNOSIS — K224 Dyskinesia of esophagus: Secondary | ICD-10-CM | POA: Diagnosis not present

## 2016-09-26 DIAGNOSIS — M329 Systemic lupus erythematosus, unspecified: Secondary | ICD-10-CM | POA: Diagnosis not present

## 2016-09-26 DIAGNOSIS — R5383 Other fatigue: Secondary | ICD-10-CM | POA: Diagnosis not present

## 2016-09-26 DIAGNOSIS — C50412 Malignant neoplasm of upper-outer quadrant of left female breast: Secondary | ICD-10-CM

## 2016-09-26 DIAGNOSIS — Z853 Personal history of malignant neoplasm of breast: Secondary | ICD-10-CM | POA: Insufficient documentation

## 2016-09-26 DIAGNOSIS — L930 Discoid lupus erythematosus: Secondary | ICD-10-CM | POA: Diagnosis not present

## 2016-09-26 DIAGNOSIS — Z7952 Long term (current) use of systemic steroids: Secondary | ICD-10-CM | POA: Diagnosis not present

## 2016-09-26 DIAGNOSIS — M810 Age-related osteoporosis without current pathological fracture: Secondary | ICD-10-CM | POA: Diagnosis not present

## 2016-09-26 DIAGNOSIS — J849 Interstitial pulmonary disease, unspecified: Secondary | ICD-10-CM | POA: Diagnosis not present

## 2016-09-26 DIAGNOSIS — I272 Pulmonary hypertension, unspecified: Secondary | ICD-10-CM | POA: Diagnosis not present

## 2016-09-26 DIAGNOSIS — R928 Other abnormal and inconclusive findings on diagnostic imaging of breast: Secondary | ICD-10-CM | POA: Diagnosis not present

## 2016-09-26 DIAGNOSIS — R1310 Dysphagia, unspecified: Secondary | ICD-10-CM | POA: Diagnosis not present

## 2016-09-26 DIAGNOSIS — Z79811 Long term (current) use of aromatase inhibitors: Secondary | ICD-10-CM | POA: Diagnosis not present

## 2016-09-29 NOTE — Progress Notes (Signed)
Letter sent to notify patient

## 2016-10-03 NOTE — Progress Notes (Signed)
Subjective:    Patient ID: Diana [name], female    DOB: 31-Jan-1935, 82 y.o.   MRN: [id number] Chief Complaint  Patient presents with  . Follow-up   Patient presents for her first post-procedure follow up. She is s/p IVC filter removal on 08/23/16. She is without complaint post-procedure course has been uneventful. Incision has healed well.    Review of Systems  Constitutional: Negative.   HENT: Negative.   Eyes: Negative.   Respiratory: Negative.   Cardiovascular: Negative.   Gastrointestinal: Negative.   Endocrine: Negative.   Genitourinary: Negative.   Musculoskeletal: Negative.   Skin: Negative.   Allergic/Immunologic: Negative.   Neurological: Negative.   Hematological: Negative.   Psychiatric/Behavioral: Negative.       Objective:   Physical Exam  Constitutional: She is oriented to person, place, and time. She appears well-developed and well-nourished.  HENT:  Head: Normocephalic and atraumatic.  Right Ear: External ear normal.  Left Ear: External ear normal.  Eyes: Conjunctivae and EOM are normal. Pupils are equal, round, and reactive to light.  Neck: Normal range of motion.  Cardiovascular: Normal rate, regular rhythm, normal heart sounds and intact distal pulses.   Pulses:      Radial pulses are 2+ on the right side, and 2+ on the left side.       Dorsalis pedis pulses are 2+ on the right side, and 2+ on the left side.       Posterior tibial pulses are 2+ on the right side, and 2+ on the left side.  Pulmonary/Chest: Effort normal and breath sounds normal.  Abdominal: Soft. Bowel sounds are normal.  Musculoskeletal: Normal range of motion. She exhibits no edema.  Neurological: She is alert and oriented to person, place, and time.  Skin: Skin is warm and dry.  Psychiatric: She has a normal mood and affect. Her behavior is normal. Judgment and thought content normal.   BP 119/68   Pulse 82   Resp 16   Past Medical History:  Diagnosis Date  . Acute  glomerulonephritis with other specified pathological lesion in kidney in disease classified elsewhere(580.81)   . Alveolar aeration decreased   . Anemia   . Arthritis   . Atrial fibrillation (HCC)   . Breast cancer (HCC) 06-Nov-202015   positive, radiation  . Cancer (HCC)    Breast  . CHF (congestive heart failure) (HCC)   . Diaphragmatic hernia   . DVT (deep venous thrombosis) (HCC)   . Dysrhythmia   . Environmental allergies   . Esophageal reflux   . Feeding problem    FEEDING TUBE  . Hiatal hernia   . Hyperlipidemia   . Hypertension   . Lower extremity edema   . Lupus (systemic lupus erythematosus) (HCC)   . Neuropathy (HCC)   . Osteopenia   . Oxygen deficiency    USES HS  . Peripheral neuropathy, hereditary/idiopathic   . Pneumonia   . Primary pulmonary hypertension (HCC)   . Pulmonary hypertension   . Renal insufficiency   . Sciatica   . Shingles   . Shingles (herpes zoster) polyneuropathy March 2013   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  . Shortness of breath dyspnea   . Unspecified hypothyroidism   . Unspecified menopausal and postmenopausal disorder   . Urinary incontinence without sensory awareness   . Vitamin B deficiency     Social History   Social History  . Marital status: Divorced    Spouse name: N/A  . Number of children:  N/A  . Years of education: N/A   Occupational History  . 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 Smoking status: Former Smoker    Quit date: 05/25/1991  . Smokeless tobacco: Never Used  . Alcohol use Yes     Comment: rare  . Drug use: No  . Sexual activity: Not on file   Other Topics Concern  . Not on file   Social History Narrative  . No narrative on file   Past Surgical History:  Procedure Laterality Date  . BREAST EXCISIONAL BIOPSY Left Jan 09, 202015   positive  . BREAST SURGERY    . CATARACT EXTRACTION W/PHACO Right 04/05/2016   Procedure: CATARACT EXTRACTION PHACO AND INTRAOCULAR  LENS PLACEMENT (IOC);  Surgeon: Galen Manila, MD;  Location: [facility] ORS;  Service: Ophthalmology;  Laterality: Right;  [place] 01:20 AP% 23.9 CDE 19.29 fluid pack lot # [id number]  . CATARACT EXTRACTION W/PHACO Left 04/26/2016   Procedure: CATARACT EXTRACTION PHACO AND INTRAOCULAR LENS PLACEMENT (IOC);  Surgeon: Galen Manila, MD;  Location: [facility];  Service: Ophthalmology;  Laterality: Left;   [place] 00:59 AP% 23.2 CDE 13.83 Fluid pack lot # [id number]  . DIAGNOSTIC LAPAROSCOPY    . ESOPHAGOGASTRODUODENOSCOPY N/A 07/19/2015   Procedure: ESOPHAGOGASTRODUODENOSCOPY (EGD);  Surgeon: Wallace Cullens, MD;  Location: [facility] ENDOSCOPY;  Service: Gastroenterology;  Laterality: N/A;  . ESOPHAGOGASTRODUODENOSCOPY (EGD) WITH PROPOFOL N/A 03/28/2015   Procedure: ESOPHAGOGASTRODUODENOSCOPY (EGD) WITH PROPOFOL;  Surgeon: Earline Mayotte, MD;  Location: [facility] ENDOSCOPY;  Service: Endoscopy;  Laterality: N/A;  . ESOPHAGOGASTRODUODENOSCOPY (EGD) WITH PROPOFOL N/A 06/10/2015   Procedure: ESOPHAGOGASTRODUODENOSCOPY (EGD) WITH PROPOFOL;  Surgeon: Christena Deem, MD;  Location: [facility] ENDOSCOPY;  Service: Endoscopy;  Laterality: N/A;  . ESOPHAGOGASTRODUODENOSCOPY (EGD) WITH PROPOFOL N/A 10/20/2015   Procedure: ESOPHAGOGASTRODUODENOSCOPY (EGD) WITH PROPOFOL;  Surgeon: Christena Deem, MD;  Location: [facility] ENDOSCOPY;  Service: Endoscopy;  Laterality: N/A;  . ESOPHAGOGASTRODUODENOSCOPY (EGD) WITH PROPOFOL N/A 02/23/2016   Procedure: ESOPHAGOGASTRODUODENOSCOPY (EGD) WITH PROPOFOL;  Surgeon: Christena Deem, MD;  Location: [facility] ENDOSCOPY;  Service: Endoscopy;  Laterality: N/A;  . ESOPHAGOGASTRODUODENOSCOPY (EGD) WITH PROPOFOL N/A 06/30/2016   Procedure: ESOPHAGOGASTRODUODENOSCOPY (EGD) WITH PROPOFOL;  Surgeon: Christena Deem, MD;  Location: [facility]COPY;  Service: Endoscopy;  Laterality: N/A;  . KYPHOPLASTY N/A 02/10/2015   Procedure: [id number];  Surgeon: Kennedy Bucker, MD;  Location: [facility] ORS;  Service: Orthopedics;   Laterality: N/A;  . LAPAROTOMY N/A 03/11/2015   Procedure: REDUCTION OF GASTRIC VOLVULUS, SPLEENECTOMY, GASTRIC TUBE PLACEMENT;  Surgeon: Earline Mayotte, MD;  Location: [facility] ORS;  Service: General;  Laterality: N/A;  . MOHS SURGERY    . PERIPHERAL VASCULAR CATHETERIZATION N/A 06/12/2015   Procedure: IVC Filter Insertion;  Surgeon: Annice Needy, MD;  Location: [facility] INVASIVE CV LAB;  Service: Cardiovascular;  Laterality: N/A;  . PERIPHERAL VASCULAR CATHETERIZATION N/A 08/23/2016   Procedure: IVC Filter Removal;  Surgeon: Renford Dills, MD;  Location: [facility] INVASIVE CV LAB;  Service: Cardiovascular;  Laterality: N/A;  . STOMACH SURGERY     for volvolus   Family History  Problem Relation Age of Onset  . Colon cancer Mother   . Alcohol abuse Sister   . Breast cancer Sister 33  . Cancer Brother   . Lung cancer Son    Allergies  Allergen Reactions  . Amoxicillin Other (See Comments)    Reaction:  Stomach cramps   . Cefuroxime Itching  . Contrast Media [Iodinated Diagnostic Agents]  Hives  . Metrizamide Hives  . Morphine And Related     Reaction: face flushed    . Propoxyphene     Stomach cramp  . Cephalexin Rash  . Sulfa Antibiotics Rash  . Sulfonamide Derivatives Rash  . Tramadol Hcl Rash  . Venlafaxine Rash      Assessment & Plan:  Patient presents for her first post-procedure follow up. She is s/p IVC filter removal on 08/23/16. She is without complaint post-procedure course has been uneventful. Incision has healed well.   1. Deep vein thrombosis (DVT) of both lower extremities, unspecified chronicity, unspecified vein (HCC) - Resolved IVC filter successfully removed. F/U PRN  2. Venous insufficiency of both lower extremities - Stable Compression and elevation to control symptoms.  3. Hyperlipidemia, unspecified hyperlipidemia type - Stable Encouraged good control as its slows the progression of atherosclerotic disease  Current Outpatient Prescriptions on File Prior to  Visit  Medication Sig Dispense Refill  . acetaminophen (TYLENOL) 325 MG tablet Take 650 mg by mouth every 4 (four) hours as needed for mild pain or fever. Reported on 02/23/2016    . ALPRAZolam (XANAX) 0.5 MG tablet Take 0.5 mg by mouth at bedtime as needed for anxiety. Reported on 04/05/2016    . Calcium Carbonate-Vitamin D 600-400 MG-UNIT tablet Take 1 tablet by mouth daily.    . cyanocobalamin (,VITAMIN B-12,) 1000 MCG/ML injection Inject 1 mL (1,000 mcg total) into the muscle every 30 (thirty) days. Pt uses on the 1st of every month. 10 mL 1  . hydroxychloroquine (PLAQUENIL) 200 MG tablet Take 200 mg by mouth daily.    Marland Kitchen letrozole (FEMARA) 2.5 MG tablet Take 1 tablet (2.5 mg total) by mouth daily. (Patient taking differently: Take 5 mg by mouth daily. ) 90 tablet 3  . levocetirizine (XYZAL) 5 MG tablet Take 5 mg by mouth every evening.     Marland Kitchen lisinopril (PRINIVIL,ZESTRIL) 5 MG tablet Take 1 tablet (5 mg total) by mouth daily. 90 tablet 3  . loperamide (IMODIUM) 2 MG capsule Take 2-4 mg by mouth as needed for diarrhea or loose stools. Reported on 04/05/2016    . metoCLOPramide (REGLAN) 10 MG tablet Take 1 tablet (10 mg total) by mouth 4 (four) times daily -  before meals and at bedtime. 360 tablet 1  . metoprolol succinate (TOPROL-XL) 25 MG 24 hr tablet Take 12.5 mg by mouth daily.     . mycophenolate (CELLCEPT) 500 MG tablet Take 1,000 mg by mouth 2 (two) times daily.    . Nutritional Supplements (FEEDING SUPPLEMENT, JEVITY 1.5 CAL,) LIQD Place 237 mLs into feeding tube continuous.    . ondansetron (ZOFRAN-ODT) 4 MG disintegrating tablet Take 4 mg by mouth every 4 (four) hours as needed for nausea or vomiting.    Marland Kitchen oxyCODONE-acetaminophen (PERCOCET/ROXICET) 5-325 MG tablet Take 1 tablet by mouth 2 (two) times daily as needed for severe pain. May refill on or after November 19 2016 60 tablet 0  . OXYGEN Inhale 2 mLs into the lungs at bedtime.    . pantoprazole (PROTONIX) 40 MG tablet Take 40 mg by mouth  2 (two) times daily.    . Polyvinyl Alcohol-Povidone (REFRESH OP) Place 2 drops into both eyes daily as needed (dry eyes).     . predniSONE (DELTASONE) 5 MG tablet Take 5 mg by mouth daily.     Marland Kitchen sterile water for irrigation 237 mL Give 2.5 mL/hr by tube continuous.    . sucralfate (CARAFATE) 1 GM/10ML suspension  Take 1 g by mouth 2 (two) times daily.    . Tadalafil, PAH, (ADCIRCA) 20 MG TABS Take 40 mg by mouth daily.    . valganciclovir (VALCYTE) 50 MG/ML SOLR Take 9 mLs (450 mg total) by mouth daily. 270 mL 1  . warfarin (COUMADIN) 3 MG tablet Take 1 tablet (3 mg total) by mouth daily. Two days per week, 5 mg all other day s 30 tablet 1  . warfarin (COUMADIN) 5 MG tablet Take 1 tablet (5 mg total) by mouth daily. 5 days per week 3 mg the other days 30 tablet 3  . Water For Irrigation, Sterile (FREE WATER) SOLN Place 150 mLs into feeding tube 2 (two) times daily. 1500 mL 2   No current facility-administered medications on file prior to visit.     There are no Patient Instructions on file for this visit. No Follow-up on file.   Atianna Haidar A Berthold Glace, PA-C

## 2016-10-04 ENCOUNTER — Other Ambulatory Visit (INDEPENDENT_AMBULATORY_CARE_PROVIDER_SITE_OTHER): Payer: Medicare Other

## 2016-10-04 DIAGNOSIS — Z5181 Encounter for therapeutic drug level monitoring: Secondary | ICD-10-CM | POA: Diagnosis not present

## 2016-10-04 DIAGNOSIS — Z7901 Long term (current) use of anticoagulants: Secondary | ICD-10-CM

## 2016-10-04 LAB — PROTIME-INR
INR: 1.7 ratio — ABNORMAL HIGH (ref 0.8–1.0)
Prothrombin Time: 18.3 s — ABNORMAL HIGH (ref 9.6–13.1)

## 2016-10-05 ENCOUNTER — Other Ambulatory Visit: Payer: Medicare Other

## 2016-10-18 ENCOUNTER — Telehealth: Payer: Self-pay | Admitting: Radiology

## 2016-10-18 DIAGNOSIS — Z7901 Long term (current) use of anticoagulants: Secondary | ICD-10-CM

## 2016-10-18 NOTE — Addendum Note (Signed)
Addended by: Arby Barrette on: 10/18/2016 09:14 AM   Modules accepted: Orders

## 2016-10-18 NOTE — Addendum Note (Signed)
Addended by: Crecencio Mc on: 10/18/2016 08:45 AM   Modules accepted: Orders

## 2016-10-18 NOTE — Telephone Encounter (Signed)
Pt coming for labs tomorrow, please place orders. Thank you.  

## 2016-10-18 NOTE — Telephone Encounter (Signed)
See prev note

## 2016-10-18 NOTE — Telephone Encounter (Signed)
Ordered.  Are you able to set up a standing/revolving monthly order for PT/INR?

## 2016-10-19 ENCOUNTER — Other Ambulatory Visit (INDEPENDENT_AMBULATORY_CARE_PROVIDER_SITE_OTHER): Payer: Medicare Other

## 2016-10-19 DIAGNOSIS — Z7901 Long term (current) use of anticoagulants: Secondary | ICD-10-CM

## 2016-10-19 LAB — PROTIME-INR
INR: 2.6 ratio — ABNORMAL HIGH (ref 0.8–1.0)
PROTHROMBIN TIME: 28.3 s — AB (ref 9.6–13.1)

## 2016-10-26 ENCOUNTER — Ambulatory Visit (INDEPENDENT_AMBULATORY_CARE_PROVIDER_SITE_OTHER): Payer: Medicare Other

## 2016-10-26 DIAGNOSIS — E538 Deficiency of other specified B group vitamins: Secondary | ICD-10-CM

## 2016-10-26 MED ORDER — CYANOCOBALAMIN 1000 MCG/ML IJ SOLN
1000.0000 ug | Freq: Once | INTRAMUSCULAR | Status: AC
  Administered 2016-10-26: 1000 ug via INTRAMUSCULAR

## 2016-10-26 NOTE — Progress Notes (Signed)
  I have reviewed the above information and agree with above.   Teresa Tullo, MD 

## 2016-10-26 NOTE — Progress Notes (Signed)
Patient comes in for B 12 injection. Injected left  Upper outer  quadrant.  Patient tolerated injection.

## 2016-11-01 ENCOUNTER — Telehealth: Payer: Self-pay | Admitting: *Deleted

## 2016-11-01 NOTE — Telephone Encounter (Signed)
No change sicne December we will repeat in April

## 2016-11-01 NOTE — Telephone Encounter (Signed)
Pt rheumatologist took pt's creatine, the reading was 1.19 Pt contact [phone]

## 2016-11-01 NOTE — Telephone Encounter (Signed)
LOV: 09/05/16 Next OV: 12/28/16 Last creatinine on 09/05/16= 1.19.

## 2016-11-10 ENCOUNTER — Inpatient Hospital Stay: Payer: Medicare Other | Attending: Oncology | Admitting: Oncology

## 2016-11-10 ENCOUNTER — Inpatient Hospital Stay: Payer: Medicare Other

## 2016-11-10 VITALS — BP 117/70 | HR 71 | Temp 98.6°F | Wt 132.5 lb

## 2016-11-10 DIAGNOSIS — K219 Gastro-esophageal reflux disease without esophagitis: Secondary | ICD-10-CM | POA: Insufficient documentation

## 2016-11-10 DIAGNOSIS — D631 Anemia in chronic kidney disease: Secondary | ICD-10-CM

## 2016-11-10 DIAGNOSIS — N189 Chronic kidney disease, unspecified: Secondary | ICD-10-CM

## 2016-11-10 DIAGNOSIS — Z9081 Acquired absence of spleen: Secondary | ICD-10-CM | POA: Diagnosis not present

## 2016-11-10 DIAGNOSIS — Z7901 Long term (current) use of anticoagulants: Secondary | ICD-10-CM | POA: Insufficient documentation

## 2016-11-10 DIAGNOSIS — Z923 Personal history of irradiation: Secondary | ICD-10-CM | POA: Diagnosis not present

## 2016-11-10 DIAGNOSIS — Z87891 Personal history of nicotine dependence: Secondary | ICD-10-CM | POA: Insufficient documentation

## 2016-11-10 DIAGNOSIS — Z79811 Long term (current) use of aromatase inhibitors: Secondary | ICD-10-CM | POA: Diagnosis not present

## 2016-11-10 DIAGNOSIS — M8580 Other specified disorders of bone density and structure, unspecified site: Secondary | ICD-10-CM

## 2016-11-10 DIAGNOSIS — M3500 Sicca syndrome, unspecified: Secondary | ICD-10-CM | POA: Insufficient documentation

## 2016-11-10 DIAGNOSIS — G629 Polyneuropathy, unspecified: Secondary | ICD-10-CM | POA: Diagnosis not present

## 2016-11-10 DIAGNOSIS — I129 Hypertensive chronic kidney disease with stage 1 through stage 4 chronic kidney disease, or unspecified chronic kidney disease: Secondary | ICD-10-CM | POA: Diagnosis not present

## 2016-11-10 DIAGNOSIS — E039 Hypothyroidism, unspecified: Secondary | ICD-10-CM | POA: Diagnosis not present

## 2016-11-10 DIAGNOSIS — C50412 Malignant neoplasm of upper-outer quadrant of left female breast: Secondary | ICD-10-CM

## 2016-11-10 DIAGNOSIS — Z79899 Other long term (current) drug therapy: Secondary | ICD-10-CM | POA: Diagnosis not present

## 2016-11-10 DIAGNOSIS — I4891 Unspecified atrial fibrillation: Secondary | ICD-10-CM | POA: Insufficient documentation

## 2016-11-10 DIAGNOSIS — M329 Systemic lupus erythematosus, unspecified: Secondary | ICD-10-CM | POA: Insufficient documentation

## 2016-11-10 DIAGNOSIS — Z86718 Personal history of other venous thrombosis and embolism: Secondary | ICD-10-CM | POA: Insufficient documentation

## 2016-11-10 DIAGNOSIS — E785 Hyperlipidemia, unspecified: Secondary | ICD-10-CM | POA: Insufficient documentation

## 2016-11-10 DIAGNOSIS — K449 Diaphragmatic hernia without obstruction or gangrene: Secondary | ICD-10-CM | POA: Insufficient documentation

## 2016-11-10 DIAGNOSIS — Z7952 Long term (current) use of systemic steroids: Secondary | ICD-10-CM | POA: Insufficient documentation

## 2016-11-10 DIAGNOSIS — Z170 Estrogen receptor positive status [ER+]: Secondary | ICD-10-CM

## 2016-11-10 DIAGNOSIS — I509 Heart failure, unspecified: Secondary | ICD-10-CM | POA: Insufficient documentation

## 2016-11-10 DIAGNOSIS — D518 Other vitamin B12 deficiency anemias: Secondary | ICD-10-CM

## 2016-11-10 LAB — CBC WITH DIFFERENTIAL/PLATELET
Basophils Absolute: 0.1 10*3/uL (ref 0–0.1)
Basophils Relative: 1 %
EOS ABS: 0 10*3/uL (ref 0–0.7)
Eosinophils Relative: 1 %
HEMATOCRIT: 32.3 % — AB (ref 35.0–47.0)
HEMOGLOBIN: 10.6 g/dL — AB (ref 12.0–16.0)
LYMPHS ABS: 0.8 10*3/uL — AB (ref 1.0–3.6)
LYMPHS PCT: 12 %
MCH: 32.8 pg (ref 26.0–34.0)
MCHC: 32.9 g/dL (ref 32.0–36.0)
MCV: 99.8 fL (ref 80.0–100.0)
MONOS PCT: 8 %
Monocytes Absolute: 0.6 10*3/uL (ref 0.2–0.9)
NEUTROS ABS: 5.3 10*3/uL (ref 1.4–6.5)
NEUTROS PCT: 78 %
Platelets: 381 10*3/uL (ref 150–440)
RBC: 3.24 MIL/uL — AB (ref 3.80–5.20)
RDW: 13.7 % (ref 11.5–14.5)
WBC: 6.8 10*3/uL (ref 3.6–11.0)

## 2016-11-10 LAB — COMPREHENSIVE METABOLIC PANEL
ALK PHOS: 67 U/L (ref 38–126)
ALT: 13 U/L — AB (ref 14–54)
AST: 25 U/L (ref 15–41)
Albumin: 3.7 g/dL (ref 3.5–5.0)
Anion gap: 6 (ref 5–15)
BILIRUBIN TOTAL: 0.5 mg/dL (ref 0.3–1.2)
BUN: 23 mg/dL — ABNORMAL HIGH (ref 6–20)
CALCIUM: 9.2 mg/dL (ref 8.9–10.3)
CO2: 25 mmol/L (ref 22–32)
CREATININE: 1.23 mg/dL — AB (ref 0.44–1.00)
Chloride: 105 mmol/L (ref 101–111)
GFR calc non Af Amer: 41 mL/min — ABNORMAL LOW (ref >60)
GFR, EST AFRICAN AMERICAN: 47 mL/min — AB (ref >60)
Glucose, Bld: 105 mg/dL — ABNORMAL HIGH (ref 65–99)
Potassium: 4.6 mmol/L (ref 3.5–5.1)
SODIUM: 136 mmol/L (ref 135–145)
TOTAL PROTEIN: 7.1 g/dL (ref 6.5–8.1)

## 2016-11-10 NOTE — Progress Notes (Signed)
Hematology/Oncology Consult note [facility]  Telephone:([phone] [phone]  Patient Care Team: Sherlene Shams, MD as PCP - General (Internal Medicine) Earline Mayotte, MD (General Surgery) Darci Current, MD as Attending Physician (Emergency Medicine) Luella Cook, RN as [facility] Care Management   Name of the patient: Diana [name]  [id number]  1935/03/17   Date of visit: 11/10/16  Diagnosis- 1. Macrocytic anemia likely secondary to kidney disease and or underlying MDS 2. History of ER/PR positive stage I left breast cancer currently on hormone therapy with letrozole  Chief complaint/ Reason for visit- routine f/u  Heme/Onc history:  Oncology History   82 year old female with stage I (T1 C. N0 M0) invasive mammary carcinoma status post wide local excision and sentinel node biopsy tumor is ER/PR positive HER-2/neu not over expressed with a low Oncotype DX score of 17 for adjuvant whole breast radiation Oncotype DX score is 17 Patient has finished radiation therapy in December of 2015 Started letrozole from December, 2015     Breast cancer of upper-outer quadrant of left female breast (HCC)   05/26/2014 Initial Diagnosis    Breast cancer of upper-outer quadrant of left female breast (HCC), stage I, T1 cN0 M0, ER/PR positive HER-2/neu not overexpressed.      07/12/2014 Oncotype testing    Oncotype DX score 17       - 09/05/2014 Radiation Therapy         09/05/2014 -  Anti-estrogen oral therapy    Started letrozole      Patient also has other chronic medical problems in gastric wall reveals requiring laparotomy and splenectomy in July 2016. She has chronic peg tube and needs nightly PEG feeds and takes by mouth feeds during the day. She has a history of SLE/ Sjogrens as well as history ofCMV esophagitis. She is on chronic prednisone therapy secondary to her Sjogrens.  Interval history- patient continues to  take her letrozole as well as calcium and vitamin D supplements without any significant side effects. Patient has a history of recurrent DVTs in the past and had an IVC filter placed which was recently taken out and was found to have a clot in that region and was restarted on Coumadin. She denies any bleeding complaints at this time.she complains of occasional left breast tenderness.  ECOG PS- 2  Review of systems- Review of Systems  Constitutional: Positive for malaise/fatigue. Negative for chills, fever and weight loss.  HENT: Negative for congestion, ear discharge and nosebleeds.   Eyes: Negative for blurred vision.  Respiratory: Negative for cough, hemoptysis, sputum production, shortness of breath and wheezing.   Cardiovascular: Negative for chest pain, palpitations, orthopnea and claudication.  Gastrointestinal: Positive for heartburn. Negative for abdominal pain, blood in stool, constipation, diarrhea, melena, nausea and vomiting.  Genitourinary: Negative for dysuria, flank pain, frequency, hematuria and urgency.  Musculoskeletal: Positive for back pain. Negative for joint pain and myalgias.  Skin: Negative for rash.  Neurological: Negative for dizziness, tingling, focal weakness, seizures, weakness and headaches.  Endo/Heme/Allergies: Does not bruise/bleed easily.  Psychiatric/Behavioral: Negative for depression and suicidal ideas. The patient does not have insomnia.      Current treatment- letrozole  Allergies  Allergen Reactions  . Amoxicillin Other (See Comments)    Reaction:  Stomach cramps   . Cefuroxime Itching  . Contrast Media [Iodinated Diagnostic Agents] Hives  . Metrizamide Hives  . Morphine And Related     Reaction: face flushed    .  Propoxyphene     Stomach cramp  . Cephalexin Rash  . Sulfa Antibiotics Rash    REACTION: Rash  . Sulfonamide Derivatives Rash  . Tramadol Hcl Rash  . Venlafaxine Rash     Past Medical History:  Diagnosis Date  . Acute  glomerulonephritis with other specified pathological lesion in kidney in disease classified elsewhere(580.81)   . Alveolar aeration decreased   . Anemia   . Arthritis   . Atrial fibrillation (HCC)   . Breast cancer (HCC) 06/03/2014   positive, radiation  . Cancer (HCC)    Breast  . CHF (congestive heart failure) (HCC)   . Diaphragmatic hernia   . DVT (deep venous thrombosis) (HCC)   . Dysrhythmia   . Environmental allergies   . Esophageal reflux   . Feeding problem    FEEDING TUBE  . Hiatal hernia   . Hyperlipidemia   . Hypertension   . Lower extremity edema   . Lupus (systemic lupus erythematosus) (HCC)   . Neuropathy (HCC)   . Osteopenia   . Oxygen deficiency    USES HS  . Peripheral neuropathy, hereditary/idiopathic   . Pneumonia   . Primary pulmonary hypertension (HCC)   . Pulmonary hypertension   . Renal insufficiency   . Sciatica   . Shingles   . Shingles (herpes zoster) polyneuropathy March 2013   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  . Shortness of breath dyspnea   . Unspecified hypothyroidism   . Unspecified menopausal and postmenopausal disorder   . Urinary incontinence without sensory awareness   . Vitamin B deficiency      Past Surgical History:  Procedure Laterality Date  . BREAST EXCISIONAL BIOPSY Left 06/03/2014   positive  . BREAST SURGERY    . CATARACT EXTRACTION W/PHACO Right 04/05/2016   Procedure: CATARACT EXTRACTION PHACO AND INTRAOCULAR LENS PLACEMENT (IOC);  Surgeon: Galen Manila, MD;  Location: [facility] ORS;  Service: Ophthalmology;  Laterality: Right;  [place] 01:20 AP% 23.9 CDE 19.29 fluid pack lot # [id number]  . CATARACT EXTRACTION W/PHACO Left 04/26/2016   Procedure: CATARACT EXTRACTION PHACO AND INTRAOCULAR LENS PLACEMENT (IOC);  Surgeon: Galen Manila, MD;  Location: [facility] ORS;  Service: Ophthalmology;  Laterality: Left;   [place] 00:59 AP% 23.2 CDE 13.83 Fluid pack lot # [id number]  . DIAGNOSTIC LAPAROSCOPY    . ESOPHAGOGASTRODUODENOSCOPY N/A  07/19/2015   Procedure: ESOPHAGOGASTRODUODENOSCOPY (EGD);  Surgeon: Wallace Cullens, MD;  Location: [facility] ENDOSCOPY;  Service: Gastroenterology;  Laterality: N/A;  . ESOPHAGOGASTRODUODENOSCOPY (EGD) WITH PROPOFOL N/A 03/28/2015   Procedure: ESOPHAGOGASTRODUODENOSCOPY (EGD) WITH PROPOFOL;  Surgeon: Earline Mayotte, MD;  Location: [facility] ENDOSCOPY;  Service: Endoscopy;  Laterality: N/A;  . ESOPHAGOGASTRODUODENOSCOPY (EGD) WITH PROPOFOL N/A 06/10/2015   Procedure: ESOPHAGOGASTRODUODENOSCOPY (EGD) WITH PROPOFOL;  Surgeon: Christena Deem, MD;  Location: [facility] ENDOSCOPY;  Service: Endoscopy;  Laterality: N/A;  . ESOPHAGOGASTRODUODENOSCOPY (EGD) WITH PROPOFOL N/A 10/20/2015   Procedure: ESOPHAGOGASTRODUODENOSCOPY (EGD) WITH PROPOFOL;  Surgeon: Christena Deem, MD;  Location: [facility] ENDOSCOPY;  Service: Endoscopy;  Laterality: N/A;  . ESOPHAGOGASTRODUODENOSCOPY (EGD) WITH PROPOFOL N/A 02/23/2016   Procedure: ESOPHAGOGASTRODUODENOSCOPY (EGD) WITH PROPOFOL;  Surgeon: Christena Deem, MD;  Location: [facility] ENDOSCOPY;  Service: Endoscopy;  Laterality: N/A;  . ESOPHAGOGASTRODUODENOSCOPY (EGD) WITH PROPOFOL N/A 06/30/2016   Procedure: ESOPHAGOGASTRODUODENOSCOPY (EGD) WITH PROPOFOL;  Surgeon: Christena Deem, MD;  Location: [facility] ENDOSCOPY;  Service: Endoscopy;  Laterality: N/A;  . KYPHOPLASTY N/A 02/10/2015   Procedure: HYQMVHQIONG/E9;  Surgeon: Kennedy Bucker, MD;  Location: [facility] ORS;  Service: Orthopedics;  Laterality: N/A;  . LAPAROTOMY N/A 03/11/2015   Procedure: REDUCTION OF GASTRIC VOLVULUS, SPLEENECTOMY, GASTRIC TUBE PLACEMENT;  Surgeon: Earline Mayotte, MD;  Location: [facility] ORS;  Service: General;  Laterality: N/A;  . MOHS SURGERY    . PERIPHERAL VASCULAR CATHETERIZATION N/A 06/12/2015   Procedure: IVC Filter Insertion;  Surgeon: Annice Needy, MD;  Location: [facility] INVASIVE CV LAB;  Service: Cardiovascular;  Laterality: N/A;  . PERIPHERAL VASCULAR CATHETERIZATION N/A 08/23/2016   Procedure: IVC Filter Removal;  Surgeon:  Renford Dills, MD;  Location: [facility] INVASIVE CV LAB;  Service: Cardiovascular;  Laterality: N/A;  . STOMACH SURGERY     for volvolus    Social History   Social History  . Marital status: Divorced    Spouse name: N/A  . Number of children: N/A  . Years of education: N/A   Occupational History  . 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 Smoking status: Former Smoker    Quit date: 05/25/1991  . Smokeless tobacco: Never Used  . Alcohol use Yes     Comment: rare  . Drug use: No  . Sexual activity: Not on file   Other Topics Concern  . Not on file   Social History Narrative  . No narrative on file    Family History  Problem Relation Age of Onset  . Colon cancer Mother   . Alcohol abuse Sister   . Breast cancer Sister 36  . Cancer Brother   . Lung cancer Son      Current Outpatient Prescriptions:  .  acetaminophen (TYLENOL) 325 MG tablet, Take 650 mg by mouth every 4 (four) hours as needed for mild pain or fever. Reported on 02/23/2016, Disp: , Rfl:  .  ALPRAZolam (XANAX) 0.5 MG tablet, Take 0.5 mg by mouth at bedtime as needed for anxiety. Reported on 04/05/2016, Disp: , Rfl:  .  Calcium Carbonate-Vitamin D 600-400 MG-UNIT tablet, Take 1 tablet by mouth daily., Disp: , Rfl:  .  cyanocobalamin (,VITAMIN B-12,) 1000 MCG/ML injection, Inject 1 mL (1,000 mcg total) into the muscle every 30 (thirty) days. Pt uses on the 1st of every month., Disp: 10 mL, Rfl: 1 .  hydroxychloroquine (PLAQUENIL) 200 MG tablet, Take 200 mg by mouth daily., Disp: , Rfl:  .  letrozole (FEMARA) 2.5 MG tablet, Take 1 tablet (2.5 mg total) by mouth daily. (Patient taking differently: Take 5 mg by mouth daily. ), Disp: 90 tablet, Rfl: 3 .  levocetirizine (XYZAL) 5 MG tablet, Take 5 mg by mouth every evening. , Disp: , Rfl:  .  levocetirizine (XYZAL) 5 MG tablet, Take 5 mg by mouth daily., Disp: , Rfl:  .  lisinopril (PRINIVIL,ZESTRIL) 5 MG tablet, Take 1 tablet (5 mg  total) by mouth daily., Disp: 90 tablet, Rfl: 3 .  loperamide (IMODIUM) 2 MG capsule, Take 2-4 mg by mouth as needed for diarrhea or loose stools. Reported on 04/05/2016, Disp: , Rfl:  .  metoCLOPramide (REGLAN) 10 MG tablet, Take 1 tablet (10 mg total) by mouth 4 (four) times daily -  before meals and at bedtime., Disp: 360 tablet, Rfl: 1 .  metoprolol succinate (TOPROL-XL) 25 MG 24 hr tablet, Take 12.5 mg by mouth daily. , Disp: , Rfl:  .  mycophenolate (CELLCEPT) 500 MG tablet, Take 1,000 mg by mouth 2 (two) times daily., Disp: , Rfl:  .  Nutritional Supplements (FEEDING SUPPLEMENT, JEVITY 1.5 CAL,) LIQD, Place 237  mLs into feeding tube continuous., Disp: , Rfl:  .  ondansetron (ZOFRAN-ODT) 4 MG disintegrating tablet, Take 4 mg by mouth every 4 (four) hours as needed for nausea or vomiting., Disp: , Rfl:  .  oxyCODONE-acetaminophen (PERCOCET/ROXICET) 5-325 MG tablet, Take 1 tablet by mouth 2 (two) times daily as needed for severe pain. May refill on or after November 19 2016, Disp: 60 tablet, Rfl: 0 .  OXYGEN, Inhale 2 mLs into the lungs at bedtime., Disp: , Rfl:  .  pantoprazole (PROTONIX) 40 MG tablet, Take 40 mg by mouth 2 (two) times daily., Disp: , Rfl:  .  Polyvinyl Alcohol-Povidone (REFRESH OP), Place 2 drops into both eyes daily as needed (dry eyes). , Disp: , Rfl:  .  predniSONE (DELTASONE) 5 MG tablet, Take 5 mg by mouth daily. , Disp: , Rfl:  .  sterile water for irrigation 237 mL, Give 2.5 mL/hr by tube continuous., Disp: , Rfl:  .  sucralfate (CARAFATE) 1 GM/10ML suspension, Take 1 g by mouth 2 (two) times daily., Disp: , Rfl:  .  SYNTHROID 175 MCG tablet, TAKE 1 TABLET (175 MCG TOTAL) BY MOUTH DAILY BEFORE BREAKFAST., Disp: 90 tablet, Rfl: 1 .  Tadalafil, PAH, (ADCIRCA) 20 MG TABS, Take 40 mg by mouth daily., Disp: , Rfl:  .  valganciclovir (VALCYTE) 50 MG/ML SOLR, Take 9 mLs (450 mg total) by mouth daily., Disp: 270 mL, Rfl: 1 .  warfarin (COUMADIN) 5 MG tablet, Take 1 tablet (5 mg  total) by mouth daily. 5 days per week 3 mg the other days, Disp: 30 tablet, Rfl: 3 .  Water For Irrigation, Sterile (FREE WATER) SOLN, Place 150 mLs into feeding tube 2 (two) times daily., Disp: 1500 mL, Rfl: 2 .  benztropine (COGENTIN) 2 MG tablet, Take 1,000 mg by mouth., Disp: , Rfl:  .  warfarin (COUMADIN) 3 MG tablet, Take 1 tablet (3 mg total) by mouth daily. Two days per week, 5 mg all other day s (Patient not taking: Reported on 11/10/2016), Disp: 30 tablet, Rfl: 1  Physical exam:  Vitals:   11/10/16 1451  BP: 117/70  Pulse: 71  Temp: 98.6 F (37 C)  TempSrc: Tympanic  Weight: 132 lb 7.9 oz (60.1 kg)   Physical Exam  Constitutional: She is oriented to person, place, and time and well-developed, well-nourished, and in no distress.  HENT:  Head: Normocephalic and atraumatic.  Eyes: EOM are normal. Pupils are equal, round, and reactive to light.  Neck: Normal range of motion.  Cardiovascular: Normal rate, regular rhythm and normal heart sounds.   Pulmonary/Chest: Effort normal and breath sounds normal.  Abdominal: Soft. Bowel sounds are normal.  Neurological: She is alert and oriented to person, place, and time.  Skin: Skin is warm and dry.   breast exam was performed in sitting position as patient could not lie down.There is no evidence of bilateral axillary adenopathy. Scattered fibroglandularity noted in the left breast. No palpable masses in either breast.  CMP Latest Ref Rng & Units 11/10/2016  Glucose 65 - 99 mg/dL 161(W)  BUN 6 - 20 mg/dL 96(E)  Creatinine 4.54 - 1.00 mg/dL 0.98(J)  Sodium 191 - 478 mmol/L 136  Potassium 3.5 - 5.1 mmol/L 4.6  Chloride 101 - 111 mmol/L 105  CO2 22 - 32 mmol/L 25  Calcium 8.9 - 10.3 mg/dL 9.2  Total Protein 6.5 - 8.1 g/dL 7.1  Total Bilirubin 0.3 - 1.2 mg/dL 0.5  Alkaline Phos 38 - 126 U/L 67  AST 15 - 41 U/L 25  ALT 14 - 54 U/L 13(L)   CBC Latest Ref Rng & Units 11/10/2016  WBC 3.6 - 11.0 K/uL 6.8  Hemoglobin 12.0 - 16.0 g/dL  10.6(L)  Hematocrit 35.0 - 47.0 % 32.3(L)  Platelets 150 - 440 K/uL 381      Assessment and plan- Patient is a 82 y.o. female sees [place] for following medical issues:  1. History of stage I left breast cancer- recent mammogram from January 2018 did not reveal any evidence of malignancy. We will repeat another mammogram in one year's time. Currently patient is taking letrozole and she is also on chronic steroids both can worsen her bone health. Patient tells me that she did have pre-existing osteopenia on her initial DEXA scan. We will repeat another bone density scan at this time. If her osteoporosis is worse we may have to consider stopping her left resolved. She cannot be switched to tamoxifen given her history of recurrent DVTs in the past. I again discussed the option of trying Prolia for osteopenia/osteoporosis with the patient does not want to up for that at this time  2. Macrocytic anemia likely secondary to underlying MDS as well as is a component of anemia of chronic kidney disease- at this time her hemoglobin is more than 10 and there would be no indication for EPO shots at this time.  I will see her back in 6 months time with a repeat CBC and BMP   Visit Diagnosis 1. Malignant neoplasm of upper-outer quadrant of left female breast, unspecified estrogen receptor status (HCC)      Dr. Owens Shark, MD, MPH [facility] Pager- [phone] 11/10/2016 3:25 PM

## 2016-11-10 NOTE — Progress Notes (Signed)
Patient ambulates without assistance, brought to exam room 5.  Patient denies pain or discomfort at this time, vitals stable and documented

## 2016-11-11 ENCOUNTER — Telehealth: Payer: Self-pay | Admitting: *Deleted

## 2016-11-14 ENCOUNTER — Telehealth: Payer: Self-pay | Admitting: *Deleted

## 2016-11-14 NOTE — Telephone Encounter (Signed)
Called patient to let her know we changed her Bone Density to 11-17-2016 at 10:30 am. Location: [facility]. Patient confirmed appt. Initial appt was 11/16/16 but patient had 2 MD appts that day.

## 2016-11-16 ENCOUNTER — Other Ambulatory Visit (INDEPENDENT_AMBULATORY_CARE_PROVIDER_SITE_OTHER): Payer: Medicare Other

## 2016-11-16 DIAGNOSIS — Z7901 Long term (current) use of anticoagulants: Secondary | ICD-10-CM | POA: Diagnosis not present

## 2016-11-16 DIAGNOSIS — Z931 Gastrostomy status: Secondary | ICD-10-CM | POA: Diagnosis not present

## 2016-11-16 DIAGNOSIS — B258 Other cytomegaloviral diseases: Secondary | ICD-10-CM | POA: Diagnosis not present

## 2016-11-16 DIAGNOSIS — K5909 Other constipation: Secondary | ICD-10-CM | POA: Diagnosis not present

## 2016-11-16 DIAGNOSIS — K208 Other esophagitis: Secondary | ICD-10-CM | POA: Diagnosis not present

## 2016-11-16 DIAGNOSIS — K210 Gastro-esophageal reflux disease with esophagitis: Secondary | ICD-10-CM | POA: Diagnosis not present

## 2016-11-16 LAB — PROTIME-INR
INR: 2.5 ratio — ABNORMAL HIGH (ref 0.8–1.0)
PROTHROMBIN TIME: 27.4 s — AB (ref 9.6–13.1)

## 2016-11-17 ENCOUNTER — Encounter: Payer: Self-pay | Admitting: Oncology

## 2016-11-17 DIAGNOSIS — M85831 Other specified disorders of bone density and structure, right forearm: Secondary | ICD-10-CM | POA: Diagnosis not present

## 2016-11-17 DIAGNOSIS — Z79899 Other long term (current) drug therapy: Secondary | ICD-10-CM | POA: Diagnosis not present

## 2016-11-17 DIAGNOSIS — M810 Age-related osteoporosis without current pathological fracture: Secondary | ICD-10-CM | POA: Diagnosis not present

## 2016-11-17 DIAGNOSIS — C50412 Malignant neoplasm of upper-outer quadrant of left female breast: Secondary | ICD-10-CM | POA: Diagnosis not present

## 2016-11-17 DIAGNOSIS — M8588 Other specified disorders of bone density and structure, other site: Secondary | ICD-10-CM | POA: Diagnosis not present

## 2016-11-17 DIAGNOSIS — Z780 Asymptomatic menopausal state: Secondary | ICD-10-CM | POA: Diagnosis not present

## 2016-11-17 DIAGNOSIS — M8589 Other specified disorders of bone density and structure, multiple sites: Secondary | ICD-10-CM | POA: Diagnosis not present

## 2016-11-24 DIAGNOSIS — H35372 Puckering of macula, left eye: Secondary | ICD-10-CM | POA: Diagnosis not present

## 2016-11-24 LAB — HM DIABETES EYE EXAM

## 2016-12-05 ENCOUNTER — Telehealth: Payer: Self-pay | Admitting: Internal Medicine

## 2016-12-05 DIAGNOSIS — K209 Esophagitis, unspecified without bleeding: Secondary | ICD-10-CM

## 2016-12-05 NOTE — Telephone Encounter (Signed)
Please advise 

## 2016-12-05 NOTE — Telephone Encounter (Signed)
Pt called and stated that she needs a refill on Nutritional Supplements (FEEDING SUPPLEMENT, JEVITY 1.5 CAL,) LIQD and that she needs a new rx for her Intral pump set joey (feeding bag). Please advise, thank you!  Pharmacy - [facility] phone - [phone]

## 2016-12-06 MED ORDER — JEVITY 1.5 CAL PO LIQD: 237.0000 mL | ORAL | 11 refills | Status: DC

## 2016-12-06 NOTE — Telephone Encounter (Signed)
Spoke with Janett Billow at [facility] and she stated that the pt already has an order for both the nutritional supplements and the feeding bags. Janett Billow stated that she would just call the pt and get a verbal from the pt that it is really what the pt is needing. She stated that we would not need to fax anything that she has everything she needs to place the order.

## 2016-12-06 NOTE — Telephone Encounter (Signed)
I believe what she meant is the "enteral pump set up. " Please find out from her home health agency where the two rx's  Need to be sent and prin the out and fax, thanks

## 2016-12-08 ENCOUNTER — Ambulatory Visit (INDEPENDENT_AMBULATORY_CARE_PROVIDER_SITE_OTHER): Payer: Medicare Other | Admitting: Family

## 2016-12-08 ENCOUNTER — Encounter: Payer: Self-pay | Admitting: Family

## 2016-12-08 VITALS — BP 110/76 | HR 92 | Temp 97.9°F | Resp 14 | Wt 126.2 lb

## 2016-12-08 DIAGNOSIS — B029 Zoster without complications: Secondary | ICD-10-CM

## 2016-12-08 MED ORDER — ACYCLOVIR 800 MG PO TABS: 800.0000 mg | ORAL_TABLET | Freq: Every day | ORAL | 0 refills | Status: DC

## 2016-12-08 NOTE — Patient Instructions (Signed)
Start acyclovir  Follow up next in the office  Let us know if worsens   Shingles Shingles, which is also known as herpes zoster, is an infection that causes a painful skin rash and fluid-filled blisters. Shingles is not related to genital herpes, which is a sexually transmitted infection. Shingles only develops in people who:  Have had chickenpox.  Have received the chickenpox vaccine. (This is rare.) What are the causes? Shingles is caused by varicella-zoster virus (VZV). This is the same virus that causes chickenpox. After exposure to VZV, the virus stays in the body in an inactive (dormant) state. Shingles develops if the virus reactivates. This can happen many years after the initial exposure to VZV. It is not known what causes this virus to reactivate. What increases the risk? People who have had chickenpox or received the chickenpox vaccine are at risk for shingles. Infection is more common in people who:  Are older than age 100.  Have a weakened defense (immune) system, such as those with HIV, AIDS, or cancer.  Are taking medicines that weaken the immune system, such as transplant medicines.  Are under great stress. What are the signs or symptoms? Early symptoms of this condition include itching, tingling, and pain in an area on your skin. Pain may be described as burning, stabbing, or throbbing. A few days or weeks after symptoms start, a painful red rash appears, usually on one side of the body in a bandlike or beltlike pattern. The rash eventually turns into fluid-filled blisters that break open, scab over, and dry up in about 2-3 weeks. At any time during the infection, you may also develop:  A fever.  Chills.  A headache.  An upset stomach. How is this diagnosed? This condition is diagnosed with a skin exam. Sometimes, skin or fluid samples are taken from the blisters before a diagnosis is made. These samples are examined under a microscope or sent to a lab for  testing. How is this treated? There is no specific cure for this condition. Your health care provider will probably prescribe medicines to help you manage pain, recover more quickly, and avoid long-term problems. Medicines may include:  Antiviral drugs.  Anti-inflammatory drugs.  Pain medicines. If the area involved is on your face, you may be referred to a specialist, such as an eye doctor (ophthalmologist) or an ear, nose, and throat (ENT) doctor to help you avoid eye problems, chronic pain, or disability. Follow these instructions at home: Medicines   Take medicines only as directed by your health care provider.  Apply an anti-itch or numbing cream to the affected area as directed by your health care provider. Blister and Rash Care   Take a cool bath or apply cool compresses to the area of the rash or blisters as directed by your health care provider. This may help with pain and itching.  Keep your rash covered with a loose bandage (dressing). Wear loose-fitting clothing to help ease the pain of material rubbing against the rash.  Keep your rash and blisters clean with mild soap and cool water or as directed by your health care provider.  Check your rash every day for signs of infection. These include redness, swelling, and pain that lasts or increases.  Do not pick your blisters.  Do not scratch your rash. General instructions   Rest as directed by your health care provider.  Keep all follow-up visits as directed by your health care provider. This is important.  Until your blisters scab  over, your infection can cause chickenpox in people who have never had it or been vaccinated against it. To prevent this from happening, avoid contact with other people, especially:  Babies.  Pregnant women.  Children who have eczema.  Elderly people who have transplants.  People who have chronic illnesses, such as leukemia or AIDS. Contact a health care provider if:  Your pain is  not relieved with prescribed medicines.  Your pain does not get better after the rash heals.  Your rash looks infected. Signs of infection include redness, swelling, and pain that lasts or increases. Get help right away if:  The rash is on your face or nose.  You have facial pain, pain around your eye area, or loss of feeling on one side of your face.  You have ear pain or you have ringing in your ear.  You have loss of taste.  Your condition gets worse. This information is not intended to replace advice given to you by your health care provider. Make sure you discuss any questions you have with your health care provider. Document Released: 09/05/2005 Document Revised: 05/01/2016 Document Reviewed: 07/17/2014 Elsevier Interactive Patient Education  2017 [employer].

## 2016-12-08 NOTE — Progress Notes (Signed)
Subjective:    Patient ID: Diana [name], female    DOB: Jul 29, 1935, 82 y.o.   MRN: [id number]  CC: Diana [name] is a 82 y.o. female who presents today for an acute visit.    HPI: Noticed 'tingle' on right buttocks 3 days ago. Rash erupted and now very painful. Has been sitting on it due to location which makes it more painful. Hasn't tried any medication.    h/o zoster. Years ago was hospitalized as it has went down her leg.      HISTORY:  Past Medical History:  Diagnosis Date  . Acute glomerulonephritis with other specified pathological lesion in kidney in disease classified elsewhere(580.81)   . Alveolar aeration decreased   . Anemia   . Arthritis   . Atrial fibrillation (HCC)   . Breast cancer (HCC) 06/03/2014   positive, radiation  . Cancer (HCC)    Breast  . CHF (congestive heart failure) (HCC)   . Diaphragmatic hernia   . DVT (deep venous thrombosis) (HCC)   . Dysrhythmia   . Environmental allergies   . Esophageal reflux   . Feeding problem    FEEDING TUBE  . Hiatal hernia   . Hyperlipidemia   . Hypertension   . Lower extremity edema   . Lupus (systemic lupus erythematosus) (HCC)   . Neuropathy (HCC)   . Osteopenia   . Oxygen deficiency    USES HS  . Peripheral neuropathy, hereditary/idiopathic   . Pneumonia   . Primary pulmonary hypertension (HCC)   . Pulmonary hypertension   . Renal insufficiency   . Sciatica   . Shingles   . Shingles (herpes zoster) polyneuropathy March 2013   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  . Shortness of breath dyspnea   . Unspecified hypothyroidism   . Unspecified menopausal and postmenopausal disorder   . Urinary incontinence without sensory awareness   . Vitamin B deficiency    Past Surgical History:  Procedure Laterality Date  . BREAST EXCISIONAL BIOPSY Left 06/03/2014   positive  . BREAST SURGERY    . CATARACT EXTRACTION W/PHACO Right 04/05/2016   Procedure: CATARACT EXTRACTION PHACO AND INTRAOCULAR LENS  PLACEMENT (IOC);  Surgeon: Galen Manila, MD;  Location: [facility] ORS;  Service: Ophthalmology;  Laterality: Right;  [place] 01:20 AP% 23.9 CDE 19.29 fluid pack lot # [id number]  . CATARACT EXTRACTION W/PHACO Left 04/26/2016   Procedure: CATARACT EXTRACTION PHACO AND INTRAOCULAR LENS PLACEMENT (IOC);  Surgeon: Galen Manila, MD;  Location: [facility] ORS;  Service: Ophthalmology;  Laterality: Left;   [place] 00:59 AP% 23.2 CDE 13.83 Fluid pack lot # [id number]  . DIAGNOSTIC LAPAROSCOPY    . ESOPHAGOGASTRODUODENOSCOPY N/A 07/19/2015   Procedure: ESOPHAGOGASTRODUODENOSCOPY (EGD);  Surgeon: Wallace Cullens, MD;  Location: [facility] ENDOSCOPY;  Service: Gastroenterology;  Laterality: N/A;  . ESOPHAGOGASTRODUODENOSCOPY (EGD) WITH PROPOFOL N/A 03/28/2015   Procedure: ESOPHAGOGASTRODUODENOSCOPY (EGD) WITH PROPOFOL;  Surgeon: Earline Mayotte, MD;  Location: [facility] ENDOSCOPY;  Service: Endoscopy;  Laterality: N/A;  . ESOPHAGOGASTRODUODENOSCOPY (EGD) WITH PROPOFOL N/A 06/10/2015   Procedure: ESOPHAGOGASTRODUODENOSCOPY (EGD) WITH PROPOFOL;  Surgeon: Christena Deem, MD;  Location: [facility] ENDOSCOPY;  Service: Endoscopy;  Laterality: N/A;  . ESOPHAGOGASTRODUODENOSCOPY (EGD) WITH PROPOFOL N/A 10/20/2015   Procedure: ESOPHAGOGASTRODUODENOSCOPY (EGD) WITH PROPOFOL;  Surgeon: Christena Deem, MD;  Location: [facility] ENDOSCOPY;  Service: Endoscopy;  Laterality: N/A;  . ESOPHAGOGASTRODUODENOSCOPY (EGD) WITH PROPOFOL N/A 02/23/2016   Procedure: ESOPHAGOGASTRODUODENOSCOPY (EGD) WITH PROPOFOL;  Surgeon: Christena Deem, MD;  Location: [facility] ENDOSCOPY;  Service: Endoscopy;  Laterality: N/A;  . ESOPHAGOGASTRODUODENOSCOPY (EGD) WITH PROPOFOL N/A 06/30/2016   Procedure: ESOPHAGOGASTRODUODENOSCOPY (EGD) WITH PROPOFOL;  Surgeon: Christena Deem, MD;  Location: [facility] ENDOSCOPY;  Service: Endoscopy;  Laterality: N/A;  . KYPHOPLASTY N/A 02/10/2015   Procedure: [id number]/J8;  Surgeon: Kennedy Bucker, MD;  Location: [facility] ORS;  Service: Orthopedics;  Laterality:  N/A;  . LAPAROTOMY N/A 03/11/2015   Procedure: REDUCTION OF GASTRIC VOLVULUS, SPLEENECTOMY, GASTRIC TUBE PLACEMENT;  Surgeon: Earline Mayotte, MD;  Location: [facility] ORS;  Service: General;  Laterality: N/A;  . MOHS SURGERY    . PERIPHERAL VASCULAR CATHETERIZATION N/A 06/12/2015   Procedure: IVC Filter Insertion;  Surgeon: Annice Needy, MD;  Location: [facility] INVASIVE CV LAB;  Service: Cardiovascular;  Laterality: N/A;  . PERIPHERAL VASCULAR CATHETERIZATION N/A 08/23/2016   Procedure: IVC Filter Removal;  Surgeon: Renford Dills, MD;  Location: [facility] INVASIVE CV LAB;  Service: Cardiovascular;  Laterality: N/A;  . STOMACH SURGERY     for volvolus   Family History  Problem Relation Age of Onset  . Colon cancer Mother   . Alcohol abuse Sister   . Breast cancer Sister 16  . Cancer Brother   . Lung cancer Son     Allergies: Amoxicillin; Cefuroxime; Contrast media [iodinated diagnostic agents]; Metrizamide; Morphine and related; Propoxyphene; Cephalexin; Sulfa antibiotics; Sulfonamide derivatives; Tramadol hcl; and Venlafaxine Current Outpatient Prescriptions on File Prior to Visit  Medication Sig Dispense Refill  . acetaminophen (TYLENOL) 325 MG tablet Take 650 mg by mouth every 4 (four) hours as needed for mild pain or fever. Reported on 02/23/2016    . ALPRAZolam (XANAX) 0.5 MG tablet Take 0.5 mg by mouth at bedtime as needed for anxiety. Reported on 04/05/2016    . benztropine (COGENTIN) 2 MG tablet Take 1,000 mg by mouth.    . Calcium Carbonate-Vitamin D 600-400 MG-UNIT tablet Take 1 tablet by mouth daily.    . cyanocobalamin (,VITAMIN B-12,) 1000 MCG/ML injection Inject 1 mL (1,000 mcg total) into the muscle every 30 (thirty) days. Pt uses on the 1st of every month. 10 mL 1  . hydroxychloroquine (PLAQUENIL) 200 MG tablet Take 200 mg by mouth daily.    Marland Kitchen letrozole (FEMARA) 2.5 MG tablet Take 1 tablet (2.5 mg total) by mouth daily. (Patient taking differently: Take 5 mg by mouth daily. ) 90 tablet  3  . levocetirizine (XYZAL) 5 MG tablet Take 5 mg by mouth every evening.     Marland Kitchen levocetirizine (XYZAL) 5 MG tablet Take 5 mg by mouth daily.    Marland Kitchen lisinopril (PRINIVIL,ZESTRIL) 5 MG tablet Take 1 tablet (5 mg total) by mouth daily. 90 tablet 3  . loperamide (IMODIUM) 2 MG capsule Take 2-4 mg by mouth as needed for diarrhea or loose stools. Reported on 04/05/2016    . metoCLOPramide (REGLAN) 10 MG tablet Take 1 tablet (10 mg total) by mouth 4 (four) times daily -  before meals and at bedtime. 360 tablet 1  . metoprolol succinate (TOPROL-XL) 25 MG 24 hr tablet Take 12.5 mg by mouth daily.     . mycophenolate (CELLCEPT) 500 MG tablet Take 1,000 mg by mouth 2 (two) times daily.    . Nutritional Supplements (FEEDING SUPPLEMENT, JEVITY 1.5 CAL,) LIQD Place 237 mLs into feeding tube continuous. 1000 mL 11  . ondansetron (ZOFRAN-ODT) 4 MG disintegrating tablet Take 4 mg by mouth every 4 (four) hours as needed for nausea or vomiting.    Marland Kitchen oxyCODONE-acetaminophen (PERCOCET/ROXICET) 5-325 MG tablet Take 1 tablet by  mouth 2 (two) times daily as needed for severe pain. May refill on or after November 19 2016 60 tablet 0  . OXYGEN Inhale 2 mLs into the lungs at bedtime.    . pantoprazole (PROTONIX) 40 MG tablet Take 40 mg by mouth 2 (two) times daily.    . Polyvinyl Alcohol-Povidone (REFRESH OP) Place 2 drops into both eyes daily as needed (dry eyes).     . predniSONE (DELTASONE) 5 MG tablet Take 5 mg by mouth daily.     Marland Kitchen sterile water for irrigation 237 mL Give 2.5 mL/hr by tube continuous.    . sucralfate (CARAFATE) 1 GM/10ML suspension Take 1 g by mouth 2 (two) times daily.    Marland Kitchen SYNTHROID 175 MCG tablet TAKE 1 TABLET (175 MCG TOTAL) BY MOUTH DAILY BEFORE BREAKFAST. 90 tablet 1  . Tadalafil, PAH, (ADCIRCA) 20 MG TABS Take 40 mg by mouth daily.    Marland Kitchen warfarin (COUMADIN) 3 MG tablet Take 1 tablet (3 mg total) by mouth daily. Two days per week, 5 mg all other day s (Patient not taking: Reported on 11/10/2016) 30  tablet 1  . warfarin (COUMADIN) 5 MG tablet Take 1 tablet (5 mg total) by mouth daily. 5 days per week 3 mg the other days 30 tablet 3  . Water For Irrigation, Sterile (FREE WATER) SOLN Place 150 mLs into feeding tube 2 (two) times daily. 1500 mL 2   No current facility-administered medications on file prior to visit.     Social History  Substance Use Topics  . Smoking status: Former Smoker    Quit date: 05/25/1991  . Smokeless tobacco: Never Used  . Alcohol use Yes     Comment: rare    Review of Systems  Constitutional: Negative for chills and fever.  Respiratory: Negative for cough.   Cardiovascular: Negative for chest pain and palpitations.  Gastrointestinal: Negative for nausea and vomiting.  Skin: Positive for rash.      Objective:    BP 110/76 (BP Location: Right Arm, Patient Position: Sitting, Cuff Size: Normal)   Pulse 92   Temp 97.9 F (36.6 C) (Oral)   Resp 14   Wt 126 lb 4 oz (57.3 kg)   SpO2 95%   BMI 23.09 kg/m    Physical Exam  Constitutional: She appears well-developed and well-nourished.  Eyes: Conjunctivae are normal.  Cardiovascular: Normal rate, regular rhythm, normal heart sounds and normal pulses.   Pulmonary/Chest: Effort normal and breath sounds normal. She has no wheezes. She has no rhonchi. She has no rales.  Neurological: She is alert.  Skin: Skin is warm and dry. Rash noted. Rash is vesicular.     Erythematous vesicular lesions noted right buttocks. No drainage, streaking.   Psychiatric: She has a normal mood and affect. Her speech is normal and behavior is normal. Thought content normal.  Vitals reviewed.      Assessment & Plan:   1. Herpes zoster without complication Symptoms and history supports zoster. Local infection at this time. Due to age and prior h/o zoster complication subsequent hospitalization from dissemination 2013, advised patient very close vigilant and follow up appointment early next week. She will let us know how she  is doing.    - acyclovir (ZOVIRAX) 800 MG tablet; Take 1 tablet (800 mg total) by mouth 5 (five) times daily.  Dispense: 35 tablet; Refill: 0   I have discontinued Diana [name]'s valganciclovir. I am also having her maintain her hydroxychloroquine, tadalafil (PAH), acetaminophen, loperamide,  levocetirizine, predniSONE, free water, metoprolol succinate, cyanocobalamin, mycophenolate, Polyvinyl Alcohol-Povidone (REFRESH OP), OXYGEN, ondansetron, ALPRAZolam, sterile water for irrigation 237 mL, metoCLOPramide, lisinopril, letrozole, Calcium Carbonate-Vitamin D, pantoprazole, sucralfate, oxyCODONE-acetaminophen, warfarin, warfarin, SYNTHROID, benztropine, levocetirizine, and feeding supplement (JEVITY 1.5 CAL).   No orders of the defined types were placed in this encounter.   Return precautions given.   Risks, benefits, and alternatives of the medications and treatment plan prescribed today were discussed, and patient expressed understanding.   Education regarding symptom management and diagnosis given to patient on AVS.  Continue to follow with TULLO, Mar Daring, MD for routine health maintenance.   Diana [name] and I agreed with plan.   Rennie Plowman, FNP

## 2016-12-08 NOTE — Progress Notes (Signed)
Pre visit review using our clinic review tool, if applicable. No additional management support is needed unless otherwise documented below in the visit note. 

## 2016-12-14 ENCOUNTER — Ambulatory Visit (INDEPENDENT_AMBULATORY_CARE_PROVIDER_SITE_OTHER): Payer: Medicare Other | Admitting: Family

## 2016-12-14 ENCOUNTER — Encounter: Payer: Self-pay | Admitting: Family

## 2016-12-14 VITALS — BP 118/60 | HR 91 | Temp 98.2°F | Ht 62.0 in | Wt 129.0 lb

## 2016-12-14 DIAGNOSIS — B029 Zoster without complications: Secondary | ICD-10-CM | POA: Diagnosis not present

## 2016-12-14 NOTE — Patient Instructions (Addendum)
Glad you are feeling better.  Let me know if you do not continue to do so.   I would advise Desitin - or some barrier cream with zinc - as you may also have a contact dermatitis based on where your shingles presented.

## 2016-12-14 NOTE — Progress Notes (Signed)
Pre visit review using our clinic review tool, if applicable. No additional management support is needed unless otherwise documented below in the visit note. 

## 2016-12-14 NOTE — Progress Notes (Signed)
Subjective:    Patient ID: Diana [name], female    DOB: Mar 23, 1935, 82 y.o.   MRN: [id number]  CC: Diana [name] is a 82 y.o. female who presents today for follow up.   HPI: Follow-up for suspected zoster rash on left buttocks. Started on acyclovir 6 days ago.  Rash has improved. Overall feels well and pain has subsided. No fever, chills, N, V.   History of disseminated zoster and hospitalization from complications.    HISTORY:  Past Medical History:  Diagnosis Date  . Acute glomerulonephritis with other specified pathological lesion in kidney in disease classified elsewhere(580.81)   . Alveolar aeration decreased   . Anemia   . Arthritis   . Atrial fibrillation (HCC)   . Breast cancer (HCC) 06/03/2014   positive, radiation  . Cancer (HCC)    Breast  . CHF (congestive heart failure) (HCC)   . Diaphragmatic hernia   . DVT (deep venous thrombosis) (HCC)   . Dysrhythmia   . Environmental allergies   . Esophageal reflux   . Feeding problem    FEEDING TUBE  . Hiatal hernia   . Hyperlipidemia   . Hypertension   . Lower extremity edema   . Lupus (systemic lupus erythematosus) (HCC)   . Neuropathy (HCC)   . Osteopenia   . Oxygen deficiency    USES HS  . Peripheral neuropathy, hereditary/idiopathic   . Pneumonia   . Primary pulmonary hypertension (HCC)   . Pulmonary hypertension   . Renal insufficiency   . Sciatica   . Shingles   . Shingles (herpes zoster) polyneuropathy March 2013   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  . Shortness of breath dyspnea   . Unspecified hypothyroidism   . Unspecified menopausal and postmenopausal disorder   . Urinary incontinence without sensory awareness   . Vitamin B deficiency    Past Surgical History:  Procedure Laterality Date  . BREAST EXCISIONAL BIOPSY Left 06/03/2014   positive  . BREAST SURGERY    . CATARACT EXTRACTION W/PHACO Right 04/05/2016   Procedure: CATARACT EXTRACTION PHACO AND INTRAOCULAR LENS PLACEMENT (IOC);   Surgeon: Galen Manila, MD;  Location: [facility] ORS;  Service: Ophthalmology;  Laterality: Right;  [place] 01:20 AP% 23.9 CDE 19.29 fluid pack lot # [id number]  . CATARACT EXTRACTION W/PHACO Left 04/26/2016   Procedure: CATARACT EXTRACTION PHACO AND INTRAOCULAR LENS PLACEMENT (IOC);  Surgeon: Galen Manila, MD;  Location: [facility] ORS;  Service: Ophthalmology;  Laterality: Left;   [place] 00:59 AP% 23.2 CDE 13.83 Fluid pack lot # [id number]  . DIAGNOSTIC LAPAROSCOPY    . ESOPHAGOGASTRODUODENOSCOPY N/A 07/19/2015   Procedure: ESOPHAGOGASTRODUODENOSCOPY (EGD);  Surgeon: Wallace Cullens, MD;  Location: [facility] ENDOSCOPY;  Service: Gastroenterology;  Laterality: N/A;  . ESOPHAGOGASTRODUODENOSCOPY (EGD) WITH PROPOFOL N/A 03/28/2015   Procedure: ESOPHAGOGASTRODUODENOSCOPY (EGD) WITH PROPOFOL;  Surgeon: Earline Mayotte, MD;  Location: [facility] ENDOSCOPY;  Service: Endoscopy;  Laterality: N/A;  . ESOPHAGOGASTRODUODENOSCOPY (EGD) WITH PROPOFOL N/A 06/10/2015   Procedure: ESOPHAGOGASTRODUODENOSCOPY (EGD) WITH PROPOFOL;  Surgeon: Christena Deem, MD;  Location: [facility] ENDOSCOPY;  Service: Endoscopy;  Laterality: N/A;  . ESOPHAGOGASTRODUODENOSCOPY (EGD) WITH PROPOFOL N/A 10/20/2015   Procedure: ESOPHAGOGASTRODUODENOSCOPY (EGD) WITH PROPOFOL;  Surgeon: Christena Deem, MD;  Location: [facility] ENDOSCOPY;  Service: Endoscopy;  Laterality: N/A;  . ESOPHAGOGASTRODUODENOSCOPY (EGD) WITH PROPOFOL N/A 02/23/2016   Procedure: ESOPHAGOGASTRODUODENOSCOPY (EGD) WITH PROPOFOL;  Surgeon: Christena Deem, MD;  Location: [facility] ENDOSCOPY;  Service: Endoscopy;  Laterality: N/A;  . ESOPHAGOGASTRODUODENOSCOPY (EGD) WITH PROPOFOL N/A 06/30/2016  Procedure: ESOPHAGOGASTRODUODENOSCOPY (EGD) WITH PROPOFOL;  Surgeon: Christena Deem, MD;  Location: [facility] ENDOSCOPY;  Service: Endoscopy;  Laterality: N/A;  . KYPHOPLASTY N/A 02/10/2015   Procedure: [id number]/C1;  Surgeon: Kennedy Bucker, MD;  Location: [facility] ORS;  Service: Orthopedics;  Laterality: N/A;  . LAPAROTOMY  N/A 03/11/2015   Procedure: REDUCTION OF GASTRIC VOLVULUS, SPLEENECTOMY, GASTRIC TUBE PLACEMENT;  Surgeon: Earline Mayotte, MD;  Location: [facility] ORS;  Service: General;  Laterality: N/A;  . MOHS SURGERY    . PERIPHERAL VASCULAR CATHETERIZATION N/A 06/12/2015   Procedure: IVC Filter Insertion;  Surgeon: Annice Needy, MD;  Location: [facility] INVASIVE CV LAB;  Service: Cardiovascular;  Laterality: N/A;  . PERIPHERAL VASCULAR CATHETERIZATION N/A 08/23/2016   Procedure: IVC Filter Removal;  Surgeon: Renford Dills, MD;  Location: [facility] INVASIVE CV LAB;  Service: Cardiovascular;  Laterality: N/A;  . STOMACH SURGERY     for volvolus   Family History  Problem Relation Age of Onset  . Colon cancer Mother   . Alcohol abuse Sister   . Breast cancer Sister 23  . Cancer Brother   . Lung cancer Son     Allergies: Amoxicillin; Cefuroxime; Contrast media [iodinated diagnostic agents]; Metrizamide; Morphine and related; Propoxyphene; Cephalexin; Sulfa antibiotics; Sulfonamide derivatives; Tramadol hcl; and Venlafaxine Current Outpatient Prescriptions on File Prior to Visit  Medication Sig Dispense Refill  . acetaminophen (TYLENOL) 325 MG tablet Take 650 mg by mouth every 4 (four) hours as needed for mild pain or fever. Reported on 02/23/2016    . acyclovir (ZOVIRAX) 800 MG tablet Take 1 tablet (800 mg total) by mouth 5 (five) times daily. 35 tablet 0  . ALPRAZolam (XANAX) 0.5 MG tablet Take 0.5 mg by mouth at bedtime as needed for anxiety. Reported on 04/05/2016    . Calcium Carbonate-Vitamin D 600-400 MG-UNIT tablet Take 1 tablet by mouth daily.    . cyanocobalamin (,VITAMIN B-12,) 1000 MCG/ML injection Inject 1 mL (1,000 mcg total) into the muscle every 30 (thirty) days. Pt uses on the 1st of every month. 10 mL 1  . hydroxychloroquine (PLAQUENIL) 200 MG tablet Take 200 mg by mouth daily.    Marland Kitchen letrozole (FEMARA) 2.5 MG tablet Take 1 tablet (2.5 mg total) by mouth daily. (Patient taking differently: Take 5 mg by  mouth daily. ) 90 tablet 3  . levocetirizine (XYZAL) 5 MG tablet Take 5 mg by mouth daily.    Marland Kitchen lisinopril (PRINIVIL,ZESTRIL) 5 MG tablet Take 1 tablet (5 mg total) by mouth daily. 90 tablet 3  . loperamide (IMODIUM) 2 MG capsule Take 2-4 mg by mouth as needed for diarrhea or loose stools. Reported on 04/05/2016    . metoCLOPramide (REGLAN) 10 MG tablet Take 1 tablet (10 mg total) by mouth 4 (four) times daily -  before meals and at bedtime. 360 tablet 1  . metoprolol succinate (TOPROL-XL) 25 MG 24 hr tablet Take 12.5 mg by mouth daily.     . mycophenolate (CELLCEPT) 500 MG tablet Take 1,000 mg by mouth 2 (two) times daily.    . Nutritional Supplements (FEEDING SUPPLEMENT, JEVITY 1.5 CAL,) LIQD Place 237 mLs into feeding tube continuous. 1000 mL 11  . ondansetron (ZOFRAN-ODT) 4 MG disintegrating tablet Take 4 mg by mouth every 4 (four) hours as needed for nausea or vomiting.    Marland Kitchen oxyCODONE-acetaminophen (PERCOCET/ROXICET) 5-325 MG tablet Take 1 tablet by mouth 2 (two) times daily as needed for severe pain. May refill on or after November 19 2016 60 tablet  0  . OXYGEN Inhale 2 mLs into the lungs at bedtime.    . pantoprazole (PROTONIX) 40 MG tablet Take 40 mg by mouth 2 (two) times daily.    . Polyvinyl Alcohol-Povidone (REFRESH OP) Place 2 drops into both eyes daily as needed (dry eyes).     . predniSONE (DELTASONE) 5 MG tablet Take 5 mg by mouth daily.     Marland Kitchen sterile water for irrigation 237 mL Give 2.5 mL/hr by tube continuous.    Marland Kitchen SYNTHROID 175 MCG tablet TAKE 1 TABLET (175 MCG TOTAL) BY MOUTH DAILY BEFORE BREAKFAST. 90 tablet 1  . Tadalafil, PAH, (ADCIRCA) 20 MG TABS Take 40 mg by mouth daily.    Marland Kitchen warfarin (COUMADIN) 5 MG tablet Take 1 tablet (5 mg total) by mouth daily. 5 days per week 3 mg the other days (Patient taking differently: Take 5 mg by mouth daily. ) 30 tablet 3  . Water For Irrigation, Sterile (FREE WATER) SOLN Place 150 mLs into feeding tube 2 (two) times daily. 1500 mL 2   No  current facility-administered medications on file prior to visit.     Social History  Substance Use Topics  . Smoking status: Former Smoker    Quit date: 05/25/1991  . Smokeless tobacco: Never Used  . Alcohol use Yes     Comment: rare    Review of Systems  Constitutional: Negative for chills and fever.  Respiratory: Negative for cough.   Cardiovascular: Negative for chest pain and palpitations.  Gastrointestinal: Negative for diarrhea, nausea and vomiting.  Skin: Positive for rash.      Objective:    BP 118/60   Pulse 91   Temp 98.2 F (36.8 C) (Oral)   Ht 5\' 2"  (1.575 m)   Wt 129 lb (58.5 kg)   SpO2 95%   BMI 23.59 kg/m  BP Readings from Last 3 Encounters:  12/14/16 118/60  12/08/16 110/76  11/10/16 117/70   Wt Readings from Last 3 Encounters:  12/14/16 129 lb (58.5 kg)  12/08/16 126 lb 4 oz (57.3 kg)  11/10/16 132 lb 7.9 oz (60.1 kg)    Physical Exam  Constitutional: She appears well-developed and well-nourished.  Eyes: Conjunctivae are normal.  Cardiovascular: Normal rate, regular rhythm, normal heart sounds and normal pulses.   Pulmonary/Chest: Effort normal and breath sounds normal. She has no wheezes. She has no rhonchi. She has no rales.  Neurological: She is alert.  Skin: Skin is warm and dry. Lesion and rash noted.     Macerated flat purple colored macules left buttocks. No drainage or streaking. No increased warmth.   Psychiatric: She has a normal mood and affect. Her speech is normal and behavior is normal. Thought content normal.  Vitals reviewed.      Assessment & Plan:   1. Herpes zoster without complication Symptoms still most consistent with herpes zoster. Very pleased to see patient's pain has improved. No systemic features. Still see macerated  lesions on left buttocks. Patient and I discussed also my concern for a contact dermatitis. I advised her to use a barrier cream such as Desitin. She is follow-up scheduled with her PCP 4/11 which I  advised her to keep to ensure rash continues to resolve.    I have discontinued Ms. Kessner's sucralfate and benztropine. I am also having her maintain her hydroxychloroquine, tadalafil (PAH), acetaminophen, loperamide, predniSONE, free water, metoprolol succinate, cyanocobalamin, mycophenolate, Polyvinyl Alcohol-Povidone (REFRESH OP), OXYGEN, ondansetron, ALPRAZolam, sterile water for irrigation 237 mL, metoCLOPramide, lisinopril,  letrozole, Calcium Carbonate-Vitamin D, pantoprazole, oxyCODONE-acetaminophen, warfarin, SYNTHROID, levocetirizine, feeding supplement (JEVITY 1.5 CAL), and acyclovir.   No orders of the defined types were placed in this encounter.   Return precautions given.   Risks, benefits, and alternatives of the medications and treatment plan prescribed today were discussed, and patient expressed understanding.   Education regarding symptom management and diagnosis given to patient on AVS.  Continue to follow with TULLO, Mar Daring, MD for routine health maintenance.   Diana [name] and I agreed with plan.   Rennie Plowman, FNP

## 2016-12-15 ENCOUNTER — Ambulatory Visit: Payer: Medicare Other | Admitting: Family

## 2016-12-21 ENCOUNTER — Other Ambulatory Visit (INDEPENDENT_AMBULATORY_CARE_PROVIDER_SITE_OTHER): Payer: Medicare Other

## 2016-12-21 DIAGNOSIS — Z7901 Long term (current) use of anticoagulants: Secondary | ICD-10-CM | POA: Diagnosis not present

## 2016-12-21 LAB — PROTIME-INR
INR: 4.4 ratio — ABNORMAL HIGH (ref 0.8–1.0)
PROTHROMBIN TIME: 47.9 s — AB (ref 9.6–13.1)

## 2016-12-22 ENCOUNTER — Other Ambulatory Visit: Payer: Self-pay | Admitting: Internal Medicine

## 2016-12-22 MED ORDER — WARFARIN SODIUM 4 MG PO TABS: 4.0000 mg | ORAL_TABLET | Freq: Every day | ORAL | 0 refills | Status: DC

## 2016-12-23 ENCOUNTER — Other Ambulatory Visit: Payer: Self-pay

## 2016-12-23 MED ORDER — OXYCODONE-ACETAMINOPHEN 5-325 MG PO TABS: 1.0000 | ORAL_TABLET | Freq: Two times a day (BID) | ORAL | 0 refills | Status: DC | PRN

## 2016-12-23 NOTE — Telephone Encounter (Signed)
Patient aware.

## 2016-12-23 NOTE — Telephone Encounter (Signed)
REFILLED FOR PICKUP

## 2016-12-23 NOTE — Telephone Encounter (Signed)
Call Quillian Quince [phone] when ready  Last refill 11/19/16 Last office visit 09/05/17 Nov 12/28/16

## 2016-12-28 ENCOUNTER — Encounter: Payer: Self-pay | Admitting: Internal Medicine

## 2016-12-28 ENCOUNTER — Ambulatory Visit (INDEPENDENT_AMBULATORY_CARE_PROVIDER_SITE_OTHER): Payer: Medicare Other | Admitting: Internal Medicine

## 2016-12-28 DIAGNOSIS — D518 Other vitamin B12 deficiency anemias: Secondary | ICD-10-CM

## 2016-12-28 DIAGNOSIS — E440 Moderate protein-calorie malnutrition: Secondary | ICD-10-CM

## 2016-12-28 DIAGNOSIS — D500 Iron deficiency anemia secondary to blood loss (chronic): Secondary | ICD-10-CM | POA: Diagnosis not present

## 2016-12-28 DIAGNOSIS — Z5181 Encounter for therapeutic drug level monitoring: Secondary | ICD-10-CM

## 2016-12-28 DIAGNOSIS — M5430 Sciatica, unspecified side: Secondary | ICD-10-CM | POA: Diagnosis not present

## 2016-12-28 DIAGNOSIS — Z7901 Long term (current) use of anticoagulants: Secondary | ICD-10-CM | POA: Diagnosis not present

## 2016-12-28 DIAGNOSIS — B029 Zoster without complications: Secondary | ICD-10-CM | POA: Diagnosis not present

## 2016-12-28 MED ORDER — OXYCODONE-ACETAMINOPHEN 5-325 MG PO TABS: 1.0000 | ORAL_TABLET | Freq: Two times a day (BID) | ORAL | 0 refills | Status: DC | PRN

## 2016-12-28 MED ORDER — "SYRINGE 25G X 1"" 3 ML MISC": 0 refills | Status: AC

## 2016-12-28 MED ORDER — CYANOCOBALAMIN 1000 MCG/ML IJ SOLN: 1000.0000 ug | INTRAMUSCULAR | 1 refills | Status: AC

## 2016-12-28 MED ORDER — ENOXAPARIN SODIUM 60 MG/0.6ML ~~LOC~~ SOLN: 60.0000 mg | Freq: Two times a day (BID) | SUBCUTANEOUS | 0 refills | Status: DC

## 2016-12-28 MED ORDER — ACYCLOVIR 800 MG PO TABS: 800.0000 mg | ORAL_TABLET | Freq: Every day | ORAL | 1 refills | Status: DC

## 2016-12-28 MED ORDER — CYANOCOBALAMIN 1000 MCG/ML IJ SOLN
1000.0000 ug | Freq: Once | INTRAMUSCULAR | Status: AC
  Administered 2016-12-28: 1000 ug via INTRAMUSCULAR

## 2016-12-28 NOTE — Patient Instructions (Signed)
Your lovenox dosing should by 7 am / 7 pm or 7:30 /&:30 Pm at the latest to avoid bleeding during your endoscopy   You can  continue home B12 inections as often as you wish (or just monthly) with the supplies I sent to your pharmacy  Fill the acyclovir and keep it ON HAND    The ShingRx vaccine MAY BE  Available AT [facility]  in about 6 months and 90% EFFECTIVE IN PREVENTING ATTACKS OF  shingles

## 2016-12-28 NOTE — Progress Notes (Signed)
Subjective:  Patient ID: Diana [name], female    DOB: 05/14/1935  Age: 82 y.o. MRN: [id number]  CC: Diagnoses of Herpes zoster without complication, ANEMIA, B12 DEFICIENCY, Iron deficiency anemia due to chronic blood loss, Anticoagulation goal of INR 2 to 3, Moderate protein-calorie malnutrition (HCC), and Sciatica, unspecified laterality were pertinent to this visit.  HPI Diana [name] presents for FOLLOW UP ON CHRONIC ISSUES including anemia of chronic disease,  b12 deficiency, and  malnutrtion secondary to severe esophagitis ,    Had another shingles outbreak limited to  her right buttock   Saw Margarett and was treated with acyclovir    sicussed giving her an rx  To start next time to avoid delay.   Discussed shingrx.   Esophagitis, CV:  Has EGD next week .  Needs to suspend coumadin,  Concerned given history  Of  DVT x 3.  Lovenox discussed with patient for use 3 days prior to procedure   Chronic back pain and foot due to morton's neuroma. Limited use of opioids    Has gained 3 lbs,  Odynophagia improving.  Enjoyed her 82th birthday celebration including cake .   Energy level improving ,  Up and around more,  Starting to cook and rely less on the tube feeds,  But her foot   bothers  Her with prolonged  standing and walking   Cr and hgb stable,    Lab Results  Component Value Date   HGBA1C 5.8 09/14/2016     Outpatient Medications Prior to Visit  Medication Sig Dispense Refill  . acetaminophen (TYLENOL) 325 MG tablet Take 650 mg by mouth every 4 (four) hours as needed for mild pain or fever. Reported on 02/23/2016    . ALPRAZolam (XANAX) 0.5 MG tablet Take 0.5 mg by mouth at bedtime as needed for anxiety. Reported on 04/05/2016    . Calcium Carbonate-Vitamin D 600-400 MG-UNIT tablet Take 1 tablet by mouth daily.    . hydroxychloroquine (PLAQUENIL) 200 MG tablet Take 200 mg by mouth daily.    Marland Kitchen letrozole (FEMARA) 2.5 MG tablet Take 1 tablet (2.5 mg total) by mouth daily.  (Patient taking differently: Take 5 mg by mouth daily. ) 90 tablet 3  . levocetirizine (XYZAL) 5 MG tablet Take 5 mg by mouth daily.    Marland Kitchen lisinopril (PRINIVIL,ZESTRIL) 5 MG tablet Take 1 tablet (5 mg total) by mouth daily. 90 tablet 3  . loperamide (IMODIUM) 2 MG capsule Take 2-4 mg by mouth as needed for diarrhea or loose stools. Reported on 04/05/2016    . metoCLOPramide (REGLAN) 10 MG tablet Take 1 tablet (10 mg total) by mouth 4 (four) times daily -  before meals and at bedtime. 360 tablet 1  . metoprolol succinate (TOPROL-XL) 25 MG 24 hr tablet Take 12.5 mg by mouth daily.     . mycophenolate (CELLCEPT) 500 MG tablet Take 1,000 mg by mouth 2 (two) times daily.    . Nutritional Supplements (FEEDING SUPPLEMENT, JEVITY 1.5 CAL,) LIQD Place 237 mLs into feeding tube continuous. 1000 mL 11  . ondansetron (ZOFRAN-ODT) 4 MG disintegrating tablet Take 4 mg by mouth every 4 (four) hours as needed for nausea or vomiting.    . OXYGEN Inhale 2 mLs into the lungs at bedtime.    . pantoprazole (PROTONIX) 40 MG tablet Take 40 mg by mouth 2 (two) times daily.    . Polyvinyl Alcohol-Povidone (REFRESH OP) Place 2 drops into both eyes daily as needed (dry eyes).     Marland Kitchen  predniSONE (DELTASONE) 5 MG tablet Take 5 mg by mouth daily.     Marland Kitchen sterile water for irrigation 237 mL Give 2.5 mL/hr by tube continuous.    Marland Kitchen SYNTHROID 175 MCG tablet TAKE 1 TABLET (175 MCG TOTAL) BY MOUTH DAILY BEFORE BREAKFAST. 90 tablet 1  . Tadalafil, PAH, (ADCIRCA) 20 MG TABS Take 40 mg by mouth daily.    Marland Kitchen warfarin (COUMADIN) 4 MG tablet Take 1 tablet (4 mg total) by mouth daily. 90 tablet 0  . Water For Irrigation, Sterile (FREE WATER) SOLN Place 150 mLs into feeding tube 2 (two) times daily. 1500 mL 2  . cyanocobalamin (,VITAMIN B-12,) 1000 MCG/ML injection Inject 1 mL (1,000 mcg total) into the muscle every 30 (thirty) days. Pt uses on the 1st of every month. 10 mL 1  . oxyCODONE-acetaminophen (PERCOCET/ROXICET) 5-325 MG tablet Take 1  tablet by mouth 2 (two) times daily as needed for severe pain. May refill on or after December 20 2016 60 tablet 0   No facility-administered medications prior to visit.     Review of Systems;  Patient denies headache, fevers, malaise, unintentional weight loss, skin rash, eye pain, sinus congestion and sinus pain, sore throat, dysphagia,  hemoptysis , cough, dyspnea, wheezing, chest pain, palpitations, orthopnea, edema, abdominal pain, nausea, melena, diarrhea, constipation, flank pain, dysuria, hematuria, urinary  Frequency, nocturia, numbness, tingling, seizures,  Focal weakness, Loss of consciousness,  Tremor, insomnia, depression, anxiety, and suicidal ideation.      Objective:  BP 118/78   Pulse 85   Temp 98.1 F (36.7 C) (Oral)   Resp 16   Ht 5\' 2"  (1.575 m)   Wt 129 lb 12.8 oz (58.9 kg)   SpO2 96%   BMI 23.74 kg/m   BP Readings from Last 3 Encounters:  12/28/16 118/78  12/14/16 118/60  12/08/16 110/76    Wt Readings from Last 3 Encounters:  12/28/16 129 lb 12.8 oz (58.9 kg)  12/14/16 129 lb (58.5 kg)  12/08/16 126 lb 4 oz (57.3 kg)    General appearance: alert, cooperative and appears stated age Ears: normal TM's and external ear canals both ears Throat: lips, mucosa, and tongue normal; teeth and gums normal Neck: no adenopathy, no carotid bruit, supple, symmetrical, trachea midline and thyroid not enlarged, symmetric, no tenderness/mass/nodules Back: symmetric, no curvature. ROM normal. No CVA tenderness. Lungs: clear to auscultation bilaterally Heart: regular rate and rhythm, S1, S2 normal, no murmur, click, rub or gallop Abdomen: soft, non-tender; bowel sounds normal; no masses,  no organomegaly Pulses: 2+ and symmetric Skin: Skin color, texture, turgor normal. No rashes or lesions Lymph nodes: Cervical, supraclavicular, and axillary nodes normal.  Lab Results  Component Value Date   HGBA1C 5.8 09/14/2016   HGBA1C 6.2 05/09/2016   HGBA1C 5.9 07/03/2015     Lab Results  Component Value Date   CREATININE 1.23 (H) 11/10/2016   CREATININE 1.19 09/05/2016   CREATININE 1.01 (H) 08/17/2016    Lab Results  Component Value Date   WBC 6.8 11/10/2016   HGB 10.6 (L) 11/10/2016   HCT 32.3 (L) 11/10/2016   PLT 381 11/10/2016   GLUCOSE 105 (H) 11/10/2016   CHOL 164 10/11/2013   TRIG 152.0 (H) 10/11/2013   HDL 69.70 10/11/2013   LDLDIRECT 50.0 05/09/2016   LDLCALC 64 10/11/2013   ALT 13 (L) 11/10/2016   AST 25 11/10/2016   NA 136 11/10/2016   K 4.6 11/10/2016   CL 105 11/10/2016   CREATININE 1.23 (H)  11/10/2016   BUN 23 (H) 11/10/2016   CO2 25 11/10/2016   TSH 4.21 06/15/2016   INR 4.4 (H) 12/21/2016   HGBA1C 5.8 09/14/2016   MICROALBUR 8.7 (H) 09/14/2016    Mm Diag Breast Tomo Bilateral  Result Date: 09/26/2016 CLINICAL DATA:  History of left lumpectomy with radiation therapy in 2015 . EXAM: 2D DIGITAL DIAGNOSTIC BILATERAL MAMMOGRAM WITH CAD AND ADJUNCT TOMO COMPARISON:  09/24/2015 ACR Breast Density Category c: The breast tissue is heterogeneously dense, which may obscure small masses. FINDINGS: Post operative changes are seen in the leftbreast. No suspicious mass, distortion, or microcalcifications are identified to suggest presence of malignancy. Mammographic images were processed with CAD. IMPRESSION: No mammographic evidence for malignancy. RECOMMENDATION: Diagnostic mammogram is suggested in 1 year. (Code:DM-B-01Y) I have discussed the findings and recommendations with the patient. Results were also provided in writing at the conclusion of the visit. If applicable, a reminder letter will be sent to the patient regarding the next appointment. BI-RADS CATEGORY  2: Benign. Electronically Signed   By: Norva Pavlov M.D.   On: 09/26/2016 10:46    Assessment & Plan:   Problem List Items Addressed This Visit    ANEMIA, B12 DEFICIENCY    Resumed monthly b12 injections given persistent macrocytosis   Lab Results  Component Value Date    VITAMINB12 390 09/29/2015         Relevant Medications   cyanocobalamin (,VITAMIN B-12,) 1000 MCG/ML injection   cyanocobalamin ((VITAMIN B-12)) injection 1,000 mcg (Completed)   Anemia, iron deficiency    Secondary to recurrent GI bleeds.  Markedly improved with iv iron infusions.   CBC Latest Ref Rng & Units 11/10/2016 09/05/2016 08/17/2016  WBC 3.6 - 11.0 K/uL 6.8 6.9 6.1  Hemoglobin 12.0 - 16.0 g/dL 10.6(L) 10.5(L) 10.4(L)  Hematocrit 35.0 - 47.0 % 32.3(L) 32.6(L) 31.7(L)  Platelets 150 - 440 K/uL 381 400.0 283           Relevant Medications   cyanocobalamin (,VITAMIN B-12,) 1000 MCG/ML injection   cyanocobalamin ((VITAMIN B-12)) injection 1,000 mcg (Completed)   Anticoagulation goal of INR 2 to 3    Discussed bridging her coumadin suspension with 3 days of lovenox prior to her egd .  rx sent and dosing at 7 a and 7p advised so she can have a narrow unprotected zone.       Protein-calorie malnutrition (HCC)    Resolved with  resolution of esophagitis .  Lab Results  Component Value Date   LABPROT 47.9 (H) 12/21/2016         Sciatica    Her pain is persistent despite ESI and nerve blocks and has been complicated by vertebral fracture,  It is controlled with  percocet to 2 daily.  She has not had any ER visits  And has not requested any early refills.  Her Refill history was confirmed via [place] Controlled Substance database by me today during her visit and there have been no prescriptions of controlled substances filled from any providers other than me. . Refills for #60 tablets /month for April, May and June given.        Shingles    Recurrent,  rx for acyclovicor given to patient for future use.       Relevant Medications   acyclovir (ZOVIRAX) 800 MG tablet      I have discontinued Ms. Menendez's enoxaparin, oxyCODONE-acetaminophen, and oxyCODONE-acetaminophen. I have also changed her oxyCODONE-acetaminophen. Additionally, I am having her start on enoxaparin and  SYRINGE  3CC/25GX1". Lastly, I am having her maintain her hydroxychloroquine, tadalafil (PAH), acetaminophen, loperamide, predniSONE, free water, metoprolol succinate, mycophenolate, Polyvinyl Alcohol-Povidone (REFRESH OP), OXYGEN, ondansetron, ALPRAZolam, sterile water for irrigation 237 mL, metoCLOPramide, lisinopril, letrozole, Calcium Carbonate-Vitamin D, pantoprazole, SYNTHROID, levocetirizine, feeding supplement (JEVITY 1.5 CAL), warfarin, acyclovir, and cyanocobalamin. We administered cyanocobalamin.  Meds ordered this encounter  Medications  . acyclovir (ZOVIRAX) 800 MG tablet    Sig: Take 1 tablet (800 mg total) by mouth 5 (five) times daily.    Dispense:  35 tablet    Refill:  1  . enoxaparin (LOVENOX) 60 MG/0.6ML injection    Sig: Inject 0.6 mLs (60 mg total) into the skin every 12 (twelve) hours.    Dispense:  6 Syringe    Refill:  0  . DISCONTD: oxyCODONE-acetaminophen (PERCOCET/ROXICET) 5-325 MG tablet    Sig: Take 1 tablet by mouth 2 (two) times daily as needed for severe pain. May refill on or after May 3  3 2018    Dispense:  60 tablet    Refill:  0  . DISCONTD: oxyCODONE-acetaminophen (PERCOCET/ROXICET) 5-325 MG tablet    Sig: Take 1 tablet by mouth 2 (two) times daily as needed for severe pain. May refill on or after Feb 19 2017    Dispense:  60 tablet    Refill:  0  . oxyCODONE-acetaminophen (PERCOCET/ROXICET) 5-325 MG tablet    Sig: Take 1 tablet by mouth 2 (two) times daily as needed for severe pain. May refill on or after March 21 2017    Dispense:  60 tablet    Refill:  0  . cyanocobalamin (,VITAMIN B-12,) 1000 MCG/ML injection    Sig: Inject 1 mL (1,000 mcg total) into the muscle every 30 (thirty) days. Pt uses on the 1st of every month.    Dispense:  10 mL    Refill:  1  . Syringe/Needle, Disp, (SYRINGE 3CC/25GX1") 25G X 1" 3 ML MISC    Sig: Use for b12 injections    Dispense:  50 each    Refill:  0  . cyanocobalamin ((VITAMIN B-12)) injection 1,000 mcg     Medications Discontinued During This Encounter  Medication Reason  . acyclovir (ZOVIRAX) 800 MG tablet Reorder  . enoxaparin (LOVENOX) 80 MG/0.8ML injection Expired Prescription  . oxyCODONE-acetaminophen (PERCOCET/ROXICET) 5-325 MG tablet Reorder  . oxyCODONE-acetaminophen (PERCOCET/ROXICET) 5-325 MG tablet Reorder  . oxyCODONE-acetaminophen (PERCOCET/ROXICET) 5-325 MG tablet Reorder  . cyanocobalamin (,VITAMIN B-12,) 1000 MCG/ML injection Reorder    Follow-up: Return in about 3 months (around 03/29/2017).   Sherlene Shams, MD

## 2016-12-28 NOTE — Progress Notes (Signed)
Pre visit review using our clinic review tool, if applicable. No additional management support is needed unless otherwise documented below in the visit note. 

## 2016-12-31 NOTE — Assessment & Plan Note (Addendum)
Her pain is persistent despite ESI and nerve blocks and has been complicated by vertebral fracture,  It is controlled with  percocet to 2 daily.  She has not had any ER visits  And has not requested any early refills.  Her Refill history was confirmed via [place] Controlled Substance database by me today during her visit and there have been no prescriptions of controlled substances filled from any providers other than me. . Refills for #60 tablets /month for April, May and June given.

## 2016-12-31 NOTE — Assessment & Plan Note (Signed)
Resolved with  resolution of esophagitis .  Lab Results  Component Value Date   LABPROT 47.9 (H) 12/21/2016

## 2016-12-31 NOTE — Assessment & Plan Note (Signed)
Secondary to recurrent GI bleeds.  Markedly improved with iv iron infusions.   CBC Latest Ref Rng & Units 11/10/2016 09/05/2016 08/17/2016  WBC 3.6 - 11.0 K/uL 6.8 6.9 6.1  Hemoglobin 12.0 - 16.0 g/dL 10.6(L) 10.5(L) 10.4(L)  Hematocrit 35.0 - 47.0 % 32.3(L) 32.6(L) 31.7(L)  Platelets 150 - 440 K/uL 381 400.0 283

## 2016-12-31 NOTE — Assessment & Plan Note (Signed)
Recurrent,  rx for acyclovicor given to patient for future use.

## 2016-12-31 NOTE — Assessment & Plan Note (Signed)
Discussed bridging her coumadin suspension with 3 days of lovenox prior to her egd .  rx sent and dosing at 7 a and 7p advised so she can have a narrow unprotected zone.

## 2016-12-31 NOTE — Assessment & Plan Note (Signed)
Resumed monthly b12 injections given persistent macrocytosis   Lab Results  Component Value Date   VITAMINB12 390 09/29/2015

## 2017-01-04 ENCOUNTER — Other Ambulatory Visit: Payer: Medicare Other

## 2017-01-04 ENCOUNTER — Other Ambulatory Visit (INDEPENDENT_AMBULATORY_CARE_PROVIDER_SITE_OTHER): Payer: Medicare Other

## 2017-01-04 DIAGNOSIS — Z7901 Long term (current) use of anticoagulants: Secondary | ICD-10-CM | POA: Diagnosis not present

## 2017-01-04 DIAGNOSIS — J320 Chronic maxillary sinusitis: Secondary | ICD-10-CM | POA: Diagnosis not present

## 2017-01-04 DIAGNOSIS — J301 Allergic rhinitis due to pollen: Secondary | ICD-10-CM | POA: Diagnosis not present

## 2017-01-04 LAB — PROTIME-INR
INR: 1.5 ratio — ABNORMAL HIGH (ref 0.8–1.0)
PROTHROMBIN TIME: 15.8 s — AB (ref 9.6–13.1)

## 2017-01-05 ENCOUNTER — Ambulatory Visit: Payer: Medicare Other | Admitting: Anesthesiology

## 2017-01-05 ENCOUNTER — Encounter
Admission: RE | Disposition: A | Discharge status: Home / Self Care | Payer: Self-pay | Source: Ambulatory Visit | Attending: Gastroenterology

## 2017-01-05 ENCOUNTER — Ambulatory Visit
Admission: RE | Admit: 2017-01-05 | Discharge: 2017-01-05 | Disposition: A | Discharge status: Home / Self Care | Payer: Medicare Other | Source: Ambulatory Visit | Attending: Gastroenterology | Admitting: Gastroenterology

## 2017-01-05 ENCOUNTER — Encounter: Payer: Self-pay | Admitting: *Deleted

## 2017-01-05 DIAGNOSIS — Z7901 Long term (current) use of anticoagulants: Secondary | ICD-10-CM | POA: Diagnosis not present

## 2017-01-05 DIAGNOSIS — Z87891 Personal history of nicotine dependence: Secondary | ICD-10-CM | POA: Diagnosis not present

## 2017-01-05 DIAGNOSIS — I10 Essential (primary) hypertension: Secondary | ICD-10-CM | POA: Insufficient documentation

## 2017-01-05 DIAGNOSIS — K2960 Other gastritis without bleeding: Secondary | ICD-10-CM | POA: Diagnosis not present

## 2017-01-05 DIAGNOSIS — Z79811 Long term (current) use of aromatase inhibitors: Secondary | ICD-10-CM | POA: Diagnosis not present

## 2017-01-05 DIAGNOSIS — Z86718 Personal history of other venous thrombosis and embolism: Secondary | ICD-10-CM | POA: Insufficient documentation

## 2017-01-05 DIAGNOSIS — K449 Diaphragmatic hernia without obstruction or gangrene: Secondary | ICD-10-CM | POA: Diagnosis not present

## 2017-01-05 DIAGNOSIS — Z7952 Long term (current) use of systemic steroids: Secondary | ICD-10-CM | POA: Diagnosis not present

## 2017-01-05 DIAGNOSIS — K209 Esophagitis, unspecified: Secondary | ICD-10-CM | POA: Diagnosis not present

## 2017-01-05 DIAGNOSIS — Z13810 Encounter for screening for upper gastrointestinal disorder: Secondary | ICD-10-CM | POA: Diagnosis not present

## 2017-01-05 DIAGNOSIS — I739 Peripheral vascular disease, unspecified: Secondary | ICD-10-CM | POA: Diagnosis not present

## 2017-01-05 DIAGNOSIS — K2970 Gastritis, unspecified, without bleeding: Secondary | ICD-10-CM | POA: Diagnosis not present

## 2017-01-05 DIAGNOSIS — Z9981 Dependence on supplemental oxygen: Secondary | ICD-10-CM | POA: Diagnosis not present

## 2017-01-05 DIAGNOSIS — B9681 Helicobacter pylori [H. pylori] as the cause of diseases classified elsewhere: Secondary | ICD-10-CM | POA: Diagnosis not present

## 2017-01-05 DIAGNOSIS — Z7951 Long term (current) use of inhaled steroids: Secondary | ICD-10-CM | POA: Insufficient documentation

## 2017-01-05 DIAGNOSIS — I4891 Unspecified atrial fibrillation: Secondary | ICD-10-CM | POA: Insufficient documentation

## 2017-01-05 DIAGNOSIS — K259 Gastric ulcer, unspecified as acute or chronic, without hemorrhage or perforation: Secondary | ICD-10-CM | POA: Insufficient documentation

## 2017-01-05 DIAGNOSIS — Z8711 Personal history of peptic ulcer disease: Secondary | ICD-10-CM | POA: Insufficient documentation

## 2017-01-05 DIAGNOSIS — Z79899 Other long term (current) drug therapy: Secondary | ICD-10-CM | POA: Diagnosis not present

## 2017-01-05 DIAGNOSIS — Z853 Personal history of malignant neoplasm of breast: Secondary | ICD-10-CM | POA: Diagnosis not present

## 2017-01-05 DIAGNOSIS — K2950 Unspecified chronic gastritis without bleeding: Secondary | ICD-10-CM | POA: Insufficient documentation

## 2017-01-05 DIAGNOSIS — Z931 Gastrostomy status: Secondary | ICD-10-CM | POA: Insufficient documentation

## 2017-01-05 DIAGNOSIS — E039 Hypothyroidism, unspecified: Secondary | ICD-10-CM | POA: Diagnosis not present

## 2017-01-05 DIAGNOSIS — K2900 Acute gastritis without bleeding: Secondary | ICD-10-CM | POA: Diagnosis not present

## 2017-01-05 DIAGNOSIS — K219 Gastro-esophageal reflux disease without esophagitis: Secondary | ICD-10-CM | POA: Diagnosis not present

## 2017-01-05 DIAGNOSIS — K210 Gastro-esophageal reflux disease with esophagitis: Secondary | ICD-10-CM | POA: Diagnosis not present

## 2017-01-05 HISTORY — PX: ESOPHAGOGASTRODUODENOSCOPY (EGD) WITH PROPOFOL: SHX5813

## 2017-01-05 LAB — PROTIME-INR
INR: 1.18
Prothrombin Time: 15.1 seconds (ref 11.4–15.2)

## 2017-01-05 SURGERY — ESOPHAGOGASTRODUODENOSCOPY (EGD) WITH PROPOFOL
Anesthesia: General

## 2017-01-05 MED ORDER — PROPOFOL 500 MG/50ML IV EMUL
INTRAVENOUS | Status: AC
  Filled 2017-01-05: qty 50

## 2017-01-05 MED ORDER — LIDOCAINE 2% (20 MG/ML) 5 ML SYRINGE
INTRAMUSCULAR | Status: DC | PRN
  Administered 2017-01-05: 30 mg via INTRAVENOUS

## 2017-01-05 MED ORDER — MIDAZOLAM HCL 2 MG/2ML IJ SOLN
INTRAMUSCULAR | Status: AC
  Filled 2017-01-05: qty 2

## 2017-01-05 MED ORDER — SODIUM CHLORIDE 0.9 % IV SOLN
INTRAVENOUS | Status: DC
Start: 2017-01-05 — End: 2017-01-05
  Administered 2017-01-05: 12:00:00 via INTRAVENOUS

## 2017-01-05 MED ORDER — FENTANYL CITRATE (PF) 100 MCG/2ML IJ SOLN
INTRAMUSCULAR | Status: DC | PRN
  Administered 2017-01-05 (×2): 25 ug via INTRAVENOUS

## 2017-01-05 MED ORDER — MIDAZOLAM HCL 5 MG/5ML IJ SOLN
INTRAMUSCULAR | Status: DC | PRN
  Administered 2017-01-05: 0.5 mg via INTRAVENOUS

## 2017-01-05 MED ORDER — SODIUM CHLORIDE 0.9 % IV SOLN
INTRAVENOUS | Status: DC
  Administered 2017-01-05: 12:00:00 via INTRAVENOUS

## 2017-01-05 MED ORDER — FENTANYL CITRATE (PF) 100 MCG/2ML IJ SOLN
INTRAMUSCULAR | Status: AC
  Filled 2017-01-05: qty 2

## 2017-01-05 MED ORDER — PROPOFOL 10 MG/ML IV BOLUS
INTRAVENOUS | Status: DC | PRN
  Administered 2017-01-05: 100 mg via INTRAVENOUS

## 2017-01-05 MED ORDER — PROPOFOL 500 MG/50ML IV EMUL
INTRAVENOUS | Status: DC | PRN
  Administered 2017-01-05: 140 ug/kg/min via INTRAVENOUS

## 2017-01-05 NOTE — Anesthesia Post-op Follow-up Note (Cosign Needed)
Anesthesia QCDR form completed.        

## 2017-01-05 NOTE — Transfer of Care (Signed)
Immediate Anesthesia Transfer of Care Note  Patient: Diana [name]  Procedure(s) Performed: Procedure(s): ESOPHAGOGASTRODUODENOSCOPY (EGD) WITH PROPOFOL (N/A)  Patient Location: PACU and Endoscopy Unit  Anesthesia Type:General  Level of Consciousness: awake, oriented and patient cooperative  Airway & Oxygen Therapy: Patient Spontanous Breathing and Patient connected to nasal cannula oxygen  Post-op Assessment: Report given to RN and Post -op Vital signs reviewed and stable  Post vital signs: Reviewed and stable  Last Vitals:  Vitals:   01/05/17 1033  BP: 138/72  Pulse: 64  Resp: 16  Temp: 37.1 C    Last Pain:  Vitals:   01/05/17 1033  TempSrc: Tympanic         Complications: No apparent anesthesia complications

## 2017-01-05 NOTE — Anesthesia Preprocedure Evaluation (Signed)
Anesthesia Evaluation  Patient identified by MRN, date of birth, ID band Patient awake    Reviewed: Allergy & Precautions, H&P , NPO status , Patient's Chart, lab work & pertinent test results, reviewed documented beta blocker date and time   Airway Mallampati: III   Neck ROM: full    Dental  (+) Poor Dentition   Pulmonary neg pulmonary ROS, shortness of breath and with exertion, pneumonia, resolved, former smoker,    Pulmonary exam normal        Cardiovascular hypertension, + Peripheral Vascular Disease and +CHF  negative cardio ROS Normal cardiovascular exam Rate:Normal     Neuro/Psych  Neuromuscular disease negative neurological ROS  negative psych ROS   GI/Hepatic negative GI ROS, Neg liver ROS, hiatal hernia, PUD, GERD  Medicated,  Endo/Other  negative endocrine ROSHypothyroidism   Renal/GU negative Renal ROS  negative genitourinary   Musculoskeletal   Abdominal   Peds  Hematology negative hematology ROS (+) anemia ,   Anesthesia Other Findings Past Medical History: No date: Acute glomerulonephritis with other specified * No date: Alveolar aeration decreased No date: Anemia No date: Arthritis No date: Atrial fibrillation ([place]) 06/03/2014: Breast cancer ([place])     Comment: positive, radiation No date: Cancer ([place])     Comment: Breast No date: CHF (congestive heart failure) (HCC) No date: Diaphragmatic hernia No date: DVT (deep venous thrombosis) (HCC) No date: Dysrhythmia No date: Environmental allergies No date: Esophageal reflux No date: Feeding problem     Comment: FEEDING TUBE No date: Hiatal hernia No date: Hyperlipidemia No date: Hypertension No date: Lower extremity edema No date: Lupus (systemic lupus erythematosus) (HCC) No date: Neuropathy ([place]) No date: Osteopenia No date: Oxygen deficiency     Comment: USES HS No date: Peripheral neuropathy, hereditary/idiopathic No date:  Pneumonia No date: Primary pulmonary hypertension ([place]) No date: Pulmonary hypertension No date: Renal insufficiency No date: Sciatica No date: Shingles March 2013: Shingles (herpes zoster) polyneuropathy     Comment: 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 No date: Shortness of breath dyspnea No date: Unspecified hypothyroidism No date: Unspecified menopausal and postmenopausal diso* No date: Urinary incontinence without sensory awareness No date: Vitamin B deficiency Past Surgical History: 06/03/2014: BREAST EXCISIONAL BIOPSY Left     Comment: positive No date: BREAST SURGERY 04/05/2016: CATARACT EXTRACTION W/PHACO Right     Comment: Procedure: CATARACT EXTRACTION PHACO AND               INTRAOCULAR LENS PLACEMENT (IOC);  Surgeon:               Birder Robson, MD;  Location: [facility] ORS;                Service: Ophthalmology;  Laterality: Right;  [place]              01:20 AP% 23.9 CDE 19.29 fluid pack lot #               [id number] 04/26/2016: CATARACT EXTRACTION W/PHACO Left     Comment: Procedure: CATARACT EXTRACTION PHACO AND               INTRAOCULAR LENS PLACEMENT (IOC);  Surgeon:               Birder Robson, MD;  Location: [facility] ORS;                Service: Ophthalmology;  Laterality: Left;   [place]              00:59 AP% 23.2  CDE 13.83 Fluid pack lot #               [id number] No date: DIAGNOSTIC LAPAROSCOPY 07/19/2015: ESOPHAGOGASTRODUODENOSCOPY N/A     Comment: Procedure: ESOPHAGOGASTRODUODENOSCOPY (EGD);                Surgeon: Hulen Luster, MD;  Location: [facility]               ENDOSCOPY;  Service: Gastroenterology;                Laterality: N/A; 03/28/2015: ESOPHAGOGASTRODUODENOSCOPY (EGD) WITH PROPOFOL N/A     Comment: Procedure: ESOPHAGOGASTRODUODENOSCOPY (EGD)               WITH PROPOFOL;  Surgeon: Robert Bellow, MD;              Location: [facility] ENDOSCOPY;  Service: Endoscopy;               Laterality: N/A; 06/10/2015: ESOPHAGOGASTRODUODENOSCOPY (EGD) WITH PROPOFOL N/A      Comment: Procedure: ESOPHAGOGASTRODUODENOSCOPY (EGD)               WITH PROPOFOL;  Surgeon: Lollie Sails, MD;              Location: [facility] ENDOSCOPY;  Service: Endoscopy;               Laterality: N/A; 10/20/2015: ESOPHAGOGASTRODUODENOSCOPY (EGD) WITH PROPOFOL N/A     Comment: Procedure: ESOPHAGOGASTRODUODENOSCOPY (EGD)               WITH PROPOFOL;  Surgeon: Lollie Sails, MD;              Location: [facility] ENDOSCOPY;  Service: Endoscopy;               Laterality: N/A; 02/23/2016: ESOPHAGOGASTRODUODENOSCOPY (EGD) WITH PROPOFOL N/A     Comment: Procedure: ESOPHAGOGASTRODUODENOSCOPY (EGD)               WITH PROPOFOL;  Surgeon: Lollie Sails, MD;              Location: [facility] ENDOSCOPY;  Service: Endoscopy;               Laterality: N/A; 02/10/2015: KYPHOPLASTY N/A     Comment: Procedure: KYPHOPLASTY/L2;  Surgeon: Hessie Knows, MD;  Location: [facility] ORS;  Service:               Orthopedics;  Laterality: N/A; 03/11/2015: LAPAROTOMY N/A     Comment: Procedure: REDUCTION OF GASTRIC VOLVULUS,               SPLEENECTOMY, GASTRIC TUBE PLACEMENT;  Surgeon:              Robert Bellow, MD;  Location: [facility] ORS;                Service: General;  Laterality: N/A; No date: MASTECTOMY No date: MOHS SURGERY 06/12/2015: PERIPHERAL VASCULAR CATHETERIZATION N/A     Comment: Procedure: IVC Filter Insertion;  Surgeon:               Algernon Huxley, MD;  Location: [place] CV               LAB;  Service: Cardiovascular;  Laterality:               N/A; No date: STOMACH SURGERY     Comment: for volvolus BMI  Body Mass Index:  21.95 kg/m     Reproductive/Obstetrics                             Anesthesia Physical  Anesthesia Plan  ASA: IV  Anesthesia Plan: General   Post-op Pain Management:    Induction: Intravenous  Airway Management Planned: Nasal Cannula  Additional Equipment:   Intra-op Plan:   Post-operative Plan:   Informed Consent: I  have reviewed the patients History and Physical, chart, labs and discussed the procedure including the risks, benefits and alternatives for the proposed anesthesia with the patient or authorized representative who has indicated his/her understanding and acceptance.   Dental Advisory Given  Plan Discussed with: CRNA  Anesthesia Plan Comments:         Anesthesia Quick Evaluation

## 2017-01-05 NOTE — Anesthesia Postprocedure Evaluation (Signed)
Anesthesia Post Note  Patient: Diana [name]  Procedure(s) Performed: Procedure(s) (LRB): ESOPHAGOGASTRODUODENOSCOPY (EGD) WITH PROPOFOL (N/A)  Patient location during evaluation: PACU Anesthesia Type: General Level of consciousness: awake and alert and oriented Pain management: pain level controlled Vital Signs Assessment: post-procedure vital signs reviewed and stable Respiratory status: spontaneous breathing Cardiovascular status: blood pressure returned to baseline Anesthetic complications: no     Last Vitals:  Vitals:   01/05/17 1230 01/05/17 1240  BP: 133/63 (!) 144/73  Pulse: 77 72  Resp: 18 17  Temp:      Last Pain:  Vitals:   01/05/17 1033  TempSrc: Tympanic                 Layali Freund

## 2017-01-05 NOTE — H&P (Signed)
Outpatient short stay form Pre-procedure 01/05/2017 11:40 AM Diana Sails MD  Primary Physician: Dr Deborra Medina  Reason for visit:  EGD  History of present illness:  Patient is 82 year old female presenting today as above. She has a personal history of his for which she is going on immunosuppressant agents. She developed a severe esophageal ulceration this later found to be positive for CMV. She's had 2 courses of treatment. Currently she states she is doing well with her dysphagia and has been eating items such as hot dogs and a. Paracentesis of steak. She does have her eating tube still in place that has been used in the past. She also recovered from an episode of zoster about 3 months ago. She has been off her Coumadin for 5 days. Her INR today was 1.18. She was bridged with Lovenox which was last taken last night at 6:00.   Current Facility-Administered Medications:  .  0.9 %  sodium chloride infusion, , Intravenous, Continuous, Diana Sails, MD .  0.9 %  sodium chloride infusion, , Intravenous, Continuous, Diana Sails, MD  Prescriptions Prior to Admission  Medication Sig Dispense Refill Last Dose  . Ascorbic Acid (VITAMIN C) 1000 MG tablet Take 1,000 mg by mouth daily.   01/04/2017 at Unknown time  . azelastine (ASTELIN) 0.1 % nasal spray Place 1 spray into both nostrils. Use in each nostril as directed     . Bromfenac Sodium (BROMSITE) 0.075 % SOLN Apply 2 drops to eye.     . budesonide-formoterol (SYMBICORT) 160-4.5 MCG/ACT inhaler Inhale into the lungs.     . Calcium Carbonate-Vitamin D 600-400 MG-UNIT tablet Take 1 tablet by mouth daily.   01/04/2017 at Unknown time  . cholecalciferol (VITAMIN D) 400 units TABS tablet Take 400 Units by mouth.   01/04/2017 at Unknown time  . cyanocobalamin (,VITAMIN B-12,) 1000 MCG/ML injection Inject 1 mL (1,000 mcg total) into the muscle every 30 (thirty) days. Pt uses on the 1st of every month. 10 mL 1 01/04/2017 at Unknown time  .  enoxaparin (LOVENOX) 60 MG/0.6ML injection Inject 0.6 mLs (60 mg total) into the skin every 12 (twelve) hours. 6 Syringe 0 01/04/2017 at Unknown time  . lisinopril (PRINIVIL,ZESTRIL) 5 MG tablet Take 1 tablet (5 mg total) by mouth daily. 90 tablet 3 01/04/2017 at Unknown time  . metoprolol succinate (TOPROL-XL) 25 MG 24 hr tablet Take 12.5 mg by mouth daily.    01/05/2017 at 0800  . pantoprazole (PROTONIX) 40 MG tablet Take 40 mg by mouth 2 (two) times daily.   01/04/2017 at Unknown time  . SYNTHROID 175 MCG tablet TAKE 1 TABLET (175 MCG TOTAL) BY MOUTH DAILY BEFORE BREAKFAST. 90 tablet 1 01/04/2017 at Unknown time  . valganciclovir (VALCYTE) 50 MG/ML SOLR Take 50 mg by mouth 2 (two) times daily.     Marland Kitchen warfarin (COUMADIN) 4 MG tablet Take 1 tablet (4 mg total) by mouth daily. 90 tablet 0 Past Week at Unknown time  . acetaminophen (TYLENOL) 325 MG tablet Take 650 mg by mouth every 4 (four) hours as needed for mild pain or fever. Reported on 02/23/2016   Taking  . ALPRAZolam (XANAX) 0.5 MG tablet Take 0.5 mg by mouth at bedtime as needed for anxiety. Reported on 04/05/2016   Taking  . hydroxychloroquine (PLAQUENIL) 200 MG tablet Take 200 mg by mouth daily.   Not Taking at Unknown time  . letrozole (FEMARA) 2.5 MG tablet Take 1 tablet (2.5 mg total) by mouth  daily. (Patient taking differently: Take 5 mg by mouth daily. ) 90 tablet 3 Taking  . levocetirizine (XYZAL) 5 MG tablet Take 5 mg by mouth daily.   Taking  . loperamide (IMODIUM) 2 MG capsule Take 2-4 mg by mouth as needed for diarrhea or loose stools. Reported on 04/05/2016   Taking  . metoCLOPramide (REGLAN) 10 MG tablet Take 1 tablet (10 mg total) by mouth 4 (four) times daily -  before meals and at bedtime. 360 tablet 1 Taking  . mycophenolate (CELLCEPT) 500 MG tablet Take 1,000 mg by mouth 2 (two) times daily.   Taking  . Nutritional Supplements (FEEDING SUPPLEMENT, JEVITY 1.5 CAL,) LIQD Place 237 mLs into feeding tube continuous. 1000 mL 11 Taking  .  ondansetron (ZOFRAN-ODT) 4 MG disintegrating tablet Take 4 mg by mouth every 4 (four) hours as needed for nausea or vomiting.   Taking  . oxyCODONE-acetaminophen (PERCOCET/ROXICET) 5-325 MG tablet Take 1 tablet by mouth 2 (two) times daily as needed for severe pain. May refill on or after March 21 2017 60 tablet 0   . OXYGEN Inhale 2 mLs into the lungs at bedtime.   Taking  . Polyvinyl Alcohol-Povidone (REFRESH OP) Place 2 drops into both eyes daily as needed (dry eyes).    Taking  . predniSONE (DELTASONE) 5 MG tablet Take 5 mg by mouth daily.    Taking  . sterile water for irrigation 237 mL Give 2.5 mL/hr by tube continuous.   Taking  . sucralfate (CARAFATE) 1 GM/10ML suspension Take 1 g by mouth 4 (four) times daily. Before meals and nightly   Not Taking at Unknown time  . Syringe/Needle, Disp, (SYRINGE 3CC/25GX1") 25G X 1" 3 ML MISC Use for b12 injections 50 each 0   . Tadalafil, PAH, (ADCIRCA) 20 MG TABS Take 40 mg by mouth daily.   Taking  . Water For Irrigation, Sterile (FREE WATER) SOLN Place 150 mLs into feeding tube 2 (two) times daily. 1500 mL 2 Taking     Allergies  Allergen Reactions  . Amoxicillin Other (See Comments)    Reaction:  Stomach cramps   . Cefuroxime Itching  . Contrast Media [Iodinated Diagnostic Agents] Hives  . Metrizamide Hives  . Morphine And Related     Reaction: face flushed    . Propoxyphene     Stomach cramp  . Cephalexin Rash  . Sulfa Antibiotics Rash    REACTION: Rash  . Sulfonamide Derivatives Rash  . Tramadol Hcl Rash  . Venlafaxine Rash     Past Medical History:  Diagnosis Date  . Acute glomerulonephritis with other specified pathological lesion in kidney in disease classified elsewhere(580.81)   . Alveolar aeration decreased   . Anemia   . Arthritis   . Atrial fibrillation ([place])   . Breast cancer ([place]) 06/03/2014   positive, radiation  . Cancer (HCC)    Breast  . CHF (congestive heart failure) ([place])   . Diaphragmatic hernia   . DVT  (deep venous thrombosis) ([place])   . Dysrhythmia   . Environmental allergies   . Esophageal reflux   . Feeding problem    FEEDING TUBE  . Hiatal hernia   . Hyperlipidemia   . Hypertension   . Lower extremity edema   . Lupus (systemic lupus erythematosus) ([place])   . Neuropathy   . Osteopenia   . Oxygen deficiency    USES HS  . Peripheral neuropathy, hereditary/idiopathic   . Pneumonia   . Primary pulmonary hypertension (  [place])   . Pulmonary hypertension ([place])   . Renal insufficiency   . Sciatica   . Shingles   . Shingles (herpes zoster) polyneuropathy March 2013   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  . Shortness of breath dyspnea   . Unspecified hypothyroidism   . Unspecified menopausal and postmenopausal disorder   . Urinary incontinence without sensory awareness   . Vitamin B deficiency     Review of systems:      Physical Exam    Heart and lungs: Regular rate and rhythm without rub or gallop, lungs are bilaterally clear.    HEENT: Normocephalic atraumatic eyes are anicteric. Her left eye shows a small subcutaneous conjunctival hemorrhage laterally. Clear margin    Other:     Pertinant exam for procedure: Soft nontender nondistended bowel sounds positive normoactive. Her PEG is still in place in the left upper quadrant.    Planned proceedures: EGD and indicated procedures. I have discussed the risks benefits and complications of procedures to include not limited to bleeding, infection, perforation and the risk of sedation and the patient wishes to proceed.    Diana Sails, MD Gastroenterology 01/05/2017  11:40 AM

## 2017-01-05 NOTE — Op Note (Addendum)
[facility] Gastroenterology Patient Name: Diana [name] Procedure Date: 01/05/2017 12:00 PM MRN: [id number] Account #: [id number] Date of Birth: 09-08-1935 Admit Type: Outpatient Age: 82 Room: [facility] ENDO ROOM 1 Gender: Female Note Status: Finalized Procedure:            Upper GI endoscopy Indications:          Follow-up of esophagitis Providers:            Christena Deem, MD Referring MD:         Duncan Dull, MD (Referring MD) Medicines:            Monitored Anesthesia Care Complications:        No immediate complications. Procedure:            Pre-Anesthesia Assessment:                       - ASA Grade Assessment: IV - A patient with severe                        systemic disease that is a constant threat to life.                       After obtaining informed consent, the endoscope was                        passed under direct vision. Throughout the procedure,                        the patient's blood pressure, pulse, and oxygen                        saturations were monitored continuously. The Endoscope                        was introduced through the mouth, and advanced to the                        third part of duodenum. The patient tolerated the                        procedure well. Findings:      The esophagus was normal in appearance. The GE junction was easily       appreciated and there was no evidnce of erosions above the junction. The       proximal cardia showed a villous appearing mucosa that showed minimal       inflamation. Biopsies were taken for histology of the GE junction.      A medium-sized hiatal hernia with a few Cameron ulcers was found at the       diaphragmatic transition in the gastric wall. The Z-line was a variable       distance from incisors; the hiatal hernia was sliding.      Patchy minimal inflammation characterized by congestion (edema) and       erythema was found in the gastric body. Biopsies were taken with a  cold       forceps for histology.      The examined duodenum was normal.      The cardia and gastric fundus were normal on retroflexion otherwise.      There was evidence of an  intact gastrostomy with a patent G-tube present       on the anterior wall of the gastric body. This was characterized by       healthy appearing mucosa. Impression:           - Medium-sized hiatal hernia with a few Cameron ulcers.                       - Gastritis. Biopsied.                       - Normal examined duodenum. Recommendation:       - Discharge patient to home.                       - Use Protonix (pantoprazole) 40 mg PO BID daily.                       - Use sucralfate tablets 1 gram PO BID daily.                       - Return to GI clinic in 6 weeks. Procedure Code(s):    --- Professional ---                       [phone], Esophagogastroduodenoscopy, flexible, transoral;                        with biopsy, single or multiple Diagnosis Code(s):    --- Professional ---                       K44.9, Diaphragmatic hernia without obstruction or                        gangrene                       K25.9, Gastric ulcer, unspecified as acute or chronic,                        without hemorrhage or perforation                       K29.70, Gastritis, unspecified, without bleeding                       K20.9, Esophagitis, unspecified CPT copyright 2016 [employer]. All rights reserved. The codes documented in this report are preliminary and upon coder review may  be revised to meet current compliance requirements. Christena Deem, MD 01/05/2017 12:35:49 PM This report has been signed electronically. Number of Addenda: 0 Note Initiated On: 01/05/2017 12:00 PM      [facility]

## 2017-01-06 ENCOUNTER — Encounter: Payer: Self-pay | Admitting: Gastroenterology

## 2017-01-10 LAB — SURGICAL PATHOLOGY

## 2017-01-13 ENCOUNTER — Telehealth: Payer: Self-pay | Admitting: *Deleted

## 2017-01-13 DIAGNOSIS — Z7901 Long term (current) use of anticoagulants: Secondary | ICD-10-CM

## 2017-01-13 DIAGNOSIS — D500 Iron deficiency anemia secondary to blood loss (chronic): Secondary | ICD-10-CM

## 2017-01-13 DIAGNOSIS — E119 Type 2 diabetes mellitus without complications: Secondary | ICD-10-CM

## 2017-01-13 DIAGNOSIS — K625 Hemorrhage of anus and rectum: Secondary | ICD-10-CM

## 2017-01-13 DIAGNOSIS — E870 Hyperosmolality and hypernatremia: Secondary | ICD-10-CM

## 2017-01-13 NOTE — Telephone Encounter (Signed)
I don't see a new level that was drawn? Only the last one in the hospital, is there one in the mail box for the PCP ?

## 2017-01-13 NOTE — Telephone Encounter (Signed)
Patient requested a call regarding coumadin results Pt contact [phone] A voice message can be left on the answering machine

## 2017-01-16 NOTE — Telephone Encounter (Signed)
Last INR 01/05/17 , Coumadin 4 mg daily.  Patient wondering when to re- check.

## 2017-01-16 NOTE — Telephone Encounter (Signed)
Per verbal orders from Dr Derrel Nip next INR should be checked 2 weeks from endoscopy which was 4/19.  Patient is currently on Coumadin 4 mg daily since 01/05/17.  Patient should continue Coumadin 4 mg daily. Patient verbalized an understanding. Appointment scheduled for 01/18/17 due to patient had scheduling conflict with 10/28/00.

## 2017-01-16 NOTE — Telephone Encounter (Signed)
Pt called to follow up with what to do with taking her medication. Please advise? Thank you!

## 2017-01-17 ENCOUNTER — Other Ambulatory Visit: Payer: Self-pay | Admitting: Internal Medicine

## 2017-01-17 NOTE — Telephone Encounter (Signed)
All labs ordered.

## 2017-01-18 ENCOUNTER — Other Ambulatory Visit (INDEPENDENT_AMBULATORY_CARE_PROVIDER_SITE_OTHER): Payer: Medicare Other

## 2017-01-18 DIAGNOSIS — E119 Type 2 diabetes mellitus without complications: Secondary | ICD-10-CM

## 2017-01-18 DIAGNOSIS — D500 Iron deficiency anemia secondary to blood loss (chronic): Secondary | ICD-10-CM | POA: Diagnosis not present

## 2017-01-18 DIAGNOSIS — E870 Hyperosmolality and hypernatremia: Secondary | ICD-10-CM

## 2017-01-18 DIAGNOSIS — Z7901 Long term (current) use of anticoagulants: Secondary | ICD-10-CM | POA: Diagnosis not present

## 2017-01-18 LAB — CBC WITH DIFFERENTIAL/PLATELET
BASOS ABS: 0.1 10*3/uL (ref 0.0–0.1)
BASOS PCT: 1.3 % (ref 0.0–3.0)
EOS ABS: 0 10*3/uL (ref 0.0–0.7)
Eosinophils Relative: 0.5 % (ref 0.0–5.0)
HCT: 33.9 % — ABNORMAL LOW (ref 36.0–46.0)
Hemoglobin: 10.9 g/dL — ABNORMAL LOW (ref 12.0–15.0)
LYMPHS ABS: 1 10*3/uL (ref 0.7–4.0)
Lymphocytes Relative: 16 % (ref 12.0–46.0)
MCHC: 32.3 g/dL (ref 30.0–36.0)
MCV: 101.6 fl — ABNORMAL HIGH (ref 78.0–100.0)
MONO ABS: 0.4 10*3/uL (ref 0.1–1.0)
Monocytes Relative: 6.3 % (ref 3.0–12.0)
NEUTROS ABS: 4.9 10*3/uL (ref 1.4–7.7)
NEUTROS PCT: 75.9 % (ref 43.0–77.0)
PLATELETS: 415 10*3/uL — AB (ref 150.0–400.0)
RBC: 3.34 Mil/uL — ABNORMAL LOW (ref 3.87–5.11)
RDW: 17.3 % — AB (ref 11.5–15.5)
WBC: 6.5 10*3/uL (ref 4.0–10.5)

## 2017-01-18 LAB — PROTIME-INR
INR: 2.2 ratio — AB (ref 0.8–1.0)
Prothrombin Time: 23.6 s — ABNORMAL HIGH (ref 9.6–13.1)

## 2017-01-18 LAB — COMPREHENSIVE METABOLIC PANEL
ALBUMIN: 4.2 g/dL (ref 3.5–5.2)
ALT: 15 U/L (ref 0–35)
AST: 31 U/L (ref 0–37)
Alkaline Phosphatase: 78 U/L (ref 39–117)
BILIRUBIN TOTAL: 0.3 mg/dL (ref 0.2–1.2)
BUN: 27 mg/dL — ABNORMAL HIGH (ref 6–23)
CALCIUM: 9.8 mg/dL (ref 8.4–10.5)
CHLORIDE: 104 meq/L (ref 96–112)
CO2: 25 meq/L (ref 19–32)
CREATININE: 1.21 mg/dL — AB (ref 0.40–1.20)
GFR: 45.49 mL/min — AB (ref >60.00)
Glucose, Bld: 104 mg/dL — ABNORMAL HIGH (ref 70–99)
Potassium: 4.7 mEq/L (ref 3.5–5.1)
Sodium: 137 mEq/L (ref 135–145)
Total Protein: 7.7 g/dL (ref 6.0–8.3)

## 2017-01-19 ENCOUNTER — Other Ambulatory Visit: Payer: Medicare Other

## 2017-01-22 ENCOUNTER — Other Ambulatory Visit: Payer: Self-pay | Admitting: Internal Medicine

## 2017-01-24 DIAGNOSIS — J329 Chronic sinusitis, unspecified: Secondary | ICD-10-CM | POA: Diagnosis not present

## 2017-01-24 DIAGNOSIS — I5030 Unspecified diastolic (congestive) heart failure: Secondary | ICD-10-CM | POA: Diagnosis not present

## 2017-01-24 DIAGNOSIS — M3500 Sicca syndrome, unspecified: Secondary | ICD-10-CM | POA: Diagnosis not present

## 2017-01-24 DIAGNOSIS — I2721 Secondary pulmonary arterial hypertension: Secondary | ICD-10-CM | POA: Diagnosis not present

## 2017-01-24 DIAGNOSIS — R0609 Other forms of dyspnea: Secondary | ICD-10-CM | POA: Diagnosis not present

## 2017-01-24 DIAGNOSIS — I4891 Unspecified atrial fibrillation: Secondary | ICD-10-CM | POA: Diagnosis not present

## 2017-01-24 DIAGNOSIS — K449 Diaphragmatic hernia without obstruction or gangrene: Secondary | ICD-10-CM | POA: Diagnosis not present

## 2017-01-24 DIAGNOSIS — Z882 Allergy status to sulfonamides status: Secondary | ICD-10-CM | POA: Diagnosis not present

## 2017-01-24 DIAGNOSIS — Z9010 Acquired absence of unspecified breast and nipple: Secondary | ICD-10-CM | POA: Diagnosis not present

## 2017-01-24 DIAGNOSIS — K5730 Diverticulosis of large intestine without perforation or abscess without bleeding: Secondary | ICD-10-CM | POA: Diagnosis not present

## 2017-01-24 DIAGNOSIS — Z885 Allergy status to narcotic agent status: Secondary | ICD-10-CM | POA: Diagnosis not present

## 2017-01-24 DIAGNOSIS — M329 Systemic lupus erythematosus, unspecified: Secondary | ICD-10-CM | POA: Diagnosis not present

## 2017-01-24 DIAGNOSIS — Z881 Allergy status to other antibiotic agents status: Secondary | ICD-10-CM | POA: Diagnosis not present

## 2017-01-24 DIAGNOSIS — I2722 Pulmonary hypertension due to left heart disease: Secondary | ICD-10-CM | POA: Diagnosis not present

## 2017-01-24 DIAGNOSIS — E039 Hypothyroidism, unspecified: Secondary | ICD-10-CM | POA: Diagnosis not present

## 2017-01-24 DIAGNOSIS — Z87891 Personal history of nicotine dependence: Secondary | ICD-10-CM | POA: Diagnosis not present

## 2017-01-24 DIAGNOSIS — K210 Gastro-esophageal reflux disease with esophagitis: Secondary | ICD-10-CM | POA: Diagnosis not present

## 2017-01-24 DIAGNOSIS — Z923 Personal history of irradiation: Secondary | ICD-10-CM | POA: Diagnosis not present

## 2017-01-24 DIAGNOSIS — C50919 Malignant neoplasm of unspecified site of unspecified female breast: Secondary | ICD-10-CM | POA: Diagnosis not present

## 2017-01-24 DIAGNOSIS — Z91041 Radiographic dye allergy status: Secondary | ICD-10-CM | POA: Diagnosis not present

## 2017-01-24 DIAGNOSIS — R5383 Other fatigue: Secondary | ICD-10-CM | POA: Diagnosis not present

## 2017-01-24 DIAGNOSIS — G8929 Other chronic pain: Secondary | ICD-10-CM | POA: Diagnosis not present

## 2017-01-24 DIAGNOSIS — K219 Gastro-esophageal reflux disease without esophagitis: Secondary | ICD-10-CM | POA: Diagnosis not present

## 2017-01-24 DIAGNOSIS — R0600 Dyspnea, unspecified: Secondary | ICD-10-CM | POA: Diagnosis not present

## 2017-01-24 DIAGNOSIS — J449 Chronic obstructive pulmonary disease, unspecified: Secondary | ICD-10-CM | POA: Diagnosis not present

## 2017-01-24 DIAGNOSIS — M549 Dorsalgia, unspecified: Secondary | ICD-10-CM | POA: Diagnosis not present

## 2017-01-25 DIAGNOSIS — J320 Chronic maxillary sinusitis: Secondary | ICD-10-CM | POA: Diagnosis not present

## 2017-02-02 IMAGING — CR DG ABDOMEN 2V
2 series · 2 of 2 positions shown · non-contrast
Comparison: 03/13/2015

CLINICAL DATA: Post laparotomy and 03/11/2015

EXAM:
ABDOMEN - 2 VIEW

[abdomen erect]
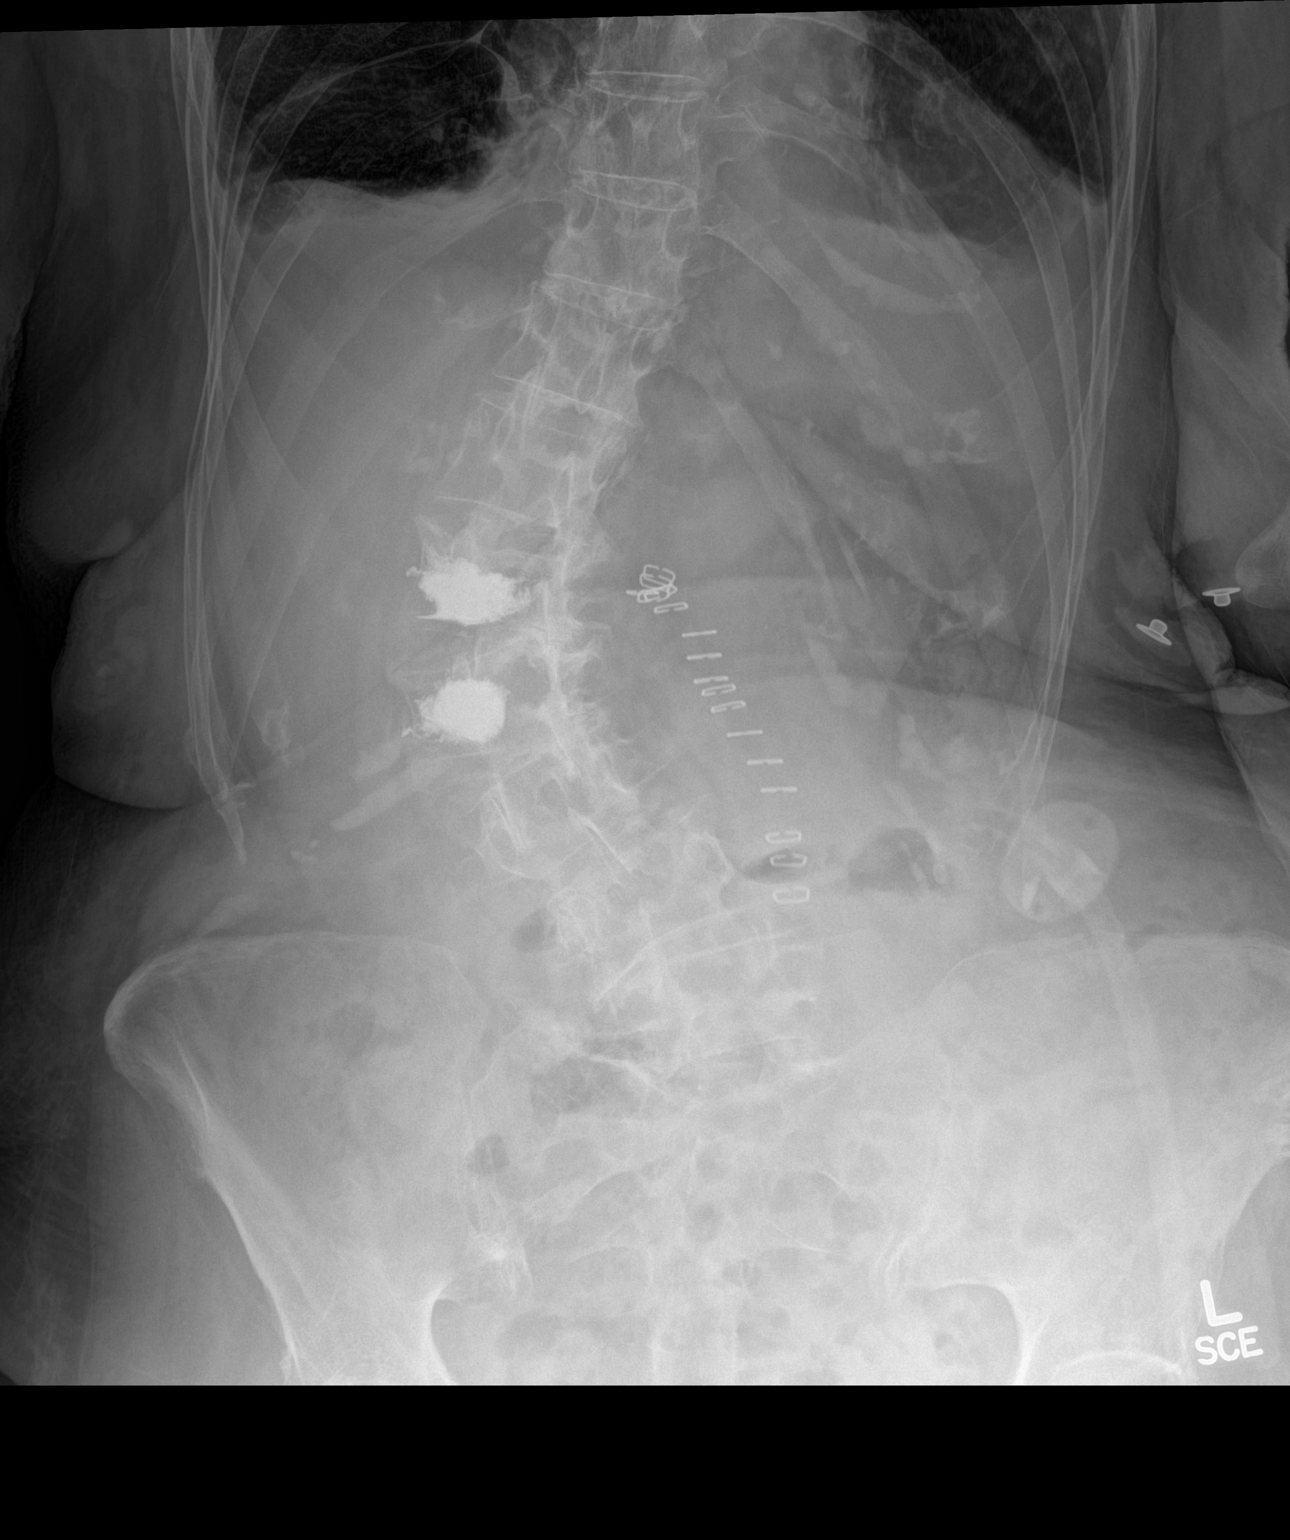

[abdomen supine]
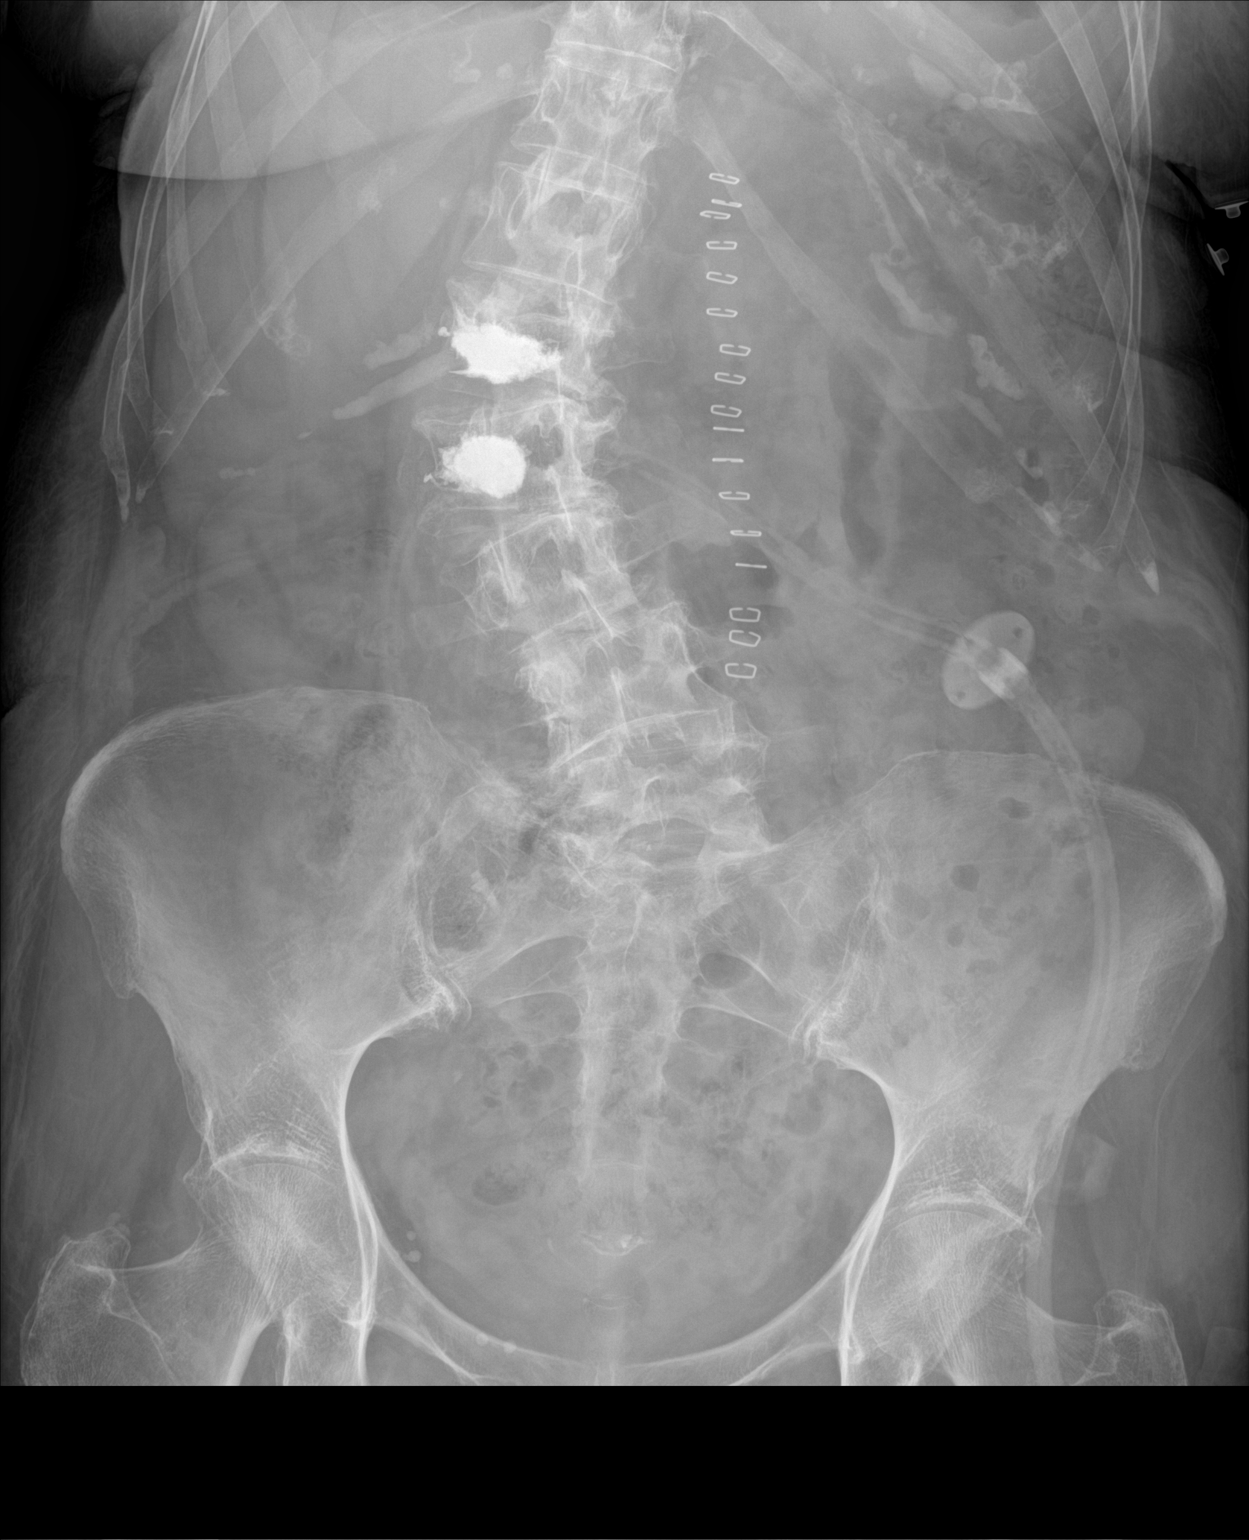

[2 of 2 positions shown; findings below may reference images not displayed]

FINDINGS: Skin clips from prior laparotomy.

Gastrostomy tube unchanged.

Nonobstructive bowel gas pattern.

No bowel dilatation, bowel wall thickening or free air.

Osseous demineralization with dextro convex scoliosis and prior
spinal augmentation procedures at adjacent levels of the lumbar
spine.

No urinary tract calcification.

No free intraperitoneal air.

Bibasilar effusions and atelectasis.
IMPRESSION: Normal bowel gas pattern.

## 2017-02-04 IMAGING — RF DG UGI W/ KUB
10 of 15 series · 14 of 24 positions shown · non-contrast
Comparison: CT scan dated March 11, 2015

CLINICAL DATA: Large partially intra thoracic stomach with recent
adhesion release and pull down procedure ; persistent nausea.

EXAM:
UPPER GI SERIES WITHOUT KUB
TECHNIQUE: Routine upper GI series was performed with thin barium. The patient
was nauseated at the beginning of the study and the volume of barium
that could be administered was limited.
FLUOROSCOPY TIME:  Fluoroscopy Time (in minutes and seconds): 1
minutes, 6 seconds
Number of Acquired Images:  20 including video clips

[Series 1: t abdomen supine · 0.15mm/px · 1 of 1 slices shown]
[im 1/1]
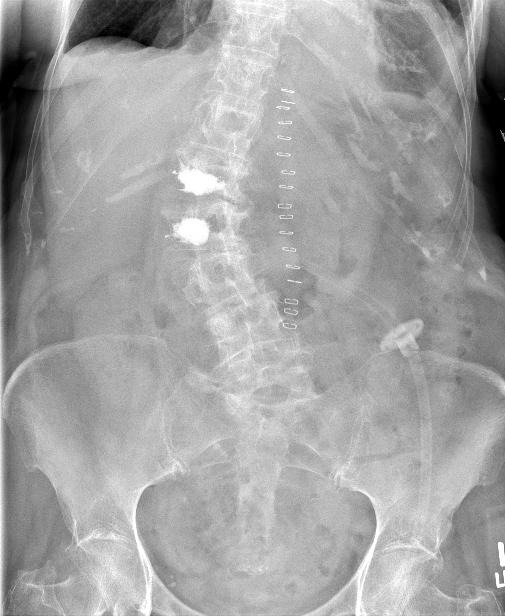

[Series 3: fluoro_barium singleshot_bw · 0.19mm/px · 1 of 1 slices shown (1 of 5)]
[im 1/1]
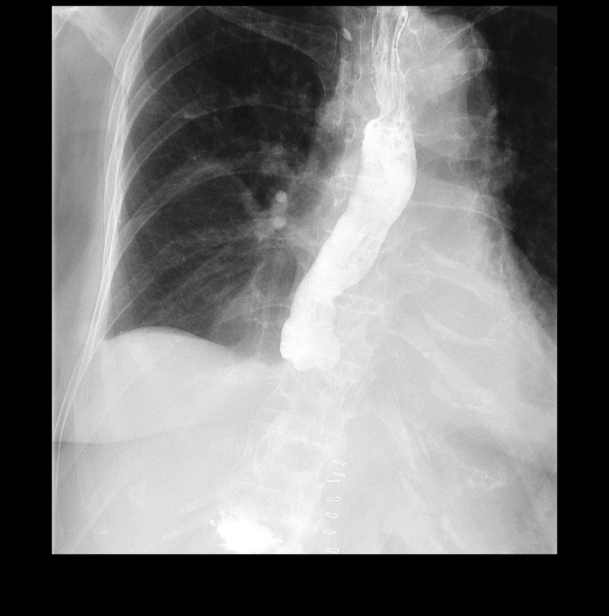

[Series 5: fluoro_barium singleshot_bw · 0.19mm/px · 1 of 1 slices shown (2 of 5)]
[im 1/1]
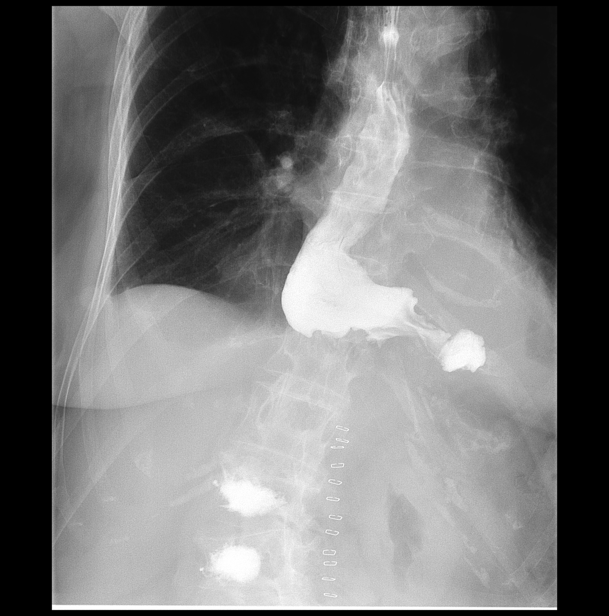

[Series 8: fluoro_barium singleshot_bw · 0.19mm/px · 1 of 1 slices shown (3 of 5)]
[im 1/1]
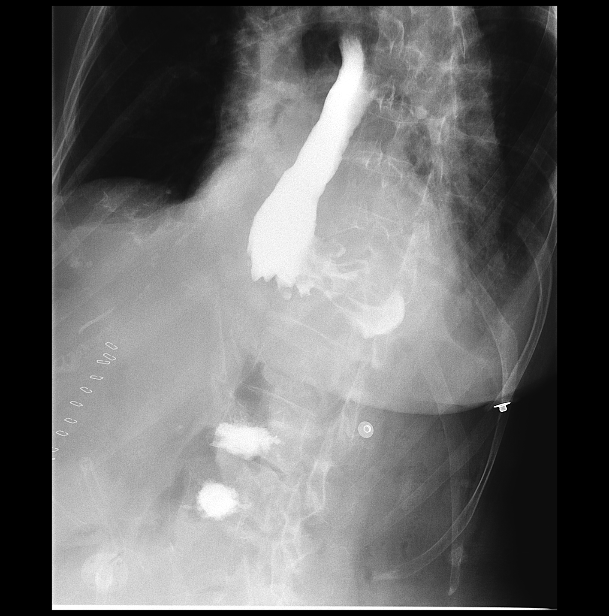

[Series 9: fluoro_barium singleshot_bw · 0.19mm/px · 1 of 1 slices shown (4 of 5)]
[im 1/1]
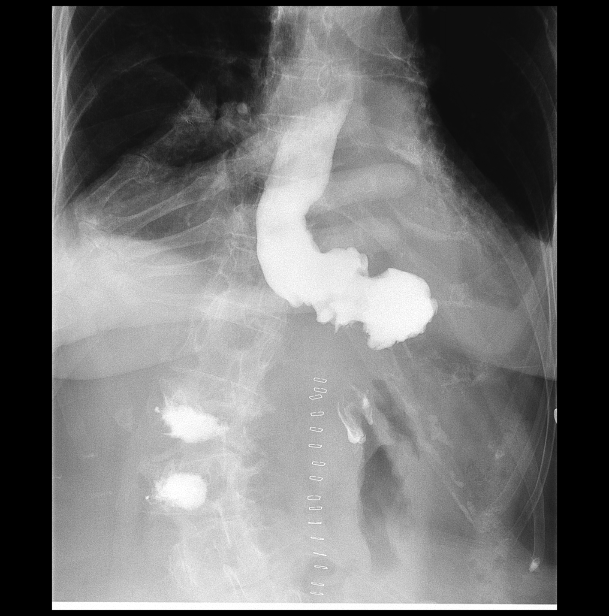

[Series 11: fluoro_barium 2fps_bw · 0.19mm/px · 3 of 16 frames shown (1 of 4)]
[frame 2/16]
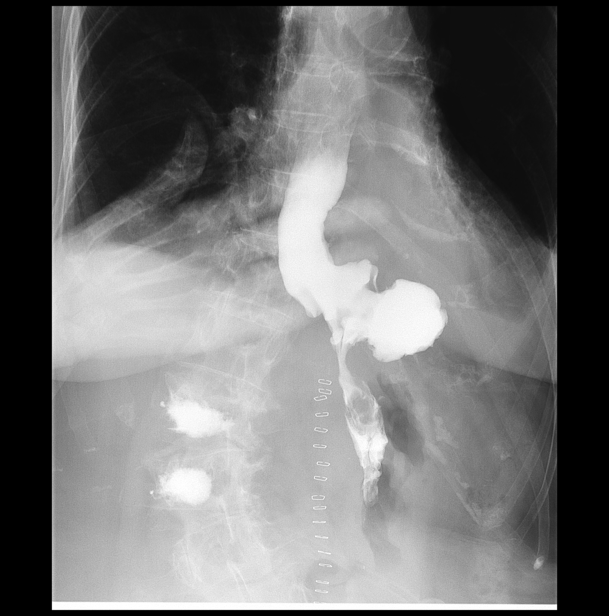
[frame 9/16]
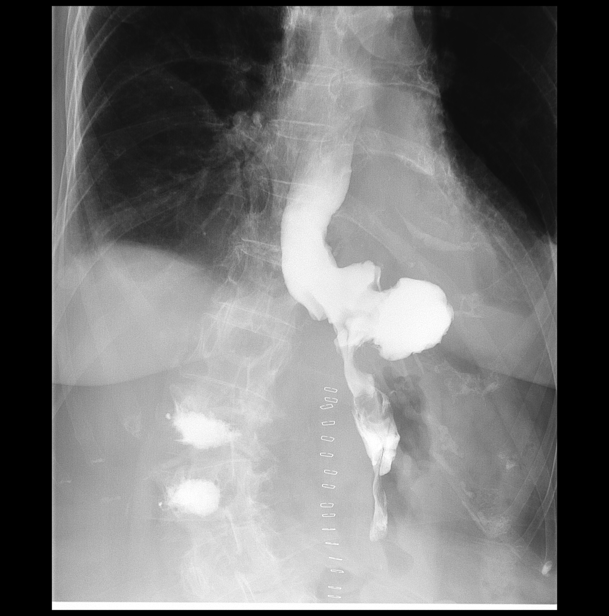
[frame 14/16]
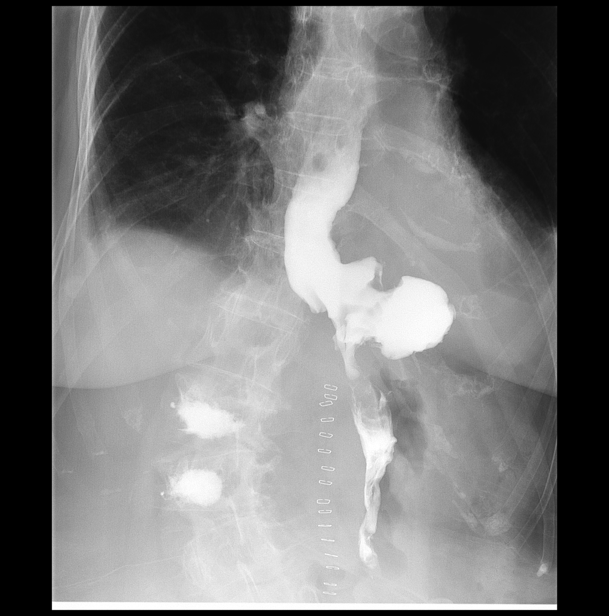

[Series 12: fluoro_barium 2fps_bw · 0.19mm/px · 2 of 12 frames shown (2 of 4)]
[frame 5/12]
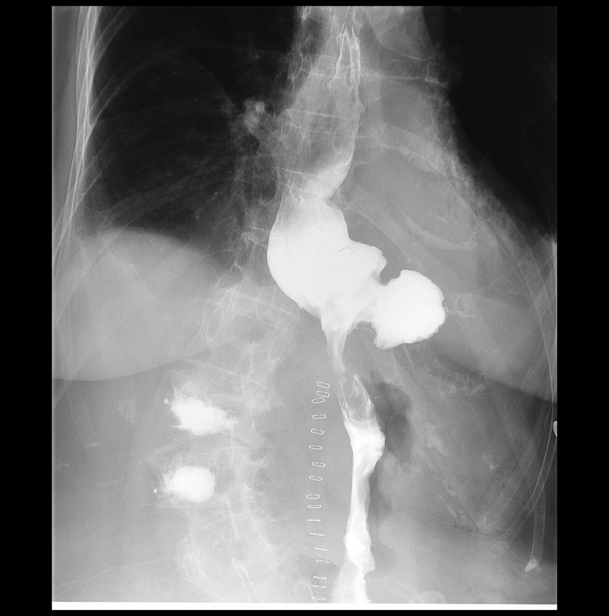
[frame 11/12]
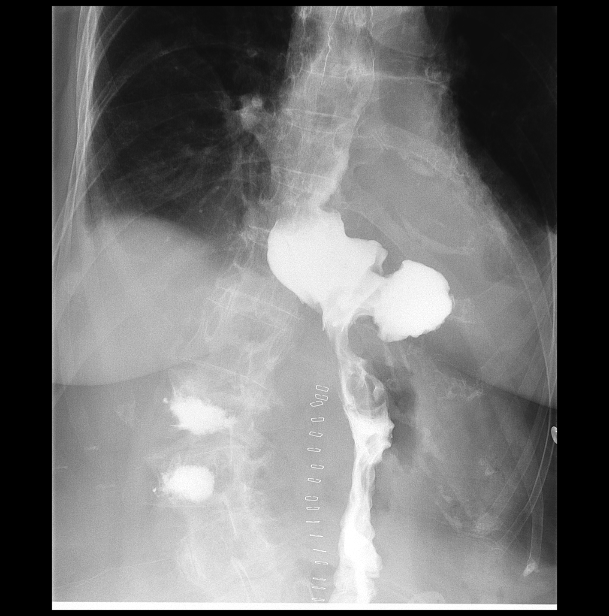

[Series 13: fluoro_barium 2fps_bw · 0.19mm/px · 1 of 6 frames shown (3 of 4)]
[frame 6/6]
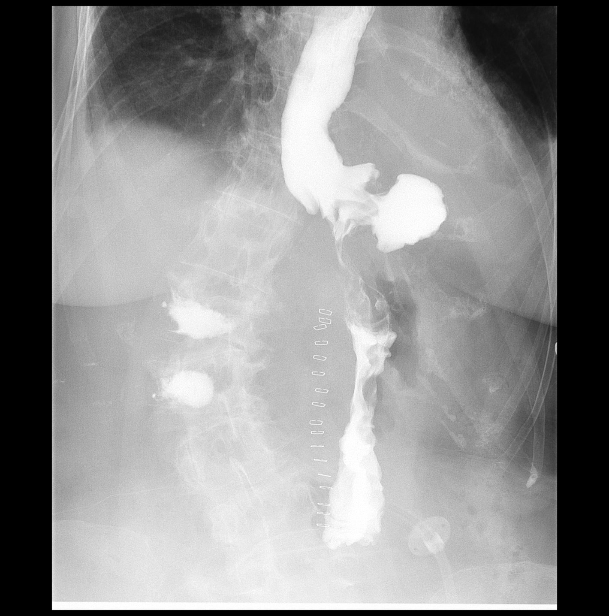

[Series 14: fluoro_barium 2fps_bw · 0.19mm/px · 2 of 4 frames shown (4 of 4)]
[frame 1/4]
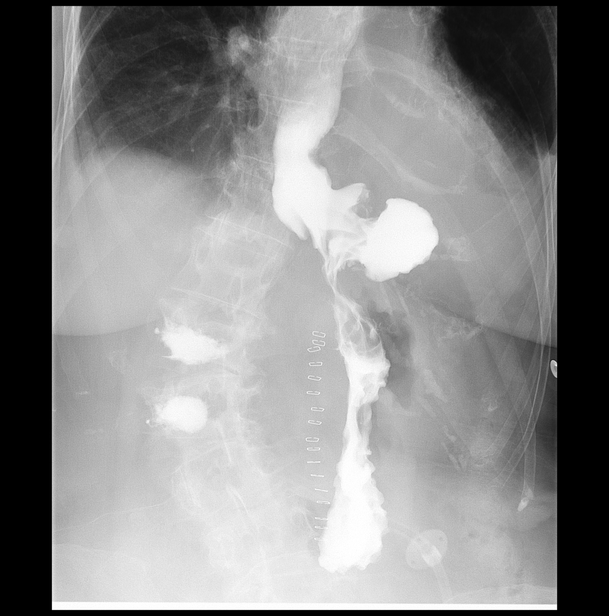
[frame 4/4]
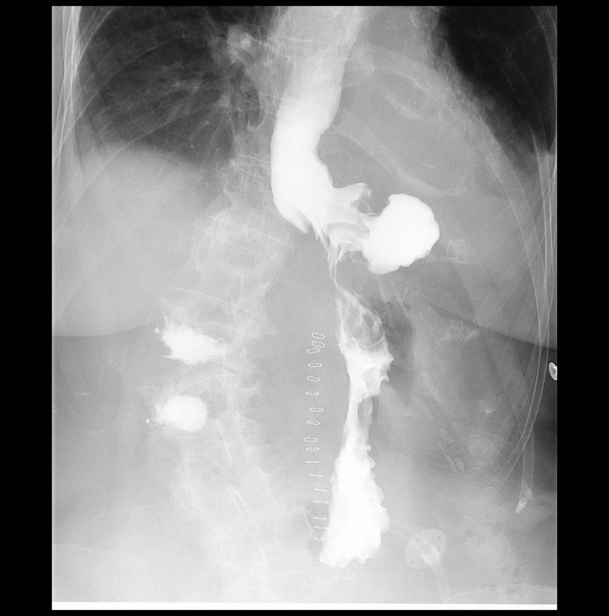

[Series 16: fluoro_barium singleshot_bw · 0.19mm/px · 1 of 1 slices shown (5 of 5)]
[im 1/1]
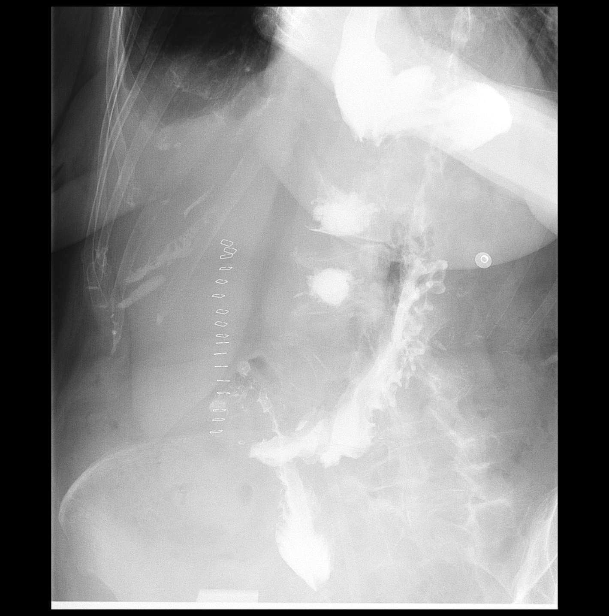

[14 of 24 positions shown; findings below may reference images not displayed]

FINDINGS: The scout image revealed no evidence of gastric or bowel
obstruction. The Moss gastrostomy tube is visible. The patient
ingested barium which passed to the distal esophagus and into the
intra thoracic portion of the stomach without difficulty. However,
in the semi supine position the barium did not progress more
inferiorly. When the patient was brought to a semi upright position
there was then drainage through the esophageal hiatus into the
abdomen intra-abdominal portion of the stomach. Significant
distention of the intra-abdominal portion of the stomach was not
obtained. When the patient was turned to the right side decubitus
position emptying of the stomach into the proximal duodenum
occurred.
IMPRESSION: 1. A portion of the stomach remains above the hemidiaphragm.
Emptying of the intra thoracic portion of the stomach is delayed
until the patient assumes the semi upright position. At that point
drainage of the intra thoracic portion into the intra-abdominal
portion occurs through a mildly narrowed channel through the aortic
hiatus.
2. Drainage of the intra-abdominal portion of the stomach into the
duodenum occurs promptly when the patient assumes the
right-side-down decubitus position.
3. Throughout the procedure the patient experienced sensation of
nausea when in the semi recumbent position which was partially
relieved by assuming the semi erect position.

## 2017-02-21 ENCOUNTER — Other Ambulatory Visit: Payer: Self-pay | Admitting: Radiology

## 2017-02-21 DIAGNOSIS — Z7901 Long term (current) use of anticoagulants: Principal | ICD-10-CM

## 2017-02-21 DIAGNOSIS — Z5181 Encounter for therapeutic drug level monitoring: Secondary | ICD-10-CM

## 2017-02-22 ENCOUNTER — Other Ambulatory Visit (INDEPENDENT_AMBULATORY_CARE_PROVIDER_SITE_OTHER): Payer: Medicare Other

## 2017-02-22 DIAGNOSIS — Z7901 Long term (current) use of anticoagulants: Secondary | ICD-10-CM

## 2017-02-22 DIAGNOSIS — Z5181 Encounter for therapeutic drug level monitoring: Secondary | ICD-10-CM

## 2017-02-22 LAB — PROTIME-INR
INR: 4.4 ratio — AB (ref 0.8–1.0)
PROTHROMBIN TIME: 46.8 s — AB (ref 9.6–13.1)

## 2017-02-24 ENCOUNTER — Other Ambulatory Visit: Payer: Self-pay | Admitting: Internal Medicine

## 2017-02-24 MED ORDER — WARFARIN SODIUM 3 MG PO TABS: 3.0000 mg | ORAL_TABLET | Freq: Every day | ORAL | 0 refills | Status: DC

## 2017-02-28 ENCOUNTER — Telehealth: Payer: Self-pay | Admitting: *Deleted

## 2017-02-28 DIAGNOSIS — Z7901 Long term (current) use of anticoagulants: Secondary | ICD-10-CM

## 2017-02-28 NOTE — Telephone Encounter (Signed)
Standing PT/INR order placed

## 2017-03-01 ENCOUNTER — Other Ambulatory Visit (INDEPENDENT_AMBULATORY_CARE_PROVIDER_SITE_OTHER): Payer: Medicare Other

## 2017-03-01 DIAGNOSIS — Z7901 Long term (current) use of anticoagulants: Secondary | ICD-10-CM | POA: Diagnosis not present

## 2017-03-01 LAB — PROTIME-INR
INR: 1.4 ratio — AB (ref 0.8–1.0)
Prothrombin Time: 15.3 s — ABNORMAL HIGH (ref 9.6–13.1)

## 2017-03-02 DIAGNOSIS — Z885 Allergy status to narcotic agent status: Secondary | ICD-10-CM | POA: Diagnosis not present

## 2017-03-02 DIAGNOSIS — M3214 Glomerular disease in systemic lupus erythematosus: Secondary | ICD-10-CM | POA: Diagnosis not present

## 2017-03-02 DIAGNOSIS — I5032 Chronic diastolic (congestive) heart failure: Secondary | ICD-10-CM | POA: Diagnosis not present

## 2017-03-02 DIAGNOSIS — M810 Age-related osteoporosis without current pathological fracture: Secondary | ICD-10-CM | POA: Diagnosis not present

## 2017-03-02 DIAGNOSIS — I4891 Unspecified atrial fibrillation: Secondary | ICD-10-CM | POA: Diagnosis not present

## 2017-03-02 DIAGNOSIS — E039 Hypothyroidism, unspecified: Secondary | ICD-10-CM | POA: Diagnosis not present

## 2017-03-02 DIAGNOSIS — Z79811 Long term (current) use of aromatase inhibitors: Secondary | ICD-10-CM | POA: Diagnosis not present

## 2017-03-02 DIAGNOSIS — Z79899 Other long term (current) drug therapy: Secondary | ICD-10-CM | POA: Diagnosis not present

## 2017-03-02 DIAGNOSIS — I272 Pulmonary hypertension, unspecified: Secondary | ICD-10-CM | POA: Diagnosis not present

## 2017-03-02 DIAGNOSIS — M25519 Pain in unspecified shoulder: Secondary | ICD-10-CM | POA: Diagnosis not present

## 2017-03-02 DIAGNOSIS — G8929 Other chronic pain: Secondary | ICD-10-CM | POA: Diagnosis not present

## 2017-03-02 DIAGNOSIS — Z7952 Long term (current) use of systemic steroids: Secondary | ICD-10-CM | POA: Diagnosis not present

## 2017-03-02 DIAGNOSIS — Z86718 Personal history of other venous thrombosis and embolism: Secondary | ICD-10-CM | POA: Diagnosis not present

## 2017-03-02 DIAGNOSIS — Z7901 Long term (current) use of anticoagulants: Secondary | ICD-10-CM | POA: Diagnosis not present

## 2017-03-02 DIAGNOSIS — Z882 Allergy status to sulfonamides status: Secondary | ICD-10-CM | POA: Diagnosis not present

## 2017-03-02 DIAGNOSIS — Z880 Allergy status to penicillin: Secondary | ICD-10-CM | POA: Diagnosis not present

## 2017-03-07 DIAGNOSIS — Z931 Gastrostomy status: Secondary | ICD-10-CM | POA: Diagnosis not present

## 2017-03-07 DIAGNOSIS — A048 Other specified bacterial intestinal infections: Secondary | ICD-10-CM | POA: Diagnosis not present

## 2017-03-07 DIAGNOSIS — K449 Diaphragmatic hernia without obstruction or gangrene: Secondary | ICD-10-CM | POA: Diagnosis not present

## 2017-03-08 ENCOUNTER — Other Ambulatory Visit (INDEPENDENT_AMBULATORY_CARE_PROVIDER_SITE_OTHER): Payer: Medicare Other

## 2017-03-08 DIAGNOSIS — Z7901 Long term (current) use of anticoagulants: Secondary | ICD-10-CM | POA: Diagnosis not present

## 2017-03-08 LAB — PROTIME-INR
INR: 1.9 ratio — ABNORMAL HIGH (ref 0.8–1.0)
Prothrombin Time: 20.1 s — ABNORMAL HIGH (ref 9.6–13.1)

## 2017-03-09 ENCOUNTER — Other Ambulatory Visit: Payer: Self-pay | Admitting: Internal Medicine

## 2017-03-09 DIAGNOSIS — Z7901 Long term (current) use of anticoagulants: Principal | ICD-10-CM

## 2017-03-09 DIAGNOSIS — Z5181 Encounter for therapeutic drug level monitoring: Secondary | ICD-10-CM

## 2017-03-09 NOTE — Progress Notes (Signed)
inr

## 2017-03-16 ENCOUNTER — Other Ambulatory Visit (INDEPENDENT_AMBULATORY_CARE_PROVIDER_SITE_OTHER): Payer: Medicare Other

## 2017-03-16 DIAGNOSIS — Z7901 Long term (current) use of anticoagulants: Secondary | ICD-10-CM

## 2017-03-16 LAB — PROTIME-INR
INR: 3.1 ratio — ABNORMAL HIGH (ref 0.8–1.0)
PROTHROMBIN TIME: 32.7 s — AB (ref 9.6–13.1)

## 2017-03-21 NOTE — Telephone Encounter (Signed)
done

## 2017-03-28 ENCOUNTER — Other Ambulatory Visit: Payer: Self-pay | Admitting: Internal Medicine

## 2017-03-29 ENCOUNTER — Other Ambulatory Visit (INDEPENDENT_AMBULATORY_CARE_PROVIDER_SITE_OTHER): Payer: Medicare Other

## 2017-03-29 ENCOUNTER — Ambulatory Visit: Payer: Medicare Other | Admitting: Internal Medicine

## 2017-03-29 DIAGNOSIS — Z5181 Encounter for therapeutic drug level monitoring: Secondary | ICD-10-CM | POA: Diagnosis not present

## 2017-03-29 DIAGNOSIS — Z7901 Long term (current) use of anticoagulants: Secondary | ICD-10-CM | POA: Diagnosis not present

## 2017-03-29 LAB — PROTIME-INR
INR: 2.3 ratio — AB (ref 0.8–1.0)
Prothrombin Time: 25.1 s — ABNORMAL HIGH (ref 9.6–13.1)

## 2017-04-10 ENCOUNTER — Ambulatory Visit
Admission: RE | Admit: 2017-04-10 | Discharge: 2017-04-10 | Disposition: A | Discharge status: Home / Self Care | Payer: MEDICARE | Admitting: Rheumatology

## 2017-04-10 DIAGNOSIS — M3214 Glomerular disease in systemic lupus erythematosus: Principal | ICD-10-CM

## 2017-04-10 MED ORDER — HYDROXYCHLOROQUINE 200 MG TABLET
ORAL_TABLET | Freq: Every day | ORAL | 3 refills | 0.00000 days | Status: CP
Start: 2017-04-10 — End: 2018-04-25

## 2017-04-10 MED ORDER — PREDNISONE 5 MG TABLET
ORAL_TABLET | Freq: Every day | ORAL | 5 refills | 0 days | Status: CP
Start: 2017-04-10 — End: 2017-11-13

## 2017-04-16 ENCOUNTER — Other Ambulatory Visit: Payer: Self-pay | Admitting: Internal Medicine

## 2017-04-17 ENCOUNTER — Other Ambulatory Visit: Payer: Self-pay | Admitting: *Deleted

## 2017-04-17 NOTE — Telephone Encounter (Signed)
Refilled: 12/28/2016 Last OV: 12/28/2016 Next OV: 05/11/2017
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
## 2017-04-18 MED ORDER — OXYCODONE-ACETAMINOPHEN 5-325 MG PO TABS: 1.0000 | ORAL_TABLET | Freq: Two times a day (BID) | ORAL | 0 refills | Status: DC | PRN

## 2017-04-18 NOTE — Telephone Encounter (Signed)
Authorized must keep august 23 appt .
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
## 2017-04-18 NOTE — Telephone Encounter (Signed)
Printed, signed and faxed.  

## 2017-04-29 ENCOUNTER — Other Ambulatory Visit: Payer: Self-pay | Admitting: Internal Medicine

## 2017-05-03 ENCOUNTER — Other Ambulatory Visit (INDEPENDENT_AMBULATORY_CARE_PROVIDER_SITE_OTHER): Payer: Medicare Other

## 2017-05-03 DIAGNOSIS — Z7901 Long term (current) use of anticoagulants: Secondary | ICD-10-CM | POA: Diagnosis not present

## 2017-05-03 LAB — PROTIME-INR
INR: 1.8 ratio — ABNORMAL HIGH (ref 0.8–1.0)
Prothrombin Time: 19.3 s — ABNORMAL HIGH (ref 9.6–13.1)

## 2017-05-05 MED ORDER — TADALAFIL 20 MG TABLET (PULMONARY HYPERTENSION)
ORAL_TABLET | Freq: Every day | ORAL | 11 refills | 0.00000 days | Status: CP
Start: 2017-05-05 — End: 2018-04-05

## 2017-05-11 ENCOUNTER — Ambulatory Visit (INDEPENDENT_AMBULATORY_CARE_PROVIDER_SITE_OTHER): Payer: Medicare Other | Admitting: Internal Medicine

## 2017-05-11 ENCOUNTER — Encounter: Payer: Self-pay | Admitting: Internal Medicine

## 2017-05-11 VITALS — BP 122/80 | HR 94 | Temp 97.7°F | Resp 17 | Ht 62.0 in | Wt 127.8 lb

## 2017-05-11 DIAGNOSIS — Z7901 Long term (current) use of anticoagulants: Secondary | ICD-10-CM | POA: Diagnosis not present

## 2017-05-11 DIAGNOSIS — E119 Type 2 diabetes mellitus without complications: Secondary | ICD-10-CM

## 2017-05-11 DIAGNOSIS — B258 Other cytomegaloviral diseases: Secondary | ICD-10-CM

## 2017-05-11 DIAGNOSIS — I2729 Other secondary pulmonary hypertension: Secondary | ICD-10-CM

## 2017-05-11 DIAGNOSIS — M5430 Sciatica, unspecified side: Secondary | ICD-10-CM

## 2017-05-11 DIAGNOSIS — K208 Other esophagitis without bleeding: Secondary | ICD-10-CM

## 2017-05-11 DIAGNOSIS — B001 Herpesviral vesicular dermatitis: Secondary | ICD-10-CM | POA: Diagnosis not present

## 2017-05-11 DIAGNOSIS — D518 Other vitamin B12 deficiency anemias: Secondary | ICD-10-CM

## 2017-05-11 DIAGNOSIS — E039 Hypothyroidism, unspecified: Secondary | ICD-10-CM | POA: Diagnosis not present

## 2017-05-11 MED ORDER — OXYCODONE-ACETAMINOPHEN 5-325 MG PO TABS: 1.0000 | ORAL_TABLET | Freq: Three times a day (TID) | ORAL | 0 refills | Status: DC | PRN

## 2017-05-11 MED ORDER — FAMCICLOVIR 500 MG PO TABS: 500.0000 mg | ORAL_TABLET | Freq: Three times a day (TID) | ORAL | 1 refills | Status: DC

## 2017-05-11 NOTE — Patient Instructions (Signed)
Return in one week for a INR check  I will send a message to Dr Gustavo Lah about getting your PEG tube removed  Start the famcyclovir today  Postpone your shingrix vaccine until you have been off of the anti viral for a week
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
## 2017-05-12 ENCOUNTER — Ambulatory Visit: Payer: Medicare Other | Admitting: Oncology

## 2017-05-12 ENCOUNTER — Other Ambulatory Visit: Payer: Medicare Other

## 2017-05-12 ENCOUNTER — Telehealth: Payer: Self-pay | Admitting: Internal Medicine

## 2017-05-12 ENCOUNTER — Other Ambulatory Visit: Payer: Self-pay | Admitting: Internal Medicine

## 2017-05-12 NOTE — Telephone Encounter (Signed)
Received order. It has been signed and faxed.

## 2017-05-12 NOTE — Telephone Encounter (Signed)
Pt called and stated that her oxygen tank ran out last night. She called the company to get more oxygen delivered to her but they advised her to doctor so they can write an rx for an overnight pulse ox. PT states that Dr. Derrel Nip did not order the o2 but the doctor she was using is no longer around. Please advise, thank you!  Call pt @ [facility] - Phone [phone]

## 2017-05-12 NOTE — Progress Notes (Signed)
I will try to get her in this week.  I have been concerned about removind if still functional, especially since she has had recurrent CMV esophagitis.  We will contact her to facilitate.

## 2017-05-13 DIAGNOSIS — B001 Herpesviral vesicular dermatitis: Secondary | ICD-10-CM | POA: Insufficient documentation

## 2017-05-13 NOTE — Assessment & Plan Note (Signed)
Complicated by sarcoid lung,  She has nocturnal desaturations requiring use of supplemental oxygen by nasal cannula.

## 2017-05-13 NOTE — Assessment & Plan Note (Signed)
Her last INR was low at 1.8  On a weekly dose of 28 mg weekly .  Dose has been increased to 30 mg weekly and repeat INR in 2 weeks.

## 2017-05-13 NOTE — Assessment & Plan Note (Signed)
Her pain is persistent despite ESI and nerve blocks and has been complicated by vertebral fracture,  It is controlled with  Percocet,  2 daily.  She has not had any ER visits  And has not requested any early refills.  Her Refill history was confirmed via [place] Controlled Substance database by me today during her visit and there have been no prescriptions of controlled substances filled from any providers other than me. . Refills for #60 tablets /month for 3 months given starting May 11 2016.

## 2017-05-13 NOTE — Assessment & Plan Note (Signed)
Her esophagitis has resolved and she has been able to sustain herself orally for over a month.  She has been unable to arrange removal of her PEG tube locally despite requesting it from [place] over a month ago through Dr Marton Redwood PA.

## 2017-05-13 NOTE — Assessment & Plan Note (Signed)
She has a cold sore on her lower lip.  famcyclovir prescribed.

## 2017-05-17 ENCOUNTER — Other Ambulatory Visit (INDEPENDENT_AMBULATORY_CARE_PROVIDER_SITE_OTHER): Payer: Medicare Other

## 2017-05-17 ENCOUNTER — Other Ambulatory Visit: Payer: Self-pay | Admitting: Internal Medicine

## 2017-05-17 DIAGNOSIS — C50412 Malignant neoplasm of upper-outer quadrant of left female breast: Secondary | ICD-10-CM | POA: Diagnosis not present

## 2017-05-17 DIAGNOSIS — E039 Hypothyroidism, unspecified: Secondary | ICD-10-CM

## 2017-05-17 DIAGNOSIS — D518 Other vitamin B12 deficiency anemias: Secondary | ICD-10-CM

## 2017-05-17 DIAGNOSIS — E119 Type 2 diabetes mellitus without complications: Secondary | ICD-10-CM

## 2017-05-17 DIAGNOSIS — Z7901 Long term (current) use of anticoagulants: Secondary | ICD-10-CM | POA: Diagnosis not present

## 2017-05-17 DIAGNOSIS — Z431 Encounter for attention to gastrostomy: Secondary | ICD-10-CM | POA: Diagnosis not present

## 2017-05-17 LAB — COMPREHENSIVE METABOLIC PANEL
ALBUMIN: 3.8 g/dL (ref 3.5–5.2)
ALK PHOS: 63 U/L (ref 39–117)
ALT: 7 U/L (ref 0–35)
AST: 16 U/L (ref 0–37)
BILIRUBIN TOTAL: 0.3 mg/dL (ref 0.2–1.2)
BUN: 24 mg/dL — ABNORMAL HIGH (ref 6–23)
CALCIUM: 9.2 mg/dL (ref 8.4–10.5)
CHLORIDE: 106 meq/L (ref 96–112)
CO2: 24 mEq/L (ref 19–32)
CREATININE: 1 mg/dL (ref 0.40–1.20)
GFR: 56.64 mL/min — ABNORMAL LOW (ref >60.00)
GLUCOSE: 145 mg/dL — AB (ref 70–99)
POTASSIUM: 4.2 meq/L (ref 3.5–5.1)
Sodium: 138 mEq/L (ref 135–145)
TOTAL PROTEIN: 6.9 g/dL (ref 6.0–8.3)

## 2017-05-17 LAB — CBC WITH DIFFERENTIAL/PLATELET
BASOS ABS: 0.1 10*3/uL (ref 0.0–0.1)
BASOS PCT: 1.5 % (ref 0.0–3.0)
Eosinophils Absolute: 0 10*3/uL (ref 0.0–0.7)
Eosinophils Relative: 0.9 % (ref 0.0–5.0)
HCT: 33 % — ABNORMAL LOW (ref 36.0–46.0)
Hemoglobin: 10.5 g/dL — ABNORMAL LOW (ref 12.0–15.0)
LYMPHS ABS: 1 10*3/uL (ref 0.7–4.0)
Lymphocytes Relative: 21.5 % (ref 12.0–46.0)
MCHC: 31.9 g/dL (ref 30.0–36.0)
MCV: 95.7 fl (ref 78.0–100.0)
MONOS PCT: 5.1 % (ref 3.0–12.0)
Monocytes Absolute: 0.2 10*3/uL (ref 0.1–1.0)
NEUTROS ABS: 3.5 10*3/uL (ref 1.4–7.7)
NEUTROS PCT: 71 % (ref 43.0–77.0)
PLATELETS: 325 10*3/uL (ref 150.0–400.0)
RBC: 3.45 Mil/uL — ABNORMAL LOW (ref 3.87–5.11)
RDW: 14.8 % (ref 11.5–15.5)
WBC: 4.9 10*3/uL (ref 4.0–10.5)

## 2017-05-17 LAB — HEMOGLOBIN A1C: Hgb A1c MFr Bld: 6.3 % (ref 4.6–6.5)

## 2017-05-17 LAB — PROTIME-INR
INR: 2.9 ratio — ABNORMAL HIGH (ref 0.8–1.0)
PROTHROMBIN TIME: 30.9 s — AB (ref 9.6–13.1)

## 2017-05-17 LAB — VITAMIN B12: Vitamin B-12: 426 pg/mL (ref 211–911)

## 2017-05-17 LAB — TSH: TSH: 0.12 u[IU]/mL — ABNORMAL LOW (ref 0.35–4.50)

## 2017-05-17 MED ORDER — LEVOTHYROXINE SODIUM 150 MCG PO TABS: 150.0000 ug | ORAL_TABLET | Freq: Every day | ORAL | 3 refills | Status: DC

## 2017-05-18 ENCOUNTER — Encounter: Payer: Self-pay | Admitting: Internal Medicine

## 2017-05-18 ENCOUNTER — Telehealth: Payer: Self-pay

## 2017-05-18 DIAGNOSIS — I509 Heart failure, unspecified: Secondary | ICD-10-CM | POA: Diagnosis not present

## 2017-05-18 DIAGNOSIS — E039 Hypothyroidism, unspecified: Secondary | ICD-10-CM

## 2017-05-18 NOTE — Telephone Encounter (Signed)
Ordered TSH to have rechecked in 6 weeks. Lab appt has been scheduled.

## 2017-05-19 DIAGNOSIS — I509 Heart failure, unspecified: Secondary | ICD-10-CM | POA: Diagnosis not present

## 2017-05-23 ENCOUNTER — Other Ambulatory Visit: Payer: Self-pay

## 2017-05-23 DIAGNOSIS — C50412 Malignant neoplasm of upper-outer quadrant of left female breast: Secondary | ICD-10-CM

## 2017-05-25 NOTE — Telephone Encounter (Signed)
Order for supplemental 02 signed and qualifying overnight pulse oximetry report attached to order .  Please make a copy of both to be scanned into chart

## 2017-05-26 ENCOUNTER — Inpatient Hospital Stay (HOSPITAL_BASED_OUTPATIENT_CLINIC_OR_DEPARTMENT_OTHER): Payer: Medicare Other | Admitting: Oncology

## 2017-05-26 ENCOUNTER — Inpatient Hospital Stay: Payer: Medicare Other | Attending: Oncology

## 2017-05-26 ENCOUNTER — Other Ambulatory Visit: Payer: Self-pay | Admitting: Internal Medicine

## 2017-05-26 VITALS — BP 137/76 | HR 77 | Temp 98.6°F | Resp 18 | Wt 127.5 lb

## 2017-05-26 DIAGNOSIS — I110 Hypertensive heart disease with heart failure: Secondary | ICD-10-CM | POA: Diagnosis not present

## 2017-05-26 DIAGNOSIS — N189 Chronic kidney disease, unspecified: Secondary | ICD-10-CM | POA: Insufficient documentation

## 2017-05-26 DIAGNOSIS — Z931 Gastrostomy status: Secondary | ICD-10-CM | POA: Diagnosis not present

## 2017-05-26 DIAGNOSIS — M8588 Other specified disorders of bone density and structure, other site: Secondary | ICD-10-CM | POA: Diagnosis not present

## 2017-05-26 DIAGNOSIS — I129 Hypertensive chronic kidney disease with stage 1 through stage 4 chronic kidney disease, or unspecified chronic kidney disease: Secondary | ICD-10-CM | POA: Insufficient documentation

## 2017-05-26 DIAGNOSIS — Z880 Allergy status to penicillin: Secondary | ICD-10-CM

## 2017-05-26 DIAGNOSIS — K219 Gastro-esophageal reflux disease without esophagitis: Secondary | ICD-10-CM | POA: Insufficient documentation

## 2017-05-26 DIAGNOSIS — I509 Heart failure, unspecified: Secondary | ICD-10-CM | POA: Insufficient documentation

## 2017-05-26 DIAGNOSIS — Z9081 Acquired absence of spleen: Secondary | ICD-10-CM

## 2017-05-26 DIAGNOSIS — Z79899 Other long term (current) drug therapy: Secondary | ICD-10-CM | POA: Insufficient documentation

## 2017-05-26 DIAGNOSIS — E785 Hyperlipidemia, unspecified: Secondary | ICD-10-CM | POA: Insufficient documentation

## 2017-05-26 DIAGNOSIS — M3500 Sicca syndrome, unspecified: Secondary | ICD-10-CM | POA: Insufficient documentation

## 2017-05-26 DIAGNOSIS — Z7952 Long term (current) use of systemic steroids: Secondary | ICD-10-CM | POA: Diagnosis not present

## 2017-05-26 DIAGNOSIS — Z923 Personal history of irradiation: Secondary | ICD-10-CM | POA: Diagnosis not present

## 2017-05-26 DIAGNOSIS — Z7901 Long term (current) use of anticoagulants: Secondary | ICD-10-CM | POA: Diagnosis not present

## 2017-05-26 DIAGNOSIS — D631 Anemia in chronic kidney disease: Secondary | ICD-10-CM | POA: Insufficient documentation

## 2017-05-26 DIAGNOSIS — I272 Pulmonary hypertension, unspecified: Secondary | ICD-10-CM | POA: Diagnosis not present

## 2017-05-26 DIAGNOSIS — D649 Anemia, unspecified: Secondary | ICD-10-CM

## 2017-05-26 DIAGNOSIS — Z87891 Personal history of nicotine dependence: Secondary | ICD-10-CM | POA: Insufficient documentation

## 2017-05-26 DIAGNOSIS — D539 Nutritional anemia, unspecified: Secondary | ICD-10-CM

## 2017-05-26 DIAGNOSIS — C50412 Malignant neoplasm of upper-outer quadrant of left female breast: Secondary | ICD-10-CM | POA: Diagnosis not present

## 2017-05-26 DIAGNOSIS — I4891 Unspecified atrial fibrillation: Secondary | ICD-10-CM | POA: Insufficient documentation

## 2017-05-26 DIAGNOSIS — Z79811 Long term (current) use of aromatase inhibitors: Secondary | ICD-10-CM

## 2017-05-26 DIAGNOSIS — Z170 Estrogen receptor positive status [ER+]: Secondary | ICD-10-CM | POA: Diagnosis not present

## 2017-05-26 DIAGNOSIS — Z801 Family history of malignant neoplasm of trachea, bronchus and lung: Secondary | ICD-10-CM

## 2017-05-26 DIAGNOSIS — G629 Polyneuropathy, unspecified: Secondary | ICD-10-CM | POA: Insufficient documentation

## 2017-05-26 DIAGNOSIS — Z800 Family history of malignant neoplasm of digestive organs: Secondary | ICD-10-CM | POA: Insufficient documentation

## 2017-05-26 LAB — COMPREHENSIVE METABOLIC PANEL
ALK PHOS: 63 U/L (ref 38–126)
ALT: 10 U/L — ABNORMAL LOW (ref 14–54)
ANION GAP: 10 (ref 5–15)
AST: 22 U/L (ref 15–41)
Albumin: 3.7 g/dL (ref 3.5–5.0)
BUN: 22 mg/dL — ABNORMAL HIGH (ref 6–20)
CO2: 23 mmol/L (ref 22–32)
CREATININE: 1.26 mg/dL — AB (ref 0.44–1.00)
Calcium: 9.2 mg/dL (ref 8.9–10.3)
Chloride: 107 mmol/L (ref 101–111)
GFR, EST AFRICAN AMERICAN: 45 mL/min — AB (ref >60)
GFR, EST NON AFRICAN AMERICAN: 39 mL/min — AB (ref >60)
Glucose, Bld: 122 mg/dL — ABNORMAL HIGH (ref 65–99)
Potassium: 4.1 mmol/L (ref 3.5–5.1)
SODIUM: 140 mmol/L (ref 135–145)
Total Bilirubin: 0.4 mg/dL (ref 0.3–1.2)
Total Protein: 7.1 g/dL (ref 6.5–8.1)

## 2017-05-26 LAB — CBC
HCT: 32.4 % — ABNORMAL LOW (ref 35.0–47.0)
HEMOGLOBIN: 10.7 g/dL — AB (ref 12.0–16.0)
MCH: 30.8 pg (ref 26.0–34.0)
MCHC: 33 g/dL (ref 32.0–36.0)
MCV: 93.3 fL (ref 80.0–100.0)
Platelets: 333 10*3/uL (ref 150–440)
RBC: 3.47 MIL/uL — ABNORMAL LOW (ref 3.80–5.20)
RDW: 15.3 % — ABNORMAL HIGH (ref 11.5–14.5)
WBC: 8.1 10*3/uL (ref 3.6–11.0)

## 2017-05-26 NOTE — Progress Notes (Signed)
Hematology/Oncology Consult note [facility]  [phone] [phone]  Patient Care Team: Sherlene Shams, MD as PCP - General (Internal Medicine) Lemar Livings, Merrily Pew, MD (General Surgery) Darci Current, MD as Attending Physician (Emergency Medicine) Pleasant, Dennard Schaumann, RN as [facility] Care Management   Name of the patient: Diana [name]  [id number]  27-Oct-1934   Date of visit: 05/26/17  Diagnosis- 1. Macrocytic anemia likely secondary to kidney disease and or underlying MDS 2. History of ER/PR positive stage I left breast cancer currently on hormone therapy with letrozole   Chief complaint/ Reason for visit- routine f/u  Heme/Onc history:  Oncology History   82 year old female with stage I (T1 C. N0 M0) invasive mammary carcinoma status post wide local excision and sentinel node biopsy tumor is ER/PR positive HER-2/neu not over expressed with a low Oncotype DX score of 17 for adjuvant whole breast radiation Oncotype DX score is 17 Patient has finished radiation therapy in December of 2015 Started letrozole from December, 2015     Breast cancer of upper-outer quadrant of left female breast (HCC)   05/26/2014 Initial Diagnosis    Breast cancer of upper-outer quadrant of left female breast (HCC), stage I, T1 cN0 M0, ER/PR positive HER-2/neu not overexpressed.      2020-02-1014 Oncotype testing    Oncotype DX score 17       - 09/05/2014 Radiation Therapy         09/05/2014 -  Anti-estrogen oral therapy    Started letrozole      Patient also has other chronic medical problems in gastric wall reveals requiring laparotomy and splenectomy in July 2016. She has chronic peg tube and needs nightly PEG feeds and takes by mouth feeds during the day. She has a history of SLE/ Sjogrens as well as history ofCMV esophagitis. She is on chronic prednisone therapy secondary to her Sjogrens.  Bone density scan in feb 2018  showed osteopenia in her L spine with a score of -2.1 and -2.2 in her forearm which were significantly decreased compared to prior scan from 2015   Interval history- she is doing well. Denies any complaints today. Her G tube is out now  ECOG PS- 0 Pain scale- 0   Review of systems- Review of Systems  Constitutional: Negative for chills, fever, malaise/fatigue and weight loss.  HENT: Negative for congestion, ear discharge and nosebleeds.   Eyes: Negative for blurred vision.  Respiratory: Negative for cough, hemoptysis, sputum production, shortness of breath and wheezing.   Cardiovascular: Negative for chest pain, palpitations, orthopnea and claudication.  Gastrointestinal: Negative for abdominal pain, blood in stool, constipation, diarrhea, heartburn, melena, nausea and vomiting.  Genitourinary: Negative for dysuria, flank pain, frequency, hematuria and urgency.  Musculoskeletal: Negative for back pain, joint pain and myalgias.  Skin: Negative for rash.  Neurological: Negative for dizziness, tingling, focal weakness, seizures, weakness and headaches.  Endo/Heme/Allergies: Does not bruise/bleed easily.  Psychiatric/Behavioral: Negative for depression and suicidal ideas. The patient does not have insomnia.        Allergies  Allergen Reactions  . Amoxicillin Other (See Comments)    Reaction:  Stomach cramps   . Cefuroxime Itching  . Contrast Media [Iodinated Diagnostic Agents] Hives  . Metrizamide Hives  . Morphine And Related     Reaction: face flushed    . Propoxyphene     Stomach cramp  . Cephalexin Rash  . Sulfa Antibiotics Rash    REACTION: Rash  .  Sulfonamide Derivatives Rash  . Tramadol Hcl Rash  . Venlafaxine Rash     Past Medical History:  Diagnosis Date  . Acute glomerulonephritis with other specified pathological lesion in kidney in disease classified elsewhere(580.81)   . Alveolar aeration decreased   . Anemia   . Arthritis   . Atrial fibrillation (HCC)     . Breast cancer (HCC) October 11, 202015   positive, radiation  . Cancer (HCC)    Breast  . CHF (congestive heart failure) (HCC)   . Diaphragmatic hernia   . DVT (deep venous thrombosis) (HCC)   . Dysrhythmia   . Environmental allergies   . Esophageal reflux   . Feeding problem    FEEDING TUBE  . Hiatal hernia   . Hyperlipidemia   . Hypertension   . Lower extremity edema   . Lupus (systemic lupus erythematosus) (HCC)   . Neuropathy   . Osteopenia   . Oxygen deficiency    USES HS  . Peripheral neuropathy, hereditary/idiopathic   . Pneumonia   . Primary pulmonary hypertension (HCC)   . Pulmonary hypertension (HCC)   . Renal insufficiency   . Sciatica   . Shingles   . Shingles (herpes zoster) polyneuropathy March 2013   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  . Shortness of breath dyspnea   . Unspecified hypothyroidism   . Unspecified menopausal and postmenopausal disorder   . Urinary incontinence without sensory awareness   . Vitamin B deficiency      Past Surgical History:  Procedure Laterality Date  . BREAST EXCISIONAL BIOPSY Left October 11, 202015   positive  . BREAST SURGERY    . CATARACT EXTRACTION W/PHACO Right 04/05/2016   Procedure: CATARACT EXTRACTION PHACO AND INTRAOCULAR LENS PLACEMENT (IOC);  Surgeon: Galen Manila, MD;  Location: [facility] ORS;  Service: Ophthalmology;  Laterality: Right;  [place] 01:20 AP% 23.9 CDE 19.29 fluid pack lot # [id number]  . CATARACT EXTRACTION W/PHACO Left 04/26/2016   Procedure: CATARACT EXTRACTION PHACO AND INTRAOCULAR LENS PLACEMENT (IOC);  Surgeon: Galen Manila, MD;  Location: [facility] ORS;  Service: Ophthalmology;  Laterality: Left;   [place] 00:59 AP% 23.2 CDE 13.83 Fluid pack lot # [id number]  . DIAGNOSTIC LAPAROSCOPY    . ESOPHAGOGASTRODUODENOSCOPY N/A 07/19/2015   Procedure: ESOPHAGOGASTRODUODENOSCOPY (EGD);  Surgeon: Wallace Cullens, MD;  Location: [facility] ENDOSCOPY;  Service: Gastroenterology;  Laterality: N/A;  . ESOPHAGOGASTRODUODENOSCOPY (EGD) WITH  PROPOFOL N/A 03/28/2015   Procedure: ESOPHAGOGASTRODUODENOSCOPY (EGD) WITH PROPOFOL;  Surgeon: Earline Mayotte, MD;  Location: [facility] ENDOSCOPY;  Service: Endoscopy;  Laterality: N/A;  . ESOPHAGOGASTRODUODENOSCOPY (EGD) WITH PROPOFOL N/A 06/10/2015   Procedure: ESOPHAGOGASTRODUODENOSCOPY (EGD) WITH PROPOFOL;  Surgeon: Christena Deem, MD;  Location: [facility] ENDOSCOPY;  Service: Endoscopy;  Laterality: N/A;  . ESOPHAGOGASTRODUODENOSCOPY (EGD) WITH PROPOFOL N/A 10/20/2015   Procedure: ESOPHAGOGASTRODUODENOSCOPY (EGD) WITH PROPOFOL;  Surgeon: Christena Deem, MD;  Location: [facility] ENDOSCOPY;  Service: Endoscopy;  Laterality: N/A;  . ESOPHAGOGASTRODUODENOSCOPY (EGD) WITH PROPOFOL N/A 02/23/2016   Procedure: ESOPHAGOGASTRODUODENOSCOPY (EGD) WITH PROPOFOL;  Surgeon: Christena Deem, MD;  Location: [facility] ENDOSCOPY;  Service: Endoscopy;  Laterality: N/A;  . ESOPHAGOGASTRODUODENOSCOPY (EGD) WITH PROPOFOL N/A 06/30/2016   Procedure: ESOPHAGOGASTRODUODENOSCOPY (EGD) WITH PROPOFOL;  Surgeon: Christena Deem, MD;  Location: [facility] ENDOSCOPY;  Service: Endoscopy;  Laterality: N/A;  . ESOPHAGOGASTRODUODENOSCOPY (EGD) WITH PROPOFOL N/A 01/05/2017   Procedure: ESOPHAGOGASTRODUODENOSCOPY (EGD) WITH PROPOFOL;  Surgeon: Christena Deem, MD;  Location: [facility] ENDOSCOPY;  Service: Endoscopy;  Laterality: N/A;  . KYPHOPLASTY N/A 02/10/2015   Procedure: [id number];  Surgeon: Kennedy Bucker,  MD;  Location: [facility] ORS;  Service: Orthopedics;  Laterality: N/A;  . LAPAROTOMY N/A 03/11/2015   Procedure: REDUCTION OF GASTRIC VOLVULUS, SPLEENECTOMY, GASTRIC TUBE PLACEMENT;  Surgeon: Earline Mayotte, MD;  Location: [facility] ORS;  Service: General;  Laterality: N/A;  . MOHS SURGERY    . PERIPHERAL VASCULAR CATHETERIZATION N/A 06/12/2015   Procedure: IVC Filter Insertion;  Surgeon: Annice Needy, MD;  Location: [facility] INVASIVE CV LAB;  Service: Cardiovascular;  Laterality: N/A;  . PERIPHERAL VASCULAR CATHETERIZATION N/A 08/23/2016   Procedure:  IVC Filter Removal;  Surgeon: Renford Dills, MD;  Location: [facility] INVASIVE CV LAB;  Service: Cardiovascular;  Laterality: N/A;  . STOMACH SURGERY     for volvolus    Social History   Social History  . Marital status: Divorced    Spouse name: N/A  . Number of children: N/A  . Years of education: N/A   Occupational History  . 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 Smoking status: Former Smoker    Quit date: 05/25/1991  . Smokeless tobacco: Never Used  . Alcohol use Yes     Comment: rare  . Drug use: No  . Sexual activity: Not on file   Other Topics Concern  . Not on file   Social History Narrative  . No narrative on file    Family History  Problem Relation Age of Onset  . Colon cancer Mother   . Alcohol abuse Sister   . Breast cancer Sister 53  . Cancer Brother   . Lung cancer Son      Current Outpatient Prescriptions:  .  acetaminophen (TYLENOL) 325 MG tablet, Take 650 mg by mouth every 4 (four) hours as needed for mild pain or fever. Reported on 02/23/2016, Disp: , Rfl:  .  ALPRAZolam (XANAX) 0.5 MG tablet, Take 0.5 mg by mouth at bedtime as needed for anxiety. Reported on 04/05/2016, Disp: , Rfl:  .  Ascorbic Acid (VITAMIN C) 1000 MG tablet, Take 1,000 mg by mouth daily., Disp: , Rfl:  .  Calcium Carbonate-Vitamin D 600-400 MG-UNIT tablet, Take 1 tablet by mouth daily., Disp: , Rfl:  .  cholecalciferol (VITAMIN D) 400 units TABS tablet, Take 400 Units by mouth., Disp: , Rfl:  .  cyanocobalamin (,VITAMIN B-12,) 1000 MCG/ML injection, Inject 1 mL (1,000 mcg total) into the muscle every 30 (thirty) days. Pt uses on the 1st of every month., Disp: 10 mL, Rfl: 1 .  famciclovir (FAMVIR) 500 MG tablet, Take 1 tablet (500 mg total) by mouth 3 (three) times daily., Disp: 21 tablet, Rfl: 1 .  hydroxychloroquine (PLAQUENIL) 200 MG tablet, Take 200 mg by mouth daily., Disp: , Rfl:  .  letrozole (FEMARA) 2.5 MG tablet, Take 1 tablet (2.5 mg  total) by mouth daily. (Patient taking differently: Take 5 mg by mouth daily. ), Disp: 90 tablet, Rfl: 3 .  levothyroxine (SYNTHROID, LEVOTHROID) 150 MCG tablet, Take 1 tablet (150 mcg total) by mouth daily., Disp: 90 tablet, Rfl: 3 .  lisinopril (PRINIVIL,ZESTRIL) 5 MG tablet, Take 1 tablet (5 mg total) by mouth daily., Disp: 90 tablet, Rfl: 3 .  loperamide (IMODIUM) 2 MG capsule, Take 2-4 mg by mouth as needed for diarrhea or loose stools. Reported on 04/05/2016, Disp: , Rfl:  .  metoCLOPramide (REGLAN) 10 MG tablet, TAKE 1 TABLET (10 MG TOTAL) BY MOUTH 4 (FOUR) TIMES DAILY - BEFORE MEALS AND AT BEDTIME., Disp: 360 tablet,  Rfl: 1 .  metoprolol succinate (TOPROL-XL) 25 MG 24 hr tablet, Take 12.5 mg by mouth daily. , Disp: , Rfl:  .  mycophenolate (CELLCEPT) 500 MG tablet, Take 1,000 mg by mouth 2 (two) times daily., Disp: , Rfl:  .  ondansetron (ZOFRAN-ODT) 4 MG disintegrating tablet, Take 4 mg by mouth every 4 (four) hours as needed for nausea or vomiting., Disp: , Rfl:  .  oxyCODONE-acetaminophen (PERCOCET/ROXICET) 5-325 MG tablet, Take 1 tablet by mouth every 8 (eight) hours as needed for severe pain., Disp: 65 tablet, Rfl: 0 .  OXYGEN, Inhale 2 mLs into the lungs at bedtime., Disp: , Rfl:  .  pantoprazole (PROTONIX) 40 MG tablet, Take 40 mg by mouth 2 (two) times daily., Disp: , Rfl:  .  Polyvinyl Alcohol-Povidone (REFRESH OP), Place 2 drops into both eyes daily as needed (dry eyes). , Disp: , Rfl:  .  predniSONE (DELTASONE) 5 MG tablet, Take 5 mg by mouth daily. , Disp: , Rfl:  .  sterile water for irrigation 237 mL, Give 2.5 mL/hr by tube continuous., Disp: , Rfl:  .  sucralfate (CARAFATE) 1 GM/10ML suspension, Take 1 g by mouth 4 (four) times daily. Before meals and nightly, Disp: , Rfl:  .  Syringe/Needle, Disp, (SYRINGE 3CC/25GX1") 25G X 1" 3 ML MISC, Use for b12 injections, Disp: 50 each, Rfl: 0 .  Tadalafil, PAH, (ADCIRCA) 20 MG TABS, Take 40 mg by mouth daily., Disp: , Rfl:  .  warfarin  (COUMADIN) 3 MG tablet, TAKE 1 TABLET BY MOUTH EVERY DAY (Patient not taking: Reported on 05/11/2017), Disp: 30 tablet, Rfl: 0 .  warfarin (COUMADIN) 4 MG tablet, TAKE 1 TABLET (4 MG TOTAL) BY MOUTH DAILY., Disp: 90 tablet, Rfl: 0 .  warfarin (COUMADIN) 5 MG tablet, TAKE 1 TABLET (5 MG TOTAL) BY MOUTH DAILY. (Patient not taking: Reported on 05/11/2017), Disp: 30 tablet, Rfl: 3  Physical exam:  Vitals:   05/26/17 1423  BP: 137/76  Pulse: 77  Resp: 18  Temp: 98.6 F (37 C)  TempSrc: Tympanic  Weight: 127 lb 8 oz (57.8 kg)   Physical Exam  Constitutional: She is oriented to person, place, and time and well-developed, well-nourished, and in no distress.  HENT:  Head: Normocephalic and atraumatic.  Eyes: Pupils are equal, round, and reactive to light. EOM are normal.  Neck: Normal range of motion.  Cardiovascular: Normal rate, regular rhythm and normal heart sounds.   Pulmonary/Chest: Effort normal and breath sounds normal.  Abdominal: Soft. Bowel sounds are normal.  Neurological: She is alert and oriented to person, place, and time.  Skin: Skin is warm and dry.   breast exam: s/p bilateral mastectomy with reconstruction. No evidence of chest wall recurrence. Implant edges are intact. No evidence of bilateral axillary adenopathy  CMP Latest Ref Rng & Units 05/17/2017  Glucose 70 - 99 mg/dL 161(W)  BUN 6 - 23 mg/dL 96(E)  Creatinine 4.54 - 1.20 mg/dL 0.98  Sodium 119 - 147 mEq/L 138  Potassium 3.5 - 5.1 mEq/L 4.2  Chloride 96 - 112 mEq/L 106  CO2 19 - 32 mEq/L 24  Calcium 8.4 - 10.5 mg/dL 9.2  Total Protein 6.0 - 8.3 g/dL 6.9  Total Bilirubin 0.2 - 1.2 mg/dL 0.3  Alkaline Phos 39 - 117 U/L 63  AST 0 - 37 U/L 16  ALT 0 - 35 U/L 7   CBC Latest Ref Rng & Units 05/26/2017  WBC 3.6 - 11.0 K/uL 8.1  Hemoglobin 12.0 -  16.0 g/dL 10.7(L)  Hematocrit 35.0 - 47.0 % 32.4(L)  Platelets 150 - 440 K/uL 333     Assessment and plan- Patient is a 82 y.o. female with following issues:  1.  Stage I left breast cancer- we will order repeat mammogram for jan 2019. Clinically doing well and no evidence of recurrence on todays exam. Continue hormone therapy. However her osteopenia is worse on letrozole. She cannot be switched to tamoxifen due to h/o recurrent DVT. She ideally needs to continue hormone therapy for 2.5 more years. She is still hesitant about using bisphosphonates due to side effect of ONJ. We will monitor bone density closely next year given the decline and continued use of letrozole  2. Macrocytic anemia - stable over last 1 year. Possible MDS. Continue to monitor.  RTC in 6 months with cbc, cmp   Visit Diagnosis 1. Malignant neoplasm of upper-outer quadrant of left female breast, unspecified estrogen receptor status (HCC)   2. Osteopenia of lumbar spine   3. Macrocytic anemia      Dr. Owens Shark, MD, MPH [facility] Pager- [phone] 05/26/2017

## 2017-05-26 NOTE — Progress Notes (Signed)
Here for follow up. Doing well she states  Per pt feels " thickening area " @ 5 oclock region mid breast region.  Sensitive to touch she stated x x 4 months.  No color changes. No d/c from breast.

## 2017-05-26 NOTE — Telephone Encounter (Signed)
Sent to be scanned.

## 2017-05-29 ENCOUNTER — Encounter: Payer: Self-pay | Admitting: Oncology

## 2017-05-30 ENCOUNTER — Other Ambulatory Visit: Payer: Self-pay | Admitting: Internal Medicine

## 2017-05-30 DIAGNOSIS — E1121 Type 2 diabetes mellitus with diabetic nephropathy: Secondary | ICD-10-CM

## 2017-05-31 DIAGNOSIS — K208 Other esophagitis: Secondary | ICD-10-CM | POA: Diagnosis not present

## 2017-05-31 DIAGNOSIS — B258 Other cytomegaloviral diseases: Secondary | ICD-10-CM | POA: Diagnosis not present

## 2017-06-01 NOTE — Progress Notes (Signed)
PEG removed 2 weeks ago, doing well, seen in clinic yesterday, some mild nausea, I will change timing on meds a bit and have her call back in 2-3 weeks.  Healed well.   Thanks,

## 2017-06-14 NOTE — Telephone Encounter (Signed)
Error

## 2017-06-21 ENCOUNTER — Ambulatory Visit (INDEPENDENT_AMBULATORY_CARE_PROVIDER_SITE_OTHER): Payer: Medicare Other | Admitting: Internal Medicine

## 2017-06-21 ENCOUNTER — Telehealth: Payer: Self-pay | Admitting: *Deleted

## 2017-06-21 ENCOUNTER — Encounter: Payer: Self-pay | Admitting: Internal Medicine

## 2017-06-21 DIAGNOSIS — L03116 Cellulitis of left lower limb: Secondary | ICD-10-CM | POA: Diagnosis not present

## 2017-06-21 MED ORDER — ONDANSETRON 4 MG PO TBDP: 4.0000 mg | ORAL_TABLET | ORAL | 5 refills | Status: DC | PRN

## 2017-06-21 MED ORDER — ONDANSETRON 4 MG PO TBDP: 4.0000 mg | ORAL_TABLET | ORAL | 6 refills | Status: DC | PRN

## 2017-06-21 MED ORDER — PROMETHAZINE HCL 12.5 MG PO TABS: 12.5000 mg | ORAL_TABLET | Freq: Three times a day (TID) | ORAL | 5 refills | Status: DC | PRN

## 2017-06-21 MED ORDER — CEPHALEXIN 500 MG PO CAPS: 500.0000 mg | ORAL_CAPSULE | Freq: Four times a day (QID) | ORAL | 0 refills | Status: DC

## 2017-06-21 NOTE — Patient Instructions (Signed)
You have a cellulitis from a possible insect bite  You can use warm compresses on it several times daily to encourage drainage and prevent abscess formation    I am prescribing generic Keflex to take 4 times  daily over the next 7 days  If the redness spreads up your thigh,  You will need to get stronger antibiotics so you should go to the ER    zofran and phenergan have been sent to [facility]

## 2017-06-21 NOTE — Progress Notes (Signed)
Subjective:  Patient ID: Diana [name], female    DOB: 05-26-1935  Age: 82 y.o. MRN: [id number]  CC: The encounter diagnosis was Cellulitis of left knee.  HPI [name] presents for  Skin infection of left knee.    Recalls a possible insect bite a few weeks ago when she felt a sting on her knee underneath her pants . Kneecap was swollen the next day,  And spent the next several days in bed nauseated and weak .  She forgot  about the knee for a week or so because apparently the swelling resolved, but last Sunday the top of the knee began looking irritated and englamed.  She has been using warm compresses and topcal  antibitoics.  Started getting hot and codl chills and nausea started yesterday and the skin has developed pockets of pus.      Outpatient Medications Prior to Visit  Medication Sig Dispense Refill  . acetaminophen (TYLENOL) 325 MG tablet Take 650 mg by mouth every 4 (four) hours as needed for mild pain or fever. Reported on 02/23/2016    . Ascorbic Acid (VITAMIN C) 1000 MG tablet Take 1,000 mg by mouth daily.    . Calcium Carbonate-Vitamin D 600-400 MG-UNIT tablet Take 1 tablet by mouth daily.    . cyanocobalamin (,VITAMIN B-12,) 1000 MCG/ML injection Inject 1 mL (1,000 mcg total) into the muscle every 30 (thirty) days. Pt uses on the 1st of every month. 10 mL 1  . hydroxychloroquine (PLAQUENIL) 200 MG tablet Take 200 mg by mouth daily.    Marland Kitchen letrozole (FEMARA) 2.5 MG tablet Take 1 tablet (2.5 mg total) by mouth daily. (Patient taking differently: Take 5 mg by mouth daily. ) 90 tablet 3  . levothyroxine (SYNTHROID, LEVOTHROID) 150 MCG tablet Take 1 tablet (150 mcg total) by mouth daily. 90 tablet 3  . lisinopril (PRINIVIL,ZESTRIL) 5 MG tablet TAKE 1 TABLET (5 MG TOTAL) BY MOUTH DAILY. 90 tablet 3  . loperamide (IMODIUM) 2 MG capsule Take 2-4 mg by mouth as needed for diarrhea or loose stools. Reported on 04/05/2016    . metoCLOPramide (REGLAN) 10 MG tablet TAKE 1 TABLET (10 MG  TOTAL) BY MOUTH 4 (FOUR) TIMES DAILY - BEFORE MEALS AND AT BEDTIME. 360 tablet 1  . metoprolol succinate (TOPROL-XL) 25 MG 24 hr tablet Take 12.5 mg by mouth daily.     . mycophenolate (CELLCEPT) 500 MG tablet Take 1,000 mg by mouth 2 (two) times daily.    Marland Kitchen oxyCODONE-acetaminophen (PERCOCET/ROXICET) 5-325 MG tablet Take 1 tablet by mouth every 8 (eight) hours as needed for severe pain. 65 tablet 0  . OXYGEN Inhale 2 mLs into the lungs at bedtime.    . pantoprazole (PROTONIX) 40 MG tablet Take 40 mg by mouth 2 (two) times daily.    . Polyvinyl Alcohol-Povidone (REFRESH OP) Place 2 drops into both eyes daily as needed (dry eyes).     . predniSONE (DELTASONE) 5 MG tablet Take 5 mg by mouth daily.     Marland Kitchen sterile water for irrigation 237 mL Give 2.5 mL/hr by tube continuous.    . sucralfate (CARAFATE) 1 GM/10ML suspension Take 1 g by mouth 4 (four) times daily. Before meals and nightly    . Syringe/Needle, Disp, (SYRINGE 3CC/25GX1") 25G X 1" 3 ML MISC Use for b12 injections 50 each 0  . Tadalafil, PAH, (ADCIRCA) 20 MG TABS Take 40 mg by mouth daily.    Marland Kitchen warfarin (COUMADIN) 4 MG tablet TAKE 1  TABLET (4 MG TOTAL) BY MOUTH DAILY. 90 tablet 0  . ondansetron (ZOFRAN-ODT) 4 MG disintegrating tablet Take 4 mg by mouth every 4 (four) hours as needed for nausea or vomiting.    Marland Kitchen ALPRAZolam (XANAX) 0.5 MG tablet Take 0.5 mg by mouth at bedtime as needed for anxiety. Reported on 04/05/2016    . warfarin (COUMADIN) 3 MG tablet TAKE 1 TABLET BY MOUTH EVERY DAY (Patient not taking: Reported on 05/11/2017) 30 tablet 0  . warfarin (COUMADIN) 5 MG tablet TAKE 1 TABLET (5 MG TOTAL) BY MOUTH DAILY. (Patient not taking: Reported on 05/11/2017) 30 tablet 3  . cholecalciferol (VITAMIN D) 400 units TABS tablet Take 400 Units by mouth.    . famciclovir (FAMVIR) 500 MG tablet Take 1 tablet (500 mg total) by mouth 3 (three) times daily. (Patient not taking: Reported on 05/26/2017) 21 tablet 1   No facility-administered medications  prior to visit.     Review of Systems;  Patient denies headache, fevers,  unintentional weight loss, skin rash, eye pain, sinus congestion and sinus pain, sore throat, dysphagia,  hemoptysis , cough, dyspnea, wheezing, chest pain, palpitations, orthopnea, edema, abdominal pain, , melena, diarrhea, constipation, flank pain, dysuria, hematuria, urinary  Frequency, nocturia, numbness, tingling, seizures,  Focal weakness, Loss of consciousness,  Tremor, insomnia, depression, anxiety, and suicidal ideation.      Objective:  BP 106/68 (BP Location: Left Arm, Patient Position: Sitting, Cuff Size: Normal)   Pulse 79   Temp 98.1 F (36.7 C) (Oral)   Resp 15   Ht 5\' 2"  (1.575 m)   Wt 127 lb 12.8 oz (58 kg)   SpO2 94%   BMI 23.37 kg/m   BP Readings from Last 3 Encounters:  06/21/17 106/68  05/26/17 137/76  05/11/17 122/80    Wt Readings from Last 3 Encounters:  06/21/17 127 lb 12.8 oz (58 kg)  05/26/17 127 lb 8 oz (57.8 kg)  05/11/17 127 lb 12.8 oz (58 kg)    General appearance: alert, cooperative and appears stated age Lungs: clear to auscultation bilaterally Heart: regular rate and rhythm, S1, S2 normal, no murmur, click, rub or gallop Abdomen: soft, non-tender; bowel sounds normal; no masses,  no organomegaly Pulses: 2+ and symmetric Skin: left kneecap with erythema and swelling,  Superficial collection of Pus .  No abscess Lymph nodes: Cervical, supraclavicular, and axillary nodes normal.  Lab Results  Component Value Date   HGBA1C 6.3 05/17/2017   HGBA1C 5.8 09/14/2016   HGBA1C 6.2 05/09/2016    Lab Results  Component Value Date   CREATININE 1.26 (H) 05/26/2017   CREATININE 1.00 05/17/2017   CREATININE 1.21 (H) 01/18/2017    Lab Results  Component Value Date   WBC 8.1 05/26/2017   HGB 10.7 (L) 05/26/2017   HCT 32.4 (L) 05/26/2017   PLT 333 05/26/2017   GLUCOSE 122 (H) 05/26/2017   CHOL 164 10/11/2013   TRIG 152.0 (H) 10/11/2013   HDL 69.70 10/11/2013    LDLDIRECT 50.0 05/09/2016   LDLCALC 64 10/11/2013   ALT 10 (L) 05/26/2017   AST 22 05/26/2017   NA 140 05/26/2017   K 4.1 05/26/2017   CL 107 05/26/2017   CREATININE 1.26 (H) 05/26/2017   BUN 22 (H) 05/26/2017   CO2 23 05/26/2017   TSH 0.12 (L) 05/17/2017   INR 2.9 (H) 05/17/2017   HGBA1C 6.3 05/17/2017   MICROALBUR 8.7 (H) 09/14/2016    No results found.  Assessment & Plan:   Problem List  Items Addressed This Visit    Cellulitis of left knee    Keflex    5000 mg qid x 7 days,  Warm compresses.  Advised to onitor for spread of redness to thigh         I have discontinued Ms. Eastep's cholecalciferol, famciclovir, ondansetron, and ondansetron. I have also changed her ondansetron. Additionally, I am having her start on cephALEXin and promethazine. Lastly, I am having her maintain her hydroxychloroquine, tadalafil (PAH), acetaminophen, loperamide, predniSONE, metoprolol succinate, mycophenolate, Polyvinyl Alcohol-Povidone (REFRESH OP), OXYGEN, ALPRAZolam, sterile water for irrigation 237 mL, letrozole, Calcium Carbonate-Vitamin D, pantoprazole, cyanocobalamin, SYRINGE 3CC/25GX1", vitamin C, sucralfate, warfarin, warfarin, warfarin, oxyCODONE-acetaminophen, levothyroxine, metoCLOPramide, and lisinopril.  Meds ordered this encounter  Medications  . cephALEXin (KEFLEX) 500 MG capsule    Sig: Take 1 capsule (500 mg total) by mouth 4 (four) times daily.    Dispense:  28 capsule    Refill:  0    PATIENT IS NOT ALLERGIC TO CEPHALEXIN ,   I HAVE CHECKED AGAIN!  NO RASH WITH WITH MED  . DISCONTD: ondansetron (ZOFRAN-ODT) 4 MG disintegrating tablet    Sig: Take 1 tablet (4 mg total) by mouth every 4 (four) hours as needed for nausea or vomiting.    Dispense:  60 tablet    Refill:  6  . ondansetron (ZOFRAN-ODT) 4 MG disintegrating tablet    Sig: Take 1 tablet (4 mg total) by mouth every 4 (four) hours as needed for nausea or vomiting.    Dispense:  60 tablet    Refill:  5  .  promethazine (PHENERGAN) 12.5 MG tablet    Sig: Take 1 tablet (12.5 mg total) by mouth every 8 (eight) hours as needed for nausea or vomiting.    Dispense:  20 tablet    Refill:  5  . DISCONTD: ondansetron (ZOFRAN-ODT) 4 MG disintegrating tablet    Sig: Take 1 tablet (4 mg total) by mouth every 4 (four) hours as needed for nausea or vomiting.    Dispense:  60 tablet    Refill:  6    Medications Discontinued During This Encounter  Medication Reason  . cholecalciferol (VITAMIN D) 400 units TABS tablet Patient has not taken in last 30 days  . famciclovir (FAMVIR) 500 MG tablet Patient has not taken in last 30 days  . ondansetron (ZOFRAN-ODT) 4 MG disintegrating tablet Reorder  . ondansetron (ZOFRAN-ODT) 4 MG disintegrating tablet   . ondansetron (ZOFRAN-ODT) 4 MG disintegrating tablet Duplicate    Follow-up: No Follow-up on file.   Sherlene Shams, MD

## 2017-06-21 NOTE — Telephone Encounter (Signed)
Scheduled

## 2017-06-21 NOTE — Telephone Encounter (Signed)
Pt stated that she was bit several weeks ago. The area now has small individual puss bumps around the site. Pt has concerns that her upset stomach could be coming from the area. The site is the size of a quarter  Please advise Pt contact [phone]

## 2017-06-21 NOTE — Telephone Encounter (Signed)
Needs to be seen today.  If no available appts you can overbook me at 4:30 for evaluation of skin issue ONLY

## 2017-06-21 NOTE — Telephone Encounter (Signed)
Patient aware of appointment date/time. Please double book for 4:30 today per Dr. Derrel Nip for bite to L knee.

## 2017-06-21 NOTE — Telephone Encounter (Signed)
Patient was bit by something a few weeks ago on her left knee. She says it hurt but she didn't see what it was. She said it was swollen the day after and then gradually got better with no pain. Now the site is the size of a quarter with little bumps with puss in them around the site. Patient says the whole knee is red around it but no redness going up or down the leg. She noticed this yesterday. Patient is feeling nauseous today and says she feels warm but does not think she has a fever. She says she just does not feel well. She denies any other symptoms. Please advise.

## 2017-06-22 ENCOUNTER — Ambulatory Visit: Payer: Medicare Other | Admitting: Internal Medicine

## 2017-06-22 DIAGNOSIS — L03116 Cellulitis of left lower limb: Secondary | ICD-10-CM | POA: Insufficient documentation

## 2017-06-22 NOTE — Assessment & Plan Note (Addendum)
Keflex    5000 mg qid x 7 days,  Warm compresses.  Advised to onitor for spread of redness to thigh

## 2017-06-30 ENCOUNTER — Ambulatory Visit (INDEPENDENT_AMBULATORY_CARE_PROVIDER_SITE_OTHER): Payer: Medicare Other

## 2017-06-30 ENCOUNTER — Other Ambulatory Visit: Payer: Medicare Other

## 2017-06-30 ENCOUNTER — Telehealth: Payer: Self-pay | Admitting: Radiology

## 2017-06-30 DIAGNOSIS — Z7901 Long term (current) use of anticoagulants: Secondary | ICD-10-CM | POA: Diagnosis not present

## 2017-06-30 DIAGNOSIS — Z23 Encounter for immunization: Secondary | ICD-10-CM | POA: Diagnosis not present

## 2017-06-30 DIAGNOSIS — E039 Hypothyroidism, unspecified: Secondary | ICD-10-CM

## 2017-06-30 NOTE — Telephone Encounter (Signed)
Since it is almost 5pm, I will just sent the TSH & add a PT/INR. The other labs were ordered by another physician to have drawn in March (at 6 mth f/u).

## 2017-06-30 NOTE — Telephone Encounter (Signed)
Pt came in for labs today and had future orders for just TSH. Pt was drawn for TSH, INR and CBC if needed. Would you like INR and CBC or just TSH?

## 2017-06-30 NOTE — Telephone Encounter (Signed)
Lab Results  Component Value Date   TSH 0.12 (L) 05/17/2017   Thanks . tsh is all I needed!

## 2017-06-30 NOTE — Addendum Note (Signed)
Addended by: Leeanne Rio on: 06/30/2017 04:49 PM   Modules accepted: Orders

## 2017-07-01 LAB — TSH: TSH: 0.67 mIU/L (ref 0.40–4.50)

## 2017-07-01 LAB — PROTIME-INR
INR: 3.1 — ABNORMAL HIGH
Prothrombin Time: 32.2 s — ABNORMAL HIGH (ref 9.0–11.5)

## 2017-07-14 ENCOUNTER — Other Ambulatory Visit: Payer: Self-pay | Admitting: Internal Medicine

## 2017-07-17 MED ORDER — METOPROLOL SUCCINATE ER 25 MG TABLET,EXTENDED RELEASE 24 HR
ORAL_TABLET | 1 refills | 0 days | Status: CP
Start: 2017-07-17 — End: 2017-07-28

## 2017-07-28 ENCOUNTER — Ambulatory Visit
Admission: RE | Admit: 2017-07-28 | Discharge: 2017-07-28 | Disposition: A | Discharge status: Home / Self Care | Payer: MEDICARE | Attending: Internal Medicine | Admitting: Internal Medicine

## 2017-07-28 DIAGNOSIS — R0681 Apnea, not elsewhere classified: Secondary | ICD-10-CM

## 2017-07-28 DIAGNOSIS — I272 Pulmonary hypertension, unspecified: Secondary | ICD-10-CM

## 2017-07-28 DIAGNOSIS — M328 Other forms of systemic lupus erythematosus: Secondary | ICD-10-CM

## 2017-07-28 DIAGNOSIS — K219 Gastro-esophageal reflux disease without esophagitis: Secondary | ICD-10-CM

## 2017-07-28 DIAGNOSIS — I5030 Unspecified diastolic (congestive) heart failure: Principal | ICD-10-CM

## 2017-07-28 MED ORDER — METOPROLOL SUCCINATE ER 25 MG TABLET,EXTENDED RELEASE 24 HR
ORAL_TABLET | 3 refills | 0 days | Status: CP
Start: 2017-07-28 — End: 2018-09-11

## 2017-07-31 ENCOUNTER — Other Ambulatory Visit: Payer: Self-pay | Admitting: Radiology

## 2017-07-31 DIAGNOSIS — Z7901 Long term (current) use of anticoagulants: Principal | ICD-10-CM

## 2017-07-31 DIAGNOSIS — Z5181 Encounter for therapeutic drug level monitoring: Secondary | ICD-10-CM

## 2017-08-02 ENCOUNTER — Other Ambulatory Visit (INDEPENDENT_AMBULATORY_CARE_PROVIDER_SITE_OTHER): Payer: Medicare Other

## 2017-08-02 DIAGNOSIS — Z5181 Encounter for therapeutic drug level monitoring: Secondary | ICD-10-CM

## 2017-08-02 DIAGNOSIS — Z7901 Long term (current) use of anticoagulants: Secondary | ICD-10-CM | POA: Diagnosis not present

## 2017-08-03 LAB — PROTIME-INR

## 2017-08-07 ENCOUNTER — Ambulatory Visit: Payer: Medicare Other

## 2017-08-08 ENCOUNTER — Encounter: Payer: Self-pay | Admitting: Internal Medicine

## 2017-08-08 ENCOUNTER — Ambulatory Visit (INDEPENDENT_AMBULATORY_CARE_PROVIDER_SITE_OTHER): Payer: Medicare Other

## 2017-08-08 ENCOUNTER — Ambulatory Visit (INDEPENDENT_AMBULATORY_CARE_PROVIDER_SITE_OTHER): Payer: Medicare Other | Admitting: Internal Medicine

## 2017-08-08 VITALS — BP 118/70 | HR 85 | Temp 97.9°F | Resp 16 | Ht 62.0 in | Wt 128.0 lb

## 2017-08-08 DIAGNOSIS — E119 Type 2 diabetes mellitus without complications: Secondary | ICD-10-CM

## 2017-08-08 DIAGNOSIS — N182 Chronic kidney disease, stage 2 (mild): Secondary | ICD-10-CM

## 2017-08-08 DIAGNOSIS — Z0000 Encounter for general adult medical examination without abnormal findings: Secondary | ICD-10-CM | POA: Diagnosis not present

## 2017-08-08 DIAGNOSIS — S51811A Laceration without foreign body of right forearm, initial encounter: Secondary | ICD-10-CM

## 2017-08-08 DIAGNOSIS — I10 Essential (primary) hypertension: Secondary | ICD-10-CM | POA: Diagnosis not present

## 2017-08-08 DIAGNOSIS — Z5181 Encounter for therapeutic drug level monitoring: Secondary | ICD-10-CM

## 2017-08-08 DIAGNOSIS — M47817 Spondylosis without myelopathy or radiculopathy, lumbosacral region: Secondary | ICD-10-CM

## 2017-08-08 DIAGNOSIS — M4697 Unspecified inflammatory spondylopathy, lumbosacral region: Secondary | ICD-10-CM | POA: Diagnosis not present

## 2017-08-08 DIAGNOSIS — B029 Zoster without complications: Secondary | ICD-10-CM

## 2017-08-08 DIAGNOSIS — Z7901 Long term (current) use of anticoagulants: Secondary | ICD-10-CM | POA: Diagnosis not present

## 2017-08-08 LAB — BASIC METABOLIC PANEL
BUN: 24 mg/dL — ABNORMAL HIGH (ref 6–23)
CALCIUM: 9.3 mg/dL (ref 8.4–10.5)
CO2: 24 meq/L (ref 19–32)
CREATININE: 1.28 mg/dL — AB (ref 0.40–1.20)
Chloride: 109 mEq/L (ref 96–112)
GFR: 42.58 mL/min — AB (ref >60.00)
GLUCOSE: 95 mg/dL (ref 70–99)
Potassium: 4.2 mEq/L (ref 3.5–5.1)
SODIUM: 141 meq/L (ref 135–145)

## 2017-08-08 LAB — PROTIME-INR
INR: 3.4 ratio — ABNORMAL HIGH (ref 0.8–1.0)
PROTHROMBIN TIME: 36.7 s — AB (ref 9.6–13.1)

## 2017-08-08 MED ORDER — OXYCODONE-ACETAMINOPHEN 5-325 MG PO TABS: 1.0000 | ORAL_TABLET | Freq: Three times a day (TID) | ORAL | 0 refills | Status: DC | PRN

## 2017-08-08 MED ORDER — ACYCLOVIR 800 MG PO TABS: 800.0000 mg | ORAL_TABLET | Freq: Every day | ORAL | 1 refills | Status: AC

## 2017-08-08 NOTE — Progress Notes (Signed)
Subjective:   Diana [name] is a 82 y.o. female who presents for Medicare Annual (Subsequent) preventive examination.  Review of Systems:  No ROS.  Medicare Wellness Visit. Additional risk factors are reflected in the social history. Cardiac Risk Factors include: hypertension;advanced age (>35men, >10 women);diabetes mellitus     Objective:     Vitals: BP 118/70 (BP Location: Right Arm, Patient Position: Sitting, Cuff Size: Normal)   Pulse 85   Temp 97.9 F (36.6 C) (Oral)   Resp 16   Ht 5\' 2"  (1.575 m)   Wt 128 lb (58.1 kg)   SpO2 97%   BMI 23.41 kg/m   Body mass index is 23.41 kg/m.   Tobacco Social History   Tobacco Use  Smoking Status Former Smoker  . Last attempt to quit: 05/25/1991  . Years since quitting: 26.2  Smokeless Tobacco Never Used     Counseling given: Not Answered   Past Medical History:  Diagnosis Date  . Acute glomerulonephritis with other specified pathological lesion in kidney in disease classified elsewhere(580.81)   . Alveolar aeration decreased   . Anemia   . Arthritis   . Atrial fibrillation ([place])   . Breast cancer ([place]) 06/03/2014   positive, radiation  . Cancer (HCC)    Breast  . CHF (congestive heart failure) ([place])   . Diaphragmatic hernia   . DVT (deep venous thrombosis) ([place])   . Dysrhythmia   . Environmental allergies   . Esophageal reflux   . Feeding problem    FEEDING TUBE  . Hiatal hernia   . Hyperlipidemia   . Hypertension   . Lower extremity edema   . Lupus (systemic lupus erythematosus) ([place])   . Neuropathy   . Osteopenia   . Oxygen deficiency    USES HS  . Peripheral neuropathy, hereditary/idiopathic   . Pneumonia   . Primary pulmonary hypertension ([place])   . Pulmonary hypertension ([place])   . Renal insufficiency   . Sciatica   . Shingles   . Shingles (herpes zoster) polyneuropathy March 2013   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  . Shortness of breath dyspnea   . Unspecified hypothyroidism   .  Unspecified menopausal and postmenopausal disorder   . Urinary incontinence without sensory awareness   . Vitamin B deficiency    Past Surgical History:  Procedure Laterality Date  . BREAST EXCISIONAL BIOPSY Left 06/03/2014   positive  . BREAST SURGERY    . CATARACT EXTRACTION W/PHACO Right 04/05/2016   Procedure: CATARACT EXTRACTION PHACO AND INTRAOCULAR LENS PLACEMENT (IOC);  Surgeon: Birder Robson, MD;  Location: [facility] ORS;  Service: Ophthalmology;  Laterality: Right;  [place] 01:20 AP% 23.9 CDE 19.29 fluid pack lot # [id number]  . CATARACT EXTRACTION W/PHACO Left 04/26/2016   Procedure: CATARACT EXTRACTION PHACO AND INTRAOCULAR LENS PLACEMENT ([place]);  Surgeon: Birder Robson, MD;  Location: [facility] ORS;  Service: Ophthalmology;  Laterality: Left;   [place] 00:59 AP% 23.2 CDE 13.83 Fluid pack lot # [id number]  . DIAGNOSTIC LAPAROSCOPY    . ESOPHAGOGASTRODUODENOSCOPY N/A 07/19/2015   Procedure: ESOPHAGOGASTRODUODENOSCOPY (EGD);  Surgeon: Hulen Luster, MD;  Location: [facility] ENDOSCOPY;  Service: Gastroenterology;  Laterality: N/A;  . ESOPHAGOGASTRODUODENOSCOPY (EGD) WITH PROPOFOL N/A 03/28/2015   Procedure: ESOPHAGOGASTRODUODENOSCOPY (EGD) WITH PROPOFOL;  Surgeon: Robert Bellow, MD;  Location: [facility] ENDOSCOPY;  Service: Endoscopy;  Laterality: N/A;  . ESOPHAGOGASTRODUODENOSCOPY (EGD) WITH PROPOFOL N/A 06/10/2015   Procedure: ESOPHAGOGASTRODUODENOSCOPY (EGD) WITH PROPOFOL;  Surgeon: Lollie Sails, MD;  Location: [facility] ENDOSCOPY;  Service:  Endoscopy;  Laterality: N/A;  . ESOPHAGOGASTRODUODENOSCOPY (EGD) WITH PROPOFOL N/A 10/20/2015   Procedure: ESOPHAGOGASTRODUODENOSCOPY (EGD) WITH PROPOFOL;  Surgeon: Lollie Sails, MD;  Location: [facility] ENDOSCOPY;  Service: Endoscopy;  Laterality: N/A;  . ESOPHAGOGASTRODUODENOSCOPY (EGD) WITH PROPOFOL N/A 02/23/2016   Procedure: ESOPHAGOGASTRODUODENOSCOPY (EGD) WITH PROPOFOL;  Surgeon: Lollie Sails, MD;  Location: [facility] ENDOSCOPY;  Service: Endoscopy;  Laterality: N/A;    . ESOPHAGOGASTRODUODENOSCOPY (EGD) WITH PROPOFOL N/A 06/30/2016   Procedure: ESOPHAGOGASTRODUODENOSCOPY (EGD) WITH PROPOFOL;  Surgeon: Lollie Sails, MD;  Location: [facility] ENDOSCOPY;  Service: Endoscopy;  Laterality: N/A;  . ESOPHAGOGASTRODUODENOSCOPY (EGD) WITH PROPOFOL N/A 01/05/2017   Procedure: ESOPHAGOGASTRODUODENOSCOPY (EGD) WITH PROPOFOL;  Surgeon: Lollie Sails, MD;  Location: [facility] ENDOSCOPY;  Service: Endoscopy;  Laterality: N/A;  . KYPHOPLASTY N/A 02/10/2015   Procedure: OVFIEPPIRJJ/O8;  Surgeon: Hessie Knows, MD;  Location: [facility] ORS;  Service: Orthopedics;  Laterality: N/A;  . LAPAROTOMY N/A 03/11/2015   Procedure: REDUCTION OF GASTRIC VOLVULUS, SPLEENECTOMY, GASTRIC TUBE PLACEMENT;  Surgeon: Robert Bellow, MD;  Location: [facility] ORS;  Service: General;  Laterality: N/A;  . MOHS SURGERY    . PERIPHERAL VASCULAR CATHETERIZATION N/A 06/12/2015   Procedure: IVC Filter Insertion;  Surgeon: Algernon Huxley, MD;  Location: [place] CV LAB;  Service: Cardiovascular;  Laterality: N/A;  . PERIPHERAL VASCULAR CATHETERIZATION N/A 08/23/2016   Procedure: IVC Filter Removal;  Surgeon: Katha Cabal, MD;  Location: [place] CV LAB;  Service: Cardiovascular;  Laterality: N/A;  . STOMACH SURGERY     for volvolus   Family History  Problem Relation Age of Onset  . Colon cancer Mother   . Alcohol abuse Sister   . Breast cancer Sister 65  . Cancer Brother   . Lung cancer Son    Social History   Substance and Sexual Activity  Sexual Activity Not on file    Outpatient Encounter Medications as of 08/08/2017  Medication Sig  . acetaminophen (TYLENOL) 325 MG tablet Take 650 mg by mouth every 4 (four) hours as needed for mild pain or fever. Reported on 02/23/2016  . Ascorbic Acid (VITAMIN C) 1000 MG tablet Take 1,000 mg by mouth daily.  . Calcium Carbonate-Vitamin D 600-400 MG-UNIT tablet Take 1 tablet by mouth daily.  . cyanocobalamin (,VITAMIN B-12,) 1000 MCG/ML injection Inject 1  mL (1,000 mcg total) into the muscle every 30 (thirty) days. Pt uses on the 1st of every month.  . hydroxychloroquine (PLAQUENIL) 200 MG tablet Take 200 mg by mouth daily.  Marland Kitchen letrozole (FEMARA) 2.5 MG tablet Take 1 tablet (2.5 mg total) by mouth daily. (Patient taking differently: Take 5 mg by mouth daily. )  . levothyroxine (SYNTHROID, LEVOTHROID) 150 MCG tablet Take 1 tablet (150 mcg total) by mouth daily.  Marland Kitchen lisinopril (PRINIVIL,ZESTRIL) 5 MG tablet TAKE 1 TABLET (5 MG TOTAL) BY MOUTH DAILY.  Marland Kitchen metoCLOPramide (REGLAN) 10 MG tablet TAKE 1 TABLET (10 MG TOTAL) BY MOUTH 4 (FOUR) TIMES DAILY - BEFORE MEALS AND AT BEDTIME.  . metoprolol succinate (TOPROL-XL) 25 MG 24 hr tablet Take 12.5 mg by mouth daily.   . mycophenolate (CELLCEPT) 500 MG tablet Take 1,000 mg by mouth 2 (two) times daily.  . ondansetron (ZOFRAN-ODT) 4 MG disintegrating tablet Take 1 tablet (4 mg total) by mouth every 4 (four) hours as needed for nausea or vomiting.  . OXYGEN Inhale 2 mLs into the lungs at bedtime.  . pantoprazole (PROTONIX) 40 MG tablet Take 40 mg by mouth 2 (two) times daily.  Marland Kitchen  Polyvinyl Alcohol-Povidone (REFRESH OP) Place 2 drops into both eyes daily as needed (dry eyes).   . predniSONE (DELTASONE) 5 MG tablet Take 5 mg by mouth daily.   . promethazine (PHENERGAN) 12.5 MG tablet Take 1 tablet (12.5 mg total) by mouth every 8 (eight) hours as needed for nausea or vomiting.  . sterile water for irrigation 237 mL Give 2.5 mL/hr by tube continuous.  . sucralfate (CARAFATE) 1 GM/10ML suspension Take 1 g by mouth 4 (four) times daily. Before meals and nightly  . Syringe/Needle, Disp, (SYRINGE 3CC/25GX1") 25G X 1" 3 ML MISC Use for b12 injections  . Tadalafil, PAH, (ADCIRCA) 20 MG TABS Take 40 mg by mouth daily.  Marland Kitchen warfarin (COUMADIN) 3 MG tablet TAKE 1 TABLET BY MOUTH EVERY DAY  . warfarin (COUMADIN) 4 MG tablet TAKE 1 TABLET (4 MG TOTAL) BY MOUTH DAILY.  Marland Kitchen warfarin (COUMADIN) 5 MG tablet TAKE 1 TABLET (5 MG TOTAL)  BY MOUTH DAILY.   No facility-administered encounter medications on file as of 08/08/2017.     Activities of Daily Living In your present state of health, do you have any difficulty performing the following activities: 08/08/2017 08/23/2016  Hearing? N N  Vision? N N  Difficulty concentrating or making decisions? N N  Walking or climbing stairs? Y N  Comment Unsteady gait -  Dressing or bathing? N N  Doing errands, shopping? Y -  Comment She does not drive Facilities manager and eating ? N -  Using the Toilet? N -  In the past six months, have you accidently leaked urine? N -  Do you have problems with loss of bowel control? N -  Managing your Medications? N -  Managing your Finances? N -  Housekeeping or managing your Housekeeping? Y -  Comment Housekeeper assists -  Some recent data might be hidden    Patient Care Team: Crecencio Mc, MD as PCP - General (Internal Medicine) Bary Castilla, Forest Gleason, MD (General Surgery) Gregor Hams, MD as Attending Physician (Emergency Medicine) Pleasant, Eppie Gibson, RN as [place] Management    Assessment:    This is a routine wellness examination for Diana [name]. The goal of the wellness visit is to assist the patient how to close the gaps in care and create a preventative care plan for the patient.   The roster of all physicians providing medical care to patient is listed in the Snapshot section of the chart.  Taking calcium VIT D as appropriate/Osteoporosis risk reviewed.    Safety issues reviewed; Smoke and carbon monoxide detectors in the home. No firearms in the home.  Wears seatbelts when riding with others. Patient does wear sunscreen or protective clothing when in direct sunlight. No violence in the home.  Depression- PHQ 2 &9 complete.  No signs/symptoms or verbal communication regarding little pleasure in doing things, feeling down, depressed or hopeless. No changes in sleeping, energy, eating,  concentrating.  No thoughts of self harm or harm towards others.  Time spent on this topic is 8 minutes.   Patient is alert, normal appearance, oriented to person/place/and time. Correctly identified the president of the [place], recall of 3/3 words, and performing simple calculations. Displays appropriate judgement and can read correct time from watch face.   No new identified risk were noted.  No failures at ADL's or IADL's.    BMI- discussed the importance of a healthy diet, water intake and the benefits of aerobic exercise. Educational material provided.  Dental- every 12 months.  Eye- Visual acuity not assessed per patient preference since they have regular follow up with the ophthalmologist.  Wears corrective lenses.  Sleep patterns- Sleeps 8 hours at night.  Wakes feeling rested.  Oxygen in use at 2L at night.  Patient Concerns: None at this time. Follow up with PCP as needed.  Exercise Activities and Dietary recommendations Current Exercise Habits: The patient does not participate in regular exercise at present  Goals    . Increase physical activity     Range of motion-chair, bed exercises as tolerated. Standing exercises while holding onto the sink/table.      Fall Risk Fall Risk  08/08/2017 08/08/2017 08/05/2016 05/25/2016 05/12/2016  Falls in the past year? No No No No No  Comment - - - - -  Number falls in past yr: - - - - -  Injury with Fall? - - - - -  Comment - - - - -  Risk for fall due to : - - - - Impaired balance/gait;Impaired mobility  Risk for fall due to: Comment - - - - -   Depression Screen PHQ 2/9 Scores 08/08/2017 08/08/2017 08/05/2016 05/25/2016  PHQ - 2 Score 0 0 0 0  PHQ- 9 Score 0 - - -  Exception Documentation - - - -     Cognitive Function     6CIT Screen 08/08/2017 08/05/2016  What Year? 0 points 0 points  What month? 0 points 0 points  What time? 0 points 0 points  Count back from 20 0 points 0 points  Months in reverse 0 points 0 points   Repeat phrase 0 points -  Total Score 0 -    Immunization History  Administered Date(s) Administered  . Influenza Split 07/04/2011, 06/29/2012, 07/03/2014  . Influenza, High Dose Seasonal PF 10/16/2013, 06/15/2016, 06/30/2017  . Influenza,inj,Quad PF,6+ Mos 08/17/2015  . Pneumococcal Conjugate-13 10/16/2013  . Pneumococcal Polysaccharide-23 07/16/2012  . Tdap 10/16/2009   Screening Tests Health Maintenance  Topic Date Due  . FOOT EXAM  05/03/2017  . HEMOGLOBIN A1C  11/16/2017  . OPHTHALMOLOGY EXAM  11/24/2017  . TETANUS/TDAP  10/17/2019  . INFLUENZA VACCINE  Completed  . DEXA SCAN  Completed  . PNA vac Low Risk Adult  Completed      Plan:    End of life planning; Advance aging; Advanced directives discussed. Copy of current HCPOA/Living Will requested.    I have personally reviewed and noted the following in the patient's chart:   . Medical and social history . Use of alcohol, tobacco or illicit drugs  . Current medications and supplements . Functional ability and status . Nutritional status . Physical activity . Advanced directives . List of other physicians . Hospitalizations, surgeries, and ER visits in previous 12 months . Vitals . Screenings to include cognitive, depression, and falls . Referrals and appointments  In addition, I have reviewed and discussed with patient certain preventive protocols, quality metrics, and best practice recommendations. A written personalized care plan for preventive services as well as general preventive health recommendations were provided to patient.     OBrien-Blaney, Yakelin Grenier L, LPN  [id number]   I have reviewed the above information and agree with above.   Deborra Medina, MD

## 2017-08-08 NOTE — Patient Instructions (Signed)
You are doing well!  I have refill your Percocet for Nov, Dec and January  Acyclovir has been refilled as well   No medication changes today

## 2017-08-08 NOTE — Patient Instructions (Addendum)
  Diana [name] , Thank you for taking time to come for your Medicare Wellness Visit. I appreciate your ongoing commitment to your health goals. Please review the following plan we discussed and let me know if I can assist you in the future.   These are the goals we discussed: Goals    . Increase physical activity     Range of motion-chair, bed exercises as tolerated. Standing exercises while holding onto the sink/table.       This is a list of the screening recommended for you and due dates:  Health Maintenance  Topic Date Due  . Complete foot exam   05/03/2017  . Hemoglobin A1C  11/16/2017  . Eye exam for diabetics  11/24/2017  . Tetanus Vaccine  10/17/2019  . Flu Shot  Completed  . DEXA scan (bone density measurement)  Completed  . Pneumonia vaccines  Completed
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
## 2017-08-09 ENCOUNTER — Other Ambulatory Visit: Payer: Self-pay | Admitting: Internal Medicine

## 2017-08-09 MED ORDER — WARFARIN SODIUM 3 MG PO TABS: 3.0000 mg | ORAL_TABLET | Freq: Every day | ORAL | 2 refills | Status: DC

## 2017-08-10 DIAGNOSIS — S51811A Laceration without foreign body of right forearm, initial encounter: Secondary | ICD-10-CM | POA: Insufficient documentation

## 2017-08-10 NOTE — Assessment & Plan Note (Signed)
Well controlled on current regimen. Renal function stable, no changes today.  Lab Results  Component Value Date   CREATININE 1.28 (H) 08/08/2017   Lab Results  Component Value Date   NA 141 08/08/2017   K 4.2 08/08/2017   CL 109 08/08/2017   CO2 24 08/08/2017

## 2017-08-10 NOTE — Assessment & Plan Note (Signed)
  Wound examined,  Healing well ,  Devitalized skin removed with scissors and forceps.  No bleedings, no sign of infection

## 2017-08-10 NOTE — Assessment & Plan Note (Signed)
Her pain is persistent despite ESI and nerve blocks and has been complicated by vertebral fracture,  It is controlled with  Percocet,  2 daily.  She has not had any ER visits  And has not requested any early refills.  Her Refill history was confirmed via [place] Controlled Substance database by me today during her visit and there have been no prescriptions of controlled substances filled from any providers other than me. . Refills for #60 tablets  Given

## 2017-08-10 NOTE — Assessment & Plan Note (Signed)
Managed with diet .  Foot exam is normal. She is taking and ace inhibitor.  Diana [name] is c/i due to use of warfarin,   Lab Results  Component Value Date   HGBA1C 6.3 05/17/2017

## 2017-08-15 ENCOUNTER — Telehealth: Payer: Self-pay

## 2017-08-15 NOTE — Telephone Encounter (Signed)
No aches and Afebrile ,

## 2017-08-15 NOTE — Telephone Encounter (Signed)
Copied from [place]. Topic: Inquiry >> Aug 15, 2017 10:32 AM Corie Chiquito, [place] wrote: Reason for NBV:[facility] called and would if Juliann Pulse give her a call back asap at [phone]

## 2017-08-15 NOTE — Telephone Encounter (Signed)
Patient she is really weak an unable to walk, coughing and having yellow mucus has no way into office, stated she cannot go to UC or ER.  Advised patient she really needs to be evaluated patient said just cannot come into office she  Is afebrile, denies SOB just very weak she said.

## 2017-08-15 NOTE — Telephone Encounter (Signed)
Patient stated she understood and would wait and see how she feels tomorrow if no better she will have son home to [facility] and he can take her to the walk in clinic. FYI

## 2017-08-15 NOTE — Telephone Encounter (Signed)
Fevers,  Body aches?

## 2017-08-15 NOTE — Telephone Encounter (Signed)
I'm not sure what I can do for her ,  Without fevers and shortness of breath I do not recommend use of antibiotics, because it sounds like she has a viral infection .  Marland Kitchen Is that what she is asking for ?

## 2017-08-16 DIAGNOSIS — J019 Acute sinusitis, unspecified: Secondary | ICD-10-CM | POA: Diagnosis not present

## 2017-08-22 ENCOUNTER — Other Ambulatory Visit: Payer: Self-pay | Admitting: Radiology

## 2017-08-22 DIAGNOSIS — Z7901 Long term (current) use of anticoagulants: Principal | ICD-10-CM

## 2017-08-22 DIAGNOSIS — Z5181 Encounter for therapeutic drug level monitoring: Secondary | ICD-10-CM

## 2017-08-23 ENCOUNTER — Other Ambulatory Visit (INDEPENDENT_AMBULATORY_CARE_PROVIDER_SITE_OTHER): Payer: Medicare Other

## 2017-08-23 DIAGNOSIS — Z7901 Long term (current) use of anticoagulants: Secondary | ICD-10-CM

## 2017-08-23 DIAGNOSIS — Z5181 Encounter for therapeutic drug level monitoring: Secondary | ICD-10-CM

## 2017-08-23 LAB — PROTIME-INR
INR: 4.1 ratio — ABNORMAL HIGH (ref 0.8–1.0)
Prothrombin Time: 43.7 s — ABNORMAL HIGH (ref 9.6–13.1)

## 2017-08-23 MED ORDER — WARFARIN SODIUM 3 MG PO TABS: 3.0000 mg | ORAL_TABLET | Freq: Every day | ORAL | 2 refills | Status: DC

## 2017-08-23 NOTE — Assessment & Plan Note (Addendum)
INR 4.1 on regimen of 3 mg alternating with 4 mg.  Dose reduced to 3 mg daily

## 2017-08-23 NOTE — Addendum Note (Signed)
Addended by: Crecencio Mc on: 08/23/2017 09:41 PM   Modules accepted: Orders

## 2017-08-30 ENCOUNTER — Other Ambulatory Visit
Admission: RE | Admit: 2017-08-30 | Discharge: 2017-08-30 | Disposition: A | Discharge status: Home / Self Care | Payer: Medicare Other | Source: Ambulatory Visit | Attending: Internal Medicine | Admitting: Internal Medicine

## 2017-08-30 ENCOUNTER — Other Ambulatory Visit: Payer: Medicare Other

## 2017-08-30 ENCOUNTER — Telehealth: Payer: Self-pay | Admitting: *Deleted

## 2017-08-30 DIAGNOSIS — Z7901 Long term (current) use of anticoagulants: Secondary | ICD-10-CM | POA: Insufficient documentation

## 2017-08-30 LAB — PROTIME-INR
INR: 1.44
Prothrombin Time: 17.4 seconds — ABNORMAL HIGH (ref 11.4–15.2)

## 2017-08-30 NOTE — Telephone Encounter (Signed)
I spoke with patient this morning regarding lab appt & suggested that she is drawn at the hospital today to prevent multiple attempts. Pt agreed & ordered placed.

## 2017-08-31 ENCOUNTER — Telehealth: Payer: Self-pay

## 2017-08-31 DIAGNOSIS — Z5181 Encounter for therapeutic drug level monitoring: Secondary | ICD-10-CM

## 2017-08-31 DIAGNOSIS — Z7901 Long term (current) use of anticoagulants: Principal | ICD-10-CM

## 2017-08-31 NOTE — Telephone Encounter (Signed)
Ordered lab for lab appt on 09/14/2017

## 2017-09-11 MED ORDER — MYCOPHENOLATE MOFETIL 500 MG TABLET
ORAL_TABLET | 3 refills | 0 days | Status: CP
Start: 2017-09-11 — End: 2017-11-13

## 2017-09-14 ENCOUNTER — Other Ambulatory Visit (INDEPENDENT_AMBULATORY_CARE_PROVIDER_SITE_OTHER): Payer: Medicare Other

## 2017-09-14 DIAGNOSIS — Z5181 Encounter for therapeutic drug level monitoring: Secondary | ICD-10-CM | POA: Diagnosis not present

## 2017-09-14 DIAGNOSIS — Z7901 Long term (current) use of anticoagulants: Secondary | ICD-10-CM | POA: Diagnosis not present

## 2017-09-14 LAB — PROTIME-INR
INR: 2.6 ratio — ABNORMAL HIGH (ref 0.8–1.0)
Prothrombin Time: 28.1 s — ABNORMAL HIGH (ref 9.6–13.1)

## 2017-10-05 ENCOUNTER — Telehealth: Payer: Self-pay | Admitting: Internal Medicine

## 2017-10-05 ENCOUNTER — Ambulatory Visit
Admission: RE | Admit: 2017-10-05 | Discharge: 2017-10-05 | Disposition: A | Discharge status: Home / Self Care | Payer: Medicare Other | Source: Ambulatory Visit | Attending: Oncology | Admitting: Oncology

## 2017-10-05 ENCOUNTER — Other Ambulatory Visit: Payer: Self-pay | Admitting: Oncology

## 2017-10-05 DIAGNOSIS — C50412 Malignant neoplasm of upper-outer quadrant of left female breast: Secondary | ICD-10-CM | POA: Insufficient documentation

## 2017-10-05 DIAGNOSIS — R922 Inconclusive mammogram: Secondary | ICD-10-CM | POA: Diagnosis not present

## 2017-10-05 HISTORY — DX: Personal history of irradiation: Z92.3

## 2017-10-05 NOTE — Telephone Encounter (Signed)
FYI

## 2017-10-05 NOTE — Telephone Encounter (Signed)
Copied from [place] [id number]. Topic: Referral - Request >> Oct 05, 2017  4:05 PM Robina Ade, Helene Kelp D wrote: Reason for CRM: Patient called and would like a referral to see a urologist. She would like to talk to [facility]. Please call patient back, thanks.

## 2017-10-06 ENCOUNTER — Ambulatory Visit: Payer: Self-pay | Admitting: *Deleted

## 2017-10-06 ENCOUNTER — Telehealth: Payer: Self-pay | Admitting: Internal Medicine

## 2017-10-06 DIAGNOSIS — N8110 Cystocele, unspecified: Secondary | ICD-10-CM

## 2017-10-06 NOTE — Telephone Encounter (Signed)
Pt would like to go to [place] to see Hollice Espy, MD

## 2017-10-06 NOTE — Telephone Encounter (Signed)
Spoke with pt and informed her that the referral has been placed and if she does not hear from anyone in a week to please give our office a call. Pt gave a verbal understanding.

## 2017-10-06 NOTE — Telephone Encounter (Signed)
Please advise 

## 2017-10-06 NOTE — Telephone Encounter (Signed)
See previous message

## 2017-10-06 NOTE — Telephone Encounter (Signed)
Patient noticed she had bulge coming from her vagina earlier this week- she has her uterus - so not sure if it is uterus or bladder. She is having a lot of pressure with sitting. She is able to urinate and she is able to push the protrusion back up into the vagina. She can be reached for advisement- appointment or refrerral at [phone]  Reason for Disposition . [1] Something is hanging out of the vagina AND [2] cannot easily be pushed back inside  Answer Assessment - Initial Assessment Questions 1. SYMPTOM: "What's the main symptom you're concerned about?" (e.g., pain, itching, dryness)     Vaginal prolapse- patient has something protruding out of her vaginal 2. LOCATION: "Where is the  _______ located?" (e.g., inside/outside, left/right)     Vaginal opening 3. ONSET: "When did the  ________  start?"     monday 4. PAIN: "Is there any pain?" If so, ask: "How bad is it?" (Scale: 1-10; mild, moderate, severe)     Annoying pain- not enough to take medication 5. ITCHING: "Is there any itching?" If so, ask: "How bad is it?" (Scale: 1-10; mild, moderate, severe)     no 6. CAUSE: "What do you think is causing the symptoms?"     prolapse 7. OTHER SYMPTOMS: "Do you have any other symptoms?" (e.g., vaginal bleeding, pain with urination)     Patient is able to urinate- she is able to push it back up 8. PREGNANCY: "Is there any chance you are pregnant?" "When was your last menstrual period?"     n/a  7. CAUSE: "What do you think is causing the discharge?" "Have you had the same problem before? What happened then?" 8. OTHER SYMPTOMS: "Do you have any other symptoms?" (e.g., fever, itching, vaginal bleeding, pain with urination, injury to genital area, vaginal foreign body)  Protocols used: VAGINAL [facility]

## 2017-10-06 NOTE — Telephone Encounter (Signed)
CRM for notification. See Telephone encounter for:   10/06/17.   Relation to pt: self Call back number: [phone]   Reason for call:  Patient requesting referral to urologist due to prolapse bladder patient can physically see. Please place referral with Hollice Espy, MD  [place], [facility], 1st floor, [place], [place], [place] [address] [phone].  Patient transferred to [facility] triage nurse

## 2017-10-06 NOTE — Telephone Encounter (Signed)
This has been addressed in a previous message. Please see previous message.

## 2017-10-06 NOTE — Telephone Encounter (Signed)
  Your referral is in process as rested . Our referral coordinator will call you when the appointment has been made.  If you do not hear from [facility] in our office in a week,  Please call us back

## 2017-10-17 ENCOUNTER — Other Ambulatory Visit: Payer: Self-pay | Admitting: Radiology

## 2017-10-17 DIAGNOSIS — Z5181 Encounter for therapeutic drug level monitoring: Secondary | ICD-10-CM

## 2017-10-17 DIAGNOSIS — Z7901 Long term (current) use of anticoagulants: Principal | ICD-10-CM

## 2017-10-18 ENCOUNTER — Other Ambulatory Visit: Payer: Self-pay | Admitting: Gastroenterology

## 2017-10-18 ENCOUNTER — Telehealth: Payer: Self-pay | Admitting: Radiology

## 2017-10-18 ENCOUNTER — Ambulatory Visit
Admission: RE | Admit: 2017-10-18 | Discharge: 2017-10-18 | Disposition: A | Discharge status: Home / Self Care | Payer: Medicare Other | Source: Ambulatory Visit | Attending: Gastroenterology | Admitting: Gastroenterology

## 2017-10-18 ENCOUNTER — Other Ambulatory Visit (INDEPENDENT_AMBULATORY_CARE_PROVIDER_SITE_OTHER): Payer: Medicare Other

## 2017-10-18 DIAGNOSIS — R1310 Dysphagia, unspecified: Secondary | ICD-10-CM

## 2017-10-18 DIAGNOSIS — Z5181 Encounter for therapeutic drug level monitoring: Secondary | ICD-10-CM | POA: Diagnosis not present

## 2017-10-18 DIAGNOSIS — R1084 Generalized abdominal pain: Secondary | ICD-10-CM

## 2017-10-18 DIAGNOSIS — Z7901 Long term (current) use of anticoagulants: Secondary | ICD-10-CM | POA: Diagnosis not present

## 2017-10-18 DIAGNOSIS — E119 Type 2 diabetes mellitus without complications: Secondary | ICD-10-CM

## 2017-10-18 DIAGNOSIS — K5900 Constipation, unspecified: Secondary | ICD-10-CM | POA: Diagnosis not present

## 2017-10-18 LAB — PROTIME-INR
INR: 2.6 ratio — ABNORMAL HIGH (ref 0.8–1.0)
PROTHROMBIN TIME: 27.9 s — AB (ref 9.6–13.1)

## 2017-10-18 LAB — HEMOGLOBIN A1C: Hgb A1c MFr Bld: 6.2 % (ref 4.6–6.5)

## 2017-10-18 NOTE — Addendum Note (Signed)
Addended by: Arby Barrette on: 10/18/2017 02:59 PM   Modules accepted: Orders

## 2017-10-18 NOTE — Telephone Encounter (Signed)
Pt came in for INR today and asked if she could have her hemoglobin checked. Last A1C was on 05/17/17. Advised pt would ask provider to see. Drew A1C incase you like to add.

## 2017-10-18 NOTE — Telephone Encounter (Signed)
a1c added,  thanks

## 2017-10-30 ENCOUNTER — Encounter: Payer: Self-pay | Admitting: Urology

## 2017-10-30 ENCOUNTER — Ambulatory Visit (INDEPENDENT_AMBULATORY_CARE_PROVIDER_SITE_OTHER): Payer: Medicare Other | Admitting: Urology

## 2017-10-30 VITALS — BP 126/77 | HR 101 | Ht 61.0 in | Wt 130.0 lb

## 2017-10-30 DIAGNOSIS — N3946 Mixed incontinence: Secondary | ICD-10-CM

## 2017-10-30 DIAGNOSIS — R350 Frequency of micturition: Secondary | ICD-10-CM

## 2017-10-30 LAB — URINALYSIS, COMPLETE
BILIRUBIN UA: NEGATIVE
GLUCOSE, UA: NEGATIVE
NITRITE UA: NEGATIVE
Protein, UA: NEGATIVE
SPEC GRAV UA: 1.025 (ref 1.005–1.030)
Urobilinogen, Ur: 0.2 mg/dL (ref 0.2–1.0)
pH, UA: 5 (ref 5.0–7.5)

## 2017-10-30 LAB — MICROSCOPIC EXAMINATION: Epithelial Cells (non renal): NONE SEEN /hpf (ref 0–10)

## 2017-10-30 LAB — BLADDER SCAN AMB NON-IMAGING: SCAN RESULT: 0

## 2017-10-30 NOTE — Progress Notes (Signed)
10/30/2017 1:32 PM   Diana [name] Oct 09, 1934 [id number]  Referring provider: Sherlene Shams, MD [address] [place], [place] [address]  Chief Complaint  Patient presents with  . Other    Bladder prolapes    HPI: I was consulted to assess the patient for possible prolapse.  In the last several months with she feels a suprapubic pressure but no vaginal bulging.  She voids every 2 or 3 hours.  She gets up 4 times at night.  Her flow is reasonable but she can double void a similar amount.  She does normally feel empty  She may have had retention or incontinence when she saw Dr. Achilles Dunk a few years ago with shingles but now is continent.  She has had back procedures.  She has not had previous GU surgery or kidney stones.  She has had a hysterectomy.  She does not get a lot of bladder infections  Modifying factors: There are no other modifying factors  Associated signs and symptoms: There are no other associated signs and symptoms Aggravating and relieving factors: There are no other aggravating or relieving factors Severity: Moderate Duration: Persistent   PMH: Past Medical History:  Diagnosis Date  . Acute glomerulonephritis with other specified pathological lesion in kidney in disease classified elsewhere(580.81)   . Alveolar aeration decreased   . Anemia   . Arthritis   . Atrial fibrillation (HCC)   . Breast cancer (HCC) 05/05/202015   positive, radiation  . Cancer (HCC)    Breast  . CHF (congestive heart failure) (HCC)   . Diaphragmatic hernia   . DVT (deep venous thrombosis) (HCC)   . Dysrhythmia   . Environmental allergies   . Esophageal reflux   . Feeding problem    FEEDING TUBE  . Hiatal hernia   . Hyperlipidemia   . Hypertension   . Lower extremity edema   . Lupus (systemic lupus erythematosus) (HCC)   . Neuropathy   . Osteopenia   . Oxygen deficiency    USES HS  . Peripheral neuropathy, hereditary/idiopathic   . Personal history of  radiation therapy   . Pneumonia   . Primary pulmonary hypertension (HCC)   . Pulmonary hypertension (HCC)   . Renal insufficiency   . Sciatica   . Shingles   . Shingles (herpes zoster) polyneuropathy March 2013   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  . Shortness of breath dyspnea   . Unspecified hypothyroidism   . Unspecified menopausal and postmenopausal disorder   . Urinary incontinence without sensory awareness   . Vitamin B deficiency     Surgical History: Past Surgical History:  Procedure Laterality Date  . BREAST BIOPSY Left 2015   invasive mammary carcinoma  . BREAST LUMPECTOMY Left 2015   invasive mammary carcinoma clear margins but close  . BREAST SURGERY    . CATARACT EXTRACTION W/PHACO Right 04/05/2016   Procedure: CATARACT EXTRACTION PHACO AND INTRAOCULAR LENS PLACEMENT (IOC);  Surgeon: Galen Manila, MD;  Location: [facility] ORS;  Service: Ophthalmology;  Laterality: Right;  [place] 01:20 AP% 23.9 CDE 19.29 fluid pack lot # [id number]  . CATARACT EXTRACTION W/PHACO Left 04/26/2016   Procedure: CATARACT EXTRACTION PHACO AND INTRAOCULAR LENS PLACEMENT (IOC);  Surgeon: Galen Manila, MD;  Location: [facility];  Service: Ophthalmology;  Laterality: Left;   [place] 00:59 AP% 23.2 CDE 13.83 Fluid pack lot # [id number]  . DIAGNOSTIC LAPAROSCOPY    . ESOPHAGOGASTRODUODENOSCOPY N/A 07/19/2015   Procedure: ESOPHAGOGASTRODUODENOSCOPY (EGD);  Surgeon: Wallace Cullens,  MD;  Location: [facility] ENDOSCOPY;  Service: Gastroenterology;  Laterality: N/A;  . ESOPHAGOGASTRODUODENOSCOPY (EGD) WITH PROPOFOL N/A 03/28/2015   Procedure: ESOPHAGOGASTRODUODENOSCOPY (EGD) WITH PROPOFOL;  Surgeon: Earline Mayotte, MD;  Location: [facility] ENDOSCOPY;  Service: Endoscopy;  Laterality: N/A;  . ESOPHAGOGASTRODUODENOSCOPY (EGD) WITH PROPOFOL N/A 06/10/2015   Procedure: ESOPHAGOGASTRODUODENOSCOPY (EGD) WITH PROPOFOL;  Surgeon: Christena Deem, MD;  Location: [facility] ENDOSCOPY;  Service: Endoscopy;  Laterality: N/A;  .  ESOPHAGOGASTRODUODENOSCOPY (EGD) WITH PROPOFOL N/A 10/20/2015   Procedure: ESOPHAGOGASTRODUODENOSCOPY (EGD) WITH PROPOFOL;  Surgeon: Christena Deem, MD;  Location[facility];  Service: Endoscopy;  Laterality: N/A;  . ESOPHAGOGASTRODUODENOSCOPY (EGD) WITH PROPOFOL N/A 02/23/2016   Procedure: ESOPHAGOGASTRODUODENOSCOPY (EGD) WITH PROPOFOL;  Surgeon: Christena Deem, MD;  Location: [facility] ENDOSCOPY;  Service: Endoscopy;  Laterality: N/A;  . ESOPHAGOGASTRODUODENOSCOPY (EGD) WITH PROPOFOL N/A 06/30/2016   Procedure: ESOPHAGOGASTRODUODENOSCOPY (EGD) WITH PROPOFOL;  Surgeon: Christena Deem, MD;  Location: [facility] ENDOSCOPY;  Service: Endoscopy;  Laterality: N/A;  . ESOPHAGOGASTRODUODENOSCOPY (EGD) WITH PROPOFOL N/A 01/05/2017   Procedure: ESOPHAGOGASTRODUODENOSCOPY (EGD) WITH PROPOFOL;  Surgeon: Christena Deem, MD;  Location: [facility] ENDOSCOPY;  Service: Endoscopy;  Laterality: N/A;  . KYPHOPLASTY N/A 02/10/2015   Procedure: [id number];  Surgeon: Kennedy Bucker, MD;  Location: [facility] ORS;  Service: Orthopedics;  Laterality: N/A;  . LAPAROTOMY N/A 03/11/2015   Procedure: REDUCTION OF GASTRIC VOLVULUS, SPLEENECTOMY, GASTRIC TUBE PLACEMENT;  Surgeon: Earline Mayotte, MD;  Location: [facility] OR[facility];  Service: General;  Laterality: N/A;  . MOHS SURGERY    . PERIPHERAL VASCULAR CATHETERIZATION N/A 06/12/2015   Procedure: IVC Filter Insertion;  Surgeon: Annice Needy, MD;  Location: [facility] INVASIVE CV LAB;  Service: Cardiovascular;  Laterality: N/A;  . PERIPHERAL VASCULAR CATHETERIZATION N/A 08/23/2016   Procedure: IVC Filter Removal;  Surgeon: Renford Dills, MD;  Location: [facility] INVASIVE CV LAB;  Service: Cardiovascular;  Laterality: N/A;  . STOMACH SURGERY     for volvolus    Home Medications:  Allergies as of 10/30/2017      Reactions   Tramadol Other (See Comments), Rash   Rash   Amoxicillin Other (See Comments)   Reaction:  Stomach cramps    Cefuroxime Itching   Contrast Media [iodinated Diagnostic  Agents] Hives   Metrizamide Hives   Morphine And Related    Reaction: face flushed    Propoxyphene    Stomach cramp   Cephalexin Rash   Sulfa Antibiotics Rash   REACTION: Rash   Sulfonamide Derivatives Rash   Tramadol Hcl Rash   Venlafaxine Rash      Medication List        Accurate as of 10/30/17  1:32 PM. Always use your most recent med list.          ADCIRCA 20 MG tablet Generic drug:  tadalafil (PAH) Take 40 mg by mouth daily.   Calcium Carbonate-Vitamin D 600-400 MG-UNIT tablet Take 1 tablet by mouth daily.   cyanocobalamin 1000 MCG/ML injection Commonly known as:  (VITAMIN B-12) Inject 1 mL (1,000 mcg total) into the muscle every 30 (thirty) days. Pt uses on the 1st of every month.   hydroxychloroquine 200 MG tablet Commonly known as:  PLAQUENIL Take 200 mg by mouth daily.   letrozole 2.5 MG tablet Commonly known as:  FEMARA Take 1 tablet (2.5 mg total) by mouth daily.   levothyroxine 150 MCG tablet Commonly known as:  SYNTHROID, LEVOTHROID Take 1 tablet (150 mcg total) by mouth daily.   lisinopril 5 MG tablet Commonly known  as:  PRINIVIL,ZESTRIL TAKE 1 TABLET (5 MG TOTAL) BY MOUTH DAILY.   metoCLOPramide 10 MG tablet Commonly known as:  REGLAN TAKE 1 TABLET (10 MG TOTAL) BY MOUTH 4 (FOUR) TIMES DAILY - BEFORE MEALS AND AT BEDTIME.   metoprolol succinate 25 MG 24 hr tablet Commonly known as:  TOPROL-XL Take 12.5 mg by mouth daily.   mycophenolate 500 MG tablet Commonly known as:  CELLCEPT Take 1,000 mg by mouth 2 (two) times daily.   ondansetron 4 MG disintegrating tablet Commonly known as:  ZOFRAN-ODT Take 1 tablet (4 mg total) by mouth every 4 (four) hours as needed for nausea or vomiting.   oxyCODONE-acetaminophen 5-325 MG tablet Commonly known as:  PERCOCET/ROXICET Take 1 tablet by mouth every 8 (eight) hours as needed for severe pain.   OXYGEN Inhale 2 mLs into the lungs at bedtime.   pantoprazole 40 MG tablet Commonly known as:   PROTONIX Take 40 mg by mouth 2 (two) times daily.   predniSONE 5 MG tablet Commonly known as:  DELTASONE Take 5 mg by mouth daily.   promethazine 12.5 MG tablet Commonly known as:  PHENERGAN Take 1 tablet (12.5 mg total) by mouth every 8 (eight) hours as needed for nausea or vomiting.   REFRESH OP Place 2 drops into both eyes daily as needed (dry eyes).   sterile water for irrigation 237 mL Give 2.5 mL/hr by tube continuous.   sucralfate 1 GM/10ML suspension Commonly known as:  CARAFATE Take 1 g by mouth 4 (four) times daily. Before meals and nightly   SYRINGE 3CC/25GX1" 25G X 1" 3 ML Misc Use for b12 injections   vitamin C 1000 MG tablet Take 1,000 mg by mouth daily.   warfarin 3 MG tablet Commonly known as:  COUMADIN Take 1 tablet (3 mg total) by mouth daily.       Allergies:  Allergies  Allergen Reactions  . Tramadol Other (See Comments) and Rash    Rash  . Amoxicillin Other (See Comments)    Reaction:  Stomach cramps   . Cefuroxime Itching  . Contrast Media [Iodinated Diagnostic Agents] Hives  . Metrizamide Hives  . Morphine And Related     Reaction: face flushed    . Propoxyphene     Stomach cramp  . Cephalexin Rash  . Sulfa Antibiotics Rash    REACTION: Rash  . Sulfonamide Derivatives Rash  . Tramadol Hcl Rash  . Venlafaxine Rash    Family History: Family History  Problem Relation Age of Onset  . Colon cancer Mother   . Alcohol abuse Sister   . Breast cancer Sister 67  . Cancer Brother   . Lung cancer Son     Social History:  reports that she quit smoking about 26 years ago. she has never used smokeless tobacco. She reports that she drinks alcohol. She reports that she does not use drugs.  ROS: UROLOGY Frequent Urination?: Yes Hard to postpone urination?: No Burning/pain with urination?: No Get up at night to urinate?: Yes Leakage of urine?: No Urine stream starts and stops?: Yes Trouble starting stream?: No Do you have to strain to  urinate?: No Blood in urine?: Yes Urinary tract infection?: No Sexually transmitted disease?: No Injury to kidneys or bladder?: No Painful intercourse?: No Weak stream?: No Currently pregnant?: No Vaginal bleeding?: No Last menstrual period?: n  Gastrointestinal Nausea?: Yes Vomiting?: No Indigestion/heartburn?: No Diarrhea?: No Constipation?: Yes  Constitutional Fever: No Night sweats?: No Weight loss?: No Fatigue?: Yes  Skin Skin rash/lesions?: Yes Itching?: No  Eyes Blurred vision?: No Double vision?: No  Ears/Nose/Throat Sore throat?: No Sinus problems?: Yes  Hematologic/Lymphatic Swollen glands?: No Easy bruising?: Yes  Cardiovascular Leg swelling?: Yes Chest pain?: No  Respiratory Cough?: No Shortness of breath?: Yes  Endocrine Excessive thirst?: No  Musculoskeletal Back pain?: Yes Joint pain?: No  Neurological Headaches?: No Dizziness?: No  Psychologic Depression?: No Anxiety?: No  Physical Exam: BP 126/77   Pulse (!) 101   Ht 5\' 1"  (1.549 m)   Wt 130 lb (59 kg)   BMI 24.56 kg/m   Constitutional:  Alert and oriented, No acute distress. HEENT: [place] AT, moist mucus membranes.  Trachea midline, no masses. Cardiovascular: No clubbing, cyanosis, or edema. Respiratory: Normal respiratory effort, no increased work of breathing. GI: Abdomen is soft, nontender, nondistended, no abdominal masses GU: No CVA tenderness.  Grade 2 cystocele with no rectocele.  No  loss of cuff support. Skin: No rashes, bruises or suspicious lesions. Lymph: No cervical or inguinal adenopathy. Neurologic: Grossly intact, no focal deficits, moving all 4 extremities. Psychiatric: Normal mood and affect.  Laboratory Data: Lab Results  Component Value Date   WBC 8.1 05/26/2017   HGB 10.7 (L) 05/26/2017   HCT 32.4 (L) 05/26/2017   MCV 93.3 05/26/2017   PLT 333 05/26/2017    Lab Results  Component Value Date   CREATININE 1.28 (H) 08/08/2017    No results  found for: PSA  No results found for: TESTOSTERONE  Lab Results  Component Value Date   HGBA1C 6.2 10/18/2017    Urinalysis    Component Value Date/Time   COLORURINE YELLOW (A) 07/17/2015 1442   APPEARANCEUR CLEAR (A) 07/17/2015 1442   APPEARANCEUR Clear 02/23/2013 2135   LABSPEC 1.014 07/17/2015 1442   LABSPEC 1.019 02/23/2013 2135   PHURINE 6.0 07/17/2015 1442   GLUCOSEU NEGATIVE 07/17/2015 1442   GLUCOSEU Negative 02/23/2013 2135   GLUCOSEU NEGATIVE 12/24/2012 1454   HGBUR NEGATIVE 07/17/2015 1442   HGBUR trace-intact 03/10/2009 1351   BILIRUBINUR NEGATIVE 07/17/2015 1442   BILIRUBINUR neg 12/17/2014 1419   BILIRUBINUR Negative 02/23/2013 2135   KETONESUR NEGATIVE 07/17/2015 1442   PROTEINUR NEGATIVE 07/17/2015 1442   UROBILINOGEN 0.2 12/17/2014 1419   UROBILINOGEN 0.2 12/24/2012 1454   NITRITE NEGATIVE 07/17/2015 1442   LEUKOCYTESUR TRACE (A) 07/17/2015 1442   LEUKOCYTESUR Negative 02/23/2013 2135    Pertinent Imaging: none  Assessment & Plan: The patient has suprapubic pressure.  She has a double voiding pattern.  She has significant nocturia.  Urine was sent for culture.  In my opinion the patient suprapubic pressure is not from prolapse.  Her prolapse was minimal and likely asymptomatic.  Rarely atrophy can cause the symptoms but he did not recommend local estrogen cream.  I will call if the urine culture is positive.  The role of cystoscopy was discussed.  Cystoscopy in the culture are normal one could make the case of a CT scan  There are no diagnoses linked to this encounter.  No Follow-up on file.  Martina Sinner, MD  [facility] [address], [address] [place], [place] [address] [phone]

## 2017-11-01 LAB — CULTURE, URINE COMPREHENSIVE

## 2017-11-08 ENCOUNTER — Encounter: Payer: Self-pay | Admitting: Internal Medicine

## 2017-11-08 ENCOUNTER — Ambulatory Visit (INDEPENDENT_AMBULATORY_CARE_PROVIDER_SITE_OTHER): Payer: Medicare Other | Admitting: Internal Medicine

## 2017-11-08 DIAGNOSIS — K5900 Constipation, unspecified: Secondary | ICD-10-CM

## 2017-11-08 DIAGNOSIS — K439 Ventral hernia without obstruction or gangrene: Secondary | ICD-10-CM | POA: Diagnosis not present

## 2017-11-08 DIAGNOSIS — B372 Candidiasis of skin and nail: Secondary | ICD-10-CM | POA: Diagnosis not present

## 2017-11-08 MED ORDER — OXYCODONE-ACETAMINOPHEN 5-325 MG PO TABS: 1.0000 | ORAL_TABLET | Freq: Three times a day (TID) | ORAL | 0 refills | Status: DC | PRN

## 2017-11-08 MED ORDER — WARFARIN SODIUM 4 MG PO TABS: 4.0000 mg | ORAL_TABLET | Freq: Every day | ORAL | 2 refills | Status: DC

## 2017-11-08 MED ORDER — WARFARIN SODIUM 3 MG PO TABS: 3.0000 mg | ORAL_TABLET | Freq: Every day | ORAL | 2 refills | Status: DC

## 2017-11-08 MED ORDER — LACTULOSE 20 GM/30ML PO SOLN: ORAL | 3 refills | Status: DC

## 2017-11-08 MED ORDER — NYSTATIN 100000 UNIT/GM EX CREA: 1.0000 "application " | TOPICAL_CREAM | Freq: Two times a day (BID) | CUTANEOUS | 0 refills | Status: DC

## 2017-11-08 MED ORDER — LINACLOTIDE 145 MCG PO CAPS: 145.0000 ug | ORAL_CAPSULE | Freq: Every day | ORAL | 2 refills | Status: DC

## 2017-11-08 NOTE — Progress Notes (Signed)
Subjective:  Patient ID: Diana [name], female    DOB: 02/10/1935  Age: 83 y.o. MRN: [id number]  CC: Diagnoses of Constipation, unspecified constipation type, Ventral hernia without obstruction or gangrene, and Candidiasis of skin were pertinent to this visit.  HPI Diana [name] presents for follow up on multiple chronic issues    Saw urology for s/p pain and pressure , feels like she needs to void,  Present both sitting and standing . Some urinary hesitancy.  No dysuria   Culture was negativee.   cysto planned .  Has chronic constipation due to percocet.   2 dulcolax makes cramping worse.   Has deferred colonoscopy due to age.   Has a asymptomatic ventral hernia on the right side adjacent to abdominal scar  Has a itchy sin rash under breast , stopped wearing a bra bcit was aggravating the rash   Posture is getting worse due t severe scoliosis.     Lab Results  Component Value Date   INR 2.6 (H) 10/18/2017   INR 2.6 (H) 09/14/2017   INR 1.44 08/30/2017     Outpatient Medications Prior to Visit  Medication Sig Dispense Refill  . Ascorbic Acid (VITAMIN C) 1000 MG tablet Take 1,000 mg by mouth daily.    . Calcium Carbonate-Vitamin D 600-400 MG-UNIT tablet Take 1 tablet by mouth daily.    . cyanocobalamin (,VITAMIN B-12,) 1000 MCG/ML injection Inject 1 mL (1,000 mcg total) into the muscle every 30 (thirty) days. Pt uses on the 1st of every month. 10 mL 1  . hydroxychloroquine (PLAQUENIL) 200 MG tablet Take 200 mg by mouth daily.    Marland Kitchen letrozole (FEMARA) 2.5 MG tablet Take 1 tablet (2.5 mg total) by mouth daily. (Patient taking differently: Take 5 mg by mouth daily. ) 90 tablet 3  . levothyroxine (SYNTHROID, LEVOTHROID) 150 MCG tablet Take 1 tablet (150 mcg total) by mouth daily. 90 tablet 3  . lisinopril (PRINIVIL,ZESTRIL) 5 MG tablet TAKE 1 TABLET (5 MG TOTAL) BY MOUTH DAILY. 90 tablet 3  . metoCLOPramide (REGLAN) 10 MG tablet TAKE 1 TABLET (10 MG TOTAL) BY MOUTH 4 (FOUR) TIMES  DAILY - BEFORE MEALS AND AT BEDTIME. 360 tablet 1  . metoprolol succinate (TOPROL-XL) 25 MG 24 hr tablet Take 12.5 mg by mouth daily.     . mycophenolate (CELLCEPT) 500 MG tablet Take 1,000 mg by mouth 2 (two) times daily.    . ondansetron (ZOFRAN-ODT) 4 MG disintegrating tablet Take 1 tablet (4 mg total) by mouth every 4 (four) hours as needed for nausea or vomiting. 60 tablet 5  . OXYGEN Inhale 2 mLs into the lungs at bedtime.    . pantoprazole (PROTONIX) 40 MG tablet Take 40 mg by mouth 2 (two) times daily.    . Polyvinyl Alcohol-Povidone (REFRESH OP) Place 2 drops into both eyes daily as needed (dry eyes).     . predniSONE (DELTASONE) 5 MG tablet Take 5 mg by mouth daily.     . promethazine (PHENERGAN) 12.5 MG tablet Take 1 tablet (12.5 mg total) by mouth every 8 (eight) hours as needed for nausea or vomiting. 20 tablet 5  . sterile water for irrigation 237 mL Give 2.5 mL/hr by tube continuous.    . sucralfate (CARAFATE) 1 GM/10ML suspension Take 1 g by mouth 4 (four) times daily. Before meals and nightly    . Syringe/Needle, Disp, (SYRINGE 3CC/25GX1") 25G X 1" 3 ML MISC Use for b12 injections 50 each 0  . Tadalafil,  PAH, (ADCIRCA) 20 MG TABS Take 40 mg by mouth daily.    Marland Kitchen oxyCODONE-acetaminophen (PERCOCET/ROXICET) 5-325 MG tablet Take 1 tablet by mouth every 8 (eight) hours as needed for severe pain. 65 tablet 0  . warfarin (COUMADIN) 3 MG tablet Take 1 tablet (3 mg total) by mouth daily. 30 tablet 2   No facility-administered medications prior to visit.     Review of Systems;  Patient denies headache, fevers, malaise, unintentional weight loss, skin rash, eye pain, sinus congestion and sinus pain, sore throat, dysphagia,  hemoptysis , cough, dyspnea, wheezing, chest pain, palpitations, orthopnea, edema, abdominal pain, nausea, melena, diarrhea, constipation, flank pain, dysuria, hematuria, urinary  Frequency, nocturia, numbness, tingling, seizures,  Focal weakness, Loss of consciousness,   Tremor, insomnia, depression, anxiety, and suicidal ideation.      Objective:  BP 120/62 (BP Location: Right Arm, Patient Position: Sitting, Cuff Size: Normal)   Pulse (!) 101   Temp 98.3 F (36.8 C) (Oral)   Resp 16   Ht 5\' 1"  (1.549 m)   Wt 130 lb 6.4 oz (59.1 kg)   SpO2 93%   BMI 24.64 kg/m   BP Readings from Last 3 Encounters:  11/08/17 120/62  10/30/17 126/77  08/08/17 118/70    Wt Readings from Last 3 Encounters:  11/08/17 130 lb 6.4 oz (59.1 kg)  10/30/17 130 lb (59 kg)  08/08/17 128 lb (58.1 kg)    General appearance: alert, cooperative and appears stated age Ears: normal TM's and external ear canals both ears Throat: lips, mucosa, and tongue normal; teeth and gums normal Neck: no adenopathy, no carotid bruit, supple, symmetrical, trachea midline and thyroid not enlarged, symmetric, no tenderness/mass/nodules Back: symmetric, no curvature. ROM normal. No CVA tenderness. Lungs: clear to auscultation bilaterally Heart: regular rate and rhythm, S1, S2 normal, no murmur, click, rub or gallop Abdomen: soft, non-tender; bowel sounds normal; no masses,  no organomegaly Pulses: 2+ and symmetric Skin: Skin color, texture, turgor normal. No rashes or lesions Lymph nodes: Cervical, supraclavicular, and axillary nodes normal.  Lab Results  Component Value Date   HGBA1C 6.2 10/18/2017   HGBA1C 6.3 05/17/2017   HGBA1C 5.8 09/14/2016    Lab Results  Component Value Date   CREATININE 1.28 (H) 08/08/2017   CREATININE 1.26 (H) 05/26/2017   CREATININE 1.00 05/17/2017    Lab Results  Component Value Date   WBC 8.1 05/26/2017   HGB 10.7 (L) 05/26/2017   HCT 32.4 (L) 05/26/2017   PLT 333 05/26/2017   GLUCOSE 95 08/08/2017   CHOL 164 10/11/2013   TRIG 152.0 (H) 10/11/2013   HDL 69.70 10/11/2013   LDLDIRECT 50.0 05/09/2016   LDLCALC 64 10/11/2013   ALT 10 (L) 05/26/2017   AST 22 05/26/2017   NA 141 08/08/2017   K 4.2 08/08/2017   CL 109 08/08/2017   CREATININE  1.28 (H) 08/08/2017   BUN 24 (H) 08/08/2017   CO2 24 08/08/2017   TSH 0.67 06/30/2017   INR 2.6 (H) 10/18/2017   HGBA1C 6.2 10/18/2017   MICROALBUR 8.7 (H) 09/14/2016    Dg Abd 2 Views  Result Date: 10/18/2017 CLINICAL DATA:  Abdominal pain, constipation EXAM: ABDOMEN - 2 VIEW COMPARISON:  11/02/2015 FINDINGS: There is normal bowel gas pattern. No free air. No organomegaly or suspicious calcification. No acute bony abnormality. Severe scoliosis in the lumbar spine. Prior vertebroplasty at L2 and L3. IMPRESSION: No acute findings. Electronically Signed   By: Charlett Nose M.D.   On: 10/18/2017 17:39  Assessment & Plan:   Problem List Items Addressed This Visit    Constipation    Contributing to her suprapubic pain.  Trila of Linzess..  If too expensive,  Trial of Lactulose        Ventral hernia without obstruction or gangrene    She will need to see a general surgeon . Referral pending discussion with her daughter.       Candidiasis of skin    Nystatin cream twice daily,  Followed by Doylene Canning Bond medicated powder       Relevant Medications   nystatin cream (MYCOSTATIN)    A total of 25 minutes of face to face time was spent with patient more than half of which was spent in counselling about the above mentioned conditions  and coordination of care   I am having Jakylah B. Alcocer start on warfarin, nystatin cream, linaclotide, and Lactulose. I am also having her maintain her hydroxychloroquine, tadalafil (PAH), predniSONE, metoprolol succinate, mycophenolate, Polyvinyl Alcohol-Povidone (REFRESH OP), OXYGEN, sterile water for irrigation 237 mL, letrozole, Calcium Carbonate-Vitamin D, pantoprazole, cyanocobalamin, SYRINGE 3CC/25GX1", vitamin C, sucralfate, levothyroxine, metoCLOPramide, lisinopril, ondansetron, promethazine, warfarin, and oxyCODONE-acetaminophen.  Meds ordered this encounter  Medications  . warfarin (COUMADIN) 3 MG tablet    Sig: Take 1 tablet (3 mg total) by mouth  daily.    Dispense:  30 tablet    Refill:  2  . warfarin (COUMADIN) 4 MG tablet    Sig: Take 1 tablet (4 mg total) by mouth daily.    Dispense:  30 tablet    Refill:  2  . nystatin cream (MYCOSTATIN)    Sig: Apply 1 application topically 2 (two) times daily.    Dispense:  30 g    Refill:  0  . DISCONTD: oxyCODONE-acetaminophen (PERCOCET/ROXICET) 5-325 MG tablet    Sig: Take 1 tablet by mouth every 8 (eight) hours as needed for severe pain.    Dispense:  65 tablet    Refill:  0    May refill on or after November 11 2017  . linaclotide (LINZESS) 145 MCG CAPS capsule    Sig: Take 1 capsule (145 mcg total) by mouth daily before breakfast.    Dispense:  30 capsule    Refill:  2  . Lactulose 20 GM/30ML SOLN    Sig: 30 ml every 4 hours until constipation is relieved    Dispense:  236 mL    Refill:  3  . DISCONTD: oxyCODONE-acetaminophen (PERCOCET/ROXICET) 5-325 MG tablet    Sig: Take 1 tablet by mouth every 8 (eight) hours as needed for severe pain.    Dispense:  65 tablet    Refill:  0    May refill on or after December 09 2017  . oxyCODONE-acetaminophen (PERCOCET/ROXICET) 5-325 MG tablet    Sig: Take 1 tablet by mouth every 8 (eight) hours as needed for severe pain.    Dispense:  65 tablet    Refill:  0    May refill on or after January 09 2018    Medications Discontinued During This Encounter  Medication Reason  . warfarin (COUMADIN) 3 MG tablet Reorder  . oxyCODONE-acetaminophen (PERCOCET/ROXICET) 5-325 MG tablet Reorder  . oxyCODONE-acetaminophen (PERCOCET/ROXICET) 5-325 MG tablet Reorder  . oxyCODONE-acetaminophen (PERCOCET/ROXICET) 5-325 MG tablet Reorder    Follow-up: Return in about 3 months (around 02/05/2018) for follow up diabetes.   Sherlene Shams, MD

## 2017-11-08 NOTE — Patient Instructions (Addendum)
I  AM  PRESCRIBING Nystatin cream to use  twice daily  until your rash has resolvedand keep a cotton barrier between your breasts and your abdominal wall (so you don' have a skin on skin seal   Once the rash has resolved,  Use [employer] Bond medicated powder after you bathe to prevent recurrence   You need a surgery referral for evaluation of your ventral hernia    Your bladder pain may be coming in part from your constipation  I have prescribed 2 mediations:  Linzess  Take once daily every day   Lactulose take as needed (liquid cathartic)

## 2017-11-09 DIAGNOSIS — K439 Ventral hernia without obstruction or gangrene: Secondary | ICD-10-CM | POA: Insufficient documentation

## 2017-11-09 DIAGNOSIS — B372 Candidiasis of skin and nail: Secondary | ICD-10-CM | POA: Insufficient documentation

## 2017-11-09 NOTE — Assessment & Plan Note (Signed)
She will need to see a Education officer, environmental . Referral pending discussion with her daughter.

## 2017-11-09 NOTE — Assessment & Plan Note (Signed)
Contributing to her suprapubic pain.  Trila of Linzess..  If too expensive,  Trial of Lactulose

## 2017-11-09 NOTE — Assessment & Plan Note (Signed)
Nystatin cream twice daily,  Followed by Gold Bond medicated powder

## 2017-11-13 ENCOUNTER — Ambulatory Visit: Admit: 2017-11-13 | Discharge: 2017-11-14 | Payer: MEDICARE

## 2017-11-13 ENCOUNTER — Other Ambulatory Visit: Payer: Self-pay | Admitting: *Deleted

## 2017-11-13 DIAGNOSIS — M3214 Glomerular disease in systemic lupus erythematosus: Secondary | ICD-10-CM

## 2017-11-13 DIAGNOSIS — L930 Discoid lupus erythematosus: Secondary | ICD-10-CM

## 2017-11-13 DIAGNOSIS — M328 Other forms of systemic lupus erythematosus: Principal | ICD-10-CM

## 2017-11-13 DIAGNOSIS — R1310 Dysphagia, unspecified: Secondary | ICD-10-CM | POA: Diagnosis not present

## 2017-11-13 DIAGNOSIS — J849 Interstitial pulmonary disease, unspecified: Secondary | ICD-10-CM | POA: Diagnosis not present

## 2017-11-13 DIAGNOSIS — M810 Age-related osteoporosis without current pathological fracture: Secondary | ICD-10-CM | POA: Diagnosis not present

## 2017-11-13 DIAGNOSIS — I272 Pulmonary hypertension, unspecified: Secondary | ICD-10-CM | POA: Diagnosis not present

## 2017-11-13 DIAGNOSIS — Z79811 Long term (current) use of aromatase inhibitors: Secondary | ICD-10-CM | POA: Diagnosis not present

## 2017-11-13 DIAGNOSIS — C50412 Malignant neoplasm of upper-outer quadrant of left female breast: Secondary | ICD-10-CM

## 2017-11-13 DIAGNOSIS — Z86718 Personal history of other venous thrombosis and embolism: Secondary | ICD-10-CM | POA: Diagnosis not present

## 2017-11-13 DIAGNOSIS — Z9049 Acquired absence of other specified parts of digestive tract: Secondary | ICD-10-CM | POA: Diagnosis not present

## 2017-11-13 DIAGNOSIS — Z931 Gastrostomy status: Secondary | ICD-10-CM | POA: Diagnosis not present

## 2017-11-13 DIAGNOSIS — Z882 Allergy status to sulfonamides status: Secondary | ICD-10-CM | POA: Diagnosis not present

## 2017-11-13 DIAGNOSIS — R5383 Other fatigue: Secondary | ICD-10-CM | POA: Diagnosis not present

## 2017-11-13 DIAGNOSIS — Z7901 Long term (current) use of anticoagulants: Secondary | ICD-10-CM | POA: Diagnosis not present

## 2017-11-13 MED ORDER — LETROZOLE 2.5 MG PO TABS: 2.5000 mg | ORAL_TABLET | Freq: Every day | ORAL | 0 refills | Status: DC

## 2017-11-13 MED ORDER — MYCOPHENOLATE MOFETIL 500 MG TABLET
ORAL_TABLET | Freq: Two times a day (BID) | ORAL | 3 refills | 0 days | Status: CP
Start: 2017-11-13 — End: 2018-04-25

## 2017-11-13 MED ORDER — PREDNISONE 5 MG TABLET
ORAL_TABLET | Freq: Every day | ORAL | 5 refills | 0 days | Status: CP
Start: 2017-11-13 — End: 2018-04-25

## 2017-11-20 ENCOUNTER — Encounter: Payer: Self-pay | Admitting: Urology

## 2017-11-20 ENCOUNTER — Ambulatory Visit (INDEPENDENT_AMBULATORY_CARE_PROVIDER_SITE_OTHER): Payer: Medicare Other | Admitting: Urology

## 2017-11-20 VITALS — BP 124/75 | HR 90 | Ht 61.0 in | Wt 129.9 lb

## 2017-11-20 DIAGNOSIS — R350 Frequency of micturition: Secondary | ICD-10-CM

## 2017-11-20 DIAGNOSIS — R3129 Other microscopic hematuria: Secondary | ICD-10-CM

## 2017-11-20 LAB — URINALYSIS, COMPLETE
Bilirubin, UA: NEGATIVE
Glucose, UA: NEGATIVE
Ketones, UA: NEGATIVE
LEUKOCYTES UA: NEGATIVE
Nitrite, UA: NEGATIVE
PH UA: 5 (ref 5.0–7.5)
RBC UA: NEGATIVE
Specific Gravity, UA: 1.025 (ref 1.005–1.030)
Urobilinogen, Ur: 0.2 mg/dL (ref 0.2–1.0)

## 2017-11-20 LAB — MICROSCOPIC EXAMINATION
EPITHELIAL CELLS (NON RENAL): NONE SEEN /HPF (ref 0–10)
WBC UA: NONE SEEN /HPF (ref 0–?)

## 2017-11-20 MED ORDER — LIDOCAINE HCL 2 % EX GEL
1.0000 "application " | Freq: Once | CUTANEOUS | Status: AC
  Administered 2017-11-20: 1 via URETHRAL

## 2017-11-20 MED ORDER — CIPROFLOXACIN HCL 500 MG PO TABS
500.0000 mg | ORAL_TABLET | Freq: Once | ORAL | Status: AC
  Administered 2017-11-20: 500 mg via ORAL

## 2017-11-20 NOTE — Progress Notes (Signed)
11/20/2017 3:39 PM   Diana [name] 04-07-1935 [id number]  Referring provider: Sherlene Shams, MD [address] [place], [place] [address]  Chief Complaint  Patient presents with  . Cysto    HPI: I was consulted to assess the patient for possible prolapse.  In the last several months with she feels a suprapubic pressure but no vaginal bulging.  She voids every 2 or 3 hours.  She gets up 4 times at night.  Her flow is reasonable but she can double void a similar amount.  She does normally feel empty  She may have had retention or incontinence when she saw Dr. Achilles Dunk a few years ago with shingles but now is continent.  She has had back procedures.  She has not had previous GU surgery or kidney stones.  She has had a hysterectomy.  Grade 2 cystocele with no rectocele.  No  loss of cuff support.  The patient has suprapubic pressure.  She has a double voiding pattern.  She has significant nocturia.  Urine was sent for culture.  In my opinion the patient suprapubic pressure is not from prolapse.  Her prolapse was minimal and likely asymptomatic.  Rarely atrophy can cause the symptoms but he did not recommend local estrogen cream.  I will call if the urine culture is positive.  The role of cystoscopy was discussed.  Cystoscopy in the culture are normal one could make the case of a CT scan  Today Frequency stable.  Urine culture negative.  Suprapubic pressure is much better and only mildly present.  On pelvic examination no prolapse noted  With verbal and written consent patient underwent flexible sterile cystoscopy.  Bladder mucosa and trigone were normal.  There was no cystitis.  There is no carcinoma.  Trigone was normal.  Ureters were normal.  Urethra was clear.  Procedure well tolerated    Today     PMH: Past Medical History:  Diagnosis Date  . Acute glomerulonephritis with other specified pathological lesion in kidney in disease classified elsewhere(580.81)     . Alveolar aeration decreased   . Anemia   . Arthritis   . Atrial fibrillation (HCC)   . Breast cancer (HCC) 26-Dec-202015   positive, radiation  . Cancer (HCC)    Breast  . CHF (congestive heart failure) (HCC)   . Diaphragmatic hernia   . DVT (deep venous thrombosis) (HCC)   . Dysrhythmia   . Environmental allergies   . Esophageal reflux   . Feeding problem    FEEDING TUBE  . Hiatal hernia   . Hyperlipidemia   . Hypertension   . Lower extremity edema   . Lupus (systemic lupus erythematosus) (HCC)   . Neuropathy   . Osteopenia   . Oxygen deficiency    USES HS  . Peripheral neuropathy, hereditary/idiopathic   . Personal history of radiation therapy   . Pneumonia   . Primary pulmonary hypertension (HCC)   . Pulmonary hypertension (HCC)   . Renal insufficiency   . Sciatica   . Shingles   . Shingles (herpes zoster) polyneuropathy March 2013   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  . Shortness of breath dyspnea   . Unspecified hypothyroidism   . Unspecified menopausal and postmenopausal disorder   . Urinary incontinence without sensory awareness   . Vitamin B deficiency     Surgical History: Past Surgical History:  Procedure Laterality Date  . BREAST BIOPSY Left 2015   invasive mammary carcinoma  . BREAST LUMPECTOMY  Left 2015   invasive mammary carcinoma clear margins but close  . BREAST SURGERY    . CATARACT EXTRACTION W/PHACO Right 04/05/2016   Procedure: CATARACT EXTRACTION PHACO AND INTRAOCULAR LENS PLACEMENT (IOC);  Surgeon: Galen Manila, MD;  Location: [facility] ORS;  Service: Ophthalmology;  Laterality: Right;  [place] 01:20 AP% 23.9 CDE 19.29 fluid pack lot # [id number]  . CATARACT EXTRACTION W/PHACO Left 04/26/2016   Procedure: CATARACT EXTRACTION PHACO AND INTRAOCULAR LENS PLACEMENT (IOC);  Surgeon: Galen Manila, MD;  Location: [facility] ORS;  Service: Ophthalmology;  Laterality: Left;   [place] 00:59 AP% 23.2 CDE 13.83 Fluid pack lot # [id number]  . DIAGNOSTIC LAPAROSCOPY     . ESOPHAGOGASTRODUODENOSCOPY N/A 07/19/2015   Procedure: ESOPHAGOGASTRODUODENOSCOPY (EGD);  Surgeon: Wallace Cullens, MD;  Location: [facility] ENDOSCOPY;  Service: Gastroenterology;  Laterality: N/A;  . ESOPHAGOGASTRODUODENOSCOPY (EGD) WITH PROPOFOL N/A 03/28/2015   Procedure: ESOPHAGOGASTRODUODENOSCOPY (EGD) WITH PROPOFOL;  Surgeon: Earline Mayotte, MD;  Location: [facility] ENDOSCOPY;  Service: Endoscopy;  Laterality: N/A;  . ESOPHAGOGASTRODUODENOSCOPY (EGD) WITH PROPOFOL N/A 06/10/2015   Procedure: ESOPHAGOGASTRODUODENOSCOPY (EGD) WITH PROPOFOL;  Surgeon: Christena Deem, MD;  Location: [facility] ENDOSCOPY;  Service: Endoscopy;  Laterality: N/A;  . ESOPHAGOGASTRODUODENOSCOPY (EGD) WITH PROPOFOL N/A 10/20/2015   Procedure: ESOPHAGOGASTRODUODENOSCOPY (EGD) WITH PROPOFOL;  Surgeon: Christena Deem, MD;  Location: [facility] ENDOSCOPY;  Service: Endoscopy;  Laterality: N/A;  . ESOPHAGOGASTRODUODENOSCOPY (EGD) WITH PROPOFOL N/A 02/23/2016   Procedure: ESOPHAGOGASTRODUODENOSCOPY (EGD) WITH PROPOFOL;  Surgeon: Christena Deem, MD;  Location: [facility] ENDOSCOPY;  Service: Endoscopy;  Laterality: N/A;  . ESOPHAGOGASTRODUODENOSCOPY (EGD) WITH PROPOFOL N/A 06/30/2016   Procedure: ESOPHAGOGASTRODUODENOSCOPY (EGD) WITH PROPOFOL;  Surgeon: Christena Deem, MD;  Location: [facility] ENDOSCOPY;  Service: Endoscopy;  Laterality: N/A;  . ESOPHAGOGASTRODUODENOSCOPY (EGD) WITH PROPOFOL N/A 01/05/2017   Procedure: ESOPHAGOGASTRODUODENOSCOPY (EGD) WITH PROPOFOL;  Surgeon: Christena Deem, MD;  Location: [facility] ENDOSCOPY;  Service: Endoscopy;  Laterality: N/A;  . KYPHOPLASTY N/A 02/10/2015   Procedure: [id number];  Surgeon: Kennedy Bucker, MD;  Location: [facility] ORS;  Service: Orthopedics;  Laterality: N/A;  . LAPAROTOMY N/A 03/11/2015   Procedure: REDUCTION OF GASTRIC VOLVULUS, SPLEENECTOMY, GASTRIC TUBE PLACEMENT;  Surgeon: Earline Mayotte, MD;  Location: [facility] ORS;  Service: General;  Laterality: N/A;  . MOHS SURGERY    . PERIPHERAL VASCULAR  CATHETERIZATION N/A 06/12/2015   Procedure: IVC Filter Insertion;  Surgeon: Annice Needy, MD;  Location: [facility] INVASIVE CV LAB;  Service: Cardiovascular;  Laterality: N/A;  . PERIPHERAL VASCULAR CATHETERIZATION N/A 08/23/2016   Procedure: IVC Filter Removal;  Surgeon: Renford Dills, MD;  Location: [facility] INVASIVE CV LAB;  Service: Cardiovascular;  Laterality: N/A;  . STOMACH SURGERY     for volvolus    Home Medications:  Allergies as of 11/20/2017      Reactions   Tramadol Other (See Comments), Rash   Rash   Amoxicillin Other (See Comments)   Reaction:  Stomach cramps    Cefuroxime Itching   Contrast Media [iodinated Diagnostic Agents] Hives   Metrizamide Hives   Morphine And Related    Reaction: face flushed    Propoxyphene    Stomach cramp   Cephalexin Rash   Sulfa Antibiotics Rash   REACTION: Rash   Sulfonamide Derivatives Rash   Tramadol Hcl Rash   Venlafaxine Rash      Medication List        Accurate as of 11/20/17  3:39 PM. Always use your most recent med list.  ADCIRCA 20 MG tablet Generic drug:  tadalafil (PAH) Take 40 mg by mouth daily.   Calcium Carbonate-Vitamin D 600-400 MG-UNIT tablet Take 1 tablet by mouth daily.   cyanocobalamin 1000 MCG/ML injection Commonly known as:  (VITAMIN B-12) Inject 1 mL (1,000 mcg total) into the muscle every 30 (thirty) days. Pt uses on the 1st of every month.   fluticasone 50 MCG/ACT nasal spray Commonly known as:  FLONASE fluticasone propionate 50 mcg/actuation nasal spray,suspension   hydroxychloroquine 200 MG tablet Commonly known as:  PLAQUENIL Take 200 mg by mouth daily.   Lactulose 20 GM/30ML Soln 30 ml every 4 hours until constipation is relieved   letrozole 2.5 MG tablet Commonly known as:  FEMARA Take 1 tablet (2.5 mg total) by mouth daily.   levothyroxine 150 MCG tablet Commonly known as:  SYNTHROID, LEVOTHROID Take 1 tablet (150 mcg total) by mouth daily.   linaclotide 145 MCG Caps  capsule Commonly known as:  LINZESS Take 1 capsule (145 mcg total) by mouth daily before breakfast.   lisinopril 5 MG tablet Commonly known as:  PRINIVIL,ZESTRIL TAKE 1 TABLET (5 MG TOTAL) BY MOUTH DAILY.   metoCLOPramide 10 MG tablet Commonly known as:  REGLAN TAKE 1 TABLET (10 MG TOTAL) BY MOUTH 4 (FOUR) TIMES DAILY - BEFORE MEALS AND AT BEDTIME.   metoprolol succinate 25 MG 24 hr tablet Commonly known as:  TOPROL-XL Take 12.5 mg by mouth daily.   mycophenolate 500 MG tablet Commonly known as:  CELLCEPT Take 1,000 mg by mouth 2 (two) times daily.   nystatin cream Commonly known as:  MYCOSTATIN Apply 1 application topically 2 (two) times daily.   ondansetron 4 MG disintegrating tablet Commonly known as:  ZOFRAN-ODT Take 1 tablet (4 mg total) by mouth every 4 (four) hours as needed for nausea or vomiting.   oxyCODONE-acetaminophen 5-325 MG tablet Commonly known as:  PERCOCET/ROXICET Take 1 tablet by mouth every 8 (eight) hours as needed for severe pain.   OXYGEN Inhale 2 mLs into the lungs at bedtime.   pantoprazole 40 MG tablet Commonly known as:  PROTONIX Take 40 mg by mouth 2 (two) times daily.   predniSONE 5 MG tablet Commonly known as:  DELTASONE Take 5 mg by mouth daily.   promethazine 12.5 MG tablet Commonly known as:  PHENERGAN Take 1 tablet (12.5 mg total) by mouth every 8 (eight) hours as needed for nausea or vomiting.   REFRESH OP Place 2 drops into both eyes daily as needed (dry eyes).   sterile water for irrigation 237 mL Give 2.5 mL/hr by tube continuous.   sucralfate 1 GM/10ML suspension Commonly known as:  CARAFATE Take 1 g by mouth 4 (four) times daily. Before meals and nightly   SYRINGE 3CC/25GX1" 25G X 1" 3 ML Misc Use for b12 injections   vitamin C 1000 MG tablet Take 1,000 mg by mouth daily.   warfarin 3 MG tablet Commonly known as:  COUMADIN Take 1 tablet (3 mg total) by mouth daily.   warfarin 4 MG tablet Commonly known as:   COUMADIN Take 1 tablet (4 mg total) by mouth daily.       Allergies:  Allergies  Allergen Reactions  . Tramadol Other (See Comments) and Rash    Rash  . Amoxicillin Other (See Comments)    Reaction:  Stomach cramps   . Cefuroxime Itching  . Contrast Media [Iodinated Diagnostic Agents] Hives  . Metrizamide Hives  . Morphine And Related     Reaction:  face flushed    . Propoxyphene     Stomach cramp  . Cephalexin Rash  . Sulfa Antibiotics Rash    REACTION: Rash  . Sulfonamide Derivatives Rash  . Tramadol Hcl Rash  . Venlafaxine Rash    Family History: Family History  Problem Relation Age of Onset  . Colon cancer Mother   . Alcohol abuse Sister   . Breast cancer Sister 74  . Cancer Brother   . Lung cancer Son     Social History:  reports that she quit smoking about 26 years ago. she has never used smokeless tobacco. She reports that she drinks alcohol. She reports that she does not use drugs.  ROS:                                        Physical Exam: BP 124/75 (BP Location: Right Arm, Patient Position: Sitting, Cuff Size: Normal)   Pulse 90   Ht 5\' 1"  (1.549 m)   Wt 58.9 kg (129 lb 14.4 oz)   BMI 24.54 kg/m   Constitutional:  Alert and oriented, No acute distress. HEENT: [place] AT, moist mucus membranes.  Trachea midline, no masses. Cardiovascular: No clubbing, cyanosis, or edema.  Laboratory Data: Lab Results  Component Value Date   WBC 8.1 05/26/2017   HGB 10.7 (L) 05/26/2017   HCT 32.4 (L) 05/26/2017   MCV 93.3 05/26/2017   PLT 333 05/26/2017    Lab Results  Component Value Date   CREATININE 1.28 (H) 08/08/2017    No results found for: PSA  No results found for: TESTOSTERONE  Lab Results  Component Value Date   HGBA1C 6.2 10/18/2017    Urinalysis    Component Value Date/Time   COLORURINE YELLOW (A) 07/17/2015 1442   APPEARANCEUR Clear 10/30/2017 1300   LABSPEC 1.014 07/17/2015 1442   LABSPEC 1.019 02/23/2013 2135    PHURINE 6.0 07/17/2015 1442   GLUCOSEU Negative 10/30/2017 1300   GLUCOSEU Negative 02/23/2013 2135   GLUCOSEU NEGATIVE 12/24/2012 1454   HGBUR NEGATIVE 07/17/2015 1442   HGBUR trace-intact 03/10/2009 1351   BILIRUBINUR Negative 10/30/2017 1300   BILIRUBINUR Negative 02/23/2013 2135   KETONESUR NEGATIVE 07/17/2015 1442   PROTEINUR Negative 10/30/2017 1300   PROTEINUR NEGATIVE 07/17/2015 1442   UROBILINOGEN 0.2 12/17/2014 1419   UROBILINOGEN 0.2 12/24/2012 1454   NITRITE Negative 10/30/2017 1300   NITRITE NEGATIVE 07/17/2015 1442   LEUKOCYTESUR Trace (A) 10/30/2017 1300   LEUKOCYTESUR Negative 02/23/2013 2135    Pertinent Imaging:    Assessment & Plan: Reassurance given.  I offered a CT scan if she would like.  She did prefer to have the x-ray and it was ordered  1. Urine frequency 2.  Abdominal pressure and pain   - Urinalysis, Complete - ciprofloxacin (CIPRO) tablet 500 mg - lidocaine (XYLOCAINE) 2 % jelly 1 application   No Follow-up on file.  Martina Sinner, MD  [facility] [address] [place], [place] [address] [phone]

## 2017-11-21 LAB — BUN+CREAT
BUN / CREAT RATIO: 15 (ref 12–28)
BUN: 27 mg/dL (ref 8–27)
CREATININE: 1.75 mg/dL — AB (ref 0.57–1.00)
GFR calc Af Amer: 31 mL/min/{1.73_m2} — ABNORMAL LOW (ref >59)
GFR calc non Af Amer: 27 mL/min/{1.73_m2} — ABNORMAL LOW (ref >59)

## 2017-11-23 ENCOUNTER — Telehealth: Payer: Self-pay | Admitting: Internal Medicine

## 2017-11-23 DIAGNOSIS — K439 Ventral hernia without obstruction or gangrene: Secondary | ICD-10-CM

## 2017-11-23 NOTE — Telephone Encounter (Signed)
Copied from [place] [id number]. Topic: Quick Communication - See Telephone Encounter >> Nov 23, 2017  2:48 PM Ivar Drape wrote: CRM for notification. See Telephone encounter for:  11/23/17. Patient would like for the PCP to know that the nystatin cream (MYCOSTATIN) is not working.  Also, the patient would like to see Dr. Herbert Pun at the [facility] for her hernia.

## 2017-11-24 ENCOUNTER — Ambulatory Visit: Payer: Medicare Other | Admitting: Oncology

## 2017-11-24 ENCOUNTER — Other Ambulatory Visit: Payer: Medicare Other

## 2017-11-24 MED ORDER — FLUCONAZOLE 150 MG PO TABS: 150.0000 mg | ORAL_TABLET | Freq: Every day | ORAL | 0 refills | Status: DC

## 2017-11-24 NOTE — Telephone Encounter (Signed)
Referral in progress   trial of fluconazole 159 ng daily x 2 days for candida.  If no resolution needs to be seen

## 2017-11-24 NOTE — Telephone Encounter (Signed)
Spoke with pt and informed her of the medication that Dr. Derrel Nip sent in and the referral that she placed.

## 2017-11-24 NOTE — Telephone Encounter (Signed)
Correction of type 150 mg dose

## 2017-11-24 NOTE — Telephone Encounter (Signed)
Please advise 

## 2017-11-24 NOTE — Telephone Encounter (Signed)
Copied from [place]. Topic: Quick Communication - Rx Refill/Question >> Nov 24, 2017  6:00 PM Neva Seat wrote: Sluconazole interaction w/ Warfarin   [facility] - [facility] about drug interaction  [id number] [address], [place] [address] Phone: [phone] Fax: [phone] Not a 24 hour pharmacy; exact hours not known

## 2017-11-28 ENCOUNTER — Other Ambulatory Visit: Payer: Self-pay

## 2017-11-28 ENCOUNTER — Inpatient Hospital Stay
Admission: EM | Admit: 2017-11-28 | Discharge: 2017-12-02 | DRG: 392 | Disposition: A | Discharge status: Home / Self Care | Payer: Medicare Other | Attending: Internal Medicine | Admitting: Internal Medicine

## 2017-11-28 ENCOUNTER — Emergency Department: Payer: Medicare Other

## 2017-11-28 ENCOUNTER — Encounter: Payer: Self-pay | Admitting: Emergency Medicine

## 2017-11-28 DIAGNOSIS — Z79890 Hormone replacement therapy (postmenopausal): Secondary | ICD-10-CM

## 2017-11-28 DIAGNOSIS — I4891 Unspecified atrial fibrillation: Secondary | ICD-10-CM | POA: Diagnosis present

## 2017-11-28 DIAGNOSIS — R197 Diarrhea, unspecified: Secondary | ICD-10-CM

## 2017-11-28 DIAGNOSIS — N179 Acute kidney failure, unspecified: Secondary | ICD-10-CM | POA: Diagnosis not present

## 2017-11-28 DIAGNOSIS — E860 Dehydration: Secondary | ICD-10-CM | POA: Diagnosis present

## 2017-11-28 DIAGNOSIS — Z923 Personal history of irradiation: Secondary | ICD-10-CM

## 2017-11-28 DIAGNOSIS — R000 Tachycardia, unspecified: Secondary | ICD-10-CM | POA: Diagnosis present

## 2017-11-28 DIAGNOSIS — G8929 Other chronic pain: Secondary | ICD-10-CM | POA: Diagnosis present

## 2017-11-28 DIAGNOSIS — Z7952 Long term (current) use of systemic steroids: Secondary | ICD-10-CM

## 2017-11-28 DIAGNOSIS — Z7901 Long term (current) use of anticoagulants: Secondary | ICD-10-CM

## 2017-11-28 DIAGNOSIS — E539 Vitamin B deficiency, unspecified: Secondary | ICD-10-CM | POA: Diagnosis present

## 2017-11-28 DIAGNOSIS — Z79811 Long term (current) use of aromatase inhibitors: Secondary | ICD-10-CM

## 2017-11-28 DIAGNOSIS — Z803 Family history of malignant neoplasm of breast: Secondary | ICD-10-CM

## 2017-11-28 DIAGNOSIS — K5792 Diverticulitis of intestine, part unspecified, without perforation or abscess without bleeding: Secondary | ICD-10-CM | POA: Diagnosis present

## 2017-11-28 DIAGNOSIS — Z961 Presence of intraocular lens: Secondary | ICD-10-CM | POA: Diagnosis present

## 2017-11-28 DIAGNOSIS — I480 Paroxysmal atrial fibrillation: Secondary | ICD-10-CM | POA: Diagnosis present

## 2017-11-28 DIAGNOSIS — I130 Hypertensive heart and chronic kidney disease with heart failure and stage 1 through stage 4 chronic kidney disease, or unspecified chronic kidney disease: Secondary | ICD-10-CM | POA: Diagnosis present

## 2017-11-28 DIAGNOSIS — M545 Low back pain: Secondary | ICD-10-CM | POA: Diagnosis present

## 2017-11-28 DIAGNOSIS — Z91041 Radiographic dye allergy status: Secondary | ICD-10-CM

## 2017-11-28 DIAGNOSIS — E039 Hypothyroidism, unspecified: Secondary | ICD-10-CM | POA: Diagnosis present

## 2017-11-28 DIAGNOSIS — Z800 Family history of malignant neoplasm of digestive organs: Secondary | ICD-10-CM

## 2017-11-28 DIAGNOSIS — M8580 Other specified disorders of bone density and structure, unspecified site: Secondary | ICD-10-CM | POA: Diagnosis present

## 2017-11-28 DIAGNOSIS — M329 Systemic lupus erythematosus, unspecified: Secondary | ICD-10-CM | POA: Diagnosis present

## 2017-11-28 DIAGNOSIS — Z86010 Personal history of colonic polyps: Secondary | ICD-10-CM

## 2017-11-28 DIAGNOSIS — Z811 Family history of alcohol abuse and dependence: Secondary | ICD-10-CM

## 2017-11-28 DIAGNOSIS — Z882 Allergy status to sulfonamides status: Secondary | ICD-10-CM

## 2017-11-28 DIAGNOSIS — K219 Gastro-esophageal reflux disease without esophagitis: Secondary | ICD-10-CM | POA: Diagnosis present

## 2017-11-28 DIAGNOSIS — N183 Chronic kidney disease, stage 3 (moderate): Secondary | ICD-10-CM | POA: Diagnosis not present

## 2017-11-28 DIAGNOSIS — Z79891 Long term (current) use of opiate analgesic: Secondary | ICD-10-CM

## 2017-11-28 DIAGNOSIS — R112 Nausea with vomiting, unspecified: Secondary | ICD-10-CM

## 2017-11-28 DIAGNOSIS — Z888 Allergy status to other drugs, medicaments and biological substances status: Secondary | ICD-10-CM

## 2017-11-28 DIAGNOSIS — Z86718 Personal history of other venous thrombosis and embolism: Secondary | ICD-10-CM

## 2017-11-28 DIAGNOSIS — K5732 Diverticulitis of large intestine without perforation or abscess without bleeding: Principal | ICD-10-CM

## 2017-11-28 DIAGNOSIS — I272 Pulmonary hypertension, unspecified: Secondary | ICD-10-CM | POA: Diagnosis present

## 2017-11-28 DIAGNOSIS — I5032 Chronic diastolic (congestive) heart failure: Secondary | ICD-10-CM | POA: Diagnosis present

## 2017-11-28 DIAGNOSIS — N390 Urinary tract infection, site not specified: Secondary | ICD-10-CM | POA: Diagnosis not present

## 2017-11-28 DIAGNOSIS — K5909 Other constipation: Secondary | ICD-10-CM | POA: Diagnosis present

## 2017-11-28 DIAGNOSIS — Z8701 Personal history of pneumonia (recurrent): Secondary | ICD-10-CM

## 2017-11-28 DIAGNOSIS — E785 Hyperlipidemia, unspecified: Secondary | ICD-10-CM | POA: Diagnosis present

## 2017-11-28 DIAGNOSIS — Z87891 Personal history of nicotine dependence: Secondary | ICD-10-CM

## 2017-11-28 DIAGNOSIS — Z881 Allergy status to other antibiotic agents status: Secondary | ICD-10-CM

## 2017-11-28 DIAGNOSIS — Z9842 Cataract extraction status, left eye: Secondary | ICD-10-CM

## 2017-11-28 DIAGNOSIS — Z853 Personal history of malignant neoplasm of breast: Secondary | ICD-10-CM

## 2017-11-28 DIAGNOSIS — Z801 Family history of malignant neoplasm of trachea, bronchus and lung: Secondary | ICD-10-CM

## 2017-11-28 DIAGNOSIS — Z9841 Cataract extraction status, right eye: Secondary | ICD-10-CM

## 2017-11-28 DIAGNOSIS — Z9981 Dependence on supplemental oxygen: Secondary | ICD-10-CM

## 2017-11-28 LAB — CBC
HCT: 35.5 % (ref 35.0–47.0)
HEMOGLOBIN: 11.7 g/dL — AB (ref 12.0–16.0)
MCH: 32.6 pg (ref 26.0–34.0)
MCHC: 32.9 g/dL (ref 32.0–36.0)
MCV: 98.9 fL (ref 80.0–100.0)
Platelets: 349 10*3/uL (ref 150–440)
RBC: 3.59 MIL/uL — AB (ref 3.80–5.20)
RDW: 15.3 % — ABNORMAL HIGH (ref 11.5–14.5)
WBC: 6.4 10*3/uL (ref 3.6–11.0)

## 2017-11-28 LAB — URINALYSIS, ROUTINE W REFLEX MICROSCOPIC
BILIRUBIN URINE: NEGATIVE
Bacteria, UA: NONE SEEN
Glucose, UA: NEGATIVE mg/dL
HGB URINE DIPSTICK: NEGATIVE
Ketones, ur: NEGATIVE mg/dL
Nitrite: NEGATIVE
PH: 5 (ref 5.0–8.0)
Protein, ur: NEGATIVE mg/dL
SPECIFIC GRAVITY, URINE: 1.014 (ref 1.005–1.030)

## 2017-11-28 MED ORDER — CIPROFLOXACIN HCL 500 MG PO TABS: 500.0000 mg | ORAL_TABLET | Freq: Two times a day (BID) | ORAL | 0 refills | Status: DC

## 2017-11-28 MED ORDER — ONDANSETRON HCL 4 MG/2ML IJ SOLN
4.0000 mg | Freq: Once | INTRAMUSCULAR | Status: AC
  Administered 2017-11-28: 4 mg via INTRAVENOUS
  Filled 2017-11-28: qty 2

## 2017-11-28 MED ORDER — SODIUM CHLORIDE 0.9 % IV BOLUS (SEPSIS)
1000.0000 mL | Freq: Once | INTRAVENOUS | Status: AC
  Administered 2017-11-28: 1000 mL via INTRAVENOUS

## 2017-11-28 MED ORDER — METRONIDAZOLE 500 MG PO TABS
500.0000 mg | ORAL_TABLET | Freq: Once | ORAL | Status: AC
  Administered 2017-11-29: 500 mg via ORAL
  Filled 2017-11-28: qty 1

## 2017-11-28 MED ORDER — CIPROFLOXACIN HCL 500 MG PO TABS
500.0000 mg | ORAL_TABLET | Freq: Once | ORAL | Status: AC
  Administered 2017-11-29: 500 mg via ORAL
  Filled 2017-11-28: qty 1

## 2017-11-28 MED ORDER — METRONIDAZOLE 500 MG PO TABS: 500.0000 mg | ORAL_TABLET | Freq: Three times a day (TID) | ORAL | 0 refills | Status: DC

## 2017-11-28 NOTE — ED Provider Notes (Signed)
[facility] Emergency Department Provider Note  Time seen: 8:23 PM  I have reviewed the triage vital signs and the nursing notes.   HISTORY  Chief Complaint Weakness    HPI Diana [name] is a 83 y.o. female with a past medical history of anemia, CHF, hypertension, hyperlipidemia, lupus, presents to the emergency department for abdominal discomfort nausea vomiting and diarrhea.  According to the patient she is been experiencing nausea and vomiting since around 3:00 today with around 10 episodes of very loose watery diarrhea.  Denies any black or bloody stool or vomit.  Denies any known fever.  States fairly diffuse abdominal discomfort.  Denies any known fever, cough or congestion.  Scabbed abdominal discomfort is mild currently.    Past Medical History:  Diagnosis Date  . Acute glomerulonephritis with other specified pathological lesion in kidney in disease classified elsewhere(580.81)   . Alveolar aeration decreased   . Anemia   . Arthritis   . Atrial fibrillation ([place])   . Breast cancer ([place]) 06/03/2014   positive, radiation  . Cancer (HCC)    Breast  . CHF (congestive heart failure) ([place])   . Diaphragmatic hernia   . DVT (deep venous thrombosis) ([place])   . Dysrhythmia   . Environmental allergies   . Esophageal reflux   . Feeding problem    FEEDING TUBE  . Hiatal hernia   . Hyperlipidemia   . Hypertension   . Lower extremity edema   . Lupus (systemic lupus erythematosus) ([place])   . Neuropathy   . Osteopenia   . Oxygen deficiency    USES HS  . Peripheral neuropathy, hereditary/idiopathic   . Personal history of radiation therapy   . Pneumonia   . Primary pulmonary hypertension ([place])   . Pulmonary hypertension ([place])   . Renal insufficiency   . Sciatica   . Shingles   . Shingles (herpes zoster) polyneuropathy March 2013   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  . Shortness of breath dyspnea   . Unspecified hypothyroidism   . Unspecified  menopausal and postmenopausal disorder   . Urinary incontinence without sensory awareness   . Vitamin B deficiency     Patient Active Problem List   Diagnosis Date Noted  . Ventral hernia without obstruction or gangrene 11/09/2017  . Candidiasis of skin 11/09/2017  . Anticoagulation goal of INR 2 to 3 09/14/2016  . Acquired asplenia 08/21/2016  . Sjogrens syndrome ([place]) 08/21/2016  . Long term systemic steroid user 08/17/2016  . Facet arthritis of lumbosacral region ([place]) 12/16/2015  . Concussion 08/17/2015  . H. pylori infection 08/17/2015  . H/O adenomatous polyp of colon 08/17/2015  . Hypertension 08/17/2015  . Hyperlipidemia, unspecified 08/17/2015  . Absolute anemia 07/17/2015  . Non-traumatic compression fracture of L2 lumbar vertebra ([place]) 07/03/2015  . Generalized weakness 07/03/2015  . Rectal bleeding 06/08/2015  . Esophagitis, CMV ([place]) 05/26/2015  . Truncal muscle weakness 04/08/2015  . History of fall within past 90 days 04/07/2015  . Protein-calorie malnutrition ([place]) 04/06/2015  . Erosive esophagitis 03/28/2015  . Gastric volvulus 03/24/2015  . Nausea with vomiting 03/24/2015  . Constipation 12/21/2014  . Dyspnea 11/13/2014  . Shingles 11/10/2014  . Venous (peripheral) insufficiency 06/13/2014  . Breast cancer of upper-outer quadrant of left female breast ([place]) 05/26/2014  . Malignant neoplasm of upper-outer quadrant of female breast ([place]) 05/26/2014  . Squamous cell carcinoma of skin of upper limb, including shoulder 02/18/2014  . Urinary incontinence 01/15/2014  . Other specified disorders of  bladder 01/02/2014  . Other and unspecified hyperlipidemia 08/21/2013  . Familial multiple lipoprotein-type hyperlipidemia 08/21/2013  . History of recurrent pneumonia 05/26/2013  . Pulmonary hypertensive venous disease ([place]) 04/23/2013  . Diastolic dysfunction with heart failure ([place]) 04/23/2013  . Diastolic heart failure ([place]) 04/23/2013  . Long term current use of  anticoagulant therapy 03/27/2013  . Incomplete bladder emptying 03/06/2013  . Acute thromboembolism of deep veins of lower extremity ([place]) 01/07/2013  . Chronic venous embolism and thrombosis of deep vessels of lower extremity ([place]) 01/07/2013  . Hernia, hiatal 01/06/2013  . Renal mass, left 01/06/2013  . Diaphragmatic hernia 01/06/2013  . Disorder of kidney and ureter 01/06/2013  . Left leg DVT ([place]) 12/18/2012  . Sciatica 11/20/2012  . Other postherpetic nervous system involvement 11/20/2012  . Herpes zoster with nervous system complications 93/81/0175  . Microscopic hematuria 11/14/2012  . Disseminated zoster 10/31/2012  . Herpes zoster complications 07/13/8526  . Insomnia 11/08/2011  . Lupus erythematosus 06/13/2008  . NEUROPATHY-PERIPHERAL 10/06/2006  . Hereditary and idiopathic peripheral neuropathy 10/06/2006  . Hypothyroidism 09/25/2006  . ANEMIA, B12 DEFICIENCY 09/25/2006  . GERD 09/25/2006  . Lung involvement in systemic lupus erythematosus ([place]) 09/25/2006  . Type 2 diabetes mellitus without complications ([place]) 78/24/2353  . SHINGLES, HX OF 09/25/2006  . TOBACCO ABUSE, HX OF 09/25/2006  . Personal history of endocrine, metabolic or immunity disorder 09/25/2006  . History of disease 09/25/2006    Past Surgical History:  Procedure Laterality Date  . BREAST BIOPSY Left 2015   invasive mammary carcinoma  . BREAST LUMPECTOMY Left 2015   invasive mammary carcinoma clear margins but close  . BREAST SURGERY    . CATARACT EXTRACTION W/PHACO Right 04/05/2016   Procedure: CATARACT EXTRACTION PHACO AND INTRAOCULAR LENS PLACEMENT (IOC);  Surgeon: Birder Robson, MD;  Location: [facility] ORS;  Service: Ophthalmology;  Laterality: Right;  [place] 01:20 AP% 23.9 CDE 19.29 fluid pack lot # [id number]  . CATARACT EXTRACTION W/PHACO Left 04/26/2016   Procedure: CATARACT EXTRACTION PHACO AND INTRAOCULAR LENS PLACEMENT ([place]);  Surgeon: Birder Robson, MD;  Location: [facility] ORS;  Service:  Ophthalmology;  Laterality: Left;   [place] 00:59 AP% 23.2 CDE 13.83 Fluid pack lot # [id number]  . DIAGNOSTIC LAPAROSCOPY    . ESOPHAGOGASTRODUODENOSCOPY N/A 07/19/2015   Procedure: ESOPHAGOGASTRODUODENOSCOPY (EGD);  Surgeon: Hulen Luster, MD;  Location: [facility] ENDOSCOPY;  Service: Gastroenterology;  Laterality: N/A;  . ESOPHAGOGASTRODUODENOSCOPY (EGD) WITH PROPOFOL N/A 03/28/2015   Procedure: ESOPHAGOGASTRODUODENOSCOPY (EGD) WITH PROPOFOL;  Surgeon: Robert Bellow, MD;  Location: [facility] ENDOSCOPY;  Service: Endoscopy;  Laterality: N/A;  . ESOPHAGOGASTRODUODENOSCOPY (EGD) WITH PROPOFOL N/A 06/10/2015   Procedure: ESOPHAGOGASTRODUODENOSCOPY (EGD) WITH PROPOFOL;  Surgeon: Lollie Sails, MD;  Location: [facility] ENDOSCOPY;  Service: Endoscopy;  Laterality: N/A;  . ESOPHAGOGASTRODUODENOSCOPY (EGD) WITH PROPOFOL N/A 10/20/2015   Procedure: ESOPHAGOGASTRODUODENOSCOPY (EGD) WITH PROPOFOL;  Surgeon: Lollie Sails, MD;  Location: [facility] ENDOSCOPY;  Service: Endoscopy;  Laterality: N/A;  . ESOPHAGOGASTRODUODENOSCOPY (EGD) WITH PROPOFOL N/A 02/23/2016   Procedure: ESOPHAGOGASTRODUODENOSCOPY (EGD) WITH PROPOFOL;  Surgeon: Lollie Sails, MD;  Location: [facility] ENDOSCOPY;  Service: Endoscopy;  Laterality: N/A;  . ESOPHAGOGASTRODUODENOSCOPY (EGD) WITH PROPOFOL N/A 06/30/2016   Procedure: ESOPHAGOGASTRODUODENOSCOPY (EGD) WITH PROPOFOL;  Surgeon: Lollie Sails, MD;  Location: [facility] ENDOSCOPY;  Service: Endoscopy;  Laterality: N/A;  . ESOPHAGOGASTRODUODENOSCOPY (EGD) WITH PROPOFOL N/A 01/05/2017   Procedure: ESOPHAGOGASTRODUODENOSCOPY (EGD) WITH PROPOFOL;  Surgeon: Lollie Sails, MD;  Location: [facility] ENDOSCOPY;  Service: Endoscopy;  Laterality: N/A;  . KYPHOPLASTY N/A  02/10/2015   Procedure: [id number]/G6;  Surgeon: Hessie Knows, MD;  Location: [facility] ORS;  Service: Orthopedics;  Laterality: N/A;  . LAPAROTOMY N/A 03/11/2015   Procedure: REDUCTION OF GASTRIC VOLVULUS, SPLEENECTOMY, GASTRIC TUBE PLACEMENT;  Surgeon: Robert Bellow, MD;  Location: [facility] ORS;  Service: General;  Laterality: N/A;  . MOHS SURGERY    . PERIPHERAL VASCULAR CATHETERIZATION N/A 06/12/2015   Procedure: IVC Filter Insertion;  Surgeon: Algernon Huxley, MD;  Location: [facility] CV LAB;  Service: Cardiovascular;  Laterality: N/A;  . PERIPHERAL VASCULAR CATHETERIZATION N/A 08/23/2016   Procedure: IVC Filter Removal;  Surgeon: Katha Cabal, MD;  Location: [place] CV LAB;  Service: Cardiovascular;  Laterality: N/A;  . STOMACH SURGERY     for volvolus    Prior to Admission medications   Medication Sig Start Date End Date Taking? Authorizing Provider  Ascorbic Acid (VITAMIN C) 1000 MG tablet Take 1,000 mg by mouth daily.    [provider]  Calcium Carbonate-Vitamin D 600-400 MG-UNIT tablet Take 1 tablet by mouth daily.    [provider]  cyanocobalamin (,VITAMIN B-12,) 1000 MCG/ML injection Inject 1 mL (1,000 mcg total) into the muscle every 30 (thirty) days. Pt uses on the 1st of every month. 12/28/16   Crecencio Mc, MD  fluconazole (DIFLUCAN) 150 MG tablet Take 1 tablet (150 mg total) by mouth daily. 11/24/17   Crecencio Mc, MD  fluticasone (FLONASE) 50 MCG/ACT nasal spray fluticasone propionate 50 mcg/actuation nasal spray,suspension    [provider]  hydroxychloroquine (PLAQUENIL) 200 MG tablet Take 200 mg by mouth daily.    [provider]  Lactulose 20 GM/30ML SOLN 30 ml every 4 hours until constipation is relieved 11/08/17   Crecencio Mc, MD  letrozole [facility]) 2.5 MG tablet Take 1 tablet (2.5 mg total) by mouth daily. 11/13/17   Sindy Guadeloupe, MD  levothyroxine (SYNTHROID, LEVOTHROID) 150 MCG tablet Take 1 tablet (150 mcg total) by mouth daily. 05/17/17   Crecencio Mc, MD  linaclotide Rolan Lipa) 145 MCG CAPS capsule Take 1 capsule (145 mcg total) by mouth daily before breakfast. 11/08/17   Crecencio Mc, MD  lisinopril (PRINIVIL,ZESTRIL) 5 MG tablet TAKE 1 TABLET (5 MG TOTAL) BY  MOUTH DAILY. 05/30/17   Crecencio Mc, MD  metoCLOPramide (REGLAN) 10 MG tablet TAKE 1 TABLET (10 MG TOTAL) BY MOUTH 4 (FOUR) TIMES DAILY - BEFORE MEALS AND AT BEDTIME. 05/26/17   Crecencio Mc, MD  metoprolol succinate (TOPROL-XL) 25 MG 24 hr tablet Take 12.5 mg by mouth daily.     [provider]  mycophenolate (CELLCEPT) 500 MG tablet Take 1,000 mg by mouth 2 (two) times daily.    [provider]  nystatin cream (MYCOSTATIN) Apply 1 application topically 2 (two) times daily. 11/08/17   Crecencio Mc, MD  ondansetron (ZOFRAN-ODT) 4 MG disintegrating tablet Take 1 tablet (4 mg total) by mouth every 4 (four) hours as needed for nausea or vomiting. 06/21/17   Crecencio Mc, MD  oxyCODONE-acetaminophen (PERCOCET/ROXICET) 5-325 MG tablet Take 1 tablet by mouth every 8 (eight) hours as needed for severe pain. 11/08/17   Crecencio Mc, MD  OXYGEN Inhale 2 mLs into the lungs at bedtime.    [provider]  pantoprazole (PROTONIX) 40 MG tablet Take 40 mg by mouth 2 (two) times daily.    [provider]  Polyvinyl Alcohol-Povidone (REFRESH OP) Place 2 drops into both eyes daily as needed (dry  eyes).     [provider]  predniSONE (DELTASONE) 5 MG tablet Take 5 mg by mouth daily.     [provider]  promethazine (PHENERGAN) 12.5 MG tablet Take 1 tablet (12.5 mg total) by mouth every 8 (eight) hours as needed for nausea or vomiting. 06/21/17   Crecencio Mc, MD  sterile water for irrigation 237 mL Give 2.5 mL/hr by tube continuous.    [provider]  sucralfate (CARAFATE) 1 GM/10ML suspension Take 1 g by mouth 4 (four) times daily. Before meals and nightly    [provider]  Syringe/Needle, Disp, (SYRINGE 3CC/25GX1") 25G X 1" 3 ML MISC Use for b12 injections 12/28/16   Crecencio Mc, MD  Tadalafil, PAH, (ADCIRCA) 20 MG TABS Take 40 mg by mouth daily.    [provider]  warfarin (COUMADIN) 3 MG tablet Take 1 tablet (3 mg  total) by mouth daily. 11/08/17   Crecencio Mc, MD  warfarin (COUMADIN) 4 MG tablet Take 1 tablet (4 mg total) by mouth daily. 11/08/17   Crecencio Mc, MD    Allergies  Allergen Reactions  . Tramadol Other (See Comments) and Rash    Rash  . Amoxicillin Other (See Comments)    Reaction:  Stomach cramps   . Cefuroxime Itching  . Contrast Media [Iodinated Diagnostic Agents] Hives  . Metrizamide Hives  . Morphine And Related     Reaction: face flushed    . Propoxyphene     Stomach cramp  . Cephalexin Rash  . Sulfa Antibiotics Rash    REACTION: Rash  . Sulfonamide Derivatives Rash  . Tramadol Hcl Rash  . Venlafaxine Rash    Family History  Problem Relation Age of Onset  . Colon cancer Mother   . Alcohol abuse Sister   . Breast cancer Sister 79  . Cancer Brother   . Lung cancer Son     Social History Social History   Tobacco Use  . Smoking status: Former Smoker    Last attempt to quit: 05/25/1991    Years since quitting: 26.5  . Smokeless tobacco: Never Used  Substance Use Topics  . Alcohol use: Yes    Comment: rare  . Drug use: No    Review of Systems Constitutional: Negative for fever. Eyes: Negative for visual complaints ENT: Negative for recent illness/congestion Cardiovascular: Negative for chest pain. Respiratory: Negative for shortness of breath. Gastrointestinal: Generalized abdominal discomfort.  Positive for nausea vomiting diarrhea. Genitourinary: States mild pressure when urinating. Musculoskeletal: Negative for musculoskeletal complaints Skin: Negative for skin complaints  Neurological: Negative for headache All other ROS negative  ____________________________________________   PHYSICAL EXAM:  Constitutional: Alert and oriented. Well appearing and in no distress. Eyes: Normal exam ENT   Head: Normocephalic and atraumatic.   Mouth/Throat: Mucous membranes are moist. Cardiovascular: Normal rate, regular rhythm. No  murmur Respiratory: Normal respiratory effort without tachypnea nor retractions. Breath sounds are clear Gastrointestinal: Soft, mild diffuse tenderness to palpation.  No focal area of tenderness identified.  No rebound or guarding.  No distention.  Hyperactive bowel sounds. Musculoskeletal: Nontender with normal range of motion in all extremities. Neurologic:  Normal speech and language. No gross focal neurologic deficits  Skin:  Skin is warm, dry and intact.  Psychiatric: Mood and affect are normal  ____________________________________________   RADIOLOGY  CT consistent with sigmoid diverticulitis  EKG reviewed and interpreted by myself shows atrial fibrillation versus sinus tachycardia at 133 bpm with a narrow QRS, normal  axis, normal intervals nonspecific ST changes without ST elevation. ____________________________________________   INITIAL IMPRESSION / ASSESSMENT AND PLAN / ED COURSE  Pertinent labs & imaging results that were available during my care of the patient were reviewed by me and considered in my medical decision making (see chart for details).  Presents emergency department for nausea vomiting diarrhea and abdominal discomfort.  Mild diffuse abdominal tenderness.  Differential is quite broad but include gastroenteritis, viral gastroenteritis such as neuroma rotavirus, partial small bowel obstruction, intra-abdominal pathology such as colitis, diverticulitis, pancreatitis or gallbladder disease.  Given the patient's mild diffuse tenderness, we will obtain a CT scan of the abdomen/pelvis to further evaluate.  We will check labs including stool cultures, blood work and urine.  We will treat with Zofran IV fluids continue to closely monitor.  Patient agreeable this plan of care.  CT is consistent with sigmoid diverticulitis.  Patient states her pain is much better.  White blood cell count of 6.4.  The other labs are pending at this time.  I discussed with the patient if her  other labs were normal which she prefer admission to the hospital or discharged home.  Patient strongly prefers to be discharged home if possible.  We will dose Augmentin in the emergency department.  If the rest of the patient's labs result within normal limits we will discharge with Cipro/Flagyl (PCN allergy) and PCP follow-up.  Patient does take Coumadin I discussed with the patient the need to follow-up with her doctor for recheck of her INR in 3 days.  ____________________________________________   FINAL CLINICAL IMPRESSION(S) / ED DIAGNOSES  Nausea vomiting diarrhea Abdominal pain Acute diverticulitis   Harvest Dark, MD 11/28/17 [id number]

## 2017-11-28 NOTE — ED Triage Notes (Signed)
Pt arrives via [facility] from home with complaints of weakness, nausea and vomiting x 2-3 days. Pt reports vomiting "at least 17 times" today and has had diarrhea x10 times today. Pt has hx stomach surgery from "stomache flipping over" about 2 years ago.

## 2017-11-28 NOTE — ED Notes (Signed)
ED Provider at bedside. 

## 2017-11-28 NOTE — Discharge Instructions (Signed)
As we discussed please follow-up with your doctor in 3 days for recheck/reevaluation as well as recheck of your Coumadin level.  Please take your antibiotics as prescribed.  Return to the emergency department for any fever acute worsening of abdominal pain, or any other symptom personally concerning to yourself.

## 2017-11-28 NOTE — ED Notes (Signed)
Patient transported to CT 

## 2017-11-29 DIAGNOSIS — Z9842 Cataract extraction status, left eye: Secondary | ICD-10-CM | POA: Diagnosis not present

## 2017-11-29 DIAGNOSIS — Z86718 Personal history of other venous thrombosis and embolism: Secondary | ICD-10-CM | POA: Diagnosis not present

## 2017-11-29 DIAGNOSIS — K5732 Diverticulitis of large intestine without perforation or abscess without bleeding: Secondary | ICD-10-CM | POA: Diagnosis not present

## 2017-11-29 DIAGNOSIS — Z881 Allergy status to other antibiotic agents status: Secondary | ICD-10-CM | POA: Diagnosis not present

## 2017-11-29 DIAGNOSIS — K219 Gastro-esophageal reflux disease without esophagitis: Secondary | ICD-10-CM | POA: Diagnosis present

## 2017-11-29 DIAGNOSIS — I130 Hypertensive heart and chronic kidney disease with heart failure and stage 1 through stage 4 chronic kidney disease, or unspecified chronic kidney disease: Secondary | ICD-10-CM | POA: Diagnosis present

## 2017-11-29 DIAGNOSIS — I272 Pulmonary hypertension, unspecified: Secondary | ICD-10-CM | POA: Diagnosis present

## 2017-11-29 DIAGNOSIS — N183 Chronic kidney disease, stage 3 (moderate): Secondary | ICD-10-CM | POA: Diagnosis present

## 2017-11-29 DIAGNOSIS — E539 Vitamin B deficiency, unspecified: Secondary | ICD-10-CM | POA: Diagnosis present

## 2017-11-29 DIAGNOSIS — M8580 Other specified disorders of bone density and structure, unspecified site: Secondary | ICD-10-CM | POA: Diagnosis present

## 2017-11-29 DIAGNOSIS — I480 Paroxysmal atrial fibrillation: Secondary | ICD-10-CM | POA: Diagnosis present

## 2017-11-29 DIAGNOSIS — I5032 Chronic diastolic (congestive) heart failure: Secondary | ICD-10-CM | POA: Diagnosis present

## 2017-11-29 DIAGNOSIS — Z888 Allergy status to other drugs, medicaments and biological substances status: Secondary | ICD-10-CM | POA: Diagnosis not present

## 2017-11-29 DIAGNOSIS — I4891 Unspecified atrial fibrillation: Secondary | ICD-10-CM | POA: Diagnosis present

## 2017-11-29 DIAGNOSIS — E785 Hyperlipidemia, unspecified: Secondary | ICD-10-CM | POA: Diagnosis present

## 2017-11-29 DIAGNOSIS — N390 Urinary tract infection, site not specified: Secondary | ICD-10-CM | POA: Diagnosis present

## 2017-11-29 DIAGNOSIS — M329 Systemic lupus erythematosus, unspecified: Secondary | ICD-10-CM | POA: Diagnosis present

## 2017-11-29 DIAGNOSIS — K5792 Diverticulitis of intestine, part unspecified, without perforation or abscess without bleeding: Secondary | ICD-10-CM | POA: Diagnosis present

## 2017-11-29 DIAGNOSIS — Z882 Allergy status to sulfonamides status: Secondary | ICD-10-CM | POA: Diagnosis not present

## 2017-11-29 DIAGNOSIS — E039 Hypothyroidism, unspecified: Secondary | ICD-10-CM | POA: Diagnosis present

## 2017-11-29 DIAGNOSIS — Z91041 Radiographic dye allergy status: Secondary | ICD-10-CM | POA: Diagnosis not present

## 2017-11-29 DIAGNOSIS — E860 Dehydration: Secondary | ICD-10-CM | POA: Diagnosis present

## 2017-11-29 DIAGNOSIS — I270 Primary pulmonary hypertension: Secondary | ICD-10-CM | POA: Diagnosis not present

## 2017-11-29 DIAGNOSIS — Z853 Personal history of malignant neoplasm of breast: Secondary | ICD-10-CM | POA: Diagnosis not present

## 2017-11-29 DIAGNOSIS — Z923 Personal history of irradiation: Secondary | ICD-10-CM | POA: Diagnosis not present

## 2017-11-29 DIAGNOSIS — N184 Chronic kidney disease, stage 4 (severe): Secondary | ICD-10-CM | POA: Diagnosis not present

## 2017-11-29 DIAGNOSIS — N179 Acute kidney failure, unspecified: Secondary | ICD-10-CM | POA: Diagnosis present

## 2017-11-29 LAB — COMPREHENSIVE METABOLIC PANEL
ALK PHOS: 75 U/L (ref 38–126)
ALT: 10 U/L — AB (ref 14–54)
AST: 27 U/L (ref 15–41)
Albumin: 3.4 g/dL — ABNORMAL LOW (ref 3.5–5.0)
Anion gap: 11 (ref 5–15)
BUN: 30 mg/dL — ABNORMAL HIGH (ref 6–20)
CALCIUM: 9.4 mg/dL (ref 8.9–10.3)
CO2: 20 mmol/L — ABNORMAL LOW (ref 22–32)
CREATININE: 1.81 mg/dL — AB (ref 0.44–1.00)
Chloride: 116 mmol/L — ABNORMAL HIGH (ref 101–111)
GFR, EST AFRICAN AMERICAN: 29 mL/min — AB (ref >60)
GFR, EST NON AFRICAN AMERICAN: 25 mL/min — AB (ref >60)
Glucose, Bld: 159 mg/dL — ABNORMAL HIGH (ref 65–99)
Potassium: 4.9 mmol/L (ref 3.5–5.1)
Sodium: 147 mmol/L — ABNORMAL HIGH (ref 135–145)
Total Bilirubin: 0.6 mg/dL (ref 0.3–1.2)
Total Protein: 7.5 g/dL (ref 6.5–8.1)

## 2017-11-29 LAB — TROPONIN I
TROPONIN I: 0.03 ng/mL — AB (ref <0.03)
Troponin I: 0.05 ng/mL (ref <0.03)
Troponin I: 0.04 ng/mL (ref <0.03)
Troponin I: 0.04 ng/mL (ref <0.03)

## 2017-11-29 LAB — PROTIME-INR
INR: 3.28
Prothrombin Time: 33.1 seconds — ABNORMAL HIGH (ref 11.4–15.2)

## 2017-11-29 LAB — C DIFFICILE QUICK SCREEN W PCR REFLEX
C Diff antigen: NEGATIVE
C Diff interpretation: NOT DETECTED
C Diff toxin: NEGATIVE

## 2017-11-29 LAB — LIPASE, BLOOD: LIPASE: 18 U/L (ref 11–51)

## 2017-11-29 LAB — TSH: TSH: 0.058 u[IU]/mL — ABNORMAL LOW (ref 0.350–4.500)

## 2017-11-29 MED ORDER — ONDANSETRON 4 MG PO TBDP: 4.0000 mg | ORAL_TABLET | Freq: Three times a day (TID) | ORAL | 0 refills | Status: AC | PRN

## 2017-11-29 MED ORDER — KETOROLAC TROMETHAMINE 30 MG/ML IJ SOLN
INTRAMUSCULAR | Status: AC
  Filled 2017-11-29: qty 1

## 2017-11-29 MED ORDER — TADALAFIL (PAH) 20 MG PO TABS
40.0000 mg | ORAL_TABLET | Freq: Every day | ORAL | Status: DC
  Administered 2017-11-29 – 2017-12-01 (×3): 40 mg via ORAL
  Filled 2017-11-29 (×4): qty 2

## 2017-11-29 MED ORDER — ACETAMINOPHEN 325 MG PO TABS: 650.0000 mg | ORAL_TABLET | Freq: Four times a day (QID) | ORAL | Status: DC | PRN

## 2017-11-29 MED ORDER — MYCOPHENOLATE MOFETIL 250 MG PO CAPS
1000.0000 mg | ORAL_CAPSULE | Freq: Two times a day (BID) | ORAL | Status: DC
  Administered 2017-11-29 – 2017-12-01 (×7): 1000 mg via ORAL
  Filled 2017-11-29 (×8): qty 4

## 2017-11-29 MED ORDER — LETROZOLE 2.5 MG PO TABS
2.5000 mg | ORAL_TABLET | Freq: Every day | ORAL | Status: DC
  Administered 2017-11-29 – 2017-12-01 (×3): 2.5 mg via ORAL
  Filled 2017-11-29 (×4): qty 1

## 2017-11-29 MED ORDER — WARFARIN SODIUM 3 MG PO TABS: 3.0000 mg | ORAL_TABLET | ORAL | Status: DC

## 2017-11-29 MED ORDER — PREDNISONE 5 MG PO TABS
5.0000 mg | ORAL_TABLET | Freq: Every day | ORAL | Status: DC
  Administered 2017-11-29 – 2017-12-01 (×3): 5 mg via ORAL
  Filled 2017-11-29 (×4): qty 1

## 2017-11-29 MED ORDER — METRONIDAZOLE 500 MG PO TABS
500.0000 mg | ORAL_TABLET | Freq: Three times a day (TID) | ORAL | Status: DC
  Administered 2017-11-29 – 2017-12-02 (×7): 500 mg via ORAL
  Filled 2017-11-29 (×10): qty 1

## 2017-11-29 MED ORDER — ACETAMINOPHEN 650 MG RE SUPP: 650.0000 mg | Freq: Four times a day (QID) | RECTAL | Status: DC | PRN

## 2017-11-29 MED ORDER — LEVOTHYROXINE SODIUM 50 MCG PO TABS
150.0000 ug | ORAL_TABLET | Freq: Every day | ORAL | Status: DC
  Filled 2017-11-29: qty 1

## 2017-11-29 MED ORDER — METRONIDAZOLE IN NACL 5-0.79 MG/ML-% IV SOLN
500.0000 mg | Freq: Three times a day (TID) | INTRAVENOUS | Status: DC
  Filled 2017-11-29 (×2): qty 100

## 2017-11-29 MED ORDER — SUCRALFATE 1 GM/10ML PO SUSP
1.0000 g | Freq: Four times a day (QID) | ORAL | Status: DC
  Administered 2017-11-29 – 2017-12-01 (×11): 1 g via ORAL
  Filled 2017-11-29 (×11): qty 10

## 2017-11-29 MED ORDER — METOPROLOL SUCCINATE ER 25 MG PO TB24
12.5000 mg | ORAL_TABLET | Freq: Every day | ORAL | Status: DC
  Administered 2017-11-29 – 2017-12-01 (×3): 12.5 mg via ORAL
  Filled 2017-11-29 (×3): qty 1

## 2017-11-29 MED ORDER — METOCLOPRAMIDE HCL 10 MG PO TABS
10.0000 mg | ORAL_TABLET | Freq: Three times a day (TID) | ORAL | Status: DC
  Administered 2017-11-29 – 2017-12-02 (×13): 10 mg via ORAL
  Filled 2017-11-29 (×13): qty 1

## 2017-11-29 MED ORDER — HYDROXYCHLOROQUINE SULFATE 200 MG PO TABS
200.0000 mg | ORAL_TABLET | Freq: Every day | ORAL | Status: DC
  Administered 2017-11-29 – 2017-12-01 (×3): 200 mg via ORAL
  Filled 2017-11-29 (×4): qty 1

## 2017-11-29 MED ORDER — SODIUM CHLORIDE 0.9 % IV SOLN
INTRAVENOUS | Status: DC
  Administered 2017-11-29 – 2017-12-02 (×6): via INTRAVENOUS

## 2017-11-29 MED ORDER — OXYCODONE-ACETAMINOPHEN 5-325 MG PO TABS
1.0000 | ORAL_TABLET | Freq: Three times a day (TID) | ORAL | Status: DC | PRN
  Administered 2017-11-29: 1 via ORAL
  Filled 2017-11-29: qty 1

## 2017-11-29 MED ORDER — CYANOCOBALAMIN 1000 MCG/ML IJ SOLN: 1000.0000 ug | INTRAMUSCULAR | Status: DC

## 2017-11-29 MED ORDER — METRONIDAZOLE IN NACL 5-0.79 MG/ML-% IV SOLN
500.0000 mg | Freq: Three times a day (TID) | INTRAVENOUS | Status: DC
  Filled 2017-11-29: qty 100

## 2017-11-29 MED ORDER — DOCUSATE SODIUM 100 MG PO CAPS
100.0000 mg | ORAL_CAPSULE | Freq: Two times a day (BID) | ORAL | Status: DC
  Administered 2017-11-29 – 2017-11-30 (×3): 100 mg via ORAL
  Filled 2017-11-29 (×5): qty 1

## 2017-11-29 MED ORDER — WARFARIN SODIUM 4 MG PO TABS: 4.0000 mg | ORAL_TABLET | ORAL | Status: DC

## 2017-11-29 MED ORDER — NYSTATIN 100000 UNIT/GM EX CREA
1.0000 "application " | TOPICAL_CREAM | Freq: Two times a day (BID) | CUTANEOUS | Status: DC
  Administered 2017-11-29: 1 via TOPICAL
  Filled 2017-11-29: qty 15

## 2017-11-29 MED ORDER — LINACLOTIDE 145 MCG PO CAPS
145.0000 ug | ORAL_CAPSULE | Freq: Every day | ORAL | Status: DC
  Administered 2017-11-29 – 2017-12-02 (×3): 145 ug via ORAL
  Filled 2017-11-29 (×4): qty 1

## 2017-11-29 MED ORDER — VITAMIN C 500 MG PO TABS
1000.0000 mg | ORAL_TABLET | Freq: Every day | ORAL | Status: DC
  Administered 2017-11-29 – 2017-12-01 (×3): 1000 mg via ORAL
  Filled 2017-11-29 (×5): qty 2

## 2017-11-29 MED ORDER — CALCIUM CARBONATE-VITAMIN D 500-200 MG-UNIT PO TABS
1.0000 | ORAL_TABLET | Freq: Every day | ORAL | Status: DC
  Administered 2017-11-29 – 2017-12-01 (×3): 1 via ORAL
  Filled 2017-11-29 (×4): qty 1

## 2017-11-29 MED ORDER — PANTOPRAZOLE SODIUM 40 MG PO TBEC
40.0000 mg | DELAYED_RELEASE_TABLET | Freq: Two times a day (BID) | ORAL | Status: DC
  Administered 2017-11-29 – 2017-12-01 (×6): 40 mg via ORAL
  Filled 2017-11-29 (×6): qty 1

## 2017-11-29 MED ORDER — OXYCODONE-ACETAMINOPHEN 5-325 MG PO TABS
1.0000 | ORAL_TABLET | Freq: Four times a day (QID) | ORAL | Status: DC | PRN
  Administered 2017-11-29 – 2017-11-30 (×4): 2 via ORAL
  Administered 2017-12-01 (×2): 1 via ORAL
  Filled 2017-11-29: qty 2
  Filled 2017-11-29: qty 1
  Filled 2017-11-29 (×2): qty 2
  Filled 2017-11-29: qty 1
  Filled 2017-11-29: qty 2

## 2017-11-29 MED ORDER — ONDANSETRON HCL 4 MG/2ML IJ SOLN
4.0000 mg | Freq: Four times a day (QID) | INTRAMUSCULAR | Status: DC | PRN
  Administered 2017-11-29 – 2017-11-30 (×2): 4 mg via INTRAVENOUS
  Filled 2017-11-29 (×2): qty 2

## 2017-11-29 MED ORDER — ONDANSETRON HCL 4 MG PO TABS
4.0000 mg | ORAL_TABLET | Freq: Four times a day (QID) | ORAL | Status: DC | PRN
  Administered 2017-12-01: 4 mg via ORAL
  Filled 2017-11-29: qty 1

## 2017-11-29 MED ORDER — CIPROFLOXACIN HCL 500 MG PO TABS
500.0000 mg | ORAL_TABLET | ORAL | Status: DC
  Administered 2017-11-29 – 2017-12-01 (×3): 500 mg via ORAL
  Filled 2017-11-29 (×3): qty 1

## 2017-11-29 MED ORDER — LISINOPRIL 10 MG PO TABS: 5.0000 mg | ORAL_TABLET | Freq: Every day | ORAL | Status: DC

## 2017-11-29 NOTE — Consult Note (Signed)
SURGICAL CONSULTATION NOTE (initial) - cpt: 99254  HISTORY OF PRESENT ILLNESS (HPI):  83 y.o. female presented to [facility] ED for evaluation of nausea and vomiting. Patient reports she developed lower abdominal pain 2 weeks ago, which she attributed to constipation until she became concerned about the possibility of recurrent gastric volvulus when she began experiencing nausea and non-bloody non-bilious vomiting yesterday, for which she presented to [facility] ED for further evaluation. Patient states she's experienced chronic constipation, most recently treated by her primary care physician with prescription for Linzess beginning last week and previously dulcolax, which she says gave her cramps. She is currently +flatus with loose BM's. Patient also previously experienced mild sigmoid colonic diverticulitis 20 years ago, reportedly treated with outpatient oral antibiotics. Patient otherwise reports only mild GERD, denies any fever/chills, CP, or SOB.  Surgery is consulted by medical physician Dr. Benjie Karvonen in this context for evaluation and management of acute sigmoid colonic diverticulitis.  PAST MEDICAL HISTORY (PMH):  Past Medical History:  Diagnosis Date  . Acute glomerulonephritis with other specified pathological lesion in kidney in disease classified elsewhere(580.81)   . Alveolar aeration decreased   . Anemia   . Arthritis   . Atrial fibrillation ([place])   . Breast cancer ([place]) 06/03/2014   positive, radiation  . Cancer (HCC)    Breast  . CHF (congestive heart failure) ([place])   . Diaphragmatic hernia   . DVT (deep venous thrombosis) ([place])   . Dysrhythmia   . Environmental allergies   . Esophageal reflux   . Feeding problem    FEEDING TUBE  . Hiatal hernia   . Hyperlipidemia   . Hypertension   . Lower extremity edema   . Lupus (systemic lupus erythematosus) ([place])   . Neuropathy   . Osteopenia   . Oxygen deficiency    USES HS  . Peripheral neuropathy, hereditary/idiopathic   . Personal  history of radiation therapy   . Pneumonia   . Primary pulmonary hypertension ([place])   . Pulmonary hypertension ([place])   . Renal insufficiency   . Sciatica   . Shingles   . Shingles (herpes zoster) polyneuropathy March 2013   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  . Shortness of breath dyspnea   . Unspecified hypothyroidism   . Unspecified menopausal and postmenopausal disorder   . Urinary incontinence without sensory awareness   . Vitamin B deficiency      PAST SURGICAL HISTORY ([place]):  Past Surgical History:  Procedure Laterality Date  . BREAST BIOPSY Left 2015   invasive mammary carcinoma  . BREAST LUMPECTOMY Left 2015   invasive mammary carcinoma clear margins but close  . BREAST SURGERY    . CATARACT EXTRACTION W/PHACO Right 04/05/2016   Procedure: CATARACT EXTRACTION PHACO AND INTRAOCULAR LENS PLACEMENT (IOC);  Surgeon: Birder Robson, MD;  Location: [facility] ORS;  Service: Ophthalmology;  Laterality: Right;  [place] 01:20 AP% 23.9 CDE 19.29 fluid pack lot # [id number]  . CATARACT EXTRACTION W/PHACO Left 04/26/2016   Procedure: CATARACT EXTRACTION PHACO AND INTRAOCULAR LENS PLACEMENT ([place]);  Surgeon: Birder Robson, MD;  Location: [facility] ORS;  Service: Ophthalmology;  Laterality: Left;   [place] 00:59 AP% 23.2 CDE 13.83 Fluid pack lot # [id number]  . DIAGNOSTIC LAPAROSCOPY    . ESOPHAGOGASTRODUODENOSCOPY N/A 07/19/2015   Procedure: ESOPHAGOGASTRODUODENOSCOPY (EGD);  Surgeon: Hulen Luster, MD;  Location: [facility] ENDOSCOPY;  Service: Gastroenterology;  Laterality: N/A;  . ESOPHAGOGASTRODUODENOSCOPY (EGD) WITH PROPOFOL N/A 03/28/2015   Procedure: ESOPHAGOGASTRODUODENOSCOPY (EGD) WITH PROPOFOL;  Surgeon: Robert Bellow, MD;  Location: [facility] ENDOSCOPY;  Service: Endoscopy;  Laterality: N/A;  . ESOPHAGOGASTRODUODENOSCOPY (EGD) WITH PROPOFOL N/A 06/10/2015   Procedure: ESOPHAGOGASTRODUODENOSCOPY (EGD) WITH PROPOFOL;  Surgeon: Lollie Sails, MD;  Location: [facility] ENDOSCOPY;  Service: Endoscopy;  Laterality:  N/A;  . ESOPHAGOGASTRODUODENOSCOPY (EGD) WITH PROPOFOL N/A 10/20/2015   Procedure: ESOPHAGOGASTRODUODENOSCOPY (EGD) WITH PROPOFOL;  Surgeon: Lollie Sails, MD;  Location: [facility] ENDOSCOPY;  Service: Endoscopy;  Laterality: N/A;  . ESOPHAGOGASTRODUODENOSCOPY (EGD) WITH PROPOFOL N/A 02/23/2016   Procedure: ESOPHAGOGASTRODUODENOSCOPY (EGD) WITH PROPOFOL;  Surgeon: Lollie Sails, MD;  Location: [facility] ENDOSCOPY;  Service: Endoscopy;  Laterality: N/A;  . ESOPHAGOGASTRODUODENOSCOPY (EGD) WITH PROPOFOL N/A 06/30/2016   Procedure: ESOPHAGOGASTRODUODENOSCOPY (EGD) WITH PROPOFOL;  Surgeon: Lollie Sails, MD;  Location: [facility] ENDOSCOPY;  Service: Endoscopy;  Laterality: N/A;  . ESOPHAGOGASTRODUODENOSCOPY (EGD) WITH PROPOFOL N/A 01/05/2017   Procedure: ESOPHAGOGASTRODUODENOSCOPY (EGD) WITH PROPOFOL;  Surgeon: Lollie Sails, MD;  Location: [facility] ENDOSCOPY;  Service: Endoscopy;  Laterality: N/A;  . KYPHOPLASTY N/A 02/10/2015   Procedure: [id number];  Surgeon: Hessie Knows, MD;  Location: [facility] ORS;  Service: Orthopedics;  Laterality: N/A;  . LAPAROTOMY N/A 03/11/2015   Procedure: REDUCTION OF GASTRIC VOLVULUS, SPLEENECTOMY, GASTRIC TUBE PLACEMENT;  Surgeon: Robert Bellow, MD;  Location: [facility] ORS;  Service: General;  Laterality: N/A;  . MOHS SURGERY    . PERIPHERAL VASCULAR CATHETERIZATION N/A 06/12/2015   Procedure: IVC Filter Insertion;  Surgeon: Algernon Huxley, MD;  Location: [place] CV LAB;  Service: Cardiovascular;  Laterality: N/A;  . PERIPHERAL VASCULAR CATHETERIZATION N/A 08/23/2016   Procedure: IVC Filter Removal;  Surgeon: Katha Cabal, MD;  Location: [place] CV LAB;  Service: Cardiovascular;  Laterality: N/A;  . STOMACH SURGERY     for volvolus     MEDICATIONS:  Prior to Admission medications   Medication Sig Start Date End Date Taking? Authorizing Provider  Ascorbic Acid (VITAMIN C) 1000 MG tablet Take 1,000 mg by mouth daily.   Yes [provider]  Calcium  Carbonate-Vitamin D 600-400 MG-UNIT tablet Take 1 tablet by mouth daily.   Yes [provider]  cyanocobalamin (,VITAMIN B-12,) 1000 MCG/ML injection Inject 1 mL (1,000 mcg total) into the muscle every 30 (thirty) days. Pt uses on the 1st of every month. 12/28/16  Yes Crecencio Mc, MD  hydroxychloroquine (PLAQUENIL) 200 MG tablet Take 200 mg by mouth daily.   Yes [provider]  Lactulose 20 GM/30ML SOLN 30 ml every 4 hours until constipation is relieved 11/08/17  Yes Crecencio Mc, MD  letrozole [facility]) 2.5 MG tablet Take 1 tablet (2.5 mg total) by mouth daily. 11/13/17  Yes Sindy Guadeloupe, MD  levothyroxine (SYNTHROID, LEVOTHROID) 150 MCG tablet Take 1 tablet (150 mcg total) by mouth daily. 05/17/17  Yes Crecencio Mc, MD  linaclotide [facility]) 145 MCG CAPS capsule Take 1 capsule (145 mcg total) by mouth daily before breakfast. 11/08/17  Yes Crecencio Mc, MD  lisinopril (PRINIVIL,ZESTRIL) 5 MG tablet TAKE 1 TABLET (5 MG TOTAL) BY MOUTH DAILY. 05/30/17  Yes Crecencio Mc, MD  metoCLOPramide (REGLAN) 10 MG tablet TAKE 1 TABLET (10 MG TOTAL) BY MOUTH 4 (FOUR) TIMES DAILY - BEFORE MEALS AND AT BEDTIME. 05/26/17  Yes Crecencio Mc, MD  metoprolol succinate (TOPROL-XL) 25 MG 24 hr tablet Take 12.5 mg by mouth daily.    Yes [provider]  mycophenolate (CELLCEPT) 500 MG tablet Take 1,000 mg by mouth 2 (two) times daily.   Yes [provider]  nystatin cream (MYCOSTATIN) Apply 1 application topically 2 (two) times daily. 11/08/17  Yes Crecencio Mc, MD  oxyCODONE-acetaminophen (PERCOCET/ROXICET) 5-325 MG tablet Take 1 tablet by mouth every 8 (eight) hours as needed for severe pain. 11/08/17  Yes Crecencio Mc, MD  OXYGEN Inhale 2 mLs into the lungs at bedtime.   Yes [provider]  pantoprazole (PROTONIX) 40 MG tablet Take 40 mg by mouth 2 (two) times daily.   Yes [provider]  Polyvinyl Alcohol-Povidone (REFRESH OP) Place 2 drops into  both eyes daily as needed (dry eyes).    Yes [provider]  predniSONE (DELTASONE) 5 MG tablet Take 5 mg by mouth daily.    Yes [provider]  promethazine (PHENERGAN) 12.5 MG tablet Take 1 tablet (12.5 mg total) by mouth every 8 (eight) hours as needed for nausea or vomiting. 06/21/17  Yes Crecencio Mc, MD  sterile water for irrigation 237 mL Give 2.5 mL/hr by tube continuous.   Yes [provider]  sucralfate (CARAFATE) 1 GM/10ML suspension Take 1 g by mouth 4 (four) times daily. Before meals and nightly   Yes [provider]  Syringe/Needle, Disp, (SYRINGE 3CC/25GX1") 25G X 1" 3 ML MISC Use for b12 injections 12/28/16  Yes Crecencio Mc, MD  Tadalafil, PAH, (ADCIRCA) 20 MG TABS Take 40 mg by mouth daily.   Yes [provider]  warfarin (COUMADIN) 3 MG tablet Take 1 tablet (3 mg total) by mouth daily. Patient taking differently: Take 3 mg by mouth every other day.  11/08/17  Yes Crecencio Mc, MD  warfarin (COUMADIN) 4 MG tablet Take 1 tablet (4 mg total) by mouth daily. Patient taking differently: Take 4 mg by mouth every other day.  11/08/17  Yes Crecencio Mc, MD  ciprofloxacin (CIPRO) 500 MG tablet Take 1 tablet (500 mg total) by mouth 2 (two) times daily for 14 days. 11/28/17 12/12/17  Harvest Dark, MD  metroNIDAZOLE (FLAGYL) 500 MG tablet Take 1 tablet (500 mg total) by mouth 3 (three) times daily for 14 days. 11/28/17 12/12/17  Harvest Dark, MD  ondansetron (ZOFRAN ODT) 4 MG disintegrating tablet Take 1 tablet (4 mg total) by mouth every 8 (eight) hours as needed for nausea or vomiting. 11/29/17   Harvest Dark, MD     ALLERGIES:  Allergies  Allergen Reactions  . Tramadol Other (See Comments) and Rash    Rash  . Amoxicillin Other (See Comments)    Reaction:  Stomach cramps   . Cefuroxime Itching  . Contrast Media [Iodinated Diagnostic Agents] Hives  . Metrizamide Hives  . Morphine And Related     Reaction: face  flushed    . Propoxyphene     Stomach cramp  . Cephalexin Rash  . Sulfa Antibiotics Rash    REACTION: Rash  . Sulfonamide Derivatives Rash  . Tramadol Hcl Rash  . Venlafaxine Rash     SOCIAL HISTORY:  Social History   Socioeconomic History  . Marital status: Divorced    Spouse name: Not on file  . Number of children: Not on file  . Years of education: Not on file  . Highest education level: Not on file  Social Needs  . Financial resource strain: Not hard at all  . Food insecurity - worry: Never true  . Food insecurity - inability: Never true  . Transportation needs - medical: No  . Transportation needs - non-medical: No  Occupational History  . 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 Smoking status: Former Smoker    Last attempt to quit: 05/25/1991    Years since quitting: 26.5  . Smokeless tobacco: Never Used  Substance and Sexual Activity  . Alcohol use: No    Frequency: Never    Comment: rare  . Drug use: No  . Sexual activity: Not on file  Other Topics Concern  . Not on file  Social History Narrative  . Not on file    The patient currently resides (home / rehab facility / nursing home): Home The patient normally is (ambulatory / bedbound): Ambulatory   FAMILY HISTORY:  Family History  Problem Relation Age of Onset  . Colon cancer Mother   . Alcohol abuse Sister   . Breast cancer Sister 39  . Cancer Brother   . Lung cancer Son      REVIEW OF SYSTEMS:  Constitutional: denies weight loss, fever, chills, or sweats  Eyes: denies any other vision changes, history of eye injury  ENT: denies sore throat, hearing problems  Respiratory: denies shortness of breath, wheezing  Cardiovascular: denies chest pain, palpitations  Gastrointestinal: abdominal pain, N/V, and bowel function as per HPI Genitourinary: denies burning with urination or urinary frequency Musculoskeletal: denies any other joint pains or cramps  Skin:  denies any other rashes or skin discolorations  Neurological: denies any other headache, dizziness, weakness  Psychiatric: denies any other depression, anxiety   All other review of systems were negative   VITAL SIGNS:  Temp:  [98.2 F (36.8 C)-98.8 F (37.1 C)] 98.2 F (36.8 C) (03/13 1215) Pulse Rate:  [74-132] 74 (03/13 1215) Resp:  [16-29] 16 (03/13 1215) BP: (98-154)/(58-93) 100/59 (03/13 1215) SpO2:  [91 %-96 %] 91 % (03/13 1215) Weight:  [126 lb 1.6 oz (57.2 kg)-129 lb (58.5 kg)] 126 lb 1.6 oz (57.2 kg) (03/13 0500)     Height: 5\' 1"  (154.9 cm) Weight: 126 lb 1.6 oz (57.2 kg) BMI (Calculated): 23.84   INTAKE/OUTPUT:  This shift: Total I/O In: 856.7 [P.O.:120; I.V.:736.7] Out: 575 [Urine:575]  Last 2 shifts: @IOLAST2SHIFTS @   PHYSICAL EXAM:  Constitutional:  -- Normal body habitus  -- Awake, alert, and oriented x3, no apparent distress Eyes:  -- Pupils equally round and reactive to light  -- No scleral icterus, B/L no occular discharge Ear, nose, throat: -- Neck is FROM WNL -- No jugular venous distension  Pulmonary:  -- No wheezes or rhales -- Equal breath sounds bilaterally -- Breathing non-labored at rest Cardiovascular:  -- S1, S2 present  -- No pericardial rubs  Gastrointestinal:  -- Abdomen soft and non-distended with mild suprapubic and LLQ abdominal tenderness to palpation, no guarding or rebound tenderness -- No abdominal masses appreciated, pulsatile or otherwise  Musculoskeletal and Integumentary:  -- Wounds or skin discoloration: None appreciated -- Extremities: B/L UE and LE FROM, hands and feet warm, no edema  Neurologic:  -- Motor function: Intact and symmetric -- Sensation: Intact and symmetric Psychiatric:  -- Mood and affect WNL  Labs:  CBC Latest Ref Rng & Units 11/28/2017 05/26/2017 05/17/2017  WBC 3.6 - 11.0 K/uL 6.4 8.1 4.9  Hemoglobin 12.0 - 16.0 g/dL 11.7(L) 10.7(L) 10.5(L)  Hematocrit 35.0 - 47.0 % 35.5 32.4(L) 33.0(L)  Platelets 150  - 440 K/uL 349 333 325.0   CMP Latest Ref Rng & Units 11/28/2017 11/20/2017 08/08/2017  Glucose 65 - 99 mg/dL 159(H) - 95  BUN 6 - 20 mg/dL 30(H) 27 24(H)  Creatinine 0.44 - 1.00 mg/dL 1.81(H) 1.75(H) 1.28(H)  Sodium 135 - 145 mmol/L 147(H) - 141  Potassium 3.5 - 5.1 mmol/L 4.9 - 4.2  Chloride 101 - 111 mmol/L 116(H) - 109  CO2 22 - 32 mmol/L 20(L) - 24  Calcium 8.9 - 10.3 mg/dL 9.4 - 9.3  Total Protein 6.5 - 8.1 g/dL 7.5 - -  Total Bilirubin 0.3 - 1.2 mg/dL 0.6 - -  Alkaline Phos 38 - 126 U/L 75 - -  AST 15 - 41 U/L 27 - -  ALT 14 - 54 U/L 10(L) - -   Imaging studies:  CT Abdomen and Pelvis without Contrast (11/28/2017) - personally reviewed and discussed with patient and her daughter Large hiatal hernia with the majority of the stomach in the chest. Severe descending and sigmoid colonic diverticulosis with superimposed short segment of proximal sigmoid bowel wall thickening and pericolonic inflammation. Small and large bowel air-fluid levels. Small bowel are normal in course and caliber without inflammatory changes, sensitivity decreased by lack of enteric contrast. Normal appendix.  Assessment/Plan: (ICD-10's: K65.32) 83 y.o. female with recurrent acute sigmoid colonic diverticulitis without abscess, complicated by pertinent comorbidities including HTN, HLD, CHF, chronic Coumadin anticoagulation for atrial fibrillation, pulmonary HTN, home supplemental oxygen qhs, CKD, hypothyroidism, chronic low-dose steroids for lupus, chronic constipation attributed to chronic narcotics for chronic lower back pain attributed to scoliosis with sciatica, GERD with large hiatal hernia (history of gastric volvulus), osteopenia, peripheral neuropathy, history of DVT and history of breast cancer.   - agree with IV antibiotics  - IV fluids, sips of clear liquids okay for now  - pain control prn (minimize narcotics as able)  - continue to monitor abdominal exam and bowel function  - medical management of  comorbidities as per primary medical team  - discussed possibility of surgery with patient and her family, along with anticipated high risk if surgery becomes required   - if recovers from with non-operative management, will require effective bowel regimen to minimize constipation  - currently no indication for surgical intervention, but will continue to follow along with primary team  - ambulation encouraged  All of the above findings and recommendations were discussed with the patient and her family, and all of patient's and her family's questions were answered to her expressed satisfaction.  Thank you for the opportunity to participate in this patient's care.   -- Marilynne Drivers Rosana Hoes, MD, [place]: [facility] - Partnering for exceptional care. Office: [phone]

## 2017-11-29 NOTE — H&P (Signed)
Diana [name] is an 83 y.o. female.   Chief Complaint: Weakness HPI: The patient with past medical history of pulmonary hypertension, lupus, hypertension and chronic kidney disease presents to the emergency department complaining of weakness following innumerable episodes of nonbloody nonbilious emesis and nonbloody diarrhea.  The patient's symptoms started yesterday.  The patient is surgical history for what sounds like a volvulus but CT of her abdomen ruled out bowel obstruction.  However it did show acute inflammation of the sigmoid colon consistent with diverticulitis.  The patient was started on IV antibiotics prior to the emergency department staff called the hospitalist service for admission.  Past Medical History:  Diagnosis Date  . Acute glomerulonephritis with other specified pathological lesion in kidney in disease classified elsewhere(580.81)   . Alveolar aeration decreased   . Anemia   . Arthritis   . Atrial fibrillation ([place])   . Breast cancer ([place]) 06/03/2014   positive, radiation  . Cancer (HCC)    Breast  . CHF (congestive heart failure) ([place])   . Diaphragmatic hernia   . DVT (deep venous thrombosis) ([place])   . Dysrhythmia   . Environmental allergies   . Esophageal reflux   . Feeding problem    FEEDING TUBE  . Hiatal hernia   . Hyperlipidemia   . Hypertension   . Lower extremity edema   . Lupus (systemic lupus erythematosus) ([place])   . Neuropathy   . Osteopenia   . Oxygen deficiency    USES HS  . Peripheral neuropathy, hereditary/idiopathic   . Personal history of radiation therapy   . Pneumonia   . Primary pulmonary hypertension ([place])   . Pulmonary hypertension ([place])   . Renal insufficiency   . Sciatica   . Shingles   . Shingles (herpes zoster) polyneuropathy March 2013   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  . Shortness of breath dyspnea   . Unspecified hypothyroidism   . Unspecified menopausal and postmenopausal disorder   . Urinary incontinence without  sensory awareness   . Vitamin B deficiency     Past Surgical History:  Procedure Laterality Date  . BREAST BIOPSY Left 2015   invasive mammary carcinoma  . BREAST LUMPECTOMY Left 2015   invasive mammary carcinoma clear margins but close  . BREAST SURGERY    . CATARACT EXTRACTION W/PHACO Right 04/05/2016   Procedure: CATARACT EXTRACTION PHACO AND INTRAOCULAR LENS PLACEMENT (IOC);  Surgeon: Birder Robson, MD;  Location: [facility] ORS;  Service: Ophthalmology;  Laterality: Right;  [place] 01:20 AP% 23.9 CDE 19.29 fluid pack lot # [id number]  . CATARACT EXTRACTION W/PHACO Left 04/26/2016   Procedure: CATARACT EXTRACTION PHACO AND INTRAOCULAR LENS PLACEMENT ([place]);  Surgeon: Birder Robson, MD;  Location: [facility] ORS;  Service: Ophthalmology;  Laterality: Left;   [place] 00:59 AP% 23.2 CDE 13.83 Fluid pack lot # [id number]  . DIAGNOSTIC LAPAROSCOPY    . ESOPHAGOGASTRODUODENOSCOPY N/A 07/19/2015   Procedure: ESOPHAGOGASTRODUODENOSCOPY (EGD);  Surgeon: Hulen Luster, MD;  Location[facility] ENDOSCOPY;  Service: Gastroenterology;  Laterality: N/A;  . ESOPHAGOGASTRODUODENOSCOPY (EGD) WITH PROPOFOL N/A 03/28/2015   Procedure: ESOPHAGOGASTRODUODENOSCOPY (EGD) WITH PROPOFOL;  Surgeon: Robert Bellow, MD;  Location: [facility] ENDOSCOPY;  Service: Endoscopy;  Laterality: N/A;  . ESOPHAGOGASTRODUODENOSCOPY (EGD) WITH PROPOFOL N/A 06/10/2015   Procedure: ESOPHAGOGASTRODUODENOSCOPY (EGD) WITH PROPOFOL;  Surgeon: Lollie Sails, MD;  Location: [facility] ENDOSCOPY;  Service: Endoscopy;  Laterality: N/A;  . ESOPHAGOGASTRODUODENOSCOPY (EGD) WITH PROPOFOL N/A 10/20/2015   Procedure: ESOPHAGOGASTRODUODENOSCOPY (EGD) WITH PROPOFOL;  Surgeon: Lollie Sails, MD;  Location:  [place] ENDOSCOPY;  Service: Endoscopy;  Laterality: N/A;  . ESOPHAGOGASTRODUODENOSCOPY (EGD) WITH PROPOFOL N/A 02/23/2016   Procedure: ESOPHAGOGASTRODUODENOSCOPY (EGD) WITH PROPOFOL;  Surgeon: Lollie Sails, MD;  Location: [facility] ENDOSCOPY;  Service: Endoscopy;  Laterality:  N/A;  . ESOPHAGOGASTRODUODENOSCOPY (EGD) WITH PROPOFOL N/A 06/30/2016   Procedure: ESOPHAGOGASTRODUODENOSCOPY (EGD) WITH PROPOFOL;  Surgeon: Lollie Sails, MD;  Location: [facility] ENDOSCOPY;  Service: Endoscopy;  Laterality: N/A;  . ESOPHAGOGASTRODUODENOSCOPY (EGD) WITH PROPOFOL N/A 01/05/2017   Procedure: ESOPHAGOGASTRODUODENOSCOPY (EGD) WITH PROPOFOL;  Surgeon: Lollie Sails, MD;  Location: [facility] ENDOSCOPY;  Service: Endoscopy;  Laterality: N/A;  . KYPHOPLASTY N/A 02/10/2015   Procedure: [id number];  Surgeon: Hessie Knows, MD;  Location: [facility] ORS;  Service: Orthopedics;  Laterality: N/A;  . LAPAROTOMY N/A 03/11/2015   Procedure: REDUCTION OF GASTRIC VOLVULUS, SPLEENECTOMY, GASTRIC TUBE PLACEMENT;  Surgeon: Robert Bellow, MD;  Location: [facility] ORS;  Service: General;  Laterality: N/A;  . MOHS SURGERY    . PERIPHERAL VASCULAR CATHETERIZATION N/A 06/12/2015   Procedure: IVC Filter Insertion;  Surgeon: Algernon Huxley, MD;  Location: [place] CV LAB;  Service: Cardiovascular;  Laterality: N/A;  . PERIPHERAL VASCULAR CATHETERIZATION N/A 08/23/2016   Procedure: IVC Filter Removal;  Surgeon: Katha Cabal, MD;  Location: [facility] LAB;  Service: Cardiovascular;  Laterality: N/A;  . STOMACH SURGERY     for volvolus    Family History  Problem Relation Age of Onset  . Colon cancer Mother   . Alcohol abuse Sister   . Breast cancer Sister 41  . Cancer Brother   . Lung cancer Son    Social History:  reports that she quit smoking about 26 years ago. she has never used smokeless tobacco. She reports that she does not drink alcohol or use drugs.  Allergies:  Allergies  Allergen Reactions  . Tramadol Other (See Comments) and Rash    Rash  . Amoxicillin Other (See Comments)    Reaction:  Stomach cramps   . Cefuroxime Itching  . Contrast Media [Iodinated Diagnostic Agents] Hives  . Metrizamide Hives  . Morphine And Related     Reaction: face flushed    . Propoxyphene      Stomach cramp  . Cephalexin Rash  . Sulfa Antibiotics Rash    REACTION: Rash  . Sulfonamide Derivatives Rash  . Tramadol Hcl Rash  . Venlafaxine Rash    Medications Prior to Admission  Medication Sig Dispense Refill  . Ascorbic Acid (VITAMIN C) 1000 MG tablet Take 1,000 mg by mouth daily.    . Calcium Carbonate-Vitamin D 600-400 MG-UNIT tablet Take 1 tablet by mouth daily.    . cyanocobalamin (,VITAMIN B-12,) 1000 MCG/ML injection Inject 1 mL (1,000 mcg total) into the muscle every 30 (thirty) days. Pt uses on the 1st of every month. 10 mL 1  . hydroxychloroquine (PLAQUENIL) 200 MG tablet Take 200 mg by mouth daily.    . Lactulose 20 GM/30ML SOLN 30 ml every 4 hours until constipation is relieved 236 mL 3  . letrozole (FEMARA) 2.5 MG tablet Take 1 tablet (2.5 mg total) by mouth daily. 30 tablet 0  . levothyroxine (SYNTHROID, LEVOTHROID) 150 MCG tablet Take 1 tablet (150 mcg total) by mouth daily. 90 tablet 3  . linaclotide (LINZESS) 145 MCG CAPS capsule Take 1 capsule (145 mcg total) by mouth daily before breakfast. 30 capsule 2  . lisinopril (PRINIVIL,ZESTRIL) 5 MG tablet TAKE 1 TABLET (5 MG TOTAL) BY MOUTH DAILY. 90 tablet 3  .  metoCLOPramide (REGLAN) 10 MG tablet TAKE 1 TABLET (10 MG TOTAL) BY MOUTH 4 (FOUR) TIMES DAILY - BEFORE MEALS AND AT BEDTIME. 360 tablet 1  . metoprolol succinate (TOPROL-XL) 25 MG 24 hr tablet Take 12.5 mg by mouth daily.     . mycophenolate (CELLCEPT) 500 MG tablet Take 1,000 mg by mouth 2 (two) times daily.    Marland Kitchen nystatin cream (MYCOSTATIN) Apply 1 application topically 2 (two) times daily. 30 g 0  . oxyCODONE-acetaminophen (PERCOCET/ROXICET) 5-325 MG tablet Take 1 tablet by mouth every 8 (eight) hours as needed for severe pain. 65 tablet 0  . OXYGEN Inhale 2 mLs into the lungs at bedtime.    . pantoprazole (PROTONIX) 40 MG tablet Take 40 mg by mouth 2 (two) times daily.    . Polyvinyl Alcohol-Povidone (REFRESH OP) Place 2 drops into both eyes daily as needed  (dry eyes).     . predniSONE (DELTASONE) 5 MG tablet Take 5 mg by mouth daily.     . promethazine (PHENERGAN) 12.5 MG tablet Take 1 tablet (12.5 mg total) by mouth every 8 (eight) hours as needed for nausea or vomiting. 20 tablet 5  . sterile water for irrigation 237 mL Give 2.5 mL/hr by tube continuous.    . sucralfate (CARAFATE) 1 GM/10ML suspension Take 1 g by mouth 4 (four) times daily. Before meals and nightly    . Syringe/Needle, Disp, (SYRINGE 3CC/25GX1") 25G X 1" 3 ML MISC Use for b12 injections 50 each 0  . Tadalafil, PAH, (ADCIRCA) 20 MG TABS Take 40 mg by mouth daily.    Marland Kitchen warfarin (COUMADIN) 3 MG tablet Take 1 tablet (3 mg total) by mouth daily. (Patient taking differently: Take 3 mg by mouth every other day. ) 30 tablet 2  . warfarin (COUMADIN) 4 MG tablet Take 1 tablet (4 mg total) by mouth daily. (Patient taking differently: Take 4 mg by mouth every other day. ) 30 tablet 2    Results for orders placed or performed during the hospital encounter of 11/28/17 (from the past 48 hour(s))  CBC     Status: Abnormal   Collection Time: 11/28/17  9:52 PM  Result Value Ref Range   WBC 6.4 3.6 - 11.0 K/uL   RBC 3.59 (L) 3.80 - 5.20 MIL/uL   Hemoglobin 11.7 (L) 12.0 - 16.0 g/dL   HCT 35.5 35.0 - 47.0 %   MCV 98.9 80.0 - 100.0 fL   MCH 32.6 26.0 - 34.0 pg   MCHC 32.9 32.0 - 36.0 g/dL   RDW 15.3 (H) 11.5 - 14.5 %   Platelets 349 150 - 440 K/uL    Comment: Performed at [facility], [address] [place], [place] [address]  Comprehensive metabolic panel     Status: Abnormal   Collection Time: 11/28/17  9:52 PM  Result Value Ref Range   Sodium 147 (H) 135 - 145 mmol/L   Potassium 4.9 3.5 - 5.1 mmol/L   Chloride 116 (H) 101 - 111 mmol/L   CO2 20 (L) 22 - 32 mmol/L   Glucose, Bld 159 (H) 65 - 99 mg/dL   BUN 30 (H) 6 - 20 mg/dL   Creatinine, Ser 1.81 (H) 0.44 - 1.00 mg/dL   Calcium 9.4 8.9 - 10.3 mg/dL   Total Protein 7.5 6.5 - 8.1 g/dL   Albumin 3.4 (L) 3.5 - 5.0 g/dL    AST 27 15 - 41 U/L   ALT 10 (L) 14 - 54 U/L   Alkaline  Phosphatase 75 38 - 126 U/L   Total Bilirubin 0.6 0.3 - 1.2 mg/dL   GFR calc non Af Amer 25 (L) >60 mL/min   GFR calc Af Amer 29 (L) >60 mL/min    Comment: (NOTE) The eGFR has been calculated using the CKD EPI equation. This calculation has not been validated in all clinical situations. eGFR's persistently <60 mL/min signify possible Chronic Kidney Disease.    Anion gap 11 5 - 15    Comment: Performed at [facility], [address] [place], [place] [address]  Troponin I     Status: Abnormal   Collection Time: 11/28/17  9:52 PM  Result Value Ref Range   Troponin I 0.03 (HH) <0.03 ng/mL    Comment: CRITICAL RESULT CALLED TO, READ BACK BY AND VERIFIED WITH LEE FERGUSON ON 11/28/17 AT 2340 JAG Performed at [facility], [address] [place], [place] [address]   Lipase, blood     Status: None   Collection Time: 11/28/17  9:52 PM  Result Value Ref Range   Lipase 18 11 - 51 U/L    Comment: Performed at [facility], [address] [place], [place] [address]  Urinalysis, Routine w reflex microscopic     Status: Abnormal   Collection Time: 11/28/17  9:52 PM  Result Value Ref Range   Color, Urine YELLOW (A) YELLOW   APPearance CLOUDY (A) CLEAR   Specific Gravity, Urine 1.014 1.005 - 1.030   pH 5.0 5.0 - 8.0   Glucose, UA NEGATIVE NEGATIVE mg/dL   Hgb urine dipstick NEGATIVE NEGATIVE   Bilirubin Urine NEGATIVE NEGATIVE   Ketones, ur NEGATIVE NEGATIVE mg/dL   Protein, ur NEGATIVE NEGATIVE mg/dL   Nitrite NEGATIVE NEGATIVE   Leukocytes, UA LARGE (A) NEGATIVE   RBC / HPF 6-30 0 - 5 RBC/hpf   WBC, UA TOO NUMEROUS TO COUNT 0 - 5 WBC/hpf   Bacteria, UA NONE SEEN NONE SEEN   Squamous Epithelial / LPF 6-30 (A) NONE SEEN   WBC Clumps PRESENT    Mucus PRESENT    Hyaline Casts, UA PRESENT    Non Squamous Epithelial 0-5 (A) NONE SEEN    Comment: Performed at [facility], [address] [place], [place] [address]  Protime-INR     Status: Abnormal   Collection Time: 11/29/17  4:23 AM  Result Value Ref Range   Prothrombin Time 33.1 (H) 11.4 - 15.2 seconds   INR 3.28     Comment: Performed at [facility], [address] [place], [place] [address]  TSH     Status: Abnormal   Collection Time: 11/29/17  4:23 AM  Result Value Ref Range   TSH 0.058 (L) 0.350 - 4.500 uIU/mL    Comment: Performed by a 3rd Generation assay with a functional sensitivity of <=0.01 uIU/mL. Performed at [facility], [address] [place], [place] [address]   Troponin I     Status: Abnormal   Collection Time: 11/29/17  4:23 AM  Result Value Ref Range   Troponin I 0.05 (HH) <0.03 ng/mL    Comment: CRITICAL VALUE NOTED. VALUE IS CONSISTENT WITH PREVIOUSLY REPORTED/CALLED VALUE JAG Performed at [facility], [address] [place],  [address]    Ct Abdomen Pelvis Wo Contrast  Result Date: 11/28/2017 CLINICAL DATA:  Weakness. Recurrent vomiting and diarrhea today. History of breast cancer, gastric volvulus, splenectomy. EXAM: CT ABDOMEN AND PELVIS WITHOUT CONTRAST TECHNIQUE: Multidetector CT imaging of the abdomen  and pelvis was performed following the standard protocol without IV contrast. COMPARISON:  CT abdomen and pelvis June 08, 2015 and abdominal radiograph October 18, 2017 FINDINGS: LOWER CHEST: Bilateral lower lobe atelectasis. Borderline cardiomegaly. Moderate coronary artery calcification. No pericardial effusion. HEPATOBILIARY: Normal. PANCREAS: Atrophic, nonacute. SPLEEN: Normal. ADRENALS/URINARY TRACT: Ptotic right kidney. Kidneys demonstrate normal size and morphology. Punctate RIGHT lower pole nephrolithiasis. No hydronephrosis; limited assessment for renal masses on this nonenhanced examination. 12 mm RIGHT lower pole cyst. Stable 2.9 cm homogeneously hypodense cyst upper pole LEFT kidney. Subcentimeter exophytic cyst lower pole LEFT kidney. The  unopacified ureters are normal in course and caliber. Urinary bladder is well distended and unremarkable. Normal adrenal glands. STOMACH/BOWEL: Large hiatal hernia with the majority of the stomach in the chest. Severe descending and sigmoid colonic diverticulosis with superimposed short segment of proximal sigmoid bowel wall thickening and pericolonic inflammation. Small and large bowel air-fluid levels. Small bowel are normal in course and caliber without inflammatory changes, sensitivity decreased by lack of enteric contrast. Normal appendix. VASCULAR/LYMPHATIC: Moderate calcific atherosclerosis, tortuous aorta. No lymphadenopathy by CT size criteria. REPRODUCTIVE: Normal. OTHER: No intraperitoneal free fluid or free air. MUSCULOSKELETAL: Non-acute. Osteopenia. Broad thoracolumbar dextroscoliosis, status post L1 and L2 vertebral body cement augmentation without further height loss. Stable linear sclerosis in central sacrum, possible old insufficiency injury. Anterior abdominal wall ligamentous laxity. Multiple small to moderate fat containing supraumbilical ventral hernias. IMPRESSION: 1. Acute sigmoid diverticulitis without complication. Mild enteritis. 2. Large hiatal hernia. 3. Punctate nonobstructing RIGHT nephrolithiasis. Aortic Atherosclerosis (ICD10-I70.0). Electronically Signed   By: Elon Alas M.D.   On: 11/28/2017 21:13    Review of Systems  Constitutional: Negative for chills and fever.  HENT: Negative for sore throat and tinnitus.   Eyes: Negative for blurred vision and redness.  Respiratory: Negative for cough and shortness of breath.   Cardiovascular: Negative for chest pain, palpitations, orthopnea and PND.  Gastrointestinal: Positive for abdominal pain, diarrhea, nausea and vomiting.  Genitourinary: Negative for dysuria, frequency and urgency.  Musculoskeletal: Negative for joint pain and myalgias.  Skin: Negative for rash.       No lesions  Neurological: Negative for speech  change, focal weakness and weakness.  Endo/Heme/Allergies: Does not bruise/bleed easily.       No temperature intolerance  Psychiatric/Behavioral: Negative for depression and suicidal ideas.    Blood pressure (!) 98/58, pulse 92, temperature 98.8 F (37.1 C), temperature source Oral, resp. rate 18, height 5' 1"  (1.549 m), weight 57.2 kg (126 lb 1.6 oz), SpO2 93 %. Physical Exam  Vitals reviewed. Constitutional: She is oriented to person, place, and time. She appears well-developed and well-nourished. No distress.  HENT:  Head: Normocephalic and atraumatic.  Mouth/Throat: Oropharynx is clear and moist.  Eyes: Conjunctivae and EOM are normal. Pupils are equal, round, and reactive to light. No scleral icterus.  Neck: Normal range of motion. Neck supple. No JVD present. No tracheal deviation present. No thyromegaly present.  Cardiovascular: Normal rate, regular rhythm and normal heart sounds. Exam reveals no gallop and no friction rub.  No murmur heard. Respiratory: Effort normal and breath sounds normal.  GI: Soft. Bowel sounds are normal. She exhibits no distension. There is no tenderness.  Genitourinary:  Genitourinary Comments: Deferred  Musculoskeletal: Normal range of motion. She exhibits no edema.  Lymphadenopathy:    She has no cervical adenopathy.  Neurological: She is alert and oriented to person, place, and time. No cranial nerve deficit. She exhibits normal muscle tone.  Skin: Skin  is warm and dry. No rash noted. No erythema.  Psychiatric: She has a normal mood and affect. Her behavior is normal. Judgment and thought content normal.     Assessment/Plan This is an 83 year old female admitted for diverticulitis. 1.  Diverticulitis: acute sigmoid.  Continue Flagyl and Cipro.  The patient has no signs or symptoms of sepsis.  Clear liquid diet until able to advance diet 2.  SLE: Continue prednisone, Plaquenil and CellCept. 3.  Pulmonary hypertension: Continue tadalafil and  warfarin.  Supplemental oxygen as needed 4.  CKD: Stage III: Hydrate with intravenous fluid.  Avoid nephrotoxic agents. 5.  Hypothyroidism: Check TSH; continue Synthroid 6.  History of breast cancer: Continue letrozole 7.  DVT prophylaxis: Full dose anticoagulation 8.  GI prophylaxis: Pantoprazole per home regimen as well as sucralfate The patient is a full code.  Time spent on admission orders and patient care approximately 45 minutes  Harrie Foreman, MD 11/29/2017, 7:31 AM

## 2017-11-29 NOTE — Progress Notes (Addendum)
Pharmacy Antibiotic Note  Diana [name] is a 83 y.o. female admitted on 11/28/2017 with intra-abdominal infection.  Pharmacy has been consulted for ciprofloxacin dosing.  Plan: Will start patient on cipro 500 mg PO q18h  Patient also on flagyl 500 mg IV q8h; however, will transition this to PO as well since patient is able to tolerate PO and is on a clear liquid diet. QTc 414 WNL  Height: 5\' 1"  (154.9 cm) Weight: 129 lb (58.5 kg) IBW/kg (Calculated) : 47.8  Temp (24hrs), Avg:98.5 F (36.9 C), Min:98.2 F (36.8 C), Max:98.8 F (37.1 C)  Recent Labs  Lab 11/28/17 2152  WBC 6.4  CREATININE 1.81*    Estimated Creatinine Clearance: 20.4 mL/min (A) (by C-G formula based on SCr of 1.81 mg/dL (H)).    Allergies  Allergen Reactions  . Tramadol Other (See Comments) and Rash    Rash  . Amoxicillin Other (See Comments)    Reaction:  Stomach cramps   . Cefuroxime Itching  . Contrast Media [Iodinated Diagnostic Agents] Hives  . Metrizamide Hives  . Morphine And Related     Reaction: face flushed    . Propoxyphene     Stomach cramp  . Cephalexin Rash  . Sulfa Antibiotics Rash    REACTION: Rash  . Sulfonamide Derivatives Rash  . Tramadol Hcl Rash  . Venlafaxine Rash    Thank you for allowing pharmacy to be a part of this patient's care.  Tobie Lords, PharmD, BCPS Clinical Pharmacist 11/29/2017

## 2017-11-29 NOTE — ED Notes (Signed)
Awaiting room assignment at this time. Will continue to keep patient and family member updated.

## 2017-11-29 NOTE — Progress Notes (Signed)
Patient admitted this morning with acute diverticulitis. Agree with plan is ordered by admitting M.D. I have spoken with Dr. Rosana Hoes from surgery as well for surgical consultation for acute diverticulitis.

## 2017-11-29 NOTE — Progress Notes (Signed)
ANTICOAGULATION CONSULT NOTE - Initial Consult  Pharmacy Consult for warfarin  Indication: atrial fibrillation  Allergies  Allergen Reactions  . Tramadol Other (See Comments) and Rash    Rash  . Amoxicillin Other (See Comments)    Reaction:  Stomach cramps   . Cefuroxime Itching  . Contrast Media [Iodinated Diagnostic Agents] Hives  . Metrizamide Hives  . Morphine And Related     Reaction: face flushed    . Propoxyphene     Stomach cramp  . Cephalexin Rash  . Sulfa Antibiotics Rash    REACTION: Rash  . Sulfonamide Derivatives Rash  . Tramadol Hcl Rash  . Venlafaxine Rash    Patient Measurements: Height: 5\' 1"  (154.9 cm) Weight: 129 lb (58.5 kg) IBW/kg (Calculated) : 47.8   Vital Signs: Temp: 98.8 F (37.1 C) (03/13 0359) Temp Source: Oral (03/13 0359) BP: 98/58 (03/13 0359) Pulse Rate: 92 (03/13 0359)  Labs: Recent Labs    11/28/17 2152 11/29/17 0423  HGB 11.7*  --   HCT 35.5  --   PLT 349  --   LABPROT  --  33.1*  INR  --  3.28  CREATININE 1.81*  --   TROPONINI 0.03* 0.05*    Estimated Creatinine Clearance: 20.4 mL/min (A) (by C-G formula based on SCr of 1.81 mg/dL (H)).   Medical History: Past Medical History:  Diagnosis Date  . Acute glomerulonephritis with other specified pathological lesion in kidney in disease classified elsewhere(580.81)   . Alveolar aeration decreased   . Anemia   . Arthritis   . Atrial fibrillation ([place])   . Breast cancer ([place]) 06/03/2014   positive, radiation  . Cancer (HCC)    Breast  . CHF (congestive heart failure) ([place])   . Diaphragmatic hernia   . DVT (deep venous thrombosis) ([place])   . Dysrhythmia   . Environmental allergies   . Esophageal reflux   . Feeding problem    FEEDING TUBE  . Hiatal hernia   . Hyperlipidemia   . Hypertension   . Lower extremity edema   . Lupus (systemic lupus erythematosus) ([place])   . Neuropathy   . Osteopenia   . Oxygen deficiency    USES HS  . Peripheral neuropathy,  hereditary/idiopathic   . Personal history of radiation therapy   . Pneumonia   . Primary pulmonary hypertension ([place])   . Pulmonary hypertension ([place])   . Renal insufficiency   . Sciatica   . Shingles   . Shingles (herpes zoster) polyneuropathy March 2013   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  . Shortness of breath dyspnea   . Unspecified hypothyroidism   . Unspecified menopausal and postmenopausal disorder   . Urinary incontinence without sensory awareness   . Vitamin B deficiency     Medications:  Scheduled:  . calcium-vitamin D  1 tablet Oral Daily  . ciprofloxacin  500 mg Oral Q18H  . [START ON 12/18/2017] cyanocobalamin  1,000 mcg Intramuscular Q30 days  . docusate sodium  100 mg Oral BID  . hydroxychloroquine  200 mg Oral Daily  . letrozole  2.5 mg Oral Daily  . levothyroxine  150 mcg Oral QAC breakfast  . linaclotide  145 mcg Oral QAC breakfast  . lisinopril  5 mg Oral Daily  . metoCLOPramide  10 mg Oral TID AC & HS  . metoprolol succinate  12.5 mg Oral Daily  . mycophenolate  1,000 mg Oral BID  . nystatin cream  1 application Topical BID  . pantoprazole  40  mg Oral BID  . predniSONE  5 mg Oral Daily  . sucralfate  1 g Oral QID  . tadalafil (PAH)  40 mg Oral Daily  . vitamin C  1,000 mg Oral Daily  . warfarin  3 mg Oral QODAY  . warfarin  4 mg Oral QODAY    Assessment: Patient admitted for N/V found to have acute diverticulitis on CT and is started on cipro/flagyl. Patient takes warfarin PTA for anticoagulation for chronic afib. Warfarin PTA dose: Warfarin 3 mg every other day Warfarin 4 mg every other day However, according to daughter not sure what days the patient takes which dose.  03/13 (baseline) INR 3.28  Goal of Therapy:  INR 2-3 Monitor platelets by anticoagulation protocol: Yes   Plan:  Considering patient has a slightly supratherapeutic INR and is also on abx therapy for acute diverticulitis, Will hold off on any warfarin doses for now. Will  recheck daily INR's and will redose at a more conservative dose once INR < 3. Will monitor daily INRs and CBC's and will adjust doses accordingly.  Tobie Lords, PharmD, BCPS Clinical Pharmacist 11/29/2017

## 2017-11-29 NOTE — ED Provider Notes (Signed)
-----------------------------------------   12:59 AM on 11/29/2017 -----------------------------------------  Updated patient and son of metabolic panel and troponin.  Recommend hospitalization for IV antibiotics, IV hydration.  Tachycardia has improved, but patient remains at 105 resting.  Patient agreeable.   Paulette Blanch, MD 11/29/17 [phone]

## 2017-11-30 ENCOUNTER — Inpatient Hospital Stay: Payer: Medicare Other

## 2017-11-30 ENCOUNTER — Inpatient Hospital Stay: Payer: Medicare Other | Admitting: Oncology

## 2017-11-30 LAB — BASIC METABOLIC PANEL
ANION GAP: 5 (ref 5–15)
BUN: 24 mg/dL — ABNORMAL HIGH (ref 6–20)
CALCIUM: 7.9 mg/dL — AB (ref 8.9–10.3)
CO2: 20 mmol/L — AB (ref 22–32)
CREATININE: 1.25 mg/dL — AB (ref 0.44–1.00)
Chloride: 123 mmol/L — ABNORMAL HIGH (ref 101–111)
GFR, EST AFRICAN AMERICAN: 46 mL/min — AB (ref >60)
GFR, EST NON AFRICAN AMERICAN: 40 mL/min — AB (ref >60)
Glucose, Bld: 92 mg/dL (ref 65–99)
Potassium: 3.8 mmol/L (ref 3.5–5.1)
Sodium: 148 mmol/L — ABNORMAL HIGH (ref 135–145)

## 2017-11-30 LAB — MISC LABCORP TEST (SEND OUT): Labcorp test code: 183480

## 2017-11-30 LAB — PROTIME-INR
INR: 3.19
Prothrombin Time: 32.4 seconds — ABNORMAL HIGH (ref 11.4–15.2)

## 2017-11-30 LAB — CBC
HEMATOCRIT: 27.9 % — AB (ref 35.0–47.0)
Hemoglobin: 8.8 g/dL — ABNORMAL LOW (ref 12.0–16.0)
MCH: 31.7 pg (ref 26.0–34.0)
MCHC: 31.5 g/dL — AB (ref 32.0–36.0)
MCV: 100.8 fL — ABNORMAL HIGH (ref 80.0–100.0)
PLATELETS: 309 10*3/uL (ref 150–440)
RBC: 2.77 MIL/uL — ABNORMAL LOW (ref 3.80–5.20)
RDW: 15.5 % — AB (ref 11.5–14.5)
WBC: 4.4 10*3/uL (ref 3.6–11.0)

## 2017-11-30 LAB — URINE CULTURE

## 2017-11-30 MED ORDER — LEVOTHYROXINE SODIUM 150 MCG PO TABS
150.0000 ug | ORAL_TABLET | Freq: Every day | ORAL | Status: DC
  Administered 2017-11-30 – 2017-12-02 (×3): 150 ug via ORAL
  Filled 2017-11-30: qty 3
  Filled 2017-11-30: qty 1
  Filled 2017-11-30: qty 3
  Filled 2017-11-30 (×2): qty 1

## 2017-11-30 NOTE — Progress Notes (Signed)
ANTICOAGULATION CONSULT NOTE - Initial Consult  Pharmacy Consult for warfarin  Indication: atrial fibrillation  Allergies  Allergen Reactions  . Tramadol Other (See Comments) and Rash    Rash  . Amoxicillin Other (See Comments)    Reaction:  Stomach cramps   . Cefuroxime Itching  . Contrast Media [Iodinated Diagnostic Agents] Hives  . Metrizamide Hives  . Morphine And Related     Reaction: face flushed    . Propoxyphene     Stomach cramp  . Cephalexin Rash  . Sulfa Antibiotics Rash    REACTION: Rash  . Sulfonamide Derivatives Rash  . Tramadol Hcl Rash  . Venlafaxine Rash    Patient Measurements: Height: 5\' 1"  (154.9 cm) Weight: 129 lb (58.5 kg) IBW/kg (Calculated) : 47.8   Vital Signs: Temp: 98.1 F (36.7 C) (03/14 0554) Temp Source: Oral (03/14 0554) BP: 110/53 (03/14 0554) Pulse Rate: 66 (03/14 0554)  Labs: Recent Labs    11/28/17 2152 11/29/17 0423 11/29/17 1007 11/29/17 1554 11/30/17 0406  HGB 11.7*  --   --   --  8.8*  HCT 35.5  --   --   --  27.9*  PLT 349  --   --   --  309  LABPROT  --  33.1*  --   --  32.4*  INR  --  3.28  --   --  3.19  CREATININE 1.81*  --   --   --  1.25*  TROPONINI 0.03* 0.05* 0.04* 0.04*  --     Estimated Creatinine Clearance: 29.5 mL/min (A) (by C-G formula based on SCr of 1.25 mg/dL (H)).   Medical History: Past Medical History:  Diagnosis Date  . Acute glomerulonephritis with other specified pathological lesion in kidney in disease classified elsewhere(580.81)   . Alveolar aeration decreased   . Anemia   . Arthritis   . Atrial fibrillation ([place])   . Breast cancer ([place]) 06/03/2014   positive, radiation  . Cancer (HCC)    Breast  . CHF (congestive heart failure) ([place])   . Diaphragmatic hernia   . DVT (deep venous thrombosis) ([place])   . Dysrhythmia   . Environmental allergies   . Esophageal reflux   . Feeding problem    FEEDING TUBE  . Hiatal hernia   . Hyperlipidemia   . Hypertension   . Lower extremity  edema   . Lupus (systemic lupus erythematosus) ([place])   . Neuropathy   . Osteopenia   . Oxygen deficiency    USES HS  . Peripheral neuropathy, hereditary/idiopathic   . Personal history of radiation therapy   . Pneumonia   . Primary pulmonary hypertension ([place])   . Pulmonary hypertension ([place])   . Renal insufficiency   . Sciatica   . Shingles   . Shingles (herpes zoster) polyneuropathy March 2013   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  . Shortness of breath dyspnea   . Unspecified hypothyroidism   . Unspecified menopausal and postmenopausal disorder   . Urinary incontinence without sensory awareness   . Vitamin B deficiency     Medications:  Scheduled:  . calcium-vitamin D  1 tablet Oral Daily  . ciprofloxacin  500 mg Oral Q18H  . [START ON 12/18/2017] cyanocobalamin  1,000 mcg Intramuscular Q30 days  . docusate sodium  100 mg Oral BID  . hydroxychloroquine  200 mg Oral Daily  . letrozole  2.5 mg Oral Daily  . levothyroxine  150 mcg Oral QAC breakfast  . linaclotide  145 mcg  Oral QAC breakfast  . metoCLOPramide  10 mg Oral TID AC & HS  . metoprolol succinate  12.5 mg Oral Daily  . metroNIDAZOLE  500 mg Oral Q8H  . mycophenolate  1,000 mg Oral BID  . nystatin cream  1 application Topical BID  . pantoprazole  40 mg Oral BID  . predniSONE  5 mg Oral Daily  . sucralfate  1 g Oral QID  . tadalafil (PAH)  40 mg Oral Daily  . vitamin C  1,000 mg Oral Daily    Assessment: Patient admitted for N/V found to have acute diverticulitis on CT and is started on cipro/flagyl. Patient takes warfarin PTA for anticoagulation for chronic afib. Warfarin PTA dose: Warfarin 3 mg every other day Warfarin 4 mg every other day However, according to daughter not sure what days the patient takes which dose.  03/13 (baseline) INR 3.28 No warfarin 03/14 INR 3.19 No warfarin   Goal of Therapy:  INR 2-3 Monitor platelets by anticoagulation protocol: Yes   Plan:  Considering patient has a  slightly supratherapeutic INR and is also on abx therapy for acute diverticulitis, Will hold off on any warfarin doses for now. Will recheck daily INR's and will redose at a more conservative dose once INR < 3. Will monitor daily INRs and CBC's and will adjust doses accordingly.  Thomasenia Sales, PharmD, MBA, [facility]   11/30/2017

## 2017-11-30 NOTE — Progress Notes (Signed)
[facility] - [place] [facility]   PATIENT NAME: Diana [name]    MR#:  [id number]  DATE OF BIRTH:  12/06/1934  SUBJECTIVE:   patient feeling better but not 100%  Had BM this am REVIEW OF SYSTEMS:    Review of Systems  Constitutional: Negative for fever, chills weight loss HENT: Negative for ear pain, nosebleeds, congestion, facial swelling, rhinorrhea, neck pain, neck stiffness and ear discharge.   Respiratory: Negative for cough, shortness of breath, wheezing  Cardiovascular: Negative for chest pain, palpitations and leg swelling.  Gastrointestinal: Negative for heartburn, abdominal pain (better), vomiting, diarrhea or consitpation Genitourinary: Negative for dysuria, urgency, frequency, hematuria Musculoskeletal: Negative for back pain or joint pain Neurological: Negative for dizziness, seizures, syncope, focal weakness,  numbness and headaches.  Hematological: Does not bruise/bleed easily.  Psychiatric/Behavioral: Negative for hallucinations, confusion, dysphoric mood    Tolerating Diet: yes      DRUG ALLERGIES:   Allergies  Allergen Reactions  . Tramadol Other (See Comments) and Rash    Rash  . Amoxicillin Other (See Comments)    Reaction:  Stomach cramps   . Cefuroxime Itching  . Contrast Media [Iodinated Diagnostic Agents] Hives  . Metrizamide Hives  . Morphine And Related     Reaction: face flushed    . Propoxyphene     Stomach cramp  . Cephalexin Rash  . Sulfa Antibiotics Rash    REACTION: Rash  . Sulfonamide Derivatives Rash  . Tramadol Hcl Rash  . Venlafaxine Rash    VITALS:  Blood pressure (!) 110/53, pulse 66, temperature 98.1 F (36.7 C), temperature source Oral, resp. rate 17, height 5\' 1"  (1.549 m), weight 58.5 kg (129 lb), SpO2 96 %.  PHYSICAL EXAMINATION:  Constitutional: Appears well-developed and well-nourished. No distress. HENT: Normocephalic. Marland Kitchen Oropharynx is clear and moist.  Eyes: Conjunctivae and EOM are  normal. PERRLA, no scleral icterus.  Neck: Normal ROM. Neck supple. No JVD. No tracheal deviation. CVS: RRR, S1/S2 +, no murmurs, no gallops, no carotid bruit.  Pulmonary: Effort and breath sounds normal, no stridor, rhonchi, wheezes, rales.  Abdominal: Soft. BS +,  no distension, tenderness, rebound or guarding.  Musculoskeletal: Normal range of motion. No edema and no tenderness.  Neuro: Alert. CN 2-12 grossly intact. No focal deficits. Skin: Skin is warm and dry. No rash noted. Psychiatric: Normal mood and affect.      LABORATORY PANEL:   CBC Recent Labs  Lab 11/30/17 0406  WBC 4.4  HGB 8.8*  HCT 27.9*  PLT 309   ------------------------------------------------------------------------------------------------------------------  Chemistries  Recent Labs  Lab 11/28/17 2152 11/30/17 0406  NA 147* 148*  K 4.9 3.8  CL 116* 123*  CO2 20* 20*  GLUCOSE 159* 92  BUN 30* 24*  CREATININE 1.81* 1.25*  CALCIUM 9.4 7.9*  AST 27  --   ALT 10*  --   ALKPHOS 75  --   BILITOT 0.6  --    ------------------------------------------------------------------------------------------------------------------  Cardiac Enzymes Recent Labs  Lab 11/29/17 0423 11/29/17 1007 11/29/17 1554  TROPONINI 0.05* 0.04* 0.04*   ------------------------------------------------------------------------------------------------------------------  RADIOLOGY:  Ct Abdomen Pelvis Wo Contrast  Result Date: 11/28/2017 CLINICAL DATA:  Weakness. Recurrent vomiting and diarrhea today. History of breast cancer, gastric volvulus, splenectomy. EXAM: CT ABDOMEN AND PELVIS WITHOUT CONTRAST TECHNIQUE: Multidetector CT imaging of the abdomen and pelvis was performed following the standard protocol without IV contrast. COMPARISON:  CT abdomen and pelvis June 08, 2015 and abdominal radiograph October 18, 2017 FINDINGS: LOWER CHEST:  Bilateral lower lobe atelectasis. Borderline cardiomegaly. Moderate coronary artery  calcification. No pericardial effusion. HEPATOBILIARY: Normal. PANCREAS: Atrophic, nonacute. SPLEEN: Normal. ADRENALS/URINARY TRACT: Ptotic right kidney. Kidneys demonstrate normal size and morphology. Punctate RIGHT lower pole nephrolithiasis. No hydronephrosis; limited assessment for renal masses on this nonenhanced examination. 12 mm RIGHT lower pole cyst. Stable 2.9 cm homogeneously hypodense cyst upper pole LEFT kidney. Subcentimeter exophytic cyst lower pole LEFT kidney. The unopacified ureters are normal in course and caliber. Urinary bladder is well distended and unremarkable. Normal adrenal glands. STOMACH/BOWEL: Large hiatal hernia with the majority of the stomach in the chest. Severe descending and sigmoid colonic diverticulosis with superimposed short segment of proximal sigmoid bowel wall thickening and pericolonic inflammation. Small and large bowel air-fluid levels. Small bowel are normal in course and caliber without inflammatory changes, sensitivity decreased by lack of enteric contrast. Normal appendix. VASCULAR/LYMPHATIC: Moderate calcific atherosclerosis, tortuous aorta. No lymphadenopathy by CT size criteria. REPRODUCTIVE: Normal. OTHER: No intraperitoneal free fluid or free air. MUSCULOSKELETAL: Non-acute. Osteopenia. Broad thoracolumbar dextroscoliosis, status post L1 and L2 vertebral body cement augmentation without further height loss. Stable linear sclerosis in central sacrum, possible old insufficiency injury. Anterior abdominal wall ligamentous laxity. Multiple small to moderate fat containing supraumbilical ventral hernias. IMPRESSION: 1. Acute sigmoid diverticulitis without complication. Mild enteritis. 2. Large hiatal hernia. 3. Punctate nonobstructing RIGHT nephrolithiasis. Aortic Atherosclerosis (ICD10-I70.0). Electronically Signed   By: Awilda Metro M.D.   On: 11/28/2017 21:13     ASSESSMENT AND PLAN:   83 year old female with a history of essential hypertension  hypothyroidism who presented with abdominal pain.   1.  Acute sigmoid diverticulitis: Patient symptoms are resolving. Surgical evaluation appreciated. No indication for surgery. Continue ciprofloxacin and Flagyl. Advance diet.  2.  Essential hypertension: Continue lisinopril and metoprolol  3.  Hypothyroidism: Continue Synthroid  4.Chronic diastolic heart failure: No signs of exacerbation  5. CKD stage III: Creatinine is at baseline  6.  SLE: Continue prednisone, Plaquenil and CellCept     Management plans discussed with the patient and she is in agreement.  CODE STATUS: full  TOTAL TIME TAKING CARE OF THIS PATIENT: 28 minutes.     POSSIBLE D/C tomorrow, DEPENDING ON CLINICAL CONDITION.   Naudia Crosley M.D on 11/30/2017 at 11:08 AM  Between 7am to 6pm - Pager - [phone] After 6pm go to [email or web address] - password [email or web address]  [facility]  Office  [phone]  CC: Primary care physician; Sherlene Shams, MD  Note: This dictation was prepared with Dragon dictation along with smaller phrase technology. Any transcriptional errors that result from this process are unintentional.

## 2017-11-30 NOTE — Progress Notes (Signed)
SURGICAL PROGRESS NOTE (cpt [phone])  Hospital Day(s): 1.   Post op day(s):  Marland Kitchen   Interval History: Patient seen and examined, no acute events or new complaints overnight. Patient reports her pain has continued to improve, though LLQ abdominal pain remains somewhat, but she describes recurrence of her nausea without further emesis. She otherwise says she's been tolerating clear liquids, has been ambulating, and denies fever/chills, CP, or SOB.  Review of Systems:  Constitutional: denies fever, chills  HEENT: denies cough or congestion  Respiratory: denies any shortness of breath  Cardiovascular: denies chest pain or palpitations  Gastrointestinal: abdominal pain, N/V, and bowel function as per interval history Genitourinary: denies burning with urination or urinary frequency Musculoskeletal: denies pain, decreased motor or sensation Integumentary: denies any other rashes or skin discolorations Neurological: denies HA or vision/hearing changes   Vital signs in last 24 hours: [min-max] current  Temp:  [98.1 F (36.7 C)-98.2 F (36.8 C)] 98.1 F (36.7 C) (03/14 0554) Pulse Rate:  [66-74] 66 (03/14 0554) Resp:  [16-19] 17 (03/14 0554) BP: (100-110)/(53-61) 110/53 (03/14 0554) SpO2:  [91 %-96 %] 96 % (03/14 0554) Weight:  [129 lb (58.5 kg)] 129 lb (58.5 kg) (03/14 0111)     Height: 5\' 1"  (154.9 cm) Weight: 129 lb (58.5 kg) BMI (Calculated): 24.39   Intake/Output this shift:  Total I/O In: -  Out: 250 [Urine:250]   Intake/Output last 2 shifts:  @IOLAST2SHIFTS @   Physical Exam:  Constitutional: alert, cooperative and no distress  HENT: normocephalic without obvious abnormality  Eyes: PERRL, EOM's grossly intact and symmetric  Neuro: CN II - XII grossly intact and symmetric without deficit  Respiratory: breathing non-labored at rest  Cardiovascular: regular rate and sinus rhythm  Gastrointestinal: soft and non-distended with mild-/moderate- LLQ abdominal tenderness to  palpation Musculoskeletal: UE and LE FROM, no edema or wounds, motor and sensation grossly intact, NT   Labs:  CBC Latest Ref Rng & Units 11/30/2017 11/28/2017 05/26/2017  WBC 3.6 - 11.0 K/uL 4.4 6.4 8.1  Hemoglobin 12.0 - 16.0 g/dL 8.8(L) 11.7(L) 10.7(L)  Hematocrit 35.0 - 47.0 % 27.9(L) 35.5 32.4(L)  Platelets 150 - 440 K/uL 309 349 333   CMP Latest Ref Rng & Units 11/30/2017 11/28/2017 11/20/2017  Glucose 65 - 99 mg/dL 92 159(H) -  BUN 6 - 20 mg/dL 24(H) 30(H) 27  Creatinine 0.44 - 1.00 mg/dL 1.25(H) 1.81(H) 1.75(H)  Sodium 135 - 145 mmol/L 148(H) 147(H) -  Potassium 3.5 - 5.1 mmol/L 3.8 4.9 -  Chloride 101 - 111 mmol/L 123(H) 116(H) -  CO2 22 - 32 mmol/L 20(L) 20(L) -  Calcium 8.9 - 10.3 mg/dL 7.9(L) 9.4 -  Total Protein 6.5 - 8.1 g/dL - 7.5 -  Total Bilirubin 0.3 - 1.2 mg/dL - 0.6 -  Alkaline Phos 38 - 126 U/L - 75 -  AST 15 - 41 U/L - 27 -  ALT 14 - 54 U/L - 10(L) -   Imaging studies: No new pertinent imaging studies   Assessment/Plan: (ICD-10's: K40.32) 83 y.o. female with recurrent acute sigmoid colonic diverticulitis without abscess, complicated by pertinent comorbidities including HTN, HLD, CHF, chronic Coumadin anticoagulation for atrial fibrillation, pulmonary HTN, home supplemental oxygen qhs, CKD, hypothyroidism, chronic low-dose steroids for lupus, chronic constipation attributed to chronic narcotics for chronic lower back pain attributed to scoliosis with sciatica, GERD with large hiatal hernia (history of gastric volvulus), osteopenia, peripheral neuropathy, history of DVT and history of breast cancer.              -  agree with IV antibiotics             - advance to soft (low-residue) diet             - pain control prn (minimize narcotics as able)             - continue to monitor abdominal exam and bowel function             - medical management of comorbidities as per primary medical team             - if recovers from with non-operative management, will require  effective bowel regimen to minimize constipation             - currently no indication for surgical intervention, but will continue to follow along with primary team  - anticipate discharge planning for likely discharge home tomorrow             - ambulation encouraged  All of the above findings and recommendations were discussed with the patient and her family, and all of patient's and her family's questions were answered to her expressed satisfaction.  Thank you for the opportunity to participate in this patient's care.  -- Marilynne Drivers Rosana Hoes, MD, [place]: [facility] - Partnering for exceptional care. Office: [phone]

## 2017-12-01 ENCOUNTER — Inpatient Hospital Stay: Payer: Medicare Other

## 2017-12-01 LAB — PROTIME-INR
INR: 2.94
Prothrombin Time: 30.4 seconds — ABNORMAL HIGH (ref 11.4–15.2)

## 2017-12-01 MED ORDER — ACETAMINOPHEN 650 MG RE SUPP: 650.0000 mg | Freq: Four times a day (QID) | RECTAL | Status: DC | PRN

## 2017-12-01 MED ORDER — ACETAMINOPHEN 325 MG PO TABS: 650.0000 mg | ORAL_TABLET | Freq: Four times a day (QID) | ORAL | Status: DC | PRN

## 2017-12-01 MED ORDER — WARFARIN SODIUM 2 MG PO TABS
2.0000 mg | ORAL_TABLET | Freq: Once | ORAL | Status: AC
  Administered 2017-12-01: 2 mg via ORAL
  Filled 2017-12-01: qty 1

## 2017-12-01 MED ORDER — WARFARIN - PHARMACIST DOSING INPATIENT
Freq: Every day | Status: DC
  Administered 2017-12-01: 18:00:00

## 2017-12-01 MED ORDER — CIPROFLOXACIN HCL 500 MG PO TABS
500.0000 mg | ORAL_TABLET | Freq: Two times a day (BID) | ORAL | Status: DC
  Administered 2017-12-01 – 2017-12-02 (×2): 500 mg via ORAL
  Filled 2017-12-01 (×2): qty 1

## 2017-12-01 NOTE — Progress Notes (Signed)
Pharmacy Antibiotic Note  Diana [name] is a 83 y.o. female admitted on 11/28/2017 with intra-abdominal infection.  Pharmacy has been consulted for ciprofloxacin dosing.  Also on metronidazole  Plan: Day 3- Cipro 500 mg PO q18h.  Renal fxn has improved. Will adjust cipro to 500 mg PO q12h.   Height: 5\' 1"  (154.9 cm) Weight: 129 lb (58.5 kg) IBW/kg (Calculated) : 47.8  Temp (24hrs), Avg:98.5 F (36.9 C), Min:98.4 F (36.9 C), Max:98.6 F (37 C)  Recent Labs  Lab 11/28/17 2152 11/30/17 0406  WBC 6.4 4.4  CREATININE 1.81* 1.25*    Estimated Creatinine Clearance: 29.5 mL/min (A) (by C-G formula based on SCr of 1.25 mg/dL (H)).    Allergies  Allergen Reactions  . Tramadol Other (See Comments) and Rash    Rash  . Amoxicillin Other (See Comments)    Reaction:  Stomach cramps   . Cefuroxime Itching  . Contrast Media [Iodinated Diagnostic Agents] Hives  . Metrizamide Hives  . Morphine And Related     Reaction: face flushed    . Propoxyphene     Stomach cramp  . Cephalexin Rash  . Sulfa Antibiotics Rash    REACTION: Rash  . Sulfonamide Derivatives Rash  . Tramadol Hcl Rash  . Venlafaxine Rash    Thank you for allowing pharmacy to be a part of this patient's care.  Chinita Greenland PharmD Clinical Pharmacist 12/01/2017

## 2017-12-01 NOTE — Evaluation (Signed)
Physical Therapy Evaluation Patient Details Name: Diana [name] MRN: [id number] DOB: 02-07-1935 Today's Date: 12/01/2017   History of Present Illness  The patient is an 83 year old female with past medical history of pulmonary hypertension, lupus, hypertension and chronic kidney disease presents to the emergency department complaining of weakness following innumerable episodes of nonbloody nonbilious emesis and nonbloody diarrhea.  Admitted for acute diverticulitis and dehydration.  Clinical Impression  Pt is an 83 y.o,. Female who lives in a one story home with her child.  Pt reports being independent without AD at baseline.  She was in bed upon PT arrival and reports having 6/10 pain in R hip and abdomen but that the pain has been decreasing steadily to a 3/10.  Pt is able to perform bed mobility independently and stand without physical assist.  Pt ambulated 30 ft in room around bed to recliner and PT provided supervision.  Pt does present with postural deviations related to scoliosis and slow gait which indicate a fall risk with dynamic mobility.  Pt presented with generalized weakness of UE and LE and reported no sensation loss in bilateral LE.  Pt TUG time indicates a risk for falls with dynamic activity.  PT discussed use of RW for longer distance ambulation if pt needs it and pt stated that she has a RW at home.  Pt will continue to benefit from skilled PT with focus on strength, functional mobility and proper use of AD.    Follow Up Recommendations Home health PT    Equipment Recommendations       Recommendations for Other Services       Precautions / Restrictions Precautions Precautions: Fall Restrictions Weight Bearing Restrictions: No      Mobility  Bed Mobility Overal bed mobility: Independent                Transfers Overall transfer level: Needs assistance   Transfers: Sit to/from Stand Sit to Stand: Supervision         General transfer comment: Pt used  no AD but pt required supervision due to recent dx and pt reported pain level.  Ambulation/Gait Ambulation/Gait assistance: Supervision Ambulation Distance (Feet): 20 Feet       Gait velocity interpretation: Below normal speed for age/gender General Gait Details: Pt ambulated in room without AD presenting with moderate foot clearance, flexed posture and slow gait.  PT discussed use of RW in the case that pt would need if for fall prevention if pt becomes fatigued and pt stated that she has one at home.  Stairs            Wheelchair Mobility    Modified Rankin (Stroke Patients Only)       Balance Overall balance assessment: Independent                               Standardized Balance Assessment Standardized Balance Assessment : TUG: Timed Up and Go Test     Timed Up and Go Test TUG: Normal TUG Normal TUG (seconds): 16     Pertinent Vitals/Pain Pain Assessment: 0-10 Pain Score: 6  Pain Location: R hip and lower abdomen    Home Living Family/patient expects to be discharged to:: Private residence Living Arrangements: Children Available Help at Discharge: Available PRN/intermittently Type of Home: House Home Access: Level entry     Home Layout: One level Home Equipment: Grab bars - toilet;Grab bars - tub/shower;Walker - 2  wheels      Prior Function Level of Independence: Independent         Comments: Does not drive     Hand Dominance   Dominant Hand: Right    Extremity/Trunk Assessment   Upper Extremity Assessment Upper Extremity Assessment: Generalized weakness    Lower Extremity Assessment Lower Extremity Assessment: Overall WFL for tasks assessed(Roughly 3+ to 4-/5 bilaterally)    Cervical / Trunk Assessment Cervical / Trunk Assessment: Kyphotic(Pt presents with scoliosis which she reports is the cause of her hip pain.)  Communication   Communication: No difficulties  Cognition Arousal/Alertness: Awake/alert Behavior  During Therapy: WFL for tasks assessed/performed Overall Cognitive Status: Within Functional Limits for tasks assessed                                        General Comments      Exercises     Assessment/Plan    PT Assessment Patient needs continued PT services  PT Problem List Decreased strength;Decreased balance;Decreased knowledge of use of DME       PT Treatment Interventions DME instruction;Therapeutic activities;Gait training;Therapeutic exercise;Patient/family education;Stair training;Balance training;Functional mobility training;Neuromuscular re-education    PT Goals (Current goals can be found in the Care Plan section)  Acute Rehab PT Goals Patient Stated Goal: To return home and continue normal level of activity PT Goal Formulation: With patient Time For Goal Achievement: 12/15/17 Potential to Achieve Goals: Good    Frequency Min 2X/week   Barriers to discharge        Co-evaluation               AM-PAC PT "6 Clicks" Daily Activity  Outcome Measure Difficulty turning over in bed (including adjusting bedclothes, sheets and blankets)?: None Difficulty moving from lying on back to sitting on the side of the bed? : A Little Difficulty sitting down on and standing up from a chair with arms (e.g., wheelchair, bedside commode, etc,.)?: A Little Help needed moving to and from a bed to chair (including a wheelchair)?: A Little Help needed walking in hospital room?: A Little Help needed climbing 3-5 steps with a railing? : A Little 6 Click Score: 19    End of Session Equipment Utilized During Treatment: Gait belt Activity Tolerance: Patient tolerated treatment well Patient left: in chair;with call bell/phone within reach;with chair alarm set Nurse Communication: Mobility status(Leg rest on recliner is catching when raised .  Appropriate dept was notified by RN.) PT Visit Diagnosis: Unsteadiness on feet (R26.81);Muscle weakness (generalized)  (M62.81)    Time: 2130-8657 PT Time Calculation (min) (ACUTE ONLY): 25 min   Charges:   PT Evaluation $PT Eval Low Complexity: 1 Low     PT G Codes:   PT G-Codes **NOT FOR INPATIENT CLASS** Functional Assessment Tool Used: AM-PAC 6 Clicks Basic Mobility   Glenetta Hew, PT, DPT   Glenetta Hew 12/01/2017, 3:56 PM

## 2017-12-01 NOTE — Progress Notes (Signed)
[employer] - [place] at [facility]   PATIENT NAME: Diana [name]    MR#:  [id number]  DATE OF BIRTH:  23-Aug-1935  SUBJECTIVE:  Patient complaining of severe abdominal pain this morning She is able to tolerate her diet REVIEW OF SYSTEMS:    Review of Systems  Constitutional: Negative for fever, chills weight loss HENT: Negative for ear pain, nosebleeds, congestion, facial swelling, rhinorrhea, neck pain, neck stiffness and ear discharge.   Respiratory: Negative for cough, shortness of breath, wheezing  Cardiovascular: Negative for chest pain, palpitations and leg swelling.  Gastrointestinal: Positive diarrhea and abdominal pain Genitourinary: Negative for dysuria, urgency, frequency, hematuria Musculoskeletal: Negative for back pain or joint pain Neurological: Negative for dizziness, seizures, syncope, focal weakness,  numbness and headaches.  Hematological: Does not bruise/bleed easily.  Psychiatric/Behavioral: Negative for hallucinations, confusion, dysphoric mood    Tolerating Diet: yes      DRUG ALLERGIES:   Allergies  Allergen Reactions  . Tramadol Other (See Comments) and Rash    Rash  . Amoxicillin Other (See Comments)    Reaction:  Stomach cramps   . Cefuroxime Itching  . Contrast Media [Iodinated Diagnostic Agents] Hives  . Metrizamide Hives  . Morphine And Related     Reaction: face flushed    . Propoxyphene     Stomach cramp  . Cephalexin Rash  . Sulfa Antibiotics Rash    REACTION: Rash  . Sulfonamide Derivatives Rash  . Tramadol Hcl Rash  . Venlafaxine Rash    VITALS:  Blood pressure (!) 145/75, pulse 85, temperature 98.6 F (37 C), temperature source Oral, resp. rate 20, height 5\' 1"  (1.549 m), weight 58.5 kg (129 lb), SpO2 92 %.  PHYSICAL EXAMINATION:  Constitutional: Appears well-developed and well-nourished. No distress. HENT: Normocephalic. Marland Kitchen Oropharynx is clear and moist.  Eyes: Conjunctivae and EOM are normal. PERRLA,  no scleral icterus.  Neck: Normal ROM. Neck supple. No JVD. No tracheal deviation. CVS: RRR, S1/S2 +, no murmurs, no gallops, no carotid bruit.  Pulmonary: Effort and breath sounds normal, no stridor, rhonchi, wheezes, rales.  Abdominal: Normal bowel sounds with tenderness on palpation no rebound or guarding.  Musculoskeletal: Normal range of motion. No edema and no tenderness.  Neuro: Alert. CN 2-12 grossly intact. No focal deficits. Skin: Skin is warm and dry. No rash noted. Psychiatric: Anxious     LABORATORY PANEL:   CBC Recent Labs  Lab 11/30/17 0406  WBC 4.4  HGB 8.8*  HCT 27.9*  PLT 309   ------------------------------------------------------------------------------------------------------------------  Chemistries  Recent Labs  Lab 11/28/17 2152 11/30/17 0406  NA 147* 148*  K 4.9 3.8  CL 116* 123*  CO2 20* 20*  GLUCOSE 159* 92  BUN 30* 24*  CREATININE 1.81* 1.25*  CALCIUM 9.4 7.9*  AST 27  --   ALT 10*  --   ALKPHOS 75  --   BILITOT 0.6  --    ------------------------------------------------------------------------------------------------------------------  Cardiac Enzymes Recent Labs  Lab 11/29/17 0423 11/29/17 1007 11/29/17 1554  TROPONINI 0.05* 0.04* 0.04*   ------------------------------------------------------------------------------------------------------------------  RADIOLOGY:  No results found.   ASSESSMENT AND PLAN:   83 year old female with a history of essential hypertension hypothyroidism who presented with abdominal pain.   1.  Acute sigmoid diverticulitis: Due to acute pain I will obtain stat CT noncontrast to evaluate for perforation given her abdominal exam with diffuse tenderness. Continue ciprofloxacin and Flagyl for the treatment of acute sigmoid diverticulitis Patient was having diarrhea and C. difficile was negative 2.  Essential hypertension: Continue lisinopril and metoprolol  3.  Hypothyroidism: Continue  Synthroid  4.Chronic diastolic heart failure: No signs of exacerbation  5. CKD stage III: Creatinine is at baseline  6.  SLE: Continue prednisone, Plaquenil and CellCept  7.  UTI: Continue ciprofloxacin: Urine culture shows multiple species present suggest recollection  8.  History of paroxysamal  atrial fibrillation currently in normal sinus rhythm: PT consultation needed for discharge planning Coumadin as per pharmacy   Management plans discussed with the patient and she is in agreement.  CODE STATUS: full  TOTAL TIME TAKING CARE OF THIS PATIENT: 28 minutes.     POSSIBLE D/C 1-2 days DEPENDING ON CLINICAL CONDITION.   Shreyas Piatkowski M.D on 12/01/2017 at 11:19 AM  Between 7am to 6pm - Pager - [phone] After 6pm go to [email or web address] - password [email or web address]  [facility]  Office  [phone]  CC: Primary care physician; Sherlene Shams, MD  Note: This dictation was prepared with Dragon dictation along with smaller phrase technology. Any transcriptional errors that result from this process are unintentional.

## 2017-12-01 NOTE — Care Management Important Message (Signed)
Important Message  Patient Details  Name: Diana [name] MRN: [id number] Date of Birth: [birth year]   Medicare Important Message Given:  Yes    Beverly Sessions, RN 12/01/2017, 4:27 PM

## 2017-12-01 NOTE — Care Management (Signed)
Patient admitted from home with Acute sigmoid diverticulitis.  Lives at home with son.  PCP [place].  Patient states that her son, sister in law, and niece provide transportation.  PT has assessed patient and recommends home health PT.  Patient states that she has  RW, and BSC in the home.  Patient is currently declining home health PT.  Patient states that she feels like mobility she is at her baseline.  Patient states that at baseline she is independent, does her own cooking, cleaning, and laundry.  Patient states that she had had home health in the past (can not recall the agency) and she does not feel like it is indicated at this time.  RNCM notified patient that should she change her mind it can be arranged prior to discharge or her PCP can write orders.  RNCM following

## 2017-12-01 NOTE — Progress Notes (Signed)
ANTICOAGULATION CONSULT NOTE - follow up [place] for warfarin  Indication: atrial fibrillation  Allergies  Allergen Reactions  . Tramadol Other (See Comments) and Rash    Rash  . Amoxicillin Other (See Comments)    Reaction:  Stomach cramps   . Cefuroxime Itching  . Contrast Media [Iodinated Diagnostic Agents] Hives  . Metrizamide Hives  . Morphine And Related     Reaction: face flushed    . Propoxyphene     Stomach cramp  . Cephalexin Rash  . Sulfa Antibiotics Rash    REACTION: Rash  . Sulfonamide Derivatives Rash  . Tramadol Hcl Rash  . Venlafaxine Rash    Patient Measurements: Height: 5\' 1"  (154.9 cm) Weight: 129 lb (58.5 kg) IBW/kg (Calculated) : 47.8   Vital Signs: Temp: 98.6 F (37 C) (03/15 0447) Temp Source: Oral (03/15 0447) BP: 122/60 (03/15 0447) Pulse Rate: 77 (03/15 0447)  Labs: Recent Labs    11/28/17 2152 11/29/17 0423 11/29/17 1007 11/29/17 1554 11/30/17 0406 12/01/17 0601  HGB 11.7*  --   --   --  8.8*  --   HCT 35.5  --   --   --  27.9*  --   PLT 349  --   --   --  309  --   LABPROT  --  33.1*  --   --  32.4* 30.4*  INR  --  3.28  --   --  3.19 2.94  CREATININE 1.81*  --   --   --  1.25*  --   TROPONINI 0.03* 0.05* 0.04* 0.04*  --   --     Estimated Creatinine Clearance: 29.5 mL/min (A) (by C-G formula based on SCr of 1.25 mg/dL (H)).   Medical History: Past Medical History:  Diagnosis Date  . Acute glomerulonephritis with other specified pathological lesion in kidney in disease classified elsewhere(580.81)   . Alveolar aeration decreased   . Anemia   . Arthritis   . Atrial fibrillation ([place])   . Breast cancer ([place]) 06/03/2014   positive, radiation  . Cancer (HCC)    Breast  . CHF (congestive heart failure) ([place])   . Diaphragmatic hernia   . DVT (deep venous thrombosis) ([place])   . Dysrhythmia   . Environmental allergies   . Esophageal reflux   . Feeding problem    FEEDING TUBE  . Hiatal hernia   .  Hyperlipidemia   . Hypertension   . Lower extremity edema   . Lupus (systemic lupus erythematosus) ([place])   . Neuropathy   . Osteopenia   . Oxygen deficiency    USES HS  . Peripheral neuropathy, hereditary/idiopathic   . Personal history of radiation therapy   . Pneumonia   . Primary pulmonary hypertension ([place])   . Pulmonary hypertension ([place])   . Renal insufficiency   . Sciatica   . Shingles   . Shingles (herpes zoster) polyneuropathy March 2013   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  . Shortness of breath dyspnea   . Unspecified hypothyroidism   . Unspecified menopausal and postmenopausal disorder   . Urinary incontinence without sensory awareness   . Vitamin B deficiency     Medications:  Scheduled:  . calcium-vitamin D  1 tablet Oral Daily  . ciprofloxacin  500 mg Oral Q18H  . [START ON 12/18/2017] cyanocobalamin  1,000 mcg Intramuscular Q30 days  . docusate sodium  100 mg Oral BID  . hydroxychloroquine  200 mg Oral Daily  . letrozole  2.5 mg Oral Daily  . levothyroxine  150 mcg Oral QAC breakfast  . linaclotide  145 mcg Oral QAC breakfast  . metoCLOPramide  10 mg Oral TID AC & HS  . metoprolol succinate  12.5 mg Oral Daily  . metroNIDAZOLE  500 mg Oral Q8H  . mycophenolate  1,000 mg Oral BID  . nystatin cream  1 application Topical BID  . pantoprazole  40 mg Oral BID  . predniSONE  5 mg Oral Daily  . sucralfate  1 g Oral QID  . tadalafil (PAH)  40 mg Oral Daily  . vitamin C  1,000 mg Oral Daily    Assessment: Patient admitted for N/V found to have acute diverticulitis on CT and is started on cipro/flagyl. Patient takes warfarin PTA for anticoagulation for chronic afib. Warfarin PTA dose: Warfarin 3 mg every other day Warfarin 4 mg every other day However, according to daughter not sure what days the patient takes which dose.  03/13 (baseline) INR 3.28 No warfarin 03/14 INR  3.19    No warfarin  3/15   INR  2.94  Goal of Therapy:  INR 2-3 Monitor platelets  by anticoagulation protocol: Yes   Plan:  Patient on cipro/metronidazole- watch INR d/t interaction with warfarin. INR now < 3.0. Will order Warfarin 2 mg po x 1 tonight. Will monitor daily INRs and CBC's and will adjust doses accordingly.  Chinita Greenland PharmD Clinical Pharmacist 12/01/2017

## 2017-12-01 NOTE — Care Management Important Message (Signed)
Important Message  Patient Details  Name: Diana [name] MRN: [id number] Date of Birth: Jun 12, 1935   Medicare Important Message Given:  Yes    Beverly Sessions, RN 12/01/2017, 4:27 PM

## 2017-12-01 NOTE — Progress Notes (Signed)
SURGICAL PROGRESS NOTE (cpt [phone])  Hospital Day(s): 2.   Post op day(s):  Marland Kitchen   Interval History: Patient seen and examined, no acute events, continues to complain of nausea with mild LLQ abdominal pain. Patient otherwise reports tolerating diet, +flatus, and loose BM's (since starting Linzess, prior to which she describes a long history of constipation for which she's only previously taken Dulcolax), denies emesis, fever/chills, CP, or SOB.  Review of Systems:  Constitutional: denies fever, chills  HEENT: denies cough or congestion  Respiratory: denies any shortness of breath  Cardiovascular: denies chest pain or palpitations  Gastrointestinal: abdominal pain, N/V, and bowel function as per interval history Genitourinary: denies burning with urination or urinary frequency Musculoskeletal: denies pain, decreased motor or sensation Integumentary: denies any other rashes or skin discolorations Neurological: denies HA or vision/hearing changes   Vital signs in last 24 hours: [min-max] current  Temp:  [98.4 F (36.9 C)-98.6 F (37 C)] 98.6 F (37 C) (03/15 0447) Pulse Rate:  [77-99] 77 (03/15 0447) Resp:  [18-22] 20 (03/15 0447) BP: (94-122)/(53-62) 122/60 (03/15 0447) SpO2:  [91 %-94 %] 92 % (03/15 0447)     Height: 5\' 1"  (154.9 cm) Weight: 129 lb (58.5 kg) BMI (Calculated): 24.39   Intake/Output this shift:  No intake/output data recorded.   Intake/Output last 2 shifts:  @IOLAST2SHIFTS @   Physical Exam:  Constitutional: alert, cooperative and no distress  HENT: normocephalic without obvious abnormality  Eyes: PERRL, EOM's grossly intact and symmetric  Neuro: CN II - XII grossly intact and symmetric without deficit  Respiratory: breathing non-labored at rest  Cardiovascular: regular rate and sinus rhythm  Gastrointestinal: soft and non-distended with mild LLQ > suprapubic tenderness to palpation Musculoskeletal: UE and LE FROM, no edema or wounds, motor and sensation  grossly intact, NT   Labs:  CBC Latest Ref Rng & Units 11/30/2017 11/28/2017 05/26/2017  WBC 3.6 - 11.0 K/uL 4.4 6.4 8.1  Hemoglobin 12.0 - 16.0 g/dL 8.8(L) 11.7(L) 10.7(L)  Hematocrit 35.0 - 47.0 % 27.9(L) 35.5 32.4(L)  Platelets 150 - 440 K/uL 309 349 333   CMP Latest Ref Rng & Units 11/30/2017 11/28/2017 11/20/2017  Glucose 65 - 99 mg/dL 92 159(H) -  BUN 6 - 20 mg/dL 24(H) 30(H) 27  Creatinine 0.44 - 1.00 mg/dL 1.25(H) 1.81(H) 1.75(H)  Sodium 135 - 145 mmol/L 148(H) 147(H) -  Potassium 3.5 - 5.1 mmol/L 3.8 4.9 -  Chloride 101 - 111 mmol/L 123(H) 116(H) -  CO2 22 - 32 mmol/L 20(L) 20(L) -  Calcium 8.9 - 10.3 mg/dL 7.9(L) 9.4 -  Total Protein 6.5 - 8.1 g/dL - 7.5 -  Total Bilirubin 0.3 - 1.2 mg/dL - 0.6 -  Alkaline Phos 38 - 126 U/L - 75 -  AST 15 - 41 U/L - 27 -  ALT 14 - 54 U/L - 10(L) -   Imaging studies: No new pertinent imaging studies   Assessment/Plan: (ICD-10's:K57.32) 83 y.o.femalewith recurrent acute sigmoid colonic diverticulitis without abscess, complicated by pertinent comorbidities includingHTN, HLD, CHF, chronic Coumadin anticoagulation for atrial fibrillation, pulmonary HTN, home supplemental oxygen qhs, CKD, hypothyroidism, chronic low-dose steroids for lupus, chronic constipation attributed to chronic narcotics for chronic lower back pain attributed to scoliosis with sciatica, GERD with large hiatal hernia (history of gastric volvulus), osteopenia, peripheral neuropathy, history of DVT and history of breast cancer.  - IV --> PO antibiotics - soft (low-residue) diet x 6 weeks - pain control prn (minimize narcotics as able) - continue to monitor abdominal exam  and bowel function - medical management of comorbidities as per primary medical team - requires an effective bowel regimen to minimize constipation             - discharge planning as per medical team - ambulation  encouraged  All of the above findings and recommendations were discussed with the patient and her family, and all of patient's and her family's questions were answered to her expressed satisfaction.  Thank you for the opportunity to participate in this patient's care.  -- Marilynne Drivers Rosana Hoes, MD, [place]: [facility] - Partnering for exceptional care. Office: [phone]

## 2017-12-02 ENCOUNTER — Telehealth: Payer: Self-pay | Admitting: Family Medicine

## 2017-12-02 LAB — BASIC METABOLIC PANEL
Anion gap: 5 (ref 5–15)
BUN: 10 mg/dL (ref 6–20)
CHLORIDE: 123 mmol/L — AB (ref 101–111)
CO2: 20 mmol/L — AB (ref 22–32)
Calcium: 8.1 mg/dL — ABNORMAL LOW (ref 8.9–10.3)
Creatinine, Ser: 0.97 mg/dL (ref 0.44–1.00)
GFR calc Af Amer: >60 mL/min (ref >60)
GFR calc non Af Amer: 54 mL/min — ABNORMAL LOW (ref >60)
GLUCOSE: 106 mg/dL — AB (ref 65–99)
POTASSIUM: 3.3 mmol/L — AB (ref 3.5–5.1)
Sodium: 148 mmol/L — ABNORMAL HIGH (ref 135–145)

## 2017-12-02 LAB — PROTIME-INR
INR: 3.11
Prothrombin Time: 31.8 seconds — ABNORMAL HIGH (ref 11.4–15.2)

## 2017-12-02 MED ORDER — WARFARIN SODIUM 1 MG PO TABS: 1.0000 mg | ORAL_TABLET | Freq: Once | ORAL | Status: DC

## 2017-12-02 MED ORDER — CIPROFLOXACIN HCL 500 MG PO TABS: 500.0000 mg | ORAL_TABLET | Freq: Two times a day (BID) | ORAL | 0 refills | Status: DC

## 2017-12-02 MED ORDER — METRONIDAZOLE 500 MG PO TABS: 500.0000 mg | ORAL_TABLET | Freq: Three times a day (TID) | ORAL | 0 refills | Status: DC

## 2017-12-02 MED ORDER — PROBIOTIC 250 MG PO CAPS: 1.0000 | ORAL_CAPSULE | Freq: Every day | ORAL | Status: AC

## 2017-12-02 MED ORDER — POTASSIUM CHLORIDE CRYS ER 20 MEQ PO TBCR
40.0000 meq | EXTENDED_RELEASE_TABLET | Freq: Once | ORAL | Status: AC
  Administered 2017-12-02: 40 meq via ORAL
  Filled 2017-12-02: qty 2

## 2017-12-02 NOTE — Telephone Encounter (Signed)
Georgina Snell from home health is calling to report INR of 3.1. According to caller, patient has been discharged from hospital, diagnosis of diverticulitis and currently on antibiotic treatment. History of atrial fibrillation. Coumadin was held. INR was ordered for tomorrow but because Sunday [facility] will not be available to do it, so caller is asking if INR can be Monday instead.  Plan: Given the fact INR is mildly elevated today and she is still on antibiotics, I think it is appropriate to continue holding Coumadin and to recheck INR in 2 days.  Betty Martinique, MD

## 2017-12-02 NOTE — Progress Notes (Signed)
ANTICOAGULATION CONSULT NOTE - follow up [place] for warfarin  Indication: atrial fibrillation  Allergies  Allergen Reactions  . Tramadol Other (See Comments) and Rash    Rash  . Amoxicillin Other (See Comments)    Reaction:  Stomach cramps   . Cefuroxime Itching  . Contrast Media [Iodinated Diagnostic Agents] Hives  . Metrizamide Hives  . Morphine And Related     Reaction: face flushed    . Propoxyphene     Stomach cramp  . Cephalexin Rash  . Sulfa Antibiotics Rash    REACTION: Rash  . Sulfonamide Derivatives Rash  . Tramadol Hcl Rash  . Venlafaxine Rash    Patient Measurements: Height: 5\' 1"  (154.9 cm) Weight: 133 lb 6.1 oz (60.5 kg) IBW/kg (Calculated) : 47.8   Vital Signs: Temp: 98.1 F (36.7 C) (03/16 0433) Temp Source: Oral (03/16 0433) BP: 125/66 (03/16 0433) Pulse Rate: 79 (03/16 0433)  Labs: Recent Labs    11/29/17 1007 11/29/17 1554 11/30/17 0406 12/01/17 0601 12/02/17 0548  HGB  --   --  8.8*  --   --   HCT  --   --  27.9*  --   --   PLT  --   --  309  --   --   LABPROT  --   --  32.4* 30.4* 31.8*  INR  --   --  3.19 2.94 3.11  CREATININE  --   --  1.25*  --  0.97  TROPONINI 0.04* 0.04*  --   --   --     Estimated Creatinine Clearance: 38.6 mL/min (by C-G formula based on SCr of 0.97 mg/dL).   Assessment: Patient admitted for N/V found to have acute diverticulitis on CT and is started on cipro/flagyl. Patient takes warfarin PTA for anticoagulation for chronic afib.  Warfarin home dose: Warfarin 3 mg every other day Warfarin 4 mg every other day However, according to daughter not sure what days the patient takes which dose.  Dosing History: Date INR Dose 3/13 3.3 Held 3/14 3.2 Held 3/15 2.9 2 mg 3./16 3.1  Goal of Therapy:  INR 2-3 Monitor platelets by anticoagulation protocol: Yes   Plan:  Patient is receiving antibiotics (metronidazole + ciprofloxacin) which significantly interact with warfarin and can  result in elevated INR. It is recommended to reduce warfarin dose in anticipation of drug interaction. INR = 3.1 is only slightly above therapeutic range. Will order reduced dose of warfarin 1 mg PO this evening.   Will monitor daily INRs and CBC's and will adjust doses accordingly.  Lenis Noon, PharmD, BCPS Clinical Pharmacist 12/02/2017

## 2017-12-02 NOTE — Discharge Summary (Signed)
[employer] [facility] [place] at [facility]   PATIENT NAME: Diana [name]    MR#:  [id number]  DATE OF BIRTH:  08/10/1935  DATE OF ADMISSION:  11/28/2017 ADMITTING PHYSICIAN: Arnaldo Natal, MD  DATE OF DISCHARGE: 12/02/2017   PRIMARY CARE PHYSICIAN: Sherlene Shams, MD    ADMISSION DIAGNOSIS:  Dehydration [E86.0] Diverticulitis [K57.92] Atrial fibrillation with rapid ventricular response (HCC) [I48.91] Urinary tract infection without hematuria, site unspecified [N39.0] Diarrhea, unspecified type [R19.7] Non-intractable vomiting with nausea, unspecified vomiting type [R11.2]  DISCHARGE DIAGNOSIS:  Active Problems:   Acute diverticulitis   SECONDARY DIAGNOSIS:   Past Medical History:  Diagnosis Date  . Acute glomerulonephritis with other specified pathological lesion in kidney in disease classified elsewhere(580.81)   . Alveolar aeration decreased   . Anemia   . Arthritis   . Atrial fibrillation (HCC)   . Breast cancer (HCC) 06/03/2014   positive, radiation  . Cancer (HCC)    Breast  . CHF (congestive heart failure) (HCC)   . Diaphragmatic hernia   . DVT (deep venous thrombosis) (HCC)   . Dysrhythmia   . Environmental allergies   . Esophageal reflux   . Feeding problem    FEEDING TUBE  . Hiatal hernia   . Hyperlipidemia   . Hypertension   . Lower extremity edema   . Lupus (systemic lupus erythematosus) (HCC)   . Neuropathy   . Osteopenia   . Oxygen deficiency    USES HS  . Peripheral neuropathy, hereditary/idiopathic   . Personal history of radiation therapy   . Pneumonia   . Primary pulmonary hypertension (HCC)   . Pulmonary hypertension (HCC)   . Renal insufficiency   . Sciatica   . Shingles   . Shingles (herpes zoster) polyneuropathy March 2013   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  . Shortness of breath dyspnea   . Unspecified hypothyroidism   . Unspecified menopausal and postmenopausal disorder   . Urinary incontinence without sensory  awareness   . Vitamin B deficiency     HOSPITAL COURSE:   83 year old female with a history of essential hypertension hypothyroidism who presented with abdominal pain.   1.  Acute sigmoid diverticulitis: Patient is doing well this morning.  She did have abdominal pain yesterday and a repeat CT scan was performed which is negative for perforation or abscess.  Her abdominal pain has subsided.  She will continue treatment for acute diverticulitis with ciprofloxacin and Flagyl.  She was evaluated by surgery while patient was hospitalized.  She does not need surgery at this point.   She was having some diarrhea due to stool softeners that were given.  C. difficile panel was negative.  She is asked to use probiotics while taking antibiotics.     2.  Essential hypertension: Continue lisinopril and metoprolol  3.  Hypothyroidism: Continue Synthroid  4.Chronic diastolic heart failure: No signs of exacerbation  5. CKD stage III: Creatinine is at baseline  6.  SLE: Continue prednisone, Plaquenil and CellCept  7.  UTI: He may continue ciprofloxacin: Urine culture shows multiple species present suggest recollection  8.  History of paroxysamal  atrial fibrillation currently in normal sinus rhythm: Due to elevated INR upon discharge Coumadin is been discontinued for now.  She will have home health check her INR and sent to PCPs office.  She may restart Coumadin once the INR is within limits to restart.  Since she is on the antibiotics it makes the INR elevated.    DISCHARGE CONDITIONS  AND DIET:   Stable for discharge on heart healthy diet  CONSULTS OBTAINED:  Treatment Team:  Ancil Linsey, MD  DRUG ALLERGIES:   Allergies  Allergen Reactions  . Tramadol Other (See Comments) and Rash    Rash  . Amoxicillin Other (See Comments)    Reaction:  Stomach cramps   . Cefuroxime Itching  . Contrast Media [Iodinated Diagnostic Agents] Hives  . Metrizamide Hives  . Morphine And  Related     Reaction: face flushed    . Propoxyphene     Stomach cramp  . Cephalexin Rash  . Sulfa Antibiotics Rash    REACTION: Rash  . Sulfonamide Derivatives Rash  . Tramadol Hcl Rash  . Venlafaxine Rash    DISCHARGE MEDICATIONS:   Allergies as of 12/02/2017      Reactions   Tramadol Other (See Comments), Rash   Rash   Amoxicillin Other (See Comments)   Reaction:  Stomach cramps    Cefuroxime Itching   Contrast Media [iodinated Diagnostic Agents] Hives   Metrizamide Hives   Morphine And Related    Reaction: face flushed    Propoxyphene    Stomach cramp   Cephalexin Rash   Sulfa Antibiotics Rash   REACTION: Rash   Sulfonamide Derivatives Rash   Tramadol Hcl Rash   Venlafaxine Rash      Medication List    STOP taking these medications   warfarin 3 MG tablet Commonly known as:  COUMADIN   warfarin 4 MG tablet Commonly known as:  COUMADIN     TAKE these medications   ADCIRCA 20 MG tablet Generic drug:  tadalafil (PAH) Take 40 mg by mouth daily.   Calcium Carbonate-Vitamin D 600-400 MG-UNIT tablet Take 1 tablet by mouth daily.   ciprofloxacin 500 MG tablet Commonly known as:  CIPRO Take 1 tablet (500 mg total) by mouth 2 (two) times daily for 9 days.   cyanocobalamin 1000 MCG/ML injection Commonly known as:  (VITAMIN B-12) Inject 1 mL (1,000 mcg total) into the muscle every 30 (thirty) days. Pt uses on the 1st of every month.   hydroxychloroquine 200 MG tablet Commonly known as:  PLAQUENIL Take 200 mg by mouth daily.   Lactulose 20 GM/30ML Soln 30 ml every 4 hours until constipation is relieved   letrozole 2.5 MG tablet Commonly known as:  FEMARA Take 1 tablet (2.5 mg total) by mouth daily.   levothyroxine 150 MCG tablet Commonly known as:  SYNTHROID, LEVOTHROID Take 1 tablet (150 mcg total) by mouth daily.   linaclotide 145 MCG Caps capsule Commonly known as:  LINZESS Take 1 capsule (145 mcg total) by mouth daily before breakfast.    lisinopril 5 MG tablet Commonly known as:  PRINIVIL,ZESTRIL TAKE 1 TABLET (5 MG TOTAL) BY MOUTH DAILY.   metoCLOPramide 10 MG tablet Commonly known as:  REGLAN TAKE 1 TABLET (10 MG TOTAL) BY MOUTH 4 (FOUR) TIMES DAILY - BEFORE MEALS AND AT BEDTIME.   metoprolol succinate 25 MG 24 hr tablet Commonly known as:  TOPROL-XL Take 12.5 mg by mouth daily.   metroNIDAZOLE 500 MG tablet Commonly known as:  FLAGYL Take 1 tablet (500 mg total) by mouth 3 (three) times daily for 9 days.   mycophenolate 500 MG tablet Commonly known as:  CELLCEPT Take 1,000 mg by mouth 2 (two) times daily.   nystatin cream Commonly known as:  MYCOSTATIN Apply 1 application topically 2 (two) times daily.   ondansetron 4 MG disintegrating tablet Commonly known as:  ZOFRAN ODT Take 1 tablet (4 mg total) by mouth every 8 (eight) hours as needed for nausea or vomiting. What changed:  when to take this   oxyCODONE-acetaminophen 5-325 MG tablet Commonly known as:  PERCOCET/ROXICET Take 1 tablet by mouth every 8 (eight) hours as needed for severe pain.   OXYGEN Inhale 2 mLs into the lungs at bedtime.   pantoprazole 40 MG tablet Commonly known as:  PROTONIX Take 40 mg by mouth 2 (two) times daily.   predniSONE 5 MG tablet Commonly known as:  DELTASONE Take 5 mg by mouth daily.   Probiotic 250 MG Caps Take 1 tablet by mouth daily.   promethazine 12.5 MG tablet Commonly known as:  PHENERGAN Take 1 tablet (12.5 mg total) by mouth every 8 (eight) hours as needed for nausea or vomiting.   REFRESH OP Place 2 drops into both eyes daily as needed (dry eyes).   sterile water for irrigation 237 mL Give 2.5 mL/hr by tube continuous.   sucralfate 1 GM/10ML suspension Commonly known as:  CARAFATE Take 1 g by mouth 4 (four) times daily. Before meals and nightly   SYRINGE 3CC/25GX1" 25G X 1" 3 ML Misc Use for b12 injections   vitamin C 1000 MG tablet Take 1,000 mg by mouth daily.         Today    CHIEF COMPLAINT:   Patient ready for discharge today.  Doing well   VITAL SIGNS:  Blood pressure 125/66, pulse 79, temperature 98.1 F (36.7 C), temperature source Oral, resp. rate 18, height 5\' 1"  (1.549 m), weight 60.5 kg (133 lb 6.1 oz), SpO2 95 %.   REVIEW OF SYSTEMS:  Review of Systems  Constitutional: Negative.  Negative for chills, fever and malaise/fatigue.  HENT: Negative.  Negative for ear discharge, ear pain, hearing loss, nosebleeds and sore throat.   Eyes: Negative.  Negative for blurred vision and pain.  Respiratory: Negative.  Negative for cough, hemoptysis, shortness of breath and wheezing.   Cardiovascular: Negative.  Negative for chest pain, palpitations and leg swelling.  Gastrointestinal: Negative.  Negative for abdominal pain, blood in stool, diarrhea, nausea and vomiting.  Genitourinary: Negative.  Negative for dysuria.  Musculoskeletal: Negative.  Negative for back pain.  Skin: Negative.   Neurological: Negative for dizziness, tremors, speech change, focal weakness, seizures and headaches.  Endo/Heme/Allergies: Negative.  Does not bruise/bleed easily.  Psychiatric/Behavioral: Negative.  Negative for depression, hallucinations and suicidal ideas.     PHYSICAL EXAMINATION:  GENERAL:  83 y.o.-year-old patient lying in the bed with no acute distress.  NECK:  Supple, no jugular venous distention. No thyroid enlargement, no tenderness.  LUNGS: Normal breath sounds bilaterally, no wheezing, rales,rhonchi  No use of accessory muscles of respiration.  CARDIOVASCULAR: S1, S2 normal. No murmurs, rubs, or gallops.  ABDOMEN: Soft, non-tender, non-distended. Bowel sounds present. No organomegaly or mass.  EXTREMITIES: No pedal edema, cyanosis, or clubbing.  PSYCHIATRIC: The patient is alert and oriented x 3.  SKIN: No obvious rash, lesion, or ulcer.   DATA REVIEW:   CBC Recent Labs  Lab 11/30/17 0406  WBC 4.4  HGB 8.8*  HCT 27.9*  PLT 309    Chemistries   Recent Labs  Lab 11/28/17 2152  12/02/17 0548  NA 147*   < > 148*  K 4.9   < > 3.3*  CL 116*   < > 123*  CO2 20*   < > 20*  GLUCOSE 159*   < > 106*  BUN 30*   < >  10  CREATININE 1.81*   < > 0.97  CALCIUM 9.4   < > 8.1*  AST 27  --   --   ALT 10*  --   --   ALKPHOS 75  --   --   BILITOT 0.6  --   --    < > = values in this interval not displayed.    Cardiac Enzymes Recent Labs  Lab 11/29/17 0423 11/29/17 1007 11/29/17 1554  TROPONINI 0.05* 0.04* 0.04*    Microbiology Results  @MICRORSLT48 @  RADIOLOGY:  Ct Abdomen Pelvis Wo Contrast  Result Date: 12/01/2017 CLINICAL DATA:  Acute sigmoid diverticulitis, evaluate for bowel perforation EXAM: CT ABDOMEN AND PELVIS WITHOUT CONTRAST TECHNIQUE: Multidetector CT imaging of the abdomen and pelvis was performed following the standard protocol without IV contrast. COMPARISON:  11/28/2017. FINDINGS: Lower chest: Trace bilateral pleural effusions. Mild bilateral lower lobe atelectasis. Hepatobiliary: Unenhanced liver is grossly unremarkable. Gallbladder is unremarkable. No intrahepatic or extrahepatic ductal dilatation. Pancreas: Parenchymal atrophy.  No ductal dilatation or mass. Spleen: Surgically absent. Adrenals/Urinary Tract: Adrenal glands are within normal limits. 3.3 cm lateral left upper pole renal cyst (series 2/image 22). 2 mm nonobstructing right lower pole renal calculus (series 2/image 45). 12 mm right lower pole renal cyst (series 2/image 48). No hydronephrosis. Bladder is within normal limits. Stomach/Bowel: Stomach is notable for a large hiatal hernia/inverted intrathoracic stomach. No evidence of bowel obstruction. Appendix is not discretely visualized. Acute sigmoid diverticulitis. Mild pericolonic inflammatory changes (for example, series 2/image 54). No drainable fluid collection/abscess. No free air to suggest macroscopic perforation. Vascular/Lymphatic: No evidence of abdominal aortic aneurysm. Atherosclerotic  calcifications of the abdominal aorta and branch vessels. No suspicious abdominopelvic lymphadenopathy. Reproductive: Uterus is within normal limits. No adnexal masses. Other: No abdominopelvic ascites. Musculoskeletal: Lumbar dextroscoliosis with degenerative changes. Prior vertebral augmentation at L1 and L2. IMPRESSION: Acute sigmoid diverticulitis. No drainable fluid collection/abscess. No free air to suggest macroscopic perforation. Additional stable ancillary findings as above. Electronically Signed   By: Charline Bills M.D.   On: 12/01/2017 11:40      Allergies as of 12/02/2017      Reactions   Tramadol Other (See Comments), Rash   Rash   Amoxicillin Other (See Comments)   Reaction:  Stomach cramps    Cefuroxime Itching   Contrast Media [iodinated Diagnostic Agents] Hives   Metrizamide Hives   Morphine And Related    Reaction: face flushed    Propoxyphene    Stomach cramp   Cephalexin Rash   Sulfa Antibiotics Rash   REACTION: Rash   Sulfonamide Derivatives Rash   Tramadol Hcl Rash   Venlafaxine Rash      Medication List    STOP taking these medications   warfarin 3 MG tablet Commonly known as:  COUMADIN   warfarin 4 MG tablet Commonly known as:  COUMADIN     TAKE these medications   ADCIRCA 20 MG tablet Generic drug:  tadalafil (PAH) Take 40 mg by mouth daily.   Calcium Carbonate-Vitamin D 600-400 MG-UNIT tablet Take 1 tablet by mouth daily.   ciprofloxacin 500 MG tablet Commonly known as:  CIPRO Take 1 tablet (500 mg total) by mouth 2 (two) times daily for 9 days.   cyanocobalamin 1000 MCG/ML injection Commonly known as:  (VITAMIN B-12) Inject 1 mL (1,000 mcg total) into the muscle every 30 (thirty) days. Pt uses on the 1st of every month.   hydroxychloroquine 200 MG tablet Commonly known as:  PLAQUENIL Take 200  mg by mouth daily.   Lactulose 20 GM/30ML Soln 30 ml every 4 hours until constipation is relieved   letrozole 2.5 MG tablet Commonly  known as:  FEMARA Take 1 tablet (2.5 mg total) by mouth daily.   levothyroxine 150 MCG tablet Commonly known as:  SYNTHROID, LEVOTHROID Take 1 tablet (150 mcg total) by mouth daily.   linaclotide 145 MCG Caps capsule Commonly known as:  LINZESS Take 1 capsule (145 mcg total) by mouth daily before breakfast.   lisinopril 5 MG tablet Commonly known as:  PRINIVIL,ZESTRIL TAKE 1 TABLET (5 MG TOTAL) BY MOUTH DAILY.   metoCLOPramide 10 MG tablet Commonly known as:  REGLAN TAKE 1 TABLET (10 MG TOTAL) BY MOUTH 4 (FOUR) TIMES DAILY - BEFORE MEALS AND AT BEDTIME.   metoprolol succinate 25 MG 24 hr tablet Commonly known as:  TOPROL-XL Take 12.5 mg by mouth daily.   metroNIDAZOLE 500 MG tablet Commonly known as:  FLAGYL Take 1 tablet (500 mg total) by mouth 3 (three) times daily for 9 days.   mycophenolate 500 MG tablet Commonly known as:  CELLCEPT Take 1,000 mg by mouth 2 (two) times daily.   nystatin cream Commonly known as:  MYCOSTATIN Apply 1 application topically 2 (two) times daily.   ondansetron 4 MG disintegrating tablet Commonly known as:  ZOFRAN ODT Take 1 tablet (4 mg total) by mouth every 8 (eight) hours as needed for nausea or vomiting. What changed:  when to take this   oxyCODONE-acetaminophen 5-325 MG tablet Commonly known as:  PERCOCET/ROXICET Take 1 tablet by mouth every 8 (eight) hours as needed for severe pain.   OXYGEN Inhale 2 mLs into the lungs at bedtime.   pantoprazole 40 MG tablet Commonly known as:  PROTONIX Take 40 mg by mouth 2 (two) times daily.   predniSONE 5 MG tablet Commonly known as:  DELTASONE Take 5 mg by mouth daily.   Probiotic 250 MG Caps Take 1 tablet by mouth daily.   promethazine 12.5 MG tablet Commonly known as:  PHENERGAN Take 1 tablet (12.5 mg total) by mouth every 8 (eight) hours as needed for nausea or vomiting.   REFRESH OP Place 2 drops into both eyes daily as needed (dry eyes).   sterile water for irrigation 237  mL Give 2.5 mL/hr by tube continuous.   sucralfate 1 GM/10ML suspension Commonly known as:  CARAFATE Take 1 g by mouth 4 (four) times daily. Before meals and nightly   SYRINGE 3CC/25GX1" 25G X 1" 3 ML Misc Use for b12 injections   vitamin C 1000 MG tablet Take 1,000 mg by mouth daily.          Management plans discussed with the patient and she is in agreement. Stable for discharge [facility]  Patient should follow up with  PCP  CODE STATUS:     Code Status Orders  (From admission, onward)        Start     Ordered   11/29/17 0356  Full code  Continuous     11/29/17 0355    Code Status History    Date Active Date Inactive Code Status Order ID Comments User Context   07/17/2015 18:42 07/20/2015 20:04 Full Code [id number]  Shaune Pollack, MD Inpatient   06/09/2015 01:41 06/13/2015 17:06 Full Code [id number]  Oralia Manis, MD Inpatient   03/28/2015 10:41 04/03/2015 17:11 Full Code [id number]  Earline Mayotte, MD Inpatient   03/26/2015 14:00 03/28/2015 10:41 Full Code [id number]  Donnalee Curry  Lacretia Nicks, MD Inpatient   03/11/2015 11:04 03/20/2015 19:19 Full Code [id number]  Earline Mayotte, MD Inpatient    Advance Directive Documentation     Most Recent Value  Type of Advance Directive  Living will, Healthcare Power of Attorney  Pre-existing out of facility DNR order (yellow form or pink MOST form)  No data  "MOST" Form in Place?  No data      TOTAL TIME TAKING CARE OF THIS PATIENT: 38 minutes.    Note: This dictation was prepared with Dragon dictation along with smaller phrase technology. Any transcriptional errors that result from this process are unintentional.  Tehilla Coffel M.D on 12/02/2017 at 9:50 AM  Between 7am to 6pm - Pager - [phone] After 6pm go to [email or web address] - Social research officer, government  [facility]  Office  [phone]  CC: Primary care physician; Sherlene Shams, MD

## 2017-12-02 NOTE — Care Management (Signed)
Informed patient to discharge home today and is now agreeable to home health.  No agency preference.  Patient will require RN and PT.  RN will need to draw INR.  Discussed with attending needed this in the home health order.  Confirmed contact information. Referal called to and accepted by [facility]

## 2017-12-02 NOTE — Progress Notes (Signed)
Patient refusing bed alarm and assistance at this time.

## 2017-12-02 NOTE — Progress Notes (Signed)
SURGICAL PROGRESS NOTE (cpt [phone])  Hospital Day(s): 3.   Post op day(s):  Marland Kitchen   Interval History: Patient seen and examined, no acute events or new complaints overnight. Patient underwent repeat CT yesterday and reports nearly complete resolution of her LLQ abdominal pain and denies further nausea or diarrhea this morning. She otherwise reports she's continued to tolerate her diet and denies fever/chills, CP, or SOB.  Review of Systems:  Constitutional: denies fever, chills  HEENT: denies cough or congestion  Respiratory: denies any shortness of breath  Cardiovascular: denies chest pain or palpitations  Gastrointestinal: abdominal pain, N/V, and bowel function as per interval history Genitourinary: denies burning with urination or urinary frequency Musculoskeletal: denies pain, decreased motor or sensation Integumentary: denies any other rashes or skin discolorations Neurological: denies HA or vision/hearing changes   Vital signs in last 24 hours: [min-max] current  Temp:  [98.1 F (36.7 C)-98.2 F (36.8 C)] 98.1 F (36.7 C) (03/16 0433) Pulse Rate:  [79-91] 79 (03/16 0433) Resp:  [18-20] 18 (03/16 0433) BP: (124-145)/(63-75) 125/66 (03/16 0433) SpO2:  [94 %-95 %] 95 % (03/16 0433) Weight:  [133 lb 6.1 oz (60.5 kg)] 133 lb 6.1 oz (60.5 kg) (03/16 0433)     Height: 5\' 1"  (154.9 cm) Weight: 133 lb 6.1 oz (60.5 kg) BMI (Calculated): 25.21   Intake/Output this shift:  No intake/output data recorded.   Intake/Output last 2 shifts:  @IOLAST2SHIFTS @   Physical Exam:  Constitutional: alert, cooperative and no distress  HENT: normocephalic without obvious abnormality  Eyes: PERRL, EOM's grossly intact and symmetric  Neuro: CN II - XII grossly intact and symmetric without deficit  Respiratory: breathing non-labored at rest  Cardiovascular: regular rate and sinus rhythm  Gastrointestinal: soft and non-distended with minimal LLQ abdominal tenderness to even deep  palpation Musculoskeletal: UE and LE FROM, no edema or wounds, motor and sensation grossly intact, NT   Labs:  CBC Latest Ref Rng & Units 11/30/2017 11/28/2017 05/26/2017  WBC 3.6 - 11.0 K/uL 4.4 6.4 8.1  Hemoglobin 12.0 - 16.0 g/dL 8.8(L) 11.7(L) 10.7(L)  Hematocrit 35.0 - 47.0 % 27.9(L) 35.5 32.4(L)  Platelets 150 - 440 K/uL 309 349 333   CMP Latest Ref Rng & Units 12/02/2017 11/30/2017 11/28/2017  Glucose 65 - 99 mg/dL 106(H) 92 159(H)  BUN 6 - 20 mg/dL 10 24(H) 30(H)  Creatinine 0.44 - 1.00 mg/dL 0.97 1.25(H) 1.81(H)  Sodium 135 - 145 mmol/L 148(H) 148(H) 147(H)  Potassium 3.5 - 5.1 mmol/L 3.3(L) 3.8 4.9  Chloride 101 - 111 mmol/L 123(H) 123(H) 116(H)  CO2 22 - 32 mmol/L 20(L) 20(L) 20(L)  Calcium 8.9 - 10.3 mg/dL 8.1(L) 7.9(L) 9.4  Total Protein 6.5 - 8.1 g/dL - - 7.5  Total Bilirubin 0.3 - 1.2 mg/dL - - 0.6  Alkaline Phos 38 - 126 U/L - - 75  AST 15 - 41 U/L - - 27  ALT 14 - 54 U/L - - 10(L)   Imaging studies:  CT Abdomen and Pelvis without Contrast (12/01/2017) - personally reviewed, compared to prior study, and discussed with patient Mild pericolonic inflammatory changes (for example, series 2/image 54). No drainable fluid collection/abscess. No free air to suggest macroscopic perforation. No evidence of bowel obstruction. Large hiatal hernia.  Assessment/Plan: (ICD-10's:K57.32) 83 y.o.femalewith recurrent acute sigmoid colonic diverticulitis without abscess, complicated by pertinent comorbidities includingHTN, HLD, CHF, chronic Coumadin anticoagulation for atrial fibrillation, pulmonary HTN, home supplemental oxygen qhs, CKD, hypothyroidism, chronic low-dose steroids for lupus, chronic constipation attributed to chronic narcotics  for chronic lower back pain attributed to scoliosis with sciatica, GERD with large hiatal hernia (history of gastric volvulus), osteopenia, peripheral neuropathy, history of DVT and history of breast cancer.  - IV --> PO  antibiotics -soft (low-residue) diet x 6 weeks - pain control prn (minimize narcotics as able) - continue to monitor abdominal exam and bowel function - medical management of comorbidities as per primary medical team - requires an effective bowel regimen to minimize constipation - discharge planning as per medical team  - outpatient surgical follow-up in 1 week - ambulation encouraged  All of the above findings and recommendations were discussed with the patient and her family, and all of patient's and her family's questions were answered to her expressed satisfaction.  Thank you for the opportunity to participate in this patient's care.  -- Marilynne Drivers Rosana Hoes, MD, [place]: [facility] - Partnering for exceptional care. Office: [phone]

## 2017-12-02 NOTE — Progress Notes (Signed)
Discharge instructions reviewed with patient and son.  Understanding was verbalized and all questions were answered.  Patient discharged home via wheelchair in stable condition escorted by volunteer staff.

## 2017-12-04 ENCOUNTER — Telehealth: Payer: Self-pay | Admitting: Internal Medicine

## 2017-12-04 DIAGNOSIS — N390 Urinary tract infection, site not specified: Secondary | ICD-10-CM | POA: Diagnosis not present

## 2017-12-04 DIAGNOSIS — K5732 Diverticulitis of large intestine without perforation or abscess without bleeding: Secondary | ICD-10-CM | POA: Diagnosis not present

## 2017-12-04 NOTE — Telephone Encounter (Signed)
Copied from [place] [phone]. Topic: Quick Communication - See Telephone Encounter >> Dec 04, 2017  3:26 PM Cleaster Corin, NT wrote: CRM for notification. See Telephone encounter for:   12/04/17 Diana [name] calling from  [place] [facility] requesting orders for nursing for pt. 1 week 5. Diana [name] would like to also speak with nurse to see what pt. Last A1C level was when did pt. Have her flu and pneumonia shot. Contact number [phone]

## 2017-12-05 ENCOUNTER — Telehealth: Payer: Self-pay | Admitting: Internal Medicine

## 2017-12-05 DIAGNOSIS — Z7901 Long term (current) use of anticoagulants: Secondary | ICD-10-CM

## 2017-12-05 NOTE — Telephone Encounter (Signed)
Home Health RN, [facility], calling to report they cant check the patient's INR daily because of insurance. Please call her back to discuss.

## 2017-12-05 NOTE — Telephone Encounter (Signed)
Transition Care Management Follow-up Telephone Call  How have you been since you were released from the hospital? Patient stated feeling some better, but still very weak.   Do you understand why you were in the hospital? yes   Do you understand the discharge instrcutions? yes  Items Reviewed:  Medications reviewed: yes  Allergies reviewed: yes  Dietary changes reviewed: yes  Referrals reviewed: yes   Functional Questionnaire:   Activities of Daily Living (ADLs):   She states they are independent in the following: ambulation, bathing and hygiene, feeding, continence, grooming, toileting and dressing States they require assistance with the following: has homehealth.   Any transportation issues/concerns?: no   Any patient concerns? no   Confirmed importance and date/time of follow-up visits scheduled: yes   Confirmed with patient if condition begins to worsen call PCP or go to the ER.  Patient was given the Call-a-Nurse line [phone]: yes

## 2017-12-05 NOTE — Telephone Encounter (Signed)
Patient cipro  500 mg 2 x times per day for nine days, gave order to check INR daily and advised coumadin held for now until INR received or while patient on ABX for next 6 days for a total of nine days.

## 2017-12-05 NOTE — Telephone Encounter (Signed)
Copied from [place]. Topic: Quick Communication - See Telephone Encounter >> Dec 05, 2017  2:15 PM Bea Graff, NT wrote: CRM for notification. See Telephone encounter for: Pt is needing to speak to a nurse to see how much coumadin she should take tonight. Alvis Lemmings was to draw her A1C yesterday and she has not heard the results.   12/05/17.

## 2017-12-05 NOTE — Telephone Encounter (Signed)
Spoke with Alma Downs from [facility] gave her the verbal orders for nursing 1x for 5 weeks. Also informed her of the last A1C, flu shot and pneumonia vaccines. Alma Downs also reported with the pt's INR result from yesterday and it was 2.0. Pt is not currently on coumadin due to being put on hold while in the hospital.

## 2017-12-05 NOTE — Telephone Encounter (Signed)
Do not resume her regular regimen of  coumadin yet.  Patient taking antibiotics per dc summary.  Home Health needs order to check iNR daily and let me know how long the abx are prescribed for

## 2017-12-05 NOTE — Telephone Encounter (Signed)
Please advise. Thank you

## 2017-12-06 NOTE — Telephone Encounter (Signed)
Spoke with pt and she is coming in tomorrow after her appt at the hospital and then she will have the one on Monday done by the home health nurse. Pt is aware of appt date and time. Lab has been ordered.

## 2017-12-06 NOTE — Telephone Encounter (Signed)
Would it be ok to call and ask the patient if she could come in to our office and have it drawn every day?

## 2017-12-06 NOTE — Telephone Encounter (Signed)
Too much trouble for her  Have her come every other day starting tomorrow.

## 2017-12-07 ENCOUNTER — Other Ambulatory Visit: Payer: Self-pay | Admitting: *Deleted

## 2017-12-07 ENCOUNTER — Encounter: Payer: Self-pay | Admitting: Surgery

## 2017-12-07 ENCOUNTER — Other Ambulatory Visit: Payer: Self-pay | Admitting: Internal Medicine

## 2017-12-07 ENCOUNTER — Other Ambulatory Visit (INDEPENDENT_AMBULATORY_CARE_PROVIDER_SITE_OTHER): Payer: Medicare Other

## 2017-12-07 ENCOUNTER — Ambulatory Visit (INDEPENDENT_AMBULATORY_CARE_PROVIDER_SITE_OTHER): Payer: Medicare Other | Admitting: Surgery

## 2017-12-07 VITALS — BP 127/69 | HR 111 | Temp 97.9°F | Ht 63.0 in | Wt 135.0 lb

## 2017-12-07 DIAGNOSIS — E876 Hypokalemia: Secondary | ICD-10-CM | POA: Diagnosis not present

## 2017-12-07 DIAGNOSIS — K5732 Diverticulitis of large intestine without perforation or abscess without bleeding: Secondary | ICD-10-CM

## 2017-12-07 DIAGNOSIS — Z7901 Long term (current) use of anticoagulants: Secondary | ICD-10-CM

## 2017-12-07 MED ORDER — POTASSIUM CHLORIDE 20 MEQ/15ML (10%) PO SOLN: 20.0000 meq | Freq: Every day | ORAL | 3 refills | Status: DC

## 2017-12-07 MED ORDER — FUROSEMIDE 20 MG PO TABS: ORAL_TABLET | ORAL | 3 refills | Status: DC

## 2017-12-07 NOTE — Addendum Note (Signed)
Addended by: Arby Barrette on: 12/07/2017 04:04 PM   Modules accepted: Orders

## 2017-12-07 NOTE — Patient Instructions (Signed)
Please continue to wear your compression stockings, elevate your leg when sitting down. Please go and see your primary doctor so he/she could prescribe you something for your leg swelling.  Please let your primary care doctor know that you were at the hospital for diverticulitis.  Please call Dr. Percell Boston office and let him know that you continue to have nausea and having problems with your bowel movements.  Please give [place] a call in case you have any questions or concerns.

## 2017-12-07 NOTE — Progress Notes (Signed)
Surgical Clinic Progress/Follow-up Note   HPI:  83 y.o. Female presents to clinic for follow-up evaluation of sigmoid colonic diverticulitis. Patient reports her LLQ abdominal pain has completely resolved, though her nausea and mild crampy variable lower abdominal pain with a metallic taste in her mouth and loose BM's, which she attributes to her antibiotics. Accordingly, patient says she looks forward to completing her prescribed antibiotics this coming Sunday. Longer term, patient describes she's had difficulty with managing/balancing between constipation and diarrhea since she started taking Linzess. She denies any blood per rectum, fever/chills, CP, or SOB.  Review of Systems:  Constitutional: denies any other weight loss, fever, chills, or sweats  Eyes: denies any other vision changes, history of eye injury  ENT: denies sore throat, hearing problems  Respiratory: denies shortness of breath, wheezing  Cardiovascular: denies chest pain, palpitations  Gastrointestinal: abdominal pain, N/V, and bowel function as per HPI Musculoskeletal: denies any other joint pains or cramps  Skin: Denies any other rashes or skin discolorations  Neurological: denies any other headache, dizziness, weakness  Psychiatric: denies any other depression, anxiety  All other review of systems: otherwise negative   Vital Signs:  BP 127/69   Pulse (!) 111   Temp 97.9 F (36.6 C) (Oral)   Ht 5\' 3"  (1.6 m)   Wt 135 lb (61.2 kg)   BMI 23.91 kg/m    Physical Exam:  Constitutional:  -- Normal body habitus  -- Awake, alert, and oriented x3  Eyes:  -- Pupils equally round and reactive to light  -- No scleral icterus  Ear, nose, throat:  -- No jugular venous distension  -- No nasal drainage, bleeding Pulmonary:  -- No crackles -- Equal breath sounds bilaterally -- Breathing non-labored at rest Cardiovascular:  -- S1, S2 present  -- No pericardial rubs  Gastrointestinal:  -- Soft, nontender,  non-distended, no guarding/rebound  -- No abdominal masses appreciated, pulsatile or otherwise  Musculoskeletal / Integumentary:  -- Wounds or skin discoloration: None appreciated  -- Extremities: B/L UE and LE FROM, hands and feet warm, no edema  Neurologic:  -- Motor function: intact and symmetric  -- Sensation: intact and symmetric   Assessment:  83 y.o. yo Female with a problem list including...  Patient Active Problem List   Diagnosis Date Noted  . Acute diverticulitis 11/29/2017  . Ventral hernia without obstruction or gangrene 11/09/2017  . Candidiasis of skin 11/09/2017  . Anticoagulation goal of INR 2 to 3 09/14/2016  . Acquired asplenia 08/21/2016  . Sjogrens syndrome ([place]) 08/21/2016  . Long term systemic steroid user 08/17/2016  . Facet arthritis of lumbosacral region ([place]) 12/16/2015  . Concussion 08/17/2015  . H. pylori infection 08/17/2015  . H/O adenomatous polyp of colon 08/17/2015  . Hypertension 08/17/2015  . Hyperlipidemia, unspecified 08/17/2015  . Absolute anemia 07/17/2015  . Non-traumatic compression fracture of L2 lumbar vertebra ([place]) 07/03/2015  . Generalized weakness 07/03/2015  . Rectal bleeding 06/08/2015  . Esophagitis, CMV ([place]) 05/26/2015  . Truncal muscle weakness 04/08/2015  . History of fall within past 90 days 04/07/2015  . Protein-calorie malnutrition ([place]) 04/06/2015  . Erosive esophagitis 03/28/2015  . Gastric volvulus 03/24/2015  . Nausea with vomiting 03/24/2015  . Constipation 12/21/2014  . Dyspnea 11/13/2014  . Shingles 11/10/2014  . Venous (peripheral) insufficiency 06/13/2014  . Breast cancer of upper-outer quadrant of left female breast ([place]) 05/26/2014  . Malignant neoplasm of upper-outer quadrant of female breast ([place]) 05/26/2014  . Squamous cell carcinoma of skin of  upper limb, including shoulder 02/18/2014  . Urinary incontinence 01/15/2014  . Other specified disorders of bladder 01/02/2014  . Other and unspecified  hyperlipidemia 08/21/2013  . Familial multiple lipoprotein-type hyperlipidemia 08/21/2013  . History of recurrent pneumonia 05/26/2013  . Pulmonary hypertensive venous disease ([place]) 04/23/2013  . Diastolic dysfunction with heart failure ([place]) 04/23/2013  . Diastolic heart failure ([place]) 04/23/2013  . Long term current use of anticoagulant therapy 03/27/2013  . Incomplete bladder emptying 03/06/2013  . Acute thromboembolism of deep veins of lower extremity ([place]) 01/07/2013  . Chronic venous embolism and thrombosis of deep vessels of lower extremity ([place]) 01/07/2013  . Hernia, hiatal 01/06/2013  . Renal mass, left 01/06/2013  . Diaphragmatic hernia 01/06/2013  . Disorder of kidney and ureter 01/06/2013  . Left leg DVT ([place]) 12/18/2012  . Sciatica 11/20/2012  . Other postherpetic nervous system involvement 11/20/2012  . Herpes zoster with nervous system complications 03/54/6568  . Microscopic hematuria 11/14/2012  . Disseminated zoster 10/31/2012  . Herpes zoster complications 12/75/1700  . Insomnia 11/08/2011  . Lupus erythematosus 06/13/2008  . NEUROPATHY-PERIPHERAL 10/06/2006  . Hereditary and idiopathic peripheral neuropathy 10/06/2006  . Hypothyroidism 09/25/2006  . ANEMIA, B12 DEFICIENCY 09/25/2006  . GERD 09/25/2006  . Lung involvement in systemic lupus erythematosus ([place]) 09/25/2006  . Type 2 diabetes mellitus without complications ([place]) 17/49/4496  . SHINGLES, HX OF 09/25/2006  . TOBACCO ABUSE, HX OF 09/25/2006  . Personal history of endocrine, metabolic or immunity disorder 09/25/2006  . History of disease 09/25/2006    presents to clinic for follow-up evaluation of resolving sigmoid colonic diverticulitis.  Plan:   - complete prescribed antibiotics  - follow-up with PMD and Dr. Rhea Bleacher for IBD  - no colonoscopy or surgical intervention indicated at this time  - return to clinic as needed, instructed to call office if any questions or concerns  All of the above  recommendations were discussed with the patient, and all of patient's questions were answered to her expressed satisfaction.  -- Marilynne Drivers Rosana Hoes, MD, [place]: [facility] - Partnering for exceptional care. Office: [phone]

## 2017-12-09 ENCOUNTER — Encounter: Payer: Self-pay | Admitting: Surgery

## 2017-12-10 DIAGNOSIS — K5732 Diverticulitis of large intestine without perforation or abscess without bleeding: Secondary | ICD-10-CM

## 2017-12-11 ENCOUNTER — Telehealth: Payer: Self-pay | Admitting: Internal Medicine

## 2017-12-11 DIAGNOSIS — N390 Urinary tract infection, site not specified: Secondary | ICD-10-CM | POA: Diagnosis not present

## 2017-12-11 DIAGNOSIS — K5732 Diverticulitis of large intestine without perforation or abscess without bleeding: Secondary | ICD-10-CM | POA: Diagnosis not present

## 2017-12-11 NOTE — Telephone Encounter (Signed)
What is her current regi,men?   I cannot  find recent message in chart

## 2017-12-11 NOTE — Telephone Encounter (Signed)
Patient still on antibiotic therapy.

## 2017-12-11 NOTE — Telephone Encounter (Signed)
Alternating 4 mg one night and 3 mg the next night.

## 2017-12-11 NOTE — Telephone Encounter (Signed)
Copied from [place] [id number]. Topic: Quick Communication - See Telephone Encounter >> Dec 11, 2017 12:35 PM Marin Olp L wrote: CRM for notification. See Telephone encounter for: 12/11/17. Inez Catalina, w/ [facility] reporting that today patient's INR is 1.1 CB#[id number]

## 2017-12-11 NOTE — Telephone Encounter (Signed)
Ok, have her resume previous regimen tonight

## 2017-12-11 NOTE — Telephone Encounter (Signed)
You advised no coumadin while on antibiotic last  antibiotics today.

## 2017-12-12 ENCOUNTER — Other Ambulatory Visit: Payer: Self-pay | Admitting: Oncology

## 2017-12-12 DIAGNOSIS — C50412 Malignant neoplasm of upper-outer quadrant of left female breast: Secondary | ICD-10-CM

## 2017-12-12 LAB — SPECIMEN STATUS REPORT

## 2017-12-12 LAB — PROTIME-INR
INR: 1.2 (ref 0.8–1.2)
PROTHROMBIN TIME: 12.3 s — AB (ref 9.1–12.0)

## 2017-12-12 NOTE — Telephone Encounter (Signed)
[facility] notified of new dosing of coumadin alternating 4 mg and 3 mg.

## 2017-12-12 NOTE — Telephone Encounter (Signed)
I will refill it for 1 month and further refills after she reschedules and shows up for next appointment

## 2017-12-12 NOTE — Telephone Encounter (Signed)
Patient cancelled her appointment 3/14 duew to being hospitalized and has not rescheduled her appointment. Do you wish to refill her Letrozole?

## 2017-12-13 ENCOUNTER — Encounter: Payer: Self-pay | Admitting: Internal Medicine

## 2017-12-13 ENCOUNTER — Ambulatory Visit (INDEPENDENT_AMBULATORY_CARE_PROVIDER_SITE_OTHER): Payer: Medicare Other | Admitting: Internal Medicine

## 2017-12-13 DIAGNOSIS — Z09 Encounter for follow-up examination after completed treatment for conditions other than malignant neoplasm: Secondary | ICD-10-CM | POA: Diagnosis not present

## 2017-12-13 DIAGNOSIS — N189 Chronic kidney disease, unspecified: Secondary | ICD-10-CM

## 2017-12-13 DIAGNOSIS — I82509 Chronic embolism and thrombosis of unspecified deep veins of unspecified lower extremity: Secondary | ICD-10-CM

## 2017-12-13 DIAGNOSIS — K5792 Diverticulitis of intestine, part unspecified, without perforation or abscess without bleeding: Secondary | ICD-10-CM | POA: Diagnosis not present

## 2017-12-13 DIAGNOSIS — E440 Moderate protein-calorie malnutrition: Secondary | ICD-10-CM

## 2017-12-13 DIAGNOSIS — N183 Chronic kidney disease, stage 3 unspecified: Secondary | ICD-10-CM

## 2017-12-13 DIAGNOSIS — D631 Anemia in chronic kidney disease: Secondary | ICD-10-CM

## 2017-12-13 MED ORDER — METOCLOPRAMIDE HCL 10 MG PO TABS: 10.0000 mg | ORAL_TABLET | Freq: Three times a day (TID) | ORAL | 1 refills | Status: DC

## 2017-12-13 NOTE — Progress Notes (Signed)
Subjective:  Patient ID: Diana [name], female    DOB: January 09, 1935  Age: 83 y.o. MRN: [id number]  CC: Diagnoses of Hospital discharge follow-up, Acute diverticulitis, Moderate protein-calorie malnutrition (HCC), Anemia due to stage 3 chronic kidney disease (HCC), and Chronic venous embolism and thrombosis of deep vessels of lower extremity, unspecified laterality (HCC) were pertinent to this visit.  HPI Diana [name] presents for hospital folllow up. Patient was admitted to [facility]  On March 12 with dehydration  And atrial  fib with RVR secondary to N/V/D attributed to acute  diverticulitis and UTI .  She was evaluated by Surgery,  treated without surgical intervention with bowel rest and empiric antibiotics and IV fluids  and improved, although her abdominal pain resolved,  Her diarrhea did not resolve . She was discharged home on March 16 discharged on cipro and flagyl .  C dif test was done and negative .   Atrial fib with rvr:  resolved with hydration. She has a hstory of PAF;    History of DVT:  Coumadin was held at d/c due to ongoing antibiotic therapy and suprathepeutic INR at admission .  Home regimen was started yesterday  March 25 3 mg  alt with 4 INR was 1.2 on 3/21  Today she does not feel well.  Has been having diarrhea since  Before hospital discharge,  improved from constant to now 3 loose stools daily .  Has resumed linzess.  Finished the abx for diverticulitis on Monday    Still nauseated  , never resolved.  Suprapubic pain   Edema:  wearing stockings.  Secondary to receiving iv fluids , with a  weight gain of 8 lbs .  Checking weights at home started at 131 and started lasix  20 mg daily has dropped 6 lbs .  Wearing compression stockings  now   Lab Results  Component Value Date   INR 1.2 12/07/2017   INR 3.11 12/02/2017   INR 2.94 12/01/2017   Lab Results  Component Value Date   HGBA1C 6.2 10/18/2017     Outpatient Medications Prior to Visit  Medication Sig  Dispense Refill  . Ascorbic Acid (VITAMIN C) 1000 MG tablet Take 1,000 mg by mouth daily.    . Calcium Carbonate-Vitamin D 600-400 MG-UNIT tablet Take 1 tablet by mouth daily.    . cyanocobalamin (,VITAMIN B-12,) 1000 MCG/ML injection Inject 1 mL (1,000 mcg total) into the muscle every 30 (thirty) days. Pt uses on the 1st of every month. 10 mL 1  . furosemide (LASIX) 20 MG tablet 1 tablet daily as needed for fluid retention 30 tablet 3  . hydroxychloroquine (PLAQUENIL) 200 MG tablet Take 200 mg by mouth daily.    . Lactulose 20 GM/30ML SOLN 30 ml every 4 hours until constipation is relieved 236 mL 3  . letrozole (FEMARA) 2.5 MG tablet TAKE 1 TABLET BY MOUTH EVERY DAY 30 tablet 0  . levothyroxine (SYNTHROID, LEVOTHROID) 150 MCG tablet Take 1 tablet (150 mcg total) by mouth daily. 90 tablet 3  . linaclotide (LINZESS) 145 MCG CAPS capsule Take 1 capsule (145 mcg total) by mouth daily before breakfast. 30 capsule 2  . lisinopril (PRINIVIL,ZESTRIL) 5 MG tablet TAKE 1 TABLET (5 MG TOTAL) BY MOUTH DAILY. 90 tablet 3  . metoprolol succinate (TOPROL-XL) 25 MG 24 hr tablet Take 12.5 mg by mouth daily.     . mycophenolate (CELLCEPT) 500 MG tablet Take 1,000 mg by mouth 2 (two) times daily.    Marland Kitchen  nystatin cream (MYCOSTATIN) Apply 1 application topically 2 (two) times daily. 30 g 0  . ondansetron (ZOFRAN ODT) 4 MG disintegrating tablet Take 1 tablet (4 mg total) by mouth every 8 (eight) hours as needed for nausea or vomiting. 20 tablet 0  . oxyCODONE-acetaminophen (PERCOCET/ROXICET) 5-325 MG tablet Take 1 tablet by mouth every 8 (eight) hours as needed for severe pain. 65 tablet 0  . OXYGEN Inhale 2 mLs into the lungs at bedtime.    . pantoprazole (PROTONIX) 40 MG tablet Take 40 mg by mouth 2 (two) times daily.    . Polyvinyl Alcohol-Povidone (REFRESH OP) Place 2 drops into both eyes daily as needed (dry eyes).     . potassium chloride 20 MEQ/15ML (10%) SOLN Take 15 mLs (20 mEq total) by mouth daily. On days  taking furosemide 240 mL 3  . predniSONE (DELTASONE) 5 MG tablet Take 5 mg by mouth daily.     . promethazine (PHENERGAN) 12.5 MG tablet Take 1 tablet (12.5 mg total) by mouth every 8 (eight) hours as needed for nausea or vomiting. 20 tablet 5  . Saccharomyces boulardii (PROBIOTIC) 250 MG CAPS Take 1 tablet by mouth daily. 14 capsule   . sterile water for irrigation 237 mL Give 2.5 mL/hr by tube continuous.    . sucralfate (CARAFATE) 1 GM/10ML suspension Take 1 g by mouth 4 (four) times daily. Before meals and nightly    . Syringe/Needle, Disp, (SYRINGE 3CC/25GX1") 25G X 1" 3 ML MISC Use for b12 injections 50 each 0  . Tadalafil, PAH, (ADCIRCA) 20 MG TABS Take 40 mg by mouth daily.    . metoCLOPramide (REGLAN) 10 MG tablet TAKE 1 TABLET (10 MG TOTAL) BY MOUTH 4 (FOUR) TIMES DAILY - BEFORE MEALS AND AT BEDTIME. 360 tablet 1   No facility-administered medications prior to visit.     Review of Systems;  Patient denies headache, fevers, malaise, unintentional weight loss, skin rash, eye pain, sinus congestion and sinus pain, sore throat, dysphagia,  hemoptysis , cough, dyspnea, wheezing, chest pain, palpitations, orthopnea, edema, abdominal pain, nausea, melena, diarrhea, constipation, flank pain, dysuria, hematuria, urinary  Frequency, nocturia, numbness, tingling, seizures,  Focal weakness, Loss of consciousness,  Tremor, insomnia, depression, anxiety, and suicidal ideation.      Objective:  BP 114/72 (BP Location: Right Arm, Patient Position: Sitting, Cuff Size: Normal)   Pulse (!) 118   Temp 97.7 F (36.5 C) (Oral)   Resp 16   Ht 5\' 3"  (1.6 m)   Wt 129 lb 9.6 oz (58.8 kg)   SpO2 95%   BMI 22.96 kg/m   BP Readings from Last 3 Encounters:  12/13/17 114/72  12/07/17 127/69  12/02/17 125/66    Wt Readings from Last 3 Encounters:  12/13/17 129 lb 9.6 oz (58.8 kg)  12/07/17 135 lb (61.2 kg)  12/02/17 133 lb 6.1 oz (60.5 kg)    General appearance: alert, cooperative and appears  stated age Ears: normal TM's and external ear canals both ears Throat: lips, mucosa, and tongue normal; teeth and gums normal Neck: no adenopathy, no carotid bruit, supple, symmetrical, trachea midline and thyroid not enlarged, symmetric, no tenderness/mass/nodules Back: symmetric, no curvature. ROM normal. No CVA tenderness. Lungs: clear to auscultation bilaterally Heart: regular rate and rhythm, S1, S2 normal, no murmur, click, rub or gallop Abdomen: soft, non-tender; bowel sounds normal; no masses,  no organomegaly Pulses: 2+ and symmetric Skin: Skin color, texture, turgor normal. No rashes or lesions Lymph nodes: Cervical, supraclavicular, and axillary  nodes normal.  Lab Results  Component Value Date   HGBA1C 6.2 10/18/2017   HGBA1C 6.3 05/17/2017   HGBA1C 5.8 09/14/2016    Lab Results  Component Value Date   CREATININE 0.97 12/02/2017   CREATININE 1.25 (H) 11/30/2017   CREATININE 1.81 (H) 11/28/2017    Lab Results  Component Value Date   WBC 4.4 11/30/2017   HGB 8.8 (L) 11/30/2017   HCT 27.9 (L) 11/30/2017   PLT 309 11/30/2017   GLUCOSE 106 (H) 12/02/2017   CHOL 164 10/11/2013   TRIG 152.0 (H) 10/11/2013   HDL 69.70 10/11/2013   LDLDIRECT 50.0 05/09/2016   LDLCALC 64 10/11/2013   ALT 10 (L) 11/28/2017   AST 27 11/28/2017   NA 148 (H) 12/02/2017   K 3.3 (L) 12/02/2017   CL 123 (H) 12/02/2017   CREATININE 0.97 12/02/2017   BUN 10 12/02/2017   CO2 20 (L) 12/02/2017   TSH 0.058 (L) 11/29/2017   INR 1.2 12/07/2017   HGBA1C 6.2 10/18/2017   MICROALBUR 8.7 (H) 09/14/2016    Ct Abdomen Pelvis Wo Contrast  Result Date: 11/28/2017 CLINICAL DATA:  Weakness. Recurrent vomiting and diarrhea today. History of breast cancer, gastric volvulus, splenectomy. EXAM: CT ABDOMEN AND PELVIS WITHOUT CONTRAST TECHNIQUE: Multidetector CT imaging of the abdomen and pelvis was performed following the standard protocol without IV contrast. COMPARISON:  CT abdomen and pelvis June 08, 2015 and abdominal radiograph October 18, 2017 FINDINGS: LOWER CHEST: Bilateral lower lobe atelectasis. Borderline cardiomegaly. Moderate coronary artery calcification. No pericardial effusion. HEPATOBILIARY: Normal. PANCREAS: Atrophic, nonacute. SPLEEN: Normal. ADRENALS/URINARY TRACT: Ptotic right kidney. Kidneys demonstrate normal size and morphology. Punctate RIGHT lower pole nephrolithiasis. No hydronephrosis; limited assessment for renal masses on this nonenhanced examination. 12 mm RIGHT lower pole cyst. Stable 2.9 cm homogeneously hypodense cyst upper pole LEFT kidney. Subcentimeter exophytic cyst lower pole LEFT kidney. The unopacified ureters are normal in course and caliber. Urinary bladder is well distended and unremarkable. Normal adrenal glands. STOMACH/BOWEL: Large hiatal hernia with the majority of the stomach in the chest. Severe descending and sigmoid colonic diverticulosis with superimposed short segment of proximal sigmoid bowel wall thickening and pericolonic inflammation. Small and large bowel air-fluid levels. Small bowel are normal in course and caliber without inflammatory changes, sensitivity decreased by lack of enteric contrast. Normal appendix. VASCULAR/LYMPHATIC: Moderate calcific atherosclerosis, tortuous aorta. No lymphadenopathy by CT size criteria. REPRODUCTIVE: Normal. OTHER: No intraperitoneal free fluid or free air. MUSCULOSKELETAL: Non-acute. Osteopenia. Broad thoracolumbar dextroscoliosis, status post L1 and L2 vertebral body cement augmentation without further height loss. Stable linear sclerosis in central sacrum, possible old insufficiency injury. Anterior abdominal wall ligamentous laxity. Multiple small to moderate fat containing supraumbilical ventral hernias. IMPRESSION: 1. Acute sigmoid diverticulitis without complication. Mild enteritis. 2. Large hiatal hernia. 3. Punctate nonobstructing RIGHT nephrolithiasis. Aortic Atherosclerosis (ICD10-I70.0). Electronically  Signed   By: Awilda Metro M.D.   On: 11/28/2017 21:13    Assessment & Plan:   Problem List Items Addressed This Visit    Protein-calorie malnutrition (HCC)    Recurrent secondary to diverticulitis  .  Lab Results  Component Value Date   LABPROT 12.3 (H) 12/07/2017   Lab Results  Component Value Date   LABPROT 12.3 (H) 12/07/2017         Hospital discharge follow-up    Patient is stable post discharge and has no new issues or questions about discharge plans at the visit today for hospital follow up. All labs , imaging studies and progress  notes from admission were reviewed with patient today        Chronic venous embolism and thrombosis of deep vessels of lower extremity (HCC)    Coumadin resumed post discharge       Acute diverticulitis    Sigmoid,  With repeat CT done during admission to rule out perforation .  She has finished the antibiotics but her dairrhea has persisted,  Advised to stop using the Linzess.       Absolute anemia    Secondary to recurrent GI bleeds.  Markedly improved with iv iron infusions, but worsened during admission , likely due to hemodilution.  Will repeat studies .   CBC Latest Ref Rng & Units 11/30/2017 11/28/2017 05/26/2017  WBC 3.6 - 11.0 K/uL 4.4 6.4 8.1  Hemoglobin 12.0 - 16.0 g/dL 1.6(X) 11.7(L) 10.7(L)  Hematocrit 35.0 - 47.0 % 27.9(L) 35.5 32.4(L)  Platelets 150 - 440 K/uL 309 349 333              I have changed Kaiulani B. Gills's metoCLOPramide. I am also having her maintain her hydroxychloroquine, tadalafil (PAH), predniSONE, metoprolol succinate, mycophenolate, Polyvinyl Alcohol-Povidone (REFRESH OP), OXYGEN, sterile water for irrigation 237 mL, Calcium Carbonate-Vitamin D, pantoprazole, cyanocobalamin, SYRINGE 3CC/25GX1", vitamin C, sucralfate, levothyroxine, lisinopril, promethazine, nystatin cream, linaclotide, Lactulose, oxyCODONE-acetaminophen, ondansetron, Probiotic, furosemide, potassium chloride, and letrozole.  Meds  ordered this encounter  Medications  . metoCLOPramide (REGLAN) 10 MG tablet    Sig: Take 1 tablet (10 mg total) by mouth 4 (four) times daily -  before meals and at bedtime.    Dispense:  360 tablet    Refill:  1    Medications Discontinued During This Encounter  Medication Reason  . metoCLOPramide (REGLAN) 10 MG tablet Reorder    Follow-up: Return in about 2 months (around 02/12/2018) for follow up diabetes, early may .   Sherlene Shams, MD

## 2017-12-13 NOTE — Patient Instructions (Signed)
suspend linzess until you have a 24 hour period without stooling  Stop the furosemide  Use the nausea  Medication    If pulse remains  Greater 105 one hoyr after taking the metoprolol dose   (at rest),  increase the dose  to 25 mg .  Ask the RN to check BMET , CBC and PT INR next Monday

## 2017-12-16 DIAGNOSIS — Z09 Encounter for follow-up examination after completed treatment for conditions other than malignant neoplasm: Secondary | ICD-10-CM | POA: Insufficient documentation

## 2017-12-16 NOTE — Assessment & Plan Note (Signed)
Coumadin resumed post discharge

## 2017-12-16 NOTE — Assessment & Plan Note (Addendum)
Sigmoid,  With repeat CT done during admission to rule out perforation .  She has finished the antibiotics but her dairrhea has persisted,  Advised to stop using the Linzess.

## 2017-12-16 NOTE — Assessment & Plan Note (Signed)
Patient is stable post discharge and has no new issues or questions about discharge plans at the visit today for hospital follow up. All labs , imaging studies and progress notes from admission were reviewed with patient today   

## 2017-12-16 NOTE — Assessment & Plan Note (Signed)
Secondary to recurrent GI bleeds.  Markedly improved with iv iron infusions, but worsened during admission , likely due to hemodilution.  Will repeat studies .   CBC Latest Ref Rng & Units 11/30/2017 11/28/2017 05/26/2017  WBC 3.6 - 11.0 K/uL 4.4 6.4 8.1  Hemoglobin 12.0 - 16.0 g/dL 8.8(L) 11.7(L) 10.7(L)  Hematocrit 35.0 - 47.0 % 27.9(L) 35.5 32.4(L)  Platelets 150 - 440 K/uL [phone]

## 2017-12-16 NOTE — Assessment & Plan Note (Signed)
Recurrent secondary to diverticulitis  .  Lab Results  Component Value Date   LABPROT 12.3 (H) 12/07/2017   Lab Results  Component Value Date   LABPROT 12.3 (H) 12/07/2017

## 2017-12-18 ENCOUNTER — Other Ambulatory Visit: Payer: Self-pay

## 2017-12-18 DIAGNOSIS — Z85828 Personal history of other malignant neoplasm of skin: Secondary | ICD-10-CM | POA: Diagnosis not present

## 2017-12-18 DIAGNOSIS — D0462 Carcinoma in situ of skin of left upper limb, including shoulder: Secondary | ICD-10-CM | POA: Diagnosis not present

## 2017-12-18 DIAGNOSIS — X32XXXA Exposure to sunlight, initial encounter: Secondary | ICD-10-CM | POA: Diagnosis not present

## 2017-12-18 DIAGNOSIS — L570 Actinic keratosis: Secondary | ICD-10-CM | POA: Diagnosis not present

## 2017-12-18 DIAGNOSIS — D0472 Carcinoma in situ of skin of left lower limb, including hip: Secondary | ICD-10-CM | POA: Diagnosis not present

## 2017-12-18 DIAGNOSIS — Z08 Encounter for follow-up examination after completed treatment for malignant neoplasm: Secondary | ICD-10-CM | POA: Diagnosis not present

## 2017-12-18 DIAGNOSIS — L89319 Pressure ulcer of right buttock, unspecified stage: Secondary | ICD-10-CM | POA: Diagnosis not present

## 2017-12-18 DIAGNOSIS — L111 Transient acantholytic dermatosis [Grover]: Secondary | ICD-10-CM | POA: Diagnosis not present

## 2017-12-18 DIAGNOSIS — D0439 Carcinoma in situ of skin of other parts of face: Secondary | ICD-10-CM | POA: Diagnosis not present

## 2017-12-18 DIAGNOSIS — D485 Neoplasm of uncertain behavior of skin: Secondary | ICD-10-CM | POA: Diagnosis not present

## 2017-12-18 DIAGNOSIS — M3213 Lung involvement in systemic lupus erythematosus: Secondary | ICD-10-CM

## 2017-12-19 ENCOUNTER — Inpatient Hospital Stay: Payer: Medicare Other | Attending: Oncology

## 2017-12-19 ENCOUNTER — Inpatient Hospital Stay (HOSPITAL_BASED_OUTPATIENT_CLINIC_OR_DEPARTMENT_OTHER): Payer: Medicare Other | Admitting: Oncology

## 2017-12-19 VITALS — BP 105/67 | HR 86 | Temp 98.2°F | Resp 18 | Ht 63.0 in | Wt 130.0 lb

## 2017-12-19 DIAGNOSIS — G629 Polyneuropathy, unspecified: Secondary | ICD-10-CM | POA: Insufficient documentation

## 2017-12-19 DIAGNOSIS — K439 Ventral hernia without obstruction or gangrene: Secondary | ICD-10-CM | POA: Insufficient documentation

## 2017-12-19 DIAGNOSIS — E039 Hypothyroidism, unspecified: Secondary | ICD-10-CM | POA: Insufficient documentation

## 2017-12-19 DIAGNOSIS — I509 Heart failure, unspecified: Secondary | ICD-10-CM | POA: Diagnosis not present

## 2017-12-19 DIAGNOSIS — Z7901 Long term (current) use of anticoagulants: Secondary | ICD-10-CM | POA: Diagnosis not present

## 2017-12-19 DIAGNOSIS — Z9081 Acquired absence of spleen: Secondary | ICD-10-CM | POA: Insufficient documentation

## 2017-12-19 DIAGNOSIS — N200 Calculus of kidney: Secondary | ICD-10-CM | POA: Diagnosis not present

## 2017-12-19 DIAGNOSIS — D539 Nutritional anemia, unspecified: Secondary | ICD-10-CM | POA: Diagnosis not present

## 2017-12-19 DIAGNOSIS — C50412 Malignant neoplasm of upper-outer quadrant of left female breast: Secondary | ICD-10-CM

## 2017-12-19 DIAGNOSIS — K5732 Diverticulitis of large intestine without perforation or abscess without bleeding: Secondary | ICD-10-CM | POA: Insufficient documentation

## 2017-12-19 DIAGNOSIS — E785 Hyperlipidemia, unspecified: Secondary | ICD-10-CM | POA: Insufficient documentation

## 2017-12-19 DIAGNOSIS — R937 Abnormal findings on diagnostic imaging of other parts of musculoskeletal system: Secondary | ICD-10-CM

## 2017-12-19 DIAGNOSIS — J90 Pleural effusion, not elsewhere classified: Secondary | ICD-10-CM | POA: Insufficient documentation

## 2017-12-19 DIAGNOSIS — N281 Cyst of kidney, acquired: Secondary | ICD-10-CM | POA: Diagnosis not present

## 2017-12-19 DIAGNOSIS — M8580 Other specified disorders of bone density and structure, unspecified site: Secondary | ICD-10-CM

## 2017-12-19 DIAGNOSIS — I700 Atherosclerosis of aorta: Secondary | ICD-10-CM | POA: Insufficient documentation

## 2017-12-19 DIAGNOSIS — I272 Pulmonary hypertension, unspecified: Secondary | ICD-10-CM | POA: Insufficient documentation

## 2017-12-19 DIAGNOSIS — Z79899 Other long term (current) drug therapy: Secondary | ICD-10-CM | POA: Insufficient documentation

## 2017-12-19 DIAGNOSIS — Z79811 Long term (current) use of aromatase inhibitors: Secondary | ICD-10-CM | POA: Insufficient documentation

## 2017-12-19 DIAGNOSIS — M329 Systemic lupus erythematosus, unspecified: Secondary | ICD-10-CM | POA: Diagnosis not present

## 2017-12-19 DIAGNOSIS — M3213 Lung involvement in systemic lupus erythematosus: Secondary | ICD-10-CM

## 2017-12-19 DIAGNOSIS — K449 Diaphragmatic hernia without obstruction or gangrene: Secondary | ICD-10-CM | POA: Diagnosis not present

## 2017-12-19 DIAGNOSIS — Z13820 Encounter for screening for osteoporosis: Secondary | ICD-10-CM

## 2017-12-19 DIAGNOSIS — Z170 Estrogen receptor positive status [ER+]: Secondary | ICD-10-CM | POA: Diagnosis not present

## 2017-12-19 DIAGNOSIS — I110 Hypertensive heart disease with heart failure: Secondary | ICD-10-CM | POA: Diagnosis not present

## 2017-12-19 DIAGNOSIS — K219 Gastro-esophageal reflux disease without esophagitis: Secondary | ICD-10-CM | POA: Diagnosis not present

## 2017-12-19 DIAGNOSIS — I4891 Unspecified atrial fibrillation: Secondary | ICD-10-CM | POA: Diagnosis not present

## 2017-12-19 DIAGNOSIS — Z87891 Personal history of nicotine dependence: Secondary | ICD-10-CM | POA: Insufficient documentation

## 2017-12-19 LAB — COMPREHENSIVE METABOLIC PANEL
ALK PHOS: 48 U/L (ref 38–126)
ALT: 9 U/L — AB (ref 14–54)
AST: 22 U/L (ref 15–41)
Albumin: 3.4 g/dL — ABNORMAL LOW (ref 3.5–5.0)
Anion gap: 10 (ref 5–15)
BUN: 29 mg/dL — AB (ref 6–20)
CALCIUM: 9.1 mg/dL (ref 8.9–10.3)
CHLORIDE: 107 mmol/L (ref 101–111)
CO2: 23 mmol/L (ref 22–32)
CREATININE: 1.74 mg/dL — AB (ref 0.44–1.00)
GFR, EST AFRICAN AMERICAN: 30 mL/min — AB (ref >60)
GFR, EST NON AFRICAN AMERICAN: 26 mL/min — AB (ref >60)
Glucose, Bld: 102 mg/dL — ABNORMAL HIGH (ref 65–99)
Potassium: 4.9 mmol/L (ref 3.5–5.1)
SODIUM: 140 mmol/L (ref 135–145)
Total Bilirubin: 0.3 mg/dL (ref 0.3–1.2)
Total Protein: 7.3 g/dL (ref 6.5–8.1)

## 2017-12-19 LAB — CBC WITH DIFFERENTIAL/PLATELET
BASOS ABS: 0.1 10*3/uL (ref 0–0.1)
Basophils Relative: 1 %
Eosinophils Absolute: 0.2 10*3/uL (ref 0–0.7)
Eosinophils Relative: 4 %
HCT: 30.9 % — ABNORMAL LOW (ref 35.0–47.0)
Hemoglobin: 9.9 g/dL — ABNORMAL LOW (ref 12.0–16.0)
LYMPHS ABS: 1.5 10*3/uL (ref 1.0–3.6)
LYMPHS PCT: 26 %
MCH: 32.1 pg (ref 26.0–34.0)
MCHC: 32 g/dL (ref 32.0–36.0)
MCV: 100.2 fL — AB (ref 80.0–100.0)
Monocytes Absolute: 0.6 10*3/uL (ref 0.2–0.9)
Monocytes Relative: 11 %
NEUTROS PCT: 58 %
Neutro Abs: 3.5 10*3/uL (ref 1.4–6.5)
PLATELETS: 369 10*3/uL (ref 150–440)
RBC: 3.09 MIL/uL — AB (ref 3.80–5.20)
RDW: 15.4 % — ABNORMAL HIGH (ref 11.5–14.5)
WBC: 5.9 10*3/uL (ref 3.6–11.0)

## 2017-12-19 MED ORDER — LETROZOLE 2.5 MG PO TABS: 2.5000 mg | ORAL_TABLET | Freq: Every day | ORAL | 9 refills | Status: DC

## 2017-12-20 ENCOUNTER — Ambulatory Visit: Payer: Self-pay

## 2017-12-20 ENCOUNTER — Other Ambulatory Visit: Payer: Self-pay | Admitting: Family Medicine

## 2017-12-20 ENCOUNTER — Telehealth: Payer: Self-pay | Admitting: Internal Medicine

## 2017-12-20 DIAGNOSIS — R3129 Other microscopic hematuria: Secondary | ICD-10-CM

## 2017-12-20 MED ORDER — DIPHENHYDRAMINE HCL 50 MG PO TABS: 50.0000 mg | ORAL_TABLET | Freq: Once | ORAL | 0 refills | Status: DC

## 2017-12-20 MED ORDER — RANITIDINE HCL 150 MG PO TABS: 150.0000 mg | ORAL_TABLET | Freq: Once | ORAL | 0 refills | Status: DC

## 2017-12-20 MED ORDER — PREDNISONE 50 MG PO TABS: ORAL_TABLET | ORAL | 0 refills | Status: DC

## 2017-12-20 NOTE — Telephone Encounter (Signed)
fyi

## 2017-12-20 NOTE — Telephone Encounter (Signed)
Copied from [place]. Topic: Quick Communication - See Telephone Encounter >> Dec 20, 2017 11:00 AM Ivar Drape wrote: CRM for notification. See Telephone encounter for: 12/20/17. Patient would like the provider to know that her GFR is 26, and wanted the doctor to look at her labs.

## 2017-12-20 NOTE — Telephone Encounter (Signed)
Pt. Reports she fell off the bed yesterday afternoon while changing clothes. States she "hit right on my tail bone." No pain to tail bone. C/o sternum pain and also if she moves arms and when she takes a deep breath. No availability in office per Alwyn Ren. Offered pt. An appointment at [facility] location and she refused. Instructed pt. To go to [facility] or ED for evaluation. Verbalizes understanding.  Reason for Disposition . [1] High-risk adult (e.g., age > 22, osteoporosis, chronic steroid use) AND [2] still hurts  Answer Assessment - Initial Assessment Questions 1. MECHANISM: "How did the injury happen?"     Yesterday afternoon - fell off her bed while changing clothes 2. ONSET: "When did the injury happen?" (e.g., minutes, hours, days ago)     Yesterday 3. LOCATION: "Where on the chest is the injury located?" "Where does it hurt?"     Sternum 4. CHEST OR RIB PAIN SEVERITY: "How bad is the pain?"  (e.g., Scale 1-10; mild, moderate, or severe)    - MILD (1-3): doesn't interfere with normal activities     - MODERATE (4-7): interferes with normal activities or awakens from sleep    - SEVERE (8-10): excruciating pain, unable to do any normal activities       6 5. BREATHING DIFFICULTY: "Are you having any difficulty breathing?" If so, ask "How bad is it?"  (e.g., none, mild, moderate, severe)  6: OTHER SYMPTOMS (e.g., cough, fever, rash)      Hurts to take a deep breath 7. PREGNANCY: "Is there any chance you are pregnant?" "When was your last menstrual period?"     No  Protocols used: CHEST INJURY - BENDING, LIFTING, OR TWISTING-A-AH

## 2017-12-20 NOTE — Telephone Encounter (Signed)
FYI

## 2017-12-21 ENCOUNTER — Telehealth: Payer: Self-pay | Admitting: Internal Medicine

## 2017-12-21 ENCOUNTER — Ambulatory Visit
Admission: RE | Admit: 2017-12-21 | Discharge: 2017-12-21 | Disposition: A | Discharge status: Home / Self Care | Payer: Medicare Other | Source: Ambulatory Visit | Attending: Urology | Admitting: Urology

## 2017-12-21 ENCOUNTER — Telehealth: Payer: Self-pay | Admitting: *Deleted

## 2017-12-21 ENCOUNTER — Ambulatory Visit: Admission: RE | Admit: 2017-12-21 | Payer: Medicare Other | Source: Ambulatory Visit

## 2017-12-21 ENCOUNTER — Inpatient Hospital Stay: Admission: RE | Admit: 2017-12-21 | Payer: Medicare Other | Source: Ambulatory Visit

## 2017-12-21 DIAGNOSIS — N183 Chronic kidney disease, stage 3 (moderate): Secondary | ICD-10-CM | POA: Diagnosis not present

## 2017-12-21 DIAGNOSIS — K5730 Diverticulosis of large intestine without perforation or abscess without bleeding: Secondary | ICD-10-CM | POA: Diagnosis not present

## 2017-12-21 DIAGNOSIS — K5732 Diverticulitis of large intestine without perforation or abscess without bleeding: Secondary | ICD-10-CM | POA: Diagnosis not present

## 2017-12-21 DIAGNOSIS — N281 Cyst of kidney, acquired: Secondary | ICD-10-CM | POA: Insufficient documentation

## 2017-12-21 DIAGNOSIS — N390 Urinary tract infection, site not specified: Secondary | ICD-10-CM | POA: Diagnosis not present

## 2017-12-21 DIAGNOSIS — R3129 Other microscopic hematuria: Secondary | ICD-10-CM | POA: Diagnosis not present

## 2017-12-21 DIAGNOSIS — I5032 Chronic diastolic (congestive) heart failure: Secondary | ICD-10-CM | POA: Diagnosis not present

## 2017-12-21 DIAGNOSIS — I130 Hypertensive heart and chronic kidney disease with heart failure and stage 1 through stage 4 chronic kidney disease, or unspecified chronic kidney disease: Secondary | ICD-10-CM | POA: Diagnosis not present

## 2017-12-21 NOTE — Telephone Encounter (Signed)
[place], called with lab results.  INR 2.2; taking coumadin 3 mg alternating with 4 mg daily. Just now drawing CBC w/diff and BMET that was ordered to be drawn on 12/18/17, will call back when receive the results.

## 2017-12-21 NOTE — Telephone Encounter (Signed)
INR result 2.2

## 2017-12-21 NOTE — Telephone Encounter (Signed)
Please advise 

## 2017-12-21 NOTE — Progress Notes (Signed)
Hematology/Oncology Consult note [facility]  [phone] [phone]  Patient Care Team: Sherlene Shams, MD as PCP - General (Internal Medicine) Lemar Livings, Merrily Pew, MD (General Surgery) Darci Current, MD as Attending Physician (Emergency Medicine) Pleasant, Dennard Schaumann, RN as [facility] Care Management   Name of the patient: Diana [name]  [id number]  Aug 10, 1935   Date of visit: 12/21/17   Diagnosis- 1. Macrocytic anemia likely secondary to kidney disease and or underlying MDS 2. History of ER/PR positive stage I left breast cancer currently on hormone therapy with letrozole   Chief complaint/ Reason for visit- routine f/u of breast cancer and macrocytic anemia   Heme/Onc history:  Oncology History   83 year old female with stage I (T1 C. N0 M0) invasive mammary carcinoma status post wide local excision and sentinel node biopsy tumor is ER/PR positive HER-2/neu not over expressed with a low Oncotype DX score of 17 for adjuvant whole breast radiation Oncotype DX score is 17 Patient has finished radiation therapy in December of 2015 Started letrozole from December, 2015     Breast cancer of upper-outer quadrant of left female breast (HCC)   05/26/2014 Initial Diagnosis    Breast cancer of upper-outer quadrant of left female breast (HCC), stage I, T1 cN0 M0, ER/PR positive HER-2/neu not overexpressed.      04-24-202015 Oncotype testing    Oncotype DX score 17       - 09/05/2014 Radiation Therapy         09/05/2014 -  Anti-estrogen oral therapy    Started letrozole      Patient also has other chronic medical problems in gastric wall reveals requiring laparotomy and splenectomy in July 2016.  She has a history of SLE/ Sjogrens as well as history ofCMV esophagitis. She is on chronic prednisone therapy secondary to her Sjogrens.  Bone density scan in feb 2018 showed osteopenia in her L spine with a score of -2.1  and -2.2 in her forearm which were significantly decreased compared to prior scan from 2015    Interval history- patient was recently admitted to hospital for diverticulitis and UTI and completed oral antibiotics. She is slowly recovering. She is on coumadin for her dvt and she is being managed by home care. Still feels fatigued. Diarrhea is improving but not completely better  ECOG PS- 2-3 Pain scale- 5   Review of systems- Review of Systems  Constitutional: Positive for malaise/fatigue. Negative for chills, fever and weight loss.  HENT: Negative for congestion, ear discharge and nosebleeds.   Eyes: Negative for blurred vision.  Respiratory: Negative for cough, hemoptysis, sputum production, shortness of breath and wheezing.   Cardiovascular: Negative for chest pain, palpitations, orthopnea and claudication.  Gastrointestinal: Positive for diarrhea and nausea. Negative for abdominal pain, blood in stool, constipation, heartburn, melena and vomiting.  Genitourinary: Negative for dysuria, flank pain, frequency, hematuria and urgency.  Musculoskeletal: Negative for back pain, joint pain and myalgias.  Skin: Negative for rash.  Neurological: Negative for dizziness, tingling, focal weakness, seizures, weakness and headaches.  Endo/Heme/Allergies: Does not bruise/bleed easily.  Psychiatric/Behavioral: Negative for depression and suicidal ideas. The patient does not have insomnia.       Allergies  Allergen Reactions  . Tramadol Other (See Comments) and Rash    Rash  . Amoxicillin Other (See Comments)    Reaction:  Stomach cramps   . Cefuroxime Itching  . Contrast Media [Iodinated Diagnostic Agents] Hives  . Metrizamide Hives  .  Morphine And Related     Reaction: face flushed    . Propoxyphene     Stomach cramp  . Cephalexin Rash  . Sulfa Antibiotics Rash    REACTION: Rash  . Sulfonamide Derivatives Rash  . Tramadol Hcl Rash  . Venlafaxine Rash     Past Medical History:    Diagnosis Date  . Acute glomerulonephritis with other specified pathological lesion in kidney in disease classified elsewhere(580.81)   . Alveolar aeration decreased   . Anemia   . Arthritis   . Atrial fibrillation (HCC)   . Breast cancer (HCC) 08-08-2014   positive, radiation  . Cancer (HCC)    Breast  . CHF (congestive heart failure) (HCC)   . Diaphragmatic hernia   . DVT (deep venous thrombosis) (HCC)   . Dysrhythmia   . Environmental allergies   . Esophageal reflux   . Feeding problem    FEEDING TUBE  . Hiatal hernia   . Hyperlipidemia   . Hypertension   . Lower extremity edema   . Lupus (systemic lupus erythematosus) (HCC)   . Neuropathy   . Osteopenia   . Oxygen deficiency    USES HS  . Peripheral neuropathy, hereditary/idiopathic   . Personal history of radiation therapy   . Pneumonia   . Primary pulmonary hypertension (HCC)   . Pulmonary hypertension (HCC)   . Renal insufficiency   . Sciatica   . Shingles   . Shingles (herpes zoster) polyneuropathy March 2013   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  . Shortness of breath dyspnea   . Unspecified hypothyroidism   . Unspecified menopausal and postmenopausal disorder   . Urinary incontinence without sensory awareness   . Vitamin B deficiency      Past Surgical History:  Procedure Laterality Date  . BREAST BIOPSY Left 2015   invasive mammary carcinoma  . BREAST LUMPECTOMY Left 2015   invasive mammary carcinoma clear margins but close  . BREAST SURGERY    . CATARACT EXTRACTION W/PHACO Right 04/05/2016   Procedure: CATARACT EXTRACTION PHACO AND INTRAOCULAR LENS PLACEMENT (IOC);  Surgeon: Galen Manila, MD;  Location: [facility] ORS;  Service: Ophthalmology;  Laterality: Right;  [place] 01:20 AP% 23.9 CDE 19.29 fluid pack lot # [id number]  . CATARACT EXTRACTION W/PHACO Left 04/26/2016   Procedure: CATARACT EXTRACTION PHACO AND INTRAOCULAR LENS PLACEMENT (IOC);  Surgeon: Galen Manila, MD;  Location: [facility] ORS;  Service:  Ophthalmology;  Laterality: Left;   [place] 00:59 AP% 23.2 CDE 13.83 Fluid pack lot # [id number]  . DIAGNOSTIC LAPAROSCOPY    . ESOPHAGOGASTRODUODENOSCOPY N/A 07/19/2015   Procedure: ESOPHAGOGASTRODUODENOSCOPY (EGD);  Surgeon: Wallace Cullens, MD;  Location: [facility] ENDOSCOPY;  Service: Gastroenterology;  Laterality: N/A;  . ESOPHAGOGASTRODUODENOSCOPY (EGD) WITH PROPOFOL N/A 03/28/2015   Procedure: ESOPHAGOGASTRODUODENOSCOPY (EGD) WITH PROPOFOL;  Surgeon: Earline Mayotte, MD;  Location: [facility] ENDOSCOPY;  Service: Endoscopy;  Laterality: N/A;  . ESOPHAGOGASTRODUODENOSCOPY (EGD) WITH PROPOFOL N/A 06/10/2015   Procedure: ESOPHAGOGASTRODUODENOSCOPY (EGD) WITH PROPOFOL;  Surgeon: Christena Deem, MD;  Location: [facility] ENDOSCOPY;  Service: Endoscopy;  Laterality: N/A;  . ESOPHAGOGASTRODUODENOSCOPY (EGD) WITH PROPOFOL N/A 10/20/2015   Procedure: ESOPHAGOGASTRODUODENOSCOPY (EGD) WITH PROPOFOL;  Surgeon: Christena Deem, MD;  Location: [facility] ENDOSCOPY;  Service: Endoscopy;  Laterality: N/A;  . ESOPHAGOGASTRODUODENOSCOPY (EGD) WITH PROPOFOL N/A 02/23/2016   Procedure: ESOPHAGOGASTRODUODENOSCOPY (EGD) WITH PROPOFOL;  Surgeon: Christena Deem, MD;  Location: [facility] ENDOSCOPY;  Service: Endoscopy;  Laterality: N/A;  . ESOPHAGOGASTRODUODENOSCOPY (EGD) WITH PROPOFOL N/A 06/30/2016   Procedure: ESOPHAGOGASTRODUODENOSCOPY (EGD) WITH  PROPOFOL;  Surgeon: Christena Deem, MD;  Location: [facility] ENDOSCOPY;  Service: Endoscopy;  Laterality: N/A;  . ESOPHAGOGASTRODUODENOSCOPY (EGD) WITH PROPOFOL N/A 01/05/2017   Procedure: ESOPHAGOGASTRODUODENOSCOPY (EGD) WITH PROPOFOL;  Surgeon: Christena Deem, MD;  Location: [facility] ENDOSCOPY;  Service: Endoscopy;  Laterality: N/A;  . KYPHOPLASTY N/A 02/10/2015   Procedure: GNFAOZHYQMV/H8;  Surgeon: Kennedy Bucker, MD;  Location: [facility] ORS;  Service: Orthopedics;  Laterality: N/A;  . LAPAROTOMY N/A 03/11/2015   Procedure: REDUCTION OF GASTRIC VOLVULUS, SPLEENECTOMY, GASTRIC TUBE PLACEMENT;  Surgeon: Earline Mayotte, MD;  Location: [facility] ORS;  Service: General;  Laterality: N/A;  . MOHS SURGERY    . PERIPHERAL VASCULAR CATHETERIZATION N/A 06/12/2015   Procedure: IVC Filter Insertion;  Surgeon: Annice Needy, MD;  Location: [facility] INVASIVE CV LAB;  Service: Cardiovascular;  Laterality: N/A;  . PERIPHERAL VASCULAR CATHETERIZATION N/A 08/23/2016   Procedure: IVC Filter Removal;  Surgeon: Renford Dills, MD;  Location: [facility] INVASIVE CV LAB;  Service: Cardiovascular;  Laterality: N/A;  . STOMACH SURGERY     for volvolus    Social History   Socioeconomic History  . Marital status: Divorced    Spouse name: Not on file  . Number of children: Not on file  . Years of education: Not on file  . Highest education level: Not on file  Occupational History  . 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 Financial resource strain: Not hard at all  . Food insecurity:    Worry: Never true    Inability: Never true  . Transportation needs:    Medical: No    Non-medical: No  Tobacco Use  . Smoking status: Former Smoker    Last attempt to quit: 05/25/1991    Years since quitting: 26.5  . Smokeless tobacco: Never Used  Substance and Sexual Activity  . Alcohol use: No    Frequency: Never    Comment: rare  . Drug use: No  . Sexual activity: Not on file  Lifestyle  . Physical activity:    Days per week: 0 days    Minutes per session: 0 min  . Stress: Not at all  Relationships  . Social connections:    Talks on phone: Patient refused    Gets together: Patient refused    Attends religious service: Patient refused    Active member of club or organization: Patient refused    Attends meetings of clubs or organizations: Patient refused    Relationship status: Patient refused  . Intimate partner violence:    Fear of current or ex partner: No    Emotionally abused: No    Physically abused: No    Forced sexual activity: No  Other Topics Concern  . Not on file    Social History Narrative  . Not on file    Family History  Problem Relation Age of Onset  . Colon cancer Mother   . Alcohol abuse Sister   . Breast cancer Sister 57  . Cancer Brother   . Lung cancer Son      Current Outpatient Medications:  .  Ascorbic Acid (VITAMIN C) 1000 MG tablet, Take 1,000 mg by mouth daily., Disp: , Rfl:  .  Calcium Carbonate-Vitamin D 600-400 MG-UNIT tablet, Take 1 tablet by mouth daily., Disp: , Rfl:  .  cyanocobalamin (,VITAMIN B-12,) 1000 MCG/ML injection, Inject 1 mL (1,000 mcg total) into the muscle every 30 (thirty) days. Pt uses on the 1st  of every month., Disp: 10 mL, Rfl: 1 .  furosemide (LASIX) 20 MG tablet, 1 tablet daily as needed for fluid retention, Disp: 30 tablet, Rfl: 3 .  hydroxychloroquine (PLAQUENIL) 200 MG tablet, Take 200 mg by mouth daily., Disp: , Rfl:  .  letrozole (FEMARA) 2.5 MG tablet, Take 1 tablet (2.5 mg total) by mouth daily., Disp: 30 tablet, Rfl: 9 .  levothyroxine (SYNTHROID, LEVOTHROID) 150 MCG tablet, Take 1 tablet (150 mcg total) by mouth daily., Disp: 90 tablet, Rfl: 3 .  linaclotide (LINZESS) 145 MCG CAPS capsule, Take 1 capsule (145 mcg total) by mouth daily before breakfast., Disp: 30 capsule, Rfl: 2 .  lisinopril (PRINIVIL,ZESTRIL) 5 MG tablet, TAKE 1 TABLET (5 MG TOTAL) BY MOUTH DAILY., Disp: 90 tablet, Rfl: 3 .  metoCLOPramide (REGLAN) 10 MG tablet, Take 1 tablet (10 mg total) by mouth 4 (four) times daily -  before meals and at bedtime., Disp: 360 tablet, Rfl: 1 .  metoprolol succinate (TOPROL-XL) 25 MG 24 hr tablet, Take 12.5 mg by mouth daily. , Disp: , Rfl:  .  mycophenolate (CELLCEPT) 500 MG tablet, Take 1,000 mg by mouth 2 (two) times daily., Disp: , Rfl:  .  ondansetron (ZOFRAN ODT) 4 MG disintegrating tablet, Take 1 tablet (4 mg total) by mouth every 8 (eight) hours as needed for nausea or vomiting., Disp: 20 tablet, Rfl: 0 .  oxyCODONE-acetaminophen (PERCOCET/ROXICET) 5-325 MG tablet, Take 1 tablet by mouth  every 8 (eight) hours as needed for severe pain., Disp: 65 tablet, Rfl: 0 .  OXYGEN, Inhale 2 mLs into the lungs at bedtime., Disp: , Rfl:  .  pantoprazole (PROTONIX) 40 MG tablet, Take 40 mg by mouth 2 (two) times daily., Disp: , Rfl:  .  Polyvinyl Alcohol-Povidone (REFRESH OP), Place 2 drops into both eyes daily as needed (dry eyes). , Disp: , Rfl:  .  potassium chloride 20 MEQ/15ML (10%) SOLN, Take 15 mLs (20 mEq total) by mouth daily. On days taking furosemide, Disp: 240 mL, Rfl: 3 .  predniSONE (DELTASONE) 5 MG tablet, Take 5 mg by mouth daily. , Disp: , Rfl:  .  promethazine (PHENERGAN) 12.5 MG tablet, Take 1 tablet (12.5 mg total) by mouth every 8 (eight) hours as needed for nausea or vomiting., Disp: 20 tablet, Rfl: 5 .  Saccharomyces boulardii (PROBIOTIC) 250 MG CAPS, Take 1 tablet by mouth daily., Disp: 14 capsule, Rfl:  .  sucralfate (CARAFATE) 1 GM/10ML suspension, Take 1 g by mouth 4 (four) times daily. Before meals and nightly, Disp: , Rfl:  .  Syringe/Needle, Disp, (SYRINGE 3CC/25GX1") 25G X 1" 3 ML MISC, Use for b12 injections, Disp: 50 each, Rfl: 0 .  Tadalafil, PAH, (ADCIRCA) 20 MG TABS, Take 40 mg by mouth daily., Disp: , Rfl:  .  Lactulose 20 GM/30ML SOLN, 30 ml every 4 hours until constipation is relieved (Patient not taking: Reported on 12/19/2017), Disp: 236 mL, Rfl: 3 .  nystatin cream (MYCOSTATIN), Apply 1 application topically 2 (two) times daily. (Patient not taking: Reported on 12/19/2017), Disp: 30 g, Rfl: 0 .  sterile water for irrigation 237 mL, Give 2.5 mL/hr by tube continuous., Disp: , Rfl:   Physical exam:  Vitals:   12/19/17 1455  BP: 105/67  Pulse: 86  Resp: 18  Temp: 98.2 F (36.8 C)  TempSrc: Tympanic  SpO2: 95%  Weight: 130 lb (59 kg)  Height: 5\' 3"  (1.6 m)   Physical Exam  Constitutional: She is oriented to person, place, and  time.  Frail thin elderly female sitting in a wheelchair. Appears in no acute distress  HENT:  Head: Normocephalic and  atraumatic.  Eyes: Pupils are equal, round, and reactive to light. EOM are normal.  Neck: Normal range of motion.  Cardiovascular: Normal rate, regular rhythm and normal heart sounds.  Pulmonary/Chest: Effort normal and breath sounds normal.  Abdominal: Soft. Bowel sounds are normal.  Neurological: She is alert and oriented to person, place, and time.  Skin: Skin is warm and dry.     CMP Latest Ref Rng & Units 12/19/2017  Glucose 65 - 99 mg/dL 161(W)  BUN 6 - 20 mg/dL 96(E)  Creatinine 4.54 - 1.00 mg/dL 0.98(J)  Sodium 191 - 478 mmol/L 140  Potassium 3.5 - 5.1 mmol/L 4.9  Chloride 101 - 111 mmol/L 107  CO2 22 - 32 mmol/L 23  Calcium 8.9 - 10.3 mg/dL 9.1  Total Protein 6.5 - 8.1 g/dL 7.3  Total Bilirubin 0.3 - 1.2 mg/dL 0.3  Alkaline Phos 38 - 126 U/L 48  AST 15 - 41 U/L 22  ALT 14 - 54 U/L 9(L)   CBC Latest Ref Rng & Units 12/19/2017  WBC 3.6 - 11.0 K/uL 5.9  Hemoglobin 12.0 - 16.0 g/dL 2.9(F)  Hematocrit 62.1 - 47.0 % 30.9(L)  Platelets 150 - 440 K/uL 369    No images are attached to the encounter.  Ct Abdomen Pelvis Wo Contrast  Result Date: 12/01/2017 CLINICAL DATA:  Acute sigmoid diverticulitis, evaluate for bowel perforation EXAM: CT ABDOMEN AND PELVIS WITHOUT CONTRAST TECHNIQUE: Multidetector CT imaging of the abdomen and pelvis was performed following the standard protocol without IV contrast. COMPARISON:  11/28/2017. FINDINGS: Lower chest: Trace bilateral pleural effusions. Mild bilateral lower lobe atelectasis. Hepatobiliary: Unenhanced liver is grossly unremarkable. Gallbladder is unremarkable. No intrahepatic or extrahepatic ductal dilatation. Pancreas: Parenchymal atrophy.  No ductal dilatation or mass. Spleen: Surgically absent. Adrenals/Urinary Tract: Adrenal glands are within normal limits. 3.3 cm lateral left upper pole renal cyst (series 2/image 22). 2 mm nonobstructing right lower pole renal calculus (series 2/image 45). 12 mm right lower pole renal cyst (series  2/image 48). No hydronephrosis. Bladder is within normal limits. Stomach/Bowel: Stomach is notable for a large hiatal hernia/inverted intrathoracic stomach. No evidence of bowel obstruction. Appendix is not discretely visualized. Acute sigmoid diverticulitis. Mild pericolonic inflammatory changes (for example, series 2/image 54). No drainable fluid collection/abscess. No free air to suggest macroscopic perforation. Vascular/Lymphatic: No evidence of abdominal aortic aneurysm. Atherosclerotic calcifications of the abdominal aorta and branch vessels. No suspicious abdominopelvic lymphadenopathy. Reproductive: Uterus is within normal limits. No adnexal masses. Other: No abdominopelvic ascites. Musculoskeletal: Lumbar dextroscoliosis with degenerative changes. Prior vertebral augmentation at L1 and L2. IMPRESSION: Acute sigmoid diverticulitis. No drainable fluid collection/abscess. No free air to suggest macroscopic perforation. Additional stable ancillary findings as above. Electronically Signed   By: Charline Bills M.D.   On: 12/01/2017 11:40   Ct Abdomen Pelvis Wo Contrast  Result Date: 11/28/2017 CLINICAL DATA:  Weakness. Recurrent vomiting and diarrhea today. History of breast cancer, gastric volvulus, splenectomy. EXAM: CT ABDOMEN AND PELVIS WITHOUT CONTRAST TECHNIQUE: Multidetector CT imaging of the abdomen and pelvis was performed following the standard protocol without IV contrast. COMPARISON:  CT abdomen and pelvis June 08, 2015 and abdominal radiograph October 18, 2017 FINDINGS: LOWER CHEST: Bilateral lower lobe atelectasis. Borderline cardiomegaly. Moderate coronary artery calcification. No pericardial effusion. HEPATOBILIARY: Normal. PANCREAS: Atrophic, nonacute. SPLEEN: Normal. ADRENALS/URINARY TRACT: Ptotic right kidney. Kidneys demonstrate normal size and morphology. Punctate  RIGHT lower pole nephrolithiasis. No hydronephrosis; limited assessment for renal masses on this nonenhanced  examination. 12 mm RIGHT lower pole cyst. Stable 2.9 cm homogeneously hypodense cyst upper pole LEFT kidney. Subcentimeter exophytic cyst lower pole LEFT kidney. The unopacified ureters are normal in course and caliber. Urinary bladder is well distended and unremarkable. Normal adrenal glands. STOMACH/BOWEL: Large hiatal hernia with the majority of the stomach in the chest. Severe descending and sigmoid colonic diverticulosis with superimposed short segment of proximal sigmoid bowel wall thickening and pericolonic inflammation. Small and large bowel air-fluid levels. Small bowel are normal in course and caliber without inflammatory changes, sensitivity decreased by lack of enteric contrast. Normal appendix. VASCULAR/LYMPHATIC: Moderate calcific atherosclerosis, tortuous aorta. No lymphadenopathy by CT size criteria. REPRODUCTIVE: Normal. OTHER: No intraperitoneal free fluid or free air. MUSCULOSKELETAL: Non-acute. Osteopenia. Broad thoracolumbar dextroscoliosis, status post L1 and L2 vertebral body cement augmentation without further height loss. Stable linear sclerosis in central sacrum, possible old insufficiency injury. Anterior abdominal wall ligamentous laxity. Multiple small to moderate fat containing supraumbilical ventral hernias. IMPRESSION: 1. Acute sigmoid diverticulitis without complication. Mild enteritis. 2. Large hiatal hernia. 3. Punctate nonobstructing RIGHT nephrolithiasis. Aortic Atherosclerosis (ICD10-I70.0). Electronically Signed   By: Awilda Metro M.D.   On: 11/28/2017 21:13     Assessment and plan- Patient is a 83 y.o. female with following issues:  1. Stage I breast cancer- she is currently on letrozole and cal/ vit D. I am concerned about her worsenign osteopenia and fall risk given her age, frailty and ongoing steroid use. She does not want to try Iv bisphosphonates. I will repeat her bone density in about a month or so after she recovers from her recent hospitalization. She  cannot be switched to tamoxifen due to h/o recurrent DVT. But since she is already anticoagulated that might still be an option to consider if her osteopenia is worse.  2. Macrocytic anemia- possible MDS versus anemia of chronic kidney disease. Will try to get ferritin iron studies b12 and folate if possible when she gets her INR checked. Otherwise I will check these labs along with cbc, cmp and see her back in 6 months   Visit Diagnosis 1. Malignant neoplasm of upper-outer quadrant of left female breast, unspecified estrogen receptor status (HCC)   2. Macrocytic anemia   3. Screening for osteoporosis   4. High risk medication use   5. Abnormal bone density screening      Dr. Owens Shark, MD, MPH [facility] [phone] 12/21/2017 4:06 PM

## 2017-12-21 NOTE — Telephone Encounter (Signed)
Coumadin level is therapeutic,  Continue current regimen and repeat PT/INR in 2 weeks

## 2017-12-21 NOTE — Telephone Encounter (Signed)
Faxed an order for pt to have bone density in next 2 weeks. Asked that the office call pt with date and time or to arrange the test to be done. They are agreeable

## 2017-12-22 ENCOUNTER — Inpatient Hospital Stay: Payer: Medicare Other | Admitting: Internal Medicine

## 2017-12-22 ENCOUNTER — Telehealth: Payer: Self-pay | Admitting: *Deleted

## 2017-12-22 LAB — CBC AND DIFFERENTIAL
HCT: 32 — AB (ref 36–46)
HEMOGLOBIN: 9.9 — AB (ref 12.0–16.0)
Platelets: 412 — AB (ref 150–399)
WBC: 5.8

## 2017-12-22 LAB — BASIC METABOLIC PANEL
BUN: 24 — AB (ref 4–21)
Creatinine: 1.3 — AB (ref 0.5–1.1)
GLUCOSE: 96
POTASSIUM: 4.7 (ref 3.4–5.3)
Sodium: 146 (ref 137–147)

## 2017-12-22 NOTE — Telephone Encounter (Signed)
[place] [facility] was contacted and correct information was given.

## 2017-12-22 NOTE — Telephone Encounter (Signed)
Spoke with both pt and Inez Catalina, RN with [facility] and informed them of the INR results and that pt is to continue current coumadin regimen. Also let Inez Catalina know that pt needs to have her INR checked again in two weeks. Inez Catalina stated that they would get that done.

## 2017-12-22 NOTE — Telephone Encounter (Signed)
[facility] called and states that they need a different code for her BMD test. States last time done they used Post menopausal Estrogen deficiency. Please return her call [phone]

## 2017-12-25 ENCOUNTER — Telehealth: Payer: Self-pay | Admitting: Internal Medicine

## 2017-12-25 ENCOUNTER — Encounter: Payer: Self-pay | Admitting: Internal Medicine

## 2017-12-25 ENCOUNTER — Ambulatory Visit (INDEPENDENT_AMBULATORY_CARE_PROVIDER_SITE_OTHER): Payer: Medicare Other | Admitting: Urology

## 2017-12-25 ENCOUNTER — Encounter: Payer: Self-pay | Admitting: Urology

## 2017-12-25 VITALS — BP 128/78 | HR 105 | Resp 16 | Ht 61.0 in | Wt 125.6 lb

## 2017-12-25 DIAGNOSIS — R350 Frequency of micturition: Secondary | ICD-10-CM

## 2017-12-25 NOTE — Telephone Encounter (Signed)
Patient notified of results by [email or web address]

## 2017-12-25 NOTE — Progress Notes (Signed)
12/25/2017 1:44 PM   CAMALA BONIFAS September 02, 1935 [id number]  Referring provider: Sherlene Shams, MD [address] [place], [place] [address]  No chief complaint on file.   HPI: I was consulted to assess the patient for possible prolapse. In the last several months with she feels a suprapubic pressure but no vaginal bulging. She voids every 2 or 3 hours. She gets up 4 times at night. Her flow is reasonable but she can double void a similar amount. She does normally feel empty  She may have had retention or incontinence when she saw Dr. Achilles Dunk a few years ago with shingles but now is continent.  She has had back procedures.She has had a hysterectomy.  Grade 2 cystocele with no rectocele. No loss of cuffsupport.  The patient has suprapubic pressure. She has a double voiding pattern. She has significant nocturia. Urine was sent for culture. In my opinion the patient suprapubic pressure is not from prolapse. Her prolapse was minimal and likely asymptomatic. Rarely atrophy can cause the symptoms but I did not recommend local estrogen cream. I will call if the urine culture is positive. The role of cystoscopy was discussed.Cystoscopy in the culture are normal one could make the case of a CT scan  Urine culture negative.  Suprapubic pressure is much better and only mildly present.  On pelvic examination no prolapse noted  Cysto: normal; patient chose to have CT scan  TOday Urine culture was negative.  CT scan without contrast negative Frequency stable Abdominal pressure minimal     PMH: Past Medical History:  Diagnosis Date  . Acute glomerulonephritis with other specified pathological lesion in kidney in disease classified elsewhere(580.81)   . Alveolar aeration decreased   . Anemia   . Arthritis   . Atrial fibrillation (HCC)   . Breast cancer (HCC) 2020/04/714   positive, radiation  . Cancer (HCC)    Breast  . CHF (congestive heart failure)  (HCC)   . Diaphragmatic hernia   . DVT (deep venous thrombosis) (HCC)   . Dysrhythmia   . Environmental allergies   . Esophageal reflux   . Feeding problem    FEEDING TUBE  . Hiatal hernia   . Hyperlipidemia   . Hypertension   . Lower extremity edema   . Lupus (systemic lupus erythematosus) (HCC)   . Neuropathy   . Osteopenia   . Oxygen deficiency    USES HS  . Peripheral neuropathy, hereditary/idiopathic   . Personal history of radiation therapy   . Pneumonia   . Primary pulmonary hypertension (HCC)   . Pulmonary hypertension (HCC)   . Renal insufficiency   . Sciatica   . Shingles   . Shingles (herpes zoster) polyneuropathy March 2013   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  . Shortness of breath dyspnea   . Unspecified hypothyroidism   . Unspecified menopausal and postmenopausal disorder   . Urinary incontinence without sensory awareness   . Vitamin B deficiency     Surgical History: Past Surgical History:  Procedure Laterality Date  . BREAST BIOPSY Left 2015   invasive mammary carcinoma  . BREAST LUMPECTOMY Left 2015   invasive mammary carcinoma clear margins but close  . BREAST SURGERY    . CATARACT EXTRACTION W/PHACO Right 04/05/2016   Procedure: CATARACT EXTRACTION PHACO AND INTRAOCULAR LENS PLACEMENT (IOC);  Surgeon: Galen Manila, MD;  Location: [facility] ORS;  Service: Ophthalmology;  Laterality: Right;  [place] 01:20 AP% 23.9 CDE 19.29 fluid pack lot # [id number]  .  CATARACT EXTRACTION W/PHACO Left 04/26/2016   Procedure: CATARACT EXTRACTION PHACO AND INTRAOCULAR LENS PLACEMENT (IOC);  Surgeon: Galen Manila, MD;  Location: [facility];  Service: Ophthalmology;  Laterality: Left;   [place] 00:59 AP% 23.2 CDE 13.83 Fluid pack lot # [id number]  . DIAGNOSTIC LAPAROSCOPY    . ESOPHAGOGASTRODUODENOSCOPY N/A 07/19/2015   Procedure: ESOPHAGOGASTRODUODENOSCOPY (EGD);  Surgeon: Wallace Cullens, MD;  Location: [facility] ENDOSCOPY;  Service: Gastroenterology;  Laterality: N/A;  .  ESOPHAGOGASTRODUODENOSCOPY (EGD) WITH PROPOFOL N/A 03/28/2015   Procedure: ESOPHAGOGASTRODUODENOSCOPY (EGD) WITH PROPOFOL;  Surgeon: Earline Mayotte, MD;  Location: [facility] ENDOSCOPY;  Service: Endoscopy;  Laterality: N/A;  . ESOPHAGOGASTRODUODENOSCOPY (EGD) WITH PROPOFOL N/A 06/10/2015   Procedure: ESOPHAGOGASTRODUODENOSCOPY (EGD) WITH PROPOFOL;  Surgeon: Christena Deem, MD;  Location: [facility] ENDOSCOPY;  Service: Endoscopy;  Laterality: N/A;  . ESOPHAGOGASTRODUODENOSCOPY (EGD) WITH PROPOFOL N/A 10/20/2015   Procedure: ESOPHAGOGASTRODUODENOSCOPY (EGD) WITH PROPOFOL;  Surgeon: Christena Deem, MD;  Location: [facility] ENDOSCOPY;  Service: Endoscopy;  Laterality: N/A;  . ESOPHAGOGASTRODUODENOSCOPY (EGD) WITH PROPOFOL N/A 02/23/2016   Procedure: ESOPHAGOGASTRODUODENOSCOPY (EGD) WITH PROPOFOL;  Surgeon: Christena Deem, MD;  Location: [facility] ENDOSCOPY;  Service: Endoscopy;  Laterality: N/A;  . ESOPHAGOGASTRODUODENOSCOPY (EGD) WITH PROPOFOL N/A 06/30/2016   Procedure: ESOPHAGOGASTRODUODENOSCOPY (EGD) WITH PROPOFOL;  Surgeon: Christena Deem, MD;  Location: [facility] ENDOSCOPY;  Service: Endoscopy;  Laterality: N/A;  . ESOPHAGOGASTRODUODENOSCOPY (EGD) WITH PROPOFOL N/A 01/05/2017   Procedure: ESOPHAGOGASTRODUODENOSCOPY (EGD) WITH PROPOFOL;  Surgeon: Christena Deem, MD;  Location: [facility] ENDOSCOPY;  Service: Endoscopy;  Laterality: N/A;  . KYPHOPLASTY N/A 02/10/2015   Procedure: [id number];  Surgeon: Kennedy Bucker, MD;  Location: [facility] ORS;  Service: Orthopedics;  Laterality: N/A;  . LAPAROTOMY N/A 03/11/2015   Procedure: REDUCTION OF GASTRIC VOLVULUS, SPLEENECTOMY, GASTRIC TUBE PLACEMENT;  Surgeon: Earline Mayotte, MD;  Location: [facility] ORS;  Service: General;  Laterality: N/A;  . MOHS SURGERY    . PERIPHERAL VASCULAR CATHETERIZATION N/A 06/12/2015   Procedure: IVC Filter Insertion;  Surgeon: Annice Needy, MD;  Location: [facility] INVASIVE CV LAB;  Service: Cardiovascular;  Laterality: N/A;  . PERIPHERAL VASCULAR  CATHETERIZATION N/A 08/23/2016   Procedure: IVC Filter Removal;  Surgeon: Renford Dills, MD;  Location: [facility] INVASIVE CV LAB;  Service: Cardiovascular;  Laterality: N/A;  . STOMACH SURGERY     for volvolus    Home Medications:  Allergies as of 12/25/2017      Reactions   Tramadol Other (See Comments), Rash   Rash   Amoxicillin Other (See Comments)   Reaction:  Stomach cramps    Cefuroxime Itching   Contrast Media [iodinated Diagnostic Agents] Hives   Metrizamide Hives   Morphine And Related    Reaction: face flushed    Propoxyphene    Stomach cramp   Cephalexin Rash   Sulfa Antibiotics Rash   REACTION: Rash   Sulfonamide Derivatives Rash   Tramadol Hcl Rash   Venlafaxine Rash      Medication List        Accurate as of 12/25/17  1:44 PM. Always use your most recent med list.          ADCIRCA 20 MG tablet Generic drug:  tadalafil (PAH) Take 40 mg by mouth daily.   Calcium Carbonate-Vitamin D 600-400 MG-UNIT tablet Take 1 tablet by mouth daily.   cyanocobalamin 1000 MCG/ML injection Commonly known as:  (VITAMIN B-12) Inject 1 mL (1,000 mcg total) into the muscle every 30 (thirty) days. Pt uses on the 1st of every month.  furosemide 20 MG tablet Commonly known as:  LASIX 1 tablet daily as needed for fluid retention   hydroxychloroquine 200 MG tablet Commonly known as:  PLAQUENIL Take 200 mg by mouth daily.   Lactulose 20 GM/30ML Soln 30 ml every 4 hours until constipation is relieved   letrozole 2.5 MG tablet Commonly known as:  FEMARA Take 1 tablet (2.5 mg total) by mouth daily.   levothyroxine 150 MCG tablet Commonly known as:  SYNTHROID, LEVOTHROID Take 1 tablet (150 mcg total) by mouth daily.   linaclotide 145 MCG Caps capsule Commonly known as:  LINZESS Take 1 capsule (145 mcg total) by mouth daily before breakfast.   lisinopril 5 MG tablet Commonly known as:  PRINIVIL,ZESTRIL TAKE 1 TABLET (5 MG TOTAL) BY MOUTH DAILY.   metoCLOPramide 10 MG  tablet Commonly known as:  REGLAN Take 1 tablet (10 mg total) by mouth 4 (four) times daily -  before meals and at bedtime.   metoprolol succinate 25 MG 24 hr tablet Commonly known as:  TOPROL-XL Take 12.5 mg by mouth daily.   mycophenolate 500 MG tablet Commonly known as:  CELLCEPT Take 1,000 mg by mouth 2 (two) times daily.   nystatin cream Commonly known as:  MYCOSTATIN Apply 1 application topically 2 (two) times daily.   ondansetron 4 MG disintegrating tablet Commonly known as:  ZOFRAN ODT Take 1 tablet (4 mg total) by mouth every 8 (eight) hours as needed for nausea or vomiting.   oxyCODONE-acetaminophen 5-325 MG tablet Commonly known as:  PERCOCET/ROXICET Take 1 tablet by mouth every 8 (eight) hours as needed for severe pain.   OXYGEN Inhale 2 mLs into the lungs at bedtime.   pantoprazole 40 MG tablet Commonly known as:  PROTONIX Take 40 mg by mouth 2 (two) times daily.   potassium chloride 20 MEQ/15ML (10%) Soln Take 15 mLs (20 mEq total) by mouth daily. On days taking furosemide   predniSONE 5 MG tablet Commonly known as:  DELTASONE Take 5 mg by mouth daily.   Probiotic 250 MG Caps Take 1 tablet by mouth daily.   promethazine 12.5 MG tablet Commonly known as:  PHENERGAN Take 1 tablet (12.5 mg total) by mouth every 8 (eight) hours as needed for nausea or vomiting.   REFRESH OP Place 2 drops into both eyes daily as needed (dry eyes).   sterile water for irrigation 237 mL Give 2.5 mL/hr by tube continuous.   sucralfate 1 GM/10ML suspension Commonly known as:  CARAFATE Take 1 g by mouth 4 (four) times daily. Before meals and nightly   SYRINGE 3CC/25GX1" 25G X 1" 3 ML Misc Use for b12 injections   vitamin C 1000 MG tablet Take 1,000 mg by mouth daily.       Allergies:  Allergies  Allergen Reactions  . Tramadol Other (See Comments) and Rash    Rash  . Amoxicillin Other (See Comments)    Reaction:  Stomach cramps   . Cefuroxime Itching  .  Contrast Media [Iodinated Diagnostic Agents] Hives  . Metrizamide Hives  . Morphine And Related     Reaction: face flushed    . Propoxyphene     Stomach cramp  . Cephalexin Rash  . Sulfa Antibiotics Rash    REACTION: Rash  . Sulfonamide Derivatives Rash  . Tramadol Hcl Rash  . Venlafaxine Rash    Family History: Family History  Problem Relation Age of Onset  . Colon cancer Mother   . Alcohol abuse Sister   .  Breast cancer Sister 63  . Cancer Brother   . Lung cancer Son     Social History:  reports that she quit smoking about 26 years ago. She has never used smokeless tobacco. She reports that she does not drink alcohol or use drugs.  ROS: UROLOGY Frequent Urination?: No Hard to postpone urination?: No Burning/pain with urination?: No Get up at night to urinate?: No Leakage of urine?: No Urine stream starts and stops?: Yes Trouble starting stream?: No Do you have to strain to urinate?: No Blood in urine?: No Urinary tract infection?: No Sexually transmitted disease?: No Injury to kidneys or bladder?: No Painful intercourse?: No Weak stream?: No Currently pregnant?: No Vaginal bleeding?: No Last menstrual period?: N  Gastrointestinal Nausea?: No Vomiting?: No Indigestion/heartburn?: No Diarrhea?: No Constipation?: No  Constitutional Fever: No Night sweats?: No Weight loss?: Yes Fatigue?: No  Skin Skin rash/lesions?: No Itching?: No  Eyes Blurred vision?: No Double vision?: No  Ears/Nose/Throat Sore throat?: No Sinus problems?: No  Hematologic/Lymphatic Swollen glands?: No Easy bruising?: Yes  Cardiovascular Leg swelling?: Yes Chest pain?: No  Respiratory Cough?: No Shortness of breath?: Yes  Endocrine Excessive thirst?: No  Musculoskeletal Back pain?: Yes Joint pain?: No  Neurological Headaches?: No Dizziness?: No  Psychologic Depression?: No Anxiety?: No  Physical Exam: BP 128/78   Pulse (!) 105   Resp 16   Ht 5\' 1"   (1.549 m)   Wt 125 lb 9.6 oz (57 kg)   SpO2 98%   BMI 23.73 kg/m   Constitutional:  Alert and oriented, No acute distress.  Laboratory Data: Lab Results  Component Value Date   WBC 5.8 12/22/2017   HGB 9.9 (A) 12/22/2017   HCT 32 (A) 12/22/2017   MCV 100.2 (H) 12/19/2017   PLT 412 (A) 12/22/2017    Lab Results  Component Value Date   CREATININE 1.3 (A) 12/22/2017    No results found for: PSA  No results found for: TESTOSTERONE  Lab Results  Component Value Date   HGBA1C 6.2 10/18/2017    Urinalysis    Component Value Date/Time   COLORURINE YELLOW (A) 11/28/2017 2152   APPEARANCEUR CLOUDY (A) 11/28/2017 2152   APPEARANCEUR Clear 11/20/2017 1457   LABSPEC 1.014 11/28/2017 2152   LABSPEC 1.019 02/23/2013 2135   PHURINE 5.0 11/28/2017 2152   GLUCOSEU NEGATIVE 11/28/2017 2152   GLUCOSEU Negative 02/23/2013 2135   GLUCOSEU NEGATIVE 12/24/2012 1454   HGBUR NEGATIVE 11/28/2017 2152   HGBUR trace-intact 03/10/2009 1351   BILIRUBINUR NEGATIVE 11/28/2017 2152   BILIRUBINUR Negative 11/20/2017 1457   BILIRUBINUR Negative 02/23/2013 2135   KETONESUR NEGATIVE 11/28/2017 2152   PROTEINUR NEGATIVE 11/28/2017 2152   UROBILINOGEN 0.2 12/17/2014 1419   UROBILINOGEN 0.2 12/24/2012 1454   NITRITE NEGATIVE 11/28/2017 2152   LEUKOCYTESUR LARGE (A) 11/28/2017 2152   LEUKOCYTESUR Negative 11/20/2017 1457   LEUKOCYTESUR Negative 02/23/2013 2135    Pertinent Imaging:   Assessment & Plan: Assurance given.  I will see her as needed  There are no diagnoses linked to this encounter.  No follow-ups on file.  Martina Sinner, MD  [facility] [address] [place], [place] [address] [phone]

## 2017-12-25 NOTE — Telephone Encounter (Signed)
Pt returned call and lab results given to her as per request. She states that [facility] is coming back tomorrow. She had a question regarding whether other labs needs to be drawn tomorrow when [facility] comes out. She would like a call back please.

## 2017-12-25 NOTE — Telephone Encounter (Signed)
Labs reviewed from [place].  Kidney function has improved.  Anemia is unchanged (hgb is 9.9) and iron , B12 folate studies have  been ordered by Dr Janese Banks but not done .  Is [facility] [name] coming back out?

## 2017-12-25 NOTE — Telephone Encounter (Signed)
LMTCB. [facility] may speak with pt.  

## 2017-12-25 NOTE — Telephone Encounter (Signed)
fyi

## 2017-12-26 DIAGNOSIS — N390 Urinary tract infection, site not specified: Secondary | ICD-10-CM | POA: Diagnosis not present

## 2017-12-26 DIAGNOSIS — K5732 Diverticulitis of large intestine without perforation or abscess without bleeding: Secondary | ICD-10-CM | POA: Diagnosis not present

## 2017-12-28 ENCOUNTER — Ambulatory Visit: Admit: 2017-12-28 | Discharge: 2017-12-28 | Payer: MEDICARE

## 2017-12-28 ENCOUNTER — Ambulatory Visit: Admit: 2017-12-28 | Discharge: 2017-12-28 | Payer: MEDICARE | Attending: Nephrology | Primary: Nephrology

## 2017-12-28 DIAGNOSIS — R079 Chest pain, unspecified: Principal | ICD-10-CM

## 2017-12-28 DIAGNOSIS — M3214 Glomerular disease in systemic lupus erythematosus: Secondary | ICD-10-CM

## 2017-12-28 DIAGNOSIS — D508 Other iron deficiency anemias: Secondary | ICD-10-CM

## 2017-12-28 DIAGNOSIS — K449 Diaphragmatic hernia without obstruction or gangrene: Secondary | ICD-10-CM | POA: Diagnosis not present

## 2018-01-02 ENCOUNTER — Ambulatory Visit: Payer: Self-pay | Admitting: *Deleted

## 2018-01-02 DIAGNOSIS — K5732 Diverticulitis of large intestine without perforation or abscess without bleeding: Secondary | ICD-10-CM | POA: Diagnosis not present

## 2018-01-02 DIAGNOSIS — N390 Urinary tract infection, site not specified: Secondary | ICD-10-CM | POA: Diagnosis not present

## 2018-01-02 NOTE — Telephone Encounter (Signed)
Please advise 

## 2018-01-02 NOTE — Telephone Encounter (Signed)
Spoke with pt and informed her of her coumadin dose change and let her know to recheck her INR in two weeks. Pt gave a verbal understanding. Scheduled pt's lab appt and she is aware of appt date and time.

## 2018-01-02 NOTE — Telephone Encounter (Signed)
It ure would be nice if the RN for [facility] coud confirm the patient's coumadin regimen while they have them on the phone,  Since  Her INR is low at 1.6  abd her dose is nowhere to be found , Plesae call paitnet and ask her waht her current regimen is

## 2018-01-02 NOTE — Telephone Encounter (Signed)
Have her take 6 mg tonight,  Then starting tomorrow 4 mg daily .  Recheck 2 weeks

## 2018-01-02 NOTE — Telephone Encounter (Signed)
Spoke with pt and she stated that she alternates 4mg  and 3mg .

## 2018-01-02 NOTE — Telephone Encounter (Signed)
Derald Macleod, RN from [place] called stating that the pt's INR is 1.6 obtained 01/02/18 at 1325; value repeated for verification; will route to office for provider review; spoke with Carolee Rota in lab because no answer at flow coordinator's desk.   Answer Assessment - Initial Assessment Questions 1. REASON FOR CALL or QUESTION: "What is your reason for calling today?" or "How can I best help you?" or "What question do you have that I can help answer?"     Derald Macleod, RN from [place] called to report pt INR is 1.6  Answer Assessment - Initial Assessment Questions 1. REASON FOR CALL or QUESTION: "What is your reason for calling today?" or "How can I best help you?" or "What question do you have that I can help answer?"     Derald Macleod, RN from [place] called to report pt's INR is 1.6 2. CALLER: Document the source of call. (e.g., laboratory, patient).     Derald Macleod, RN from [place]  Protocols used: INFORMATION ONLY CALL-A-AH, PCP CALL - NO TRIAGE-A-AH

## 2018-01-08 DIAGNOSIS — Z79899 Other long term (current) drug therapy: Secondary | ICD-10-CM | POA: Diagnosis not present

## 2018-01-08 DIAGNOSIS — H02054 Trichiasis without entropian left upper eyelid: Secondary | ICD-10-CM | POA: Diagnosis not present

## 2018-01-08 DIAGNOSIS — M329 Systemic lupus erythematosus, unspecified: Secondary | ICD-10-CM | POA: Diagnosis not present

## 2018-01-15 ENCOUNTER — Other Ambulatory Visit: Payer: Self-pay | Admitting: Radiology

## 2018-01-15 DIAGNOSIS — Z7901 Long term (current) use of anticoagulants: Principal | ICD-10-CM

## 2018-01-15 DIAGNOSIS — Z5181 Encounter for therapeutic drug level monitoring: Secondary | ICD-10-CM

## 2018-01-16 ENCOUNTER — Other Ambulatory Visit: Payer: Medicare Other

## 2018-01-24 DIAGNOSIS — S0006XA Insect bite (nonvenomous) of scalp, initial encounter: Secondary | ICD-10-CM | POA: Diagnosis not present

## 2018-01-24 DIAGNOSIS — D0462 Carcinoma in situ of skin of left upper limb, including shoulder: Secondary | ICD-10-CM | POA: Diagnosis not present

## 2018-01-24 DIAGNOSIS — L905 Scar conditions and fibrosis of skin: Secondary | ICD-10-CM | POA: Diagnosis not present

## 2018-02-02 DIAGNOSIS — L03116 Cellulitis of left lower limb: Secondary | ICD-10-CM | POA: Diagnosis not present

## 2018-02-05 ENCOUNTER — Ambulatory Visit (INDEPENDENT_AMBULATORY_CARE_PROVIDER_SITE_OTHER): Payer: Medicare Other | Admitting: Internal Medicine

## 2018-02-05 ENCOUNTER — Encounter: Payer: Self-pay | Admitting: Internal Medicine

## 2018-02-05 VITALS — BP 128/80 | HR 99 | Temp 98.4°F | Resp 17 | Ht 61.0 in | Wt 127.6 lb

## 2018-02-05 DIAGNOSIS — Z7901 Long term (current) use of anticoagulants: Secondary | ICD-10-CM | POA: Diagnosis not present

## 2018-02-05 DIAGNOSIS — L02415 Cutaneous abscess of right lower limb: Secondary | ICD-10-CM | POA: Diagnosis not present

## 2018-02-05 DIAGNOSIS — Z5181 Encounter for therapeutic drug level monitoring: Secondary | ICD-10-CM

## 2018-02-05 DIAGNOSIS — N183 Chronic kidney disease, stage 3 unspecified: Secondary | ICD-10-CM

## 2018-02-05 DIAGNOSIS — L03115 Cellulitis of right lower limb: Secondary | ICD-10-CM

## 2018-02-05 DIAGNOSIS — Z794 Long term (current) use of insulin: Secondary | ICD-10-CM

## 2018-02-05 DIAGNOSIS — M47817 Spondylosis without myelopathy or radiculopathy, lumbosacral region: Secondary | ICD-10-CM | POA: Diagnosis not present

## 2018-02-05 DIAGNOSIS — E559 Vitamin D deficiency, unspecified: Secondary | ICD-10-CM

## 2018-02-05 DIAGNOSIS — D631 Anemia in chronic kidney disease: Secondary | ICD-10-CM | POA: Diagnosis not present

## 2018-02-05 DIAGNOSIS — E119 Type 2 diabetes mellitus without complications: Secondary | ICD-10-CM | POA: Diagnosis not present

## 2018-02-05 LAB — CBC WITH DIFFERENTIAL/PLATELET
BASOS ABS: 0.1 10*3/uL (ref 0.0–0.1)
Basophils Relative: 1.8 % (ref 0.0–3.0)
EOS PCT: 3.4 % (ref 0.0–5.0)
Eosinophils Absolute: 0.2 10*3/uL (ref 0.0–0.7)
HEMATOCRIT: 28 % — AB (ref 36.0–46.0)
HEMOGLOBIN: 9.1 g/dL — AB (ref 12.0–15.0)
LYMPHS PCT: 22.3 % (ref 12.0–46.0)
Lymphs Abs: 1.3 10*3/uL (ref 0.7–4.0)
MCHC: 32.7 g/dL (ref 30.0–36.0)
MCV: 97.7 fl (ref 78.0–100.0)
MONOS PCT: 12.9 % — AB (ref 3.0–12.0)
Monocytes Absolute: 0.8 10*3/uL (ref 0.1–1.0)
Neutro Abs: 3.6 10*3/uL (ref 1.4–7.7)
Neutrophils Relative %: 59.6 % (ref 43.0–77.0)
Platelets: 411 10*3/uL — ABNORMAL HIGH (ref 150.0–400.0)
RBC: 2.86 Mil/uL — AB (ref 3.87–5.11)
RDW: 15.8 % — ABNORMAL HIGH (ref 11.5–15.5)
WBC: 6 10*3/uL (ref 4.0–10.5)

## 2018-02-05 LAB — HEMOGLOBIN A1C: Hgb A1c MFr Bld: 6.4 % (ref 4.6–6.5)

## 2018-02-05 LAB — PROTIME-INR
INR: 3.2 ratio — AB (ref 0.8–1.0)
PROTHROMBIN TIME: 36.8 s — AB (ref 9.6–13.1)

## 2018-02-05 LAB — VITAMIN D 25 HYDROXY (VIT D DEFICIENCY, FRACTURES): VITD: 23.8 ng/mL — ABNORMAL LOW (ref 30.00–100.00)

## 2018-02-05 MED ORDER — OXYCODONE-ACETAMINOPHEN 5-325 MG PO TABS: 1.0000 | ORAL_TABLET | Freq: Three times a day (TID) | ORAL | 0 refills | Status: DC | PRN

## 2018-02-05 NOTE — Patient Instructions (Signed)
I am checking yoru iron B12 and vitamin d stores today  If your iron is low you will ned an iron infusion and we will arrange that    Elevate your legs whenever sitting  Do not shower until your wound has healed  .  (wash in the sink)

## 2018-02-05 NOTE — Progress Notes (Signed)
Subjective:  Patient ID: Diana [name], female    DOB: May 31, 1935  Age: 83 y.o. MRN: [id number]  CC: The primary encounter diagnosis was Vitamin D deficiency. Diagnoses of Type 2 diabetes mellitus without complication, with long-term current use of insulin (HCC), Monitoring for anticoagulant use, Anemia due to stage 3 chronic kidney disease (HCC), Cellulitis and abscess of right lower extremity, Anticoagulation goal of INR 2 to 3, and Facet arthritis of lumbosacral region were also pertinent to this visit.  HPI Diana [name] presents for follow up  Treated for cellutlitis of lower leg by Urgent Care.  The infection Started at site of recent skin biopsy to RLE that was done by [facility]. Infection started after patient tried to pick off the  scab last week. Two days later developed serous drainage , then purulent  drainage the following day .   treated Friday with doxycycline and wound culture was sent but not available at time of eval. Sucralfate was suspended .  Wound is very painful,  Lower leg gets red with prolonged sitting . Not able to wear compression stockings currently.      2) chronic anemia due to recurrent GI bleeds:  hgb was 9.1  At urgent care  (9.9 on April 5).  Has not had  IV iron in at least 6 months ,  Last one was given at the cancer center   3) Lupus nephritis: quiescent per recent eval [facility] nephrology:  cellcept placquenil nd low dose prednisone   4) chronic back pain :due to facet arthritis and compression fracture (remote)  Needing refill on oxycdone for May June and July   5) Chronic anticoagulation: secondary to recurrent DVT.  Managed with warfaitr,  INR due   Lab Results  Component Value Date   HGBA1C 6.4 02/05/2018      Outpatient Medications Prior to Visit  Medication Sig Dispense Refill  . Ascorbic Acid (VITAMIN C) 1000 MG tablet Take 1,000 mg by mouth daily.    . Calcium Carbonate-Vitamin D 600-400 MG-UNIT tablet Take 1 tablet by mouth daily.    . [facility]  ALLERGY RELIEF 25 MG tablet TAKE 2 TABLETS (50 MG TOTAL) BY MOUTH ONCE FOR 1 DOSE 1 HOUR PRIOR TO CTU.  0  . cyanocobalamin (,VITAMIN B-12,) 1000 MCG/ML injection Inject 1 mL (1,000 mcg total) into the muscle every 30 (thirty) days. Pt uses on the 1st of every month. 10 mL 1  . doxycycline (VIBRAMYCIN) 100 MG capsule     . furosemide (LASIX) 20 MG tablet 1 tablet daily as needed for fluid retention 30 tablet 3  . hydroxychloroquine (PLAQUENIL) 200 MG tablet Take 200 mg by mouth daily.    . Lactulose 20 GM/30ML SOLN 30 ml every 4 hours until constipation is relieved 236 mL 3  . letrozole (FEMARA) 2.5 MG tablet Take 1 tablet (2.5 mg total) by mouth daily. 30 tablet 9  . levothyroxine (SYNTHROID, LEVOTHROID) 150 MCG tablet Take 1 tablet (150 mcg total) by mouth daily. 90 tablet 3  . linaclotide (LINZESS) 145 MCG CAPS capsule Take 1 capsule (145 mcg total) by mouth daily before breakfast. 30 capsule 2  . lisinopril (PRINIVIL,ZESTRIL) 5 MG tablet TAKE 1 TABLET (5 MG TOTAL) BY MOUTH DAILY. 90 tablet 3  . metoCLOPramide (REGLAN) 10 MG tablet Take 1 tablet (10 mg total) by mouth 4 (four) times daily -  before meals and at bedtime. 360 tablet 1  . metoprolol succinate (TOPROL-XL) 25 MG 24 hr tablet Take 12.5  mg by mouth daily.     . mycophenolate (CELLCEPT) 500 MG tablet Take 1,000 mg by mouth 2 (two) times daily.    Marland Kitchen nystatin cream (MYCOSTATIN) Apply 1 application topically 2 (two) times daily. 30 g 0  . ondansetron (ZOFRAN ODT) 4 MG disintegrating tablet Take 1 tablet (4 mg total) by mouth every 8 (eight) hours as needed for nausea or vomiting. 20 tablet 0  . OXYGEN Inhale 2 mLs into the lungs at bedtime.    . pantoprazole (PROTONIX) 40 MG tablet Take 40 mg by mouth 2 (two) times daily.    . Polyvinyl Alcohol-Povidone (REFRESH OP) Place 2 drops into both eyes daily as needed (dry eyes).     . potassium chloride 20 MEQ/15ML (10%) SOLN Take 15 mLs (20 mEq total) by mouth daily. On days taking furosemide  240 mL 3  . predniSONE (DELTASONE) 5 MG tablet Take 5 mg by mouth daily.     . promethazine (PHENERGAN) 12.5 MG tablet Take 1 tablet (12.5 mg total) by mouth every 8 (eight) hours as needed for nausea or vomiting. 20 tablet 5  . ranitidine (ZANTAC) 150 MG tablet TAKE 1 TABLET (150 MG TOTAL) BY MOUTH ONCE FOR 1 DOSE. TAKE 1 TABLET 1 HOUR PRIOR TO CTU  0  . Saccharomyces boulardii (PROBIOTIC) 250 MG CAPS Take 1 tablet by mouth daily. 14 capsule   . sterile water for irrigation 237 mL Give 2.5 mL/hr by tube continuous.    . sucralfate (CARAFATE) 1 GM/10ML suspension Take 1 g by mouth 4 (four) times daily. Before meals and nightly    . Syringe/Needle, Disp, (SYRINGE 3CC/25GX1") 25G X 1" 3 ML MISC Use for b12 injections 50 each 0  . Tadalafil, PAH, (ADCIRCA) 20 MG TABS Take 40 mg by mouth daily.    Marland Kitchen oxyCODONE-acetaminophen (PERCOCET/ROXICET) 5-325 MG tablet Take 1 tablet by mouth every 8 (eight) hours as needed for severe pain. 65 tablet 0   No facility-administered medications prior to visit.     Review of Systems;  Patient denies headache, fevers, malaise, unintentional weight loss, skin rash, eye pain, sinus congestion and sinus pain, sore throat, dysphagia,  hemoptysis , cough, dyspnea, wheezing, chest pain, palpitations, orthopnea, edema, abdominal pain, nausea, melena, diarrhea, constipation, flank pain, dysuria, hematuria, urinary  Frequency, nocturia, numbness, tingling, seizures,  Focal weakness, Loss of consciousness,  Tremor, insomnia, depression, anxiety, and suicidal ideation.      Objective:  BP 128/80 (BP Location: Right Arm, Patient Position: Sitting, Cuff Size: Normal)   Pulse 99   Temp 98.4 F (36.9 C) (Oral)   Resp 17   Ht 5\' 1"  (1.549 m)   Wt 127 lb 9.6 oz (57.9 kg)   SpO2 92%   BMI 24.11 kg/m   BP Readings from Last 3 Encounters:  02/05/18 128/80  12/25/17 128/78  12/19/17 105/67    Wt Readings from Last 3 Encounters:  02/05/18 127 lb 9.6 oz (57.9 kg)    12/25/17 125 lb 9.6 oz (57 kg)  12/19/17 130 lb (59 kg)    General appearance: alert, cooperative and appears stated age Ears: normal TM's and external ear canals both ears Throat: lips, mucosa, and tongue normal; teeth and gums normal Neck: no adenopathy, no carotid bruit, supple, symmetrical, trachea midline and thyroid not enlarged, symmetric, no tenderness/mass/nodules Back: symmetric, no curvature. ROM normal. No CVA tenderness. Lungs: clear to auscultation bilaterally Heart: regular rate and rhythm, S1, S2 normal, no murmur, click, rub or gallop Abdomen: soft, non-tender;  bowel sounds normal; no masses,  no organomegaly Pulses: 2+ and symmetric Skin: RLE with dime sized wound without tunneling or exudate.  Surrounding skin erytematus without warmth  hyperesthesia noted  Lymph nodes: Cervical, supraclavicular, and axillary nodes normal.  Lab Results  Component Value Date   HGBA1C 6.4 02/05/2018   HGBA1C 6.2 10/18/2017   HGBA1C 6.3 05/17/2017    Lab Results  Component Value Date   CREATININE 1.3 (A) 12/22/2017   CREATININE 1.74 (H) 12/19/2017   CREATININE 0.97 12/02/2017    Lab Results  Component Value Date   WBC 6.0 02/05/2018   HGB 9.1 (L) 02/05/2018   HCT 28.0 (L) 02/05/2018   PLT 411.0 (H) 02/05/2018   GLUCOSE 102 (H) 12/19/2017   CHOL 164 10/11/2013   TRIG 152.0 (H) 10/11/2013   HDL 69.70 10/11/2013   LDLDIRECT 50.0 05/09/2016   LDLCALC 64 10/11/2013   ALT 9 (L) 12/19/2017   AST 22 12/19/2017   NA 146 12/22/2017   K 4.7 12/22/2017   CL 107 12/19/2017   CREATININE 1.3 (A) 12/22/2017   BUN 24 (A) 12/22/2017   CO2 23 12/19/2017   TSH 0.058 (L) 11/29/2017   INR 3.2 (H) 02/05/2018   HGBA1C 6.4 02/05/2018   MICROALBUR 8.7 (H) 09/14/2016    Ct Abdomen Pelvis Wo Contrast  Result Date: 12/22/2017 CLINICAL DATA:  Microscopic hematuria and pelvic pain. EXAM: CT ABDOMEN AND PELVIS WITHOUT CONTRAST TECHNIQUE: Multidetector CT imaging of the abdomen and pelvis  was performed following the standard protocol without IV contrast. COMPARISON:  CT scan 12/01/2017 FINDINGS: Lower chest: Bibasilar scarring and atelectasis. No worrisome pulmonary lesions. The heart is enlarged but stable. Very large hiatal hernia. Hepatobiliary: No focal hepatic lesions or intrahepatic biliary dilatation. The gallbladder is mildly distended. No obvious gallstones and no findings for acute cholecystitis. No common bile duct dilatation. Pancreas: Marked atrophy.  No mass or inflammation. Spleen: Surgically absent. Adrenals/Urinary Tract: The adrenal glands and kidneys are unremarkable. Stable upper pole left renal cyst. No renal, ureteral or bladder calculi to account for the patient's microhematuria. No renal or bladder mass lesions without contrast. Stomach/Bowel: Most of the stomach is up in the chest. The duodenum and small bowel are unremarkable. No acute inflammatory changes, mass lesions or obstructive findings. Diffuse and significant colonic diverticulosis without definite findings for acute diverticulitis. The diverticulitis seen on the prior study has resolved. Vascular/Lymphatic: Stable tortuosity and calcification of the abdominal aorta. No mesenteric or retroperitoneal mass or lymphadenopathy. Reproductive: The uterus and ovaries are unremarkable. Other: No pelvic mass or adenopathy. No free pelvic fluid collections. No inguinal mass or adenopathy. No abdominal wall hernia or subcutaneous lesions. Musculoskeletal: Stable vertebral augmentation changes at L1 and L2. No acute fractures. Scoliosis and degenerative lumbar spondylosis are again noted. IMPRESSION: 1. No CT findings to account for the patient's microhematuria. No renal, ureteral or bladder calculi or mass. 2. Stable upper pole left renal cyst and small lower pole renal cyst. 3. No acute abdominal/pelvic findings, mass lesions or adenopathy. 4. Stable severe and diffuse colonic diverticulosis without findings for acute  diverticulitis. Electronically Signed   By: Rudie Meyer M.D.   On: 12/22/2017 10:26    Assessment & Plan:   Problem List Items Addressed This Visit    Type 2 diabetes mellitus without complications (HCC)   Relevant Orders   Hemoglobin A1c (Completed)   Facet arthritis of lumbosacral region    Her pain is persistent despite ESI and nerve blocks and has been complicated  by vertebral fracture,  It is controlled with a maximum dose of 2  Percocet  daily.  She has not had any ER visits  And has not requested any early refills.  She has taken an extra dose for the last 3 days due to the pain from her wound.  Her Refill history was confirmed via [place] Controlled Substance database by me today during her visit and there have been no prescriptions of controlled substances filled from any providers other than me.  Last refill was May 7 . Refills for  Three moths,  #65  Tablets each  Given       Relevant Medications   oxyCODONE-acetaminophen (PERCOCET/ROXICET) 5-325 MG tablet   Cellulitis and abscess of right lower extremity    Wound does not look infected  Redness appears to be from BI.  Reassurance provided to continue current  Antibiotic regimen, probiotic advised.       Anticoagulation goal of INR 2 to 3    INR is slightly high due to concurrent use of abx.  Will suspend dose for one day.   Lab Results  Component Value Date   INR 3.2 (H) 02/05/2018   INR 1.2 12/07/2017   INR 3.11 12/02/2017         Absolute anemia    Historically improved with iron infusions . hgb drop confirmed ,  Needs iron studies run that were ordered in 02-03-23   Lab Results  Component Value Date   WBC 6.0 02/05/2018   HGB 9.1 (L) 02/05/2018   HCT 28.0 (L) 02/05/2018   MCV 97.7 02/05/2018   PLT 411.0 (H) 02/05/2018          Other Visit Diagnoses    Vitamin D deficiency    -  Primary   Relevant Orders   Vitamin D (25 hydroxy) (Completed)   Monitoring for anticoagulant use         A total of 25 minutes  of face to face time was spent with patient more than half of which was spent in counselling about the above mentioned conditions  and coordination of care    I am having Tiffine B. Miguez maintain her hydroxychloroquine, tadalafil (PAH), predniSONE, metoprolol succinate, mycophenolate, Polyvinyl Alcohol-Povidone (REFRESH OP), OXYGEN, sterile water for irrigation 237 mL, Calcium Carbonate-Vitamin D, pantoprazole, cyanocobalamin, SYRINGE 3CC/25GX1", vitamin C, sucralfate, levothyroxine, lisinopril, promethazine, nystatin cream, linaclotide, Lactulose, ondansetron, Probiotic, furosemide, potassium chloride, metoCLOPramide, letrozole, doxycycline, [facility]S ALLERGY RELIEF, ranitidine, and oxyCODONE-acetaminophen.  Meds ordered this encounter  Medications  . DISCONTD: oxyCODONE-acetaminophen (PERCOCET/ROXICET) 5-325 MG tablet    Sig: Take 1 tablet by mouth every 8 (eight) hours as needed for severe pain.    Dispense:  65 tablet    Refill:  0    May refill on or after Feb 08 2018  . DISCONTD: oxyCODONE-acetaminophen (PERCOCET/ROXICET) 5-325 MG tablet    Sig: Take 1 tablet by mouth every 8 (eight) hours as needed for severe pain.    Dispense:  65 tablet    Refill:  0    May refill on or after March 10 2018  . oxyCODONE-acetaminophen (PERCOCET/ROXICET) 5-325 MG tablet    Sig: Take 1 tablet by mouth every 8 (eight) hours as needed for severe pain.    Dispense:  65 tablet    Refill:  0    May refill on or after April 09 2018    Medications Discontinued During This Encounter  Medication Reason  . oxyCODONE-acetaminophen (PERCOCET/ROXICET) 5-325 MG tablet Reorder  .  oxyCODONE-acetaminophen (PERCOCET/ROXICET) 5-325 MG tablet Reorder  . oxyCODONE-acetaminophen (PERCOCET/ROXICET) 5-325 MG tablet Reorder    Follow-up: Return in about 3 months (around 05/08/2018).   Sherlene Shams, MD

## 2018-02-06 ENCOUNTER — Telehealth: Payer: Self-pay | Admitting: Internal Medicine

## 2018-02-06 ENCOUNTER — Other Ambulatory Visit (INDEPENDENT_AMBULATORY_CARE_PROVIDER_SITE_OTHER): Payer: Medicare Other

## 2018-02-06 DIAGNOSIS — D539 Nutritional anemia, unspecified: Secondary | ICD-10-CM

## 2018-02-06 DIAGNOSIS — D500 Iron deficiency anemia secondary to blood loss (chronic): Secondary | ICD-10-CM

## 2018-02-06 LAB — FOLATE: Folate: 21.9 ng/mL (ref >5.9)

## 2018-02-06 LAB — VITAMIN B12: VITAMIN B 12: 326 pg/mL (ref 211–911)

## 2018-02-06 LAB — FERRITIN: Ferritin: 59.1 ng/mL (ref 10.0–291.0)

## 2018-02-06 NOTE — Assessment & Plan Note (Addendum)
Her pain is persistent despite ESI and nerve blocks and has been complicated by vertebral fracture,  It is controlled with a maximum dose of 2  Percocet  daily.  She has not had any ER visits  And has not requested any early refills.  She has taken an extra dose for the last 3 days due to the pain from her wound.  Her Refill history was confirmed via [place] Controlled Substance database by me today during her visit and there have been no prescriptions of controlled substances filled from any providers other than me.  Last refill was May 7 . Refills for  Three moths,  #65  Tablets each  Given

## 2018-02-06 NOTE — Assessment & Plan Note (Addendum)
Historically improved with iron infusions . hgb drop confirmed ,  Needs iron studies run that were ordered in 07-Feb-2023   Lab Results  Component Value Date   WBC 6.0 02/05/2018   HGB 9.1 (L) 02/05/2018   HCT 28.0 (L) 02/05/2018   MCV 97.7 02/05/2018   PLT 411.0 (H) 02/05/2018

## 2018-02-06 NOTE — Assessment & Plan Note (Signed)
Wound does not look infected  Redness appears to be from BI.  Reassurance provided to continue current  Antibiotic regimen, probiotic advised.

## 2018-02-06 NOTE — Addendum Note (Signed)
Addended by: Leeanne Rio on: 02/06/2018 04:14 PM   Modules accepted: Orders

## 2018-02-06 NOTE — Telephone Encounter (Signed)
Patient will have to return for iron studies ,  Please tell patient and have lab draw the ones that are still ordered regardless of whose name is on them

## 2018-02-06 NOTE — Assessment & Plan Note (Signed)
INR is slightly high due to concurrent use of abx.  Will suspend dose for one day.   Lab Results  Component Value Date   INR 3.2 (H) 02/05/2018   INR 1.2 12/07/2017   INR 3.11 12/02/2017

## 2018-02-07 ENCOUNTER — Other Ambulatory Visit (INDEPENDENT_AMBULATORY_CARE_PROVIDER_SITE_OTHER): Payer: Medicare Other

## 2018-02-07 ENCOUNTER — Telehealth: Payer: Self-pay

## 2018-02-07 DIAGNOSIS — D0439 Carcinoma in situ of skin of other parts of face: Secondary | ICD-10-CM | POA: Diagnosis not present

## 2018-02-07 DIAGNOSIS — N183 Chronic kidney disease, stage 3 unspecified: Secondary | ICD-10-CM

## 2018-02-07 DIAGNOSIS — D631 Anemia in chronic kidney disease: Secondary | ICD-10-CM | POA: Diagnosis not present

## 2018-02-07 LAB — IRON: Iron: 64 ug/dL (ref 42–145)

## 2018-02-07 LAB — IBC PANEL
IRON: 64 ug/dL (ref 42–145)
Saturation Ratios: 27.5 % (ref 20.0–50.0)
Transferrin: 166 mg/dL — ABNORMAL LOW (ref 212.0–360.0)

## 2018-02-07 NOTE — Telephone Encounter (Signed)
LMTCB. Please transfer pt to our office.  

## 2018-02-07 NOTE — Telephone Encounter (Signed)
Please see previous message

## 2018-02-07 NOTE — Telephone Encounter (Signed)
Copied from [place]. Topic: General - Other >> Feb 07, 2018  1:15 PM Yvette Rack wrote: Reason for CRM: Pt requests call back at [phone]

## 2018-02-07 NOTE — Telephone Encounter (Signed)
Spoke with pt and scheduled her a lab appt. Pt is aware of appt date and time.

## 2018-02-08 ENCOUNTER — Other Ambulatory Visit (INDEPENDENT_AMBULATORY_CARE_PROVIDER_SITE_OTHER): Payer: Medicare Other

## 2018-02-08 ENCOUNTER — Telehealth: Payer: Self-pay | Admitting: *Deleted

## 2018-02-08 ENCOUNTER — Other Ambulatory Visit: Payer: Medicare Other

## 2018-02-08 DIAGNOSIS — D649 Anemia, unspecified: Secondary | ICD-10-CM

## 2018-02-08 DIAGNOSIS — D500 Iron deficiency anemia secondary to blood loss (chronic): Secondary | ICD-10-CM | POA: Diagnosis not present

## 2018-02-08 NOTE — Telephone Encounter (Signed)
She needs the EPO but not the Iron panel I am adding a reticulocyte count

## 2018-02-08 NOTE — Telephone Encounter (Signed)
Pt has an appt to return for more labs today. Test were added on & resulted yesterday. Please let me know if it is necessary for her to still come in today. If not, I will need to call pt & cancel appt this morning.

## 2018-02-13 LAB — RETICULOCYTES
ABS Retic: 68420 cells/uL (ref 20000–8000)
Retic Ct Pct: 2.2 %

## 2018-02-13 LAB — ERYTHROPOIETIN: Erythropoietin: 48.4 m[IU]/mL — ABNORMAL HIGH (ref 2.6–18.5)

## 2018-02-14 ENCOUNTER — Ambulatory Visit: Admit: 2018-02-14 | Discharge: 2018-02-14 | Payer: MEDICARE

## 2018-02-14 DIAGNOSIS — Z780 Asymptomatic menopausal state: Principal | ICD-10-CM

## 2018-02-14 DIAGNOSIS — E2839 Other primary ovarian failure: Secondary | ICD-10-CM

## 2018-02-14 DIAGNOSIS — M8588 Other specified disorders of bone density and structure, other site: Secondary | ICD-10-CM | POA: Diagnosis not present

## 2018-02-14 DIAGNOSIS — D0472 Carcinoma in situ of skin of left lower limb, including hip: Secondary | ICD-10-CM | POA: Diagnosis not present

## 2018-02-26 ENCOUNTER — Other Ambulatory Visit: Payer: Self-pay | Admitting: Internal Medicine

## 2018-03-20 ENCOUNTER — Ambulatory Visit: Admit: 2018-03-20 | Discharge: 2018-03-21 | Payer: MEDICARE | Attending: Internal Medicine | Primary: Internal Medicine

## 2018-03-20 ENCOUNTER — Other Ambulatory Visit: Payer: Self-pay | Admitting: Internal Medicine

## 2018-03-20 DIAGNOSIS — I5032 Chronic diastolic (congestive) heart failure: Principal | ICD-10-CM

## 2018-03-20 DIAGNOSIS — I2729 Other secondary pulmonary hypertension: Secondary | ICD-10-CM

## 2018-03-27 ENCOUNTER — Telehealth: Payer: Self-pay

## 2018-03-27 NOTE — Telephone Encounter (Signed)
Copied from [place] [id number]. Topic: General - Other >> Mar 27, 2018  4:21 PM Marin Olp L wrote: Reason for CRM: Patient needs to be seen for INR/PT and has transportation issues. Please advise on when Dr. Derrel Nip needs her to follow up.

## 2018-03-30 ENCOUNTER — Other Ambulatory Visit: Payer: Self-pay | Admitting: Radiology

## 2018-03-30 DIAGNOSIS — Z7901 Long term (current) use of anticoagulants: Principal | ICD-10-CM

## 2018-03-30 DIAGNOSIS — Z5181 Encounter for therapeutic drug level monitoring: Secondary | ICD-10-CM

## 2018-03-30 NOTE — Telephone Encounter (Signed)
Tried calling, no voicemail. [place] for pec to inform patient of results

## 2018-03-30 NOTE — Telephone Encounter (Signed)
Her iron stores,  Erythropoietin level and retic counts were normal.   B12 was normal as well.

## 2018-03-30 NOTE — Telephone Encounter (Signed)
Patient states she had to return for labs the day after she had labs done in may and has not heard about results. Please advise it was labs from 02/07/18

## 2018-04-02 ENCOUNTER — Other Ambulatory Visit (INDEPENDENT_AMBULATORY_CARE_PROVIDER_SITE_OTHER): Payer: Medicare Other

## 2018-04-02 DIAGNOSIS — Z7901 Long term (current) use of anticoagulants: Secondary | ICD-10-CM | POA: Diagnosis not present

## 2018-04-02 DIAGNOSIS — Z5181 Encounter for therapeutic drug level monitoring: Secondary | ICD-10-CM | POA: Diagnosis not present

## 2018-04-02 LAB — PROTIME-INR
INR: 1.9 ratio — ABNORMAL HIGH (ref 0.8–1.0)
PROTHROMBIN TIME: 22.5 s — AB (ref 9.6–13.1)

## 2018-04-02 NOTE — Telephone Encounter (Signed)
fyi

## 2018-04-02 NOTE — Telephone Encounter (Signed)
Pt was in the office today for lab draw and was given results to previous labs and was given a printout of lab results.

## 2018-04-03 ENCOUNTER — Other Ambulatory Visit: Payer: Self-pay | Admitting: Internal Medicine

## 2018-04-05 ENCOUNTER — Other Ambulatory Visit: Payer: Self-pay | Admitting: Gastroenterology

## 2018-04-05 ENCOUNTER — Ambulatory Visit
Admission: RE | Admit: 2018-04-05 | Discharge: 2018-04-05 | Disposition: A | Discharge status: Home / Self Care | Payer: Medicare Other | Source: Ambulatory Visit | Attending: Gastroenterology | Admitting: Gastroenterology

## 2018-04-05 ENCOUNTER — Telehealth: Payer: Self-pay | Admitting: Internal Medicine

## 2018-04-05 DIAGNOSIS — R1032 Left lower quadrant pain: Secondary | ICD-10-CM

## 2018-04-05 DIAGNOSIS — K449 Diaphragmatic hernia without obstruction or gangrene: Secondary | ICD-10-CM | POA: Insufficient documentation

## 2018-04-05 DIAGNOSIS — K5730 Diverticulosis of large intestine without perforation or abscess without bleeding: Secondary | ICD-10-CM | POA: Insufficient documentation

## 2018-04-05 DIAGNOSIS — I700 Atherosclerosis of aorta: Secondary | ICD-10-CM | POA: Diagnosis not present

## 2018-04-05 DIAGNOSIS — R509 Fever, unspecified: Secondary | ICD-10-CM | POA: Diagnosis not present

## 2018-04-05 DIAGNOSIS — R110 Nausea: Secondary | ICD-10-CM | POA: Diagnosis not present

## 2018-04-05 MED ORDER — ADCIRCA 20 MG TABLET
ORAL_TABLET | Freq: Every day | ORAL | 11 refills | 0.00000 days | Status: CP
Start: 2018-04-05 — End: 2019-03-11

## 2018-04-05 NOTE — Telephone Encounter (Signed)
Copied from [place] [id number]. Topic: Quick Communication - Lab Results >> Apr 05, 2018  1:03 PM Juanda Chance, CMA wrote: Left message to return call, ok for [facility] to give results and speak to patient  >> Apr 05, 2018  4:50 PM Bea Graff, NT wrote: Pt would like a call today if possible with her lab results.

## 2018-04-05 NOTE — Telephone Encounter (Signed)
Charted in result notes. 

## 2018-04-06 ENCOUNTER — Other Ambulatory Visit (INDEPENDENT_AMBULATORY_CARE_PROVIDER_SITE_OTHER): Payer: Medicare Other

## 2018-04-06 ENCOUNTER — Telehealth: Payer: Self-pay | Admitting: *Deleted

## 2018-04-06 DIAGNOSIS — Z7901 Long term (current) use of anticoagulants: Secondary | ICD-10-CM

## 2018-04-06 LAB — PROTIME-INR
INR: 3 ratio — AB (ref 0.8–1.0)
PROTHROMBIN TIME: 34.8 s — AB (ref 9.6–13.1)

## 2018-04-06 NOTE — Telephone Encounter (Signed)
Patient coming into office before three for PT-INR.

## 2018-04-06 NOTE — Telephone Encounter (Signed)
Patient taking Cipro and Flagyl for diverticulitis started on 04/05/18

## 2018-04-06 NOTE — Addendum Note (Signed)
Addended by: Leone Haven on: 04/06/2018 12:34 PM   Modules accepted: Orders

## 2018-04-06 NOTE — Telephone Encounter (Signed)
Pt states she received  PT/INR results yesterday. She did call back to confirm dosage.  Called today as she states she forgot to mention she has been taking 2 antibiotics for diverticulosis and that may affect results. Pt states she takes her coumadin at 5pm. Request CB prior to that time if possible. Please advise: [phone]

## 2018-04-06 NOTE — Telephone Encounter (Signed)
Given that she is going on to ciprofloxacin and Flagyl that could cause her INR to increase.  I would like to recheck her INR today and then she will likely need it rechecked again next week.  I will have Juliann Pulse contact the patient to see if she can come in today to have it rechecked. If she is not able to get here before our last lab pick up we could have her go to the lab at the hospital to have this done.

## 2018-04-09 ENCOUNTER — Other Ambulatory Visit (INDEPENDENT_AMBULATORY_CARE_PROVIDER_SITE_OTHER): Payer: Medicare Other

## 2018-04-09 ENCOUNTER — Other Ambulatory Visit: Payer: Self-pay | Admitting: Radiology

## 2018-04-09 DIAGNOSIS — Z7901 Long term (current) use of anticoagulants: Secondary | ICD-10-CM | POA: Diagnosis not present

## 2018-04-09 DIAGNOSIS — Z5181 Encounter for therapeutic drug level monitoring: Secondary | ICD-10-CM | POA: Diagnosis not present

## 2018-04-09 LAB — PROTIME-INR
INR: 2.9 ratio — ABNORMAL HIGH (ref 0.8–1.0)
PROTHROMBIN TIME: 33.4 s — AB (ref 9.6–13.1)

## 2018-04-12 ENCOUNTER — Telehealth: Payer: Self-pay

## 2018-04-12 DIAGNOSIS — Z7901 Long term (current) use of anticoagulants: Secondary | ICD-10-CM

## 2018-04-12 NOTE — Telephone Encounter (Signed)
Pt was informed that she needs to return to INR rechecked again in one week. Pt gave a verbal understanding. Scheduled pt for lab appt. Lab has been ordered.

## 2018-04-13 ENCOUNTER — Other Ambulatory Visit: Payer: Self-pay | Admitting: Internal Medicine

## 2018-04-18 ENCOUNTER — Other Ambulatory Visit (INDEPENDENT_AMBULATORY_CARE_PROVIDER_SITE_OTHER): Payer: Medicare Other

## 2018-04-18 ENCOUNTER — Telehealth: Payer: Self-pay

## 2018-04-18 DIAGNOSIS — Z7901 Long term (current) use of anticoagulants: Secondary | ICD-10-CM | POA: Diagnosis not present

## 2018-04-18 LAB — PROTIME-INR
INR: 7.5 ratio — AB (ref 0.8–1.0)
Prothrombin Time: 83.9 s (ref 9.6–13.1)

## 2018-04-18 NOTE — Telephone Encounter (Signed)
Called patient and advised her to stop Coumadin and hold until re-check. Lab appointment scheduled for Friday

## 2018-04-18 NOTE — Telephone Encounter (Signed)
CRITICAL VALUE STICKER  CRITICAL VALUE: PT 83.9                                 INR 7.5  RECEIVER (on-site recipient of call): Kristen  DATE & TIME NOTIFIED: 04/18/18 MESSENGER (representative from lab): Delorise Jackson  MD NOTIFIED: Dr Derrel Nip  TIME OF NOTIFICATION: 4:44  RESPONSE:

## 2018-04-18 NOTE — Telephone Encounter (Signed)
Call her tonight,  Stop the coumadin immediately   Repeat INR on Friday

## 2018-04-19 ENCOUNTER — Other Ambulatory Visit: Payer: Self-pay | Admitting: Radiology

## 2018-04-19 DIAGNOSIS — Z7901 Long term (current) use of anticoagulants: Principal | ICD-10-CM

## 2018-04-19 DIAGNOSIS — Z5181 Encounter for therapeutic drug level monitoring: Secondary | ICD-10-CM

## 2018-04-20 ENCOUNTER — Other Ambulatory Visit (INDEPENDENT_AMBULATORY_CARE_PROVIDER_SITE_OTHER): Payer: Medicare Other

## 2018-04-20 ENCOUNTER — Telehealth: Payer: Self-pay | Admitting: Internal Medicine

## 2018-04-20 DIAGNOSIS — Z5181 Encounter for therapeutic drug level monitoring: Secondary | ICD-10-CM

## 2018-04-20 DIAGNOSIS — Z7901 Long term (current) use of anticoagulants: Secondary | ICD-10-CM | POA: Diagnosis not present

## 2018-04-20 LAB — PROTIME-INR
INR: 2.9 ratio — AB (ref 0.8–1.0)
Prothrombin Time: 33.1 s — ABNORMAL HIGH (ref 9.6–13.1)

## 2018-04-20 NOTE — Telephone Encounter (Signed)
Patient states that she was on an antibiotic for two weeks and taking 4mg  of coumadin. She took the last of her antibiotic on yesterday 04-19-18. I informed her to restart previous dose of coumadin. She has been scheduled to get PT/INR in one week

## 2018-04-20 NOTE — Telephone Encounter (Signed)
Per review of chart, pt was on abx.  INR increase while on abx.  Previously had been controlled.  Need to know if off abx now.  Also need to know what dose of coumadin she was taking.  INR ok now.  If off abx, then restart previous coumadin dose and recheck pt/inr in one week.  Please document dose of coumadin taking.

## 2018-04-21 NOTE — Telephone Encounter (Signed)
FYI.  See attached.  She took her last abx  04/19/18 and is off now.  Since her INR was ok previously, I just had her resume her previous dose and was asked to return in one week for pt/inr.

## 2018-04-25 ENCOUNTER — Ambulatory Visit: Admit: 2018-04-25 | Discharge: 2018-04-26 | Payer: MEDICARE

## 2018-04-25 ENCOUNTER — Other Ambulatory Visit: Payer: Self-pay | Admitting: Radiology

## 2018-04-25 DIAGNOSIS — M3214 Glomerular disease in systemic lupus erythematosus: Secondary | ICD-10-CM

## 2018-04-25 DIAGNOSIS — L930 Discoid lupus erythematosus: Principal | ICD-10-CM

## 2018-04-25 DIAGNOSIS — K224 Dyskinesia of esophagus: Secondary | ICD-10-CM | POA: Diagnosis not present

## 2018-04-25 DIAGNOSIS — Z9049 Acquired absence of other specified parts of digestive tract: Secondary | ICD-10-CM | POA: Diagnosis not present

## 2018-04-25 DIAGNOSIS — Z86718 Personal history of other venous thrombosis and embolism: Secondary | ICD-10-CM | POA: Diagnosis not present

## 2018-04-25 DIAGNOSIS — Z931 Gastrostomy status: Secondary | ICD-10-CM | POA: Diagnosis not present

## 2018-04-25 DIAGNOSIS — Z5181 Encounter for therapeutic drug level monitoring: Secondary | ICD-10-CM

## 2018-04-25 DIAGNOSIS — Z7901 Long term (current) use of anticoagulants: Secondary | ICD-10-CM | POA: Diagnosis not present

## 2018-04-25 DIAGNOSIS — R5383 Other fatigue: Secondary | ICD-10-CM | POA: Diagnosis not present

## 2018-04-25 DIAGNOSIS — M3219 Other organ or system involvement in systemic lupus erythematosus: Secondary | ICD-10-CM | POA: Diagnosis not present

## 2018-04-25 DIAGNOSIS — M8008XA Age-related osteoporosis with current pathological fracture, vertebra(e), initial encounter for fracture: Secondary | ICD-10-CM | POA: Diagnosis not present

## 2018-04-25 DIAGNOSIS — R1310 Dysphagia, unspecified: Secondary | ICD-10-CM | POA: Diagnosis not present

## 2018-04-25 DIAGNOSIS — I272 Pulmonary hypertension, unspecified: Secondary | ICD-10-CM | POA: Diagnosis not present

## 2018-04-25 MED ORDER — MYCOPHENOLATE MOFETIL 500 MG TABLET
ORAL_TABLET | Freq: Two times a day (BID) | ORAL | 3 refills | 0 days | Status: CP
Start: 2018-04-25 — End: 2019-01-03

## 2018-04-25 MED ORDER — PREDNISONE 5 MG TABLET: 5 mg | tablet | Freq: Every day | 5 refills | 0 days | Status: AC

## 2018-04-25 MED ORDER — HYDROXYCHLOROQUINE 200 MG TABLET
ORAL_TABLET | Freq: Every day | ORAL | 3 refills | 0.00000 days | Status: CP
Start: 2018-04-25 — End: 2019-01-15

## 2018-04-25 MED ORDER — PREDNISONE 5 MG TABLET
ORAL_TABLET | Freq: Every day | ORAL | 5 refills | 0.00000 days | Status: CP
Start: 2018-04-25 — End: 2018-11-28

## 2018-04-26 ENCOUNTER — Other Ambulatory Visit (INDEPENDENT_AMBULATORY_CARE_PROVIDER_SITE_OTHER): Payer: Medicare Other

## 2018-04-26 DIAGNOSIS — Z7901 Long term (current) use of anticoagulants: Secondary | ICD-10-CM | POA: Diagnosis not present

## 2018-04-26 DIAGNOSIS — Z5181 Encounter for therapeutic drug level monitoring: Secondary | ICD-10-CM

## 2018-04-26 LAB — PROTIME-INR
INR: 3.3 ratio — ABNORMAL HIGH (ref 0.8–1.0)
PROTHROMBIN TIME: 37.3 s — AB (ref 9.6–13.1)

## 2018-04-27 ENCOUNTER — Other Ambulatory Visit: Payer: Self-pay | Admitting: Internal Medicine

## 2018-04-27 MED ORDER — WARFARIN SODIUM 3 MG PO TABS: 3.0000 mg | ORAL_TABLET | Freq: Every day | ORAL | 2 refills | Status: DC

## 2018-05-07 ENCOUNTER — Telehealth: Payer: Self-pay

## 2018-05-07 NOTE — Telephone Encounter (Signed)
Copied from [place] [phone]. Topic: General - Other >> May 07, 2018 12:43 PM Carolyn Stare wrote:  Pt had an appt on 05/08/18 and it was cancel and she said she was not made aware that her appt was cancel . She need to reschedule because she need to get her med fup appt .Please contact pt for appt

## 2018-05-08 ENCOUNTER — Encounter: Payer: Self-pay | Admitting: Internal Medicine

## 2018-05-08 ENCOUNTER — Ambulatory Visit: Payer: Medicare Other | Admitting: Internal Medicine

## 2018-05-08 ENCOUNTER — Ambulatory Visit (INDEPENDENT_AMBULATORY_CARE_PROVIDER_SITE_OTHER): Payer: Medicare Other

## 2018-05-08 ENCOUNTER — Ambulatory Visit (INDEPENDENT_AMBULATORY_CARE_PROVIDER_SITE_OTHER): Payer: Medicare Other | Admitting: Internal Medicine

## 2018-05-08 VITALS — BP 128/84 | HR 96 | Temp 98.4°F | Resp 16 | Wt 123.2 lb

## 2018-05-08 DIAGNOSIS — G8929 Other chronic pain: Secondary | ICD-10-CM | POA: Diagnosis not present

## 2018-05-08 DIAGNOSIS — G4736 Sleep related hypoventilation in conditions classified elsewhere: Secondary | ICD-10-CM

## 2018-05-08 DIAGNOSIS — M25551 Pain in right hip: Secondary | ICD-10-CM | POA: Diagnosis not present

## 2018-05-08 DIAGNOSIS — R110 Nausea: Secondary | ICD-10-CM

## 2018-05-08 DIAGNOSIS — D631 Anemia in chronic kidney disease: Secondary | ICD-10-CM | POA: Diagnosis not present

## 2018-05-08 DIAGNOSIS — E039 Hypothyroidism, unspecified: Secondary | ICD-10-CM

## 2018-05-08 DIAGNOSIS — Z7901 Long term (current) use of anticoagulants: Secondary | ICD-10-CM

## 2018-05-08 DIAGNOSIS — N183 Chronic kidney disease, stage 3 unspecified: Secondary | ICD-10-CM

## 2018-05-08 DIAGNOSIS — I2729 Other secondary pulmonary hypertension: Secondary | ICD-10-CM

## 2018-05-08 MED ORDER — OXYCODONE-ACETAMINOPHEN 5-325 MG PO TABS: 1.0000 | ORAL_TABLET | Freq: Three times a day (TID) | ORAL | 0 refills | Status: DC | PRN

## 2018-05-08 NOTE — Patient Instructions (Signed)
Please let DR Angelica Chessman know  About your nausea so you can get an EGD scheduled with your upcoming colonoscopy

## 2018-05-08 NOTE — Progress Notes (Addendum)
Subjective:  Patient ID: Diana [name], female    DOB: 14-Aug-1935  Age: 83 y.o. MRN: [id number]  CC: The primary encounter diagnosis was Nausea. Diagnoses of Encounter for current long-term use of anticoagulants, Chronic right hip pain, Nausea in adult, Hypothyroidism, unspecified type, Anemia due to stage 3 chronic kidney disease (HCC), and Nocturnal hypoxemia due to pulmonary hypertension (HCC) were also pertinent to this visit.  HPI Diana [name] presents for follow up on multiple chronic issues .  Last seen in Feb 09, 2023  Brother died of non alcoholic cirrhosis last week . Her other brother had alcoholic cirrhosis    "something is wrong"  Worried that she has cancer because she has been feeling nauseated  every morning upon waking,  Nausea is present most of the day . Using phenergan bc zofran did not work. .  Not affected by eating.  taking Protonix In the morning  before eating.  Develops a dull ache in the epigastric area na lower abdominal area after her bowel movements which are soft but formed.,  Gets more nauseated until she takes her phenergran and her pain medication . Has tried eliminating tomatoes,  All things with seeds,  No change. .  Till taking sucralfate twice daily   Has been treated for diverticulitis twice this year.  Last time in July occurred after eating tomatoes and popcorn  ,  Was confirmed with CT .  Colonoscopy planned for sept 16 by [facility]   Increasing right hip pain for several months.  .  Bothering her more than her chronic low back pain.   Hurts to bear weight and to walk distances > 50 feet .  Has not seen Orthopedics for this issues, but had an ESI  in July 2017 of L/s Spine  Without much relief.  Used to see Deeann Saint.    Saw [facility]  for SLE in early August   Breast cancer  S/p left lumpectomy 2015   Last mammogram jan dr Smith Robert managing   Anticoagulation due to to recurrent DVT Leg   Taking coumadin,  Monthly INRs  In range x 2  Weeks after IN >  7 in late July   regiemn is 3 mg alt with 4 mg   osteoporosis with L2 fracture s/p kyphoplasty in 2017    Chronic respiratory failure  .  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   Has a colonoscopy planned for sept 16 with [place]  Erosive esophagitis due to CMV    Outpatient Medications Prior to Visit  Medication Sig Dispense Refill  . Ascorbic Acid (VITAMIN C) 1000 MG tablet Take 1,000 mg by mouth daily.    . Calcium Carbonate-Vitamin D 600-400 MG-UNIT tablet Take 1 tablet by mouth daily.    . cyanocobalamin (,VITAMIN B-12,) 1000 MCG/ML injection Inject 1 mL (1,000 mcg total) into the muscle every 30 (thirty) days. Pt uses on the 1st of every month. 10 mL 1  . furosemide (LASIX) 20 MG tablet 1 tablet daily as needed for fluid retention (Patient not taking: Reported on 06/28/2018) 30 tablet 3  . hydroxychloroquine (PLAQUENIL) 200 MG tablet Take 200 mg by mouth daily.    Marland Kitchen letrozole (FEMARA) 2.5 MG tablet Take 1 tablet (2.5 mg total) by mouth daily. 30 tablet 9  . metoprolol succinate (TOPROL-XL) 25 MG 24 hr tablet Take 12.5 mg by mouth daily.     . mycophenolate (CELLCEPT) 500 MG tablet Take  1,000 mg by mouth 2 (two) times daily.    Marland Kitchen nystatin cream (MYCOSTATIN) Apply 1 application topically 2 (two) times daily. (Patient not taking: Reported on 06/28/2018) 30 g 0  . ondansetron (ZOFRAN ODT) 4 MG disintegrating tablet Take 1 tablet (4 mg total) by mouth every 8 (eight) hours as needed for nausea or vomiting. 20 tablet 0  . OXYGEN Inhale 2 mLs into the lungs at bedtime.    . pantoprazole (PROTONIX) 40 MG tablet Take 40 mg by mouth 2 (two) times daily.    . Polyvinyl Alcohol-Povidone (REFRESH OP) Place 2 drops into both eyes daily as needed (dry eyes).     . predniSONE (DELTASONE) 5 MG tablet Take 5 mg by mouth daily.     . promethazine (PHENERGAN) 12.5 MG tablet Take 1 tablet (12.5 mg total) by mouth every 8 (eight) hours as needed  for nausea or vomiting. 20 tablet 5  . Saccharomyces boulardii (PROBIOTIC) 250 MG CAPS Take 1 tablet by mouth daily. 14 capsule   . sucralfate (CARAFATE) 1 GM/10ML suspension Take 1 g by mouth 4 (four) times daily. Before meals and nightly    . SYNTHROID 150 MCG tablet TAKE 1 TABLET BY MOUTH EVERY DAY 30 tablet 5  . Syringe/Needle, Disp, (SYRINGE 3CC/25GX1") 25G X 1" 3 ML MISC Use for b12 injections 50 each 0  . Tadalafil, PAH, (ADCIRCA) 20 MG TABS Take 40 mg by mouth daily.    . [employer] ALLERGY RELIEF 25 MG tablet TAKE 2 TABLETS (50 MG TOTAL) BY MOUTH ONCE FOR 1 DOSE 1 HOUR PRIOR TO CTU.  0  . LINZESS 145 MCG CAPS capsule TAKE 1 CAPSULE (145 MCG TOTAL) BY MOUTH DAILY BEFORE BREAKFAST. 30 capsule 2  . metoCLOPramide (REGLAN) 10 MG tablet Take 1 tablet (10 mg total) by mouth 4 (four) times daily -  before meals and at bedtime. 360 tablet 1  . oxyCODONE-acetaminophen (PERCOCET/ROXICET) 5-325 MG tablet Take 1 tablet by mouth every 8 (eight) hours as needed for severe pain. 65 tablet 0  . warfarin (COUMADIN) 3 MG tablet Take 1 tablet (3 mg total) by mouth daily. Alternating with 4 mg 30 tablet 2  . warfarin (COUMADIN) 4 MG tablet TAKE 1 TABLET BY MOUTH EVERY DAY 30 tablet 2  . Lactulose 20 GM/30ML SOLN 30 ml every 4 hours until constipation is relieved (Patient not taking: Reported on 05/08/2018) 236 mL 3  . doxycycline (VIBRAMYCIN) 100 MG capsule     . lisinopril (PRINIVIL,ZESTRIL) 5 MG tablet TAKE 1 TABLET (5 MG TOTAL) BY MOUTH DAILY. (Patient not taking: Reported on 05/08/2018) 90 tablet 3  . potassium chloride 20 MEQ/15ML (10%) SOLN Take 15 mLs (20 mEq total) by mouth daily. On days taking furosemide (Patient not taking: Reported on 05/08/2018) 240 mL 3  . ranitidine (ZANTAC) 150 MG tablet TAKE 1 TABLET (150 MG TOTAL) BY MOUTH ONCE FOR 1 DOSE. TAKE 1 TABLET 1 HOUR PRIOR TO CTU  0  . sterile water for irrigation 237 mL Give 2.5 mL/hr by tube continuous.     No facility-administered medications prior to  visit.     Review of Systems;  Patient denies headache, fevers, malaise, unintentional weight loss, skin rash, eye pain, sinus congestion and sinus pain, sore throat, dysphagia,  hemoptysis , cough, dyspnea, wheezing, chest pain, palpitations, orthopnea, edema, abdominal pain, nausea, melena, diarrhea, constipation, flank pain, dysuria, hematuria, urinary  Frequency, nocturia, numbness, tingling, seizures,  Focal weakness, Loss of consciousness,  Tremor, insomnia, depression, anxiety, and  suicidal ideation.      Objective:  BP 128/84 (BP Location: Right Arm, Patient Position: Sitting, Cuff Size: Normal)   Pulse 96   Temp 98.4 F (36.9 C) (Oral)   Resp 16   Wt 123 lb 4 oz (55.9 kg)   SpO2 96%   BMI 23.29 kg/m   BP Readings from Last 3 Encounters:  06/28/18 114/75  06/04/18 110/63  05/08/18 128/84    Wt Readings from Last 3 Encounters:  06/28/18 121 lb 4 oz (55 kg)  06/04/18 122 lb (55.3 kg)  05/08/18 123 lb 4 oz (55.9 kg)    General appearance: alert, cooperative and appears stated age Ears: normal TM's and external ear canals both ears Throat: lips, mucosa, and tongue normal; teeth and gums normal Neck: no adenopathy, no carotid bruit, supple, symmetrical, trachea midline and thyroid not enlarged, symmetric, no tenderness/mass/nodules Back: symmetric, no curvature. ROM normal. No CVA tenderness. Lungs: clear to auscultation bilaterally Heart: regular rate and rhythm, S1, S2 normal, no murmur, click, rub or gallop Abdomen: soft, non-tender; bowel sounds normal; no masses,  no organomegaly Pulses: 2+ and symmetric Skin: Skin color, texture, turgor normal. No rashes or lesions Lymph nodes: Cervical, supraclavicular, and axillary nodes normal.  Lab Results  Component Value Date   HGBA1C 6.4 02/05/2018   HGBA1C 6.2 10/18/2017   HGBA1C 6.3 05/17/2017    Lab Results  Component Value Date   CREATININE 1.71 (H) 06/28/2018   CREATININE 1.49 (H) 06/20/2018   CREATININE  1.44 (H) 05/08/2018    Lab Results  Component Value Date   WBC 5.9 06/28/2018   HGB 9.3 (L) 06/28/2018   HCT 30.5 (L) 06/28/2018   PLT 445 (H) 06/28/2018   GLUCOSE 127 (H) 06/28/2018   CHOL 164 10/11/2013   TRIG 152.0 (H) 10/11/2013   HDL 69.70 10/11/2013   LDLDIRECT 50.0 05/09/2016   LDLCALC 64 10/11/2013   ALT 10 06/28/2018   AST 21 06/28/2018   NA 139 06/28/2018   K 4.3 06/28/2018   CL 108 06/28/2018   CREATININE 1.71 (H) 06/28/2018   BUN 37 (H) 06/28/2018   CO2 20 (L) 06/28/2018   TSH 0.53 05/10/2018   INR 2.3 (H) 07/04/2018   HGBA1C 6.4 02/05/2018   MICROALBUR 8.7 (H) 09/14/2016    Ct Abdomen Pelvis Wo Contrast  Result Date: 04/05/2018 CLINICAL DATA:  Lower abdominal pain, nausea. History of breast cancer. EXAM: CT ABDOMEN AND PELVIS WITHOUT CONTRAST TECHNIQUE: Multidetector CT imaging of the abdomen and pelvis was performed following the standard protocol without IV contrast. COMPARISON:  12/21/2017 FINDINGS: Lower chest: Large hiatal hernia. Heart is mildly enlarged. Areas of scarring in the lung bases. No effusions. Hepatobiliary: No focal hepatic abnormality. Gallbladder unremarkable. Pancreas: No focal abnormality or ductal dilatation. Spleen: Absence of the spleen, presumed prior splenectomy. Adrenals/Urinary Tract: Small low-density areas in the kidneys bilaterally, likely cysts. No hydronephrosis. Adrenal glands and urinary bladder unremarkable. Stomach/Bowel: Diffuse colonic diverticulosis. There is an area of wall thickening noted in the mid sigmoid colon. Slight surrounding inflammatory change. I suspect these changes are related to acute diverticulitis although annular colon cancer could have a similar appearance. No evidence of bowel obstruction. Stomach and small bowel grossly unremarkable. Vascular/Lymphatic: Aortic atherosclerosis. No enlarged abdominal or pelvic lymph nodes. Reproductive: Uterus and adnexa unremarkable.  No mass. Other: No free fluid or free air.  Musculoskeletal: Severe rightward scoliosis. Prior vertebroplasty in the lumbar spine. Diffuse degenerative changes. IMPRESSION: Diffuse colonic diverticulosis. Wall thickening and mild surrounding inflammation  in the mid sigmoid colon, likely acute diverticulitis. Colon cancer could have a similar appearance. Recommend correlation with recent colonoscopy or follow-up colonoscopy after treatment and resolution of diverticulitis. Large hiatal hernia. Diffuse aortic atherosclerosis. Electronically Signed   By: Charlett Nose M.D.   On: 04/05/2018 14:18    Assessment & Plan:   Problem List Items Addressed This Visit    Absolute anemia    Chronic,  Persistent,  Multifactorial Both iron and b12 deficient by prior labs.  . Historically improved with iron infusions . Iron studies  Normal in May.  For colonoscopy next month  Lab Results  Component Value Date   WBC 6.1 05/08/2018   HGB 9.4 (L) 05/08/2018   HCT 30.1 (L) 05/08/2018   MCV 99.8 05/08/2018   PLT 362.0 05/08/2018         Chronic right hip pain    Etiology may by DJD vs AVN hip given history of steroid use. Referring to Dr Hyacinth Meeker for evaluation       Relevant Medications   oxyCODONE-acetaminophen (PERCOCET/ROXICET) 5-325 MG tablet   oxyCODONE-acetaminophen (PERCOCET/ROXICET) 5-325 MG tablet   Other Relevant Orders   Ambulatory referral to Orthopedic Surgery   Hypothyroidism    Overactive by last TSh in March  During hospitalization for diverticulitis. Unclear if dose was reduced,  Repeat tsh needed   Lab Results  Component Value Date   TSH 0.058 (L) 11/29/2017         Relevant Orders   TSH (Completed)   Nausea in adult    Occurring daily despite twice daily sucralfate and PPI.    She has a complicated GI history including gastric volvulus in 2016 requiring gastroplexy,   severe erosive esophagitis and a very large hiatal hernia .  Ruling out UTI   Recommend getting EGD next moth with colonoscopy.       Nocturnal hypoxemia  due to pulmonary hypertension (HCC)    She is using supplemental oxygen by nasal cannula at 2L/min for nocturnal hypoxia.  She notes improved quality of sleep and improved energy level since starting to use the oxygen.        Other Visit Diagnoses    Nausea    -  Primary   Relevant Orders   Comprehensive metabolic panel (Completed)   Urinalysis, Routine w reflex microscopic (Completed)   Urine Culture (Completed)   DG Abd 1 View (Completed)   Encounter for current long-term use of anticoagulants       Relevant Orders   CBC with Differential/Platelet (Completed)   INR/PT (Completed)      I have discontinued [name] Branham's sterile water for irrigation 237 mL, lisinopril, potassium chloride, doxycycline, and ranitidine. I am also having her maintain her hydroxychloroquine, tadalafil (PAH), predniSONE, metoprolol succinate, mycophenolate, Polyvinyl Alcohol-Povidone (REFRESH OP), OXYGEN, Calcium Carbonate-Vitamin D, pantoprazole, cyanocobalamin, SYRINGE 3CC/25GX1", vitamin C, sucralfate, promethazine, nystatin cream, Lactulose, ondansetron, Probiotic, furosemide, letrozole, SYNTHROID, oxyCODONE-acetaminophen, and oxyCODONE-acetaminophen.  Meds ordered this encounter  Medications  . DISCONTD: oxyCODONE-acetaminophen (PERCOCET/ROXICET) 5-325 MG tablet    Sig: Take 1 tablet by mouth every 8 (eight) hours as needed for severe pain.    Dispense:  65 tablet    Refill:  0    May refill on or after May 09 2018  . oxyCODONE-acetaminophen (PERCOCET/ROXICET) 5-325 MG tablet    Sig: Take 1 tablet by mouth every 8 (eight) hours as needed for severe pain.    Dispense:  65 tablet    Refill:  0  May refill on or after June 07 2018  . DISCONTD: oxyCODONE-acetaminophen (PERCOCET/ROXICET) 5-325 MG tablet    Sig: Take 1 tablet by mouth every 8 (eight) hours as needed for severe pain.    Dispense:  65 tablet    Refill:  0    May refill on or after Jul 07 2018  . oxyCODONE-acetaminophen  (PERCOCET/ROXICET) 5-325 MG tablet    Sig: Take 1 tablet by mouth every 8 (eight) hours as needed for severe pain.    Dispense:  65 tablet    Refill:  0    May refill on or after Jul 07 2018    Medications Discontinued During This Encounter  Medication Reason  . lisinopril (PRINIVIL,ZESTRIL) 5 MG tablet No longer needed (for PRN medications)  . potassium chloride 20 MEQ/15ML (10%) SOLN No longer needed (for PRN medications)  . ranitidine (ZANTAC) 150 MG tablet No longer needed (for PRN medications)  . sterile water for irrigation 237 mL No longer needed (for PRN medications)  . doxycycline (VIBRAMYCIN) 100 MG capsule Completed Course  . oxyCODONE-acetaminophen (PERCOCET/ROXICET) 5-325 MG tablet Reorder  . oxyCODONE-acetaminophen (PERCOCET/ROXICET) 5-325 MG tablet     Follow-up: No follow-ups on file.   Sherlene Shams, MD

## 2018-05-09 ENCOUNTER — Encounter: Payer: Self-pay | Admitting: Internal Medicine

## 2018-05-09 DIAGNOSIS — M25551 Pain in right hip: Secondary | ICD-10-CM

## 2018-05-09 DIAGNOSIS — G8929 Other chronic pain: Secondary | ICD-10-CM | POA: Insufficient documentation

## 2018-05-09 LAB — URINALYSIS, ROUTINE W REFLEX MICROSCOPIC
Bilirubin Urine: NEGATIVE
HGB URINE DIPSTICK: NEGATIVE
KETONES UR: NEGATIVE
NITRITE: NEGATIVE
Specific Gravity, Urine: 1.025 (ref 1.000–1.030)
URINE GLUCOSE: NEGATIVE
UROBILINOGEN UA: 0.2 (ref 0.0–1.0)
pH: 5 (ref 5.0–8.0)

## 2018-05-09 LAB — COMPREHENSIVE METABOLIC PANEL
ALBUMIN: 3.6 g/dL (ref 3.5–5.2)
ALT: 6 U/L (ref 0–35)
AST: 15 U/L (ref 0–37)
Alkaline Phosphatase: 51 U/L (ref 39–117)
BUN: 32 mg/dL — ABNORMAL HIGH (ref 6–23)
CALCIUM: 9.5 mg/dL (ref 8.4–10.5)
CHLORIDE: 107 meq/L (ref 96–112)
CO2: 20 meq/L (ref 19–32)
Creatinine, Ser: 1.44 mg/dL — ABNORMAL HIGH (ref 0.40–1.20)
GFR: 37.1 mL/min — ABNORMAL LOW (ref >60.00)
Glucose, Bld: 119 mg/dL — ABNORMAL HIGH (ref 70–99)
Potassium: 4.5 mEq/L (ref 3.5–5.1)
SODIUM: 139 meq/L (ref 135–145)
Total Bilirubin: 0.3 mg/dL (ref 0.2–1.2)
Total Protein: 7.3 g/dL (ref 6.0–8.3)

## 2018-05-09 LAB — CBC WITH DIFFERENTIAL/PLATELET
BASOS PCT: 1.1 % (ref 0.0–3.0)
Basophils Absolute: 0.1 10*3/uL (ref 0.0–0.1)
EOS PCT: 1.9 % (ref 0.0–5.0)
Eosinophils Absolute: 0.1 10*3/uL (ref 0.0–0.7)
HEMATOCRIT: 30.1 % — AB (ref 36.0–46.0)
HEMOGLOBIN: 9.4 g/dL — AB (ref 12.0–15.0)
LYMPHS PCT: 13.4 % (ref 12.0–46.0)
Lymphs Abs: 0.8 10*3/uL (ref 0.7–4.0)
MCHC: 31.4 g/dL (ref 30.0–36.0)
MCV: 99.8 fl (ref 78.0–100.0)
MONO ABS: 0.6 10*3/uL (ref 0.1–1.0)
Monocytes Relative: 9.1 % (ref 3.0–12.0)
Neutro Abs: 4.5 10*3/uL (ref 1.4–7.7)
Neutrophils Relative %: 74.5 % (ref 43.0–77.0)
Platelets: 362 10*3/uL (ref 150.0–400.0)
RBC: 3.01 Mil/uL — AB (ref 3.87–5.11)
RDW: 14.5 % (ref 11.5–15.5)
WBC: 6.1 10*3/uL (ref 4.0–10.5)

## 2018-05-09 LAB — URINE CULTURE
MICRO NUMBER:: 90990813
SPECIMEN QUALITY:: ADEQUATE

## 2018-05-09 LAB — PROTIME-INR
INR: 2 — ABNORMAL HIGH
PROTHROMBIN TIME: 21 s — AB (ref 9.0–11.5)

## 2018-05-09 NOTE — Assessment & Plan Note (Addendum)
Etiology may by DJD vs AVN hip given history of steroid use. Referring to Dr Sabra Heck for evaluation

## 2018-05-09 NOTE — Assessment & Plan Note (Addendum)
Occurring daily despite twice daily sucralfate and PPI.    She has a complicated GI history including gastric volvulus in 2016 requiring gastroplexy,   severe erosive esophagitis and a very large hiatal hernia .  Ruling out UTI   Recommend getting EGD next moth with colonoscopy.

## 2018-05-09 NOTE — Assessment & Plan Note (Addendum)
Chronic,  Persistent,  Multifactorial Both iron and b12 deficient by prior labs.  . Historically improved with iron infusions . Iron studies  Normal in May.  For colonoscopy next month  Lab Results  Component Value Date   WBC 6.1 05/08/2018   HGB 9.4 (L) 05/08/2018   HCT 30.1 (L) 05/08/2018   MCV 99.8 05/08/2018   PLT 362.0 05/08/2018

## 2018-05-09 NOTE — Assessment & Plan Note (Addendum)
Overactive by last TSh in March  During hospitalization for diverticulitis. Unclear if dose was reduced,  Repeat tsh needed   Lab Results  Component Value Date   TSH 0.058 (L) 11/29/2017

## 2018-05-10 ENCOUNTER — Other Ambulatory Visit (INDEPENDENT_AMBULATORY_CARE_PROVIDER_SITE_OTHER): Payer: Medicare Other

## 2018-05-10 DIAGNOSIS — E039 Hypothyroidism, unspecified: Secondary | ICD-10-CM

## 2018-05-10 LAB — TSH: TSH: 0.53 u[IU]/mL (ref 0.35–4.50)

## 2018-05-21 ENCOUNTER — Other Ambulatory Visit: Payer: Self-pay | Admitting: Internal Medicine

## 2018-05-21 DIAGNOSIS — E1121 Type 2 diabetes mellitus with diabetic nephropathy: Secondary | ICD-10-CM

## 2018-05-22 DIAGNOSIS — L111 Transient acantholytic dermatosis [Grover]: Secondary | ICD-10-CM | POA: Diagnosis not present

## 2018-05-22 DIAGNOSIS — D0471 Carcinoma in situ of skin of right lower limb, including hip: Secondary | ICD-10-CM | POA: Diagnosis not present

## 2018-05-22 DIAGNOSIS — D045 Carcinoma in situ of skin of trunk: Secondary | ICD-10-CM | POA: Diagnosis not present

## 2018-05-22 DIAGNOSIS — Z85828 Personal history of other malignant neoplasm of skin: Secondary | ICD-10-CM | POA: Diagnosis not present

## 2018-05-22 DIAGNOSIS — Z08 Encounter for follow-up examination after completed treatment for malignant neoplasm: Secondary | ICD-10-CM | POA: Diagnosis not present

## 2018-05-22 DIAGNOSIS — L821 Other seborrheic keratosis: Secondary | ICD-10-CM | POA: Diagnosis not present

## 2018-05-22 DIAGNOSIS — D485 Neoplasm of uncertain behavior of skin: Secondary | ICD-10-CM | POA: Diagnosis not present

## 2018-05-22 NOTE — Telephone Encounter (Signed)
Patient is calling and states she takes 1 tablet a day.

## 2018-05-22 NOTE — Telephone Encounter (Signed)
LMTCB. Need to get clarification as to whether pt is taking this medication or if it has been d/c'd or if she only uses it for PRN use.   PEC may speak with pt.

## 2018-05-22 NOTE — Telephone Encounter (Signed)
Ok to refill this? Patient states she takes one tablet daily?

## 2018-05-26 ENCOUNTER — Other Ambulatory Visit: Payer: Self-pay | Admitting: Internal Medicine

## 2018-05-30 DIAGNOSIS — M545 Low back pain: Secondary | ICD-10-CM | POA: Diagnosis not present

## 2018-05-30 DIAGNOSIS — M4186 Other forms of scoliosis, lumbar region: Secondary | ICD-10-CM | POA: Diagnosis not present

## 2018-06-04 ENCOUNTER — Encounter
Admission: RE | Disposition: A | Discharge status: Home / Self Care | Payer: Self-pay | Source: Ambulatory Visit | Attending: Gastroenterology

## 2018-06-04 ENCOUNTER — Ambulatory Visit
Admission: RE | Admit: 2018-06-04 | Discharge: 2018-06-04 | Disposition: A | Discharge status: Home / Self Care | Payer: Medicare Other | Source: Ambulatory Visit | Attending: Gastroenterology | Admitting: Gastroenterology

## 2018-06-04 ENCOUNTER — Ambulatory Visit: Payer: Medicare Other | Admitting: Anesthesiology

## 2018-06-04 ENCOUNTER — Encounter: Payer: Self-pay | Admitting: Anesthesiology

## 2018-06-04 DIAGNOSIS — R1032 Left lower quadrant pain: Secondary | ICD-10-CM | POA: Diagnosis not present

## 2018-06-04 DIAGNOSIS — D122 Benign neoplasm of ascending colon: Secondary | ICD-10-CM | POA: Insufficient documentation

## 2018-06-04 DIAGNOSIS — Z923 Personal history of irradiation: Secondary | ICD-10-CM | POA: Diagnosis not present

## 2018-06-04 DIAGNOSIS — I110 Hypertensive heart disease with heart failure: Secondary | ICD-10-CM | POA: Insufficient documentation

## 2018-06-04 DIAGNOSIS — I509 Heart failure, unspecified: Secondary | ICD-10-CM | POA: Diagnosis not present

## 2018-06-04 DIAGNOSIS — K641 Second degree hemorrhoids: Secondary | ICD-10-CM | POA: Insufficient documentation

## 2018-06-04 DIAGNOSIS — Z79899 Other long term (current) drug therapy: Secondary | ICD-10-CM | POA: Insufficient documentation

## 2018-06-04 DIAGNOSIS — K5792 Diverticulitis of intestine, part unspecified, without perforation or abscess without bleeding: Secondary | ICD-10-CM | POA: Diagnosis not present

## 2018-06-04 DIAGNOSIS — E1151 Type 2 diabetes mellitus with diabetic peripheral angiopathy without gangrene: Secondary | ICD-10-CM | POA: Diagnosis not present

## 2018-06-04 DIAGNOSIS — Z79811 Long term (current) use of aromatase inhibitors: Secondary | ICD-10-CM | POA: Diagnosis not present

## 2018-06-04 DIAGNOSIS — M329 Systemic lupus erythematosus, unspecified: Secondary | ICD-10-CM | POA: Insufficient documentation

## 2018-06-04 DIAGNOSIS — I4891 Unspecified atrial fibrillation: Secondary | ICD-10-CM | POA: Insufficient documentation

## 2018-06-04 DIAGNOSIS — Z79890 Hormone replacement therapy (postmenopausal): Secondary | ICD-10-CM | POA: Insufficient documentation

## 2018-06-04 DIAGNOSIS — Z7901 Long term (current) use of anticoagulants: Secondary | ICD-10-CM | POA: Diagnosis not present

## 2018-06-04 DIAGNOSIS — Z86718 Personal history of other venous thrombosis and embolism: Secondary | ICD-10-CM | POA: Insufficient documentation

## 2018-06-04 DIAGNOSIS — Z7952 Long term (current) use of systemic steroids: Secondary | ICD-10-CM | POA: Insufficient documentation

## 2018-06-04 DIAGNOSIS — D123 Benign neoplasm of transverse colon: Secondary | ICD-10-CM | POA: Insufficient documentation

## 2018-06-04 DIAGNOSIS — K219 Gastro-esophageal reflux disease without esophagitis: Secondary | ICD-10-CM | POA: Diagnosis not present

## 2018-06-04 DIAGNOSIS — D126 Benign neoplasm of colon, unspecified: Secondary | ICD-10-CM | POA: Diagnosis not present

## 2018-06-04 DIAGNOSIS — Z9981 Dependence on supplemental oxygen: Secondary | ICD-10-CM | POA: Diagnosis not present

## 2018-06-04 DIAGNOSIS — K5730 Diverticulosis of large intestine without perforation or abscess without bleeding: Secondary | ICD-10-CM | POA: Insufficient documentation

## 2018-06-04 DIAGNOSIS — K5790 Diverticulosis of intestine, part unspecified, without perforation or abscess without bleeding: Secondary | ICD-10-CM | POA: Diagnosis not present

## 2018-06-04 DIAGNOSIS — Z853 Personal history of malignant neoplasm of breast: Secondary | ICD-10-CM | POA: Diagnosis not present

## 2018-06-04 DIAGNOSIS — I5030 Unspecified diastolic (congestive) heart failure: Secondary | ICD-10-CM | POA: Diagnosis not present

## 2018-06-04 DIAGNOSIS — Z87891 Personal history of nicotine dependence: Secondary | ICD-10-CM | POA: Insufficient documentation

## 2018-06-04 DIAGNOSIS — K635 Polyp of colon: Secondary | ICD-10-CM | POA: Diagnosis not present

## 2018-06-04 DIAGNOSIS — E039 Hypothyroidism, unspecified: Secondary | ICD-10-CM | POA: Diagnosis not present

## 2018-06-04 HISTORY — PX: COLONOSCOPY WITH PROPOFOL: SHX5780

## 2018-06-04 LAB — HM COLONOSCOPY

## 2018-06-04 LAB — PROTIME-INR
INR: 1.22
Prothrombin Time: 15.3 seconds — ABNORMAL HIGH (ref 11.4–15.2)

## 2018-06-04 SURGERY — COLONOSCOPY WITH PROPOFOL
Anesthesia: General

## 2018-06-04 MED ORDER — SODIUM CHLORIDE 0.9 % IV SOLN
INTRAVENOUS | Status: DC
  Administered 2018-06-04: 1000 mL via INTRAVENOUS

## 2018-06-04 MED ORDER — PROPOFOL 10 MG/ML IV BOLUS
INTRAVENOUS | Status: AC
  Filled 2018-06-04: qty 20

## 2018-06-04 MED ORDER — PROPOFOL 10 MG/ML IV BOLUS
INTRAVENOUS | Status: DC | PRN
  Administered 2018-06-04: 20 mg via INTRAVENOUS
  Administered 2018-06-04: 30 mg via INTRAVENOUS
  Administered 2018-06-04: 10 mg via INTRAVENOUS
  Administered 2018-06-04: 20 mg via INTRAVENOUS
  Administered 2018-06-04: 10 mg via INTRAVENOUS
  Administered 2018-06-04: 20 mg via INTRAVENOUS
  Administered 2018-06-04: 10 mg via INTRAVENOUS

## 2018-06-04 MED ORDER — PHENYLEPHRINE HCL 10 MG/ML IJ SOLN
INTRAMUSCULAR | Status: DC | PRN
  Administered 2018-06-04 (×2): 100 ug via INTRAVENOUS
  Administered 2018-06-04: 50 ug via INTRAVENOUS
  Administered 2018-06-04: 100 ug via INTRAVENOUS
  Administered 2018-06-04: 50 ug via INTRAVENOUS
  Administered 2018-06-04: 100 ug via INTRAVENOUS

## 2018-06-04 MED ORDER — PROPOFOL 500 MG/50ML IV EMUL
INTRAVENOUS | Status: DC | PRN
  Administered 2018-06-04: 120 ug/kg/min via INTRAVENOUS

## 2018-06-04 MED ORDER — SODIUM CHLORIDE 0.9 % IV SOLN: INTRAVENOUS | Status: DC

## 2018-06-04 NOTE — Transfer of Care (Signed)
Immediate Anesthesia Transfer of Care Note  Patient: Diana [name]  Procedure(s) Performed: COLONOSCOPY WITH PROPOFOL (N/A )  Patient Location: PACU  Anesthesia Type:General  Level of Consciousness: drowsy  Airway & Oxygen Therapy: Patient Spontanous Breathing  Post-op Assessment: Report given to RN and Post -op Vital signs reviewed and stable  Post vital signs: Reviewed and stable  Last Vitals:  Vitals Value Taken Time  BP    Temp    Pulse 63 06/04/2018  1:41 PM  Resp 24 06/04/2018  1:41 PM  SpO2 94 % 06/04/2018  1:41 PM  Vitals shown include unvalidated device data.  Last Pain:  Vitals:   06/04/18 1340  TempSrc: (P) Tympanic  PainSc:          Complications: No apparent anesthesia complications

## 2018-06-04 NOTE — Op Note (Signed)
[facility] Gastroenterology Patient Name: Diana [name] Procedure Date: 06/04/2018 11:30 AM MRN: [id number] Account #: [id number] Date of Birth: Feb 13, 1935 Admit Type: Outpatient Age: 83 Room: [facility] ENDO ROOM 1 Gender: Female Note Status: Finalized Procedure:            Colonoscopy Indications:          Abdominal pain in the left lower quadrant, Follow-up of                        diverticulitis Providers:            Christena Deem, MD Referring MD:         Duncan Dull, MD (Referring MD) Medicines:            Monitored Anesthesia Care Complications:        No immediate complications. Procedure:            Pre-Anesthesia Assessment:                       - ASA Grade Assessment: III - A patient with severe                        systemic disease.                       After obtaining informed consent, the colonoscope was                        passed under direct vision. Throughout the procedure,                        the patient's blood pressure, pulse, and oxygen                        saturations were monitored continuously. The                        Colonoscope was introduced through the anus and                        advanced to the the cecum, identified by appendiceal                        orifice and ileocecal valve. The quality of the bowel                        preparation was fair except the ascending colon was                        poor requiring multiple rinses and suctioning to clear.                        The patient tolerated the procedure well. Findings:      Many small and large-mouthed diverticula were found in the sigmoid       colon, descending colon, transverse colon and ascending colon.      A 26 mm polyp was found in the proximal transverse colon. The polyp was       sessile. The polyp was removed with a lift and cut technique using a       cold snare and  cold forcep in a piecemeal manner. Resection and       retrieval were  complete. Hemostasis was good without clip placement.      A 37 mm polyp was found in the mid ascending colon. The polyp was       sessile. The polyp was removed with a lift and cut technique using a       cold snare and cold forcep. Resection and retrieval were complete. To       prevent bleeding after the polypectomy, one hemostatic clip was       successfully placed (MR conditional). There was no bleeding at the end       of the maneuver.      A 3 mm polyp was found in the proximal ascending colon. The polyp was       sessile. The polyp was removed with a cold biopsy forceps. Resection and       retrieval were complete.      No current evidence of inflamation or other lesion.      Non-bleeding internal hemorrhoids were found during retroflexion and       during anoscopy. The hemorrhoids were medium-sized and Grade II       (internal hemorrhoids that prolapse but reduce spontaneously). Impression:           - Diverticulosis in the sigmoid colon, in the                        descending colon, in the transverse colon and in the                        ascending colon.                       - One 26 mm polyp in the proximal transverse colon,                        removed using lift and cut and a cold snare. Resected                        and retrieved.                       - One 37 mm polyp in the mid ascending colon, removed                        using lift and cut and a cold snare. Resected and                        retrieved. Clip (MR conditional) was placed.                       - One 3 mm polyp in the proximal ascending colon,                        removed with a cold biopsy forceps. Resected and                        retrieved. Recommendation:       - Discharge patient to home.                       -  Full liquid diet for 2 days, then advance as                        tolerated to low residue diet for 2 days then resume                        regular diet.                        - Await pathology results.                       - Return to GI clinic in 2 weeks. Procedure Code(s):    --- Professional ---                       [phone], Colonoscopy, flexible; with removal of tumor(s),                        polyp(s), or other lesion(s) by snare technique                       [id number], 59, Colonoscopy, flexible; with biopsy, single                        or multiple Diagnosis Code(s):    --- Professional ---                       D12.3, Benign neoplasm of transverse colon (hepatic                        flexure or splenic flexure)                       D12.2, Benign neoplasm of ascending colon                       R10.32, Left lower quadrant pain                       K57.32, Diverticulitis of large intestine without                        perforation or abscess without bleeding                       K57.30, Diverticulosis of large intestine without                        perforation or abscess without bleeding CPT copyright 2017 American Medical Association. All rights reserved. The codes documented in this report are preliminary and upon coder review may  be revised to meet current compliance requirements. Christena Deem, MD 06/04/2018 1:47:52 PM This report has been signed electronically. Number of Addenda: 0 Note Initiated On: 06/04/2018 11:30 AM Scope Withdrawal Time: 0 hours 38 minutes 15 seconds  Total Procedure Duration: 1 hour 39 minutes 2 seconds       [facility]

## 2018-06-04 NOTE — Anesthesia Post-op Follow-up Note (Signed)
Anesthesia QCDR form completed.        

## 2018-06-04 NOTE — Anesthesia Procedure Notes (Signed)
Performed by: Rebie Peale, CRNA Pre-anesthesia Checklist: Patient identified, Emergency Drugs available, Suction available, Patient being monitored and Timeout performed Patient Re-evaluated:Patient Re-evaluated prior to induction Oxygen Delivery Method: Nasal cannula Induction Type: IV induction       

## 2018-06-04 NOTE — Anesthesia Postprocedure Evaluation (Signed)
Anesthesia Post Note  Patient: Diana [name]  Procedure(s) Performed: COLONOSCOPY WITH PROPOFOL (N/A )  Patient location during evaluation: Endoscopy Anesthesia Type: General Level of consciousness: awake and alert Pain management: pain level controlled Vital Signs Assessment: post-procedure vital signs reviewed and stable Respiratory status: spontaneous breathing, nonlabored ventilation, respiratory function stable and patient connected to nasal cannula oxygen Cardiovascular status: blood pressure returned to baseline and stable Postop Assessment: no apparent nausea or vomiting Anesthetic complications: no     Last Vitals:  Vitals:   06/04/18 1340 06/04/18 1350  BP: 110/63   Pulse:    Resp:  20  Temp: (!) 36.1 C   SpO2:      Last Pain:  Vitals:   06/04/18 1410  TempSrc:   PainSc: 0-No pain                 Charlet Harr S

## 2018-06-04 NOTE — Anesthesia Preprocedure Evaluation (Signed)
Anesthesia Evaluation  Patient identified by MRN, date of birth, ID band Patient awake    Reviewed: Allergy & Precautions, NPO status , Patient's Chart, lab work & pertinent test results, reviewed documented beta blocker date and time   Airway Mallampati: II  TM Distance: >3 FB     Dental  (+) Chipped   Pulmonary shortness of breath, pneumonia, resolved, former smoker,           Cardiovascular hypertension, Pt. on medications and Pt. on home beta blockers + Peripheral Vascular Disease and +CHF  + dysrhythmias Atrial Fibrillation      Neuro/Psych  Neuromuscular disease    GI/Hepatic hiatal hernia, PUD, GERD  ,  Endo/Other  diabetes, Type 2Hypothyroidism   Renal/GU Renal disease     Musculoskeletal  (+) Arthritis ,   Abdominal   Peds  Hematology  (+) anemia ,   Anesthesia Other Findings Lupus,sjogren's. Pulmonary hypertension.  Reproductive/Obstetrics                             Anesthesia Physical Anesthesia Plan  ASA: III  Anesthesia Plan: General   Post-op Pain Management:    Induction: Intravenous  PONV Risk Score and Plan:   Airway Management Planned:   Additional Equipment:   Intra-op Plan:   Post-operative Plan:   Informed Consent: I have reviewed the patients History and Physical, chart, labs and discussed the procedure including the risks, benefits and alternatives for the proposed anesthesia with the patient or authorized representative who has indicated his/her understanding and acceptance.     Plan Discussed with: CRNA  Anesthesia Plan Comments:         Anesthesia Quick Evaluation

## 2018-06-04 NOTE — H&P (Signed)
Outpatient short stay form Pre-procedure 06/04/2018 11:28 AM Lollie Sails MD  Primary Physician: Dr. Deborra Medina  Reason for visit: Colonoscopy  History of present illness: Patient is a 83 year old female presenting today as above.  She has a history of being admitted to the hospital with diverticulitis in March 2019.  She seemed to be okay in between however 2 months ago developed symptoms again and was treated as an outpatient.  States that currently she has no diarrhea rectal bleeding however she continues to have some left-sided abdominal pain particularly from the periumbilical line distally.  Tolerating her prep well.  She takes Coumadin and check of her INR this morning showed a 1.22.  Is no other blood thinning agent or aspirin product.    Current Facility-Administered Medications:  .  0.9 %  sodium chloride infusion, , Intravenous, Continuous, Lollie Sails, MD, Last Rate: 20 mL/hr at 06/04/18 1101, 1,000 mL at 06/04/18 1101 .  0.9 %  sodium chloride infusion, , Intravenous, Continuous, Lollie Sails, MD  Medications Prior to Admission  Medication Sig Dispense Refill Last Dose  . Ascorbic Acid (VITAMIN C) 1000 MG tablet Take 1,000 mg by mouth daily.   Past Week at Unknown time  . Calcium Carbonate-Vitamin D 600-400 MG-UNIT tablet Take 1 tablet by mouth daily.   Past Week at Unknown time  . [facility] ALLERGY RELIEF 25 MG tablet TAKE 2 TABLETS (50 MG TOTAL) BY MOUTH ONCE FOR 1 DOSE 1 HOUR PRIOR TO CTU.  0 Past Week at Unknown time  . cyanocobalamin (,VITAMIN B-12,) 1000 MCG/ML injection Inject 1 mL (1,000 mcg total) into the muscle every 30 (thirty) days. Pt uses on the 1st of every month. 10 mL 1 Past Week at Unknown time  . furosemide (LASIX) 20 MG tablet 1 tablet daily as needed for fluid retention 30 tablet 3 Past Week at Unknown time  . hydroxychloroquine (PLAQUENIL) 200 MG tablet Take 200 mg by mouth daily.   Past Week at Unknown time  . letrozole (FEMARA) 2.5 MG tablet  Take 1 tablet (2.5 mg total) by mouth daily. 30 tablet 9 06/03/2018 at Unknown time  . LINZESS 145 MCG CAPS capsule TAKE 1 CAPSULE (145 MCG TOTAL) BY MOUTH DAILY BEFORE BREAKFAST. 30 capsule 2 Past Week at Unknown time  . lisinopril (PRINIVIL,ZESTRIL) 5 MG tablet TAKE 1 TABLET (5 MG TOTAL) BY MOUTH DAILY. 90 tablet 3 06/03/2018 at Unknown time  . metoCLOPramide (REGLAN) 10 MG tablet Take 1 tablet (10 mg total) by mouth 4 (four) times daily -  before meals and at bedtime. 360 tablet 1 06/03/2018 at Unknown time  . metoprolol succinate (TOPROL-XL) 25 MG 24 hr tablet Take 12.5 mg by mouth daily.    06/03/2018 at Unknown time  . mycophenolate (CELLCEPT) 500 MG tablet Take 1,000 mg by mouth 2 (two) times daily.   06/03/2018 at Unknown time  . nystatin cream (MYCOSTATIN) Apply 1 application topically 2 (two) times daily. 30 g 0 06/03/2018 at Unknown time  . ondansetron (ZOFRAN ODT) 4 MG disintegrating tablet Take 1 tablet (4 mg total) by mouth every 8 (eight) hours as needed for nausea or vomiting. 20 tablet 0 Past Week at Unknown time  . oxyCODONE-acetaminophen (PERCOCET/ROXICET) 5-325 MG tablet Take 1 tablet by mouth every 8 (eight) hours as needed for severe pain. 65 tablet 0 06/03/2018 at Unknown time  . [START ON 06/07/2018] oxyCODONE-acetaminophen (PERCOCET/ROXICET) 5-325 MG tablet Take 1 tablet by mouth every 8 (eight) hours as needed for  severe pain. 65 tablet 0 06/03/2018 at Unknown time  . [START ON 07/07/2018] oxyCODONE-acetaminophen (PERCOCET/ROXICET) 5-325 MG tablet Take 1 tablet by mouth every 8 (eight) hours as needed for severe pain. 65 tablet 0 06/03/2018 at Unknown time  . OXYGEN Inhale 2 mLs into the lungs at bedtime.   06/03/2018 at Unknown time  . pantoprazole (PROTONIX) 40 MG tablet Take 40 mg by mouth 2 (two) times daily.   06/03/2018 at Unknown time  . Polyvinyl Alcohol-Povidone (REFRESH OP) Place 2 drops into both eyes daily as needed (dry eyes).    Past Week at Unknown time  . predniSONE  (DELTASONE) 5 MG tablet Take 5 mg by mouth daily.    06/03/2018 at Unknown time  . promethazine (PHENERGAN) 12.5 MG tablet Take 1 tablet (12.5 mg total) by mouth every 8 (eight) hours as needed for nausea or vomiting. 20 tablet 5 06/03/2018 at Unknown time  . Saccharomyces boulardii (PROBIOTIC) 250 MG CAPS Take 1 tablet by mouth daily. 14 capsule  Past Week at Unknown time  . sucralfate (CARAFATE) 1 GM/10ML suspension Take 1 g by mouth 4 (four) times daily. Before meals and nightly   06/03/2018 at Unknown time  . SYNTHROID 150 MCG tablet TAKE 1 TABLET BY MOUTH EVERY DAY 30 tablet 5 06/03/2018 at Unknown time  . Syringe/Needle, Disp, (SYRINGE 3CC/25GX1") 25G X 1" 3 ML MISC Use for b12 injections 50 each 0 Past Week at Unknown time  . Tadalafil, PAH, (ADCIRCA) 20 MG TABS Take 40 mg by mouth daily.   Past Week at Unknown time  . warfarin (COUMADIN) 3 MG tablet Take 1 tablet (3 mg total) by mouth daily. Alternating with 4 mg 30 tablet 2 Past Week at Unknown time  . warfarin (COUMADIN) 4 MG tablet TAKE 1 TABLET BY MOUTH EVERY DAY 30 tablet 2 Past Week at Unknown time  . Lactulose 20 GM/30ML SOLN 30 ml every 4 hours until constipation is relieved (Patient not taking: Reported on 05/08/2018) 236 mL 3 Not Taking at Unknown time     Allergies  Allergen Reactions  . Iodinated Diagnostic Agents Hives    Pt states she broke out in hives years ago, has been premedicated in the past.   . Tramadol Other (See Comments) and Rash    Rash  . Amoxicillin Other (See Comments)    Reaction:  Stomach cramps   . Cefuroxime Itching  . Metrizamide Hives  . Morphine And Related     Reaction: face flushed    . Propoxyphene     Stomach cramp  . Cephalexin Rash  . Sulfa Antibiotics Rash    REACTION: Rash  . Sulfonamide Derivatives Rash  . Tramadol Hcl Rash  . Venlafaxine Rash     Past Medical History:  Diagnosis Date  . Acute glomerulonephritis with other specified pathological lesion in kidney in disease  classified elsewhere(580.81)   . Alveolar aeration decreased   . Anemia   . Arthritis   . Atrial fibrillation ([place])   . Breast cancer ([place]) 06/03/2014   positive, radiation  . Cancer ([facility])    Breast  . CHF (congestive heart failure) ([place])   . Diaphragmatic hernia   . DVT (deep venous thrombosis) ([place])   . Dysrhythmia   . Environmental allergies   . Esophageal reflux   . Feeding problem    FEEDING TUBE  . Hiatal hernia   . Hyperlipidemia   . Hypertension   . Lower extremity edema   . Lupus (systemic lupus  erythematosus) ([place])   . Neuropathy   . Osteopenia   . Oxygen deficiency    USES HS  . Peripheral neuropathy, hereditary/idiopathic   . Personal history of radiation therapy   . Pneumonia   . Primary pulmonary hypertension ([place])   . Pulmonary hypertension ([place])   . Renal insufficiency   . Sciatica   . Shingles   . Shingles (herpes zoster) polyneuropathy March 2013   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  . Shortness of breath dyspnea   . Unspecified hypothyroidism   . Unspecified menopausal and postmenopausal disorder   . Urinary incontinence without sensory awareness   . Vitamin B deficiency     Review of systems:      Physical Exam    Heart and lungs: Regular rate and rhythm without rub or gallop, lungs are bilaterally clear    HEENT: Normocephalic atraumatic eyes are anicteric    Other:    Pertinant exam for procedure: Soft, mild tenderness to palpation left lower quadrant extending upward to the umbilical line.  There are no masses or rebound.  Sounds are positive normoactive    Planned proceedures: Colonoscopy and indicated procedures. I have discussed the risks benefits and complications of procedures to include not limited to bleeding, infection, perforation and the risk of sedation and the patient wishes to proceed.    Lollie Sails, MD Gastroenterology 06/04/2018  11:28 AM

## 2018-06-05 ENCOUNTER — Encounter: Payer: Self-pay | Admitting: Gastroenterology

## 2018-06-07 ENCOUNTER — Telehealth: Payer: Self-pay | Admitting: Internal Medicine

## 2018-06-07 LAB — SURGICAL PATHOLOGY

## 2018-06-07 NOTE — Telephone Encounter (Signed)
Called pharmacy and med is due to be refilled today- pharmacy to fill. Pt called and updated

## 2018-06-07 NOTE — Telephone Encounter (Signed)
Copied from [place] [id number]. Topic: Quick Communication - Rx Refill/Question >> Jun 07, 2018 10:25 AM Yvette Rack wrote: Medication: oxyCODONE-acetaminophen (PERCOCET/ROXICET) 5-325 MG tablet  Has the patient contacted their pharmacy? Yes.  today (Agent: If no, request that the patient contact the pharmacy for the refill.) (Agent: If yes, when and what did the pharmacy advise?) told her to call provider  Preferred Pharmacy (with phone number or street name):     [id number] [address], [place] - [place] [phone] (Phone) [phone] (Fax)    Agent: Please be advised that RX refills may take up to 3 business days. We ask that you follow-up with your pharmacy.

## 2018-06-08 DIAGNOSIS — D126 Benign neoplasm of colon, unspecified: Secondary | ICD-10-CM

## 2018-06-11 DIAGNOSIS — D045 Carcinoma in situ of skin of trunk: Secondary | ICD-10-CM | POA: Diagnosis not present

## 2018-06-11 DIAGNOSIS — D0471 Carcinoma in situ of skin of right lower limb, including hip: Secondary | ICD-10-CM | POA: Diagnosis not present

## 2018-06-12 ENCOUNTER — Telehealth: Payer: Self-pay | Admitting: Oncology

## 2018-06-12 DIAGNOSIS — M4186 Other forms of scoliosis, lumbar region: Secondary | ICD-10-CM | POA: Diagnosis not present

## 2018-06-12 DIAGNOSIS — M545 Low back pain: Secondary | ICD-10-CM | POA: Diagnosis not present

## 2018-06-12 NOTE — Telephone Encounter (Signed)
Labs/MD in 6 mths, per 12/19/17 los. Updated appts conf w patient and also mailed.

## 2018-06-18 ENCOUNTER — Telehealth: Payer: Self-pay | Admitting: Internal Medicine

## 2018-06-18 DIAGNOSIS — Z79899 Other long term (current) drug therapy: Secondary | ICD-10-CM

## 2018-06-18 NOTE — Telephone Encounter (Signed)
FYI

## 2018-06-18 NOTE — Telephone Encounter (Signed)
Copied from [place] [phone]. Topic: Inquiry >> Jun 18, 2018  4:17 PM Margot Ables wrote: Pt called to schedule INR check and states that Dr. Keturah Barre at [facility] advised her to have repeat kidney function testing. Pt is scheduled for INR check 06/20/18. Please advise.

## 2018-06-18 NOTE — Telephone Encounter (Signed)
Is it okay to check kidney function when pt comes in for INR blood draw?

## 2018-06-18 NOTE — Telephone Encounter (Signed)
bmet ordered 

## 2018-06-19 ENCOUNTER — Ambulatory Visit: Payer: Medicare Other | Admitting: Oncology

## 2018-06-19 ENCOUNTER — Other Ambulatory Visit: Payer: Self-pay | Admitting: Radiology

## 2018-06-19 ENCOUNTER — Other Ambulatory Visit: Payer: Medicare Other

## 2018-06-19 DIAGNOSIS — Z5181 Encounter for therapeutic drug level monitoring: Secondary | ICD-10-CM

## 2018-06-19 DIAGNOSIS — Z7901 Long term (current) use of anticoagulants: Principal | ICD-10-CM

## 2018-06-20 ENCOUNTER — Other Ambulatory Visit (INDEPENDENT_AMBULATORY_CARE_PROVIDER_SITE_OTHER): Payer: Medicare Other

## 2018-06-20 DIAGNOSIS — Z5181 Encounter for therapeutic drug level monitoring: Secondary | ICD-10-CM

## 2018-06-20 DIAGNOSIS — Z79899 Other long term (current) drug therapy: Secondary | ICD-10-CM

## 2018-06-20 DIAGNOSIS — Z7901 Long term (current) use of anticoagulants: Secondary | ICD-10-CM | POA: Diagnosis not present

## 2018-06-20 LAB — BASIC METABOLIC PANEL
BUN: 23 mg/dL (ref 6–23)
CALCIUM: 8.8 mg/dL (ref 8.4–10.5)
CO2: 21 mEq/L (ref 19–32)
CREATININE: 1.49 mg/dL — AB (ref 0.40–1.20)
Chloride: 108 mEq/L (ref 96–112)
GFR: 35.65 mL/min — AB (ref >60.00)
Glucose, Bld: 250 mg/dL — ABNORMAL HIGH (ref 70–99)
Potassium: 3.9 mEq/L (ref 3.5–5.1)
SODIUM: 140 meq/L (ref 135–145)

## 2018-06-20 LAB — PROTIME-INR
INR: 3.6 ratio — AB (ref 0.8–1.0)
Prothrombin Time: 41.5 s — ABNORMAL HIGH (ref 9.6–13.1)

## 2018-06-20 NOTE — Telephone Encounter (Signed)
Pt came in today for lab work. 

## 2018-06-21 ENCOUNTER — Other Ambulatory Visit: Payer: Self-pay | Admitting: Internal Medicine

## 2018-06-21 MED ORDER — WARFARIN SODIUM 3 MG PO TABS: 3.0000 mg | ORAL_TABLET | Freq: Every day | ORAL | 2 refills | Status: DC

## 2018-06-27 ENCOUNTER — Other Ambulatory Visit: Payer: Self-pay | Admitting: *Deleted

## 2018-06-27 ENCOUNTER — Other Ambulatory Visit: Payer: Self-pay | Admitting: Internal Medicine

## 2018-06-27 DIAGNOSIS — D126 Benign neoplasm of colon, unspecified: Secondary | ICD-10-CM | POA: Diagnosis not present

## 2018-06-27 DIAGNOSIS — C50412 Malignant neoplasm of upper-outer quadrant of left female breast: Secondary | ICD-10-CM

## 2018-06-27 DIAGNOSIS — K5792 Diverticulitis of intestine, part unspecified, without perforation or abscess without bleeding: Secondary | ICD-10-CM | POA: Diagnosis not present

## 2018-06-27 DIAGNOSIS — D539 Nutritional anemia, unspecified: Secondary | ICD-10-CM

## 2018-06-28 ENCOUNTER — Encounter: Payer: Self-pay | Admitting: Oncology

## 2018-06-28 ENCOUNTER — Inpatient Hospital Stay: Payer: Medicare Other | Attending: Oncology

## 2018-06-28 ENCOUNTER — Other Ambulatory Visit: Payer: Self-pay

## 2018-06-28 ENCOUNTER — Inpatient Hospital Stay (HOSPITAL_BASED_OUTPATIENT_CLINIC_OR_DEPARTMENT_OTHER): Payer: Medicare Other | Admitting: Oncology

## 2018-06-28 VITALS — BP 114/75 | HR 98 | Temp 98.1°F | Resp 18 | Wt 121.2 lb

## 2018-06-28 DIAGNOSIS — Z853 Personal history of malignant neoplasm of breast: Principal | ICD-10-CM

## 2018-06-28 DIAGNOSIS — Z08 Encounter for follow-up examination after completed treatment for malignant neoplasm: Secondary | ICD-10-CM

## 2018-06-28 DIAGNOSIS — Z170 Estrogen receptor positive status [ER+]: Secondary | ICD-10-CM | POA: Diagnosis not present

## 2018-06-28 DIAGNOSIS — Z86718 Personal history of other venous thrombosis and embolism: Secondary | ICD-10-CM | POA: Insufficient documentation

## 2018-06-28 DIAGNOSIS — Z923 Personal history of irradiation: Secondary | ICD-10-CM | POA: Insufficient documentation

## 2018-06-28 DIAGNOSIS — D631 Anemia in chronic kidney disease: Secondary | ICD-10-CM | POA: Diagnosis not present

## 2018-06-28 DIAGNOSIS — Z5181 Encounter for therapeutic drug level monitoring: Secondary | ICD-10-CM

## 2018-06-28 DIAGNOSIS — Z87891 Personal history of nicotine dependence: Secondary | ICD-10-CM

## 2018-06-28 DIAGNOSIS — C50412 Malignant neoplasm of upper-outer quadrant of left female breast: Secondary | ICD-10-CM

## 2018-06-28 DIAGNOSIS — N189 Chronic kidney disease, unspecified: Secondary | ICD-10-CM

## 2018-06-28 DIAGNOSIS — Z79811 Long term (current) use of aromatase inhibitors: Secondary | ICD-10-CM

## 2018-06-28 DIAGNOSIS — Z79899 Other long term (current) drug therapy: Secondary | ICD-10-CM | POA: Insufficient documentation

## 2018-06-28 DIAGNOSIS — M1990 Unspecified osteoarthritis, unspecified site: Secondary | ICD-10-CM

## 2018-06-28 DIAGNOSIS — Z7901 Long term (current) use of anticoagulants: Secondary | ICD-10-CM

## 2018-06-28 DIAGNOSIS — D539 Nutritional anemia, unspecified: Secondary | ICD-10-CM

## 2018-06-28 DIAGNOSIS — M810 Age-related osteoporosis without current pathological fracture: Secondary | ICD-10-CM

## 2018-06-28 LAB — COMPREHENSIVE METABOLIC PANEL
ALBUMIN: 3.3 g/dL — AB (ref 3.5–5.0)
ALK PHOS: 72 U/L (ref 38–126)
ALT: 10 U/L (ref 0–44)
ANION GAP: 11 (ref 5–15)
AST: 21 U/L (ref 15–41)
BILIRUBIN TOTAL: 0.4 mg/dL (ref 0.3–1.2)
BUN: 37 mg/dL — ABNORMAL HIGH (ref 8–23)
CALCIUM: 9 mg/dL (ref 8.9–10.3)
CO2: 20 mmol/L — AB (ref 22–32)
Chloride: 108 mmol/L (ref 98–111)
Creatinine, Ser: 1.71 mg/dL — ABNORMAL HIGH (ref 0.44–1.00)
GFR calc non Af Amer: 27 mL/min — ABNORMAL LOW (ref >60)
GFR, EST AFRICAN AMERICAN: 31 mL/min — AB (ref >60)
Glucose, Bld: 127 mg/dL — ABNORMAL HIGH (ref 70–99)
POTASSIUM: 4.3 mmol/L (ref 3.5–5.1)
SODIUM: 139 mmol/L (ref 135–145)
TOTAL PROTEIN: 6.9 g/dL (ref 6.5–8.1)

## 2018-06-28 LAB — CBC WITH DIFFERENTIAL/PLATELET
Abs Immature Granulocytes: 0.01 10*3/uL (ref 0.00–0.07)
Basophils Absolute: 0.1 10*3/uL (ref 0.0–0.1)
Basophils Relative: 1 %
EOS PCT: 3 %
Eosinophils Absolute: 0.2 10*3/uL (ref 0.0–0.5)
HCT: 30.5 % — ABNORMAL LOW (ref 36.0–46.0)
Hemoglobin: 9.3 g/dL — ABNORMAL LOW (ref 12.0–15.0)
Immature Granulocytes: 0 %
Lymphocytes Relative: 12 %
Lymphs Abs: 0.7 10*3/uL (ref 0.7–4.0)
MCH: 30 pg (ref 26.0–34.0)
MCHC: 30.5 g/dL (ref 30.0–36.0)
MCV: 98.4 fL (ref 80.0–100.0)
MONOS PCT: 10 %
Monocytes Absolute: 0.6 10*3/uL (ref 0.1–1.0)
NEUTROS ABS: 4.3 10*3/uL (ref 1.7–7.7)
NRBC: 0.5 % — AB (ref 0.0–0.2)
Neutrophils Relative %: 74 %
Platelets: 445 10*3/uL — ABNORMAL HIGH (ref 150–400)
RBC: 3.1 MIL/uL — ABNORMAL LOW (ref 3.87–5.11)
RDW: 15.3 % (ref 11.5–15.5)
WBC: 5.9 10*3/uL (ref 4.0–10.5)

## 2018-06-28 LAB — VITAMIN B12: Vitamin B-12: 367 pg/mL (ref 180–914)

## 2018-06-28 LAB — FOLATE: Folate: 35 ng/mL (ref >5.9)

## 2018-06-28 LAB — FERRITIN: Ferritin: 53 ng/mL (ref 11–307)

## 2018-06-28 NOTE — Progress Notes (Signed)
Pt in for 6 month follow up, denies any concerns today. 

## 2018-06-29 NOTE — Progress Notes (Signed)
Hematology/Oncology Consult note [facility]  [phone] [phone]  Patient Care Team: Sherlene Shams, MD as PCP - General (Internal Medicine) Lemar Livings Merrily Pew, MD (General Surgery) Darci Current, MD as Attending Physician (Emergency Medicine)   Name of the patient: Diana [name]  [id number]  1935/07/29   Date of visit: 06/29/18  Diagnosis- 1. Macrocytic anemia likely secondary to kidney disease and or underlying MDS 2. History of ER/PR positive stage I left breast cancer currently on hormone therapy with letrozole  Chief complaint/ Reason for visit-routine follow-up of breast cancer and anemia  Heme/Onc history:  Oncology History   83 year old female with stage I (T1 C. N0 M0) invasive mammary carcinoma status post wide local excision and sentinel node biopsy tumor is ER/PR positive HER-2/neu not over expressed with a low Oncotype DX score of 17 for adjuvant whole breast radiation Oncotype DX score is 17 Patient has finished radiation therapy in December of 2015 Started letrozole from December, 2015     Breast cancer of upper-outer quadrant of left female breast (HCC)   05/26/2014 Initial Diagnosis    Breast cancer of upper-outer quadrant of left female breast (HCC), stage I, T1 cN0 M0, ER/PR positive HER-2/neu not overexpressed.    Mar 28, 202015 Oncotype testing    Oncotype DX score 17     - 09/05/2014 Radiation Therapy       09/05/2014 -  Anti-estrogen oral therapy    Started letrozole      Interval history-patient reports doing well from breast cancer standpoint.  However she has had a couple of episodes of diverticulitis earlier this year.  She still has occasional abdominal pain.  Appetite is fair.  She denies any unintentional weight loss.  Denies any falls.  She is on letrozole and is tolerating it well without any significant side effects  ECOG PS- 2 Pain scale- 0 Opioid associated constipation- no  Review of  systems- Review of Systems  Constitutional: Positive for malaise/fatigue. Negative for chills, fever and weight loss.  HENT: Negative for congestion, ear discharge and nosebleeds.   Eyes: Negative for blurred vision.  Respiratory: Negative for cough, hemoptysis, sputum production, shortness of breath and wheezing.   Cardiovascular: Negative for chest pain, palpitations, orthopnea and claudication.  Gastrointestinal: Positive for abdominal pain. Negative for blood in stool, constipation, diarrhea, heartburn, melena, nausea and vomiting.  Genitourinary: Negative for dysuria, flank pain, frequency, hematuria and urgency.  Musculoskeletal: Negative for back pain, joint pain and myalgias.  Skin: Negative for rash.  Neurological: Negative for dizziness, tingling, focal weakness, seizures, weakness and headaches.  Endo/Heme/Allergies: Does not bruise/bleed easily.  Psychiatric/Behavioral: Negative for depression and suicidal ideas. The patient does not have insomnia.       Allergies  Allergen Reactions  . Iodinated Diagnostic Agents Hives    Pt states she broke out in hives years ago, has been premedicated in the past.   . Tramadol Other (See Comments) and Rash    Rash  . Amoxicillin Other (See Comments)    Reaction:  Stomach cramps   . Cefuroxime Itching  . Metrizamide Hives  . Morphine And Related     Reaction: face flushed    . Propoxyphene     Stomach cramp  . Cephalexin Rash  . Sulfa Antibiotics Rash    REACTION: Rash  . Sulfonamide Derivatives Rash  . Tramadol Hcl Rash  . Venlafaxine Rash     Past Medical History:  Diagnosis Date  . Acute glomerulonephritis with  other specified pathological lesion in kidney in disease classified elsewhere(580.81)   . Alveolar aeration decreased   . Anemia   . Arthritis   . Atrial fibrillation (HCC)   . Breast cancer (HCC) 06/03/2014   positive, radiation  . Cancer (HCC)    Breast  . CHF (congestive heart failure) (HCC)   .  Diaphragmatic hernia   . DVT (deep venous thrombosis) (HCC)   . Dysrhythmia   . Environmental allergies   . Esophageal reflux   . Feeding problem    FEEDING TUBE  . Hiatal hernia   . Hyperlipidemia   . Hypertension   . Lower extremity edema   . Lupus (systemic lupus erythematosus) (HCC)   . Neuropathy   . Osteopenia   . Oxygen deficiency    USES HS  . Peripheral neuropathy, hereditary/idiopathic   . Personal history of radiation therapy   . Pneumonia   . Primary pulmonary hypertension (HCC)   . Pulmonary hypertension (HCC)   . Renal insufficiency   . Sciatica   . Shingles   . Shingles (herpes zoster) polyneuropathy March 2013   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  . Shortness of breath dyspnea   . Unspecified hypothyroidism   . Unspecified menopausal and postmenopausal disorder   . Urinary incontinence without sensory awareness   . Vitamin B deficiency      Past Surgical History:  Procedure Laterality Date  . BREAST BIOPSY Left 2015   invasive mammary carcinoma  . BREAST LUMPECTOMY Left 2015   invasive mammary carcinoma clear margins but close  . BREAST SURGERY    . CATARACT EXTRACTION W/PHACO Right 04/05/2016   Procedure: CATARACT EXTRACTION PHACO AND INTRAOCULAR LENS PLACEMENT (IOC);  Surgeon: Galen Manila, MD;  Location: [facility] ORS;  Service: Ophthalmology;  Laterality: Right;  [place] 01:20 AP% 23.9 CDE 19.29 fluid pack lot # [id number]  . CATARACT EXTRACTION W/PHACO Left 04/26/2016   Procedure: CATARACT EXTRACTION PHACO AND INTRAOCULAR LENS PLACEMENT (IOC);  Surgeon: Galen Manila, MD;  Location: [facility] ORS;  Service: Ophthalmology;  Laterality: Left;   [place] 00:59 AP% 23.2 CDE 13.83 Fluid pack lot # [id number]  . COLONOSCOPY WITH PROPOFOL N/A 06/04/2018   Procedure: COLONOSCOPY WITH PROPOFOL;  Surgeon: Christena Deem, MD;  Location: [facility] ENDOSCOPY;  Service: Endoscopy;  Laterality: N/A;  . DIAGNOSTIC LAPAROSCOPY    . ESOPHAGOGASTRODUODENOSCOPY N/A 07/19/2015    Procedure: ESOPHAGOGASTRODUODENOSCOPY (EGD);  Surgeon: Wallace Cullens, MD;  Location: [facility] ENDOSCOPY;  Service: Gastroenterology;  Laterality: N/A;  . ESOPHAGOGASTRODUODENOSCOPY (EGD) WITH PROPOFOL N/A 03/28/2015   Procedure: ESOPHAGOGASTRODUODENOSCOPY (EGD) WITH PROPOFOL;  Surgeon: Earline Mayotte, MD;  Location: [facility] ENDOSCOPY;  Service: Endoscopy;  Laterality: N/A;  . ESOPHAGOGASTRODUODENOSCOPY (EGD) WITH PROPOFOL N/A 06/10/2015   Procedure: ESOPHAGOGASTRODUODENOSCOPY (EGD) WITH PROPOFOL;  Surgeon: Christena Deem, MD;  Location: [facility] ENDOSCOPY;  Service: Endoscopy;  Laterality: N/A;  . ESOPHAGOGASTRODUODENOSCOPY (EGD) WITH PROPOFOL N/A 10/20/2015   Procedure: ESOPHAGOGASTRODUODENOSCOPY (EGD) WITH PROPOFOL;  Surgeon: Christena Deem, MD;  Location: [facility] ENDOSCOPY;  Service: Endoscopy;  Laterality: N/A;  . ESOPHAGOGASTRODUODENOSCOPY (EGD) WITH PROPOFOL N/A 02/23/2016   Procedure: ESOPHAGOGASTRODUODENOSCOPY (EGD) WITH PROPOFOL;  Surgeon: Christena Deem, MD;  Location: [facility] ENDOSCOPY;  Service: Endoscopy;  Laterality: N/A;  . ESOPHAGOGASTRODUODENOSCOPY (EGD) WITH PROPOFOL N/A 06/30/2016   Procedure: ESOPHAGOGASTRODUODENOSCOPY (EGD) WITH PROPOFOL;  Surgeon: Christena Deem, MD;  Location: [facility] ENDOSCOPY;  Service: Endoscopy;  Laterality: N/A;  . ESOPHAGOGASTRODUODENOSCOPY (EGD) WITH PROPOFOL N/A 01/05/2017   Procedure: ESOPHAGOGASTRODUODENOSCOPY (EGD) WITH PROPOFOL;  Surgeon: Cindra Eves  Marva Panda, MD;  Location: [facility] ENDOSCOPY;  Service: Endoscopy;  Laterality: N/A;  . KYPHOPLASTY N/A 02/10/2015   Procedure: [id number]/K4;  Surgeon: Kennedy Bucker, MD;  Location: [facility] ORS;  Service: Orthopedics;  Laterality: N/A;  . LAPAROTOMY N/A 03/11/2015   Procedure: REDUCTION OF GASTRIC VOLVULUS, SPLEENECTOMY, GASTRIC TUBE PLACEMENT;  Surgeon: Earline Mayotte, MD;  Location: [facility] ORS;  Service: General;  Laterality: N/A;  . MOHS SURGERY    . PERIPHERAL VASCULAR CATHETERIZATION N/A 06/12/2015   Procedure: IVC Filter  Insertion;  Surgeon: Annice Needy, MD;  Location: [facility] INVASIVE CV LAB;  Service: Cardiovascular;  Laterality: N/A;  . PERIPHERAL VASCULAR CATHETERIZATION N/A 08/23/2016   Procedure: IVC Filter Removal;  Surgeon: Renford Dills, MD;  Location: [facility] INVASIVE CV LAB;  Service: Cardiovascular;  Laterality: N/A;  . STOMACH SURGERY     for volvolus    Social History   Socioeconomic History  . Marital status: Divorced    Spouse name: Not on file  . Number of children: Not on file  . Years of education: Not on file  . Highest education level: Not on file  Occupational History  . 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 Financial resource strain: Not hard at all  . Food insecurity:    Worry: Never true    Inability: Never true  . Transportation needs:    Medical: No    Non-medical: No  Tobacco Use  . Smoking status: Former Smoker    Last attempt to quit: 05/25/1991    Years since quitting: 27.1  . Smokeless tobacco: Never Used  Substance and Sexual Activity  . Alcohol use: No    Frequency: Never    Comment: rare  . Drug use: No  . Sexual activity: Not on file  Lifestyle  . Physical activity:    Days per week: 0 days    Minutes per session: 0 min  . Stress: Not at all  Relationships  . Social connections:    Talks on phone: Patient refused    Gets together: Patient refused    Attends religious service: Patient refused    Active member of club or organization: Patient refused    Attends meetings of clubs or organizations: Patient refused    Relationship status: Patient refused  . Intimate partner violence:    Fear of current or ex partner: No    Emotionally abused: No    Physically abused: No    Forced sexual activity: No  Other Topics Concern  . Not on file  Social History Narrative  . Not on file    Family History  Problem Relation Age of Onset  . Colon cancer Mother   . Cirrhosis Brother        alcoholic  . Alcohol  abuse Sister   . Breast cancer Sister 27  . Cancer Brother   . Lung cancer Son   . Cirrhosis Brother        nonalcoholic      Current Outpatient Medications:  .  Ascorbic Acid (VITAMIN C) 1000 MG tablet, Take 1,000 mg by mouth daily., Disp: , Rfl:  .  Calcium Carbonate-Vitamin D 600-400 MG-UNIT tablet, Take 1 tablet by mouth daily., Disp: , Rfl:  .  cyanocobalamin (,VITAMIN B-12,) 1000 MCG/ML injection, Inject 1 mL (1,000 mcg total) into the muscle every 30 (thirty) days. Pt uses on the 1st of every month., Disp: 10 mL, Rfl: 1 .  hydroxychloroquine (PLAQUENIL)  200 MG tablet, Take 200 mg by mouth daily., Disp: , Rfl:  .  letrozole (FEMARA) 2.5 MG tablet, Take 1 tablet (2.5 mg total) by mouth daily., Disp: 30 tablet, Rfl: 9 .  lisinopril (PRINIVIL,ZESTRIL) 5 MG tablet, TAKE 1 TABLET (5 MG TOTAL) BY MOUTH DAILY., Disp: 90 tablet, Rfl: 3 .  metoprolol succinate (TOPROL-XL) 25 MG 24 hr tablet, Take 12.5 mg by mouth daily. , Disp: , Rfl:  .  mycophenolate (CELLCEPT) 500 MG tablet, Take 1,000 mg by mouth 2 (two) times daily., Disp: , Rfl:  .  ondansetron (ZOFRAN ODT) 4 MG disintegrating tablet, Take 1 tablet (4 mg total) by mouth every 8 (eight) hours as needed for nausea or vomiting., Disp: 20 tablet, Rfl: 0 .  oxyCODONE-acetaminophen (PERCOCET/ROXICET) 5-325 MG tablet, Take 1 tablet by mouth every 8 (eight) hours as needed for severe pain., Disp: 65 tablet, Rfl: 0 .  OXYGEN, Inhale 2 mLs into the lungs at bedtime., Disp: , Rfl:  .  pantoprazole (PROTONIX) 40 MG tablet, Take 40 mg by mouth 2 (two) times daily., Disp: , Rfl:  .  predniSONE (DELTASONE) 5 MG tablet, Take 5 mg by mouth daily. , Disp: , Rfl:  .  promethazine (PHENERGAN) 12.5 MG tablet, Take 1 tablet (12.5 mg total) by mouth every 8 (eight) hours as needed for nausea or vomiting., Disp: 20 tablet, Rfl: 5 .  Saccharomyces boulardii (PROBIOTIC) 250 MG CAPS, Take 1 tablet by mouth daily., Disp: 14 capsule, Rfl:  .  sucralfate (CARAFATE) 1  GM/10ML suspension, Take 1 g by mouth 4 (four) times daily. Before meals and nightly, Disp: , Rfl:  .  SYNTHROID 150 MCG tablet, TAKE 1 TABLET BY MOUTH EVERY DAY, Disp: 30 tablet, Rfl: 5 .  Syringe/Needle, Disp, (SYRINGE 3CC/25GX1") 25G X 1" 3 ML MISC, Use for b12 injections, Disp: 50 each, Rfl: 0 .  Tadalafil, PAH, (ADCIRCA) 20 MG TABS, Take 40 mg by mouth daily., Disp: , Rfl:  .  warfarin (COUMADIN) 3 MG tablet, Take 1 tablet (3 mg total) by mouth daily., Disp: 30 tablet, Rfl: 2 .  furosemide (LASIX) 20 MG tablet, 1 tablet daily as needed for fluid retention (Patient not taking: Reported on 06/28/2018), Disp: 30 tablet, Rfl: 3 .  Lactulose 20 GM/30ML SOLN, 30 ml every 4 hours until constipation is relieved (Patient not taking: Reported on 05/08/2018), Disp: 236 mL, Rfl: 3 .  metoCLOPramide (REGLAN) 10 MG tablet, TAKE 1 TABLET BY MOUTH 4 TIMES DAILY - BEFORE MEALS AND AT BEDTIME., Disp: 360 tablet, Rfl: 0 .  nystatin cream (MYCOSTATIN), Apply 1 application topically 2 (two) times daily. (Patient not taking: Reported on 06/28/2018), Disp: 30 g, Rfl: 0 .  [START ON 07/07/2018] oxyCODONE-acetaminophen (PERCOCET/ROXICET) 5-325 MG tablet, Take 1 tablet by mouth every 8 (eight) hours as needed for severe pain. (Patient not taking: Reported on 06/28/2018), Disp: 65 tablet, Rfl: 0 .  Polyvinyl Alcohol-Povidone (REFRESH OP), Place 2 drops into both eyes daily as needed (dry eyes). , Disp: , Rfl:   Physical exam:  Vitals:   06/28/18 1436  BP: 114/75  Pulse: 98  Resp: 18  Temp: 98.1 F (36.7 C)  TempSrc: Tympanic  Weight: 121 lb 4 oz (55 kg)   Physical Exam  Constitutional: She is oriented to person, place, and time.  Thin frail elderly lady in no acute distress.  She has a kyphotic spine  HENT:  Head: Normocephalic and atraumatic.  Eyes: Pupils are equal, round, and reactive to light. EOM  are normal.  Neck: Normal range of motion.  Cardiovascular: Normal rate, regular rhythm and normal heart  sounds.  Pulmonary/Chest: Effort normal and breath sounds normal.  Abdominal: Soft. Bowel sounds are normal.  Neurological: She is alert and oriented to person, place, and time.  Skin: Skin is warm and dry.  Unable to perform breast exam on the examination table.  She is unable to lay flat either.  Breast exam therefore limited and performed in sitting position only.  No palpable breast masses or bilateral axillary adenopathy.  CMP Latest Ref Rng & Units 06/28/2018  Glucose 70 - 99 mg/dL 161(W)  BUN 8 - 23 mg/dL 96(E)  Creatinine 4.54 - 1.00 mg/dL 0.98(J)  Sodium 191 - 478 mmol/L 139  Potassium 3.5 - 5.1 mmol/L 4.3  Chloride 98 - 111 mmol/L 108  CO2 22 - 32 mmol/L 20(L)  Calcium 8.9 - 10.3 mg/dL 9.0  Total Protein 6.5 - 8.1 g/dL 6.9  Total Bilirubin 0.3 - 1.2 mg/dL 0.4  Alkaline Phos 38 - 126 U/L 72  AST 15 - 41 U/L 21  ALT 0 - 44 U/L 10   CBC Latest Ref Rng & Units 06/28/2018  WBC 4.0 - 10.5 K/uL 5.9  Hemoglobin 12.0 - 15.0 g/dL 2.9(F)  Hematocrit 62.1 - 46.0 % 30.5(L)  Platelets 150 - 400 K/uL 445(H)     Assessment and plan- Patient is a 83 y.o. female with following issues:  1.  Stage I breast cancer: She will continue letrozole until September 2020.  She does have baseline osteoporosis.  Recent bone density scan in May 2019 showed T score of -1.4 at the left femur and T score of -2.9 at the femoral neck which is overall stable as compared to prior.  I will continue to get her bone density next year as well.  She will continue calcium and vitamin D along with letrozole.  She is not interested in pursuing bisphosphonate therapy at this time.  2.  Normocytic anemia: Patient's hemoglobin has been stable between 9-10 over the last 1 year.  Iron studies B12 and folate are within normal limits.  She does have some CKD which I suspect is what is contributing to her anemia.  Her creatinine today was 1.7.  I have asked her to speak to her primary care doctor and been referred to  nephrology if need be.  Currently she does not meet criteria for EPO as her hemoglobin is close to 10  Repeat CBC ferritin and iron studies in 3 in 6 months and I will see her back in 6 months.  We will also order a mammogram for January 2020   Visit Diagnosis 1. Encounter for follow-up surveillance of breast cancer   2. Osteoporosis, unspecified osteoporosis type, unspecified pathological fracture presence   3. Visit for monitoring Arimidex therapy      Dr. Owens Shark, MD, MPH [facility] at [facility] [phone] 06/29/2018 1:38 PM

## 2018-07-04 ENCOUNTER — Other Ambulatory Visit (INDEPENDENT_AMBULATORY_CARE_PROVIDER_SITE_OTHER): Payer: Medicare Other

## 2018-07-04 ENCOUNTER — Other Ambulatory Visit: Payer: Self-pay | Admitting: Radiology

## 2018-07-04 DIAGNOSIS — Z5181 Encounter for therapeutic drug level monitoring: Secondary | ICD-10-CM

## 2018-07-04 DIAGNOSIS — Z7901 Long term (current) use of anticoagulants: Secondary | ICD-10-CM | POA: Diagnosis not present

## 2018-07-04 LAB — PROTIME-INR
INR: 2.3 ratio — ABNORMAL HIGH (ref 0.8–1.0)
PROTHROMBIN TIME: 26.7 s — AB (ref 9.6–13.1)

## 2018-07-05 ENCOUNTER — Other Ambulatory Visit: Payer: Medicare Other

## 2018-07-05 ENCOUNTER — Telehealth: Payer: Self-pay

## 2018-07-05 DIAGNOSIS — Z5181 Encounter for therapeutic drug level monitoring: Secondary | ICD-10-CM

## 2018-07-05 DIAGNOSIS — Z7901 Long term (current) use of anticoagulants: Principal | ICD-10-CM

## 2018-07-05 NOTE — Telephone Encounter (Signed)
Lab ordered for upcoming lab appt.

## 2018-07-11 ENCOUNTER — Other Ambulatory Visit: Payer: Self-pay | Admitting: Gastroenterology

## 2018-07-11 DIAGNOSIS — R1032 Left lower quadrant pain: Secondary | ICD-10-CM | POA: Diagnosis not present

## 2018-07-13 ENCOUNTER — Ambulatory Visit
Admission: RE | Admit: 2018-07-13 | Discharge: 2018-07-13 | Disposition: A | Discharge status: Home / Self Care | Payer: Medicare Other | Source: Ambulatory Visit | Attending: Gastroenterology | Admitting: Gastroenterology

## 2018-07-13 DIAGNOSIS — M419 Scoliosis, unspecified: Secondary | ICD-10-CM | POA: Insufficient documentation

## 2018-07-13 DIAGNOSIS — R1032 Left lower quadrant pain: Secondary | ICD-10-CM | POA: Insufficient documentation

## 2018-07-13 DIAGNOSIS — K439 Ventral hernia without obstruction or gangrene: Secondary | ICD-10-CM | POA: Diagnosis not present

## 2018-07-13 DIAGNOSIS — I700 Atherosclerosis of aorta: Secondary | ICD-10-CM | POA: Diagnosis not present

## 2018-07-13 DIAGNOSIS — K5730 Diverticulosis of large intestine without perforation or abscess without bleeding: Secondary | ICD-10-CM | POA: Diagnosis not present

## 2018-07-13 DIAGNOSIS — K449 Diaphragmatic hernia without obstruction or gangrene: Secondary | ICD-10-CM | POA: Insufficient documentation

## 2018-07-13 MED ORDER — IOHEXOL 300 MG/ML  SOLN
75.0000 mL | Freq: Once | INTRAMUSCULAR | Status: AC | PRN
  Administered 2018-07-13: 75 mL via INTRAVENOUS

## 2018-07-24 ENCOUNTER — Telehealth: Payer: Self-pay

## 2018-07-24 NOTE — Telephone Encounter (Signed)
Copied from [place] [phone]. Topic: General - Other >> Jul 24, 2018  3:27 PM Vernona Rieger wrote: Reason for CRM: Mickel Baas with [place] called and stated they have faxed three different times for a medical necessity to be filled out and office visits from 05/08/18. She sent it on 9/3, 9/18 an 10/1. She will be faxing this again but she needs this back asap. Fax number [phone]  Phone number is [place] ext [phone]

## 2018-07-27 NOTE — Telephone Encounter (Signed)
Form has been placed in red folder.  

## 2018-07-29 DIAGNOSIS — I2729 Other secondary pulmonary hypertension: Secondary | ICD-10-CM

## 2018-07-29 DIAGNOSIS — G4736 Sleep related hypoventilation in conditions classified elsewhere: Secondary | ICD-10-CM | POA: Insufficient documentation

## 2018-07-29 NOTE — Assessment & Plan Note (Signed)
She is using supplemental oxygen by nasal cannula at 2L/min for nocturnal hypoxia.  She notes improved quality of sleep and improved energy level since starting to use the oxygen.

## 2018-07-30 NOTE — Telephone Encounter (Signed)
Office note and form has been faxed to [employer].

## 2018-07-30 NOTE — Telephone Encounter (Signed)
This is the first time it has been RECEIVED.   The office note has been amended and printed and the form has been signed

## 2018-07-31 ENCOUNTER — Telehealth: Payer: Self-pay | Admitting: Radiology

## 2018-07-31 DIAGNOSIS — Z7901 Long term (current) use of anticoagulants: Principal | ICD-10-CM

## 2018-07-31 DIAGNOSIS — Z5181 Encounter for therapeutic drug level monitoring: Secondary | ICD-10-CM

## 2018-07-31 NOTE — Addendum Note (Signed)
Addended by: Crecencio Mc on: 07/31/2018 09:11 AM   Modules accepted: Orders

## 2018-07-31 NOTE — Telephone Encounter (Signed)
Pt coming in for labs tomorrow, please place future orders. Thank you.  

## 2018-07-31 NOTE — Telephone Encounter (Signed)
INR was ordered on oct 17  If it was done incorrectly let me know so we can instruct whoever ordered it.

## 2018-08-01 ENCOUNTER — Ambulatory Visit (INDEPENDENT_AMBULATORY_CARE_PROVIDER_SITE_OTHER): Payer: Medicare Other

## 2018-08-01 ENCOUNTER — Other Ambulatory Visit: Payer: Self-pay | Admitting: Internal Medicine

## 2018-08-01 ENCOUNTER — Other Ambulatory Visit (INDEPENDENT_AMBULATORY_CARE_PROVIDER_SITE_OTHER): Payer: Medicare Other

## 2018-08-01 DIAGNOSIS — Z23 Encounter for immunization: Secondary | ICD-10-CM

## 2018-08-01 DIAGNOSIS — Z5181 Encounter for therapeutic drug level monitoring: Secondary | ICD-10-CM

## 2018-08-01 DIAGNOSIS — Z7901 Long term (current) use of anticoagulants: Secondary | ICD-10-CM | POA: Diagnosis not present

## 2018-08-01 LAB — PROTIME-INR
INR: 1.7 ratio — AB (ref 0.8–1.0)
PROTHROMBIN TIME: 19.6 s — AB (ref 9.6–13.1)

## 2018-08-02 NOTE — Progress Notes (Signed)
inr

## 2018-08-02 NOTE — Addendum Note (Signed)
Addended by: Crecencio Mc on: 08/02/2018 02:16 PM   Modules accepted: Orders

## 2018-08-06 ENCOUNTER — Telehealth: Payer: Self-pay

## 2018-08-06 NOTE — Telephone Encounter (Signed)
Copied from [place] [phone]. Topic: General - Other >> Aug 06, 2018 11:11 AM Burchel, Abbi R wrote: Claiborne Billings [facility] Patient) requesting that the form for medical necessity be re-faxed.  She states they can't make out the date at the bottom, so it can't be accepted.    Fax: [phone]

## 2018-08-07 NOTE — Telephone Encounter (Signed)
Form has been refaxed with date eligible.

## 2018-08-09 ENCOUNTER — Ambulatory Visit (INDEPENDENT_AMBULATORY_CARE_PROVIDER_SITE_OTHER): Payer: Medicare Other | Admitting: Internal Medicine

## 2018-08-09 ENCOUNTER — Other Ambulatory Visit: Payer: Self-pay

## 2018-08-09 ENCOUNTER — Encounter: Payer: Self-pay | Admitting: Internal Medicine

## 2018-08-09 ENCOUNTER — Ambulatory Visit (INDEPENDENT_AMBULATORY_CARE_PROVIDER_SITE_OTHER): Payer: Medicare Other

## 2018-08-09 VITALS — BP 114/68 | HR 116 | Temp 97.9°F | Resp 15 | Ht 61.0 in | Wt 116.2 lb

## 2018-08-09 DIAGNOSIS — M25551 Pain in right hip: Secondary | ICD-10-CM | POA: Diagnosis not present

## 2018-08-09 DIAGNOSIS — D500 Iron deficiency anemia secondary to blood loss (chronic): Secondary | ICD-10-CM

## 2018-08-09 DIAGNOSIS — Z9181 History of falling: Secondary | ICD-10-CM | POA: Diagnosis not present

## 2018-08-09 DIAGNOSIS — R1030 Lower abdominal pain, unspecified: Secondary | ICD-10-CM | POA: Diagnosis not present

## 2018-08-09 DIAGNOSIS — Z5181 Encounter for therapeutic drug level monitoring: Secondary | ICD-10-CM | POA: Diagnosis not present

## 2018-08-09 DIAGNOSIS — E119 Type 2 diabetes mellitus without complications: Secondary | ICD-10-CM

## 2018-08-09 DIAGNOSIS — Z7901 Long term (current) use of anticoagulants: Secondary | ICD-10-CM

## 2018-08-09 DIAGNOSIS — M810 Age-related osteoporosis without current pathological fracture: Secondary | ICD-10-CM | POA: Diagnosis not present

## 2018-08-09 DIAGNOSIS — Z0000 Encounter for general adult medical examination without abnormal findings: Secondary | ICD-10-CM

## 2018-08-09 DIAGNOSIS — G8929 Other chronic pain: Secondary | ICD-10-CM | POA: Diagnosis not present

## 2018-08-09 DIAGNOSIS — R110 Nausea: Secondary | ICD-10-CM

## 2018-08-09 LAB — HEMOGLOBIN A1C: Hgb A1c MFr Bld: 6.4 % (ref 4.6–6.5)

## 2018-08-09 LAB — PROTIME-INR
INR: 2 ratio — ABNORMAL HIGH (ref 0.8–1.0)
Prothrombin Time: 23.1 s — ABNORMAL HIGH (ref 9.6–13.1)

## 2018-08-09 MED ORDER — MEGESTROL ACETATE 20 MG PO TABS: 20.0000 mg | ORAL_TABLET | Freq: Two times a day (BID) | ORAL | 2 refills | Status: DC

## 2018-08-09 MED ORDER — OXYCODONE-ACETAMINOPHEN 5-325 MG PO TABS: 1.0000 | ORAL_TABLET | Freq: Three times a day (TID) | ORAL | 0 refills | Status: DC | PRN

## 2018-08-09 MED ORDER — ALPRAZOLAM 0.25 MG PO TABS: 0.2500 mg | ORAL_TABLET | Freq: Two times a day (BID) | ORAL | 5 refills | Status: AC | PRN

## 2018-08-09 NOTE — Patient Instructions (Addendum)
  Diana [name] , Thank you for taking time to come for your Medicare Wellness Visit. I appreciate your ongoing commitment to your health goals. Please review the following plan we discussed and let me know if I can assist you in the future.   These are the goals we discussed: Goals      Patient Stated   . DIET - INCREASE WATER INTAKE (pt-stated)    . Increase physical activity (pt-stated)       This is a list of the screening recommended for you and due dates:  Health Maintenance  Topic Date Due  . Complete foot exam   05/03/2017  . Hemoglobin A1C  08/08/2018  . Eye exam for diabetics  11/29/2018  . Tetanus Vaccine  10/17/2019  . Flu Shot  Completed  . DEXA scan (bone density measurement)  Completed  . Pneumonia vaccines  Completed

## 2018-08-09 NOTE — Progress Notes (Addendum)
Subjective:   Diana [name] is a 83 y.o. female who presents for Medicare Annual (Subsequent) preventive examination.  Review of Systems:  No ROS.  Medicare Wellness Visit. Additional risk factors are reflected in the social history. Cardiac Risk Factors include: advanced age (>42men, >71 women);hypertension;diabetes mellitus     Objective:     Vitals: BP 114/68 (BP Location: Right Arm, Patient Position: Sitting, Cuff Size: Normal)   Pulse (!) 116   Temp 97.9 F (36.6 C) (Oral)   Resp 15   Ht 5\' 1"  (1.549 m)   Wt 116 lb 3.2 oz (52.7 kg)   SpO2 95%   BMI 21.96 kg/m   Body mass index is 21.96 kg/m.  Advanced Directives 08/09/2018 06/28/2018 06/04/2018 12/19/2017 11/29/2017 11/28/2017 08/08/2017  Does Patient Have a Medical Advance Directive? Yes Yes No No Yes Yes Yes  Type of Paramedic of [place];Living will [place];Living will - - Living will;Healthcare Power of Attorney Living will;Healthcare Power of Attorney Living will;Healthcare Power of Attorney  Does patient want to make changes to medical advance directive? No - Patient declined - - No - Patient declined No - Patient declined - No - Patient declined  Copy of [place] in Chart? No - copy requested No - copy requested - No - copy requested No - copy requested - No - copy requested  Would patient like information on creating a medical advance directive? - - - No - Patient declined - - -    Tobacco Social History   Tobacco Use  Smoking Status Former Smoker  . Last attempt to quit: 05/25/1991  . Years since quitting: 27.2  Smokeless Tobacco Never Used     Counseling given: Not Answered   Clinical Intake:  Pre-visit preparation completed: Yes        Nutritional Status: BMI of 19-24  Normal Diabetes: Yes(Followed by pcp)  How often do you need to have someone help you when you read instructions, pamphlets, or other written materials from your  doctor or pharmacy?: 1 - Never  Interpreter Needed?: No     Past Medical History:  Diagnosis Date  . Acute glomerulonephritis with other specified pathological lesion in kidney in disease classified elsewhere(580.81)   . Alveolar aeration decreased   . Anemia   . Arthritis   . Atrial fibrillation ([place])   . Breast cancer ([place]) 06/03/2014   positive, radiation  . Cancer (HCC)    Breast  . CHF (congestive heart failure) ([place])   . Diaphragmatic hernia   . DVT (deep venous thrombosis) ([place])   . Dysrhythmia   . Environmental allergies   . Esophageal reflux   . Feeding problem    FEEDING TUBE  . Hiatal hernia   . Hyperlipidemia   . Hypertension   . Lower extremity edema   . Lupus (systemic lupus erythematosus) ([place])   . Neuropathy   . Osteopenia   . Oxygen deficiency    USES HS  . Peripheral neuropathy, hereditary/idiopathic   . Personal history of radiation therapy   . Pneumonia   . Primary pulmonary hypertension ([place])   . Pulmonary hypertension ([place])   . Renal insufficiency   . Sciatica   . Shingles   . Shingles (herpes zoster) polyneuropathy March 2013   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  . Shortness of breath dyspnea   . Unspecified hypothyroidism   . Unspecified menopausal and postmenopausal disorder   . Urinary incontinence without sensory awareness   .  Vitamin B deficiency    Past Surgical History:  Procedure Laterality Date  . BREAST BIOPSY Left 2015   invasive mammary carcinoma  . BREAST LUMPECTOMY Left 2015   invasive mammary carcinoma clear margins but close  . BREAST SURGERY    . CATARACT EXTRACTION W/PHACO Right 04/05/2016   Procedure: CATARACT EXTRACTION PHACO AND INTRAOCULAR LENS PLACEMENT (IOC);  Surgeon: Birder Robson, MD;  Location: [facility] ORS;  Service: Ophthalmology;  Laterality: Right;  [place] 01:20 AP% 23.9 CDE 19.29 fluid pack lot # [id number]  . CATARACT EXTRACTION W/PHACO Left 04/26/2016   Procedure: CATARACT EXTRACTION PHACO AND INTRAOCULAR  LENS PLACEMENT ([place]);  Surgeon: Birder Robson, MD;  Location: [facility] ORS;  Service: Ophthalmology;  Laterality: Left;   [place] 00:59 AP% 23.2 CDE 13.83 Fluid pack lot # [id number]  . COLONOSCOPY WITH PROPOFOL N/A 06/04/2018   Procedure: COLONOSCOPY WITH PROPOFOL;  Surgeon: Lollie Sails, MD;  Location: [facility] ENDOSCOPY;  Service: Endoscopy;  Laterality: N/A;  . DIAGNOSTIC LAPAROSCOPY    . ESOPHAGOGASTRODUODENOSCOPY N/A 07/19/2015   Procedure: ESOPHAGOGASTRODUODENOSCOPY (EGD);  Surgeon: Hulen Luster, MD;  Location: [facility] ENDOSCOPY;  Service: Gastroenterology;  Laterality: N/A;  . ESOPHAGOGASTRODUODENOSCOPY (EGD) WITH PROPOFOL N/A 03/28/2015   Procedure: ESOPHAGOGASTRODUODENOSCOPY (EGD) WITH PROPOFOL;  Surgeon: Robert Bellow, MD;  Location: [facility] ENDOSCOPY;  Service: Endoscopy;  Laterality: N/A;  . ESOPHAGOGASTRODUODENOSCOPY (EGD) WITH PROPOFOL N/A 06/10/2015   Procedure: ESOPHAGOGASTRODUODENOSCOPY (EGD) WITH PROPOFOL;  Surgeon: Lollie Sails, MD;  Location: [facility] ENDOSCOPY;  Service: Endoscopy;  Laterality: N/A;  . ESOPHAGOGASTRODUODENOSCOPY (EGD) WITH PROPOFOL N/A 10/20/2015   Procedure: ESOPHAGOGASTRODUODENOSCOPY (EGD) WITH PROPOFOL;  Surgeon: Lollie Sails, MD;  Location: [facility] ENDOSCOPY;  Service: Endoscopy;  Laterality: N/A;  . ESOPHAGOGASTRODUODENOSCOPY (EGD) WITH PROPOFOL N/A 02/23/2016   Procedure: ESOPHAGOGASTRODUODENOSCOPY (EGD) WITH PROPOFOL;  Surgeon: Lollie Sails, MD;  Location: [facility] ENDOSCOPY;  Service: Endoscopy;  Laterality: N/A;  . ESOPHAGOGASTRODUODENOSCOPY (EGD) WITH PROPOFOL N/A 06/30/2016   Procedure: ESOPHAGOGASTRODUODENOSCOPY (EGD) WITH PROPOFOL;  Surgeon: Lollie Sails, MD;  Location: [facility] ENDOSCOPY;  Service: Endoscopy;  Laterality: N/A;  . ESOPHAGOGASTRODUODENOSCOPY (EGD) WITH PROPOFOL N/A 01/05/2017   Procedure: ESOPHAGOGASTRODUODENOSCOPY (EGD) WITH PROPOFOL;  Surgeon: Lollie Sails, MD;  Location: [facility] ENDOSCOPY;  Service: Endoscopy;  Laterality: N/A;  .  KYPHOPLASTY N/A 02/10/2015   Procedure: [id number];  Surgeon: Hessie Knows, MD;  Location: [facility] ORS;  Service: Orthopedics;  Laterality: N/A;  . LAPAROTOMY N/A 03/11/2015   Procedure: REDUCTION OF GASTRIC VOLVULUS, SPLEENECTOMY, GASTRIC TUBE PLACEMENT;  Surgeon: Robert Bellow, MD;  Location: [facility] ORS;  Service: General;  Laterality: N/A;  . MOHS SURGERY    . PERIPHERAL VASCULAR CATHETERIZATION N/A 06/12/2015   Procedure: IVC Filter Insertion;  Surgeon: Algernon Huxley, MD;  Location: [place] CV LAB;  Service: Cardiovascular;  Laterality: N/A;  . PERIPHERAL VASCULAR CATHETERIZATION N/A 08/23/2016   Procedure: IVC Filter Removal;  Surgeon: Katha Cabal, MD;  Location: [place] CV LAB;  Service: Cardiovascular;  Laterality: N/A;  . STOMACH SURGERY     for volvolus   Family History  Problem Relation Age of Onset  . Colon cancer Mother   . Cirrhosis Brother        alcoholic  . Alcohol abuse Sister   . Breast cancer Sister 72  . Cancer Brother   . Lung cancer Son   . Meniere's disease Son   . Cirrhosis Brother        nonalcoholic    Social History   Socioeconomic History  . Marital  status: Divorced    Spouse name: Not on file  . Number of children: Not on file  . Years of education: Not on file  . Highest education level: Not on file  Occupational History  . 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 Financial resource strain: Not hard at all  . Food insecurity:    Worry: Never true    Inability: Never true  . Transportation needs:    Medical: No    Non-medical: No  Tobacco Use  . Smoking status: Former Smoker    Last attempt to quit: 05/25/1991    Years since quitting: 27.2  . Smokeless tobacco: Never Used  Substance and Sexual Activity  . Alcohol use: No    Frequency: Never    Comment: rare  . Drug use: No  . Sexual activity: Not on file  Lifestyle  . Physical activity:    Days per week: 0 days    Minutes per  session: 0 min  . Stress: Not at all  Relationships  . Social connections:    Talks on phone: Patient refused    Gets together: Patient refused    Attends religious service: Patient refused    Active member of club or organization: Patient refused    Attends meetings of clubs or organizations: Patient refused    Relationship status: Divorced  Other Topics Concern  . Not on file  Social History Narrative  . Not on file    Outpatient Encounter Medications as of 08/09/2018  Medication Sig  . Ascorbic Acid (VITAMIN C) 1000 MG tablet Take 1,000 mg by mouth daily.  . Calcium Carbonate-Vitamin D 600-400 MG-UNIT tablet Take 1 tablet by mouth daily.  . cyanocobalamin (,VITAMIN B-12,) 1000 MCG/ML injection Inject 1 mL (1,000 mcg total) into the muscle every 30 (thirty) days. Pt uses on the 1st of every month.  . furosemide (LASIX) 20 MG tablet 1 tablet daily as needed for fluid retention (Patient not taking: Reported on 08/09/2018)  . hydroxychloroquine (PLAQUENIL) 200 MG tablet Take 200 mg by mouth daily.  . Lactulose 20 GM/30ML SOLN 30 ml every 4 hours until constipation is relieved (Patient not taking: Reported on 05/08/2018)  . letrozole (FEMARA) 2.5 MG tablet Take 1 tablet (2.5 mg total) by mouth daily.  Marland Kitchen lisinopril (PRINIVIL,ZESTRIL) 5 MG tablet TAKE 1 TABLET (5 MG TOTAL) BY MOUTH DAILY.  Marland Kitchen metoCLOPramide (REGLAN) 10 MG tablet TAKE 1 TABLET BY MOUTH 4 TIMES DAILY - BEFORE MEALS AND AT BEDTIME.  . metoprolol succinate (TOPROL-XL) 25 MG 24 hr tablet Take 12.5 mg by mouth daily.   . mycophenolate (CELLCEPT) 500 MG tablet Take 1,000 mg by mouth 2 (two) times daily.  . ondansetron (ZOFRAN ODT) 4 MG disintegrating tablet Take 1 tablet (4 mg total) by mouth every 8 (eight) hours as needed for nausea or vomiting.  . OXYGEN Inhale 2 mLs into the lungs at bedtime.  . pantoprazole (PROTONIX) 40 MG tablet Take 40 mg by mouth 2 (two) times daily.  . Polyvinyl Alcohol-Povidone (REFRESH OP) Place 2  drops into both eyes daily as needed (dry eyes).   . predniSONE (DELTASONE) 5 MG tablet Take 5 mg by mouth daily.   . promethazine (PHENERGAN) 12.5 MG tablet Take 1 tablet (12.5 mg total) by mouth every 8 (eight) hours as needed for nausea or vomiting.  . Saccharomyces boulardii (PROBIOTIC) 250 MG CAPS Take 1 tablet by mouth daily.  . sucralfate (CARAFATE) 1  GM/10ML suspension Take 1 g by mouth 4 (four) times daily. Before meals and nightly  . SYNTHROID 150 MCG tablet TAKE 1 TABLET BY MOUTH EVERY DAY  . Syringe/Needle, Disp, (SYRINGE 3CC/25GX1") 25G X 1" 3 ML MISC Use for b12 injections  . Tadalafil, PAH, (ADCIRCA) 20 MG TABS Take 40 mg by mouth daily.  Marland Kitchen warfarin (COUMADIN) 3 MG tablet Take 1 tablet (3 mg total) by mouth daily.  . [DISCONTINUED] oxyCODONE-acetaminophen (PERCOCET/ROXICET) 5-325 MG tablet Take 1 tablet by mouth every 8 (eight) hours as needed for severe pain.  . [DISCONTINUED] oxyCODONE-acetaminophen (PERCOCET/ROXICET) 5-325 MG tablet Take 1 tablet by mouth every 8 (eight) hours as needed for severe pain.   No facility-administered encounter medications on file as of 08/09/2018.     Activities of Daily Living In your present state of health, do you have any difficulty performing the following activities: 08/09/2018 11/29/2017  Hearing? N N  Vision? N N  Difficulty concentrating or making decisions? N N  Walking or climbing stairs? Y Y  Dressing or bathing? N N  Doing errands, shopping? [place] Donning  Preparing Food and eating ? N -  Using the Toilet? N -  In the past six months, have you accidently leaked urine? N -  Do you have problems with loss of bowel control? N -  Comment Followed by GI -  Managing your Medications? N -  Managing your Finances? N -  Housekeeping or managing your Housekeeping? Y -  Comment Housekeeping staff assists -  Some recent data might be hidden    Patient Care Team: Crecencio Mc, MD as PCP - General (Internal Medicine) Bary Castilla, Forest Gleason, MD  (General Surgery) Gregor Hams, MD as Attending Physician (Emergency Medicine)    Assessment:   This is a routine wellness examination for [place].  The goal of the wellness visit is to assist the patient how to close the gaps in care and create a preventative care plan for the patient.   The roster of all physicians providing medical care to patient is listed in the Snapshot section of the chart.  Osteopenia. Taking calcium VIT D as appropriate/Osteoporosis risk reviewed.    Safety issues reviewed; She lives with her son. Smoke and carbon monoxide detectors in the home. No firearms in the home. Wears seatbelts when riding with others. No violence in the home.  They do not have excessive sun exposure.  Discussed the need for sun protection: hats, long sleeves and the use of sunscreen if there is significant sun exposure.  Patient is alert, normal appearance, oriented to person/place/and time. Correctly identified the president of the [place] and recalls of 3/3 words. Performs simple calculations and can read correct time from watch face. Displays appropriate judgement.  No new identified risk were noted.  No failures at ADL's or IADL's.    BMI- discussed the importance of a healthy diet, water intake and the benefits of aerobic exercise.   Dental- every 12 months.  Patient Concerns: None at this time. Follow up with PCP as needed.  Exercise Activities and Dietary recommendations Current Exercise Habits: The patient does not participate in regular exercise at present  Goals      Patient Stated   . DIET - INCREASE WATER INTAKE (pt-stated)    . Increase physical activity (pt-stated)       Fall Risk Fall Risk  08/09/2018 08/08/2017 08/08/2017 08/05/2016 05/25/2016  Falls in the past year? 1 No No No No  Comment - - - - -  Number falls in past yr: 0 - - - -  Comment She slipped while trying to balance on one foot and prop another.  - - - -  Injury with Fall? 0 - - - -    Comment She sought medical care; no breaks or fracture. - - - -  Risk for fall due to : - - - - -  Risk for fall due to: Comment - - - - -  Follow up Falls prevention discussed - - - -   Depression Screen PHQ 2/9 Scores 08/09/2018 08/08/2017 08/08/2017 08/05/2016  PHQ - 2 Score 0 0 0 0  PHQ- 9 Score - 0 - -  Exception Documentation - - - -     Cognitive Function     6CIT Screen 08/09/2018 08/08/2017 08/05/2016  What Year? 0 points 0 points 0 points  What month? 0 points 0 points 0 points  What time? 0 points 0 points 0 points  Count back from 20 0 points 0 points 0 points  Months in reverse 2 points 0 points 0 points  Repeat phrase - 0 points -  Total Score - 0 -    Immunization History  Administered Date(s) Administered  . Influenza Split 07/04/2011, 06/29/2012, 07/03/2014  . Influenza, High Dose Seasonal PF 10/16/2013, 06/15/2016, 06/30/2017, 08/01/2018  . Influenza,inj,Quad PF,6+ Mos 08/17/2015  . Influenza-Unspecified 06/15/2016  . Pneumococcal Conjugate-13 10/16/2013  . Pneumococcal Polysaccharide-23 07/16/2012  . Tdap 10/16/2009   Screening Tests Health Maintenance  Topic Date Due  . FOOT EXAM  05/03/2017  . HEMOGLOBIN A1C  08/08/2018  . OPHTHALMOLOGY EXAM  11/29/2018  . TETANUS/TDAP  10/17/2019  . INFLUENZA VACCINE  Completed  . DEXA SCAN  Completed  . PNA vac Low Risk Adult  Completed      Plan:    End of life planning; Advance aging; Advanced directives discussed. Copy of current HCPOA/Living Will requested.    I have personally reviewed and noted the following in the patient's chart:   . Medical and social history . Use of alcohol, tobacco or illicit drugs  . Current medications and supplements . Functional ability and status . Nutritional status . Physical activity . Advanced directives . List of other physicians . Hospitalizations, surgeries, and ER visits in previous 12 months . Vitals . Screenings to include cognitive, depression, and  falls . Referrals and appointments  In addition, I have reviewed and discussed with patient certain preventive protocols, quality metrics, and best practice recommendations. A written personalized care plan for preventive services as well as general preventive health recommendations were provided to patient.     OBrien-Blaney, Taheerah Guldin L, LPN  [id number]/12/[id number]     I have reviewed the above information and agree with above.   Deborra Medina, MD

## 2018-08-09 NOTE — Progress Notes (Signed)
Subjective:  Patient ID: Diana [name], female    DOB: 1934-12-23  Age: 83 y.o. MRN: [id number]  CC: The primary encounter diagnosis was Nausea. Diagnoses of Iron deficiency anemia due to chronic blood loss, Anticoagulation goal of INR 2 to 3, Type 2 diabetes mellitus without complication, without long-term current use of insulin (HCC), Chronic right hip pain, History of fall, Nausea in adult, and Age-related osteoporosis without current pathological fracture were also pertinent to this visit.  HPI CARREY VITTITOE presents for follow up on multiple issues.  Last seen 3 months ago.  She has resolving right hip pain after a mechanical fall on Sunday . The fall occurred while standing.  She  lost balance while trying to place one foot  on a chair seat  To tie her shoe.  She fell against the wall,  Then her hip struck  the carpeted floor   2) she has been having persistent nausea 24/7. Takes phenergan  bc zofran doesn't work ..seeing [facility] .  Prior intolerance to ativan .   6 lb weight loss.  No appetite ,  The thought of food makes  her nauseated   3) she has been having mild abdominal pain for the last 3 weeks,  accompanied by nausea .  She has a History  Of diverticulosis ,  Last episode of  Sigmoid "itis" was March 12,  Hospitalized for 3 days due to dehydration , the inflammation was resolved by follow up CT December 21 2017. She does not feel she is having diverticulitis    Seeing [facility] this afternoon for nausea and constipation.  3 polyps removed by recent colonoscopy   2 were tubular adenomas,  One was tubulovillous adenoma with high  grade dysplasia was noted.     Outpatient Medications Prior to Visit  Medication Sig Dispense Refill  . Ascorbic Acid (VITAMIN C) 1000 MG tablet Take 1,000 mg by mouth daily.    . Calcium Carbonate-Vitamin D 600-400 MG-UNIT tablet Take 1 tablet by mouth daily.    . cyanocobalamin (,VITAMIN B-12,) 1000 MCG/ML injection Inject 1 mL (1,000 mcg total) into  the muscle every 30 (thirty) days. Pt uses on the 1st of every month. 10 mL 1  . hydroxychloroquine (PLAQUENIL) 200 MG tablet Take 200 mg by mouth daily.    Marland Kitchen letrozole (FEMARA) 2.5 MG tablet Take 1 tablet (2.5 mg total) by mouth daily. 30 tablet 9  . lisinopril (PRINIVIL,ZESTRIL) 5 MG tablet TAKE 1 TABLET (5 MG TOTAL) BY MOUTH DAILY. 90 tablet 3  . metoCLOPramide (REGLAN) 10 MG tablet TAKE 1 TABLET BY MOUTH 4 TIMES DAILY - BEFORE MEALS AND AT BEDTIME. 360 tablet 0  . metoprolol succinate (TOPROL-XL) 25 MG 24 hr tablet Take 12.5 mg by mouth daily.     . mycophenolate (CELLCEPT) 500 MG tablet Take 1,000 mg by mouth 2 (two) times daily.    . ondansetron (ZOFRAN ODT) 4 MG disintegrating tablet Take 1 tablet (4 mg total) by mouth every 8 (eight) hours as needed for nausea or vomiting. 20 tablet 0  . OXYGEN Inhale 2 mLs into the lungs at bedtime.    . pantoprazole (PROTONIX) 40 MG tablet Take 40 mg by mouth 2 (two) times daily.    . Polyvinyl Alcohol-Povidone (REFRESH OP) Place 2 drops into both eyes daily as needed (dry eyes).     . predniSONE (DELTASONE) 5 MG tablet Take 5 mg by mouth daily.     . promethazine (PHENERGAN) 12.5 MG tablet  Take 1 tablet (12.5 mg total) by mouth every 8 (eight) hours as needed for nausea or vomiting. 20 tablet 5  . Saccharomyces boulardii (PROBIOTIC) 250 MG CAPS Take 1 tablet by mouth daily. 14 capsule   . sucralfate (CARAFATE) 1 GM/10ML suspension Take 1 g by mouth 4 (four) times daily. Before meals and nightly    . SYNTHROID 150 MCG tablet TAKE 1 TABLET BY MOUTH EVERY DAY 30 tablet 5  . Syringe/Needle, Disp, (SYRINGE 3CC/25GX1") 25G X 1" 3 ML MISC Use for b12 injections 50 each 0  . Tadalafil, PAH, (ADCIRCA) 20 MG TABS Take 40 mg by mouth daily.    Marland Kitchen warfarin (COUMADIN) 3 MG tablet Take 1 tablet (3 mg total) by mouth daily. 30 tablet 2  . oxyCODONE-acetaminophen (PERCOCET/ROXICET) 5-325 MG tablet Take 1 tablet by mouth every 8 (eight) hours as needed for severe pain.  65 tablet 0  . oxyCODONE-acetaminophen (PERCOCET/ROXICET) 5-325 MG tablet Take 1 tablet by mouth every 8 (eight) hours as needed for severe pain. 65 tablet 0  . furosemide (LASIX) 20 MG tablet 1 tablet daily as needed for fluid retention (Patient not taking: Reported on 08/09/2018) 30 tablet 3  . Lactulose 20 GM/30ML SOLN 30 ml every 4 hours until constipation is relieved (Patient not taking: Reported on 05/08/2018) 236 mL 3  . nystatin cream (MYCOSTATIN) Apply 1 application topically 2 (two) times daily. (Patient not taking: Reported on 08/09/2018) 30 g 0   No facility-administered medications prior to visit.     Review of Systems;  Patient denies headache, fevers,, skin rash, eye pain, sinus congestion and sinus pain, sore throat, dysphagia,  hemoptysis , cough, dyspnea, wheezing, chest pain, palpitations, orthopnea, edema,, melena, diarrhea, constipation, flank pain, dysuria, hematuria, urinary  Frequency, nocturia, numbness, tingling, seizures,  Focal weakness, Loss of consciousness,  Tremor, insomnia, depression, anxiety, and suicidal ideation.      Objective:  BP 114/68 (BP Location: Right Arm, Patient Position: Sitting, Cuff Size: Normal)   Pulse (!) 116   Temp 97.9 F (36.6 C) (Oral)   Resp 15   Ht 5\' 1"  (1.549 m)   Wt 116 lb 3.2 oz (52.7 kg)   SpO2 95%   BMI 21.96 kg/m   BP Readings from Last 3 Encounters:  08/09/18 114/68  08/09/18 114/68  06/28/18 114/75    Wt Readings from Last 3 Encounters:  08/09/18 116 lb 3.2 oz (52.7 kg)  08/09/18 116 lb 3.2 oz (52.7 kg)  06/28/18 121 lb 4 oz (55 kg)    General appearance: alert, cooperative and appears chronically ill,  stated age Neck: no adenopathy, no carotid bruit, supple, symmetrical, trachea midline and thyroid not enlarged, symmetric, no tenderness/mass/nodules Back: symmetric, no curvature. ROM normal. No CVA tenderness. Lungs: clear to auscultation bilaterally Heart: regular rate and rhythm, S1, S2 normal, no  murmur, click, rub or gallop Abdomen: soft, diffusely tender without guarding  bowel sounds normal; no masses,  no organomegaly Pulses: 2+ and symmetric Skin: Skin color, texture, turgor normal. No rashes or lesions Lymph nodes: Cervical, supraclavicular, and axillary nodes normal. Neuro:  awake and interactive with normal mood and affect. Higher cortical functions are normal. Speech is clear without word-finding difficulty or dysarthria. Extraocular movements are intact. Visual fields of both eyes are grossly intact. Sensation to light touch is grossly intact bilaterally of upper and lower extremities. Motor examination shows 4+/5 symmetric hand grip and upper extremity and 5/5 lower extremity strength. There is no pronation or drift. Gait is non-ataxic  Ext:  No bruising of righht hip,  ROM nchanged   u   Lab Results  Component Value Date   HGBA1C 6.4 08/09/2018   HGBA1C 6.4 02/05/2018   HGBA1C 6.2 10/18/2017    Lab Results  Component Value Date   CREATININE 1.71 (H) 06/28/2018   CREATININE 1.49 (H) 06/20/2018   CREATININE 1.44 (H) 05/08/2018    Lab Results  Component Value Date   WBC 5.9 06/28/2018   HGB 9.3 (L) 06/28/2018   HCT 30.5 (L) 06/28/2018   PLT 445 (H) 06/28/2018   GLUCOSE 127 (H) 06/28/2018   CHOL 164 10/11/2013   TRIG 152.0 (H) 10/11/2013   HDL 69.70 10/11/2013   LDLDIRECT 50.0 05/09/2016   LDLCALC 64 10/11/2013   ALT 10 06/28/2018   AST 21 06/28/2018   NA 139 06/28/2018   K 4.3 06/28/2018   CL 108 06/28/2018   CREATININE 1.71 (H) 06/28/2018   BUN 37 (H) 06/28/2018   CO2 20 (L) 06/28/2018   TSH 0.53 05/10/2018   INR 2.0 (H) 08/09/2018   HGBA1C 6.4 08/09/2018   MICROALBUR 8.7 (H) 09/14/2016    Ct Abdomen Pelvis W Contrast  Result Date: 07/13/2018 CLINICAL DATA:  83 year old with left lower quadrant pain. History of breast cancer. History of tubal ligation. Recent colonoscopy. EXAM: CT ABDOMEN AND PELVIS WITH CONTRAST TECHNIQUE: Multidetector CT  imaging of the abdomen and pelvis was performed using the standard protocol following bolus administration of intravenous contrast. CONTRAST:  75mL OMNIPAQUE IOHEXOL 300 MG/ML  SOLN COMPARISON:  CT 04/05/2018 FINDINGS: Lower chest: Again noted is a large hiatal hernia. Chronic volume loss and scarring at the lung bases. No large pleural effusions. Coronary artery calcifications. Hepatobiliary: Normal appearance of the liver, gallbladder and portal venous system. No biliary dilatation. Pancreas: Unremarkable. No pancreatic ductal dilatation or surrounding inflammatory changes. Spleen: Spleen is absent Adrenals/Urinary Tract: Normal adrenal glands. The right kidney is low lying compared to the left kidney and related to the scoliosis. Low-density structures in both kidneys are suggestive for renal cysts. No hydronephrosis. Urinary bladder is unremarkable. Stomach/Bowel: Again noted is a large hiatal hernia. Oral contrast in the stomach and small bowel without obstruction. Diffuse colonic diverticulosis without acute colonic inflammation. Colonic wall thickening in the sigmoid colon on sequence 2, image image 61 is probably related to the diverticulosis. Large amount of stool in the right colon and transverse colon. Normal appendix. Vascular/Lymphatic: Atherosclerotic calcification involving the abdominal aorta. Abdominal aorta is tortuous related to the severe scoliosis. No significant lymph node enlargement in the abdomen or pelvis. No abnormality to the IVC or iliac venous structures. Reproductive: Uterus and bilateral adnexa are unremarkable. Other: No free fluid. Negative for free air. Right upper abdominal ventral hernia just right of midline which contains fat. Musculoskeletal: Severe dextroscoliosis in the lumbar spine. Vertebral body cement at L1 and L2 IMPRESSION: 1. No acute abnormality in the abdomen or pelvis. 2. Extensive colonic diverticulosis, particularly the sigmoid colon. Occult diverticulitis is  difficult to exclude due to the extensive disease in the sigmoid colon. 3. Large hiatal hernia. 4. Small ventral hernia containing fat. 5. Lumbar spine scoliosis and prior vertebral body augmentation procedures. 6.  Aortic Atherosclerosis (ICD10-I70.0). Electronically Signed   By: Richarda Overlie M.D.   On: 07/13/2018 13:42    Assessment & Plan:   Problem List Items Addressed This Visit    Anemia, iron deficiency   Anticoagulation goal of INR 2 to 3     INR/coumadin level is therapeutic,  Continue current regimen and repeat PT/INR in one month.  Lab Results  Component Value Date   INR 2.0 (H) 08/09/2018   INR 1.7 (H) 08/01/2018   INR 2.3 (H) 07/04/2018         Relevant Orders   Protime-INR (Completed)   Chronic right hip pain    Secondary to severe scoliosis.  She was referred to Dr Hyacinth Meeker  In August for evaluation for possible AVN  given history of steroid use. Her current pain appears to be from contusions from her mechanical fall without fractures. Refill history confirmed via [place] Controlled Substance databas, accessed by me today..      Relevant Medications   oxyCODONE-acetaminophen (PERCOCET/ROXICET) 5-325 MG tablet   oxyCODONE-acetaminophen (PERCOCET/ROXICET) 5-325 MG tablet (Start on 09/08/2018)   oxyCODONE-acetaminophen (PERCOCET/ROXICET) 5-325 MG tablet (Start on 10/08/2018)   History of fall    Recent fall occurred while home alone.  Strongly advised to subscribe to [employer]       Nausea in adult    Etiology likely gastritis . Trial of Megace.  Alprazolam refilled as well       Osteoporosis    T scores were osteopenic range May 2019 [facility]) but she has a history of nontraumatic compression fracture of L2 vertebrae .  Given her esophageal issures and history of DVt  Will recommend Prolia .  She has been deferring treatment as recommended by her rheumatologist       Type 2 diabetes mellitus without complications (HCC)   Relevant Orders   Hemoglobin A1c (Completed)      Other Visit Diagnoses    Nausea    -  Primary   Relevant Orders   H Pylori, IGM, IGG, IGA AB   Comp Met (CMET)      I have discontinued Eleesha B. Pingree's nystatin cream. I am also having her start on ALPRAZolam and megestrol. Additionally, I am having her maintain her hydroxychloroquine, tadalafil (PAH), predniSONE, metoprolol succinate, mycophenolate, Polyvinyl Alcohol-Povidone (REFRESH OP), OXYGEN, Calcium Carbonate-Vitamin D, pantoprazole, cyanocobalamin, SYRINGE 3CC/25GX1", vitamin C, sucralfate, promethazine, Lactulose, ondansetron, Probiotic, furosemide, letrozole, SYNTHROID, lisinopril, warfarin, metoCLOPramide, oxyCODONE-acetaminophen, oxyCODONE-acetaminophen, and oxyCODONE-acetaminophen.  Meds ordered this encounter  Medications  . ALPRAZolam (XANAX) 0.25 MG tablet    Sig: Take 1 tablet (0.25 mg total) by mouth 2 (two) times daily as needed for anxiety.    Dispense:  20 tablet    Refill:  5  . megestrol (MEGACE) 20 MG tablet    Sig: Take 1 tablet (20 mg total) by mouth 2 (two) times daily.    Dispense:  60 tablet    Refill:  2    For anorexia  . oxyCODONE-acetaminophen (PERCOCET/ROXICET) 5-325 MG tablet    Sig: Take 1 tablet by mouth every 8 (eight) hours as needed for severe pain.    Dispense:  65 tablet    Refill:  0  . oxyCODONE-acetaminophen (PERCOCET/ROXICET) 5-325 MG tablet    Sig: Take 1 tablet by mouth every 8 (eight) hours as needed for severe pain.    Dispense:  65 tablet    Refill:  0    May refill on or after September 08 2018  . oxyCODONE-acetaminophen (PERCOCET/ROXICET) 5-325 MG tablet    Sig: Take 1 tablet by mouth every 8 (eight) hours as needed for severe pain.    Dispense:  65 tablet    Refill:  0    May refill on or after Oct 08 2018    Medications Discontinued During This Encounter  Medication  Reason  . nystatin cream (MYCOSTATIN) Error  . oxyCODONE-acetaminophen (PERCOCET/ROXICET) 5-325 MG tablet Reorder  . oxyCODONE-acetaminophen  (PERCOCET/ROXICET) 5-325 MG tablet Reorder   A total of 40 minutes was spent with patient more than half of which was spent in counseling patient on the above mentioned issues , reviewing and explaining recent labs and imaging studies done, and coordination of care.  Follow-up: Return in about 6 months (around 02/07/2019).   Sherlene Shams, MD

## 2018-08-09 NOTE — Patient Instructions (Signed)
I am prescribing two different medications for your nausea:  Megace and alprazolam   Megestrol tablets What is this medicine? MEGESTROL (me JES trol) belongs to a class of drugs known as progestins. Megestrol tablets are used to treat advanced breast or endometrial cancer. This medicine may be used for other purposes; ask your health care provider or pharmacist if you have questions. COMMON BRAND NAME(S): Megace What should I tell my health care provider before I take this medicine? They need to know if you have any of these conditions: -adrenal gland problems -history of blood clots of the legs, lungs, or other parts of the body -diabetes -kidney disease -liver disease -stroke -an unusual or allergic reaction to megestrol, other medicines, foods, dyes, or preservatives -pregnant or trying to get pregnant -breast-feeding How should I use this medicine? Take this medicine by mouth. Follow the directions on the prescription label. Do not take your medicine more often than directed. Take your doses at regular intervals. Do not stop taking except on the advice of your doctor or health care professional. Talk to your pediatrician regarding the use of this medicine in children. Special care may be needed. Overdosage: If you think you have taken too much of this medicine contact a poison control center or emergency room at once. NOTE: This medicine is only for you. Do not share this medicine with others. What if I miss a dose? If you miss a dose, take it as soon as you can. If it is almost time for your next dose, take only that dose. Do not take double or extra doses. What may interact with this medicine? Do not take this medicine with any of the following medications: -dofetilide This medicine may also interact with the following medications: -carbamazepine -indinavir -phenobarbital -phenytoin -primidone -rifampin -warfarin This list may not describe all possible interactions. Give  your health care provider a list of all the medicines, herbs, non-prescription drugs, or dietary supplements you use. Also tell them if you smoke, drink alcohol, or use illegal drugs. Some items may interact with your medicine. What should I watch for while using this medicine? Visit your doctor or health care professional for regular checks on your progress. Continue taking this medicine even if you feel better. It may take 2 months of regular use before you know if this medicine is working for your condition. If you are a female of child-bearing age, use an effective method of birth control while you are taking this medicine. This medicine should not be used by females who are pregnant or breast-feeding. There is a potential for serious side effects to an unborn child or to an infant. Talk to your health care professional or pharmacist for more information. If you have diabetes, this medicine may affect blood sugar levels. Check your blood sugar and talk to your doctor or health care professional if you notice changes. What side effects may I notice from receiving this medicine? Side effects that you should report to your doctor or health care professional as soon as possible: -difficulty breathing or shortness of breath -chest pain -dizziness -fluid retention -increased blood pressure -leg pain or swelling -nausea and vomiting -skin rash or itching -weakness Side effects that usually do not require medical attention (report to your doctor or health care professional if they continue or are bothersome): -breakthrough menstrual bleeding -hot flashes or flushing -increased appetite -mood changes -sweating -weight gain This list may not describe all possible side effects. Call your doctor for medical advice about  side effects. You may report side effects to FDA at 1-800-FDA-1088. Where should I keep my medicine? Keep out of the reach of children. Store at controlled room temperature between  15 and 30 degrees C (59 and 86 degrees F). Protect from heat above 40 degrees C (104 degrees F). Throw away any unused medicine after the expiration date. NOTE: This sheet is a summary. It may not cover all possible information. If you have questions about this medicine, talk to your doctor, pharmacist, or health care provider.  2018 Elsevier/Gold Standard (2008-03-24 15:57:10)

## 2018-08-10 ENCOUNTER — Telehealth: Payer: Self-pay

## 2018-08-10 DIAGNOSIS — Z7901 Long term (current) use of anticoagulants: Secondary | ICD-10-CM

## 2018-08-10 NOTE — Telephone Encounter (Signed)
Lab ordered.

## 2018-08-11 DIAGNOSIS — M810 Age-related osteoporosis without current pathological fracture: Secondary | ICD-10-CM | POA: Insufficient documentation

## 2018-08-11 LAB — SPECIMEN STATUS REPORT

## 2018-08-11 NOTE — Assessment & Plan Note (Addendum)
Secondary to severe scoliosis.  She was referred to Dr Sabra Heck  In August for evaluation for possible AVN  given history of steroid use. Her current pain appears to be from contusions from her mechanical fall without fractures. Refill history confirmed via [place] Controlled Substance databas, accessed by me today.Marland Kitchen

## 2018-08-11 NOTE — Assessment & Plan Note (Signed)
INR/coumadin level is therapeutic,  Continue current regimen and repeat PT/INR in one month.  Lab Results  Component Value Date   INR 2.0 (H) 08/09/2018   INR 1.7 (H) 08/01/2018   INR 2.3 (H) 07/04/2018

## 2018-08-11 NOTE — Assessment & Plan Note (Signed)
Etiology likely gastritis . Trial of Megace.  Alprazolam refilled as well

## 2018-08-11 NOTE — Assessment & Plan Note (Signed)
Recent fall occurred while home alone.  Strongly advised to subscribe to [facility]

## 2018-08-11 NOTE — Assessment & Plan Note (Addendum)
T scores were osteopenic range May 2019 [facility]) but she has a history of nontraumatic compression fracture of L2 vertebrae .  Given her esophageal issures and history of DVt  Will recommend Prolia .  She has been deferring treatment as recommended by her rheumatologist

## 2018-08-14 ENCOUNTER — Telehealth: Payer: Self-pay

## 2018-08-14 NOTE — Telephone Encounter (Signed)
PA for Megace has been submitted on covermymeds.

## 2018-08-15 LAB — H PYLORI, IGM, IGG, IGA AB
H PYLORI IGG: 0.92 {index_val} — AB (ref 0.00–0.79)
H pylori, IgM Abs: <9 units (ref 0.0–8.9)
H. pylori, IgA Abs: 26.3 units — ABNORMAL HIGH (ref 0.0–8.9)

## 2018-08-22 ENCOUNTER — Other Ambulatory Visit: Payer: Medicare Other

## 2018-08-30 ENCOUNTER — Other Ambulatory Visit: Payer: Self-pay | Admitting: Oncology

## 2018-08-30 DIAGNOSIS — C50412 Malignant neoplasm of upper-outer quadrant of left female breast: Secondary | ICD-10-CM

## 2018-09-01 ENCOUNTER — Other Ambulatory Visit: Payer: Self-pay | Admitting: Internal Medicine

## 2018-09-06 DIAGNOSIS — R634 Abnormal weight loss: Secondary | ICD-10-CM | POA: Diagnosis not present

## 2018-09-06 DIAGNOSIS — R1030 Lower abdominal pain, unspecified: Secondary | ICD-10-CM | POA: Diagnosis not present

## 2018-09-10 ENCOUNTER — Telehealth: Payer: Self-pay | Admitting: *Deleted

## 2018-09-10 ENCOUNTER — Other Ambulatory Visit (INDEPENDENT_AMBULATORY_CARE_PROVIDER_SITE_OTHER): Payer: Medicare Other

## 2018-09-10 DIAGNOSIS — R1030 Lower abdominal pain, unspecified: Secondary | ICD-10-CM | POA: Diagnosis not present

## 2018-09-10 DIAGNOSIS — Z7901 Long term (current) use of anticoagulants: Secondary | ICD-10-CM

## 2018-09-10 LAB — PROTIME-INR
INR: 5.3 ratio — AB (ref 0.8–1.0)
Prothrombin Time: 60.4 s (ref 9.6–13.1)

## 2018-09-10 NOTE — Telephone Encounter (Signed)
Noted.  Pt not having any bleeding.  Not on abx.  Has a history of GI issues.  On 3mg  q day.  Hold tonight.  Recheck pt/inr in am.  Lauren in office tomorrow.  Made aware of lab today.  She placed order for pt/inr.  Will f/u on results in am for further directions regarding coumadin.

## 2018-09-10 NOTE — Telephone Encounter (Signed)
CRITICAL VALUE STICKER  CRITICAL VALUE: PT-60.4  RECEIVER (on-site recipient of call):Toya, CMA/XT  DATE & TIME NOTIFIED: 09/10/18 @ 4:50pm  MESSENGER (representative from lab): Santiago Glad with [facility] MD NOTIFIED:   TIME OF NOTIFICATION: 4:53  RESPONSE: see phone note or lab result.

## 2018-09-10 NOTE — Telephone Encounter (Signed)
Patient is on 3 mg daily of coumadin daily , told her to hold tonight going to [facility] tomorrow for redraw of INR.

## 2018-09-11 ENCOUNTER — Encounter: Payer: Self-pay | Admitting: *Deleted

## 2018-09-11 ENCOUNTER — Other Ambulatory Visit
Admission: RE | Admit: 2018-09-11 | Discharge: 2018-09-11 | Disposition: A | Discharge status: Home / Self Care | Payer: Medicare Other | Source: Ambulatory Visit | Attending: Internal Medicine | Admitting: Internal Medicine

## 2018-09-11 ENCOUNTER — Telehealth: Payer: Self-pay | Admitting: Internal Medicine

## 2018-09-11 ENCOUNTER — Other Ambulatory Visit: Payer: Self-pay | Admitting: Family Medicine

## 2018-09-11 ENCOUNTER — Other Ambulatory Visit: Payer: Medicare Other

## 2018-09-11 DIAGNOSIS — Z7901 Long term (current) use of anticoagulants: Secondary | ICD-10-CM | POA: Insufficient documentation

## 2018-09-11 LAB — PROTIME-INR
INR: 4.3
Prothrombin Time: 40.6 seconds — ABNORMAL HIGH (ref 11.4–15.2)

## 2018-09-11 MED ORDER — METOPROLOL SUCCINATE ER 25 MG TABLET,EXTENDED RELEASE 24 HR
ORAL_TABLET | 1 refills | 0 days | Status: CP
Start: 2018-09-11 — End: 2019-03-08

## 2018-09-11 NOTE — Telephone Encounter (Signed)
This encounter was created in error - please disregard.

## 2018-09-11 NOTE — Telephone Encounter (Signed)
Pt called in to advised that she forgot to tell the nurse she was also taking Coricidin - 2 tablets at bedtime for about 4 days.

## 2018-09-11 NOTE — Telephone Encounter (Signed)
Please advise 

## 2018-09-11 NOTE — Telephone Encounter (Signed)
0.064 (L) TSH  12.1 (H) Free T4   09/06/18 thyroid labs above checked by GI  Pt was taking synthyroid 150 mcg at 5 pm  -advised take in am 30 minutes before breakfast, avoid with supplements and protonix x 4 hours  -new dose take synthryoid daily except Sundays no dose  This was noted when called pt to disc INR results    Will have PCP f/u has appt 11/14/18

## 2018-09-11 NOTE — Telephone Encounter (Signed)
'  Mable Fill' from [place] calling to report INR of 4.3. [place] called practice, spoke with Judson Roch who will alert MD.

## 2018-09-11 NOTE — Telephone Encounter (Signed)
I spoke with patient just now & she is on her way to the hospital to have her PT/INR rechecked. She is aware that she will hear from [place] today before we leave the office

## 2018-09-11 NOTE — Telephone Encounter (Signed)
Hold coumadin dose for 09/11/18  INR 4.3   Mail coumadin diet list as far as foods with vitamin K  Coricidan and coumadin should not interact but antibiotics can  If you are taking 3 mg daily reduce the dose to 3 mg daily except on Weds take 1/2 pill of 3 mg  Further recs disc with PCP    [place]

## 2018-09-13 ENCOUNTER — Telehealth: Payer: Self-pay | Admitting: Family Medicine

## 2018-09-13 ENCOUNTER — Other Ambulatory Visit (INDEPENDENT_AMBULATORY_CARE_PROVIDER_SITE_OTHER): Payer: Medicare Other

## 2018-09-13 DIAGNOSIS — Z7901 Long term (current) use of anticoagulants: Secondary | ICD-10-CM | POA: Diagnosis not present

## 2018-09-13 LAB — PROTIME-INR
INR: 3.2 ratio — ABNORMAL HIGH (ref 0.8–1.0)
Prothrombin Time: 36.6 s — ABNORMAL HIGH (ref 9.6–13.1)

## 2018-09-13 NOTE — Telephone Encounter (Signed)
Spoke with pt and advised her of the message and and she stated that she is currently taking 154mcg. The 182mcg has not been sent in.

## 2018-09-13 NOTE — Telephone Encounter (Signed)
I spoke with the patient.  INR result reviewed.  It is still above goal.  Up-to-date recommendations reviewed given her elevated INR several days ago.  Based on up-to-date guideline algorithm for her initial INR of 5.3 the recommendations are to hold her Coumadin until her INR is 2-3.  We will have her hold an additional dose and recheck her INR tomorrow and consider restarting Coumadin after that returns.Diana [name]

## 2018-09-13 NOTE — Telephone Encounter (Signed)
Diana [name],  Disregard TMs' directive. patient was given different instructions on Dec 24 by Tito Dine , and is supposed to be getting an INR today

## 2018-09-13 NOTE — Telephone Encounter (Signed)
I do not see that the Synthroid dose was reduced from 150 mcg daily ,  Which she has been taking since July.  If the lower dose (137 mcg daily) has not been called in, let me know and I will send.  She can continue to take it at 5 pm if  5 pm is the easiest time for her to take it on an empty stomach and 40 minutes prior to other meds and foo.d

## 2018-09-14 ENCOUNTER — Other Ambulatory Visit: Payer: Medicare Other

## 2018-09-14 ENCOUNTER — Telehealth: Payer: Self-pay | Admitting: Internal Medicine

## 2018-09-14 MED ORDER — LEVOTHYROXINE SODIUM 137 MCG PO TABS: 137.0000 ug | ORAL_TABLET | Freq: Every day | ORAL | 0 refills | Status: DC

## 2018-09-14 MED ORDER — WARFARIN SODIUM 2.5 MG PO TABS: 2.5000 mg | ORAL_TABLET | Freq: Every day | ORAL | 1 refills | Status: DC

## 2018-09-14 NOTE — Telephone Encounter (Signed)
Patient is coming in today for recheck.

## 2018-09-14 NOTE — Telephone Encounter (Signed)
Pt will be coming in at 2 PM and NOT 2:45 PM per provider request.   Diana [name]

## 2018-09-14 NOTE — Telephone Encounter (Signed)
See other phone note. Per Dr. Derrel Nip, patient has been rescheduled to next Friday with additional instructions.

## 2018-09-14 NOTE — Telephone Encounter (Signed)
Pt returned my call & verbalized new medication change. She does not have any 1mg  tablets so she will not take anything tonight & will pick up new Rx and start the 2.5mg  tomorrow (Saturday). Pt aware of lab appt on 09/21/2018. She is also aware that we are going to setup her up with [facility]. If she has not receive that equipment by her lab appt she will come in for a lab draw.

## 2018-09-14 NOTE — Telephone Encounter (Signed)
YESTERDAY'S INR REVIEWED.  Please ask patient to take 1 mg today,  Ly (or none if she doesn't have any 1 mg tablets). Starting tomorrow (Saturday)  She will take 2.5 mg daily  And repeat an INR next Friday.  Diana [name] since she does't have a lot of venous access, please talk to her about doing the weekly home INRs from a fingerstick to save her access

## 2018-09-14 NOTE — Telephone Encounter (Signed)
REDUCED SYNTHROD DOSE SENT TO LOCAL PHARMACY  [address]

## 2018-09-14 NOTE — Telephone Encounter (Signed)
Discussed with Diana [name] and patient has only one access site for this to be drawn and has had bruising with recent draws. Discussed with Dr Derrel Nip as well and I will forward to her to consider adjustments to her coumadin dosage and see if she can delay INR recheck.

## 2018-09-14 NOTE — Telephone Encounter (Signed)
Left message for pt to return my call. Left [facility] number on voicemail.

## 2018-09-14 NOTE — Telephone Encounter (Signed)
Noted, this will be her 4th INR check this week. Forwarding message to PCP for continuity of care.

## 2018-09-14 NOTE — Addendum Note (Signed)
Addended by: Crecencio Mc on: 09/14/2018 11:25 AM   Modules accepted: Orders

## 2018-09-14 NOTE — Telephone Encounter (Signed)
Can you call the patient and see if she can come in early for her INR appointment? Please see if she can come in around 2 pm so the lab can send it out with our afternoon pick up.

## 2018-09-21 ENCOUNTER — Other Ambulatory Visit (INDEPENDENT_AMBULATORY_CARE_PROVIDER_SITE_OTHER): Payer: Medicare Other

## 2018-09-21 DIAGNOSIS — Z7901 Long term (current) use of anticoagulants: Secondary | ICD-10-CM | POA: Diagnosis not present

## 2018-09-21 LAB — PROTIME-INR
INR: 2.4 ratio — AB (ref 0.8–1.0)
Prothrombin Time: 27.7 s — ABNORMAL HIGH (ref 9.6–13.1)

## 2018-09-23 NOTE — Addendum Note (Signed)
Addended by: Crecencio Mc on: 09/23/2018 09:23 PM   Modules accepted: Orders

## 2018-09-25 ENCOUNTER — Other Ambulatory Visit: Payer: Self-pay | Admitting: *Deleted

## 2018-09-25 DIAGNOSIS — D539 Nutritional anemia, unspecified: Secondary | ICD-10-CM

## 2018-09-27 ENCOUNTER — Inpatient Hospital Stay: Payer: Medicare Other | Attending: Oncology

## 2018-09-27 ENCOUNTER — Other Ambulatory Visit: Payer: Self-pay

## 2018-09-27 DIAGNOSIS — D631 Anemia in chronic kidney disease: Secondary | ICD-10-CM | POA: Diagnosis not present

## 2018-09-27 DIAGNOSIS — Z923 Personal history of irradiation: Secondary | ICD-10-CM | POA: Insufficient documentation

## 2018-09-27 DIAGNOSIS — Z79811 Long term (current) use of aromatase inhibitors: Secondary | ICD-10-CM | POA: Insufficient documentation

## 2018-09-27 DIAGNOSIS — Z170 Estrogen receptor positive status [ER+]: Secondary | ICD-10-CM | POA: Diagnosis not present

## 2018-09-27 DIAGNOSIS — N189 Chronic kidney disease, unspecified: Secondary | ICD-10-CM | POA: Insufficient documentation

## 2018-09-27 DIAGNOSIS — Z79899 Other long term (current) drug therapy: Secondary | ICD-10-CM | POA: Insufficient documentation

## 2018-09-27 DIAGNOSIS — D539 Nutritional anemia, unspecified: Secondary | ICD-10-CM

## 2018-09-27 DIAGNOSIS — C50412 Malignant neoplasm of upper-outer quadrant of left female breast: Secondary | ICD-10-CM | POA: Diagnosis not present

## 2018-09-27 LAB — CBC
HCT: 28.3 % — ABNORMAL LOW (ref 36.0–46.0)
Hemoglobin: 8.2 g/dL — ABNORMAL LOW (ref 12.0–15.0)
MCH: 29 pg (ref 26.0–34.0)
MCHC: 29 g/dL — ABNORMAL LOW (ref 30.0–36.0)
MCV: 100 fL (ref 80.0–100.0)
NRBC: 0.4 % — AB (ref 0.0–0.2)
Platelets: 372 10*3/uL (ref 150–400)
RBC: 2.83 MIL/uL — ABNORMAL LOW (ref 3.87–5.11)
RDW: 16.7 % — ABNORMAL HIGH (ref 11.5–15.5)
WBC: 4.6 10*3/uL (ref 4.0–10.5)

## 2018-09-27 LAB — IRON AND TIBC
Iron: 32 ug/dL (ref 28–170)
Saturation Ratios: 21 % (ref 10.4–31.8)
TIBC: 156 ug/dL — ABNORMAL LOW (ref 250–450)
UIBC: 124 ug/dL

## 2018-09-27 LAB — FERRITIN: Ferritin: 111 ng/mL (ref 11–307)

## 2018-10-01 ENCOUNTER — Other Ambulatory Visit: Payer: Self-pay | Admitting: Internal Medicine

## 2018-10-03 ENCOUNTER — Other Ambulatory Visit (INDEPENDENT_AMBULATORY_CARE_PROVIDER_SITE_OTHER): Payer: Medicare Other

## 2018-10-03 ENCOUNTER — Telehealth: Payer: Self-pay | Admitting: Internal Medicine

## 2018-10-03 DIAGNOSIS — Z7901 Long term (current) use of anticoagulants: Secondary | ICD-10-CM

## 2018-10-03 LAB — PROTIME-INR
INR: 1.6 ratio — ABNORMAL HIGH (ref 0.8–1.0)
Prothrombin Time: 18.5 s — ABNORMAL HIGH (ref 9.6–13.1)

## 2018-10-03 LAB — POCT INR: INR: 1.8 — AB (ref <=1.1)

## 2018-10-03 NOTE — Telephone Encounter (Signed)
Copied from [place] [id number]. Topic: General - Other >> Oct 03, 2018  5:12 PM Keene Breath wrote: Reason for CRM: Danae Chen with [facility] called to report an out of range INR - 1.8 which was tested today, 10/03/2018.  Please advise and call back with any questions [phone]# [phone]

## 2018-10-04 ENCOUNTER — Ambulatory Visit: Admit: 2018-10-04 | Discharge: 2018-10-04 | Payer: MEDICARE

## 2018-10-04 ENCOUNTER — Ambulatory Visit: Admit: 2018-10-04 | Discharge: 2018-10-04 | Payer: MEDICARE | Attending: Nephrology | Primary: Nephrology

## 2018-10-04 ENCOUNTER — Other Ambulatory Visit: Payer: Self-pay | Admitting: Internal Medicine

## 2018-10-04 ENCOUNTER — Telehealth: Payer: Self-pay

## 2018-10-04 ENCOUNTER — Other Ambulatory Visit: Payer: Self-pay

## 2018-10-04 DIAGNOSIS — D508 Other iron deficiency anemias: Secondary | ICD-10-CM

## 2018-10-04 DIAGNOSIS — M3214 Glomerular disease in systemic lupus erythematosus: Principal | ICD-10-CM

## 2018-10-04 DIAGNOSIS — Z79899 Other long term (current) drug therapy: Secondary | ICD-10-CM | POA: Diagnosis not present

## 2018-10-04 DIAGNOSIS — I272 Pulmonary hypertension, unspecified: Secondary | ICD-10-CM | POA: Diagnosis not present

## 2018-10-04 DIAGNOSIS — R634 Abnormal weight loss: Secondary | ICD-10-CM | POA: Diagnosis not present

## 2018-10-04 DIAGNOSIS — Z86718 Personal history of other venous thrombosis and embolism: Secondary | ICD-10-CM | POA: Diagnosis not present

## 2018-10-04 DIAGNOSIS — R109 Unspecified abdominal pain: Secondary | ICD-10-CM | POA: Diagnosis not present

## 2018-10-04 DIAGNOSIS — R110 Nausea: Secondary | ICD-10-CM | POA: Diagnosis not present

## 2018-10-04 DIAGNOSIS — D649 Anemia, unspecified: Secondary | ICD-10-CM | POA: Diagnosis not present

## 2018-10-04 DIAGNOSIS — Z87891 Personal history of nicotine dependence: Secondary | ICD-10-CM | POA: Diagnosis not present

## 2018-10-04 NOTE — Telephone Encounter (Signed)
Wrong office please route accordingly. Thank you

## 2018-10-04 NOTE — Telephone Encounter (Signed)
LMTCB. Need to find out what the pt's current coumadin dose is. [facility] may speak with pt.

## 2018-10-04 NOTE — Telephone Encounter (Signed)
INR result from 10/03/2017 is 1.8. Pt stated that she is currently taking 2.5mg  twice a week according to what she told the [facility].

## 2018-10-04 NOTE — Telephone Encounter (Signed)
Pt called back.  [facility] NT was on the phone.  Asked pt what her current dose of coumadin was - pt got pill bottle and read the following to me: Coumadin 2.5MG  tablet.  Take one tablet by mouth daily.  Pt states that it was reduced and she is only taking it twice a week.

## 2018-10-08 ENCOUNTER — Ambulatory Visit
Admission: RE | Admit: 2018-10-08 | Discharge: 2018-10-08 | Disposition: A | Discharge status: Home / Self Care | Payer: Medicare Other | Source: Ambulatory Visit | Attending: Oncology | Admitting: Oncology

## 2018-10-08 DIAGNOSIS — R922 Inconclusive mammogram: Secondary | ICD-10-CM | POA: Diagnosis not present

## 2018-10-08 DIAGNOSIS — Z08 Encounter for follow-up examination after completed treatment for malignant neoplasm: Secondary | ICD-10-CM

## 2018-10-08 DIAGNOSIS — Z853 Personal history of malignant neoplasm of breast: Principal | ICD-10-CM

## 2018-10-09 ENCOUNTER — Other Ambulatory Visit: Payer: Self-pay | Admitting: *Deleted

## 2018-10-09 ENCOUNTER — Encounter: Payer: Self-pay | Admitting: Oncology

## 2018-10-09 ENCOUNTER — Inpatient Hospital Stay: Payer: Medicare Other

## 2018-10-09 ENCOUNTER — Telehealth: Payer: Self-pay | Admitting: *Deleted

## 2018-10-09 ENCOUNTER — Other Ambulatory Visit: Payer: Self-pay

## 2018-10-09 ENCOUNTER — Inpatient Hospital Stay (HOSPITAL_BASED_OUTPATIENT_CLINIC_OR_DEPARTMENT_OTHER): Payer: Medicare Other | Admitting: Oncology

## 2018-10-09 ENCOUNTER — Other Ambulatory Visit: Payer: Self-pay | Admitting: Internal Medicine

## 2018-10-09 VITALS — BP 110/70 | HR 96 | Temp 98.6°F | Resp 16 | Ht 61.0 in | Wt 110.6 lb

## 2018-10-09 DIAGNOSIS — D631 Anemia in chronic kidney disease: Secondary | ICD-10-CM | POA: Diagnosis not present

## 2018-10-09 DIAGNOSIS — Z923 Personal history of irradiation: Secondary | ICD-10-CM

## 2018-10-09 DIAGNOSIS — Z79899 Other long term (current) drug therapy: Secondary | ICD-10-CM

## 2018-10-09 DIAGNOSIS — C50412 Malignant neoplasm of upper-outer quadrant of left female breast: Secondary | ICD-10-CM

## 2018-10-09 DIAGNOSIS — Z79811 Long term (current) use of aromatase inhibitors: Secondary | ICD-10-CM | POA: Diagnosis not present

## 2018-10-09 DIAGNOSIS — D539 Nutritional anemia, unspecified: Secondary | ICD-10-CM

## 2018-10-09 DIAGNOSIS — D518 Other vitamin B12 deficiency anemias: Secondary | ICD-10-CM

## 2018-10-09 DIAGNOSIS — Z170 Estrogen receptor positive status [ER+]: Secondary | ICD-10-CM

## 2018-10-09 DIAGNOSIS — N189 Chronic kidney disease, unspecified: Secondary | ICD-10-CM

## 2018-10-09 LAB — RETICULOCYTES
Immature Retic Fract: 10.8 % (ref 2.3–15.9)
RBC.: 2.73 MIL/uL — ABNORMAL LOW (ref 3.87–5.11)
Retic Count, Absolute: 57.3 10*3/uL (ref 19.0–186.0)
Retic Ct Pct: 2.1 % (ref 0.4–3.1)

## 2018-10-09 LAB — CBC WITH DIFFERENTIAL/PLATELET
Abs Immature Granulocytes: 0.01 10*3/uL (ref 0.00–0.07)
Basophils Absolute: 0.1 10*3/uL (ref 0.0–0.1)
Basophils Relative: 2 %
Eosinophils Absolute: 0.2 10*3/uL (ref 0.0–0.5)
Eosinophils Relative: 3 %
HCT: 27.5 % — ABNORMAL LOW (ref 36.0–46.0)
Hemoglobin: 8.1 g/dL — ABNORMAL LOW (ref 12.0–15.0)
Immature Granulocytes: 0 %
Lymphocytes Relative: 20 %
Lymphs Abs: 1.1 10*3/uL (ref 0.7–4.0)
MCH: 29.7 pg (ref 26.0–34.0)
MCHC: 29.5 g/dL — ABNORMAL LOW (ref 30.0–36.0)
MCV: 100.7 fL — ABNORMAL HIGH (ref 80.0–100.0)
Monocytes Absolute: 0.7 10*3/uL (ref 0.1–1.0)
Monocytes Relative: 13 %
Neutro Abs: 3.5 10*3/uL (ref 1.7–7.7)
Neutrophils Relative %: 62 %
Platelets: 315 10*3/uL (ref 150–400)
RBC: 2.73 MIL/uL — ABNORMAL LOW (ref 3.87–5.11)
RDW: 16.8 % — ABNORMAL HIGH (ref 11.5–15.5)
WBC: 5.6 10*3/uL (ref 4.0–10.5)
nRBC: 0.7 % — ABNORMAL HIGH (ref 0.0–0.2)

## 2018-10-09 LAB — COMPREHENSIVE METABOLIC PANEL
ALK PHOS: 49 U/L (ref 38–126)
ALT: 7 U/L (ref 0–44)
AST: 19 U/L (ref 15–41)
Albumin: 2.9 g/dL — ABNORMAL LOW (ref 3.5–5.0)
Anion gap: 8 (ref 5–15)
BUN: 28 mg/dL — ABNORMAL HIGH (ref 8–23)
CALCIUM: 8.6 mg/dL — AB (ref 8.9–10.3)
CO2: 24 mmol/L (ref 22–32)
CREATININE: 1.47 mg/dL — AB (ref 0.44–1.00)
Chloride: 112 mmol/L — ABNORMAL HIGH (ref 98–111)
GFR calc Af Amer: 38 mL/min — ABNORMAL LOW (ref >60)
GFR calc non Af Amer: 33 mL/min — ABNORMAL LOW (ref >60)
Glucose, Bld: 175 mg/dL — ABNORMAL HIGH (ref 70–99)
Potassium: 4.2 mmol/L (ref 3.5–5.1)
Sodium: 144 mmol/L (ref 135–145)
Total Bilirubin: 0.2 mg/dL — ABNORMAL LOW (ref 0.3–1.2)
Total Protein: 6.4 g/dL — ABNORMAL LOW (ref 6.5–8.1)

## 2018-10-09 LAB — FOLATE: Folate: 28 ng/mL (ref >5.9)

## 2018-10-09 LAB — VITAMIN B12: VITAMIN B 12: 323 pg/mL (ref 180–914)

## 2018-10-09 LAB — TSH: TSH: 0.105 u[IU]/mL — ABNORMAL LOW (ref 0.350–4.500)

## 2018-10-09 MED ORDER — LEVOTHYROXINE SODIUM 125 MCG PO TABS: 125.0000 ug | ORAL_TABLET | Freq: Every day | ORAL | 0 refills | Status: DC

## 2018-10-09 NOTE — Telephone Encounter (Signed)
Contact [facility] to suggest canceling upcoming appointment for this patient for Iron infusion. Patient's Ferritin level is 111 as of 1/9. Left message with physician nurse.

## 2018-10-09 NOTE — Progress Notes (Signed)
Patient here for follow up. She had a mammo yesterday. Patient reports no changes in her breasts since last appt.

## 2018-10-10 ENCOUNTER — Telehealth: Payer: Self-pay | Admitting: Internal Medicine

## 2018-10-10 LAB — POCT INR: INR: 1.7 — AB (ref 0.9–1.1)

## 2018-10-10 LAB — MULTIPLE MYELOMA PANEL, SERUM
ALBUMIN SERPL ELPH-MCNC: 2.9 g/dL (ref 2.9–4.4)
Albumin/Glob SerPl: 1 (ref 0.7–1.7)
Alpha 1: 0.3 g/dL (ref 0.0–0.4)
Alpha2 Glob SerPl Elph-Mcnc: 1 g/dL (ref 0.4–1.0)
B-Globulin SerPl Elph-Mcnc: 0.9 g/dL (ref 0.7–1.3)
Gamma Glob SerPl Elph-Mcnc: 0.9 g/dL (ref 0.4–1.8)
Globulin, Total: 3.1 g/dL (ref 2.2–3.9)
IgA: 499 mg/dL — ABNORMAL HIGH (ref 64–422)
IgG (Immunoglobin G), Serum: 1051 mg/dL (ref 700–1600)
IgM (Immunoglobulin M), Srm: 84 mg/dL (ref 26–217)
Total Protein ELP: 6 g/dL (ref 6.0–8.5)

## 2018-10-10 LAB — HAPTOGLOBIN: HAPTOGLOBIN: 344 mg/dL — AB (ref 41–333)

## 2018-10-10 NOTE — Telephone Encounter (Signed)
'  Diana [name]' with [facility] calling to report INR 1.7 , drawn today.

## 2018-10-10 NOTE — Telephone Encounter (Signed)
Raquel called from [facility] and said that patient was scheduled for an iron infusion 1/29. They also had drew labs and it indicated that she needs iron. She wants me to call her and fax her lab values. She only left fax # so I can only fax labs with Dr. Janese Banks recommendation not to give iron treatment.faxed the labs: [phone]

## 2018-10-11 ENCOUNTER — Telehealth: Payer: Self-pay

## 2018-10-11 ENCOUNTER — Telehealth: Payer: Self-pay | Admitting: *Deleted

## 2018-10-11 DIAGNOSIS — E039 Hypothyroidism, unspecified: Secondary | ICD-10-CM

## 2018-10-11 MED ORDER — WARFARIN SODIUM 3 MG PO TABS: 3.0000 mg | ORAL_TABLET | Freq: Every day | ORAL | 0 refills | Status: DC

## 2018-10-11 MED ORDER — OXYCODONE-ACETAMINOPHEN 5-325 MG PO TABS: 1.0000 | ORAL_TABLET | Freq: Three times a day (TID) | ORAL | 0 refills | Status: DC | PRN

## 2018-10-11 NOTE — Telephone Encounter (Signed)
INR result from 10/10/2018 is 1.7. Pt stated that she is currently taking 2.5mg  daily.

## 2018-10-11 NOTE — Progress Notes (Signed)
Hematology/Oncology Consult note [facility]  Telephone:([phone] [phone]  Patient Care Team: Crecencio Mc, MD as PCP - General (Internal Medicine) Bary Castilla Forest Gleason, MD (General Surgery) Gregor Hams, MD as Attending Physician (Emergency Medicine)   Name of the patient: Diana [name]  [id number]  06/08/1935   Date of visit: 10/11/18  Diagnosis-  1. Macrocytic anemia likely secondary to kidney disease and or underlying MDS 2. History of ER/PR positive stage I left breast cancer currently on hormone therapy with letrozole  Chief complaint/ Reason for visit- routine f/u of anemia  Heme/Onc history:  Oncology History   84 year old female with stage I (T1 C. N0 M0) invasive mammary carcinoma status post wide local excision and sentinel node biopsy tumor is ER/PR positive HER-2/neu not over expressed with a low Oncotype DX score of 17 for adjuvant whole breast radiation Oncotype DX score is 17 Patient has finished radiation therapy in December of 2015 Started letrozole from December, 2015     Breast cancer of upper-outer quadrant of left female breast ([place])   05/26/2014 Initial Diagnosis    Breast cancer of upper-outer quadrant of left female breast ([place]), stage I, T1 cN0 M0, ER/PR positive HER-2/neu not overexpressed.    10-15-2013 Oncotype testing    Oncotype DX score 17     - 09/05/2014 Radiation Therapy       09/05/2014 -  Anti-estrogen oral therapy    Started letrozole    Patient has been having progressive anemia especially over the last 6 months her hemoglobin is drifted down to 8 from her baseline of 10.Results of anemia work-up from 10/09/2018 are as follows: CBC showed a white count of 5.6, H&H of 8.1/27.5 with an MCV of 100 and a platelet count of 315.  CMP was significant for elevated creatinine of 1.4.  B12 folate were normal.  TSH was mildly low at 0.1.  Multiple myeloma panel showed no M protein.  Polyclonal gammopathy.   Haptoglobin was elevated at 344.  Reticulocyte count was 2.1% which is inappropriately low for the degree of anemia indicative of hypoproliferative anemia.  Iron studies showed iron saturation of 21% and ferritin of 111.  Interval history-patient continues to have issues of intermittent abdominal pain and nausea.  She has had prior episodes of diverticulitis and sees Dr. Gustavo Lah.  Other than that she is doing well and reports no new complaints.  Denies any recent falls.  ECOG PS- 2 Pain scale- 0   Review of systems- Review of Systems  Constitutional: Positive for malaise/fatigue. Negative for chills, fever and weight loss.  HENT: Negative for congestion, ear discharge and nosebleeds.   Eyes: Negative for blurred vision.  Respiratory: Negative for cough, hemoptysis, sputum production, shortness of breath and wheezing.   Cardiovascular: Negative for chest pain, palpitations, orthopnea and claudication.  Gastrointestinal: Positive for abdominal pain and nausea. Negative for blood in stool, constipation, diarrhea, heartburn, melena and vomiting.  Genitourinary: Negative for dysuria, flank pain, frequency, hematuria and urgency.  Musculoskeletal: Negative for back pain, joint pain and myalgias.  Skin: Negative for rash.  Neurological: Negative for dizziness, tingling, focal weakness, seizures, weakness and headaches.  Endo/Heme/Allergies: Does not bruise/bleed easily.  Psychiatric/Behavioral: Negative for depression and suicidal ideas. The patient does not have insomnia.       Allergies  Allergen Reactions  . Iodinated Diagnostic Agents Hives    Pt states she broke out in hives years ago, has been premedicated in the past.   .  Hematology/Oncology Consult note [facility]  [phone] [phone]  Patient Care Team: Crecencio Mc, MD as PCP - General (Internal Medicine) Bary Castilla Forest Gleason, MD (General Surgery) Gregor Hams, MD as Attending Physician (Emergency Medicine)   Name of the patient: Diana [name]  [id number]  07-Oct-1934   Date of visit: 10/11/18  Diagnosis-  1. Macrocytic anemia likely secondary to kidney disease and or underlying MDS 2. History of ER/PR positive stage I left breast cancer currently on hormone therapy with letrozole  Chief complaint/ Reason for visit- routine f/u of anemia  Heme/Onc history:  Oncology History   84 year old female with stage I (T1 C. N0 M0) invasive mammary carcinoma status post wide local excision and sentinel node biopsy tumor is ER/PR positive HER-2/neu not over expressed with a low Oncotype DX score of 17 for adjuvant whole breast radiation Oncotype DX score is 17 Patient has finished radiation therapy in December of 2015 Started letrozole from December, 2015     Breast cancer of upper-outer quadrant of left female breast ([place])   05/26/2014 Initial Diagnosis    Breast cancer of upper-outer quadrant of left female breast ([place]), stage I, T1 cN0 M0, ER/PR positive HER-2/neu not overexpressed.    April 14, 202015 Oncotype testing    Oncotype DX score 17     - 09/05/2014 Radiation Therapy       09/05/2014 -  Anti-estrogen oral therapy    Started letrozole    Patient has been having progressive anemia especially over the last 6 months her hemoglobin is drifted down to 8 from her baseline of 10.Results of anemia work-up from 10/09/2018 are as follows: CBC showed a white count of 5.6, H&H of 8.1/27.5 with an MCV of 100 and a platelet count of 315.  CMP was significant for elevated creatinine of 1.4.  B12 folate were normal.  TSH was mildly low at 0.1.  Multiple myeloma panel showed no M protein.  Polyclonal gammopathy.   Haptoglobin was elevated at 344.  Reticulocyte count was 2.1% which is inappropriately low for the degree of anemia indicative of hypoproliferative anemia.  Iron studies showed iron saturation of 21% and ferritin of 111.  Interval history-patient continues to have issues of intermittent abdominal pain and nausea.  She has had prior episodes of diverticulitis and sees Dr. Gustavo Lah.  Other than that she is doing well and reports no new complaints.  Denies any recent falls.  ECOG PS- 2 Pain scale- 0   Review of systems- Review of Systems  Constitutional: Positive for malaise/fatigue. Negative for chills, fever and weight loss.  HENT: Negative for congestion, ear discharge and nosebleeds.   Eyes: Negative for blurred vision.  Respiratory: Negative for cough, hemoptysis, sputum production, shortness of breath and wheezing.   Cardiovascular: Negative for chest pain, palpitations, orthopnea and claudication.  Gastrointestinal: Positive for abdominal pain and nausea. Negative for blood in stool, constipation, diarrhea, heartburn, melena and vomiting.  Genitourinary: Negative for dysuria, flank pain, frequency, hematuria and urgency.  Musculoskeletal: Negative for back pain, joint pain and myalgias.  Skin: Negative for rash.  Neurological: Negative for dizziness, tingling, focal weakness, seizures, weakness and headaches.  Endo/Heme/Allergies: Does not bruise/bleed easily.  Psychiatric/Behavioral: Negative for depression and suicidal ideas. The patient does not have insomnia.       Allergies  Allergen Reactions  . Iodinated Diagnostic Agents Hives    Pt states she broke out in hives years ago, has been premedicated in the past.   .  Hematology/Oncology Consult note [facility]  [phone] [phone]  Patient Care Team: Crecencio Mc, MD as PCP - General (Internal Medicine) Bary Castilla Forest Gleason, MD (General Surgery) Gregor Hams, MD as Attending Physician (Emergency Medicine)   Name of the patient: Diana [name]  [id number]  06/08/1935   Date of visit: 10/11/18  Diagnosis-  1. Macrocytic anemia likely secondary to kidney disease and or underlying MDS 2. History of ER/PR positive stage I left breast cancer currently on hormone therapy with letrozole  Chief complaint/ Reason for visit- routine f/u of anemia  Heme/Onc history:  Oncology History   84 year old female with stage I (T1 C. N0 M0) invasive mammary carcinoma status post wide local excision and sentinel node biopsy tumor is ER/PR positive HER-2/neu not over expressed with a low Oncotype DX score of 17 for adjuvant whole breast radiation Oncotype DX score is 17 Patient has finished radiation therapy in December of 2015 Started letrozole from December, 2015     Breast cancer of upper-outer quadrant of left female breast ([place])   05/26/2014 Initial Diagnosis    Breast cancer of upper-outer quadrant of left female breast ([place]), stage I, T1 cN0 M0, ER/PR positive HER-2/neu not overexpressed.    10-15-2013 Oncotype testing    Oncotype DX score 17     - 09/05/2014 Radiation Therapy       09/05/2014 -  Anti-estrogen oral therapy    Started letrozole    Patient has been having progressive anemia especially over the last 6 months her hemoglobin is drifted down to 8 from her baseline of 10.Results of anemia work-up from 10/09/2018 are as follows: CBC showed a white count of 5.6, H&H of 8.1/27.5 with an MCV of 100 and a platelet count of 315.  CMP was significant for elevated creatinine of 1.4.  B12 folate were normal.  TSH was mildly low at 0.1.  Multiple myeloma panel showed no M protein.  Polyclonal gammopathy.   Haptoglobin was elevated at 344.  Reticulocyte count was 2.1% which is inappropriately low for the degree of anemia indicative of hypoproliferative anemia.  Iron studies showed iron saturation of 21% and ferritin of 111.  Interval history-patient continues to have issues of intermittent abdominal pain and nausea.  She has had prior episodes of diverticulitis and sees Dr. Gustavo Lah.  Other than that she is doing well and reports no new complaints.  Denies any recent falls.  ECOG PS- 2 Pain scale- 0   Review of systems- Review of Systems  Constitutional: Positive for malaise/fatigue. Negative for chills, fever and weight loss.  HENT: Negative for congestion, ear discharge and nosebleeds.   Eyes: Negative for blurred vision.  Respiratory: Negative for cough, hemoptysis, sputum production, shortness of breath and wheezing.   Cardiovascular: Negative for chest pain, palpitations, orthopnea and claudication.  Gastrointestinal: Positive for abdominal pain and nausea. Negative for blood in stool, constipation, diarrhea, heartburn, melena and vomiting.  Genitourinary: Negative for dysuria, flank pain, frequency, hematuria and urgency.  Musculoskeletal: Negative for back pain, joint pain and myalgias.  Skin: Negative for rash.  Neurological: Negative for dizziness, tingling, focal weakness, seizures, weakness and headaches.  Endo/Heme/Allergies: Does not bruise/bleed easily.  Psychiatric/Behavioral: Negative for depression and suicidal ideas. The patient does not have insomnia.       Allergies  Allergen Reactions  . Iodinated Diagnostic Agents Hives    Pt states she broke out in hives years ago, has been premedicated in the past.   .  Hematology/Oncology Consult note [facility]  [phone] [phone]  Patient Care Team: Crecencio Mc, MD as PCP - General (Internal Medicine) Bary Castilla Forest Gleason, MD (General Surgery) Gregor Hams, MD as Attending Physician (Emergency Medicine)   Name of the patient: Diana [name]  [id number]  06/08/1935   Date of visit: 10/11/18  Diagnosis-  1. Macrocytic anemia likely secondary to kidney disease and or underlying MDS 2. History of ER/PR positive stage I left breast cancer currently on hormone therapy with letrozole  Chief complaint/ Reason for visit- routine f/u of anemia  Heme/Onc history:  Oncology History   84 year old female with stage I (T1 C. N0 M0) invasive mammary carcinoma status post wide local excision and sentinel node biopsy tumor is ER/PR positive HER-2/neu not over expressed with a low Oncotype DX score of 17 for adjuvant whole breast radiation Oncotype DX score is 17 Patient has finished radiation therapy in December of 2015 Started letrozole from December, 2015     Breast cancer of upper-outer quadrant of left female breast ([place])   05/26/2014 Initial Diagnosis    Breast cancer of upper-outer quadrant of left female breast ([place]), stage I, T1 cN0 M0, ER/PR positive HER-2/neu not overexpressed.    10-15-2013 Oncotype testing    Oncotype DX score 17     - 09/05/2014 Radiation Therapy       09/05/2014 -  Anti-estrogen oral therapy    Started letrozole    Patient has been having progressive anemia especially over the last 6 months her hemoglobin is drifted down to 8 from her baseline of 10.Results of anemia work-up from 10/09/2018 are as follows: CBC showed a white count of 5.6, H&H of 8.1/27.5 with an MCV of 100 and a platelet count of 315.  CMP was significant for elevated creatinine of 1.4.  B12 folate were normal.  TSH was mildly low at 0.1.  Multiple myeloma panel showed no M protein.  Polyclonal gammopathy.   Haptoglobin was elevated at 344.  Reticulocyte count was 2.1% which is inappropriately low for the degree of anemia indicative of hypoproliferative anemia.  Iron studies showed iron saturation of 21% and ferritin of 111.  Interval history-patient continues to have issues of intermittent abdominal pain and nausea.  She has had prior episodes of diverticulitis and sees Dr. Gustavo Lah.  Other than that she is doing well and reports no new complaints.  Denies any recent falls.  ECOG PS- 2 Pain scale- 0   Review of systems- Review of Systems  Constitutional: Positive for malaise/fatigue. Negative for chills, fever and weight loss.  HENT: Negative for congestion, ear discharge and nosebleeds.   Eyes: Negative for blurred vision.  Respiratory: Negative for cough, hemoptysis, sputum production, shortness of breath and wheezing.   Cardiovascular: Negative for chest pain, palpitations, orthopnea and claudication.  Gastrointestinal: Positive for abdominal pain and nausea. Negative for blood in stool, constipation, diarrhea, heartburn, melena and vomiting.  Genitourinary: Negative for dysuria, flank pain, frequency, hematuria and urgency.  Musculoskeletal: Negative for back pain, joint pain and myalgias.  Skin: Negative for rash.  Neurological: Negative for dizziness, tingling, focal weakness, seizures, weakness and headaches.  Endo/Heme/Allergies: Does not bruise/bleed easily.  Psychiatric/Behavioral: Negative for depression and suicidal ideas. The patient does not have insomnia.       Allergies  Allergen Reactions  . Iodinated Diagnostic Agents Hives    Pt states she broke out in hives years ago, has been premedicated in the past.   .  Hematology/Oncology Consult note [facility]  [phone] [phone]  Patient Care Team: Crecencio Mc, MD as PCP - General (Internal Medicine) Bary Castilla Forest Gleason, MD (General Surgery) Gregor Hams, MD as Attending Physician (Emergency Medicine)   Name of the patient: Diana [name]  [id number]  07-Oct-1934   Date of visit: 10/11/18  Diagnosis-  1. Macrocytic anemia likely secondary to kidney disease and or underlying MDS 2. History of ER/PR positive stage I left breast cancer currently on hormone therapy with letrozole  Chief complaint/ Reason for visit- routine f/u of anemia  Heme/Onc history:  Oncology History   84 year old female with stage I (T1 C. N0 M0) invasive mammary carcinoma status post wide local excision and sentinel node biopsy tumor is ER/PR positive HER-2/neu not over expressed with a low Oncotype DX score of 17 for adjuvant whole breast radiation Oncotype DX score is 17 Patient has finished radiation therapy in December of 2015 Started letrozole from December, 2015     Breast cancer of upper-outer quadrant of left female breast ([place])   05/26/2014 Initial Diagnosis    Breast cancer of upper-outer quadrant of left female breast ([place]), stage I, T1 cN0 M0, ER/PR positive HER-2/neu not overexpressed.    April 14, 202015 Oncotype testing    Oncotype DX score 17     - 09/05/2014 Radiation Therapy       09/05/2014 -  Anti-estrogen oral therapy    Started letrozole    Patient has been having progressive anemia especially over the last 6 months her hemoglobin is drifted down to 8 from her baseline of 10.Results of anemia work-up from 10/09/2018 are as follows: CBC showed a white count of 5.6, H&H of 8.1/27.5 with an MCV of 100 and a platelet count of 315.  CMP was significant for elevated creatinine of 1.4.  B12 folate were normal.  TSH was mildly low at 0.1.  Multiple myeloma panel showed no M protein.  Polyclonal gammopathy.   Haptoglobin was elevated at 344.  Reticulocyte count was 2.1% which is inappropriately low for the degree of anemia indicative of hypoproliferative anemia.  Iron studies showed iron saturation of 21% and ferritin of 111.  Interval history-patient continues to have issues of intermittent abdominal pain and nausea.  She has had prior episodes of diverticulitis and sees Dr. Gustavo Lah.  Other than that she is doing well and reports no new complaints.  Denies any recent falls.  ECOG PS- 2 Pain scale- 0   Review of systems- Review of Systems  Constitutional: Positive for malaise/fatigue. Negative for chills, fever and weight loss.  HENT: Negative for congestion, ear discharge and nosebleeds.   Eyes: Negative for blurred vision.  Respiratory: Negative for cough, hemoptysis, sputum production, shortness of breath and wheezing.   Cardiovascular: Negative for chest pain, palpitations, orthopnea and claudication.  Gastrointestinal: Positive for abdominal pain and nausea. Negative for blood in stool, constipation, diarrhea, heartburn, melena and vomiting.  Genitourinary: Negative for dysuria, flank pain, frequency, hematuria and urgency.  Musculoskeletal: Negative for back pain, joint pain and myalgias.  Skin: Negative for rash.  Neurological: Negative for dizziness, tingling, focal weakness, seizures, weakness and headaches.  Endo/Heme/Allergies: Does not bruise/bleed easily.  Psychiatric/Behavioral: Negative for depression and suicidal ideas. The patient does not have insomnia.       Allergies  Allergen Reactions  . Iodinated Diagnostic Agents Hives    Pt states she broke out in hives years ago, has been premedicated in the past.   .  Hematology/Oncology Consult note [facility]  [phone] [phone]  Patient Care Team: Crecencio Mc, MD as PCP - General (Internal Medicine) Bary Castilla Forest Gleason, MD (General Surgery) Gregor Hams, MD as Attending Physician (Emergency Medicine)   Name of the patient: Diana [name]  [id number]  07-Oct-1934   Date of visit: 10/11/18  Diagnosis-  1. Macrocytic anemia likely secondary to kidney disease and or underlying MDS 2. History of ER/PR positive stage I left breast cancer currently on hormone therapy with letrozole  Chief complaint/ Reason for visit- routine f/u of anemia  Heme/Onc history:  Oncology History   84 year old female with stage I (T1 C. N0 M0) invasive mammary carcinoma status post wide local excision and sentinel node biopsy tumor is ER/PR positive HER-2/neu not over expressed with a low Oncotype DX score of 17 for adjuvant whole breast radiation Oncotype DX score is 17 Patient has finished radiation therapy in December of 2015 Started letrozole from December, 2015     Breast cancer of upper-outer quadrant of left female breast ([place])   05/26/2014 Initial Diagnosis    Breast cancer of upper-outer quadrant of left female breast ([place]), stage I, T1 cN0 M0, ER/PR positive HER-2/neu not overexpressed.    April 14, 202015 Oncotype testing    Oncotype DX score 17     - 09/05/2014 Radiation Therapy       09/05/2014 -  Anti-estrogen oral therapy    Started letrozole    Patient has been having progressive anemia especially over the last 6 months her hemoglobin is drifted down to 8 from her baseline of 10.Results of anemia work-up from 10/09/2018 are as follows: CBC showed a white count of 5.6, H&H of 8.1/27.5 with an MCV of 100 and a platelet count of 315.  CMP was significant for elevated creatinine of 1.4.  B12 folate were normal.  TSH was mildly low at 0.1.  Multiple myeloma panel showed no M protein.  Polyclonal gammopathy.   Haptoglobin was elevated at 344.  Reticulocyte count was 2.1% which is inappropriately low for the degree of anemia indicative of hypoproliferative anemia.  Iron studies showed iron saturation of 21% and ferritin of 111.  Interval history-patient continues to have issues of intermittent abdominal pain and nausea.  She has had prior episodes of diverticulitis and sees Dr. Gustavo Lah.  Other than that she is doing well and reports no new complaints.  Denies any recent falls.  ECOG PS- 2 Pain scale- 0   Review of systems- Review of Systems  Constitutional: Positive for malaise/fatigue. Negative for chills, fever and weight loss.  HENT: Negative for congestion, ear discharge and nosebleeds.   Eyes: Negative for blurred vision.  Respiratory: Negative for cough, hemoptysis, sputum production, shortness of breath and wheezing.   Cardiovascular: Negative for chest pain, palpitations, orthopnea and claudication.  Gastrointestinal: Positive for abdominal pain and nausea. Negative for blood in stool, constipation, diarrhea, heartburn, melena and vomiting.  Genitourinary: Negative for dysuria, flank pain, frequency, hematuria and urgency.  Musculoskeletal: Negative for back pain, joint pain and myalgias.  Skin: Negative for rash.  Neurological: Negative for dizziness, tingling, focal weakness, seizures, weakness and headaches.  Endo/Heme/Allergies: Does not bruise/bleed easily.  Psychiatric/Behavioral: Negative for depression and suicidal ideas. The patient does not have insomnia.       Allergies  Allergen Reactions  . Iodinated Diagnostic Agents Hives    Pt states she broke out in hives years ago, has been premedicated in the past.   .

## 2018-10-11 NOTE — Telephone Encounter (Signed)
Labs ordered per Dr. Lupita Dawn result note from 10/09/2018.

## 2018-10-11 NOTE — Telephone Encounter (Signed)
Spoke with pt and informed her of her INR result and her medication change. Pt gave a verbal understanding. Pt stated that she is due for a refill on her pain medication. Pt stated that it was last filled she believes on 09/07/2018.

## 2018-10-11 NOTE — Telephone Encounter (Signed)
Oxycodone refilled.

## 2018-10-11 NOTE — Telephone Encounter (Signed)
See previous message

## 2018-10-11 NOTE — Telephone Encounter (Signed)
Seen to call from Raquel at the [facility] office of Dr. Domenica Fail.  She got my fax regarding the patient.  Dr. Domenica Fail is fine for her to stay here and get anything she needs including iron or injections to help with her kidney disease.  They will cancel the iron treatment that was scheduled for patient next week. I Told her that Dr. Janese Banks has said that with renal impaired people that she could get iron treatment and we will set it up here due to it is so hard for them to et to [facility] and we are much closer for patient but patient still needs f/u for kidney disease at [facility]. Raquel says she will keep her appt for md but take off the iron treatment next week.

## 2018-10-11 NOTE — Telephone Encounter (Signed)
INCREASE DOSE TO 3 MG DAILY.  NEW RX CALLED IN  TO LOCAL PHARMACY  RECHECK 1 WEEK

## 2018-10-12 ENCOUNTER — Telehealth: Payer: Self-pay | Admitting: *Deleted

## 2018-10-12 NOTE — Telephone Encounter (Signed)
Called patient to let her know that I did talk to Dr. Domenica Fail and [facility].  Actually it was her nurse Racquel and explained to her that at first we did not think that she needed to have Feraheme and that you had a very difficult time getting there to [facility] from transportation standpoint and they were willing to cancel your appointment for next week and now Dr. Janese Banks has went and looked at some more information showing that when you have renal disease that would like your iron to be a higher level so we are willing to give you Feraheme treatments.  Like to set you up for next week and the following week as well as moving your 51-month appointment up to be in a 68-month appointment.  Patient agreeable and we did some switching of some of the appointments because it did not work into her schedule for previous appointment she had already had.  Her first appointment is going to be on January 30 at 130, second appointment is February 5 at 2 PM.  And I have sent a schedule out mailed to her house with both of these appointments as well as the one in March and patient is agreeable to all above

## 2018-10-12 NOTE — Telephone Encounter (Signed)
Pt has been notified that medication has been refilled.

## 2018-10-16 ENCOUNTER — Other Ambulatory Visit: Payer: Self-pay | Admitting: Oncology

## 2018-10-17 ENCOUNTER — Telehealth: Payer: Self-pay | Admitting: Internal Medicine

## 2018-10-17 ENCOUNTER — Ambulatory Visit: Payer: Medicare Other

## 2018-10-17 ENCOUNTER — Other Ambulatory Visit: Payer: Self-pay | Admitting: Internal Medicine

## 2018-10-17 DIAGNOSIS — M419 Scoliosis, unspecified: Secondary | ICD-10-CM | POA: Diagnosis not present

## 2018-10-17 DIAGNOSIS — J9811 Atelectasis: Secondary | ICD-10-CM | POA: Diagnosis not present

## 2018-10-17 DIAGNOSIS — M5410 Radiculopathy, site unspecified: Secondary | ICD-10-CM | POA: Diagnosis not present

## 2018-10-17 MED ORDER — MIRTAZAPINE 15 MG PO TABS: ORAL_TABLET | ORAL | 2 refills | Status: DC

## 2018-10-17 NOTE — Telephone Encounter (Signed)
I talked with Dr Gustavo Lah and agree that a trial of generic Remeron is worth recommended to try to improve her appetite.  I sent the starting dose to her pharmacy,  15 mg  Take it 30 mintues prior to bedtime.  Follow up with me shoul dbe made in about 1 month

## 2018-10-18 ENCOUNTER — Inpatient Hospital Stay: Payer: Medicare Other

## 2018-10-18 ENCOUNTER — Telehealth: Payer: Self-pay | Admitting: Internal Medicine

## 2018-10-18 ENCOUNTER — Encounter: Payer: Self-pay | Admitting: Internal Medicine

## 2018-10-18 VITALS — BP 112/56 | HR 75 | Temp 98.0°F | Resp 18

## 2018-10-18 DIAGNOSIS — D631 Anemia in chronic kidney disease: Secondary | ICD-10-CM | POA: Diagnosis not present

## 2018-10-18 DIAGNOSIS — Z7901 Long term (current) use of anticoagulants: Principal | ICD-10-CM

## 2018-10-18 DIAGNOSIS — N183 Chronic kidney disease, stage 3 unspecified: Secondary | ICD-10-CM

## 2018-10-18 DIAGNOSIS — Z923 Personal history of irradiation: Secondary | ICD-10-CM | POA: Diagnosis not present

## 2018-10-18 DIAGNOSIS — N189 Chronic kidney disease, unspecified: Secondary | ICD-10-CM | POA: Diagnosis not present

## 2018-10-18 DIAGNOSIS — C50412 Malignant neoplasm of upper-outer quadrant of left female breast: Secondary | ICD-10-CM | POA: Diagnosis not present

## 2018-10-18 DIAGNOSIS — Z170 Estrogen receptor positive status [ER+]: Secondary | ICD-10-CM | POA: Diagnosis not present

## 2018-10-18 DIAGNOSIS — Z79811 Long term (current) use of aromatase inhibitors: Secondary | ICD-10-CM | POA: Diagnosis not present

## 2018-10-18 DIAGNOSIS — Z5181 Encounter for therapeutic drug level monitoring: Secondary | ICD-10-CM

## 2018-10-18 LAB — PROTIME-INR

## 2018-10-18 MED ORDER — SODIUM CHLORIDE 0.9 % IV SOLN
510.0000 mg | Freq: Once | INTRAVENOUS | Status: AC
  Administered 2018-10-18: 510 mg via INTRAVENOUS
  Filled 2018-10-18: qty 17

## 2018-10-18 MED ORDER — SODIUM CHLORIDE 0.9 % IV SOLN
Freq: Once | INTRAVENOUS | Status: AC
  Administered 2018-10-18: 14:00:00 via INTRAVENOUS
  Filled 2018-10-18: qty 250

## 2018-10-18 NOTE — Telephone Encounter (Signed)
Carla from [facility] called to report INR out of range. INR 1.7. Results will be faxed by [facility] to office.  Kerin Salen LPN notified via skype.

## 2018-10-18 NOTE — Telephone Encounter (Signed)
Spoke with pt and informed her of the medication that Dr. Derrel Nip and Dr. Gustavo Lah have recommended and the directions of use. Pt gave a verbal understanding and stated that she already has an appt scheduled for 11/14/2018.

## 2018-10-18 NOTE — Telephone Encounter (Signed)
Called patient and she voiced understanding to new dosage. With repeat of regimen.Patient checks MD-INR every Wednesday is this ok? [place] PCP.

## 2018-10-18 NOTE — Telephone Encounter (Signed)
Ok

## 2018-10-18 NOTE — Telephone Encounter (Signed)
LMTCB. Need to find out pt's current coumadin dose. P[facility] may speak with pt.

## 2018-10-18 NOTE — Telephone Encounter (Signed)
Pt called back and states current Coumadin dose is 3mg .

## 2018-10-18 NOTE — Telephone Encounter (Signed)
INR 1.7.  If pt is on 3mg  q day, then would like to increase to 4.5mg  on Monday and Thursday and 3mg  all other days.  (so she will take 1 1/2 (3mg ) tablet on Monday and Thursday and one (3mg ) tablet all other days.  Recheck pt/inr in 10 days.  Please FYI Dr Derrel Nip so she is aware of change.

## 2018-10-19 NOTE — Telephone Encounter (Signed)
She had her warfarin dose adjusted by dr Nicki Reaper yesterday,  She checks it at home.  Please get her to check it on Wednesday so Dr Nicki Reaper doesn't have to keep dealing with it on my half day

## 2018-10-19 NOTE — Telephone Encounter (Signed)
Pt checks INR on Wednesdays.

## 2018-10-22 ENCOUNTER — Telehealth: Payer: Self-pay

## 2018-10-22 NOTE — Telephone Encounter (Signed)
error 

## 2018-10-24 ENCOUNTER — Inpatient Hospital Stay: Payer: Medicare Other

## 2018-10-24 DIAGNOSIS — Z7901 Long term (current) use of anticoagulants: Secondary | ICD-10-CM | POA: Diagnosis not present

## 2018-10-24 LAB — POCT INR: INR: 1.9 — AB (ref 0.9–1.1)

## 2018-10-25 ENCOUNTER — Telehealth: Payer: Self-pay

## 2018-10-25 NOTE — Telephone Encounter (Signed)
INR result from 10/24/2018 is 1.9. Pt is currently taking 4.5mg  on Monday and Thursday and 3mg  all other days.   INR has been abstracted.

## 2018-10-25 NOTE — Telephone Encounter (Signed)
Spoke with pt and informed her of her INR results and coumadin dose change. Pt wrote it all down and repeated it with understanding.

## 2018-10-25 NOTE — Telephone Encounter (Signed)
INR is low.  Have her take 6 mg today.  Starting Friday her dose will be 4.5 mg daily on Fri, Sat, Mon, Wednesdays  and 3 mg  on Sundays and Tuesdays,   Repeat iNR one week

## 2018-10-29 DIAGNOSIS — D485 Neoplasm of uncertain behavior of skin: Secondary | ICD-10-CM | POA: Diagnosis not present

## 2018-10-29 DIAGNOSIS — D0471 Carcinoma in situ of skin of right lower limb, including hip: Secondary | ICD-10-CM | POA: Diagnosis not present

## 2018-10-29 DIAGNOSIS — B372 Candidiasis of skin and nail: Secondary | ICD-10-CM | POA: Diagnosis not present

## 2018-10-29 DIAGNOSIS — L570 Actinic keratosis: Secondary | ICD-10-CM | POA: Diagnosis not present

## 2018-10-31 ENCOUNTER — Inpatient Hospital Stay: Payer: Medicare Other | Attending: Oncology

## 2018-10-31 VITALS — BP 100/62 | HR 85 | Temp 98.1°F | Resp 18

## 2018-10-31 DIAGNOSIS — M1990 Unspecified osteoarthritis, unspecified site: Secondary | ICD-10-CM | POA: Insufficient documentation

## 2018-10-31 DIAGNOSIS — Z7901 Long term (current) use of anticoagulants: Secondary | ICD-10-CM | POA: Diagnosis not present

## 2018-10-31 DIAGNOSIS — Z923 Personal history of irradiation: Secondary | ICD-10-CM | POA: Diagnosis not present

## 2018-10-31 DIAGNOSIS — N183 Chronic kidney disease, stage 3 unspecified: Secondary | ICD-10-CM

## 2018-10-31 DIAGNOSIS — Z170 Estrogen receptor positive status [ER+]: Secondary | ICD-10-CM | POA: Diagnosis not present

## 2018-10-31 DIAGNOSIS — D631 Anemia in chronic kidney disease: Secondary | ICD-10-CM | POA: Insufficient documentation

## 2018-10-31 DIAGNOSIS — Z87891 Personal history of nicotine dependence: Secondary | ICD-10-CM | POA: Insufficient documentation

## 2018-10-31 DIAGNOSIS — Z79899 Other long term (current) drug therapy: Secondary | ICD-10-CM | POA: Insufficient documentation

## 2018-10-31 DIAGNOSIS — N189 Chronic kidney disease, unspecified: Secondary | ICD-10-CM | POA: Insufficient documentation

## 2018-10-31 DIAGNOSIS — Z79811 Long term (current) use of aromatase inhibitors: Secondary | ICD-10-CM | POA: Diagnosis not present

## 2018-10-31 DIAGNOSIS — Z86718 Personal history of other venous thrombosis and embolism: Secondary | ICD-10-CM | POA: Diagnosis not present

## 2018-10-31 DIAGNOSIS — C50412 Malignant neoplasm of upper-outer quadrant of left female breast: Secondary | ICD-10-CM | POA: Insufficient documentation

## 2018-10-31 LAB — POCT INR: INR: 2.4 — AB (ref <=1.1)

## 2018-10-31 MED ORDER — SODIUM CHLORIDE 0.9 % IV SOLN
510.0000 mg | Freq: Once | INTRAVENOUS | Status: AC
  Administered 2018-10-31: 510 mg via INTRAVENOUS
  Filled 2018-10-31: qty 17

## 2018-10-31 MED ORDER — SODIUM CHLORIDE 0.9 % IV SOLN
Freq: Once | INTRAVENOUS | Status: AC
  Administered 2018-10-31: 14:00:00 via INTRAVENOUS
  Filled 2018-10-31: qty 250

## 2018-11-01 ENCOUNTER — Telehealth: Payer: Self-pay

## 2018-11-01 NOTE — Telephone Encounter (Signed)
No,  Do not take 6 mg ,  Take 3 mg tonight,  The 6 mg was a one time order

## 2018-11-01 NOTE — Telephone Encounter (Signed)
Dr Derrel Nip has already answered.  The 6mg  was a one time dose- see note

## 2018-11-01 NOTE — Telephone Encounter (Signed)
Pt's INR is 2.4. Pt is currently taking 4.5mg  Fri, Sat, Mon, and Wed and taking 3mg  Sun and Tues.   Has been abstracted.

## 2018-11-01 NOTE — Telephone Encounter (Signed)
Spoke with Diana [name] and informed her of her INR result. Per a written note from Dr. Derrel Nip that states continue current regimen and repeat in 1 week, Diana [name] was advised of those directions. Diana [name] stated that last Thursday she was told to increase to 6mg  for just that day so she is wanting to know if she needs to take the 6mg  again tonight?

## 2018-11-01 NOTE — Telephone Encounter (Signed)
Spoke with pt and informed her to only take 3mg  today not to take the 6 mg any more. Pt gave a verbal understanding.

## 2018-11-07 ENCOUNTER — Telehealth: Payer: Self-pay

## 2018-11-07 LAB — POCT INR: INR: 5.7 — AB (ref 0.9–1.1)

## 2018-11-07 NOTE — Telephone Encounter (Signed)
Patient is aware to hold dose for today and recheck INR early tomorrow morning.

## 2018-11-07 NOTE — Telephone Encounter (Signed)
Copied from [place] [id number]. Topic: General - Other >> Nov 07, 2018 12:34 PM Judyann Munson wrote: Reason for CRM:  Tori from [employer]- is Calling  to report the patient INR of 5.7, Best contact number  [phone]    Please advise

## 2018-11-07 NOTE — Telephone Encounter (Signed)
Pt not on any abx, no bleeding. Current dose is 4.5 mg Mon/Wed/Fri/Sat and 3 mg Tues/Thurs/Sun. Has not missed any doses. Last INR was 2.4 on 11/01/2018.

## 2018-11-07 NOTE — Telephone Encounter (Signed)
Hold today and recheck early tomorrow morning; do not take any further doses of coumadin until we call her tomorrow with INR.   INR 5.7  Reviewed note 10/25/18 from PCP. She is on Fri, Sat, Mon, Wednesdays  and 3 mg  on Sundays and Tuesdays   FYI mclean  One of [facility]'s patients.   Would you be on the look out for this INR?  I am not in the office.

## 2018-11-08 ENCOUNTER — Telehealth: Payer: Self-pay | Admitting: Internal Medicine

## 2018-11-08 NOTE — Telephone Encounter (Signed)
Current dose is 4.5 mg Mon/Wed/Fri/Sat and 3 mg Tues/Thurs/Sun. Held 4.5 mg dose yesterday.

## 2018-11-08 NOTE — Telephone Encounter (Signed)
Patient aware and will recheck tomorrow

## 2018-11-08 NOTE — Telephone Encounter (Signed)
Do not take Coumadin today  Check INR again tomorrow  How much is she taking currently?   [place]

## 2018-11-08 NOTE — Telephone Encounter (Signed)
Hold dose today INR 4.0  Take 4.5 mg Mon/Weds/Friday 3 mg on Tues/Thurs/SAT/sunday not change from 4.5 mg on Saturday to 3.0 mg on Saturday   Check INR tomorrow and forward to on call provider for [place]   [place]

## 2018-11-08 NOTE — Telephone Encounter (Signed)
INR 4.0 today 11/08/18

## 2018-11-09 LAB — POCT INR: INR: 2.4 — AB (ref <=1.1)

## 2018-11-09 NOTE — Telephone Encounter (Signed)
Pt is aware and INR has been abstracted

## 2018-11-09 NOTE — Telephone Encounter (Signed)
Yes continue dosing as discussed. INR of 2.4 is at good range.

## 2018-11-09 NOTE — Telephone Encounter (Signed)
Spoke with pt. Her INR is 2.4 today. Fwding to Dr. Gayland Curry and Philis Nettle. Mailed food list to pt per Dr. Audrie Gallus request.

## 2018-11-09 NOTE — Telephone Encounter (Signed)
She can take the dose starting today as well discussed reduced dose yesterday   Thanks tMS

## 2018-11-09 NOTE — Telephone Encounter (Signed)
She sees Dr. Derrel Nip on 2/26. Will recheck INR then

## 2018-11-11 ENCOUNTER — Other Ambulatory Visit: Payer: Self-pay | Admitting: Internal Medicine

## 2018-11-14 ENCOUNTER — Ambulatory Visit (INDEPENDENT_AMBULATORY_CARE_PROVIDER_SITE_OTHER): Payer: Medicare Other

## 2018-11-14 ENCOUNTER — Ambulatory Visit: Payer: Medicare Other

## 2018-11-14 ENCOUNTER — Telehealth: Payer: Self-pay

## 2018-11-14 ENCOUNTER — Encounter: Payer: Self-pay | Admitting: Internal Medicine

## 2018-11-14 ENCOUNTER — Ambulatory Visit (INDEPENDENT_AMBULATORY_CARE_PROVIDER_SITE_OTHER): Payer: Medicare Other | Admitting: Internal Medicine

## 2018-11-14 VITALS — BP 114/62 | HR 101 | Temp 98.0°F | Resp 16 | Ht 61.0 in | Wt 109.0 lb

## 2018-11-14 DIAGNOSIS — M25551 Pain in right hip: Secondary | ICD-10-CM

## 2018-11-14 DIAGNOSIS — G8929 Other chronic pain: Secondary | ICD-10-CM

## 2018-11-14 DIAGNOSIS — M47817 Spondylosis without myelopathy or radiculopathy, lumbosacral region: Secondary | ICD-10-CM

## 2018-11-14 DIAGNOSIS — E034 Atrophy of thyroid (acquired): Secondary | ICD-10-CM

## 2018-11-14 DIAGNOSIS — Z9181 History of falling: Secondary | ICD-10-CM | POA: Diagnosis not present

## 2018-11-14 DIAGNOSIS — Z7901 Long term (current) use of anticoagulants: Secondary | ICD-10-CM | POA: Diagnosis not present

## 2018-11-14 DIAGNOSIS — M549 Dorsalgia, unspecified: Secondary | ICD-10-CM

## 2018-11-14 DIAGNOSIS — Z5181 Encounter for therapeutic drug level monitoring: Secondary | ICD-10-CM | POA: Diagnosis not present

## 2018-11-14 DIAGNOSIS — M1611 Unilateral primary osteoarthritis, right hip: Secondary | ICD-10-CM | POA: Diagnosis not present

## 2018-11-14 DIAGNOSIS — D508 Other iron deficiency anemias: Secondary | ICD-10-CM

## 2018-11-14 DIAGNOSIS — M48061 Spinal stenosis, lumbar region without neurogenic claudication: Secondary | ICD-10-CM | POA: Diagnosis not present

## 2018-11-14 DIAGNOSIS — Z9889 Other specified postprocedural states: Secondary | ICD-10-CM

## 2018-11-14 DIAGNOSIS — M47816 Spondylosis without myelopathy or radiculopathy, lumbar region: Secondary | ICD-10-CM | POA: Diagnosis not present

## 2018-11-14 DIAGNOSIS — E440 Moderate protein-calorie malnutrition: Secondary | ICD-10-CM

## 2018-11-14 LAB — POCT INR
INR: 2.5 — AB (ref 0.9–1.1)
INR: 2.5 — AB (ref <=1.1)

## 2018-11-14 MED ORDER — FENTANYL 25 MCG/HR TD PT72: 1.0000 | MEDICATED_PATCH | TRANSDERMAL | 0 refills | Status: DC

## 2018-11-14 MED ORDER — OXYCODONE-ACETAMINOPHEN 5-325 MG PO TABS: 1.0000 | ORAL_TABLET | Freq: Three times a day (TID) | ORAL | 0 refills | Status: DC | PRN

## 2018-11-14 MED ORDER — PROCHLORPERAZINE MALEATE 5 MG PO TABS: 5.0000 mg | ORAL_TABLET | Freq: Four times a day (QID) | ORAL | 0 refills | Status: DC | PRN

## 2018-11-14 NOTE — Telephone Encounter (Signed)
Coumadin level is therapeutic,  Continue current regimen and repeat PT/INR in one week 

## 2018-11-14 NOTE — Telephone Encounter (Signed)
Pt's INR for 11/14/2018 is 2.5. Pt is currently taking 4.5mg  Monday, Wednesday, Friday and 3mg  on Tuesday, Thursday, Saturday and Sunday.   INR has been abstracted.

## 2018-11-14 NOTE — Patient Instructions (Addendum)
Stop the bentyl  And carafate for a week Use phenergan and compazine for nausea   I am adding a fentanyl patch  25 mcg.  This is worn for 3 days to provide continuous pain management..  You will still need to use the percocet for the first 18 hours,  But if you start to feel drowsy,  DO NOT TAKE percocet.    Return in 2 weeks for adjustment of medications  X rays today

## 2018-11-14 NOTE — Progress Notes (Signed)
Subjective:  Patient ID: Diana [name], female    DOB: [birth year]  Age: 84 y.o. MRN: [id number]  CC: The primary encounter diagnosis was Chronic right-sided back pain, unspecified back location. Diagnoses of Chronic right hip pain, Facet arthritis of lumbosacral region, Iron deficiency anemia secondary to inadequate dietary iron intake, Moderate protein-calorie malnutrition (HCC), Hypothyroidism due to acquired atrophy of thyroid, History of fall within past 90 days, History of gastric surgery, and Anticoagulation goal of INR 2 to 3 were also pertinent to this visit.  HPI Diana [name] presents for follow up on chronic issues including nausea and joint pain.  Has been losing weight for the past 2 months due to persistent nausea.  She states that she wakes up nauseated and stays mildly nauseated until late afternoon when the nausea is worse.  She has been using phenergan bc zofran doesn't relieve her symptom.  She states that the dose of phenergan 12.5 mg  lasts 4 hours.  She is taking protonix bid, sucrlfate bid and more recently started remeron.  She has been evaluated repeatedly by GI and states that Dr. Marva Panda has "run out of options"  per patient and does not want to repeat an EGD due to the risk of complications from sedation. She has a history of H Pylori infection treated in 2009 and again in Dec 2019 which transiently improved her nausea for a few days .  She cannot afford Megace.   She has  lost 13 lbs since November visit.  2) she has been having persistent  right sided rib,  back and hip pain  since her fall several months ago. She has not had x rays. To rule out fractures.  She has severe scoliosis and spinal stenosis and her pain is not controlled any more on percocet used twice daily .    Lab Results  Component Value Date   INR 2.5 (A) 11/14/2018   INR 2.4 (A) 11/09/2018   INR 5.7 (A) 11/07/2018    Outpatient Medications Prior to Visit  Medication Sig Dispense Refill  .  ALPRAZolam (XANAX) 0.25 MG tablet Take 1 tablet (0.25 mg total) by mouth 2 (two) times daily as needed for anxiety. 20 tablet 5  . Ascorbic Acid (VITAMIN C) 1000 MG tablet Take 1,000 mg by mouth daily.    . Calcium Carbonate-Vitamin D 600-400 MG-UNIT tablet Take 1 tablet by mouth daily.    . cyanocobalamin (,VITAMIN B-12,) 1000 MCG/ML injection Inject 1 mL (1,000 mcg total) into the muscle every 30 (thirty) days. Pt uses on the 1st of every month. 10 mL 1  . hydroxychloroquine (PLAQUENIL) 200 MG tablet Take 200 mg by mouth daily.    Marland Kitchen letrozole (FEMARA) 2.5 MG tablet TAKE 1 TABLET BY MOUTH EVERY DAY 90 tablet 1  . levothyroxine (SYNTHROID, LEVOTHROID) 125 MCG tablet Take 1 tablet (125 mcg total) by mouth daily. 90 tablet 0  . lisinopril (PRINIVIL,ZESTRIL) 5 MG tablet TAKE 1 TABLET (5 MG TOTAL) BY MOUTH DAILY. 90 tablet 3  . metoCLOPramide (REGLAN) 10 MG tablet TAKE 1 TABLET BY MOUTH 4 TIMES DAILY - BEFORE MEALS AND AT BEDTIME. 360 tablet 0  . metoprolol succinate (TOPROL-XL) 25 MG 24 hr tablet Take 12.5 mg by mouth daily.     . mirtazapine (REMERON) 15 MG tablet 1/2 TO 1 TABLET 30 MINUTES BEFORE BEDTIME DAILY 90 tablet 0  . mycophenolate (CELLCEPT) 500 MG tablet Take 1,000 mg by mouth 2 (two) times daily.    . ondansetron (  ZOFRAN ODT) 4 MG disintegrating tablet Take 1 tablet (4 mg total) by mouth every 8 (eight) hours as needed for nausea or vomiting. 20 tablet 0  . OXYGEN Inhale 2 mLs into the lungs at bedtime.    . pantoprazole (PROTONIX) 40 MG tablet Take 40 mg by mouth 2 (two) times daily.    . Polyvinyl Alcohol-Povidone (REFRESH OP) Place 2 drops into both eyes daily as needed (dry eyes).     . predniSONE (DELTASONE) 5 MG tablet Take 5 mg by mouth daily.     . promethazine (PHENERGAN) 12.5 MG tablet TAKE 1 TABLET BY MOUTH EVERY 8 HOURS AS NEEDED FOR NAUSEA AND VOMITING 20 tablet 5  . Saccharomyces boulardii (PROBIOTIC) 250 MG CAPS Take 1 tablet by mouth daily. 14 capsule   . sucralfate  (CARAFATE) 1 GM/10ML suspension Take 1 g by mouth 4 (four) times daily. Before meals and nightly    . Syringe/Needle, Disp, (SYRINGE 3CC/25GX1") 25G X 1" 3 ML MISC Use for b12 injections 50 each 0  . Tadalafil, PAH, (ADCIRCA) 20 MG TABS Take 40 mg by mouth daily.    Marland Kitchen warfarin (COUMADIN) 3 MG tablet Take 1 tablet (3 mg total) by mouth daily. 90 tablet 0  . oxyCODONE-acetaminophen (PERCOCET/ROXICET) 5-325 MG tablet Take 1 tablet by mouth every 8 (eight) hours as needed for severe pain. 65 tablet 0   No facility-administered medications prior to visit.     Review of Systems;  Patient denies headache, fevers, malaise, unintentional weight loss, skin rash, eye pain, sinus congestion and sinus pain, sore throat, dysphagia,  hemoptysis , cough, dyspnea, wheezing, chest pain, palpitations, orthopnea, edema, abdominal pain, nausea, melena, diarrhea, constipation, flank pain, dysuria, hematuria, urinary  Frequency, nocturia, numbness, tingling, seizures,  Focal weakness, Loss of consciousness,  Tremor, insomnia, depression, anxiety, and suicidal ideation.      Objective:  BP 114/62 (BP Location: Right Arm, Patient Position: Sitting, Cuff Size: Normal)   Pulse (!) 101   Temp 98 F (36.7 C) (Oral)   Resp 16   Ht 5\' 1"  (1.549 m)   Wt 109 lb (49.4 kg)   SpO2 97%   BMI 20.60 kg/m   BP Readings from Last 3 Encounters:  11/14/18 114/62  10/31/18 100/62  10/18/18 (!) 112/56    Wt Readings from Last 3 Encounters:  11/14/18 109 lb (49.4 kg)  10/09/18 110 lb 9.6 oz (50.2 kg)  08/09/18 116 lb 3.2 oz (52.7 kg)    General appearance: alert, cooperative and appears stated age Ears: normal TM's and external ear canals both ears Throat: lips, mucosa, and tongue normal; teeth and gums normal Neck: no adenopathy, no carotid bruit, supple, symmetrical, trachea midline and thyroid not enlarged, symmetric, no tenderness/mass/nodules Back: symmetric, no curvature. ROM normal. No CVA tenderness. Lungs:  clear to auscultation bilaterally Heart: regular rate and rhythm, S1, S2 normal, no murmur, click, rub or gallop Abdomen: soft, non-tender; bowel sounds normal; no masses,  no organomegaly Pulses: 2+ and symmetric Skin: Skin color, texture, turgor normal. No rashes or lesions Lymph nodes: Cervical, supraclavicular, and axillary nodes normal.  Lab Results  Component Value Date   HGBA1C 6.4 08/09/2018   HGBA1C 6.4 02/05/2018   HGBA1C 6.2 10/18/2017    Lab Results  Component Value Date   CREATININE 1.47 (H) 10/09/2018   CREATININE 1.71 (H) 06/28/2018   CREATININE 1.49 (H) 06/20/2018    Lab Results  Component Value Date   WBC 5.6 10/09/2018   HGB 8.1 (L) 10/09/2018  HCT 27.5 (L) 10/09/2018   PLT 315 10/09/2018   GLUCOSE 175 (H) 10/09/2018   CHOL 164 10/11/2013   TRIG 152.0 (H) 10/11/2013   HDL 69.70 10/11/2013   LDLDIRECT 50.0 05/09/2016   LDLCALC 64 10/11/2013   ALT 7 10/09/2018   AST 19 10/09/2018   NA 144 10/09/2018   K 4.2 10/09/2018   CL 112 (H) 10/09/2018   CREATININE 1.47 (H) 10/09/2018   BUN 28 (H) 10/09/2018   CO2 24 10/09/2018   TSH 0.105 (L) 10/09/2018   INR 2.5 (A) 11/14/2018   HGBA1C 6.4 08/09/2018   MICROALBUR 8.7 (H) 09/14/2016    Mm Diag Breast Tomo Bilateral  Result Date: 10/08/2018 CLINICAL DATA:  History of left breast cancer status post lumpectomy in 2015. EXAM: DIGITAL DIAGNOSTIC BILATERAL MAMMOGRAM WITH CAD AND TOMO COMPARISON:  Previous exam(s). ACR Breast Density Category d: The breast tissue is extremely dense, which lowers the sensitivity of mammography. FINDINGS: Stable lumpectomy changes are seen in the left breast. No suspicious mass, malignant type microcalcifications or distortion detected in either breast. Mammographic images were processed with CAD. IMPRESSION: No evidence of malignancy in either breast. RECOMMENDATION: Bilateral diagnostic mammogram in 1 year is recommended. I have discussed the findings and recommendations with the  patient. Results were also provided in writing at the conclusion of the visit. If applicable, a reminder letter will be sent to the patient regarding the next appointment. BI-RADS CATEGORY  2: Benign. Electronically Signed   By: Baird Lyons M.D.   On: 10/08/2018 11:13    Assessment & Plan:   Problem List Items Addressed This Visit    Protein-calorie malnutrition (HCC)    Worsening, secondary to chronic nausea of uncertain etiology.  She has a complicated GI history including (most recently): historyof volvulus requiring repair, and severe candida esophagitis requiring use of G tube,  And H pylori gastritis, which was treated remotely in 2009  And again in December 2019        Hypothyroidism    TSH was suppressed in January and dose was reduced to 125 mcg in late January.  Repeat TSH is due in mid March   Lab Results  Component Value Date   TSH 0.105 (L) 10/09/2018         History of gastric surgery   History of fall within past 90 days    Lumbar,  Hip and rib films were done today to rule out fractures due to persistent pain in the setting of osteoporosis and recent fall.  No fractures were noted.       Facet arthritis of lumbosacral region    Her pain is persistent despite ESI and nerve blocks and has been complicated by vertebral fracture,  It is not controlled currently with a maximum dose of 2  Percocet  daily.  We are adding low dose Fentanyl patches with the intent of stopping Percocet at some point when her pain is well controlled.       Relevant Medications   oxyCODONE-acetaminophen (PERCOCET/ROXICET) 5-325 MG tablet   fentaNYL (DURAGESIC) 25 MCG/HR   Chronic right hip pain    Repeat films done today due to acute on chronic pain  to rule out fracture show mild  degenerative changes but no fracture       Relevant Medications   oxyCODONE-acetaminophen (PERCOCET/ROXICET) 5-325 MG tablet   fentaNYL (DURAGESIC) 25 MCG/HR   Other Relevant Orders   DG HIP UNILAT WITH PELVIS  2-3 VIEWS RIGHT (Completed)   Anticoagulation  goal of INR 2 to 3    INR is therapeutic on regimen of warfarin 4.5 mg alternating with 3 mg  (25.5 to 27 mg weekly)  Lab Results  Component Value Date   INR 2.5 (A) 11/14/2018   INR 2.4 (A) 11/09/2018   INR 5.7 (A) 11/07/2018         Anemia, iron deficiency    Chronic,  Persistent,  Multifactorial Both iron and b12 deficient by prior labs.  . Historically improved with iron infusions, which have been resumed due to recent drop.  Lab Results  Component Value Date   WBC 5.6 10/09/2018   HGB 8.1 (L) 10/09/2018   HCT 27.5 (L) 10/09/2018   MCV 100.7 (H) 10/09/2018   PLT 315 10/09/2018          Other Visit Diagnoses    Chronic right-sided back pain, unspecified back location    -  Primary   Relevant Medications   oxyCODONE-acetaminophen (PERCOCET/ROXICET) 5-325 MG tablet   fentaNYL (DURAGESIC) 25 MCG/HR   Other Relevant Orders   DG Lumbar Spine Complete (Completed)      I am having Diana [name] start on prochlorperazine. I am also having her maintain her hydroxychloroquine, tadalafil (PAH), predniSONE, metoprolol succinate, mycophenolate, Polyvinyl Alcohol-Povidone (REFRESH OP), OXYGEN, Calcium Carbonate-Vitamin D, pantoprazole, cyanocobalamin, SYRINGE 3CC/25GX1", vitamin C, sucralfate, ondansetron, Probiotic, lisinopril, metoCLOPramide, ALPRAZolam, letrozole, promethazine, levothyroxine, warfarin, mirtazapine, oxyCODONE-acetaminophen, and fentaNYL.  Meds ordered this encounter  Medications  . prochlorperazine (COMPAZINE) 5 MG tablet    Sig: Take 1 tablet (5 mg total) by mouth every 6 (six) hours as needed for nausea or vomiting.    Dispense:  30 tablet    Refill:  0  . DISCONTD: fentaNYL (DURAGESIC) 25 MCG/HR    Sig: Place 1 patch onto the skin every 3 (three) days.    Dispense:  5 patch    Refill:  0  . DISCONTD: oxyCODONE-acetaminophen (PERCOCET/ROXICET) 5-325 MG tablet    Sig: Take 1 tablet by mouth every 8 (eight)  hours as needed for severe pain.    Dispense:  65 tablet    Refill:  0  . oxyCODONE-acetaminophen (PERCOCET/ROXICET) 5-325 MG tablet    Sig: Take 1 tablet by mouth every 8 (eight) hours as needed for severe pain.    Dispense:  65 tablet    Refill:  0  . fentaNYL (DURAGESIC) 25 MCG/HR    Sig: Place 1 patch onto the skin every 3 (three) days.    Dispense:  5 patch    Refill:  0    Medications Discontinued During This Encounter  Medication Reason  . oxyCODONE-acetaminophen (PERCOCET/ROXICET) 5-325 MG tablet Reorder  . oxyCODONE-acetaminophen (PERCOCET/ROXICET) 5-325 MG tablet   . fentaNYL (DURAGESIC) 25 MCG/HR   A total of 40 minutes was spent with patient more than half of which was spent in counseling patient on the above mentioned issues , reviewing and explaining recent labs and imaging studies done, and coordination of care.   Follow-up: Return in about 2 weeks (around 11/28/2018) for pain management.   Sherlene Shams, MD

## 2018-11-15 NOTE — Telephone Encounter (Signed)
Spoke with pt and informed her that her INR the therapeutic and that she needs to continue her current medication regimen. Pt gave a verbal understanding.

## 2018-11-17 ENCOUNTER — Encounter: Payer: Self-pay | Admitting: Internal Medicine

## 2018-11-17 NOTE — Assessment & Plan Note (Signed)
TSH was suppressed in January and dose was reduced to 125 mcg in late January.  Repeat TSH is due in mid March   Lab Results  Component Value Date   TSH 0.105 (L) 10/09/2018

## 2018-11-17 NOTE — Assessment & Plan Note (Signed)
INR is therapeutic on regimen of warfarin 4.5 mg alternating with 3 mg  (25.5 to 27 mg weekly)  Lab Results  Component Value Date   INR 2.5 (A) 11/14/2018   INR 2.4 (A) 11/09/2018   INR 5.7 (A) 11/07/2018

## 2018-11-17 NOTE — Assessment & Plan Note (Addendum)
Repeat films done today due to acute on chronic pain  to rule out fracture show mild  degenerative changes but no fracture

## 2018-11-17 NOTE — Assessment & Plan Note (Signed)
Her pain is persistent despite ESI and nerve blocks and has been complicated by vertebral fracture,  It is not controlled currently with a maximum dose of 2  Percocet  daily.  We are adding low dose Fentanyl patches with the intent of stopping Percocet at some point when her pain is well controlled.

## 2018-11-17 NOTE — Assessment & Plan Note (Addendum)
Worsening, secondary to chronic nausea of uncertain etiology.  She has a complicated GI history including (most recently): historyof volvulus requiring repair, and severe candida esophagitis requiring use of G tube,  And H pylori gastritis, which was treated remotely in 2009  And again in December 2019

## 2018-11-17 NOTE — Assessment & Plan Note (Signed)
Lumbar,  Hip and rib films were done today to rule out fractures due to persistent pain in the setting of osteoporosis and recent fall.  No fractures were noted.

## 2018-11-17 NOTE — Assessment & Plan Note (Signed)
Chronic,  Persistent,  Multifactorial Both iron and b12 deficient by prior labs.  . Historically improved with iron infusions, which have been resumed due to recent drop.  Lab Results  Component Value Date   WBC 5.6 10/09/2018   HGB 8.1 (L) 10/09/2018   HCT 27.5 (L) 10/09/2018   MCV 100.7 (H) 10/09/2018   PLT 315 10/09/2018

## 2018-11-17 NOTE — Assessment & Plan Note (Signed)
Now managed with Letrozole

## 2018-11-21 ENCOUNTER — Telehealth: Payer: Self-pay

## 2018-11-21 ENCOUNTER — Encounter: Payer: Self-pay | Admitting: Internal Medicine

## 2018-11-21 ENCOUNTER — Other Ambulatory Visit: Payer: Medicare Other

## 2018-11-21 DIAGNOSIS — Z7901 Long term (current) use of anticoagulants: Secondary | ICD-10-CM | POA: Diagnosis not present

## 2018-11-21 LAB — PROTIME-INR

## 2018-11-21 NOTE — Telephone Encounter (Signed)
Spoke with pt and informed her that her INR is too high and that she needs to take 3mg  daily. Pt gave a verbal understanding.

## 2018-11-21 NOTE — Telephone Encounter (Signed)
INR is high   Reduce dose to 3 mg daily and repeat in one week

## 2018-11-21 NOTE — Telephone Encounter (Signed)
Copied from [place] [id number]. Topic: General - Other >> Nov 21, 2018  4:07 PM Alanda Slim E wrote: Reason for CRM: [employer] with [employer] called to report the Pt's INR was 3.5 and tested today

## 2018-11-21 NOTE — Telephone Encounter (Signed)
Pt's INR 3.5.  Verified through Juliann Pulse that pt taking 4.5 mg M,W,F and 3 mg all other days.

## 2018-11-24 DIAGNOSIS — M3214 Glomerular disease in systemic lupus erythematosus: Principal | ICD-10-CM

## 2018-11-25 ENCOUNTER — Telehealth: Payer: Self-pay | Admitting: Internal Medicine

## 2018-11-26 ENCOUNTER — Other Ambulatory Visit: Payer: Self-pay | Admitting: Internal Medicine

## 2018-11-26 NOTE — Telephone Encounter (Signed)
She cannot refill the duragesic patch until march 26 , why is she asking for an early refill?

## 2018-11-26 NOTE — Telephone Encounter (Signed)
Refilled: 11/14/2018 Last OV: 11/14/2018 Next OV: 12/12/2018

## 2018-11-26 NOTE — Telephone Encounter (Signed)
Copied from [place] [phone]. Topic: Quick Communication - Rx Refill/Question >> Nov 26, 2018  1:08 PM Blase Mess A wrote: Medication: fentaNYL ([place]) [place] [[id number]] -apt 12/12/18  Has the patient contacted their pharmacy? Yes (Agent: If no, request that the patient contact the pharmacy for the refill.) (Agent: If yes, when and what did the pharmacy advise?)  Preferred Pharmacy (with phone number or street name): [id number] [address], [place] [phone] (Phone) [phone] (Fax)    Agent: Please be advised that RX refills may take up to 3 business days. We ask that you follow-up with your pharmacy.

## 2018-11-27 MED ORDER — FENTANYL 25 MCG/HR TD PT72: 1.0000 | MEDICATED_PATCH | TRANSDERMAL | 0 refills | Status: DC

## 2018-11-27 NOTE — Telephone Encounter (Signed)
Spoke with pt to make sure that she was using the patches correctly and pt stated that she is placing one patch on every three days. The pt stated that she using only prescribed 5 patches and is placing her last one on today.

## 2018-11-28 ENCOUNTER — Telehealth: Payer: Self-pay | Admitting: Internal Medicine

## 2018-11-28 DIAGNOSIS — Z5181 Encounter for therapeutic drug level monitoring: Secondary | ICD-10-CM

## 2018-11-28 DIAGNOSIS — Z7901 Long term (current) use of anticoagulants: Principal | ICD-10-CM

## 2018-11-28 LAB — POCT INR: INR: 1.8 — AB (ref 0.9–1.1)

## 2018-11-28 MED ORDER — PREDNISONE 5 MG TABLET
ORAL_TABLET | 5 refills | 0 days | Status: CP
Start: 2018-11-28 — End: 2019-01-15

## 2018-11-28 NOTE — Telephone Encounter (Signed)
'  Caren Griffins' from [facility] calling to report INR of 1.8 from today.  States copy of result faxed to practice.

## 2018-11-29 NOTE — Assessment & Plan Note (Signed)
INR 1.8 on 3 mg daily,  Increase dose to 6 mg on Thursday and Sunday,  3 mg all other days,  Repeat 1 week

## 2018-11-29 NOTE — Telephone Encounter (Signed)
Have her increase dose to 6 mg of coumadin TONIGHT  (Thursday  And Sundays,  Continue 3 mg all other days.  Repeat INR in one week

## 2018-11-29 NOTE — Telephone Encounter (Signed)
Pt's INR is 1.8. Pt is currently taking 3mg  daily. INR has been abstracted.

## 2018-11-29 NOTE — Telephone Encounter (Signed)
Spoke with pt and informed her of her coumadin dose change. Pt gave a verbal understanding.

## 2018-12-06 ENCOUNTER — Encounter: Payer: Self-pay | Admitting: Internal Medicine

## 2018-12-06 ENCOUNTER — Telehealth: Payer: Self-pay

## 2018-12-06 ENCOUNTER — Other Ambulatory Visit: Payer: Self-pay | Admitting: Internal Medicine

## 2018-12-06 LAB — PROTIME-INR

## 2018-12-06 NOTE — Telephone Encounter (Signed)
Spoke with pt and informed her of her INR result and that she needs to continue taking her current coumadin regimen. Pt gave a verbal understanding.

## 2018-12-06 NOTE — Telephone Encounter (Signed)
Coumadin level is therapeutic,  Continue current regimen and repeat PT/INR in one week 

## 2018-12-06 NOTE — Telephone Encounter (Signed)
INR result for 12/06/2018 is 2.2. Pt is currently taking 6mg  Thursday and Sundays and 3mg  all other days.

## 2018-12-12 ENCOUNTER — Ambulatory Visit: Payer: Medicare Other | Admitting: Internal Medicine

## 2018-12-12 ENCOUNTER — Other Ambulatory Visit: Payer: Self-pay

## 2018-12-12 ENCOUNTER — Telehealth (INDEPENDENT_AMBULATORY_CARE_PROVIDER_SITE_OTHER): Payer: Medicare Other | Admitting: Internal Medicine

## 2018-12-12 DIAGNOSIS — R1032 Left lower quadrant pain: Secondary | ICD-10-CM

## 2018-12-12 DIAGNOSIS — Z5181 Encounter for therapeutic drug level monitoring: Secondary | ICD-10-CM | POA: Diagnosis not present

## 2018-12-12 DIAGNOSIS — G8929 Other chronic pain: Secondary | ICD-10-CM | POA: Diagnosis not present

## 2018-12-12 DIAGNOSIS — Z7901 Long term (current) use of anticoagulants: Secondary | ICD-10-CM

## 2018-12-12 DIAGNOSIS — E038 Other specified hypothyroidism: Secondary | ICD-10-CM | POA: Diagnosis not present

## 2018-12-12 DIAGNOSIS — M5430 Sciatica, unspecified side: Secondary | ICD-10-CM

## 2018-12-12 LAB — POCT INR: INR: 2.6 — AB (ref <=1.1)

## 2018-12-12 MED ORDER — FENTANYL 50 MCG/HR TD PT72: 1.0000 | MEDICATED_PATCH | TRANSDERMAL | 0 refills | Status: DC

## 2018-12-12 MED ORDER — LACTULOSE 20 GM/30ML PO SOLN: ORAL | 3 refills | Status: DC

## 2018-12-12 NOTE — Progress Notes (Signed)
Virtual Visit via Video Note  I connected with@ on 12/13/18 at  3:00 PM EDT by a video enabled telemedicine application and verified that I am speaking with the correct person using two identifiers.  Location patient: home Location provider:work or home office Persons participating in the virtual visit: patient, provider  I discussed the limitations of evaluation and management by telemedicine and the availability of in person appointments. The patient expressed understanding and agreed to proceed.   HPI:   [name] is an 84 yr old female with a history of SLE, breast cancer, recurrent DVT , osteoporosis with history of vertebral compression fracture ,and history  of volvulus requiring gastric surgery , diverticulosis with recurrent abd pain attributed to IBS by GI.  Who is scheduled follow up on chronic issues and was contacted by phone.   She reports that she doesn't feel well. She has been having lower abdominal pain that accompanies bowel movements of small caliber non bloody solid stools occurring 3 to 4 times daily .  She denies nausea and fevers .she has occasional loose stools.  She is taking  Her medications as directed, including protonix,  reglan and sucralfate .  She has a tortuous colon and diverticulosis.  Chronic pain: she has been using the Fentanyl patch for the last 2 weeks with improved pain management  But continues to need oxycodone in the evening,  Which she feels she does not tolerate.  She Is requesting an increase in the fentanyl patch so that she may stop the oxycodone ROS: See pertinent positives and negatives per HPI.  Past Medical History:  Diagnosis Date  . Acute glomerulonephritis with other specified pathological lesion in kidney in disease classified elsewhere(580.81)   . Alveolar aeration decreased   . Anemia   . Arthritis   . Atrial fibrillation ([place])   . Breast cancer ([place]) 06/03/2014   positive, radiation  . Cancer (HCC)    Breast  . CHF (congestive  heart failure) ([place])   . Diaphragmatic hernia   . DVT (deep venous thrombosis) ([place])   . Dysrhythmia   . Environmental allergies   . Erosive esophagitis 03/28/2015   esophageal biopsy July 2017 c/w CMV cytopathic changes, negative for H Pylori and dysplasia     . Esophageal reflux   . Esophagitis, CMV ([place]) 05/26/2015   Diagnosed at [facility] with manometry,  Tube feeds started for enteral nutrtion 05/12/15   . Feeding problem    FEEDING TUBE  . Hiatal hernia   . Hyperlipidemia   . Hypertension   . Lower extremity edema   . Lupus (systemic lupus erythematosus) ([place])   . Neuropathy   . Osteopenia   . Oxygen deficiency    USES HS  . Peripheral neuropathy, hereditary/idiopathic   . Personal history of radiation therapy   . Pneumonia   . Primary pulmonary hypertension ([place])   . Pulmonary hypertension ([place])   . Renal insufficiency   . Sciatica   . Shingles   . Shingles (herpes zoster) polyneuropathy March 2013   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  . Shortness of breath dyspnea   . Unspecified hypothyroidism   . Unspecified menopausal and postmenopausal disorder   . Urinary incontinence without sensory awareness   . Vitamin B deficiency     Past Surgical History:  Procedure Laterality Date  . BREAST BIOPSY Left 2015   invasive mammary carcinoma  . BREAST LUMPECTOMY Left 2015   invasive mammary carcinoma clear margins but close  . BREAST SURGERY    .  CATARACT EXTRACTION W/PHACO Right 04/05/2016   Procedure: CATARACT EXTRACTION PHACO AND INTRAOCULAR LENS PLACEMENT (IOC);  Surgeon: Birder Robson, MD;  Location: [facility] ORS;  Service: Ophthalmology;  Laterality: Right;  [place] 01:20 AP% 23.9 CDE 19.29 fluid pack lot # [id number]  . CATARACT EXTRACTION W/PHACO Left 04/26/2016   Procedure: CATARACT EXTRACTION PHACO AND INTRAOCULAR LENS PLACEMENT ([place]);  Surgeon: Birder Robson, MD;  Location: [facility];  Service: Ophthalmology;  Laterality: Left;   [place] 00:59 AP% 23.2 CDE 13.83 Fluid pack lot #  [id number]  . COLONOSCOPY WITH PROPOFOL N/A 06/04/2018   Procedure: COLONOSCOPY WITH PROPOFOL;  Surgeon: Lollie Sails, MD;  [facility] END[facility];  Service: Endoscopy;  Laterality: N/A;  . DIAGNOSTIC LAPAROSCOPY    . ESOPHAGOGASTRODUODENOSCOPY N/A 07/19/2015   Procedure: ESOPHAGOGASTRODUODENOSCOPY (EGD);  Surgeon: Hulen Luster, MD;  Location: [facility] ENDOSCOPY;  Service: Gastroenterology;  Laterality: N/A;  . ESOPHAGOGASTRODUODENOSCOPY (EGD) WITH PROPOFOL N/A 03/28/2015   Procedure: ESOPHAGOGASTRODUODENOSCOPY (EGD) WITH PROPOFOL;  Surgeon: Robert Bellow, MD;  Location: [facility] ENDOSCOPY;  Service: Endoscopy;  Laterality: N/A;  . ESOPHAGOGASTRODUODENOSCOPY (EGD) WITH PROPOFOL N/A 06/10/2015   Procedure: ESOPHAGOGASTRODUODENOSCOPY (EGD) WITH PROPOFOL;  Surgeon: Lollie Sails, MD;  Location: [facility] ENDOSCOPY;  Service: Endoscopy;  Laterality: N/A;  . ESOPHAGOGASTRODUODENOSCOPY (EGD) WITH PROPOFOL N/A 10/20/2015   Procedure: ESOPHAGOGASTRODUODENOSCOPY (EGD) WITH PROPOFOL;  Surgeon: Lollie Sails, MD;  Location: [facility] ENDOSCOPY;  Service: Endoscopy;  Laterality: N/A;  . ESOPHAGOGASTRODUODENOSCOPY (EGD) WITH PROPOFOL N/A 02/23/2016   Procedure: ESOPHAGOGASTRODUODENOSCOPY (EGD) WITH PROPOFOL;  Surgeon: Lollie Sails, MD;  Location: [facility] ENDOSCOPY;  Service: Endoscopy;  Laterality: N/A;  . ESOPHAGOGASTRODUODENOSCOPY (EGD) WITH PROPOFOL N/A 06/30/2016   Procedure: ESOPHAGOGASTRODUODENOSCOPY (EGD) WITH PROPOFOL;  Surgeon: Lollie Sails, MD;  Location: [facility] ENDOSCOPY;  Service: Endoscopy;  Laterality: N/A;  . ESOPHAGOGASTRODUODENOSCOPY (EGD) WITH PROPOFOL N/A 01/05/2017   Procedure: ESOPHAGOGASTRODUODENOSCOPY (EGD) WITH PROPOFOL;  Surgeon: Lollie Sails, MD;  Location: [facility] ENDOSCOPY;  Service: Endoscopy;  Laterality: N/A;  . KYPHOPLASTY N/A 02/10/2015   Procedure: EYCXKGYJEHU/D1;  Surgeon: Hessie Knows, MD;  Location: [facility] ORS;  Service: Orthopedics;  Laterality: N/A;  . LAPAROTOMY N/A 03/11/2015    Procedure: REDUCTION OF GASTRIC VOLVULUS, SPLEENECTOMY, GASTRIC TUBE PLACEMENT;  Surgeon: Robert Bellow, MD;  Location: [facility] ORS;  Service: General;  Laterality: N/A;  . MOHS SURGERY    . PERIPHERAL VASCULAR CATHETERIZATION N/A 06/12/2015   Procedure: IVC Filter Insertion;  Surgeon: Algernon Huxley, MD;  Location: [place] [facility];  Service: Cardiovascular;  Laterality: N/A;  . PERIPHERAL VASCULAR CATHETERIZATION N/A 08/23/2016   Procedure: IVC Filter Removal;  Surgeon: Katha Cabal, MD;  Location: [place] [facility];  Service: Cardiovascular;  Laterality: N/A;  . STOMACH SURGERY     for volvolus    Family History  Problem Relation Age of Onset  . Colon cancer Mother   . Cirrhosis Brother        alcoholic  . Alcohol abuse Sister   . Breast cancer Sister 53  . Cancer Brother   . Lung cancer Son   . Meniere's disease Son   . Cirrhosis Brother        nonalcoholic     SOCIAL HX: retired Therapist, sports, lives with son    Current Outpatient Medications:  .  ALPRAZolam (XANAX) 0.25 MG tablet, Take 1 tablet (0.25 mg total) by mouth 2 (two) times daily as needed for anxiety., Disp: 20 tablet, Rfl: 5 .  Ascorbic Acid (VITAMIN C) 1000 MG tablet, Take 1,000 mg  by mouth daily., Disp: , Rfl:  .  Calcium Carbonate-Vitamin D 600-400 MG-UNIT tablet, Take 1 tablet by mouth daily., Disp: , Rfl:  .  cyanocobalamin (,VITAMIN B-12,) 1000 MCG/ML injection, Inject 1 mL (1,000 mcg total) into the muscle every 30 (thirty) days. Pt uses on the 1st of every month., Disp: 10 mL, Rfl: 1 .  fentaNYL (DURAGESIC) 50 MCG/HR, Place 1 patch onto the skin every 3 (three) days., Disp: 10 patch, Rfl: 0 .  hydroxychloroquine (PLAQUENIL) 200 MG tablet, Take 200 mg by mouth daily., Disp: , Rfl:  .  Lactulose 20 GM/30ML SOLN, 30 ml every 4 hours until constipation is relieved, Disp: 236 mL, Rfl: 3 .  letrozole (FEMARA) 2.5 MG tablet, TAKE 1 TABLET BY MOUTH EVERY DAY, Disp: 90 tablet, Rfl: 1 .  levothyroxine (SYNTHROID,  LEVOTHROID) 125 MCG tablet, Take 1 tablet (125 mcg total) by mouth daily., Disp: 90 tablet, Rfl: 0 .  lisinopril (PRINIVIL,ZESTRIL) 5 MG tablet, TAKE 1 TABLET (5 MG TOTAL) BY MOUTH DAILY., Disp: 90 tablet, Rfl: 3 .  metoCLOPramide (REGLAN) 10 MG tablet, TAKE 1 TABLET BY MOUTH 4 TIMES DAILY - BEFORE MEALS AND AT BEDTIME., Disp: 360 tablet, Rfl: 0 .  metoprolol succinate (TOPROL-XL) 25 MG 24 hr tablet, Take 12.5 mg by mouth daily. , Disp: , Rfl:  .  mirtazapine (REMERON) 15 MG tablet, 1/2 TO 1 TABLET 30 MINUTES BEFORE BEDTIME DAILY, Disp: 90 tablet, Rfl: 0 .  mycophenolate (CELLCEPT) 500 MG tablet, Take 1,000 mg by mouth 2 (two) times daily., Disp: , Rfl:  .  ondansetron (ZOFRAN ODT) 4 MG disintegrating tablet, Take 1 tablet (4 mg total) by mouth every 8 (eight) hours as needed for nausea or vomiting., Disp: 20 tablet, Rfl: 0 .  oxyCODONE-acetaminophen (PERCOCET/ROXICET) 5-325 MG tablet, Take 1 tablet by mouth every 8 (eight) hours as needed for severe pain., Disp: 65 tablet, Rfl: 0 .  OXYGEN, Inhale 2 mLs into the lungs at bedtime., Disp: , Rfl:  .  pantoprazole (PROTONIX) 40 MG tablet, Take 40 mg by mouth 2 (two) times daily., Disp: , Rfl:  .  Polyvinyl Alcohol-Povidone (REFRESH OP), Place 2 drops into both eyes daily as needed (dry eyes). , Disp: , Rfl:  .  predniSONE (DELTASONE) 5 MG tablet, Take 5 mg by mouth daily. , Disp: , Rfl:  .  prochlorperazine (COMPAZINE) 5 MG tablet, Take 1 tablet (5 mg total) by mouth every 6 (six) hours as needed for nausea or vomiting., Disp: 30 tablet, Rfl: 0 .  promethazine (PHENERGAN) 12.5 MG tablet, TAKE 1 TABLET BY MOUTH EVERY 8 HOURS AS NEEDED FOR NAUSEA AND VOMITING, Disp: 20 tablet, Rfl: 5 .  Saccharomyces boulardii (PROBIOTIC) 250 MG CAPS, Take 1 tablet by mouth daily., Disp: 14 capsule, Rfl:  .  sucralfate (CARAFATE) 1 GM/10ML suspension, Take 1 g by mouth 4 (four) times daily. Before meals and nightly, Disp: , Rfl:  .  SYNTHROID 137 MCG tablet, TAKE 1 TABLET  BY MOUTH EVERY DAY, Disp: 90 tablet, Rfl: 0 .  Syringe/Needle, Disp, (SYRINGE 3CC/25GX1") 25G X 1" 3 ML MISC, Use for b12 injections, Disp: 50 each, Rfl: 0 .  Tadalafil, PAH, (ADCIRCA) 20 MG TABS, Take 40 mg by mouth daily., Disp: , Rfl:  .  warfarin (COUMADIN) 3 MG tablet, Take 1 tablet (3 mg total) by mouth daily., Disp: 90 tablet, Rfl: 0  EXAM:   LUNGS: , no obvious gross SOB, gasping or wheezing   PSYCH/NEURO: pleasant and cooperative, no obvious  depression or anxiety, speech and thought processing grossly intact  ASSESSMENT AND PLAN:  Discussed the following assessment and plan:  Other specified hypothyroidism  Anticoagulation goal of INR 2 to 3  Abdominal pain, chronic, left lower quadrant  Sciatica, unspecified laterality     I discussed the assessment and treatment plan with the patient. The patient was provided an opportunity to ask questions and all were answered. The patient agreed with the plan and demonstrated an understanding of the instructions.   The patient was advised to call back or seek an in-person evaluation if the symptoms worsen or if the condition fails to improve as anticipated.  I provided 25 minutes of non-face-to-face time during this encounter.   Crecencio Mc, MD

## 2018-12-13 ENCOUNTER — Other Ambulatory Visit: Payer: Medicare Other

## 2018-12-13 ENCOUNTER — Ambulatory Visit: Payer: Medicare Other

## 2018-12-13 ENCOUNTER — Ambulatory Visit: Payer: Medicare Other | Admitting: Oncology

## 2018-12-13 DIAGNOSIS — G8929 Other chronic pain: Secondary | ICD-10-CM | POA: Insufficient documentation

## 2018-12-13 DIAGNOSIS — R1032 Left lower quadrant pain: Secondary | ICD-10-CM

## 2018-12-13 NOTE — Assessment & Plan Note (Signed)
TSH was suppressed in January and dose was reduced to 125 mcg in late January.  Repeat TSH is due in mid March   Lab Results  Component Value Date   TSH 0.105 (L) 10/09/2018

## 2018-12-13 NOTE — Assessment & Plan Note (Signed)
Her pain is persistent despite ESI and nerve blocks and has been complicated by vertebral fracture,  It was not controlled currently with a maximum dose of 2  Percocet  daily.  We are increasing the  low dose Fentanyl patches to 50 mcg daily with the intent of stopping Percocet at some point when her pain is well controlled.

## 2018-12-13 NOTE — Assessment & Plan Note (Signed)
INR is > 2 on regimen of  6 mg on Thursday and Sunday,  3 mg all other days,  Repeat 1 week  Lab Results  Component Value Date   INR 2.6 (A) 12/12/2018   INR 1.8 (A) 11/28/2018   INR 2.5 (A) 11/14/2018   INR 2.5 (A) 11/14/2018

## 2018-12-13 NOTE — Assessment & Plan Note (Signed)
Etiology unclear despite GI evaluation.  Bowel pattern suggests constipation ,  Less likely diverticulitis .  Trial of Lactulose

## 2018-12-14 ENCOUNTER — Ambulatory Visit: Payer: Medicare Other | Admitting: Oncology

## 2018-12-14 ENCOUNTER — Other Ambulatory Visit: Payer: Medicare Other

## 2018-12-14 ENCOUNTER — Ambulatory Visit: Payer: Medicare Other

## 2018-12-19 DIAGNOSIS — Z7901 Long term (current) use of anticoagulants: Secondary | ICD-10-CM | POA: Diagnosis not present

## 2018-12-19 LAB — PROTIME-INR: INR: 1.8 — AB (ref 0.9–1.1)

## 2018-12-20 ENCOUNTER — Telehealth: Payer: Self-pay

## 2018-12-20 DIAGNOSIS — Z7901 Long term (current) use of anticoagulants: Secondary | ICD-10-CM

## 2018-12-20 NOTE — Telephone Encounter (Signed)
Pt gave verbal understanding and will increase dose as directed below

## 2018-12-20 NOTE — Assessment & Plan Note (Signed)
INR is low at 1.8 on  27 mg weekly cumulative dose,  Will increase to 30 mg weekly and change  COUMADIN REGIMEN TO AS FOLLOWS:  6 mg on Tues Thurs and Saturdays 3 mg on Mon Wed , Friday and Sunday  Repeat INR one week

## 2018-12-20 NOTE — Telephone Encounter (Signed)
INR is low at 1.8 on  27 mg weekly cumulative dose,  Will increase to 30 mg weekly and change  COUMADIN REGIMEN TO AS FOLLOWS:  6 mg on Tues Thurs and Saturdays 3 mg on Mon Wed , Friday and Sunday  Repeat INR one week

## 2018-12-20 NOTE — Telephone Encounter (Signed)
INR result from 12/19/2018 is 1.8. Pt is currently taking 6mg  on Thursday and Sunday and 3mg  all other days.

## 2018-12-26 LAB — POCT INR: INR: 2.1 — AB (ref 0.9–1.1)

## 2018-12-27 ENCOUNTER — Other Ambulatory Visit: Payer: Medicare Other

## 2018-12-27 ENCOUNTER — Ambulatory Visit: Payer: Medicare Other | Admitting: Oncology

## 2018-12-31 ENCOUNTER — Telehealth: Payer: Self-pay

## 2018-12-31 NOTE — Telephone Encounter (Signed)
INR from 12/26/2018 is 2.1.  Pt is currently taking 6mg  on Tuesday, Thursday, and Saturday and 3mg  on Monday, Wednesday, Friday and Sunday.

## 2018-12-31 NOTE — Telephone Encounter (Signed)
Spoke with pt and informed her of her INR results and that she needs to continue her current coumadin regimen.

## 2018-12-31 NOTE — Telephone Encounter (Signed)
Coumadin level is therapeutic,  Continue current regimen and repeat PT/INR in one week 

## 2019-01-03 ENCOUNTER — Telehealth: Payer: Self-pay | Admitting: Internal Medicine

## 2019-01-03 MED ORDER — MYCOPHENOLATE MOFETIL 500 MG TABLET
ORAL_TABLET | Freq: Two times a day (BID) | ORAL | 0 refills | 0.00000 days | Status: CP
Start: 2019-01-03 — End: 2019-01-15

## 2019-01-03 NOTE — Telephone Encounter (Signed)
Copied from [place] [phone]. Topic: Quick Communication - See Telephone Encounter >> Jan 03, 2019  2:16 PM Sheran Luz wrote: CRM for notification. See Telephone encounter for: 01/03/19.  Apolonio Schneiders with [facility] rheumatology calling to inquire if patient can have labs for medication monitoring done at office, as it is closer than [facility], at patients request. She states that they would be able to fax over order if approved. Advised Apolonio Schneiders that it is not likely but she is requesting a call back, if at all possible. Please advise.   Apolonio Schneiders: #[phone]

## 2019-01-04 NOTE — Telephone Encounter (Signed)
Spoke with Apolonio Schneiders to let her know that pt can have her labs drawn here. She was given the fax number to fax over lab orders.

## 2019-01-04 NOTE — Telephone Encounter (Signed)
Is it okay for pt to have labs ordered by her rheumatologist done here in our office. They will fax over the orders.

## 2019-01-04 NOTE — Telephone Encounter (Signed)
Yes,  Always ok,  But need to make sure the lab techs review them ahead of time to make sure they know how to draw  In case they  are "special"

## 2019-01-06 ENCOUNTER — Other Ambulatory Visit: Payer: Self-pay | Admitting: Internal Medicine

## 2019-01-07 ENCOUNTER — Ambulatory Visit: Payer: Medicare Other | Admitting: Oncology

## 2019-01-07 ENCOUNTER — Ambulatory Visit: Payer: Medicare Other

## 2019-01-07 ENCOUNTER — Other Ambulatory Visit: Payer: Medicare Other

## 2019-01-08 ENCOUNTER — Other Ambulatory Visit: Payer: Medicare Other

## 2019-01-08 ENCOUNTER — Ambulatory Visit: Payer: Medicare Other

## 2019-01-08 ENCOUNTER — Ambulatory Visit: Payer: Medicare Other | Admitting: Oncology

## 2019-01-09 ENCOUNTER — Other Ambulatory Visit: Payer: Self-pay | Admitting: Oncology

## 2019-01-09 ENCOUNTER — Telehealth: Payer: Self-pay | Admitting: Internal Medicine

## 2019-01-09 LAB — PROTIME-INR: INR: 1.6 — AB (ref 0.9–1.1)

## 2019-01-09 NOTE — Telephone Encounter (Signed)
INR is low .  Please confirm that Pt is currently taking 6mg  on Tuesday, Thursday, and Saturday and 3mg  on Monday, Wednesday, Friday and Sunday.   If that is her current regimen,  She needs to increase her dose to 6 mg on Wednesdays  And Sundays,  And continue 3 mg only on Mondays and Friday   Repeat INR in one week

## 2019-01-09 NOTE — Telephone Encounter (Signed)
Spoke with pt and she stated that she is taking 6mg  on Tuesday, Thursday and Saturday and 3mg  all other days. Pt was advised that she needs to change her regimen to 3mg  on Monday and Friday and 6mg  all the other days and recheck her INR again in one week. Pt gave a verbal understanding.

## 2019-01-09 NOTE — Telephone Encounter (Signed)
Patient is returning a call to [place].  Tried to reach office, but she was with a patient on a virtual visit.  Please call patient back at [phone]

## 2019-01-09 NOTE — Telephone Encounter (Signed)
Need to let pt know that we have not received the orders from rheumatology. PEC may speak with pt.

## 2019-01-09 NOTE — Telephone Encounter (Signed)
Copied from [place] [phone]. Topic: Quick Communication - See Telephone Encounter >> Jan 09, 2019 11:41 AM Rayann Heman wrote: CRM for notification. See Telephone encounter for: 01/09/19. Kim calling with [facility] called and stated that patient reported an INR of 1.6. please advise

## 2019-01-09 NOTE — Telephone Encounter (Signed)
LMTCB. [facility] may speak with pt.  

## 2019-01-09 NOTE — Telephone Encounter (Signed)
Pt was advised that we have not received her lab orders yet. Pt stated that she would give them a call in the morning to let them know.

## 2019-01-09 NOTE — Telephone Encounter (Signed)
Pt called to see if orders have been received and if she can schedule labs for rheumatology. Please call back.

## 2019-01-10 ENCOUNTER — Ambulatory Visit (INDEPENDENT_AMBULATORY_CARE_PROVIDER_SITE_OTHER): Payer: Medicare Other | Admitting: Internal Medicine

## 2019-01-10 ENCOUNTER — Other Ambulatory Visit: Payer: Self-pay

## 2019-01-10 DIAGNOSIS — Z7901 Long term (current) use of anticoagulants: Secondary | ICD-10-CM

## 2019-01-10 DIAGNOSIS — R110 Nausea: Secondary | ICD-10-CM

## 2019-01-10 DIAGNOSIS — Z5181 Encounter for therapeutic drug level monitoring: Secondary | ICD-10-CM

## 2019-01-10 DIAGNOSIS — M5431 Sciatica, right side: Secondary | ICD-10-CM

## 2019-01-10 DIAGNOSIS — E038 Other specified hypothyroidism: Secondary | ICD-10-CM | POA: Diagnosis not present

## 2019-01-10 DIAGNOSIS — E119 Type 2 diabetes mellitus without complications: Secondary | ICD-10-CM

## 2019-01-10 MED ORDER — PROCHLORPERAZINE MALEATE 5 MG PO TABS: 5.0000 mg | ORAL_TABLET | Freq: Four times a day (QID) | ORAL | 5 refills | Status: AC | PRN

## 2019-01-10 MED ORDER — WARFARIN SODIUM 3 MG PO TABS: 3.0000 mg | ORAL_TABLET | Freq: Every day | ORAL | 1 refills | Status: DC

## 2019-01-10 MED ORDER — WARFARIN SODIUM 6 MG PO TABS: 6.0000 mg | ORAL_TABLET | Freq: Every day | ORAL | 2 refills | Status: DC

## 2019-01-10 MED ORDER — FENTANYL 50 MCG/HR TD PT72: 1.0000 | MEDICATED_PATCH | TRANSDERMAL | 0 refills | Status: DC

## 2019-01-10 NOTE — Progress Notes (Signed)
Telephone Visit/Note  This visit type was conducted due to national recommendations for restrictions regarding the COVID-19 pandemic (e.g. social distancing).  This format is felt to be most appropriate for this patient at this time.  All issues noted in this document were discussed and addressed.  No physical exam was performed (except for noted visual exam findings with Video Visits).   I connected with@ on 01/11/19 at 11:00 AM EDT by  telephone and verified that I am speaking with the correct person using two identifiers. Location patient: home Location provider: work or home office Persons participating in the virtual visit: patient, provider  I discussed the limitations, risks, security and privacy concerns of performing an evaluation and management service by telephone and the availability of in person appointments. I also discussed with the patient that there may be a patient responsible charge related to this service. The patient expressed understanding and agreed to proceed.  Reason for visit: follow up on paIn management and other chronic issues   HPI:  84 yr old female with complicated medical history including type 2 DM,  Breast cancer, Sjogren's syndrome , recurrent VTE, sheltering at home and in need for follow up on chronic back pain secondary to history of vertebral fractures,  Chronic nausea with history of volvulus requiring surgery , candida esophagitis and hiatal hernia with history of  prolonged enteral nutrition via feeding tube   The patient has no signs or symptoms of COVID 19 infection (fever, cough, sore throat  or shortness of breath beyond what is typical for patient).  Patient denies contact with other persons with the above mentioned symptoms or with anyone confirmed to have COVID 19 and is sheltering at home .   1) Nausea;  Now intermittent  Symptoms reportedly are  better controlled with compazine than with phenergan,  Not using daily.   weight stable at 115 lbs,   eating 3 meals daily  2) Deconditioning:  She reports feeling generally weak.  She has not left the house in over a month and has not been exercising or even walking on a scheduled basis.   3) chronic hip and back pain:  She reports that her pain is vetter controlled using the 50 mcg fentanyl transdermal,  But still has increase pain every afternoon and  by late afternoon pain is not tolerable.  Wants to try adding back  1/2 oxycodone,  Which she still has leftover  No side effects from fentanyl   4) IDA; getting a transfusion next Monday at the [facility]    5) type 2 DM:  She is checking blood sugars once daily at variable times.  BS have been under 130 fasting and < 150 post prandially.  Denies any recent hypoglyemic events.    ROS: See pertinent positives and negatives per HPI.  Past Medical History:  Diagnosis Date  . Acute glomerulonephritis with other specified pathological lesion in kidney in disease classified elsewhere(580.81)   . Alveolar aeration decreased   . Anemia   . Arthritis   . Atrial fibrillation ([place])   . Breast cancer ([place]) 06/03/2014   positive, radiation  . Cancer (HCC)    Breast  . CHF (congestive heart failure) ([place])   . Diaphragmatic hernia   . DVT (deep venous thrombosis) ([place])   . Dysrhythmia   . Environmental allergies   . Erosive esophagitis 03/28/2015   esophageal biopsy July 2017 c/w CMV cytopathic changes, negative for H Pylori and dysplasia     . Esophageal  reflux   . Esophagitis, CMV ([place]) 05/26/2015   Diagnosed at [facility] with manometry,  Tube feeds started for enteral nutrtion 05/12/15   . Feeding problem    FEEDING TUBE  . Hiatal hernia   . Hyperlipidemia   . Hypertension   . Lower extremity edema   . Lupus (systemic lupus erythematosus) ([place])   . Neuropathy   . Osteopenia   . Oxygen deficiency    USES HS  . Peripheral neuropathy, hereditary/idiopathic   . Personal history of radiation therapy   . Pneumonia   . Primary pulmonary  hypertension ([place])   . Pulmonary hypertension ([place])   . Renal insufficiency   . Sciatica   . Shingles   . Shingles (herpes zoster) polyneuropathy March 2013   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  . Shortness of breath dyspnea   . Unspecified hypothyroidism   . Unspecified menopausal and postmenopausal disorder   . Urinary incontinence without sensory awareness   . Vitamin B deficiency     Past Surgical History:  Procedure Laterality Date  . BREAST BIOPSY Left 2015   invasive mammary carcinoma  . BREAST LUMPECTOMY Left 2015   invasive mammary carcinoma clear margins but close  . BREAST SURGERY    . CATARACT EXTRACTION W/PHACO Right 04/05/2016   Procedure: CATARACT EXTRACTION PHACO AND INTRAOCULAR LENS PLACEMENT (IOC);  Surgeon: Birder Robson, MD;  Location: [facility] ORS;  Service: Ophthalmology;  Laterality: Right;  [place] 01:20 AP% 23.9 CDE 19.29 fluid pack lot # [id number]  . CATARACT EXTRACTION W/PHACO Left 04/26/2016   Procedure: CATARACT EXTRACTION PHACO AND INTRAOCULAR LENS PLACEMENT ([place]);  Surgeon: Birder Robson, MD;  Location: [facility] ORS;  Service: Ophthalmology;  Laterality: Left;   [place] 00:59 AP% 23.2 CDE 13.83 Fluid pack lot # [id number]  . COLONOSCOPY WITH PROPOFOL N/A 06/04/2018   Procedure: COLONOSCOPY WITH PROPOFOL;  Surgeon: Lollie Sails, MD;  Location[facility] ENDOSCOPY;  Service: Endoscopy;  Laterality: N/A;  . DIAGNOSTIC LAPAROSCOPY    . ESOPHAGOGASTRODUODENOSCOPY N/A 07/19/2015   Procedure: ESOPHAGOGASTRODUODENOSCOPY (EGD);  Surgeon: Hulen Luster, MD;  Location: [facility] ENDOSCOPY;  Service: Gastroenterology;  Laterality: N/A;  . ESOPHAGOGASTRODUODENOSCOPY (EGD) WITH PROPOFOL N/A 03/28/2015   Procedure: ESOPHAGOGASTRODUODENOSCOPY (EGD) WITH PROPOFOL;  Surgeon: Robert Bellow, MD;  Location: [facility] ENDOSCOPY;  Service: Endoscopy;  Laterality: N/A;  . ESOPHAGOGASTRODUODENOSCOPY (EGD) WITH PROPOFOL N/A 06/10/2015   Procedure: ESOPHAGOGASTRODUODENOSCOPY (EGD) WITH PROPOFOL;  Surgeon:  Lollie Sails, MD;  Location: [facility] ENDOSCOPY;  Service: Endoscopy;  Laterality: N/A;  . ESOPHAGOGASTRODUODENOSCOPY (EGD) WITH PROPOFOL N/A 10/20/2015   Procedure: ESOPHAGOGASTRODUODENOSCOPY (EGD) WITH PROPOFOL;  Surgeon: Lollie Sails, MD;  Location: [facility] ENDOSCOPY;  Service: Endoscopy;  Laterality: N/A;  . ESOPHAGOGASTRODUODENOSCOPY (EGD) WITH PROPOFOL N/A 02/23/2016   Procedure: ESOPHAGOGASTRODUODENOSCOPY (EGD) WITH PROPOFOL;  Surgeon: Lollie Sails, MD;  Location: [facility] ENDOSCOPY;  Service: Endoscopy;  Laterality: N/A;  . ESOPHAGOGASTRODUODENOSCOPY (EGD) WITH PROPOFOL N/A 06/30/2016   Procedure: ESOPHAGOGASTRODUODENOSCOPY (EGD) WITH PROPOFOL;  Surgeon: Lollie Sails, MD;  Location: [facility] ENDOSCOPY;  Service: Endoscopy;  Laterality: N/A;  . ESOPHAGOGASTRODUODENOSCOPY (EGD) WITH PROPOFOL N/A 01/05/2017   Procedure: ESOPHAGOGASTRODUODENOSCOPY (EGD) WITH PROPOFOL;  Surgeon: Lollie Sails, MD;  Location: [facility] ENDOSCOPY;  Service: Endoscopy;  Laterality: N/A;  . KYPHOPLASTY N/A 02/10/2015   Procedure: RJJOACZYSAY/T0;  Surgeon: Hessie Knows, MD;  Location: [facility] ORS;  Service: Orthopedics;  Laterality: N/A;  . LAPAROTOMY N/A 03/11/2015   Procedure: REDUCTION OF GASTRIC VOLVULUS, SPLEENECTOMY, GASTRIC TUBE PLACEMENT;  Surgeon: Robert Bellow, MD;  Location: [facility]  ORS;  Service: General;  Laterality: N/A;  . MOHS SURGERY    . PERIPHERAL VASCULAR CATHETERIZATION N/A 06/12/2015   Procedure: IVC Filter Insertion;  Surgeon: Algernon Huxley, MD;  Location: [place] CV LAB;  Service: Cardiovascular;  Laterality: N/A;  . PERIPHERAL VASCULAR CATHETERIZATION N/A 08/23/2016   Procedure: IVC Filter Removal;  Surgeon: Katha Cabal, MD;  Location: [place] CV LAB;  Service: Cardiovascular;  Laterality: N/A;  . STOMACH SURGERY     for volvolus    Family History  Problem Relation Age of Onset  . Colon cancer Mother   . Cirrhosis Brother        alcoholic  . Alcohol abuse Sister   . Breast  cancer Sister 42  . Cancer Brother   . Lung cancer Son   . Meniere's disease Son   . Cirrhosis Brother        nonalcoholic     SOCIAL HX: widowed,  Lives with son due to her health issues.  No tobacco or alcohol    Current Outpatient Medications:  .  ALPRAZolam (XANAX) 0.25 MG tablet, Take 1 tablet (0.25 mg total) by mouth 2 (two) times daily as needed for anxiety., Disp: 20 tablet, Rfl: 5 .  Ascorbic Acid (VITAMIN C) 1000 MG tablet, Take 1,000 mg by mouth daily., Disp: , Rfl:  .  Calcium Carbonate-Vitamin D 600-400 MG-UNIT tablet, Take 1 tablet by mouth daily., Disp: , Rfl:  .  cyanocobalamin (,VITAMIN B-12,) 1000 MCG/ML injection, Inject 1 mL (1,000 mcg total) into the muscle every 30 (thirty) days. Pt uses on the 1st of every month., Disp: 10 mL, Rfl: 1 .  fentaNYL (DURAGESIC) 50 MCG/HR, Place 1 patch onto the skin every 3 (three) days., Disp: 10 patch, Rfl: 0 .  hydroxychloroquine (PLAQUENIL) 200 MG tablet, Take 200 mg by mouth daily., Disp: , Rfl:  .  Lactulose 20 GM/30ML SOLN, 30 ml every 4 hours until constipation is relieved, Disp: 236 mL, Rfl: 3 .  letrozole (FEMARA) 2.5 MG tablet, TAKE 1 TABLET BY MOUTH EVERY DAY, Disp: 90 tablet, Rfl: 1 .  lisinopril (PRINIVIL,ZESTRIL) 5 MG tablet, TAKE 1 TABLET (5 MG TOTAL) BY MOUTH DAILY., Disp: 90 tablet, Rfl: 3 .  metoCLOPramide (REGLAN) 10 MG tablet, TAKE 1 TABLET BY MOUTH 4 TIMES DAILY - BEFORE MEALS AND AT BEDTIME., Disp: 360 tablet, Rfl: 0 .  metoprolol succinate (TOPROL-XL) 25 MG 24 hr tablet, Take 12.5 mg by mouth daily. , Disp: , Rfl:  .  mirtazapine (REMERON) 15 MG tablet, 1/2 TO 1 TABLET 30 MINUTES BEFORE BEDTIME DAILY, Disp: 90 tablet, Rfl: 0 .  mycophenolate (CELLCEPT) 500 MG tablet, Take 1,000 mg by mouth 2 (two) times daily., Disp: , Rfl:  .  ondansetron (ZOFRAN ODT) 4 MG disintegrating tablet, Take 1 tablet (4 mg total) by mouth every 8 (eight) hours as needed for nausea or vomiting., Disp: 20 tablet, Rfl: 0 .   oxyCODONE-acetaminophen (PERCOCET/ROXICET) 5-325 MG tablet, Take 1 tablet by mouth every 8 (eight) hours as needed for severe pain., Disp: 65 tablet, Rfl: 0 .  OXYGEN, Inhale 2 mLs into the lungs at bedtime., Disp: , Rfl:  .  pantoprazole (PROTONIX) 40 MG tablet, Take 40 mg by mouth 2 (two) times daily., Disp: , Rfl:  .  Polyvinyl Alcohol-Povidone (REFRESH OP), Place 2 drops into both eyes daily as needed (dry eyes). , Disp: , Rfl:  .  predniSONE (DELTASONE) 5 MG tablet, Take 5 mg by mouth daily. , Disp: ,  Rfl:  .  prochlorperazine (COMPAZINE) 5 MG tablet, Take 1 tablet (5 mg total) by mouth every 6 (six) hours as needed for nausea or vomiting., Disp: 30 tablet, Rfl: 5 .  Saccharomyces boulardii (PROBIOTIC) 250 MG CAPS, Take 1 tablet by mouth daily., Disp: 14 capsule, Rfl:  .  sucralfate (CARAFATE) 1 GM/10ML suspension, Take 1 g by mouth 4 (four) times daily. Before meals and nightly, Disp: , Rfl:  .  SYNTHROID 125 MCG tablet, TAKE 1 TABLET BY MOUTH EVERY DAY, Disp: 90 tablet, Rfl: 1 .  SYNTHROID 137 MCG tablet, TAKE 1 TABLET BY MOUTH EVERY DAY, Disp: 90 tablet, Rfl: 0 .  Syringe/Needle, Disp, (SYRINGE 3CC/25GX1") 25G X 1" 3 ML MISC, Use for b12 injections, Disp: 50 each, Rfl: 0 .  Tadalafil, PAH, (ADCIRCA) 20 MG TABS, Take 40 mg by mouth daily., Disp: , Rfl:  .  warfarin (COUMADIN) 3 MG tablet, Take 1 tablet (3 mg total) by mouth daily., Disp: 90 tablet, Rfl: 1 .  warfarin (COUMADIN) 6 MG tablet, Take 1 tablet (6 mg total) by mouth daily. As directed.  PATIENT NEEDS TODAY, Disp: 30 tablet, Rfl: 2  EXAM:  VITALS per patient if applicable:  GENERAL: alert, oriented, appears well and in no acute distress  HEENT: atraumatic, conjunttiva clear, no obvious abnormalities on inspection of external nose and ears  NECK: normal movements of the head and neck  LUNGS: on inspection no signs of respiratory distress, breathing rate appears normal, no obvious gross SOB, gasping or wheezing  CV: no  obvious cyanosis  MS: moves all visible extremities without noticeable abnormality  PSYCH/NEURO: pleasant and cooperative, no obvious depression or anxiety, speech and thought processing grossly intact  ASSESSMENT AND PLAN:  Discussed the following assessment and plan:  Type 2 diabetes mellitus without complication, without long-term current use of insulin (HCC) - Plan: Hemoglobin A1c  Anticoagulation goal of INR 2 to 3  Sciatica of right side  Chronic nausea  Other specified hypothyroidism - Plan: TSH  Anticoagulation goal of INR 2 to 3 Managed with weekly serial measurements reported using MD INR   INR was 1.6 on April 22 with weekly warfarin dose of 30 mg (6 mg x 3,.  3 mg x 4).  Appetite has improved.  Will increase weekly dose to 36 mg (6 mg x 5,  3 mg x 2) and recheck in 1 week.   Lab Results  Component Value Date   INR 2.1 (A) 12/26/2018   INR 1.8 (A) 12/19/2018   INR 2.6 (A) 12/12/2018     Sciatica She is at high risk for complications from steroid injections and surgery.  Continue chronic pain management using fentanyl transdermal 50 mg  q 72 hours .  Authorized to supplement in afternoon with 1/2 tablet oxycodone   Chronic nausea Secondary to chronic gastritis by last EGD April 2018.    She has had multiple GI issues in the past several years including gastric volvulus,  Candida esophagitis requiring feeding tube for enteral feeds (now removed) .  Continue daily pantoprazole,  Prn anti emetics.  Advised to follow up with Dr Gustavo Lah after the COVID 19 pandemic has resolved.  Symptoms are better managed with prn compazine than with phenergan,  And her weight is stable currently   Hypothyroidism TSH was suppressed in January and dose was reduced to 125 mcg in late January.  Repeat TSH is due  Now and has been ordered   Lab Results  Component Value Date  TSH 0.105 (L) 10/09/2018     Type 2 diabetes mellitus without complications ([place]) Managed with diet since  diagnosis several years ago..  She is taking an ACE inhibitor  For management of microalbuminuria .  Has SLE nephropathy as well .  Aspirin C/I due to use of warfarin.  LDL was < 70  In 2015 , and repeat screening for hyperlipidemia has not been done in several years due to serious comorbidities  Lab Results  Component Value Date   HGBA1C 6.4 08/09/2018   Lab Results  Component Value Date   MICROALBUR 8.7 (H) 09/14/2016      Lab Results  Component Value Date   CHOL 164 10/11/2013   HDL 69.70 10/11/2013   LDLCALC 64 10/11/2013   LDLDIRECT 50.0 05/09/2016   TRIG 152.0 (H) 10/11/2013   CHOLHDL 2 10/11/2013       I discussed the assessment and treatment plan with the patient. The patient was provided an opportunity to ask questions and all were answered. The patient agreed with the plan and demonstrated an understanding of the instructions.   The patient was advised to call back or seek an in-person evaluation if the symptoms worsen or if the condition fails to improve as anticipated.  I provided 25 minutes of non-face-to-face time during this encounter.   Crecencio Mc, MD

## 2019-01-10 NOTE — Progress Notes (Signed)
Pt is scheduled for 01/24/2019 at 12pm. Pt is aware of appt date and time.

## 2019-01-13 NOTE — Assessment & Plan Note (Addendum)
Secondary to chronic gastritis by last EGD April 2018.    She has had multiple GI issues in the past several years including gastric volvulus,  Candida esophagitis requiring feeding tube for enteral feeds (now removed) .  Continue daily pantoprazole,  Prn anti emetics.  Advised to follow up with Dr Gustavo Lah after the COVID 19 pandemic has resolved.  Symptoms are better managed with prn compazine than with phenergan,  And her weight is stable currently

## 2019-01-13 NOTE — Assessment & Plan Note (Signed)
She is at high risk for complications from steroid injections and surgery.  Continue chronic pain management using fentanyl transdermal 50 mg  q 72 hours .  Authorized to supplement in afternoon with 1/2 tablet oxycodone

## 2019-01-13 NOTE — Assessment & Plan Note (Signed)
TSH was suppressed in January and dose was reduced to 125 mcg in late January.  Repeat TSH is due  Now and has been ordered   Lab Results  Component Value Date   TSH 0.105 (L) 10/09/2018

## 2019-01-13 NOTE — Assessment & Plan Note (Addendum)
Managed with diet since diagnosis several years ago..  She is taking an ACE inhibitor  For management of microalbuminuria .  Has SLE nephropathy as well .  Aspirin C/I due to use of warfarin.  LDL was < 70  In 2015 , and repeat screening for hyperlipidemia has not been done in several years due to serious comorbidities  Lab Results  Component Value Date   HGBA1C 6.4 08/09/2018   Lab Results  Component Value Date   MICROALBUR 8.7 (H) 09/14/2016      Lab Results  Component Value Date   CHOL 164 10/11/2013   HDL 69.70 10/11/2013   LDLCALC 64 10/11/2013   LDLDIRECT 50.0 05/09/2016   TRIG 152.0 (H) 10/11/2013   CHOLHDL 2 10/11/2013

## 2019-01-13 NOTE — Assessment & Plan Note (Addendum)
Managed with weekly serial measurements reported using MD INR   INR was 1.6 on April 22 with weekly warfarin dose of 30 mg (6 mg x 3,.  3 mg x 4).  Appetite has improved.  Will increase weekly dose to 36 mg (6 mg x 5,  3 mg x 2) and recheck in 1 week.   Lab Results  Component Value Date   INR 2.1 (A) 12/26/2018   INR 1.8 (A) 12/19/2018   INR 2.6 (A) 12/12/2018

## 2019-01-14 ENCOUNTER — Inpatient Hospital Stay (HOSPITAL_BASED_OUTPATIENT_CLINIC_OR_DEPARTMENT_OTHER): Payer: Medicare Other | Admitting: Oncology

## 2019-01-14 ENCOUNTER — Encounter: Payer: Self-pay | Admitting: Oncology

## 2019-01-14 ENCOUNTER — Other Ambulatory Visit: Payer: Self-pay | Admitting: Oncology

## 2019-01-14 ENCOUNTER — Inpatient Hospital Stay: Payer: Medicare Other | Attending: Oncology

## 2019-01-14 ENCOUNTER — Inpatient Hospital Stay: Payer: Medicare Other

## 2019-01-14 ENCOUNTER — Other Ambulatory Visit: Payer: Self-pay

## 2019-01-14 VITALS — BP 102/62 | HR 94 | Temp 99.1°F | Resp 18 | Wt 111.8 lb

## 2019-01-14 DIAGNOSIS — D631 Anemia in chronic kidney disease: Secondary | ICD-10-CM | POA: Diagnosis not present

## 2019-01-14 DIAGNOSIS — C50412 Malignant neoplasm of upper-outer quadrant of left female breast: Secondary | ICD-10-CM | POA: Insufficient documentation

## 2019-01-14 DIAGNOSIS — Z87891 Personal history of nicotine dependence: Secondary | ICD-10-CM | POA: Insufficient documentation

## 2019-01-14 DIAGNOSIS — N189 Chronic kidney disease, unspecified: Secondary | ICD-10-CM | POA: Diagnosis not present

## 2019-01-14 DIAGNOSIS — Z79811 Long term (current) use of aromatase inhibitors: Secondary | ICD-10-CM | POA: Insufficient documentation

## 2019-01-14 DIAGNOSIS — Z923 Personal history of irradiation: Secondary | ICD-10-CM | POA: Diagnosis not present

## 2019-01-14 DIAGNOSIS — Z79899 Other long term (current) drug therapy: Secondary | ICD-10-CM | POA: Insufficient documentation

## 2019-01-14 DIAGNOSIS — Z862 Personal history of diseases of the blood and blood-forming organs and certain disorders involving the immune mechanism: Secondary | ICD-10-CM | POA: Diagnosis not present

## 2019-01-14 DIAGNOSIS — N183 Chronic kidney disease, stage 3 unspecified: Secondary | ICD-10-CM

## 2019-01-14 DIAGNOSIS — D539 Nutritional anemia, unspecified: Secondary | ICD-10-CM

## 2019-01-14 DIAGNOSIS — Z86718 Personal history of other venous thrombosis and embolism: Secondary | ICD-10-CM | POA: Insufficient documentation

## 2019-01-14 DIAGNOSIS — M1990 Unspecified osteoarthritis, unspecified site: Secondary | ICD-10-CM | POA: Insufficient documentation

## 2019-01-14 DIAGNOSIS — Z170 Estrogen receptor positive status [ER+]: Secondary | ICD-10-CM | POA: Insufficient documentation

## 2019-01-14 DIAGNOSIS — Z7901 Long term (current) use of anticoagulants: Secondary | ICD-10-CM | POA: Diagnosis not present

## 2019-01-14 DIAGNOSIS — D649 Anemia, unspecified: Secondary | ICD-10-CM

## 2019-01-14 LAB — CBC WITH DIFFERENTIAL/PLATELET
Abs Immature Granulocytes: 0.01 10*3/uL (ref 0.00–0.07)
Basophils Absolute: 0.1 10*3/uL (ref 0.0–0.1)
Basophils Relative: 1 %
Eosinophils Absolute: 0.2 10*3/uL (ref 0.0–0.5)
Eosinophils Relative: 3 %
HCT: 28 % — ABNORMAL LOW (ref 36.0–46.0)
Hemoglobin: 7.9 g/dL — ABNORMAL LOW (ref 12.0–15.0)
Immature Granulocytes: 0 %
Lymphocytes Relative: 17 %
Lymphs Abs: 0.8 10*3/uL (ref 0.7–4.0)
MCH: 30.4 pg (ref 26.0–34.0)
MCHC: 28.2 g/dL — ABNORMAL LOW (ref 30.0–36.0)
MCV: 107.7 fL — ABNORMAL HIGH (ref 80.0–100.0)
Monocytes Absolute: 0.6 10*3/uL (ref 0.1–1.0)
Monocytes Relative: 12 %
Neutro Abs: 3.2 10*3/uL (ref 1.7–7.7)
Neutrophils Relative %: 67 %
Platelets: 216 10*3/uL (ref 150–400)
RBC: 2.6 MIL/uL — ABNORMAL LOW (ref 3.87–5.11)
RDW: 16.5 % — ABNORMAL HIGH (ref 11.5–15.5)
WBC: 4.8 10*3/uL (ref 4.0–10.5)
nRBC: 1 % — ABNORMAL HIGH (ref 0.0–0.2)

## 2019-01-14 LAB — IRON AND TIBC
Iron: 105 ug/dL (ref 28–170)
Saturation Ratios: 71 % — ABNORMAL HIGH (ref 10.4–31.8)
TIBC: 148 ug/dL — ABNORMAL LOW (ref 250–450)
UIBC: 43 ug/dL

## 2019-01-14 LAB — FERRITIN: Ferritin: 472 ng/mL — ABNORMAL HIGH (ref 11–307)

## 2019-01-14 MED ORDER — DARBEPOETIN ALFA 300 MCG/0.6ML IJ SOSY
300.0000 ug | PREFILLED_SYRINGE | INTRAMUSCULAR | Status: DC
  Administered 2019-01-14: 300 ug via SUBCUTANEOUS
  Filled 2019-01-14: qty 0.6

## 2019-01-14 NOTE — Progress Notes (Signed)
Patient here for follow up. No concerns voiced.  °

## 2019-01-15 ENCOUNTER — Institutional Professional Consult (permissible substitution): Admit: 2019-01-15 | Discharge: 2019-01-16 | Payer: MEDICARE

## 2019-01-15 DIAGNOSIS — L930 Discoid lupus erythematosus: Secondary | ICD-10-CM

## 2019-01-15 DIAGNOSIS — M3214 Glomerular disease in systemic lupus erythematosus: Principal | ICD-10-CM

## 2019-01-15 MED ORDER — PREDNISONE 5 MG TABLET
ORAL_TABLET | Freq: Every day | ORAL | 5 refills | 0 days | Status: CP
Start: 2019-01-15 — End: 2019-05-22

## 2019-01-15 MED ORDER — MYCOPHENOLATE MOFETIL 500 MG TABLET
ORAL_TABLET | Freq: Two times a day (BID) | ORAL | 0 refills | 0.00000 days | Status: CP
Start: 2019-01-15 — End: 2019-02-07

## 2019-01-15 MED ORDER — HYDROXYCHLOROQUINE 200 MG TABLET
ORAL_TABLET | Freq: Every day | ORAL | 3 refills | 0 days | Status: CP
Start: 2019-01-15 — End: ?

## 2019-01-15 NOTE — Progress Notes (Signed)
I connected with Diana [name] on 01/15/19 at  2:15 PM EDT by video enabled telemedicine visit and verified that I am speaking with the correct person using two identifiers.   I discussed the limitations, risks, security and privacy concerns of performing an evaluation and management service by telemedicine and the availability of in-person appointments. I also discussed with the patient that there may be a patient responsible charge related to this service. The patient expressed understanding and agreed to proceed.  Other persons participating in the visit and their role in the encounter:  none  Patient's location:  [facility] Provider's location:  home  Chief Complaint:  Routine f/u of anemia  History of present illness:  Oncology History   84 year old female with stage I (T1 C. N0 M0) invasive mammary carcinoma status post wide local excision and sentinel node biopsy tumor is ER/PR positive HER-2/neu not over expressed with a low Oncotype DX score of 17 for adjuvant whole breast radiation Oncotype DX score is 17 Patient has finished radiation therapy in December of 2015 Started letrozole from December, 2015     Breast cancer, left ([place])   05/26/2014 Initial Diagnosis    Breast cancer of upper-outer quadrant of left female breast ([place]), stage I, T1 cN0 M0, ER/PR positive HER-2/neu not overexpressed.    01/22/202015 Oncotype testing    Oncotype DX score 17     - 09/05/2014 Radiation Therapy       09/05/2014 -  Anti-estrogen oral therapy    Started letrozole    Patient has been having progressive anemia especially over the last 6 months her hemoglobin is drifted down to 8 from her baseline of 10.Results of anemia work-up from 10/09/2018 are as follows: CBC showed a white count of 5.6, H&H of 8.1/27.5 with an MCV of 100 and a platelet count of 315.  CMP was significant for elevated creatinine of 1.4.  B12 folate were normal.  TSH was mildly low at 0.1.  Multiple myeloma panel showed  no M protein.  Polyclonal gammopathy.  Haptoglobin was elevated at 344.  Reticulocyte count was 2.1% which is inappropriately low for the degree of anemia indicative of hypoproliferative anemia.  Iron studies showed iron saturation of 21% and ferritin of 111.  Interval history reports feeling fatigued.  She has had ongoing intermittent abdominal pain for which she follows up with GI.  Denies any blood in stool or urine.   Review of Systems  Constitutional: Positive for malaise/fatigue. Negative for chills, fever and weight loss.  HENT: Negative for congestion, ear discharge and nosebleeds.   Eyes: Negative for blurred vision.  Respiratory: Negative for cough, hemoptysis, sputum production, shortness of breath and wheezing.   Cardiovascular: Negative for chest pain, palpitations, orthopnea and claudication.  Gastrointestinal: Negative for abdominal pain, blood in stool, constipation, diarrhea, heartburn, melena, nausea and vomiting.  Genitourinary: Negative for dysuria, flank pain, frequency, hematuria and urgency.  Musculoskeletal: Negative for back pain, joint pain and myalgias.  Skin: Negative for rash.  Neurological: Negative for dizziness, tingling, focal weakness, seizures, weakness and headaches.  Endo/Heme/Allergies: Does not bruise/bleed easily.  Psychiatric/Behavioral: Negative for depression and suicidal ideas. The patient does not have insomnia.     Allergies  Allergen Reactions  . Iodinated Diagnostic Agents Hives    Pt states she broke out in hives years ago, has been premedicated in the past.   . Tramadol Other (See Comments) and Rash    Rash  . Amoxicillin Other (See Comments)  Reaction:  Stomach cramps   . Cefuroxime Itching  . Metrizamide Hives  . Morphine And Related     Reaction: face flushed    . Propoxyphene     Stomach cramp  . Cephalexin Rash  . Sulfa Antibiotics Rash    REACTION: Rash  . Sulfonamide Derivatives Rash  . Tramadol Hcl Rash  . Venlafaxine  Rash    Past Medical History:  Diagnosis Date  . Acute glomerulonephritis with other specified pathological lesion in kidney in disease classified elsewhere(580.81)   . Alveolar aeration decreased   . Anemia   . Arthritis   . Atrial fibrillation ([place])   . Breast cancer ([place]) 06/03/2014   positive, radiation  . Cancer (HCC)    Breast  . CHF (congestive heart failure) ([place])   . Diaphragmatic hernia   . DVT (deep venous thrombosis) ([place])   . Dysrhythmia   . Environmental allergies   . Erosive esophagitis 03/28/2015   esophageal biopsy July 2017 c/w CMV cytopathic changes, negative for H Pylori and dysplasia     . Esophageal reflux   . Esophagitis, CMV ([place]) 05/26/2015   Diagnosed at [facility] with manometry,  Tube feeds started for enteral nutrtion 05/12/15   . Feeding problem    FEEDING TUBE  . Hiatal hernia   . Hyperlipidemia   . Hypertension   . Lower extremity edema   . Lupus (systemic lupus erythematosus) ([place])   . Neuropathy   . Osteopenia   . Oxygen deficiency    USES HS  . Peripheral neuropathy, hereditary/idiopathic   . Personal history of radiation therapy   . Pneumonia   . Primary pulmonary hypertension ([place])   . Pulmonary hypertension ([employer])   . Renal insufficiency   . Sciatica   . Shingles   . Shingles (herpes zoster) polyneuropathy March 2013   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  . Shortness of breath dyspnea   . Unspecified hypothyroidism   . Unspecified menopausal and postmenopausal disorder   . Urinary incontinence without sensory awareness   . Vitamin B deficiency     Past Surgical History:  Procedure Laterality Date  . BREAST BIOPSY Left 2015   invasive mammary carcinoma  . BREAST LUMPECTOMY Left 2015   invasive mammary carcinoma clear margins but close  . BREAST SURGERY    . CATARACT EXTRACTION W/PHACO Right 04/05/2016   Procedure: CATARACT EXTRACTION PHACO AND INTRAOCULAR LENS PLACEMENT (IOC);  Surgeon: Birder Robson, MD;  Location: [facility] ORS;   Service: Ophthalmology;  Laterality: Right;  [place] 01:20 AP% 23.9 CDE 19.29 fluid pack lot # [id number]  . CATARACT EXTRACTION W/PHACO Left 04/26/2016   Procedure: CATARACT EXTRACTION PHACO AND INTRAOCULAR LENS PLACEMENT ([place]);  Surgeon: Birder Robson, MD;  Location: [facility] ORS;  Service: Ophthalmology;  Laterality: Left;   [place] 00:59 AP% 23.2 CDE 13.83 Fluid pack lot # [id number]  . COLONOSCOPY WITH PROPOFOL N/A 06/04/2018   Procedure: COLONOSCOPY WITH PROPOFOL;  Surgeon: Lollie Sails, MD;  Location[facility] ENDOSCOPY;  Service: Endoscopy;  Laterality: N/A;  . DIAGNOSTIC LAPAROSCOPY    . ESOPHAGOGASTRODUODENOSCOPY N/A 07/19/2015   Procedure: ESOPHAGOGASTRODUODENOSCOPY (EGD);  Surgeon: Hulen Luster, MD;  Location: [facility] ENDOSCOPY;  Service: Gastroenterology;  Laterality: N/A;  . ESOPHAGOGASTRODUODENOSCOPY (EGD) WITH PROPOFOL N/A 03/28/2015   Procedure: ESOPHAGOGASTRODUODENOSCOPY (EGD) WITH PROPOFOL;  Surgeon: Robert Bellow, MD;  Location: [facility] ENDOSCOPY;  Service: Endoscopy;  Laterality: N/A;  . ESOPHAGOGASTRODUODENOSCOPY (EGD) WITH PROPOFOL N/A 06/10/2015   Procedure: ESOPHAGOGASTRODUODENOSCOPY (EGD) WITH PROPOFOL;  Surgeon: Lollie Sails,  MD;  Location: [facility] ENDOSCOPY;  Service: Endoscopy;  Laterality: N/A;  . ESOPHAGOGASTRODUODENOSCOPY (EGD) WITH PROPOFOL N/A 10/20/2015   Procedure: ESOPHAGOGASTRODUODENOSCOPY (EGD) WITH PROPOFOL;  Surgeon: Lollie Sails, MD;  Location: [facility] ENDOSCOPY;  Service: Endoscopy;  Laterality: N/A;  . ESOPHAGOGASTRODUODENOSCOPY (EGD) WITH PROPOFOL N/A 02/23/2016   Procedure: ESOPHAGOGASTRODUODENOSCOPY (EGD) WITH PROPOFOL;  Surgeon: Lollie Sails, MD;  Location: [facility] ENDOSCOPY;  Service: Endoscopy;  Laterality: N/A;  . ESOPHAGOGASTRODUODENOSCOPY (EGD) WITH PROPOFOL N/A 06/30/2016   Procedure: ESOPHAGOGASTRODUODENOSCOPY (EGD) WITH PROPOFOL;  Surgeon: Lollie Sails, MD;  Location: [facility] ENDOSCOPY;  Service: Endoscopy;  Laterality: N/A;  . ESOPHAGOGASTRODUODENOSCOPY  (EGD) WITH PROPOFOL N/A 01/05/2017   Procedure: ESOPHAGOGASTRODUODENOSCOPY (EGD) WITH PROPOFOL;  Surgeon: Lollie Sails, MD;  Location: [facility] ENDOSCOPY;  Service: Endoscopy;  Laterality: N/A;  . KYPHOPLASTY N/A 02/10/2015   Procedure: [id number];  Surgeon: Hessie Knows, MD;  Location: [facility] ORS;  Service: Orthopedics;  Laterality: N/A;  . LAPAROTOMY N/A 03/11/2015   Procedure: REDUCTION OF GASTRIC VOLVULUS, SPLEENECTOMY, GASTRIC TUBE PLACEMENT;  Surgeon: Robert Bellow, MD;  Location: [facility] ORS;  Service: General;  Laterality: N/A;  . MOHS SURGERY    . PERIPHERAL VASCULAR CATHETERIZATION N/A 06/12/2015   Procedure: IVC Filter Insertion;  Surgeon: Algernon Huxley, MD;  Location: [place] CV LAB;  Service: Cardiovascular;  Laterality: N/A;  . PERIPHERAL VASCULAR CATHETERIZATION N/A 08/23/2016   Procedure: IVC Filter Removal;  Surgeon: Katha Cabal, MD;  Location: [place] CV LAB;  Service: Cardiovascular;  Laterality: N/A;  . STOMACH SURGERY     for volvolus    Social History   Socioeconomic History  . Marital status: Divorced    Spouse name: Not on file  . Number of children: Not on file  . Years of education: Not on file  . Highest education level: Not on file  Occupational History  . 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 Financial resource strain: Not hard at all  . Food insecurity:    Worry: Never true    Inability: Never true  . Transportation needs:    Medical: No    Non-medical: No  Tobacco Use  . Smoking status: Former Smoker    Last attempt to quit: 05/25/1991    Years since quitting: 27.6  . Smokeless tobacco: Never Used  Substance and Sexual Activity  . Alcohol use: No    Frequency: Never    Comment: rare  . Drug use: No  . Sexual activity: Not on file  Lifestyle  . Physical activity:    Days per week: 0 days    Minutes per session: 0 min  . Stress: Not at all  Relationships  . Social connections:     Talks on phone: Patient refused    Gets together: Patient refused    Attends religious service: Patient refused    Active member of club or organization: Patient refused    Attends meetings of clubs or organizations: Patient refused    Relationship status: Divorced  . Intimate partner violence:    Fear of current or ex partner: No    Emotionally abused: No    Physically abused: No    Forced sexual activity: No  Other Topics Concern  . Not on file  Social History Narrative  . Not on file    Family History  Problem Relation Age of Onset  . Colon cancer Mother   . Cirrhosis Brother  alcoholic  . Alcohol abuse Sister   . Breast cancer Sister 25  . Cancer Brother   . Lung cancer Son   . Meniere's disease Son   . Cirrhosis Brother        nonalcoholic      Current Outpatient Medications:  .  ALPRAZolam (XANAX) 0.25 MG tablet, Take 1 tablet (0.25 mg total) by mouth 2 (two) times daily as needed for anxiety., Disp: 20 tablet, Rfl: 5 .  Ascorbic Acid (VITAMIN C) 1000 MG tablet, Take 1,000 mg by mouth daily., Disp: , Rfl:  .  Calcium Carbonate-Vitamin D 600-400 MG-UNIT tablet, Take 1 tablet by mouth daily., Disp: , Rfl:  .  cyanocobalamin (,VITAMIN B-12,) 1000 MCG/ML injection, Inject 1 mL (1,000 mcg total) into the muscle every 30 (thirty) days. Pt uses on the 1st of every month., Disp: 10 mL, Rfl: 1 .  fentaNYL (DURAGESIC) 50 MCG/HR, Place 1 patch onto the skin every 3 (three) days., Disp: 10 patch, Rfl: 0 .  hydroxychloroquine (PLAQUENIL) 200 MG tablet, Take 200 mg by mouth daily., Disp: , Rfl:  .  letrozole (FEMARA) 2.5 MG tablet, TAKE 1 TABLET BY MOUTH EVERY DAY, Disp: 90 tablet, Rfl: 1 .  lisinopril (PRINIVIL,ZESTRIL) 5 MG tablet, TAKE 1 TABLET (5 MG TOTAL) BY MOUTH DAILY., Disp: 90 tablet, Rfl: 3 .  metoCLOPramide (REGLAN) 10 MG tablet, TAKE 1 TABLET BY MOUTH 4 TIMES DAILY - BEFORE MEALS AND AT BEDTIME., Disp: 360 tablet, Rfl: 0 .  metoprolol succinate (TOPROL-XL) 25 MG 24  hr tablet, Take 12.5 mg by mouth daily. , Disp: , Rfl:  .  mirtazapine (REMERON) 15 MG tablet, 1/2 TO 1 TABLET 30 MINUTES BEFORE BEDTIME DAILY, Disp: 90 tablet, Rfl: 0 .  mycophenolate (CELLCEPT) 500 MG tablet, Take 1,000 mg by mouth 2 (two) times daily., Disp: , Rfl:  .  ondansetron (ZOFRAN ODT) 4 MG disintegrating tablet, Take 1 tablet (4 mg total) by mouth every 8 (eight) hours as needed for nausea or vomiting., Disp: 20 tablet, Rfl: 0 .  oxyCODONE-acetaminophen (PERCOCET/ROXICET) 5-325 MG tablet, Take 1 tablet by mouth every 8 (eight) hours as needed for severe pain., Disp: 65 tablet, Rfl: 0 .  OXYGEN, Inhale 2 mLs into the lungs at bedtime., Disp: , Rfl:  .  pantoprazole (PROTONIX) 40 MG tablet, Take 40 mg by mouth 2 (two) times daily., Disp: , Rfl:  .  Polyvinyl Alcohol-Povidone (REFRESH OP), Place 2 drops into both eyes daily as needed (dry eyes). , Disp: , Rfl:  .  predniSONE (DELTASONE) 5 MG tablet, Take 5 mg by mouth daily. , Disp: , Rfl:  .  prochlorperazine (COMPAZINE) 5 MG tablet, Take 1 tablet (5 mg total) by mouth every 6 (six) hours as needed for nausea or vomiting., Disp: 30 tablet, Rfl: 5 .  Saccharomyces boulardii (PROBIOTIC) 250 MG CAPS, Take 1 tablet by mouth daily., Disp: 14 capsule, Rfl:  .  sucralfate (CARAFATE) 1 GM/10ML suspension, Take 1 g by mouth 4 (four) times daily. Before meals and nightly, Disp: , Rfl:  .  SYNTHROID 125 MCG tablet, TAKE 1 TABLET BY MOUTH EVERY DAY, Disp: 90 tablet, Rfl: 1 .  Syringe/Needle, Disp, (SYRINGE 3CC/25GX1") 25G X 1" 3 ML MISC, Use for b12 injections, Disp: 50 each, Rfl: 0 .  Tadalafil, PAH, (ADCIRCA) 20 MG TABS, Take 40 mg by mouth daily., Disp: , Rfl:  .  warfarin (COUMADIN) 3 MG tablet, Take 1 tablet (3 mg total) by mouth daily., Disp: 90 tablet, Rfl:  1 .  warfarin (COUMADIN) 6 MG tablet, Take 1 tablet (6 mg total) by mouth daily. As directed.  PATIENT NEEDS TODAY, Disp: 30 tablet, Rfl: 2 .  Lactulose 20 GM/30ML SOLN, 30 ml every 4 hours  until constipation is relieved (Patient not taking: Reported on 01/14/2019), Disp: 236 mL, Rfl: 3 .  SYNTHROID 137 MCG tablet, TAKE 1 TABLET BY MOUTH EVERY DAY (Patient not taking: Reported on 01/14/2019), Disp: 90 tablet, Rfl: 0  No results found.  No images are attached to the encounter.   CMP Latest Ref Rng & Units 10/09/2018  Glucose 70 - 99 mg/dL 175(H)  BUN 8 - 23 mg/dL 28(H)  Creatinine 0.44 - 1.00 mg/dL 1.47(H)  Sodium 135 - 145 mmol/L 144  Potassium 3.5 - 5.1 mmol/L 4.2  Chloride 98 - 111 mmol/L 112(H)  CO2 22 - 32 mmol/L 24  Calcium 8.9 - 10.3 mg/dL 8.6(L)  Total Protein 6.5 - 8.1 g/dL 6.4(L)  Total Bilirubin 0.3 - 1.2 mg/dL 0.2(L)  Alkaline Phos 38 - 126 U/L 49  AST 15 - 41 U/L 19  ALT 0 - 44 U/L 7   CBC Latest Ref Rng & Units 01/14/2019  WBC 4.0 - 10.5 K/uL 4.8  Hemoglobin 12.0 - 15.0 g/dL 7.9(L)  Hematocrit 36.0 - 46.0 % 28.0(L)  Platelets 150 - 400 K/uL 216     Observation/objective: Patient appears in no acute distress over her video visit today.  Breathing is nonlabored.  She is sitting in a wheelchair  Assessment and plan: Patient is a 84 year old female with history of anemia of chronic kidney disease and history of breast cancer  1.  With regards to her breast cancer she will continue taking letrozole and she will be completing 5 years of hormone therapy this year.  2.  Anemia of chronic kidney disease: Patient had a complete anemia work-up In January 2020 which was essentially unremarkable other than some component of iron deficiency and evidence of chronic kidney disease.  She will have a low TSH of 0.10 and Dr. Derrel Nip decreased her levothyroxine dose from 137 mcg to 125 mcg.  She did receive a trial of Feraheme before giving her Procrit.  Despite receiving iron her hemoglobin has failed to improve and is down to 7.9 today.  I will therefore proceed with Aranesp 300 mcg every 3 weeks at this time I will adjust her dose based on her response to keep her hemoglobin  between 10-11.   Follow-up instructions: Patient received first dose of aranesp today and then in 3 weeks.  I will see her back in 6 weeks in my office.  CBC with differential to be done each time TSH will be added on at 3 weeks  I discussed the assessment and treatment plan with the patient. The patient was provided an opportunity to ask questions and all were answered. The patient agreed with the plan and demonstrated an understanding of the instructions.   The patient was advised to call back or seek an in-person evaluation if the symptoms worsen or if the condition fails to improve as anticipated.   Visit Diagnosis: 1. History of anemia due to chronic kidney disease   2. Erythropoietin (EPO) stimulating agent anemia management patient     Dr. Randa Evens, MD, MPH [facility] Pager- [phone] 01/15/2019 9:18 AM

## 2019-01-16 ENCOUNTER — Ambulatory Visit: Payer: Self-pay | Admitting: Internal Medicine

## 2019-01-16 LAB — POCT INR: INR: 3.7 — AB (ref 0.9–1.1)

## 2019-01-16 NOTE — Telephone Encounter (Signed)
Caller hung up before agent could transfer her to me.

## 2019-01-17 ENCOUNTER — Telehealth: Payer: Self-pay

## 2019-01-17 ENCOUNTER — Telehealth: Payer: Self-pay | Admitting: Internal Medicine

## 2019-01-17 NOTE — Telephone Encounter (Signed)
INR labs called with out of range values. Agent unable to transfer to triage due to IT/phone service issues. CAll was dropped. [place] called practice, 'Gae Bon' to alert of call. States practice will follow up. Value not in Epic.

## 2019-01-17 NOTE — Telephone Encounter (Signed)
Copied from [place] [id number]. Topic: Quick Communication - See Telephone Encounter >> Jan 17, 2019 11:46 AM Loma [place] wrote: CRM for notification. See Telephone encounter for: 01/17/19. [facility] calling, [place],  for [place] regarding testing done on 4/29.  PT INR value was 3/7

## 2019-01-17 NOTE — Telephone Encounter (Signed)
Pt's INR from 01/16/2019 is 3.7. Spoke with pt and she stated that she took an extra dose on Monday be accident. She stated that she couldn't remember if she took it so she went ahead and took it again. Per Dr. Lupita Dawn verbal order she stated for the pt to not take any coumadin today or tomorrow and then resume the regular regimen on Saturday. Pt gave a verbal understanding.

## 2019-01-17 NOTE — Telephone Encounter (Signed)
Checked with lab and results and information already handled by Dr. Demetrios Isaacs' [place] Janett [place]ow).  Diana [name],cma

## 2019-01-21 ENCOUNTER — Ambulatory Visit (INDEPENDENT_AMBULATORY_CARE_PROVIDER_SITE_OTHER): Payer: Medicare Other | Admitting: Internal Medicine

## 2019-01-21 ENCOUNTER — Other Ambulatory Visit: Payer: Self-pay

## 2019-01-21 DIAGNOSIS — Z7901 Long term (current) use of anticoagulants: Secondary | ICD-10-CM | POA: Diagnosis not present

## 2019-01-21 DIAGNOSIS — E119 Type 2 diabetes mellitus without complications: Secondary | ICD-10-CM

## 2019-01-21 DIAGNOSIS — E038 Other specified hypothyroidism: Secondary | ICD-10-CM | POA: Diagnosis not present

## 2019-01-21 DIAGNOSIS — Z5181 Encounter for therapeutic drug level monitoring: Secondary | ICD-10-CM | POA: Diagnosis not present

## 2019-01-21 DIAGNOSIS — J0110 Acute frontal sinusitis, unspecified: Secondary | ICD-10-CM

## 2019-01-21 MED ORDER — PREDNISONE 10 MG PO TABS: ORAL_TABLET | ORAL | 0 refills | Status: DC

## 2019-01-21 MED ORDER — AMOXICILLIN-POT CLAVULANATE 875-125 MG PO TABS: 1.0000 | ORAL_TABLET | Freq: Two times a day (BID) | ORAL | 0 refills | Status: DC

## 2019-01-21 NOTE — Assessment & Plan Note (Signed)
Managed with weekly serial measurements reported using MD INR   INR was  3.7 on April 30 due to patient error of  Doubling dose  Earlier in the week,  Which was adressed by suspension of medication for two days.  Her current regimen is 6 mg 5 days per week and 3 mg 2 days per week  For a total weekly dose of 36 mg weekly  Lab Results  Component Value Date   INR 3.7 (A) 01/17/2019   INR 2.1 (A) 12/26/2018   INR 1.8 (A) 12/19/2018

## 2019-01-21 NOTE — Progress Notes (Signed)
Telephone  Note  This visit type was conducted due to national recommendations for restrictions regarding the COVID-19 pandemic (e.g. social distancing).  This format is felt to be most appropriate for this patient at this time.  All issues noted in this document were discussed and addressed.  No physical exam was performed (except for noted visual exam findings with Video Visits).   I connected with@ on 01/21/19 at 11:30 AM EDT by  telephone and verified that I am speaking with the correct person using two identifiers. Location patient: home Location provider: work Persons participating in the virtual visit: patient, provider  I discussed the limitations, risks, security and privacy concerns of performing an evaluation and management service by telephone and the availability of in person appointments. I also discussed with the patient that there may be a patient responsible charge related to this service. The patient expressed understanding and agreed to proceed.  HPI:  2 week follow up on pain management,  Chronic intermittent nausea   Doesn't feel good today. Sinuses have been bothering her for a week,  Hurts behind both eyes as well.   No allergy symptoms. , not sneezing or coughing,  Head feels stopped up, worse in the am . Subjective fevers but t max was 100 or less. morning cough is subjective of purulent phlegm   Pain is better managed with fentanyl  And a full oxycodone taken as needed .  Not more than once every other day    April 28 Seeing  [facility] for management of SLE . Taking cellcept, Placquenil and low dose prednisone   Seeing [facility] for IDA  Heme transfusion has failed;  hgb was 7.9,  (down from 8.1)  ,  received aranesp   April 27  Nausea better controlled with compazine. Not using daily   ROS: See pertinent positives and negatives per HPI.  Past Medical History:  Diagnosis Date  . Acute glomerulonephritis with other specified pathological lesion in kidney in  disease classified elsewhere(580.81)   . Alveolar aeration decreased   . Anemia   . Arthritis   . Atrial fibrillation ([place])   . Breast cancer ([place]) 06/03/2014   positive, radiation  . Cancer (HCC)    Breast  . CHF (congestive heart failure) ([place])   . Diaphragmatic hernia   . DVT (deep venous thrombosis) ([place])   . Dysrhythmia   . Environmental allergies   . Erosive esophagitis 03/28/2015   esophageal biopsy July 2017 c/w CMV cytopathic changes, negative for H Pylori and dysplasia     . Esophageal reflux   . Esophagitis, CMV ([place]) 05/26/2015   Diagnosed at [facility] with manometry,  Tube feeds started for enteral nutrtion 05/12/15   . Feeding problem    FEEDING TUBE  . Hiatal hernia   . Hyperlipidemia   . Hypertension   . Lower extremity edema   . Lupus (systemic lupus erythematosus) ([place])   . Neuropathy   . Osteopenia   . Oxygen deficiency    USES HS  . Peripheral neuropathy, hereditary/idiopathic   . Personal history of radiation therapy   . Pneumonia   . Primary pulmonary hypertension ([place])   . Pulmonary hypertension ([place])   . Renal insufficiency   . Sciatica   . Shingles   . Shingles (herpes zoster) polyneuropathy March 2013   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  . Shortness of breath dyspnea   . Unspecified hypothyroidism   . Unspecified menopausal and postmenopausal disorder   . Urinary incontinence  without sensory awareness   . Vitamin B deficiency     Past Surgical History:  Procedure Laterality Date  . BREAST BIOPSY Left 2015   invasive mammary carcinoma  . BREAST LUMPECTOMY Left 2015   invasive mammary carcinoma clear margins but close  . BREAST SURGERY    . CATARACT EXTRACTION W/PHACO Right 04/05/2016   Procedure: CATARACT EXTRACTION PHACO AND INTRAOCULAR LENS PLACEMENT (IOC);  Surgeon: Birder Robson, MD;  Location: [facility] ORS;  Service: Ophthalmology;  Laterality: Right;  [place] 01:20 AP% 23.9 CDE 19.29 fluid pack lot # [id number]  . CATARACT EXTRACTION W/PHACO Left  04/26/2016   Procedure: CATARACT EXTRACTION PHACO AND INTRAOCULAR LENS PLACEMENT ([place]);  Surgeon: Birder Robson, MD;  Location: [facility];  Service: Ophthalmology;  Laterality: Left;   [place] 00:59 AP% 23.2 CDE 13.83 Fluid pack lot # [id number]  . COLONOSCOPY WITH PROPOFOL N/A 06/04/2018   Procedure: COLONOSCOPY WITH PROPOFOL;  Surgeon: Lollie Sails, MD;  Location: [facility] ENDOSCOPY;  Service: Endoscopy;  Laterality: N/A;  . DIAGNOSTIC LAPAROSCOPY    . ESOPHAGOGASTRODUODENOSCOPY N/A 07/19/2015   Procedure: ESOPHAGOGASTRODUODENOSCOPY (EGD);  Surgeon: Hulen Luster, MD;  Location: [facility] ENDOSCOPY;  Service: Gastroenterology;  Laterality: N/A;  . ESOPHAGOGASTRODUODENOSCOPY (EGD) WITH PROPOFOL N/A 03/28/2015   Procedure: ESOPHAGOGASTRODUODENOSCOPY (EGD) WITH PROPOFOL;  Surgeon: Robert Bellow, MD;  Location: [facility] ENDOSCOPY;  Service: Endoscopy;  Laterality: N/A;  . ESOPHAGOGASTRODUODENOSCOPY (EGD) WITH PROPOFOL N/A 06/10/2015   Procedure: ESOPHAGOGASTRODUODENOSCOPY (EGD) WITH PROPOFOL;  Surgeon: Lollie Sails, MD;  Location: [facility] ENDOSCOPY;  Service: Endoscopy;  Laterality: N/A;  . ESOPHAGOGASTRODUODENOSCOPY (EGD) WITH PROPOFOL N/A 10/20/2015   Procedure: ESOPHAGOGASTRODUODENOSCOPY (EGD) WITH PROPOFOL;  Surgeon: Lollie Sails, MD;  Location: [facility] ENDOSCOPY;  Service: Endoscopy;  Laterality: N/A;  . ESOPHAGOGASTRODUODENOSCOPY (EGD) WITH PROPOFOL N/A 02/23/2016   Procedure: ESOPHAGOGASTRODUODENOSCOPY (EGD) WITH PROPOFOL;  Surgeon: Lollie Sails, MD;  Location: [facility] ENDOSCOPY;  Service: Endoscopy;  Laterality: N/A;  . ESOPHAGOGASTRODUODENOSCOPY (EGD) WITH PROPOFOL N/A 06/30/2016   Procedure: ESOPHAGOGASTRODUODENOSCOPY (EGD) WITH PROPOFOL;  Surgeon: Lollie Sails, MD;  Location: [facility] ENDOSCOPY;  Service: Endoscopy;  Laterality: N/A;  . ESOPHAGOGASTRODUODENOSCOPY (EGD) WITH PROPOFOL N/A 01/05/2017   Procedure: ESOPHAGOGASTRODUODENOSCOPY (EGD) WITH PROPOFOL;  Surgeon: Lollie Sails, MD;  Location:  [facility] ENDOSCOPY;  Service: Endoscopy;  Laterality: N/A;  . KYPHOPLASTY N/A 02/10/2015   Procedure: VVOHYWVPXTG/G2;  Surgeon: Hessie Knows, MD;  Location: [facility] ORS;  Service: Orthopedics;  Laterality: N/A;  . LAPAROTOMY N/A 03/11/2015   Procedure: REDUCTION OF GASTRIC VOLVULUS, SPLEENECTOMY, GASTRIC TUBE PLACEMENT;  Surgeon: Robert Bellow, MD;  Location: [facility] ORS;  Service: General;  Laterality: N/A;  . MOHS SURGERY    . PERIPHERAL VASCULAR CATHETERIZATION N/A 06/12/2015   Procedure: IVC Filter Insertion;  Surgeon: Algernon Huxley, MD;  Location: [place] CV LAB;  Service: Cardiovascular;  Laterality: N/A;  . PERIPHERAL VASCULAR CATHETERIZATION N/A 08/23/2016   Procedure: IVC Filter Removal;  Surgeon: Katha Cabal, MD;  Location: [place] CV LAB;  Service: Cardiovascular;  Laterality: N/A;  . STOMACH SURGERY     for volvolus    Family History  Problem Relation Age of Onset  . Colon cancer Mother   . Cirrhosis Brother        alcoholic  . Alcohol abuse Sister   . Breast cancer Sister 43  . Cancer Brother   . Lung cancer Son   . Meniere's disease Son   . Cirrhosis Brother        nonalcoholic  SOCIAL ZH:GDJMEQAST from 2 weeks ago    Current Outpatient Medications:  .  ALPRAZolam (XANAX) 0.25 MG tablet, Take 1 tablet (0.25 mg total) by mouth 2 (two) times daily as needed for anxiety., Disp: 20 tablet, Rfl: 5 .  Ascorbic Acid (VITAMIN C) 1000 MG tablet, Take 1,000 mg by mouth daily., Disp: , Rfl:  .  Calcium Carbonate-Vitamin D 600-400 MG-UNIT tablet, Take 1 tablet by mouth daily., Disp: , Rfl:  .  cyanocobalamin (,VITAMIN B-12,) 1000 MCG/ML injection, Inject 1 mL (1,000 mcg total) into the muscle every 30 (thirty) days. Pt uses on the 1st of every month., Disp: 10 mL, Rfl: 1 .  fentaNYL (DURAGESIC) 50 MCG/HR, Place 1 patch onto the skin every 3 (three) days., Disp: 10 patch, Rfl: 0 .  hydroxychloroquine (PLAQUENIL) 200 MG tablet, Take 200 mg by mouth daily., Disp: , Rfl:   .  Lactulose 20 GM/30ML SOLN, 30 ml every 4 hours until constipation is relieved, Disp: 236 mL, Rfl: 3 .  letrozole (FEMARA) 2.5 MG tablet, TAKE 1 TABLET BY MOUTH EVERY DAY, Disp: 90 tablet, Rfl: 1 .  lisinopril (PRINIVIL,ZESTRIL) 5 MG tablet, TAKE 1 TABLET (5 MG TOTAL) BY MOUTH DAILY., Disp: 90 tablet, Rfl: 3 .  metoCLOPramide (REGLAN) 10 MG tablet, TAKE 1 TABLET BY MOUTH 4 TIMES DAILY - BEFORE MEALS AND AT BEDTIME., Disp: 360 tablet, Rfl: 0 .  metoprolol succinate (TOPROL-XL) 25 MG 24 hr tablet, Take 12.5 mg by mouth daily. , Disp: , Rfl:  .  mirtazapine (REMERON) 15 MG tablet, 1/2 TO 1 TABLET 30 MINUTES BEFORE BEDTIME DAILY, Disp: 90 tablet, Rfl: 0 .  mycophenolate (CELLCEPT) 500 MG tablet, Take 1,000 mg by mouth 2 (two) times daily., Disp: , Rfl:  .  ondansetron (ZOFRAN ODT) 4 MG disintegrating tablet, Take 1 tablet (4 mg total) by mouth every 8 (eight) hours as needed for nausea or vomiting., Disp: 20 tablet, Rfl: 0 .  oxyCODONE-acetaminophen (PERCOCET/ROXICET) 5-325 MG tablet, Take 1 tablet by mouth every 8 (eight) hours as needed for severe pain., Disp: 65 tablet, Rfl: 0 .  OXYGEN, Inhale 2 mLs into the lungs at bedtime., Disp: , Rfl:  .  pantoprazole (PROTONIX) 40 MG tablet, Take 40 mg by mouth 2 (two) times daily., Disp: , Rfl:  .  Polyvinyl Alcohol-Povidone (REFRESH OP), Place 2 drops into both eyes daily as needed (dry eyes). , Disp: , Rfl:  .  predniSONE (DELTASONE) 5 MG tablet, Take 5 mg by mouth daily. , Disp: , Rfl:  .  prochlorperazine (COMPAZINE) 5 MG tablet, Take 1 tablet (5 mg total) by mouth every 6 (six) hours as needed for nausea or vomiting., Disp: 30 tablet, Rfl: 5 .  Saccharomyces boulardii (PROBIOTIC) 250 MG CAPS, Take 1 tablet by mouth daily., Disp: 14 capsule, Rfl:  .  sucralfate (CARAFATE) 1 GM/10ML suspension, Take 1 g by mouth 4 (four) times daily. Before meals and nightly, Disp: , Rfl:  .  SYNTHROID 125 MCG tablet, TAKE 1 TABLET BY MOUTH EVERY DAY, Disp: 90 tablet,  Rfl: 1 .  SYNTHROID 137 MCG tablet, TAKE 1 TABLET BY MOUTH EVERY DAY, Disp: 90 tablet, Rfl: 0 .  Syringe/Needle, Disp, (SYRINGE 3CC/25GX1") 25G X 1" 3 ML MISC, Use for b12 injections, Disp: 50 each, Rfl: 0 .  Tadalafil, PAH, (ADCIRCA) 20 MG TABS, Take 40 mg by mouth daily., Disp: , Rfl:  .  warfarin (COUMADIN) 3 MG tablet, Take 1 tablet (3 mg total) by mouth daily., Disp: 90 tablet,  Rfl: 1 .  warfarin (COUMADIN) 6 MG tablet, Take 1 tablet (6 mg total) by mouth daily. As directed.  PATIENT NEEDS TODAY, Disp: 30 tablet, Rfl: 2 .  amoxicillin-clavulanate (AUGMENTIN) 875-125 MG tablet, Take 1 tablet by mouth 2 (two) times daily., Disp: 14 tablet, Rfl: 0 .  predniSONE (DELTASONE) 10 MG tablet, 6 tablets on Day 1 , then reduce by 1 tablet daily until gone, Disp: 21 tablet, Rfl: 0  EXAM:   General impression: alert, cooperative and articulate.  No signs of being in distress  Lungs: speech is fluent sentence length suggests that patient is not short of breath and not punctuated by cough, sneezing or sniffing. Marland Kitchen   Psych: affect normal.  speech is articulate and non pressured .  Denies suicidal thoughts    ASSESSMENT AND PLAN:  Discussed the following assessment and plan:  Acute non-recurrent frontal sinusitis  Anticoagulation goal of INR 2 to 3  Other specified hypothyroidism  Type 2 diabetes mellitus without complication, without long-term current use of insulin (HCC)  Acute frontal sinusitis Given chronicity of symptoms, development of facial pain and exam consistent with bacterial URI,  Will treat with empiric antibiotics, steroid taper,  and saline lavage.  Continued use of probiotic advised.   Anticoagulation goal of INR 2 to 3 Managed with weekly serial measurements reported using MD INR   INR was  3.7 on April 30 due to patient error of  Doubling dose  Earlier in the week,  Which was adressed by suspension of medication for two days.  Her current regimen is 6 mg 5 days per week and 3  mg 2 days per week  For a total weekly dose of 36 mg weekly  Lab Results  Component Value Date   INR 3.7 (A) 01/17/2019   INR 2.1 (A) 12/26/2018   INR 1.8 (A) 12/19/2018     Hypothyroidism TSH was suppressed in January and dose was reduced to 125 mcg in late January.  Repeat TSH is due  Now and has been ordered  But not drawn by the cancer center   Lab Results  Component Value Date   TSH 0.105 (L) 10/09/2018     Type 2 diabetes mellitus without complications ([place]) Managed with diet since diagnosis several years ago..  She is taking an ACE inhibitor  For management of microalbuminuria .  Has SLE nephropathy as well .  Aspirin C/I due to use of warfarin.  LDL was < 70  In 2015 , and repeat screening for hyperlipidemia has not been done in several years due to serious comorbidities  Lab Results  Component Value Date   HGBA1C 6.4 08/09/2018   Lab Results  Component Value Date   MICROALBUR 8.7 (H) 09/14/2016      Lab Results  Component Value Date   CHOL 164 10/11/2013   HDL 69.70 10/11/2013   LDLCALC 64 10/11/2013   LDLDIRECT 50.0 05/09/2016   TRIG 152.0 (H) 10/11/2013   CHOLHDL 2 10/11/2013       I discussed the assessment and treatment plan with the patient. The patient was provided an opportunity to ask questions and all were answered. The patient agreed with the plan and demonstrated an understanding of the instructions.   The patient was advised to call back or seek an in-person evaluation if the symptoms worsen or if the condition fails to improve as anticipated.  I provided 25 minutes of non-face-to-face time during this encounter.   Crecencio Mc, MD

## 2019-01-21 NOTE — Assessment & Plan Note (Addendum)
Given chronicity of symptoms, development of facial pain and exam consistent with bacterial URI,  Will treat with empiric antibiotics, steroid taper,  and saline lavage.  Continued use of probiotic advised.

## 2019-01-21 NOTE — Assessment & Plan Note (Signed)
TSH was suppressed in January and dose was reduced to 125 mcg in late January.  Repeat TSH is due  Now and has been ordered  But not drawn by the cancer center   Lab Results  Component Value Date   TSH 0.105 (L) 10/09/2018

## 2019-01-21 NOTE — Assessment & Plan Note (Signed)
Managed with diet since diagnosis several years ago..  She is taking an ACE inhibitor  For management of microalbuminuria .  Has SLE nephropathy as well .  Aspirin C/I due to use of warfarin.  LDL was < 70  In 2015 , and repeat screening for hyperlipidemia has not been done in several years due to serious comorbidities  Lab Results  Component Value Date   HGBA1C 6.4 08/09/2018   Lab Results  Component Value Date   MICROALBUR 8.7 (H) 09/14/2016      Lab Results  Component Value Date   CHOL 164 10/11/2013   HDL 69.70 10/11/2013   LDLCALC 64 10/11/2013   LDLDIRECT 50.0 05/09/2016   TRIG 152.0 (H) 10/11/2013   CHOLHDL 2 10/11/2013

## 2019-01-23 ENCOUNTER — Telehealth: Payer: Self-pay

## 2019-01-23 DIAGNOSIS — Z7901 Long term (current) use of anticoagulants: Secondary | ICD-10-CM | POA: Diagnosis not present

## 2019-01-23 LAB — POCT INR: INR: 2 — AB (ref 0.9–1.1)

## 2019-01-23 NOTE — Telephone Encounter (Signed)
INR result from 01/23/2019 is 2.0. Pt is currently taking 3mg  on Monday and Friday and 6mg  all other days.

## 2019-01-24 ENCOUNTER — Ambulatory Visit: Payer: Medicare Other | Admitting: Internal Medicine

## 2019-01-28 ENCOUNTER — Other Ambulatory Visit: Payer: Self-pay | Admitting: *Deleted

## 2019-01-28 DIAGNOSIS — D631 Anemia in chronic kidney disease: Secondary | ICD-10-CM

## 2019-01-28 DIAGNOSIS — E038 Other specified hypothyroidism: Secondary | ICD-10-CM

## 2019-01-28 DIAGNOSIS — E119 Type 2 diabetes mellitus without complications: Secondary | ICD-10-CM

## 2019-01-30 ENCOUNTER — Telehealth: Payer: Self-pay | Admitting: Internal Medicine

## 2019-01-30 ENCOUNTER — Encounter: Payer: Self-pay | Admitting: Internal Medicine

## 2019-01-30 DIAGNOSIS — Z5181 Encounter for therapeutic drug level monitoring: Secondary | ICD-10-CM

## 2019-01-30 LAB — POCT INR: INR: 4.3 — AB (ref 2.0–3.0)

## 2019-01-30 NOTE — Assessment & Plan Note (Signed)
INR  4.3  Called after hours to PCP.  Regimen is 6 mg warfarin x 5 days and 3 mg  warfarin x 2 days/week.  Patient has recently finished taking an antibiotic (augmentin) for sinusitis.  She has suspended warfarin dose for one day and will reduce dose tomorrow to 3 mg. Resume prior regimen starting Friday and repeat INR one week using the MD INR weekly home test.

## 2019-01-30 NOTE — Telephone Encounter (Signed)
INR  4.3  Called after hours to PCP.  Regimen is 6 mg warfarin x 5 days and 3 mg  warfarin x 2 days/week.  Patient has recently finished taking an antibiotic (augmentin) for sinusitis.  She has suspended warfarin dose for one day and will reduce dose tomorrow to 3 mg. Resume prior regimen starting Friday and repeat INR one week using the MD INR weekly home test.

## 2019-01-30 NOTE — Telephone Encounter (Signed)
Received call from agent with MD INR results but call was dropped before transferred to nurse triage.Returned call to Diana [name] from [facility] IN[facility] at [phone] to receive out of range results INR. Spoke with Nira Conn who states that INR -4.3. Results read back and repeated. On call provider, Dr. Derrel Nip called and notified of INR-4.3.

## 2019-01-31 ENCOUNTER — Telehealth: Payer: Self-pay

## 2019-01-31 NOTE — Telephone Encounter (Signed)
Patient was notified last night and advised to suspend last nights dose,  reduce tonight's dose to 3 mg,  And repeat INR at home in one week .

## 2019-02-03 ENCOUNTER — Other Ambulatory Visit: Payer: Self-pay

## 2019-02-04 ENCOUNTER — Inpatient Hospital Stay: Payer: Medicare Other

## 2019-02-04 ENCOUNTER — Other Ambulatory Visit: Payer: Self-pay

## 2019-02-04 ENCOUNTER — Inpatient Hospital Stay: Payer: Medicare Other | Attending: Oncology

## 2019-02-04 VITALS — BP 102/62

## 2019-02-04 DIAGNOSIS — D631 Anemia in chronic kidney disease: Secondary | ICD-10-CM

## 2019-02-04 DIAGNOSIS — Z923 Personal history of irradiation: Secondary | ICD-10-CM | POA: Diagnosis not present

## 2019-02-04 DIAGNOSIS — Z79899 Other long term (current) drug therapy: Secondary | ICD-10-CM | POA: Insufficient documentation

## 2019-02-04 DIAGNOSIS — Z170 Estrogen receptor positive status [ER+]: Secondary | ICD-10-CM | POA: Diagnosis not present

## 2019-02-04 DIAGNOSIS — Z87891 Personal history of nicotine dependence: Secondary | ICD-10-CM | POA: Diagnosis not present

## 2019-02-04 DIAGNOSIS — M1990 Unspecified osteoarthritis, unspecified site: Secondary | ICD-10-CM | POA: Insufficient documentation

## 2019-02-04 DIAGNOSIS — N189 Chronic kidney disease, unspecified: Secondary | ICD-10-CM | POA: Insufficient documentation

## 2019-02-04 DIAGNOSIS — Z7901 Long term (current) use of anticoagulants: Secondary | ICD-10-CM | POA: Insufficient documentation

## 2019-02-04 DIAGNOSIS — Z79811 Long term (current) use of aromatase inhibitors: Secondary | ICD-10-CM | POA: Insufficient documentation

## 2019-02-04 DIAGNOSIS — Z86718 Personal history of other venous thrombosis and embolism: Secondary | ICD-10-CM | POA: Insufficient documentation

## 2019-02-04 DIAGNOSIS — C50412 Malignant neoplasm of upper-outer quadrant of left female breast: Secondary | ICD-10-CM | POA: Insufficient documentation

## 2019-02-04 DIAGNOSIS — E038 Other specified hypothyroidism: Secondary | ICD-10-CM

## 2019-02-04 DIAGNOSIS — E119 Type 2 diabetes mellitus without complications: Secondary | ICD-10-CM

## 2019-02-04 DIAGNOSIS — N183 Chronic kidney disease, stage 3 unspecified: Secondary | ICD-10-CM

## 2019-02-04 LAB — CBC
HCT: 29.6 % — ABNORMAL LOW (ref 36.0–46.0)
Hemoglobin: 8.5 g/dL — ABNORMAL LOW (ref 12.0–15.0)
MCH: 31 pg (ref 26.0–34.0)
MCHC: 28.7 g/dL — ABNORMAL LOW (ref 30.0–36.0)
MCV: 108 fL — ABNORMAL HIGH (ref 80.0–100.0)
Platelets: 308 10*3/uL (ref 150–400)
RBC: 2.74 MIL/uL — ABNORMAL LOW (ref 3.87–5.11)
RDW: 16.1 % — ABNORMAL HIGH (ref 11.5–15.5)
WBC: 5 10*3/uL (ref 4.0–10.5)
nRBC: 0 % (ref 0.0–0.2)

## 2019-02-04 LAB — TSH: TSH: 0.107 u[IU]/mL — ABNORMAL LOW (ref 0.350–4.500)

## 2019-02-04 MED ORDER — DARBEPOETIN ALFA 300 MCG/0.6ML IJ SOSY
300.0000 ug | PREFILLED_SYRINGE | INTRAMUSCULAR | Status: AC
  Administered 2019-02-04: 300 ug via SUBCUTANEOUS
  Filled 2019-02-04: qty 0.6

## 2019-02-05 LAB — HEMOGLOBIN A1C
Hgb A1c MFr Bld: 5.8 % — ABNORMAL HIGH (ref 4.8–5.6)
Mean Plasma Glucose: 119.76 mg/dL

## 2019-02-06 ENCOUNTER — Other Ambulatory Visit: Payer: Self-pay | Admitting: Internal Medicine

## 2019-02-06 LAB — POCT INR: INR: 2.6 — AB (ref <=1.1)

## 2019-02-07 ENCOUNTER — Telehealth: Payer: Self-pay

## 2019-02-07 DIAGNOSIS — E038 Other specified hypothyroidism: Secondary | ICD-10-CM

## 2019-02-07 NOTE — Telephone Encounter (Signed)
Coumadin level is therapeutic,  Continue current regimen and repeat PT/INR in one week 

## 2019-02-07 NOTE — Telephone Encounter (Signed)
Pt's INR is 2.6. Pt is currently taking 6mg  5 times a week and taking 3mg  2 times a week.   Abstracted

## 2019-02-08 MED ORDER — SYNTHROID 125 MCG PO TABS: 125.0000 ug | ORAL_TABLET | Freq: Every day | ORAL | 1 refills | Status: DC

## 2019-02-08 MED ORDER — MYCOPHENOLATE MOFETIL 500 MG TABLET
ORAL_TABLET | Freq: Two times a day (BID) | ORAL | 1 refills | 0 days | Status: CP
Start: 2019-02-08 — End: 2019-04-09

## 2019-02-08 NOTE — Telephone Encounter (Signed)
Patient called.  She never lowered her dose in April  April so she has been taking 137 mcg.  Advised to stop mediation and start 125 mg c dose.  Resent to pharmacy

## 2019-02-08 NOTE — Addendum Note (Signed)
Addended by: Crecencio Mc on: 02/08/2019 04:53 PM   Modules accepted: Orders

## 2019-02-08 NOTE — Assessment & Plan Note (Signed)
Patient called.  She never lowered her dose in April , so she has been taking 137 mcg.  Advised to stop medication and start 125 mg  dose.  Resent to pharmacy

## 2019-02-08 NOTE — Telephone Encounter (Signed)
Spoke with pt and informed her that she needs to continue taking her current coumadin dose because her INR result was therapeutic. Pt stated that she had her labs drawn at the cancer center on Monday and they checked her TSH. Pt would like to see if you would take a look at it because her dose was changed the last time and she would like to know if it is high or low this time.

## 2019-02-14 ENCOUNTER — Telehealth: Payer: Self-pay | Admitting: *Deleted

## 2019-02-14 ENCOUNTER — Other Ambulatory Visit: Payer: Self-pay

## 2019-02-14 LAB — POCT INR: INR: 4.1 — AB (ref <=1.1)

## 2019-02-14 NOTE — Telephone Encounter (Signed)
INR is too high.  Has she been taking an antibiotic? Let me know.  If not taking  antibiotic:  Suspend coumadin for today (one day)  New regimen going forward:  3 mg on Friday Sunday tues and Thursday 6 mg on Saturday and Monday  Repeat INR 1 week

## 2019-02-14 NOTE — Telephone Encounter (Signed)
Spoke with pt and informed her of her INR result and coumadin dose change. Pt gave a verbal understanding.

## 2019-02-14 NOTE — Telephone Encounter (Signed)
Call report for abnormal lab:  Abnormal INR- 4.1 on 02/13/19

## 2019-02-18 ENCOUNTER — Other Ambulatory Visit: Payer: Self-pay | Admitting: Internal Medicine

## 2019-02-18 NOTE — Telephone Encounter (Signed)
Copied from [place] [id number]. Topic: Quick Communication - Rx Refill/Question >> Feb 18, 2019  1:05 PM Rainey Pines A wrote: Medication:  fentaNYL ([place]) 50 MCG/HR  Has the patient contacted their pharmacy?Yes (Agent: If no, request that the patient contact the pharmacy for the refill.) (Agent: If yes, when and what did the pharmacy advise?)Contact PCP  Preferred Pharmacy (with phone number or street name): [id number] [address], [place] [phone] (Phone) [phone] (Fax)    Agent: Please be advised that RX refills may take up to 3 business days. We ask that you follow-up with your pharmacy.

## 2019-02-19 MED ORDER — FENTANYL 50 MCG/HR TD PT72: 1.0000 | MEDICATED_PATCH | TRANSDERMAL | 0 refills | Status: DC

## 2019-02-21 ENCOUNTER — Institutional Professional Consult (permissible substitution): Admit: 2019-02-21 | Discharge: 2019-02-22 | Payer: MEDICARE | Attending: Nephrology | Primary: Nephrology

## 2019-02-21 DIAGNOSIS — Z7901 Long term (current) use of anticoagulants: Secondary | ICD-10-CM | POA: Diagnosis not present

## 2019-02-21 DIAGNOSIS — Z86718 Personal history of other venous thrombosis and embolism: Secondary | ICD-10-CM | POA: Diagnosis not present

## 2019-02-21 DIAGNOSIS — R109 Unspecified abdominal pain: Secondary | ICD-10-CM | POA: Diagnosis not present

## 2019-02-21 DIAGNOSIS — I272 Pulmonary hypertension, unspecified: Secondary | ICD-10-CM | POA: Diagnosis not present

## 2019-02-21 DIAGNOSIS — D649 Anemia, unspecified: Secondary | ICD-10-CM | POA: Diagnosis not present

## 2019-02-21 DIAGNOSIS — Z87891 Personal history of nicotine dependence: Secondary | ICD-10-CM | POA: Diagnosis not present

## 2019-02-21 DIAGNOSIS — M3214 Glomerular disease in systemic lupus erythematosus: Secondary | ICD-10-CM | POA: Diagnosis not present

## 2019-02-21 LAB — POCT INR: INR: 3.5 — AB (ref <=1.1)

## 2019-02-22 ENCOUNTER — Telehealth: Payer: Self-pay

## 2019-02-22 DIAGNOSIS — Z7901 Long term (current) use of anticoagulants: Secondary | ICD-10-CM

## 2019-02-22 NOTE — Telephone Encounter (Signed)
INR result from 02/21/2019 is 3.5. Pt is currently taking 3mg  on Friday Sunday Tuesday and Thursday and 6mg  on Saturday and Monday.    Abstracted

## 2019-02-23 ENCOUNTER — Other Ambulatory Visit: Payer: Self-pay | Admitting: Oncology

## 2019-02-23 DIAGNOSIS — C50412 Malignant neoplasm of upper-outer quadrant of left female breast: Secondary | ICD-10-CM

## 2019-02-23 NOTE — Assessment & Plan Note (Signed)
Patient called on Saturday by Dr Derrel Nip.  Advised to reduce dose to 3 mg all days  ( 21 mg weekly,  Which is a 6 mg weekly reduction) for INR of 3.5 (goal 2-3)  And repeat INR after one week

## 2019-02-23 NOTE — Telephone Encounter (Signed)
Patient called on Saturday by Dr Derrel Nip.  Advised to reduce dose to 3 mg all days  ( 21 mg weekly,  Which is a 6 mg weekly reduction) for INR of 3.5 (goal 2-3)  And repeat INR after one week

## 2019-02-25 ENCOUNTER — Other Ambulatory Visit: Payer: Self-pay

## 2019-02-25 ENCOUNTER — Inpatient Hospital Stay: Payer: Medicare Other

## 2019-02-25 ENCOUNTER — Inpatient Hospital Stay (HOSPITAL_BASED_OUTPATIENT_CLINIC_OR_DEPARTMENT_OTHER): Payer: Medicare Other | Admitting: Oncology

## 2019-02-25 ENCOUNTER — Encounter: Payer: Self-pay | Admitting: Oncology

## 2019-02-25 ENCOUNTER — Inpatient Hospital Stay: Payer: Medicare Other | Attending: Oncology

## 2019-02-25 VITALS — BP 94/59 | HR 97 | Temp 98.9°F | Wt 104.6 lb

## 2019-02-25 DIAGNOSIS — Z170 Estrogen receptor positive status [ER+]: Secondary | ICD-10-CM | POA: Diagnosis not present

## 2019-02-25 DIAGNOSIS — Z862 Personal history of diseases of the blood and blood-forming organs and certain disorders involving the immune mechanism: Secondary | ICD-10-CM

## 2019-02-25 DIAGNOSIS — C50412 Malignant neoplasm of upper-outer quadrant of left female breast: Secondary | ICD-10-CM | POA: Diagnosis not present

## 2019-02-25 DIAGNOSIS — N189 Chronic kidney disease, unspecified: Secondary | ICD-10-CM | POA: Insufficient documentation

## 2019-02-25 DIAGNOSIS — Z79811 Long term (current) use of aromatase inhibitors: Secondary | ICD-10-CM

## 2019-02-25 DIAGNOSIS — Z08 Encounter for follow-up examination after completed treatment for malignant neoplasm: Secondary | ICD-10-CM

## 2019-02-25 DIAGNOSIS — D631 Anemia in chronic kidney disease: Secondary | ICD-10-CM

## 2019-02-25 DIAGNOSIS — N183 Chronic kidney disease, stage 3 unspecified: Secondary | ICD-10-CM

## 2019-02-25 DIAGNOSIS — Z79899 Other long term (current) drug therapy: Secondary | ICD-10-CM

## 2019-02-25 DIAGNOSIS — Z853 Personal history of malignant neoplasm of breast: Secondary | ICD-10-CM

## 2019-02-25 DIAGNOSIS — D649 Anemia, unspecified: Secondary | ICD-10-CM

## 2019-02-25 LAB — CBC WITH DIFFERENTIAL/PLATELET
Abs Immature Granulocytes: 0 10*3/uL (ref 0.00–0.07)
Basophils Absolute: 0.1 10*3/uL (ref 0.0–0.1)
Basophils Relative: 1 %
Eosinophils Absolute: 0.1 10*3/uL (ref 0.0–0.5)
Eosinophils Relative: 3 %
HCT: 32.1 % — ABNORMAL LOW (ref 36.0–46.0)
Hemoglobin: 9.2 g/dL — ABNORMAL LOW (ref 12.0–15.0)
Immature Granulocytes: 0 %
Lymphocytes Relative: 12 %
Lymphs Abs: 0.6 10*3/uL — ABNORMAL LOW (ref 0.7–4.0)
MCH: 31.4 pg (ref 26.0–34.0)
MCHC: 28.7 g/dL — ABNORMAL LOW (ref 30.0–36.0)
MCV: 109.6 fL — ABNORMAL HIGH (ref 80.0–100.0)
Monocytes Absolute: 0.5 10*3/uL (ref 0.1–1.0)
Monocytes Relative: 11 %
Neutro Abs: 3.3 10*3/uL (ref 1.7–7.7)
Neutrophils Relative %: 73 %
Platelets: 334 10*3/uL (ref 150–400)
RBC: 2.93 MIL/uL — ABNORMAL LOW (ref 3.87–5.11)
RDW: 15.9 % — ABNORMAL HIGH (ref 11.5–15.5)
WBC: 4.6 10*3/uL (ref 4.0–10.5)
nRBC: 0.9 % — ABNORMAL HIGH (ref 0.0–0.2)

## 2019-02-25 MED ORDER — DARBEPOETIN ALFA 300 MCG/0.6ML IJ SOSY
300.0000 ug | PREFILLED_SYRINGE | INTRAMUSCULAR | Status: AC
  Administered 2019-02-25: 300 ug via SUBCUTANEOUS
  Filled 2019-02-25: qty 0.6

## 2019-02-25 NOTE — Progress Notes (Addendum)
Hematology/Oncology Consult note [facility]  [phone] [phone]  Patient Care Team: Sherlene Shams, MD as PCP - General (Internal Medicine) Lemar Livings Merrily Pew, MD (General Surgery) Darci Current, MD as Attending Physician (Emergency Medicine)   Name of the patient: Diana [name]  [id number]  10-05-1934   Date of visit: 02/25/19  Diagnosis- 1.  Left breast cancer on letrozole 2.  Anemia of chronic kidney disease  Chief complaint/ Reason for visit-routine follow-up of anemia  Heme/Onc history:  Oncology History   84 year old female with stage I (T1 C. N0 M0) invasive mammary carcinoma status post wide local excision and sentinel node biopsy tumor is ER/PR positive HER-2/neu not over expressed with a low Oncotype DX score of 17 for adjuvant whole breast radiation Oncotype DX score is 17 Patient has finished radiation therapy in December of 2015 Started letrozole from December, 2015     Breast cancer, left (HCC)   05/26/2014 Initial Diagnosis    Breast cancer of upper-outer quadrant of left female breast (HCC), stage I, T1 cN0 M0, ER/PR positive HER-2/neu not overexpressed.    06/06/2014 Oncotype testing    Oncotype DX score 17     - 09/05/2014 Radiation Therapy       09/05/2014 -  Anti-estrogen oral therapy    Started letrozole    Patient has been having progressive anemia especially over the last 6 months her hemoglobin is drifted down to 8 from her baseline of 10.Results of anemia work-up from 10/09/2018 are as follows: CBC showed a white count of 5.6, H&H of 8.1/27.5 with an MCV of 100 and a platelet count of 315. CMP was significant for elevated creatinine of 1.4. B12 folate were normal. TSH was mildly low at 0.1. Multiple myeloma panel showed no M protein. Polyclonal gammopathy. Haptoglobin was elevated at 344. Reticulocyte count was 2.1% which is inappropriately low for the degree of anemia indicative of  hypoproliferative anemia. Iron studies showed iron saturation of 21% and ferritin of 111.  No significant improvement in anemia after Feraheme.  She has been getting Aranesp for anemia of kidney disease   Interval history-patient reports her energy level was slightly improved after she started getting Aranesp.  She continues to have intermittent abdominal pain and she has been trying to modify her diet.  She sees pulmonology clinic GI.  She hit her left leg against the wall which caused a big hematoma over her leg.  She also has bilateral lower extremity swelling for which she takes Lasix she has also noted a new rash over her bilateral cheeks and she will be talking to her primary care doctor as well as her lupus doctor soon  ECOG PS- 2 Pain scale- 0   Review of systems- Review of Systems  Constitutional: Positive for malaise/fatigue. Negative for chills, fever and weight loss.  HENT: Negative for congestion, ear discharge and nosebleeds.   Eyes: Negative for blurred vision.  Respiratory: Negative for cough, hemoptysis, sputum production, shortness of breath and wheezing.   Cardiovascular: Positive for leg swelling. Negative for chest pain, palpitations, orthopnea and claudication.  Gastrointestinal: Positive for abdominal pain. Negative for blood in stool, constipation, diarrhea, heartburn, melena, nausea and vomiting.  Genitourinary: Negative for dysuria, flank pain, frequency, hematuria and urgency.  Musculoskeletal: Negative for back pain, joint pain and myalgias.  Skin: Negative for rash.  Neurological: Negative for dizziness, tingling, focal weakness, seizures, weakness and headaches.  Endo/Heme/Allergies: Does not bruise/bleed easily.  Psychiatric/Behavioral: Negative for  depression and suicidal ideas. The patient does not have insomnia.       Allergies  Allergen Reactions   Iodinated Diagnostic Agents Hives    Pt states she broke out in hives years ago, has been premedicated  in the past.    Tramadol Other (See Comments) and Rash    Rash   Amoxicillin Other (See Comments)    Reaction:  Stomach cramps    Cefuroxime Itching   Metrizamide Hives   Morphine And Related     Reaction: face flushed     Propoxyphene     Stomach cramp   Cephalexin Rash   Sulfa Antibiotics Rash    REACTION: Rash   Sulfonamide Derivatives Rash   Tramadol Hcl Rash   Venlafaxine Rash     Past Medical History:  Diagnosis Date   Acute glomerulonephritis with other specified pathological lesion in kidney in disease classified elsewhere(580.81)    Alveolar aeration decreased    Anemia    Arthritis    Atrial fibrillation (HCC)    Breast cancer (HCC) 06/03/2014   positive, radiation   Cancer (HCC)    Breast   CHF (congestive heart failure) (HCC)    Diaphragmatic hernia    DVT (deep venous thrombosis) (HCC)    Dysrhythmia    Environmental allergies    Erosive esophagitis 03/28/2015   esophageal biopsy July 2017 c/w CMV cytopathic changes, negative for H Pylori and dysplasia      Esophageal reflux    Esophagitis, CMV (HCC) 05/26/2015   Diagnosed at [facility] with manometry,  Tube feeds started for enteral nutrtion 05/12/15    Feeding problem    FEEDING TUBE   Hiatal hernia    Hyperlipidemia    Hypertension    Lower extremity edema    Lupus (systemic lupus erythematosus) (HCC)    Neuropathy    Osteopenia    Oxygen deficiency    USES HS   Peripheral neuropathy, hereditary/idiopathic    Personal history of radiation therapy    Pneumonia    Primary pulmonary hypertension (HCC)    Pulmonary hypertension (HCC)    Renal insufficiency    Sciatica    Shingles    Shingles (herpes zoster) polyneuropathy March 2013   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   Shortness of breath dyspnea    Unspecified hypothyroidism    Unspecified menopausal and postmenopausal disorder    Urinary incontinence without sensory awareness    Vitamin B deficiency       Past Surgical History:  Procedure Laterality Date   BREAST BIOPSY Left 2015   invasive mammary carcinoma   BREAST LUMPECTOMY Left 2015   invasive mammary carcinoma clear margins but close   BREAST SURGERY     CATARACT EXTRACTION W/PHACO Right 04/05/2016   Procedure: CATARACT EXTRACTION PHACO AND INTRAOCULAR LENS PLACEMENT (IOC);  Surgeon: Galen Manila, MD;  Location: [facility] ORS;  Service: Ophthalmology;  Laterality: Right;  [place] 01:20 AP% 23.9 CDE 19.29 fluid pack lot # [id number]   CATARACT EXTRACTION W/PHACO Left 04/26/2016   Procedure: CATARACT EXTRACTION PHACO AND INTRAOCULAR LENS PLACEMENT (IOC);  Surgeon: Galen Manila, MD;  Location: [facility] ORS;  Service: Ophthalmology;  Laterality: Left;   [place] 00:59 AP% 23.2 CDE 13.83 Fluid pack lot # [id number]   COLONOSCOPY WITH PROPOFOL N/A 06/04/2018   Procedure: COLONOSCOPY WITH PROPOFOL;  Surgeon: Christena Deem, MD;  Location: [facility] ENDOSCOPY;  Service: Endoscopy;  Laterality: N/A;   DIAGNOSTIC LAPAROSCOPY     ESOPHAGOGASTRODUODENOSCOPY N/A 07/19/2015   Procedure: ESOPHAGOGASTRODUODENOSCOPY (  EGD);  Surgeon: Wallace Cullens, MD;  Location: [facility] ENDOSCOPY;  Service: Gastroenterology;  Laterality: N/A;   ESOPHAGOGASTRODUODENOSCOPY (EGD) WITH PROPOFOL N/A 03/28/2015   Procedure: ESOPHAGOGASTRODUODENOSCOPY (EGD) WITH PROPOFOL;  Surgeon: Earline Mayotte, MD;  Location: [facility] ENDOSCOPY;  Service: Endoscopy;  Laterality: N/A;   ESOPHAGOGASTRODUODENOSCOPY (EGD) WITH PROPOFOL N/A 06/10/2015   Procedure: ESOPHAGOGASTRODUODENOSCOPY (EGD) WITH PROPOFOL;  Surgeon: Christena Deem, MD;  Location: [facility] ENDOSCOPY;  Service: Endoscopy;  Laterality: N/A;   ESOPHAGOGASTRODUODENOSCOPY (EGD) WITH PROPOFOL N/A 10/20/2015   Procedure: ESOPHAGOGASTRODUODENOSCOPY (EGD) WITH PROPOFOL;  Surgeon: Christena Deem, MD;  Location: [facility] ENDOSCOPY;  Service: Endoscopy;  Laterality: N/A;   ESOPHAGOGASTRODUODENOSCOPY (EGD) WITH PROPOFOL N/A 02/23/2016   Procedure:  ESOPHAGOGASTRODUODENOSCOPY (EGD) WITH PROPOFOL;  Surgeon: Christena Deem, MD;  Location: [facility] ENDOSCOPY;  Service: Endoscopy;  Laterality: N/A;   ESOPHAGOGASTRODUODENOSCOPY (EGD) WITH PROPOFOL N/A 06/30/2016   Procedure: ESOPHAGOGASTRODUODENOSCOPY (EGD) WITH PROPOFOL;  Surgeon: Christena Deem, MD;  Location: [facility] ENDOSCOPY;  Service: Endoscopy;  Laterality: N/A;   ESOPHAGOGASTRODUODENOSCOPY (EGD) WITH PROPOFOL N/A 01/05/2017   Procedure: ESOPHAGOGASTRODUODENOSCOPY (EGD) WITH PROPOFOL;  Surgeon: Christena Deem, MD;  Location: [facility] ENDOSCOPY;  Service: Endoscopy;  Laterality: N/A;   KYPHOPLASTY N/A 02/10/2015   Procedure: [id number];  Surgeon: Kennedy Bucker, MD;  Location: [facility] ORS;  Service: Orthopedics;  Laterality: N/A;   LAPAROTOMY N/A 03/11/2015   Procedure: REDUCTION OF GASTRIC VOLVULUS, SPLEENECTOMY, GASTRIC TUBE PLACEMENT;  Surgeon: Earline Mayotte, MD;  Location: [facility] ORS;  Service: General;  Laterality: N/A;   MOHS SURGERY     PERIPHERAL VASCULAR CATHETERIZATION N/A 06/12/2015   Procedure: IVC Filter Insertion;  Surgeon: Annice Needy, MD;  Location: [facility] INVASIVE CV LAB;  Service: Cardiovascular;  Laterality: N/A;   PERIPHERAL VASCULAR CATHETERIZATION N/A 08/23/2016   Procedure: IVC Filter Removal;  Surgeon: Renford Dills, MD;  Location: [facility] INVASIVE CV LAB;  Service: Cardiovascular;  Laterality: N/A;   STOMACH SURGERY     for volvolus    Social History   Socioeconomic History   Marital status: Divorced    Spouse name: Not on file   Number of children: Not on file   Years of education: Not on file   Highest education level: Not on file  Occupational History   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) Health 30 years  Social Network engineer strain: Not hard at all   Food insecurity:    Worry: Never true    Inability: Never true   Transportation needs:    Medical: No    Non-medical: No  Tobacco Use   Smoking status: Former  Smoker    Last attempt to quit: 05/25/1991    Years since quitting: 27.7   Smokeless tobacco: Never Used  Substance and Sexual Activity   Alcohol use: No    Frequency: Never    Comment: rare   Drug use: No   Sexual activity: Not on file  Lifestyle   Physical activity:    Days per week: 0 days    Minutes per session: 0 min   Stress: Not at all  Relationships   Social connections:    Talks on phone: Patient refused    Gets together: Patient refused    Attends religious service: Patient refused    Active member of club or organization: Patient refused    Attends meetings of clubs or organizations: Patient refused    Relationship status: Divorced   Intimate partner violence:    Fear of  current or ex partner: No    Emotionally abused: No    Physically abused: No    Forced sexual activity: No  Other Topics Concern   Not on file  Social History Narrative   Not on file    Family History  Problem Relation Age of Onset   Colon cancer Mother    Cirrhosis Brother        alcoholic   Alcohol abuse Sister    Breast cancer Sister 48   Cancer Brother    Lung cancer Son    Meniere's disease Son    Cirrhosis Brother        nonalcoholic      Current Outpatient Medications:    ALPRAZolam (XANAX) 0.25 MG tablet, Take 1 tablet (0.25 mg total) by mouth 2 (two) times daily as needed for anxiety., Disp: 20 tablet, Rfl: 5   Ascorbic Acid (VITAMIN C) 1000 MG tablet, Take 1,000 mg by mouth daily., Disp: , Rfl:    budesonide-formoterol (SYMBICORT) 160-4.5 MCG/ACT inhaler, Symbicort 160 mcg-4.5 mcg/actuation HFA aerosol inhaler, Disp: , Rfl:    Calcium Carbonate-Vitamin D 600-400 MG-UNIT tablet, Take 1 tablet by mouth daily., Disp: , Rfl:    cyanocobalamin (,VITAMIN B-12,) 1000 MCG/ML injection, Inject 1 mL (1,000 mcg total) into the muscle every 30 (thirty) days. Pt uses on the 1st of every month., Disp: 10 mL, Rfl: 1   fentaNYL (DURAGESIC) 50 MCG/HR, Place 1 patch onto  the skin every 3 (three) days., Disp: 10 patch, Rfl: 0   hydroxychloroquine (PLAQUENIL) 200 MG tablet, Take 200 mg by mouth daily., Disp: , Rfl:    Lactulose 20 GM/30ML SOLN, 30 ml every 4 hours until constipation is relieved, Disp: 236 mL, Rfl: 3   letrozole (FEMARA) 2.5 MG tablet, TAKE 1 TABLET BY MOUTH EVERY DAY, Disp: 90 tablet, Rfl: 1   lisinopril (PRINIVIL,ZESTRIL) 5 MG tablet, TAKE 1 TABLET (5 MG TOTAL) BY MOUTH DAILY., Disp: 90 tablet, Rfl: 3   metoCLOPramide (REGLAN) 10 MG tablet, TAKE 1 TABLET BY MOUTH 4 TIMES DAILY - BEFORE MEALS AND AT BEDTIME., Disp: 360 tablet, Rfl: 0   metoprolol succinate (TOPROL-XL) 25 MG 24 hr tablet, Take 12.5 mg by mouth daily. , Disp: , Rfl:    mirtazapine (REMERON) 15 MG tablet, TAKE 1/2 TO 1 TABLET BY MOUTH NIGHTLY AT BEDTIME, Disp: 90 tablet, Rfl: 1   mycophenolate (CELLCEPT) 500 MG tablet, Take 1,000 mg by mouth 2 (two) times daily., Disp: , Rfl:    ondansetron (ZOFRAN ODT) 4 MG disintegrating tablet, Take 1 tablet (4 mg total) by mouth every 8 (eight) hours as needed for nausea or vomiting., Disp: 20 tablet, Rfl: 0   oxyCODONE-acetaminophen (PERCOCET/ROXICET) 5-325 MG tablet, Take 1 tablet by mouth every 8 (eight) hours as needed for severe pain., Disp: 65 tablet, Rfl: 0   OXYGEN, Inhale 2 mLs into the lungs at bedtime., Disp: , Rfl:    pantoprazole (PROTONIX) 40 MG tablet, Take 40 mg by mouth 2 (two) times daily., Disp: , Rfl:    Polyvinyl Alcohol-Povidone (REFRESH OP), Place 2 drops into both eyes daily as needed (dry eyes). , Disp: , Rfl:    predniSONE (DELTASONE) 5 MG tablet, Take 5 mg by mouth daily. , Disp: , Rfl:    prochlorperazine (COMPAZINE) 5 MG tablet, Take 1 tablet (5 mg total) by mouth every 6 (six) hours as needed for nausea or vomiting., Disp: 30 tablet, Rfl: 5   Saccharomyces boulardii (PROBIOTIC) 250 MG CAPS, Take 1 tablet  by mouth daily., Disp: 14 capsule, Rfl:    sucralfate (CARAFATE) 1 GM/10ML suspension, Take 1 g by  mouth 4 (four) times daily. Before meals and nightly, Disp: , Rfl:    SYNTHROID 125 MCG tablet, Take 1 tablet (125 mcg total) by mouth daily., Disp: 90 tablet, Rfl: 1   Syringe/Needle, Disp, (SYRINGE 3CC/25GX1") 25G X 1" 3 ML MISC, Use for b12 injections, Disp: 50 each, Rfl: 0   Tadalafil, PAH, (ADCIRCA) 20 MG TABS, Take 40 mg by mouth daily., Disp: , Rfl:    warfarin (COUMADIN) 3 MG tablet, Take 1 tablet (3 mg total) by mouth daily., Disp: 90 tablet, Rfl: 1   warfarin (COUMADIN) 6 MG tablet, Take 1 tablet (6 mg total) by mouth daily. As directed.  PATIENT NEEDS TODAY, Disp: 30 tablet, Rfl: 2 No current facility-administered medications for this visit.   Facility-Administered Medications Ordered in Other Visits:    Darbepoetin Alfa (ARANESP) injection 300 mcg, 300 mcg, Subcutaneous, Weekly, Creig Hines, MD, 300 mcg at 02/04/19 1512   Darbepoetin Alfa (ARANESP) injection 300 mcg, 300 mcg, Subcutaneous, Weekly, Creig Hines, MD  Physical exam:  Vitals:   02/25/19 1434  BP: (!) 94/59  Pulse: 97  Temp: 98.9 F (37.2 C)  TempSrc: Tympanic  Weight: 104 lb 9.6 oz (47.4 kg)   Physical Exam Constitutional:      Comments: Thin elderly frail woman sitting in a wheelchair.  HENT:     Head: Normocephalic and atraumatic.  Eyes:     Pupils: Pupils are equal, round, and reactive to light.  Neck:     Musculoskeletal: Normal range of motion.  Cardiovascular:     Rate and Rhythm: Normal rate and regular rhythm.     Heart sounds: Normal heart sounds.  Pulmonary:     Effort: Pulmonary effort is normal.     Breath sounds: Normal breath sounds.  Abdominal:     General: Bowel sounds are normal.     Palpations: Abdomen is soft.  Musculoskeletal:     Right lower leg: Edema present.     Left lower leg: Edema (Left lower extremity hematoma noted) present.  Skin:    General: Skin is warm and dry.     Comments: Bilateral erythematous macular rash noted over bilateral cheeks  Neurological:      Mental Status: She is alert and oriented to person, place, and time.      CMP Latest Ref Rng & Units 10/09/2018  Glucose 70 - 99 mg/dL 132(G)  BUN 8 - 23 mg/dL 40(N)  Creatinine 0.27 - 1.00 mg/dL 2.53(G)  Sodium 644 - 034 mmol/L 144  Potassium 3.5 - 5.1 mmol/L 4.2  Chloride 98 - 111 mmol/L 112(H)  CO2 22 - 32 mmol/L 24  Calcium 8.9 - 10.3 mg/dL 7.4(Q)  Total Protein 6.5 - 8.1 g/dL 6.4(L)  Total Bilirubin 0.3 - 1.2 mg/dL 5.9(D)  Alkaline Phos 38 - 126 U/L 49  AST 15 - 41 U/L 19  ALT 0 - 44 U/L 7   CBC Latest Ref Rng & Units 02/25/2019  WBC 4.0 - 10.5 K/uL 4.6  Hemoglobin 12.0 - 15.0 g/dL 6.3(O)  Hematocrit 75.6 - 46.0 % 32.1(L)  Platelets 150 - 400 K/uL 334      Assessment and plan- Patient is a 84 y.o. female with following issues:  1.  Anemia of chronic kidney disease.  Hemoglobin improved to 9.2 after Aranesp.  She will continue to get Aranesp every 3 weeks and I will see  him back in 12 weeks with CBC ferritin and iron studies  2.  Left breast cancer: She is currently on letrozole and will complete 5 years in December 2020.  Clinically doing well and no concerning symptoms of recurrence at this time.  Recent mammogram from January 2020 showed no evidence of malignancy   Visit Diagnosis 1. History of anemia due to chronic kidney disease   2. Use of letrozole (Femara)   3. Encounter for follow-up surveillance of breast cancer   4. Erythropoietin (EPO) stimulating agent anemia management patient      Dr. Owens Shark, MD, MPH [facility] [phone] 02/25/2019 2:45 PM

## 2019-02-27 LAB — POCT INR: INR: 2.3 — AB (ref 0.9–1.1)

## 2019-03-01 ENCOUNTER — Telehealth: Payer: Self-pay

## 2019-03-01 NOTE — Telephone Encounter (Signed)
INR from 02/27/2019 is 2.3. Pt is currently taking 3mg  daily.   Abstracted.

## 2019-03-02 ENCOUNTER — Other Ambulatory Visit: Payer: Self-pay | Admitting: Internal Medicine

## 2019-03-02 MED ORDER — OXYCODONE-ACETAMINOPHEN 5-325 MG PO TABS: 1.0000 | ORAL_TABLET | Freq: Two times a day (BID) | ORAL | 0 refills | Status: DC | PRN

## 2019-03-02 NOTE — Telephone Encounter (Signed)
Patient called and advised to Continue current regimen of 3 mg daily . INR is at goal. Repeat in one month

## 2019-03-02 NOTE — Progress Notes (Signed)
Percocet refilled during phone call from Dr Derrel Nip re INR

## 2019-03-06 ENCOUNTER — Other Ambulatory Visit
Admission: RE | Admit: 2019-03-06 | Discharge: 2019-03-06 | Disposition: A | Discharge status: Home / Self Care | Payer: Medicare Other | Source: Ambulatory Visit | Attending: Student | Admitting: Student

## 2019-03-06 DIAGNOSIS — M3214 Glomerular disease in systemic lupus erythematosus: Secondary | ICD-10-CM | POA: Insufficient documentation

## 2019-03-06 LAB — URINALYSIS, ROUTINE W REFLEX MICROSCOPIC
Bilirubin Urine: NEGATIVE
Glucose, UA: NEGATIVE mg/dL
Hgb urine dipstick: NEGATIVE
Ketones, ur: NEGATIVE mg/dL
Leukocytes,Ua: NEGATIVE
Nitrite: NEGATIVE
Protein, ur: NEGATIVE mg/dL
Specific Gravity, Urine: 1.013 (ref 1.005–1.030)
pH: 5 (ref 5.0–8.0)

## 2019-03-06 LAB — COMPREHENSIVE METABOLIC PANEL
ALT: 10 U/L (ref 0–44)
AST: 22 U/L (ref 15–41)
Albumin: 2.7 g/dL — ABNORMAL LOW (ref 3.5–5.0)
Alkaline Phosphatase: 111 U/L (ref 38–126)
Anion gap: 10 (ref 5–15)
BUN: 43 mg/dL — ABNORMAL HIGH (ref 8–23)
CO2: 19 mmol/L — ABNORMAL LOW (ref 22–32)
Calcium: 8.4 mg/dL — ABNORMAL LOW (ref 8.9–10.3)
Chloride: 116 mmol/L — ABNORMAL HIGH (ref 98–111)
Creatinine, Ser: 1.93 mg/dL — ABNORMAL HIGH (ref 0.44–1.00)
GFR calc Af Amer: 27 mL/min — ABNORMAL LOW (ref >60)
GFR calc non Af Amer: 24 mL/min — ABNORMAL LOW (ref >60)
Glucose, Bld: 99 mg/dL (ref 70–99)
Potassium: 4.9 mmol/L (ref 3.5–5.1)
Sodium: 145 mmol/L (ref 135–145)
Total Bilirubin: 0.2 mg/dL — ABNORMAL LOW (ref 0.3–1.2)
Total Protein: 5.9 g/dL — ABNORMAL LOW (ref 6.5–8.1)

## 2019-03-06 LAB — PROTEIN / CREATININE RATIO, URINE
Creatinine, Urine: 86 mg/dL
Protein Creatinine Ratio: 0.29 mg/mg{Cre} — ABNORMAL HIGH (ref 0.00–0.15)
Total Protein, Urine: 25 mg/dL

## 2019-03-06 LAB — POCT INR: INR: 1.8 — AB (ref 0.9–1.1)

## 2019-03-07 ENCOUNTER — Telehealth: Payer: Self-pay

## 2019-03-07 ENCOUNTER — Ambulatory Visit (INDEPENDENT_AMBULATORY_CARE_PROVIDER_SITE_OTHER): Payer: Medicare Other | Admitting: Family Medicine

## 2019-03-07 ENCOUNTER — Other Ambulatory Visit: Payer: Self-pay

## 2019-03-07 DIAGNOSIS — L03115 Cellulitis of right lower limb: Secondary | ICD-10-CM

## 2019-03-07 LAB — C3 COMPLEMENT: C3 Complement: 92 mg/dL (ref 82–167)

## 2019-03-07 LAB — C4 COMPLEMENT: Complement C4, Body Fluid: 23 mg/dL (ref 14–44)

## 2019-03-07 MED ORDER — MUPIROCIN 2 % EX OINT: 1.0000 "application " | TOPICAL_OINTMENT | Freq: Two times a day (BID) | CUTANEOUS | 0 refills | Status: DC

## 2019-03-07 MED ORDER — DOXYCYCLINE HYCLATE 100 MG PO TABS: 100.0000 mg | ORAL_TABLET | Freq: Two times a day (BID) | ORAL | 0 refills | Status: DC

## 2019-03-07 NOTE — Telephone Encounter (Signed)
She should suspend her coumadin dose on Saturday  And Monday and Wednesday continue to take 3 mg today , Friday and Sunday and Tuesday  And Thursday of next week.  She can check her INR next  Wednesday

## 2019-03-07 NOTE — Telephone Encounter (Signed)
Spoke with pt and she stated that she is taking 3mg  daily.   Pt also wanted to let you know that she had a virtual Visit with Philis Nettle, NP today and she prescribed her an antibiotic for cellulitis. So the pt will be starting an antibiotic today.

## 2019-03-07 NOTE — Telephone Encounter (Signed)
Spoke with pt and informed her of her new coumadin regimen. Pt repeated with understanding.

## 2019-03-07 NOTE — Telephone Encounter (Signed)
Copied from [place] [id number]. Topic: General - Other >> Mar 07, 2019 10:33 AM Virl Axe D wrote: Reason for CRM: MDINR reported pt's INR of 1.8 taken on 03/08/19. Please advise

## 2019-03-07 NOTE — Progress Notes (Signed)
Patient ID: Diana [name], female   DOB: January 01, 1935, 84 y.o.   MRN: [id number]    Virtual Visit via video Note  This visit type was conducted due to national recommendations for restrictions regarding the COVID-19 pandemic (e.g. social distancing).  This format is felt to be most appropriate for this patient at this time.  All issues noted in this document were discussed and addressed.  No physical exam was performed (except for noted visual exam findings with Video Visits).   I connected with [name] today at 10:20 AM EDT by a video enabled telemedicine application and verified that I am speaking with the correct person using two identifiers. Location patient: home Location provider: work or home office Persons participating in the virtual visit: patient, provider -- family member helped her set up video call.   I discussed the limitations, risks, security and privacy concerns of performing an evaluation and management service by video and the availability of in person appointments. I also discussed with the patient that there may be a patient responsible charge related to this service. The patient expressed understanding and agreed to proceed.   HPI: Patient and I connected via video due to swelling and redness of right lower extremity.  Patient states she hit her leg on the corner of a table about 2 weeks ago, and noticed a small blister.  States a couple of days ago the blister burst and now the surrounding skin has become red and inflamed.  Also has noticed a small amount of white drainage from the area and became concerned for skin infection.  Denies fever or chills.  Denies chest pain, shortness of breath or wheezing.  Denies body aches.  States skin of right lower extremity is warmer than left lower extremity to touch.  Tetanus vaccine is up-to-date.  ROS: See pertinent positives and negatives per HPI.  Past Medical History:  Diagnosis Date  . Acute glomerulonephritis with  other specified pathological lesion in kidney in disease classified elsewhere(580.81)   . Alveolar aeration decreased   . Anemia   . Arthritis   . Atrial fibrillation (HCC)   . Breast cancer (HCC) 06/03/2014   positive, radiation  . Cancer (HCC)    Breast  . CHF (congestive heart failure) (HCC)   . Diaphragmatic hernia   . DVT (deep venous thrombosis) (HCC)   . Dysrhythmia   . Environmental allergies   . Erosive esophagitis 03/28/2015   esophageal biopsy July 2017 c/w CMV cytopathic changes, negative for H Pylori and dysplasia     . Esophageal reflux   . Esophagitis, CMV (HCC) 05/26/2015   Diagnosed at [facility] with manometry,  Tube feeds started for enteral nutrtion 05/12/15   . Feeding problem    FEEDING TUBE  . Hiatal hernia   . Hyperlipidemia   . Hypertension   . Lower extremity edema   . Lupus (systemic lupus erythematosus) (HCC)   . Neuropathy   . Osteopenia   . Oxygen deficiency    USES HS  . Peripheral neuropathy, hereditary/idiopathic   . Personal history of radiation therapy   . Pneumonia   . Primary pulmonary hypertension (HCC)   . Pulmonary hypertension (HCC)   . Renal insufficiency   . Sciatica   . Shingles   . Shingles (herpes zoster) polyneuropathy March 2013   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  . Shortness of breath dyspnea   . Unspecified hypothyroidism   . Unspecified menopausal and postmenopausal disorder   . Urinary incontinence without sensory  awareness   . Vitamin B deficiency     Past Surgical History:  Procedure Laterality Date  . BREAST BIOPSY Left 2015   invasive mammary carcinoma  . BREAST LUMPECTOMY Left 2015   invasive mammary carcinoma clear margins but close  . BREAST SURGERY    . CATARACT EXTRACTION W/PHACO Right 04/05/2016   Procedure: CATARACT EXTRACTION PHACO AND INTRAOCULAR LENS PLACEMENT (IOC);  Surgeon: Galen Manila, MD;  Location: [facility] ORS;  Service: Ophthalmology;  Laterality: Right;  [place] 01:20 AP% 23.9 CDE 19.29 fluid pack lot  # [id number]  . CATARACT EXTRACTION W/PHACO Left 04/26/2016   Procedure: CATARACT EXTRACTION PHACO AND INTRAOCULAR LENS PLACEMENT (IOC);  Surgeon: Galen Manila, MD;  Location: [facility] ORS;  Service: Ophthalmology;  Laterality: Left;   [place] 00:59 AP% 23.2 CDE 13.83 Fluid pack lot # [id number]  . COLONOSCOPY WITH PROPOFOL N/A 06/04/2018   Procedure: COLONOSCOPY WITH PROPOFOL;  Surgeon: Christena Deem, MD;  Location: [facility] ENDOSCOPY;  Service: Endoscopy;  Laterality: N/A;  . DIAGNOSTIC LAPAROSCOPY    . ESOPHAGOGASTRODUODENOSCOPY N/A 07/19/2015   Procedure: ESOPHAGOGASTRODUODENOSCOPY (EGD);  Surgeon: Wallace Cullens, MD;  Location: [facility] ENDOSCOPY;  Service: Gastroenterology;  Laterality: N/A;  . ESOPHAGOGASTRODUODENOSCOPY (EGD) WITH PROPOFOL N/A 03/28/2015   Procedure: ESOPHAGOGASTRODUODENOSCOPY (EGD) WITH PROPOFOL;  Surgeon: Earline Mayotte, MD;  Location: [facility] ENDOSCOPY;  Service: Endoscopy;  Laterality: N/A;  . ESOPHAGOGASTRODUODENOSCOPY (EGD) WITH PROPOFOL N/A 06/10/2015   Procedure: ESOPHAGOGASTRODUODENOSCOPY (EGD) WITH PROPOFOL;  Surgeon: Christena Deem, MD;  Location[facility] ENDOSCOPY;  Service: Endoscopy;  Laterality: N/A;  . ESOPHAGOGASTRODUODENOSCOPY (EGD) WITH PROPOFOL N/A 10/20/2015   Procedure: ESOPHAGOGASTRODUODENOSCOPY (EGD) WITH PROPOFOL;  Surgeon: Christena Deem, MD;  Location: [facility] ENDOSCOPY;  Service: Endoscopy;  Laterality: N/A;  . ESOPHAGOGASTRODUODENOSCOPY (EGD) WITH PROPOFOL N/A 02/23/2016   Procedure: ESOPHAGOGASTRODUODENOSCOPY (EGD) WITH PROPOFOL;  Surgeon: Christena Deem, MD;  Location: [facility] ENDOSCOPY;  Service: Endoscopy;  Laterality: N/A;  . ESOPHAGOGASTRODUODENOSCOPY (EGD) WITH PROPOFOL N/A 06/30/2016   Procedure: ESOPHAGOGASTRODUODENOSCOPY (EGD) WITH PROPOFOL;  Surgeon: Christena Deem, MD;  Location: [facility] ENDOSCOPY;  Service: Endoscopy;  Laterality: N/A;  . ESOPHAGOGASTRODUODENOSCOPY (EGD) WITH PROPOFOL N/A 01/05/2017   Procedure: ESOPHAGOGASTRODUODENOSCOPY (EGD) WITH  PROPOFOL;  Surgeon: Christena Deem, MD;  Location: [facility] ENDOSCOPY;  Service: Endoscopy;  Laterality: N/A;  . KYPHOPLASTY N/A 02/10/2015   Procedure: [id number];  Surgeon: Kennedy Bucker, MD;  Location: [facility] ORS;  Service: Orthopedics;  Laterality: N/A;  . LAPAROTOMY N/A 03/11/2015   Procedure: REDUCTION OF GASTRIC VOLVULUS, SPLEENECTOMY, GASTRIC TUBE PLACEMENT;  Surgeon: Earline Mayotte, MD;  Location: [facility] ORS;  Service: General;  Laterality: N/A;  . MOHS SURGERY    . PERIPHERAL VASCULAR CATHETERIZATION N/A 06/12/2015   Procedure: IVC Filter Insertion;  Surgeon: Annice Needy, MD;  Location: [facility] INVASIVE CV LAB;  Service: Cardiovascular;  Laterality: N/A;  . PERIPHERAL VASCULAR CATHETERIZATION N/A 08/23/2016   Procedure: IVC Filter Removal;  Surgeon: Renford Dills, MD;  Location: [facility] INVASIVE CV LAB;  Service: Cardiovascular;  Laterality: N/A;  . STOMACH SURGERY     for volvolus    Family History  Problem Relation Age of Onset  . Colon cancer Mother   . Cirrhosis Brother        alcoholic  . Alcohol abuse Sister   . Breast cancer Sister 9  . Cancer Brother   . Lung cancer Son   . Meniere's disease Son   . Cirrhosis Brother        nonalcoholic    Social History  Tobacco Use  . Smoking status: Former Smoker    Quit date: 05/25/1991    Years since quitting: 27.8  . Smokeless tobacco: Never Used  Substance Use Topics  . Alcohol use: No    Frequency: Never    Comment: rare    Current Outpatient Medications:  .  ALPRAZolam (XANAX) 0.25 MG tablet, Take 1 tablet (0.25 mg total) by mouth 2 (two) times daily as needed for anxiety., Disp: 20 tablet, Rfl: 5 .  Ascorbic Acid (VITAMIN C) 1000 MG tablet, Take 1,000 mg by mouth daily., Disp: , Rfl:  .  budesonide-formoterol (SYMBICORT) 160-4.5 MCG/ACT inhaler, Symbicort 160 mcg-4.5 mcg/actuation HFA aerosol inhaler, Disp: , Rfl:  .  Calcium Carbonate-Vitamin D 600-400 MG-UNIT tablet, Take 1 tablet by mouth daily., Disp: , Rfl:  .   cyanocobalamin (,VITAMIN B-12,) 1000 MCG/ML injection, Inject 1 mL (1,000 mcg total) into the muscle every 30 (thirty) days. Pt uses on the 1st of every month., Disp: 10 mL, Rfl: 1 .  fentaNYL (DURAGESIC) 50 MCG/HR, Place 1 patch onto the skin every 3 (three) days., Disp: 10 patch, Rfl: 0 .  hydroxychloroquine (PLAQUENIL) 200 MG tablet, Take 200 mg by mouth daily., Disp: , Rfl:  .  Lactulose 20 GM/30ML SOLN, 30 ml every 4 hours until constipation is relieved, Disp: 236 mL, Rfl: 3 .  letrozole (FEMARA) 2.5 MG tablet, TAKE 1 TABLET BY MOUTH EVERY DAY, Disp: 90 tablet, Rfl: 1 .  lisinopril (PRINIVIL,ZESTRIL) 5 MG tablet, TAKE 1 TABLET (5 MG TOTAL) BY MOUTH DAILY., Disp: 90 tablet, Rfl: 3 .  metoCLOPramide (REGLAN) 10 MG tablet, TAKE 1 TABLET BY MOUTH 4 TIMES DAILY - BEFORE MEALS AND AT BEDTIME., Disp: 360 tablet, Rfl: 0 .  metoprolol succinate (TOPROL-XL) 25 MG 24 hr tablet, Take 12.5 mg by mouth daily. , Disp: , Rfl:  .  mirtazapine (REMERON) 15 MG tablet, TAKE 1/2 TO 1 TABLET BY MOUTH NIGHTLY AT BEDTIME, Disp: 90 tablet, Rfl: 1 .  mycophenolate (CELLCEPT) 500 MG tablet, Take 1,000 mg by mouth 2 (two) times daily., Disp: , Rfl:  .  ondansetron (ZOFRAN ODT) 4 MG disintegrating tablet, Take 1 tablet (4 mg total) by mouth every 8 (eight) hours as needed for nausea or vomiting., Disp: 20 tablet, Rfl: 0 .  oxyCODONE-acetaminophen (PERCOCET/ROXICET) 5-325 MG tablet, Take 1 tablet by mouth 2 (two) times daily as needed for severe pain., Disp: 60 tablet, Rfl: 0 .  OXYGEN, Inhale 2 mLs into the lungs at bedtime., Disp: , Rfl:  .  pantoprazole (PROTONIX) 40 MG tablet, Take 40 mg by mouth 2 (two) times daily., Disp: , Rfl:  .  Polyvinyl Alcohol-Povidone (REFRESH OP), Place 2 drops into both eyes daily as needed (dry eyes). , Disp: , Rfl:  .  predniSONE (DELTASONE) 5 MG tablet, Take 5 mg by mouth daily. , Disp: , Rfl:  .  prochlorperazine (COMPAZINE) 5 MG tablet, Take 1 tablet (5 mg total) by mouth every 6 (six)  hours as needed for nausea or vomiting., Disp: 30 tablet, Rfl: 5 .  Saccharomyces boulardii (PROBIOTIC) 250 MG CAPS, Take 1 tablet by mouth daily., Disp: 14 capsule, Rfl:  .  sucralfate (CARAFATE) 1 GM/10ML suspension, Take 1 g by mouth 4 (four) times daily. Before meals and nightly, Disp: , Rfl:  .  SYNTHROID 125 MCG tablet, Take 1 tablet (125 mcg total) by mouth daily., Disp: 90 tablet, Rfl: 1 .  Syringe/Needle, Disp, (SYRINGE 3CC/25GX1") 25G X 1" 3 ML MISC, Use for b12  injections, Disp: 50 each, Rfl: 0 .  Tadalafil, PAH, (ADCIRCA) 20 MG TABS, Take 40 mg by mouth daily., Disp: , Rfl:  .  warfarin (COUMADIN) 3 MG tablet, Take 1 tablet (3 mg total) by mouth daily., Disp: 90 tablet, Rfl: 1 .  warfarin (COUMADIN) 6 MG tablet, Take 1 tablet (6 mg total) by mouth daily. As directed.  PATIENT NEEDS TODAY, Disp: 30 tablet, Rfl: 2 .  doxycycline (VIBRA-TABS) 100 MG tablet, Take 1 tablet (100 mg total) by mouth 2 (two) times daily., Disp: 20 tablet, Rfl: 0 .  mupirocin ointment (BACTROBAN) 2 %, Apply 1 application topically 2 (two) times daily. Apply thin layer to affected area on leg, Disp: 22 g, Rfl: 0 No current facility-administered medications for this visit.   Facility-Administered Medications Ordered in Other Visits:  .  Darbepoetin Alfa (ARANESP) injection 300 mcg, 300 mcg, Subcutaneous, Weekly, Creig Hines, MD, 300 mcg at 02/04/19 1512 .  Darbepoetin Alfa (ARANESP) injection 300 mcg, 300 mcg, Subcutaneous, Weekly, Creig Hines, MD, 300 mcg at 02/25/19 1455  EXAM:  GENERAL: alert, oriented, appears well and in no acute distress  HEENT: atraumatic, conjunttiva clear, no obvious abnormalities on inspection of external nose and ears  NECK: normal movements of the head and neck  LUNGS: on inspection no signs of respiratory distress, breathing rate appears normal, no obvious gross SOB, gasping or wheezing  CV: no obvious cyanosis  SKIN: Patient able to show me her leg by angling the  camera down.  I can see redness of right lower extremity with an open area that most likely was a blister that burst per patient report.  Open area of skin is approximately the size of a quarter, does not appear to be deep or any obvious tunneling from what I can tell over the video.   MS: moves all visible extremities without noticeable abnormality  PSYCH/NEURO: pleasant and cooperative, no obvious depression or anxiety, speech and thought processing grossly intact  ASSESSMENT AND PLAN:  Discussed the following assessment and plan:  Cellulitis right lower extremity - we will treat patient cellulitis with doxycycline twice daily for 10 days and she will also use mupirocin topical ointment on skin twice a day after washing skin with soap and water and patting dry.  Advised to monitor for any worsening redness, increasing pain or any worsening swelling of right lower extremity let us know right away.  Patient has upcoming INR check planned for next week with oncology.  Patient is aware that taking antibiotics can alter the INR reading.    I discussed the assessment and treatment plan with the patient. The patient was provided an opportunity to ask questions and all were answered. The patient agreed with the plan and demonstrated an understanding of the instructions.   The patient was advised to call back or seek an in-person evaluation if the symptoms worsen or if the condition fails to improve as anticipated.  Tracey Harries, FNP

## 2019-03-08 LAB — ANTI-DNA ANTIBODY, DOUBLE-STRANDED: ds DNA Ab: 1 IU/mL (ref 0–9)

## 2019-03-08 MED ORDER — METOPROLOL SUCCINATE ER 25 MG TABLET,EXTENDED RELEASE 24 HR
ORAL_TABLET | 9 refills | 0 days | Status: CP
Start: 2019-03-08 — End: ?

## 2019-03-10 ENCOUNTER — Other Ambulatory Visit: Payer: Self-pay

## 2019-03-10 ENCOUNTER — Inpatient Hospital Stay
Admission: EM | Admit: 2019-03-10 | Discharge: 2019-03-13 | DRG: 392 | Disposition: A | Discharge status: Home / Self Care | Payer: Medicare Other | Attending: Internal Medicine | Admitting: Internal Medicine

## 2019-03-10 ENCOUNTER — Encounter: Payer: Self-pay | Admitting: *Deleted

## 2019-03-10 ENCOUNTER — Emergency Department: Payer: Medicare Other

## 2019-03-10 DIAGNOSIS — E039 Hypothyroidism, unspecified: Secondary | ICD-10-CM | POA: Diagnosis present

## 2019-03-10 DIAGNOSIS — K449 Diaphragmatic hernia without obstruction or gangrene: Secondary | ICD-10-CM | POA: Diagnosis present

## 2019-03-10 DIAGNOSIS — Z86718 Personal history of other venous thrombosis and embolism: Secondary | ICD-10-CM | POA: Diagnosis not present

## 2019-03-10 DIAGNOSIS — R531 Weakness: Secondary | ICD-10-CM

## 2019-03-10 DIAGNOSIS — K5730 Diverticulosis of large intestine without perforation or abscess without bleeding: Secondary | ICD-10-CM | POA: Diagnosis not present

## 2019-03-10 DIAGNOSIS — Z853 Personal history of malignant neoplasm of breast: Secondary | ICD-10-CM | POA: Diagnosis not present

## 2019-03-10 DIAGNOSIS — D539 Nutritional anemia, unspecified: Secondary | ICD-10-CM | POA: Diagnosis present

## 2019-03-10 DIAGNOSIS — D631 Anemia in chronic kidney disease: Secondary | ICD-10-CM | POA: Diagnosis present

## 2019-03-10 DIAGNOSIS — R079 Chest pain, unspecified: Secondary | ICD-10-CM | POA: Diagnosis not present

## 2019-03-10 DIAGNOSIS — I130 Hypertensive heart and chronic kidney disease with heart failure and stage 1 through stage 4 chronic kidney disease, or unspecified chronic kidney disease: Secondary | ICD-10-CM | POA: Diagnosis present

## 2019-03-10 DIAGNOSIS — K5792 Diverticulitis of intestine, part unspecified, without perforation or abscess without bleeding: Secondary | ICD-10-CM | POA: Diagnosis not present

## 2019-03-10 DIAGNOSIS — Z888 Allergy status to other drugs, medicaments and biological substances status: Secondary | ICD-10-CM | POA: Diagnosis not present

## 2019-03-10 DIAGNOSIS — T18128A Food in esophagus causing other injury, initial encounter: Secondary | ICD-10-CM

## 2019-03-10 DIAGNOSIS — R600 Localized edema: Secondary | ICD-10-CM

## 2019-03-10 DIAGNOSIS — R4702 Dysphasia: Secondary | ICD-10-CM | POA: Diagnosis not present

## 2019-03-10 DIAGNOSIS — K219 Gastro-esophageal reflux disease without esophagitis: Secondary | ICD-10-CM | POA: Diagnosis not present

## 2019-03-10 DIAGNOSIS — N183 Chronic kidney disease, stage 3 (moderate): Secondary | ICD-10-CM | POA: Diagnosis present

## 2019-03-10 DIAGNOSIS — Z882 Allergy status to sulfonamides status: Secondary | ICD-10-CM | POA: Diagnosis not present

## 2019-03-10 DIAGNOSIS — K5732 Diverticulitis of large intestine without perforation or abscess without bleeding: Secondary | ICD-10-CM | POA: Diagnosis not present

## 2019-03-10 DIAGNOSIS — R7989 Other specified abnormal findings of blood chemistry: Secondary | ICD-10-CM | POA: Diagnosis not present

## 2019-03-10 DIAGNOSIS — Z800 Family history of malignant neoplasm of digestive organs: Secondary | ICD-10-CM

## 2019-03-10 DIAGNOSIS — E1142 Type 2 diabetes mellitus with diabetic polyneuropathy: Secondary | ICD-10-CM | POA: Diagnosis not present

## 2019-03-10 DIAGNOSIS — I509 Heart failure, unspecified: Secondary | ICD-10-CM | POA: Diagnosis present

## 2019-03-10 DIAGNOSIS — Z7951 Long term (current) use of inhaled steroids: Secondary | ICD-10-CM

## 2019-03-10 DIAGNOSIS — R112 Nausea with vomiting, unspecified: Secondary | ICD-10-CM

## 2019-03-10 DIAGNOSIS — Z79818 Long term (current) use of other agents affecting estrogen receptors and estrogen levels: Secondary | ICD-10-CM

## 2019-03-10 DIAGNOSIS — E860 Dehydration: Secondary | ICD-10-CM | POA: Diagnosis present

## 2019-03-10 DIAGNOSIS — Z7952 Long term (current) use of systemic steroids: Secondary | ICD-10-CM

## 2019-03-10 DIAGNOSIS — K222 Esophageal obstruction: Secondary | ICD-10-CM | POA: Diagnosis present

## 2019-03-10 DIAGNOSIS — Z9081 Acquired absence of spleen: Secondary | ICD-10-CM

## 2019-03-10 DIAGNOSIS — Z9981 Dependence on supplemental oxygen: Secondary | ICD-10-CM

## 2019-03-10 DIAGNOSIS — Z20828 Contact with and (suspected) exposure to other viral communicable diseases: Secondary | ICD-10-CM | POA: Diagnosis not present

## 2019-03-10 DIAGNOSIS — Z91041 Radiographic dye allergy status: Secondary | ICD-10-CM

## 2019-03-10 DIAGNOSIS — I248 Other forms of acute ischemic heart disease: Secondary | ICD-10-CM | POA: Diagnosis present

## 2019-03-10 DIAGNOSIS — I110 Hypertensive heart disease with heart failure: Secondary | ICD-10-CM | POA: Diagnosis not present

## 2019-03-10 DIAGNOSIS — M329 Systemic lupus erythematosus, unspecified: Secondary | ICD-10-CM | POA: Diagnosis present

## 2019-03-10 DIAGNOSIS — Z1159 Encounter for screening for other viral diseases: Secondary | ICD-10-CM | POA: Diagnosis not present

## 2019-03-10 DIAGNOSIS — L03116 Cellulitis of left lower limb: Secondary | ICD-10-CM | POA: Diagnosis present

## 2019-03-10 DIAGNOSIS — Z7901 Long term (current) use of anticoagulants: Secondary | ICD-10-CM

## 2019-03-10 DIAGNOSIS — E539 Vitamin B deficiency, unspecified: Secondary | ICD-10-CM | POA: Diagnosis present

## 2019-03-10 DIAGNOSIS — R1310 Dysphagia, unspecified: Secondary | ICD-10-CM | POA: Diagnosis not present

## 2019-03-10 DIAGNOSIS — Z87891 Personal history of nicotine dependence: Secondary | ICD-10-CM

## 2019-03-10 DIAGNOSIS — T189XXA Foreign body of alimentary tract, part unspecified, initial encounter: Secondary | ICD-10-CM | POA: Diagnosis not present

## 2019-03-10 DIAGNOSIS — M8580 Other specified disorders of bone density and structure, unspecified site: Secondary | ICD-10-CM | POA: Diagnosis present

## 2019-03-10 DIAGNOSIS — Z923 Personal history of irradiation: Secondary | ICD-10-CM | POA: Diagnosis not present

## 2019-03-10 DIAGNOSIS — Z79891 Long term (current) use of opiate analgesic: Secondary | ICD-10-CM

## 2019-03-10 DIAGNOSIS — I5030 Unspecified diastolic (congestive) heart failure: Secondary | ICD-10-CM | POA: Diagnosis not present

## 2019-03-10 LAB — CBC WITH DIFFERENTIAL/PLATELET
Abs Immature Granulocytes: 0.02 10*3/uL (ref 0.00–0.07)
Basophils Absolute: 0.1 10*3/uL (ref 0.0–0.1)
Basophils Relative: 2 %
Eosinophils Absolute: 0.1 10*3/uL (ref 0.0–0.5)
Eosinophils Relative: 2 %
HCT: 37.3 % (ref 36.0–46.0)
Hemoglobin: 10.5 g/dL — ABNORMAL LOW (ref 12.0–15.0)
Immature Granulocytes: 0 %
Lymphocytes Relative: 19 %
Lymphs Abs: 1.1 10*3/uL (ref 0.7–4.0)
MCH: 31 pg (ref 26.0–34.0)
MCHC: 28.2 g/dL — ABNORMAL LOW (ref 30.0–36.0)
MCV: 110 fL — ABNORMAL HIGH (ref 80.0–100.0)
Monocytes Absolute: 0.4 10*3/uL (ref 0.1–1.0)
Monocytes Relative: 7 %
Neutro Abs: 4.1 10*3/uL (ref 1.7–7.7)
Neutrophils Relative %: 70 %
Platelets: 275 10*3/uL (ref 150–400)
RBC: 3.39 MIL/uL — ABNORMAL LOW (ref 3.87–5.11)
RDW: 16.9 % — ABNORMAL HIGH (ref 11.5–15.5)
WBC: 5.8 10*3/uL (ref 4.0–10.5)
nRBC: 1.9 % — ABNORMAL HIGH (ref 0.0–0.2)

## 2019-03-10 LAB — COMPREHENSIVE METABOLIC PANEL
ALT: 13 U/L (ref 0–44)
AST: 26 U/L (ref 15–41)
Albumin: 2.9 g/dL — ABNORMAL LOW (ref 3.5–5.0)
Alkaline Phosphatase: 129 U/L — ABNORMAL HIGH (ref 38–126)
Anion gap: 15 (ref 5–15)
BUN: 39 mg/dL — ABNORMAL HIGH (ref 8–23)
CO2: 15 mmol/L — ABNORMAL LOW (ref 22–32)
Calcium: 9 mg/dL (ref 8.9–10.3)
Chloride: 119 mmol/L — ABNORMAL HIGH (ref 98–111)
Creatinine, Ser: 1.8 mg/dL — ABNORMAL HIGH (ref 0.44–1.00)
GFR calc Af Amer: 30 mL/min — ABNORMAL LOW (ref >60)
GFR calc non Af Amer: 26 mL/min — ABNORMAL LOW (ref >60)
Glucose, Bld: 69 mg/dL — ABNORMAL LOW (ref 70–99)
Potassium: 4.3 mmol/L (ref 3.5–5.1)
Sodium: 149 mmol/L — ABNORMAL HIGH (ref 135–145)
Total Bilirubin: 0.5 mg/dL (ref 0.3–1.2)
Total Protein: 6.6 g/dL (ref 6.5–8.1)

## 2019-03-10 LAB — PROTIME-INR
INR: 2.3 — ABNORMAL HIGH (ref 0.8–1.2)
Prothrombin Time: 25.1 seconds — ABNORMAL HIGH (ref 11.4–15.2)

## 2019-03-10 LAB — SARS CORONAVIRUS 2 BY RT PCR (HOSPITAL ORDER, PERFORMED IN ~~LOC~~ HOSPITAL LAB): SARS Coronavirus 2: NEGATIVE

## 2019-03-10 LAB — TROPONIN I
Troponin I: 0.06 ng/mL (ref <0.03)
Troponin I: 0.08 ng/mL (ref <0.03)

## 2019-03-10 LAB — LIPASE, BLOOD: Lipase: 19 U/L (ref 11–51)

## 2019-03-10 LAB — TSH: TSH: 0.031 u[IU]/mL — ABNORMAL LOW (ref 0.350–4.500)

## 2019-03-10 LAB — LACTIC ACID, PLASMA: Lactic Acid, Venous: 1.2 mmol/L (ref 0.5–1.9)

## 2019-03-10 MED ORDER — MYCOPHENOLATE MOFETIL 250 MG PO CAPS
1000.0000 mg | ORAL_CAPSULE | Freq: Two times a day (BID) | ORAL | Status: DC
  Administered 2019-03-12 – 2019-03-13 (×3): 1000 mg via ORAL
  Filled 2019-03-10 (×7): qty 4

## 2019-03-10 MED ORDER — METRONIDAZOLE IN NACL 5-0.79 MG/ML-% IV SOLN
500.0000 mg | Freq: Four times a day (QID) | INTRAVENOUS | Status: DC
  Administered 2019-03-11 (×2): 500 mg via INTRAVENOUS
  Filled 2019-03-10 (×5): qty 100

## 2019-03-10 MED ORDER — ONDANSETRON HCL 4 MG/2ML IJ SOLN
4.0000 mg | Freq: Once | INTRAMUSCULAR | Status: AC
  Administered 2019-03-10: 4 mg via INTRAVENOUS
  Filled 2019-03-10: qty 2

## 2019-03-10 MED ORDER — WARFARIN SODIUM 3 MG PO TABS
3.0000 mg | ORAL_TABLET | ORAL | Status: DC
  Filled 2019-03-10 (×2): qty 1

## 2019-03-10 MED ORDER — SODIUM CHLORIDE 0.9% FLUSH
3.0000 mL | Freq: Once | INTRAVENOUS | Status: AC
  Administered 2019-03-10: 3 mL via INTRAVENOUS

## 2019-03-10 MED ORDER — ACETAMINOPHEN 325 MG PO TABS: 650.0000 mg | ORAL_TABLET | Freq: Four times a day (QID) | ORAL | Status: DC | PRN

## 2019-03-10 MED ORDER — SODIUM CHLORIDE 0.9 % IV BOLUS
500.0000 mL | Freq: Once | INTRAVENOUS | Status: AC
  Administered 2019-03-10: 500 mL via INTRAVENOUS

## 2019-03-10 MED ORDER — ONDANSETRON HCL 4 MG PO TABS
4.0000 mg | ORAL_TABLET | Freq: Four times a day (QID) | ORAL | Status: DC | PRN
  Administered 2019-03-13: 4 mg via ORAL
  Filled 2019-03-10: qty 1

## 2019-03-10 MED ORDER — LETROZOLE 2.5 MG PO TABS
2.5000 mg | ORAL_TABLET | Freq: Every day | ORAL | Status: DC
  Administered 2019-03-12 – 2019-03-13 (×2): 2.5 mg via ORAL
  Filled 2019-03-10 (×3): qty 1

## 2019-03-10 MED ORDER — CIPROFLOXACIN IN D5W 400 MG/200ML IV SOLN
400.0000 mg | Freq: Once | INTRAVENOUS | Status: AC
  Administered 2019-03-10: 400 mg via INTRAVENOUS
  Filled 2019-03-10: qty 200

## 2019-03-10 MED ORDER — ACETAMINOPHEN 650 MG RE SUPP: 650.0000 mg | Freq: Four times a day (QID) | RECTAL | Status: DC | PRN

## 2019-03-10 MED ORDER — FENTANYL 50 MCG/HR TD PT72
1.0000 | MEDICATED_PATCH | TRANSDERMAL | Status: DC
  Administered 2019-03-13: 1 via TRANSDERMAL
  Filled 2019-03-10 (×2): qty 1

## 2019-03-10 MED ORDER — MIRTAZAPINE 15 MG PO TABS: 15.0000 mg | ORAL_TABLET | Freq: Every day | ORAL | Status: DC

## 2019-03-10 MED ORDER — METOPROLOL SUCCINATE ER 25 MG PO TB24
12.5000 mg | ORAL_TABLET | Freq: Every day | ORAL | Status: DC
  Administered 2019-03-11 – 2019-03-13 (×3): 12.5 mg via ORAL
  Filled 2019-03-10 (×3): qty 1

## 2019-03-10 MED ORDER — TADALAFIL (PAH) 20 MG PO TABS
40.0000 mg | ORAL_TABLET | Freq: Every day | ORAL | Status: DC
  Administered 2019-03-11 – 2019-03-13 (×3): 40 mg via ORAL
  Filled 2019-03-10 (×3): qty 2

## 2019-03-10 MED ORDER — WARFARIN SODIUM 6 MG PO TABS: 6.0000 mg | ORAL_TABLET | Freq: Every day | ORAL | Status: DC

## 2019-03-10 MED ORDER — CIPROFLOXACIN IN D5W 400 MG/200ML IV SOLN
400.0000 mg | Freq: Two times a day (BID) | INTRAVENOUS | Status: DC
  Filled 2019-03-10: qty 200

## 2019-03-10 MED ORDER — SUCRALFATE 1 GM/10ML PO SUSP
1.0000 g | Freq: Four times a day (QID) | ORAL | Status: DC
  Administered 2019-03-11 – 2019-03-13 (×5): 1 g via ORAL
  Filled 2019-03-10 (×8): qty 10

## 2019-03-10 MED ORDER — METRONIDAZOLE IN NACL 5-0.79 MG/ML-% IV SOLN
500.0000 mg | Freq: Once | INTRAVENOUS | Status: AC
  Administered 2019-03-10: 20:00:00 500 mg via INTRAVENOUS
  Filled 2019-03-10: qty 100

## 2019-03-10 MED ORDER — METOCLOPRAMIDE HCL 10 MG PO TABS
10.0000 mg | ORAL_TABLET | Freq: Three times a day (TID) | ORAL | Status: DC
  Administered 2019-03-11 – 2019-03-13 (×7): 10 mg via ORAL
  Filled 2019-03-10 (×7): qty 1

## 2019-03-10 MED ORDER — HYDROXYCHLOROQUINE SULFATE 200 MG PO TABS
200.0000 mg | ORAL_TABLET | Freq: Every day | ORAL | Status: DC
  Administered 2019-03-12 – 2019-03-13 (×2): 200 mg via ORAL
  Filled 2019-03-10 (×3): qty 1

## 2019-03-10 MED ORDER — WARFARIN - PHYSICIAN DOSING INPATIENT
Freq: Every day | Status: DC
  Filled 2019-03-10: qty 1

## 2019-03-10 MED ORDER — PANTOPRAZOLE SODIUM 40 MG PO TBEC
40.0000 mg | DELAYED_RELEASE_TABLET | Freq: Two times a day (BID) | ORAL | Status: DC
  Administered 2019-03-11 – 2019-03-13 (×5): 40 mg via ORAL
  Filled 2019-03-10 (×5): qty 1

## 2019-03-10 MED ORDER — LEVOTHYROXINE SODIUM 50 MCG PO TABS: 125.0000 ug | ORAL_TABLET | Freq: Every day | ORAL | Status: DC

## 2019-03-10 MED ORDER — MOMETASONE FURO-FORMOTEROL FUM 200-5 MCG/ACT IN AERO
2.0000 | INHALATION_SPRAY | Freq: Two times a day (BID) | RESPIRATORY_TRACT | Status: DC
  Filled 2019-03-10: qty 8.8

## 2019-03-10 MED ORDER — POLYETHYLENE GLYCOL 3350 17 G PO PACK: 17.0000 g | PACK | Freq: Every day | ORAL | Status: DC | PRN

## 2019-03-10 MED ORDER — LISINOPRIL 10 MG PO TABS
5.0000 mg | ORAL_TABLET | Freq: Every day | ORAL | Status: DC
  Administered 2019-03-11 – 2019-03-13 (×3): 5 mg via ORAL
  Filled 2019-03-10 (×3): qty 1

## 2019-03-10 MED ORDER — CIPROFLOXACIN IN D5W 400 MG/200ML IV SOLN
400.0000 mg | INTRAVENOUS | Status: DC
  Administered 2019-03-11: 400 mg via INTRAVENOUS
  Filled 2019-03-10 (×2): qty 200

## 2019-03-10 MED ORDER — ONDANSETRON HCL 4 MG/2ML IJ SOLN
4.0000 mg | Freq: Four times a day (QID) | INTRAMUSCULAR | Status: DC | PRN
  Administered 2019-03-11 (×2): 4 mg via INTRAVENOUS
  Filled 2019-03-10 (×2): qty 2

## 2019-03-10 MED ORDER — ONDANSETRON HCL 4 MG/2ML IJ SOLN
INTRAMUSCULAR | Status: AC
  Administered 2019-03-10: 18:00:00 4 mg via INTRAVENOUS
  Filled 2019-03-10: qty 2

## 2019-03-10 MED ORDER — PREDNISONE 5 MG PO TABS
5.0000 mg | ORAL_TABLET | Freq: Every day | ORAL | Status: DC
  Administered 2019-03-11 – 2019-03-13 (×3): 5 mg via ORAL
  Filled 2019-03-10 (×3): qty 1

## 2019-03-10 MED ORDER — SODIUM CHLORIDE 0.45 % IV SOLN
INTRAVENOUS | Status: DC
  Administered 2019-03-10: 23:00:00 via INTRAVENOUS

## 2019-03-10 NOTE — ED Notes (Signed)
Lab in room at this time to draw lab work.  Will continue to monitor.

## 2019-03-10 NOTE — ED Triage Notes (Signed)
First Nurse Note:  C/O vomiting after eating.  Onset of symptoms this morning.  States has had trouble with her esophagus before, but this feels different.  Patient is AAOx3.  Skin warm and dry. NAD

## 2019-03-10 NOTE — ED Triage Notes (Signed)
Pt to ED reporting vomiting that started today. No abd pain, diarrhea or fevers reported. Increased weakness and "spasms" in her esophagus which pt reports she has delt with in the past.   Pt reporting she was placed on antibiotics last week for an infection on her left leg. Leg leg is swollen at this time and appears to be infected.

## 2019-03-10 NOTE — ED Notes (Signed)
Date and time results received: 03/10/19 6:28 PM  (use smartphrase ".now" to insert current time)  Test: Troponin Critical Value: 0.06  Name of Provider Notified: Dr. Burlene Arnt  Orders Received? Or Actions Taken?: Acknowledged

## 2019-03-10 NOTE — ED Provider Notes (Addendum)
[facility] Emergency Department Provider Note  ____________________________________________   I have reviewed the triage vital signs and the nursing notes. Where available I have reviewed prior notes and, if possible and indicated, outside hospital notes.    HISTORY  Chief Complaint Emesis    HPI Diana [name] is a 84 y.o. female patient seen and evaluated during the coronavirus epidemic during a time with low staffing presents today complaining of vomiting today.  Also some lower abdominal discomfort.  Does have a history of diverticulitis.  Also has a small necrotic area on the left leg for several weeks for which she is taking antibiotics the name of which she cannot recall.  She denies any fever or chills.  Began vomiting today.  Nonbloody nonbilious.  Normal bowel movement this morning.  No diarrhea.  No melena no bright red blood per rectum.  Pain is mild.  Better now.  Lower left.  States she did not realize she was really having pain until I touch her.  It does feel like her diverticulitis, a little. On doxycycline for her leg, states that that is actually somewhat improving.   And also known to be on Coumadin.  Denies fever, had nausea medication on the way in and feels much better.  Past Medical History:  Diagnosis Date  . Acute glomerulonephritis with other specified pathological lesion in kidney in disease classified elsewhere(580.81)   . Alveolar aeration decreased   . Anemia   . Arthritis   . Atrial fibrillation ([place])   . Breast cancer ([place]) 06/03/2014   positive, radiation  . Cancer (HCC)    Breast  . CHF (congestive heart failure) ([place])   . Diaphragmatic hernia   . DVT (deep venous thrombosis) ([place])   . Dysrhythmia   . Environmental allergies   . Erosive esophagitis 03/28/2015   esophageal biopsy July 2017 c/w CMV cytopathic changes, negative for H Pylori and dysplasia     . Esophageal reflux   . Esophagitis, CMV ([place]) 05/26/2015    Diagnosed at [facility] with manometry,  Tube feeds started for enteral nutrtion 05/12/15   . Feeding problem    FEEDING TUBE  . Hiatal hernia   . Hyperlipidemia   . Hypertension   . Lower extremity edema   . Lupus (systemic lupus erythematosus) ([place])   . Neuropathy   . Osteopenia   . Oxygen deficiency    USES HS  . Peripheral neuropathy, hereditary/idiopathic   . Personal history of radiation therapy   . Pneumonia   . Primary pulmonary hypertension ([place])   . Pulmonary hypertension ([place])   . Renal insufficiency   . Sciatica   . Shingles   . Shingles (herpes zoster) polyneuropathy March 2013   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  . Shortness of breath dyspnea   . Unspecified hypothyroidism   . Unspecified menopausal and postmenopausal disorder   . Urinary incontinence without sensory awareness   . Vitamin B deficiency     Patient Active Problem List   Diagnosis Date Noted  . Acute frontal sinusitis 01/21/2019  . Abdominal pain, chronic, left lower quadrant 12/13/2018  . Osteoporosis 08/11/2018  . Nocturnal hypoxemia due to pulmonary hypertension ([place]) 07/29/2018  . Tubular adenoma of colon 06/08/2018  . Chronic right hip pain 05/09/2018  . Ventral hernia without obstruction or gangrene 11/09/2017  . Anticoagulation goal of INR 2 to 3 09/14/2016  . Acquired asplenia 08/21/2016  . Sjogrens syndrome ([place]) 08/21/2016  . Long term systemic steroid user 08/17/2016  .  Facet arthritis of lumbosacral region 12/16/2015  . History of Helicobacter pylori infection 08/17/2015  . H/O adenomatous polyp of colon 08/17/2015  . Hypertension 08/17/2015  . Hyperlipidemia, unspecified 08/17/2015  . Absolute anemia 07/17/2015  . History of compression fracture of vertebral column 07/03/2015  . Generalized weakness 07/03/2015  . Anemia, iron deficiency 07/03/2015  . History of fall within past 90 days 04/07/2015  . Protein-calorie malnutrition ([place]) 04/06/2015  . History of gastric surgery  03/24/2015  . Chronic nausea 03/24/2015  . History of shingles 11/10/2014  . Venous (peripheral) insufficiency 06/13/2014  . Breast cancer, left ([place]) 05/26/2014  . Malignant neoplasm of upper-outer quadrant of female breast ([place]) 05/26/2014  . Urinary incontinence 01/15/2014  . Other specified disorders of bladder 01/02/2014  . Familial multiple lipoprotein-type hyperlipidemia 08/21/2013  . History of recurrent pneumonia 05/26/2013  . Pulmonary hypertensive venous disease ([place]) 04/23/2013  . Diastolic heart failure ([place]) 04/23/2013  . Long term current use of anticoagulant therapy 03/27/2013  . Incomplete bladder emptying 03/06/2013  . Chronic venous embolism and thrombosis of deep vessels of lower extremity ([place]) 01/07/2013  . Hernia, hiatal 01/06/2013  . Diaphragmatic hernia 01/06/2013  . Left leg DVT ([place]) 12/18/2012  . Sciatica 11/20/2012  . Microscopic hematuria 11/14/2012  . Insomnia 11/08/2011  . Lupus erythematosus 06/13/2008  . NEUROPATHY-PERIPHERAL 10/06/2006  . Hereditary and idiopathic peripheral neuropathy 10/06/2006  . Hypothyroidism 09/25/2006  . ANEMIA, B12 DEFICIENCY 09/25/2006  . GERD 09/25/2006  . Lung involvement in systemic lupus erythematosus ([place]) 09/25/2006  . Type 2 diabetes mellitus without complications ([place]) 97/98/9211  . SHINGLES, HX OF 09/25/2006  . TOBACCO ABUSE, HX OF 09/25/2006  . Personal history of endocrine, metabolic or immunity disorder 09/25/2006    Past Surgical History:  Procedure Laterality Date  . BREAST BIOPSY Left 2015   invasive mammary carcinoma  . BREAST LUMPECTOMY Left 2015   invasive mammary carcinoma clear margins but close  . BREAST SURGERY    . CATARACT EXTRACTION W/PHACO Right 04/05/2016   Procedure: CATARACT EXTRACTION PHACO AND INTRAOCULAR LENS PLACEMENT (IOC);  Surgeon: Birder Robson, MD;  Location: [facility] ORS;  Service: Ophthalmology;  Laterality: Right;  [place] 01:20 AP% 23.9 CDE 19.29 fluid pack lot # [id number]  .  CATARACT EXTRACTION W/PHACO Left 04/26/2016   Procedure: CATARACT EXTRACTION PHACO AND INTRAOCULAR LENS PLACEMENT ([place]);  Surgeon: Birder Robson, MD;  Location: [facility] ORS;  Service: Ophthalmology;  Laterality: Left;   [place] 00:59 AP% 23.2 CDE 13.83 Fluid pack lot # [id number]  . COLONOSCOPY WITH PROPOFOL N/A 06/04/2018   Procedure: COLONOSCOPY WITH PROPOFOL;  Surgeon: Lollie Sails, MD;  Location: [facility] ENDOSCOPY;  Service: Endoscopy;  Laterality: N/A;  . DIAGNOSTIC LAPAROSCOPY    . ESOPHAGOGASTRODUODENOSCOPY N/A 07/19/2015   Procedure: ESOPHAGOGASTRODUODENOSCOPY (EGD);  Surgeon: Hulen Luster, MD;  Location: [facility] ENDOSCOPY;  Service: Gastroenterology;  Laterality: N/A;  . ESOPHAGOGASTRODUODENOSCOPY (EGD) WITH PROPOFOL N/A 03/28/2015   Procedure: ESOPHAGOGASTRODUODENOSCOPY (EGD) WITH PROPOFOL;  Surgeon: Robert Bellow, MD;  Location: [facility] ENDOSCOPY;  Service: Endoscopy;  Laterality: N/A;  . ESOPHAGOGASTRODUODENOSCOPY (EGD) WITH PROPOFOL N/A 06/10/2015   Procedure: ESOPHAGOGASTRODUODENOSCOPY (EGD) WITH PROPOFOL;  Surgeon: Lollie Sails, MD;  Location: [facility] ENDOSCOPY;  Service: Endoscopy;  Laterality: N/A;  . ESOPHAGOGASTRODUODENOSCOPY (EGD) WITH PROPOFOL N/A 10/20/2015   Procedure: ESOPHAGOGASTRODUODENOSCOPY (EGD) WITH PROPOFOL;  Surgeon: Lollie Sails, MD;  Location: [facility] ENDOSCOPY;  Service: Endoscopy;  Laterality: N/A;  . ESOPHAGOGASTRODUODENOSCOPY (EGD) WITH PROPOFOL N/A 02/23/2016   Procedure: ESOPHAGOGASTRODUODENOSCOPY (EGD) WITH PROPOFOL;  Surgeon:  Lollie Sails, MD;  Location: [facility] ENDOSCOPY;  Service: Endoscopy;  Laterality: N/A;  . ESOPHAGOGASTRODUODENOSCOPY (EGD) WITH PROPOFOL N/A 06/30/2016   Procedure: ESOPHAGOGASTRODUODENOSCOPY (EGD) WITH PROPOFOL;  Surgeon: Lollie Sails, MD;  Location: [facility] ENDOSCOPY;  Service: Endoscopy;  Laterality: N/A;  . ESOPHAGOGASTRODUODENOSCOPY (EGD) WITH PROPOFOL N/A 01/05/2017   Procedure: ESOPHAGOGASTRODUODENOSCOPY (EGD) WITH PROPOFOL;  Surgeon:  Lollie Sails, MD;  Location[facility]COPY;  Service: Endoscopy;  Laterality: N/A;  . KYPHOPLASTY N/A 02/10/2015   Procedure: [id number];  Surgeon: Hessie Knows, MD;  Location: [facility] ORS;  Service: Orthopedics;  Laterality: N/A;  . LAPAROTOMY N/A 03/11/2015   Procedure: REDUCTION OF GASTRIC VOLVULUS, SPLEENECTOMY, GASTRIC TUBE PLACEMENT;  Surgeon: Robert Bellow, MD;  Location: [facility] ORS;  Service: General;  Laterality: N/A;  . MOHS SURGERY    . PERIPHERAL VASCULAR CATHETERIZATION N/A 06/12/2015   Procedure: IVC Filter Insertion;  Surgeon: Algernon Huxley, MD;  Location: [place] CV LAB;  Service: Cardiovascular;  Laterality: N/A;  . PERIPHERAL VASCULAR CATHETERIZATION N/A 08/23/2016   Procedure: IVC Filter Removal;  Surgeon: Katha Cabal, MD;  Location: [place] CV LAB;  Service: Cardiovascular;  Laterality: N/A;  . STOMACH SURGERY     for volvolus    Prior to Admission medications   Medication Sig Start Date End Date Taking? Authorizing Provider  ALPRAZolam (XANAX) 0.25 MG tablet Take 1 tablet (0.25 mg total) by mouth 2 (two) times daily as needed for anxiety. 08/09/18   Crecencio Mc, MD  Ascorbic Acid (VITAMIN C) 1000 MG tablet Take 1,000 mg by mouth daily.    [provider]  budesonide-formoterol (SYMBICORT) 160-4.5 MCG/ACT inhaler Symbicort 160 mcg-4.5 mcg/actuation HFA aerosol inhaler    [provider]  Calcium Carbonate-Vitamin D 600-400 MG-UNIT tablet Take 1 tablet by mouth daily.    [provider]  cyanocobalamin (,VITAMIN B-12,) 1000 MCG/ML injection Inject 1 mL (1,000 mcg total) into the muscle every 30 (thirty) days. Pt uses on the 1st of every month. 12/28/16   Crecencio Mc, MD  doxycycline (VIBRA-TABS) 100 MG tablet Take 1 tablet (100 mg total) by mouth 2 (two) times daily. 03/07/19   Jodelle Green, FNP  fentaNYL (DURAGESIC) 50 MCG/HR Place 1 patch onto the skin every 3 (three) days. 02/19/19   Crecencio Mc, MD   hydroxychloroquine (PLAQUENIL) 200 MG tablet Take 200 mg by mouth daily.    [provider]  Lactulose 20 GM/30ML SOLN 30 ml every 4 hours until constipation is relieved 12/12/18   Crecencio Mc, MD  letrozole [facility]) 2.5 MG tablet TAKE 1 TABLET BY MOUTH EVERY DAY 02/23/19   Sindy Guadeloupe, MD  lisinopril (PRINIVIL,ZESTRIL) 5 MG tablet TAKE 1 TABLET (5 MG TOTAL) BY MOUTH DAILY. 05/23/18   Crecencio Mc, MD  metoCLOPramide (REGLAN) 10 MG tablet TAKE 1 TABLET BY MOUTH 4 TIMES DAILY - BEFORE MEALS AND AT BEDTIME. 06/29/18   Crecencio Mc, MD  metoprolol succinate (TOPROL-XL) 25 MG 24 hr tablet Take 12.5 mg by mouth daily.     [provider]  mirtazapine (REMERON) 15 MG tablet TAKE 1/2 TO 1 TABLET BY MOUTH NIGHTLY AT BEDTIME 02/06/19   Crecencio Mc, MD  mupirocin ointment (BACTROBAN) 2 % Apply 1 application topically 2 (two) times daily. Apply thin layer to affected area on leg 03/07/19   Guse, Jacquelynn Cree, FNP  mycophenolate (CELLCEPT) 500 MG tablet Take 1,000 mg by mouth 2 (two) times daily.    [provider]  ondansetron (ZOFRAN ODT) 4 MG disintegrating tablet Take 1 tablet (4 mg total) by mouth every 8 (eight) hours as needed for nausea or vomiting. 11/29/17   Harvest Dark, MD  oxyCODONE-acetaminophen (PERCOCET/ROXICET) 5-325 MG tablet Take 1 tablet by mouth 2 (two) times daily as needed for severe pain. 03/02/19   Crecencio Mc, MD  OXYGEN Inhale 2 mLs into the lungs at bedtime.    [provider]  pantoprazole (PROTONIX) 40 MG tablet Take 40 mg by mouth 2 (two) times daily.    [provider]  Polyvinyl Alcohol-Povidone (REFRESH OP) Place 2 drops into both eyes daily as needed (dry eyes).     [provider]  predniSONE (DELTASONE) 5 MG tablet Take 5 mg by mouth daily.     [provider]  prochlorperazine (COMPAZINE) 5 MG tablet Take 1 tablet (5 mg total) by mouth every 6 (six) hours as needed for nausea or vomiting. 01/10/19    Crecencio Mc, MD  Saccharomyces boulardii (PROBIOTIC) 250 MG CAPS Take 1 tablet by mouth daily. 12/02/17   Bettey Costa, MD  sucralfate (CARAFATE) 1 GM/10ML suspension Take 1 g by mouth 4 (four) times daily. Before meals and nightly    [provider]  SYNTHROID 125 MCG tablet Take 1 tablet (125 mcg total) by mouth daily. 02/08/19   Crecencio Mc, MD  Syringe/Needle, Disp, (SYRINGE 3CC/25GX1") 25G X 1" 3 ML MISC Use for b12 injections 12/28/16   Crecencio Mc, MD  Tadalafil, PAH, (ADCIRCA) 20 MG TABS Take 40 mg by mouth daily.    [provider]  warfarin (COUMADIN) 3 MG tablet Take 1 tablet (3 mg total) by mouth daily. 01/10/19   Crecencio Mc, MD  warfarin (COUMADIN) 6 MG tablet Take 1 tablet (6 mg total) by mouth daily. As directed.  PATIENT NEEDS TODAY 01/10/19   Crecencio Mc, MD    Allergies Iodinated diagnostic agents, Tramadol, Amoxicillin, Cefuroxime, Metrizamide, Morphine and related, Propoxyphene, Cephalexin, Sulfa antibiotics, Sulfonamide derivatives, Tramadol hcl, and Venlafaxine  Family History  Problem Relation Age of Onset  . Colon cancer Mother   . Cirrhosis Brother        alcoholic  . Alcohol abuse Sister   . Breast cancer Sister 50  . Cancer Brother   . Lung cancer Son   . Meniere's disease Son   . Cirrhosis Brother        nonalcoholic     Social History Social History   Tobacco Use  . Smoking status: Former Smoker    Quit date: 05/25/1991    Years since quitting: 27.8  . Smokeless tobacco: Never Used  Substance Use Topics  . Alcohol use: No    Frequency: Never    Comment: rare  . Drug use: No    Review of Systems Constitutional: No fever/chills Eyes: No visual changes. ENT: No sore throat. No stiff neck no neck pain Cardiovascular: Denies chest pain. Respiratory: Denies shortness of breath. Gastrointestinal:   + vomiting.  No diarrhea.  No constipation. Genitourinary: Negative for dysuria. Musculoskeletal: Negative lower  extremity swelling Skin: Negative for rash. Neurological: Negative for severe headaches, focal weakness or numbness.   ____________________________________________   PHYSICAL EXAM:  VITAL SIGNS: ED Triage Vitals  Enc Vitals Group     BP 03/10/19 1725 110/71     Pulse Rate 03/10/19 1725 (!) 110     Resp 03/10/19 1725 16     Temp 03/10/19 1725 99.2 F (37.3  C)     Temp Source 03/10/19 1725 Oral     SpO2 03/10/19 1725 97 %     Weight 03/10/19 1726 104 lb (47.2 kg)     Height 03/10/19 1726 5\' 1"  (1.549 m)     Head Circumference --      Peak Flow --      Pain Score 03/10/19 1726 7     Pain Loc --      Pain Edu? --      Excl. in [place]? --     Constitutional: Alert and oriented. Well appearing and in no acute distress. Eyes: Conjunctivae are normal Head: Atraumatic HEENT: No congestion/rhinnorhea. Mucous membranes are moist.  Oropharynx non-erythematous Neck:   Nontender with no meningismus, no masses, no stridor Cardiovascular: Normal rate, regular rhythm. Grossly normal heart sounds.  Good peripheral circulation. Respiratory: Normal respiratory effort.  No retractions. Lungs CTAB. Abdominal: Soft and positive left lower quadrant discomfort. No distention. No guarding no rebound Back:  There is no focal tenderness or step off.  there is no midline tenderness there are no lesions noted. there is no CVA tenderness Musculoskeletal: No lower extremity tenderness, no upper extremity tenderness. No joint effusions, no DVT signs strong distal pulses no edema Neurologic:  Normal speech and language. No gross focal neurologic deficits are appreciated.  Skin:  Skin is warm, dry and intact.  There is a silver dollar sized area of open skin to the left leg which is chronic in appearance.  There is some mild warmth around the area.Marland Kitchen Psychiatric: Mood and affect are normal. Speech and behavior are normal.  ____________________________________________   LABS (all labs ordered are listed, but  only abnormal results are displayed)  Labs Reviewed  CBC WITH DIFFERENTIAL/PLATELET - Abnormal; Notable for the following components:      Result Value   RBC 3.39 (*)    Hemoglobin 10.5 (*)    MCV 110.0 (*)    MCHC 28.2 (*)    RDW 16.9 (*)    nRBC 1.9 (*)    All other components within normal limits  LACTIC ACID, PLASMA  LACTIC ACID, PLASMA  COMPREHENSIVE METABOLIC PANEL  TROPONIN I    Pertinent labs  results that were available during my care of the patient were reviewed by me and considered in my medical decision making (see chart for details). ____________________________________________  EKG  I personally interpreted any EKGs ordered by me or triage Sinus rate 107 mild tachycardia noted normal axis no acute ST elevation nonspecific ST changes, ____________________________________________  RADIOLOGY  Pertinent labs & imaging results that were available during my care of the patient were reviewed by me and considered in my medical decision making (see chart for details). If possible, patient and/or family made aware of any abnormal findings.  No results found. ____________________________________________    PROCEDURES  Procedure(s) performed: None  Procedures  Critical Care performed: None  ____________________________________________   INITIAL IMPRESSION / ASSESSMENT AND PLAN / ED COURSE  Pertinent labs & imaging results that were available during my care of the patient were reviewed by me and considered in my medical decision making (see chart for details).  Patient here with vomiting today, on doxycycline for a skin infection which began as a small trauma 2 weeks ago.  Some mild abdominal discomfort given history of diverticulitis, we will obtain CT scan, we will give her IV fluids, she does have I Coumadin regimen in place so we will check INR.  Patient has 12 different intolerances  to medications including I needed diagnostic agents and a host of antibiotics  so she does have diverticulitis we will have to think long and hard about what to treat her with.  There is some erythema and there is a chronic skin opening no crepitus noted.  Certainly that could also be making her ill.  We will see what her blood work shows her CT scan and reassess  ----------------------------------------- 8:04 PM on 03/10/2019 -----------------------------------------  Patient with diverticulitis with no evidence of perforation also evidence of dehydration with a sodium 149 son states she is been having trouble with p.o. for some time CT scan shows a diffuse gastritis versus possible malignancy, I have let the patient and her son know about this as well as the hospitalist service.  We are going to give her Cipro Flagyl, she will require IV fluid and admission,    ____________________________________________   FINAL CLINICAL IMPRESSION(S) / ED DIAGNOSES  Final diagnoses:  None      This chart was dictated using voice recognition software.  Despite best efforts to proofread,  errors can occur which can change meaning.      Schuyler Amor, MD 03/10/19 1817    Schuyler Amor, MD 03/10/19 [place]    Schuyler Amor, MD 03/10/19 2005

## 2019-03-10 NOTE — Progress Notes (Signed)
PHARMACY NOTE:  ANTIMICROBIAL RENAL DOSAGE ADJUSTMENT  Current antimicrobial regimen includes a mismatch between antimicrobial dosage and estimated renal function.  As per policy approved by the Pharmacy & Therapeutics and Medical Executive Committees, the antimicrobial dosage will be adjusted accordingly.  Current antimicrobial dosage:  Cipro 400mg  IV q 12h  Indication: intra-abdominal infection  Renal Function: 18 ml/min  Estimated Creatinine Clearance: 18 mL/min (A) (by C-G formula based on SCr of 1.8 mg/dL (H)).     Antimicrobial dosage has been changed to:  Cipro IV 400mg  q 24h  Additional comments:   Thank you for allowing pharmacy to be a part of this patient's care.  Lu Duffel, PharmD, BCPS Clinical Pharmacist 03/10/2019 10:19 PM

## 2019-03-10 NOTE — H&P (Signed)
[facility] [place] [facility]   PATIENT NAME: Diana [name]    MR#:  [id number]  DATE OF BIRTH:  02/16/1935  DATE OF ADMISSION:  03/10/2019  PRIMARY CARE PHYSICIAN: Sherlene Shams, MD   REQUESTING/REFERRING PHYSICIAN: Ileana Roup, MD  CHIEF COMPLAINT:   Chief Complaint  Patient presents with   Emesis    HISTORY OF PRESENT ILLNESS:  Diana [name]  is a 84 y.o. female with a known history of esophagitis, CKD, atrial fibrillation, CHF, hypertension, history of DVT on anticoagulant therapy with Coumadin.  She presented to the emergency room complaining of nausea and vomiting which started earlier in the day as well as difficulty swallowing.  Patient reports difficulty swallowing solid foods and liquids.  She is also complaining of left lower quadrant abdominal pain sharp and cramping at times with pain score rated 5 out of 10.  She denies diarrhea.  She denies fevers or chills.  She denies hematemesis, hematochezia, or melena.  She denies chest pain or shortness of breath.  She has a history of H. pylori treated by Dr. Luiz Iron in December 2019 as well as a history of CMV esophagitis.  She is currently being treated with Reglan, Carafate, and twice daily PPI.  CT of abdomen and pelvis demonstrates a long segment of mucosal thickening of the ascending, transverse, and sigmoid colon likely representing diverticulitis.  Additionally, patient also has anterior left lower extremity wound surrounded by edema and erythema with yellow and green purulent drainage.  She has noted symptoms since Thursday, 4 days ago.  Patient denies having noted fevers or chills.  WBC is 5.8.  Lactic acid is 1.2.  Sodium is 149.  She has been admitted to the hospitalist service for further management.  PAST MEDICAL HISTORY:   Past Medical History:  Diagnosis Date   Acute glomerulonephritis with other specified pathological lesion in kidney in disease classified elsewhere(580.81)     Alveolar aeration decreased    Anemia    Arthritis    Atrial fibrillation (HCC)    Breast cancer (HCC) 2020-09-2613   positive, radiation   Cancer (HCC)    Breast   CHF (congestive heart failure) (HCC)    Diaphragmatic hernia    DVT (deep venous thrombosis) (HCC)    Dysrhythmia    Environmental allergies    Erosive esophagitis 03/28/2015   esophageal biopsy July 2017 c/w CMV cytopathic changes, negative for H Pylori and dysplasia      Esophageal reflux    Esophagitis, CMV (HCC) 05/26/2015   Diagnosed at [facility] with manometry,  Tube feeds started for enteral nutrtion 05/12/15    Feeding problem    FEEDING TUBE   Hiatal hernia    Hyperlipidemia    Hypertension    Lower extremity edema    Lupus (systemic lupus erythematosus) (HCC)    Neuropathy    Osteopenia    Oxygen deficiency    USES HS   Peripheral neuropathy, hereditary/idiopathic    Personal history of radiation therapy    Pneumonia    Primary pulmonary hypertension (HCC)    Pulmonary hypertension (HCC)    Renal insufficiency    Sciatica    Shingles    Shingles (herpes zoster) polyneuropathy March 2013   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   Shortness of breath dyspnea    Unspecified hypothyroidism    Unspecified menopausal and postmenopausal disorder    Urinary incontinence without sensory awareness    Vitamin B deficiency     PAST SURGICAL  HISTORY:   Past Surgical History:  Procedure Laterality Date   BREAST BIOPSY Left 2015   invasive mammary carcinoma   BREAST LUMPECTOMY Left 2015   invasive mammary carcinoma clear margins but close   BREAST SURGERY     CATARACT EXTRACTION W/PHACO Right 04/05/2016   Procedure: CATARACT EXTRACTION PHACO AND INTRAOCULAR LENS PLACEMENT (IOC);  Surgeon: Galen Manila, MD;  Location: [facility] ORS;  Service: Ophthalmology;  Laterality: Right;  [place] 01:20 AP% 23.9 CDE 19.29 fluid pack lot # [id number]   CATARACT EXTRACTION W/PHACO Left 04/26/2016    Procedure: CATARACT EXTRACTION PHACO AND INTRAOCULAR LENS PLACEMENT (IOC);  Surgeon: Galen Manila, MD;  Location: [facility] ORS;  Service: Ophthalmology;  Laterality: Left;   [place] 00:59 AP% 23.2 CDE 13.83 Fluid pack lot # [id number]   COLONOSCOPY WITH PROPOFOL N/A 06/04/2018   Procedure: COLONOSCOPY WITH PROPOFOL;  Surgeon: Christena Deem, MD;  Location: [facility] ENDOSCOPY;  Service: Endoscopy;  Laterality: N/A;   DIAGNOSTIC LAPAROSCOPY     ESOPHAGOGASTRODUODENOSCOPY N/A 07/19/2015   Procedure: ESOPHAGOGASTRODUODENOSCOPY (EGD);  Surgeon: Wallace Cullens, MD;  Location: [facility] ENDOSCOPY;  Service: Gastroenterology;  Laterality: N/A;   ESOPHAGOGASTRODUODENOSCOPY (EGD) WITH PROPOFOL N/A 03/28/2015   Procedure: ESOPHAGOGASTRODUODENOSCOPY (EGD) WITH PROPOFOL;  Surgeon: Earline Mayotte, MD;  Location: [facility] ENDOSCOPY;  Service: Endoscopy;  Laterality: N/A;   ESOPHAGOGASTRODUODENOSCOPY (EGD) WITH PROPOFOL N/A 06/10/2015   Procedure: ESOPHAGOGASTRODUODENOSCOPY (EGD) WITH PROPOFOL;  Surgeon: Christena Deem, MD;  Location: [facility] ENDOSCOPY;  Service: Endoscopy;  Laterality: N/A;   ESOPHAGOGASTRODUODENOSCOPY (EGD) WITH PROPOFOL N/A 10/20/2015   Procedure: ESOPHAGOGASTRODUODENOSCOPY (EGD) WITH PROPOFOL;  Surgeon: Christena Deem, MD;  Location: [facility] ENDOSCOPY;  Service: Endoscopy;  Laterality: N/A;   ESOPHAGOGASTRODUODENOSCOPY (EGD) WITH PROPOFOL N/A 02/23/2016   Procedure: ESOPHAGOGASTRODUODENOSCOPY (EGD) WITH PROPOFOL;  Surgeon: Christena Deem, MD;  Location: [facility] ENDOSCOPY;  Service: Endoscopy;  Laterality: N/A;   ESOPHAGOGASTRODUODENOSCOPY (EGD) WITH PROPOFOL N/A 06/30/2016   Procedure: ESOPHAGOGASTRODUODENOSCOPY (EGD) WITH PROPOFOL;  Surgeon: Christena Deem, MD;  Location: [facility] ENDOSCOPY;  Service: Endoscopy;  Laterality: N/A;   ESOPHAGOGASTRODUODENOSCOPY (EGD) WITH PROPOFOL N/A 01/05/2017   Procedure: ESOPHAGOGASTRODUODENOSCOPY (EGD) WITH PROPOFOL;  Surgeon: Christena Deem, MD;  Location: [facility]  ENDOSCOPY;  Service: Endoscopy;  Laterality: N/A;   KYPHOPLASTY N/A 02/10/2015   Procedure: [id number]/I3;  Surgeon: Kennedy Bucker, MD;  Location: [facility] ORS;  Service: Orthopedics;  Laterality: N/A;   LAPAROTOMY N/A 03/11/2015   Procedure: REDUCTION OF GASTRIC VOLVULUS, SPLEENECTOMY, GASTRIC TUBE PLACEMENT;  Surgeon: Earline Mayotte, MD;  Location: [facility] ORS;  Service: General;  Laterality: N/A;   MOHS SURGERY     PERIPHERAL VASCULAR CATHETERIZATION N/A 06/12/2015   Procedure: IVC Filter Insertion;  Surgeon: Annice Needy, MD;  Location: [facility] INVASIVE CV LAB;  Service: Cardiovascular;  Laterality: N/A;   PERIPHERAL VASCULAR CATHETERIZATION N/A 08/23/2016   Procedure: IVC Filter Removal;  Surgeon: Renford Dills, MD;  Location: [facility] INVASIVE CV LAB;  Service: Cardiovascular;  Laterality: N/A;   STOMACH SURGERY     for volvolus    SOCIAL HISTORY:   Social History   Tobacco Use   Smoking status: Former Smoker    Quit date: 05/25/1991    Years since quitting: 27.8   Smokeless tobacco: Never Used  Substance Use Topics   Alcohol use: No    Frequency: Never    Comment: rare    FAMILY HISTORY:   Family History  Problem Relation Age of Onset   Colon cancer Mother    Cirrhosis Brother  alcoholic   Alcohol abuse Sister    Breast cancer Sister 75   Cancer Brother    Lung cancer Son    Meniere's disease Son    Cirrhosis Brother        nonalcoholic     DRUG ALLERGIES:   Allergies  Allergen Reactions   Iodinated Diagnostic Agents Hives    Pt states she broke out in hives years ago, has been premedicated in the past.    Tramadol Other (See Comments) and Rash    Rash   Amoxicillin Other (See Comments)    Reaction:  Stomach cramps    Cefuroxime Itching   Metrizamide Hives   Morphine And Related     Reaction: face flushed     Propoxyphene     Stomach cramp   Cephalexin Rash   Sulfa Antibiotics Rash    REACTION: Rash   Sulfonamide Derivatives  Rash   Tramadol Hcl Rash   Venlafaxine Rash    REVIEW OF SYSTEMS:   Review of Systems  Constitutional: Positive for malaise/fatigue. Negative for fever.  HENT: Negative for congestion, sinus pain and sore throat.   Eyes: Negative for blurred vision, double vision and pain.  Respiratory: Negative for cough, sputum production and shortness of breath.   Cardiovascular: Negative for chest pain and palpitations.  Gastrointestinal: Positive for abdominal pain (Left lower quadrant), nausea and vomiting. Negative for blood in stool, constipation, heartburn and melena.  Genitourinary: Negative for dysuria, flank pain, frequency, hematuria and urgency.  Musculoskeletal: Negative for falls and myalgias.  Skin: Negative for itching and rash.       Left lower extremity wound  Neurological: Negative for dizziness, loss of consciousness, weakness and headaches.  Psychiatric/Behavioral: Negative.  Negative for depression.      MEDICATIONS AT HOME:   Prior to Admission medications   Medication Sig Start Date End Date Taking? Authorizing Provider  Ascorbic Acid (VITAMIN C) 1000 MG tablet Take 1,000 mg by mouth daily.   Yes [provider]  Calcium Carbonate-Vitamin D 600-400 MG-UNIT tablet Take 1 tablet by mouth daily.   Yes [provider]  cyanocobalamin (,VITAMIN B-12,) 1000 MCG/ML injection Inject 1 mL (1,000 mcg total) into the muscle every 30 (thirty) days. Pt uses on the 1st of every month. 12/28/16  Yes Sherlene Shams, MD  doxycycline (VIBRA-TABS) 100 MG tablet Take 1 tablet (100 mg total) by mouth 2 (two) times daily. 03/07/19  Yes Guse, Janna Arch, FNP  fentaNYL (DURAGESIC) 50 MCG/HR Place 1 patch onto the skin every 3 (three) days. 02/19/19  Yes Sherlene Shams, MD  hydroxychloroquine (PLAQUENIL) 200 MG tablet Take 200 mg by mouth daily.   Yes [provider]  letrozole (FEMARA) 2.5 MG tablet TAKE 1 TABLET BY MOUTH EVERY DAY 02/23/19  Yes Creig Hines, MD    lisinopril (PRINIVIL,ZESTRIL) 5 MG tablet TAKE 1 TABLET (5 MG TOTAL) BY MOUTH DAILY. 05/23/18  Yes Sherlene Shams, MD  metoCLOPramide (REGLAN) 10 MG tablet TAKE 1 TABLET BY MOUTH 4 TIMES DAILY - BEFORE MEALS AND AT BEDTIME. 06/29/18  Yes Sherlene Shams, MD  metoprolol succinate (TOPROL-XL) 25 MG 24 hr tablet Take 12.5 mg by mouth daily.    Yes [provider]  mupirocin ointment (BACTROBAN) 2 % Apply 1 application topically 2 (two) times daily. Apply thin layer to affected area on leg 03/07/19  Yes Guse, Janna Arch, FNP  mycophenolate (CELLCEPT) 500 MG tablet Take 1,000 mg by mouth 2 (two) times daily.  Yes [provider]  ondansetron (ZOFRAN ODT) 4 MG disintegrating tablet Take 1 tablet (4 mg total) by mouth every 8 (eight) hours as needed for nausea or vomiting. 11/29/17  Yes Minna Antis, MD  oxyCODONE-acetaminophen (PERCOCET/ROXICET) 5-325 MG tablet Take 1 tablet by mouth 2 (two) times daily as needed for severe pain. 03/02/19  Yes Sherlene Shams, MD  pantoprazole (PROTONIX) 40 MG tablet Take 40 mg by mouth 2 (two) times daily.   Yes [provider]  predniSONE (DELTASONE) 5 MG tablet Take 5 mg by mouth daily.    Yes [provider]  prochlorperazine (COMPAZINE) 5 MG tablet Take 1 tablet (5 mg total) by mouth every 6 (six) hours as needed for nausea or vomiting. 01/10/19  Yes Sherlene Shams, MD  Saccharomyces boulardii (PROBIOTIC) 250 MG CAPS Take 1 tablet by mouth daily. 12/02/17  Yes Mody, Patricia Pesa, MD  SYNTHROID 125 MCG tablet Take 1 tablet (125 mcg total) by mouth daily. 02/08/19  Yes Sherlene Shams, MD  Tadalafil, PAH, (ADCIRCA) 20 MG TABS Take 40 mg by mouth daily.   Yes [provider]  warfarin (COUMADIN) 3 MG tablet Take 1 tablet (3 mg total) by mouth daily. Patient taking differently: Take 3 mg by mouth daily. Does not take on Saturday or Tuesday 01/10/19  Yes Sherlene Shams, MD  ALPRAZolam Prudy Feeler) 0.25 MG tablet Take 1 tablet (0.25 mg  total) by mouth 2 (two) times daily as needed for anxiety. Patient not taking: Reported on 03/10/2019 08/09/18   Sherlene Shams, MD  budesonide-formoterol [facility]) 160-4.5 MCG/ACT inhaler Symbicort 160 mcg-4.5 mcg/actuation HFA aerosol inhaler    [provider]  Lactulose 20 GM/30ML SOLN 30 ml every 4 hours until constipation is relieved Patient not taking: Reported on 03/10/2019 12/12/18   Sherlene Shams, MD  mirtazapine (REMERON) 15 MG tablet TAKE 1/2 TO 1 TABLET BY MOUTH NIGHTLY AT BEDTIME Patient not taking: Reported on 03/10/2019 02/06/19   Sherlene Shams, MD  OXYGEN Inhale 2 mLs into the lungs at bedtime.    [provider]  Polyvinyl Alcohol-Povidone (REFRESH OP) Place 2 drops into both eyes daily as needed (dry eyes).     [provider]  sucralfate (CARAFATE) 1 GM/10ML suspension Take 1 g by mouth 4 (four) times daily. Before meals and nightly    [provider]  Syringe/Needle, Disp, (SYRINGE 3CC/25GX1") 25G X 1" 3 ML MISC Use for b12 injections 12/28/16   Sherlene Shams, MD  warfarin (COUMADIN) 6 MG tablet Take 1 tablet (6 mg total) by mouth daily. As directed.  PATIENT NEEDS TODAY Patient not taking: Reported on 03/10/2019 01/10/19   Sherlene Shams, MD      VITAL SIGNS:  Blood pressure 114/69, pulse (!) 102, temperature 98.5 F (36.9 C), temperature source Oral, resp. rate 20, height 5\' 1"  (1.549 m), weight 47.4 kg, SpO2 96 %.  PHYSICAL EXAMINATION:  Physical Exam Vitals signs and nursing note reviewed.  Constitutional:      General: She is not in acute distress.    Appearance: Normal appearance.  HENT:     Head: Normocephalic.     Right Ear: External ear normal.     Left Ear: External ear normal.     Nose: Nose normal.     Mouth/Throat:     Mouth: Mucous membranes are moist.     Pharynx: Oropharynx is clear.  Eyes:     General: No scleral icterus.    Extraocular Movements:  Extraocular movements intact.     Conjunctiva/sclera:  Conjunctivae normal.     Pupils: Pupils are equal, round, and reactive to light.  Neck:     Musculoskeletal: Normal range of motion and neck supple. No muscular tenderness.  Cardiovascular:     Rate and Rhythm: Normal rate and regular rhythm.     Heart sounds: No murmur. No friction rub. No gallop.   Pulmonary:     Effort: Pulmonary effort is normal.     Breath sounds: Normal breath sounds. No wheezing, rhonchi or rales.  Chest:     Chest wall: No tenderness.  Abdominal:     General: Bowel sounds are normal.     Palpations: Abdomen is soft.     Tenderness: There is abdominal tenderness (Left lower quadrant). There is no guarding or rebound.  Musculoskeletal: Normal range of motion.        General: Tenderness (Anterior left lower extremity with erythema and necrotic wound) present. No swelling.     Left lower leg: Edema present.  Skin:    Capillary Refill: Capillary refill takes less than 2 seconds.     Coloration: Skin is not jaundiced.     Findings: Erythema (Left lower extremity) present. No rash.  Neurological:     Mental Status: She is alert and oriented to person, place, and time.     Cranial Nerves: No cranial nerve deficit.     Sensory: No sensory deficit.  Psychiatric:        Mood and Affect: Mood normal.        Behavior: Behavior normal.    LABORATORY PANEL:   CBC Recent Labs  Lab 03/10/19 1746  WBC 5.8  HGB 10.5*  HCT 37.3  PLT 275   ------------------------------------------------------------------------------------------------------------------  Chemistries  Recent Labs  Lab 03/10/19 1746  NA 149*  K 4.3  CL 119*  CO2 15*  GLUCOSE 69*  BUN 39*  CREATININE 1.80*  CALCIUM 9.0  AST 26  ALT 13  ALKPHOS 129*  BILITOT 0.5   ------------------------------------------------------------------------------------------------------------------  Cardiac Enzymes Recent Labs  Lab 03/10/19 2210  TROPONINI 0.08*    ------------------------------------------------------------------------------------------------------------------  RADIOLOGY:  Ct Abdomen Pelvis Wo Contrast  Result Date: 03/10/2019 CLINICAL DATA:  Vomiting. EXAM: CT ABDOMEN AND PELVIS WITHOUT CONTRAST TECHNIQUE: Multidetector CT imaging of the abdomen and pelvis was performed following the standard protocol without IV contrast. COMPARISON:  07/13/2018 FINDINGS: Lower chest: Bibasilar atelectasis. Large hiatal hernia. Calcific atherosclerotic disease of the coronary arteries. Hepatobiliary: No focal liver abnormality is seen. No gallstones, gallbladder wall thickening, or biliary dilatation. Pancreas: Atrophic pancreas. Spleen: The spleen is surgically. Adrenals/Urinary Tract: Normal adrenal glands. Bilateral renal cysts, the largest in the midpole region of the left kidney measuring 3.1 cm. Normal urinary bladder. Stomach/Bowel: Large hiatal hernia with diffuse circumferential mucosal thickening of the gastric mucosa within the hernial sac and in the anatomic location of the stomach. No evidence of small-bowel obstruction. Extensive colonic diverticulosis. Long segment of mucosal thickening of the ascending, transverse and sigmoid colon likely represent diverticulitis or other forms of colitis. Vascular/Lymphatic: Calcific atherosclerotic disease and severe tortuosity of the aorta. Reproductive: Uterus and bilateral adnexa are unremarkable. Other: No abdominal wall hernia or abnormality. No abdominopelvic ascites. Musculoskeletal: Osteopenia. Scoliosis. Prior vertebroplasty. Diffuse spondylosis of the lumbosacral spine. IMPRESSION: 1. Large hiatal hernia with diffuse circumferential mucosal thickening of the gastric mucosa within the hernial sac and in the anatomic location of the stomach. This may represent diffuse gastritis, however gastric malignancy cannot be entirely excluded.  2. Extensive colonic diverticulosis. Long segment of mucosal thickening of  the ascending, transverse and sigmoid colon, likely representing diverticulitis or other form of colitis. 3. Calcific atherosclerotic disease of the aorta and marked tortuosity. Electronically Signed   By: Ted Mcalpine M.D.   On: 03/10/2019 19:12   Dg Chest Port 1 View  Result Date: 03/10/2019 CLINICAL DATA:  Vomiting. Weakness. EXAM: PORTABLE CHEST 1 VIEW COMPARISON:  03/31/2015 FINDINGS: Cardiomediastinal silhouette is enlarged. Mediastinal contours appear intact. Tortuosity and calcific atherosclerotic disease of the aorta. Large hiatal hernia. There is no evidence of focal airspace consolidation, pleural effusion or pneumothorax. Osseous structures are without acute abnormality. Soft tissues are grossly normal. IMPRESSION: 1. Enlarged cardiac silhouette. 2. Large hiatal hernia. Electronically Signed   By: Ted Mcalpine M.D.   On: 03/10/2019 19:14      IMPRESSION AND PLAN:   1.  Diverticulitis - IV antibiotic therapy with ciprofloxacin and Flagyl - We will treat pain with analgesic - Will manage nausea and vomiting with IV antiemetic  2.  Left lower extremity cellulitis --Patient is on IV antibiotic therapy with ciprofloxacin and Flagyl - Wound culture -Wound care nurse consulted for management recommendations - Left lower extremity venous ultrasound rule out DVT  3. dysphagia - With a history of CMV esophagitis - Reglan, Carafate, and PPI therapy have been continued. - Dr. Servando Snare with gastroenterology has been consulted for further evaluation and recommendations. -Speech therapy consulted for swallowing evaluation  4.  Dehydration - Patient has half-normal saline infusing to a peripheral IV at 50 cc/h. She received 500 cc bolus in the emergency room. -We will repeat BMP and CBC in the a.m.  5.  History of DVT - INR is 2.1-patient is on anticoagulation with Coumadin  6.  Hypothyroidism -TSH pending -Synthroid restarted  7.  CKD -With BUN 39 and creatinine  1.80 -We will repeat BMP in the a.m. and continue to monitor renal function closely  DVT and PPI prophylaxis initiated -Repeat INR in the a.m.       All the records are reviewed and case discussed with ED provider. The plan of care was discussed in details with the patient (and family). I answered all questions. The patient agreed to proceed with the above mentioned plan. Further management will depend upon hospital course.   CODE STATUS: Full code  TOTAL TIME TAKING CARE OF THIS PATIENT: .    Milas Kocher Othman Masur CRNP on 03/10/2019 at 11:30 PM  Pager - [phone]  After 6pm go to [email or web address] - Social research officer, government  [facility] [place] [facility]  Office  [phone]  CC: Primary care physician; Sherlene Shams, MD   Note: This dictation was prepared with Dragon dictation along with smaller phrase technology. Any transcriptional errors that result from this process are unintentional.

## 2019-03-10 NOTE — ED Notes (Signed)
ED TO INPATIENT HANDOFF REPORT  ED Nurse Name and Phone #: Wells Guiles 34  S Name/Age/Gender Diana [name] 84 y.o. female Room/Bed: ED04A/ED04A  Code Status   Code Status: Full Code  Home/SNF/Other Home Patient oriented to: self, place, time and situation Is this baseline? Yes   Triage Complete: Triage complete  Chief Complaint Vomiting  Triage Note First Nurse Note:  C/O vomiting after eating.  Onset of symptoms this morning.  States has had trouble with her esophagus before, but this feels different.  Patient is AAOx3.  Skin warm and dry. NAD  Pt to ED reporting vomiting that started today. No abd pain, diarrhea or fevers reported. Increased weakness and "spasms" in her esophagus which pt reports she has delt with in the past.   Pt reporting she was placed on antibiotics last week for an infection on her left leg. Leg leg is swollen at this time and appears to be infected.    Allergies Allergies  Allergen Reactions  . Iodinated Diagnostic Agents Hives    Pt states she broke out in hives years ago, has been premedicated in the past.   . Tramadol Other (See Comments) and Rash    Rash  . Amoxicillin Other (See Comments)    Reaction:  Stomach cramps   . Cefuroxime Itching  . Metrizamide Hives  . Morphine And Related     Reaction: face flushed    . Propoxyphene     Stomach cramp  . Cephalexin Rash  . Sulfa Antibiotics Rash    REACTION: Rash  . Sulfonamide Derivatives Rash  . Tramadol Hcl Rash  . Venlafaxine Rash    Level of Care/Admitting Diagnosis ED Disposition    ED Disposition Condition [facility] Area: [place] [[id number]]  Level of Care: Med-Surg [16]  Covid Evaluation: Screening Protocol (No Symptoms)  Diagnosis: Diverticulitis [979892]  Admitting Physician: Mayer Camel [[id number]]  Attending Physician: Mayer Camel [[id number]]  Estimated length of stay: past midnight tomorrow  Certification:: I certify this  patient will need inpatient services for at least 2 midnights  PT Class (Do Not Modify): Inpatient [101]  PT Acc Code (Do Not Modify): Private [1]       B Medical/Surgery History Past Medical History:  Diagnosis Date  . Acute glomerulonephritis with other specified pathological lesion in kidney in disease classified elsewhere(580.81)   . Alveolar aeration decreased   . Anemia   . Arthritis   . Atrial fibrillation ([place])   . Breast cancer ([place]) 06/03/2014   positive, radiation  . Cancer (HCC)    Breast  . CHF (congestive heart failure) ([place])   . Diaphragmatic hernia   . DVT (deep venous thrombosis) ([place])   . Dysrhythmia   . Environmental allergies   . Erosive esophagitis 03/28/2015   esophageal biopsy July 2017 c/w CMV cytopathic changes, negative for H Pylori and dysplasia     . Esophageal reflux   . Esophagitis, CMV ([place]) 05/26/2015   Diagnosed at [facility] with manometry,  Tube feeds started for enteral nutrtion 05/12/15   . Feeding problem    FEEDING TUBE  . Hiatal hernia   . Hyperlipidemia   . Hypertension   . Lower extremity edema   . Lupus (systemic lupus erythematosus) ([place])   . Neuropathy   . Osteopenia   . Oxygen deficiency    USES HS  . Peripheral neuropathy, hereditary/idiopathic   . Personal history of radiation therapy   . Pneumonia   .  Primary pulmonary hypertension ([place])   . Pulmonary hypertension ([place])   . Renal insufficiency   . Sciatica   . Shingles   . Shingles (herpes zoster) polyneuropathy March 2013   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  . Shortness of breath dyspnea   . Unspecified hypothyroidism   . Unspecified menopausal and postmenopausal disorder   . Urinary incontinence without sensory awareness   . Vitamin B deficiency    Past Surgical History:  Procedure Laterality Date  . BREAST BIOPSY Left 2015   invasive mammary carcinoma  . BREAST LUMPECTOMY Left 2015   invasive mammary carcinoma clear margins but close  . BREAST SURGERY    . CATARACT  EXTRACTION W/PHACO Right 04/05/2016   Procedure: CATARACT EXTRACTION PHACO AND INTRAOCULAR LENS PLACEMENT (IOC);  Surgeon: Birder Robson, MD;  Location: [facility] ORS;  Service: Ophthalmology;  Laterality: Right;  [place] 01:20 AP% 23.9 CDE 19.29 fluid pack lot # [id number]  . CATARACT EXTRACTION W/PHACO Left 04/26/2016   Procedure: CATARACT EXTRACTION PHACO AND INTRAOCULAR LENS PLACEMENT ([place]);  Surgeon: Birder Robson, MD;  Location: [facility] ORS;  Service: Ophthalmology;  Laterality: Left;   [place] 00:59 AP% 23.2 CDE 13.83 Fluid pack lot # [id number]  . COLONOSCOPY WITH PROPOFOL N/A 06/04/2018   Procedure: COLONOSCOPY WITH PROPOFOL;  Surgeon: Lollie Sails, MD;  Location: [facility] ENDOSCOPY;  Service: Endoscopy;  Laterality: N/A;  . DIAGNOSTIC LAPAROSCOPY    . ESOPHAGOGASTRODUODENOSCOPY N/A 07/19/2015   Procedure: ESOPHAGOGASTRODUODENOSCOPY (EGD);  Surgeon: Hulen Luster, MD;  Location: [facility] ENDOSCOPY;  Service: Gastroenterology;  Laterality: N/A;  . ESOPHAGOGASTRODUODENOSCOPY (EGD) WITH PROPOFOL N/A 03/28/2015   Procedure: ESOPHAGOGASTRODUODENOSCOPY (EGD) WITH PROPOFOL;  Surgeon: Robert Bellow, MD;  Location: [facility] ENDOSCOPY;  Service: Endoscopy;  Laterality: N/A;  . ESOPHAGOGASTRODUODENOSCOPY (EGD) WITH PROPOFOL N/A 06/10/2015   Procedure: ESOPHAGOGASTRODUODENOSCOPY (EGD) WITH PROPOFOL;  Surgeon: Lollie Sails, MD;  Location: [facility] ENDOSCOPY;  Service: Endoscopy;  Laterality: N/A;  . ESOPHAGOGASTRODUODENOSCOPY (EGD) WITH PROPOFOL N/A 10/20/2015   Procedure: ESOPHAGOGASTRODUODENOSCOPY (EGD) WITH PROPOFOL;  Surgeon: Lollie Sails, MD;  Location: [facility] ENDOSCOPY;  Service: Endoscopy;  Laterality: N/A;  . ESOPHAGOGASTRODUODENOSCOPY (EGD) WITH PROPOFOL N/A 02/23/2016   Procedure: ESOPHAGOGASTRODUODENOSCOPY (EGD) WITH PROPOFOL;  Surgeon: Lollie Sails, MD;  Location: [facility] ENDOSCOPY;  Service: Endoscopy;  Laterality: N/A;  . ESOPHAGOGASTRODUODENOSCOPY (EGD) WITH PROPOFOL N/A 06/30/2016   Procedure:  ESOPHAGOGASTRODUODENOSCOPY (EGD) WITH PROPOFOL;  Surgeon: Lollie Sails, MD;  Location: [facility] ENDOSCOPY;  Service: Endoscopy;  Laterality: N/A;  . ESOPHAGOGASTRODUODENOSCOPY (EGD) WITH PROPOFOL N/A 01/05/2017   Procedure: ESOPHAGOGASTRODUODENOSCOPY (EGD) WITH PROPOFOL;  Surgeon: Lollie Sails, MD;  Location: [facility] ENDOSCOPY;  Service: Endoscopy;  Laterality: N/A;  . KYPHOPLASTY N/A 02/10/2015   Procedure: [id number]/L0;  Surgeon: Hessie Knows, MD;  Location: [facility] ORS;  Service: Orthopedics;  Laterality: N/A;  . LAPAROTOMY N/A 03/11/2015   Procedure: REDUCTION OF GASTRIC VOLVULUS, SPLEENECTOMY, GASTRIC TUBE PLACEMENT;  Surgeon: Robert Bellow, MD;  Location: [facility] ORS;  Service: General;  Laterality: N/A;  . MOHS SURGERY    . PERIPHERAL VASCULAR CATHETERIZATION N/A 06/12/2015   Procedure: IVC Filter Insertion;  Surgeon: Algernon Huxley, MD;  Location: [place] CV LAB;  Service: Cardiovascular;  Laterality: N/A;  . PERIPHERAL VASCULAR CATHETERIZATION N/A 08/23/2016   Procedure: IVC Filter Removal;  Surgeon: Katha Cabal, MD;  Location: [place] CV LAB;  Service: Cardiovascular;  Laterality: N/A;  . STOMACH SURGERY     for volvolus     A IV Location/Drains/Wounds Patient Lines/Drains/Airways Status   Active  Line/Drains/Airways    Name:   Placement date:   Placement time:   Site:   Days:   Peripheral IV 03/10/19 Right Antecubital   03/10/19    1730    Antecubital   less than 1          Intake/Output Last 24 hours No intake or output data in the 24 hours ending 03/10/19 2123  Labs/Imaging Results for orders placed or performed during the hospital encounter of 03/10/19 (from the past 48 hour(s))  Lactic acid, plasma     Status: None   Collection Time: 03/10/19  5:46 PM  Result Value Ref Range   Lactic Acid, Venous 1.2 0.5 - 1.9 mmol/L    Comment: Performed at [facility], [address] [place], [place] [address]  Comprehensive metabolic panel     Status:  Abnormal   Collection Time: 03/10/19  5:46 PM  Result Value Ref Range   Sodium 149 (H) 135 - 145 mmol/L   Potassium 4.3 3.5 - 5.1 mmol/L   Chloride 119 (H) 98 - 111 mmol/L   CO2 15 (L) 22 - 32 mmol/L   Glucose, Bld 69 (L) 70 - 99 mg/dL   BUN 39 (H) 8 - 23 mg/dL   Creatinine, Ser 1.80 (H) 0.44 - 1.00 mg/dL   Calcium 9.0 8.9 - 10.3 mg/dL   Total Protein 6.6 6.5 - 8.1 g/dL   Albumin 2.9 (L) 3.5 - 5.0 g/dL   AST 26 15 - 41 U/L   ALT 13 0 - 44 U/L   Alkaline Phosphatase 129 (H) 38 - 126 U/L   Total Bilirubin 0.5 0.3 - 1.2 mg/dL   GFR calc non Af Amer 26 (L) >60 mL/min   GFR calc Af Amer 30 (L) >60 mL/min   Anion gap 15 5 - 15    Comment: Performed at [facility], [address] [place], [place] [address]  CBC with Differential     Status: Abnormal   Collection Time: 03/10/19  5:46 PM  Result Value Ref Range   WBC 5.8 4.0 - 10.5 K/uL   RBC 3.39 (L) 3.87 - 5.11 MIL/uL   Hemoglobin 10.5 (L) 12.0 - 15.0 g/dL   HCT 37.3 36.0 - 46.0 %   MCV 110.0 (H) 80.0 - 100.0 fL   MCH 31.0 26.0 - 34.0 pg   MCHC 28.2 (L) 30.0 - 36.0 g/dL   RDW 16.9 (H) 11.5 - 15.5 %   Platelets 275 150 - 400 K/uL   nRBC 1.9 (H) 0.0 - 0.2 %   Neutrophils Relative % 70 %   Neutro Abs 4.1 1.7 - 7.7 K/uL   Lymphocytes Relative 19 %   Lymphs Abs 1.1 0.7 - 4.0 K/uL   Monocytes Relative 7 %   Monocytes Absolute 0.4 0.1 - 1.0 K/uL   Eosinophils Relative 2 %   Eosinophils Absolute 0.1 0.0 - 0.5 K/uL   Basophils Relative 2 %   Basophils Absolute 0.1 0.0 - 0.1 K/uL   Immature Granulocytes 0 %   Abs Immature Granulocytes 0.02 0.00 - 0.07 K/uL    Comment: Performed at [facility], [address] [place],  [address]  Troponin I - ONCE - STAT     Status: Abnormal   Collection Time: 03/10/19  5:46 PM  Result Value Ref Range   Troponin I 0.06 (HH) <0.03 ng/mL    Comment: CRITICAL RESULT CALLED TO, READ BACK BY AND VERIFIED WITH ANNA HOLT AT 1822 03/10/2019.PMF Performed  at [facility], [address] [place], [place] [address]   Protime-INR     Status: Abnormal   Collection Time: 03/10/19  6:13 PM  Result Value Ref Range   Prothrombin Time 25.1 (H) 11.4 - 15.2 seconds   INR 2.3 (H) 0.8 - 1.2    Comment: (NOTE) INR goal varies based on device and disease states. Performed at [facility], [address] [place], [place] [address]   SARS Coronavirus 2 (CEPHEID - Performed in [facility] hospital lab), Hosp Order     Status: None   Collection Time: 03/10/19  7:00 PM   Specimen: Nasopharyngeal Swab  Result Value Ref Range   SARS Coronavirus 2 NEGATIVE NEGATIVE    Comment: (NOTE) If result is NEGATIVE SARS-CoV-2 target nucleic acids are NOT DETECTED. The SARS-CoV-2 RNA is generally detectable in upper and lower  respiratory specimens during the acute phase of infection. The lowest  concentration of SARS-CoV-2 viral copies this assay can detect is 250  copies / mL. A negative result does not preclude SARS-CoV-2 infection  and should not be used as the sole basis for treatment or other  patient management decisions.  A negative result may occur with  improper specimen collection / handling, submission of specimen other  than nasopharyngeal swab, presence of viral mutation(s) within the  areas targeted by this assay, and inadequate number of viral copies  (<250 copies / mL). A negative result must be combined with clinical  observations, patient history, and epidemiological information. If result is POSITIVE SARS-CoV-2 target nucleic acids are DETECTED. The SARS-CoV-2 RNA is generally detectable in upper and lower  respiratory specimens dur ing the acute phase of infection.  Positive  results are indicative of active infection with SARS-CoV-2.  Clinical  correlation with patient history and other diagnostic information is  necessary to determine patient infection status.  Positive results do  not rule out bacterial infection or co-infection with  other viruses. If result is PRESUMPTIVE POSTIVE SARS-CoV-2 nucleic acids MAY BE PRESENT.   A presumptive positive result was obtained on the submitted specimen  and confirmed on repeat testing.  While 2019 novel coronavirus  (SARS-CoV-2) nucleic acids may be present in the submitted sample  additional confirmatory testing may be necessary for epidemiological  and / or clinical management purposes  to differentiate between  SARS-CoV-2 and other Sarbecovirus currently known to infect humans.  If clinically indicated additional testing with an alternate test  methodology [phone]) is advised. The SARS-CoV-2 RNA is generally  detectable in upper and lower respiratory sp ecimens during the acute  phase of infection. The expected result is Negative. Fact Sheet for Patients:  StrictlyIdeas.no Fact Sheet for Healthcare Providers: BankingDealers.co.za This test is not yet approved or cleared by the [place] FDA and has been authorized for detection and/or diagnosis of SARS-CoV-2 by FDA under an Emergency Use Authorization (EUA).  This EUA will remain in effect (meaning this test can be used) for the duration of the COVID-19 declaration under Section 564(b)(1) of the Act, 21 U.S.C. section 360bbb-3(b)(1), unless the authorization is terminated or revoked sooner. Performed at [facility], [address] [place], [place] [address]   Lipase, blood     Status: None   Collection Time: 03/10/19  8:05 PM  Result Value Ref Range   Lipase 19 11 - 51 U/L    Comment: Performed at [facility], [address] [place], [place] [address]   Ct Abdomen Pelvis Wo  Contrast  Result Date: 03/10/2019 CLINICAL DATA:  Vomiting. EXAM: CT ABDOMEN AND PELVIS WITHOUT CONTRAST TECHNIQUE: Multidetector CT imaging of the abdomen and pelvis was performed following the standard protocol without IV contrast. COMPARISON:  07/13/2018 FINDINGS: Lower  chest: Bibasilar atelectasis. Large hiatal hernia. Calcific atherosclerotic disease of the coronary arteries. Hepatobiliary: No focal liver abnormality is seen. No gallstones, gallbladder wall thickening, or biliary dilatation. Pancreas: Atrophic pancreas. Spleen: The spleen is surgically. Adrenals/Urinary Tract: Normal adrenal glands. Bilateral renal cysts, the largest in the midpole region of the left kidney measuring 3.1 cm. Normal urinary bladder. Stomach/Bowel: Large hiatal hernia with diffuse circumferential mucosal thickening of the gastric mucosa within the hernial sac and in the anatomic location of the stomach. No evidence of small-bowel obstruction. Extensive colonic diverticulosis. Long segment of mucosal thickening of the ascending, transverse and sigmoid colon likely represent diverticulitis or other forms of colitis. Vascular/Lymphatic: Calcific atherosclerotic disease and severe tortuosity of the aorta. Reproductive: Uterus and bilateral adnexa are unremarkable. Other: No abdominal wall hernia or abnormality. No abdominopelvic ascites. Musculoskeletal: Osteopenia. Scoliosis. Prior vertebroplasty. Diffuse spondylosis of the lumbosacral spine. IMPRESSION: 1. Large hiatal hernia with diffuse circumferential mucosal thickening of the gastric mucosa within the hernial sac and in the anatomic location of the stomach. This may represent diffuse gastritis, however gastric malignancy cannot be entirely excluded. 2. Extensive colonic diverticulosis. Long segment of mucosal thickening of the ascending, transverse and sigmoid colon, likely representing diverticulitis or other form of colitis. 3. Calcific atherosclerotic disease of the aorta and marked tortuosity. Electronically Signed   By: Fidela Salisbury M.D.   On: 03/10/2019 19:12   Dg Chest Port 1 View  Result Date: 03/10/2019 CLINICAL DATA:  Vomiting. Weakness. EXAM: PORTABLE CHEST 1 VIEW COMPARISON:  03/31/2015 FINDINGS: Cardiomediastinal  silhouette is enlarged. Mediastinal contours appear intact. Tortuosity and calcific atherosclerotic disease of the aorta. Large hiatal hernia. There is no evidence of focal airspace consolidation, pleural effusion or pneumothorax. Osseous structures are without acute abnormality. Soft tissues are grossly normal. IMPRESSION: 1. Enlarged cardiac silhouette. 2. Large hiatal hernia. Electronically Signed   By: Fidela Salisbury M.D.   On: 03/10/2019 19:14    Pending Labs Unresulted Labs (From admission, onward)    Start     Ordered   03/11/19 0177  Basic metabolic panel  Tomorrow morning,   STAT     03/10/19 2103   03/11/19 0500  CBC  Tomorrow morning,   STAT     03/10/19 2103   03/11/19 0500  Protime-INR  Tomorrow morning,   STAT     03/10/19 2103   03/10/19 2105  Troponin I - Now Then Q6H  Now then every 6 hours,   STAT     03/10/19 2104   03/10/19 2103  TSH  Once,   STAT     03/10/19 2103   03/10/19 2103  Aerobic Culture (superficial specimen)  Once,   STAT    Question:  Patient immune status  Answer:  Immunocompromised   03/10/19 2103          Vitals/Pain Today's Vitals   03/10/19 1930 03/10/19 2000 03/10/19 2030 03/10/19 2100  BP: 122/70 131/71 138/74 133/76  Pulse: 96 97 96 98  Resp: 19 19 15 20   Temp:      TempSrc:      SpO2: 94% 92% 97% 93%  Weight:      Height:      PainSc:        Isolation Precautions No active isolations  Medications Medications  ciprofloxacin (CIPRO) IVPB 400 mg (has no administration in time range)  fentaNYL (DURAGESIC) 50 MCG/HR 1 patch (has no administration in time range)  letrozole [facility]) tablet 2.5 mg (has no administration in time range)  lisinopril (ZESTRIL) tablet 5 mg (has no administration in time range)  metoCLOPramide (REGLAN) tablet 10 mg (has no administration in time range)  mirtazapine (REMERON) tablet 15 mg (has no administration in time range)  levothyroxine (SYNTHROID) tablet 125 mcg (has no administration in time  range)  warfarin (COUMADIN) tablet 3 mg (has no administration in time range)  warfarin (COUMADIN) tablet 6 mg (has no administration in time range)  mometasone-formoterol (DULERA) 200-5 MCG/ACT inhaler 2 puff (has no administration in time range)  hydroxychloroquine (PLAQUENIL) tablet 200 mg (has no administration in time range)  metoprolol succinate (TOPROL-XL) 24 hr tablet 12.5 mg (has no administration in time range)  mycophenolate (CELLCEPT) capsule 1,000 mg (has no administration in time range)  pantoprazole (PROTONIX) EC tablet 40 mg (has no administration in time range)  predniSONE (DELTASONE) tablet 5 mg (has no administration in time range)  sucralfate (CARAFATE) 1 GM/10ML suspension 1 g (has no administration in time range)  tadalafil (PAH) (ADCIRCA) tablet 40 mg (has no administration in time range)  0.45 % sodium chloride infusion (has no administration in time range)  acetaminophen (TYLENOL) tablet 650 mg (has no administration in time range)    Or  acetaminophen (TYLENOL) suppository 650 mg (has no administration in time range)  ondansetron (ZOFRAN) tablet 4 mg (has no administration in time range)    Or  ondansetron (ZOFRAN) injection 4 mg (has no administration in time range)  polyethylene glycol (MIRALAX / GLYCOLAX) packet 17 g (has no administration in time range)  ciprofloxacin (CIPRO) IVPB 400 mg (has no administration in time range)  metroNIDAZOLE (FLAGYL) IVPB 500 mg (has no administration in time range)  sodium chloride flush (NS) 0.9 % injection 3 mL (3 mLs Intravenous Given 03/10/19 1815)  ondansetron (ZOFRAN) injection 4 mg (4 mg Intravenous Given 03/10/19 1816)  sodium chloride 0.9 % bolus 500 mL (0 mLs Intravenous Stopped 03/10/19 2019)  metroNIDAZOLE (FLAGYL) IVPB 500 mg (500 mg Intravenous New Bag/Given 03/10/19 2020)    Mobility walks with person assist Low fall risk   Focused Assessments GI   R Recommendations: See Admitting Provider Note  Report  given to:   Additional Notes:

## 2019-03-11 ENCOUNTER — Encounter
Admission: EM | Disposition: A | Discharge status: Home / Self Care | Payer: Self-pay | Source: Home / Self Care | Attending: Internal Medicine

## 2019-03-11 ENCOUNTER — Inpatient Hospital Stay: Payer: Medicare Other | Admitting: Registered Nurse

## 2019-03-11 ENCOUNTER — Encounter: Payer: Self-pay | Admitting: Anesthesiology

## 2019-03-11 ENCOUNTER — Other Ambulatory Visit: Payer: Self-pay

## 2019-03-11 ENCOUNTER — Inpatient Hospital Stay: Payer: Medicare Other

## 2019-03-11 DIAGNOSIS — R112 Nausea with vomiting, unspecified: Secondary | ICD-10-CM

## 2019-03-11 DIAGNOSIS — T18128A Food in esophagus causing other injury, initial encounter: Secondary | ICD-10-CM

## 2019-03-11 DIAGNOSIS — W44F3XA Food entering into or through a natural orifice, initial encounter: Secondary | ICD-10-CM

## 2019-03-11 DIAGNOSIS — R4702 Dysphasia: Secondary | ICD-10-CM

## 2019-03-11 DIAGNOSIS — K222 Esophageal obstruction: Secondary | ICD-10-CM

## 2019-03-11 HISTORY — PX: ESOPHAGOGASTRODUODENOSCOPY (EGD) WITH PROPOFOL: SHX5813

## 2019-03-11 LAB — BASIC METABOLIC PANEL
Anion gap: 8 (ref 5–15)
BUN: 35 mg/dL — ABNORMAL HIGH (ref 8–23)
CO2: 16 mmol/L — ABNORMAL LOW (ref 22–32)
Calcium: 8 mg/dL — ABNORMAL LOW (ref 8.9–10.3)
Chloride: 123 mmol/L — ABNORMAL HIGH (ref 98–111)
Creatinine, Ser: 1.74 mg/dL — ABNORMAL HIGH (ref 0.44–1.00)
GFR calc Af Amer: 31 mL/min — ABNORMAL LOW (ref >60)
GFR calc non Af Amer: 27 mL/min — ABNORMAL LOW (ref >60)
Glucose, Bld: 92 mg/dL (ref 70–99)
Potassium: 4 mmol/L (ref 3.5–5.1)
Sodium: 147 mmol/L — ABNORMAL HIGH (ref 135–145)

## 2019-03-11 LAB — TROPONIN I
Troponin I: 0.11 ng/mL (ref <0.03)
Troponin I: 0.12 ng/mL (ref <0.03)
Troponin I: 0.1 ng/mL (ref <0.03)
Troponin I: 0.11 ng/mL (ref <0.03)

## 2019-03-11 LAB — CBC
HCT: 30.6 % — ABNORMAL LOW (ref 36.0–46.0)
Hemoglobin: 8.7 g/dL — ABNORMAL LOW (ref 12.0–15.0)
MCH: 30.9 pg (ref 26.0–34.0)
MCHC: 28.4 g/dL — ABNORMAL LOW (ref 30.0–36.0)
MCV: 108.5 fL — ABNORMAL HIGH (ref 80.0–100.0)
Platelets: 312 10*3/uL (ref 150–400)
RBC: 2.82 MIL/uL — ABNORMAL LOW (ref 3.87–5.11)
RDW: 16.7 % — ABNORMAL HIGH (ref 11.5–15.5)
WBC: 6.6 10*3/uL (ref 4.0–10.5)
nRBC: 1.8 % — ABNORMAL HIGH (ref 0.0–0.2)

## 2019-03-11 LAB — GLUCOSE, CAPILLARY
Glucose-Capillary: 128 mg/dL — ABNORMAL HIGH (ref 70–99)
Glucose-Capillary: 39 mg/dL — CL (ref 70–99)
Glucose-Capillary: 63 mg/dL — ABNORMAL LOW (ref 70–99)
Glucose-Capillary: 70 mg/dL (ref 70–99)
Glucose-Capillary: 71 mg/dL (ref 70–99)
Glucose-Capillary: 71 mg/dL (ref 70–99)
Glucose-Capillary: 74 mg/dL (ref 70–99)
Glucose-Capillary: 92 mg/dL (ref 70–99)
Glucose-Capillary: 93 mg/dL (ref 70–99)

## 2019-03-11 LAB — PROTIME-INR
INR: 2.6 — ABNORMAL HIGH (ref 0.8–1.2)
Prothrombin Time: 27.8 seconds — ABNORMAL HIGH (ref 11.4–15.2)

## 2019-03-11 SURGERY — ESOPHAGOGASTRODUODENOSCOPY (EGD) WITH PROPOFOL
Anesthesia: General

## 2019-03-11 MED ORDER — MUPIROCIN 2 % EX OINT
TOPICAL_OINTMENT | Freq: Every day | CUTANEOUS | Status: DC
  Administered 2019-03-11 – 2019-03-13 (×2): via NASAL
  Filled 2019-03-11: qty 22

## 2019-03-11 MED ORDER — METRONIDAZOLE IN NACL 5-0.79 MG/ML-% IV SOLN
500.0000 mg | Freq: Three times a day (TID) | INTRAVENOUS | Status: DC
  Administered 2019-03-11 – 2019-03-12 (×3): 500 mg via INTRAVENOUS
  Filled 2019-03-11 (×5): qty 100

## 2019-03-11 MED ORDER — SODIUM CHLORIDE 0.9 % IV SOLN: INTRAVENOUS | Status: DC

## 2019-03-11 MED ORDER — WARFARIN - PHARMACIST DOSING INPATIENT: Freq: Every day | Status: DC

## 2019-03-11 MED ORDER — DEXTROSE 50 % IV SOLN
25.0000 mL | Freq: Once | INTRAVENOUS | Status: AC
  Administered 2019-03-11: 25 mL via INTRAVENOUS

## 2019-03-11 MED ORDER — PROPOFOL 500 MG/50ML IV EMUL
INTRAVENOUS | Status: DC | PRN
  Administered 2019-03-11: 100 ug/kg/min via INTRAVENOUS

## 2019-03-11 MED ORDER — DEXTROSE-NACL 5-0.45 % IV SOLN
INTRAVENOUS | Status: DC
  Administered 2019-03-11 – 2019-03-12 (×3): via INTRAVENOUS

## 2019-03-11 MED ORDER — PROPOFOL 10 MG/ML IV BOLUS
INTRAVENOUS | Status: DC | PRN
  Administered 2019-03-11: 50 mg via INTRAVENOUS

## 2019-03-11 MED ORDER — WARFARIN SODIUM 2 MG PO TABS
2.0000 mg | ORAL_TABLET | Freq: Once | ORAL | Status: AC
  Filled 2019-03-11: qty 1

## 2019-03-11 MED ORDER — DEXTROSE 50 % IV SOLN
INTRAVENOUS | Status: AC
  Filled 2019-03-11: qty 50

## 2019-03-11 MED ORDER — LEVOTHYROXINE SODIUM 112 MCG PO TABS
112.0000 ug | ORAL_TABLET | Freq: Every day | ORAL | Status: DC
  Administered 2019-03-12 – 2019-03-13 (×2): 112 ug via ORAL
  Filled 2019-03-11 (×2): qty 1

## 2019-03-11 MED ORDER — TADALAFIL 20 MG TABLET (PULMONARY HYPERTENSION)
ORAL_TABLET | Freq: Every day | ORAL | 11 refills | 0 days | Status: CP
Start: 2019-03-11 — End: 2019-04-10

## 2019-03-11 NOTE — Plan of Care (Signed)
Patient continues to have nausea and poor intake. Patient struggles to take PO medications, fluids and or soft foods. Tried to encourage some intake, but patient was only able to take in a few small bites of applesauce with her crushed meds.   Fuller Mandril, RN

## 2019-03-11 NOTE — Progress Notes (Signed)
SLP Cancellation Note  Patient Details Name: Diana [name] MRN: [id number] DOB: [birth year]   Cancelled treatment:       Reason Eval/Treat Not Completed: Patient at procedure or test/unavailable(chart reviewed; pt is out of room. ST will return this PM.)    Orinda Kenner, MS, CCC-SLP [facility] 03/11/2019, 9:13 AM

## 2019-03-11 NOTE — Consult Note (Signed)
[name], MD [facility]  [address], [place], [place] [address] Phone: [phone] Fax : [phone]  Consultation  Referring Provider:     Dr. Brett Albino Primary Care Physician:  Crecencio Mc, MD Primary Gastroenterologist:  Dr. Gustavo Lah         Reason for Consultation:     Dysphasia  Date of Admission:  03/10/2019 Date of Consultation:  03/11/2019         HPI:   Diana [name] is a 84 y.o. female who was admitted with what appears to be diverticulitis.  The patient has had an extensive work-up by Dr. Gustavo Lah for dysphasia and nausea.  The patient has had no less than 7 upper endoscopies and a colonoscopy that was done in September 2019.  The patient upper endoscopy in 2018 showed a hiatal hernia with gastritis and Cameron ulcers.  The previous EGD on October 2017 showed esophagitis. The patient reports that she was told that she was not in any condition to go for any further endoscopies or evaluation for her dysphasia.  Patient had a CT scan done on admission for vomiting that showed:  IMPRESSION: 1. Large hiatal hernia with diffuse circumferential mucosal thickening of the gastric mucosa within the hernial sac and in the anatomic location of the stomach. This may represent diffuse gastritis, however gastric malignancy cannot be entirely excluded. 2. Extensive colonic diverticulosis. Long segment of mucosal thickening of the ascending, transverse and sigmoid colon, likely representing diverticulitis or other form of colitis. 3. Calcific atherosclerotic disease of the aorta and marked Tortuosity.  The patient reports that she had been doing well until yesterday when she started to have problems swallowing again.  She also reported that she was having nausea and vomiting on admission that started earlier that day.  The patient also had been diagnosed and treated for H. pylori and has a history of CMV esophagitis.  The patient is on immunosuppression for her lupus.  Been and  was admitted with an INR of 2.3 with a repeat INR of 2.6 this morning.  The patient's hemoglobin on admission yesterday was 10.5 with this morning's hemoglobin of 8.7.  The patient's baseline hemoglobin from October 2019 until this admission has ranged from 7.9 to 9.3.  Past Medical History:  Diagnosis Date   Acute glomerulonephritis with other specified pathological lesion in kidney in disease classified elsewhere(580.81)    Alveolar aeration decreased    Anemia    Arthritis    Atrial fibrillation ([place])    Breast cancer ([place]) 06/03/2014   positive, radiation   Cancer ([place])    Breast   CHF (congestive heart failure) (HCC)    Diaphragmatic hernia    DVT (deep venous thrombosis) ([place])    Dysrhythmia    Environmental allergies    Erosive esophagitis 03/28/2015   esophageal biopsy July 2017 c/w CMV cytopathic changes, negative for H Pylori and dysplasia      Esophageal reflux    Esophagitis, CMV ([place]) 05/26/2015   Diagnosed at [facility] with manometry,  Tube feeds started for enteral nutrtion 05/12/15    Feeding problem    FEEDING TUBE   Hiatal hernia    Hyperlipidemia    Hypertension    Lower extremity edema    Lupus (systemic lupus erythematosus) (HCC)    Neuropathy    Osteopenia    Oxygen deficiency    USES HS   Peripheral neuropathy, hereditary/idiopathic    Personal history of radiation therapy    Pneumonia  Primary pulmonary hypertension ([place])    Pulmonary hypertension (HCC)    Renal insufficiency    Sciatica    Shingles    Shingles (herpes zoster) polyneuropathy March 2013   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   Shortness of breath dyspnea    Unspecified hypothyroidism    Unspecified menopausal and postmenopausal disorder    Urinary incontinence without sensory awareness    Vitamin B deficiency     Past Surgical History:  Procedure Laterality Date   BREAST BIOPSY Left 2015   invasive mammary carcinoma   BREAST LUMPECTOMY Left 2015     invasive mammary carcinoma clear margins but close   BREAST SURGERY     CATARACT EXTRACTION W/PHACO Right 04/05/2016   Procedure: CATARACT EXTRACTION PHACO AND INTRAOCULAR LENS PLACEMENT ([place]);  Surgeon: Birder Robson, MD;  Location: [facility] ORS;  Service: Ophthalmology;  Laterality: Right;  [place] 01:20 AP% 23.9 CDE 19.29 fluid pack lot # [id number]   CATARACT EXTRACTION W/PHACO Left 04/26/2016   Procedure: CATARACT EXTRACTION PHACO AND INTRAOCULAR LENS PLACEMENT ([facility]);  Surgeon: Birder Robson, MD;  Location: [facility] ORS;  Service: Ophthalmology;  Laterality: Left;   [place] 00:59 AP% 23.2 CDE 13.83 Fluid pack lot # [id number]   COLONOSCOPY WITH PROPOFOL N/A 06/04/2018   Procedure: COLONOSCOPY WITH PROPOFOL;  Surgeon: Lollie Sails, MD;  Location[facility] ENDOSCOPY;  Service: Endoscopy;  Laterality: N/A;   DIAGNOSTIC LAPAROSCOPY     ESOPHAGOGASTRODUODENOSCOPY N/A 07/19/2015   Procedure: ESOPHAGOGASTRODUODENOSCOPY (EGD);  Surgeon: Hulen Luster, MD;  Location: [facility] ENDOSCOPY;  Service: Gastroenterology;  Laterality: N/A;   ESOPHAGOGASTRODUODENOSCOPY (EGD) WITH PROPOFOL N/A 03/28/2015   Procedure: ESOPHAGOGASTRODUODENOSCOPY (EGD) WITH PROPOFOL;  Surgeon: Robert Bellow, MD;  Location: [facility] ENDOSCOPY;  Service: Endoscopy;  Laterality: N/A;   ESOPHAGOGASTRODUODENOSCOPY (EGD) WITH PROPOFOL N/A 06/10/2015   Procedure: ESOPHAGOGASTRODUODENOSCOPY (EGD) WITH PROPOFOL;  Surgeon: Lollie Sails, MD;  Location: [facility] ENDOSCOPY;  Service: Endoscopy;  Laterality: N/A;   ESOPHAGOGASTRODUODENOSCOPY (EGD) WITH PROPOFOL N/A 10/20/2015   Procedure: ESOPHAGOGASTRODUODENOSCOPY (EGD) WITH PROPOFOL;  Surgeon: Lollie Sails, MD;  Location[facility] ENDOSCOPY;  Service: Endoscopy;  Laterality: N/A;   ESOPHAGOGASTRODUODENOSCOPY (EGD) WITH PROPOFOL N/A 02/23/2016   Procedure: ESOPHAGOGASTRODUODENOSCOPY (EGD) WITH PROPOFOL;  Surgeon: Lollie Sails, MD;  Location[facility] ENDOSCOPY;  Service: Endoscopy;  Laterality: N/A;    ESOPHAGOGASTRODUODENOSCOPY (EGD) WITH PROPOFOL N/A 06/30/2016   Procedure: ESOPHAGOGASTRODUODENOSCOPY (EGD) WITH PROPOFOL;  Surgeon: Lollie Sails, MD;  Location: [facility] ENDOSCOPY;  Service: Endoscopy;  Laterality: N/A;   ESOPHAGOGASTRODUODENOSCOPY (EGD) WITH PROPOFOL N/A 01/05/2017   Procedure: ESOPHAGOGASTRODUODENOSCOPY (EGD) WITH PROPOFOL;  Surgeon: Lollie Sails, MD;  Location: [facility] ENDOSCOPY;  Service: Endoscopy;  Laterality: N/A;   KYPHOPLASTY N/A 02/10/2015   Procedure: [id number];  Surgeon: Hessie Knows, MD;  Location: [facility] ORS;  Service: Orthopedics;  Laterality: N/A;   LAPAROTOMY N/A 03/11/2015   Procedure: REDUCTION OF GASTRIC VOLVULUS, SPLEENECTOMY, GASTRIC TUBE PLACEMENT;  Surgeon: Robert Bellow, MD;  Location: [facility] ORS;  Service: General;  Laterality: N/A;   MOHS SURGERY     PERIPHERAL VASCULAR CATHETERIZATION N/A 06/12/2015   Procedure: IVC Filter Insertion;  Surgeon: Algernon Huxley, MD;  Location: [place] CV LAB;  Service: Cardiovascular;  Laterality: N/A;   PERIPHERAL VASCULAR CATHETERIZATION N/A 08/23/2016   Procedure: IVC Filter Removal;  Surgeon: Katha Cabal, MD;  Location: [place] CV LAB;  Service: Cardiovascular;  Laterality: N/A;   STOMACH SURGERY     for volvolus    Prior to Admission medications   Medication Sig  Start Date End Date Taking? Authorizing Provider  Ascorbic Acid (VITAMIN C) 1000 MG tablet Take 1,000 mg by mouth daily.   Yes [provider]  Calcium Carbonate-Vitamin D 600-400 MG-UNIT tablet Take 1 tablet by mouth daily.   Yes [provider]  cyanocobalamin (,VITAMIN B-12,) 1000 MCG/ML injection Inject 1 mL (1,000 mcg total) into the muscle every 30 (thirty) days. Pt uses on the 1st of every month. 12/28/16  Yes Crecencio Mc, MD  doxycycline (VIBRA-TABS) 100 MG tablet Take 1 tablet (100 mg total) by mouth 2 (two) times daily. 03/07/19  Yes Guse, Jacquelynn Cree, FNP  fentaNYL (DURAGESIC) 50 MCG/HR Place 1 patch  onto the skin every 3 (three) days. 02/19/19  Yes Crecencio Mc, MD  hydroxychloroquine (PLAQUENIL) 200 MG tablet Take 200 mg by mouth daily.   Yes [provider]  letrozole (FEMARA) 2.5 MG tablet TAKE 1 TABLET BY MOUTH EVERY DAY 02/23/19  Yes Sindy Guadeloupe, MD  lisinopril (PRINIVIL,ZESTRIL) 5 MG tablet TAKE 1 TABLET (5 MG TOTAL) BY MOUTH DAILY. 05/23/18  Yes Crecencio Mc, MD  metoCLOPramide (REGLAN) 10 MG tablet TAKE 1 TABLET BY MOUTH 4 TIMES DAILY - BEFORE MEALS AND AT BEDTIME. 06/29/18  Yes Crecencio Mc, MD  metoprolol succinate (TOPROL-XL) 25 MG 24 hr tablet Take 12.5 mg by mouth daily.    Yes [provider]  mupirocin ointment (BACTROBAN) 2 % Apply 1 application topically 2 (two) times daily. Apply thin layer to affected area on leg 03/07/19  Yes Guse, Jacquelynn Cree, FNP  mycophenolate (CELLCEPT) 500 MG tablet Take 1,000 mg by mouth 2 (two) times daily.   Yes [provider]  ondansetron (ZOFRAN ODT) 4 MG disintegrating tablet Take 1 tablet (4 mg total) by mouth every 8 (eight) hours as needed for nausea or vomiting. 11/29/17  Yes Harvest Dark, MD  oxyCODONE-acetaminophen (PERCOCET/ROXICET) 5-325 MG tablet Take 1 tablet by mouth 2 (two) times daily as needed for severe pain. 03/02/19  Yes Crecencio Mc, MD  pantoprazole (PROTONIX) 40 MG tablet Take 40 mg by mouth 2 (two) times daily.   Yes [provider]  predniSONE (DELTASONE) 5 MG tablet Take 5 mg by mouth daily.    Yes [provider]  prochlorperazine (COMPAZINE) 5 MG tablet Take 1 tablet (5 mg total) by mouth every 6 (six) hours as needed for nausea or vomiting. 01/10/19  Yes Crecencio Mc, MD  Saccharomyces boulardii (PROBIOTIC) 250 MG CAPS Take 1 tablet by mouth daily. 12/02/17  Yes Mody, Ulice Bold, MD  SYNTHROID 125 MCG tablet Take 1 tablet (125 mcg total) by mouth daily. 02/08/19  Yes Crecencio Mc, MD  Tadalafil, PAH, (ADCIRCA) 20 MG TABS Take 40 mg by mouth daily.   Yes [provider]  warfarin (COUMADIN) 3 MG tablet Take 1 tablet (3 mg total) by mouth daily. Patient taking differently: Take 3 mg by mouth daily. Does not take on Saturday or Tuesday 01/10/19  Yes Crecencio Mc, MD  ALPRAZolam Duanne Moron) 0.25 MG tablet Take 1 tablet (0.25 mg total) by mouth 2 (two) times daily as needed for anxiety. Patient not taking: Reported on 03/10/2019 08/09/18   Crecencio Mc, MD  budesonide-formoterol [facility]) 160-4.5 MCG/ACT inhaler Symbicort 160 mcg-4.5 mcg/actuation HFA aerosol inhaler    [provider]  Lactulose 20 GM/30ML SOLN 30 ml every 4 hours until constipation is relieved Patient not taking: Reported on 03/10/2019 12/12/18   Crecencio Mc, MD  mirtazapine (REMERON)  15 MG tablet TAKE 1/2 TO 1 TABLET BY MOUTH NIGHTLY AT BEDTIME Patient not taking: Reported on 03/10/2019 02/06/19   Crecencio Mc, MD  OXYGEN Inhale 2 mLs into the lungs at bedtime.    [provider]  Polyvinyl Alcohol-Povidone (REFRESH OP) Place 2 drops into both eyes daily as needed (dry eyes).     [provider]  sucralfate (CARAFATE) 1 GM/10ML suspension Take 1 g by mouth 4 (four) times daily. Before meals and nightly    [provider]  Syringe/Needle, Disp, (SYRINGE 3CC/25GX1") 25G X 1" 3 ML MISC Use for b12 injections 12/28/16   Crecencio Mc, MD  warfarin (COUMADIN) 6 MG tablet Take 1 tablet (6 mg total) by mouth daily. As directed.  PATIENT NEEDS TODAY Patient not taking: Reported on 03/10/2019 01/10/19   Crecencio Mc, MD    Family History  Problem Relation Age of Onset   Colon cancer Mother    Cirrhosis Brother        alcoholic   Alcohol abuse Sister    Breast cancer Sister 17   Cancer Brother    Lung cancer Son    Meniere's disease Son    Cirrhosis Brother        nonalcoholic      Social History   Tobacco Use   Smoking status: Former Smoker    Quit date: 05/25/1991    Years since quitting: 27.8   Smokeless tobacco: Never  Used  Substance Use Topics   Alcohol use: No    Frequency: Never    Comment: rare   Drug use: No    Allergies as of 03/10/2019 - Review Complete 03/10/2019  Allergen Reaction Noted   Iodinated diagnostic agents Hives 01/01/2013   Tramadol Other (See Comments) and Rash 01/01/2013   Amoxicillin Other (See Comments)    Cefuroxime Itching    Metrizamide Hives 08/03/2015   Morphine and related  07/17/2015   Propoxyphene  08/03/2015   Cephalexin Rash    Sulfa antibiotics Rash 01/01/2013   Sulfonamide derivatives Rash    Tramadol hcl Rash    Venlafaxine Rash     Review of Systems:    All systems reviewed and negative except where noted in HPI.   Physical Exam:  Vital signs in last 24 hours: Temp:  [98.5 F (36.9 C)-99.6 F (37.6 C)] 99.6 F (37.6 C) (06/22 1133) Pulse Rate:  [89-110] 89 (06/22 1133) Resp:  [13-20] 16 (06/22 1133) BP: (110-138)/(59-100) 111/59 (06/22 1133) SpO2:  [92 %-99 %] 97 % (06/22 1133) Weight:  [47.2 kg-47.4 kg] 47.4 kg (06/21 2203) Last BM Date: 03/10/19 General:   Pleasant, cooperative in NAD Head:  Normocephalic and atraumatic. Eyes:   No icterus.   Conjunctiva pink. PERRLA. Ears:  Normal auditory acuity. Neck:  Supple; no masses or thyroidomegaly Lungs: Respirations even and unlabored. Lungs clear to auscultation bilaterally.   No wheezes, crackles, or rhonchi.  Heart:  Regular rate and rhythm;  Without murmur, clicks, rubs or gallops Abdomen:  Soft, nondistended, nontender. Normal bowel sounds. No appreciable masses or hepatomegaly.  No rebound or guarding.  Rectal:  Not performed. Msk:  Symmetrical without gross deformities.   Extremities:  Without edema, cyanosis or clubbing. Neurologic:  Alert and oriented x3;  grossly normal neurologically. Skin:  Intact without significant lesions or rashes. Cervical Nodes:  No significant cervical adenopathy. Psych:  Alert and cooperative. Normal affect.  LAB RESULTS: Recent Labs     03/10/19 1746 03/11/19 0450  WBC  5.8 6.6  HGB 10.5* 8.7*  HCT 37.3 30.6*  PLT 275 312   BMET Recent Labs    03/10/19 1746 03/11/19 0450  NA 149* 147*  K 4.3 4.0  CL 119* 123*  CO2 15* 16*  GLUCOSE 69* 92  BUN 39* 35*  CREATININE 1.80* 1.74*  CALCIUM 9.0 8.0*   LFT Recent Labs    03/10/19 1746  PROT 6.6  ALBUMIN 2.9*  AST 26  ALT 13  ALKPHOS 129*  BILITOT 0.5   PT/INR Recent Labs    03/10/19 1813 03/11/19 0450  LABPROT 25.1* 27.8*  INR 2.3* 2.6*    STUDIES: Ct Abdomen Pelvis Wo Contrast  Result Date: 03/10/2019 CLINICAL DATA:  Vomiting. EXAM: CT ABDOMEN AND PELVIS WITHOUT CONTRAST TECHNIQUE: Multidetector CT imaging of the abdomen and pelvis was performed following the standard protocol without IV contrast. COMPARISON:  07/13/2018 FINDINGS: Lower chest: Bibasilar atelectasis. Large hiatal hernia. Calcific atherosclerotic disease of the coronary arteries. Hepatobiliary: No focal liver abnormality is seen. No gallstones, gallbladder wall thickening, or biliary dilatation. Pancreas: Atrophic pancreas. Spleen: The spleen is surgically. Adrenals/Urinary Tract: Normal adrenal glands. Bilateral renal cysts, the largest in the midpole region of the left kidney measuring 3.1 cm. Normal urinary bladder. Stomach/Bowel: Large hiatal hernia with diffuse circumferential mucosal thickening of the gastric mucosa within the hernial sac and in the anatomic location of the stomach. No evidence of small-bowel obstruction. Extensive colonic diverticulosis. Long segment of mucosal thickening of the ascending, transverse and sigmoid colon likely represent diverticulitis or other forms of colitis. Vascular/Lymphatic: Calcific atherosclerotic disease and severe tortuosity of the aorta. Reproductive: Uterus and bilateral adnexa are unremarkable. Other: No abdominal wall hernia or abnormality. No abdominopelvic ascites. Musculoskeletal: Osteopenia. Scoliosis. Prior vertebroplasty. Diffuse  spondylosis of the lumbosacral spine. IMPRESSION: 1. Large hiatal hernia with diffuse circumferential mucosal thickening of the gastric mucosa within the hernial sac and in the anatomic location of the stomach. This may represent diffuse gastritis, however gastric malignancy cannot be entirely excluded. 2. Extensive colonic diverticulosis. Long segment of mucosal thickening of the ascending, transverse and sigmoid colon, likely representing diverticulitis or other form of colitis. 3. Calcific atherosclerotic disease of the aorta and marked tortuosity. Electronically Signed   By: Fidela Salisbury M.D.   On: 03/10/2019 19:12   US Venous Img Lower Unilateral Left  Result Date: 03/11/2019 CLINICAL DATA:  Left lower extremity edema. History of prior lower extremity DVT. EXAM: LEFT LOWER EXTREMITY VENOUS DOPPLER ULTRASOUND TECHNIQUE: Gray-scale sonography with graded compression, as well as color Doppler and duplex ultrasound were performed to evaluate the lower extremity deep venous systems from the level of the common femoral vein and including the common femoral, femoral, profunda femoral, popliteal and calf veins including the posterior tibial, peroneal and gastrocnemius veins when visible. The superficial great saphenous vein was also interrogated. Spectral Doppler was utilized to evaluate flow at rest and with distal augmentation maneuvers in the common femoral, femoral and popliteal veins. COMPARISON:  06/11/2015 FINDINGS: Contralateral Common Femoral Vein: Respiratory phasicity is normal and symmetric with the symptomatic side. No evidence of thrombus. Normal compressibility. Common Femoral Vein: No evidence of thrombus. Normal compressibility, respiratory phasicity and response to augmentation. Saphenofemoral Junction: No evidence of thrombus. Normal compressibility and flow on color Doppler imaging. Profunda Femoral Vein: No evidence of thrombus. Normal compressibility and flow on color Doppler imaging.  Femoral Vein: No evidence of thrombus. Normal compressibility, respiratory phasicity and response to augmentation. Popliteal Vein: No evidence of thrombus. Normal compressibility, respiratory phasicity  and response to augmentation. Calf Veins: No evidence of thrombus. Normal compressibility and flow on color Doppler imaging. Superficial Great Saphenous Vein: No evidence of thrombus. Normal compressibility. Venous Reflux:  None. Other Findings: No evidence of superficial thrombophlebitis or abnormal fluid collection. IMPRESSION: No evidence of left lower extremity deep venous thrombosis. Electronically Signed   By: Aletta Edouard M.D.   On: 03/11/2019 09:40   Dg Chest Port 1 View  Result Date: 03/10/2019 CLINICAL DATA:  Vomiting. Weakness. EXAM: PORTABLE CHEST 1 VIEW COMPARISON:  03/31/2015 FINDINGS: Cardiomediastinal silhouette is enlarged. Mediastinal contours appear intact. Tortuosity and calcific atherosclerotic disease of the aorta. Large hiatal hernia. There is no evidence of focal airspace consolidation, pleural effusion or pneumothorax. Osseous structures are without acute abnormality. Soft tissues are grossly normal. IMPRESSION: 1. Enlarged cardiac silhouette. 2. Large hiatal hernia. Electronically Signed   By: Fidela Salisbury M.D.   On: 03/10/2019 19:14      Impression / Plan:   Assessment: Active Problems:   Diverticulitis   Diana [name] is a 84 y.o. y/o female with diverticulitis who is being treated with antibiotics.  The patient's CT scan also showed thickening of the esophagus consistent with esophagitis and a large hiatal hernia.  The patient has had increasing dysphagia.  Despite the previous 8 endoscopy she still has problems with swallowing.  There was some suggestion that she had decreased GI motility.  Plan:  The patient will continue treatment for her diverticulitis with antibiotics and I will leave that to her hospitalist team.  The patient will have an upper  endoscopy done today to look for the source of her dysphasia and thickening seen on CT scan.  There will be no biopsies done because the patient's INR is elevated since she only recently stopped her anticoagulation.  The patient's MCV on admission was 110 and other reasons for the patient's anemia should be sought occluding anemia of chronic disease versus a macrocytic anemia.  The patient iron and ferritin were normal back in January of this year.  The patient has been explained the plan and agrees with it.  Thank you for involving me in the care of this patient.      LOS: 1 day   [name], MD  03/11/2019, 11:55 AM    Note: This dictation was prepared with Dragon dictation along with smaller phrase technology. Any transcriptional errors that result from this process are unintentional.

## 2019-03-11 NOTE — Consult Note (Signed)
[facility] Nurse wound consult note Reason for Consult: Trauma wound to left anterior lower leg.  Chronic nonhealing.  Bumped her leg on furniture.  HAs been using neosporin at home.  Wound type:trauma Pressure Injury POA: NA Measurement:2.4 cm x 2 cm x scabbed lesion Wound YTR:[id number] Drainage (amount, consistency, odor) none noted Periwound:erythema and ecchymosis.  Paper thin skin Dressing procedure/placement/frequency:  Cleanse left leg wound with NS and pat dry.  Apply mupirocin ointment.  Cover with dry dressing.  Change daily.   Will not follow at this time.  Please re-consult if needed.  Domenic Moras MSN, RN, FNP-BC CWON Wound, Ostomy, Continence Nurse Pager [phone]

## 2019-03-11 NOTE — Anesthesia Preprocedure Evaluation (Signed)
Anesthesia Evaluation  Patient identified by MRN, date of birth, ID band Patient awake    Reviewed: Allergy & Precautions, NPO status , Patient's Chart, lab work & pertinent test results, reviewed documented beta blocker date and time   Airway Mallampati: II  TM Distance: >3 FB     Dental  (+) Chipped   Pulmonary shortness of breath, pneumonia, resolved, former smoker,           Cardiovascular hypertension, Pt. on medications +CHF  + dysrhythmias Atrial Fibrillation      Neuro/Psych  Neuromuscular disease    GI/Hepatic hiatal hernia, PUD, GERD  ,  Endo/Other  diabetesHypothyroidism   Renal/GU Renal disease     Musculoskeletal  (+) Arthritis ,   Abdominal   Peds  Hematology  (+) anemia ,   Anesthesia Other Findings Lupus. duragesic patch.  Hb 8.7. O2 at nite.  Reproductive/Obstetrics                             Anesthesia Physical Anesthesia Plan  ASA: III  Anesthesia Plan: General   Post-op Pain Management:    Induction: Intravenous  PONV Risk Score and Plan:   Airway Management Planned:   Additional Equipment:   Intra-op Plan:   Post-operative Plan:   Informed Consent: I have reviewed the patients History and Physical, chart, labs and discussed the procedure including the risks, benefits and alternatives for the proposed anesthesia with the patient or authorized representative who has indicated his/her understanding and acceptance.       Plan Discussed with: CRNA  Anesthesia Plan Comments:         Anesthesia Quick Evaluation

## 2019-03-11 NOTE — Progress Notes (Signed)
Per Dr. Brett Albino hold coumadin for now pending possible additional I intervention tomorrow.   Fuller Mandril, RN

## 2019-03-11 NOTE — Evaluation (Signed)
Clinical/Bedside Swallow Evaluation Patient Details  Name: Diana [name] MRN: [id number] Date of Birth: 25-Jan-1935  Today's Date: 03/11/2019 Time: SLP Start Time (ACUTE ONLY): 1040 SLP Stop Time (ACUTE ONLY): 1120 SLP Time Calculation (min) (ACUTE ONLY): 40 min  Past Medical History:  Past Medical History:  Diagnosis Date  . Acute glomerulonephritis with other specified pathological lesion in kidney in disease classified elsewhere(580.81)   . Alveolar aeration decreased   . Anemia   . Arthritis   . Atrial fibrillation (HCC)   . Breast cancer (HCC) 2020-12-1913   positive, radiation  . Cancer (HCC)    Breast  . CHF (congestive heart failure) (HCC)   . Diaphragmatic hernia   . DVT (deep venous thrombosis) (HCC)   . Dysrhythmia   . Environmental allergies   . Erosive esophagitis 03/28/2015   esophageal biopsy July 2017 c/w CMV cytopathic changes, negative for H Pylori and dysplasia     . Esophageal reflux   . Esophagitis, CMV (HCC) 05/26/2015   Diagnosed at [facility] with manometry,  Tube feeds started for enteral nutrtion 05/12/15   . Feeding problem    FEEDING TUBE  . Hiatal hernia   . Hyperlipidemia   . Hypertension   . Lower extremity edema   . Lupus (systemic lupus erythematosus) (HCC)   . Neuropathy   . Osteopenia   . Oxygen deficiency    USES HS  . Peripheral neuropathy, hereditary/idiopathic   . Personal history of radiation therapy   . Pneumonia   . Primary pulmonary hypertension (HCC)   . Pulmonary hypertension (HCC)   . Renal insufficiency   . Sciatica   . Shingles   . Shingles (herpes zoster) polyneuropathy March 2013   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  . Shortness of breath dyspnea   . Unspecified hypothyroidism   . Unspecified menopausal and postmenopausal disorder   . Urinary incontinence without sensory awareness   . Vitamin B deficiency    Past Surgical History:  Past Surgical History:  Procedure Laterality Date  . BREAST BIOPSY Left 2015   invasive  mammary carcinoma  . BREAST LUMPECTOMY Left 2015   invasive mammary carcinoma clear margins but close  . BREAST SURGERY    . CATARACT EXTRACTION W/PHACO Right 04/05/2016   Procedure: CATARACT EXTRACTION PHACO AND INTRAOCULAR LENS PLACEMENT (IOC);  Surgeon: Galen Manila, MD;  Location: [facility] ORS;  Service: Ophthalmology;  Laterality: Right;  [place] 01:20 AP% 23.9 CDE 19.29 fluid pack lot # [id number]  . CATARACT EXTRACTION W/PHACO Left 04/26/2016   Procedure: CATARACT EXTRACTION PHACO AND INTRAOCULAR LENS PLACEMENT (IOC);  Surgeon: Galen Manila, MD;  Location: [facility] ORS;  Service: Ophthalmology;  Laterality: Left;   [place] 00:59 AP% 23.2 CDE 13.83 Fluid pack lot # [id number]  . COLONOSCOPY WITH PROPOFOL N/A 06/04/2018   Procedure: COLONOSCOPY WITH PROPOFOL;  Surgeon: Christena Deem, MD;  Location: [facility] ENDOSCOPY;  Service: Endoscopy;  Laterality: N/A;  . DIAGNOSTIC LAPAROSCOPY    . ESOPHAGOGASTRODUODENOSCOPY N/A 07/19/2015   Procedure: ESOPHAGOGASTRODUODENOSCOPY (EGD);  Surgeon: Wallace Cullens, MD;  Location: [facility] ENDOSCOPY;  Service: Gastroenterology;  Laterality: N/A;  . ESOPHAGOGASTRODUODENOSCOPY (EGD) WITH PROPOFOL N/A 03/28/2015   Procedure: ESOPHAGOGASTRODUODENOSCOPY (EGD) WITH PROPOFOL;  Surgeon: Earline Mayotte, MD;  Location: [facility] ENDOSCOPY;  Service: Endoscopy;  Laterality: N/A;  . ESOPHAGOGASTRODUODENOSCOPY (EGD) WITH PROPOFOL N/A 06/10/2015   Procedure: ESOPHAGOGASTRODUODENOSCOPY (EGD) WITH PROPOFOL;  Surgeon: Christena Deem, MD;  Location: [facility] ENDOSCOPY;  Service: Endoscopy;  Laterality: N/A;  . ESOPHAGOGASTRODUODENOSCOPY (EGD) WITH PROPOFOL N/A 10/20/2015  Procedure: ESOPHAGOGASTRODUODENOSCOPY (EGD) WITH PROPOFOL;  Surgeon: Christena Deem, MD;  Location: [facility] ENDOSCOPY;  Service: Endoscopy;  Laterality: N/A;  . ESOPHAGOGASTRODUODENOSCOPY (EGD) WITH PROPOFOL N/A 02/23/2016   Procedure: ESOPHAGOGASTRODUODENOSCOPY (EGD) WITH PROPOFOL;  Surgeon: Christena Deem, MD;  Location: [facility]  ENDOSCOPY;  Service: Endoscopy;  Laterality: N/A;  . ESOPHAGOGASTRODUODENOSCOPY (EGD) WITH PROPOFOL N/A 06/30/2016   Procedure: ESOPHAGOGASTRODUODENOSCOPY (EGD) WITH PROPOFOL;  Surgeon: Christena Deem, MD;  Location: [facility] ENDOSCOPY;  Service: Endoscopy;  Laterality: N/A;  . ESOPHAGOGASTRODUODENOSCOPY (EGD) WITH PROPOFOL N/A 01/05/2017   Procedure: ESOPHAGOGASTRODUODENOSCOPY (EGD) WITH PROPOFOL;  Surgeon: Christena Deem, MD;  Location: [facility] ENDOSCOPY;  Service: Endoscopy;  Laterality: N/A;  . KYPHOPLASTY N/A 02/10/2015   Procedure: [id number];  Surgeon: Kennedy Bucker, MD;  Location: [facility] ORS;  Service: Orthopedics;  Laterality: N/A;  . LAPAROTOMY N/A 03/11/2015   Procedure: REDUCTION OF GASTRIC VOLVULUS, SPLEENECTOMY, GASTRIC TUBE PLACEMENT;  Surgeon: Earline Mayotte, MD;  Location: [facility] ORS;  Service: General;  Laterality: N/A;  . MOHS SURGERY    . PERIPHERAL VASCULAR CATHETERIZATION N/A 06/12/2015   Procedure: IVC Filter Insertion;  Surgeon: Annice Needy, MD;  Location: [facility] INVASIVE CV LAB;  Service: Cardiovascular;  Laterality: N/A;  . PERIPHERAL VASCULAR CATHETERIZATION N/A 08/23/2016   Procedure: IVC Filter Removal;  Surgeon: Renford Dills, MD;  Location: [facility] INVASIVE CV LAB;  Service: Cardiovascular;  Laterality: N/A;  . STOMACH SURGERY     for volvolus   HPI:  Pt is a 84 y.o. female patient w/ multiple medical issues including Large Diaphragmatic hernia, erosive Esophagitis, GERD, Feeding Problem w/ feeding tube placed in past s/p "stomach surgery d/t my stomach had flipped", Lupus who admitted today complaining of vomiting, also some lower abdominal discomfort.  Does have a history of multiple GI issues/tx and diverticulitis.  Also has a small necrotic area on the left leg for several weeks for which she is taking antibiotics the name of which she cannot recall.  She denies any fever or chills.  Began vomiting today; nonbloody nonbilious.  No melena no bright red blood per rectum.   Pain is mild; Lower left.  It does feel like her diverticulitis, a little.  Pt admitted c/o N/V w/ foods;  "spasms" in her esophagus which pt reports she has delt with in the past.  Unsure of pt's f/u w/ GI for management of her Esophageal phase dysmotility - she could not indicate any details of tx/management.  Addendum: GI followed pt today w/ Endoscopy finding: "Food was found at the gastroesophageal junction. The food was pushed through with the scope. A Large Hiatal Hernia noted."    Assessment / Plan / Recommendation Clinical Impression  Pt appears to present w/ adequate oropharyngeal phase swallow function during this brief assessment today w/ no overt s/s of aspiration noted w/ liquid trials; she appears at Reduced risk for aspiration from an oropharyngeal phase standpoint. She consumed trial swallows(2) of thin liquids via cup w/ no overt s/s of aspiration(coughing) noted; no decline in vocal quality or respiratory status noted. Oral phase appeared [facility] for bolus management of trials, A-P transfer and swallowing; oral clearing followed. However, post trials, pt placed hand on chest stating she had Esophageal phase discomfort pointing to Mid-Sternum; w/in 1-2 mins. She endorsed feelings of "spasm" which eased when she reclined slightly to rest. No further trials given awaiting GI assessment possibly today. OM exam appeared [facility] w/ no unilateral weakness noted. Speech clear. Recommend pt f/u w/ GI for further recommendation of  oral diet consistency post further consultation and assessment; a more Pureed and/or Minced diet of foods/meats w/ gravies to moisten; Thin liquids would be recommended. Recommend general aspiration precautions; Strict REFLUX precautions. Pills in Puree if needed for easier clearing. Pt appears at her baseline w/ toleration of oral intake from an oropharyngeal phase standpoint; however, pt does have significant Esophageal phase deficits including a Large Hiatal Hernia and GERD which  can impact Esophageal phase motility and increase risk for Regurgitation(N/V as she has been experiencing at home per report) which can increase risk for aspiration of Reflux thus pulmonary decline. Recommend f/u w/ GI for management, education further. NSG to reconult ST services if needed while admitted. MD/NSG updated.   SLP Visit Diagnosis: Dysphagia, unspecified (R13.10)(Esophageal phase deficits; GI)    Aspiration Risk  Mild aspiration risk(from Regurgitation and Reflux issues)    Diet Recommendation  continue w/ current NPO status pending GI assessment and recommendations for diet consistency in light of Esophageal phase deficits/dysmotility  Medication Administration: Whole meds with puree(as needed for easier clearing)    Other  Recommendations Recommended Consults: Consider GI evaluation;Consider esophageal assessment Oral Care Recommendations: Oral care BID;Patient independent with oral care Other Recommendations: (n/a)   Follow up Recommendations None      Frequency and Duration (n/a)  (n/a)       Prognosis Prognosis for Safe Diet Advancement: Fair(-Good) Barriers to Reach Goals: Time post onset;Severity of deficits(GI/Esophageal dysmotility deficits) Barriers/Prognosis Comment: Large Hiatal Hernia      Swallow Study   General Date of Onset: 03/10/19 HPI: Pt is a 84 y.o. female patient w/ multiple medical issues including Large Diaphragmatic hernia, erosive Esophagitis, GERD, Feeding Problem w/ feeding tube placed in past s/p "stomach surgery d/t my stomach had flipped", Lupus who admitted today complaining of vomiting, also some lower abdominal discomfort.  Does have a history of multiple GI issues/tx and diverticulitis.  Also has a small necrotic area on the left leg for several weeks for which she is taking antibiotics the name of which she cannot recall.  She denies any fever or chills.  Began vomiting today; nonbloody nonbilious.  No melena no bright red blood per  rectum.  Pain is mild; Lower left.  It does feel like her diverticulitis, a little.  Pt admitted c/o N/V w/ foods;  "spasms" in her esophagus which pt reports she has delt with in the past.  Unsure of pt's f/u w/ GI for management of her Esophageal phase dysmotility - she could not indicate any details of tx/management.  Addendum: GI followed pt today w/ Endoscopy finding: "Food was found at the gastroesophageal junction. The food was pushed through with the scope. A Large Hiatal Hernia noted."  Type of Study: Bedside Swallow Evaluation Previous Swallow Assessment: no oropharyngeal assessment but multiple Endoscopies and UGI studies since 2016 w/ GI.  Diet Prior to this Study: NPO Temperature Spikes Noted: No(wbc 6.6) Respiratory Status: Nasal cannula(2-3 liters) History of Recent Intubation: No Behavior/Cognition: Alert;Cooperative;Pleasant mood Oral Cavity Assessment: Within Functional Limits Oral Care Completed by SLP: Recent completion by staff Oral Cavity - Dentition: Adequate natural dentition Vision: Functional for self-feeding Self-Feeding Abilities: Able to feed self Patient Positioning: Upright in bed(assisted w/ positioning) Baseline Vocal Quality: Normal Volitional Swallow: Able to elicit    Oral/Motor/Sensory Function Overall Oral Motor/Sensory Function: Within functional limits(w/ bolus management; speech. No unilateral weakness)   Ice Chips Ice chips: Not tested   Thin Liquid Thin Liquid: Within functional limits Presentation: Cup;Self Fed(2 swallows) Other  Comments: no overt s/s of aspiration; no oral phase deficits noted; pt had Esophageal phase discomfort pointing to Mid-Sternum w/in 1-2 mins.    Nectar Thick Nectar Thick Liquid: Not tested   Honey Thick Honey Thick Liquid: Not tested   Puree Puree: Not tested   Solid     Solid: Not tested       Jerilynn Som, MS, CCC-SLP Diana [name] 03/11/2019,2:56 PM

## 2019-03-11 NOTE — Progress Notes (Addendum)
[employer] - [place] [facility]   PATIENT NAME: Diana [name]    MR#:  [id number]  DATE OF BIRTH:  12-04-1934  SUBJECTIVE:   States she is still feeling pretty bad this morning.  She endorses lot of nausea and vomiting.  She denies any hematemesis or coffee-ground emesis.  She denies any abdominal pain.  She has not had a bowel movement.  No fevers or chills.  REVIEW OF SYSTEMS:  Review of Systems  Constitutional: Negative for chills and fever.  HENT: Negative for congestion and sore throat.   Eyes: Negative for blurred vision and double vision.  Respiratory: Negative for cough and shortness of breath.   Cardiovascular: Negative for chest pain and palpitations.  Gastrointestinal: Positive for nausea and vomiting. Negative for abdominal pain, blood in stool, constipation, diarrhea and melena.  Genitourinary: Negative for dysuria and urgency.  Musculoskeletal: Negative for back pain and neck pain.  Neurological: Negative for dizziness and headaches.  Psychiatric/Behavioral: Negative for depression. The patient is not nervous/anxious.     DRUG ALLERGIES:   Allergies  Allergen Reactions   Iodinated Diagnostic Agents Hives    Pt states she broke out in hives years ago, has been premedicated in the past.    Tramadol Other (See Comments) and Rash    Rash   Amoxicillin Other (See Comments)    Reaction:  Stomach cramps    Cefuroxime Itching   Metrizamide Hives   Morphine And Related     Reaction: face flushed     Propoxyphene     Stomach cramp   Cephalexin Rash   Sulfa Antibiotics Rash    REACTION: Rash   Sulfonamide Derivatives Rash   Tramadol Hcl Rash   Venlafaxine Rash   VITALS:  Blood pressure 118/63, pulse 89, temperature (!) 97 F (36.1 C), temperature source Tympanic, resp. rate 18, height 5\' 1"  (1.549 m), weight 47.4 kg, SpO2 97 %. PHYSICAL EXAMINATION:  Physical Exam  GENERAL:  Laying in the bed with no acute distress.    HEENT: Head atraumatic, normocephalic. Pupils equal, round, reactive to light and accommodation. No scleral icterus. Extraocular muscles intact. Oropharynx and nasopharynx clear.  NECK:  Supple, no jugular venous distention. No thyroid enlargement. LUNGS: Lungs are clear to auscultation bilaterally. No wheezes, crackles, rhonchi. No use of accessory muscles of respiration.  CARDIOVASCULAR: RRR, S1, S2 normal. No murmurs, rubs, or gallops.  ABDOMEN: Soft, nondistended. Bowel sounds present. + Mild diffuse tenderness to palpation, no rebound or guarding. EXTREMITIES: No pedal edema, cyanosis, or clubbing.  NEUROLOGIC: CN 2-12 intact, no focal deficits. 5/5 muscle strength throughout all extremities. Sensation intact throughout. Gait not checked.  PSYCHIATRIC: The patient is alert and oriented x 3.  SKIN: No obvious rash, lesion, or ulcer.  LABORATORY PANEL:  Female CBC Recent Labs  Lab 03/11/19 0450  WBC 6.6  HGB 8.7*  HCT 30.6*  PLT 312   ------------------------------------------------------------------------------------------------------------------ Chemistries  Recent Labs  Lab 03/10/19 1746 03/11/19 0450  NA 149* 147*  K 4.3 4.0  CL 119* 123*  CO2 15* 16*  GLUCOSE 69* 92  BUN 39* 35*  CREATININE 1.80* 1.74*  CALCIUM 9.0 8.0*  AST 26  --   ALT 13  --   ALKPHOS 129*  --   BILITOT 0.5  --    RADIOLOGY:  Ct Abdomen Pelvis Wo Contrast  Result Date: 03/10/2019 CLINICAL DATA:  Vomiting. EXAM: CT ABDOMEN AND PELVIS WITHOUT CONTRAST TECHNIQUE: Multidetector CT imaging of the abdomen and pelvis  was performed following the standard protocol without IV contrast. COMPARISON:  07/13/2018 FINDINGS: Lower chest: Bibasilar atelectasis. Large hiatal hernia. Calcific atherosclerotic disease of the coronary arteries. Hepatobiliary: No focal liver abnormality is seen. No gallstones, gallbladder wall thickening, or biliary dilatation. Pancreas: Atrophic pancreas. Spleen: The spleen is  surgically. Adrenals/Urinary Tract: Normal adrenal glands. Bilateral renal cysts, the largest in the midpole region of the left kidney measuring 3.1 cm. Normal urinary bladder. Stomach/Bowel: Large hiatal hernia with diffuse circumferential mucosal thickening of the gastric mucosa within the hernial sac and in the anatomic location of the stomach. No evidence of small-bowel obstruction. Extensive colonic diverticulosis. Long segment of mucosal thickening of the ascending, transverse and sigmoid colon likely represent diverticulitis or other forms of colitis. Vascular/Lymphatic: Calcific atherosclerotic disease and severe tortuosity of the aorta. Reproductive: Uterus and bilateral adnexa are unremarkable. Other: No abdominal wall hernia or abnormality. No abdominopelvic ascites. Musculoskeletal: Osteopenia. Scoliosis. Prior vertebroplasty. Diffuse spondylosis of the lumbosacral spine. IMPRESSION: 1. Large hiatal hernia with diffuse circumferential mucosal thickening of the gastric mucosa within the hernial sac and in the anatomic location of the stomach. This may represent diffuse gastritis, however gastric malignancy cannot be entirely excluded. 2. Extensive colonic diverticulosis. Long segment of mucosal thickening of the ascending, transverse and sigmoid colon, likely representing diverticulitis or other form of colitis. 3. Calcific atherosclerotic disease of the aorta and marked tortuosity. Electronically Signed   By: Ted Mcalpine M.D.   On: 03/10/2019 19:12   US Venous Img Lower Unilateral Left  Result Date: 03/11/2019 CLINICAL DATA:  Left lower extremity edema. History of prior lower extremity DVT. EXAM: LEFT LOWER EXTREMITY VENOUS DOPPLER ULTRASOUND TECHNIQUE: Gray-scale sonography with graded compression, as well as color Doppler and duplex ultrasound were performed to evaluate the lower extremity deep venous systems from the level of the common femoral vein and including the common femoral,  femoral, profunda femoral, popliteal and calf veins including the posterior tibial, peroneal and gastrocnemius veins when visible. The superficial great saphenous vein was also interrogated. Spectral Doppler was utilized to evaluate flow at rest and with distal augmentation maneuvers in the common femoral, femoral and popliteal veins. COMPARISON:  06/11/2015 FINDINGS: Contralateral Common Femoral Vein: Respiratory phasicity is normal and symmetric with the symptomatic side. No evidence of thrombus. Normal compressibility. Common Femoral Vein: No evidence of thrombus. Normal compressibility, respiratory phasicity and response to augmentation. Saphenofemoral Junction: No evidence of thrombus. Normal compressibility and flow on color Doppler imaging. Profunda Femoral Vein: No evidence of thrombus. Normal compressibility and flow on color Doppler imaging. Femoral Vein: No evidence of thrombus. Normal compressibility, respiratory phasicity and response to augmentation. Popliteal Vein: No evidence of thrombus. Normal compressibility, respiratory phasicity and response to augmentation. Calf Veins: No evidence of thrombus. Normal compressibility and flow on color Doppler imaging. Superficial Great Saphenous Vein: No evidence of thrombus. Normal compressibility. Venous Reflux:  None. Other Findings: No evidence of superficial thrombophlebitis or abnormal fluid collection. IMPRESSION: No evidence of left lower extremity deep venous thrombosis. Electronically Signed   By: Irish Lack M.D.   On: 03/11/2019 09:40   Dg Chest Port 1 View  Result Date: 03/10/2019 CLINICAL DATA:  Vomiting. Weakness. EXAM: PORTABLE CHEST 1 VIEW COMPARISON:  03/31/2015 FINDINGS: Cardiomediastinal silhouette is enlarged. Mediastinal contours appear intact. Tortuosity and calcific atherosclerotic disease of the aorta. Large hiatal hernia. There is no evidence of focal airspace consolidation, pleural effusion or pneumothorax. Osseous structures  are without acute abnormality. Soft tissues are grossly normal. IMPRESSION: 1. Enlarged cardiac  silhouette. 2. Large hiatal hernia. Electronically Signed   By: Ted Mcalpine M.D.   On: 03/10/2019 19:14   ASSESSMENT AND PLAN:   Acute diverticulitis- no signs of sepsis -Continue IV Cipro and Flagyl -Continue IV pain meds and IV antiemetics -Continue IV fluids  Left lower extremity cellulitis -LLE Doppler ultrasound negative for DVT -Continue IV antibiotics as above -Wound care consult  Dysphagia- patient does have a history of CMV esophagitis -CT abdomen/pelvs showed diffuse circumferential mucosal thickening of the gastric mucosa within the hernial sac, cannot exclude malignancy -GI consult- plan for EGD today  -SLP eval pending -Continue Reglan, Carafate, and PPI   Elevated troponin- likely demand ischemia, patient denies any active chest pain. -Trend troponins  History of DVT -Coumadin per pharmacy  Hypothyroidism -TSH 0.031 this admission -Decrease synthroid to daily -Needs thyroid function tests repeated as an outpatient  Macrocytic anemia- may just be anemia of chronic kidney disease. Folate and Vitamin B12 were normal 10/09/18. Recent ferritin normal. -Monitor  CKD III- creatine at baseline -Avoid nephrotoxic agents  DVT prophylaxis- Coumadin  All the records are reviewed and case discussed with Care Management/Social Worker. Management plans discussed with the patient, family and they are in agreement.  CODE STATUS: Full Code  TOTAL TIME TAKING CARE OF THIS PATIENT: 45 minutes.   More than 50% of the time was spent in counseling/coordination of care: YES  POSSIBLE D/C IN 1-2 DAYS, DEPENDING ON CLINICAL CONDITION.   Jinny Blossom Tammala Weider M.D on 03/11/2019 at 12:45 PM  Between 7am to 6pm - Pager - [phone]  After 6pm go to [email or web address] - Social research officer, government  [facility] [place] [facility]  Office  [phone]  CC: Primary care  physician; Sherlene Shams, MD  Note: This dictation was prepared with Dragon dictation along with smaller phrase technology. Any transcriptional errors that result from this process are unintentional.

## 2019-03-11 NOTE — Progress Notes (Signed)
ANTICOAGULATION CONSULT NOTE - Initial Consult  Pharmacy Consult for warfarin Indication: atrial fibrillation and DVT (hx of)  Allergies  Allergen Reactions  . Iodinated Diagnostic Agents Hives    Pt states she broke out in hives years ago, has been premedicated in the past.   . Tramadol Other (See Comments) and Rash    Rash  . Amoxicillin Other (See Comments)    Reaction:  Stomach cramps   . Cefuroxime Itching  . Metrizamide Hives  . Morphine And Related     Reaction: face flushed    . Propoxyphene     Stomach cramp  . Cephalexin Rash  . Sulfa Antibiotics Rash    REACTION: Rash  . Sulfonamide Derivatives Rash  . Tramadol Hcl Rash  . Venlafaxine Rash    Patient Measurements: Height: 5\' 1"  (154.9 cm) Weight: 104 lb 8 oz (47.4 kg) IBW/kg (Calculated) : 47.8  Vital Signs: Temp: 97 F (36.1 C) (06/22 1323) Temp Source: Tympanic (06/22 1320) BP: 122/71 (06/22 1323) Pulse Rate: 112 (06/22 1323)  Labs: Recent Labs    03/10/19 1746 03/10/19 1813 03/10/19 2210 03/11/19 0450 03/11/19 1117  HGB 10.5*  --   --  8.7*  --   HCT 37.3  --   --  30.6*  --   PLT 275  --   --  312  --   LABPROT  --  25.1*  --  27.8*  --   INR  --  2.3*  --  2.6*  --   CREATININE 1.80*  --   --  1.74*  --   TROPONINI 0.06*  --  0.08* 0.11* 0.12*    Estimated Creatinine Clearance: 18.7 mL/min (A) (by C-G formula based on SCr of 1.74 mg/dL (H)).  Assessment: 84 yo female on warfarin PTA now started on Cipro/Flagyl (significant drug interaction). Home dose noted to be warfarin 3 mg Sun, Mon, Wed, Thurs, Fridays; does NOT take warfarin on Tuesdays and Saturdays.   Goal of Therapy:  INR 2-3 Monitor platelets by anticoagulation protocol: Yes   Plan:  Will decrease dose to warfarin 2 mg for tonight due to decent increase in INR from 2.3>2.6 overnight and significant DDI. Daily INRs. CBC at least every three days. CBC ordered for AM by MD (decrease in Hgb, discussed with MD).   Pharmacy will  continue to follow.   Diana [name] 03/11/2019,1:31 PM

## 2019-03-11 NOTE — Anesthesia Postprocedure Evaluation (Signed)
Anesthesia Post Note  Patient: Diana [name]  Procedure(s) Performed: ESOPHAGOGASTRODUODENOSCOPY (EGD) WITH PROPOFOL (N/A )  Patient location during evaluation: PACU Anesthesia Type: General Level of consciousness: awake and alert Pain management: pain level controlled Vital Signs Assessment: post-procedure vital signs reviewed and stable Respiratory status: spontaneous breathing, nonlabored ventilation, respiratory function stable and patient connected to nasal cannula oxygen Cardiovascular status: blood pressure returned to baseline and stable Postop Assessment: no apparent nausea or vomiting Anesthetic complications: no     Last Vitals:  Vitals:   03/11/19 1320 03/11/19 1323  BP:  122/71  Pulse:  (!) 112  Resp:  (!) 34  Temp: (!) 36.2 C (!) 36.1 C  SpO2:  95%    Last Pain:  Vitals:   03/11/19 1340  TempSrc:   PainSc: 0-No pain                 Prabhjot Piscitello S

## 2019-03-11 NOTE — Anesthesia Post-op Follow-up Note (Signed)
Anesthesia QCDR form completed.        

## 2019-03-11 NOTE — Op Note (Signed)
[facility] Gastroenterology Patient Name: Diana [name] Procedure Date: 03/11/2019 12:51 PM MRN: [id number] Account #: [id number] Date of Birth: 1935-05-24 Admit Type: Inpatient Age: 84 Room: [facility] ENDO ROOM 4 Gender: Female Note Status: Finalized Procedure:            Upper GI endoscopy Indications:          Dysphagia Providers:            Midge Minium MD, MD Referring MD:         Duncan Dull, MD (Referring MD) Medicines:            Propofol per Anesthesia Complications:        No immediate complications. Procedure:            Pre-Anesthesia Assessment:                       - Prior to the procedure, a History and Physical was                        performed, and patient medications and allergies were                        reviewed. The patient's tolerance of previous                        anesthesia was also reviewed. The risks and benefits of                        the procedure and the sedation options and risks were                        discussed with the patient. All questions were                        answered, and informed consent was obtained. Prior                        Anticoagulants: The patient has taken Coumadin                        (warfarin), last dose was 2 days prior to procedure.                        ASA Grade Assessment: III - A patient with severe                        systemic disease. After reviewing the risks and                        benefits, the patient was deemed in satisfactory                        condition to undergo the procedure.                       After obtaining informed consent, the endoscope was                        passed under direct vision. Throughout the procedure,  the patient's blood pressure, pulse, and oxygen                        saturations were monitored continuously. The Endoscope                        was introduced through the mouth, and advanced to the          second part of duodenum. The upper GI endoscopy was                        accomplished without difficulty. The patient tolerated                        the procedure well. Findings:      Food was found at the gastroesophageal junction. The food was pushed       through with the scope.      A large hiatal hernia was present.      One benign-appearing, intrinsic severe stenosis was found at the       gastroesophageal junction. The stenosis was traversed.      The stomach was normal.      The examined duodenum was normal. Impression:           - Food at the gastroesophageal junction.                       - Large hiatal hernia.                       - Benign-appearing esophageal stenosis.                       - Normal stomach.                       - Normal examined duodenum.                       - No specimens collected. Recommendation:       - Return patient to hospital ward for ongoing care.                       - Mechanical soft diet.                       - Continue present medications. Procedure Code(s):    --- Professional ---                       [phone], Esophagogastroduodenoscopy, flexible, transoral;                        diagnostic, including collection of specimen(s) by                        brushing or washing, when performed (separate procedure) Diagnosis Code(s):    --- Professional ---                       Z36.644I, Food in esophagus causing other injury,                        initial encounter  R13.10, Dysphagia, unspecified                       K22.2, Esophageal obstruction CPT copyright 2019 American Medical Association. All rights reserved. The codes documented in this report are preliminary and upon coder review may  be revised to meet current compliance requirements. Midge Minium MD, MD 03/11/2019 1:33:17 PM This report has been signed electronically. Number of Addenda: 0 Note Initiated On: 03/11/2019 12:51 PM Estimated Blood  Loss: Estimated blood loss: none. Estimated blood loss: none.      [facility]

## 2019-03-11 NOTE — Transfer of Care (Signed)
Immediate Anesthesia Transfer of Care Note  Patient: Diana [name]  Procedure(s) Performed: ESOPHAGOGASTRODUODENOSCOPY (EGD) WITH PROPOFOL (N/A )  Patient Location: PACU  Anesthesia Type:General  Level of Consciousness: awake, alert  and oriented  Airway & Oxygen Therapy: Patient Spontanous Breathing and Patient connected to nasal cannula oxygen  Post-op Assessment: Report given to RN and Post -op Vital signs reviewed and stable  Post vital signs: Reviewed and stable  Last Vitals:  Vitals Value Taken Time  BP 122/71 03/11/19 1323  Temp 36.1 C 03/11/19 1323  Pulse 101 03/11/19 1323  Resp 29 03/11/19 1323  SpO2 95 % 03/11/19 1323  Vitals shown include unvalidated device data.  Last Pain:  Vitals:   03/11/19 1323  TempSrc:   PainSc: 0-No pain         Complications: No apparent anesthesia complications

## 2019-03-11 NOTE — Progress Notes (Signed)
Initial Nutrition Assessment  RD working remotely.  DOCUMENTATION CODES:   Not applicable  INTERVENTION:  Will monitor for diet advancement and recommend appropriate nutrition interventions.  NUTRITION DIAGNOSIS:   Inadequate oral intake related to decreased appetite, dysphagia, nausea as evidenced by (per chart).  GOAL:   Patient will meet greater than or equal to 90% of their needs  MONITOR:   PO intake, Diet advancement, Supplement acceptance, Labs, Weight trends, I & O's, Skin  REASON FOR ASSESSMENT:   Malnutrition Screening Tool    ASSESSMENT:   84 year old female with PMHx of lupus, hiatal hernia, hx DVT, CHF, renal insufficiency, HTN, HLD, esophageal reflux, sciatica, A-fib, neuropathy, breast cancer s/p XRT, hx feeding tube placement who is now admitted with diverticulitis, left lower extremity cellulitis, dysphagia, dehydration.   Patient down in endoscopy so unable to provide history. Noted patient has a hx of feeding tube placement that has since been removed but unsure on details. Per chart yesterday she began having difficulty swallowing and N/V again. She has a history of difficulty swallowing. Patient has been NPO for EGD today. Will monitor outcome and for diet advancement.  Per chart patient was 55.3 kg on 06/04/2018. She is currently 47.4 kg (104.5 lbs). She has lost 7.9 kg (14.3% body weight) over the past 9 months, which is not quite significant for time frame but is still concerning.  Medications reviewed and include: levothyroxine, Reglan 10 mg TID and QHS, Cellcept, pantoprazole, prednisone 5 mg daily, Carafate 1 gram QID, warfarin, Cipro, D5-1/2NS at 50 mL/hr, Flagyl.  Labs reviewed: Sodium 147, CO2 16, BUN 35, Creatinine 1.74.  Patient is at risk for malnutrition. Unable to identify if patient meets criteria without completing NFPE.  NUTRITION - FOCUSED PHYSICAL EXAM:  Unable to complete at this time.  Diet Order:   Diet Order            Diet  NPO time specified  Diet effective now             EDUCATION NEEDS:   No education needs have been identified at this time  Skin:  Skin Assessment: Reviewed RN Assessment  Last BM:  03/10/2019 per chart  Height:   Ht Readings from Last 1 Encounters:  03/11/19 5\' 1"  (1.549 m)   Weight:   Wt Readings from Last 1 Encounters:  03/11/19 47.4 kg   Ideal Body Weight:  47.7 kg  BMI:  Body mass index is 19.74 kg/m.  Estimated Nutritional Needs:   Kcal:  1300-1500  Protein:  65-75 grams  Fluid:  1.3-1.5 L/day  Willey Blade, MS, RD, LDN Office: [phone] Pager: [phone] After Hours/Weekend Pager: [phone]

## 2019-03-12 ENCOUNTER — Encounter: Payer: Self-pay | Admitting: Gastroenterology

## 2019-03-12 LAB — BASIC METABOLIC PANEL
Anion gap: 9 (ref 5–15)
BUN: 25 mg/dL — ABNORMAL HIGH (ref 8–23)
CO2: 21 mmol/L — ABNORMAL LOW (ref 22–32)
Calcium: 7.7 mg/dL — ABNORMAL LOW (ref 8.9–10.3)
Chloride: 116 mmol/L — ABNORMAL HIGH (ref 98–111)
Creatinine, Ser: 1.52 mg/dL — ABNORMAL HIGH (ref 0.44–1.00)
GFR calc Af Amer: 37 mL/min — ABNORMAL LOW (ref >60)
GFR calc non Af Amer: 32 mL/min — ABNORMAL LOW (ref >60)
Glucose, Bld: 108 mg/dL — ABNORMAL HIGH (ref 70–99)
Potassium: 3.6 mmol/L (ref 3.5–5.1)
Sodium: 146 mmol/L — ABNORMAL HIGH (ref 135–145)

## 2019-03-12 LAB — CBC
HCT: 28 % — ABNORMAL LOW (ref 36.0–46.0)
Hemoglobin: 8.1 g/dL — ABNORMAL LOW (ref 12.0–15.0)
MCH: 30.7 pg (ref 26.0–34.0)
MCHC: 28.9 g/dL — ABNORMAL LOW (ref 30.0–36.0)
MCV: 106.1 fL — ABNORMAL HIGH (ref 80.0–100.0)
Platelets: 279 10*3/uL (ref 150–400)
RBC: 2.64 MIL/uL — ABNORMAL LOW (ref 3.87–5.11)
RDW: 16.8 % — ABNORMAL HIGH (ref 11.5–15.5)
WBC: 4.7 10*3/uL (ref 4.0–10.5)
nRBC: 1.7 % — ABNORMAL HIGH (ref 0.0–0.2)

## 2019-03-12 LAB — GLUCOSE, CAPILLARY
Glucose-Capillary: 110 mg/dL — ABNORMAL HIGH (ref 70–99)
Glucose-Capillary: 112 mg/dL — ABNORMAL HIGH (ref 70–99)
Glucose-Capillary: 142 mg/dL — ABNORMAL HIGH (ref 70–99)
Glucose-Capillary: 148 mg/dL — ABNORMAL HIGH (ref 70–99)
Glucose-Capillary: 83 mg/dL (ref 70–99)
Glucose-Capillary: 88 mg/dL (ref 70–99)

## 2019-03-12 LAB — TROPONIN I: Troponin I: 0.09 ng/mL (ref <0.03)

## 2019-03-12 LAB — PROTIME-INR
INR: 3.2 — ABNORMAL HIGH (ref 0.8–1.2)
Prothrombin Time: 32.2 seconds — ABNORMAL HIGH (ref 11.4–15.2)

## 2019-03-12 MED ORDER — METRONIDAZOLE 500 MG PO TABS
500.0000 mg | ORAL_TABLET | Freq: Three times a day (TID) | ORAL | Status: DC
  Administered 2019-03-12 – 2019-03-13 (×4): 500 mg via ORAL
  Filled 2019-03-12 (×5): qty 1

## 2019-03-12 MED ORDER — ADULT MULTIVITAMIN W/MINERALS CH
1.0000 | ORAL_TABLET | Freq: Every day | ORAL | Status: DC
  Administered 2019-03-13: 1 via ORAL
  Filled 2019-03-12: qty 1

## 2019-03-12 MED ORDER — CIPROFLOXACIN HCL 500 MG PO TABS
500.0000 mg | ORAL_TABLET | ORAL | Status: DC
  Administered 2019-03-12: 500 mg via ORAL
  Filled 2019-03-12: qty 1

## 2019-03-12 MED ORDER — ENSURE ENLIVE PO LIQD
237.0000 mL | Freq: Two times a day (BID) | ORAL | Status: DC
  Administered 2019-03-12 – 2019-03-13 (×3): 237 mL via ORAL

## 2019-03-12 MED ORDER — OXYCODONE-ACETAMINOPHEN 5-325 MG PO TABS
1.0000 | ORAL_TABLET | Freq: Two times a day (BID) | ORAL | Status: DC | PRN
  Administered 2019-03-12: 1 via ORAL
  Filled 2019-03-12: qty 1

## 2019-03-12 NOTE — Progress Notes (Addendum)
[facility] [place] [facility]   PATIENT NAME: Diana [name]    MR#:  [id number]  DATE OF BIRTH:  [birth year]  SUBJECTIVE:   States she is feeling much better this morning.  Her nausea has greatly improved from yesterday.  No additional episodes of vomiting.  She denies any abdominal pain or diarrhea.  REVIEW OF SYSTEMS:  Review of Systems  Constitutional: Negative for chills and fever.  HENT: Negative for congestion and sore throat.   Eyes: Negative for blurred vision and double vision.  Respiratory: Negative for cough and shortness of breath.   Cardiovascular: Negative for chest pain and palpitations.  Gastrointestinal: Negative for abdominal pain, blood in stool, constipation, diarrhea, melena, nausea and vomiting.  Genitourinary: Negative for dysuria and urgency.  Musculoskeletal: Negative for back pain and neck pain.  Neurological: Negative for dizziness and headaches.  Psychiatric/Behavioral: Negative for depression. The patient is not nervous/anxious.     DRUG ALLERGIES:   Allergies  Allergen Reactions  . Iodinated Diagnostic Agents Hives    Pt states she broke out in hives years ago, has been premedicated in the past.   . Tramadol Other (See Comments) and Rash    Rash  . Amoxicillin Other (See Comments)    Reaction:  Stomach cramps   . Cefuroxime Itching  . Metrizamide Hives  . Morphine And Related     Reaction: face flushed    . Propoxyphene     Stomach cramp  . Cephalexin Rash  . Sulfa Antibiotics Rash    REACTION: Rash  . Sulfonamide Derivatives Rash  . Tramadol Hcl Rash  . Venlafaxine Rash   VITALS:  Blood pressure 117/66, pulse 65, temperature 97.7 F (36.5 C), temperature source Oral, resp. rate 15, height 5\' 1"  (1.549 m), weight 47.4 kg, SpO2 95 %. PHYSICAL EXAMINATION:  Physical Exam  GENERAL:  Laying in the bed with no acute distress.  HEENT: Head atraumatic, normocephalic. Pupils equal, round, reactive to light and  accommodation. No scleral icterus. Extraocular muscles intact. Oropharynx and nasopharynx clear.  NECK:  Supple, no jugular venous distention. No thyroid enlargement. LUNGS: Lungs are clear to auscultation bilaterally. No wheezes, crackles, rhonchi. No use of accessory muscles of respiration.  CARDIOVASCULAR: RRR, S1, S2 normal. No murmurs, rubs, or gallops.  ABDOMEN: Soft, nondistended. Bowel sounds present. + Mild diffuse tenderness to palpation, no rebound or guarding. EXTREMITIES: No pedal edema, cyanosis, or clubbing.  NEUROLOGIC: CN 2-12 intact, no focal deficits. 5/5 muscle strength throughout all extremities. Sensation intact throughout. Gait not checked.  PSYCHIATRIC: The patient is alert and oriented x 3.  SKIN: No obvious rash, lesion, or ulcer.  LABORATORY PANEL:  Female CBC Recent Labs  Lab 03/12/19 0305  WBC 4.7  HGB 8.1*  HCT 28.0*  PLT 279   ------------------------------------------------------------------------------------------------------------------ Chemistries  Recent Labs  Lab 03/10/19 1746  03/12/19 0305  NA 149*   < > 146*  K 4.3   < > 3.6  CL 119*   < > 116*  CO2 15*   < > 21*  GLUCOSE 69*   < > 108*  BUN 39*   < > 25*  CREATININE 1.80*   < > 1.52*  CALCIUM 9.0   < > 7.7*  AST 26  --   --   ALT 13  --   --   ALKPHOS 129*  --   --   BILITOT 0.5  --   --    < > = values in  this interval not displayed.   RADIOLOGY:  No results found. ASSESSMENT AND PLAN:   Acute diverticulitis- no signs of sepsis. Symptoms improving. -Continue IV Cipro and Flagyl -Continue IV pain meds and IV antiemetics -Continue IV fluids -PT ordered  Left lower extremity cellulitis- improving -LLE Doppler ultrasound negative for DVT -Continue IV antibiotics as above -Wound culture pending -Wound care consult  Dysphagia- patient does have a history of CMV esophagitis -CT abdomen/pelvs showed diffuse circumferential mucosal thickening of the gastric mucosa within the  hernial sac, cannot exclude malignancy -s/p EGD 6/22 with large hiatal hernia and benign esophageal stenosis -GI following -SLP eval pending -Continue Reglan, Carafate, and PPI  -Palliative care consult for goals of care discussion  Elevated troponin- likely demand ischemia, patient denies any active chest pain. -Troponins flat, will stop trending  History of DVT -Coumadin per pharmacy  Hypothyroidism -TSH 0.031 this admission -Continue Synthroid at decreased dose of daily -Needs thyroid function tests repeated as an outpatient  Macrocytic anemia- may just be anemia of chronic kidney disease. Folate and Vitamin B12 were normal 10/09/18. Recent ferritin normal. -Monitor  CKD III- creatine at baseline -Avoid nephrotoxic agents  DVT prophylaxis- Coumadin  All the records are reviewed and case discussed with Care Management/Social Worker. Management plans discussed with the patient, family and they are in agreement.  CODE STATUS: Full Code  TOTAL TIME TAKING CARE OF THIS PATIENT: 35 minutes.   More than 50% of the time was spent in counseling/coordination of care: YES  POSSIBLE D/C IN 1-2 DAYS, DEPENDING ON CLINICAL CONDITION.   Jinny Blossom Jonhatan Hearty M.D on 03/12/2019 at 12:05 PM  Between 7am to 6pm - Pager - [phone]  After 6pm go to [email or web address] - Social research officer, government  [facility]  Office  [phone]  CC: Primary care physician; Sherlene Shams, MD  Note: This dictation was prepared with Dragon dictation along with smaller phrase technology. Any transcriptional errors that result from this process are unintentional.

## 2019-03-12 NOTE — Progress Notes (Addendum)
[place] for warfarin Indication: atrial fibrillation and DVT (hx of)  Allergies  Allergen Reactions  . Iodinated Diagnostic Agents Hives    Pt states she broke out in hives years ago, has been premedicated in the past.   . Tramadol Other (See Comments) and Rash    Rash  . Amoxicillin Other (See Comments)    Reaction:  Stomach cramps   . Cefuroxime Itching  . Metrizamide Hives  . Morphine And Related     Reaction: face flushed    . Propoxyphene     Stomach cramp  . Cephalexin Rash  . Sulfa Antibiotics Rash    REACTION: Rash  . Sulfonamide Derivatives Rash  . Tramadol Hcl Rash  . Venlafaxine Rash    Patient Measurements: Height: 5\' 1"  (154.9 cm) Weight: 104 lb 8 oz (47.4 kg) IBW/kg (Calculated) : 47.8  Vital Signs: Temp: 97.9 F (36.6 C) (06/23 0434) Temp Source: Oral (06/23 0434) BP: 116/58 (06/23 0434) Pulse Rate: 70 (06/23 0434)  Labs: Recent Labs    03/10/19 1746 03/10/19 1813  03/11/19 0450  03/11/19 1525 03/11/19 2045 03/12/19 0305  HGB 10.5*  --   --  8.7*  --   --   --  8.1*  HCT 37.3  --   --  30.6*  --   --   --  28.0*  PLT 275  --   --  312  --   --   --  279  LABPROT  --  25.1*  --  27.8*  --   --   --  32.2*  INR  --  2.3*  --  2.6*  --   --   --  3.2*  CREATININE 1.80*  --   --  1.74*  --   --   --  1.52*  TROPONINI 0.06*  --    < > 0.11*   < > 0.10* 0.11* 0.09*   < > = values in this interval not displayed.    Estimated Creatinine Clearance: 21.4 mL/min (A) (by C-G formula based on SCr of 1.52 mg/dL (H)).  Assessment: 84 yo female on warfarin PTA now started on Cipro/Flagyl (significant drug interaction). Home dose noted to be warfarin 3 mg Sun, Mon, Wed, Thurs, Fridays; does NOT take warfarin on Tuesdays and Saturdays.   Last dose pta 3mg  6/19 @ unknown time per pt report - no doses given since admission.             INR    Warfarin Dose 6/21  2.3 held 6/22 2.6 held 6/23 3.2 Will hold  Goal of  Therapy:  INR 2-3 Monitor platelets by anticoagulation protocol: Yes   Plan:  Will hold warfarin for tonight both due to decent increase in INR from 2.6>3.2 overnight and significant DDI. Possible planned procedure today.  Also, it is Tuesday.   Daily INRs. CBC at least every three days. CBC ordered for AM by MD (Hgb trending down 10.5>8.7>8.1).   Pharmacy will continue to follow.   Lu Duffel, PharmD, BCPS Clinical Pharmacist 03/12/2019 7:31 AM

## 2019-03-12 NOTE — Evaluation (Signed)
Physical Therapy Evaluation Patient Details Name: Diana [name] MRN: [id number] DOB: 1935/02/08 Today's Date: 03/12/2019   History of Present Illness  84 y.o. female with a known history of esophagitis, CKD, atrial fibrillation, CHF, hypertension, history of DVT on anticoagulant therapy with Coumadin.  She presented to the emergency room complaining of nausea and vomiting.  Clinical Impression  Pt showed good confidence with mobility and transfers.  She showed some mild hesitancy with ambulation, poor posture, but was able to ambulate into the hallway w/o AD and only minimal VCs, pt reports being near her baseline though somewhat slower and more guarded.  Good overall effort, no overt safety issues; will complete PT orders.      Follow Up Recommendations No PT follow up    Equipment Recommendations  None recommended by PT    Recommendations for Other Services       Precautions / Restrictions Precautions Precautions: Fall Restrictions Weight Bearing Restrictions: No      Mobility  Bed Mobility Overal bed mobility: Modified Independent             General bed mobility comments: Pt slow to get LEs (especially L secondary to wound) but able to attain sitting w/o assist  Transfers Overall transfer level: Independent Equipment used: None             General transfer comment: Pt able to rise w/o direct assist, showed good stability in standing  Ambulation/Gait Ambulation/Gait assistance: Supervision Gait Distance (Feet): 75 Feet Assistive device: None       General Gait Details: Pt was able to ambulate with slow but safe cadence that is near her baseline.  Pt with severe scoliosis that leads to stooped posture but no overt safety issues.   Stairs            Wheelchair Mobility    Modified Rankin (Stroke Patients Only)       Balance Overall balance assessment: Modified Independent                                            Pertinent Vitals/Pain Pain Assessment: No/denies pain(some back pain sitting in recliner post session)    Home Living Family/patient expects to be discharged to:: Private residence Living Arrangements: Children(son) Available Help at Discharge: Family   Home Access: Level entry       Home Equipment: Environmental consultant - 2 wheels      Prior Function Level of Independence: Independent         Comments: Pt reports that she does not need AD, is out in the community consistently (less secondary to Covid) but able to do all she needs w/o assist     Hand Dominance        Extremity/Trunk Assessment   Upper Extremity Assessment Upper Extremity Assessment: Overall WFL for tasks assessed;Generalized weakness    Lower Extremity Assessment Lower Extremity Assessment: Overall WFL for tasks assessed;Generalized weakness       Communication   Communication: No difficulties  Cognition Arousal/Alertness: Awake/alert Behavior During Therapy: WFL for tasks assessed/performed Overall Cognitive Status: Within Functional Limits for tasks assessed                                        General Comments      Exercises  Assessment/Plan    PT Assessment Patent does not need any further PT services  PT Problem List         PT Treatment Interventions      PT Goals (Current goals can be found in the Care Plan section)  Acute Rehab PT Goals Patient Stated Goal: Go home PT Goal Formulation: All assessment and education complete, DC therapy    Frequency     Barriers to discharge        Co-evaluation               AM-PAC PT "6 Clicks" Mobility  Outcome Measure Help needed turning from your back to your side while in a flat bed without using bedrails?: None Help needed moving from lying on your back to sitting on the side of a flat bed without using bedrails?: None Help needed moving to and from a bed to a chair (including a wheelchair)?: None Help  needed standing up from a chair using your arms (e.g., wheelchair or bedside chair)?: None Help needed to walk in hospital room?: None Help needed climbing 3-5 steps with a railing? : A Little 6 Click Score: 23    End of Session Equipment Utilized During Treatment: Gait belt Activity Tolerance: Patient tolerated treatment well Patient left: with chair alarm set;with call bell/phone within reach   PT Visit Diagnosis: Muscle weakness (generalized) (M62.81);Difficulty in walking, not elsewhere classified (R26.2)    Time: 8657-8469 PT Time Calculation (min) (ACUTE ONLY): 23 min   Charges:   PT Evaluation $PT Eval Low Complexity: 1 Low PT Treatments $Gait Training: 8-22 mins        Malachi Pro, DPT 03/12/2019, 2:06 PM

## 2019-03-12 NOTE — Progress Notes (Signed)
Diana Lame, MD [facility]   [address] [place], [place] [address] Phone: [phone] Fax : [phone]   Subjective: The patient reports that she is eating better but has back pain from sitting up in the chair.  The patient reports he has scoliosis.  There is no report of any nausea vomiting.  The patient had a food impaction removed yesterday with a narrowing of the distal esophagus.   Objective: Vital signs in last 24 hours: Vitals:   03/11/19 2000 03/11/19 2018 03/12/19 0434 03/12/19 1141  BP:  125/61 (!) 116/58 117/66  Pulse:  76 70 65  Resp: 20 20 15    Temp:  99.1 F (37.3 C) 97.9 F (36.6 C) 97.7 F (36.5 C)  TempSrc:  Oral Oral Oral  SpO2:  96% 98% 95%  Weight:      Height:       Weight change: 0.226 kg  Intake/Output Summary (Last 24 hours) at 03/12/2019 1458 Last data filed at 03/12/2019 1356 Gross per 24 hour  Intake 947.65 ml  Output 300 ml  Net 647.65 ml     Exam: Heart:: Regular rate and rhythm, S1S2 present or without murmur or extra heart sounds Lungs: normal and clear to auscultation and percussion Abdomen: soft, nontender, normal bowel sounds   Lab Results: @LABTEST2 @ Micro Results: Recent Results (from the past 240 hour(s))  SARS Coronavirus 2 (CEPHEID - Performed in [facility] hospital lab), Hosp Order     Status: None   Collection Time: 03/10/19  7:00 PM   Specimen: Nasopharyngeal Swab  Result Value Ref Range Status   SARS Coronavirus 2 NEGATIVE NEGATIVE Final    Comment: (NOTE) If result is NEGATIVE SARS-CoV-2 target nucleic acids are NOT DETECTED. The SARS-CoV-2 RNA is generally detectable in upper and lower  respiratory specimens during the acute phase of infection. The lowest  concentration of SARS-CoV-2 viral copies this assay can detect is 250  copies / mL. A negative result does not preclude SARS-CoV-2 infection  and should not be used as the sole basis for treatment or other  patient management decisions.  A negative  result may occur with  improper specimen collection / handling, submission of specimen other  than nasopharyngeal swab, presence of viral mutation(s) within the  areas targeted by this assay, and inadequate number of viral copies  (<250 copies / mL). A negative result must be combined with clinical  observations, patient history, and epidemiological information. If result is POSITIVE SARS-CoV-2 target nucleic acids are DETECTED. The SARS-CoV-2 RNA is generally detectable in upper and lower  respiratory specimens dur ing the acute phase of infection.  Positive  results are indicative of active infection with SARS-CoV-2.  Clinical  correlation with patient history and other diagnostic information is  necessary to determine patient infection status.  Positive results do  not rule out bacterial infection or co-infection with other viruses. If result is PRESUMPTIVE POSTIVE SARS-CoV-2 nucleic acids MAY BE PRESENT.   A presumptive positive result was obtained on the submitted specimen  and confirmed on repeat testing.  While 2019 novel coronavirus  (SARS-CoV-2) nucleic acids may be present in the submitted sample  additional confirmatory testing may be necessary for epidemiological  and / or clinical management purposes  to differentiate between  SARS-CoV-2 and other Sarbecovirus currently known to infect humans.  If clinically indicated additional testing with an alternate test  methodology [phone]) is advised. The SARS-CoV-2 RNA is generally  detectable in upper and lower respiratory sp ecimens during  the acute  phase of infection. The expected result is Negative. Fact Sheet for Patients:  StrictlyIdeas.no Fact Sheet for Healthcare Providers: BankingDealers.co.za This test is not yet approved or cleared by the [place] FDA and has been authorized for detection and/or diagnosis of SARS-CoV-2 by FDA under an Emergency Use Authorization  (EUA).  This EUA will remain in effect (meaning this test can be used) for the duration of the COVID-19 declaration under Section 564(b)(1) of the Act, 21 U.S.C. section 360bbb-3(b)(1), unless the authorization is terminated or revoked sooner. Performed at [facility], [address] [place], [place] [address]   Aerobic Culture (superficial specimen)     Status: None (Preliminary result)   Collection Time: 03/12/19  5:19 AM   Specimen: Leg; Wound  Result Value Ref Range Status   Specimen Description   Final    LEG LEFT LEG Performed at [facility], [address] [place], [place] [address]    Special Requests   Final    Immunocompromised Performed at [facility], [address] [place], [place] [address]    Gram Stain   Final    NO WBC SEEN NO ORGANISMS SEEN Performed at [facility], [place] [address] [place], [place] [address]    Culture PENDING  Incomplete   Report Status PENDING  Incomplete   Studies/Results: Ct Abdomen Pelvis Wo Contrast  Result Date: 03/10/2019 CLINICAL DATA:  Vomiting. EXAM: CT ABDOMEN AND PELVIS WITHOUT CONTRAST TECHNIQUE: Multidetector CT imaging of the abdomen and pelvis was performed following the standard protocol without IV contrast. COMPARISON:  07/13/2018 FINDINGS: Lower chest: Bibasilar atelectasis. Large hiatal hernia. Calcific atherosclerotic disease of the coronary arteries. Hepatobiliary: No focal liver abnormality is seen. No gallstones, gallbladder wall thickening, or biliary dilatation. Pancreas: Atrophic pancreas. Spleen: The spleen is surgically. Adrenals/Urinary Tract: Normal adrenal glands. Bilateral renal cysts, the largest in the midpole region of the left kidney measuring 3.1 cm. Normal urinary bladder. Stomach/Bowel: Large hiatal hernia with diffuse circumferential mucosal thickening of the gastric mucosa within the hernial sac and in the anatomic location of the stomach. No evidence of small-bowel  obstruction. Extensive colonic diverticulosis. Long segment of mucosal thickening of the ascending, transverse and sigmoid colon likely represent diverticulitis or other forms of colitis. Vascular/Lymphatic: Calcific atherosclerotic disease and severe tortuosity of the aorta. Reproductive: Uterus and bilateral adnexa are unremarkable. Other: No abdominal wall hernia or abnormality. No abdominopelvic ascites. Musculoskeletal: Osteopenia. Scoliosis. Prior vertebroplasty. Diffuse spondylosis of the lumbosacral spine. IMPRESSION: 1. Large hiatal hernia with diffuse circumferential mucosal thickening of the gastric mucosa within the hernial sac and in the anatomic location of the stomach. This may represent diffuse gastritis, however gastric malignancy cannot be entirely excluded. 2. Extensive colonic diverticulosis. Long segment of mucosal thickening of the ascending, transverse and sigmoid colon, likely representing diverticulitis or other form of colitis. 3. Calcific atherosclerotic disease of the aorta and marked tortuosity. Electronically Signed   By: Fidela Salisbury M.D.   On: 03/10/2019 19:12   US Venous Img Lower Unilateral Left  Result Date: 03/11/2019 CLINICAL DATA:  Left lower extremity edema. History of prior lower extremity DVT. EXAM: LEFT LOWER EXTREMITY VENOUS DOPPLER ULTRASOUND TECHNIQUE: Gray-scale sonography with graded compression, as well as color Doppler and duplex ultrasound were performed to evaluate the lower extremity deep venous systems from the level of the common femoral vein and including the common femoral, femoral, profunda femoral, popliteal and calf veins including the posterior tibial, peroneal and gastrocnemius veins when  visible. The superficial great saphenous vein was also interrogated. Spectral Doppler was utilized to evaluate flow at rest and with distal augmentation maneuvers in the common femoral, femoral and popliteal veins. COMPARISON:  06/11/2015 FINDINGS:  Contralateral Common Femoral Vein: Respiratory phasicity is normal and symmetric with the symptomatic side. No evidence of thrombus. Normal compressibility. Common Femoral Vein: No evidence of thrombus. Normal compressibility, respiratory phasicity and response to augmentation. Saphenofemoral Junction: No evidence of thrombus. Normal compressibility and flow on color Doppler imaging. Profunda Femoral Vein: No evidence of thrombus. Normal compressibility and flow on color Doppler imaging. Femoral Vein: No evidence of thrombus. Normal compressibility, respiratory phasicity and response to augmentation. Popliteal Vein: No evidence of thrombus. Normal compressibility, respiratory phasicity and response to augmentation. Calf Veins: No evidence of thrombus. Normal compressibility and flow on color Doppler imaging. Superficial Great Saphenous Vein: No evidence of thrombus. Normal compressibility. Venous Reflux:  None. Other Findings: No evidence of superficial thrombophlebitis or abnormal fluid collection. IMPRESSION: No evidence of left lower extremity deep venous thrombosis. Electronically Signed   By: Aletta Edouard M.D.   On: 03/11/2019 09:40   Dg Chest Port 1 View  Result Date: 03/10/2019 CLINICAL DATA:  Vomiting. Weakness. EXAM: PORTABLE CHEST 1 VIEW COMPARISON:  03/31/2015 FINDINGS: Cardiomediastinal silhouette is enlarged. Mediastinal contours appear intact. Tortuosity and calcific atherosclerotic disease of the aorta. Large hiatal hernia. There is no evidence of focal airspace consolidation, pleural effusion or pneumothorax. Osseous structures are without acute abnormality. Soft tissues are grossly normal. IMPRESSION: 1. Enlarged cardiac silhouette. 2. Large hiatal hernia. Electronically Signed   By: Fidela Salisbury M.D.   On: 03/10/2019 19:14   Medications: I have reviewed the patient's current medications. Scheduled Meds:  ciprofloxacin  500 mg Oral Q24H   [START ON 03/13/2019] fentaNYL  1 patch  Transdermal Q72H   hydroxychloroquine  200 mg Oral Daily   letrozole  2.5 mg Oral Daily   levothyroxine  112 mcg Oral Daily   lisinopril  5 mg Oral Daily   metoCLOPramide  10 mg Oral TID AC & HS   metoprolol succinate  12.5 mg Oral Daily   metroNIDAZOLE  500 mg Oral Q8H   mupirocin ointment   Nasal Daily   mycophenolate  1,000 mg Oral BID   pantoprazole  40 mg Oral BID   predniSONE  5 mg Oral Daily   sucralfate  1 g Oral QID   tadalafil (PAH)  40 mg Oral Daily   warfarin  2 mg Oral ONCE-1800   Warfarin - Pharmacist Dosing Inpatient   Does not apply q1800   Continuous Infusions:  dextrose 5 % and 0.45% NaCl Stopped (03/12/19 0220)   PRN Meds:.acetaminophen **OR** acetaminophen, ondansetron **OR** ondansetron (ZOFRAN) IV, oxyCODONE-acetaminophen, polyethylene glycol   Assessment: Active Problems:   Diverticulitis   Nausea and vomiting   Dysphasia   Food impaction of esophagus   Stricture and stenosis of esophagus    Plan: This patient was admitted with diverticulitis nausea vomiting with dysphasia.  The patient had food impaction in her esophagus and that was removed with an endoscopy yesterday.  The patient did not have dilation of the esophagus due to her acute inflammation.  The patient has been given my card and states she would like to follow-up in the future with me.  The patient will have dilation as an outpatient in the next few weeks.  The patient has been explained the plan and agrees with it.  I would recommend to continue treating the diverticulitis  with antibiotics.  I will sign off.  Please call if any further GI concerns or questions.  We would like to thank you for the opportunity to participate in the care of Diana [name].    LOS: 2 days   Diana [name] 03/12/2019, 2:58 PM

## 2019-03-13 ENCOUNTER — Encounter: Payer: Self-pay | Admitting: Internal Medicine

## 2019-03-13 ENCOUNTER — Telehealth: Payer: Self-pay | Admitting: Internal Medicine

## 2019-03-13 LAB — BASIC METABOLIC PANEL
Anion gap: 4 — ABNORMAL LOW (ref 5–15)
BUN: 18 mg/dL (ref 8–23)
CO2: 21 mmol/L — ABNORMAL LOW (ref 22–32)
Calcium: 7.6 mg/dL — ABNORMAL LOW (ref 8.9–10.3)
Chloride: 116 mmol/L — ABNORMAL HIGH (ref 98–111)
Creatinine, Ser: 1.21 mg/dL — ABNORMAL HIGH (ref 0.44–1.00)
GFR calc Af Amer: 48 mL/min — ABNORMAL LOW (ref >60)
GFR calc non Af Amer: 42 mL/min — ABNORMAL LOW (ref >60)
Glucose, Bld: 106 mg/dL — ABNORMAL HIGH (ref 70–99)
Potassium: 3.3 mmol/L — ABNORMAL LOW (ref 3.5–5.1)
Sodium: 141 mmol/L (ref 135–145)

## 2019-03-13 LAB — CBC
HCT: 28.2 % — ABNORMAL LOW (ref 36.0–46.0)
Hemoglobin: 8.1 g/dL — ABNORMAL LOW (ref 12.0–15.0)
MCH: 30.6 pg (ref 26.0–34.0)
MCHC: 28.7 g/dL — ABNORMAL LOW (ref 30.0–36.0)
MCV: 106.4 fL — ABNORMAL HIGH (ref 80.0–100.0)
Platelets: 273 10*3/uL (ref 150–400)
RBC: 2.65 MIL/uL — ABNORMAL LOW (ref 3.87–5.11)
RDW: 17.2 % — ABNORMAL HIGH (ref 11.5–15.5)
WBC: 4.3 10*3/uL (ref 4.0–10.5)
nRBC: 2.1 % — ABNORMAL HIGH (ref 0.0–0.2)

## 2019-03-13 LAB — GLUCOSE, CAPILLARY
Glucose-Capillary: 114 mg/dL — ABNORMAL HIGH (ref 70–99)
Glucose-Capillary: 124 mg/dL — ABNORMAL HIGH (ref 70–99)
Glucose-Capillary: 87 mg/dL (ref 70–99)
Glucose-Capillary: 91 mg/dL (ref 70–99)

## 2019-03-13 LAB — POCT INR: INR: 3 — AB (ref <=1.1)

## 2019-03-13 LAB — PROTIME-INR
INR: 3.1 — ABNORMAL HIGH (ref 0.8–1.2)
Prothrombin Time: 31.8 seconds — ABNORMAL HIGH (ref 11.4–15.2)

## 2019-03-13 MED ORDER — POTASSIUM & SODIUM PHOSPHATES 280-160-250 MG PO PACK
1.0000 | PACK | Freq: Three times a day (TID) | ORAL | Status: AC
  Administered 2019-03-13: 1 via ORAL
  Filled 2019-03-13: qty 1

## 2019-03-13 MED ORDER — AMOXICILLIN-POT CLAVULANATE 500-125 MG PO TABS: 1.0000 | ORAL_TABLET | Freq: Three times a day (TID) | ORAL | 0 refills | Status: DC

## 2019-03-13 MED ORDER — POTASSIUM CHLORIDE 10 MEQ/100ML IV SOLN
10.0000 meq | INTRAVENOUS | Status: AC
  Administered 2019-03-13: 10 meq via INTRAVENOUS
  Filled 2019-03-13 (×2): qty 100

## 2019-03-13 MED ORDER — WARFARIN SODIUM 2 MG PO TABS
2.0000 mg | ORAL_TABLET | Freq: Once | ORAL | Status: DC
  Filled 2019-03-13: qty 1

## 2019-03-13 MED ORDER — AMOXICILLIN-POT CLAVULANATE 500-125 MG PO TABS
1.0000 | ORAL_TABLET | Freq: Three times a day (TID) | ORAL | Status: DC
  Administered 2019-03-13: 500 mg via ORAL
  Filled 2019-03-13 (×3): qty 1

## 2019-03-13 MED ORDER — WARFARIN SODIUM 2 MG PO TABS: 2.0000 mg | ORAL_TABLET | Freq: Every day | ORAL | 0 refills | Status: DC

## 2019-03-13 MED ORDER — LEVOTHYROXINE SODIUM 112 MCG PO TABS: 112.0000 ug | ORAL_TABLET | Freq: Every day | ORAL | 0 refills | Status: DC

## 2019-03-13 NOTE — Discharge Summary (Signed)
[employer] - [place] at [facility]   PATIENT NAME: Diana [name]    MR#:  [id number]  DATE OF BIRTH:  [birth year]  DATE OF ADMISSION:  03/10/2019   ADMITTING PHYSICIAN: Hannah Beat, MD  DATE OF DISCHARGE: 03/13/19  PRIMARY CARE PHYSICIAN: Sherlene Shams, MD   ADMISSION DIAGNOSIS:  Diverticulitis [K57.92] Weakness [R53.1] Nausea and vomiting, intractability of vomiting not specified, unspecified vomiting type [R11.2] DISCHARGE DIAGNOSIS:  Active Problems:   Diverticulitis   Nausea and vomiting   Dysphasia   Food impaction of esophagus   Stricture and stenosis of esophagus  SECONDARY DIAGNOSIS:   Past Medical History:  Diagnosis Date  . Acute glomerulonephritis with other specified pathological lesion in kidney in disease classified elsewhere(580.81)   . Alveolar aeration decreased   . Anemia   . Arthritis   . Atrial fibrillation (HCC)   . Breast cancer (HCC) 03/15/202015   positive, radiation  . Cancer (HCC)    Breast  . CHF (congestive heart failure) (HCC)   . Diaphragmatic hernia   . DVT (deep venous thrombosis) (HCC)   . Dysrhythmia   . Environmental allergies   . Erosive esophagitis 03/28/2015   esophageal biopsy July 2017 c/w CMV cytopathic changes, negative for H Pylori and dysplasia     . Esophageal reflux   . Esophagitis, CMV (HCC) 05/26/2015   Diagnosed at [facility] with manometry,  Tube feeds started for enteral nutrtion 05/12/15   . Feeding problem    FEEDING TUBE  . Hiatal hernia   . Hyperlipidemia   . Hypertension   . Lower extremity edema   . Lupus (systemic lupus erythematosus) (HCC)   . Neuropathy   . Osteopenia   . Oxygen deficiency    USES HS  . Peripheral neuropathy, hereditary/idiopathic   . Personal history of radiation therapy   . Pneumonia   . Primary pulmonary hypertension (HCC)   . Pulmonary hypertension (HCC)   . Renal insufficiency   . Sciatica   . Shingles   . Shingles (herpes zoster) polyneuropathy March 2013   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  . Shortness of breath dyspnea   . Unspecified hypothyroidism   . Unspecified menopausal and postmenopausal disorder   . Urinary incontinence without sensory awareness   . Vitamin B deficiency    HOSPITAL COURSE:   Meko is an 84 year old female who presented to the ED with nausea, vomiting, left lower quadrant abdominal pain, and difficulty swallowing.  In the ED, CT abdomen pelvis showed diverticulitis in the ascending, transverse, and sigmoid colon.  Patient was also noted to have a left lower extremity wound with surrounding cellulitis. She was admitted for further management.  Acute diverticulitis- no signs of sepsis. Symptoms resolved. -Treated with IV cipro and flagyl, then transitioned to augmentin on discharge for coverage of both diverticulitis and cellulitis  Left lower extremity cellulitis- improved -LLE Doppler ultrasound negative for DVT -Discharged on Augmentin -Wound culture with no growth  Dysphagia- patient does have a history of CMV esophagitis -CT abdomen/pelvs showed diffuse circumferential mucosal thickening of the gastric mucosa within the hernial sac, cannot exclude malignancy -s/p EGD 6/22 with large hiatal hernia and benign esophageal stenosis -Needs to follow-up with GI on discharge -SLP eval pending -Continued Reglan, Carafate, and PPI   Elevated troponin- likely demand ischemia, patient denies any active chest pain. -Troponins flat  History of DVT -Continued Coumadin -Needs INR checked in 1 to 2 days  Hypothyroidism -TSH 0.031 this admission -Synthroid dose was decreased  to daily -Needs thyroid function tests repeated as an outpatient  Macrocytic anemia- may just be anemia of chronic kidney disease. Folate and Vitamin B12 were normal 10/09/18. Recent ferritin normal.  CKD III- creatine at baseline  DISCHARGE CONDITIONS:  Acute diverticulitis Left lower extremity cellulitis Dysphagia History of  DVT Hypothyroidism Macrocytic anemia CKD 3 CONSULTS OBTAINED:  Gastroenterology DRUG ALLERGIES:   Allergies  Allergen Reactions  . Iodinated Diagnostic Agents Hives    Pt states she broke out in hives years ago, has been premedicated in the past.   . Tramadol Other (See Comments) and Rash    Rash  . Amoxicillin Other (See Comments)    Reaction:  Stomach cramps   . Cefuroxime Itching  . Metrizamide Hives  . Morphine And Related     Reaction: face flushed    . Propoxyphene     Stomach cramp  . Cephalexin Rash  . Sulfa Antibiotics Rash    REACTION: Rash  . Sulfonamide Derivatives Rash  . Tramadol Hcl Rash  . Venlafaxine Rash   DISCHARGE MEDICATIONS:   Allergies as of 03/13/2019      Reactions   Iodinated Diagnostic Agents Hives   Pt states she broke out in hives years ago, has been premedicated in the past.    Tramadol Other (See Comments), Rash   Rash   Amoxicillin Other (See Comments)   Reaction:  Stomach cramps    Cefuroxime Itching   Metrizamide Hives   Morphine And Related    Reaction: face flushed    Propoxyphene    Stomach cramp   Cephalexin Rash   Sulfa Antibiotics Rash   REACTION: Rash   Sulfonamide Derivatives Rash   Tramadol Hcl Rash   Venlafaxine Rash      Medication List    STOP taking these medications   doxycycline 100 MG tablet Commonly known as: VIBRA-TABS   Lactulose 20 GM/30ML Soln   mirtazapine 15 MG tablet Commonly known as: REMERON     TAKE these medications   Adcirca 20 MG tablet Generic drug: tadalafil (PAH) Take 40 mg by mouth daily.   ALPRAZolam 0.25 MG tablet Commonly known as: XANAX Take 1 tablet (0.25 mg total) by mouth 2 (two) times daily as needed for anxiety.   amoxicillin-clavulanate 500-125 MG tablet Commonly known as: AUGMENTIN Take 1 tablet (500 mg total) by mouth 3 (three) times daily.   budesonide-formoterol 160-4.5 MCG/ACT inhaler Commonly known as: SYMBICORT Symbicort 160 mcg-4.5 mcg/actuation HFA  aerosol inhaler   Calcium Carbonate-Vitamin D 600-400 MG-UNIT tablet Take 1 tablet by mouth daily.   cyanocobalamin 1000 MCG/ML injection Commonly known as: (VITAMIN B-12) Inject 1 mL (1,000 mcg total) into the muscle every 30 (thirty) days. Pt uses on the 1st of every month.   fentaNYL 50 MCG/HR Commonly known as: DURAGESIC Place 1 patch onto the skin every 3 (three) days.   hydroxychloroquine 200 MG tablet Commonly known as: PLAQUENIL Take 200 mg by mouth daily.   letrozole 2.5 MG tablet Commonly known as: FEMARA TAKE 1 TABLET BY MOUTH EVERY DAY   levothyroxine 112 MCG tablet Commonly known as: SYNTHROID Take 1 tablet (112 mcg total) by mouth daily. Start taking on: March 14, 2019 What changed:   medication strength  how much to take   lisinopril 5 MG tablet Commonly known as: ZESTRIL TAKE 1 TABLET (5 MG TOTAL) BY MOUTH DAILY.   metoCLOPramide 10 MG tablet Commonly known as: REGLAN TAKE 1 TABLET BY MOUTH 4 TIMES DAILY - BEFORE  MEALS AND AT BEDTIME.   metoprolol succinate 25 MG 24 hr tablet Commonly known as: TOPROL-XL Take 12.5 mg by mouth daily.   mupirocin ointment 2 % Commonly known as: Bactroban Apply 1 application topically 2 (two) times daily. Apply thin layer to affected area on leg   mycophenolate 500 MG tablet Commonly known as: CELLCEPT Take 1,000 mg by mouth 2 (two) times daily.   ondansetron 4 MG disintegrating tablet Commonly known as: Zofran ODT Take 1 tablet (4 mg total) by mouth every 8 (eight) hours as needed for nausea or vomiting.   oxyCODONE-acetaminophen 5-325 MG tablet Commonly known as: PERCOCET/ROXICET Take 1 tablet by mouth 2 (two) times daily as needed for severe pain.   OXYGEN Inhale 2 mLs into the lungs at bedtime.   pantoprazole 40 MG tablet Commonly known as: PROTONIX Take 40 mg by mouth 2 (two) times daily.   predniSONE 5 MG tablet Commonly known as: DELTASONE Take 5 mg by mouth daily.   Probiotic 250 MG Caps Take  1 tablet by mouth daily.   prochlorperazine 5 MG tablet Commonly known as: COMPAZINE Take 1 tablet (5 mg total) by mouth every 6 (six) hours as needed for nausea or vomiting.   REFRESH OP Place 2 drops into both eyes daily as needed (dry eyes).   sucralfate 1 GM/10ML suspension Commonly known as: CARAFATE Take 1 g by mouth 4 (four) times daily. Before meals and nightly   SYRINGE 3CC/25GX1" 25G X 1" 3 ML Misc Use for b12 injections   vitamin C 1000 MG tablet Take 1,000 mg by mouth daily.   warfarin 2 MG tablet Commonly known as: Coumadin Take 1 tablet (2 mg total) by mouth daily for 30 days. What changed:   medication strength  how much to take  Another medication with the same name was removed. Continue taking this medication, and follow the directions you see here.        DISCHARGE INSTRUCTIONS:  1.  Follow-up with PCP in 5 days 2.  Follow-up with gastroenterology in 1 to 2 weeks 3.  Take Augmentin 500 mg 3 times daily to complete full antibiotic course 4.  Decrease Synthroid dose to 112 mcg daily DIET:  Soft diet DISCHARGE CONDITION:  Stable ACTIVITY:  Activity as tolerated OXYGEN:  Home Oxygen: No.  Oxygen Delivery: room air DISCHARGE LOCATION:  home   If you experience worsening of your admission symptoms, develop shortness of breath, life threatening emergency, suicidal or homicidal thoughts you must seek medical attention immediately by calling 911 or calling your MD immediately  if symptoms less severe.  You Must read complete instructions/literature along with all the possible adverse reactions/side effects for all the Medicines you take and that have been prescribed to you. Take any new Medicines after you have completely understood and accpet all the possible adverse reactions/side effects.   Please note  You were cared for by a hospitalist during your hospital stay. If you have any questions about your discharge medications or the care you received  while you were in the hospital after you are discharged, you can call the unit and asked to speak with the hospitalist on call if the hospitalist that took care of you is not available. Once you are discharged, your primary care physician will handle any further medical issues. Please note that NO REFILLS for any discharge medications will be authorized once you are discharged, as it is imperative that you return to your primary care physician (or establish a  relationship with a primary care physician if you do not have one) for your aftercare needs so that they can reassess your need for medications and monitor your lab values.    On the day of Discharge:  VITAL SIGNS:  Blood pressure 115/71, pulse 78, temperature (!) 97.3 F (36.3 C), temperature source Axillary, resp. rate 16, height 5\' 1"  (1.549 m), weight 47.4 kg, SpO2 96 %. PHYSICAL EXAMINATION:  GENERAL:  84 y.o.-year-old patient lying in the bed with no acute distress.  Thin appearing. EYES: Pupils equal, round, reactive to light and accommodation. No scleral icterus. Extraocular muscles intact.  HEENT: Head atraumatic, normocephalic. Oropharynx and nasopharynx clear.  NECK:  Supple, no jugular venous distention. No thyroid enlargement, no tenderness.  LUNGS: Normal breath sounds bilaterally, no wheezing, rales,rhonchi or crepitation. No use of accessory muscles of respiration.  CARDIOVASCULAR: RRR, S1, S2 normal. No murmurs, rubs, or gallops.  ABDOMEN: Soft, non-distended. Bowel sounds present. No organomegaly or mass. + Mild diffuse tenderness to palpation, no rebound or guarding. EXTREMITIES: No pedal edema, cyanosis, or clubbing.  NEUROLOGIC: Cranial nerves II through XII are intact. Muscle strength 5/5 in all extremities. Sensation intact. Gait not checked.  PSYCHIATRIC: The patient is alert and oriented x 3.  SKIN: No obvious rash, lesion, or ulcer.  DATA REVIEW:   CBC Recent Labs  Lab 03/13/19 0427  WBC 4.3  HGB 8.1*  HCT  28.2*  PLT 273    Chemistries  Recent Labs  Lab 03/10/19 1746  03/13/19 0427  NA 149*   < > 141  K 4.3   < > 3.3*  CL 119*   < > 116*  CO2 15*   < > 21*  GLUCOSE 69*   < > 106*  BUN 39*   < > 18  CREATININE 1.80*   < > 1.21*  CALCIUM 9.0   < > 7.6*  AST 26  --   --   ALT 13  --   --   ALKPHOS 129*  --   --   BILITOT 0.5  --   --    < > = values in this interval not displayed.     Microbiology Results  Results for orders placed or performed during the hospital encounter of 03/10/19  SARS Coronavirus 2 (CEPHEID - Performed in [facility] hospital lab), Hosp Order     Status: None   Collection Time: 03/10/19  7:00 PM   Specimen: Nasopharyngeal Swab  Result Value Ref Range Status   SARS Coronavirus 2 NEGATIVE NEGATIVE Final    Comment: (NOTE) If result is NEGATIVE SARS-CoV-2 target nucleic acids are NOT DETECTED. The SARS-CoV-2 RNA is generally detectable in upper and lower  respiratory specimens during the acute phase of infection. The lowest  concentration of SARS-CoV-2 viral copies this assay can detect is 250  copies / mL. A negative result does not preclude SARS-CoV-2 infection  and should not be used as the sole basis for treatment or other  patient management decisions.  A negative result may occur with  improper specimen collection / handling, submission of specimen other  than nasopharyngeal swab, presence of viral mutation(s) within the  areas targeted by this assay, and inadequate number of viral copies  (<250 copies / mL). A negative result must be combined with clinical  observations, patient history, and epidemiological information. If result is POSITIVE SARS-CoV-2 target nucleic acids are DETECTED. The SARS-CoV-2 RNA is generally detectable in upper and lower  respiratory specimens dur ing  the acute phase of infection.  Positive  results are indicative of active infection with SARS-CoV-2.  Clinical  correlation with patient history and other diagnostic  information is  necessary to determine patient infection status.  Positive results do  not rule out bacterial infection or co-infection with other viruses. If result is PRESUMPTIVE POSTIVE SARS-CoV-2 nucleic acids MAY BE PRESENT.   A presumptive positive result was obtained on the submitted specimen  and confirmed on repeat testing.  While 2019 novel coronavirus  (SARS-CoV-2) nucleic acids may be present in the submitted sample  additional confirmatory testing may be necessary for epidemiological  and / or clinical management purposes  to differentiate between  SARS-CoV-2 and other Sarbecovirus currently known to infect humans.  If clinically indicated additional testing with an alternate test  methodology [phone]) is advised. The SARS-CoV-2 RNA is generally  detectable in upper and lower respiratory sp ecimens during the acute  phase of infection. The expected result is Negative. Fact Sheet for Patients:  BoilerBrush.com.cy Fact Sheet for Healthcare Providers: https://pope.com/ This test is not yet approved or cleared by the [place] FDA and has been authorized for detection and/or diagnosis of SARS-CoV-2 by FDA under an Emergency Use Authorization (EUA).  This EUA will remain in effect (meaning this test can be used) for the duration of the COVID-19 declaration under Section 564(b)(1) of the Act, 21 U.S.C. section 360bbb-3(b)(1), unless the authorization is terminated or revoked sooner. Performed at [facility], [address] [place], [place] [address]   Aerobic Culture (superficial specimen)     Status: None (Preliminary result)   Collection Time: 03/12/19  5:19 AM   Specimen: Leg; Wound  Result Value Ref Range Status   Specimen Description   Final    LEG LEFT LEG Performed at [facility], [address] [place], [place] [address]    Special Requests   Final    Immunocompromised Performed at  [facility], [address] [place], [place] [address]    Gram Stain   Final    NO WBC SEEN NO ORGANISMS SEEN Performed at [facility], [address] [place], [place] [address]    Culture PENDING  Incomplete   Report Status PENDING  Incomplete    RADIOLOGY:  No results found.   Management plans discussed with the patient, family and they are in agreement.  CODE STATUS: Full Code   TOTAL TIME TAKING CARE OF THIS PATIENT: 45 minutes.    Jinny Blossom Kaimana Lurz M.D on 03/13/2019 at 9:10 AM  Between 7am to 6pm - Pager - [phone]  After 6pm go to [email or web address] - Social research officer, government  [employer]  Hospitalists  Office  [phone]  CC: Primary care physician; Sherlene Shams, MD   Note: This dictation was prepared with Dragon dictation along with smaller phrase technology. Any transcriptional errors that result from this process are unintentional.

## 2019-03-13 NOTE — Telephone Encounter (Signed)
HFU/ Pt was Dx with Diverticulitis. Pt will be discharged today to home from [facility]/ Pt is scheduled for 06/26 nurse stated that pt should be seen in 2 days to have INR checked. Pt needs to know if she will need to come in or appt will be virtual.   Thank you!

## 2019-03-13 NOTE — Progress Notes (Signed)
Nsg Discharge Note  Admit Date:  03/10/2019 Discharge date: 03/13/2019   Diana [name] to be D/C'd Home per MD order.  AVS completed.  Copy for chart, and copy for patient signed, and dated. Patient/caregiver able to verbalize understanding.  Discharge Medication: Allergies as of 03/13/2019      Reactions   Iodinated Diagnostic Agents Hives   Pt states she broke out in hives years ago, has been premedicated in the past.    Tramadol Other (See Comments), Rash   Rash   Amoxicillin Other (See Comments)   Reaction:  Stomach cramps    Cefuroxime Itching   Metrizamide Hives   Morphine And Related    Reaction: face flushed    Propoxyphene    Stomach cramp   Cephalexin Rash   Sulfa Antibiotics Rash   REACTION: Rash   Sulfonamide Derivatives Rash   Tramadol Hcl Rash   Venlafaxine Rash      Medication List    STOP taking these medications   doxycycline 100 MG tablet Commonly known as: VIBRA-TABS   Lactulose 20 GM/30ML Soln   mirtazapine 15 MG tablet Commonly known as: REMERON     TAKE these medications   Adcirca 20 MG tablet Generic drug: tadalafil (PAH) Take 40 mg by mouth daily.   ALPRAZolam 0.25 MG tablet Commonly known as: XANAX Take 1 tablet (0.25 mg total) by mouth 2 (two) times daily as needed for anxiety.   amoxicillin-clavulanate 500-125 MG tablet Commonly known as: AUGMENTIN Take 1 tablet (500 mg total) by mouth 3 (three) times daily.   budesonide-formoterol 160-4.5 MCG/ACT inhaler Commonly known as: SYMBICORT Symbicort 160 mcg-4.5 mcg/actuation HFA aerosol inhaler   Calcium Carbonate-Vitamin D 600-400 MG-UNIT tablet Take 1 tablet by mouth daily.   cyanocobalamin 1000 MCG/ML injection Commonly known as: (VITAMIN B-12) Inject 1 mL (1,000 mcg total) into the muscle every 30 (thirty) days. Pt uses on the 1st of every month.   fentaNYL 50 MCG/HR Commonly known as: [place] 1 patch onto the skin every 3 (three) days.   hydroxychloroquine 200 MG  tablet Commonly known as: PLAQUENIL Take 200 mg by mouth daily.   letrozole 2.5 MG tablet Commonly known as: FEMARA TAKE 1 TABLET BY MOUTH EVERY DAY   levothyroxine 112 MCG tablet Commonly known as: SYNTHROID Take 1 tablet (112 mcg total) by mouth daily. Start taking on: March 14, 2019 What changed:   medication strength  how much to take   lisinopril 5 MG tablet Commonly known as: ZESTRIL TAKE 1 TABLET (5 MG TOTAL) BY MOUTH DAILY.   metoCLOPramide 10 MG tablet Commonly known as: REGLAN TAKE 1 TABLET BY MOUTH 4 TIMES DAILY - BEFORE MEALS AND AT BEDTIME.   metoprolol succinate 25 MG 24 hr tablet Commonly known as: TOPROL-XL Take 12.5 mg by mouth daily.   mupirocin ointment 2 % Commonly known as: Bactroban Apply 1 application topically 2 (two) times daily. Apply thin layer to affected area on leg   mycophenolate 500 MG tablet Commonly known as: CELLCEPT Take 1,000 mg by mouth 2 (two) times daily.   ondansetron 4 MG disintegrating tablet Commonly known as: Zofran ODT Take 1 tablet (4 mg total) by mouth every 8 (eight) hours as needed for nausea or vomiting. Notes to patient: Last dose given today at 8:20am   oxyCODONE-acetaminophen 5-325 MG tablet Commonly known as: PERCOCET/ROXICET Take 1 tablet by mouth 2 (two) times daily as needed for severe pain.   OXYGEN Inhale 2 mLs into the lungs at  bedtime.   pantoprazole 40 MG tablet Commonly known as: PROTONIX Take 40 mg by mouth 2 (two) times daily.   predniSONE 5 MG tablet Commonly known as: DELTASONE Take 5 mg by mouth daily.   Probiotic 250 MG Caps Take 1 tablet by mouth daily.   prochlorperazine 5 MG tablet Commonly known as: COMPAZINE Take 1 tablet (5 mg total) by mouth every 6 (six) hours as needed for nausea or vomiting.   REFRESH OP Place 2 drops into both eyes daily as needed (dry eyes).   sucralfate 1 GM/10ML suspension Commonly known as: CARAFATE Take 1 g by mouth 4 (four) times daily. Before  meals and nightly   SYRINGE 3CC/25GX1" 25G X 1" 3 ML Misc Use for b12 injections   vitamin C 1000 MG tablet Take 1,000 mg by mouth daily.   warfarin 2 MG tablet Commonly known as: Coumadin Take 1 tablet (2 mg total) by mouth daily for 30 days. What changed:   medication strength  how much to take  Another medication with the same name was removed. Continue taking this medication, and follow the directions you see here.       Discharge Assessment: Vitals:   03/12/19 1937 03/13/19 0631  BP: 102/64 115/71  Pulse: 69 78  Resp: 16 16  Temp: 98.1 F (36.7 C) (!) 97.3 F (36.3 C)  SpO2: 98% 96%   Skin clean, dry and intact without evidence of skin break down, no evidence of skin tears noted. IV catheter discontinued intact. Site without signs and symptoms of complications - no redness or edema noted at insertion site, patient denies c/o pain - only slight tenderness at site.  Dressing with slight pressure applied.  D/c Instructions-Education: Discharge instructions given to patient/family with verbalized understanding. D/c education completed with patient/family including follow up instructions, medication list, d/c activities limitations if indicated, with other d/c instructions as indicated by MD - patient able to verbalize understanding, all questions fully answered. Patient instructed to return to ED, call 911, or call MD for any changes in condition.  Patient escorted via [place], and D/C home via private auto.  Eda Keys, RN 03/13/2019 2:01 PM

## 2019-03-13 NOTE — Progress Notes (Signed)
Fentanyl patch removed from pt. Originally placed at home.Winessed Surveyor, quantity by [employer]

## 2019-03-13 NOTE — Care Management Important Message (Signed)
Important Message  Patient Details  Name: Diana [name] MRN: [id number] Date of Birth: [birth year]   Medicare Important Message Given:  Yes     Dannette Barbara 03/13/2019, 10:57 AM

## 2019-03-13 NOTE — Progress Notes (Signed)
[place] for warfarin Indication: atrial fibrillation and DVT (hx of)  Allergies  Allergen Reactions  . Iodinated Diagnostic Agents Hives    Pt states she broke out in hives years ago, has been premedicated in the past.   . Tramadol Other (See Comments) and Rash    Rash  . Amoxicillin Other (See Comments)    Reaction:  Stomach cramps   . Cefuroxime Itching  . Metrizamide Hives  . Morphine And Related     Reaction: face flushed    . Propoxyphene     Stomach cramp  . Cephalexin Rash  . Sulfa Antibiotics Rash    REACTION: Rash  . Sulfonamide Derivatives Rash  . Tramadol Hcl Rash  . Venlafaxine Rash    Patient Measurements: Height: 5\' 1"  (154.9 cm) Weight: 104 lb 8 oz (47.4 kg) IBW/kg (Calculated) : 47.8  Vital Signs: Temp: 97.3 F (36.3 C) (06/24 0631) Temp Source: Axillary (06/24 0631) BP: 115/71 (06/24 0631) Pulse Rate: 78 (06/24 0631)  Labs: Recent Labs    03/11/19 0450  03/11/19 1525 03/11/19 2045 03/12/19 0305 03/13/19 0427  HGB 8.7*  --   --   --  8.1* 8.1*  HCT 30.6*  --   --   --  28.0* 28.2*  PLT 312  --   --   --  279 273  LABPROT 27.8*  --   --   --  32.2* 31.8*  INR 2.6*  --   --   --  3.2* 3.1*  CREATININE 1.74*  --   --   --  1.52* 1.21*  TROPONINI 0.11*   < > 0.10* 0.11* 0.09*  --    < > = values in this interval not displayed.    Estimated Creatinine Clearance: 26.8 mL/min (A) (by C-G formula based on SCr of 1.21 mg/dL (H)).  Assessment: 84 yo female on warfarin PTA now started on Cipro/Flagyl (significant drug interaction). Home dose noted to be warfarin 3 mg Sun, Mon, Wed, Thurs, Fridays; does NOT take warfarin on Tuesdays and Saturdays.   Last dose pta 3mg  6/19 @ unknown time per pt report - no doses given since admission.             INR    Warfarin Dose 6/21  2.3 held 6/22 2.6 held 6/23 3.2 Will hold 6/24     3.1       Will give 2mg   Goal of Therapy:  INR 2-3 Monitor platelets by  anticoagulation protocol: Yes   Plan:  Will resume warfarin for tonight at a slightly lower dose (2mg ) than home schedule (3mg ) due to significant DDI with antibiotics.   Daily INRs. CBC at least every three days. CBC ordered for AM by MD (Hgb stable 10.5>8.7>8.1>8.1).   Pharmacy will continue to follow.   Lu Duffel, PharmD, BCPS Clinical Pharmacist 03/13/2019 7:23 AM

## 2019-03-13 NOTE — Discharge Instructions (Signed)
It was so nice to meet you during this hospitalization!  You came into the hospital with abdominal pain. We found that you had diverticulitis. You had an EGD done that showed some narrowing of your esophagus. You will need to have your esophagus opened up in the next couple of weeks.  I have prescribed the following medications: 1. Augmentin- this is an antibiotic for both the diverticulitis and the skin infection of your leg. Please take 1 tablet three times a day for 3 more days 2. Your INR was high when you were here. Please take Coumadin 2mg  daily and make sure you have your INR checked in the next 1-2 days 3. Your thyroid labs came back abnormal, so we have decreased your Synthroid dose to 178mcg daily. Your primary care doctor should recheck your thyroid labs in the next couple of weeks  Take care, Dr. Brett Albino

## 2019-03-14 LAB — AEROBIC CULTURE W GRAM STAIN (SUPERFICIAL SPECIMEN)
Culture: NO GROWTH
Gram Stain: NONE SEEN

## 2019-03-14 NOTE — Telephone Encounter (Signed)
She checks inr at home and should do that Friday morning prior to virtual visit

## 2019-03-14 NOTE — Telephone Encounter (Signed)
Transition Care Management Follow-up Telephone Call  How have you been since you were released from the hospital? Patient feeling really weak mainly eating liquid diet. .   Do you understand why you were in the hospital? yes   Do you understand the discharge instrcutions? yes  Items Reviewed:  Medications reviewed: yes  Allergies reviewed: yes  Dietary changes reviewed: yes  Referrals reviewed: yes   Functional Questionnaire:   Activities of Daily Living (ADLs):   She states they are independent in the following: ambulation, bathing and hygiene, feeding, continence, grooming, toileting and dressing States they require assistance with the following: patient says no assistance needed.   Any transportation issues/concerns?: no   Any patient concerns? no   Confirmed importance and date/time of follow-up visits scheduled: yes   Confirmed with patient if condition begins to worsen call PCP or go to the ER.  Patient was given the Call-a-Nurse line [phone]: yes

## 2019-03-15 ENCOUNTER — Telehealth: Payer: Self-pay

## 2019-03-15 ENCOUNTER — Other Ambulatory Visit: Payer: Self-pay

## 2019-03-15 ENCOUNTER — Ambulatory Visit (INDEPENDENT_AMBULATORY_CARE_PROVIDER_SITE_OTHER): Payer: Medicare Other | Admitting: Internal Medicine

## 2019-03-15 ENCOUNTER — Encounter: Payer: Self-pay | Admitting: Internal Medicine

## 2019-03-15 DIAGNOSIS — E039 Hypothyroidism, unspecified: Secondary | ICD-10-CM | POA: Diagnosis not present

## 2019-03-15 DIAGNOSIS — Z09 Encounter for follow-up examination after completed treatment for conditions other than malignant neoplasm: Secondary | ICD-10-CM | POA: Diagnosis not present

## 2019-03-15 DIAGNOSIS — Z5181 Encounter for therapeutic drug level monitoring: Secondary | ICD-10-CM | POA: Diagnosis not present

## 2019-03-15 DIAGNOSIS — I82509 Chronic embolism and thrombosis of unspecified deep veins of unspecified lower extremity: Secondary | ICD-10-CM

## 2019-03-15 DIAGNOSIS — K5792 Diverticulitis of intestine, part unspecified, without perforation or abscess without bleeding: Secondary | ICD-10-CM

## 2019-03-15 DIAGNOSIS — T18128S Food in esophagus causing other injury, sequela: Secondary | ICD-10-CM | POA: Diagnosis not present

## 2019-03-15 DIAGNOSIS — R531 Weakness: Secondary | ICD-10-CM | POA: Diagnosis not present

## 2019-03-15 DIAGNOSIS — Z7901 Long term (current) use of anticoagulants: Secondary | ICD-10-CM

## 2019-03-15 MED ORDER — LOSARTAN POTASSIUM 25 MG PO TABS: 25.0000 mg | ORAL_TABLET | Freq: Every day | ORAL | 0 refills | Status: DC

## 2019-03-15 MED ORDER — FENTANYL 50 MCG/HR TD PT72: 1.0000 | MEDICATED_PATCH | TRANSDERMAL | 0 refills | Status: DC

## 2019-03-15 NOTE — Telephone Encounter (Signed)
Patient's INR results from [place] on 03/13/19 is 3.0.

## 2019-03-15 NOTE — Telephone Encounter (Signed)
Coumadin level is therapeutic ON 2 MG DAILY   Continue current regimen and repeat PT/INR  NEXT Tuesday

## 2019-03-15 NOTE — Progress Notes (Signed)
Telepjome  Note  This visit type was conducted due to national recommendations for restrictions regarding the COVID-19 pandemic (e.g. social distancing).  This format is felt to be most appropriate for this patient at this time.  All issues noted in this document were discussed and addressed.  No physical exam was performed (except for noted visual exam findings with Video Visits).   I connected with@ on 03/15/19 at 11:00 AM EDT by  telephone and verified that I am speaking with the correct person using two identifiers. Location patient: home Location provider: work or home office Persons participating in the virtual visit: patient, provider  I discussed the limitations, risks, security and privacy concerns of performing an evaluation and management service by telephone and the availability of in person appointments. I also discussed with the patient that there may be a patient responsible charge related to this service. The patient expressed understanding and agreed to proceed.  Reason for visit: hospital follow up   HPI:  Admitted to [facility] on June 21 with n/v and abd pain.  She was diagnosed with pan diverticulitis by CT scan  And Food impaction of  Esophagus.  An EGD was done:  Large HH, and benign stricture  She was also treated for cellulitis of a Left LE wound :  cultures neg,  augmentin  Prescribed at discharged, still has  3 more days  THYROID STILL OVERACTIVE DESPITE TAKING A LOWER DOSE. OF 125 SINCE MID MAY.   NOW TAKING 112 MCG  At 5 pm    Coumadin dose reduced to 2 mg  INR. 3.2 to 3.1,  Now 3.0 today per home check   She was discharged home on June 24 after PT evaluation confirmed that she did not need rehab or home PT   Feels weak,  But feels her strength is improving;  She denies any recent falls and is not using a walker.   Tolerating  Protein drinks and soft food without odynophagia or nausea.  . Has appt with Allen Norris on Jul 13 ,  Taking reglan carafate and pantoprazole  Stools  have been  semi formed and occurring post prandially   All procedures and imaging studies reviewed  With patient /   Lab Results  Component Value Date   TSH 0.031 (L) 03/10/2019      ROS: See pertinent positives and negatives per HPI.  Past Medical History:  Diagnosis Date  . Acute glomerulonephritis with other specified pathological lesion in kidney in disease classified elsewhere(580.81)   . Alveolar aeration decreased   . Anemia   . Arthritis   . Atrial fibrillation ([place])   . Breast cancer ([place]) 06/03/2014   positive, radiation  . Cancer (HCC)    Breast  . CHF (congestive heart failure) ([place])   . Diaphragmatic hernia   . DVT (deep venous thrombosis) ([place])   . Dysrhythmia   . Environmental allergies   . Erosive esophagitis 03/28/2015   esophageal biopsy July 2017 c/w CMV cytopathic changes, negative for H Pylori and dysplasia     . Esophageal reflux   . Esophagitis, CMV ([place]) 05/26/2015   Diagnosed at [facility] with manometry,  Tube feeds started for enteral nutrtion 05/12/15   . Feeding problem    FEEDING TUBE  . Hiatal hernia   . Hyperlipidemia   . Hypertension   . Lower extremity edema   . Lupus (systemic lupus erythematosus) ([place])   . Neuropathy   . Osteopenia   . Oxygen deficiency  USES HS  . Peripheral neuropathy, hereditary/idiopathic   . Personal history of radiation therapy   . Pneumonia   . Primary pulmonary hypertension ([place])   . Pulmonary hypertension ([place])   . Renal insufficiency   . Sciatica   . Shingles   . Shingles (herpes zoster) polyneuropathy March 2013   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  . Shortness of breath dyspnea   . Unspecified hypothyroidism   . Unspecified menopausal and postmenopausal disorder   . Urinary incontinence without sensory awareness   . Vitamin B deficiency     Past Surgical History:  Procedure Laterality Date  . BREAST BIOPSY Left 2015   invasive mammary carcinoma  . BREAST LUMPECTOMY Left 2015   invasive mammary  carcinoma clear margins but close  . BREAST SURGERY    . CATARACT EXTRACTION W/PHACO Right 04/05/2016   Procedure: CATARACT EXTRACTION PHACO AND INTRAOCULAR LENS PLACEMENT (IOC);  Surgeon: Birder Robson, MD;  Location: [facility] ORS;  Service: Ophthalmology;  Laterality: Right;  [place] 01:20 AP% 23.9 CDE 19.29 fluid pack lot # [id number]  . CATARACT EXTRACTION W/PHACO Left 04/26/2016   Procedure: CATARACT EXTRACTION PHACO AND INTRAOCULAR LENS PLACEMENT ([place]);  Surgeon: Birder Robson, MD;  Location: [facility] ORS;  Service: Ophthalmology;  Laterality: Left;   [place] 00:59 AP% 23.2 CDE 13.83 Fluid pack lot # [id number]  . COLONOSCOPY WITH PROPOFOL N/A 06/04/2018   Procedure: COLONOSCOPY WITH PROPOFOL;  Surgeon: Lollie Sails, MD;  Location: [facility] ENDOSCOPY;  Service: Endoscopy;  Laterality: N/A;  . DIAGNOSTIC LAPAROSCOPY    . ESOPHAGOGASTRODUODENOSCOPY N/A 07/19/2015   Procedure: ESOPHAGOGASTRODUODENOSCOPY (EGD);  Surgeon: Hulen Luster, MD;  Location: [facility] ENDOSCOPY;  Service: Gastroenterology;  Laterality: N/A;  . ESOPHAGOGASTRODUODENOSCOPY (EGD) WITH PROPOFOL N/A 03/28/2015   Procedure: ESOPHAGOGASTRODUODENOSCOPY (EGD) WITH PROPOFOL;  Surgeon: Robert Bellow, MD;  Location: [facility] ENDOSCOPY;  Service: Endoscopy;  Laterality: N/A;  . ESOPHAGOGASTRODUODENOSCOPY (EGD) WITH PROPOFOL N/A 06/10/2015   Procedure: ESOPHAGOGASTRODUODENOSCOPY (EGD) WITH PROPOFOL;  Surgeon: Lollie Sails, MD;  Location: [facility] ENDOSCOPY;  Service: Endoscopy;  Laterality: N/A;  . ESOPHAGOGASTRODUODENOSCOPY (EGD) WITH PROPOFOL N/A 10/20/2015   Procedure: ESOPHAGOGASTRODUODENOSCOPY (EGD) WITH PROPOFOL;  Surgeon: Lollie Sails, MD;  Location: [facility] ENDOSCOPY;  Service: Endoscopy;  Laterality: N/A;  . ESOPHAGOGASTRODUODENOSCOPY (EGD) WITH PROPOFOL N/A 02/23/2016   Procedure: ESOPHAGOGASTRODUODENOSCOPY (EGD) WITH PROPOFOL;  Surgeon: Lollie Sails, MD;  Location: [facility] ENDOSCOPY;  Service: Endoscopy;  Laterality: N/A;  .  ESOPHAGOGASTRODUODENOSCOPY (EGD) WITH PROPOFOL N/A 06/30/2016   Procedure: ESOPHAGOGASTRODUODENOSCOPY (EGD) WITH PROPOFOL;  Surgeon: Lollie Sails, MD;  Location: [facility] ENDOSCOPY;  Service: Endoscopy;  Laterality: N/A;  . ESOPHAGOGASTRODUODENOSCOPY (EGD) WITH PROPOFOL N/A 01/05/2017   Procedure: ESOPHAGOGASTRODUODENOSCOPY (EGD) WITH PROPOFOL;  Surgeon: Lollie Sails, MD;  Location: [facility] ENDOSCOPY;  Service: Endoscopy;  Laterality: N/A;  . ESOPHAGOGASTRODUODENOSCOPY (EGD) WITH PROPOFOL N/A 03/11/2019   Procedure: ESOPHAGOGASTRODUODENOSCOPY (EGD) WITH PROPOFOL;  Surgeon: [name], MD;  Location: [facility] ENDOSCOPY;  Service: Endoscopy;  Laterality: N/A;  . KYPHOPLASTY N/A 02/10/2015   Procedure: FIEPPIRJJOA/C1;  Surgeon: Hessie Knows, MD;  Location: [facility] ORS;  Service: Orthopedics;  Laterality: N/A;  . LAPAROTOMY N/A 03/11/2015   Procedure: REDUCTION OF GASTRIC VOLVULUS, SPLEENECTOMY, GASTRIC TUBE PLACEMENT;  Surgeon: Robert Bellow, MD;  Location: [facility] ORS;  Service: General;  Laterality: N/A;  . MOHS SURGERY    . PERIPHERAL VASCULAR CATHETERIZATION N/A 06/12/2015   Procedure: IVC Filter Insertion;  Surgeon: Algernon Huxley, MD;  Location: [place] CV LAB;  Service: Cardiovascular;  Laterality: N/A;  .  PERIPHERAL VASCULAR CATHETERIZATION N/A 08/23/2016   Procedure: IVC Filter Removal;  Surgeon: Katha Cabal, MD;  Location: [place] CV LAB;  Service: Cardiovascular;  Laterality: N/A;  . STOMACH SURGERY     for volvolus    Family History  Problem Relation Age of Onset  . Colon cancer Mother   . Cirrhosis Brother        alcoholic  . Alcohol abuse Sister   . Breast cancer Sister 51  . Cancer Brother   . Lung cancer Son   . Meniere's disease Son   . Cirrhosis Brother        nonalcoholic     SOCIAL HX:  Social History   Tobacco Use  . Smoking status: Former Smoker    Quit date: 05/25/1991    Years since quitting: 27.8  . Smokeless tobacco: Never Used  Substance Use  Topics  . Alcohol use: No    Frequency: Never    Comment: rare  . Drug use: No     Current Outpatient Medications:  .  ALPRAZolam (XANAX) 0.25 MG tablet, Take 1 tablet (0.25 mg total) by mouth 2 (two) times daily as needed for anxiety., Disp: 20 tablet, Rfl: 5 .  amoxicillin-clavulanate (AUGMENTIN) 500-125 MG tablet, Take 1 tablet (500 mg total) by mouth 3 (three) times daily., Disp: 10 tablet, Rfl: 0 .  Ascorbic Acid (VITAMIN C) 1000 MG tablet, Take 1,000 mg by mouth daily., Disp: , Rfl:  .  budesonide-formoterol (SYMBICORT) 160-4.5 MCG/ACT inhaler, Symbicort 160 mcg-4.5 mcg/actuation HFA aerosol inhaler, Disp: , Rfl:  .  Calcium Carbonate-Vitamin D 600-400 MG-UNIT tablet, Take 1 tablet by mouth daily., Disp: , Rfl:  .  cyanocobalamin (,VITAMIN B-12,) 1000 MCG/ML injection, Inject 1 mL (1,000 mcg total) into the muscle every 30 (thirty) days. Pt uses on the 1st of every month., Disp: 10 mL, Rfl: 1 .  [START ON 03/21/2019] fentaNYL (DURAGESIC) 50 MCG/HR, Place 1 patch onto the skin every 3 (three) days., Disp: 10 patch, Rfl: 0 .  hydroxychloroquine (PLAQUENIL) 200 MG tablet, Take 200 mg by mouth daily., Disp: , Rfl:  .  letrozole (FEMARA) 2.5 MG tablet, TAKE 1 TABLET BY MOUTH EVERY DAY, Disp: 90 tablet, Rfl: 1 .  levothyroxine (SYNTHROID) 112 MCG tablet, Take 1 tablet (112 mcg total) by mouth daily., Disp: 30 tablet, Rfl: 0 .  metoCLOPramide (REGLAN) 10 MG tablet, TAKE 1 TABLET BY MOUTH 4 TIMES DAILY - BEFORE MEALS AND AT BEDTIME., Disp: 360 tablet, Rfl: 0 .  metoprolol succinate (TOPROL-XL) 25 MG 24 hr tablet, Take 12.5 mg by mouth daily. , Disp: , Rfl:  .  mupirocin ointment (BACTROBAN) 2 %, Apply 1 application topically 2 (two) times daily. Apply thin layer to affected area on leg, Disp: 22 g, Rfl: 0 .  mycophenolate (CELLCEPT) 500 MG tablet, Take 1,000 mg by mouth 2 (two) times daily., Disp: , Rfl:  .  ondansetron (ZOFRAN ODT) 4 MG disintegrating tablet, Take 1 tablet (4 mg total) by mouth  every 8 (eight) hours as needed for nausea or vomiting., Disp: 20 tablet, Rfl: 0 .  oxyCODONE-acetaminophen (PERCOCET/ROXICET) 5-325 MG tablet, Take 1 tablet by mouth 2 (two) times daily as needed for severe pain., Disp: 60 tablet, Rfl: 0 .  OXYGEN, Inhale 2 mLs into the lungs at bedtime., Disp: , Rfl:  .  pantoprazole (PROTONIX) 40 MG tablet, Take 40 mg by mouth 2 (two) times daily., Disp: , Rfl:  .  Polyvinyl Alcohol-Povidone (REFRESH OP), Place 2 drops  into both eyes daily as needed (dry eyes). , Disp: , Rfl:  .  predniSONE (DELTASONE) 5 MG tablet, Take 5 mg by mouth daily. , Disp: , Rfl:  .  prochlorperazine (COMPAZINE) 5 MG tablet, Take 1 tablet (5 mg total) by mouth every 6 (six) hours as needed for nausea or vomiting., Disp: 30 tablet, Rfl: 5 .  Saccharomyces boulardii (PROBIOTIC) 250 MG CAPS, Take 1 tablet by mouth daily., Disp: 14 capsule, Rfl:  .  sucralfate (CARAFATE) 1 GM/10ML suspension, Take 1 g by mouth 4 (four) times daily. Before meals and nightly, Disp: , Rfl:  .  Syringe/Needle, Disp, (SYRINGE 3CC/25GX1") 25G X 1" 3 ML MISC, Use for b12 injections, Disp: 50 each, Rfl: 0 .  Tadalafil, PAH, (ADCIRCA) 20 MG TABS, Take 40 mg by mouth daily., Disp: , Rfl:  .  warfarin (COUMADIN) 2 MG tablet, Take 1 tablet (2 mg total) by mouth daily for 30 days., Disp: 30 tablet, Rfl: 0 .  losartan (COZAAR) 25 MG tablet, Take 1 tablet (25 mg total) by mouth at bedtime., Disp: 30 tablet, Rfl: 0 No current facility-administered medications for this visit.   Facility-Administered Medications Ordered in Other Visits:  .  Darbepoetin Alfa (ARANESP) injection 300 mcg, 300 mcg, Subcutaneous, Weekly, Sindy Guadeloupe, MD, 300 mcg at 02/04/19 1512 .  Darbepoetin Alfa (ARANESP) injection 300 mcg, 300 mcg, Subcutaneous, Weekly, Sindy Guadeloupe, MD, 300 mcg at 02/25/19 1455  EXAM:   General impression: alert, cooperative and articulate.  No signs of being in distress  Lungs: speech is fluent sentence length  suggests that patient is not short of breath and not punctuated by cough, sneezing or sniffing. Marland Kitchen   Psych: affect normal.  speech is articulate and non pressured .  Denies suicidal thoughts    ASSESSMENT AND PLAN:   Anticoagulation goal of INR 2 to 3 INR is 3.0 on 2 mg daily  While on antibiotics.  continue 2 mg daily and repeat in one week   Chronic venous embolism and thrombosis of deep vessels of lower extremity ([place]) Will continue lifelong anticoagulation   Diverticulitis Symptoms resolving with current treatment   Food impaction of esophagus Secondary to benign stricture found during Therapeutic/diagnositc EGD .  Continue soft food,  PPI , reglan and carafate.  Follow up with [facility] July 13   Generalized weakness Recurrent secondary to acute illness,  Improving on her own   Hospital discharge follow-up . Patient is stable post discharge (June 24 ) and has no new issues or questions about discharge plans at the visit today for hospital follow up. All labs , imaging studies and progress notes from admission were reviewed with patient today      I discussed the assessment and treatment plan with the patient. The patient was provided an opportunity to ask questions and all were answered. The patient agreed with the plan and demonstrated an understanding of the instructions.   The patient was advised to call back or seek an in-person evaluation if the symptoms worsen or if the condition fails to improve as anticipated.  I provided 30 minutes of non-face-to-face time during this encounter.   Crecencio Mc, MD

## 2019-03-15 NOTE — Patient Instructions (Addendum)
CHECK YOUR INR ON Tuesday AND CONTINUE 2 MG DAILY OF COUMADIN    I am making a decision to change your lisinopril tolosartan, based on increased reports by one of my ENT colleagues of patients  developing tongue and throat swelling from lisinopril.  The condition , called "angioedema," can be fatal if a person's airway is compromised.  I also want you to take it at night instead of morning,  as recent studies have shown that taking your blood pressure medications at night protects you better from heart attacks and strokes.   Please resume your daily probiotic for 3 weeks  to prevent a serious antibiotic associated diarrhea  Called clostridium dificile colitis  Your fentanyl patch can be refilled on March 21 2019  We will recheck your thyroid level prior to your next visit (1 month)  See you in one month

## 2019-03-15 NOTE — Telephone Encounter (Signed)
She checked this on 03/14/19 was 3.0

## 2019-03-15 NOTE — Telephone Encounter (Signed)
Patient aware of results and instructed to repeat PT/INR next Tuesday per PCP's notes.

## 2019-03-15 NOTE — Progress Notes (Signed)
Patient is taking Lisinopril.  INR results from 03/13/19 is 3.0.

## 2019-03-17 NOTE — Assessment & Plan Note (Signed)
INR is 3.0 on 2 mg daily  While on antibiotics.  continue 2 mg daily and repeat in one week

## 2019-03-17 NOTE — Assessment & Plan Note (Signed)
Will continue lifelong anticoagulation

## 2019-03-17 NOTE — Assessment & Plan Note (Signed)
.   Patient is stable post discharge (June 24 ) and has no new issues or questions about discharge plans at the visit today for hospital follow up. All labs , imaging studies and progress notes from admission were reviewed with patient today

## 2019-03-17 NOTE — Assessment & Plan Note (Addendum)
Secondary to benign stricture found during Therapeutic/diagnositc EGD .  Continue soft food,  PPI , reglan and carafate.  Follow up with [facility] July 13

## 2019-03-17 NOTE — Assessment & Plan Note (Signed)
Symptoms resolving with current treatment

## 2019-03-17 NOTE — Assessment & Plan Note (Signed)
Recurrent secondary to acute illness,  Improving on her own

## 2019-03-18 ENCOUNTER — Other Ambulatory Visit: Payer: Self-pay

## 2019-03-18 ENCOUNTER — Inpatient Hospital Stay: Payer: Medicare Other

## 2019-03-18 ENCOUNTER — Other Ambulatory Visit
Admission: RE | Admit: 2019-03-18 | Discharge: 2019-03-18 | Disposition: A | Discharge status: Home / Self Care | Payer: Medicare Other | Source: Ambulatory Visit | Attending: Internal Medicine | Admitting: Internal Medicine

## 2019-03-18 VITALS — BP 91/51 | HR 80

## 2019-03-18 DIAGNOSIS — D631 Anemia in chronic kidney disease: Secondary | ICD-10-CM | POA: Diagnosis not present

## 2019-03-18 DIAGNOSIS — N189 Chronic kidney disease, unspecified: Secondary | ICD-10-CM | POA: Diagnosis not present

## 2019-03-18 DIAGNOSIS — D508 Other iron deficiency anemias: Secondary | ICD-10-CM

## 2019-03-18 DIAGNOSIS — M3214 Glomerular disease in systemic lupus erythematosus: Secondary | ICD-10-CM | POA: Insufficient documentation

## 2019-03-18 LAB — CBC WITH DIFFERENTIAL/PLATELET
Abs Immature Granulocytes: 0.06 10*3/uL (ref 0.00–0.07)
Basophils Absolute: 0.1 10*3/uL (ref 0.0–0.1)
Basophils Relative: 1 %
Eosinophils Absolute: 0.1 10*3/uL (ref 0.0–0.5)
Eosinophils Relative: 1 %
HCT: 31.3 % — ABNORMAL LOW (ref 36.0–46.0)
Hemoglobin: 9.3 g/dL — ABNORMAL LOW (ref 12.0–15.0)
Immature Granulocytes: 1 %
Lymphocytes Relative: 9 %
Lymphs Abs: 0.5 10*3/uL — ABNORMAL LOW (ref 0.7–4.0)
MCH: 30.8 pg (ref 26.0–34.0)
MCHC: 29.7 g/dL — ABNORMAL LOW (ref 30.0–36.0)
MCV: 103.6 fL — ABNORMAL HIGH (ref 80.0–100.0)
Monocytes Absolute: 0.5 10*3/uL (ref 0.1–1.0)
Monocytes Relative: 9 %
Neutro Abs: 4.4 10*3/uL (ref 1.7–7.7)
Neutrophils Relative %: 79 %
Platelets: 308 10*3/uL (ref 150–400)
RBC: 3.02 MIL/uL — ABNORMAL LOW (ref 3.87–5.11)
RDW: 17.4 % — ABNORMAL HIGH (ref 11.5–15.5)
WBC: 5.6 10*3/uL (ref 4.0–10.5)
nRBC: 0 % (ref 0.0–0.2)

## 2019-03-18 LAB — BASIC METABOLIC PANEL
Anion gap: 9 (ref 5–15)
BUN: 21 mg/dL (ref 8–23)
CO2: 18 mmol/L — ABNORMAL LOW (ref 22–32)
Calcium: 8.1 mg/dL — ABNORMAL LOW (ref 8.9–10.3)
Chloride: 112 mmol/L — ABNORMAL HIGH (ref 98–111)
Creatinine, Ser: 1.61 mg/dL — ABNORMAL HIGH (ref 0.44–1.00)
GFR calc Af Amer: 34 mL/min — ABNORMAL LOW (ref >60)
GFR calc non Af Amer: 29 mL/min — ABNORMAL LOW (ref >60)
Glucose, Bld: 108 mg/dL — ABNORMAL HIGH (ref 70–99)
Potassium: 4.2 mmol/L (ref 3.5–5.1)
Sodium: 139 mmol/L (ref 135–145)

## 2019-03-18 MED ORDER — DARBEPOETIN ALFA 300 MCG/0.6ML IJ SOSY
300.0000 ug | PREFILLED_SYRINGE | INTRAMUSCULAR | Status: DC
  Administered 2019-03-18: 300 ug via SUBCUTANEOUS
  Filled 2019-03-18: qty 0.6

## 2019-03-19 ENCOUNTER — Institutional Professional Consult (permissible substitution): Admit: 2019-03-19 | Discharge: 2019-03-20 | Payer: MEDICARE | Attending: Internal Medicine | Primary: Internal Medicine

## 2019-03-19 DIAGNOSIS — R0600 Dyspnea, unspecified: Secondary | ICD-10-CM | POA: Diagnosis not present

## 2019-03-19 DIAGNOSIS — I5030 Unspecified diastolic (congestive) heart failure: Secondary | ICD-10-CM | POA: Diagnosis not present

## 2019-03-19 DIAGNOSIS — I272 Pulmonary hypertension, unspecified: Secondary | ICD-10-CM | POA: Diagnosis not present

## 2019-03-19 DIAGNOSIS — J449 Chronic obstructive pulmonary disease, unspecified: Secondary | ICD-10-CM | POA: Diagnosis not present

## 2019-03-19 DIAGNOSIS — I4891 Unspecified atrial fibrillation: Secondary | ICD-10-CM | POA: Diagnosis not present

## 2019-03-20 DIAGNOSIS — Z7901 Long term (current) use of anticoagulants: Secondary | ICD-10-CM | POA: Diagnosis not present

## 2019-03-20 LAB — POCT INR: INR: 1.6 — AB (ref 0.9–1.1)

## 2019-03-21 ENCOUNTER — Telehealth: Payer: Self-pay

## 2019-03-21 NOTE — Telephone Encounter (Signed)
Patient has already been contacted about INR results and coumadin dose change.

## 2019-03-21 NOTE — Telephone Encounter (Signed)
Spoke with pt and informed her of her coumadin dose change. Pt gave a verbal understanding.

## 2019-03-21 NOTE — Telephone Encounter (Signed)
Copied from [place] [phone]. Topic: General - Other >> Mar 21, 2019  3:40 PM Leward Quan A wrote: Reason for CRM: [facility] called to say that patient INR on 03/20/2019 was 1.6 any questions please call Ph# [phone]

## 2019-03-21 NOTE — Telephone Encounter (Signed)
INR result form 03/20/2019 is 1.6. Pt is currently taking 2mg  coumadin daily.   Abstracted.

## 2019-03-21 NOTE — Telephone Encounter (Signed)
Have her take 4 mg today and tomorrow and resume 2 mg daily on Saturday.  Repeat INR next Thursday

## 2019-03-25 ENCOUNTER — Encounter: Payer: Self-pay | Admitting: Emergency Medicine

## 2019-03-25 ENCOUNTER — Emergency Department
Admission: EM | Admit: 2019-03-25 | Discharge: 2019-03-26 | Disposition: A | Discharge status: Home / Self Care | Payer: Medicare Other | Attending: Emergency Medicine | Admitting: Emergency Medicine

## 2019-03-25 ENCOUNTER — Ambulatory Visit: Payer: Self-pay | Admitting: Internal Medicine

## 2019-03-25 ENCOUNTER — Other Ambulatory Visit: Payer: Self-pay

## 2019-03-25 ENCOUNTER — Telehealth: Payer: Self-pay | Admitting: *Deleted

## 2019-03-25 ENCOUNTER — Ambulatory Visit: Payer: Medicare Other | Admitting: Family Medicine

## 2019-03-25 ENCOUNTER — Emergency Department: Payer: Medicare Other

## 2019-03-25 DIAGNOSIS — M545 Low back pain, unspecified: Secondary | ICD-10-CM

## 2019-03-25 DIAGNOSIS — N390 Urinary tract infection, site not specified: Secondary | ICD-10-CM | POA: Diagnosis not present

## 2019-03-25 DIAGNOSIS — R5381 Other malaise: Secondary | ICD-10-CM | POA: Diagnosis not present

## 2019-03-25 DIAGNOSIS — I110 Hypertensive heart disease with heart failure: Secondary | ICD-10-CM | POA: Insufficient documentation

## 2019-03-25 DIAGNOSIS — Z87891 Personal history of nicotine dependence: Secondary | ICD-10-CM | POA: Diagnosis not present

## 2019-03-25 DIAGNOSIS — I959 Hypotension, unspecified: Secondary | ICD-10-CM | POA: Diagnosis not present

## 2019-03-25 DIAGNOSIS — Z79899 Other long term (current) drug therapy: Secondary | ICD-10-CM | POA: Insufficient documentation

## 2019-03-25 DIAGNOSIS — R531 Weakness: Secondary | ICD-10-CM

## 2019-03-25 DIAGNOSIS — I509 Heart failure, unspecified: Secondary | ICD-10-CM | POA: Diagnosis not present

## 2019-03-25 DIAGNOSIS — R52 Pain, unspecified: Secondary | ICD-10-CM | POA: Diagnosis not present

## 2019-03-25 DIAGNOSIS — Z03818 Encounter for observation for suspected exposure to other biological agents ruled out: Secondary | ICD-10-CM | POA: Insufficient documentation

## 2019-03-25 DIAGNOSIS — D649 Anemia, unspecified: Secondary | ICD-10-CM | POA: Insufficient documentation

## 2019-03-25 DIAGNOSIS — R609 Edema, unspecified: Secondary | ICD-10-CM | POA: Diagnosis not present

## 2019-03-25 DIAGNOSIS — M48061 Spinal stenosis, lumbar region without neurogenic claudication: Secondary | ICD-10-CM | POA: Diagnosis not present

## 2019-03-25 DIAGNOSIS — G8929 Other chronic pain: Secondary | ICD-10-CM | POA: Insufficient documentation

## 2019-03-25 DIAGNOSIS — I4891 Unspecified atrial fibrillation: Secondary | ICD-10-CM | POA: Insufficient documentation

## 2019-03-25 DIAGNOSIS — R0902 Hypoxemia: Secondary | ICD-10-CM | POA: Diagnosis not present

## 2019-03-25 DIAGNOSIS — Z20828 Contact with and (suspected) exposure to other viral communicable diseases: Secondary | ICD-10-CM | POA: Diagnosis not present

## 2019-03-25 LAB — BASIC METABOLIC PANEL
Anion gap: 8 (ref 5–15)
BUN: 32 mg/dL — ABNORMAL HIGH (ref 8–23)
CO2: 20 mmol/L — ABNORMAL LOW (ref 22–32)
Calcium: 8 mg/dL — ABNORMAL LOW (ref 8.9–10.3)
Chloride: 113 mmol/L — ABNORMAL HIGH (ref 98–111)
Creatinine, Ser: 1.76 mg/dL — ABNORMAL HIGH (ref 0.44–1.00)
GFR calc Af Amer: 31 mL/min — ABNORMAL LOW (ref >60)
GFR calc non Af Amer: 26 mL/min — ABNORMAL LOW (ref >60)
Glucose, Bld: 125 mg/dL — ABNORMAL HIGH (ref 70–99)
Potassium: 4.9 mmol/L (ref 3.5–5.1)
Sodium: 141 mmol/L (ref 135–145)

## 2019-03-25 LAB — URINALYSIS, COMPLETE (UACMP) WITH MICROSCOPIC
Bacteria, UA: NONE SEEN
Bilirubin Urine: NEGATIVE
Glucose, UA: NEGATIVE mg/dL
Hgb urine dipstick: NEGATIVE
Ketones, ur: NEGATIVE mg/dL
Nitrite: NEGATIVE
Protein, ur: NEGATIVE mg/dL
Specific Gravity, Urine: 1.012 (ref 1.005–1.030)
Squamous Epithelial / HPF: NONE SEEN (ref 0–5)
pH: 5 (ref 5.0–8.0)

## 2019-03-25 LAB — CBC
HCT: 30.8 % — ABNORMAL LOW (ref 36.0–46.0)
HCT: 30.5 % — ABNORMAL LOW (ref 36.0–46.0)
Hemoglobin: 9.2 g/dL — ABNORMAL LOW (ref 12.0–15.0)
Hemoglobin: 9.1 g/dL — ABNORMAL LOW (ref 12.0–15.0)
MCH: 31.2 pg (ref 26.0–34.0)
MCH: 31.1 pg (ref 26.0–34.0)
MCHC: 29.9 g/dL — ABNORMAL LOW (ref 30.0–36.0)
MCHC: 29.8 g/dL — ABNORMAL LOW (ref 30.0–36.0)
MCV: 104.4 fL — ABNORMAL HIGH (ref 80.0–100.0)
MCV: 104.1 fL — ABNORMAL HIGH (ref 80.0–100.0)
Platelets: 432 10*3/uL — ABNORMAL HIGH (ref 150–400)
Platelets: 440 10*3/uL — ABNORMAL HIGH (ref 150–400)
RBC: 2.95 MIL/uL — ABNORMAL LOW (ref 3.87–5.11)
RBC: 2.93 MIL/uL — ABNORMAL LOW (ref 3.87–5.11)
RDW: 17.7 % — ABNORMAL HIGH (ref 11.5–15.5)
RDW: 17.6 % — ABNORMAL HIGH (ref 11.5–15.5)
WBC: 5.2 10*3/uL (ref 4.0–10.5)
WBC: 5 10*3/uL (ref 4.0–10.5)
nRBC: 4 % — ABNORMAL HIGH (ref 0.0–0.2)
nRBC: 4 % — ABNORMAL HIGH (ref 0.0–0.2)

## 2019-03-25 LAB — DIFFERENTIAL
Abs Immature Granulocytes: 0.03 10*3/uL (ref 0.00–0.07)
Basophils Absolute: 0.1 10*3/uL (ref 0.0–0.1)
Basophils Relative: 1 %
Eosinophils Absolute: 0 10*3/uL (ref 0.0–0.5)
Eosinophils Relative: 1 %
Immature Granulocytes: 1 %
Lymphocytes Relative: 6 %
Lymphs Abs: 0.3 10*3/uL — ABNORMAL LOW (ref 0.7–4.0)
Monocytes Absolute: 0.4 10*3/uL (ref 0.1–1.0)
Monocytes Relative: 8 %
Neutro Abs: 4.2 10*3/uL (ref 1.7–7.7)
Neutrophils Relative %: 83 %

## 2019-03-25 LAB — SARS CORONAVIRUS 2 BY RT PCR (HOSPITAL ORDER, PERFORMED IN ~~LOC~~ HOSPITAL LAB): SARS Coronavirus 2: NEGATIVE

## 2019-03-25 MED ORDER — OXYCODONE-ACETAMINOPHEN 5-325 MG PO TABS
2.0000 | ORAL_TABLET | Freq: Once | ORAL | Status: AC
  Administered 2019-03-25: 18:00:00 2 via ORAL
  Filled 2019-03-25: qty 2

## 2019-03-25 MED ORDER — CIPROFLOXACIN IN D5W 400 MG/200ML IV SOLN
400.0000 mg | Freq: Once | INTRAVENOUS | Status: AC
  Administered 2019-03-25: 23:00:00 400 mg via INTRAVENOUS
  Filled 2019-03-25: qty 200

## 2019-03-25 NOTE — Telephone Encounter (Signed)
Returned call to patient and advised PCP ants her to go to ER for evaluation, due to patient says she cannot lift legs to elevate due to edema , complaint of back pain and cannot take care of herself, patient ask nurse to call  911 to pick her up , called and notified EMS and advised patient may not be able to get to door. Patient complained of severe weakness and fatigue advised EMS of symptoms to caution in case of COVID exposure.

## 2019-03-25 NOTE — ED Notes (Signed)
Pain meds given

## 2019-03-25 NOTE — ED Notes (Signed)
Iv infiltrated   meds stopped.  Iv consult ordered

## 2019-03-25 NOTE — Telephone Encounter (Signed)
Patient has Bilateral edema in both legs described as +3, denies SOB , sounds winded on phone , patient says also she is having back spasm from twisting back is asking for SNF placement or homehealth aide, Patient says she has no way into office , scheduled with NP for the Bilateral Edema today at 3:40 virtual visit.

## 2019-03-25 NOTE — Telephone Encounter (Signed)
Copied from [place] [id number]. Topic: General - Other >> Mar 25, 2019 12:04 PM Leward Quan A wrote: Reason for CRM: Patient called to inform Dr Derrel Nip that she twisted her back about a week ago and is now having a difficult time with dressing her self. States that she need to be set up for home health aide in home care. Ph# [phone]

## 2019-03-25 NOTE — ED Triage Notes (Signed)
Patient presents to the ED with bilateral leg swelling and discoloration with right lower back and right hip pain.  Patient states she is having new difficulty standing and walking.  Patient states, "I was discharged from the hospital on Wednesday and I haven't been right since then."  Legs are significantly swollen with purplish/red discoloration.  Patient states leg swelling began prior to discharge from hospital.  Patient states she is taking pill called "femera" for breast cancer.

## 2019-03-25 NOTE — ED Notes (Signed)
Pt waiting on social consult.  Pt alert.  Ambulate with assistance to toilet.

## 2019-03-25 NOTE — Telephone Encounter (Signed)
Cannot go from office to skilled nursing,  She'll have to be admitted

## 2019-03-25 NOTE — ED Provider Notes (Signed)
[facility] Emergency Department Provider Note       Time seen: ----------------------------------------- 5:46 PM on 03/25/2019 -----------------------------------------   I have reviewed the triage vital signs and the nursing notes.  HISTORY   Chief Complaint Leg Swelling and Back Pain    HPI Diana [name] is a 84 y.o. female with a history of anemia, atrial fibrillation, CHF, DVT, scoliosis, shingles, sciatica who presents to the ED for general debility.  Patient reports persistent leg swelling with worsening low back pain.  She has new difficulty standing and walking.  Occasionally she has pain that radiates from her back into her right leg.  She was discharged from the hospital on Wednesday and she states she has not been able to take care of herself since then.  She is using a walker.  She denies fevers, chills or other complaints.  Past Medical History:  Diagnosis Date  . Acute glomerulonephritis with other specified pathological lesion in kidney in disease classified elsewhere(580.81)   . Alveolar aeration decreased   . Anemia   . Arthritis   . Atrial fibrillation ([place])   . Breast cancer ([place]) 06/03/2014   positive, radiation  . Cancer (HCC)    Breast  . CHF (congestive heart failure) ([place])   . Diaphragmatic hernia   . DVT (deep venous thrombosis) ([place])   . Dysrhythmia   . Environmental allergies   . Erosive esophagitis 03/28/2015   esophageal biopsy July 2017 c/w CMV cytopathic changes, negative for H Pylori and dysplasia     . Esophageal reflux   . Esophagitis, CMV ([place]) 05/26/2015   Diagnosed at [facility] with manometry,  Tube feeds started for enteral nutrtion 05/12/15   . Feeding problem    FEEDING TUBE  . Hiatal hernia   . Hyperlipidemia   . Hypertension   . Lower extremity edema   . Lupus (systemic lupus erythematosus) ([place])   . Neuropathy   . Osteopenia   . Oxygen deficiency    USES HS  . Peripheral neuropathy, hereditary/idiopathic    . Personal history of radiation therapy   . Pneumonia   . Primary pulmonary hypertension ([place])   . Pulmonary hypertension ([place])   . Renal insufficiency   . Sciatica   . Shingles   . Shingles (herpes zoster) polyneuropathy March 2013   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  . Shortness of breath dyspnea   . Unspecified hypothyroidism   . Unspecified menopausal and postmenopausal disorder   . Urinary incontinence without sensory awareness   . Vitamin B deficiency     Patient Active Problem List   Diagnosis Date Noted  . Nausea and vomiting   . Dysphasia   . Food impaction of esophagus   . Stricture and stenosis of esophagus   . Diverticulitis 03/10/2019  . Abdominal pain, chronic, left lower quadrant 12/13/2018  . Osteoporosis 08/11/2018  . Nocturnal hypoxemia due to pulmonary hypertension ([place]) 07/29/2018  . Tubular adenoma of colon 06/08/2018  . Chronic right hip pain 05/09/2018  . Hospital discharge follow-up 12/16/2017  . Ventral hernia without obstruction or gangrene 11/09/2017  . Anticoagulation goal of INR 2 to 3 09/14/2016  . Acquired asplenia 08/21/2016  . Sjogrens syndrome ([place]) 08/21/2016  . Long term systemic steroid user 08/17/2016  . Facet arthritis of lumbosacral region 12/16/2015  . History of Helicobacter pylori infection 08/17/2015  . H/O adenomatous polyp of colon 08/17/2015  . Hypertension 08/17/2015  . Hyperlipidemia, unspecified 08/17/2015  . Absolute anemia 07/17/2015  . History  of compression fracture of vertebral column 07/03/2015  . Generalized weakness 07/03/2015  . Anemia, iron deficiency 07/03/2015  . History of fall within past 90 days 04/07/2015  . Protein-calorie malnutrition ([place]) 04/06/2015  . History of gastric surgery 03/24/2015  . Chronic nausea 03/24/2015  . History of shingles 11/10/2014  . Venous (peripheral) insufficiency 06/13/2014  . Breast cancer, left ([place]) 05/26/2014  . Malignant neoplasm of upper-outer quadrant of female  breast ([place]) 05/26/2014  . Urinary incontinence 01/15/2014  . Other specified disorders of bladder 01/02/2014  . Familial multiple lipoprotein-type hyperlipidemia 08/21/2013  . History of recurrent pneumonia 05/26/2013  . Pulmonary hypertensive venous disease ([place]) 04/23/2013  . Diastolic heart failure ([place]) 04/23/2013  . Long term current use of anticoagulant therapy 03/27/2013  . Incomplete bladder emptying 03/06/2013  . Chronic venous embolism and thrombosis of deep vessels of lower extremity ([place]) 01/07/2013  . Hernia, hiatal 01/06/2013  . Diaphragmatic hernia 01/06/2013  . Left leg DVT ([place]) 12/18/2012  . Sciatica 11/20/2012  . Microscopic hematuria 11/14/2012  . Insomnia 11/08/2011  . Lupus erythematosus 06/13/2008  . NEUROPATHY-PERIPHERAL 10/06/2006  . Hereditary and idiopathic peripheral neuropathy 10/06/2006  . Hypothyroidism 09/25/2006  . ANEMIA, B12 DEFICIENCY 09/25/2006  . GERD 09/25/2006  . Lung involvement in systemic lupus erythematosus ([place]) 09/25/2006  . Type 2 diabetes mellitus without complications ([place]) 90/30/0923  . SHINGLES, HX OF 09/25/2006  . TOBACCO ABUSE, HX OF 09/25/2006  . Personal history of endocrine, metabolic or immunity disorder 09/25/2006    Past Surgical History:  Procedure Laterality Date  . BREAST BIOPSY Left 2015   invasive mammary carcinoma  . BREAST LUMPECTOMY Left 2015   invasive mammary carcinoma clear margins but close  . BREAST SURGERY    . CATARACT EXTRACTION W/PHACO Right 04/05/2016   Procedure: CATARACT EXTRACTION PHACO AND INTRAOCULAR LENS PLACEMENT (IOC);  Surgeon: Birder Robson, MD;  Location: [facility] ORS;  Service: Ophthalmology;  Laterality: Right;  [place] 01:20 AP% 23.9 CDE 19.29 fluid pack lot # [id number]  . CATARACT EXTRACTION W/PHACO Left 04/26/2016   Procedure: CATARACT EXTRACTION PHACO AND INTRAOCULAR LENS PLACEMENT ([place]);  Surgeon: Birder Robson, MD;  Location: [facility] ORS;  Service: Ophthalmology;  Laterality: Left;   [place]  00:59 AP% 23.2 CDE 13.83 Fluid pack lot # [id number]  . COLONOSCOPY WITH PROPOFOL N/A 06/04/2018   Procedure: COLONOSCOPY WITH PROPOFOL;  Surgeon: Lollie Sails, MD;  Location: [facility] ENDOSCOPY;  Service: Endoscopy;  Laterality: N/A;  . DIAGNOSTIC LAPAROSCOPY    . ESOPHAGOGASTRODUODENOSCOPY N/A 07/19/2015   Procedure: ESOPHAGOGASTRODUODENOSCOPY (EGD);  Surgeon: Hulen Luster, MD;  Location[facility] ENDOSCOPY;  Service: Gastroenterology;  Laterality: N/A;  . ESOPHAGOGASTRODUODENOSCOPY (EGD) WITH PROPOFOL N/A 03/28/2015   Procedure: ESOPHAGOGASTRODUODENOSCOPY (EGD) WITH PROPOFOL;  Surgeon: Robert Bellow, MD;  Location: [facility] ENDOSCOPY;  Service: Endoscopy;  Laterality: N/A;  . ESOPHAGOGASTRODUODENOSCOPY (EGD) WITH PROPOFOL N/A 06/10/2015   Procedure: ESOPHAGOGASTRODUODENOSCOPY (EGD) WITH PROPOFOL;  Surgeon: Lollie Sails, MD;  Location: [facility] ENDOSCOPY;  Service: Endoscopy;  Laterality: N/A;  . ESOPHAGOGASTRODUODENOSCOPY (EGD) WITH PROPOFOL N/A 10/20/2015   Procedure: ESOPHAGOGASTRODUODENOSCOPY (EGD) WITH PROPOFOL;  Surgeon: Lollie Sails, MD;  Location: [facility] ENDOSCOPY;  Service: Endoscopy;  Laterality: N/A;  . ESOPHAGOGASTRODUODENOSCOPY (EGD) WITH PROPOFOL N/A 02/23/2016   Procedure: ESOPHAGOGASTRODUODENOSCOPY (EGD) WITH PROPOFOL;  Surgeon: Lollie Sails, MD;  Location: [facility] ENDOSCOPY;  Service: Endoscopy;  Laterality: N/A;  . ESOPHAGOGASTRODUODENOSCOPY (EGD) WITH PROPOFOL N/A 06/30/2016   Procedure: ESOPHAGOGASTRODUODENOSCOPY (EGD) WITH PROPOFOL;  Surgeon: Lollie Sails, MD;  Location: [facility] ENDOSCOPY;  Service: Endoscopy;  Laterality: N/A;  . ESOPHAGOGASTRODUODENOSCOPY (EGD) WITH PROPOFOL N/A 01/05/2017   Procedure: ESOPHAGOGASTRODUODENOSCOPY (EGD) WITH PROPOFOL;  Surgeon: Lollie Sails, MD;  Location: [facility] ENDOSCOPY;  Service: Endoscopy;  Laterality: N/A;  . ESOPHAGOGASTRODUODENOSCOPY (EGD) WITH PROPOFOL N/A 03/11/2019   Procedure: ESOPHAGOGASTRODUODENOSCOPY (EGD) WITH PROPOFOL;   Surgeon: [name], MD;  Location: [facility] ENDOSCOPY;  Service: Endoscopy;  Laterality: N/A;  . KYPHOPLASTY N/A 02/10/2015   Procedure: [id number];  Surgeon: Hessie Knows, MD;  Location: [facility] ORS;  Service: Orthopedics;  Laterality: N/A;  . LAPAROTOMY N/A 03/11/2015   Procedure: REDUCTION OF GASTRIC VOLVULUS, SPLEENECTOMY, GASTRIC TUBE PLACEMENT;  Surgeon: Robert Bellow, MD;  Location: [facility] ORS;  Service: General;  Laterality: N/A;  . MOHS SURGERY    . PERIPHERAL VASCULAR CATHETERIZATION N/A 06/12/2015   Procedure: IVC Filter Insertion;  Surgeon: Algernon Huxley, MD;  Location: [place] CV LAB;  Service: Cardiovascular;  Laterality: N/A;  . PERIPHERAL VASCULAR CATHETERIZATION N/A 08/23/2016   Procedure: IVC Filter Removal;  Surgeon: Katha Cabal, MD;  Location: [place] CV LAB;  Service: Cardiovascular;  Laterality: N/A;  . STOMACH SURGERY     for volvolus    Allergies Iodinated diagnostic agents, Tramadol, Amoxicillin, Cefuroxime, Metrizamide, Morphine and related, Propoxyphene, Cephalexin, Sulfa antibiotics, Sulfonamide derivatives, Tramadol hcl, and Venlafaxine  Social History Social History   Tobacco Use  . Smoking status: Former Smoker    Quit date: 05/25/1991    Years since quitting: 27.8  . Smokeless tobacco: Never Used  Substance Use Topics  . Alcohol use: No    Frequency: Never    Comment: rare  . Drug use: No   Review of Systems Constitutional: Negative for fever. Cardiovascular: Negative for chest pain. Respiratory: Negative for shortness of breath. Gastrointestinal: Negative for abdominal pain, vomiting and diarrhea. Musculoskeletal: Positive for back pain and leg swelling Skin: Negative for rash. Neurological: Negative for headaches, focal weakness or numbness.  All systems negative/normal/unremarkable except as stated in the HPI  ____________________________________________   PHYSICAL EXAM:  VITAL SIGNS: ED Triage Vitals  Enc Vitals Group      BP 03/25/19 1512 91/60     Pulse Rate 03/25/19 1512 82     Resp 03/25/19 1512 20     Temp 03/25/19 1512 99 F (37.2 C)     Temp Source 03/25/19 1512 Oral     SpO2 03/25/19 1512 90 %     Weight 03/25/19 1514 104 lb (47.2 kg)     Height 03/25/19 1514 5' (1.524 m)     Head Circumference --      Peak Flow --      Pain Score 03/25/19 1513 6     Pain Loc --      Pain Edu? --      Excl. in [place]? --     Constitutional: Alert and oriented. Well appearing and in no distress. Eyes: Conjunctivae are normal. Normal extraocular movements. ENT      Head: Normocephalic and atraumatic.      Nose: No congestion/rhinnorhea.      Mouth/Throat: Mucous membranes are moist.      Neck: No stridor. Cardiovascular: Normal rate, regular rhythm. No murmurs, rubs, or gallops. Respiratory: Normal respiratory effort without tachypnea nor retractions. Breath sounds are clear and equal bilaterally. No wheezes/rales/rhonchi. Gastrointestinal: Soft and nontender. Normal bowel sounds Musculoskeletal: Nontender with normal range of motion in extremities. No lower extremity tenderness nor edema. Neurologic:  Normal speech and language. No gross focal neurologic deficits are  appreciated.  Skin:  Skin is warm, dry and intact. No rash noted. Psychiatric: Mood and affect are normal. Speech and behavior are normal.  ____________________________________________  EKG: Interpreted by me.  Sinus rhythm with rate of 76 bpm, normal PR interval, normal QRS, normal QT  ____________________________________________  ED COURSE:  As part of my medical decision making, I reviewed the following data within the [facility] History obtained from family if available, nursing notes, old chart and ekg, as well as notes from prior ED visits. Patient presented for leg swelling, weakness and low back pain, we will assess with labs and imaging as indicated at this time.   Procedures  Diana [name] was evaluated in  Emergency Department on 03/25/2019 for the symptoms described in the history of present illness. She was evaluated in the context of the global COVID-19 pandemic, which necessitated consideration that the patient might be at risk for infection with the SARS-CoV-2 virus that causes COVID-19. Institutional protocols and algorithms that pertain to the evaluation of patients at risk for COVID-19 are in a state of rapid change based on information released by regulatory bodies including the CDC and federal and state organizations. These policies and algorithms were followed during the patient's care in the ED.  ____________________________________________   LABS (pertinent positives/negatives)  Labs Reviewed  BASIC METABOLIC PANEL - Abnormal; Notable for the following components:      Result Value   Chloride 113 (*)    CO2 20 (*)    Glucose, Bld 125 (*)    BUN 32 (*)    Creatinine, Ser 1.76 (*)    Calcium 8.0 (*)    GFR calc non Af Amer 26 (*)    GFR calc Af Amer 31 (*)    All other components within normal limits  CBC - Abnormal; Notable for the following components:   RBC 2.95 (*)    Hemoglobin 9.2 (*)    HCT 30.8 (*)    MCV 104.4 (*)    MCHC 29.9 (*)    RDW 17.7 (*)    Platelets 432 (*)    nRBC 4.0 (*)    All other components within normal limits  DIFFERENTIAL - Abnormal; Notable for the following components:   Lymphs Abs 0.3 (*)    All other components within normal limits  CBC - Abnormal; Notable for the following components:   RBC 2.93 (*)    Hemoglobin 9.1 (*)    HCT 30.5 (*)    MCV 104.1 (*)    MCHC 29.8 (*)    RDW 17.6 (*)    Platelets 440 (*)    nRBC 4.0 (*)    All other components within normal limits  SARS CORONAVIRUS 2 (HOSPITAL ORDER, [facility])  URINALYSIS, COMPLETE (UACMP) WITH MICROSCOPIC  PATHOLOGIST SMEAR REVIEW    RADIOLOGY Images were viewed by me  lumbar spine x-ray IMPRESSION: No acute abnormality  noted. ____________________________________________   DIFFERENTIAL DIAGNOSIS   General debility, dehydration, electrolyte abnormality, venous stasis, DVT, occult infection, compression fracture, sciatica  FINAL ASSESSMENT AND PLAN  Weakness, low back pain, peripheral edema   Plan: The patient had presented for difficulty with activities of daily living. Patient's labs reveal stable chronic abnormalities. Patient's imaging did not reveal any acute process.  I placed a social work and physical therapy consult.  It is unclear if she can go home and care for herself with assistance or whether she will need skilled nursing facility placement.  Laurence Aly, MD    Note: This note was generated in part or whole with voice recognition software. Voice recognition is usually quite accurate but there are transcription errors that can and very often do occur. I apologize for any typographical errors that were not detected and corrected.     Earleen Newport, MD 03/25/19 [id number]

## 2019-03-25 NOTE — ED Notes (Signed)
ED Provider at bedside. 

## 2019-03-25 NOTE — ED Notes (Signed)
md talked with son via phone and pt talked with son via phone

## 2019-03-25 NOTE — Telephone Encounter (Signed)
Pt. Reports she has already talked to the office and she is going to the ED.

## 2019-03-25 NOTE — ED Notes (Signed)
Pt reports bil leg swelling and back pain.  Sx for 5 days.  Pt recently discharged from [facility] for similar sx.  Pt alert speech clear.

## 2019-03-25 NOTE — Telephone Encounter (Signed)
Called patient back to discuss her situation and she says she just cannot go to ER , refused to let me call EMS, she feels like does not have the strength to go to ER by EMS, she says " I am weak and swollen, my back hurts and if I can get home health out here I think that will help." Advised patient she becomes SOB she will have to call 911.

## 2019-03-25 NOTE — ED Triage Notes (Signed)
Pt in via EMS from home with c/o swelling to LE. EMS reports on their arrival pt was c.o pain to back, ain to stand, pain to walk. EMS reports per family, pt not able to move at home very well sine d/c from the hospital.   85/54, 93% RA,   Currently being treated for Breast cancer and uses a fentanyl patch.   EMS reports MD wanted her to come to the ED due to edema.

## 2019-03-25 NOTE — Telephone Encounter (Signed)
I can talk with her over phone for visit, but if she is winded and that swollen I may need to recommend she go to ER  I have never been able to get someone admitted to SNF from the office before; usually it has always happened after going to ER/hospital admit -- not sure how to go about that?

## 2019-03-26 DIAGNOSIS — R531 Weakness: Secondary | ICD-10-CM | POA: Diagnosis not present

## 2019-03-26 LAB — PROTIME-INR
INR: 2.5 — ABNORMAL HIGH (ref 0.8–1.2)
Prothrombin Time: 26.7 seconds — ABNORMAL HIGH (ref 11.4–15.2)

## 2019-03-26 LAB — PATHOLOGIST SMEAR REVIEW

## 2019-03-26 MED ORDER — METOCLOPRAMIDE HCL 10 MG PO TABS
10.0000 mg | ORAL_TABLET | Freq: Three times a day (TID) | ORAL | Status: DC
  Administered 2019-03-26 (×2): 10 mg via ORAL
  Filled 2019-03-26 (×2): qty 1

## 2019-03-26 MED ORDER — VITAMIN C 500 MG PO TABS
1000.0000 mg | ORAL_TABLET | Freq: Every day | ORAL | Status: DC
  Administered 2019-03-26: 1000 mg via ORAL
  Filled 2019-03-26: qty 2

## 2019-03-26 MED ORDER — PREDNISONE 10 MG PO TABS
5.0000 mg | ORAL_TABLET | Freq: Every day | ORAL | Status: DC
  Administered 2019-03-26: 5 mg via ORAL
  Filled 2019-03-26: qty 1

## 2019-03-26 MED ORDER — SUCRALFATE 1 GM/10ML PO SUSP
1.0000 g | Freq: Three times a day (TID) | ORAL | Status: DC
  Administered 2019-03-26 (×2): 1 g via ORAL
  Filled 2019-03-26 (×4): qty 10

## 2019-03-26 MED ORDER — METOPROLOL SUCCINATE ER 25 MG PO TB24
12.5000 mg | ORAL_TABLET | Freq: Every day | ORAL | Status: DC
  Administered 2019-03-26: 09:00:00 12.5 mg via ORAL
  Filled 2019-03-26: qty 0.5

## 2019-03-26 MED ORDER — LOSARTAN POTASSIUM 50 MG PO TABS: 25.0000 mg | ORAL_TABLET | Freq: Every day | ORAL | Status: DC

## 2019-03-26 MED ORDER — ONDANSETRON 4 MG PO TBDP: 4.0000 mg | ORAL_TABLET | Freq: Three times a day (TID) | ORAL | Status: DC | PRN

## 2019-03-26 MED ORDER — HYDROXYCHLOROQUINE SULFATE 200 MG PO TABS
200.0000 mg | ORAL_TABLET | Freq: Every day | ORAL | Status: DC
  Administered 2019-03-26: 09:00:00 200 mg via ORAL
  Filled 2019-03-26: qty 1

## 2019-03-26 MED ORDER — LEVOTHYROXINE SODIUM 112 MCG PO TABS
112.0000 ug | ORAL_TABLET | Freq: Every day | ORAL | Status: DC
  Administered 2019-03-26: 112 ug via ORAL
  Filled 2019-03-26: qty 1

## 2019-03-26 MED ORDER — WARFARIN - PHYSICIAN DOSING INPATIENT
Freq: Every day | Status: DC
  Filled 2019-03-26: qty 1

## 2019-03-26 MED ORDER — OXYCODONE-ACETAMINOPHEN 5-325 MG PO TABS
1.0000 | ORAL_TABLET | Freq: Two times a day (BID) | ORAL | Status: DC | PRN
  Administered 2019-03-26: 1 via ORAL
  Filled 2019-03-26: qty 1

## 2019-03-26 MED ORDER — FOSFOMYCIN TROMETHAMINE 3 G PO PACK
3.0000 g | PACK | Freq: Once | ORAL | Status: AC
  Administered 2019-03-26: 12:00:00 3 g via ORAL
  Filled 2019-03-26: qty 3

## 2019-03-26 MED ORDER — MUPIROCIN 2 % EX OINT
1.0000 "application " | TOPICAL_OINTMENT | Freq: Two times a day (BID) | CUTANEOUS | Status: DC
  Administered 2019-03-26: 1 via TOPICAL
  Filled 2019-03-26: qty 22

## 2019-03-26 MED ORDER — PANTOPRAZOLE SODIUM 40 MG PO TBEC
40.0000 mg | DELAYED_RELEASE_TABLET | Freq: Two times a day (BID) | ORAL | Status: DC
  Administered 2019-03-26: 09:00:00 40 mg via ORAL
  Filled 2019-03-26: qty 1

## 2019-03-26 MED ORDER — MYCOPHENOLATE MOFETIL 250 MG PO CAPS
1000.0000 mg | ORAL_CAPSULE | Freq: Two times a day (BID) | ORAL | Status: DC
  Administered 2019-03-26: 1000 mg via ORAL
  Filled 2019-03-26 (×2): qty 4

## 2019-03-26 MED ORDER — WARFARIN SODIUM 2 MG PO TABS
2.0000 mg | ORAL_TABLET | Freq: Every day | ORAL | Status: DC
  Administered 2019-03-26: 11:00:00 2 mg via ORAL
  Filled 2019-03-26 (×2): qty 1

## 2019-03-26 MED ORDER — ALPRAZOLAM 0.5 MG PO TABS: 0.2500 mg | ORAL_TABLET | Freq: Two times a day (BID) | ORAL | Status: DC | PRN

## 2019-03-26 MED ORDER — LETROZOLE 2.5 MG PO TABS
2.5000 mg | ORAL_TABLET | Freq: Every day | ORAL | Status: DC
  Administered 2019-03-26: 09:00:00 2.5 mg via ORAL
  Filled 2019-03-26: qty 1

## 2019-03-26 MED ORDER — CALCIUM CARBONATE-VITAMIN D 500-200 MG-UNIT PO TABS
1.0000 | ORAL_TABLET | Freq: Every day | ORAL | Status: DC
  Administered 2019-03-26: 1 via ORAL
  Filled 2019-03-26: qty 1

## 2019-03-26 NOTE — ED Notes (Signed)
Discharge and follow up instructions discussed with pt and pt son. Both verbalized understanding of information provided.

## 2019-03-26 NOTE — TOC Initial Note (Signed)
Transition of Care [facility]) - Initial/Assessment Note    Patient Details  Name: Diana [name] MRN: [id number] Date of Birth: 01/16/1935  Transition of Care [facility]) CM/SW Contact:    Cala Bradford, LCSW Phone Number: 03/26/2019, 10:10 AM  Clinical Narrative:                  Patient is an 84 year old female that presents to the ED for leg swelling and back pain. Patient states that she currently lives with her son, Reuel Boom, and uses a rolling walker at home. Patient reports that she walks well at home.  PT assessed patient and recommends home health PT. Patient shares that she would also like a nurse aide to help her bathe. Patient was provided with home health compare list and chose [facility]. CSW attempting to reach Melissa with [facility] to set up services.  Patient's son Reuel Boom was informed about discharge planning, and was told that [facility] should be coming out to the home within 24-72 hours.     Expected Discharge Plan: Home w Home Health Services Barriers to Discharge: Continued Medical Work up   Patient Goals and CMS Choice   CMS Medicare.gov Compare Post Acute Care list provided to:: Patient Choice offered to / list presented to : Patient  Expected Discharge Plan and Services Expected Discharge Plan: Home w Home Health Services   Discharge Planning Services: CM Consult Post Acute Care Choice: Home Health Living arrangements for the past 2 months: Single Family Home                           HH Arranged: PT, Nurse's Aide HH Agency: [facility] Date HH Agency Contacted: 03/26/19 Time HH Agency Contacted: 1000 Representative spoke with at [facility] Agency: Melissa  Prior Living Arrangements/Services Living arrangements for the past 2 months: Single Family Home Lives with:: Adult Children(patient lives with son, Reuel Boom) Patient language and need for interpreter reviewed:: Yes Do you feel safe going back to the place where you live?:  Yes        Care giver support system in place?: No (comment) Current home services: DME(patient has a walker) Criminal Activity/Legal Involvement Pertinent to Current Situation/Hospitalization: No - Comment as needed  Activities of Daily Living      Permission Sought/Granted Permission sought to share information with : Family Supports Permission granted to share information with : Yes, Verbal Permission Granted  Share Information with NAME: Reuel Boom     Permission granted to share info w Relationship: Son  Permission granted to share info w Contact Information: [phone]  Emotional Assessment Appearance:: Appears stated age Attitude/Demeanor/Rapport: Engaged Affect (typically observed): Calm, Accepting, Appropriate Orientation: : Oriented to Self, Oriented to Place, Oriented to  Time, Oriented to Situation   Psych Involvement: No (comment)  Admission diagnosis:  Back Pain  Patient Active Problem List   Diagnosis Date Noted  . Nausea and vomiting   . Dysphasia   . Food impaction of esophagus   . Stricture and stenosis of esophagus   . Diverticulitis 03/10/2019  . Abdominal pain, chronic, left lower quadrant 12/13/2018  . Osteoporosis 08/11/2018  . Nocturnal hypoxemia due to pulmonary hypertension (HCC) 07/29/2018  . Tubular adenoma of colon 06/08/2018  . Chronic right hip pain 05/09/2018  . Hospital discharge follow-up 12/16/2017  . Ventral hernia without obstruction or gangrene 11/09/2017  . Anticoagulation goal of INR 2 to 3 09/14/2016  . Acquired asplenia  08/21/2016  . Sjogrens syndrome (HCC) 08/21/2016  . Long term systemic steroid user 08/17/2016  . Facet arthritis of lumbosacral region 12/16/2015  . History of Helicobacter pylori infection 08/17/2015  . H/O adenomatous polyp of colon 08/17/2015  . Hypertension 08/17/2015  . Hyperlipidemia, unspecified 08/17/2015  . Absolute anemia 07/17/2015  . History of compression fracture of vertebral column 07/03/2015  .  Generalized weakness 07/03/2015  . Anemia, iron deficiency 07/03/2015  . History of fall within past 90 days 04/07/2015  . Protein-calorie malnutrition (HCC) 04/06/2015  . History of gastric surgery 03/24/2015  . Chronic nausea 03/24/2015  . History of shingles 11/10/2014  . Venous (peripheral) insufficiency 06/13/2014  . Breast cancer, left (HCC) 05/26/2014  . Malignant neoplasm of upper-outer quadrant of female breast (HCC) 05/26/2014  . Urinary incontinence 01/15/2014  . Other specified disorders of bladder 01/02/2014  . Familial multiple lipoprotein-type hyperlipidemia 08/21/2013  . History of recurrent pneumonia 05/26/2013  . Pulmonary hypertensive venous disease (HCC) 04/23/2013  . Diastolic heart failure (HCC) 04/23/2013  . Long term current use of anticoagulant therapy 03/27/2013  . Incomplete bladder emptying 03/06/2013  . Chronic venous embolism and thrombosis of deep vessels of lower extremity (HCC) 01/07/2013  . Hernia, hiatal 01/06/2013  . Diaphragmatic hernia 01/06/2013  . Left leg DVT (HCC) 12/18/2012  . Sciatica 11/20/2012  . Microscopic hematuria 11/14/2012  . Insomnia 11/08/2011  . Lupus erythematosus 06/13/2008  . NEUROPATHY-PERIPHERAL 10/06/2006  . Hereditary and idiopathic peripheral neuropathy 10/06/2006  . Hypothyroidism 09/25/2006  . ANEMIA, B12 DEFICIENCY 09/25/2006  . GERD 09/25/2006  . Lung involvement in systemic lupus erythematosus (HCC) 09/25/2006  . Type 2 diabetes mellitus without complications (HCC) 09/25/2006  . SHINGLES, HX OF 09/25/2006  . TOBACCO ABUSE, HX OF 09/25/2006  . Personal history of endocrine, metabolic or immunity disorder 40/98/1191   PCP:  Sherlene Shams, MD Pharmacy:   [facility] [phone] [place],  - [address] [place] [address] Phone: [phone] Fax: [phone]     Social Determinants of Health (SDOH) Interventions    Readmission Risk Interventions No flowsheet data found.

## 2019-03-26 NOTE — ED Notes (Signed)
Pt placed on a hospital bed

## 2019-03-26 NOTE — Discharge Instructions (Signed)
We gave you a dose of antibiotics here to cover you for UTI.  Do not need to take any at home.  Also set up with home health.  Return to the ER for any other concerns.

## 2019-03-26 NOTE — ED Notes (Signed)
Pharmacy contacted regarding missing dose of warfarin. Pharmacy states they will send medication to ED

## 2019-03-26 NOTE — ED Notes (Signed)
Patient sleeping soundly at this time.

## 2019-03-26 NOTE — ED Notes (Signed)
Pt changed into personal clothing. Pt son contacted and states he will pick pt up in around 30 mins

## 2019-03-26 NOTE — ED Provider Notes (Signed)
Home health arranged for patient, abx for uti, pcp f/u   Diana Drafts, MD 03/26/19 1135

## 2019-03-26 NOTE — ED Notes (Signed)
Pt alert.  Iv restarted by iv team.  meds infusing.  nsr on monitor.  Skin warm and dry.

## 2019-03-26 NOTE — Evaluation (Signed)
Physical Therapy Evaluation Patient Details Name: Diana [name] MRN: [id number] DOB: 1935-05-04 Today's Date: 03/26/2019   History of Present Illness  Patient is an 84 year old female admitted from home with reported weakness LE swelling, and back pain. Patient recently discharged last week and could not manage on her own. PMH to inlcude: CHF, DVT, scoliosis.  Clinical Impression  Patient received in bed with RN present giving medications. Patient agreeable to PT. Patient is able to perform bed mobility with supervision and use of rails. Patient transfers with min guard assist and is able to ambulate with RW, min guard assist 50 feet. PT limited ambulation distance due to increased HR with activity. Patient required min assist to raise B LEs onto bed. She will benefit from continued skilled PT to improve strength, and independence with mobility.          Follow Up Recommendations Home health PT    Equipment Recommendations  None recommended by PT    Recommendations for Other Services       Precautions / Restrictions Precautions Precautions: Fall Restrictions Weight Bearing Restrictions: No      Mobility  Bed Mobility Overal bed mobility: Needs Assistance Bed Mobility: Sit to Supine;Supine to Sit     Supine to sit: Modified independent (Device/Increase time) Sit to supine: Min guard   General bed mobility comments: minimal assist to raise LEs back up onto bed.  Transfers Overall transfer level: Modified independent Equipment used: Rolling walker (2 wheeled)                Ambulation/Gait Ambulation/Gait assistance: Supervision Gait Distance (Feet): 50 Feet Assistive device: Rolling walker (2 wheeled) Gait Pattern/deviations: Step-through pattern Gait velocity: decreased   General Gait Details: Pt was able to ambulate with slow but safe cadence that is near her baseline.  Pt with severe scoliosis that leads to stooped posture but no overt safety issues.    Stairs            Wheelchair Mobility    Modified Rankin (Stroke Patients Only)       Balance Overall balance assessment: Modified Independent                                           Pertinent Vitals/Pain      Home Living Family/patient expects to be discharged to:: Private residence Living Arrangements: Children Available Help at Discharge: Family Type of Home: House Home Access: Level entry     Home Layout: One level Home Equipment: Environmental consultant - 2 wheels Additional Comments: Patient lives with son who helps her at home. she uses a walker for mobility    Prior Function Level of Independence: Independent with assistive device(s)               Hand Dominance        Extremity/Trunk Assessment   Upper Extremity Assessment Upper Extremity Assessment: Overall WFL for tasks assessed    Lower Extremity Assessment Lower Extremity Assessment: Overall WFL for tasks assessed       Communication   Communication: No difficulties  Cognition Arousal/Alertness: Awake/alert Behavior During Therapy: WFL for tasks assessed/performed Overall Cognitive Status: Within Functional Limits for tasks assessed  General Comments      Exercises     Assessment/Plan    PT Assessment Patient needs continued PT services  PT Problem List Decreased strength;Decreased activity tolerance;Decreased mobility;Cardiopulmonary status limiting activity       PT Treatment Interventions Gait training;Functional mobility training;Therapeutic activities;Patient/family education;Therapeutic exercise    PT Goals (Current goals can be found in the Care Plan section)  Acute Rehab PT Goals Patient Stated Goal: to return home with son PT Goal Formulation: With patient Time For Goal Achievement: 03/29/19 Potential to Achieve Goals: Good    Frequency Min 2X/week   Barriers to discharge         Co-evaluation               AM-PAC PT "6 Clicks" Mobility  Outcome Measure Help needed turning from your back to your side while in a flat bed without using bedrails?: A Little Help needed moving from lying on your back to sitting on the side of a flat bed without using bedrails?: A Little Help needed moving to and from a bed to a chair (including a wheelchair)?: A Little Help needed standing up from a chair using your arms (e.g., wheelchair or bedside chair)?: A Little Help needed to walk in hospital room?: A Little Help needed climbing 3-5 steps with a railing? : A Little 6 Click Score: 18    End of Session Equipment Utilized During Treatment: Gait belt Activity Tolerance: Other (comment)(patient's HR increased from 106 to 140 during activity. Patient reports no difficulties. RN notified.) Patient left: in bed;with call bell/phone within reach;with bed alarm set Nurse Communication: Mobility status;Other (comment)(HR elevation) PT Visit Diagnosis: Difficulty in walking, not elsewhere classified (R26.2);Muscle weakness (generalized) (M62.81)    Time: 6295-2841 PT Time Calculation (min) (ACUTE ONLY): 25 min   Charges:   PT Evaluation $PT Eval Low Complexity: 1 Low PT Treatments $Gait Training: 8-22 mins        Lakendrick Paradis, PT, GCS 03/26/19,10:33 AM

## 2019-03-26 NOTE — TOC Transition Note (Signed)
Transition of Care [facility]) - CM/SW Discharge Note   Patient Details  Name: MICKEY HEBEL MRN: [id number] Date of Birth: 06/16/1935  Transition of [facility]) CM/SW Contact:  Tania Cobi Delph, LCSW Phone Number: 03/26/2019, 11:02 AM   Clinical Narrative:     Patient has been set up with [place] for PT and nurse's aide. CSW spoke with Melissa Plemmons from [place].  Patient notified, and patient's son notified.    Final next level of care: Home w Home Health Services Barriers to Discharge: Continued Medical Work up   Patient Goals and CMS Choice   CMS Medicare.gov Compare Post Acute Care list provided to:: Patient Choice offered to / list presented to : Patient  Discharge Placement                       Discharge Plan and Services   Discharge Planning Services: CM Consult Post Acute Care Choice: Home Health                    HH Arranged: PT, Nurse's Aide [facility] Agency: [place] [facility]) Date [facility] Agency Contacted: 03/26/19 Time HH Agency Contacted: 1000 Representative spoke with at [place]: [place]  Social Determinants of Health ([place]) Interventions     Readmission Risk Interventions No flowsheet data found.

## 2019-03-27 ENCOUNTER — Telehealth: Payer: Self-pay

## 2019-03-27 ENCOUNTER — Telehealth: Payer: Self-pay | Admitting: Internal Medicine

## 2019-03-27 ENCOUNTER — Encounter: Payer: Self-pay | Admitting: Internal Medicine

## 2019-03-27 DIAGNOSIS — Z79891 Long term (current) use of opiate analgesic: Secondary | ICD-10-CM | POA: Diagnosis not present

## 2019-03-27 DIAGNOSIS — Z9981 Dependence on supplemental oxygen: Secondary | ICD-10-CM | POA: Diagnosis not present

## 2019-03-27 DIAGNOSIS — I872 Venous insufficiency (chronic) (peripheral): Secondary | ICD-10-CM | POA: Diagnosis not present

## 2019-03-27 DIAGNOSIS — M810 Age-related osteoporosis without current pathological fracture: Secondary | ICD-10-CM | POA: Diagnosis not present

## 2019-03-27 DIAGNOSIS — E1151 Type 2 diabetes mellitus with diabetic peripheral angiopathy without gangrene: Secondary | ICD-10-CM | POA: Diagnosis not present

## 2019-03-27 DIAGNOSIS — Z853 Personal history of malignant neoplasm of breast: Secondary | ICD-10-CM | POA: Diagnosis not present

## 2019-03-27 DIAGNOSIS — G609 Hereditary and idiopathic neuropathy, unspecified: Secondary | ICD-10-CM | POA: Diagnosis not present

## 2019-03-27 DIAGNOSIS — Z86718 Personal history of other venous thrombosis and embolism: Secondary | ICD-10-CM | POA: Diagnosis not present

## 2019-03-27 DIAGNOSIS — M3213 Lung involvement in systemic lupus erythematosus: Secondary | ICD-10-CM | POA: Diagnosis not present

## 2019-03-27 DIAGNOSIS — G8929 Other chronic pain: Secondary | ICD-10-CM | POA: Diagnosis not present

## 2019-03-27 DIAGNOSIS — M4697 Unspecified inflammatory spondylopathy, lumbosacral region: Secondary | ICD-10-CM | POA: Diagnosis not present

## 2019-03-27 DIAGNOSIS — K210 Gastro-esophageal reflux disease with esophagitis: Secondary | ICD-10-CM | POA: Diagnosis not present

## 2019-03-27 DIAGNOSIS — M5441 Lumbago with sciatica, right side: Secondary | ICD-10-CM | POA: Diagnosis not present

## 2019-03-27 DIAGNOSIS — M419 Scoliosis, unspecified: Secondary | ICD-10-CM | POA: Diagnosis not present

## 2019-03-27 DIAGNOSIS — I270 Primary pulmonary hypertension: Secondary | ICD-10-CM | POA: Diagnosis not present

## 2019-03-27 DIAGNOSIS — D500 Iron deficiency anemia secondary to blood loss (chronic): Secondary | ICD-10-CM | POA: Diagnosis not present

## 2019-03-27 DIAGNOSIS — I5030 Unspecified diastolic (congestive) heart failure: Secondary | ICD-10-CM | POA: Diagnosis not present

## 2019-03-27 DIAGNOSIS — E1122 Type 2 diabetes mellitus with diabetic chronic kidney disease: Secondary | ICD-10-CM | POA: Diagnosis not present

## 2019-03-27 DIAGNOSIS — I4891 Unspecified atrial fibrillation: Secondary | ICD-10-CM | POA: Diagnosis not present

## 2019-03-27 DIAGNOSIS — Z8701 Personal history of pneumonia (recurrent): Secondary | ICD-10-CM | POA: Diagnosis not present

## 2019-03-27 DIAGNOSIS — Z7901 Long term (current) use of anticoagulants: Secondary | ICD-10-CM | POA: Diagnosis not present

## 2019-03-27 DIAGNOSIS — E782 Mixed hyperlipidemia: Secondary | ICD-10-CM | POA: Diagnosis not present

## 2019-03-27 DIAGNOSIS — I130 Hypertensive heart and chronic kidney disease with heart failure and stage 1 through stage 4 chronic kidney disease, or unspecified chronic kidney disease: Secondary | ICD-10-CM | POA: Diagnosis not present

## 2019-03-27 DIAGNOSIS — Z87891 Personal history of nicotine dependence: Secondary | ICD-10-CM | POA: Diagnosis not present

## 2019-03-27 DIAGNOSIS — N189 Chronic kidney disease, unspecified: Secondary | ICD-10-CM | POA: Diagnosis not present

## 2019-03-27 LAB — POCT INR

## 2019-03-27 NOTE — Telephone Encounter (Signed)
INR result from 03/27/2019 is 3.2. Pt is currently taking 2mg  daily.

## 2019-03-27 NOTE — Telephone Encounter (Signed)
Caller/Agency: Amy, RN with [facility]  Requesting skilled nursing  Frequency: 1w4 and 1 every other week for 5 weeks  Also requesting HH aide  Frequency: 1w1 3w2 2w1    Amy would also like to report level 1 drug interaction between hydroxychloroquine and ondansetron.   Amy contact:  [phone]  VM ok, secure.

## 2019-03-27 NOTE — Telephone Encounter (Signed)
Per Dr. Lupita Dawn verbal order I have let the pt know not to take her dose for tonight and then resume the 2mg  daily starting tomorrow. Pt gave a verbal understanding.

## 2019-03-27 NOTE — Telephone Encounter (Signed)
Verbal orders for skilled nursing, home health aide and OT evaluation was given.

## 2019-04-01 ENCOUNTER — Other Ambulatory Visit: Payer: Self-pay

## 2019-04-01 ENCOUNTER — Encounter: Payer: Self-pay | Admitting: Gastroenterology

## 2019-04-01 ENCOUNTER — Encounter (INDEPENDENT_AMBULATORY_CARE_PROVIDER_SITE_OTHER): Payer: Self-pay

## 2019-04-01 ENCOUNTER — Ambulatory Visit (INDEPENDENT_AMBULATORY_CARE_PROVIDER_SITE_OTHER): Payer: Medicare Other | Admitting: Gastroenterology

## 2019-04-01 ENCOUNTER — Telehealth: Payer: Self-pay

## 2019-04-01 VITALS — BP 112/65 | HR 82 | Temp 98.4°F | Ht 61.0 in | Wt 106.6 lb

## 2019-04-01 DIAGNOSIS — K222 Esophageal obstruction: Secondary | ICD-10-CM

## 2019-04-01 DIAGNOSIS — R4702 Dysphasia: Secondary | ICD-10-CM | POA: Diagnosis not present

## 2019-04-01 NOTE — H&P (View-Only) (Signed)
Primary Care Physician: Crecencio Mc, MD  Primary Gastroenterologist:  Dr. Lucilla [name]  Chief Complaint  Patient presents with  . Hospitalization Follow-up  . Anemia    HPI: Diana [name] is a 84 y.o. female here for follow-up after being admitted to the hospital for dysphasia with food impaction.  The patient had an EGD with removal of the food.  She is now here for follow-up. The patient reports that she still has trouble swallowing.  She also states that she has lost a significant amount of weight because of her problems swallowing.  There is no report of any black stools or bloody stools.  She had food removed from her esophagus but her esophagus was not stretched at that time due to active ongoing inflammation.  The patient states that her diverticulitis is much better at this time  Current Outpatient Medications  Medication Sig Dispense Refill  . Ascorbic Acid (VITAMIN C) 1000 MG tablet Take 1,000 mg by mouth daily.    . budesonide-formoterol (SYMBICORT) 160-4.5 MCG/ACT inhaler Symbicort 160 mcg-4.5 mcg/actuation HFA aerosol inhaler    . Calcium Carbonate-Vitamin D 600-400 MG-UNIT tablet Take 1 tablet by mouth daily.    . cyanocobalamin (,VITAMIN B-12,) 1000 MCG/ML injection Inject 1 mL (1,000 mcg total) into the muscle every 30 (thirty) days. Pt uses on the 1st of every month. 10 mL 1  . fentaNYL (DURAGESIC) 50 MCG/HR Place 1 patch onto the skin every 3 (three) days. 10 patch 0  . hydroxychloroquine (PLAQUENIL) 200 MG tablet Take 200 mg by mouth daily.    Marland Kitchen letrozole (FEMARA) 2.5 MG tablet TAKE 1 TABLET BY MOUTH EVERY DAY 90 tablet 1  . levothyroxine (SYNTHROID) 112 MCG tablet Take 1 tablet (112 mcg total) by mouth daily. 30 tablet 0  . losartan (COZAAR) 25 MG tablet Take 1 tablet (25 mg total) by mouth at bedtime. 30 tablet 0  . metoCLOPramide (REGLAN) 10 MG tablet TAKE 1 TABLET BY MOUTH 4 TIMES DAILY - BEFORE MEALS AND AT BEDTIME. 360 tablet 0  . metoprolol succinate  (TOPROL-XL) 25 MG 24 hr tablet Take 12.5 mg by mouth daily.     . mycophenolate (CELLCEPT) 500 MG tablet Take 1,000 mg by mouth 2 (two) times daily.    . ondansetron (ZOFRAN ODT) 4 MG disintegrating tablet Take 1 tablet (4 mg total) by mouth every 8 (eight) hours as needed for nausea or vomiting. 20 tablet 0  . oxyCODONE-acetaminophen (PERCOCET/ROXICET) 5-325 MG tablet Take 1 tablet by mouth 2 (two) times daily as needed for severe pain. 60 tablet 0  . OXYGEN Inhale 2 mLs into the lungs at bedtime.    . pantoprazole (PROTONIX) 40 MG tablet Take 40 mg by mouth 2 (two) times daily.    . Polyvinyl Alcohol-Povidone (REFRESH OP) Place 2 drops into both eyes daily as needed (dry eyes).     . predniSONE (DELTASONE) 5 MG tablet Take 5 mg by mouth daily.     . prochlorperazine (COMPAZINE) 5 MG tablet Take 1 tablet (5 mg total) by mouth every 6 (six) hours as needed for nausea or vomiting. 30 tablet 5  . Saccharomyces boulardii (PROBIOTIC) 250 MG CAPS Take 1 tablet by mouth daily. 14 capsule   . sucralfate (CARAFATE) 1 GM/10ML suspension Take 1 g by mouth 4 (four) times daily. Before meals and nightly    . Syringe/Needle, Disp, (SYRINGE 3CC/25GX1") 25G X 1" 3 ML MISC Use for b12 injections 50 each 0  . Tadalafil,  PAH, (ADCIRCA) 20 MG TABS Take 40 mg by mouth daily.    Marland Kitchen warfarin (COUMADIN) 2 MG tablet Take 1 tablet (2 mg total) by mouth daily for 30 days. 30 tablet 0  . ALPRAZolam (XANAX) 0.25 MG tablet Take 1 tablet (0.25 mg total) by mouth 2 (two) times daily as needed for anxiety. (Patient not taking: Reported on 04/01/2019) 20 tablet 5  . mupirocin ointment (BACTROBAN) 2 % Apply 1 application topically 2 (two) times daily. Apply thin layer to affected area on leg (Patient not taking: Reported on 04/01/2019) 22 g 0   No current facility-administered medications for this visit.    Facility-Administered Medications Ordered in Other Visits  Medication Dose Route Frequency Provider Last Rate Last Dose  .  Darbepoetin Alfa (ARANESP) injection 300 mcg  300 mcg Subcutaneous Weekly Sindy Guadeloupe, MD   300 mcg at 02/04/19 1512  . Darbepoetin Alfa (ARANESP) injection 300 mcg  300 mcg Subcutaneous Weekly Sindy Guadeloupe, MD   300 mcg at 02/25/19 1455    Allergies as of 04/01/2019 - Review Complete 04/01/2019  Allergen Reaction Noted  . Iodinated diagnostic agents Hives 01/01/2013  . Tramadol Other (See Comments) and Rash 01/01/2013  . Amoxicillin Other (See Comments)   . Cefuroxime Itching   . Metrizamide Hives 08/03/2015  . Morphine and related  07/17/2015  . Propoxyphene  08/03/2015  . Cephalexin Rash   . Sulfa antibiotics Rash 01/01/2013  . Sulfonamide derivatives Rash   . Tramadol hcl Rash   . Venlafaxine Rash     ROS:  General: Negative for anorexia, weight loss, fever, chills, fatigue, weakness. ENT: Negative for hoarseness, difficulty swallowing , nasal congestion. CV: Negative for chest pain, angina, palpitations, dyspnea on exertion, peripheral edema.  Respiratory: Negative for dyspnea at rest, dyspnea on exertion, cough, sputum, wheezing.  GI: See history of present illness. GU:  Negative for dysuria, hematuria, urinary incontinence, urinary frequency, nocturnal urination.  Endo: Negative for unusual weight change.    Physical Examination:   BP 112/65   Pulse 82   Temp 98.4 F (36.9 C) (Oral)   Ht 5\' 1"  (1.549 m)   Wt 106 lb 9.6 oz (48.4 kg)   BMI 20.14 kg/m   General: Well-nourished, well-developed in no acute distress.  Eyes: No icterus. Conjunctivae pink. Extremities: No lower extremity edema. No clubbing or deformities. Neuro: Alert and oriented x 3.  Grossly intact. Skin: Warm and dry, no jaundice.   Psych: Alert and cooperative, normal mood and affect.  Labs:    Imaging Studies: Ct Abdomen Pelvis Wo Contrast  Result Date: 03/10/2019 CLINICAL DATA:  Vomiting. EXAM: CT ABDOMEN AND PELVIS WITHOUT CONTRAST TECHNIQUE: Multidetector CT imaging of the abdomen and  pelvis was performed following the standard protocol without IV contrast. COMPARISON:  07/13/2018 FINDINGS: Lower chest: Bibasilar atelectasis. Large hiatal hernia. Calcific atherosclerotic disease of the coronary arteries. Hepatobiliary: No focal liver abnormality is seen. No gallstones, gallbladder wall thickening, or biliary dilatation. Pancreas: Atrophic pancreas. Spleen: The spleen is surgically. Adrenals/Urinary Tract: Normal adrenal glands. Bilateral renal cysts, the largest in the midpole region of the left kidney measuring 3.1 cm. Normal urinary bladder. Stomach/Bowel: Large hiatal hernia with diffuse circumferential mucosal thickening of the gastric mucosa within the hernial sac and in the anatomic location of the stomach. No evidence of small-bowel obstruction. Extensive colonic diverticulosis. Long segment of mucosal thickening of the ascending, transverse and sigmoid colon likely represent diverticulitis or other forms of colitis. Vascular/Lymphatic: Calcific atherosclerotic disease and severe  tortuosity of the aorta. Reproductive: Uterus and bilateral adnexa are unremarkable. Other: No abdominal wall hernia or abnormality. No abdominopelvic ascites. Musculoskeletal: Osteopenia. Scoliosis. Prior vertebroplasty. Diffuse spondylosis of the lumbosacral spine. IMPRESSION: 1. Large hiatal hernia with diffuse circumferential mucosal thickening of the gastric mucosa within the hernial sac and in the anatomic location of the stomach. This may represent diffuse gastritis, however gastric malignancy cannot be entirely excluded. 2. Extensive colonic diverticulosis. Long segment of mucosal thickening of the ascending, transverse and sigmoid colon, likely representing diverticulitis or other form of colitis. 3. Calcific atherosclerotic disease of the aorta and marked tortuosity. Electronically Signed   By: Fidela Salisbury M.D.   On: 03/10/2019 19:12   Dg Lumbar Spine 2-3 Views  Result Date: 03/25/2019  CLINICAL DATA:  Leg pain and swelling, initial encounter EXAM: LUMBAR SPINE - 3 VIEW COMPARISON:  03/10/2019 FINDINGS: Five lumbar type vertebral bodies are well visualized. Vertebral body height is well maintained. Changes of prior augmentation at L1 and L2 are seen. Disc space narrowing throughout the lumbar spine is noted. Scoliosis concave to the left is seen and stable. Visualized pelvis is within normal limits. IMPRESSION: No acute abnormality noted. Electronically Signed   By: Inez Catalina M.D.   On: 03/25/2019 18:20   US Venous Img Lower Unilateral Left  Result Date: 03/11/2019 CLINICAL DATA:  Left lower extremity edema. History of prior lower extremity DVT. EXAM: LEFT LOWER EXTREMITY VENOUS DOPPLER ULTRASOUND TECHNIQUE: Gray-scale sonography with graded compression, as well as color Doppler and duplex ultrasound were performed to evaluate the lower extremity deep venous systems from the level of the common femoral vein and including the common femoral, femoral, profunda femoral, popliteal and calf veins including the posterior tibial, peroneal and gastrocnemius veins when visible. The superficial great saphenous vein was also interrogated. Spectral Doppler was utilized to evaluate flow at rest and with distal augmentation maneuvers in the common femoral, femoral and popliteal veins. COMPARISON:  06/11/2015 FINDINGS: Contralateral Common Femoral Vein: Respiratory phasicity is normal and symmetric with the symptomatic side. No evidence of thrombus. Normal compressibility. Common Femoral Vein: No evidence of thrombus. Normal compressibility, respiratory phasicity and response to augmentation. Saphenofemoral Junction: No evidence of thrombus. Normal compressibility and flow on color Doppler imaging. Profunda Femoral Vein: No evidence of thrombus. Normal compressibility and flow on color Doppler imaging. Femoral Vein: No evidence of thrombus. Normal compressibility, respiratory phasicity and response to  augmentation. Popliteal Vein: No evidence of thrombus. Normal compressibility, respiratory phasicity and response to augmentation. Calf Veins: No evidence of thrombus. Normal compressibility and flow on color Doppler imaging. Superficial Great Saphenous Vein: No evidence of thrombus. Normal compressibility. Venous Reflux:  None. Other Findings: No evidence of superficial thrombophlebitis or abnormal fluid collection. IMPRESSION: No evidence of left lower extremity deep venous thrombosis. Electronically Signed   By: Aletta Edouard M.D.   On: 03/11/2019 09:40   Dg Chest Port 1 View  Result Date: 03/10/2019 CLINICAL DATA:  Vomiting. Weakness. EXAM: PORTABLE CHEST 1 VIEW COMPARISON:  03/31/2015 FINDINGS: Cardiomediastinal silhouette is enlarged. Mediastinal contours appear intact. Tortuosity and calcific atherosclerotic disease of the aorta. Large hiatal hernia. There is no evidence of focal airspace consolidation, pleural effusion or pneumothorax. Osseous structures are without acute abnormality. Soft tissues are grossly normal. IMPRESSION: 1. Enlarged cardiac silhouette. 2. Large hiatal hernia. Electronically Signed   By: Fidela Salisbury M.D.   On: 03/10/2019 19:14    Assessment and Plan:   ANYELA NAPIERKOWSKI is a 84 y.o. y/o female who  had a food bolus impaction when she was in the hospital and had an EGD with the removal of the food.  The patient has been doing well from the point of view of diverticulitis but states that she still cannot eat because of her food getting stuck on the way down.  The patient will be set up for an EGD after we get clearance to stop her Coumadin for 5 days. I have discussed risks & benefits which include, but are not limited to, bleeding, infection, perforation & drug reaction.  The patient agrees with this plan & written consent will be obtained.       Diana Lame, MD. Marval Regal   Note: This dictation was prepared with Dragon dictation along with smaller phrase  technology. Any transcriptional errors that result from this process are unintentional.

## 2019-04-01 NOTE — Telephone Encounter (Signed)
Wanted to verify that it is ok to give verbals for OT since pt is having symptoms of lower extremity swelling in both ankles.  Do I need to schedule an appt with Dr. Derrel Nip as well?

## 2019-04-01 NOTE — Progress Notes (Signed)
Primary Care Physician: Crecencio Mc, MD  Primary Gastroenterologist:  Dr. [name]  Chief Complaint  Patient presents with  . Hospitalization Follow-up  . Anemia    HPI: Diana [name] is a 84 y.o. female here for follow-up after being admitted to the hospital for dysphasia with food impaction.  The patient had an EGD with removal of the food.  She is now here for follow-up. The patient reports that she still has trouble swallowing.  She also states that she has lost a significant amount of weight because of her problems swallowing.  There is no report of any black stools or bloody stools.  She had food removed from her esophagus but her esophagus was not stretched at that time due to active ongoing inflammation.  The patient states that her diverticulitis is much better at this time  Current Outpatient Medications  Medication Sig Dispense Refill  . Ascorbic Acid (VITAMIN C) 1000 MG tablet Take 1,000 mg by mouth daily.    . budesonide-formoterol (SYMBICORT) 160-4.5 MCG/ACT inhaler Symbicort 160 mcg-4.5 mcg/actuation HFA aerosol inhaler    . Calcium Carbonate-Vitamin D 600-400 MG-UNIT tablet Take 1 tablet by mouth daily.    . cyanocobalamin (,VITAMIN B-12,) 1000 MCG/ML injection Inject 1 mL (1,000 mcg total) into the muscle every 30 (thirty) days. Pt uses on the 1st of every month. 10 mL 1  . fentaNYL (DURAGESIC) 50 MCG/HR Place 1 patch onto the skin every 3 (three) days. 10 patch 0  . hydroxychloroquine (PLAQUENIL) 200 MG tablet Take 200 mg by mouth daily.    Marland Kitchen letrozole (FEMARA) 2.5 MG tablet TAKE 1 TABLET BY MOUTH EVERY DAY 90 tablet 1  . levothyroxine (SYNTHROID) 112 MCG tablet Take 1 tablet (112 mcg total) by mouth daily. 30 tablet 0  . losartan (COZAAR) 25 MG tablet Take 1 tablet (25 mg total) by mouth at bedtime. 30 tablet 0  . metoCLOPramide (REGLAN) 10 MG tablet TAKE 1 TABLET BY MOUTH 4 TIMES DAILY - BEFORE MEALS AND AT BEDTIME. 360 tablet 0  . metoprolol succinate  (TOPROL-XL) 25 MG 24 hr tablet Take 12.5 mg by mouth daily.     . mycophenolate (CELLCEPT) 500 MG tablet Take 1,000 mg by mouth 2 (two) times daily.    . ondansetron (ZOFRAN ODT) 4 MG disintegrating tablet Take 1 tablet (4 mg total) by mouth every 8 (eight) hours as needed for nausea or vomiting. 20 tablet 0  . oxyCODONE-acetaminophen (PERCOCET/ROXICET) 5-325 MG tablet Take 1 tablet by mouth 2 (two) times daily as needed for severe pain. 60 tablet 0  . OXYGEN Inhale 2 mLs into the lungs at bedtime.    . pantoprazole (PROTONIX) 40 MG tablet Take 40 mg by mouth 2 (two) times daily.    . Polyvinyl Alcohol-Povidone (REFRESH OP) Place 2 drops into both eyes daily as needed (dry eyes).     . predniSONE (DELTASONE) 5 MG tablet Take 5 mg by mouth daily.     . prochlorperazine (COMPAZINE) 5 MG tablet Take 1 tablet (5 mg total) by mouth every 6 (six) hours as needed for nausea or vomiting. 30 tablet 5  . Saccharomyces boulardii (PROBIOTIC) 250 MG CAPS Take 1 tablet by mouth daily. 14 capsule   . sucralfate (CARAFATE) 1 GM/10ML suspension Take 1 g by mouth 4 (four) times daily. Before meals and nightly    . Syringe/Needle, Disp, (SYRINGE 3CC/25GX1") 25G X 1" 3 ML MISC Use for b12 injections 50 each 0  . Tadalafil,  PAH, (ADCIRCA) 20 MG TABS Take 40 mg by mouth daily.    Marland Kitchen warfarin (COUMADIN) 2 MG tablet Take 1 tablet (2 mg total) by mouth daily for 30 days. 30 tablet 0  . ALPRAZolam (XANAX) 0.25 MG tablet Take 1 tablet (0.25 mg total) by mouth 2 (two) times daily as needed for anxiety. (Patient not taking: Reported on 04/01/2019) 20 tablet 5  . mupirocin ointment (BACTROBAN) 2 % Apply 1 application topically 2 (two) times daily. Apply thin layer to affected area on leg (Patient not taking: Reported on 04/01/2019) 22 g 0   No current facility-administered medications for this visit.    Facility-Administered Medications Ordered in Other Visits  Medication Dose Route Frequency Provider Last Rate Last Dose  .  Darbepoetin Alfa (ARANESP) injection 300 mcg  300 mcg Subcutaneous Weekly Sindy Guadeloupe, MD   300 mcg at 02/04/19 1512  . Darbepoetin Alfa (ARANESP) injection 300 mcg  300 mcg Subcutaneous Weekly Sindy Guadeloupe, MD   300 mcg at 02/25/19 1455    Allergies as of 04/01/2019 - Review Complete 04/01/2019  Allergen Reaction Noted  . Iodinated diagnostic agents Hives 01/01/2013  . Tramadol Other (See Comments) and Rash 01/01/2013  . Amoxicillin Other (See Comments)   . Cefuroxime Itching   . Metrizamide Hives 08/03/2015  . Morphine and related  07/17/2015  . Propoxyphene  08/03/2015  . Cephalexin Rash   . Sulfa antibiotics Rash 01/01/2013  . Sulfonamide derivatives Rash   . Tramadol hcl Rash   . Venlafaxine Rash     ROS:  General: Negative for anorexia, weight loss, fever, chills, fatigue, weakness. ENT: Negative for hoarseness, difficulty swallowing , nasal congestion. CV: Negative for chest pain, angina, palpitations, dyspnea on exertion, peripheral edema.  Respiratory: Negative for dyspnea at rest, dyspnea on exertion, cough, sputum, wheezing.  GI: See history of present illness. GU:  Negative for dysuria, hematuria, urinary incontinence, urinary frequency, nocturnal urination.  Endo: Negative for unusual weight change.    Physical Examination:   BP 112/65   Pulse 82   Temp 98.4 F (36.9 C) (Oral)   Ht 5\' 1"  (1.549 m)   Wt 106 lb 9.6 oz (48.4 kg)   BMI 20.14 kg/m   General: Well-nourished, well-developed in no acute distress.  Eyes: No icterus. Conjunctivae pink. Extremities: No lower extremity edema. No clubbing or deformities. Neuro: Alert and oriented x 3.  Grossly intact. Skin: Warm and dry, no jaundice.   Psych: Alert and cooperative, normal mood and affect.  Labs:    Imaging Studies: Ct Abdomen Pelvis Wo Contrast  Result Date: 03/10/2019 CLINICAL DATA:  Vomiting. EXAM: CT ABDOMEN AND PELVIS WITHOUT CONTRAST TECHNIQUE: Multidetector CT imaging of the abdomen and  pelvis was performed following the standard protocol without IV contrast. COMPARISON:  07/13/2018 FINDINGS: Lower chest: Bibasilar atelectasis. Large hiatal hernia. Calcific atherosclerotic disease of the coronary arteries. Hepatobiliary: No focal liver abnormality is seen. No gallstones, gallbladder wall thickening, or biliary dilatation. Pancreas: Atrophic pancreas. Spleen: The spleen is surgically. Adrenals/Urinary Tract: Normal adrenal glands. Bilateral renal cysts, the largest in the midpole region of the left kidney measuring 3.1 cm. Normal urinary bladder. Stomach/Bowel: Large hiatal hernia with diffuse circumferential mucosal thickening of the gastric mucosa within the hernial sac and in the anatomic location of the stomach. No evidence of small-bowel obstruction. Extensive colonic diverticulosis. Long segment of mucosal thickening of the ascending, transverse and sigmoid colon likely represent diverticulitis or other forms of colitis. Vascular/Lymphatic: Calcific atherosclerotic disease and severe  tortuosity of the aorta. Reproductive: Uterus and bilateral adnexa are unremarkable. Other: No abdominal wall hernia or abnormality. No abdominopelvic ascites. Musculoskeletal: Osteopenia. Scoliosis. Prior vertebroplasty. Diffuse spondylosis of the lumbosacral spine. IMPRESSION: 1. Large hiatal hernia with diffuse circumferential mucosal thickening of the gastric mucosa within the hernial sac and in the anatomic location of the stomach. This may represent diffuse gastritis, however gastric malignancy cannot be entirely excluded. 2. Extensive colonic diverticulosis. Long segment of mucosal thickening of the ascending, transverse and sigmoid colon, likely representing diverticulitis or other form of colitis. 3. Calcific atherosclerotic disease of the aorta and marked tortuosity. Electronically Signed   By: Fidela Salisbury M.D.   On: 03/10/2019 19:12   Dg Lumbar Spine 2-3 Views  Result Date: 03/25/2019  CLINICAL DATA:  Leg pain and swelling, initial encounter EXAM: LUMBAR SPINE - 3 VIEW COMPARISON:  03/10/2019 FINDINGS: Five lumbar type vertebral bodies are well visualized. Vertebral body height is well maintained. Changes of prior augmentation at L1 and L2 are seen. Disc space narrowing throughout the lumbar spine is noted. Scoliosis concave to the left is seen and stable. Visualized pelvis is within normal limits. IMPRESSION: No acute abnormality noted. Electronically Signed   By: Inez Catalina M.D.   On: 03/25/2019 18:20   US Venous Img Lower Unilateral Left  Result Date: 03/11/2019 CLINICAL DATA:  Left lower extremity edema. History of prior lower extremity DVT. EXAM: LEFT LOWER EXTREMITY VENOUS DOPPLER ULTRASOUND TECHNIQUE: Gray-scale sonography with graded compression, as well as color Doppler and duplex ultrasound were performed to evaluate the lower extremity deep venous systems from the level of the common femoral vein and including the common femoral, femoral, profunda femoral, popliteal and calf veins including the posterior tibial, peroneal and gastrocnemius veins when visible. The superficial great saphenous vein was also interrogated. Spectral Doppler was utilized to evaluate flow at rest and with distal augmentation maneuvers in the common femoral, femoral and popliteal veins. COMPARISON:  06/11/2015 FINDINGS: Contralateral Common Femoral Vein: Respiratory phasicity is normal and symmetric with the symptomatic side. No evidence of thrombus. Normal compressibility. Common Femoral Vein: No evidence of thrombus. Normal compressibility, respiratory phasicity and response to augmentation. Saphenofemoral Junction: No evidence of thrombus. Normal compressibility and flow on color Doppler imaging. Profunda Femoral Vein: No evidence of thrombus. Normal compressibility and flow on color Doppler imaging. Femoral Vein: No evidence of thrombus. Normal compressibility, respiratory phasicity and response to  augmentation. Popliteal Vein: No evidence of thrombus. Normal compressibility, respiratory phasicity and response to augmentation. Calf Veins: No evidence of thrombus. Normal compressibility and flow on color Doppler imaging. Superficial Great Saphenous Vein: No evidence of thrombus. Normal compressibility. Venous Reflux:  None. Other Findings: No evidence of superficial thrombophlebitis or abnormal fluid collection. IMPRESSION: No evidence of left lower extremity deep venous thrombosis. Electronically Signed   By: Aletta Edouard M.D.   On: 03/11/2019 09:40   Dg Chest Port 1 View  Result Date: 03/10/2019 CLINICAL DATA:  Vomiting. Weakness. EXAM: PORTABLE CHEST 1 VIEW COMPARISON:  03/31/2015 FINDINGS: Cardiomediastinal silhouette is enlarged. Mediastinal contours appear intact. Tortuosity and calcific atherosclerotic disease of the aorta. Large hiatal hernia. There is no evidence of focal airspace consolidation, pleural effusion or pneumothorax. Osseous structures are without acute abnormality. Soft tissues are grossly normal. IMPRESSION: 1. Enlarged cardiac silhouette. 2. Large hiatal hernia. Electronically Signed   By: Fidela Salisbury M.D.   On: 03/10/2019 19:14    Assessment and Plan:   Diana [name] is a 83 y.o. y/o female who  had a food bolus impaction when she was in the hospital and had an EGD with the removal of the food.  The patient has been doing well from the point of view of diverticulitis but states that she still cannot eat because of her food getting stuck on the way down.  The patient will be set up for an EGD after we get clearance to stop her Coumadin for 5 days. I have discussed risks & benefits which include, but are not limited to, bleeding, infection, perforation & drug reaction.  The patient agrees with this plan & written consent will be obtained.       [name], MD. Marval Regal   Note: This dictation was prepared with Dragon dictation along with smaller phrase  technology. Any transcriptional errors that result from this process are unintentional.

## 2019-04-01 NOTE — Telephone Encounter (Signed)
Copied from [place] [id number]. Topic: Quick Communication - Home Health Verbal Orders >> Apr 01, 2019  3:13 PM Virl Axe D wrote: Caller/Agency: [place] Number: [phone] Secure VM Requesting OT/PT/Skilled Nursing/Social Work/Speech Therapy: OT Frequency:2 week 2/ 1 week 2  Bilateral lower extremity swelling both ankles approximately 25 cm

## 2019-04-01 NOTE — Telephone Encounter (Signed)
Verbal orders for OT were given.

## 2019-04-03 ENCOUNTER — Telehealth: Payer: Self-pay

## 2019-04-03 ENCOUNTER — Other Ambulatory Visit: Payer: Self-pay | Admitting: Internal Medicine

## 2019-04-03 LAB — POCT INR: INR: 3.6 — AB (ref 0.9–1.1)

## 2019-04-03 NOTE — Telephone Encounter (Signed)
Looks like this medication has been discontinued.

## 2019-04-03 NOTE — Telephone Encounter (Signed)
Called and spoke to [employer] at [employer].  Gave verbal orders for PT as requested 2 week for 4 wks and 1 wk for 3 wks.

## 2019-04-03 NOTE — Telephone Encounter (Signed)
Copied from [place] [id number]. Topic: Quick Communication - Home Health Verbal Orders >> Apr 02, 2019  5:00 PM [place] wrote: CRM for notification. See Telephone encounter for: 04/02/19.  [place]  call back [phone] for PT at 2 a week for four wks and 1 a wk for 3 wks

## 2019-04-03 NOTE — Telephone Encounter (Signed)
Refilled for prn use

## 2019-04-04 ENCOUNTER — Other Ambulatory Visit: Payer: Self-pay | Admitting: Internal Medicine

## 2019-04-04 ENCOUNTER — Telehealth: Payer: Self-pay

## 2019-04-04 ENCOUNTER — Other Ambulatory Visit: Payer: Self-pay

## 2019-04-04 ENCOUNTER — Encounter: Payer: Self-pay | Admitting: *Deleted

## 2019-04-04 NOTE — Telephone Encounter (Signed)
Pt's INR from 04/03/2019 is 3.6. Pt is currently taking 2mg  daily.  Abstracted.

## 2019-04-04 NOTE — Telephone Encounter (Signed)
Spoke with pt and informed her of the new coumadin regimen. Pt gave a verbal understanding.

## 2019-04-04 NOTE — Telephone Encounter (Signed)
Hold dose tonight  Resume dose 2 mg daily tomorrow Friday except on Sundays take 1/2 pill =1 mg mg  Check INR as scheduled weekly   [place]

## 2019-04-05 ENCOUNTER — Telehealth: Payer: Self-pay | Admitting: *Deleted

## 2019-04-05 NOTE — Telephone Encounter (Signed)
Ok to stop warfarin for procedure

## 2019-04-05 NOTE — Telephone Encounter (Signed)
Patient notified and voiced understaniing

## 2019-04-05 NOTE — Telephone Encounter (Signed)
Patient is having her esophagus stretched on 04/11/19 and was advised she needs clearance to stop coumadin 5 days prior to procedure.

## 2019-04-05 NOTE — Anesthesia Preprocedure Evaluation (Addendum)
Anesthesia Evaluation    History of Anesthesia Complications Negative for: history of anesthetic complications  Airway Mallampati: II       Dental  (+) Dental Advidsory Given   Pulmonary neg pulmonary ROS, neg shortness of breath, neg recent URI, former smoker,  History of mild (?) PAH.  No recent cath or echo.   Pulmonary exam normal        Cardiovascular Exercise Tolerance: Poor hypertension, (-) angina+CHF  (-) Past MI Normal cardiovascular exam+ dysrhythmias (-) Valvular Problems/Murmurs     Neuro/Psych    GI/Hepatic Neg liver ROS, hiatal hernia, PUD, GERD  ,  Endo/Other  diabetesHypothyroidism   Renal/GU Renal InsufficiencyRenal disease     Musculoskeletal   Abdominal   Peds  Hematology Hx of Lupus Hx of DVT and past use of IVC filter   Anesthesia Other Findings Past Medical History: No date: Acute glomerulonephritis with other specified pathological  lesion in kidney in disease classified elsewhere(580.81) No date: Alveolar aeration decreased No date: Anemia No date: Arthritis No date: Atrial fibrillation ([place]) 06/03/2014: Breast cancer ([place])     Comment:  positive, radiation No date: Cancer ([place])     Comment:  Breast No date: CHF (congestive heart failure) (HCC) No date: Diaphragmatic hernia No date: DVT (deep venous thrombosis) (H[facility]) No date: Dysrhythmia No date: Environmental allergies 03/28/2015: Erosive esophagitis     Comment:  esophageal biopsy July 2017 c/w CMV cytopathic changes,               negative for H Pylori and dysplasia    No date: Esophageal reflux 05/26/2015: Esophagitis, CMV (HCC)     Comment:  Diagnosed at [facility] with manometry,  Tube feeds started for              enteral nutrtion 05/12/15  No date: Feeding problem     Comment:  FEEDING TUBE No date: Hiatal hernia No date: Hyperlipidemia No date: Hypertension No date: Lower extremity edema No date: Lupus (systemic lupus  erythematosus) (HCC) No date: Neuropathy No date: Osteopenia No date: Oxygen deficiency     Comment:  USES HS No date: Peripheral neuropathy, hereditary/idiopathic No date: Personal history of radiation therapy No date: Pneumonia No date: Primary pulmonary hypertension ([place]) No date: Renal insufficiency No date: Sciatica No date: Shingles March 2013: Shingles (herpes zoster) polyneuropathy     Comment:  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 No date: Shortness of breath dyspnea No date: Unspecified hypothyroidism No date: Unspecified menopausal and postmenopausal disorder No date: Urinary incontinence without sensory awareness No date: Vitamin B deficiency   Reproductive/Obstetrics                            Anesthesia Physical Anesthesia Plan  ASA: III  Anesthesia Plan: General   Post-op Pain Management:    Induction: Intravenous  PONV Risk Score and Plan: 3 and Propofol infusion and TIVA  Airway Management Planned: Natural Airway and Nasal Cannula  Additional Equipment:   Intra-op Plan:   Post-operative Plan:   Informed Consent: I have reviewed the patients History and Physical, chart, labs and discussed the procedure including the risks, benefits and alternatives for the proposed anesthesia with the patient or authorized representative who has indicated his/her understanding and acceptance.     Dental Advisory Given  Plan Discussed with:   Anesthesia Plan Comments:         Anesthesia Quick Evaluation

## 2019-04-05 NOTE — Telephone Encounter (Signed)
Copied from [place]. Topic: General - Other >> Apr 05, 2019  2:51 PM Mcneil, Ja-Kwan wrote: Reason for CRM: Pt stated she will be having a procedure done on 04/11/19 and she was told she would need medical clearance to stop taking the Coumadin 5 days prior to procedure. Pt requests call back. Cb# [phone]

## 2019-04-08 ENCOUNTER — Telehealth: Payer: Self-pay | Admitting: Gastroenterology

## 2019-04-08 ENCOUNTER — Inpatient Hospital Stay: Payer: Medicare Other | Attending: Oncology

## 2019-04-08 ENCOUNTER — Other Ambulatory Visit: Payer: Self-pay | Admitting: Internal Medicine

## 2019-04-08 ENCOUNTER — Other Ambulatory Visit
Admission: RE | Admit: 2019-04-08 | Discharge: 2019-04-08 | Disposition: A | Discharge status: Home / Self Care | Payer: Medicare Other | Source: Ambulatory Visit | Attending: Gastroenterology | Admitting: Gastroenterology

## 2019-04-08 ENCOUNTER — Other Ambulatory Visit: Payer: Self-pay

## 2019-04-08 DIAGNOSIS — Z1159 Encounter for screening for other viral diseases: Secondary | ICD-10-CM | POA: Insufficient documentation

## 2019-04-08 DIAGNOSIS — N189 Chronic kidney disease, unspecified: Secondary | ICD-10-CM | POA: Insufficient documentation

## 2019-04-08 DIAGNOSIS — D631 Anemia in chronic kidney disease: Secondary | ICD-10-CM | POA: Insufficient documentation

## 2019-04-08 LAB — SARS CORONAVIRUS 2 (TAT 6-24 HRS): SARS Coronavirus 2: NEGATIVE

## 2019-04-08 NOTE — Telephone Encounter (Signed)
Pt is calling she did her Covid19 testing this morning and was supposed to go home to [place] she states she left the house to get a shot and is supposed to let us know.

## 2019-04-10 ENCOUNTER — Telehealth: Payer: Self-pay | Admitting: Gastroenterology

## 2019-04-10 LAB — POCT INR: INR: 1.3 — AB (ref 0.9–1.1)

## 2019-04-10 MED ORDER — TADALAFIL 20 MG TABLET (PULMONARY HYPERTENSION)
ORAL_TABLET | Freq: Two times a day (BID) | ORAL | 11 refills | 30 days | Status: CP
Start: 2019-04-10 — End: ?

## 2019-04-10 NOTE — Telephone Encounter (Signed)
Tabitha called from the [facility] & states patient needs clearance in order for Them to do the patient procedure. Please call her & let her know when you get in touch with the patient.

## 2019-04-10 NOTE — Discharge Instructions (Signed)

## 2019-04-11 ENCOUNTER — Ambulatory Visit
Admission: RE | Admit: 2019-04-11 | Discharge: 2019-04-11 | Disposition: A | Discharge status: Home / Self Care | Payer: Medicare Other | Attending: Gastroenterology | Admitting: Gastroenterology

## 2019-04-11 ENCOUNTER — Encounter: Payer: Self-pay | Admitting: Anesthesiology

## 2019-04-11 ENCOUNTER — Other Ambulatory Visit: Payer: Self-pay

## 2019-04-11 ENCOUNTER — Encounter
Admission: RE | Disposition: A | Discharge status: Home / Self Care | Payer: Self-pay | Source: Home / Self Care | Attending: Gastroenterology

## 2019-04-11 DIAGNOSIS — Z882 Allergy status to sulfonamides status: Secondary | ICD-10-CM | POA: Diagnosis not present

## 2019-04-11 DIAGNOSIS — R1310 Dysphagia, unspecified: Secondary | ICD-10-CM | POA: Diagnosis present

## 2019-04-11 DIAGNOSIS — Z853 Personal history of malignant neoplasm of breast: Secondary | ICD-10-CM | POA: Diagnosis not present

## 2019-04-11 DIAGNOSIS — Z87891 Personal history of nicotine dependence: Secondary | ICD-10-CM | POA: Diagnosis not present

## 2019-04-11 DIAGNOSIS — K222 Esophageal obstruction: Secondary | ICD-10-CM | POA: Diagnosis not present

## 2019-04-11 DIAGNOSIS — K449 Diaphragmatic hernia without obstruction or gangrene: Secondary | ICD-10-CM | POA: Diagnosis not present

## 2019-04-11 DIAGNOSIS — Z885 Allergy status to narcotic agent status: Secondary | ICD-10-CM | POA: Insufficient documentation

## 2019-04-11 DIAGNOSIS — Z7952 Long term (current) use of systemic steroids: Secondary | ICD-10-CM | POA: Diagnosis not present

## 2019-04-11 DIAGNOSIS — N289 Disorder of kidney and ureter, unspecified: Secondary | ICD-10-CM | POA: Diagnosis not present

## 2019-04-11 DIAGNOSIS — Z881 Allergy status to other antibiotic agents status: Secondary | ICD-10-CM | POA: Diagnosis not present

## 2019-04-11 DIAGNOSIS — Z7951 Long term (current) use of inhaled steroids: Secondary | ICD-10-CM | POA: Diagnosis not present

## 2019-04-11 DIAGNOSIS — Z888 Allergy status to other drugs, medicaments and biological substances status: Secondary | ICD-10-CM | POA: Diagnosis not present

## 2019-04-11 DIAGNOSIS — I110 Hypertensive heart disease with heart failure: Secondary | ICD-10-CM | POA: Diagnosis not present

## 2019-04-11 DIAGNOSIS — I509 Heart failure, unspecified: Secondary | ICD-10-CM | POA: Diagnosis not present

## 2019-04-11 DIAGNOSIS — I10 Essential (primary) hypertension: Secondary | ICD-10-CM | POA: Diagnosis not present

## 2019-04-11 DIAGNOSIS — Z880 Allergy status to penicillin: Secondary | ICD-10-CM | POA: Insufficient documentation

## 2019-04-11 DIAGNOSIS — Z7901 Long term (current) use of anticoagulants: Secondary | ICD-10-CM | POA: Diagnosis not present

## 2019-04-11 DIAGNOSIS — Z79899 Other long term (current) drug therapy: Secondary | ICD-10-CM | POA: Diagnosis not present

## 2019-04-11 DIAGNOSIS — Z79890 Hormone replacement therapy (postmenopausal): Secondary | ICD-10-CM | POA: Insufficient documentation

## 2019-04-11 DIAGNOSIS — E1142 Type 2 diabetes mellitus with diabetic polyneuropathy: Secondary | ICD-10-CM | POA: Diagnosis not present

## 2019-04-11 DIAGNOSIS — Z9981 Dependence on supplemental oxygen: Secondary | ICD-10-CM | POA: Insufficient documentation

## 2019-04-11 DIAGNOSIS — E039 Hypothyroidism, unspecified: Secondary | ICD-10-CM | POA: Diagnosis not present

## 2019-04-11 DIAGNOSIS — Z79811 Long term (current) use of aromatase inhibitors: Secondary | ICD-10-CM | POA: Insufficient documentation

## 2019-04-11 DIAGNOSIS — E119 Type 2 diabetes mellitus without complications: Secondary | ICD-10-CM | POA: Diagnosis not present

## 2019-04-11 HISTORY — PX: ESOPHAGOGASTRODUODENOSCOPY (EGD) WITH PROPOFOL: SHX5813

## 2019-04-11 SURGERY — ESOPHAGOGASTRODUODENOSCOPY (EGD) WITH PROPOFOL
Anesthesia: General

## 2019-04-11 MED ORDER — PROPOFOL 500 MG/50ML IV EMUL
INTRAVENOUS | Status: DC | PRN
  Administered 2019-04-11: 30 ug/kg/min via INTRAVENOUS

## 2019-04-11 MED ORDER — LIDOCAINE HCL (PF) 2 % IJ SOLN
INTRAMUSCULAR | Status: DC | PRN
  Administered 2019-04-11: 100 mg via INTRADERMAL

## 2019-04-11 MED ORDER — SODIUM CHLORIDE 0.9 % IV SOLN
INTRAVENOUS | Status: DC
  Administered 2019-04-11: 1000 mL via INTRAVENOUS

## 2019-04-11 MED ORDER — ONDANSETRON HCL 4 MG/2ML IJ SOLN
INTRAMUSCULAR | Status: AC
  Administered 2019-04-11: 4 mg
  Filled 2019-04-11: qty 2

## 2019-04-11 NOTE — Interval H&P Note (Signed)
History and Physical Interval Note:  04/11/2019 10:00 AM  Diana [name]  has presented today for surgery, with the diagnosis of dsyphagia R13.10.  The various methods of treatment have been discussed with the patient and family. After consideration of risks, benefits and other options for treatment, the patient has consented to  Procedure(s): ESOPHAGOGASTRODUODENOSCOPY (EGD) WITH PROPOFOL (N/A) as a surgical intervention.  The patient's history has been reviewed, patient examined, no change in status, stable for surgery.  I have reviewed the patient's chart and labs.  Questions were answered to the patient's satisfaction.     Hutson Luft [employer]

## 2019-04-11 NOTE — Anesthesia Postprocedure Evaluation (Signed)
Anesthesia Post Note  Patient: SHYLER HAMILL  Procedure(s) Performed: ESOPHAGOGASTRODUODENOSCOPY (EGD) WITH PROPOFOL (N/A )  Patient location during evaluation: Endoscopy Anesthesia Type: General Level of consciousness: awake and alert and oriented Pain management: pain level controlled Vital Signs Assessment: post-procedure vital signs reviewed and stable Respiratory status: spontaneous breathing Cardiovascular status: blood pressure returned to baseline Anesthetic complications: no     Last Vitals:  Vitals:   04/11/19 0917 04/11/19 1031  BP: 103/66 132/79  Pulse: 85   Resp: 16   Temp: 36.7 C 36.4 C  SpO2: 97%     Last Pain:  Vitals:   04/11/19 1041  TempSrc:   PainSc: 7                  Carmelia Tiner

## 2019-04-11 NOTE — Anesthesia Post-op Follow-up Note (Signed)
Anesthesia QCDR form completed.        

## 2019-04-11 NOTE — Interval H&P Note (Signed)
History and Physical Interval Note:  04/11/2019 9:14 AM  Diana [name]  has presented today for surgery, with the diagnosis of dsyphagia R13.10.  The various methods of treatment have been discussed with the patient and family. After consideration of risks, benefits and other options for treatment, the patient has consented to  Procedure(s): ESOPHAGOGASTRODUODENOSCOPY (EGD) WITH PROPOFOL (N/A) as a surgical intervention.  The patient's history has been reviewed, patient examined, no change in status, stable for surgery.  I have reviewed the patient's chart and labs.  Questions were answered to the patient's satisfaction.     Diana [name] [employer]

## 2019-04-11 NOTE — Anesthesia Procedure Notes (Signed)
Date/Time: 04/11/2019 10:17 AM Performed by: Nelda Marseille, CRNA Pre-anesthesia Checklist: Patient identified, Emergency Drugs available, Suction available, Patient being monitored and Timeout performed Oxygen Delivery Method: Nasal cannula

## 2019-04-11 NOTE — Op Note (Signed)
[facility] Patient Name: Diana [name] Procedure Date: 04/11/2019 10:02 AM MRN: [id number] Account #: [id number] Date of Birth: [birth year] Admit Type: Outpatient Age: 84 Room: [facility] ENDO ROOM 4 Gender: Female Note Status: Finalized Procedure:            Upper GI endoscopy Indications:          Dysphagia Providers:            Midge Minium MD, MD Referring MD:         Duncan Dull, MD (Referring MD) Medicines:            Propofol per Anesthesia Complications:        No immediate complications. Procedure:            Pre-Anesthesia Assessment:                       - Prior to the procedure, a History and Physical was                        performed, and patient medications and allergies were                        reviewed. The patient's tolerance of previous                        anesthesia was also reviewed. The risks and benefits of                        the procedure and the sedation options and risks were                        discussed with the patient. All questions were                        answered, and informed consent was obtained. Prior                        Anticoagulants: The patient has taken Coumadin                        (warfarin), last dose was 5 days prior to procedure.                        ASA Grade Assessment: III - A patient with severe                        systemic disease. After reviewing the risks and                        benefits, the patient was deemed in satisfactory                        condition to undergo the procedure.                       After obtaining informed consent, the endoscope was                        passed under direct vision. Throughout the procedure,  the patient's blood pressure, pulse, and oxygen                        saturations were monitored continuously. The Endoscope                        was introduced through the mouth, and advanced to the           second part of duodenum. The upper GI endoscopy was                        accomplished without difficulty. The patient tolerated                        the procedure well. Findings:      A large hiatal hernia was present.      One benign-appearing, intrinsic moderate stenosis was found at the       gastroesophageal junction. The stenosis was traversed. A TTS dilator was       passed through the scope. Dilation with a 15-16.5-18 mm balloon dilator       was performed to 18 mm. The dilation site was examined following       endoscope reinsertion and showed complete resolution of luminal       narrowing. Biopsies were taken with a cold forceps for histology.      The stomach was normal.      The examined duodenum was normal. Impression:           - Large hiatal hernia.                       - Benign-appearing esophageal stenosis. Dilated.                        Biopsied.                       - Normal stomach.                       - Normal examined duodenum. Recommendation:       - Discharge patient to home.                       - Resume previous diet.                       - Continue present medications.                       - Await pathology results.                       - Await pathology results. Procedure Code(s):    --- Professional ---                       [phone], Esophagogastroduodenoscopy, flexible, transoral;                        with transendoscopic balloon dilation of esophagus                        (less than 30 mm diameter)  43239, 59, Esophagogastroduodenoscopy, flexible,                        transoral; with biopsy, single or multiple Diagnosis Code(s):    --- Professional ---                       R13.10, Dysphagia, unspecified                       K22.2, Esophageal obstruction CPT copyright 2019 American Medical Association. All rights reserved. The codes documented in this report are preliminary and upon coder review may  be  revised to meet current compliance requirements. Midge Minium MD, MD 04/11/2019 10:26:00 AM This report has been signed electronically. Number of Addenda: 0 Note Initiated On: 04/11/2019 10:02 AM Estimated Blood Loss: Estimated blood loss: none.      [facility]

## 2019-04-11 NOTE — Transfer of Care (Signed)
Immediate Anesthesia Transfer of Care Note  Patient: Diana [name]  Procedure(s) Performed: ESOPHAGOGASTRODUODENOSCOPY (EGD) WITH PROPOFOL (N/A )  Patient Location: PACU  Anesthesia Type:General  Level of Consciousness: awake and sedated  Airway & Oxygen Therapy: Patient Spontanous Breathing and Patient connected to nasal cannula oxygen  Post-op Assessment: Report given to RN and Post -op Vital signs reviewed and stable  Post vital signs: Reviewed and stable  Last Vitals:  Vitals Value Taken Time  BP 132/79 04/11/19 1031  Temp    Pulse 90 04/11/19 1031  Resp 17 04/11/19 1031  SpO2 96 % 04/11/19 1031  Vitals shown include unvalidated device data.  Last Pain:  Vitals:   04/11/19 0917  TempSrc: Tympanic  PainSc: 7          Complications: No apparent anesthesia complications

## 2019-04-12 ENCOUNTER — Encounter: Payer: Self-pay | Admitting: Gastroenterology

## 2019-04-12 ENCOUNTER — Other Ambulatory Visit: Payer: Self-pay | Admitting: Internal Medicine

## 2019-04-12 ENCOUNTER — Telehealth: Payer: Self-pay | Admitting: Internal Medicine

## 2019-04-12 NOTE — Telephone Encounter (Signed)
Per Izora Gala with [facility], reports pt had a missed PT visit yesterday. (due to the procedure she had, pt did not feel up to visit)

## 2019-04-15 ENCOUNTER — Inpatient Hospital Stay: Payer: Medicare Other

## 2019-04-15 ENCOUNTER — Other Ambulatory Visit: Payer: Self-pay

## 2019-04-15 ENCOUNTER — Other Ambulatory Visit: Payer: Self-pay | Admitting: Internal Medicine

## 2019-04-15 DIAGNOSIS — D508 Other iron deficiency anemias: Secondary | ICD-10-CM

## 2019-04-15 DIAGNOSIS — N189 Chronic kidney disease, unspecified: Secondary | ICD-10-CM | POA: Diagnosis not present

## 2019-04-15 DIAGNOSIS — D631 Anemia in chronic kidney disease: Secondary | ICD-10-CM | POA: Diagnosis not present

## 2019-04-15 LAB — CBC WITH DIFFERENTIAL/PLATELET
Abs Immature Granulocytes: 0.01 10*3/uL (ref 0.00–0.07)
Basophils Absolute: 0.1 10*3/uL (ref 0.0–0.1)
Basophils Relative: 1 %
Eosinophils Absolute: 0.1 10*3/uL (ref 0.0–0.5)
Eosinophils Relative: 2 %
HCT: 34.1 % — ABNORMAL LOW (ref 36.0–46.0)
Hemoglobin: 10 g/dL — ABNORMAL LOW (ref 12.0–15.0)
Immature Granulocytes: 0 %
Lymphocytes Relative: 26 %
Lymphs Abs: 1.5 10*3/uL (ref 0.7–4.0)
MCH: 30.9 pg (ref 26.0–34.0)
MCHC: 29.3 g/dL — ABNORMAL LOW (ref 30.0–36.0)
MCV: 105.2 fL — ABNORMAL HIGH (ref 80.0–100.0)
Monocytes Absolute: 0.8 10*3/uL (ref 0.1–1.0)
Monocytes Relative: 14 %
Neutro Abs: 3.3 10*3/uL (ref 1.7–7.7)
Neutrophils Relative %: 57 %
Platelets: 183 10*3/uL (ref 150–400)
RBC: 3.24 MIL/uL — ABNORMAL LOW (ref 3.87–5.11)
RDW: 16.6 % — ABNORMAL HIGH (ref 11.5–15.5)
WBC: 5.8 10*3/uL (ref 4.0–10.5)
nRBC: 0 % (ref 0.0–0.2)

## 2019-04-15 LAB — SURGICAL PATHOLOGY

## 2019-04-16 ENCOUNTER — Telehealth: Payer: Self-pay

## 2019-04-16 NOTE — Telephone Encounter (Signed)
FYI

## 2019-04-16 NOTE — Telephone Encounter (Signed)
INR result from 04/10/2019 was 1.3. Pt was not taking coumadin at that time due to a procedure she had done on 04/11/2019. Pt will be checking INR again this week.

## 2019-04-18 ENCOUNTER — Telehealth: Payer: Self-pay

## 2019-04-18 ENCOUNTER — Encounter: Payer: Self-pay | Admitting: Internal Medicine

## 2019-04-18 DIAGNOSIS — Z7901 Long term (current) use of anticoagulants: Secondary | ICD-10-CM | POA: Diagnosis not present

## 2019-04-18 LAB — POCT INR

## 2019-04-18 NOTE — Telephone Encounter (Signed)
.   Your INR/coumadin level is therapeutic,  Continue current regimen and repeat PT/INR in one week

## 2019-04-18 NOTE — Telephone Encounter (Signed)
Pt's INR result from 04/18/2019 is 2.2. Pt is currently taking 2mg  daily.

## 2019-04-18 NOTE — Telephone Encounter (Signed)
Spoke with pt to let her know that her INR in therapeutic and that she needs to continue taking the 2mg  daily. Pt gave a verbal understanding.

## 2019-04-19 ENCOUNTER — Other Ambulatory Visit: Payer: Self-pay | Admitting: Internal Medicine

## 2019-04-19 ENCOUNTER — Other Ambulatory Visit: Payer: Self-pay

## 2019-04-19 DIAGNOSIS — Z86718 Personal history of other venous thrombosis and embolism: Secondary | ICD-10-CM

## 2019-04-19 DIAGNOSIS — I130 Hypertensive heart and chronic kidney disease with heart failure and stage 1 through stage 4 chronic kidney disease, or unspecified chronic kidney disease: Secondary | ICD-10-CM | POA: Diagnosis not present

## 2019-04-19 DIAGNOSIS — I270 Primary pulmonary hypertension: Secondary | ICD-10-CM | POA: Diagnosis not present

## 2019-04-19 DIAGNOSIS — Z79891 Long term (current) use of opiate analgesic: Secondary | ICD-10-CM

## 2019-04-19 DIAGNOSIS — I4891 Unspecified atrial fibrillation: Secondary | ICD-10-CM

## 2019-04-19 DIAGNOSIS — M810 Age-related osteoporosis without current pathological fracture: Secondary | ICD-10-CM

## 2019-04-19 DIAGNOSIS — E782 Mixed hyperlipidemia: Secondary | ICD-10-CM

## 2019-04-19 DIAGNOSIS — M419 Scoliosis, unspecified: Secondary | ICD-10-CM | POA: Diagnosis not present

## 2019-04-19 DIAGNOSIS — E1151 Type 2 diabetes mellitus with diabetic peripheral angiopathy without gangrene: Secondary | ICD-10-CM

## 2019-04-19 DIAGNOSIS — M4697 Unspecified inflammatory spondylopathy, lumbosacral region: Secondary | ICD-10-CM | POA: Diagnosis not present

## 2019-04-19 DIAGNOSIS — Z9981 Dependence on supplemental oxygen: Secondary | ICD-10-CM

## 2019-04-19 DIAGNOSIS — N189 Chronic kidney disease, unspecified: Secondary | ICD-10-CM | POA: Diagnosis not present

## 2019-04-19 DIAGNOSIS — E872 Acidosis: Secondary | ICD-10-CM | POA: Diagnosis not present

## 2019-04-19 DIAGNOSIS — G8929 Other chronic pain: Secondary | ICD-10-CM | POA: Diagnosis not present

## 2019-04-19 DIAGNOSIS — Z8701 Personal history of pneumonia (recurrent): Secondary | ICD-10-CM

## 2019-04-19 DIAGNOSIS — I5030 Unspecified diastolic (congestive) heart failure: Secondary | ICD-10-CM | POA: Diagnosis not present

## 2019-04-19 DIAGNOSIS — M3213 Lung involvement in systemic lupus erythematosus: Secondary | ICD-10-CM | POA: Diagnosis not present

## 2019-04-19 DIAGNOSIS — K210 Gastro-esophageal reflux disease with esophagitis: Secondary | ICD-10-CM

## 2019-04-19 DIAGNOSIS — G609 Hereditary and idiopathic neuropathy, unspecified: Secondary | ICD-10-CM | POA: Diagnosis not present

## 2019-04-19 DIAGNOSIS — E1122 Type 2 diabetes mellitus with diabetic chronic kidney disease: Secondary | ICD-10-CM | POA: Diagnosis not present

## 2019-04-19 DIAGNOSIS — D500 Iron deficiency anemia secondary to blood loss (chronic): Secondary | ICD-10-CM

## 2019-04-19 DIAGNOSIS — Z87891 Personal history of nicotine dependence: Secondary | ICD-10-CM

## 2019-04-19 DIAGNOSIS — Z7901 Long term (current) use of anticoagulants: Secondary | ICD-10-CM

## 2019-04-19 DIAGNOSIS — M5441 Lumbago with sciatica, right side: Secondary | ICD-10-CM | POA: Diagnosis not present

## 2019-04-19 DIAGNOSIS — Z853 Personal history of malignant neoplasm of breast: Secondary | ICD-10-CM

## 2019-04-23 ENCOUNTER — Ambulatory Visit (INDEPENDENT_AMBULATORY_CARE_PROVIDER_SITE_OTHER): Payer: Medicare Other | Admitting: Internal Medicine

## 2019-04-23 ENCOUNTER — Other Ambulatory Visit: Payer: Self-pay | Admitting: Internal Medicine

## 2019-04-23 ENCOUNTER — Telehealth: Payer: Self-pay

## 2019-04-23 ENCOUNTER — Other Ambulatory Visit: Payer: Self-pay

## 2019-04-23 ENCOUNTER — Encounter: Payer: Self-pay | Admitting: Internal Medicine

## 2019-04-23 VITALS — BP 100/58 | HR 99 | Temp 97.7°F | Resp 15 | Ht 60.0 in | Wt 106.0 lb

## 2019-04-23 DIAGNOSIS — I83029 Varicose veins of left lower extremity with ulcer of unspecified site: Secondary | ICD-10-CM

## 2019-04-23 DIAGNOSIS — L97929 Non-pressure chronic ulcer of unspecified part of left lower leg with unspecified severity: Secondary | ICD-10-CM | POA: Diagnosis not present

## 2019-04-23 DIAGNOSIS — Z5181 Encounter for therapeutic drug level monitoring: Secondary | ICD-10-CM | POA: Diagnosis not present

## 2019-04-23 DIAGNOSIS — B258 Other cytomegaloviral diseases: Secondary | ICD-10-CM | POA: Diagnosis not present

## 2019-04-23 DIAGNOSIS — K222 Esophageal obstruction: Secondary | ICD-10-CM

## 2019-04-23 DIAGNOSIS — E039 Hypothyroidism, unspecified: Secondary | ICD-10-CM | POA: Diagnosis not present

## 2019-04-23 DIAGNOSIS — I890 Lymphedema, not elsewhere classified: Secondary | ICD-10-CM

## 2019-04-23 DIAGNOSIS — Z7901 Long term (current) use of anticoagulants: Secondary | ICD-10-CM | POA: Diagnosis not present

## 2019-04-23 DIAGNOSIS — M5431 Sciatica, right side: Secondary | ICD-10-CM

## 2019-04-23 DIAGNOSIS — K208 Other esophagitis without bleeding: Secondary | ICD-10-CM

## 2019-04-23 MED ORDER — FENTANYL 62.5 MCG/HR TD PT72: 1.0000 | MEDICATED_PATCH | TRANSDERMAL | 0 refills | Status: DC

## 2019-04-23 MED ORDER — LEVOTHYROXINE SODIUM 112 MCG PO TABS: 112.0000 ug | ORAL_TABLET | Freq: Every day | ORAL | 0 refills | Status: AC

## 2019-04-23 MED ORDER — OXYCODONE-ACETAMINOPHEN 5-325 MG PO TABS: 1.0000 | ORAL_TABLET | Freq: Two times a day (BID) | ORAL | 0 refills | Status: AC | PRN

## 2019-04-23 NOTE — Telephone Encounter (Signed)
Pt notified of egd results and referral sent to ID in [place] per pt request.

## 2019-04-23 NOTE — Progress Notes (Signed)
Subjective:  Patient ID: Diana [name], female    DOB: 1935-01-25  Age: 84 y.o. MRN: [id number]  CC: The primary encounter diagnosis was Lymphedema of left leg. Diagnoses of Venous stasis ulcer of left lower extremity (HCC), Acquired hypothyroidism, Lymphedema of both lower extremities, Anticoagulation goal of INR 2 to 3, Sciatica of right side, and Esophagitis, CMV (cytomegalovirus) (HCC) were also pertinent to this visit.  HPI Diana [name] presents for follow up on chronic pain, recent EGD, Type 2 DM   EGD notes reviewed with patient.  Coumadin was suspended and INR was 1.3 on the day prior to procedure  Benign stricture at GE junction dilated.  Large HH previously seen . Biopsies noted CMV infection and she was advised to follow up with ID.   IDA: with macrocytosis,  Treated in ED on july 6 for edema.  Peripheral smear done,  Noted nucleated  RBCS and schistocytes , and reactive thrombocytosis managed with infusions.   hgb 10 last week  (up from 9.1 in early July at time f ER visit )  . Lab Results  Component Value Date   HGBA1C 5.8 (H) 02/04/2019   Current coumadin dose is 2 mg daily   She has chronic back pain and hip pain secondary to vertebral fractures,  Currently using fentanyl 50  Mcg  Patch and pain not controlled.  Using 2 percocet daily as well ,  Left hip.  Aggravated by prolonged bedridden state during recent hospitalization.  Has been significantly  Weaker  since discharge,  No longer able to perform independent ADLS  Cannot bend over to tie shoes ,  Cant stand without assistance .  Using a walker at home and can walk from one end of the house to the other is the farthest she can go,  PT is coming out anD she is starting to see an improvement  in strength .  Needs an aide for showers, dressings and neals.     [facility].    Chronic bilateral edema with open wounds on both legs   Stricture feels like it is returning  But it's likely the CMV   Reviewed June  admission for nausea and vomitong with diverticulitis  Severe LE edema  Did not respond to lasix that she took 20 mg for 3 days so she stopped taking it.   Appetite poor  Low T4 thyroid was overactive in May and she did not lower dose in may as directed . Was taking 137 mcg daily and in July  The dose was   Decreased dose to 125 mcg  And dose was still too high,  So it was lowered to 112 in late June.  However she had the 125 mcg dose refilled recently and has been taking it for st 2 weeks  Lab Results  Component Value Date   TSH 0.031 (L) 03/10/2019    Outpatient Medications Prior to Visit  Medication Sig Dispense Refill  . ALPRAZolam (XANAX) 0.25 MG tablet Take 1 tablet (0.25 mg total) by mouth 2 (two) times daily as needed for anxiety. 20 tablet 5  . Ascorbic Acid (VITAMIN C) 1000 MG tablet Take 1,000 mg by mouth daily.    . budesonide-formoterol (SYMBICORT) 160-4.5 MCG/ACT inhaler Symbicort 160 mcg-4.5 mcg/actuation HFA aerosol inhaler    . Calcium Carbonate-Vitamin D 600-400 MG-UNIT tablet Take 1 tablet by mouth daily.    . cyanocobalamin (,VITAMIN B-12,) 1000 MCG/ML injection Inject 1 mL (1,000 mcg total) into the muscle  every 30 (thirty) days. Pt uses on the 1st of every month. 10 mL 1  . furosemide (LASIX) 20 MG tablet TAKE 1 TABLET DAILY AS NEEDED FOR FLUID RETENTION 30 tablet 3  . hydroxychloroquine (PLAQUENIL) 200 MG tablet Take 200 mg by mouth daily.    Marland Kitchen letrozole (FEMARA) 2.5 MG tablet TAKE 1 TABLET BY MOUTH EVERY DAY 90 tablet 1  . losartan (COZAAR) 25 MG tablet TAKE 1 TABLET BY MOUTH EVERYDAY AT BEDTIME 30 tablet 2  . metoCLOPramide (REGLAN) 10 MG tablet TAKE 1 TABLET BY MOUTH 4 TIMES DAILY - BEFORE MEALS AND AT BEDTIME. 360 tablet 0  . metoprolol succinate (TOPROL-XL) 25 MG 24 hr tablet Take 12.5 mg by mouth daily.     . mupirocin ointment (BACTROBAN) 2 % Apply 1 application topically 2 (two) times daily. Apply thin layer to affected area on leg 22 g 0  . mycophenolate  (CELLCEPT) 500 MG tablet Take 1,000 mg by mouth 2 (two) times daily.    . ondansetron (ZOFRAN ODT) 4 MG disintegrating tablet Take 1 tablet (4 mg total) by mouth every 8 (eight) hours as needed for nausea or vomiting. 20 tablet 0  . OXYGEN Inhale 2 mLs into the lungs at bedtime.    . pantoprazole (PROTONIX) 40 MG tablet Take 40 mg by mouth 2 (two) times daily.    . Polyvinyl Alcohol-Povidone (REFRESH OP) Place 2 drops into both eyes daily as needed (dry eyes).     . predniSONE (DELTASONE) 5 MG tablet Take 5 mg by mouth daily.     . prochlorperazine (COMPAZINE) 5 MG tablet Take 1 tablet (5 mg total) by mouth every 6 (six) hours as needed for nausea or vomiting. 30 tablet 5  . Saccharomyces boulardii (PROBIOTIC) 250 MG CAPS Take 1 tablet by mouth daily. 14 capsule   . sucralfate (CARAFATE) 1 GM/10ML suspension Take 1 g by mouth 4 (four) times daily. Before meals and nightly    . Syringe/Needle, Disp, (SYRINGE 3CC/25GX1") 25G X 1" 3 ML MISC Use for b12 injections 50 each 0  . Tadalafil, PAH, (ADCIRCA) 20 MG TABS Take 40 mg by mouth daily.    Marland Kitchen warfarin (COUMADIN) 6 MG tablet TAKE 1 TABLET (6 MG TOTAL) BY MOUTH DAILY. AS DIRECTED. PATIENT NEEDS TODAY 90 tablet 0  . fentaNYL (DURAGESIC) 50 MCG/HR Place 1 patch onto the skin every 3 (three) days. 10 patch 0  . levothyroxine (SYNTHROID) 112 MCG tablet Take 1 tablet (112 mcg total) by mouth daily. 30 tablet 0  . oxyCODONE-acetaminophen (PERCOCET/ROXICET) 5-325 MG tablet Take 1 tablet by mouth 2 (two) times daily as needed for severe pain. 60 tablet 0  . warfarin (COUMADIN) 2 MG tablet Take 1 tablet (2 mg total) by mouth daily for 30 days. 30 tablet 0  . SYNTHROID 125 MCG tablet Take 125 mcg by mouth daily.     Facility-Administered Medications Prior to Visit  Medication Dose Route Frequency Provider Last Rate Last Dose  . Darbepoetin Alfa (ARANESP) injection 300 mcg  300 mcg Subcutaneous Weekly Creig Hines, MD   300 mcg at 02/25/19 1455    Review  of Systems;  Patient denies headache, fevers, malaise, unintentional weight loss, skin rash, eye pain, sinus congestion and sinus pain, sore throat, dysphagia,  hemoptysis , cough, dyspnea, wheezing, chest pain, palpitations, orthopnea, edema, abdominal pain, nausea, melena, diarrhea, constipation, flank pain, dysuria, hematuria, urinary  Frequency, nocturia, numbness, tingling, seizures,  Focal weakness, Loss of consciousness,  Tremor, insomnia, depression,  anxiety, and suicidal ideation.      Objective:  BP (!) 100/58 (BP Location: Left Arm, Patient Position: Sitting, Cuff Size: Normal)   Pulse 99   Temp 97.7 F (36.5 C) (Oral)   Resp 15   Ht 5' (1.524 m)   Wt 106 lb (48.1 kg)   SpO2 97%   BMI 20.70 kg/m   BP Readings from Last 3 Encounters:  04/23/19 (!) 100/58  04/15/19 (!) 93/57  04/11/19 132/79    Wt Readings from Last 3 Encounters:  04/23/19 106 lb (48.1 kg)  04/11/19 106 lb (48.1 kg)  04/01/19 106 lb 9.6 oz (48.4 kg)    General appearance: alert, cooperative and appears stated age Ears: normal TM's and external ear canals both ears Throat: lips, mucosa, and tongue normal; teeth and gums normal Neck: no adenopathy, no carotid bruit, supple, symmetrical, trachea midline and thyroid not enlarged, symmetric, no tenderness/mass/nodules Back: symmetric, no curvature. ROM normal. No CVA tenderness. Lungs: clear to auscultation bilaterally Heart: regular rate and rhythm, S1, S2 normal, no murmur, click, rub or gallop Abdomen: soft, non-tender; bowel sounds normal; no masses,  no organomegaly Pulses: 2+ and symmetric Skin: Skin color, texture, turgor normal. No rashes or lesions Lymph nodes: Cervical, supraclavicular, and axillary nodes normal.  Lab Results  Component Value Date   HGBA1C 5.8 (H) 02/04/2019   HGBA1C 6.4 08/09/2018   HGBA1C 6.4 02/05/2018    Lab Results  Component Value Date   CREATININE 1.76 (H) 03/25/2019   CREATININE 1.61 (H) 03/18/2019    CREATININE 1.21 (H) 03/13/2019    Lab Results  Component Value Date   WBC 5.8 04/15/2019   HGB 10.0 (L) 04/15/2019   HCT 34.1 (L) 04/15/2019   PLT 183 04/15/2019   GLUCOSE 125 (H) 03/25/2019   CHOL 164 10/11/2013   TRIG 152.0 (H) 10/11/2013   HDL 69.70 10/11/2013   LDLDIRECT 50.0 05/09/2016   LDLCALC 64 10/11/2013   ALT 13 03/10/2019   AST 26 03/10/2019   NA 141 03/25/2019   K 4.9 03/25/2019   CL 113 (H) 03/25/2019   CREATININE 1.76 (H) 03/25/2019   BUN 32 (H) 03/25/2019   CO2 20 (L) 03/25/2019   TSH 0.031 (L) 03/10/2019   INR 1.3 (A) 04/10/2019   HGBA1C 5.8 (H) 02/04/2019   MICROALBUR 8.7 (H) 09/14/2016    No results found.  Assessment & Plan:   Problem List Items Addressed This Visit      Unprioritized   Hypothyroidism    Prolonged incorrect dosing due to pharmacy error  Dose correcetd and  pharmacy notified of 112 mcg       Relevant Medications   levothyroxine (SYNTHROID) 112 MCG tablet   Other Relevant Orders   TSH   Sciatica    She is at high risk for complications from steroid injections and surgery.  Continue chronic pain management  With increase in dose of fentanyl transdermal to 75 mcg q 72 hours .  Authorized to supplement in afternoon with 1/2 tablet oxycodone       Esophagitis, CMV (cytomegalovirus) (HCC)    Recurrent , found during recent EGD .  Referral to ID       Anticoagulation goal of INR 2 to 3    Current  Dose is supra therapeutic at 3.8 ,  She will suspend coumadin for two nights and repeat INR on Friday    Lab Results  Component Value Date   INR 1.3 (A) 04/10/2019   INR 3.6 (A)  04/03/2019   INR 2.5 (H) 03/26/2019         Lymphedema of both lower extremities    With open wounds..  Referral to wound care  For compression wraps and wound care       Other Visit Diagnoses    Lymphedema of left leg    -  Primary   Relevant Orders   Ambulatory referral to Pain Clinic   Venous stasis ulcer of left lower extremity (HCC)        Relevant Orders   Ambulatory referral to Pain Clinic      I have discontinued Diana [name]'s fentaNYL and Synthroid. I have also changed her levothyroxine. Additionally, I am having her start on fentaNYL. Lastly, I am having her maintain her hydroxychloroquine, tadalafil (PAH), predniSONE, metoprolol succinate, mycophenolate, Polyvinyl Alcohol-Povidone (REFRESH OP), OXYGEN, Calcium Carbonate-Vitamin D, pantoprazole, cyanocobalamin, SYRINGE 3CC/25GX1", vitamin C, sucralfate, ondansetron, Probiotic, metoCLOPramide, ALPRAZolam, prochlorperazine, letrozole, budesonide-formoterol, mupirocin ointment, warfarin, warfarin, furosemide, losartan, and oxyCODONE-acetaminophen.  Meds ordered this encounter  Medications  . levothyroxine (SYNTHROID) 112 MCG tablet    Sig: Take 1 tablet (112 mcg total) by mouth daily. NAME BRAND ONLY SYNTHROID PLEASE  NOTE CURRENT DOSE    Dispense:  90 tablet    Refill:  0  . fentaNYL 62.5 MCG/HR PT72    Sig: Place 1 patch onto the skin every 3 (three) days.    Dispense:  10 patch    Refill:  0  . oxyCODONE-acetaminophen (PERCOCET/ROXICET) 5-325 MG tablet    Sig: Take 1 tablet by mouth 2 (two) times daily as needed for severe pain.    Dispense:  60 tablet    Refill:  0    Medications Discontinued During This Encounter  Medication Reason  . SYNTHROID 125 MCG tablet   . levothyroxine (SYNTHROID) 112 MCG tablet   . fentaNYL (DURAGESIC) 50 MCG/HR   . oxyCODONE-acetaminophen (PERCOCET/ROXICET) 5-325 MG tablet Reorder    Follow-up: No follow-ups on file.   Sherlene Shams, MD

## 2019-04-23 NOTE — Telephone Encounter (Signed)
-----   Message from [name], MD sent at 04/22/2019  1:56 PM EDT ----- Let the patient know that her biopsies showed an infection in her esophagus called CMV. SHe has had this before and should be seen by ID for further workup and treatment.

## 2019-04-23 NOTE — Telephone Encounter (Signed)
Called and spoke to [place] at [facility] gave verbal orders for nursing 2w 4 as requested.

## 2019-04-23 NOTE — Patient Instructions (Signed)
I have made a referral to home health for an aide,  And to wound care to manage the swelling in your legs   Your thyroid dose should  be 112 mg daily   We will recheck your thyroid in late September

## 2019-04-23 NOTE — Telephone Encounter (Signed)
Copied from [place] [phone]. Topic: General - Other >> Apr 22, 2019  4:45 PM Rainey Pines A wrote: Rn with [facility] needs approval for nursing  2w4 and best contact number [phone]

## 2019-04-24 ENCOUNTER — Encounter: Payer: Self-pay | Admitting: Internal Medicine

## 2019-04-24 ENCOUNTER — Telehealth: Payer: Self-pay

## 2019-04-24 DIAGNOSIS — I890 Lymphedema, not elsewhere classified: Secondary | ICD-10-CM | POA: Insufficient documentation

## 2019-04-24 LAB — POCT INR: INR: 3.8 — AB (ref 2.0–3.0)

## 2019-04-24 NOTE — Telephone Encounter (Signed)
Pt's INR for 85/2020 is 3.8. Pt is currently taking 2mg  daily.

## 2019-04-24 NOTE — Telephone Encounter (Signed)
Suspend the coumadin  for 2 days and recheck on Friday morning

## 2019-04-24 NOTE — Assessment & Plan Note (Signed)
She is at high risk for complications from steroid injections and surgery.  Continue chronic pain management  With increase in dose of fentanyl transdermal to 75 mcg q 72 hours .  Authorized to supplement in afternoon with 1/2 tablet oxycodone

## 2019-04-24 NOTE — Assessment & Plan Note (Signed)
Prolonged incorrect dosing due to pharmacy error  Dose correcetd and  pharmacy notified of 112 mcg

## 2019-04-24 NOTE — Telephone Encounter (Signed)
Spoke with pt and informed her that she needs to suspend her coumadin for tonight and tomorrow and recheck her INR again Friday morning. Pt gave a verbal understanding.

## 2019-04-24 NOTE — Assessment & Plan Note (Signed)
Current  Dose is supra therapeutic at 3.8 ,  She will suspend coumadin for two nights and repeat INR on Friday    Lab Results  Component Value Date   INR 1.3 (A) 04/10/2019   INR 3.6 (A) 04/03/2019   INR 2.5 (H) 03/26/2019

## 2019-04-24 NOTE — Assessment & Plan Note (Signed)
Recurrent , found during recent EGD .  Referral to ID

## 2019-04-24 NOTE — Assessment & Plan Note (Signed)
With open wounds..  Referral to wound care  For compression wraps and wound care

## 2019-04-24 NOTE — Assessment & Plan Note (Signed)
Managed with diet since diagnosis several years ago..  She is taking an ACE inhibitor  For management of microalbuminuria .  Has SLE nephropathy as well .  Aspirin C/I due to use of warfarin.  LDL was < 70  In 2015 , and repeat screening for hyperlipidemia has not been done in several years due to serious comorbidities   Lab Results  Component Value Date   HGBA1C 5.8 (H) 02/04/2019   Lab Results  Component Value Date   MICROALBUR 8.7 (H) 09/14/2016      Lab Results  Component Value Date   CHOL 164 10/11/2013   HDL 69.70 10/11/2013   LDLCALC 64 10/11/2013   LDLDIRECT 50.0 05/09/2016   TRIG 152.0 (H) 10/11/2013   CHOLHDL 2 10/11/2013

## 2019-04-26 ENCOUNTER — Telehealth: Payer: Self-pay

## 2019-04-26 ENCOUNTER — Other Ambulatory Visit: Payer: Self-pay

## 2019-04-26 ENCOUNTER — Encounter: Payer: Self-pay | Admitting: Internal Medicine

## 2019-04-26 LAB — POCT INR: INR: 2.8 (ref 2.0–3.0)

## 2019-04-26 MED ORDER — WARFARIN SODIUM 2 MG PO TABS: 2.0000 mg | ORAL_TABLET | Freq: Every day | ORAL | 1 refills | Status: DC

## 2019-04-26 NOTE — Telephone Encounter (Signed)
INR result from 04/26/2019 is 2.8. Pt currently takes 2mg  daily but has suspended coumadin for the last two nights.

## 2019-04-26 NOTE — Telephone Encounter (Signed)
Spoke with pt and informed her not to take her coumadin again tonight but then to resume it on Saturday at 2mg  daily. Also told pt to check INR again next Thursday. Pt gave a verbal understanding.

## 2019-04-26 NOTE — Telephone Encounter (Signed)
Resume 2 mg daily starting Saturday .  Repeat INR on Thursday of next week

## 2019-04-29 ENCOUNTER — Inpatient Hospital Stay: Payer: Medicare Other

## 2019-04-29 ENCOUNTER — Other Ambulatory Visit: Payer: Self-pay

## 2019-04-29 ENCOUNTER — Inpatient Hospital Stay: Payer: Medicare Other | Attending: Oncology

## 2019-04-29 ENCOUNTER — Encounter: Payer: Medicare Other | Attending: Physician Assistant | Admitting: Physician Assistant

## 2019-04-29 VITALS — BP 99/63

## 2019-04-29 DIAGNOSIS — X58XXXA Exposure to other specified factors, initial encounter: Secondary | ICD-10-CM | POA: Insufficient documentation

## 2019-04-29 DIAGNOSIS — N189 Chronic kidney disease, unspecified: Secondary | ICD-10-CM | POA: Diagnosis not present

## 2019-04-29 DIAGNOSIS — S8981XA Other specified injuries of right lower leg, initial encounter: Secondary | ICD-10-CM | POA: Diagnosis not present

## 2019-04-29 DIAGNOSIS — L97822 Non-pressure chronic ulcer of other part of left lower leg with fat layer exposed: Secondary | ICD-10-CM | POA: Insufficient documentation

## 2019-04-29 DIAGNOSIS — I5042 Chronic combined systolic (congestive) and diastolic (congestive) heart failure: Secondary | ICD-10-CM | POA: Insufficient documentation

## 2019-04-29 DIAGNOSIS — Z79811 Long term (current) use of aromatase inhibitors: Secondary | ICD-10-CM | POA: Insufficient documentation

## 2019-04-29 DIAGNOSIS — Z87891 Personal history of nicotine dependence: Secondary | ICD-10-CM | POA: Diagnosis not present

## 2019-04-29 DIAGNOSIS — E1142 Type 2 diabetes mellitus with diabetic polyneuropathy: Secondary | ICD-10-CM | POA: Diagnosis not present

## 2019-04-29 DIAGNOSIS — D509 Iron deficiency anemia, unspecified: Secondary | ICD-10-CM | POA: Diagnosis not present

## 2019-04-29 DIAGNOSIS — Z7901 Long term (current) use of anticoagulants: Secondary | ICD-10-CM | POA: Insufficient documentation

## 2019-04-29 DIAGNOSIS — Z882 Allergy status to sulfonamides status: Secondary | ICD-10-CM | POA: Insufficient documentation

## 2019-04-29 DIAGNOSIS — Z6820 Body mass index (BMI) 20.0-20.9, adult: Secondary | ICD-10-CM | POA: Diagnosis not present

## 2019-04-29 DIAGNOSIS — L97812 Non-pressure chronic ulcer of other part of right lower leg with fat layer exposed: Secondary | ICD-10-CM | POA: Insufficient documentation

## 2019-04-29 DIAGNOSIS — Z923 Personal history of irradiation: Secondary | ICD-10-CM | POA: Insufficient documentation

## 2019-04-29 DIAGNOSIS — Z853 Personal history of malignant neoplasm of breast: Secondary | ICD-10-CM | POA: Insufficient documentation

## 2019-04-29 DIAGNOSIS — E039 Hypothyroidism, unspecified: Secondary | ICD-10-CM | POA: Insufficient documentation

## 2019-04-29 DIAGNOSIS — N186 End stage renal disease: Secondary | ICD-10-CM | POA: Diagnosis not present

## 2019-04-29 DIAGNOSIS — E1122 Type 2 diabetes mellitus with diabetic chronic kidney disease: Secondary | ICD-10-CM | POA: Insufficient documentation

## 2019-04-29 DIAGNOSIS — D508 Other iron deficiency anemias: Secondary | ICD-10-CM

## 2019-04-29 DIAGNOSIS — Z170 Estrogen receptor positive status [ER+]: Secondary | ICD-10-CM | POA: Diagnosis not present

## 2019-04-29 DIAGNOSIS — D631 Anemia in chronic kidney disease: Secondary | ICD-10-CM | POA: Diagnosis not present

## 2019-04-29 DIAGNOSIS — Z8249 Family history of ischemic heart disease and other diseases of the circulatory system: Secondary | ICD-10-CM | POA: Insufficient documentation

## 2019-04-29 DIAGNOSIS — I890 Lymphedema, not elsewhere classified: Secondary | ICD-10-CM | POA: Diagnosis not present

## 2019-04-29 DIAGNOSIS — I129 Hypertensive chronic kidney disease with stage 1 through stage 4 chronic kidney disease, or unspecified chronic kidney disease: Secondary | ICD-10-CM | POA: Diagnosis not present

## 2019-04-29 DIAGNOSIS — M3214 Glomerular disease in systemic lupus erythematosus: Secondary | ICD-10-CM | POA: Diagnosis not present

## 2019-04-29 DIAGNOSIS — I132 Hypertensive heart and chronic kidney disease with heart failure and with stage 5 chronic kidney disease, or end stage renal disease: Secondary | ICD-10-CM | POA: Diagnosis not present

## 2019-04-29 DIAGNOSIS — R262 Difficulty in walking, not elsewhere classified: Secondary | ICD-10-CM | POA: Diagnosis not present

## 2019-04-29 DIAGNOSIS — M25559 Pain in unspecified hip: Secondary | ICD-10-CM | POA: Insufficient documentation

## 2019-04-29 DIAGNOSIS — Z79899 Other long term (current) drug therapy: Secondary | ICD-10-CM | POA: Diagnosis not present

## 2019-04-29 LAB — CBC WITH DIFFERENTIAL/PLATELET
Abs Immature Granulocytes: 0.04 10*3/uL (ref 0.00–0.07)
Basophils Absolute: 0 10*3/uL (ref 0.0–0.1)
Basophils Relative: 0 %
Eosinophils Absolute: 0 10*3/uL (ref 0.0–0.5)
Eosinophils Relative: 0 %
HCT: 32.1 % — ABNORMAL LOW (ref 36.0–46.0)
Hemoglobin: 9.6 g/dL — ABNORMAL LOW (ref 12.0–15.0)
Immature Granulocytes: 1 %
Lymphocytes Relative: 28 %
Lymphs Abs: 1.9 10*3/uL (ref 0.7–4.0)
MCH: 31.2 pg (ref 26.0–34.0)
MCHC: 29.9 g/dL — ABNORMAL LOW (ref 30.0–36.0)
MCV: 104.2 fL — ABNORMAL HIGH (ref 80.0–100.0)
Monocytes Absolute: 0.9 10*3/uL (ref 0.1–1.0)
Monocytes Relative: 13 %
Neutro Abs: 4 10*3/uL (ref 1.7–7.7)
Neutrophils Relative %: 58 %
Platelets: 142 10*3/uL — ABNORMAL LOW (ref 150–400)
RBC: 3.08 MIL/uL — ABNORMAL LOW (ref 3.87–5.11)
RDW: 17.1 % — ABNORMAL HIGH (ref 11.5–15.5)
WBC: 6.9 10*3/uL (ref 4.0–10.5)
nRBC: 0 % (ref 0.0–0.2)

## 2019-04-29 MED ORDER — DARBEPOETIN ALFA 300 MCG/0.6ML IJ SOSY
300.0000 ug | PREFILLED_SYRINGE | INTRAMUSCULAR | Status: DC
  Administered 2019-04-29: 16:00:00 300 ug via SUBCUTANEOUS
  Filled 2019-04-29: qty 0.6

## 2019-04-29 NOTE — Progress Notes (Signed)
GERMAIN, BENYO ([id number]) Visit Report for 04/29/2019 Abuse/Suicide Risk Screen Details Patient Name: Messing, Sofie B. Date of Service: 04/29/2019 1:15 PM Medical Record Number: [id number] Patient Account Number: [id number] Date of Birth/Sex: 03-25-1935 (84 y.o. F) Treating RN: Huel Coventry Primary Care Renessa Wellnitz: Duncan Dull Other Clinician: Referring Keshana Klemz: Duncan Dull Treating Amyrah Pinkhasov/Extender: Linwood Dibbles, HOYT Weeks in Treatment: 0 Abuse/Suicide Risk Screen Items Answer ABUSE RISK SCREEN: Has anyone close to you tried to hurt or harm you recentlyo No Do you feel uncomfortable with anyone in your familyo No Has anyone forced you do things that you didnot want to doo No Electronic Signature(s) Signed: 04/29/2019 5:04:06 PM By: Elliot Gurney, BSN, RN, CWS, Kim RN, BSN Entered By: Elliot Gurney, BSN, RN, CWS, Kim on 04/29/2019 13:29:17 Marschner, Abigail Miyamoto ([id number]) -------------------------------------------------------------------------------- Activities of Daily Living Details Patient Name: Eisenhower, Kishia B. Date of Service: 04/29/2019 1:15 PM Medical Record Number: [id number] Patient Account Number: [id number] Date of Birth/Sex: May 22, 1935 (84 y.o. F) Treating RN: Huel Coventry Primary Care Aiya Keach: Duncan Dull Other Clinician: Referring Kateryn Marasigan: Duncan Dull Treating Keoki Mchargue/Extender: Linwood Dibbles, HOYT Weeks in Treatment: 0 Activities of Daily Living Items Answer Activities of Daily Living (Please select one for each item) Drive Automobile Not Able Take Medications Completely Able Use Telephone Completely Able Care for Appearance Completely Able Use Toilet Completely Able Bath / Shower Completely Able Dress Self Completely Able Feed Self Completely Able Walk Need Assistance Get In / Out Bed Completely Able Housework Need Assistance Prepare Meals Need Assistance Handle Money Completely Able Shop for Self Need Assistance Electronic Signature(s) Signed: 04/29/2019 5:04:06 PM  By: Elliot Gurney, BSN, RN, CWS, Kim RN, BSN Entered By: Elliot Gurney, BSN, RN, CWS, Kim on 04/29/2019 13:30:13 Avellino, Abigail Miyamoto ([id number]) -------------------------------------------------------------------------------- Education Screening Details Patient Name: Ernsberger, Ricky B. Date of Service: 04/29/2019 1:15 PM Medical Record Number: [id number] Patient Account Number: [id number] Date of Birth/Sex: 05-13-1935 (84 y.o. F) Treating RN: Huel Coventry Primary Care Asiah Browder: Duncan Dull Other Clinician: Referring Jacquise Rarick: Duncan Dull Treating Jaunita Mikels/Extender: Linwood Dibbles, HOYT Weeks in Treatment: 0 Primary Learner Assessed: Patient Learning Preferences/Education Level/Primary Language Learning Preference: Explanation, Demonstration Highest Education Level: College or Above Preferred Language: English Cognitive Barrier Language Barrier: No Translator Needed: No Memory Deficit: No Emotional Barrier: No Cultural/Religious Beliefs Affecting Medical Care: No Physical Barrier Impaired Vision: Yes Glasses Impaired Hearing: No Decreased Hand dexterity: No Knowledge/Comprehension Knowledge Level: High Comprehension Level: High Ability to understand written High instructions: Ability to understand verbal High instructions: Motivation Anxiety Level: Calm Cooperation: Cooperative Education Importance: Acknowledges Need Interest in Health Problems: Asks Questions Perception: Coherent Willingness to Engage in Self- High Management Activities: Readiness to Engage in Self- High Management Activities: Electronic Signature(s) Signed: 04/29/2019 5:04:06 PM By: Elliot Gurney, BSN, RN, CWS, Kim RN, BSN Entered By: Elliot Gurney, BSN, RN, CWS, Kim on 04/29/2019 13:30:45 [name] ([id number]) -------------------------------------------------------------------------------- Fall Risk Assessment Details Patient Name: [name] B. Date of Service: 04/29/2019 1:15 PM Medical Record Number:  [id number] Patient Account Number: [id number] Date of Birth/Sex: 06-Apr-1935 (84 y.o. F) Treating RN: Huel Coventry Primary Care Matea Stanard: Duncan Dull Other Clinician: Referring Makaia Rappa: Duncan Dull Treating Leather Estis/Extender: Linwood Dibbles, HOYT Weeks in Treatment: 0 Fall Risk Assessment Items Have you had 2 or more falls in the last 12 monthso 0 No Have you had any fall that resulted in injury in the last 12 monthso 0 No FALLS RISK SCREEN History of falling - immediate or within 3 months 0 No Secondary diagnosis (Do you have 2 or more medical diagnoseso) 0 No  Ambulatory aid None/bed rest/wheelchair/nurse 0 No Crutches/cane/walker 15 Yes Furniture 0 No Intravenous therapy Access/Saline/Heparin Lock 0 No Gait/Transferring Normal/ bed rest/ wheelchair 0 No Weak (short steps with or without shuffle, stooped but able to lift head while 0 No walking, may seek support from furniture) Impaired (short steps with shuffle, may have difficulty arising from chair, head 20 Yes down, impaired balance) Mental Status Oriented to own ability 0 No Electronic Signature(s) Signed: 04/29/2019 5:04:06 PM By: Elliot Gurney, BSN, RN, CWS, Kim RN, BSN Entered By: Elliot Gurney, BSN, RN, CWS, Kim on 04/29/2019 13:31:14 Burleigh, Abigail Miyamoto ([id number]) -------------------------------------------------------------------------------- Nutrition Risk Screening Details Patient Name: Golz, Jolene B. Date of Service: 04/29/2019 1:15 PM Medical Record Number: [id number] Patient Account Number: [id number] Date of Birth/Sex: 03-03-1935 (84 y.o. F) Treating RN: Huel Coventry Primary Care Liyat Faulkenberry: Duncan Dull Other Clinician: Referring Jayleene Glaeser: Duncan Dull Treating Jolaine Fryberger/Extender: Linwood Dibbles, HOYT Weeks in Treatment: 0 Height (in): 60 Weight (lbs): 106 Body Mass Index (BMI): 20.7 Nutrition Risk Screening Items Score Screening NUTRITION RISK SCREEN: I have an illness or condition that made me change the kind and/or amount  of 0 No food I eat I eat fewer than two meals per day 0 No I eat few fruits and vegetables, or milk products 0 No I have three or more drinks of beer, liquor or wine almost every day 0 No I have tooth or mouth problems that make it hard for me to eat 0 No I don't always have enough money to buy the food I need 0 No I eat alone most of the time 0 No I take three or more different prescribed or over-the-counter drugs a day 1 Yes Without wanting to, I have lost or gained 10 pounds in the last six months 2 Yes I am not always physically able to shop, cook and/or feed myself 0 No Nutrition Protocols Good Risk Protocol Provide education on elevated blood sugars and Moderate Risk Protocol 0 impact on wound healing, as applicable High Risk Proctocol Risk Level: Moderate Risk Score: 3 Electronic Signature(s) Signed: 04/29/2019 5:04:06 PM By: Elliot Gurney, BSN, RN, CWS, Kim RN, BSN Entered By: Elliot Gurney, BSN, RN, CWS, Kim on 04/29/2019 13:31:57

## 2019-04-29 NOTE — Progress Notes (Signed)
Number: [id number] Date of Birth/Sex: Feb 12, 1935 (84 y.o. F) Treating RN: Cornell Barman Primary Care Chriss Mannan: Deborra Medina Other Clinician: Referring Elisa Sorlie: Deborra Medina Treating Malayah Demuro/Extender: Melburn Hake, HOYT Weeks in Treatment: 0 Wound Status Wound Number: 1  Primary Lymphedema Etiology: Wound Location: Left Lower Leg Secondary Diabetic Wound/Ulcer of the Lower Extremity Wounding Event: Gradually Appeared Etiology: Date Acquired: 03/18/2019 Wound Open Weeks Of Treatment: 0 Status: Clustered Wound: No Comorbid Cataracts, Chronic sinus problems/congestion, History: Anemia, Congestive Heart Failure, Type II Diabetes, End Stage Renal Disease, Lupus Erythematosus, Neuropathy, Received Radiation Photos Wound Measurements Length: (cm) 2.8 Width: (cm) 1.6 Depth: (cm) 0.1 Area: (cm) 3.519 Volume: (cm) 0.352 % Reduction in Area: 0% % Reduction in Volume: 0% Epithelialization: None Tunneling: No Undermining: No Wound Description Full Thickness Without Exposed Support Classification: Structures Wound Margin: Indistinct, nonvisible Exudate Medium Amount: Exudate Type: Serous Exudate Color: amber Foul Odor After Cleansing: No Slough/Fibrino No Wound Bed Granulation Amount: None Present (0%) Exposed Structure Necrotic Amount: Large (67-100%) Fascia Exposed: No Necrotic Quality: Eschar Fat Layer (Subcutaneous Tissue) Exposed: Yes Tendon Exposed: No Muscle Exposed: No [name], [name] ([id number]) Joint Exposed: No Bone Exposed: No Treatment Notes Wound #1 (Left Lower Leg) Notes prisma, ABD, conform, tubigrip Electronic Signature(s) Signed: 04/29/2019 5:04:06 PM By: Gretta Cool, BSN, RN, CWS, Kim RN, BSN Entered By: Gretta Cool, BSN, RN, CWS, Kim on 04/29/2019 13:35:49 [place], [name] ([id number]) -------------------------------------------------------------------------------- Wound Assessment Details Patient Name: Diana [name], [name] Date of Service: 04/29/2019 1:15 PM Medical Record Number: [id number] Patient Account Number: [id number] Date of Birth/Sex: 23-Jun-1935 (84 y.o. F) Treating RN: Cornell Barman Primary Care Karra Pink: Deborra Medina Other Clinician: Referring David Rodriquez: Deborra Medina Treating Jamian Andujo/Extender: Melburn Hake,  HOYT Weeks in Treatment: 0 Wound Status Wound Number: 2 Primary Trauma, Other Etiology: Wound Location: Right Lower Leg - Lateral Secondary Lymphedema Wounding Event: Gradually Appeared Etiology: Date Acquired: 04/26/2019 Wound Open Weeks Of Treatment: 0 Status: Clustered Wound: No Comorbid Cataracts, Chronic sinus problems/congestion, History: Anemia, Congestive Heart Failure, Type II Diabetes, End Stage Renal Disease, Lupus Erythematosus, Neuropathy, Received Radiation Photos Wound Measurements Length: (cm) 1.6 Width: (cm) 0.5 Depth: (cm) 0.3 Area: (cm) 0.628 Volume: (cm) 0.188 % Reduction in Area: % Reduction in Volume: Epithelialization: None Tunneling: No Undermining: No Wound Description Full Thickness Without Exposed Support Classification: Structures Wound Margin: Flat and Intact Exudate None Present Amount: Foul Odor After Cleansing: No Slough/Fibrino Yes Wound Bed Granulation Amount: Large (67-100%) Exposed Structure Granulation Quality: Pink Fascia Exposed: No Necrotic Amount: None Present (0%) Fat Layer (Subcutaneous Tissue) Exposed: Yes Tendon Exposed: No Muscle Exposed: No Joint Exposed: No Bone Exposed: No Schnider, Tabathia B. ([id number]) Treatment Notes Wound #2 (Right, Lateral Lower Leg) Notes prisma, ABD, conform, tubigrip Electronic Signature(s) Signed: 04/29/2019 5:04:06 PM By: Gretta Cool, BSN, RN, CWS, Kim RN, BSN Entered By: Gretta Cool, BSN, RN, CWS, Kim on 04/29/2019 13:39:46 Petree, [name] ([id number]) -------------------------------------------------------------------------------- Vitals Details Patient Name: Diana Bene B. Date of Service: 04/29/2019 1:15 PM Medical Record Number: [id number] Patient Account Number: [id number] Date of Birth/Sex: 03-Apr-1935 (84 y.o. F) Treating RN: Cornell Barman Primary Care Kemaya Dorner: Deborra Medina Other Clinician: Referring Lashawne Dura: Deborra Medina Treating Ivis Nicolson/Extender: Melburn Hake, HOYT Weeks in  Treatment: 0 Vital Signs Time Taken: 13:16 Temperature (F): 97.9 Height (in): 60 Pulse (bpm): 100 Weight (lbs): 106 Respiratory Rate (breaths/min): 16 Body Mass Index (BMI): 20.7 Blood Pressure (mmHg): 95/64 Reference Range: 80 - 120 mg / dl Electronic Signature(s) Signed: 04/29/2019 5:04:06 PM By: Gretta Cool, BSN, RN, CWS, Kim RN, BSN Entered By: Gretta Cool, BSN,  Number: [id number] Date of Birth/Sex: Feb 12, 1935 (84 y.o. F) Treating RN: Cornell Barman Primary Care Chriss Mannan: Deborra Medina Other Clinician: Referring Elisa Sorlie: Deborra Medina Treating Malayah Demuro/Extender: Melburn Hake, HOYT Weeks in Treatment: 0 Wound Status Wound Number: 1  Primary Lymphedema Etiology: Wound Location: Left Lower Leg Secondary Diabetic Wound/Ulcer of the Lower Extremity Wounding Event: Gradually Appeared Etiology: Date Acquired: 03/18/2019 Wound Open Weeks Of Treatment: 0 Status: Clustered Wound: No Comorbid Cataracts, Chronic sinus problems/congestion, History: Anemia, Congestive Heart Failure, Type II Diabetes, End Stage Renal Disease, Lupus Erythematosus, Neuropathy, Received Radiation Photos Wound Measurements Length: (cm) 2.8 Width: (cm) 1.6 Depth: (cm) 0.1 Area: (cm) 3.519 Volume: (cm) 0.352 % Reduction in Area: 0% % Reduction in Volume: 0% Epithelialization: None Tunneling: No Undermining: No Wound Description Full Thickness Without Exposed Support Classification: Structures Wound Margin: Indistinct, nonvisible Exudate Medium Amount: Exudate Type: Serous Exudate Color: amber Foul Odor After Cleansing: No Slough/Fibrino No Wound Bed Granulation Amount: None Present (0%) Exposed Structure Necrotic Amount: Large (67-100%) Fascia Exposed: No Necrotic Quality: Eschar Fat Layer (Subcutaneous Tissue) Exposed: Yes Tendon Exposed: No Muscle Exposed: No [name], [name] ([id number]) Joint Exposed: No Bone Exposed: No Treatment Notes Wound #1 (Left Lower Leg) Notes prisma, ABD, conform, tubigrip Electronic Signature(s) Signed: 04/29/2019 5:04:06 PM By: Gretta Cool, BSN, RN, CWS, Kim RN, BSN Entered By: Gretta Cool, BSN, RN, CWS, Kim on 04/29/2019 13:35:49 Vinton, [name] ([id number]) -------------------------------------------------------------------------------- Wound Assessment Details Patient Name: Diana [name], [name] Date of Service: 04/29/2019 1:15 PM Medical Record Number: [id number] Patient Account Number: [id number] Date of Birth/Sex: 23-Jun-1935 (84 y.o. F) Treating RN: Cornell Barman Primary Care Karra Pink: Deborra Medina Other Clinician: Referring David Rodriquez: Deborra Medina Treating Jamian Andujo/Extender: Melburn Hake,  HOYT Weeks in Treatment: 0 Wound Status Wound Number: 2 Primary Trauma, Other Etiology: Wound Location: Right Lower Leg - Lateral Secondary Lymphedema Wounding Event: Gradually Appeared Etiology: Date Acquired: 04/26/2019 Wound Open Weeks Of Treatment: 0 Status: Clustered Wound: No Comorbid Cataracts, Chronic sinus problems/congestion, History: Anemia, Congestive Heart Failure, Type II Diabetes, End Stage Renal Disease, Lupus Erythematosus, Neuropathy, Received Radiation Photos Wound Measurements Length: (cm) 1.6 Width: (cm) 0.5 Depth: (cm) 0.3 Area: (cm) 0.628 Volume: (cm) 0.188 % Reduction in Area: % Reduction in Volume: Epithelialization: None Tunneling: No Undermining: No Wound Description Full Thickness Without Exposed Support Classification: Structures Wound Margin: Flat and Intact Exudate None Present Amount: Foul Odor After Cleansing: No Slough/Fibrino Yes Wound Bed Granulation Amount: Large (67-100%) Exposed Structure Granulation Quality: Pink Fascia Exposed: No Necrotic Amount: None Present (0%) Fat Layer (Subcutaneous Tissue) Exposed: Yes Tendon Exposed: No Muscle Exposed: No Joint Exposed: No Bone Exposed: No Schnider, Tabathia B. ([id number]) Treatment Notes Wound #2 (Right, Lateral Lower Leg) Notes prisma, ABD, conform, tubigrip Electronic Signature(s) Signed: 04/29/2019 5:04:06 PM By: Gretta Cool, BSN, RN, CWS, Kim RN, BSN Entered By: Gretta Cool, BSN, RN, CWS, Kim on 04/29/2019 13:39:46 Petree, [name] ([id number]) -------------------------------------------------------------------------------- Vitals Details Patient Name: Diana Bene B. Date of Service: 04/29/2019 1:15 PM Medical Record Number: [id number] Patient Account Number: [id number] Date of Birth/Sex: 03-Apr-1935 (84 y.o. F) Treating RN: Cornell Barman Primary Care Kemaya Dorner: Deborra Medina Other Clinician: Referring Lashawne Dura: Deborra Medina Treating Ivis Nicolson/Extender: Melburn Hake, HOYT Weeks in  Treatment: 0 Vital Signs Time Taken: 13:16 Temperature (F): 97.9 Height (in): 60 Pulse (bpm): 100 Weight (lbs): 106 Respiratory Rate (breaths/min): 16 Body Mass Index (BMI): 20.7 Blood Pressure (mmHg): 95/64 Reference Range: 80 - 120 mg / dl Electronic Signature(s) Signed: 04/29/2019 5:04:06 PM By: Gretta Cool, BSN, RN, CWS, Kim RN, BSN Entered By: Gretta Cool, BSN,  Lower Extremity Comorbid History: Cataracts, Chronic sinus Cataracts, Chronic sinus N/A problems/congestion, Anemia, problems/congestion, Anemia, Congestive Heart Failure, Congestive Heart Failure, Type II Diabetes, End Stage Type II Diabetes, End Stage Renal Disease, Lupus Renal Disease, Lupus Erythematosus, Neuropathy, Erythematosus, Neuropathy, Received Radiation Received Radiation Date Acquired: 03/18/2019 04/26/2019 N/A Weeks of Treatment: 0 0 N/A Wound Status: Open Open N/A Measurements L x W x D 2.8x1.6x0.1 1.6x0.5x0.3 N/A (cm) Area (cm) : 3.519 0.628 N/A Volume (cm) : 0.352 0.188 N/A % Reduction in Area: 0.00% N/A N/A % Reduction in Volume: 0.00% N/A N/A Classification: Full Thickness Without Full Thickness Without N/A Exposed Support Structures Exposed Support Structures Exudate Amount: Medium None Present N/A Exudate Type: Serous N/A N/A Exudate Color: amber N/A N/A Wound Margin: Indistinct, nonvisible Flat and Intact N/A Granulation Amount: None Present (0%) Large (67-100%) N/A Granulation Quality: N/A Pink N/A Cathell, Gizella B. ([id number]) Necrotic Amount: Large (67-100%) None  Present (0%) N/A Necrotic Tissue: Eschar N/A N/A Exposed Structures: Fat Layer (Subcutaneous Fat Layer (Subcutaneous N/A Tissue) Exposed: Yes Tissue) Exposed: Yes Fascia: No Fascia: No Tendon: No Tendon: No Muscle: No Muscle: No Joint: No Joint: No Bone: No Bone: No Epithelialization: None None N/A Treatment Notes Electronic Signature(s) Signed: 04/29/2019 4:21:56 PM By: Harold Barban Entered By: Harold Barban on 04/29/2019 13:55:46 [place], Analaura Jacinto Reap ([id number]) -------------------------------------------------------------------------------- [place] Details Patient Name: Diana Bene B. Date of Service: 04/29/2019 1:15 PM Medical Record Number: [id number] Patient Account Number: [id number] Date of Birth/Sex: 1934-09-24 (84 y.o. F) Treating RN: Harold Barban Primary Care Jermond Burkemper: Deborra Medina Other Clinician: Referring Yuritzy Zehring: Deborra Medina Treating Millan Legan/Extender: Melburn Hake, HOYT Weeks in Treatment: 0 Active Inactive Wound/Skin Impairment Nursing Diagnoses: Impaired tissue integrity Goals: Ulcer/skin breakdown will have a volume reduction of 30% by week 4 Date Initiated: 04/29/2019 Target Resolution Date: 05/30/2019 Goal Status: Active Interventions: Assess patient/caregiver ability to obtain necessary supplies Assess patient/caregiver ability to perform ulcer/skin care regimen upon admission and as needed Assess ulceration(s) every visit Notes: Electronic Signature(s) Signed: 04/29/2019 4:21:56 PM By: Harold Barban Entered By: Harold Barban on 04/29/2019 13:55:30 Bergfeld, Brinlynn B. ([id number]) -------------------------------------------------------------------------------- Pain Assessment Details Patient Name: Oleksy, Jenny B. Date of Service: 04/29/2019 1:15 PM Medical Record Number: [id number] Patient Account Number: [id number] Date of Birth/Sex: [birth year] (84 y.o. F) Treating RN: Cornell Barman Primary Care Caasi Giglia: Deborra Medina Other Clinician: Referring Page Pucciarelli: Deborra Medina Treating Bevin Mayall/Extender: Melburn Hake, HOYT Weeks in Treatment: 0 Active Problems Location of Pain Severity and Description of Pain Patient Has Paino Yes Site Locations Pain Location: Generalized Pain Pain Management and Medication Current Pain Management: Goals for Pain Management Patient states she has pain in her hip. Electronic Signature(s) Signed: 04/29/2019 5:04:06 PM By: Gretta Cool, BSN, RN, CWS, Kim RN, BSN Entered By: Gretta Cool, BSN, RN, CWS, Kim on 04/29/2019 13:16:36 [place], [name] ([id number]) -------------------------------------------------------------------------------- Patient/Caregiver Education Details Patient Name: Diana Bene B. Date of Service: 04/29/2019 1:15 PM Medical Record Number: [id number] Patient Account Number: [id number] Date of Birth/Gender: September 25, 1934 (84 y.o. F) Treating RN: Harold Barban Primary Care Physician: Deborra Medina Other Clinician: Referring Physician: Deborra Medina Treating Physician/Extender: Melburn Hake, HOYT Weeks in Treatment: 0 Education Assessment Education Provided To: Patient Education Topics Provided Wound/Skin Impairment: Handouts: Caring for Your Ulcer Methods: Demonstration, Explain/Verbal Responses: State content correctly Electronic Signature(s) Signed: 04/29/2019 4:21:56 PM By: Harold Barban Entered By: Harold Barban on 04/29/2019 13:56:46 Gigante, Soumya B. ([id number]) -------------------------------------------------------------------------------- Wound Assessment Details Patient Name: Delagarza, Sidra B. Date of Service: 04/29/2019 1:15 PM Medical Record Number: [id number] Patient Account  Number: [id number] Date of Birth/Sex: Feb 12, 1935 (84 y.o. F) Treating RN: Cornell Barman Primary Care Chriss Mannan: Deborra Medina Other Clinician: Referring Elisa Sorlie: Deborra Medina Treating Malayah Demuro/Extender: Melburn Hake, HOYT Weeks in Treatment: 0 Wound Status Wound Number: 1  Primary Lymphedema Etiology: Wound Location: Left Lower Leg Secondary Diabetic Wound/Ulcer of the Lower Extremity Wounding Event: Gradually Appeared Etiology: Date Acquired: 03/18/2019 Wound Open Weeks Of Treatment: 0 Status: Clustered Wound: No Comorbid Cataracts, Chronic sinus problems/congestion, History: Anemia, Congestive Heart Failure, Type II Diabetes, End Stage Renal Disease, Lupus Erythematosus, Neuropathy, Received Radiation Photos Wound Measurements Length: (cm) 2.8 Width: (cm) 1.6 Depth: (cm) 0.1 Area: (cm) 3.519 Volume: (cm) 0.352 % Reduction in Area: 0% % Reduction in Volume: 0% Epithelialization: None Tunneling: No Undermining: No Wound Description Full Thickness Without Exposed Support Classification: Structures Wound Margin: Indistinct, nonvisible Exudate Medium Amount: Exudate Type: Serous Exudate Color: amber Foul Odor After Cleansing: No Slough/Fibrino No Wound Bed Granulation Amount: None Present (0%) Exposed Structure Necrotic Amount: Large (67-100%) Fascia Exposed: No Necrotic Quality: Eschar Fat Layer (Subcutaneous Tissue) Exposed: Yes Tendon Exposed: No Muscle Exposed: No [name], [name] ([id number]) Joint Exposed: No Bone Exposed: No Treatment Notes Wound #1 (Left Lower Leg) Notes prisma, ABD, conform, tubigrip Electronic Signature(s) Signed: 04/29/2019 5:04:06 PM By: Gretta Cool, BSN, RN, CWS, Kim RN, BSN Entered By: Gretta Cool, BSN, RN, CWS, Kim on 04/29/2019 13:35:49 [place], [name] ([id number]) -------------------------------------------------------------------------------- Wound Assessment Details Patient Name: Diana [name], [name] Date of Service: 04/29/2019 1:15 PM Medical Record Number: [id number] Patient Account Number: [id number] Date of Birth/Sex: 23-Jun-1935 (84 y.o. F) Treating RN: Cornell Barman Primary Care Karra Pink: Deborra Medina Other Clinician: Referring David Rodriquez: Deborra Medina Treating Jamian Andujo/Extender: Melburn Hake,  HOYT Weeks in Treatment: 0 Wound Status Wound Number: 2 Primary Trauma, Other Etiology: Wound Location: Right Lower Leg - Lateral Secondary Lymphedema Wounding Event: Gradually Appeared Etiology: Date Acquired: 04/26/2019 Wound Open Weeks Of Treatment: 0 Status: Clustered Wound: No Comorbid Cataracts, Chronic sinus problems/congestion, History: Anemia, Congestive Heart Failure, Type II Diabetes, End Stage Renal Disease, Lupus Erythematosus, Neuropathy, Received Radiation Photos Wound Measurements Length: (cm) 1.6 Width: (cm) 0.5 Depth: (cm) 0.3 Area: (cm) 0.628 Volume: (cm) 0.188 % Reduction in Area: % Reduction in Volume: Epithelialization: None Tunneling: No Undermining: No Wound Description Full Thickness Without Exposed Support Classification: Structures Wound Margin: Flat and Intact Exudate None Present Amount: Foul Odor After Cleansing: No Slough/Fibrino Yes Wound Bed Granulation Amount: Large (67-100%) Exposed Structure Granulation Quality: Pink Fascia Exposed: No Necrotic Amount: None Present (0%) Fat Layer (Subcutaneous Tissue) Exposed: Yes Tendon Exposed: No Muscle Exposed: No Joint Exposed: No Bone Exposed: No Schnider, Tabathia B. ([id number]) Treatment Notes Wound #2 (Right, Lateral Lower Leg) Notes prisma, ABD, conform, tubigrip Electronic Signature(s) Signed: 04/29/2019 5:04:06 PM By: Gretta Cool, BSN, RN, CWS, Kim RN, BSN Entered By: Gretta Cool, BSN, RN, CWS, Kim on 04/29/2019 13:39:46 Petree, [name] ([id number]) -------------------------------------------------------------------------------- Vitals Details Patient Name: Diana Bene B. Date of Service: 04/29/2019 1:15 PM Medical Record Number: [id number] Patient Account Number: [id number] Date of Birth/Sex: 03-Apr-1935 (84 y.o. F) Treating RN: Cornell Barman Primary Care Kemaya Dorner: Deborra Medina Other Clinician: Referring Lashawne Dura: Deborra Medina Treating Ivis Nicolson/Extender: Melburn Hake, HOYT Weeks in  Treatment: 0 Vital Signs Time Taken: 13:16 Temperature (F): 97.9 Height (in): 60 Pulse (bpm): 100 Weight (lbs): 106 Respiratory Rate (breaths/min): 16 Body Mass Index (BMI): 20.7 Blood Pressure (mmHg): 95/64 Reference Range: 80 - 120 mg / dl Electronic Signature(s) Signed: 04/29/2019 5:04:06 PM By: Gretta Cool, BSN, RN, CWS, Kim RN, BSN Entered By: Gretta Cool, BSN,  Lower Extremity Comorbid History: Cataracts, Chronic sinus Cataracts, Chronic sinus N/A problems/congestion, Anemia, problems/congestion, Anemia, Congestive Heart Failure, Congestive Heart Failure, Type II Diabetes, End Stage Type II Diabetes, End Stage Renal Disease, Lupus Renal Disease, Lupus Erythematosus, Neuropathy, Erythematosus, Neuropathy, Received Radiation Received Radiation Date Acquired: 03/18/2019 04/26/2019 N/A Weeks of Treatment: 0 0 N/A Wound Status: Open Open N/A Measurements L x W x D 2.8x1.6x0.1 1.6x0.5x0.3 N/A (cm) Area (cm) : 3.519 0.628 N/A Volume (cm) : 0.352 0.188 N/A % Reduction in Area: 0.00% N/A N/A % Reduction in Volume: 0.00% N/A N/A Classification: Full Thickness Without Full Thickness Without N/A Exposed Support Structures Exposed Support Structures Exudate Amount: Medium None Present N/A Exudate Type: Serous N/A N/A Exudate Color: amber N/A N/A Wound Margin: Indistinct, nonvisible Flat and Intact N/A Granulation Amount: None Present (0%) Large (67-100%) N/A Granulation Quality: N/A Pink N/A Cathell, Gizella B. ([id number]) Necrotic Amount: Large (67-100%) None  Present (0%) N/A Necrotic Tissue: Eschar N/A N/A Exposed Structures: Fat Layer (Subcutaneous Fat Layer (Subcutaneous N/A Tissue) Exposed: Yes Tissue) Exposed: Yes Fascia: No Fascia: No Tendon: No Tendon: No Muscle: No Muscle: No Joint: No Joint: No Bone: No Bone: No Epithelialization: None None N/A Treatment Notes Electronic Signature(s) Signed: 04/29/2019 4:21:56 PM By: Harold Barban Entered By: Harold Barban on 04/29/2019 13:55:46 [place], Analaura Jacinto Reap ([id number]) -------------------------------------------------------------------------------- [place] Details Patient Name: Diana Bene B. Date of Service: 04/29/2019 1:15 PM Medical Record Number: [id number] Patient Account Number: [id number] Date of Birth/Sex: 1934-09-24 (84 y.o. F) Treating RN: Harold Barban Primary Care Jermond Burkemper: Deborra Medina Other Clinician: Referring Yuritzy Zehring: Deborra Medina Treating Millan Legan/Extender: Melburn Hake, HOYT Weeks in Treatment: 0 Active Inactive Wound/Skin Impairment Nursing Diagnoses: Impaired tissue integrity Goals: Ulcer/skin breakdown will have a volume reduction of 30% by week 4 Date Initiated: 04/29/2019 Target Resolution Date: 05/30/2019 Goal Status: Active Interventions: Assess patient/caregiver ability to obtain necessary supplies Assess patient/caregiver ability to perform ulcer/skin care regimen upon admission and as needed Assess ulceration(s) every visit Notes: Electronic Signature(s) Signed: 04/29/2019 4:21:56 PM By: Harold Barban Entered By: Harold Barban on 04/29/2019 13:55:30 [place], Brinlynn B. ([id number]) -------------------------------------------------------------------------------- Pain Assessment Details Patient Name: Oleksy, Jenny B. Date of Service: 04/29/2019 1:15 PM Medical Record Number: [id number] Patient Account Number: [id number] Date of Birth/Sex: [birth year] (84 y.o. F) Treating RN: Cornell Barman Primary Care Caasi Giglia: Deborra Medina Other Clinician: Referring Page Pucciarelli: Deborra Medina Treating Bevin Mayall/Extender: Melburn Hake, HOYT Weeks in Treatment: 0 Active Problems Location of Pain Severity and Description of Pain Patient Has Paino Yes Site Locations Pain Location: Generalized Pain Pain Management and Medication Current Pain Management: Goals for Pain Management Patient states she has pain in her hip. Electronic Signature(s) Signed: 04/29/2019 5:04:06 PM By: Gretta Cool, BSN, RN, CWS, Kim RN, BSN Entered By: Gretta Cool, BSN, RN, CWS, Kim on 04/29/2019 13:16:36 Fenn, [name] ([id number]) -------------------------------------------------------------------------------- Patient/Caregiver Education Details Patient Name: Diana Bene B. Date of Service: 04/29/2019 1:15 PM Medical Record Number: [id number] Patient Account Number: [id number] Date of Birth/Gender: September 25, 1934 (84 y.o. F) Treating RN: Harold Barban Primary Care Physician: Deborra Medina Other Clinician: Referring Physician: Deborra Medina Treating Physician/Extender: Melburn Hake, HOYT Weeks in Treatment: 0 Education Assessment Education Provided To: Patient Education Topics Provided Wound/Skin Impairment: Handouts: Caring for Your Ulcer Methods: Demonstration, Explain/Verbal Responses: State content correctly Electronic Signature(s) Signed: 04/29/2019 4:21:56 PM By: Harold Barban Entered By: Harold Barban on 04/29/2019 13:56:46 Gigante, Soumya B. ([id number]) -------------------------------------------------------------------------------- Wound Assessment Details Patient Name: Delagarza, Sidra B. Date of Service: 04/29/2019 1:15 PM Medical Record Number: [id number] Patient Account

## 2019-04-30 ENCOUNTER — Telehealth: Payer: Self-pay

## 2019-04-30 MED ORDER — FENTANYL 50 MCG/HR TD PT72: 1.0000 | MEDICATED_PATCH | TRANSDERMAL | 0 refills | Status: AC

## 2019-04-30 NOTE — Addendum Note (Signed)
Addended by: Crecencio Mc on: 04/30/2019 04:50 PM   Modules accepted: Orders

## 2019-04-30 NOTE — Progress Notes (Signed)
08-23-1935 (84 y.o. F) Treating RN: Cornell Barman Primary Care Provider: Deborra Medina Other Clinician: Referring Provider: Deborra Medina Treating Provider/Extender: Melburn Hake, Klyde Banka Weeks in Treatment: 0 Information Obtained From Patient Constitutional Symptoms (General Health) Complaints and Symptoms: Negative for: Fatigue; Fever; Chills; Marked Weight Change Medical History: Past Medical History Notes: Lupus (Lungs and Kidneys), DM II, HTN, CHF, Hypo thyroidism Eyes Complaints and Symptoms: Negative for: Dry Eyes; Vision Changes; Glasses / Contacts Medical History: Positive for: Cataracts - removed Hematologic/Lymphatic Complaints and Symptoms: Negative for: Bleeding / Clotting Disorders; Human Immunodeficiency Virus Medical History: Positive for: Anemia - Injections every 3  weeks Respiratory Complaints and Symptoms: Positive for: Shortness of Breath Negative for: Chronic or frequent coughs Cardiovascular Complaints and Symptoms: Positive for: LE edema Negative for: Chest pain Medical History: Positive for: Congestive Heart Failure Negative for: Hypertension Gastrointestinal Complaints and Symptoms: Negative for: Frequent diarrhea; Nausea; Vomiting Genitourinary Releford, Annica B. ([id number]) Complaints and Symptoms: Negative for: Kidney failure/ Dialysis Medical History: Positive for: End Stage Renal Disease - CKD3 Immunological Complaints and Symptoms: Negative for: Hives; Itching Medical History: Positive for: Lupus Erythematosus Integumentary (Skin) Complaints and Symptoms: Positive for: Wounds; Swelling Neurologic Complaints and Symptoms: Negative for: Numbness/parasthesias; Focal/Weakness Medical History: Positive for: Neuropathy Ear/Nose/Mouth/Throat Medical History: Positive for: Chronic sinus problems/congestion Endocrine Medical History: Positive for: Type II Diabetes - no medication taken Time with diabetes: Not sure Musculoskeletal Medical History: Past Medical History Notes: Pain in hip Oncologic Complaints and Symptoms: Review of System Notes: Breast cancer left (6-7 years ago) Medical History: Positive for: Received Radiation Negative for: Received Chemotherapy HBO Extended History Items Eyes: Ear/Nose/Mouth/Throat: Cataracts Chronic sinus problems/congestion Bernardy, Cattleya B. ([id number]) Immunizations Pneumococcal Vaccine: Received Pneumococcal Vaccination: No Implantable Devices None Family and Social History Cancer: Yes - Siblings; Diabetes: No; Heart Disease: Yes - Mother; Hypertension: No; Kidney Disease: No; Lung Disease: No; Seizures: No; Stroke: No; Thyroid Problems: No; Tuberculosis: Yes - Mother; Former smoker - Quit 40 years ago; Marital Status - Divorced; Alcohol Use: Rarely; Drug Use: No  History; Caffeine Use: Moderate Electronic Signature(s) Signed: 04/29/2019 5:04:06 PM By: Gretta Cool, BSN, RN, CWS, Kim RN, BSN Signed: 04/29/2019 5:48:01 PM By: Worthy Keeler PA-C Entered By: Gretta Cool, BSN, RN, CWS, Kim on 04/29/2019 13:29:05 Caruth, [name] ([id number]) -------------------------------------------------------------------------------- SuperBill Details Patient Name: Diana [name], [name] Date of Service: 04/29/2019 Medical Record Number: [id number] Patient Account Number: [id number] Date of Birth/Sex: Jun 26, 1935 (84 y.o. F) Treating RN: Harold Barban Primary Care Provider: Deborra Medina Other Clinician: Referring Provider: Deborra Medina Treating Provider/Extender: Melburn Hake, Felipa Laroche Weeks in Treatment: 0 Diagnosis Coding ICD-10 Codes Code Description I89.0 Lymphedema, not elsewhere classified L97.812 Non-pressure chronic ulcer of other part of right lower leg with fat layer exposed L97.822 Non-pressure chronic ulcer of other part of left lower leg with fat layer exposed I50.42 Chronic combined systolic (congestive) and diastolic (congestive) heart failure N18.3 Chronic kidney disease, stage 3 (moderate) D50.8 Other iron deficiency anemias I10 Essential (primary) hypertension Z79.01 Long term (current) use of anticoagulants M32.8 Other forms of systemic lupus erythematosus Facility Procedures CPT4 Code: [id number] Description: 99214 - WOUND CARE VISIT-LEV 4 EST PT Modifier: Quantity: 1 Physician Procedures CPT4 Code Description: 2637858 85027 - WC PHYS LEVEL 4 - NEW PT ICD-10 Diagnosis Description I89.0 Lymphedema, not elsewhere classified L97.812 Non-pressure chronic ulcer of other part of right lower leg wit L97.822 Non-pressure chronic ulcer of other  part of left lower leg with I50.42 Chronic combined systolic (congestive) and diastolic (congestiv Modifier: h fat layer expo fat  08-23-1935 (84 y.o. F) Treating RN: Cornell Barman Primary Care Provider: Deborra Medina Other Clinician: Referring Provider: Deborra Medina Treating Provider/Extender: Melburn Hake, Klyde Banka Weeks in Treatment: 0 Information Obtained From Patient Constitutional Symptoms (General Health) Complaints and Symptoms: Negative for: Fatigue; Fever; Chills; Marked Weight Change Medical History: Past Medical History Notes: Lupus (Lungs and Kidneys), DM II, HTN, CHF, Hypo thyroidism Eyes Complaints and Symptoms: Negative for: Dry Eyes; Vision Changes; Glasses / Contacts Medical History: Positive for: Cataracts - removed Hematologic/Lymphatic Complaints and Symptoms: Negative for: Bleeding / Clotting Disorders; Human Immunodeficiency Virus Medical History: Positive for: Anemia - Injections every 3  weeks Respiratory Complaints and Symptoms: Positive for: Shortness of Breath Negative for: Chronic or frequent coughs Cardiovascular Complaints and Symptoms: Positive for: LE edema Negative for: Chest pain Medical History: Positive for: Congestive Heart Failure Negative for: Hypertension Gastrointestinal Complaints and Symptoms: Negative for: Frequent diarrhea; Nausea; Vomiting Genitourinary Releford, Annica B. ([id number]) Complaints and Symptoms: Negative for: Kidney failure/ Dialysis Medical History: Positive for: End Stage Renal Disease - CKD3 Immunological Complaints and Symptoms: Negative for: Hives; Itching Medical History: Positive for: Lupus Erythematosus Integumentary (Skin) Complaints and Symptoms: Positive for: Wounds; Swelling Neurologic Complaints and Symptoms: Negative for: Numbness/parasthesias; Focal/Weakness Medical History: Positive for: Neuropathy Ear/Nose/Mouth/Throat Medical History: Positive for: Chronic sinus problems/congestion Endocrine Medical History: Positive for: Type II Diabetes - no medication taken Time with diabetes: Not sure Musculoskeletal Medical History: Past Medical History Notes: Pain in hip Oncologic Complaints and Symptoms: Review of System Notes: Breast cancer left (6-7 years ago) Medical History: Positive for: Received Radiation Negative for: Received Chemotherapy HBO Extended History Items Eyes: Ear/Nose/Mouth/Throat: Cataracts Chronic sinus problems/congestion Bernardy, Cattleya B. ([id number]) Immunizations Pneumococcal Vaccine: Received Pneumococcal Vaccination: No Implantable Devices None Family and Social History Cancer: Yes - Siblings; Diabetes: No; Heart Disease: Yes - Mother; Hypertension: No; Kidney Disease: No; Lung Disease: No; Seizures: No; Stroke: No; Thyroid Problems: No; Tuberculosis: Yes - Mother; Former smoker - Quit 40 years ago; Marital Status - Divorced; Alcohol Use: Rarely; Drug Use: No  History; Caffeine Use: Moderate Electronic Signature(s) Signed: 04/29/2019 5:04:06 PM By: Gretta Cool, BSN, RN, CWS, Kim RN, BSN Signed: 04/29/2019 5:48:01 PM By: Worthy Keeler PA-C Entered By: Gretta Cool, BSN, RN, CWS, Kim on 04/29/2019 13:29:05 Caruth, [name] ([id number]) -------------------------------------------------------------------------------- SuperBill Details Patient Name: Diana [name], [name] Date of Service: 04/29/2019 Medical Record Number: [id number] Patient Account Number: [id number] Date of Birth/Sex: Jun 26, 1935 (84 y.o. F) Treating RN: Harold Barban Primary Care Provider: Deborra Medina Other Clinician: Referring Provider: Deborra Medina Treating Provider/Extender: Melburn Hake, Felipa Laroche Weeks in Treatment: 0 Diagnosis Coding ICD-10 Codes Code Description I89.0 Lymphedema, not elsewhere classified L97.812 Non-pressure chronic ulcer of other part of right lower leg with fat layer exposed L97.822 Non-pressure chronic ulcer of other part of left lower leg with fat layer exposed I50.42 Chronic combined systolic (congestive) and diastolic (congestive) heart failure N18.3 Chronic kidney disease, stage 3 (moderate) D50.8 Other iron deficiency anemias I10 Essential (primary) hypertension Z79.01 Long term (current) use of anticoagulants M32.8 Other forms of systemic lupus erythematosus Facility Procedures CPT4 Code: [id number] Description: 99214 - WOUND CARE VISIT-LEV 4 EST PT Modifier: Quantity: 1 Physician Procedures CPT4 Code Description: [id number] 85027 - WC PHYS LEVEL 4 - NEW PT ICD-10 Diagnosis Description I89.0 Lymphedema, not elsewhere classified L97.812 Non-pressure chronic ulcer of other part of right lower leg wit L97.822 Non-pressure chronic ulcer of other  part of left lower leg with I50.42 Chronic combined systolic (congestive) and diastolic (congestiv Modifier: h fat layer expo fat  08-23-1935 (84 y.o. F) Treating RN: Cornell Barman Primary Care Provider: Deborra Medina Other Clinician: Referring Provider: Deborra Medina Treating Provider/Extender: Melburn Hake, Klyde Banka Weeks in Treatment: 0 Information Obtained From Patient Constitutional Symptoms (General Health) Complaints and Symptoms: Negative for: Fatigue; Fever; Chills; Marked Weight Change Medical History: Past Medical History Notes: Lupus (Lungs and Kidneys), DM II, HTN, CHF, Hypo thyroidism Eyes Complaints and Symptoms: Negative for: Dry Eyes; Vision Changes; Glasses / Contacts Medical History: Positive for: Cataracts - removed Hematologic/Lymphatic Complaints and Symptoms: Negative for: Bleeding / Clotting Disorders; Human Immunodeficiency Virus Medical History: Positive for: Anemia - Injections every 3  weeks Respiratory Complaints and Symptoms: Positive for: Shortness of Breath Negative for: Chronic or frequent coughs Cardiovascular Complaints and Symptoms: Positive for: LE edema Negative for: Chest pain Medical History: Positive for: Congestive Heart Failure Negative for: Hypertension Gastrointestinal Complaints and Symptoms: Negative for: Frequent diarrhea; Nausea; Vomiting Genitourinary Releford, Annica B. ([id number]) Complaints and Symptoms: Negative for: Kidney failure/ Dialysis Medical History: Positive for: End Stage Renal Disease - CKD3 Immunological Complaints and Symptoms: Negative for: Hives; Itching Medical History: Positive for: Lupus Erythematosus Integumentary (Skin) Complaints and Symptoms: Positive for: Wounds; Swelling Neurologic Complaints and Symptoms: Negative for: Numbness/parasthesias; Focal/Weakness Medical History: Positive for: Neuropathy Ear/Nose/Mouth/Throat Medical History: Positive for: Chronic sinus problems/congestion Endocrine Medical History: Positive for: Type II Diabetes - no medication taken Time with diabetes: Not sure Musculoskeletal Medical History: Past Medical History Notes: Pain in hip Oncologic Complaints and Symptoms: Review of System Notes: Breast cancer left (6-7 years ago) Medical History: Positive for: Received Radiation Negative for: Received Chemotherapy HBO Extended History Items Eyes: Ear/Nose/Mouth/Throat: Cataracts Chronic sinus problems/congestion Bernardy, Cattleya B. ([id number]) Immunizations Pneumococcal Vaccine: Received Pneumococcal Vaccination: No Implantable Devices None Family and Social History Cancer: Yes - Siblings; Diabetes: No; Heart Disease: Yes - Mother; Hypertension: No; Kidney Disease: No; Lung Disease: No; Seizures: No; Stroke: No; Thyroid Problems: No; Tuberculosis: Yes - Mother; Former smoker - Quit 40 years ago; Marital Status - Divorced; Alcohol Use: Rarely; Drug Use: No  History; Caffeine Use: Moderate Electronic Signature(s) Signed: 04/29/2019 5:04:06 PM By: Gretta Cool, BSN, RN, CWS, Kim RN, BSN Signed: 04/29/2019 5:48:01 PM By: Worthy Keeler PA-C Entered By: Gretta Cool, BSN, RN, CWS, Kim on 04/29/2019 13:29:05 Caruth, [name] ([id number]) -------------------------------------------------------------------------------- SuperBill Details Patient Name: Diana [name], [name] Date of Service: 04/29/2019 Medical Record Number: [id number] Patient Account Number: [id number] Date of Birth/Sex: Jun 26, 1935 (84 y.o. F) Treating RN: Harold Barban Primary Care Provider: Deborra Medina Other Clinician: Referring Provider: Deborra Medina Treating Provider/Extender: Melburn Hake, Felipa Laroche Weeks in Treatment: 0 Diagnosis Coding ICD-10 Codes Code Description I89.0 Lymphedema, not elsewhere classified L97.812 Non-pressure chronic ulcer of other part of right lower leg with fat layer exposed L97.822 Non-pressure chronic ulcer of other part of left lower leg with fat layer exposed I50.42 Chronic combined systolic (congestive) and diastolic (congestive) heart failure N18.3 Chronic kidney disease, stage 3 (moderate) D50.8 Other iron deficiency anemias I10 Essential (primary) hypertension Z79.01 Long term (current) use of anticoagulants M32.8 Other forms of systemic lupus erythematosus Facility Procedures CPT4 Code: [id number] Description: 99214 - WOUND CARE VISIT-LEV 4 EST PT Modifier: Quantity: 1 Physician Procedures CPT4 Code Description: [id number] 85027 - WC PHYS LEVEL 4 - NEW PT ICD-10 Diagnosis Description I89.0 Lymphedema, not elsewhere classified L97.812 Non-pressure chronic ulcer of other part of right lower leg wit L97.822 Non-pressure chronic ulcer of other  part of left lower leg with I50.42 Chronic combined systolic (congestive) and diastolic (congestiv Modifier: h fat layer expo fat  showed evidence of appearing fairly well as far as the right lower extremity is concerned. There does not appear to be any signs of active infection at this time which is good news. With that being said I do see a tremendous amount of edema in the bilateral lower extremities obviously this is something that does need to be addressed. With that being said I think we are going to have to be very cautious and careful just due to how fragile her legs appear. The patient understands. Nonetheless she states that she does want something to try to help with the fluid which I also completely understand. Electronic Signature(s) Signed: 04/29/2019 2:19:40 PM By: Worthy Keeler PA-C Entered By: Worthy Keeler on 04/29/2019 14:19:39 [place], [name] ([id number]) -------------------------------------------------------------------------------- Physician Orders Details Patient Name: Diana Bene B. Date of Service: 04/29/2019 1:15 PM Medical Record Number: [id number] Patient Account Number: [id number] Date of Birth/Sex: 03/01/1935 (84 y.o. F) Treating RN: Harold Barban Primary Care Provider: Deborra Medina Other Clinician: Referring Provider: Deborra Medina Treating Provider/Extender: Melburn Hake, Makensey Rego Weeks in Treatment: 0 Verbal / Phone Orders: No Diagnosis Coding ICD-10 Coding Code Description I89.0 Lymphedema, not elsewhere classified L97.812 Non-pressure chronic ulcer of other part of right lower leg with fat layer exposed I50.42 Chronic  combined systolic (congestive) and diastolic (congestive) heart failure N18.3 Chronic kidney disease, stage 3 (moderate) D50.8 Other iron deficiency anemias I10 Essential (primary) hypertension Z79.01 Long term (current) use of anticoagulants M32.8 Other forms of systemic lupus erythematosus Wound Cleansing Wound #1 Left Lower Leg o Dial antibacterial soap, wash wounds, rinse and pat dry prior to dressing wounds Wound #2 Right,Lateral Lower Leg o Dial antibacterial soap, wash wounds, rinse and pat dry prior to dressing wounds Primary Wound Dressing Wound #1 Left Lower Leg o Silver Collagen Wound #2 Right,Lateral Lower Leg o Silver Collagen Secondary Dressing Wound #1 Left Lower Leg o ABD pad Wound #2 Right,Lateral Lower Leg o ABD pad Dressing Change Frequency Wound #1 Left Lower Leg o Change Dressing Monday, Wednesday, Friday - Mondays-dressing changes in clinic. Wednesdays and Fridays- [place] will do dressing changes Wound #2 Right,Lateral Lower Leg o Change Dressing Monday, Wednesday, Friday - Mondays-dressing changes in clinic. Wednesdays and Fridays- [place] will do dressing changes Tsutsui, Onie B. ([id number]) Follow-up Appointments Wound #1 Left Lower Leg o Return Appointment in 1 week. Wound #2 Right,Lateral Lower Leg o Return Appointment in 1 week. Edema Control Wound #1 Left Lower Leg o Other: - Tubi Grip Wound #2 Right,Lateral Lower Leg o Other: - Tubi Grip Off-Loading Wound #1 Left Lower Leg o Other: - Elevate legs when sitting Wound #2 Right,Lateral Lower Leg o Other: - Elevate legs when sitting Electronic Signature(s) Signed: 04/29/2019 4:21:56 PM By: Harold Barban Signed: 04/29/2019 5:48:01 PM By: Worthy Keeler PA-C Entered By: Harold Barban on 04/29/2019 14:10:20 Carreon, Prim B. ([id number]) -------------------------------------------------------------------------------- Problem List Details Patient Name:  Diana [name], [name] Date of Service: 04/29/2019 1:15 PM Medical Record Number: [id number] Patient Account Number: [id number] Date of Birth/Sex: [birth year] (84 y.o. F) Treating RN: Harold Barban Primary Care Provider: Deborra Medina Other Clinician: Referring Provider: Deborra Medina Treating Provider/Extender: Melburn Hake, Robinson Brinkley Weeks in Treatment: 0 Active Problems ICD-10 Evaluated Encounter Code Description Active Date Today Diagnosis I89.0 Lymphedema, not elsewhere classified 04/29/2019 No Yes L97.812 Non-pressure chronic ulcer of other part of right lower leg 04/29/2019 No Yes with fat layer exposed L97.822 Non-pressure chronic ulcer of other part of left lower leg with 04/29/2019 No Yes fat layer  showed evidence of appearing fairly well as far as the right lower extremity is concerned. There does not appear to be any signs of active infection at this time which is good news. With that being said I do see a tremendous amount of edema in the bilateral lower extremities obviously this is something that does need to be addressed. With that being said I think we are going to have to be very cautious and careful just due to how fragile her legs appear. The patient understands. Nonetheless she states that she does want something to try to help with the fluid which I also completely understand. Electronic Signature(s) Signed: 04/29/2019 2:19:40 PM By: Worthy Keeler PA-C Entered By: Worthy Keeler on 04/29/2019 14:19:39 [place], [name] ([id number]) -------------------------------------------------------------------------------- Physician Orders Details Patient Name: Diana Bene B. Date of Service: 04/29/2019 1:15 PM Medical Record Number: [id number] Patient Account Number: [id number] Date of Birth/Sex: 03/01/1935 (84 y.o. F) Treating RN: Harold Barban Primary Care Provider: Deborra Medina Other Clinician: Referring Provider: Deborra Medina Treating Provider/Extender: Melburn Hake, Makensey Rego Weeks in Treatment: 0 Verbal / Phone Orders: No Diagnosis Coding ICD-10 Coding Code Description I89.0 Lymphedema, not elsewhere classified L97.812 Non-pressure chronic ulcer of other part of right lower leg with fat layer exposed I50.42 Chronic  combined systolic (congestive) and diastolic (congestive) heart failure N18.3 Chronic kidney disease, stage 3 (moderate) D50.8 Other iron deficiency anemias I10 Essential (primary) hypertension Z79.01 Long term (current) use of anticoagulants M32.8 Other forms of systemic lupus erythematosus Wound Cleansing Wound #1 Left Lower Leg o Dial antibacterial soap, wash wounds, rinse and pat dry prior to dressing wounds Wound #2 Right,Lateral Lower Leg o Dial antibacterial soap, wash wounds, rinse and pat dry prior to dressing wounds Primary Wound Dressing Wound #1 Left Lower Leg o Silver Collagen Wound #2 Right,Lateral Lower Leg o Silver Collagen Secondary Dressing Wound #1 Left Lower Leg o ABD pad Wound #2 Right,Lateral Lower Leg o ABD pad Dressing Change Frequency Wound #1 Left Lower Leg o Change Dressing Monday, Wednesday, Friday - Mondays-dressing changes in clinic. Wednesdays and Fridays- [place] will do dressing changes Wound #2 Right,Lateral Lower Leg o Change Dressing Monday, Wednesday, Friday - Mondays-dressing changes in clinic. Wednesdays and Fridays- [place] will do dressing changes Tsutsui, Onie B. ([id number]) Follow-up Appointments Wound #1 Left Lower Leg o Return Appointment in 1 week. Wound #2 Right,Lateral Lower Leg o Return Appointment in 1 week. Edema Control Wound #1 Left Lower Leg o Other: - Tubi Grip Wound #2 Right,Lateral Lower Leg o Other: - Tubi Grip Off-Loading Wound #1 Left Lower Leg o Other: - Elevate legs when sitting Wound #2 Right,Lateral Lower Leg o Other: - Elevate legs when sitting Electronic Signature(s) Signed: 04/29/2019 4:21:56 PM By: Harold Barban Signed: 04/29/2019 5:48:01 PM By: Worthy Keeler PA-C Entered By: Harold Barban on 04/29/2019 14:10:20 Carreon, Prim B. ([id number]) -------------------------------------------------------------------------------- Problem List Details Patient Name:  Diana [name], [name] Date of Service: 04/29/2019 1:15 PM Medical Record Number: [id number] Patient Account Number: [id number] Date of Birth/Sex: [birth year] (84 y.o. F) Treating RN: Harold Barban Primary Care Provider: Deborra Medina Other Clinician: Referring Provider: Deborra Medina Treating Provider/Extender: Melburn Hake, Robinson Brinkley Weeks in Treatment: 0 Active Problems ICD-10 Evaluated Encounter Code Description Active Date Today Diagnosis I89.0 Lymphedema, not elsewhere classified 04/29/2019 No Yes L97.812 Non-pressure chronic ulcer of other part of right lower leg 04/29/2019 No Yes with fat layer exposed L97.822 Non-pressure chronic ulcer of other part of left lower leg with 04/29/2019 No Yes fat layer  08-23-1935 (84 y.o. F) Treating RN: Cornell Barman Primary Care Provider: Deborra Medina Other Clinician: Referring Provider: Deborra Medina Treating Provider/Extender: Melburn Hake, Klyde Banka Weeks in Treatment: 0 Information Obtained From Patient Constitutional Symptoms (General Health) Complaints and Symptoms: Negative for: Fatigue; Fever; Chills; Marked Weight Change Medical History: Past Medical History Notes: Lupus (Lungs and Kidneys), DM II, HTN, CHF, Hypo thyroidism Eyes Complaints and Symptoms: Negative for: Dry Eyes; Vision Changes; Glasses / Contacts Medical History: Positive for: Cataracts - removed Hematologic/Lymphatic Complaints and Symptoms: Negative for: Bleeding / Clotting Disorders; Human Immunodeficiency Virus Medical History: Positive for: Anemia - Injections every 3  weeks Respiratory Complaints and Symptoms: Positive for: Shortness of Breath Negative for: Chronic or frequent coughs Cardiovascular Complaints and Symptoms: Positive for: LE edema Negative for: Chest pain Medical History: Positive for: Congestive Heart Failure Negative for: Hypertension Gastrointestinal Complaints and Symptoms: Negative for: Frequent diarrhea; Nausea; Vomiting Genitourinary Releford, Annica B. ([id number]) Complaints and Symptoms: Negative for: Kidney failure/ Dialysis Medical History: Positive for: End Stage Renal Disease - CKD3 Immunological Complaints and Symptoms: Negative for: Hives; Itching Medical History: Positive for: Lupus Erythematosus Integumentary (Skin) Complaints and Symptoms: Positive for: Wounds; Swelling Neurologic Complaints and Symptoms: Negative for: Numbness/parasthesias; Focal/Weakness Medical History: Positive for: Neuropathy Ear/Nose/Mouth/Throat Medical History: Positive for: Chronic sinus problems/congestion Endocrine Medical History: Positive for: Type II Diabetes - no medication taken Time with diabetes: Not sure Musculoskeletal Medical History: Past Medical History Notes: Pain in hip Oncologic Complaints and Symptoms: Review of System Notes: Breast cancer left (6-7 years ago) Medical History: Positive for: Received Radiation Negative for: Received Chemotherapy HBO Extended History Items Eyes: Ear/Nose/Mouth/Throat: Cataracts Chronic sinus problems/congestion Bernardy, Cattleya B. ([id number]) Immunizations Pneumococcal Vaccine: Received Pneumococcal Vaccination: No Implantable Devices None Family and Social History Cancer: Yes - Siblings; Diabetes: No; Heart Disease: Yes - Mother; Hypertension: No; Kidney Disease: No; Lung Disease: No; Seizures: No; Stroke: No; Thyroid Problems: No; Tuberculosis: Yes - Mother; Former smoker - Quit 40 years ago; Marital Status - Divorced; Alcohol Use: Rarely; Drug Use: No  History; Caffeine Use: Moderate Electronic Signature(s) Signed: 04/29/2019 5:04:06 PM By: Gretta Cool, BSN, RN, CWS, Kim RN, BSN Signed: 04/29/2019 5:48:01 PM By: Worthy Keeler PA-C Entered By: Gretta Cool, BSN, RN, CWS, Kim on 04/29/2019 13:29:05 [place]uth, [name] ([id number]) -------------------------------------------------------------------------------- SuperBill Details Patient Name: Diana [name], [name] Date of Service: 04/29/2019 Medical Record Number: [id number] Patient Account Number: [id number] Date of Birth/Sex: Jun 26, 1935 (84 y.o. F) Treating RN: Harold Barban Primary Care Provider: Deborra Medina Other Clinician: Referring Provider: Deborra Medina Treating Provider/Extender: Melburn Hake, Felipa Laroche Weeks in Treatment: 0 Diagnosis Coding ICD-10 Codes Code Description I89.0 Lymphedema, not elsewhere classified L97.812 Non-pressure chronic ulcer of other part of right lower leg with fat layer exposed L97.822 Non-pressure chronic ulcer of other part of left lower leg with fat layer exposed I50.42 Chronic combined systolic (congestive) and diastolic (congestive) heart failure N18.3 Chronic kidney disease, stage 3 (moderate) D50.8 Other iron deficiency anemias I10 Essential (primary) hypertension Z79.01 Long term (current) use of anticoagulants M32.8 Other forms of systemic lupus erythematosus Facility Procedures CPT4 Code: [id number] Description: 99214 - WOUND CARE VISIT-LEV 4 EST PT Modifier: Quantity: 1 Physician Procedures CPT4 Code Description: [id number] 85027 - WC PHYS LEVEL 4 - NEW PT ICD-10 Diagnosis Description I89.0 Lymphedema, not elsewhere classified L97.812 Non-pressure chronic ulcer of other part of right lower leg wit L97.822 Non-pressure chronic ulcer of other  part of left lower leg with I50.42 Chronic combined systolic (congestive) and diastolic (congestiv Modifier: h fat layer expo fat  showed evidence of appearing fairly well as far as the right lower extremity is concerned. There does not appear to be any signs of active infection at this time which is good news. With that being said I do see a tremendous amount of edema in the bilateral lower extremities obviously this is something that does need to be addressed. With that being said I think we are going to have to be very cautious and careful just due to how fragile her legs appear. The patient understands. Nonetheless she states that she does want something to try to help with the fluid which I also completely understand. Electronic Signature(s) Signed: 04/29/2019 2:19:40 PM By: Worthy Keeler PA-C Entered By: Worthy Keeler on 04/29/2019 14:19:39 [place], [name] ([id number]) -------------------------------------------------------------------------------- Physician Orders Details Patient Name: Diana Bene B. Date of Service: 04/29/2019 1:15 PM Medical Record Number: [id number] Patient Account Number: [id number] Date of Birth/Sex: 03/01/1935 (84 y.o. F) Treating RN: Harold Barban Primary Care Provider: Deborra Medina Other Clinician: Referring Provider: Deborra Medina Treating Provider/Extender: Melburn Hake, Makensey Rego Weeks in Treatment: 0 Verbal / Phone Orders: No Diagnosis Coding ICD-10 Coding Code Description I89.0 Lymphedema, not elsewhere classified L97.812 Non-pressure chronic ulcer of other part of right lower leg with fat layer exposed I50.42 Chronic  combined systolic (congestive) and diastolic (congestive) heart failure N18.3 Chronic kidney disease, stage 3 (moderate) D50.8 Other iron deficiency anemias I10 Essential (primary) hypertension Z79.01 Long term (current) use of anticoagulants M32.8 Other forms of systemic lupus erythematosus Wound Cleansing Wound #1 Left Lower Leg o Dial antibacterial soap, wash wounds, rinse and pat dry prior to dressing wounds Wound #2 Right,Lateral Lower Leg o Dial antibacterial soap, wash wounds, rinse and pat dry prior to dressing wounds Primary Wound Dressing Wound #1 Left Lower Leg o Silver Collagen Wound #2 Right,Lateral Lower Leg o Silver Collagen Secondary Dressing Wound #1 Left Lower Leg o ABD pad Wound #2 Right,Lateral Lower Leg o ABD pad Dressing Change Frequency Wound #1 Left Lower Leg o Change Dressing Monday, Wednesday, Friday - Mondays-dressing changes in clinic. Wednesdays and Fridays- [place] will do dressing changes Wound #2 Right,Lateral Lower Leg o Change Dressing Monday, Wednesday, Friday - Mondays-dressing changes in clinic. Wednesdays and Fridays- [place] will do dressing changes Tsutsui, Onie B. ([id number]) Follow-up Appointments Wound #1 Left Lower Leg o Return Appointment in 1 week. Wound #2 Right,Lateral Lower Leg o Return Appointment in 1 week. Edema Control Wound #1 Left Lower Leg o Other: - Tubi Grip Wound #2 Right,Lateral Lower Leg o Other: - Tubi Grip Off-Loading Wound #1 Left Lower Leg o Other: - Elevate legs when sitting Wound #2 Right,Lateral Lower Leg o Other: - Elevate legs when sitting Electronic Signature(s) Signed: 04/29/2019 4:21:56 PM By: Harold Barban Signed: 04/29/2019 5:48:01 PM By: Worthy Keeler PA-C Entered By: Harold Barban on 04/29/2019 14:10:20 Carreon, Prim B. ([id number]) -------------------------------------------------------------------------------- Problem List Details Patient Name:  Diana [name], [name] Date of Service: 04/29/2019 1:15 PM Medical Record Number: [id number] Patient Account Number: [id number] Date of Birth/Sex: [birth year] (84 y.o. F) Treating RN: Harold Barban Primary Care Provider: Deborra Medina Other Clinician: Referring Provider: Deborra Medina Treating Provider/Extender: Melburn Hake, Robinson Brinkley Weeks in Treatment: 0 Active Problems ICD-10 Evaluated Encounter Code Description Active Date Today Diagnosis I89.0 Lymphedema, not elsewhere classified 04/29/2019 No Yes L97.812 Non-pressure chronic ulcer of other part of right lower leg 04/29/2019 No Yes with fat layer exposed L97.822 Non-pressure chronic ulcer of other part of left lower leg with 04/29/2019 No Yes fat layer

## 2019-04-30 NOTE — Telephone Encounter (Signed)
Spoke with pt to let her know that Dr. Derrel Nip has sent in the Fentanyl 50 mcg for her to use until we can do a prior authorization on the 62.5 mcg dose and pt stated that she does have some oxycodone to take if she were to have some breakthrough pain.

## 2019-04-30 NOTE — Telephone Encounter (Signed)
Copied from [place] [id number]. Topic: General - Other >> Apr 30, 2019  3:32 PM Mcneil, Ja-Kwan wrote: Reason for CRM: Pt stated she still does not have a pain patch. Pt stated her pharmacy told her that the insurance will not cover the dosage that was prescribed. Pt requests call back.

## 2019-04-30 NOTE — Telephone Encounter (Signed)
Have refilled the prior dose of fentanyl 50 mcg.  While you get authorization for the 62.5 mcg dose.  Does she have enough oxycodone for breakthrough pain?

## 2019-05-02 ENCOUNTER — Encounter: Payer: Self-pay | Admitting: Internal Medicine

## 2019-05-02 LAB — POCT INR: INR: 2.2 (ref 2.0–3.0)

## 2019-05-03 ENCOUNTER — Telehealth: Payer: Self-pay

## 2019-05-03 NOTE — Telephone Encounter (Signed)
Patient aware.

## 2019-05-03 NOTE — Telephone Encounter (Signed)
INR results faxed from [facility].  INR on 05/02/19 was 2.2

## 2019-05-03 NOTE — Telephone Encounter (Signed)
Coumadin level is therapeutic,  Continue current regimen and repeat PT/INR in one week 

## 2019-05-06 ENCOUNTER — Ambulatory Visit (INDEPENDENT_AMBULATORY_CARE_PROVIDER_SITE_OTHER): Payer: Medicare Other | Admitting: Internal Medicine

## 2019-05-06 ENCOUNTER — Encounter: Payer: Medicare Other | Admitting: Physician Assistant

## 2019-05-06 ENCOUNTER — Other Ambulatory Visit: Payer: Self-pay

## 2019-05-06 ENCOUNTER — Encounter: Payer: Self-pay | Admitting: Internal Medicine

## 2019-05-06 VITALS — BP 92/62 | HR 99 | Temp 97.9°F

## 2019-05-06 DIAGNOSIS — B258 Other cytomegaloviral diseases: Secondary | ICD-10-CM | POA: Diagnosis not present

## 2019-05-06 DIAGNOSIS — B259 Cytomegaloviral disease, unspecified: Secondary | ICD-10-CM

## 2019-05-06 DIAGNOSIS — K208 Other esophagitis: Secondary | ICD-10-CM | POA: Diagnosis not present

## 2019-05-06 DIAGNOSIS — L97822 Non-pressure chronic ulcer of other part of left lower leg with fat layer exposed: Secondary | ICD-10-CM | POA: Diagnosis not present

## 2019-05-06 DIAGNOSIS — I890 Lymphedema, not elsewhere classified: Secondary | ICD-10-CM | POA: Diagnosis not present

## 2019-05-06 DIAGNOSIS — Z5181 Encounter for therapeutic drug level monitoring: Secondary | ICD-10-CM | POA: Diagnosis not present

## 2019-05-06 DIAGNOSIS — S8981XA Other specified injuries of right lower leg, initial encounter: Secondary | ICD-10-CM | POA: Diagnosis not present

## 2019-05-06 DIAGNOSIS — L97812 Non-pressure chronic ulcer of other part of right lower leg with fat layer exposed: Secondary | ICD-10-CM | POA: Diagnosis not present

## 2019-05-06 MED ORDER — VALGANCICLOVIR HCL 450 MG PO TABS: 450.0000 mg | ORAL_TABLET | Freq: Two times a day (BID) | ORAL | 1 refills | Status: DC

## 2019-05-06 MED ORDER — VALGANCICLOVIR HCL 450 MG PO TABS: 450.0000 mg | ORAL_TABLET | ORAL | 1 refills | Status: AC

## 2019-05-06 NOTE — Assessment & Plan Note (Addendum)
Reactivation, worsening/new.  Will get her back on treatment with valganciclovir and try for 4-6 weeks.  It is unclear how much this is causing her any issues in regards to her dysphagia but certainly possible.  I will do a baseline CMV quant  rtc 3 weeks

## 2019-05-06 NOTE — Progress Notes (Signed)
HPI: Diana [name] is a 84 y.o. female who presents to the [facility] clinic today to see Dr. Linus Salmons for her CMV esophagitis.  Patient Active Problem List   Diagnosis Date Noted  . Lymphedema of both lower extremities 04/24/2019  . Problems with swallowing and mastication   . Nausea and vomiting   . Dysphasia   . Food impaction of esophagus   . Stricture and stenosis of esophagus   . Diverticulitis 03/10/2019  . Abdominal pain, chronic, left lower quadrant 12/13/2018  . Osteoporosis 08/11/2018  . Nocturnal hypoxemia due to pulmonary hypertension ([place]) 07/29/2018  . Tubular adenoma of colon 06/08/2018  . Chronic right hip pain 05/09/2018  . Hospital discharge follow-up 12/16/2017  . Ventral hernia without obstruction or gangrene 11/09/2017  . Anticoagulation goal of INR 2 to 3 09/14/2016  . Acquired asplenia 08/21/2016  . Sjogrens syndrome ([place]) 08/21/2016  . Long term systemic steroid user 08/17/2016  . Facet arthritis of lumbosacral region 12/16/2015  . History of Helicobacter pylori infection 08/17/2015  . H/O adenomatous polyp of colon 08/17/2015  . Hypertension 08/17/2015  . Hyperlipidemia, unspecified 08/17/2015  . Absolute anemia 07/17/2015  . History of compression fracture of vertebral column 07/03/2015  . Generalized weakness 07/03/2015  . Anemia, iron deficiency 07/03/2015  . Esophagitis, CMV (cytomegalovirus) ([place]) 05/26/2015  . History of fall within past 90 days 04/07/2015  . Protein-calorie malnutrition ([place]) 04/06/2015  . History of gastric surgery 03/24/2015  . Chronic nausea 03/24/2015  . History of shingles 11/10/2014  . Venous (peripheral) insufficiency 06/13/2014  . Breast cancer, left ([place]) 05/26/2014  . Malignant neoplasm of upper-outer quadrant of female breast ([place]) 05/26/2014  . Urinary incontinence 01/15/2014  . Other specified disorders of bladder 01/02/2014  . Familial multiple lipoprotein-type hyperlipidemia 08/21/2013  . History of recurrent  pneumonia 05/26/2013  . Pulmonary hypertensive venous disease ([place]) 04/23/2013  . Diastolic heart failure ([place]) 04/23/2013  . Long term current use of anticoagulant therapy 03/27/2013  . Incomplete bladder emptying 03/06/2013  . Chronic venous embolism and thrombosis of deep vessels of lower extremity ([place]) 01/07/2013  . Hernia, hiatal 01/06/2013  . Diaphragmatic hernia 01/06/2013  . Left leg DVT ([place]) 12/18/2012  . Sciatica 11/20/2012  . Microscopic hematuria 11/14/2012  . Insomnia 11/08/2011  . Lupus erythematosus 06/13/2008  . NEUROPATHY-PERIPHERAL 10/06/2006  . Hereditary and idiopathic peripheral neuropathy 10/06/2006  . Hypothyroidism 09/25/2006  . ANEMIA, B12 DEFICIENCY 09/25/2006  . GERD 09/25/2006  . Lung involvement in systemic lupus erythematosus ([place]) 09/25/2006  . Type 2 diabetes mellitus without complications ([place]) 40/06/2724  . SHINGLES, HX OF 09/25/2006  . TOBACCO ABUSE, HX OF 09/25/2006  . Personal history of endocrine, metabolic or immunity disorder 09/25/2006    Patient's Medications  New Prescriptions   VALGANCICLOVIR (VALCYTE) 450 MG TABLET    Take 1 tablet (450 mg total) by mouth 2 (two) times daily.  Previous Medications   ALPRAZOLAM (XANAX) 0.25 MG TABLET    Take 1 tablet (0.25 mg total) by mouth 2 (two) times daily as needed for anxiety.   ASCORBIC ACID (VITAMIN C) 1000 MG TABLET    Take 1,000 mg by mouth daily.   BUDESONIDE-FORMOTEROL (SYMBICORT) 160-4.5 MCG/ACT INHALER    Symbicort 160 mcg-4.5 mcg/actuation HFA aerosol inhaler   CALCIUM CARBONATE-VITAMIN D 600-400 MG-UNIT TABLET    Take 1 tablet by mouth daily.   CYANOCOBALAMIN (,VITAMIN B-12,) 1000 MCG/ML INJECTION    Inject 1 mL (1,000 mcg total) into the muscle every 30 (thirty) days.  Pt uses on the 1st of every month.   FENTANYL (DURAGESIC) 50 MCG/HR    Place 1 patch onto the skin every 3 (three) days.   FENTANYL 62.5 MCG/HR PT72    Place 1 patch onto the skin every 3 (three) days.   FUROSEMIDE  (LASIX) 20 MG TABLET    TAKE 1 TABLET DAILY AS NEEDED FOR FLUID RETENTION   HYDROXYCHLOROQUINE (PLAQUENIL) 200 MG TABLET    Take 200 mg by mouth daily.   LETROZOLE (FEMARA) 2.5 MG TABLET    TAKE 1 TABLET BY MOUTH EVERY DAY   LEVOTHYROXINE (SYNTHROID) 112 MCG TABLET    Take 1 tablet (112 mcg total) by mouth daily. NAME BRAND ONLY SYNTHROID PLEASE  NOTE CURRENT DOSE   LOSARTAN (COZAAR) 25 MG TABLET    TAKE 1 TABLET BY MOUTH EVERYDAY AT BEDTIME   METOCLOPRAMIDE (REGLAN) 10 MG TABLET    TAKE 1 TABLET BY MOUTH 4 TIMES DAILY - BEFORE MEALS AND AT BEDTIME.   METOPROLOL SUCCINATE (TOPROL-XL) 25 MG 24 HR TABLET    Take 12.5 mg by mouth daily.    MUPIROCIN OINTMENT (BACTROBAN) 2 %    Apply 1 application topically 2 (two) times daily. Apply thin layer to affected area on leg   MYCOPHENOLATE (CELLCEPT) 500 MG TABLET    Take 1,000 mg by mouth 2 (two) times daily.   ONDANSETRON (ZOFRAN ODT) 4 MG DISINTEGRATING TABLET    Take 1 tablet (4 mg total) by mouth every 8 (eight) hours as needed for nausea or vomiting.   OXYCODONE-ACETAMINOPHEN (PERCOCET/ROXICET) 5-325 MG TABLET    Take 1 tablet by mouth 2 (two) times daily as needed for severe pain.   OXYGEN    Inhale 2 mLs into the lungs at bedtime.   PANTOPRAZOLE (PROTONIX) 40 MG TABLET    Take 40 mg by mouth 2 (two) times daily.   POLYVINYL ALCOHOL-POVIDONE (REFRESH OP)    Place 2 drops into both eyes daily as needed (dry eyes).    PREDNISONE (DELTASONE) 5 MG TABLET    Take 5 mg by mouth daily.    PROCHLORPERAZINE (COMPAZINE) 5 MG TABLET    Take 1 tablet (5 mg total) by mouth every 6 (six) hours as needed for nausea or vomiting.   SACCHAROMYCES BOULARDII (PROBIOTIC) 250 MG CAPS    Take 1 tablet by mouth daily.   SUCRALFATE (CARAFATE) 1 GM/10ML SUSPENSION    Take 1 g by mouth 4 (four) times daily. Before meals and nightly   SYRINGE/NEEDLE, DISP, (SYRINGE 3CC/25GX1") 25G X 1" 3 ML MISC    Use for b12 injections   TADALAFIL, PAH, (ADCIRCA) 20 MG TABS    Take 40 mg by  mouth daily.   WARFARIN (COUMADIN) 2 MG TABLET    Take 1 tablet (2 mg total) by mouth daily.   WARFARIN (COUMADIN) 6 MG TABLET    TAKE 1 TABLET (6 MG TOTAL) BY MOUTH DAILY. AS DIRECTED. PATIENT NEEDS TODAY  Modified Medications   No medications on file  Discontinued Medications   No medications on file    Allergies: Allergies  Allergen Reactions  . Iodinated Diagnostic Agents Hives    Pt states she broke out in hives years ago, has been premedicated in the past.   . Tramadol Rash       . Amoxicillin Other (See Comments)    Reaction:  Stomach cramps   . Cefuroxime Itching  . Metrizamide Hives  . Morphine And Related     Reaction: face  flushed    . Propoxyphene     Stomach cramp  . Adhesive [Tape] Rash    Silk tape causes red skin  . Cephalexin Rash  . Sulfa Antibiotics Rash  . Sulfonamide Derivatives Rash  . Venlafaxine Rash    Past Medical History: Past Medical History:  Diagnosis Date  . Acute glomerulonephritis with other specified pathological lesion in kidney in disease classified elsewhere(580.81)   . Alveolar aeration decreased   . Anemia   . Arthritis   . Atrial fibrillation ([place])   . Breast cancer ([place]) 06/03/2014   positive, radiation  . Cancer (HCC)    Breast  . CHF (congestive heart failure) ([place])   . Diaphragmatic hernia   . DVT (deep venous thrombosis) ([place])   . Dysrhythmia   . Environmental allergies   . Erosive esophagitis 03/28/2015   esophageal biopsy July 2017 c/w CMV cytopathic changes, negative for H Pylori and dysplasia     . Esophageal reflux   . Esophagitis, CMV ([place]) 05/26/2015   Diagnosed at [facility] with manometry,  Tube feeds started for enteral nutrtion 05/12/15   . Feeding problem    FEEDING TUBE  . Hiatal hernia   . Hyperlipidemia   . Hypertension   . Lower extremity edema   . Lupus (systemic lupus erythematosus) ([place])   . Neuropathy   . Osteopenia   . Oxygen deficiency    USES HS  . Peripheral neuropathy, hereditary/idiopathic   .  Personal history of radiation therapy   . Pneumonia   . Primary pulmonary hypertension ([place])   . Renal insufficiency   . Sciatica   . Shingles   . Shingles (herpes zoster) polyneuropathy March 2013   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  . Shortness of breath dyspnea   . Unspecified hypothyroidism   . Unspecified menopausal and postmenopausal disorder   . Urinary incontinence without sensory awareness   . Vitamin B deficiency     Social History: Social History   Socioeconomic History  . Marital status: Divorced    Spouse name: Not on file  . Number of children: Not on file  . Years of education: Not on file  . Highest education level: Not on file  Occupational History  . 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 Financial resource strain: Not hard at all  . Food insecurity    Worry: Never true    Inability: Never true  . Transportation needs    Medical: No    Non-medical: No  Tobacco Use  . Smoking status: Former Smoker    Quit date: 05/25/1991    Years since quitting: 27.9  . Smokeless tobacco: Never Used  Substance and Sexual Activity  . Alcohol use: No    Frequency: Never    Comment: rare  . Drug use: No  . Sexual activity: Not on file  Lifestyle  . Physical activity    Days per week: 0 days    Minutes per session: 0 min  . Stress: Not at all  Relationships  . Social Herbalist on phone: Patient refused    Gets together: Patient refused    Attends religious service: Patient refused    Active member of club or organization: Patient refused    Attends meetings of clubs or organizations: Patient refused    Relationship status: Divorced  Other Topics Concern  . Not on file  Social History Narrative  . Not on file  Labs: No results found for: HIV1RNAQUANT, HIV1RNAVL, CD4TABS  RPR and STI No results found for: LABRPR, RPRTITER  No flowsheet data found.  Hepatitis B No results found for: HEPBSAB,  HEPBSAG, HEPBCAB Hepatitis C No results found for: HEPCAB, HCVRNAPCRQN Hepatitis A No results found for: HAV Lipids: Lab Results  Component Value Date   CHOL 164 10/11/2013   TRIG 152.0 (H) 10/11/2013   HDL 69.70 10/11/2013   CHOLHDL 2 10/11/2013   VLDL 30.4 10/11/2013   LDLCALC 64 10/11/2013    Assessment: Diana [name] is here today to see Dr. Linus Salmons for her CMV infection.  She was seen here previously for the same infection and took Valcyte at that time.  She tells me that she had to pay around $500 out of pocket for her previous prescription. She [facility] get her [facility] per her [employer]. Based on her age, weight, and creatinine from July, her CrCl is around 18 ml/min, so she will need to be on 450 mg every other day. Discussed this with her and also advised her to take it with meals. Inez Catalina and I will follow-up on her copay and see if there are any assistance funds open for her disease. Will follow up with her tomorrow.   Plan: - Valcyte 450 mg PO q48hr - Will f/u about copay   Iyanna Drummer L. Marvel[place], PharmD, BCIDP, AAHIVP, [facility] 05/06/2019, 3:54 PM

## 2019-05-06 NOTE — Progress Notes (Addendum)
Velcro Wraps- bilateral 1. I am in a suggest that we go ahead and initiate treatment with a Hydrofera Blue dressing which hopefully will do better for her at both locations. 2. I am going recommend that we continue with the Tubigrip for this week and then subsequently we will get a go ahead and measure her for the Velcro compression wraps this will be sent to the medical supply company for her. I believe this is a much better long-term goal as far as trying to keep some of her swelling under control in preventing issues like what we are seeing currently. 3. I recommend she continue to elevate her legs as much as possible. We will see patient back for reevaluation in 1 week here in the clinic. If anything worsens or changes patient will contact our office for additional recommendations. Electronic Signature(s) Signed: 05/08/2019 7:19:24 PM By: Worthy Keeler PA-C Previous Signature: 05/06/2019 1:06:50 PM  Version By: Worthy Keeler PA-C Entered By: Worthy Keeler on 05/08/2019 19:19:24 Kalbfleisch, [name] ([id number]) -------------------------------------------------------------------------------- ROS/PFSH Details Patient Name: Diana [name]. Date of Service: 05/06/2019 12:30 PM Medical Record Number: [id number] Patient Account Number: [id number] Date of Birth/Sex: 25-Sep-1934 (84 y.o. F) Treating RN: Harold Barban Primary Care Provider: Deborra Medina Other Clinician: Referring Provider: Deborra Medina Treating Provider/Extender: Melburn Hake, HOYT Weeks in Treatment: 1 Information Obtained From Patient Constitutional Symptoms (General Health) Complaints and Symptoms: Negative for: Fatigue; Fever; Chills; Marked Weight Change Medical History: Past Medical History Notes: Lupus (Lungs and Kidneys), DM II, HTN, CHF, Hypo thyroidism Respiratory Complaints and Symptoms: Negative for: Chronic or frequent coughs; Shortness of Breath Cardiovascular Complaints and Symptoms: Positive for: LE edema Negative for: Chest pain Medical History: Positive for: Congestive Heart Failure Negative for: Hypertension Psychiatric Complaints and Symptoms: Negative for: Anxiety; Claustrophobia Eyes Medical History: Positive for: Cataracts - removed Ear/Nose/Mouth/Throat Medical History: Positive for: Chronic sinus problems/congestion Hematologic/Lymphatic Medical History: Positive for: Anemia - Injections every 3 weeks Endocrine Medical History: Positive for: Type II Diabetes - no medication taken SOLEIL, MAS [name]. ([id number]) Time with diabetes: Not sure Genitourinary Medical History: Positive for: End Stage Renal Disease - CKD3 Immunological Medical History: Positive for: Lupus Erythematosus Musculoskeletal Medical History: Past Medical History Notes: Pain in hip Neurologic Medical History: Positive for: Neuropathy Oncologic Medical History: Positive for: Received  Radiation Negative for: Received Chemotherapy HBO Extended History Items Eyes: Ear/Nose/Mouth/Throat: Cataracts Chronic sinus problems/congestion Immunizations Pneumococcal Vaccine: Received Pneumococcal Vaccination: No Implantable Devices None Family and Social History Cancer: Yes - Siblings; Diabetes: No; Heart Disease: Yes - Mother; Hypertension: No; Kidney Disease: No; Lung Disease: No; Seizures: No; Stroke: No; Thyroid Problems: No; Tuberculosis: Yes - Mother; Former smoker - Quit 40 years ago; Marital Status - Divorced; Alcohol Use: Rarely; Drug Use: No History; Caffeine Use: Moderate Physician Affirmation I have reviewed and agree with the above information. Electronic Signature(s) Signed: 05/06/2019 1:13:20 PM By: Worthy Keeler PA-C Signed: 05/06/2019 4:31:36 PM By: Harold Barban Entered By: Worthy Keeler on 05/06/2019 13:05:31 Reinitz, Shanan BMarland Kitchen ([id number]) -------------------------------------------------------------------------------- SuperBill Details Patient Name: Fabro, Ayat [name]. Date of Service: 05/06/2019 Medical Record Number: [id number] Patient Account Number: [id number] Date of Birth/Sex: 1935-02-03 (84 y.o. F) Treating RN: Harold Barban Primary Care Provider: Deborra Medina Other Clinician: Referring Provider: Deborra Medina Treating Provider/Extender: Melburn Hake, HOYT Weeks in Treatment: 1 Diagnosis Coding ICD-10 Codes Code Description I89.0 Lymphedema, not elsewhere classified L97.812 Non-pressure chronic ulcer of other part of right lower leg with fat layer exposed L97.822 Non-pressure chronic ulcer  of systemic lupus erythematosus 04/29/2019 No Yes Inactive Problems Tackitt, Revonda [name]. ([id number]) Resolved Problems Electronic Signature(s) Signed: 05/06/2019 12:50:58 PM By: Worthy Keeler PA-C Entered By: Worthy Keeler on 05/06/2019 12:50:58 Canning, Cortnee [name]. ([id number]) -------------------------------------------------------------------------------- Progress Note Details Patient Name: Salvia, Diana [name]. Date of Service: 05/06/2019 12:30 PM Medical Record Number: [id number] Patient Account Number: [id number] Date of Birth/Sex: Jul 04, 1935 (84 y.o. F) Treating RN: Harold Barban Primary Care Provider: Deborra Medina Other Clinician: Referring Provider: Deborra Medina Treating Provider/Extender: Melburn Hake, HOYT Weeks in Treatment: 1 Subjective Chief Complaint Information obtained from Patient Bilateral LE Ulcers and Severe Bilateral Lymphedema History of Present Illness (HPI) 04/29/2019 upon evaluation today patient presents for initial inspection her clinic concerning issues that she has been having with bilateral lower extremity edema for quite some time. She has unfortunately an extensive medical history which includes lymphedema, congestive heart failure, chronic kidney disease stage III which is trending towards stage IV, iron deficiency anemia, hypertension, and lupus. She  is also on long-term anticoagulation therapy with Coumadin as well as Lasix. With that being said currently today she did have an injury where she sustained a cut to the back of her right leg which apparently there was a very difficult time getting this to completely healed. Fortunately it is still healing and seems to be doing okay nonetheless she had a lot of bleeding and again that is what they really had a sufficiently difficult time getting a stop when she was seen in the emergency department. Fortunately there is no signs of active infection at this time which is good news. She states that Dr. Derrel Nip her primary care provider sent her here today for compression therapy. That is where the majority of our conversation was directed today. 05/06/2019 upon evaluation today patient appears to be doing very well with regard to the wounds on her bilateral lower extremities other than the fact that they seem to be somewhat dry with some of the Prisma sticking to the wound bed. With that being said think that she may do better with a different type of dressing to try to help this heal more appropriately and effectively. Fortunately there is no signs of active infection at this time she does have some discomfort however despite the fact that the wounds are not looking too poorly. Patient History Information obtained from Patient. Family History Cancer - Siblings, Heart Disease - Mother, Tuberculosis - Mother, No family history of Diabetes, Hypertension, Kidney Disease, Lung Disease, Seizures, Stroke, Thyroid Problems. Social History Former smoker - Quit 40 years ago, Marital Status - Divorced, Alcohol Use - Rarely, Drug Use - No History, Caffeine Use - Moderate. Medical History Eyes Patient has history of Cataracts - removed Ear/Nose/Mouth/Throat Patient has history of Chronic sinus problems/congestion Hematologic/Lymphatic Patient has history of Anemia - Injections every 3  weeks Cardiovascular Patient has history of Congestive Heart Failure Denies history of Hypertension Endocrine Patient has history of Type II Diabetes - no medication taken Chavous, Kiosha [name]. ([id number]) Genitourinary Patient has history of End Stage Renal Disease - CKD3 Immunological Patient has history of Lupus Erythematosus Neurologic Patient has history of Neuropathy Oncologic Patient has history of Received Radiation Denies history of Received Chemotherapy Medical And Surgical History Notes Constitutional Symptoms (General Health) Lupus (Lungs and Kidneys), DM II, HTN, CHF, Hypo thyroidism Musculoskeletal Pain in hip Review of Systems (ROS) Constitutional Symptoms (General Health) Denies complaints or symptoms of Fatigue, Fever, Chills, Marked Weight Change. Respiratory Denies complaints or symptoms of Chronic or frequent  to the surface of the wound unfortunately. I do believe that she is a candidate for switching to a Hydrofera Blue dressing which may be better for her no sharp debridement was performed today as I did not want to risk bleeding that we could not get under control also did not want to make anything worse from the standpoint of her discomfort and pain she was already having discomfort. Fortunately there is no signs of infection. Electronic Signature(s) Signed: 05/06/2019 1:05:49 PM By: Worthy Keeler PA-C Entered By: Worthy Keeler on 05/06/2019 13:05:48 Lichter, [name] ([id number]) -------------------------------------------------------------------------------- Physician Orders Details Patient Name: Diana [name]. Date of Service: 05/06/2019 12:30 PM Medical Record Number: [id number] Patient Account Number: [id number] Date of Birth/Sex: 1935-08-29 (84 y.o. F) Treating RN: Harold Barban Primary Care Provider: Deborra Medina Other Clinician: Referring Provider: Deborra Medina Treating Provider/Extender: Melburn Hake, HOYT Weeks in Treatment: 1 Verbal / Phone Orders: No Diagnosis Coding ICD-10 Coding Code Description I89.0 Lymphedema, not elsewhere classified L97.812 Non-pressure chronic ulcer of other part of right lower leg with fat layer exposed L97.822 Non-pressure chronic ulcer of other part of left lower leg with fat layer exposed I50.42 Chronic combined systolic (congestive) and diastolic (congestive) heart failure N18.3 Chronic kidney disease, stage 3 (moderate) D50.8 Other iron deficiency anemias I10 Essential (primary) hypertension Z79.01 Long term (current) use of anticoagulants M32.8 Other forms of  systemic lupus erythematosus Wound Cleansing Wound #1 Left,Anterior Lower Leg o Dial antibacterial soap, wash wounds, rinse and pat dry prior to dressing wounds Wound #2 Right,Lateral Lower Leg o Dial antibacterial soap, wash wounds, rinse and pat dry prior to dressing wounds Skin Barriers/Peri-Wound Care Wound #1 Left,Anterior Lower Leg o Barrier cream - Zinc oxide Wound #2 Right,Lateral Lower Leg o Barrier cream - Zinc oxide Primary Wound Dressing Wound #1 Left,Anterior Lower Leg o Hydrafera Blue Ready Transfer Wound #2 Right,Lateral Lower Leg o Hydrafera Blue Ready Transfer Secondary Dressing Wound #1 Left,Anterior Lower Leg o ABD pad Wound #2 Right,Lateral Lower Leg o ABD pad Harner, Chace [name]. ([id number]) Dressing Change Frequency Wound #1 Left,Anterior Lower Leg o Change Dressing Monday, Wednesday, Friday - Mondays-dressing changes in clinic. Wednesdays and Fridays- [facility] will do dressing changes Wound #2 Right,Lateral Lower Leg o Change Dressing Monday, Wednesday, Friday - Mondays-dressing changes in clinic. Wednesdays and Fridays- [facility] [employer] will do dressing changes Follow-up Appointments Wound #1 Left,Anterior Lower Leg o Return Appointment in 1 week. Wound #2 Right,Lateral Lower Leg o Return Appointment in 1 week. Edema Control Wound #1 Left,Anterior Lower Leg o Other: - Tubi Grip F Wound #2 Right,Lateral Lower Leg o Other: - Tubi Grip F Off-Loading Wound #1 Left,Anterior Lower Leg o Other: - Elevate legs when sitting Wound #2 Right,Lateral Lower Leg o Other: - Elevate legs when sitting Home Health Wound #1 [place] for [place] Wednesday and Fridays. Pleasechange the [place] Nurse may visit PRN to address patientos wound care needs. o FACE TO FACE ENCOUNTER: MEDICARE and MEDICAID PATIENTS: I certify that  this patient is under my care and that I had a face-to-face encounter that meets the physician face-to-face encounter requirements with this patient on this date. The encounter with the patient was in whole or in part for the following MEDICAL CONDITION: (primary reason for [place]) MEDICAL NECESSITY: I certify, that based on my findings, NURSING services are a medically necessary home health service. HOME BOUND  STATUS: I certify that my clinical findings support that this patient is homebound (i.e., Due to illness or injury, pt requires aid of supportive devices such as crutches, cane, wheelchairs, walkers, the use of special transportation or the assistance of another person to leave their place of residence. There is a normal inability to leave the home and doing so requires considerable and taxing effort. Other absences are for medical reasons / religious services and are infrequent or of short duration when for other reasons). o If current dressing causes regression in wound condition, may D/C ordered dressing product/s and apply Normal Saline Moist Dressing daily until next [facility] / Other MD appointment. [place] of regression in wound condition at [phone]. o Please direct any NON-WOUND related issues/requests for orders to patient's Primary Care Physician Wound #2 [place] for [place] Wednesday and Fridays. Pleasechange the [place] Nurse may visit PRN to address patientos wound care needs. o FACE TO FACE ENCOUNTER: MEDICARE and MEDICAID PATIENTS: I certify that this patient is under my care and that I had a face-to-face encounter that meets the physician face-to-face encounter requirements with this Bise, Stevi [name]. ([id number]) patient on this date. The encounter with the patient was in whole or in part for the following  MEDICAL CONDITION: (primary reason for [place]) MEDICAL NECESSITY: I certify, that based on my findings, NURSING services are a medically necessary home health service. HOME BOUND STATUS: I certify that my clinical findings support that this patient is homebound (i.e., Due to illness or injury, pt requires aid of supportive devices such as crutches, cane, wheelchairs, walkers, the use of special transportation or the assistance of another person to leave their place of residence. There is a normal inability to leave the home and doing so requires considerable and taxing effort. Other absences are for medical reasons / religious services and are infrequent or of short duration when for other reasons). o If current dressing causes regression in wound condition, may D/C ordered dressing product/s and apply Normal Saline Moist Dressing daily until next [place] / Other MD appointment. [place] of regression in wound condition at [phone]. o Please direct any NON-WOUND related issues/requests for orders to patient's Primary Care Physician Notes Order Velcro Wraps- bilateral Electronic Signature(s) Signed: 05/06/2019 1:13:20 PM By: Worthy Keeler PA-C Signed: 05/06/2019 4:31:36 PM By: Harold Barban Entered By: Harold Barban on 05/06/2019 13:06:00 Frontera, Katha BMarland Kitchen ([id number]) -------------------------------------------------------------------------------- Problem List Details Patient Name: Inman, Diana [name]. Date of Service: 05/06/2019 12:30 PM Medical Record Number: [id number] Patient Account Number: [id number] Date of Birth/Sex: 19-Feb-1935 (84 y.o. 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 other part of right lower leg 04/29/2019 No Yes with fat layer exposed L97.822 Non-pressure chronic ulcer of other part of left lower leg with 04/29/2019 No Yes fat layer exposed I50.42 Chronic combined systolic (congestive) and diastolic 9/48/5462 No Yes (congestive) heart failure N18.3 Chronic kidney disease, stage 3 (moderate) 04/29/2019 No Yes D50.8 Other iron deficiency anemias 04/29/2019 No Yes I10 Essential (primary) hypertension 04/29/2019 No Yes Z79.01 Long term (current) use of anticoagulants 04/29/2019 No Yes M32.8 Other forms  of systemic lupus erythematosus 04/29/2019 No Yes Inactive Problems Tackitt, Revonda [name]. ([id number]) Resolved Problems Electronic Signature(s) Signed: 05/06/2019 12:50:58 PM By: Worthy Keeler PA-C Entered By: Worthy Keeler on 05/06/2019 12:50:58 Canning, Cortnee [name]. ([id number]) -------------------------------------------------------------------------------- Progress Note Details Patient Name: Salvia, Diana [name]. Date of Service: 05/06/2019 12:30 PM Medical Record Number: [id number] Patient Account Number: [id number] Date of Birth/Sex: Jul 04, 1935 (84 y.o. F) Treating RN: Harold Barban Primary Care Provider: Deborra Medina Other Clinician: Referring Provider: Deborra Medina Treating Provider/Extender: Melburn Hake, HOYT Weeks in Treatment: 1 Subjective Chief Complaint Information obtained from Patient Bilateral LE Ulcers and Severe Bilateral Lymphedema History of Present Illness (HPI) 04/29/2019 upon evaluation today patient presents for initial inspection her clinic concerning issues that she has been having with bilateral lower extremity edema for quite some time. She has unfortunately an extensive medical history which includes lymphedema, congestive heart failure, chronic kidney disease stage III which is trending towards stage IV, iron deficiency anemia, hypertension, and lupus. She  is also on long-term anticoagulation therapy with Coumadin as well as Lasix. With that being said currently today she did have an injury where she sustained a cut to the back of her right leg which apparently there was a very difficult time getting this to completely healed. Fortunately it is still healing and seems to be doing okay nonetheless she had a lot of bleeding and again that is what they really had a sufficiently difficult time getting a stop when she was seen in the emergency department. Fortunately there is no signs of active infection at this time which is good news. She states that Dr. Derrel Nip her primary care provider sent her here today for compression therapy. That is where the majority of our conversation was directed today. 05/06/2019 upon evaluation today patient appears to be doing very well with regard to the wounds on her bilateral lower extremities other than the fact that they seem to be somewhat dry with some of the Prisma sticking to the wound bed. With that being said think that she may do better with a different type of dressing to try to help this heal more appropriately and effectively. Fortunately there is no signs of active infection at this time she does have some discomfort however despite the fact that the wounds are not looking too poorly. Patient History Information obtained from Patient. Family History Cancer - Siblings, Heart Disease - Mother, Tuberculosis - Mother, No family history of Diabetes, Hypertension, Kidney Disease, Lung Disease, Seizures, Stroke, Thyroid Problems. Social History Former smoker - Quit 40 years ago, Marital Status - Divorced, Alcohol Use - Rarely, Drug Use - No History, Caffeine Use - Moderate. Medical History Eyes Patient has history of Cataracts - removed Ear/Nose/Mouth/Throat Patient has history of Chronic sinus problems/congestion Hematologic/Lymphatic Patient has history of Anemia - Injections every 3  weeks Cardiovascular Patient has history of Congestive Heart Failure Denies history of Hypertension Endocrine Patient has history of Type II Diabetes - no medication taken Chavous, Kiosha [name]. ([id number]) Genitourinary Patient has history of End Stage Renal Disease - CKD3 Immunological Patient has history of Lupus Erythematosus Neurologic Patient has history of Neuropathy Oncologic Patient has history of Received Radiation Denies history of Received Chemotherapy Medical And Surgical History Notes Constitutional Symptoms (General Health) Lupus (Lungs and Kidneys), DM II, HTN, CHF, Hypo thyroidism Musculoskeletal Pain in hip Review of Systems (ROS) Constitutional Symptoms (General Health) Denies complaints or symptoms of Fatigue, Fever, Chills, Marked Weight Change. Respiratory Denies complaints or symptoms of Chronic or frequent  Velcro Wraps- bilateral 1. I am in a suggest that we go ahead and initiate treatment with a Hydrofera Blue dressing which hopefully will do better for her at both locations. 2. I am going recommend that we continue with the Tubigrip for this week and then subsequently we will get a go ahead and measure her for the Velcro compression wraps this will be sent to the medical supply company for her. I believe this is a much better long-term goal as far as trying to keep some of her swelling under control in preventing issues like what we are seeing currently. 3. I recommend she continue to elevate her legs as much as possible. We will see patient back for reevaluation in 1 week here in the clinic. If anything worsens or changes patient will contact our office for additional recommendations. Electronic Signature(s) Signed: 05/08/2019 7:19:24 PM By: Worthy Keeler PA-C Previous Signature: 05/06/2019 1:06:50 PM  Version By: Worthy Keeler PA-C Entered By: Worthy Keeler on 05/08/2019 19:19:24 Kalbfleisch, [name] ([id number]) -------------------------------------------------------------------------------- ROS/PFSH Details Patient Name: Diana [name]. Date of Service: 05/06/2019 12:30 PM Medical Record Number: [id number] Patient Account Number: [id number] Date of Birth/Sex: 25-Sep-1934 (84 y.o. F) Treating RN: Harold Barban Primary Care Provider: Deborra Medina Other Clinician: Referring Provider: Deborra Medina Treating Provider/Extender: Melburn Hake, HOYT Weeks in Treatment: 1 Information Obtained From Patient Constitutional Symptoms (General Health) Complaints and Symptoms: Negative for: Fatigue; Fever; Chills; Marked Weight Change Medical History: Past Medical History Notes: Lupus (Lungs and Kidneys), DM II, HTN, CHF, Hypo thyroidism Respiratory Complaints and Symptoms: Negative for: Chronic or frequent coughs; Shortness of Breath Cardiovascular Complaints and Symptoms: Positive for: LE edema Negative for: Chest pain Medical History: Positive for: Congestive Heart Failure Negative for: Hypertension Psychiatric Complaints and Symptoms: Negative for: Anxiety; Claustrophobia Eyes Medical History: Positive for: Cataracts - removed Ear/Nose/Mouth/Throat Medical History: Positive for: Chronic sinus problems/congestion Hematologic/Lymphatic Medical History: Positive for: Anemia - Injections every 3 weeks Endocrine Medical History: Positive for: Type II Diabetes - no medication taken SOLEIL, MAS [name]. ([id number]) Time with diabetes: Not sure Genitourinary Medical History: Positive for: End Stage Renal Disease - CKD3 Immunological Medical History: Positive for: Lupus Erythematosus Musculoskeletal Medical History: Past Medical History Notes: Pain in hip Neurologic Medical History: Positive for: Neuropathy Oncologic Medical History: Positive for: Received  Radiation Negative for: Received Chemotherapy HBO Extended History Items Eyes: Ear/Nose/Mouth/Throat: Cataracts Chronic sinus problems/congestion Immunizations Pneumococcal Vaccine: Received Pneumococcal Vaccination: No Implantable Devices None Family and Social History Cancer: Yes - Siblings; Diabetes: No; Heart Disease: Yes - Mother; Hypertension: No; Kidney Disease: No; Lung Disease: No; Seizures: No; Stroke: No; Thyroid Problems: No; Tuberculosis: Yes - Mother; Former smoker - Quit 40 years ago; Marital Status - Divorced; Alcohol Use: Rarely; Drug Use: No History; Caffeine Use: Moderate Physician Affirmation I have reviewed and agree with the above information. Electronic Signature(s) Signed: 05/06/2019 1:13:20 PM By: Worthy Keeler PA-C Signed: 05/06/2019 4:31:36 PM By: Harold Barban Entered By: Worthy Keeler on 05/06/2019 13:05:31 Reinitz, Shanan BMarland Kitchen ([id number]) -------------------------------------------------------------------------------- SuperBill Details Patient Name: Fabro, Ayat [name]. Date of Service: 05/06/2019 Medical Record Number: [id number] Patient Account Number: [id number] Date of Birth/Sex: 1935-02-03 (84 y.o. F) Treating RN: Harold Barban Primary Care Provider: Deborra Medina Other Clinician: Referring Provider: Deborra Medina Treating Provider/Extender: Melburn Hake, HOYT Weeks in Treatment: 1 Diagnosis Coding ICD-10 Codes Code Description I89.0 Lymphedema, not elsewhere classified L97.812 Non-pressure chronic ulcer of other part of right lower leg with fat layer exposed L97.822 Non-pressure chronic ulcer  STATUS: I certify that my clinical findings support that this patient is homebound (i.e., Due to illness or injury, pt requires aid of supportive devices such as crutches, cane, wheelchairs, walkers, the use of special transportation or the assistance of another person to leave their place of residence. There is a normal inability to leave the home and doing so requires considerable and taxing effort. Other absences are for medical reasons / religious services and are infrequent or of short duration when for other reasons). o If current dressing causes regression in wound condition, may D/C ordered dressing product/s and apply Normal Saline Moist Dressing daily until next [facility] / Other MD appointment. [place] of regression in wound condition at [phone]. o Please direct any NON-WOUND related issues/requests for orders to patient's Primary Care Physician Wound #2 [place] for [place] Wednesday and Fridays. Pleasechange the [facility] Nurse may visit PRN to address patientos wound care needs. o FACE TO FACE ENCOUNTER: MEDICARE and MEDICAID PATIENTS: I certify that this patient is under my care and that I had a face-to-face encounter that meets the physician face-to-face encounter requirements with this Bise, Stevi [name]. ([id number]) patient on this date. The encounter with the patient was in whole or in part for the following  MEDICAL CONDITION: (primary reason for Webb) MEDICAL NECESSITY: I certify, that based on my findings, NURSING services are a medically necessary home health service. HOME BOUND STATUS: I certify that my clinical findings support that this patient is homebound (i.e., Due to illness or injury, pt requires aid of supportive devices such as crutches, cane, wheelchairs, walkers, the use of special transportation or the assistance of another person to leave their place of residence. There is a normal inability to leave the home and doing so requires considerable and taxing effort. Other absences are for medical reasons / religious services and are infrequent or of short duration when for other reasons). o If current dressing causes regression in wound condition, may D/C ordered dressing product/s and apply Normal Saline Moist Dressing daily until next [place] / Other MD appointment. [place] of regression in wound condition at [phone]. o Please direct any NON-WOUND related issues/requests for orders to patient's Primary Care Physician Notes Order Velcro Wraps- bilateral Electronic Signature(s) Signed: 05/06/2019 1:13:20 PM By: Worthy Keeler PA-C Signed: 05/06/2019 4:31:36 PM By: Harold Barban Entered By: Harold Barban on 05/06/2019 13:06:00 Frontera, Katha BMarland Kitchen ([id number]) -------------------------------------------------------------------------------- Problem List Details Patient Name: Inman, Diana [name]. Date of Service: 05/06/2019 12:30 PM Medical Record Number: [id number] Patient Account Number: [id number] Date of Birth/Sex: 19-Feb-1935 (84 y.o. 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 other part of right lower leg 04/29/2019 No Yes with fat layer exposed L97.822 Non-pressure chronic ulcer of other part of left lower leg with 04/29/2019 No Yes fat layer exposed I50.42 Chronic combined systolic (congestive) and diastolic 9/48/5462 No Yes (congestive) heart failure N18.3 Chronic kidney disease, stage 3 (moderate) 04/29/2019 No Yes D50.8 Other iron deficiency anemias 04/29/2019 No Yes I10 Essential (primary) hypertension 04/29/2019 No Yes Z79.01 Long term (current) use of anticoagulants 04/29/2019 No Yes M32.8 Other forms  STATUS: I certify that my clinical findings support that this patient is homebound (i.e., Due to illness or injury, pt requires aid of supportive devices such as crutches, cane, wheelchairs, walkers, the use of special transportation or the assistance of another person to leave their place of residence. There is a normal inability to leave the home and doing so requires considerable and taxing effort. Other absences are for medical reasons / religious services and are infrequent or of short duration when for other reasons). o If current dressing causes regression in wound condition, may D/C ordered dressing product/s and apply Normal Saline Moist Dressing daily until next [facility] / Other MD appointment. [place] of regression in wound condition at [phone]. o Please direct any NON-WOUND related issues/requests for orders to patient's Primary Care Physician Wound #2 [place] for [place] Wednesday and Fridays. Pleasechange the [place] Nurse may visit PRN to address patientos wound care needs. o FACE TO FACE ENCOUNTER: MEDICARE and MEDICAID PATIENTS: I certify that this patient is under my care and that I had a face-to-face encounter that meets the physician face-to-face encounter requirements with this Bise, Stevi [name]. ([id number]) patient on this date. The encounter with the patient was in whole or in part for the following  MEDICAL CONDITION: (primary reason for Webb) MEDICAL NECESSITY: I certify, that based on my findings, NURSING services are a medically necessary home health service. HOME BOUND STATUS: I certify that my clinical findings support that this patient is homebound (i.e., Due to illness or injury, pt requires aid of supportive devices such as crutches, cane, wheelchairs, walkers, the use of special transportation or the assistance of another person to leave their place of residence. There is a normal inability to leave the home and doing so requires considerable and taxing effort. Other absences are for medical reasons / religious services and are infrequent or of short duration when for other reasons). o If current dressing causes regression in wound condition, may D/C ordered dressing product/s and apply Normal Saline Moist Dressing daily until next [place] / Other MD appointment. [place] of regression in wound condition at [phone]. o Please direct any NON-WOUND related issues/requests for orders to patient's Primary Care Physician Notes Order Velcro Wraps- bilateral Electronic Signature(s) Signed: 05/06/2019 1:13:20 PM By: Worthy Keeler PA-C Signed: 05/06/2019 4:31:36 PM By: Harold Barban Entered By: Harold Barban on 05/06/2019 13:06:00 Frontera, Katha BMarland Kitchen ([id number]) -------------------------------------------------------------------------------- Problem List Details Patient Name: Inman, Diana [name]. Date of Service: 05/06/2019 12:30 PM Medical Record Number: [id number] Patient Account Number: [id number] Date of Birth/Sex: 19-Feb-1935 (84 y.o. 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

## 2019-05-06 NOTE — Assessment & Plan Note (Signed)
Will do creat, cbc and repeat weekly. Will ask Dr. Derrel Nip to do in her office for convenience for the patient.

## 2019-05-06 NOTE — Progress Notes (Signed)
Subjective:    Patient ID: Diana [name], female    DOB: 16-Jun-1935, 84 y.o.   MRN: [id number]  HPI Tkeyah is here for reevaluation for CMV esophagitis.  I last saw her nearly 3 years ago with biopsy suggesting CMV viral cytopathic effect and confirmed with staining and I treated her then with oral/per tube valganciclovir.  She was treated for about 6 weeks with repeat EGD done by Dr. Marva Panda with persistent inflammation, though overall improvement after about 3 weeks.  She has more recently had issues with worsening dysphagia and saw Dr. Servando Snare for dilation and biopsies done and again with cytopathic effect c/w CMV infection.  She has remained on prednisone 5 mg daily.  Creat has worsened.     Review of Systems  Constitutional: Negative for fever and unexpected weight change.  Gastrointestinal: Positive for nausea. Negative for diarrhea.       Objective:   Physical Exam Constitutional:      Appearance: Normal appearance.  Eyes:     General: No scleral icterus. Cardiovascular:     Rate and Rhythm: Normal rate and regular rhythm.  Skin:    Findings: No rash.  Neurological:     Mental Status: She is alert.  Psychiatric:        Mood and Affect: Mood normal.   SH: former smoker        Assessment & Plan:

## 2019-05-07 ENCOUNTER — Telehealth: Payer: Self-pay | Admitting: Internal Medicine

## 2019-05-07 ENCOUNTER — Telehealth: Payer: Self-pay

## 2019-05-07 ENCOUNTER — Ambulatory Visit: Payer: Self-pay | Admitting: *Deleted

## 2019-05-07 ENCOUNTER — Telehealth: Payer: Self-pay | Admitting: Pharmacy Technician

## 2019-05-07 NOTE — Telephone Encounter (Signed)
[facility] PATIENT [facility] ENCOUNTER  I reached Ms. Delavega by phone to let her know that the Valganciclovir prescription was sent to [facility], [place] yesterday.  I called them and they ordered it to arrive today.  The copay will be $36.93.  She was appreciative and will pick up the medication.  Venida Jarvis. Nadara Mustard [place] Patient [facility] Phone: [phone] Fax:  [phone]

## 2019-05-07 NOTE — Telephone Encounter (Signed)
Patient and [facility] notified and voiced understanding.

## 2019-05-07 NOTE — Telephone Encounter (Signed)
Suspend the losartan 25 mg and metoprolol until systolic is > 90.  Once systolic is > 90,  Resume the metoprolol 12.5 mg first because without it her pulse will likely go up to > 100

## 2019-05-07 NOTE — Telephone Encounter (Signed)
Clarnce Flock, RN from [facility] calls stating that the pt's BP is low: 83/50  at1140; 85/53 at 1150; 85/53 at 1210; the pt has no dizziness or shakiness; she also had percocet (1 tablet) at 0800 and (1 tablet) at1100; she has not taken her BP medication (last dose 8/17 at 1230); the pt also has a fentanyl patch in place; [facility] [place] says that the pt does not drink enough; she would advisement if pt should take BP medication; the pt sees Dr Derrel Nip, [facility]; pt transferred to [facility] for final disposition by Dr Derrel Nip.   Reason for Disposition . [6] Fall in systolic BP > 20 mm Hg from normal AND [2] NOT dizzy, lightheaded, or weak . [7] Systolic BP < 90 AND [0] NOT dizzy, lightheaded or weak  Answer Assessment - Initial Assessment Questions 1. BLOOD PRESSURE: "What is the blood pressure?" "Did you take at least two measurements 5 minutes apart?"     83/50, and 85/53 x 2 2. ONSET: "When did you take your blood pressure?"     05/07/2019 at 1140, 1150, and 1210 3. HOW: "How did you obtain the blood pressure?" (e.g., visiting nurse, automatic home BP monitor)     Home health nurse cuff 4. HISTORY: "Do you have a history of low blood pressure?" "What is your blood pressure normally?"     SBP Has been in 90s 5. MEDICATIONS: "Are you taking any medications for blood pressure?" If yes: "Have they been changed recently?"     Yes, no chanes 6. PULSE RATE: "Do you know what your pulse rate is?"      90's 7. OTHER SYMPTOMS: "Have you been sick recently?" "Have you had a recent injury?"     no 8. PREGNANCY: "Is there any chance you are pregnant?" "When was your last menstrual period?"     no  Protocols used: LOW BLOOD PRESSURE-A-AH

## 2019-05-07 NOTE — Telephone Encounter (Signed)
Thank you Inez Catalina! I will cc Dr. Linus Salmons so he knows it is taken care of.

## 2019-05-07 NOTE — Telephone Encounter (Signed)
Janelle (physical therapy assistant with [facility]) called to let Dr Derrel Nip know that when pts vitals were taken today her BP was 85/53 and pulse was 98. Pt is asymptomatic. Feels fine. She has on a 50 mg fentanyl patch that was placed Sunday. She took one percocet 5-325 at 8am and again at 10am. She has not taken her blood pressure medication since yesterday about noon. I spoke with Janelle and then spoke with patient directly to confirm she was feeling ok. Pts BP is usually on the lower end. Was 100/58 at last OV. I have advised pt to hold her BP medication until this message has been reviewed by Dr Derrel Nip.

## 2019-05-08 LAB — POCT INR: INR: 3.5 — AB (ref 0.9–1.1)

## 2019-05-08 NOTE — Progress Notes (Addendum)
End Stage Type II Diabetes, End Stage Renal Disease, Lupus Renal Disease, Lupus Erythematosus, Neuropathy, Erythematosus, Neuropathy, Received Radiation Received Radiation Date Acquired: 03/18/2019 04/26/2019 N/A Weeks of Treatment: 1 1 N/A Wound Status: Open Open N/A Measurements L x W x D 2.1x1.6x0.1 1.5x0.5x0.3 N/A (cm) Area (cm) : 2.639 0.589 N/A Volume (cm) : 0.264 0.177 N/A % Reduction in Area: 25.00% 6.20% N/A % Reduction in Volume: 25.00% 5.90% N/A Classification: Full Thickness Without Full Thickness Without N/A Exposed Support Structures Exposed Support Structures Exudate Amount: Medium Medium N/A Exudate Type: Serous Serous N/A Exudate Color: amber amber N/A Wound Margin: Indistinct, nonvisible Flat and Intact N/A Granulation Amount: None Present (0%) Large (67-100%) N/A Granulation Quality: N/A Pink N/A Pardini, Diana [name]. ([id number]) Necrotic Amount: Large (67-100%) None Present (0%) N/A Necrotic Tissue: Eschar N/A N/A Exposed Structures: Fat Layer (Subcutaneous Fat Layer (Subcutaneous N/A Tissue) Exposed: Yes Tissue) Exposed: Yes Fascia: No Fascia: No Tendon: No Tendon: No Muscle: No Muscle: No Joint: No Joint: No Bone: No Bone: No Epithelialization: Small (1-33%) None  N/A Treatment Notes Electronic Signature(s) Signed: 05/06/2019 4:31:36 PM By: Harold Barban Entered By: Harold Barban on 05/06/2019 12:56:23 Diana [name], Diana [name] ([id number]) -------------------------------------------------------------------------------- [place] Details Patient Name: Diana [name]. Date of Service: 05/06/2019 12:30 PM Medical Record Number: [id number] Patient Account Number: [id number] Date of Birth/Sex: 11-25-1934 (84 y.o. F) Treating RN: Harold Barban Primary Care Crystalee Ventress: Deborra Medina Other Clinician: Referring Desare Duddy: Deborra Medina Treating Louvenia Golomb/Extender: Melburn Hake, HOYT Weeks in Treatment: 1 Active Inactive Wound/Skin Impairment Nursing Diagnoses: Impaired tissue integrity Goals: Ulcer/skin breakdown will have a volume reduction of 30% by week 4 Date Initiated: 04/29/2019 Target Resolution Date: 05/30/2019 Goal Status: Active Interventions: Assess patient/caregiver ability to obtain necessary supplies Assess patient/caregiver ability to perform ulcer/skin care regimen upon admission and as needed Assess ulceration(s) every visit Notes: Electronic Signature(s) Signed: 05/06/2019 4:31:36 PM By: Harold Barban Entered By: Harold Barban on 05/06/2019 12:56:14 Diana [name], Diana [name]. ([id number]) -------------------------------------------------------------------------------- Pain Assessment Details Patient Name: Diana [name], Diana [name]. Date of Service: 05/06/2019 12:30 PM Medical Record Number: [id number] Patient Account Number: [id number] Date of Birth/Sex: 13-Sep-1935 (84 y.o. F) Treating RN: Harold Barban Primary Care Porche Steinberger: Deborra Medina Other Clinician: Referring Amri Lien: Deborra Medina Treating Ayven Pheasant/Extender: Melburn Hake, HOYT Weeks in Treatment: 1 Active Problems Location of Pain Severity and Description of Pain Patient Has Paino No Site Locations Pain Management and Medication Current Pain Management: Electronic  Signature(s) Signed: 05/06/2019 4:01:11 PM By: Paulla Fore, RRT, CHT Signed: 05/06/2019 4:31:36 PM By: Harold Barban Entered By: Lorine Bears on 05/06/2019 12:36:05 Diana [name], Diana [name] ([id number]) -------------------------------------------------------------------------------- Patient/Caregiver Education Details Patient Name: Diana [name]. Date of Service: 05/06/2019 12:30 PM Medical Record Number: [id number] Patient Account Number: [id number] Date of Birth/Gender: 06/21/1935 (84 y.o. F) Treating RN: Harold Barban Primary Care Physician: Deborra Medina Other Clinician: Referring Physician: Deborra Medina Treating Physician/Extender: Melburn Hake, HOYT Weeks in Treatment: 1 Education Assessment Education Provided To: Patient Education Topics Provided Wound/Skin Impairment: Handouts: Caring for Your Ulcer Methods: Demonstration, Explain/Verbal Responses: State content correctly Electronic Signature(s) Signed: 05/06/2019 4:31:36 PM By: Harold Barban Entered By: Harold Barban on 05/06/2019 12:56:40 Diana [name], Diana [name] Kitchen ([id number]) -------------------------------------------------------------------------------- Wound Assessment Details Patient Name: Diana [name], Diana [name]. Date of Service: 05/06/2019 12:30 PM Medical Record Number: [id number] Patient Account Number: [id number] Date of Birth/Sex: 05/27/1935 (84 y.o. F) Treating RN: Montey Hora Primary Care Romar Woodrick: Deborra Medina Other Clinician: Referring Ita Fritzsche: Deborra Medina Treating Adasia Hoar/Extender: STONE III, HOYT Weeks in Treatment: 1 Wound Status Wound  End Stage Type II Diabetes, End Stage Renal Disease, Lupus Renal Disease, Lupus Erythematosus, Neuropathy, Erythematosus, Neuropathy, Received Radiation Received Radiation Date Acquired: 03/18/2019 04/26/2019 N/A Weeks of Treatment: 1 1 N/A Wound Status: Open Open N/A Measurements L x W x D 2.1x1.6x0.1 1.5x0.5x0.3 N/A (cm) Area (cm) : 2.639 0.589 N/A Volume (cm) : 0.264 0.177 N/A % Reduction in Area: 25.00% 6.20% N/A % Reduction in Volume: 25.00% 5.90% N/A Classification: Full Thickness Without Full Thickness Without N/A Exposed Support Structures Exposed Support Structures Exudate Amount: Medium Medium N/A Exudate Type: Serous Serous N/A Exudate Color: amber amber N/A Wound Margin: Indistinct, nonvisible Flat and Intact N/A Granulation Amount: None Present (0%) Large (67-100%) N/A Granulation Quality: N/A Pink N/A Pardini, Diana [name]. ([id number]) Necrotic Amount: Large (67-100%) None Present (0%) N/A Necrotic Tissue: Eschar N/A N/A Exposed Structures: Fat Layer (Subcutaneous Fat Layer (Subcutaneous N/A Tissue) Exposed: Yes Tissue) Exposed: Yes Fascia: No Fascia: No Tendon: No Tendon: No Muscle: No Muscle: No Joint: No Joint: No Bone: No Bone: No Epithelialization: Small (1-33%) None  N/A Treatment Notes Electronic Signature(s) Signed: 05/06/2019 4:31:36 PM By: Harold Barban Entered By: Harold Barban on 05/06/2019 12:56:23 Diana [name], Diana [name] ([id number]) -------------------------------------------------------------------------------- [facility] Details Patient Name: Diana [name]. Date of Service: 05/06/2019 12:30 PM Medical Record Number: [id number] Patient Account Number: [id number] Date of Birth/Sex: 11-25-1934 (84 y.o. F) Treating RN: Harold Barban Primary Care Crystalee Ventress: Deborra Medina Other Clinician: Referring Desare Duddy: Deborra Medina Treating Louvenia Golomb/Extender: Melburn Hake, HOYT Weeks in Treatment: 1 Active Inactive Wound/Skin Impairment Nursing Diagnoses: Impaired tissue integrity Goals: Ulcer/skin breakdown will have a volume reduction of 30% by week 4 Date Initiated: 04/29/2019 Target Resolution Date: 05/30/2019 Goal Status: Active Interventions: Assess patient/caregiver ability to obtain necessary supplies Assess patient/caregiver ability to perform ulcer/skin care regimen upon admission and as needed Assess ulceration(s) every visit Notes: Electronic Signature(s) Signed: 05/06/2019 4:31:36 PM By: Harold Barban Entered By: Harold Barban on 05/06/2019 12:56:14 Diana [name], Diana [name]. ([id number]) -------------------------------------------------------------------------------- Pain Assessment Details Patient Name: Diana [name], Diana [name]. Date of Service: 05/06/2019 12:30 PM Medical Record Number: [id number] Patient Account Number: [id number] Date of Birth/Sex: 13-Sep-1935 (84 y.o. F) Treating RN: Harold Barban Primary Care Porche Steinberger: Deborra Medina Other Clinician: Referring Amri Lien: Deborra Medina Treating Ayven Pheasant/Extender: Melburn Hake, HOYT Weeks in Treatment: 1 Active Problems Location of Pain Severity and Description of Pain Patient Has Paino No Site Locations Pain Management and Medication Current Pain Management: Electronic  Signature(s) Signed: 05/06/2019 4:01:11 PM By: Paulla Fore, RRT, CHT Signed: 05/06/2019 4:31:36 PM By: Harold Barban Entered By: Lorine Bears on 05/06/2019 12:36:05 Diana [name], Diana [name] ([id number]) -------------------------------------------------------------------------------- Patient/Caregiver Education Details Patient Name: Diana [name]. Date of Service: 05/06/2019 12:30 PM Medical Record Number: [id number] Patient Account Number: [id number] Date of Birth/Gender: 06/21/1935 (84 y.o. F) Treating RN: Harold Barban Primary Care Physician: Deborra Medina Other Clinician: Referring Physician: Deborra Medina Treating Physician/Extender: Melburn Hake, HOYT Weeks in Treatment: 1 Education Assessment Education Provided To: Patient Education Topics Provided Wound/Skin Impairment: Handouts: Caring for Your Ulcer Methods: Demonstration, Explain/Verbal Responses: State content correctly Electronic Signature(s) Signed: 05/06/2019 4:31:36 PM By: Harold Barban Entered By: Harold Barban on 05/06/2019 12:56:40 Diana [name], Diana [name] Kitchen ([id number]) -------------------------------------------------------------------------------- Wound Assessment Details Patient Name: Diana [name], Diana [name]. Date of Service: 05/06/2019 12:30 PM Medical Record Number: [id number] Patient Account Number: [id number] Date of Birth/Sex: 05/27/1935 (84 y.o. F) Treating RN: Montey Hora Primary Care Romar Woodrick: Deborra Medina Other Clinician: Referring Ita Fritzsche: Deborra Medina Treating Adasia Hoar/Extender: STONE III, HOYT Weeks in Treatment: 1 Wound Status Wound  End Stage Type II Diabetes, End Stage Renal Disease, Lupus Renal Disease, Lupus Erythematosus, Neuropathy, Erythematosus, Neuropathy, Received Radiation Received Radiation Date Acquired: 03/18/2019 04/26/2019 N/A Weeks of Treatment: 1 1 N/A Wound Status: Open Open N/A Measurements L x W x D 2.1x1.6x0.1 1.5x0.5x0.3 N/A (cm) Area (cm) : 2.639 0.589 N/A Volume (cm) : 0.264 0.177 N/A % Reduction in Area: 25.00% 6.20% N/A % Reduction in Volume: 25.00% 5.90% N/A Classification: Full Thickness Without Full Thickness Without N/A Exposed Support Structures Exposed Support Structures Exudate Amount: Medium Medium N/A Exudate Type: Serous Serous N/A Exudate Color: amber amber N/A Wound Margin: Indistinct, nonvisible Flat and Intact N/A Granulation Amount: None Present (0%) Large (67-100%) N/A Granulation Quality: N/A Pink N/A Pardini, Diana [name]. ([id number]) Necrotic Amount: Large (67-100%) None Present (0%) N/A Necrotic Tissue: Eschar N/A N/A Exposed Structures: Fat Layer (Subcutaneous Fat Layer (Subcutaneous N/A Tissue) Exposed: Yes Tissue) Exposed: Yes Fascia: No Fascia: No Tendon: No Tendon: No Muscle: No Muscle: No Joint: No Joint: No Bone: No Bone: No Epithelialization: Small (1-33%) None  N/A Treatment Notes Electronic Signature(s) Signed: 05/06/2019 4:31:36 PM By: Harold Barban Entered By: Harold Barban on 05/06/2019 12:56:23 Diana [name], Diana [name] ([id number]) -------------------------------------------------------------------------------- [facility] Details Patient Name: Diana [name]. Date of Service: 05/06/2019 12:30 PM Medical Record Number: [id number] Patient Account Number: [id number] Date of Birth/Sex: 11-25-1934 (84 y.o. F) Treating RN: Harold Barban Primary Care Crystalee Ventress: Deborra Medina Other Clinician: Referring Desare Duddy: Deborra Medina Treating Louvenia Golomb/Extender: Melburn Hake, HOYT Weeks in Treatment: 1 Active Inactive Wound/Skin Impairment Nursing Diagnoses: Impaired tissue integrity Goals: Ulcer/skin breakdown will have a volume reduction of 30% by week 4 Date Initiated: 04/29/2019 Target Resolution Date: 05/30/2019 Goal Status: Active Interventions: Assess patient/caregiver ability to obtain necessary supplies Assess patient/caregiver ability to perform ulcer/skin care regimen upon admission and as needed Assess ulceration(s) every visit Notes: Electronic Signature(s) Signed: 05/06/2019 4:31:36 PM By: Harold Barban Entered By: Harold Barban on 05/06/2019 12:56:14 Diana [name], Diana [name]. ([id number]) -------------------------------------------------------------------------------- Pain Assessment Details Patient Name: Diana [name], Diana [name]. Date of Service: 05/06/2019 12:30 PM Medical Record Number: [id number] Patient Account Number: [id number] Date of Birth/Sex: 13-Sep-1935 (84 y.o. F) Treating RN: Harold Barban Primary Care Porche Steinberger: Deborra Medina Other Clinician: Referring Amri Lien: Deborra Medina Treating Ayven Pheasant/Extender: Melburn Hake, HOYT Weeks in Treatment: 1 Active Problems Location of Pain Severity and Description of Pain Patient Has Paino No Site Locations Pain Management and Medication Current Pain Management: Electronic  Signature(s) Signed: 05/06/2019 4:01:11 PM By: Paulla Fore, RRT, CHT Signed: 05/06/2019 4:31:36 PM By: Harold Barban Entered By: Lorine Bears on 05/06/2019 12:36:05 Diana [name], Diana [name] ([id number]) -------------------------------------------------------------------------------- Patient/Caregiver Education Details Patient Name: Diana [name]. Date of Service: 05/06/2019 12:30 PM Medical Record Number: [id number] Patient Account Number: [id number] Date of Birth/Gender: 06/21/1935 (84 y.o. F) Treating RN: Harold Barban Primary Care Physician: Deborra Medina Other Clinician: Referring Physician: Deborra Medina Treating Physician/Extender: Melburn Hake, HOYT Weeks in Treatment: 1 Education Assessment Education Provided To: Patient Education Topics Provided Wound/Skin Impairment: Handouts: Caring for Your Ulcer Methods: Demonstration, Explain/Verbal Responses: State content correctly Electronic Signature(s) Signed: 05/06/2019 4:31:36 PM By: Harold Barban Entered By: Harold Barban on 05/06/2019 12:56:40 Diana [name], Diana [name] Kitchen ([id number]) -------------------------------------------------------------------------------- Wound Assessment Details Patient Name: Diana [name], Diana [name]. Date of Service: 05/06/2019 12:30 PM Medical Record Number: [id number] Patient Account Number: [id number] Date of Birth/Sex: 05/27/1935 (84 y.o. F) Treating RN: Montey Hora Primary Care Romar Woodrick: Deborra Medina Other Clinician: Referring Ita Fritzsche: Deborra Medina Treating Adasia Hoar/Extender: STONE III, HOYT Weeks in Treatment: 1 Wound Status Wound  1 5 Wound Imaging (photographs - any number of wounds) []  - 0 Wound Tracing (instead of photographs) X- 1 5 Simple Wound Measurement - one wound []  - 0 Complex Wound Measurement - multiple wounds INTERVENTIONS - Wound Dressings X - Small Wound Dressing one or multiple wounds 2 10 []  - 0 Medium Wound Dressing one or multiple wounds []  - 0 Large Wound Dressing one or multiple wounds X- 1 5 Application of Medications - topical []  - 0 Application of Medications - injection INTERVENTIONS - Miscellaneous []  - External ear exam 0 []  - 0 Specimen Collection (cultures, biopsies, blood, body fluids, etc.) []  - 0 Specimen(s) / Culture(s) sent or taken to Lab for analysis []  - 0 Patient Transfer (multiple staff / [employer] fast streamer / Similar devices) []  - 0 Simple Staple / Suture removal (25 or less) []  - 0 Complex Staple / Suture removal (26 or more) []  - 0 Hypo / Hyperglycemic Management (close monitor of Blood Glucose) []  - 0 Ankle / Brachial Index (ABI) - do not check if billed separately X- 1 5 Vital Signs Diana [name], Diana [name]. ([id number]) Has the patient been seen at the hospital within the last three years: Yes Total Score: 110 Level Of Care: New/Established - Level 3 Electronic Signature(s) Signed: 05/06/2019 4:31:36 PM By: Harold Barban Entered By: Harold Barban on 05/06/2019 13:01:57 Name, Diana [name] ([id number]) -------------------------------------------------------------------------------- Encounter Discharge Information Details Patient Name: Diana [name], Diana [name]. Date of Service: 05/06/2019 12:30 PM Medical Record Number: [id number] Patient Account Number: [id number] Date of Birth/Sex: [birth year] (84 y.o. F) Treating RN: Army Melia Primary Care Areliz Rothman:  Deborra Medina Other Clinician: Referring Fintan Grater: Deborra Medina Treating Arrietty Dercole/Extender: Melburn Hake, HOYT Weeks in Treatment: 1 Encounter Discharge Information Items Discharge Condition: Stable Ambulatory Status: Wheelchair Discharge Destination: Home Transportation: Private Auto Accompanied By: daughter Schedule Follow-up Appointment: Yes Clinical Summary of Care: Electronic Signature(s) Signed: 05/06/2019 2:15:02 PM By: Army Melia Entered By: Army Melia on 05/06/2019 13:18:48 Diana [name], Diana [name] ([id number]) -------------------------------------------------------------------------------- Lower Extremity Assessment Details Patient Name: Diana [name], Diana [name]. Date of Service: 05/06/2019 12:30 PM Medical Record Number: [id number] Patient Account Number: [id number] Date of Birth/Sex: 1935/01/21 (84 y.o. F) Treating RN: Montey Hora Primary Care Latisa Belay: Deborra Medina Other Clinician: Referring Loye Vento: Deborra Medina Treating Fardeen Steinberger/Extender: Melburn Hake, HOYT Weeks in Treatment: 1 Edema Assessment Assessed: [Left: No] [Right: No] Edema: [Left: Yes] [Right: Yes] Calf Left: Right: Point of Measurement: 32 cm From Medial Instep 34.5 cm 34.5 cm Ankle Left: Right: Point of Measurement: 11 cm From Medial Instep 25 cm 24 cm Vascular Assessment Pulses: Dorsalis Pedis Palpable: [Left:Yes] [Right:Yes] Electronic Signature(s) Signed: 05/07/2019 5:30:55 PM By: Montey Hora Entered By: Montey Hora on 05/06/2019 12:47:49 Diana [name], Diana [name]. ([id number]) -------------------------------------------------------------------------------- Multi Wound Chart Details Patient Name: Diana [name], Diana [name]. Date of Service: 05/06/2019 12:30 PM Medical Record Number: [id number] Patient Account Number: [id number] Date of Birth/Sex: 1935/02/19 (84 y.o. F) Treating RN: Harold Barban Primary Care Ranen Doolin: Deborra Medina Other Clinician: Referring Keatyn Jawad: Deborra Medina Treating Keyry Iracheta/Extender:  Melburn Hake, HOYT Weeks in Treatment: 1 Vital Signs Height(in): 60 Pulse(bpm): 52 Weight(lbs): 106 Blood Pressure(mmHg): 91/53 Body Mass Index(BMI): 21 Temperature(F): 98.2 Respiratory Rate 16 (breaths/min): Photos: [N/A:N/A] Wound Location: Left Lower Leg - Anterior Right Lower Leg - Lateral N/A Wounding Event: Gradually Appeared Gradually Appeared N/A Primary Etiology: Lymphedema Trauma, Other N/A Secondary Etiology: Diabetic Wound/Ulcer of the Lymphedema N/A Lower Extremity Comorbid History: Cataracts, Chronic sinus Cataracts, Chronic sinus N/A problems/congestion, Anemia, problems/congestion, Anemia, Congestive Heart Failure, Congestive Heart Failure, Type II Diabetes,

## 2019-05-09 ENCOUNTER — Telehealth: Payer: Self-pay | Admitting: *Deleted

## 2019-05-09 ENCOUNTER — Telehealth: Payer: Self-pay

## 2019-05-09 LAB — CMV (CYTOMEGALOVIRUS) DNA ULTRAQUANT, PCR
CMV DNA, QN PCR: 2.76 log IU/mL — ABNORMAL HIGH (ref <2.30)
CMV DNA, QN Real Time PCR: 579 IU/mL (ref <200)

## 2019-05-09 LAB — COMPLETE METABOLIC PANEL WITH GFR
AG Ratio: 0.5 (calc) — ABNORMAL LOW (ref 1.0–2.5)
ALT: 9 U/L (ref 6–29)
AST: 16 U/L (ref 10–35)
Albumin: 1.9 g/dL — ABNORMAL LOW (ref 3.6–5.1)
Alkaline phosphatase (APISO): 163 U/L — ABNORMAL HIGH (ref 37–153)
BUN/Creatinine Ratio: 16 (calc) (ref 6–22)
BUN: 30 mg/dL — ABNORMAL HIGH (ref 7–25)
CO2: 24 mmol/L (ref 20–32)
Calcium: 7.9 mg/dL — ABNORMAL LOW (ref 8.6–10.4)
Chloride: 113 mmol/L — ABNORMAL HIGH (ref 98–110)
Creat: 1.93 mg/dL — ABNORMAL HIGH (ref 0.60–0.88)
GFR, Est African American: 27 mL/min/{1.73_m2} — ABNORMAL LOW (ref >=60)
GFR, Est Non African American: 24 mL/min/{1.73_m2} — ABNORMAL LOW (ref >=60)
Globulin: 3.7 g/dL (calc) (ref 1.9–3.7)
Glucose, Bld: 100 mg/dL — ABNORMAL HIGH (ref 65–99)
Potassium: 4.7 mmol/L (ref 3.5–5.3)
Sodium: 142 mmol/L (ref 135–146)
Total Bilirubin: 0.3 mg/dL (ref 0.2–1.2)
Total Protein: 5.6 g/dL — ABNORMAL LOW (ref 6.1–8.1)

## 2019-05-09 MED ORDER — WARFARIN SODIUM 1 MG PO TABS: ORAL_TABLET | ORAL | 1 refills | Status: AC

## 2019-05-09 NOTE — Telephone Encounter (Signed)
Got it, thanks!

## 2019-05-09 NOTE — Telephone Encounter (Signed)
Pt's INR from 05/08/2019 is 3.5. Pt is currently taking 2mg  daily. Pt stated that she is not taking any antibiotics.   Abstracted.

## 2019-05-09 NOTE — Telephone Encounter (Signed)
[facility] called to report recent lab results for patient [id number] which is critical high. Advised will let the provider know.

## 2019-05-09 NOTE — Telephone Encounter (Signed)
I spoke with pt this morning and she stated that she is taking the 2mg  daily.

## 2019-05-09 NOTE — Telephone Encounter (Signed)
NEW REGIMEN.  2 MG ALTERNATING WITH 1 MG ,  NONE TONIGHT,  2 MG STARTIGN TOMORROW  REPEAT 1 WEEK

## 2019-05-09 NOTE — Telephone Encounter (Signed)
CURRENT REGIMEN UNCLEAR:  2 MG DAILY?  OR 6 MG ALT WITH 3?  HOLD TONIGHTS DOSE

## 2019-05-09 NOTE — Telephone Encounter (Signed)
Spoke with pt and informed her of the coumadin regimen change. Pt gave a verbal understanding.

## 2019-05-13 ENCOUNTER — Encounter: Payer: Medicare Other | Admitting: Physician Assistant

## 2019-05-13 ENCOUNTER — Other Ambulatory Visit: Payer: Self-pay

## 2019-05-13 DIAGNOSIS — I890 Lymphedema, not elsewhere classified: Secondary | ICD-10-CM | POA: Diagnosis not present

## 2019-05-13 DIAGNOSIS — S8981XA Other specified injuries of right lower leg, initial encounter: Secondary | ICD-10-CM | POA: Diagnosis not present

## 2019-05-13 DIAGNOSIS — L97812 Non-pressure chronic ulcer of other part of right lower leg with fat layer exposed: Secondary | ICD-10-CM | POA: Diagnosis not present

## 2019-05-13 DIAGNOSIS — L97822 Non-pressure chronic ulcer of other part of left lower leg with fat layer exposed: Secondary | ICD-10-CM | POA: Diagnosis not present

## 2019-05-13 NOTE — Progress Notes (Addendum)
HONESTEE, REVARD ([id number]) Visit Report for 05/13/2019 Chief Complaint Document Details Patient Name: Diana [name], Diana [name]. Date of Service: 05/13/2019 12:30 PM Medical Record Number: [id number] Patient Account Number: [id number] Date of Birth/Sex: 09/27/1934 (84 y.o. F) Treating RN: Harold Barban Primary Care Provider: Deborra Medina Other Clinician: Referring Provider: Deborra Medina Treating Provider/Extender: Melburn Hake, Lottie Siska Weeks in Treatment: 2 Information Obtained from: Patient Chief Complaint Bilateral LE Ulcers and Severe Bilateral Lymphedema Electronic Signature(s) Signed: 05/13/2019 12:58:21 PM By: Worthy Keeler PA-C Entered By: Worthy Keeler on 05/13/2019 12:58:21 Dalby, Mollee BMarland Kitchen ([id number]) -------------------------------------------------------------------------------- HPI Details Patient Name: Diana Bene [name]. Date of Service: 05/13/2019 12:30 PM Medical Record Number: [id number] Patient Account Number: [id number] Date of Birth/Sex: 1935/08/21 (84 y.o. F) Treating RN: Harold Barban Primary Care Provider: Deborra Medina Other Clinician: Referring Provider: Deborra Medina Treating Provider/Extender: Melburn Hake, Catheryne Deford Weeks in Treatment: 2 History of Present Illness HPI Description: 04/29/2019 upon evaluation today patient presents for initial inspection her clinic concerning issues that she has been having with bilateral lower extremity edema for quite some time. She has unfortunately an extensive medical history which includes lymphedema, congestive heart failure, chronic kidney disease stage III which is trending towards stage IV, iron deficiency anemia, hypertension, and lupus. She is also on long-term anticoagulation therapy with Coumadin as well as Lasix. With that being said currently today she did have an injury where she sustained a cut to the back of her right leg which apparently there was a very difficult time getting this to completely healed. Fortunately it  is still healing and seems to be doing okay nonetheless she had a lot of bleeding and again that is what they really had a sufficiently difficult time getting a stop when she was seen in the emergency department. Fortunately there is no signs of active infection at this time which is good news. She states that Dr. Derrel Nip her primary care provider sent her here today for compression therapy. That is where the majority of our conversation was directed today. 05/06/2019 upon evaluation today patient appears to be doing very well with regard to the wounds on her bilateral lower extremities other than the fact that they seem to be somewhat dry with some of the Prisma sticking to the wound bed. With that being said think that she may do better with a different type of dressing to try to help this heal more appropriately and effectively. Fortunately there is no signs of active infection at this time she does have some discomfort however despite the fact that the wounds are not looking too poorly. 05/13/2019 on evaluation today patient appears to be doing very well in my opinion regarding her lower extremity ulcers. She has been tolerating the dressing changes without complication. Fortunately there is no signs of active infection at this time. No fevers, chills, nausea, vomiting, or diarrhea. Electronic Signature(s) Signed: 05/13/2019 2:26:21 PM By: Worthy Keeler PA-C Entered By: Worthy Keeler on 05/13/2019 14:26:21 Bedore, [name] ([id number]) -------------------------------------------------------------------------------- Physical Exam Details Patient Name: Diana [name], Diana [name]. Date of Service: 05/13/2019 12:30 PM Medical Record Number: [id number] Patient Account Number: [id number] Date of Birth/Sex: 11/12/1934 (84 y.o. F) Treating RN: Harold Barban Primary Care Provider: Deborra Medina Other Clinician: Referring Provider: Deborra Medina Treating Provider/Extender: STONE III, Deklen Popelka Weeks in  Treatment: 2 Constitutional Well-nourished and well-hydrated in no acute distress. Respiratory normal breathing without difficulty. clear to auscultation bilaterally. Cardiovascular regular rate and rhythm with normal S1, S2. Psychiatric this patient is able to make  encounter with the patient was in whole or in part for the following MEDICAL CONDITION: (primary reason for [place]) MEDICAL NECESSITY: I certify, that based on my findings, NURSING services are a medically necessary home health service. HOME BOUND STATUS: I certify that my clinical findings support that this patient is homebound (i.e., Due to illness or injury, pt requires aid of supportive devices such as crutches, cane, wheelchairs, walkers, the use of special transportation or the assistance of another person to leave their place of residence. There is a normal inability to leave the home and doing so requires considerable and taxing effort. Other absences are for medical reasons / religious services and are infrequent or of short duration when for other reasons). o If current dressing causes regression in wound condition, may D/C ordered dressing product/s and apply Normal Saline Moist Dressing daily until next [place] / Other MD appointment. Rogers of regression in wound condition at [phone]. o Please direct any NON-WOUND related issues/requests for orders to patient's Primary Care Physician Wound #2 [place] for [place] Wednesday and Fridays. Pleasechange the [place] Nurse may visit PRN to address patientos wound care needs. o FACE TO FACE ENCOUNTER: MEDICARE and MEDICAID PATIENTS: I certify that this  patient is under my care and that I had a face-to-face encounter that meets the physician face-to-face encounter requirements with this Diana [name], Diana [name]. ([id number]) patient on this date. The encounter with the patient was in whole or in part for the following MEDICAL CONDITION: (primary reason for [place]) MEDICAL NECESSITY: I certify, that based on my findings, NURSING services are a medically necessary home health service. HOME BOUND STATUS: I certify that my clinical findings support that this patient is homebound (i.e., Due to illness or injury, pt requires aid of supportive devices such as crutches, cane, wheelchairs, walkers, the use of special transportation or the assistance of another person to leave their place of residence. There is a normal inability to leave the home and doing so requires considerable and taxing effort. Other absences are for medical reasons / religious services and are infrequent or of short duration when for other reasons). o If current dressing causes regression in wound condition, may D/C ordered dressing product/s and apply Normal Saline Moist Dressing daily until next [place] / Other MD appointment. [place] of regression in wound condition at [phone]. o Please direct any NON-WOUND related issues/requests for orders to patient's Primary Care Physician Electronic Signature(s) Signed: 05/13/2019 4:17:37 PM By: Harold Barban Signed: 05/13/2019 5:06:11 PM By: Worthy Keeler PA-C Entered By: Harold Barban on 05/13/2019 13:03:13 Diana [name], Diana BMarland Kitchen ([id number]) -------------------------------------------------------------------------------- Problem List Details Patient Name: Demby, Kynadie [name]. Date of Service: 05/13/2019 12:30 PM Medical Record Number: [id number] Patient Account Number: [id number] Date of Birth/Sex: 05/10/1935 (84 y.o. F) Treating RN: Harold Barban Primary Care Provider: Deborra Medina Other  Clinician: Referring Provider: Deborra Medina Treating Provider/Extender: Melburn Hake, Doris Mcgilvery Weeks in Treatment: 2 Active Problems ICD-10 Evaluated Encounter Code Description Active Date Today Diagnosis I89.0 Lymphedema, not elsewhere classified 04/29/2019 No Yes L97.812 Non-pressure chronic ulcer of other part of right lower leg 04/29/2019 No Yes with fat layer exposed L97.822 Non-pressure chronic ulcer of other part of left lower leg with 04/29/2019 No Yes fat layer exposed I50.42 Chronic combined systolic (congestive) and diastolic 03/14/9484 No Yes (congestive) heart failure N18.3 Chronic kidney  disease, stage 3 (moderate) 04/29/2019 No Yes D50.8 Other iron deficiency anemias 04/29/2019 No Yes I10 Essential (primary) hypertension 04/29/2019 No Yes Z79.01 Long term (current) use of anticoagulants 04/29/2019 No Yes M32.8 Other forms of systemic lupus erythematosus 04/29/2019 No Yes Inactive Problems Ingman, Kechia [name]. ([id number]) Resolved Problems Electronic Signature(s) Signed: 05/13/2019 12:58:02 PM By: Worthy Keeler PA-C Entered By: Worthy Keeler on 05/13/2019 12:58:01 Dinovo, Neeley [name]. ([id number]) -------------------------------------------------------------------------------- Progress Note Details Patient Name: Muratore, Lyndel [name]. Date of Service: 05/13/2019 12:30 PM Medical Record Number: [id number] Patient Account Number: [id number] Date of Birth/Sex: 17-Jan-1935 (84 y.o. F) Treating RN: Harold Barban Primary Care Provider: Deborra Medina Other Clinician: Referring Provider: Deborra Medina Treating Provider/Extender: Melburn Hake, Keimani Laufer Weeks in Treatment: 2 Subjective Chief Complaint Information obtained from Patient Bilateral LE Ulcers and Severe Bilateral Lymphedema History of Present Illness (HPI) 04/29/2019 upon evaluation today patient presents for initial inspection her clinic concerning issues that she has been having with bilateral lower extremity edema for quite some time.  She has unfortunately an extensive medical history which includes lymphedema, congestive heart failure, chronic kidney disease stage III which is trending towards stage IV, iron deficiency anemia, hypertension, and lupus. She is also on long-term anticoagulation therapy with Coumadin as well as Lasix. With that being said currently today she did have an injury where she sustained a cut to the back of her right leg which apparently there was a very difficult time getting this to completely healed. Fortunately it is still healing and seems to be doing okay nonetheless she had a lot of bleeding and again that is what they really had a sufficiently difficult time getting a stop when she was seen in the emergency department. Fortunately there is no signs of active infection at this time which is good news. She states that Dr. Derrel Nip her primary care provider sent her here today for compression therapy. That is where the majority of our conversation was directed today. 05/06/2019 upon evaluation today patient appears to be doing very well with regard to the wounds on her bilateral lower extremities other than the fact that they seem to be somewhat dry with some of the Prisma sticking to the wound bed. With that being said think that she may do better with a different type of dressing to try to help this heal more appropriately and effectively. Fortunately there is no signs of active infection at this time she does have some discomfort however despite the fact that the wounds are not looking too poorly. 05/13/2019 on evaluation today patient appears to be doing very well in my opinion regarding her lower extremity ulcers. She has been tolerating the dressing changes without complication. Fortunately there is no signs of active infection at this time. No fevers, chills, nausea, vomiting, or diarrhea. Patient History Information obtained from Patient. Family History Cancer - Siblings, Heart Disease - Mother,  Tuberculosis - Mother, No family history of Diabetes, Hypertension, Kidney Disease, Lung Disease, Seizures, Stroke, Thyroid Problems. Social History Former smoker - Quit 40 years ago, Marital Status - Divorced, Alcohol Use - Rarely, Drug Use - No History, Caffeine Use - Moderate. Medical History Eyes Patient has history of Cataracts - removed Ear/Nose/Mouth/Throat Patient has history of Chronic sinus problems/congestion Hematologic/Lymphatic Patient has history of Anemia - Injections every 3 weeks Cardiovascular Pauling, Navia [name]. ([id number]) Patient has history of Congestive Heart Failure Denies history of Hypertension Endocrine Patient has history of Type II Diabetes - no medication taken Genitourinary Patient has history of End  HONESTEE, REVARD ([id number]) Visit Report for 05/13/2019 Chief Complaint Document Details Patient Name: Diana [name], Diana [name]. Date of Service: 05/13/2019 12:30 PM Medical Record Number: [id number] Patient Account Number: [id number] Date of Birth/Sex: 09/27/1934 (84 y.o. F) Treating RN: Harold Barban Primary Care Provider: Deborra Medina Other Clinician: Referring Provider: Deborra Medina Treating Provider/Extender: Melburn Hake, Lottie Siska Weeks in Treatment: 2 Information Obtained from: Patient Chief Complaint Bilateral LE Ulcers and Severe Bilateral Lymphedema Electronic Signature(s) Signed: 05/13/2019 12:58:21 PM By: Worthy Keeler PA-C Entered By: Worthy Keeler on 05/13/2019 12:58:21 Dalby, Mollee BMarland Kitchen ([id number]) -------------------------------------------------------------------------------- HPI Details Patient Name: Diana Bene [name]. Date of Service: 05/13/2019 12:30 PM Medical Record Number: [id number] Patient Account Number: [id number] Date of Birth/Sex: 1935/08/21 (84 y.o. F) Treating RN: Harold Barban Primary Care Provider: Deborra Medina Other Clinician: Referring Provider: Deborra Medina Treating Provider/Extender: Melburn Hake, Catheryne Deford Weeks in Treatment: 2 History of Present Illness HPI Description: 04/29/2019 upon evaluation today patient presents for initial inspection her clinic concerning issues that she has been having with bilateral lower extremity edema for quite some time. She has unfortunately an extensive medical history which includes lymphedema, congestive heart failure, chronic kidney disease stage III which is trending towards stage IV, iron deficiency anemia, hypertension, and lupus. She is also on long-term anticoagulation therapy with Coumadin as well as Lasix. With that being said currently today she did have an injury where she sustained a cut to the back of her right leg which apparently there was a very difficult time getting this to completely healed. Fortunately it  is still healing and seems to be doing okay nonetheless she had a lot of bleeding and again that is what they really had a sufficiently difficult time getting a stop when she was seen in the emergency department. Fortunately there is no signs of active infection at this time which is good news. She states that Dr. Derrel Nip her primary care provider sent her here today for compression therapy. That is where the majority of our conversation was directed today. 05/06/2019 upon evaluation today patient appears to be doing very well with regard to the wounds on her bilateral lower extremities other than the fact that they seem to be somewhat dry with some of the Prisma sticking to the wound bed. With that being said think that she may do better with a different type of dressing to try to help this heal more appropriately and effectively. Fortunately there is no signs of active infection at this time she does have some discomfort however despite the fact that the wounds are not looking too poorly. 05/13/2019 on evaluation today patient appears to be doing very well in my opinion regarding her lower extremity ulcers. She has been tolerating the dressing changes without complication. Fortunately there is no signs of active infection at this time. No fevers, chills, nausea, vomiting, or diarrhea. Electronic Signature(s) Signed: 05/13/2019 2:26:21 PM By: Worthy Keeler PA-C Entered By: Worthy Keeler on 05/13/2019 14:26:21 Bedore, [name] ([id number]) -------------------------------------------------------------------------------- Physical Exam Details Patient Name: Diana [name], Diana [name]. Date of Service: 05/13/2019 12:30 PM Medical Record Number: [id number] Patient Account Number: [id number] Date of Birth/Sex: 11/12/1934 (84 y.o. F) Treating RN: Harold Barban Primary Care Provider: Deborra Medina Other Clinician: Referring Provider: Deborra Medina Treating Provider/Extender: STONE III, Deklen Popelka Weeks in  Treatment: 2 Constitutional Well-nourished and well-hydrated in no acute distress. Respiratory normal breathing without difficulty. clear to auscultation bilaterally. Cardiovascular regular rate and rhythm with normal S1, S2. Psychiatric this patient is able to make  decisions and demonstrates good insight into disease process. Alert and Oriented x 3. pleasant and cooperative. Notes On inspection of patient's wound she actually showed signs of more granulation starting to fill in at this point. There is still some connective tissue noted as well just within the subcutaneous tissue but again I feel like she is making good improvement I do not see any signs of infection and I do not think she needs any significant sharp debridement. Her swelling is much improved with the use of the [place] wrap she is very pleased with this as well as far as the overall appearance of her swelling is concerned as she states they are also comfortable. Electronic Signature(s) Signed: 05/13/2019 2:27:06 PM By: Worthy Keeler PA-C Entered By: Worthy Keeler on 05/13/2019 14:27:05 Ackerley, [name] ([id number]) -------------------------------------------------------------------------------- Physician Orders Details Patient Name: Diana Bene [name]. Date of Service: 05/13/2019 12:30 PM Medical Record Number: [id number] Patient Account Number: [id number] Date of Birth/Sex: 1934/11/18 (84 y.o. F) Treating RN: Harold Barban Primary Care Provider: Deborra Medina Other Clinician: Referring Provider: Deborra Medina Treating Provider/Extender: Melburn Hake, Orvill Coulthard Weeks in Treatment: 2 Verbal / Phone Orders: No Diagnosis Coding ICD-10 Coding Code Description I89.0 Lymphedema, not elsewhere classified L97.812 Non-pressure chronic ulcer of other part of right lower leg with fat layer exposed L97.822 Non-pressure chronic ulcer of other part of left lower leg with fat layer exposed I50.42 Chronic combined systolic  (congestive) and diastolic (congestive) heart failure N18.3 Chronic kidney disease, stage 3 (moderate) D50.8 Other iron deficiency anemias I10 Essential (primary) hypertension Z79.01 Long term (current) use of anticoagulants M32.8 Other forms of systemic lupus erythematosus Wound Cleansing Wound #1 Left,Anterior Lower Leg o Dial antibacterial soap, wash wounds, rinse and pat dry prior to dressing wounds Wound #2 Right,Lateral Lower Leg o Dial antibacterial soap, wash wounds, rinse and pat dry prior to dressing wounds Skin Barriers/Peri-Wound Care Wound #1 Left,Anterior Lower Leg o Barrier cream - Zinc oxide Wound #2 Right,Lateral Lower Leg o Barrier cream - Zinc oxide Primary Wound Dressing Wound #1 Left,Anterior Lower Leg o Hydrafera Blue Ready Transfer Wound #2 Right,Lateral Lower Leg o Hydrafera Blue Ready Transfer Secondary Dressing Wound #1 Left,Anterior Lower Leg o ABD pad Wound #2 Right,Lateral Lower Leg o ABD pad Diana [name], Diana [name]. ([id number]) Dressing Change Frequency Wound #1 Left,Anterior Lower Leg o Change Dressing Monday, Wednesday, Friday - Mondays-dressing changes in clinic. Wednesdays and Fridays- [place] will do dressing changes Wound #2 Right,Lateral Lower Leg o Change Dressing Monday, Wednesday, Friday - Mondays-dressing changes in clinic. Wednesdays and Fridays- [place] will do dressing changes Follow-up Appointments Wound #1 Left,Anterior Lower Leg o Return Appointment in 1 week. Wound #2 Right,Lateral Lower Leg o Return Appointment in 1 week. Edema Control Wound #1 Left,Anterior Lower Leg o Other: - Farrow 4000 Wraps Wound #2 Right,Lateral Lower Leg o Other: - Farrow 4000 Wraps Off-Loading Wound #1 Left,Anterior Lower Leg o Other: - Elevate legs when sitting Wound #2 Right,Lateral Lower Leg o Other: - Elevate legs when sitting Home Health Wound #1 [place] for  [place] Wednesday and Fridays. Pleasechange the [place] Nurse may visit PRN to address patientos wound care needs. o FACE TO FACE ENCOUNTER: MEDICARE and MEDICAID PATIENTS: I certify that this patient is under my care and that I had a face-to-face encounter that meets the physician face-to-face encounter requirements with this patient on this date. The  HONESTEE, REVARD ([id number]) Visit Report for 05/13/2019 Chief Complaint Document Details Patient Name: Diana [name], Diana [name]. Date of Service: 05/13/2019 12:30 PM Medical Record Number: [id number] Patient Account Number: [id number] Date of Birth/Sex: 09/27/1934 (84 y.o. F) Treating RN: Harold Barban Primary Care Provider: Deborra Medina Other Clinician: Referring Provider: Deborra Medina Treating Provider/Extender: Melburn Hake, Lottie Siska Weeks in Treatment: 2 Information Obtained from: Patient Chief Complaint Bilateral LE Ulcers and Severe Bilateral Lymphedema Electronic Signature(s) Signed: 05/13/2019 12:58:21 PM By: Worthy Keeler PA-C Entered By: Worthy Keeler on 05/13/2019 12:58:21 Dalby, Mollee BMarland Kitchen ([id number]) -------------------------------------------------------------------------------- HPI Details Patient Name: Diana Bene [name]. Date of Service: 05/13/2019 12:30 PM Medical Record Number: [id number] Patient Account Number: [id number] Date of Birth/Sex: 1935/08/21 (84 y.o. F) Treating RN: Harold Barban Primary Care Provider: Deborra Medina Other Clinician: Referring Provider: Deborra Medina Treating Provider/Extender: Melburn Hake, Catheryne Deford Weeks in Treatment: 2 History of Present Illness HPI Description: 04/29/2019 upon evaluation today patient presents for initial inspection her clinic concerning issues that she has been having with bilateral lower extremity edema for quite some time. She has unfortunately an extensive medical history which includes lymphedema, congestive heart failure, chronic kidney disease stage III which is trending towards stage IV, iron deficiency anemia, hypertension, and lupus. She is also on long-term anticoagulation therapy with Coumadin as well as Lasix. With that being said currently today she did have an injury where she sustained a cut to the back of her right leg which apparently there was a very difficult time getting this to completely healed. Fortunately it  is still healing and seems to be doing okay nonetheless she had a lot of bleeding and again that is what they really had a sufficiently difficult time getting a stop when she was seen in the emergency department. Fortunately there is no signs of active infection at this time which is good news. She states that Dr. Derrel Nip her primary care provider sent her here today for compression therapy. That is where the majority of our conversation was directed today. 05/06/2019 upon evaluation today patient appears to be doing very well with regard to the wounds on her bilateral lower extremities other than the fact that they seem to be somewhat dry with some of the Prisma sticking to the wound bed. With that being said think that she may do better with a different type of dressing to try to help this heal more appropriately and effectively. Fortunately there is no signs of active infection at this time she does have some discomfort however despite the fact that the wounds are not looking too poorly. 05/13/2019 on evaluation today patient appears to be doing very well in my opinion regarding her lower extremity ulcers. She has been tolerating the dressing changes without complication. Fortunately there is no signs of active infection at this time. No fevers, chills, nausea, vomiting, or diarrhea. Electronic Signature(s) Signed: 05/13/2019 2:26:21 PM By: Worthy Keeler PA-C Entered By: Worthy Keeler on 05/13/2019 14:26:21 Bedore, [name] ([id number]) -------------------------------------------------------------------------------- Physical Exam Details Patient Name: Diana [name], Diana [name]. Date of Service: 05/13/2019 12:30 PM Medical Record Number: [id number] Patient Account Number: [id number] Date of Birth/Sex: 11/12/1934 (84 y.o. F) Treating RN: Harold Barban Primary Care Provider: Deborra Medina Other Clinician: Referring Provider: Deborra Medina Treating Provider/Extender: STONE III, Deklen Popelka Weeks in  Treatment: 2 Constitutional Well-nourished and well-hydrated in no acute distress. Respiratory normal breathing without difficulty. clear to auscultation bilaterally. Cardiovascular regular rate and rhythm with normal S1, S2. Psychiatric this patient is able to make  HONESTEE, REVARD ([id number]) Visit Report for 05/13/2019 Chief Complaint Document Details Patient Name: Diana [name], Diana [name]. Date of Service: 05/13/2019 12:30 PM Medical Record Number: [id number] Patient Account Number: [id number] Date of Birth/Sex: 09/27/1934 (84 y.o. F) Treating RN: Harold Barban Primary Care Provider: Deborra Medina Other Clinician: Referring Provider: Deborra Medina Treating Provider/Extender: Melburn Hake, Lottie Siska Weeks in Treatment: 2 Information Obtained from: Patient Chief Complaint Bilateral LE Ulcers and Severe Bilateral Lymphedema Electronic Signature(s) Signed: 05/13/2019 12:58:21 PM By: Worthy Keeler PA-C Entered By: Worthy Keeler on 05/13/2019 12:58:21 Dalby, Mollee BMarland Kitchen ([id number]) -------------------------------------------------------------------------------- HPI Details Patient Name: Diana Bene [name]. Date of Service: 05/13/2019 12:30 PM Medical Record Number: [id number] Patient Account Number: [id number] Date of Birth/Sex: 1935/08/21 (84 y.o. F) Treating RN: Harold Barban Primary Care Provider: Deborra Medina Other Clinician: Referring Provider: Deborra Medina Treating Provider/Extender: Melburn Hake, Catheryne Deford Weeks in Treatment: 2 History of Present Illness HPI Description: 04/29/2019 upon evaluation today patient presents for initial inspection her clinic concerning issues that she has been having with bilateral lower extremity edema for quite some time. She has unfortunately an extensive medical history which includes lymphedema, congestive heart failure, chronic kidney disease stage III which is trending towards stage IV, iron deficiency anemia, hypertension, and lupus. She is also on long-term anticoagulation therapy with Coumadin as well as Lasix. With that being said currently today she did have an injury where she sustained a cut to the back of her right leg which apparently there was a very difficult time getting this to completely healed. Fortunately it  is still healing and seems to be doing okay nonetheless she had a lot of bleeding and again that is what they really had a sufficiently difficult time getting a stop when she was seen in the emergency department. Fortunately there is no signs of active infection at this time which is good news. She states that Dr. Derrel Nip her primary care provider sent her here today for compression therapy. That is where the majority of our conversation was directed today. 05/06/2019 upon evaluation today patient appears to be doing very well with regard to the wounds on her bilateral lower extremities other than the fact that they seem to be somewhat dry with some of the Prisma sticking to the wound bed. With that being said think that she may do better with a different type of dressing to try to help this heal more appropriately and effectively. Fortunately there is no signs of active infection at this time she does have some discomfort however despite the fact that the wounds are not looking too poorly. 05/13/2019 on evaluation today patient appears to be doing very well in my opinion regarding her lower extremity ulcers. She has been tolerating the dressing changes without complication. Fortunately there is no signs of active infection at this time. No fevers, chills, nausea, vomiting, or diarrhea. Electronic Signature(s) Signed: 05/13/2019 2:26:21 PM By: Worthy Keeler PA-C Entered By: Worthy Keeler on 05/13/2019 14:26:21 Bedore, [name] ([id number]) -------------------------------------------------------------------------------- Physical Exam Details Patient Name: Diana [name], Diana [name]. Date of Service: 05/13/2019 12:30 PM Medical Record Number: [id number] Patient Account Number: [id number] Date of Birth/Sex: 11/12/1934 (84 y.o. F) Treating RN: Harold Barban Primary Care Provider: Deborra Medina Other Clinician: Referring Provider: Deborra Medina Treating Provider/Extender: STONE III, Deklen Popelka Weeks in  Treatment: 2 Constitutional Well-nourished and well-hydrated in no acute distress. Respiratory normal breathing without difficulty. clear to auscultation bilaterally. Cardiovascular regular rate and rhythm with normal S1, S2. Psychiatric this patient is able to make  encounter with the patient was in whole or in part for the following MEDICAL CONDITION: (primary reason for [place]) MEDICAL NECESSITY: I certify, that based on my findings, NURSING services are a medically necessary home health service. HOME BOUND STATUS: I certify that my clinical findings support that this patient is homebound (i.e., Due to illness or injury, pt requires aid of supportive devices such as crutches, cane, wheelchairs, walkers, the use of special transportation or the assistance of another person to leave their place of residence. There is a normal inability to leave the home and doing so requires considerable and taxing effort. Other absences are for medical reasons / religious services and are infrequent or of short duration when for other reasons). o If current dressing causes regression in wound condition, may D/C ordered dressing product/s and apply Normal Saline Moist Dressing daily until next [place] / Other MD appointment. Rogers of regression in wound condition at [phone]. o Please direct any NON-WOUND related issues/requests for orders to patient's Primary Care Physician Wound #2 [place] for [place] Wednesday and Fridays. Pleasechange the [place] Nurse may visit PRN to address patientos wound care needs. o FACE TO FACE ENCOUNTER: MEDICARE and MEDICAID PATIENTS: I certify that this  patient is under my care and that I had a face-to-face encounter that meets the physician face-to-face encounter requirements with this Diana [name], Diana [name]. ([id number]) patient on this date. The encounter with the patient was in whole or in part for the following MEDICAL CONDITION: (primary reason for [place]) MEDICAL NECESSITY: I certify, that based on my findings, NURSING services are a medically necessary home health service. HOME BOUND STATUS: I certify that my clinical findings support that this patient is homebound (i.e., Due to illness or injury, pt requires aid of supportive devices such as crutches, cane, wheelchairs, walkers, the use of special transportation or the assistance of another person to leave their place of residence. There is a normal inability to leave the home and doing so requires considerable and taxing effort. Other absences are for medical reasons / religious services and are infrequent or of short duration when for other reasons). o If current dressing causes regression in wound condition, may D/C ordered dressing product/s and apply Normal Saline Moist Dressing daily until next [place] / Other MD appointment. [place] of regression in wound condition at [phone]. o Please direct any NON-WOUND related issues/requests for orders to patient's Primary Care Physician Electronic Signature(s) Signed: 05/13/2019 4:17:37 PM By: Harold Barban Signed: 05/13/2019 5:06:11 PM By: Worthy Keeler PA-C Entered By: Harold Barban on 05/13/2019 13:03:13 Diana [name], Diana BMarland Kitchen ([id number]) -------------------------------------------------------------------------------- Problem List Details Patient Name: Demby, Kynadie [name]. Date of Service: 05/13/2019 12:30 PM Medical Record Number: [id number] Patient Account Number: [id number] Date of Birth/Sex: 05/10/1935 (84 y.o. F) Treating RN: Harold Barban Primary Care Provider: Deborra Medina Other  Clinician: Referring Provider: Deborra Medina Treating Provider/Extender: Melburn Hake, Doris Mcgilvery Weeks in Treatment: 2 Active Problems ICD-10 Evaluated Encounter Code Description Active Date Today Diagnosis I89.0 Lymphedema, not elsewhere classified 04/29/2019 No Yes L97.812 Non-pressure chronic ulcer of other part of right lower leg 04/29/2019 No Yes with fat layer exposed L97.822 Non-pressure chronic ulcer of other part of left lower leg with 04/29/2019 No Yes fat layer exposed I50.42 Chronic combined systolic (congestive) and diastolic 03/14/9484 No Yes (congestive) heart failure N18.3 Chronic kidney

## 2019-05-13 NOTE — Progress Notes (Signed)
Diana [name], Diana [name] ([id number]) Visit Report for 05/13/2019 Arrival Information Details Patient Name: Diana [name], Diana [name] B. Date of Service: 05/13/2019 12:30 PM Medical Record Number: [id number] Patient Account Number: [id number] Date of Birth/Sex: 1935/07/28 (84 y.o. F) Treating RN: Montey Hora Primary Care November Sypher: Deborra Medina Other Clinician: Referring Nathaneal Sommers: Deborra Medina Treating Essense Bousquet/Extender: Melburn Hake, HOYT Weeks in Treatment: 2 Visit Information History Since Last Visit Added or deleted any medications: No Patient Arrived: Wheel Chair Any new allergies or adverse reactions: No Arrival Time: 12:38 Had a fall or experienced change in No Accompanied By: daughter activities of daily living that may affect Transfer Assistance: Manual risk of falls: Patient Identification Verified: Yes Signs or symptoms of abuse/neglect since last visito No Secondary Verification Process Yes Hospitalized since last visit: No Completed: Implantable device outside of the clinic excluding No Patient Requires Transmission-Based No cellular tissue based products placed in the center Precautions: since last visit: Patient Has Alerts: Yes Has Dressing in Place as Prescribed: Yes Patient Alerts: Patient on Blood Has Compression in Place as Prescribed: Yes Thinner Pain Present Now: No Warfarin Type II Diabetic Electronic Signature(s) Signed: 05/13/2019 3:54:17 PM By: Montey Hora Entered By: Montey Hora on 05/13/2019 12:41:30 Diana [name], Diana [name] ([id number]) -------------------------------------------------------------------------------- Clinic Level of Care Assessment Details Patient Name: Diana Bene B. Date of Service: 05/13/2019 12:30 PM Medical Record Number: [id number] Patient Account Number: [id number] Date of Birth/Sex: 07/01/1935 (84 y.o. F) Treating RN: Harold Barban Primary Care Jessie Cowher: Deborra Medina Other Clinician: Referring Aryaa Bunting: Deborra Medina Treating  Shannia Jacuinde/Extender: Melburn Hake, HOYT Weeks in Treatment: 2 Clinic Level of Care Assessment Items TOOL 4 Quantity Score []  - Use when only an EandM is performed on FOLLOW-UP visit 0 ASSESSMENTS - Nursing Assessment / Reassessment X - Reassessment of Co-morbidities (includes updates in patient status) 1 10 X- 1 5 Reassessment of Adherence to Treatment Plan ASSESSMENTS - Wound and Skin Assessment / Reassessment []  - Simple Wound Assessment / Reassessment - one wound 0 X- 2 5 Complex Wound Assessment / Reassessment - multiple wounds []  - 0 Dermatologic / Skin Assessment (not related to wound area) ASSESSMENTS - Focused Assessment []  - Circumferential Edema Measurements - multi extremities 0 []  - 0 Nutritional Assessment / Counseling / Intervention []  - 0 Lower Extremity Assessment (monofilament, tuning fork, pulses) []  - 0 Peripheral Arterial Disease Assessment (using hand held doppler) ASSESSMENTS - Ostomy and/or Continence Assessment and Care []  - Incontinence Assessment and Management 0 []  - 0 Ostomy Care Assessment and Management (repouching, etc.) PROCESS - Coordination of Care X - Simple Patient / Family Education for ongoing care 1 15 []  - 0 Complex (extensive) Patient / Family Education for ongoing care []  - 0 Staff obtains Programmer, systems, Records, Test Results / Process Orders []  - 0 Staff telephones HHA, Nursing Homes / Clarify orders / etc []  - 0 Routine Transfer to another Facility (non-emergent condition) []  - 0 Routine Hospital Admission (non-emergent condition) []  - 0 New Admissions / Biomedical engineer / Ordering NPWT, Apligraf, etc. []  - 0 Emergency Hospital Admission (emergent condition) X- 1 10 Simple Discharge Coordination [name], [name] ([id number]) []  - 0 Complex (extensive) Discharge Coordination PROCESS - Special Needs []  - Pediatric / Minor Patient Management 0 []  - 0 Isolation Patient Management []  - 0 Hearing / Language / Visual special  needs []  - 0 Assessment of Community assistance (transportation, D/C planning, etc.) []  - 0 Additional assistance / Altered mentation []  - 0 Support Surface(s) Assessment (bed, cushion, seat, etc.) INTERVENTIONS - Wound Cleansing /  Cataracts, Chronic sinus Cataracts, Chronic sinus N/A problems/congestion, Anemia, problems/congestion, Anemia, Congestive Heart Failure, Congestive Heart Failure, Type II Diabetes, End Stage Type II Diabetes, End Stage Renal Disease, Lupus Renal Disease, Lupus Erythematosus, Neuropathy, Erythematosus, Neuropathy, Received Radiation Received Radiation Date Acquired: 03/18/2019 04/26/2019 N/A Weeks of Treatment: 2 2 N/A Wound Status: Open Open N/A Measurements L x W x D 1x1.3x0.1 0.9x0.5x0.2 N/A (cm) Area (cm) : 1.021 0.353 N/A Volume (cm) : 0.102 0.071 N/A % Reduction in Area: 71.00% 43.80% N/A % Reduction in Volume: 71.00% 62.20% N/A Classification: Full Thickness Without Full Thickness Without N/A Exposed Support Structures Exposed Support Structures Exudate Amount: Medium Medium N/A Exudate Type: Serous Serous N/A Exudate Color: amber amber N/A Wound Margin: Indistinct, nonvisible Flat and Intact N/A Granulation Amount: Medium (34-66%) Large (67-100%) N/A Granulation Quality: N/A Pink N/A Diana [name], [name] ([id number]) Necrotic Amount: Medium (34-66%) Small (1-33%) N/A Exposed Structures: Fat Layer (Subcutaneous Fat Layer (Subcutaneous N/A Tissue) Exposed: Yes Tissue) Exposed: Yes Fascia: No Fascia: No Tendon: No Tendon: No Muscle:  No Muscle: No Joint: No Joint: No Bone: No Bone: No Epithelialization: Small (1-33%) None N/A Treatment Notes Electronic Signature(s) Signed: 05/13/2019 4:17:37 PM By: Harold Barban Entered By: Harold Barban on 05/13/2019 13:00:03 Diana [name], Diana [name] ([id number]) -------------------------------------------------------------------------------- [place] Details Patient Name: Diana Bene B. Date of Service: 05/13/2019 12:30 PM Medical Record Number: [id number] Patient Account Number: [id number] Date of Birth/Sex: [birth year] (83 y.o. F) Treating RN: Harold Barban Primary Care Joni Colegrove: Deborra Medina Other Clinician: Referring Coralie Stanke: Deborra Medina Treating Hebe Merriwether/Extender: Melburn Hake, HOYT Weeks in Treatment: 2 Active Inactive Wound/Skin Impairment Nursing Diagnoses: Impaired tissue integrity Goals: Ulcer/skin breakdown will have a volume reduction of 30% by week 4 Date Initiated: 04/29/2019 Target Resolution Date: 05/30/2019 Goal Status: Active Interventions: Assess patient/caregiver ability to obtain necessary supplies Assess patient/caregiver ability to perform ulcer/skin care regimen upon admission and as needed Assess ulceration(s) every visit Notes: Electronic Signature(s) Signed: 05/13/2019 4:17:37 PM By: Harold Barban Entered By: Harold Barban on 05/13/2019 12:59:47 Mader, Bathsheba B. ([id number]) -------------------------------------------------------------------------------- Pain Assessment Details Patient Name: Peragine, Jaelee B. Date of Service: 05/13/2019 12:30 PM Medical Record Number: [id number] Patient Account Number: [id number] Date of Birth/Sex: 28-Jan-1935 (84 y.o. F) Treating RN: Montey Hora Primary Care Shakeia Krus: Deborra Medina Other Clinician: Referring Shailah Gibbins: Deborra Medina Treating Brendyn Mclaren/Extender: Melburn Hake, HOYT Weeks in Treatment: 2 Active Problems Location of Pain Severity and Description of Pain Patient Has  Paino Yes Site Locations Pain Location: Generalized Pain Pain Management and Medication Current Pain Management: Electronic Signature(s) Signed: 05/13/2019 3:54:17 PM By: Montey Hora Entered By: Montey Hora on 05/13/2019 12:41:42 Diana [name], Diana [name] ([id number]) -------------------------------------------------------------------------------- Patient/Caregiver Education Details Patient Name: Diana Bene B. Date of Service: 05/13/2019 12:30 PM Medical Record Number: [id number] Patient Account Number: [id number] Date of Birth/Gender: 1935/06/29 (84 y.o. F) Treating RN: Harold Barban Primary Care Physician: Deborra Medina Other Clinician: Referring Physician: Deborra Medina Treating Physician/Extender: Sharalyn Ink in Treatment: 2 Education Assessment Education Provided To: Patient Education Topics Provided Venous: Handouts: Controlling Swelling with Compression Stockings Methods: Demonstration, Explain/Verbal Responses: State content correctly Wound/Skin Impairment: Handouts: Caring for Your Ulcer Methods: Demonstration, Explain/Verbal Responses: State content correctly Electronic Signature(s) Signed: 05/13/2019 4:17:37 PM By: Harold Barban Entered By: Harold Barban on 05/13/2019 13:00:44 Diana [name], [name] ([id number]) -------------------------------------------------------------------------------- Wound Assessment Details Patient Name: Diana [name], [name] Date of Service: 05/13/2019 12:30 PM Medical Record Number: [id number] Patient Account Number: [id number] Date of Birth/Sex: 1935/07/24 (84 y.o. F) Treating RN: Montey Hora Primary Care Daxton Nydam: Derrel Nip  Measurement []  - Simple Wound Cleansing - one wound 0 X- 2 5 Complex Wound Cleansing - multiple wounds X- 1 5 Wound Imaging (photographs - any number of wounds) []  - 0 Wound Tracing (instead of photographs) []  - 0 Simple Wound Measurement - one wound X- 2 5 Complex Wound Measurement - multiple wounds INTERVENTIONS - Wound Dressings X - Small Wound Dressing one or multiple wounds 2 10 []  - 0 Medium Wound Dressing one or multiple wounds []  - 0 Large Wound Dressing one or multiple wounds []  - 0 Application of Medications - topical []  - 0 Application of Medications - injection INTERVENTIONS - Miscellaneous []  - External ear exam 0 []  - 0 Specimen Collection (cultures, biopsies, blood, body fluids, etc.) []  - 0 Specimen(s) / Culture(s) sent or taken to Lab for analysis []  - 0 Patient Transfer (multiple staff / Civil Service fast streamer / Similar devices) []  - 0 Simple Staple / Suture removal (25 or less) []  - 0 Complex Staple / Suture removal (26 or more) []  - 0 Hypo / Hyperglycemic Management (close monitor of Blood Glucose) []  - 0 Ankle / Brachial Index (ABI) - do not check if billed separately X- 1 5 Vital Signs Diana [name], [name] ([id number]) Has the patient been seen at the hospital within the last three years: Yes Total Score: 100 Level Of Care: New/Established - Level 3 Electronic Signature(s) Signed: 05/13/2019 4:17:37 PM By: Harold Barban Entered By: Harold Barban on 05/13/2019 13:01:32 Diana [name], Diana [name] ([id number]) -------------------------------------------------------------------------------- Encounter Discharge Information Details Patient Name: Perdue, Dellie B. Date of Service: 05/13/2019 12:30 PM Medical Record Number:  [id number] Patient Account Number: [id number] Date of Birth/Sex: [birth year] (84 y.o. F) Treating RN: Army Melia Primary Care Braidan Ricciardi: Deborra Medina Other Clinician: Referring Million Maharaj: Deborra Medina Treating Claudene Gatliff/Extender: Melburn Hake, HOYT Weeks in Treatment: 2 Encounter Discharge Information Items Discharge Condition: Stable Ambulatory Status: Wheelchair Discharge Destination: Home Transportation: Private Auto Accompanied By: self Schedule Follow-up Appointment: Yes Clinical Summary of Care: Electronic Signature(s) Signed: 05/13/2019 1:16:05 PM By: Army Melia Entered By: Army Melia on 05/13/2019 13:16:05 Diana [name], Diana [name] Kitchen ([id number]) -------------------------------------------------------------------------------- Lower Extremity Assessment Details Patient Name: Killam, Kalesha B. Date of Service: 05/13/2019 12:30 PM Medical Record Number: [id number] Patient Account Number: [id number] Date of Birth/Sex: 1934-12-03 (84 y.o. F) Treating RN: Montey Hora Primary Care Xitlally Mooneyham: Deborra Medina Other Clinician: Referring Sally-Anne Wamble: Deborra Medina Treating Tehran Rabenold/Extender: Melburn Hake, HOYT Weeks in Treatment: 2 Edema Assessment Assessed: [Left: No] [Right: No] Edema: [Left: Yes] [Right: Yes] Calf Left: Right: Point of Measurement: 32 cm From Medial Instep 34.5 cm 34.5 cm Ankle Left: Right: Point of Measurement: 11 cm From Medial Instep 24 cm 23.5 cm Vascular Assessment Pulses: Dorsalis Pedis Palpable: [Left:Yes] [Right:Yes] Electronic Signature(s) Signed: 05/13/2019 3:54:17 PM By: Montey Hora Entered By: Montey Hora on 05/13/2019 12:52:21 Diana [name], [name] ([id number]) -------------------------------------------------------------------------------- Multi Wound Chart Details Patient Name: Luby, Joannah B. Date of Service: 05/13/2019 12:30 PM Medical Record Number: [id number] Patient Account Number: [id number] Date of Birth/Sex: [birth year] (84 y.o. F) Treating RN:  Harold Barban Primary Care Dereka Lueras: Deborra Medina Other Clinician: Referring Min Collymore: Deborra Medina Treating Sayuri Rhames/Extender: Melburn Hake, HOYT Weeks in Treatment: 2 Vital Signs Height(in): 60 Pulse(bpm): 114 Weight(lbs): 106 Blood Pressure(mmHg): 100/60 Body Mass Index(BMI): 21 Temperature(F): 98.8 Respiratory Rate 16 (breaths/min): Photos: [N/A:N/A] Wound Location: Left Lower Leg - Anterior Right Lower Leg - Lateral N/A Wounding Event: Gradually Appeared Gradually Appeared N/A Primary Etiology: Lymphedema Trauma, Other N/A Secondary Etiology: Diabetic Wound/Ulcer of the Lymphedema N/A Lower Extremity Comorbid History:  Diana [name], Diana [name] ([id number]) Visit Report for 05/13/2019 Arrival Information Details Patient Name: Diana [name], Diana [name] B. Date of Service: 05/13/2019 12:30 PM Medical Record Number: [id number] Patient Account Number: [id number] Date of Birth/Sex: 1935/07/28 (84 y.o. F) Treating RN: Montey Hora Primary Care November Sypher: Deborra Medina Other Clinician: Referring Nathaneal Sommers: Deborra Medina Treating Essense Bousquet/Extender: Melburn Hake, HOYT Weeks in Treatment: 2 Visit Information History Since Last Visit Added or deleted any medications: No Patient Arrived: Wheel Chair Any new allergies or adverse reactions: No Arrival Time: 12:38 Had a fall or experienced change in No Accompanied By: daughter activities of daily living that may affect Transfer Assistance: Manual risk of falls: Patient Identification Verified: Yes Signs or symptoms of abuse/neglect since last visito No Secondary Verification Process Yes Hospitalized since last visit: No Completed: Implantable device outside of the clinic excluding No Patient Requires Transmission-Based No cellular tissue based products placed in the center Precautions: since last visit: Patient Has Alerts: Yes Has Dressing in Place as Prescribed: Yes Patient Alerts: Patient on Blood Has Compression in Place as Prescribed: Yes Thinner Pain Present Now: No Warfarin Type II Diabetic Electronic Signature(s) Signed: 05/13/2019 3:54:17 PM By: Montey Hora Entered By: Montey Hora on 05/13/2019 12:41:30 Diana [name], Diana [name] ([id number]) -------------------------------------------------------------------------------- Clinic Level of Care Assessment Details Patient Name: Diana Bene B. Date of Service: 05/13/2019 12:30 PM Medical Record Number: [id number] Patient Account Number: [id number] Date of Birth/Sex: 07/01/1935 (84 y.o. F) Treating RN: Harold Barban Primary Care Jessie Cowher: Deborra Medina Other Clinician: Referring Aryaa Bunting: Deborra Medina Treating  Shannia Jacuinde/Extender: Melburn Hake, HOYT Weeks in Treatment: 2 Clinic Level of Care Assessment Items TOOL 4 Quantity Score []  - Use when only an EandM is performed on FOLLOW-UP visit 0 ASSESSMENTS - Nursing Assessment / Reassessment X - Reassessment of Co-morbidities (includes updates in patient status) 1 10 X- 1 5 Reassessment of Adherence to Treatment Plan ASSESSMENTS - Wound and Skin Assessment / Reassessment []  - Simple Wound Assessment / Reassessment - one wound 0 X- 2 5 Complex Wound Assessment / Reassessment - multiple wounds []  - 0 Dermatologic / Skin Assessment (not related to wound area) ASSESSMENTS - Focused Assessment []  - Circumferential Edema Measurements - multi extremities 0 []  - 0 Nutritional Assessment / Counseling / Intervention []  - 0 Lower Extremity Assessment (monofilament, tuning fork, pulses) []  - 0 Peripheral Arterial Disease Assessment (using hand held doppler) ASSESSMENTS - Ostomy and/or Continence Assessment and Care []  - Incontinence Assessment and Management 0 []  - 0 Ostomy Care Assessment and Management (repouching, etc.) PROCESS - Coordination of Care X - Simple Patient / Family Education for ongoing care 1 15 []  - 0 Complex (extensive) Patient / Family Education for ongoing care []  - 0 Staff obtains Programmer, systems, Records, Test Results / Process Orders []  - 0 Staff telephones HHA, Nursing Homes / Clarify orders / etc []  - 0 Routine Transfer to another Facility (non-emergent condition) []  - 0 Routine Hospital Admission (non-emergent condition) []  - 0 New Admissions / Biomedical engineer / Ordering NPWT, Apligraf, etc. []  - 0 Emergency Hospital Admission (emergent condition) X- 1 10 Simple Discharge Coordination [name], [name] ([id number]) []  - 0 Complex (extensive) Discharge Coordination PROCESS - Special Needs []  - Pediatric / Minor Patient Management 0 []  - 0 Isolation Patient Management []  - 0 Hearing / Language / Visual special  needs []  - 0 Assessment of Community assistance (transportation, D/C planning, etc.) []  - 0 Additional assistance / Altered mentation []  - 0 Support Surface(s) Assessment (bed, cushion, seat, etc.) INTERVENTIONS - Wound Cleansing /

## 2019-05-15 DIAGNOSIS — Z7901 Long term (current) use of anticoagulants: Secondary | ICD-10-CM | POA: Diagnosis not present

## 2019-05-15 LAB — POCT INR: INR: 3.2 — AB (ref 0.9–1.1)

## 2019-05-17 ENCOUNTER — Other Ambulatory Visit: Payer: Self-pay

## 2019-05-17 ENCOUNTER — Encounter: Payer: Self-pay | Admitting: Oncology

## 2019-05-17 ENCOUNTER — Telehealth: Payer: Self-pay

## 2019-05-17 NOTE — Telephone Encounter (Signed)
Inr is still a little high   skip tonight's dose of warfarin and new regimen will be 1 mg every other day ,  starting tomorrow.  recheck one week.

## 2019-05-17 NOTE — Telephone Encounter (Signed)
INR from 05/15/2019 is 3.2. Pt stated that she is taking 1mg  daily.   Abstracted.

## 2019-05-17 NOTE — Telephone Encounter (Signed)
Spoke with pt and informed her of her INR results and her new warfarin regimen. Pt gave a verbal understanding.

## 2019-05-18 ENCOUNTER — Other Ambulatory Visit: Payer: Self-pay | Admitting: Internal Medicine

## 2019-05-20 ENCOUNTER — Encounter: Payer: Medicare Other | Admitting: Physician Assistant

## 2019-05-20 ENCOUNTER — Other Ambulatory Visit: Payer: Self-pay

## 2019-05-20 ENCOUNTER — Inpatient Hospital Stay (HOSPITAL_BASED_OUTPATIENT_CLINIC_OR_DEPARTMENT_OTHER): Payer: Medicare Other | Admitting: Oncology

## 2019-05-20 ENCOUNTER — Inpatient Hospital Stay: Payer: Medicare Other

## 2019-05-20 VITALS — BP 101/69 | HR 103 | Temp 97.0°F | Resp 16 | Wt 103.1 lb

## 2019-05-20 DIAGNOSIS — Z862 Personal history of diseases of the blood and blood-forming organs and certain disorders involving the immune mechanism: Secondary | ICD-10-CM

## 2019-05-20 DIAGNOSIS — S8981XA Other specified injuries of right lower leg, initial encounter: Secondary | ICD-10-CM | POA: Diagnosis not present

## 2019-05-20 DIAGNOSIS — D631 Anemia in chronic kidney disease: Secondary | ICD-10-CM

## 2019-05-20 DIAGNOSIS — D649 Anemia, unspecified: Secondary | ICD-10-CM | POA: Diagnosis not present

## 2019-05-20 DIAGNOSIS — Z853 Personal history of malignant neoplasm of breast: Secondary | ICD-10-CM | POA: Diagnosis not present

## 2019-05-20 DIAGNOSIS — Z170 Estrogen receptor positive status [ER+]: Secondary | ICD-10-CM | POA: Diagnosis not present

## 2019-05-20 DIAGNOSIS — S81801A Unspecified open wound, right lower leg, initial encounter: Secondary | ICD-10-CM | POA: Diagnosis not present

## 2019-05-20 DIAGNOSIS — Z923 Personal history of irradiation: Secondary | ICD-10-CM | POA: Diagnosis not present

## 2019-05-20 DIAGNOSIS — N189 Chronic kidney disease, unspecified: Secondary | ICD-10-CM

## 2019-05-20 DIAGNOSIS — Z79899 Other long term (current) drug therapy: Secondary | ICD-10-CM

## 2019-05-20 DIAGNOSIS — I129 Hypertensive chronic kidney disease with stage 1 through stage 4 chronic kidney disease, or unspecified chronic kidney disease: Secondary | ICD-10-CM | POA: Diagnosis not present

## 2019-05-20 DIAGNOSIS — S81802A Unspecified open wound, left lower leg, initial encounter: Secondary | ICD-10-CM | POA: Diagnosis not present

## 2019-05-20 NOTE — Progress Notes (Addendum)
Diana, [name] ([id number]) Visit Report for 05/20/2019 Chief Complaint Document Details Patient Name: [name], [name] Date of Service: 05/20/2019 12:30 PM Medical Record Number: [id number] Patient Account Number: [id number] Date of Birth/Sex: October 26, 1934 (84 y.o. F) Treating RN: Harold Barban Primary Care Provider: Deborra Medina Other Clinician: Referring Provider: Deborra Medina Treating Provider/Extender: Melburn Hake, Arien Morine Weeks in Treatment: 3 Information Obtained from: Patient Chief Complaint Bilateral LE Ulcers and Severe Bilateral Lymphedema Electronic Signature(s) Signed: 05/20/2019 12:48:29 PM By: Worthy Keeler PA-C Entered By: Worthy Keeler on 05/20/2019 12:48:29 Diana [name], Diana [name] Diana [name] ([id number]) -------------------------------------------------------------------------------- HPI Details Patient Name: Diana Bene B. Date of Service: 05/20/2019 12:30 PM Medical Record Number: [id number] Patient Account Number: [id number] Date of Birth/Sex: Apr 11, 1935 (84 y.o. F) Treating RN: Harold Barban Primary Care Provider: Deborra Medina Other Clinician: Referring Provider: Deborra Medina Treating Provider/Extender: Melburn Hake, Saryiah Bencosme Weeks in Treatment: 3 History of Present Illness HPI Description: 04/29/2019 upon evaluation today patient presents for initial inspection her clinic concerning issues that she has been having with bilateral lower extremity edema for quite some time. She has unfortunately an extensive medical history which includes lymphedema, congestive heart failure, chronic kidney disease stage III which is trending towards stage IV, iron deficiency anemia, hypertension, and lupus. She is also on long-term anticoagulation therapy with Coumadin as well as Lasix. With that being said currently today she did have an injury where she sustained a cut to the back of her right leg which apparently there was a very difficult time getting this to completely healed. Fortunately it  is still healing and seems to be doing okay nonetheless she had a lot of bleeding and again that is what they really had a sufficiently difficult time getting a stop when she was seen in the emergency department. Fortunately there is no signs of active infection at this time which is good news. She states that Dr. Derrel Nip her primary care provider sent her here today for compression therapy. That is where the majority of our conversation was directed today. 05/06/2019 upon evaluation today patient appears to be doing very well with regard to the wounds on her bilateral lower extremities other than the fact that they seem to be somewhat dry with some of the Prisma sticking to the wound bed. With that being said think that she may do better with a different type of dressing to try to help this heal more appropriately and effectively. Fortunately there is no signs of active infection at this time she does have some discomfort however despite the fact that the wounds are not looking too poorly. 05/13/2019 on evaluation today patient appears to be doing very well in my opinion regarding her lower extremity ulcers. She has been tolerating the dressing changes without complication. Fortunately there is no signs of active infection at this time. No fevers, chills, nausea, vomiting, or diarrhea. 05/20/2019 on evaluation today patient appears to be doing better with regard to her lower extremity ulcers. She has been tolerating the dressing changes without complication. Fortunately there is no signs of active infection at this time. No fevers, chills, nausea, vomiting, or diarrhea. Electronic Signature(s) Signed: 05/20/2019 1:08:13 PM By: Worthy Keeler PA-C Entered By: Worthy Keeler on 05/20/2019 13:08:12 [name], [name] -------------------------------------------------------------------------------- Physical Exam Details Patient Name: [name], [name] Date of Service: 05/20/2019 12:30  PM Medical Record Number: [id number] Patient Account Number: [id number] Date of Birth/Sex: 1934/10/04 (84 y.o. F) Treating RN: Harold Barban Primary Care Provider: Deborra Medina Other Clinician: Referring Provider:  Deborra Medina Treating Provider/Extender: Melburn Hake, Talal Fritchman Weeks in Treatment: 3 Constitutional Well-nourished and well-hydrated in no acute distress. Respiratory normal breathing without difficulty. clear to auscultation bilaterally. Cardiovascular regular rate and rhythm with normal S1, S2. Psychiatric this patient is able to make decisions and demonstrates good insight into disease process. Alert and Oriented x 3. pleasant and cooperative. Notes Patient's wounds currently all show to be measuring smaller there is good granulation and epithelialization noted at this time. Overall I am extremely pleased she is still having some pain but fortunately nothing as significant as what she has had previous. She still is tolerating the Farrow wraps without any complication. Electronic Signature(s) Signed: 05/20/2019 1:08:48 PM By: Worthy Keeler PA-C Entered By: Worthy Keeler on 05/20/2019 13:08:48 [name] -------------------------------------------------------------------------------- Physician Orders Details Patient Name: Diana Bene B. Date of Service: 05/20/2019 12:30 PM Medical Record Number: [id number] Patient Account Number: [id number] Date of Birth/Sex: 23-Dec-1934 (84 y.o. F) Treating RN: Harold Barban Primary Care Provider: Deborra Medina Other Clinician: Referring Provider: Deborra Medina Treating Provider/Extender: Melburn Hake, Yara Tomkinson Weeks in Treatment: 3 Verbal / Phone Orders: No Diagnosis Coding ICD-10 Coding Code Description I89.0 Lymphedema, not elsewhere classified L97.812 Non-pressure chronic ulcer of other part of right lower leg with fat layer exposed L97.822 Non-pressure chronic ulcer of other part of left lower leg with fat layer  exposed I50.42 Chronic combined systolic (congestive) and diastolic (congestive) heart failure N18.3 Chronic kidney disease, stage 3 (moderate) D50.8 Other iron deficiency anemias I10 Essential (primary) hypertension Z79.01 Long term (current) use of anticoagulants M32.8 Other forms of systemic lupus erythematosus Wound Cleansing Wound #1 Left,Anterior Lower Leg o Dial antibacterial soap, wash wounds, rinse and pat dry prior to dressing wounds Wound #2 Right,Lateral Lower Leg o Dial antibacterial soap, wash wounds, rinse and pat dry prior to dressing wounds Skin Barriers/Peri-Wound Care Wound #1 Left,Anterior Lower Leg o Barrier cream - Zinc oxide Wound #2 Right,Lateral Lower Leg o Barrier cream - Zinc oxide Primary Wound Dressing Wound #1 Left,Anterior Lower Leg o Hydrafera Blue Ready Transfer Wound #2 Right,Lateral Lower Leg o Hydrafera Blue Ready Transfer Secondary Dressing Wound #1 Left,Anterior Lower Leg o ABD pad Wound #2 Right,Lateral Lower Leg o ABD pad Roache, Kasie B. ([id number]) Dressing Change Frequency Wound #1 Left,Anterior Lower Leg o Change Dressing Monday, Wednesday, Friday - Mondays-dressing changes in clinic. Wednesdays and Fridays- [place] will do dressing changes Wound #2 Right,Lateral Lower Leg o Change Dressing Monday, Wednesday, Friday - Mondays-dressing changes in clinic. Wednesdays and Fridays- [place] will do dressing changes Follow-up Appointments Wound #1 Left,Anterior Lower Leg o Return Appointment in 2 weeks. - Monday September 14th Wound #2 Right,Lateral Lower Leg o Return Appointment in 2 weeks. - Monday September 14th Edema Control Wound #1 Left,Anterior Lower Leg o Other: - Farrow 4000 Wraps Wound #2 Right,Lateral Lower Leg o Other: - Farrow 4000 Wraps Off-Loading Wound #1 Left,Anterior Lower Leg o Other: - Elevate legs when sitting Wound #2 Right,Lateral Lower Leg o Other: - Elevate legs  when sitting Home Health Wound #1 [place] for [place] to visit Next Monday September 7th (clinic closed due to holiday) o [place] Wednesday and Fridays. Pleasechange the [place] Nurse may visit PRN to address patientos wound care needs. o FACE TO FACE ENCOUNTER: MEDICARE and MEDICAID PATIENTS: I certify that this patient is under my care and that I had  Diana, [name] ([id number]) Visit Report for 05/20/2019 Chief Complaint Document Details Patient Name: [name], [name] Date of Service: 05/20/2019 12:30 PM Medical Record Number: [id number] Patient Account Number: [id number] Date of Birth/Sex: October 26, 1934 (84 y.o. F) Treating RN: Harold Barban Primary Care Provider: Deborra Medina Other Clinician: Referring Provider: Deborra Medina Treating Provider/Extender: Melburn Hake, Arien Morine Weeks in Treatment: 3 Information Obtained from: Patient Chief Complaint Bilateral LE Ulcers and Severe Bilateral Lymphedema Electronic Signature(s) Signed: 05/20/2019 12:48:29 PM By: Worthy Keeler PA-C Entered By: Worthy Keeler on 05/20/2019 12:48:29 Diana [name], Diana [name] Diana [name] ([id number]) -------------------------------------------------------------------------------- HPI Details Patient Name: Diana Bene B. Date of Service: 05/20/2019 12:30 PM Medical Record Number: [id number] Patient Account Number: [id number] Date of Birth/Sex: Apr 11, 1935 (84 y.o. F) Treating RN: Harold Barban Primary Care Provider: Deborra Medina Other Clinician: Referring Provider: Deborra Medina Treating Provider/Extender: Melburn Hake, Saryiah Bencosme Weeks in Treatment: 3 History of Present Illness HPI Description: 04/29/2019 upon evaluation today patient presents for initial inspection her clinic concerning issues that she has been having with bilateral lower extremity edema for quite some time. She has unfortunately an extensive medical history which includes lymphedema, congestive heart failure, chronic kidney disease stage III which is trending towards stage IV, iron deficiency anemia, hypertension, and lupus. She is also on long-term anticoagulation therapy with Coumadin as well as Lasix. With that being said currently today she did have an injury where she sustained a cut to the back of her right leg which apparently there was a very difficult time getting this to completely healed. Fortunately it  is still healing and seems to be doing okay nonetheless she had a lot of bleeding and again that is what they really had a sufficiently difficult time getting a stop when she was seen in the emergency department. Fortunately there is no signs of active infection at this time which is good news. She states that Dr. Derrel Nip her primary care provider sent her here today for compression therapy. That is where the majority of our conversation was directed today. 05/06/2019 upon evaluation today patient appears to be doing very well with regard to the wounds on her bilateral lower extremities other than the fact that they seem to be somewhat dry with some of the Prisma sticking to the wound bed. With that being said think that she may do better with a different type of dressing to try to help this heal more appropriately and effectively. Fortunately there is no signs of active infection at this time she does have some discomfort however despite the fact that the wounds are not looking too poorly. 05/13/2019 on evaluation today patient appears to be doing very well in my opinion regarding her lower extremity ulcers. She has been tolerating the dressing changes without complication. Fortunately there is no signs of active infection at this time. No fevers, chills, nausea, vomiting, or diarrhea. 05/20/2019 on evaluation today patient appears to be doing better with regard to her lower extremity ulcers. She has been tolerating the dressing changes without complication. Fortunately there is no signs of active infection at this time. No fevers, chills, nausea, vomiting, or diarrhea. Electronic Signature(s) Signed: 05/20/2019 1:08:13 PM By: Worthy Keeler PA-C Entered By: Worthy Keeler on 05/20/2019 13:08:12 [name], [name] -------------------------------------------------------------------------------- Physical Exam Details Patient Name: [name], [name] Date of Service: 05/20/2019 12:30  PM Medical Record Number: [id number] Patient Account Number: [id number] Date of Birth/Sex: 1934/10/04 (84 y.o. F) Treating RN: Harold Barban Primary Care Provider: Deborra Medina Other Clinician: Referring Provider:  ADDILEE, [name] ([id number]) Visit Report for 05/20/2019 Chief Complaint Document Details Patient Name: [name], [name] Date of Service: 05/20/2019 12:30 PM Medical Record Number: [id number] Patient Account Number: [id number] Date of Birth/Sex: October 26, 1934 (84 y.o. F) Treating RN: Harold Barban Primary Care Provider: Deborra Medina Other Clinician: Referring Provider: Deborra Medina Treating Provider/Extender: Melburn Hake, Arien Morine Weeks in Treatment: 3 Information Obtained from: Patient Chief Complaint Bilateral LE Ulcers and Severe Bilateral Lymphedema Electronic Signature(s) Signed: 05/20/2019 12:48:29 PM By: Worthy Keeler PA-C Entered By: Worthy Keeler on 05/20/2019 12:48:29 Diana [name], Diana [name] Diana [name] ([id number]) -------------------------------------------------------------------------------- HPI Details Patient Name: Diana Bene B. Date of Service: 05/20/2019 12:30 PM Medical Record Number: [id number] Patient Account Number: [id number] Date of Birth/Sex: Apr 11, 1935 (84 y.o. F) Treating RN: Harold Barban Primary Care Provider: Deborra Medina Other Clinician: Referring Provider: Deborra Medina Treating Provider/Extender: Melburn Hake, Saryiah Bencosme Weeks in Treatment: 3 History of Present Illness HPI Description: 04/29/2019 upon evaluation today patient presents for initial inspection her clinic concerning issues that she has been having with bilateral lower extremity edema for quite some time. She has unfortunately an extensive medical history which includes lymphedema, congestive heart failure, chronic kidney disease stage III which is trending towards stage IV, iron deficiency anemia, hypertension, and lupus. She is also on long-term anticoagulation therapy with Coumadin as well as Lasix. With that being said currently today she did have an injury where she sustained a cut to the back of her right leg which apparently there was a very difficult time getting this to completely healed. Fortunately it  is still healing and seems to be doing okay nonetheless she had a lot of bleeding and again that is what they really had a sufficiently difficult time getting a stop when she was seen in the emergency department. Fortunately there is no signs of active infection at this time which is good news. She states that Dr. Derrel Nip her primary care provider sent her here today for compression therapy. That is where the majority of our conversation was directed today. 05/06/2019 upon evaluation today patient appears to be doing very well with regard to the wounds on her bilateral lower extremities other than the fact that they seem to be somewhat dry with some of the Prisma sticking to the wound bed. With that being said think that she may do better with a different type of dressing to try to help this heal more appropriately and effectively. Fortunately there is no signs of active infection at this time she does have some discomfort however despite the fact that the wounds are not looking too poorly. 05/13/2019 on evaluation today patient appears to be doing very well in my opinion regarding her lower extremity ulcers. She has been tolerating the dressing changes without complication. Fortunately there is no signs of active infection at this time. No fevers, chills, nausea, vomiting, or diarrhea. 05/20/2019 on evaluation today patient appears to be doing better with regard to her lower extremity ulcers. She has been tolerating the dressing changes without complication. Fortunately there is no signs of active infection at this time. No fevers, chills, nausea, vomiting, or diarrhea. Electronic Signature(s) Signed: 05/20/2019 1:08:13 PM By: Worthy Keeler PA-C Entered By: Worthy Keeler on 05/20/2019 13:08:12 [name], [name] -------------------------------------------------------------------------------- Physical Exam Details Patient Name: [name], [name] Date of Service: 05/20/2019 12:30  PM Medical Record Number: [id number] Patient Account Number: [id number] Date of Birth/Sex: 1934/10/04 (84 y.o. F) Treating RN: Harold Barban Primary Care Provider: Deborra Medina Other Clinician: Referring Provider:  Deborra Medina Treating Provider/Extender: Melburn Hake, Talal Fritchman Weeks in Treatment: 3 Constitutional Well-nourished and well-hydrated in no acute distress. Respiratory normal breathing without difficulty. clear to auscultation bilaterally. Cardiovascular regular rate and rhythm with normal S1, S2. Psychiatric this patient is able to make decisions and demonstrates good insight into disease process. Alert and Oriented x 3. pleasant and cooperative. Notes Patient's wounds currently all show to be measuring smaller there is good granulation and epithelialization noted at this time. Overall I am extremely pleased she is still having some pain but fortunately nothing as significant as what she has had previous. She still is tolerating the Farrow wraps without any complication. Electronic Signature(s) Signed: 05/20/2019 1:08:48 PM By: Worthy Keeler PA-C Entered By: Worthy Keeler on 05/20/2019 13:08:48 [name] -------------------------------------------------------------------------------- Physician Orders Details Patient Name: Diana Bene B. Date of Service: 05/20/2019 12:30 PM Medical Record Number: [id number] Patient Account Number: [id number] Date of Birth/Sex: 23-Dec-1934 (84 y.o. F) Treating RN: Harold Barban Primary Care Provider: Deborra Medina Other Clinician: Referring Provider: Deborra Medina Treating Provider/Extender: Melburn Hake, Yara Tomkinson Weeks in Treatment: 3 Verbal / Phone Orders: No Diagnosis Coding ICD-10 Coding Code Description I89.0 Lymphedema, not elsewhere classified L97.812 Non-pressure chronic ulcer of other part of right lower leg with fat layer exposed L97.822 Non-pressure chronic ulcer of other part of left lower leg with fat layer  exposed I50.42 Chronic combined systolic (congestive) and diastolic (congestive) heart failure N18.3 Chronic kidney disease, stage 3 (moderate) D50.8 Other iron deficiency anemias I10 Essential (primary) hypertension Z79.01 Long term (current) use of anticoagulants M32.8 Other forms of systemic lupus erythematosus Wound Cleansing Wound #1 Left,Anterior Lower Leg o Dial antibacterial soap, wash wounds, rinse and pat dry prior to dressing wounds Wound #2 Right,Lateral Lower Leg o Dial antibacterial soap, wash wounds, rinse and pat dry prior to dressing wounds Skin Barriers/Peri-Wound Care Wound #1 Left,Anterior Lower Leg o Barrier cream - Zinc oxide Wound #2 Right,Lateral Lower Leg o Barrier cream - Zinc oxide Primary Wound Dressing Wound #1 Left,Anterior Lower Leg o Hydrafera Blue Ready Transfer Wound #2 Right,Lateral Lower Leg o Hydrafera Blue Ready Transfer Secondary Dressing Wound #1 Left,Anterior Lower Leg o ABD pad Wound #2 Right,Lateral Lower Leg o ABD pad Roache, Kasie B. ([id number]) Dressing Change Frequency Wound #1 Left,Anterior Lower Leg o Change Dressing Monday, Wednesday, Friday - Mondays-dressing changes in clinic. Wednesdays and Fridays- [place] will do dressing changes Wound #2 Right,Lateral Lower Leg o Change Dressing Monday, Wednesday, Friday - Mondays-dressing changes in clinic. Wednesdays and Fridays- [place] will do dressing changes Follow-up Appointments Wound #1 Left,Anterior Lower Leg o Return Appointment in 2 weeks. - Monday September 14th Wound #2 Right,Lateral Lower Leg o Return Appointment in 2 weeks. - Monday September 14th Edema Control Wound #1 Left,Anterior Lower Leg o Other: - Farrow 4000 Wraps Wound #2 Right,Lateral Lower Leg o Other: - Farrow 4000 Wraps Off-Loading Wound #1 Left,Anterior Lower Leg o Other: - Elevate legs when sitting Wound #2 Right,Lateral Lower Leg o Other: - Elevate legs  when sitting Home Health Wound #1 [place] for [place] to visit Next Monday September 7th (clinic closed due to holiday) o [place] Wednesday and Fridays. Pleasechange the [facility] Nurse may visit PRN to address patientos wound care needs. o FACE TO FACE ENCOUNTER: MEDICARE and MEDICAID PATIENTS: I certify that this patient is under my care and that I had  Deborra Medina Treating Provider/Extender: Melburn Hake, Talal Fritchman Weeks in Treatment: 3 Constitutional Well-nourished and well-hydrated in no acute distress. Respiratory normal breathing without difficulty. clear to auscultation bilaterally. Cardiovascular regular rate and rhythm with normal S1, S2. Psychiatric this patient is able to make decisions and demonstrates good insight into disease process. Alert and Oriented x 3. pleasant and cooperative. Notes Patient's wounds currently all show to be measuring smaller there is good granulation and epithelialization noted at this time. Overall I am extremely pleased she is still having some pain but fortunately nothing as significant as what she has had previous. She still is tolerating the Farrow wraps without any complication. Electronic Signature(s) Signed: 05/20/2019 1:08:48 PM By: Worthy Keeler PA-C Entered By: Worthy Keeler on 05/20/2019 13:08:48 [name] -------------------------------------------------------------------------------- Physician Orders Details Patient Name: Diana Bene B. Date of Service: 05/20/2019 12:30 PM Medical Record Number: [id number] Patient Account Number: [id number] Date of Birth/Sex: 23-Dec-1934 (84 y.o. F) Treating RN: Harold Barban Primary Care Provider: Deborra Medina Other Clinician: Referring Provider: Deborra Medina Treating Provider/Extender: Melburn Hake, Yara Tomkinson Weeks in Treatment: 3 Verbal / Phone Orders: No Diagnosis Coding ICD-10 Coding Code Description I89.0 Lymphedema, not elsewhere classified L97.812 Non-pressure chronic ulcer of other part of right lower leg with fat layer exposed L97.822 Non-pressure chronic ulcer of other part of left lower leg with fat layer  exposed I50.42 Chronic combined systolic (congestive) and diastolic (congestive) heart failure N18.3 Chronic kidney disease, stage 3 (moderate) D50.8 Other iron deficiency anemias I10 Essential (primary) hypertension Z79.01 Long term (current) use of anticoagulants M32.8 Other forms of systemic lupus erythematosus Wound Cleansing Wound #1 Left,Anterior Lower Leg o Dial antibacterial soap, wash wounds, rinse and pat dry prior to dressing wounds Wound #2 Right,Lateral Lower Leg o Dial antibacterial soap, wash wounds, rinse and pat dry prior to dressing wounds Skin Barriers/Peri-Wound Care Wound #1 Left,Anterior Lower Leg o Barrier cream - Zinc oxide Wound #2 Right,Lateral Lower Leg o Barrier cream - Zinc oxide Primary Wound Dressing Wound #1 Left,Anterior Lower Leg o Hydrafera Blue Ready Transfer Wound #2 Right,Lateral Lower Leg o Hydrafera Blue Ready Transfer Secondary Dressing Wound #1 Left,Anterior Lower Leg o ABD pad Wound #2 Right,Lateral Lower Leg o ABD pad Roache, Kasie B. ([id number]) Dressing Change Frequency Wound #1 Left,Anterior Lower Leg o Change Dressing Monday, Wednesday, Friday - Mondays-dressing changes in clinic. Wednesdays and Fridays- [place] will do dressing changes Wound #2 Right,Lateral Lower Leg o Change Dressing Monday, Wednesday, Friday - Mondays-dressing changes in clinic. Wednesdays and Fridays- [place] will do dressing changes Follow-up Appointments Wound #1 Left,Anterior Lower Leg o Return Appointment in 2 weeks. - Monday September 14th Wound #2 Right,Lateral Lower Leg o Return Appointment in 2 weeks. - Monday September 14th Edema Control Wound #1 Left,Anterior Lower Leg o Other: - Farrow 4000 Wraps Wound #2 Right,Lateral Lower Leg o Other: - Farrow 4000 Wraps Off-Loading Wound #1 Left,Anterior Lower Leg o Other: - Elevate legs when sitting Wound #2 Right,Lateral Lower Leg o Other: - Elevate legs  when sitting Home Health Wound #1 [place] [facility] [place] to visit Next Monday September 7th (clinic closed due to holiday) o [place] Wednesday and Fridays. Pleasechange the [facility] Nurse may visit PRN to address patientos wound care needs. o FACE TO FACE ENCOUNTER: MEDICARE and MEDICAID PATIENTS: I certify that this patient is under my care and that I had  Deborra Medina Treating Provider/Extender: Melburn Hake, Talal Fritchman Weeks in Treatment: 3 Constitutional Well-nourished and well-hydrated in no acute distress. Respiratory normal breathing without difficulty. clear to auscultation bilaterally. Cardiovascular regular rate and rhythm with normal S1, S2. Psychiatric this patient is able to make decisions and demonstrates good insight into disease process. Alert and Oriented x 3. pleasant and cooperative. Notes Patient's wounds currently all show to be measuring smaller there is good granulation and epithelialization noted at this time. Overall I am extremely pleased she is still having some pain but fortunately nothing as significant as what she has had previous. She still is tolerating the [place] wraps without any complication. Electronic Signature(s) Signed: 05/20/2019 1:08:48 PM By: Worthy Keeler PA-C Entered By: Worthy Keeler on 05/20/2019 13:08:48 [name] -------------------------------------------------------------------------------- Physician Orders Details Patient Name: Diana Bene B. Date of Service: 05/20/2019 12:30 PM Medical Record Number: [id number] Patient Account Number: [id number] Date of Birth/Sex: 23-Dec-1934 (84 y.o. F) Treating RN: Harold Barban Primary Care Provider: Deborra Medina Other Clinician: Referring Provider: Deborra Medina Treating Provider/Extender: Melburn Hake, Yara Tomkinson Weeks in Treatment: 3 Verbal / Phone Orders: No Diagnosis Coding ICD-10 Coding Code Description I89.0 Lymphedema, not elsewhere classified L97.812 Non-pressure chronic ulcer of other part of right lower leg with fat layer exposed L97.822 Non-pressure chronic ulcer of other part of left lower leg with fat layer  exposed I50.42 Chronic combined systolic (congestive) and diastolic (congestive) heart failure N18.3 Chronic kidney disease, stage 3 (moderate) D50.8 Other iron deficiency anemias I10 Essential (primary) hypertension Z79.01 Long term (current) use of anticoagulants M32.8 Other forms of systemic lupus erythematosus Wound Cleansing Wound #1 Left,Anterior Lower Leg o Dial antibacterial soap, wash wounds, rinse and pat dry prior to dressing wounds Wound #2 Right,Lateral Lower Leg o Dial antibacterial soap, wash wounds, rinse and pat dry prior to dressing wounds Skin Barriers/Peri-Wound Care Wound #1 Left,Anterior Lower Leg o Barrier cream - Zinc oxide Wound #2 Right,Lateral Lower Leg o Barrier cream - Zinc oxide Primary Wound Dressing Wound #1 Left,Anterior Lower Leg o Hydrafera Blue Ready Transfer Wound #2 Right,Lateral Lower Leg o Hydrafera Blue Ready Transfer Secondary Dressing Wound #1 Left,Anterior Lower Leg o ABD pad Wound #2 Right,Lateral Lower Leg o ABD pad Roache, Kasie B. ([id number]) Dressing Change Frequency Wound #1 Left,Anterior Lower Leg o Change Dressing Monday, Wednesday, Friday - Mondays-dressing changes in clinic. Wednesdays and Fridays- [place] will do dressing changes Wound #2 Right,Lateral Lower Leg o Change Dressing Monday, Wednesday, Friday - Mondays-dressing changes in clinic. Wednesdays and Fridays- [place] will do dressing changes Follow-up Appointments Wound #1 Left,Anterior Lower Leg o Return Appointment in 2 weeks. - Monday September 14th Wound #2 Right,Lateral Lower Leg o Return Appointment in 2 weeks. - Monday September 14th Edema Control Wound #1 Left,Anterior Lower Leg o Other: - Farrow 4000 Wraps Wound #2 Right,Lateral Lower Leg o Other: - Farrow 4000 Wraps Off-Loading Wound #1 Left,Anterior Lower Leg o Other: - Elevate legs when sitting Wound #2 Right,Lateral Lower Leg o Other: - Elevate legs  when sitting Home Health Wound #1 [place] [place] to visit Next Monday September 7th (clinic closed due to holiday) o [place] Wednesday and Fridays. Pleasechange the [facility] Nurse may visit PRN to address patientos wound care needs. o FACE TO FACE ENCOUNTER: MEDICARE and MEDICAID PATIENTS: I certify that this patient is under my care and that I had  Diana, [name] ([id number]) Visit Report for 05/20/2019 Chief Complaint Document Details Patient Name: [name], [name] Date of Service: 05/20/2019 12:30 PM Medical Record Number: [id number] Patient Account Number: [id number] Date of Birth/Sex: October 26, 1934 (84 y.o. F) Treating RN: Harold Barban Primary Care Provider: Deborra Medina Other Clinician: Referring Provider: Deborra Medina Treating Provider/Extender: Melburn Hake, Arien Morine Weeks in Treatment: 3 Information Obtained from: Patient Chief Complaint Bilateral LE Ulcers and Severe Bilateral Lymphedema Electronic Signature(s) Signed: 05/20/2019 12:48:29 PM By: Worthy Keeler PA-C Entered By: Worthy Keeler on 05/20/2019 12:48:29 Diana [name], Diana [name] Diana [name] ([id number]) -------------------------------------------------------------------------------- HPI Details Patient Name: Diana Bene B. Date of Service: 05/20/2019 12:30 PM Medical Record Number: [id number] Patient Account Number: [id number] Date of Birth/Sex: Apr 11, 1935 (84 y.o. F) Treating RN: Harold Barban Primary Care Provider: Deborra Medina Other Clinician: Referring Provider: Deborra Medina Treating Provider/Extender: Melburn Hake, Saryiah Bencosme Weeks in Treatment: 3 History of Present Illness HPI Description: 04/29/2019 upon evaluation today patient presents for initial inspection her clinic concerning issues that she has been having with bilateral lower extremity edema for quite some time. She has unfortunately an extensive medical history which includes lymphedema, congestive heart failure, chronic kidney disease stage III which is trending towards stage IV, iron deficiency anemia, hypertension, and lupus. She is also on long-term anticoagulation therapy with Coumadin as well as Lasix. With that being said currently today she did have an injury where she sustained a cut to the back of her right leg which apparently there was a very difficult time getting this to completely healed. Fortunately it  is still healing and seems to be doing okay nonetheless she had a lot of bleeding and again that is what they really had a sufficiently difficult time getting a stop when she was seen in the emergency department. Fortunately there is no signs of active infection at this time which is good news. She states that Dr. Derrel Nip her primary care provider sent her here today for compression therapy. That is where the majority of our conversation was directed today. 05/06/2019 upon evaluation today patient appears to be doing very well with regard to the wounds on her bilateral lower extremities other than the fact that they seem to be somewhat dry with some of the Prisma sticking to the wound bed. With that being said think that she may do better with a different type of dressing to try to help this heal more appropriately and effectively. Fortunately there is no signs of active infection at this time she does have some discomfort however despite the fact that the wounds are not looking too poorly. 05/13/2019 on evaluation today patient appears to be doing very well in my opinion regarding her lower extremity ulcers. She has been tolerating the dressing changes without complication. Fortunately there is no signs of active infection at this time. No fevers, chills, nausea, vomiting, or diarrhea. 05/20/2019 on evaluation today patient appears to be doing better with regard to her lower extremity ulcers. She has been tolerating the dressing changes without complication. Fortunately there is no signs of active infection at this time. No fevers, chills, nausea, vomiting, or diarrhea. Electronic Signature(s) Signed: 05/20/2019 1:08:13 PM By: Worthy Keeler PA-C Entered By: Worthy Keeler on 05/20/2019 13:08:12 [name], [name] -------------------------------------------------------------------------------- Physical Exam Details Patient Name: [name], [name] Date of Service: 05/20/2019 12:30  PM Medical Record Number: [id number] Patient Account Number: [id number] Date of Birth/Sex: 1934/10/04 (84 y.o. F) Treating RN: Harold Barban Primary Care Provider: Deborra Medina Other Clinician: Referring Provider:

## 2019-05-20 NOTE — Progress Notes (Signed)
Pt had to go to wound clinic today then here. She did not drink very much. She has wounds and edema on lower ext. Pain right hip and leg # 8.

## 2019-05-21 ENCOUNTER — Telehealth: Payer: Self-pay | Admitting: *Deleted

## 2019-05-21 ENCOUNTER — Telehealth: Payer: Self-pay | Admitting: Internal Medicine

## 2019-05-21 DIAGNOSIS — Z8781 Personal history of (healed) traumatic fracture: Secondary | ICD-10-CM

## 2019-05-21 NOTE — Telephone Encounter (Signed)
Spoke with pt's son per Dr. Derrel Nip to explain the difference between palliative care and hospice because she does not think that the pt is in the last 6 months of life. Son stated that he does not think that either that she just needs a little extra care during the day. The son is wanting to see about having someone come out for a couple of hours to help she bathe and eat lunch. He stated that he is there to help with the rest. Randall Hiss with [facility] is not the home health person or company that already comes out there. Pt has a nurse from [place] that comes out once a week for 30 minutes to bathe her. I explained palliative care to him and he stated that he felt like that was more of what she really needed.

## 2019-05-21 NOTE — Telephone Encounter (Signed)
Pt was feeling poorly yesterday and she was not able to get blood work so she did not get injection. She wanted to go home, she had been here a while trying to get blood from her and then seeing Dr. Janese Banks. I told her that I would call her with appt and then mail it out to her. The first appt 9/14at 2:30 for labs and then the inj. It is on same day as the wound clinic. She said thank you and when she gets the schedule she will not forget  About the appt

## 2019-05-21 NOTE — Progress Notes (Signed)
Status Wound Number: 1 Primary Lymphedema Etiology: Wound Location: Left Lower Leg - Anterior Secondary Diabetic Wound/Ulcer of the Lower Extremity Wounding Event: Gradually Appeared Etiology: Date Acquired: 03/18/2019 Wound Open Weeks Of Treatment: 3 Status: Clustered Wound: No Comorbid Cataracts, Chronic sinus  problems/congestion, History: Anemia, Congestive Heart Failure, Type II Diabetes, End Stage Renal Disease, Lupus Erythematosus, Neuropathy, Received Radiation Photos Wound Measurements Length: (cm) 0.7 Width: (cm) 1 Depth: (cm) 0.1 Area: (cm) 0.55 Volume: (cm) 0.055 % Reduction in Area: 84.4% % Reduction in Volume: 84.4% Epithelialization: Small (1-33%) Tunneling: No Undermining: No Wound Description Full Thickness Without Exposed Support Foul Od Classification: Structures Slough/ Wound Margin: Indistinct, nonvisible Exudate Medium Amount: Exudate Type: Serous Exudate Color: amber or After Cleansing: No Fibrino Yes Wound Bed Granulation Amount: Large (67-100%) Exposed Structure Granulation Quality: Pink Fascia Exposed: No Necrotic Amount: Small (1-33%) Fat Layer (Subcutaneous Tissue) Exposed: Yes Necrotic Quality: Adherent Slough Tendon Exposed: No Muscle Exposed: No [name], [name] ([id number]) Joint Exposed: No Bone Exposed: No Treatment Notes Wound #1 (Left, Anterior Lower Leg) Notes Zinc oxide, Hblue, ABD, conform, Own wraps Electronic Signature(s) Signed: 05/20/2019 3:47:55 PM By: Montey Hora Entered By: Montey Hora on 05/20/2019 12:54:47 [name], [name] ([id number]) -------------------------------------------------------------------------------- Wound Assessment Details Patient Name: [name], [name] Date of Service: 05/20/2019 12:30 PM Medical Record Number: [id number] Patient Account Number: [id number] Date of Birth/Sex: 12/01/1934 (84 y.o. F) Treating RN: Montey Hora Primary Care Braxdon Gappa: Deborra Medina Other Clinician: Referring Lucynda Rosano: Deborra Medina Treating Oden Lindaman/Extender: Melburn Hake, HOYT Weeks in Treatment: 3 Wound Status Wound Number: 2 Primary Trauma, Other Etiology: Wound Location: Right Lower Leg - Lateral Secondary Lymphedema Wounding Event: Gradually Appeared Etiology: Date Acquired: 04/26/2019 Wound Open Weeks Of  Treatment: 3 Status: Clustered Wound: No Comorbid Cataracts, Chronic sinus problems/congestion, History: Anemia, Congestive Heart Failure, Type II Diabetes, End Stage Renal Disease, Lupus Erythematosus, Neuropathy, Received Radiation Photos Wound Measurements Length: (cm) 0.4 Width: (cm) 0.4 Depth: (cm) 0.1 Area: (cm) 0.126 Volume: (cm) 0.013 % Reduction in Area: 79.9% % Reduction in Volume: 93.1% Epithelialization: Medium (34-66%) Tunneling: No Undermining: No Wound Description Full Thickness Without Exposed Support Classification: Structures Wound Margin: Flat and Intact Exudate Medium Amount: Exudate Type: Serous Exudate Color: amber Foul Odor After Cleansing: No Slough/Fibrino Yes Wound Bed Granulation Amount: Large (67-100%) Exposed Structure Granulation Quality: Pink Fascia Exposed: No Necrotic Amount: Small (1-33%) Fat Layer (Subcutaneous Tissue) Exposed: Yes Necrotic Quality: Adherent Slough Tendon Exposed: No Muscle Exposed: No [name], [name] ([id number]) Joint Exposed: No Bone Exposed: No Treatment Notes Wound #2 (Right, Lateral Lower Leg) Notes Zinc oxide, Hblue, ABD, conform, Own wraps Electronic Signature(s) Signed: 05/20/2019 3:47:55 PM By: Montey Hora Entered By: Montey Hora on 05/20/2019 12:55:28 [name], [name] ([id number]) -------------------------------------------------------------------------------- Vitals Details Patient Name: [name], [name] Date of Service: 05/20/2019 12:30 PM Medical Record Number: [id number] Patient Account Number: [id number] Date of Birth/Sex: 08/04/1935 (84 y.o. F) Treating RN: Montey Hora Primary Care Doral Digangi: Deborra Medina Other Clinician: Referring Kannon Baum: Deborra Medina Treating Avriana Joo/Extender: Melburn Hake, HOYT Weeks in Treatment: 3 Vital Signs Time Taken: 12:45 Temperature (F): 98.3 Height (in): 60 Pulse (bpm): 102 Weight (lbs): 106 Respiratory Rate (breaths/min): 18 Body Mass  Index (BMI): 20.7 Blood Pressure (mmHg): 95/54 Reference Range: 80 - 120 mg / dl Electronic Signature(s) Signed: 05/20/2019 3:47:55 PM By: Montey Hora Entered By: Montey Hora on 05/20/2019 12:46:03  Status Wound Number: 1 Primary Lymphedema Etiology: Wound Location: Left Lower Leg - Anterior Secondary Diabetic Wound/Ulcer of the Lower Extremity Wounding Event: Gradually Appeared Etiology: Date Acquired: 03/18/2019 Wound Open Weeks Of Treatment: 3 Status: Clustered Wound: No Comorbid Cataracts, Chronic sinus  problems/congestion, History: Anemia, Congestive Heart Failure, Type II Diabetes, End Stage Renal Disease, Lupus Erythematosus, Neuropathy, Received Radiation Photos Wound Measurements Length: (cm) 0.7 Width: (cm) 1 Depth: (cm) 0.1 Area: (cm) 0.55 Volume: (cm) 0.055 % Reduction in Area: 84.4% % Reduction in Volume: 84.4% Epithelialization: Small (1-33%) Tunneling: No Undermining: No Wound Description Full Thickness Without Exposed Support Foul Od Classification: Structures Slough/ Wound Margin: Indistinct, nonvisible Exudate Medium Amount: Exudate Type: Serous Exudate Color: amber or After Cleansing: No Fibrino Yes Wound Bed Granulation Amount: Large (67-100%) Exposed Structure Granulation Quality: Pink Fascia Exposed: No Necrotic Amount: Small (1-33%) Fat Layer (Subcutaneous Tissue) Exposed: Yes Necrotic Quality: Adherent Slough Tendon Exposed: No Muscle Exposed: No [name], [name] ([id number]) Joint Exposed: No Bone Exposed: No Treatment Notes Wound #1 (Left, Anterior Lower Leg) Notes Zinc oxide, Hblue, ABD, conform, Own wraps Electronic Signature(s) Signed: 05/20/2019 3:47:55 PM By: Montey Hora Entered By: Montey Hora on 05/20/2019 12:54:47 [name], [name] ([id number]) -------------------------------------------------------------------------------- Wound Assessment Details Patient Name: [name], [name] Date of Service: 05/20/2019 12:30 PM Medical Record Number: [id number] Patient Account Number: [id number] Date of Birth/Sex: 12/01/1934 (84 y.o. F) Treating RN: Montey Hora Primary Care Braxdon Gappa: Deborra Medina Other Clinician: Referring Lucynda Rosano: Deborra Medina Treating Oden Lindaman/Extender: Melburn Hake, HOYT Weeks in Treatment: 3 Wound Status Wound Number: 2 Primary Trauma, Other Etiology: Wound Location: Right Lower Leg - Lateral Secondary Lymphedema Wounding Event: Gradually Appeared Etiology: Date Acquired: 04/26/2019 Wound Open Weeks Of  Treatment: 3 Status: Clustered Wound: No Comorbid Cataracts, Chronic sinus problems/congestion, History: Anemia, Congestive Heart Failure, Type II Diabetes, End Stage Renal Disease, Lupus Erythematosus, Neuropathy, Received Radiation Photos Wound Measurements Length: (cm) 0.4 Width: (cm) 0.4 Depth: (cm) 0.1 Area: (cm) 0.126 Volume: (cm) 0.013 % Reduction in Area: 79.9% % Reduction in Volume: 93.1% Epithelialization: Medium (34-66%) Tunneling: No Undermining: No Wound Description Full Thickness Without Exposed Support Classification: Structures Wound Margin: Flat and Intact Exudate Medium Amount: Exudate Type: Serous Exudate Color: amber Foul Odor After Cleansing: No Slough/Fibrino Yes Wound Bed Granulation Amount: Large (67-100%) Exposed Structure Granulation Quality: Pink Fascia Exposed: No Necrotic Amount: Small (1-33%) Fat Layer (Subcutaneous Tissue) Exposed: Yes Necrotic Quality: Adherent Slough Tendon Exposed: No Muscle Exposed: No [name], [name] ([id number]) Joint Exposed: No Bone Exposed: No Treatment Notes Wound #2 (Right, Lateral Lower Leg) Notes Zinc oxide, Hblue, ABD, conform, Own wraps Electronic Signature(s) Signed: 05/20/2019 3:47:55 PM By: Montey Hora Entered By: Montey Hora on 05/20/2019 12:55:28 [name], [name] ([id number]) -------------------------------------------------------------------------------- Vitals Details Patient Name: [name], [name] Date of Service: 05/20/2019 12:30 PM Medical Record Number: [id number] Patient Account Number: [id number] Date of Birth/Sex: 08/04/1935 (84 y.o. F) Treating RN: Montey Hora Primary Care Doral Digangi: Deborra Medina Other Clinician: Referring Kannon Baum: Deborra Medina Treating Avriana Joo/Extender: Melburn Hake, HOYT Weeks in Treatment: 3 Vital Signs Time Taken: 12:45 Temperature (F): 98.3 Height (in): 60 Pulse (bpm): 102 Weight (lbs): 106 Respiratory Rate (breaths/min): 18 Body Mass  Index (BMI): 20.7 Blood Pressure (mmHg): 95/54 Reference Range: 80 - 120 mg / dl Electronic Signature(s) Signed: 05/20/2019 3:47:55 PM By: Montey Hora Entered By: Montey Hora on 05/20/2019 12:46:03  Diana [name], Diana [name] ([id number]) Visit Report for 05/20/2019 Arrival Information Details Patient Name: TAMESHIA, BONNEVILLE B. Date of Service: 05/20/2019 12:30 PM Medical Record Number: [id number] Patient Account Number: [id number] Date of Birth/Sex: December 31, 1934 (84 y.o. F) Treating RN: Montey Hora Primary Care Shaterica Mcclatchy: Deborra Medina Other Clinician: Referring Dell Briner: Deborra Medina Treating Mikhael Hendriks/Extender: Melburn Hake, HOYT Weeks in Treatment: 3 Visit Information History Since Last Visit Added or deleted any medications: No Patient Arrived: Wheel Chair Any new allergies or adverse reactions: No Arrival Time: 12:43 Had a fall or experienced change in No Accompanied By: sister activities of daily living that may affect Transfer Assistance: Manual risk of falls: Patient Identification Verified: Yes Signs or symptoms of abuse/neglect since last visito No Secondary Verification Process Yes Hospitalized since last visit: No Completed: Implantable device outside of the clinic excluding No Patient Requires Transmission-Based No cellular tissue based products placed in the center Precautions: since last visit: Patient Has Alerts: Yes Has Dressing in Place as Prescribed: Yes Patient Alerts: Patient on Blood Has Compression in Place as Prescribed: Yes Thinner Pain Present Now: No Warfarin Type II Diabetic Electronic Signature(s) Signed: 05/20/2019 3:47:55 PM By: Montey Hora Entered By: Montey Hora on 05/20/2019 12:45:03 Diana [name], Diana [name] Kitchen ([id number]) -------------------------------------------------------------------------------- Clinic Level of Care Assessment Details Patient Name: [name] Date of Service: 05/20/2019 12:30 PM Medical Record Number: [id number] Patient Account Number: [id number] Date of Birth/Sex: 06/08/1935 (84 y.o. F) Treating RN: Harold Barban Primary Care Jaylyn Booher: Deborra Medina Other Clinician: Referring Dasja Brase: Deborra Medina Treating  Pryce Folts/Extender: Melburn Hake, HOYT Weeks in Treatment: 3 Clinic Level of Care Assessment Items TOOL 4 Quantity Score []  - Use when only an EandM is performed on FOLLOW-UP visit 0 ASSESSMENTS - Nursing Assessment / Reassessment X - Reassessment of Co-morbidities (includes updates in patient status) 1 10 X- 1 5 Reassessment of Adherence to Treatment Plan ASSESSMENTS - Wound and Skin Assessment / Reassessment []  - Simple Wound Assessment / Reassessment - one wound 0 X- 2 5 Complex Wound Assessment / Reassessment - multiple wounds []  - 0 Dermatologic / Skin Assessment (not related to wound area) ASSESSMENTS - Focused Assessment []  - Circumferential Edema Measurements - multi extremities 0 []  - 0 Nutritional Assessment / Counseling / Intervention []  - 0 Lower Extremity Assessment (monofilament, tuning fork, pulses) []  - 0 Peripheral Arterial Disease Assessment (using hand held doppler) ASSESSMENTS - Ostomy and/or Continence Assessment and Care []  - Incontinence Assessment and Management 0 []  - 0 Ostomy Care Assessment and Management (repouching, etc.) PROCESS - Coordination of Care X - Simple Patient / Family Education for ongoing care 1 15 []  - 0 Complex (extensive) Patient / Family Education for ongoing care []  - 0 Staff obtains Programmer, systems, Records, Test Results / Process Orders X- 1 10 Staff telephones HHA, Nursing Homes / Clarify orders / etc []  - 0 Routine Transfer to another Facility (non-emergent condition) []  - 0 Routine Hospital Admission (non-emergent condition) []  - 0 New Admissions / Biomedical engineer / Ordering NPWT, Apligraf, etc. []  - 0 Emergency Hospital Admission (emergent condition) X- 1 10 Simple Discharge Coordination Dewberry, Maryse B. ([id number]) []  - 0 Complex (extensive) Discharge Coordination PROCESS - Special Needs []  - Pediatric / Minor Patient Management 0 []  - 0 Isolation Patient Management []  - 0 Hearing / Language / Visual special  needs []  - 0 Assessment of Community assistance (transportation, D/C planning, etc.) []  - 0 Additional assistance / Altered mentation []  - 0 Support Surface(s) Assessment (bed, cushion, seat, etc.) INTERVENTIONS - Wound Cleansing /  Status Wound Number: 1 Primary Lymphedema Etiology: Wound Location: Left Lower Leg - Anterior Secondary Diabetic Wound/Ulcer of the Lower Extremity Wounding Event: Gradually Appeared Etiology: Date Acquired: 03/18/2019 Wound Open Weeks Of Treatment: 3 Status: Clustered Wound: No Comorbid Cataracts, Chronic sinus  problems/congestion, History: Anemia, Congestive Heart Failure, Type II Diabetes, End Stage Renal Disease, Lupus Erythematosus, Neuropathy, Received Radiation Photos Wound Measurements Length: (cm) 0.7 Width: (cm) 1 Depth: (cm) 0.1 Area: (cm) 0.55 Volume: (cm) 0.055 % Reduction in Area: 84.4% % Reduction in Volume: 84.4% Epithelialization: Small (1-33%) Tunneling: No Undermining: No Wound Description Full Thickness Without Exposed Support Foul Od Classification: Structures Slough/ Wound Margin: Indistinct, nonvisible Exudate Medium Amount: Exudate Type: Serous Exudate Color: amber or After Cleansing: No Fibrino Yes Wound Bed Granulation Amount: Large (67-100%) Exposed Structure Granulation Quality: Pink Fascia Exposed: No Necrotic Amount: Small (1-33%) Fat Layer (Subcutaneous Tissue) Exposed: Yes Necrotic Quality: Adherent Slough Tendon Exposed: No Muscle Exposed: No [name], [name] ([id number]) Joint Exposed: No Bone Exposed: No Treatment Notes Wound #1 (Left, Anterior Lower Leg) Notes Zinc oxide, Hblue, ABD, conform, Own wraps Electronic Signature(s) Signed: 05/20/2019 3:47:55 PM By: Montey Hora Entered By: Montey Hora on 05/20/2019 12:54:47 [name], [name] ([id number]) -------------------------------------------------------------------------------- Wound Assessment Details Patient Name: [name], [name] Date of Service: 05/20/2019 12:30 PM Medical Record Number: [id number] Patient Account Number: [id number] Date of Birth/Sex: 12/01/1934 (84 y.o. F) Treating RN: Montey Hora Primary Care Braxdon Gappa: Deborra Medina Other Clinician: Referring Lucynda Rosano: Deborra Medina Treating Oden Lindaman/Extender: Melburn Hake, HOYT Weeks in Treatment: 3 Wound Status Wound Number: 2 Primary Trauma, Other Etiology: Wound Location: Right Lower Leg - Lateral Secondary Lymphedema Wounding Event: Gradually Appeared Etiology: Date Acquired: 04/26/2019 Wound Open Weeks Of  Treatment: 3 Status: Clustered Wound: No Comorbid Cataracts, Chronic sinus problems/congestion, History: Anemia, Congestive Heart Failure, Type II Diabetes, End Stage Renal Disease, Lupus Erythematosus, Neuropathy, Received Radiation Photos Wound Measurements Length: (cm) 0.4 Width: (cm) 0.4 Depth: (cm) 0.1 Area: (cm) 0.126 Volume: (cm) 0.013 % Reduction in Area: 79.9% % Reduction in Volume: 93.1% Epithelialization: Medium (34-66%) Tunneling: No Undermining: No Wound Description Full Thickness Without Exposed Support Classification: Structures Wound Margin: Flat and Intact Exudate Medium Amount: Exudate Type: Serous Exudate Color: amber Foul Odor After Cleansing: No Slough/Fibrino Yes Wound Bed Granulation Amount: Large (67-100%) Exposed Structure Granulation Quality: Pink Fascia Exposed: No Necrotic Amount: Small (1-33%) Fat Layer (Subcutaneous Tissue) Exposed: Yes Necrotic Quality: Adherent Slough Tendon Exposed: No Muscle Exposed: No [name], [name] ([id number]) Joint Exposed: No Bone Exposed: No Treatment Notes Wound #2 (Right, Lateral Lower Leg) Notes Zinc oxide, Hblue, ABD, conform, Own wraps Electronic Signature(s) Signed: 05/20/2019 3:47:55 PM By: Montey Hora Entered By: Montey Hora on 05/20/2019 12:55:28 [name], [name] ([id number]) -------------------------------------------------------------------------------- Vitals Details Patient Name: [name], [name] Date of Service: 05/20/2019 12:30 PM Medical Record Number: [id number] Patient Account Number: [id number] Date of Birth/Sex: 08/04/1935 (84 y.o. F) Treating RN: Montey Hora Primary Care Doral Digangi: Deborra Medina Other Clinician: Referring Kannon Baum: Deborra Medina Treating Avriana Joo/Extender: Melburn Hake, HOYT Weeks in Treatment: 3 Vital Signs Time Taken: 12:45 Temperature (F): 98.3 Height (in): 60 Pulse (bpm): 102 Weight (lbs): 106 Respiratory Rate (breaths/min): 18 Body Mass  Index (BMI): 20.7 Blood Pressure (mmHg): 95/54 Reference Range: 80 - 120 mg / dl Electronic Signature(s) Signed: 05/20/2019 3:47:55 PM By: Montey Hora Entered By: Montey Hora on 05/20/2019 12:46:03

## 2019-05-21 NOTE — Telephone Encounter (Signed)
Helping with a bath and helping with lunchtime  Meal is unfortunately not what Palliative care typically does either!  It sounds like he needs an aide Clinical cytogeneticist" type of aide) for a couple of hours daily . Which insurance may not pay for unless she has long term health care insurance . I'll see if advance can offer an aide for a few hours per day

## 2019-05-21 NOTE — Telephone Encounter (Signed)
Eric from [employer] called stating patients son called requesting if patient could get higher level of care. Randall Hiss would like to do an evaule for hospices  Randall Hiss call back [phone]

## 2019-05-22 ENCOUNTER — Institutional Professional Consult (permissible substitution): Admit: 2019-05-22 | Discharge: 2019-05-23 | Payer: MEDICARE

## 2019-05-22 ENCOUNTER — Telehealth: Payer: Self-pay

## 2019-05-22 DIAGNOSIS — M3214 Glomerular disease in systemic lupus erythematosus: Secondary | ICD-10-CM

## 2019-05-22 DIAGNOSIS — Z79899 Other long term (current) drug therapy: Secondary | ICD-10-CM

## 2019-05-22 DIAGNOSIS — L930 Discoid lupus erythematosus: Secondary | ICD-10-CM

## 2019-05-22 DIAGNOSIS — M818 Other osteoporosis without current pathological fracture: Secondary | ICD-10-CM | POA: Diagnosis not present

## 2019-05-22 DIAGNOSIS — R1310 Dysphagia, unspecified: Secondary | ICD-10-CM | POA: Diagnosis not present

## 2019-05-22 DIAGNOSIS — R110 Nausea: Secondary | ICD-10-CM | POA: Diagnosis not present

## 2019-05-22 LAB — POCT INR: INR: 2.2 — AB (ref 0.9–1.1)

## 2019-05-22 MED ORDER — MYCOPHENOLATE SODIUM 180 MG TABLET,DELAYED RELEASE
ORAL_TABLET | Freq: Two times a day (BID) | ORAL | 3 refills | 30 days | Status: CP
Start: 2019-05-22 — End: 2019-06-18
  Filled 2019-05-23: qty 120, 30d supply, fill #0

## 2019-05-22 MED ORDER — PREDNISONE 5 MG TABLET
ORAL_TABLET | Freq: Every day | ORAL | 5 refills | 30 days | Status: CP
Start: 2019-05-22 — End: ?

## 2019-05-22 NOTE — Telephone Encounter (Signed)
Randall Hiss called back in to be advised. Advised per PCP. Randall Hiss says that PCP plan sounds good to him. He just overall want to make sure that pt get the proper assistance that she need.

## 2019-05-22 NOTE — Unmapped (Signed)
[facility] Telephone Visit     Assessment/Plan:   84 y.o. woman with a history of SLE characterized by + ANA, +dsDNA, and hypocomplementemia, sicca symptoms, possible SLE-associated-pulmonary HTN (follows with Pulm HTN clinic, stable on tadalafil), remote focal proliferative LN (dx 1992, follows with nephrology), breast cancer (stage I (T1 C. N0 M0) invasive mammary carcinoma, ER/PR positive HER-2/neu negative, dx 2015; follows with oncology in [place]) and h/o DVT who presents for follow-up of her lupus.  She also has h/o gastric volvulus requiring emergent gastropexy as well as esophageal dysmotility and dysphagia requiring PEG tube (placed 03/2015; now removed), H. Pylori (s/p tx) and CMV esophagitis (s/p tx) for which she follows with GI in [place].     She was last seen 12/2018 by Telemedicine    In interim, patient doing well regarding her SLE. She notes difficulty with her cellcept due to difficulty swallowing and GI upset. She notes that she is currently having trouble with it making her feel nauseated.  Otherwise, no concerning symptoms.  Labs from 02/2019 in [facility] reviewed and reassuring overall.     1. SLE:    - Update monitoring labs; will fax orders to PCP : CBC w diff, LFTs, and Cr stable; dsDNA, c3, c4 UA and UPC  - Given GI distress with cellcept, will try to switch to myfortic;  Ordered myfortic 360 mg BID and sent to [facility] shared services pharmacy;  Once approved she is to stop the cellcept and start myfortic in its place  - Continue prednisone 5 mg daily, taper below this level has resulted in flare of arthralgia/fatigue.  - Continue Plaquenil 200 mg daily, last eye exam 01/08/18 (no evidence of plaquenil eye toxicity); she is aware   - Follow-up with pulm HTN clinic [facility]; last seen 02/2019), nephrology [facility]; last seen 02/2019) and GI [facility]) as scheduled    2. Nausea, difficulty swallowing -  Suspect could be related to prior h/o of esophageal dysmotility, also with h/o H. Pylori in past  - Encouraged patient follow-up with GI    3. Osteoporosis/ h/o  L1 compression fracture:   Repeat Dexa 2018 demonstrating continued osteopenia; discussed recommendation for treatment, which has been discussed in past by prior rheumatologists;  Reinforced recommendation to start anti osteoporotic treatment with either fosamax, IV zolendronic acid or denusomab as she is high risk for osteoporotic fracture; patient is aware of indication and recommendation for treatment;  politely declined by patient again.  -continue calcium + Vitamin D.    4. Healthcare Maintenance  Immunization History   Administered Date(s) Administered   ??? INFLUENZA TIV (TRI) 57MO+ W/ PRESERV (IM) 07/04/2011, 06/29/2012, 07/03/2014   ??? Influenza Vaccine Quad (IIV4 PF) 37mo+ injectable 08/17/2015   ??? Influenza Virus Vaccine, unspecified formulation 06/15/2016   ??? Influenza, High Dose (IIV4) 65 yrs & older 10/16/2013, 06/15/2016, 06/30/2017   ??? PNEUMOCOCCAL POLYSACCHARIDE 23 07/16/2012   ??? Pneumococcal Conjugate 13-Valent 10/16/2013   ??? TdaP 10/16/2009   Bone health - as above       RTC in 4-6 months or sooner PRN    I spent 21 minutes on the phone with the patient. I spent an additional 5 minutes on pre- and post-visit activities.     The patient was physically located in [place] or a state in which I am permitted to provide care. The patient understood that s/he may incur co-pays and cost sharing, and agreed to the telemedicine visit. The visit was completed via phone and/or video, which was  appropriate and reasonable under the circumstances given the patient's presentation at the time.    The patient has been advised of the potential risks and limitations of this mode of treatment (including, but not limited to, the absence of in-person examination) and has agreed to be treated using telemedicine. The patient's/patient's family's questions regarding telemedicine have been answered.     If the phone/video visit was completed in an ambulatory setting, the patient has also been advised to contact their provider???s office for worsening conditions, and seek emergency medical treatment and/or call 911 if the patient deems either necessary.    ____________________________________________________      Reason for F/U:  SLE    Identification: Pt self identified using name and date of birth    Patient location:   [address]  [place] [address]       HPI:   Stefanie Small is a 84 y.o. woman w/ history of SLE characterized by + ANA, +dsDNA, and hypocomplementemia, sicca symptoms, SLE-associated. pulmonary HTN, remote focal proliferative LN (dx 1992), esophageal dysmotility and h/o DVT who presents for follow-up.      She was last seen 12/2018.     She is currently maintained on Cellcept 1000 mg BID, plaquenil 200 mg daily and prednisone 5 mg daily. Patient also has h/o osteoporosis w/ compression fracture but has repeatedly declined treatment for osteoporosis     Interim History:    She says that she is not doing well.  She says she feels real nauseated.  This started over the weekend.  She says that she threw up her medication on Monday. She has not had any other vomiting. No diarrhea.      She says that it is hard to swallow her cellcept since she had the procedure on her esophagus.  She notes that she has to break it in half and that it tastes gross.     She denies fever, chills, weight loss, LAD, malar erythema, oral/nasal ulcers, rash, joint pain, joint swelling,  changes in bowels, no melena or BRBPR.      She does note she's had some hair loss recently - which she attributes to her thyroid being hyperactive.     Rheumatologic History:   Patient was diagnosed with SLE man years ago.  Her disease is characterized by  + ANA, +dsDNA, and hypocomplementemia, sicca symptoms, SLE-associated pulmonary HTN, remote focal proliferative LN (1992), esophageal dysmotility and h/o DVT.  With regard to her SLE treatment, she is currently maintained on Cellcept 1000 mg BID, plaquenil 200 mg daily and prednisone 5 mg daily. For her SLE-associated pulmonary HTN, she was diagnosed in 2011 but repeat RHC in 2013 demonstrated normal pressures.  However, she remains on tadalafil per pulmonary HTN clinic as patient endorsed improvement w/ regard to DOE while on this medication. There was mention of ILD in her prior rheumatology notes but chart review does not find clear evidence for that, her prior CT chest done in 2014 which does not mention about ILD but prior PFT were with low DLCO (not seen in epic).  Her repeat HRCT in 10/13/2014 did not show evidence of ILD. She has h/o  DVT, for which she is currently being anticoagulated. Her lupus inhibitor panel and anticardilipin Ab has been negative in past. In 2016, she was admitted to the hospital for gastric volvulus requiring emergent gastropexy. She also developed dysphagia and a PEG tube was also placed . She had esophageal manometry which showed esophageal dysmotility concerning for  scleroderma esophagus. However, her antibody profile has not supported this.  She was also diagnosed with CMV esophagitis in 2017 and is s/p 3 week treatment with oral valgancyclovir.      ROS: all system review was done and negative except mentioned in HPI.     Allergies:   Cefuroxime axetil; Propoxyphene n-acetaminophen; Sulfa (sulfonamide antibiotics); and Ultram     Current Outpatient Medications on File Prior to Visit   Medication Sig Dispense Refill   ??? ascorbic acid (VITAMIN C) 1000 MG tablet Take 1,000 mg by mouth daily.      ??? budesonide-formoteroL (SYMBICORT) 160-4.5 mcg/actuation inhaler Symbicort 160 mcg-4.5 mcg/actuation HFA aerosol inhaler     ??? cholecalciferol, vitamin D3, (CHOLECALCIFEROL) 400 unit Tab Take by mouth. Take 400 Units by mouth daily.     ??? cyanocobalamin 1,000 mcg/mL injection Inject 1 mL (1,000 mcg total) into the muscle every 30 (thirty) days. 1 mL 11   ??? fentaNYL (DURAGESIC) 50 mcg/hr patch Place 1 patch on the skin every third day.     ??? hydroxychloroquine (PLAQUENIL) 200 mg tablet Take 1 tablet (200 mg total) by mouth daily. 90 tablet 3   ??? lactulose (CHRONULAC) 10 gram/15 mL solution 30 ml every 4 hours until constipation is relieved     ??? letrozole (FEMARA) 2.5 mg tablet Take 2.5 mg by mouth daily.     ??? lisinopril (PRINIVIL,ZESTRIL) 5 MG tablet Take 5 mg by mouth.     ??? loperamide (IMODIUM) 2 mg capsule Take 2-4 mg by mouth 4 (four) times a day as needed.      ??? metoclopramide (REGLAN) 10 MG tablet Take 10 mg by mouth Four (4) times a day.      ??? metoprolol succinate (TOPROL-XL) 25 MG 24 hr tablet TAKE 1/2 TABLET BY MOUTH EVERY DAY 45 tablet 9   ??? mirtazapine (REMERON) 15 MG tablet 1/2 TO 1 TABLET 30 MINUTES BEFORE BEDTIME DAILY     ??? ondansetron (ZOFRAN-ODT) 4 MG disintegrating tablet TAKE 1 TABLET (4 MG TOTAL) BY MOUTH EVERY 4 (FOUR) HOURS AS NEEDED FOR NAUSEA OR VOMITING.  0   ??? oxyCODONE-acetaminophen (PERCOCET) 5-325 mg per tablet Take 5-325 tablets by mouth every four (4) hours as needed for pain.     ??? OXYGEN-AIR DELIVERY SYSTEMS MISC Inhale 2 mL. Per patient uses it at night.     ??? pantoprazole (PROTONIX) 40 MG tablet Take 40 mg by mouth Two (2) times a day.     ??? polyvinyl alcohol-povidon,PF, 1.4-0.6 % Dpet Apply to eye.     ??? predniSONE (DELTASONE) 5 MG tablet Take 1 tablet (5 mg total) by mouth daily. 30 tablet 5   ??? prochlorperazine (COMPAZINE) 5 MG tablet Take 5 mg by mouth.     ??? sucralfate (CARAFATE ORAL) Take by mouth Two (2) times a day.      ??? SYNTHROID 175 mcg tablet TAKE 1 TABLET (175 MCG TOTAL) BY MOUTH DAILY BEFORE BREAKFAST.  1   ??? tadalafil (ADCIRCA) 20 mg tablet Take 1 tablet (20 mg total) by mouth two (2) times a day. 60 tablet 11   ??? warfarin (COUMADIN) 5 MG tablet Take 4 mg by mouth daily with evening meal.        No current facility-administered medications on file prior to visit.        Review of Systems: All systems reviewed and negative except for that mentioned in the HPI. Physical Exam:  General:  Alert and interactive; does not sound  in distress  Pulmonary:  Speaking in full sentences; no cough, no wheezing  Psych: Mood and affect appropriate and congruent     LABS:  Lab Results   Component Value Date    WBC 6.7 10/04/2018    RBC 2.98 (L) 10/04/2018    HGB 8.9 (L) 10/04/2018    HCT 31.2 (L) 10/04/2018    MCV 105.0 (H) 10/04/2018    MCH 29.9 10/04/2018    MCHC 28.5 (L) 10/04/2018    RDW 16.3 (H) 10/04/2018    PLT 493 (H) 10/04/2018     Lab Results   Component Value Date    CREATININE 1.47 (H) 10/04/2018     Lab Results   Component Value Date    ALKPHOS 63 10/04/2018    BILITOT 0.2 10/04/2018    PROT 6.5 10/04/2018    ALBUMIN 3.2 (L) 10/04/2018    ALT 6 10/04/2018    AST 19 10/04/2018      Ref. Range 09/10/2015 16:03 02/29/2016 14:50 09/26/2016 17:18 03/02/2017 12:12   ANA Titer 1 Unknown 1:320      ANA Titer 2 Unknown 1:320      Antinuclear Antibodies (ANA) Latest Ref Range: Negative  Positive (A)      Pattern 1 (ANA) Unknown Homogenous      Pattern 2 (ANA) Unknown Speckled      dsDNA Ab Latest Ref Range: Negative  Positive (A) Positive (A) Positive (A) Positive (A)   ENA Screen Latest Ref Range: <0.70 ENA Units  1.00 (H)     ANTI-SM AB Latest Ref Range: Negative   Negative     Anti-RNP Antibody Latest Ref Range: Negative   Equivocal (A)     Anti-SSA Antibody Latest Ref Range: Negative   Negative     Anti-SSB Antibody Latest Ref Range: Negative   Negative     Anti-Jo1 Antibody Latest Ref Range: Negative   Negative     ANTI-SCL-70 AB Latest Ref Range: Negative   Negative     DS-DNA Titer Unknown 1:80 1:160 1:160 1:160   C3 Complement Latest Ref Range: 88 - 171 mg/dL 161 096 045 85 (L)   C4 Complement Latest Ref Range: 15.0 - 48.0 mg/dL 40.9 81.1 91.4 17.7

## 2019-05-22 NOTE — Unmapped (Signed)
[facility] Specialty Medication Referral: No PA required    Medication (Brand/Generic): Mycophenolate    Initial Benefits Investigation Claim completed with resulted information below:  No PA required  Patient ABLE to fill at [facility]  Insurance Company:  [employer]  Anticipated Copay: $13.43    As Co-pay is under $25 defined limit, per policy there will be no further investigation of need for financial assistance at this time unless patient requests. This referral has been communicated to the provider and handed off to the [facility] team for further processing and filling of prescribed medication.   ______________________________________________________________________  Please utilize this referral for viewing purposes as it will serve as the central location for all relevant documentation and updates.

## 2019-05-22 NOTE — Telephone Encounter (Signed)
Telephone call from patient's son Quillian Quince.  Daniel and patient in agreement with SW and RN making home visit 05-23-19 at 12:30 PM.

## 2019-05-22 NOTE — Telephone Encounter (Signed)
Telephone call to patient's son Quillian Quince to schedule palliative care visit.  Son informed Dr Derrel Nip made palliative care referral for patient.  Son states he will call nurse back after he speaks to his mother about referral.

## 2019-05-22 NOTE — Telephone Encounter (Signed)
Dr. Derrel Nip, the closest [facility] is in [place] and they say that they serve [place]. Do I need to go ahead and refer her over there? Thx, melissa

## 2019-05-22 NOTE — Progress Notes (Signed)
Hematology/Oncology Consult note [facility]  [phone] [phone]  Patient Care Team: Sherlene Shams, MD as PCP - General (Internal Medicine) Lemar Livings Merrily Pew, MD (General Surgery) Darci Current, MD as Attending Physician (Emergency Medicine)   Name of the patient: Diana [name]  [id number]  05-Jan-1935   Date of visit: 05/22/19  Diagnosis- 1. Anemia of chronic kidney disease 2. Left breast cancer on letrozole  Chief complaint/ Reason for visit- routine f/u of anemia for EPO  Heme/Onc history:  Oncology History Overview Note  84 year old female with stage I (T1 C. N0 M0) invasive mammary carcinoma status post wide local excision and sentinel node biopsy tumor is ER/PR positive HER-2/neu not over expressed with a low Oncotype DX score of 17 for adjuvant whole breast radiation Oncotype DX score is 17 Patient has finished radiation therapy in December of 2015 Started letrozole from December, 2015   Breast cancer, left (HCC)  05/26/2014 Initial Diagnosis   Breast cancer of upper-outer quadrant of left female breast (HCC), stage I, T1 cN0 M0, ER/PR positive HER-2/neu not overexpressed.   06/06/2014 Oncotype testing   Oncotype DX score 17    - 09/05/2014 Radiation Therapy     09/05/2014 -  Anti-estrogen oral therapy   Started letrozole    Patient has been having progressive anemia especially over the last 6 months her hemoglobin is drifted down to 8 from her baseline of 10.Results of anemia work-up from 10/09/2018 are as follows: CBC showed a white count of 5.6, H&H of 8.1/27.5 with an MCV of 100 and a platelet count of 315. CMP was significant for elevated creatinine of 1.4. B12 folate were normal. TSH was mildly low at 0.1. Multiple myeloma panel showed no M protein. Polyclonal gammopathy. Haptoglobin was elevated at 344. Reticulocyte count was 2.1% which is inappropriately low for the degree of anemia indicative of  hypoproliferative anemia. Iron studies showed iron saturation of 21% and ferritin of 111.  No significant improvement in anemia after Feraheme.  She has been getting Aranesp for anemia of kidney disease    Interval history- she has intermittent dull aching abdominal pain which is chronic. She feels fatigued. No recent falls  ECOG PS- 2 Pain scale- 3   Review of systems- Review of Systems  Constitutional: Positive for malaise/fatigue. Negative for chills, fever and weight loss.  HENT: Negative for congestion, ear discharge and nosebleeds.   Eyes: Negative for blurred vision.  Respiratory: Negative for cough, hemoptysis, sputum production, shortness of breath and wheezing.   Cardiovascular: Negative for chest pain, palpitations, orthopnea and claudication.  Gastrointestinal: Positive for abdominal pain. Negative for blood in stool, constipation, diarrhea, heartburn, melena, nausea and vomiting.  Genitourinary: Negative for dysuria, flank pain, frequency, hematuria and urgency.  Musculoskeletal: Negative for back pain, joint pain and myalgias.  Skin: Negative for rash.  Neurological: Negative for dizziness, tingling, focal weakness, seizures, weakness and headaches.  Endo/Heme/Allergies: Does not bruise/bleed easily.  Psychiatric/Behavioral: Negative for depression and suicidal ideas. The patient does not have insomnia.       Allergies  Allergen Reactions  . Iodinated Diagnostic Agents Hives    Pt states she broke out in hives years ago, has been premedicated in the past.   . Tramadol Rash       . Amoxicillin Other (See Comments)    Reaction:  Stomach cramps   . Cefuroxime Itching  . Metrizamide Hives  . Morphine And Related     Reaction: face flushed    .  Propoxyphene     Stomach cramp  . Adhesive [Tape] Rash    Silk tape causes red skin  . Cephalexin Rash  . Sulfa Antibiotics Rash  . Sulfonamide Derivatives Rash  . Venlafaxine Rash     Past Medical History:   Diagnosis Date  . Acute glomerulonephritis with other specified pathological lesion in kidney in disease classified elsewhere(580.81)   . Alveolar aeration decreased   . Anemia   . Arthritis   . Atrial fibrillation (HCC)   . Breast cancer (HCC) 06/03/2014   positive, radiation  . Cancer (HCC)    Breast  . CHF (congestive heart failure) (HCC)   . Diaphragmatic hernia   . DVT (deep venous thrombosis) (HCC)   . Dysrhythmia   . Environmental allergies   . Erosive esophagitis 03/28/2015   esophageal biopsy July 2017 c/w CMV cytopathic changes, negative for H Pylori and dysplasia     . Esophageal reflux   . Esophagitis, CMV (HCC) 05/26/2015   Diagnosed at [facility] with manometry,  Tube feeds started for enteral nutrtion 05/12/15   . Feeding problem    FEEDING TUBE  . Hiatal hernia   . Hyperlipidemia   . Hypertension   . Lower extremity edema   . Lupus (systemic lupus erythematosus) (HCC)   . Neuropathy   . Osteopenia   . Oxygen deficiency    USES HS  . Peripheral neuropathy, hereditary/idiopathic   . Personal history of radiation therapy   . Pneumonia   . Primary pulmonary hypertension (HCC)   . Renal insufficiency   . Sciatica   . Shingles   . Shingles (herpes zoster) polyneuropathy March 2013   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  . Shortness of breath dyspnea   . Unspecified hypothyroidism   . Unspecified menopausal and postmenopausal disorder   . Urinary incontinence without sensory awareness   . Vitamin B deficiency      Past Surgical History:  Procedure Laterality Date  . BREAST BIOPSY Left 2015   invasive mammary carcinoma  . BREAST LUMPECTOMY Left 2015   invasive mammary carcinoma clear margins but close  . BREAST SURGERY    . CATARACT EXTRACTION W/PHACO Right 04/05/2016   Procedure: CATARACT EXTRACTION PHACO AND INTRAOCULAR LENS PLACEMENT (IOC);  Surgeon: Galen Manila, MD;  Location: [facility] ORS;  Service: Ophthalmology;  Laterality: Right;  [place] 01:20 AP% 23.9 CDE  19.29 fluid pack lot # [id number]  . CATARACT EXTRACTION W/PHACO Left 04/26/2016   Procedure: CATARACT EXTRACTION PHACO AND INTRAOCULAR LENS PLACEMENT (IOC);  Surgeon: Galen Manila, MD;  Location: [facility] ORS;  Service: Ophthalmology;  Laterality: Left;   [place] 00:59 AP% 23.2 CDE 13.83 Fluid pack lot # [id number]  . COLONOSCOPY WITH PROPOFOL N/A 06/04/2018   Procedure: COLONOSCOPY WITH PROPOFOL;  Surgeon: Christena Deem, MD;  Location: [facility] ENDOSCOPY;  Service: Endoscopy;  Laterality: N/A;  . DIAGNOSTIC LAPAROSCOPY    . ESOPHAGOGASTRODUODENOSCOPY N/A 07/19/2015   Procedure: ESOPHAGOGASTRODUODENOSCOPY (EGD);  Surgeon: Wallace Cullens, MD;  Location: [facility] ENDOSCOPY;  Service: Gastroenterology;  Laterality: N/A;  . ESOPHAGOGASTRODUODENOSCOPY (EGD) WITH PROPOFOL N/A 03/28/2015   Procedure: ESOPHAGOGASTRODUODENOSCOPY (EGD) WITH PROPOFOL;  Surgeon: Earline Mayotte, MD;  Location: [facility] ENDOSCOPY;  Service: Endoscopy;  Laterality: N/A;  . ESOPHAGOGASTRODUODENOSCOPY (EGD) WITH PROPOFOL N/A 06/10/2015   Procedure: ESOPHAGOGASTRODUODENOSCOPY (EGD) WITH PROPOFOL;  Surgeon: Christena Deem, MD;  Location: [facility] ENDOSCOPY;  Service: Endoscopy;  Laterality: N/A;  . ESOPHAGOGASTRODUODENOSCOPY (EGD) WITH PROPOFOL N/A 10/20/2015   Procedure: ESOPHAGOGASTRODUODENOSCOPY (EGD) WITH PROPOFOL;  Surgeon: Cindra Eves  Marva Panda, MD;  Location: [facility] ENDOSCOPY;  Service: Endoscopy;  Laterality: N/A;  . ESOPHAGOGASTRODUODENOSCOPY (EGD) WITH PROPOFOL N/A 02/23/2016   Procedure: ESOPHAGOGASTRODUODENOSCOPY (EGD) WITH PROPOFOL;  Surgeon: Christena Deem, MD;  Location: [facility] ENDOSCOPY;  Service: Endoscopy;  Laterality: N/A;  . ESOPHAGOGASTRODUODENOSCOPY (EGD) WITH PROPOFOL N/A 06/30/2016   Procedure: ESOPHAGOGASTRODUODENOSCOPY (EGD) WITH PROPOFOL;  Surgeon: Christena Deem, MD;  Location: [facility] ENDOSCOPY;  Service: Endoscopy;  Laterality: N/A;  . ESOPHAGOGASTRODUODENOSCOPY (EGD) WITH PROPOFOL N/A 01/05/2017   Procedure:  ESOPHAGOGASTRODUODENOSCOPY (EGD) WITH PROPOFOL;  Surgeon: Christena Deem, MD;  Location: [facility] ENDOSCOPY;  Service: Endoscopy;  Laterality: N/A;  . ESOPHAGOGASTRODUODENOSCOPY (EGD) WITH PROPOFOL N/A 03/11/2019   Procedure: ESOPHAGOGASTRODUODENOSCOPY (EGD) WITH PROPOFOL;  Surgeon: Midge Minium, MD;  Location: [facility] ENDOSCOPY;  Service: Endoscopy;  Laterality: N/A;  . ESOPHAGOGASTRODUODENOSCOPY (EGD) WITH PROPOFOL N/A 04/11/2019   Procedure: ESOPHAGOGASTRODUODENOSCOPY (EGD) WITH PROPOFOL;  Surgeon: Midge Minium, MD;  Location: [facility] ENDOSCOPY;  Service: Endoscopy;  Laterality: N/A;  . KYPHOPLASTY N/A 02/10/2015   Procedure: [id number];  Surgeon: Kennedy Bucker, MD;  Location: [facility] ORS;  Service: Orthopedics;  Laterality: N/A;  . LAPAROTOMY N/A 03/11/2015   Procedure: REDUCTION OF GASTRIC VOLVULUS, SPLEENECTOMY, GASTRIC TUBE PLACEMENT;  Surgeon: Earline Mayotte, MD;  Location: [facility] ORS;  Service: General;  Laterality: N/A;  . MOHS SURGERY    . PERIPHERAL VASCULAR CATHETERIZATION N/A 06/12/2015   Procedure: IVC Filter Insertion;  Surgeon: Annice Needy, MD;  Location: [facility] INVASIVE CV LAB;  Service: Cardiovascular;  Laterality: N/A;  . PERIPHERAL VASCULAR CATHETERIZATION N/A 08/23/2016   Procedure: IVC Filter Removal;  Surgeon: Renford Dills, MD;  Location: [facility] INVASIVE CV LAB;  Service: Cardiovascular;  Laterality: N/A;  . STOMACH SURGERY     for volvolus    Social History   Socioeconomic History  . Marital status: Divorced    Spouse name: Not on file  . Number of children: Not on file  . Years of education: Not on file  . Highest education level: Not on file  Occupational History  . 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 Financial resource strain: Not hard at all  . Food insecurity    Worry: Never true    Inability: Never true  . Transportation needs    Medical: No    Non-medical: No  Tobacco Use  . Smoking status: Former Smoker    Quit  date: 05/25/1991    Years since quitting: 28.0  . Smokeless tobacco: Never Used  Substance and Sexual Activity  . Alcohol use: No    Frequency: Never    Comment: rare  . Drug use: No  . Sexual activity: Not on file  Lifestyle  . Physical activity    Days per week: 0 days    Minutes per session: 0 min  . Stress: Not at all  Relationships  . Social Musician on phone: Patient refused    Gets together: Patient refused    Attends religious service: Patient refused    Active member of club or organization: Patient refused    Attends meetings of clubs or organizations: Patient refused    Relationship status: Divorced  . Intimate partner violence    Fear of current or ex partner: No    Emotionally abused: No    Physically abused: No    Forced sexual activity: No  Other Topics Concern  . Not on file  Social History Narrative  . Not  on file    Family History  Problem Relation Age of Onset  . Colon cancer Mother   . Cirrhosis Brother        alcoholic  . Alcohol abuse Sister   . Breast cancer Sister 64  . Cancer Brother   . Lung cancer Son   . Meniere's disease Son   . Cirrhosis Brother        nonalcoholic      Current Outpatient Medications:  .  ALPRAZolam (XANAX) 0.25 MG tablet, Take 1 tablet (0.25 mg total) by mouth 2 (two) times daily as needed for anxiety., Disp: 20 tablet, Rfl: 5 .  Ascorbic Acid (VITAMIN C) 1000 MG tablet, Take 1,000 mg by mouth daily., Disp: , Rfl:  .  budesonide-formoterol (SYMBICORT) 160-4.5 MCG/ACT inhaler, Symbicort 160 mcg-4.5 mcg/actuation HFA aerosol inhaler, Disp: , Rfl:  .  Calcium Carbonate-Vitamin D 600-400 MG-UNIT tablet, Take 1 tablet by mouth daily., Disp: , Rfl:  .  cyanocobalamin (,VITAMIN B-12,) 1000 MCG/ML injection, Inject 1 mL (1,000 mcg total) into the muscle every 30 (thirty) days. Pt uses on the 1st of every month., Disp: 10 mL, Rfl: 1 .  fentaNYL (DURAGESIC) 50 MCG/HR, Place 1 patch onto the skin every 3 (three)  days., Disp: 10 patch, Rfl: 0 .  fentaNYL 62.5 MCG/HR PT72, Place 1 patch onto the skin every 3 (three) days., Disp: 10 patch, Rfl: 0 .  furosemide (LASIX) 20 MG tablet, TAKE 1 TABLET DAILY AS NEEDED FOR FLUID RETENTION, Disp: 30 tablet, Rfl: 3 .  hydroxychloroquine (PLAQUENIL) 200 MG tablet, Take 200 mg by mouth daily., Disp: , Rfl:  .  letrozole (FEMARA) 2.5 MG tablet, TAKE 1 TABLET BY MOUTH EVERY DAY, Disp: 90 tablet, Rfl: 1 .  levothyroxine (SYNTHROID) 112 MCG tablet, Take 1 tablet (112 mcg total) by mouth daily. NAME BRAND ONLY SYNTHROID PLEASE  NOTE CURRENT DOSE, Disp: 90 tablet, Rfl: 0 .  losartan (COZAAR) 25 MG tablet, TAKE 1 TABLET BY MOUTH EVERYDAY AT BEDTIME, Disp: 30 tablet, Rfl: 2 .  metoCLOPramide (REGLAN) 10 MG tablet, TAKE 1 TABLET BY MOUTH 4 TIMES DAILY - BEFORE MEALS AND AT BEDTIME., Disp: 360 tablet, Rfl: 0 .  metoprolol succinate (TOPROL-XL) 25 MG 24 hr tablet, Take 12.5 mg by mouth daily. , Disp: , Rfl:  .  mupirocin ointment (BACTROBAN) 2 %, Apply 1 application topically 2 (two) times daily. Apply thin layer to affected area on leg, Disp: 22 g, Rfl: 0 .  mycophenolate (CELLCEPT) 500 MG tablet, Take 1,000 mg by mouth 2 (two) times daily., Disp: , Rfl:  .  ondansetron (ZOFRAN ODT) 4 MG disintegrating tablet, Take 1 tablet (4 mg total) by mouth every 8 (eight) hours as needed for nausea or vomiting., Disp: 20 tablet, Rfl: 0 .  oxyCODONE-acetaminophen (PERCOCET/ROXICET) 5-325 MG tablet, Take 1 tablet by mouth 2 (two) times daily as needed for severe pain., Disp: 60 tablet, Rfl: 0 .  OXYGEN, Inhale 2 mLs into the lungs at bedtime., Disp: , Rfl:  .  pantoprazole (PROTONIX) 40 MG tablet, Take 40 mg by mouth 2 (two) times daily., Disp: , Rfl:  .  Polyvinyl Alcohol-Povidone (REFRESH OP), Place 2 drops into both eyes daily as needed (dry eyes). , Disp: , Rfl:  .  predniSONE (DELTASONE) 5 MG tablet, Take 5 mg by mouth daily. , Disp: , Rfl:  .  prochlorperazine (COMPAZINE) 5 MG tablet,  Take 1 tablet (5 mg total) by mouth every 6 (six) hours as needed for nausea  or vomiting., Disp: 30 tablet, Rfl: 5 .  Saccharomyces boulardii (PROBIOTIC) 250 MG CAPS, Take 1 tablet by mouth daily., Disp: 14 capsule, Rfl:  .  sucralfate (CARAFATE) 1 GM/10ML suspension, Take 1 g by mouth 4 (four) times daily. Before meals and nightly, Disp: , Rfl:  .  Syringe/Needle, Disp, (SYRINGE 3CC/25GX1") 25G X 1" 3 ML MISC, Use for b12 injections, Disp: 50 each, Rfl: 0 .  Tadalafil, PAH, (ADCIRCA) 20 MG TABS, Take 40 mg by mouth daily., Disp: , Rfl:  .  valGANciclovir (VALCYTE) 450 MG tablet, Take 1 tablet (450 mg total) by mouth every other day., Disp: 15 tablet, Rfl: 1 .  warfarin (COUMADIN) 1 MG tablet, 1 MG ALTERNATING WITH 2 MG DAILY, Disp: 45 tablet, Rfl: 1 .  warfarin (COUMADIN) 2 MG tablet, TAKE 1 TABLET BY MOUTH EVERY DAY, Disp: 30 tablet, Rfl: 1  Physical exam: There were no vitals filed for this visit. Physical Exam HENT:     Head: Normocephalic and atraumatic.  Eyes:     Pupils: Pupils are equal, round, and reactive to light.  Neck:     Musculoskeletal: Normal range of motion.  Cardiovascular:     Rate and Rhythm: Normal rate and regular rhythm.     Heart sounds: Normal heart sounds.  Pulmonary:     Effort: Pulmonary effort is normal.     Breath sounds: Normal breath sounds.  Abdominal:     General: Bowel sounds are normal.     Palpations: Abdomen is soft.  Musculoskeletal:     Right lower leg: Edema present.     Left lower leg: Edema present.  Skin:    General: Skin is warm and dry.  Neurological:     Mental Status: She is alert and oriented to person, place, and time.      CMP Latest Ref Rng & Units 05/06/2019  Glucose 65 - 99 mg/dL 161(W)  BUN 7 - 25 mg/dL 96(E)  Creatinine 4.54 - 0.88 mg/dL 0.98(J)  Sodium 191 - 478 mmol/L 142  Potassium 3.5 - 5.3 mmol/L 4.7  Chloride 98 - 110 mmol/L 113(H)  CO2 20 - 32 mmol/L 24  Calcium 8.6 - 10.4 mg/dL 7.9(L)  Total Protein 6.1 - 8.1  g/dL 2.9(F)  Total Bilirubin 0.2 - 1.2 mg/dL 0.3  Alkaline Phos 38 - 126 U/L -  AST 10 - 35 U/L 16  ALT 6 - 29 U/L 9   CBC Latest Ref Rng & Units 04/29/2019  WBC 4.0 - 10.5 K/uL 6.9  Hemoglobin 12.0 - 15.0 g/dL 6.2(Z)  Hematocrit 30.8 - 46.0 % 32.1(L)  Platelets 150 - 400 K/uL 142(L)    No images are attached to the encounter.  No results found.   Assessment and plan- Patient is a 84 y.o. female with anemia of chronic kidney disease here for following issues:  1. Anemia: it has improved after starting epo. She was not able to get a blood draw today and therefore cannot get epo today. She will come back later this week or next week for cbc with diff and if her hb is <11, she will get epo. She will continue to get epo Q3 weeks and I will see ehr back in 12 weeks  2. Left breast cancer continue letrozole until dec 2020     Visit Diagnosis 1. Anemia of chronic renal failure, unspecified CKD stage   2. Erythropoietin (EPO) stimulating agent anemia management patient      Dr. Owens Shark, MD, MPH [facility]  at [facility] [phone] 05/22/2019 10:58 AM

## 2019-05-22 NOTE — Telephone Encounter (Signed)
Can you check with [facility] and then with palliative care to confirm that they do not provide these additional services?  because [facility] will be out of pocket , right?

## 2019-05-23 ENCOUNTER — Other Ambulatory Visit: Payer: Medicare Other

## 2019-05-23 ENCOUNTER — Telehealth: Payer: Self-pay

## 2019-05-23 ENCOUNTER — Other Ambulatory Visit: Payer: Self-pay

## 2019-05-23 DIAGNOSIS — Z515 Encounter for palliative care: Secondary | ICD-10-CM

## 2019-05-23 MED FILL — MYCOPHENOLATE SODIUM 180 MG TABLET,DELAYED RELEASE: 30 days supply | Qty: 120 | Fill #0 | Status: AC

## 2019-05-23 NOTE — Telephone Encounter (Signed)
Coumadin level is therapeutic,  Continue current regimen and repeat PT/INR in one week 

## 2019-05-23 NOTE — Progress Notes (Signed)
COMMUNITY PALLIATIVE CARE SW NOTE  PATIENT NAME: Diana [name] DOB: 1935/03/31 MRN: [id number]  PRIMARY CARE PROVIDER: Crecencio Mc, MD  RESPONSIBLE PARTY:  Acct ID - Guarantor Home Phone Work Phone Relationship Acct Type  [id number] Diana, [name]  Self P/F     [place], Diana [name], [place] [address]     PLAN OF CARE and INTERVENTIONS:             1. GOALS OF CARE/ ADVANCE CARE PLANNING:  Patient is a FULL CODE. Patient has Living Will and HCPOA. SW request that patient have these forms available for SW to scan into chart at next visit. Patient's goal is to continue to improve.  2. SOCIAL/EMOTIONAL/SPIRITUAL ASSESSMENT/ INTERVENTIONS:  SW and RN met with patient and Diana [name] (patient's son) in the home. Patient reports that she is doing "okay". Patient provided brief medical history and discussed recent hospitalizations. Patient feels that she has declined since hospitalizations. Patient was alert, engaged in conversation. Patient is retried, worked as Therapist, sports at [employer]. Patient is divorced. Patient has one son, Diana [name] that lives in the home. Patient had another son that passed away in a car accident over 20 years ago. Patient is not eating well, reports ongoing nausea. Patient said she sleeps well. Patient does have pain in her side and back. Patient said she has trouble doing what she used to do, like cooking. Patient enjoys sitting and looking out her back window.  3. PATIENT/CAREGIVER EDUCATION/ COPING:  Patient had flat affect. Patient expressed frustration with needing more assistance with ADLs. Diana [name] helps with care but is working full-time and is gone most of the day. Patient cares cell phone around on her walker. Discussed MedAlert and provided contact number. Daniel to follow-up.  4. PERSONAL EMERGENCY PLAN:  Patient/family will call 9-1-1 for emergencies.  5. COMMUNITY RESOURCES COORDINATION/ HEALTH CARE NAVIGATION:  Patient is receiving home health nursing with  [facility] for wound care twice a week. Patient was receiving an aide to help with bathing but said that this has stopped, patient hopes that they can resume this service. Patient goes to the wound clinic once a week. 6. FINANCIAL/LEGAL CONCERNS/INTERVENTIONS:  None.     SOCIAL HX:  Social History   Tobacco Use  . Smoking status: Former Smoker    Quit date: 05/25/1991    Years since quitting: 28.0  . Smokeless tobacco: Never Used  Substance Use Topics  . Alcohol use: No    Frequency: Never    Comment: rare    CODE STATUS:   Code Status: Prior (FULL CODE) ADVANCED DIRECTIVES: Y MOST FORM COMPLETE:  Discussed, patient is interested in completing.  HOSPICE EDUCATION PROVIDED: Yes - discussed hospice and hospice services. Team feels that patient may be appropriate but patient said she is not ready at this time.  PPS: Patient is able to dress herself, and feed herself. Patient needs help with meal prep and bathing. Patient ambulates with a walker and does well.  I spent 60 minutes with patient/family, from 12:30-1:30p providing education, support and consultation.   Diana Lombard, LCSW

## 2019-05-23 NOTE — Telephone Encounter (Signed)
Pt's INR for 05/22/2019 is 2.2. Pt is currently taking 1mg  every other day.   Abstracted.

## 2019-05-23 NOTE — Unmapped (Signed)
[facility]   Patient Onboarding/Medication Counseling    Stefanie Small is a 84 y.o. female with SLE who I am counseling today on initiation of therapy.  I am speaking to the patient.    Verified patient's date of birth / HIPAA.    Specialty medication(s) to be sent: Inflammatory Disorders: mycophenolate      Non-specialty medications/supplies to be sent: none       Myfortic (mycophenolic acid)    Medication & Administration     Dosage:   ? Take 2 tablets (360mg ) by mouth 2 times daily    Administration:   ? Take with or without food, although taking with food helps minimize GI side effects.  ? Swallow the pills whole, do not chew or crush    Adherence/Missed dose instructions:  ? Take a missed dose as soon as you think about it.  ? If it is less than 2 hours until your next dose, skip the missed dose and go back to your normal time.  ? Do not take 2 doses at the same time or extra doses.    Goals of Therapy     ? To prevent organ rejection    Side Effects & Monitoring Parameters     ? Common side effects  ? Back or joint pain  ? Constipation  ? Headache/dizziness  ? Not hungry  ? Stomach pain, diarrhea, constipation, gas, upset stomach, vomiting, nausea  ? Feeling tired or weak  ? Shakiness  ? Trouble sleeping  ? Increased risk of infection    ? The following side effects should be reported to the provider:  ? Allergic reaction  ? High blood sugar (confusion, feeling sleepy, more thirst, more hungry, passing urine more often, flushing, fast breathing, or breath that smells like fruit)  ? Electrolyte issues (mood changes, confusion, muscle pain or weakness, a heartbeat that does not feel normal, seizures, not hungry, or very bad upset stomach or throwing up)  ? High or low blood pressure (bad headache or dizziness, passing out, or change in eyesight)  ? Kidney issues (unable to pass urine, change in how much urine is passed, blood in the urine, or a big weight gain)  ? Skin (oozing, heat, swelling, redness, or pain), UTI and other infections   ? Chest pain or pressure  ? Abnormal heartbeat  ? Unexplained bleeding or bruising  ? Abnormal burning, numbness, or tingling  ? Muscle cramps,  ? Yellowing of skin or eyes    ? Monitoring parameters  ? Pregnancy test initially prior to treatment and 8-10 days later then as needed)  ? CBC weekly for first month then twice monthly for next 2 months, then monthly)  ? Monitor Renal and liver functions  ? Signs of organ rejection    Contraindications, Warnings, & Precautions     ? *This is a REMS drug and an FDA-approved patient medication guide will be printed with each dispensation  ? Black Box Warning: Infections   ? Black Box Warning: Lymphoproliferative disorders - risk of development of lymphoma and skin malignancy is increased  ? Black Box Warning: Use during pregnancy is associated with increased risks of first trimester pregnancy loss and congenital malformations.   ? Black Box Warning: Females of reproductive potential should use contraception during treatment and for 6 weeks after therapy is discontinued  ? CNS depression  ? New or reactivated viral infections  ? Neutropenia  ? Female patients and/or their female partners should use  effective contraception during treatment of the female patient and for at least 3 months after last dose.  ? Breastfeeding is not recommended during therapy and for 6 weeks after last dose    Drug/Food Interactions     ? Medication list reviewed in Epic. The patient was instructed to inform the care team before taking any new medications or supplements. No drug interactions identified.   ? Do not take Echinacea while on this medication  ? Check with your doctor before getting any vaccinations (live or inactivated)    Storage, Handling Precautions, & Disposal     ? Store at room temperature  ? Keep away from children and pets  ? This drug is considered hazardous and should be handled as little as possible.  Wash hands before and after touching pills. If someone else helps with medication administration, they should wear gloves.    ** Due to having been on another form of the same medication (generic Cellcept), the patient declined the following portions of the counseling outlined above: administration; adherence / missed dose instructions; goals of therapy; side effects and monitoring parameters; contraindications, warnings, and precautions; drug/food interactions; storage, handling precautions, and disposal **      Current Medications (including OTC/herbals), Comorbidities and Allergies     Current Outpatient Medications   Medication Sig Dispense Refill   ??? ascorbic acid (VITAMIN C) 1000 MG tablet Take 1,000 mg by mouth daily.      ??? budesonide-formoteroL (SYMBICORT) 160-4.5 mcg/actuation inhaler Symbicort 160 mcg-4.5 mcg/actuation HFA aerosol inhaler     ??? cholecalciferol, vitamin D3, (CHOLECALCIFEROL) 400 unit Tab Take by mouth. Take 400 Units by mouth daily.     ??? cyanocobalamin 1,000 mcg/mL injection Inject 1 mL (1,000 mcg total) into the muscle every 30 (thirty) days. 1 mL 11   ??? fentaNYL (DURAGESIC) 50 mcg/hr patch Place 1 patch on the skin every third day.     ??? hydroxychloroquine (PLAQUENIL) 200 mg tablet Take 1 tablet (200 mg total) by mouth daily. 90 tablet 3   ??? lactulose (CHRONULAC) 10 gram/15 mL solution 30 ml every 4 hours until constipation is relieved     ??? letrozole (FEMARA) 2.5 mg tablet Take 2.5 mg by mouth daily.     ??? lisinopril (PRINIVIL,ZESTRIL) 5 MG tablet Take 5 mg by mouth.     ??? loperamide (IMODIUM) 2 mg capsule Take 2-4 mg by mouth 4 (four) times a day as needed.      ??? metoclopramide (REGLAN) 10 MG tablet Take 10 mg by mouth Four (4) times a day.      ??? metoprolol succinate (TOPROL-XL) 25 MG 24 hr tablet TAKE 1/2 TABLET BY MOUTH EVERY DAY 45 tablet 9   ??? mirtazapine (REMERON) 15 MG tablet 1/2 TO 1 TABLET 30 MINUTES BEFORE BEDTIME DAILY     ??? mycophenolate (MYFORTIC) 180 MG EC tablet Take 2 tablets (360 mg total) by mouth Two (2) times a day. 120 tablet 3   ??? ondansetron (ZOFRAN-ODT) 4 MG disintegrating tablet TAKE 1 TABLET (4 MG TOTAL) BY MOUTH EVERY 4 (FOUR) HOURS AS NEEDED FOR NAUSEA OR VOMITING.  0   ??? oxyCODONE-acetaminophen (PERCOCET) 5-325 mg per tablet Take 5-325 tablets by mouth every four (4) hours as needed for pain.     ??? OXYGEN-AIR DELIVERY SYSTEMS MISC Inhale 2 mL. Per patient uses it at night.     ??? pantoprazole (PROTONIX) 40 MG tablet Take 40 mg by mouth Two (2) times a day.     ???  polyvinyl alcohol-povidon,PF, 1.4-0.6 % Dpet Apply to eye.     ??? predniSONE (DELTASONE) 5 MG tablet Take 1 tablet (5 mg total) by mouth daily. 30 tablet 5   ??? prochlorperazine (COMPAZINE) 5 MG tablet Take 5 mg by mouth.     ??? sucralfate (CARAFATE ORAL) Take by mouth Two (2) times a day.      ??? SYNTHROID 175 mcg tablet TAKE 1 TABLET (175 MCG TOTAL) BY MOUTH DAILY BEFORE BREAKFAST.  1   ??? tadalafil (ADCIRCA) 20 mg tablet Take 1 tablet (20 mg total) by mouth two (2) times a day. 60 tablet 11   ??? warfarin (COUMADIN) 5 MG tablet Take 4 mg by mouth daily with evening meal.        No current facility-administered medications for this visit.        Allergies   Allergen Reactions   ??? Iodinated Contrast Media Hives     Pt states she broke out in hives years ago, has been premedicated in the past.    ??? Ioxaglate Sodium Hives     Pt states she broke out in hives years ago, has been premedicated in the past.    ??? Metrizamide Hives     Pt states she broke out in hives years ago, has been premedicated in the past.    ??? Amoxicillin      REACTION: Stomach cramps   ??? Cefuroxime Axetil Other (See Comments)   ??? Cephalexin      REACTION: Rash   ??? Propoxyphene N-Acetaminophen Other (See Comments)   ??? Tramadol Hcl      REACTION: Rash   ??? Venlafaxine      REACTION: Unknown reaction   ??? Cefuroxime Itching   ??? Sulfa (Sulfonamide Antibiotics) Rash     REACTION: Rash  REACTION: Rash   ??? Ultram [Tramadol] Rash and Other (See Comments)     Rash Patient Active Problem List   Diagnosis   ??? Vitamin B12 deficiency anemia   ??? Herpes zoster complications   ??? Esophageal reflux   ??? Personal history of endocrine, metabolic or immunity disorder   ??? Diaphragmatic hernia   ??? Hypothyroidism   ??? Urinary incontinence without sensory awareness   ??? Edema   ??? Insomnia   ??? Lupus erythematosus   ??? Microscopic hematuria   ??? Hereditary and idiopathic peripheral neuropathy   ??? Retention of urine   ??? Herpes zoster with nervous system complications   ??? Disorder of kidney and ureter   ??? Sciatica   ??? Personal history of other specified diseases   ??? Systemic lupus erythematosus (CMS-HCC)   ??? Personal history of tobacco use, presenting hazards to health   ??? B-complex deficiency   ??? Injury, multiple sites   ??? Incomplete bladder emptying   ??? Pulmonary hypertensive venous disease (CMS-HCC)   ??? Diastolic heart failure (CMS-HCC)   ??? SCC (squamous cell carcinoma), hand   ??? Iron deficiency anemia       Reviewed and up to date in Epic.    Appropriateness of Therapy     Is medication and dose appropriate based on diagnosis? Yes    Baseline Quality of Life Assessment      How many days over the past month did your SLE keep you from your normal activities? 0    Financial Information     Medication Assistance provided: None Required    Anticipated copay of $13.43 reviewed with patient. Verified delivery address.    Delivery Information  Scheduled delivery date: 05/24/2019    Expected start date: 05/25/2019; will take Cellcept generic through Friday and start Myfortic generic on Saturday    Medication will be delivered via Next Day Courier to the home address in [facility].  This shipment will not require a signature.      Explained the services we provide at [facility] and that each month we would call to set up refills.  Stressed importance of returning phone calls so that we could ensure they receive their medications in time each month.  Informed patient that we should be setting up refills 7-10 days prior to when they will run out of medication.  A pharmacist will reach out to perform a clinical assessment periodically.  Informed patient that a welcome packet and a drug information handout will be sent.      Patient verbalized understanding of the above information as well as how to contact the pharmacy at [phone] option 4 with any questions/concerns.  The pharmacy is open Monday through Friday 8:30am-4:30pm.  A pharmacist is available 24/7 via pager to answer any clinical questions they may have.    Patient Specific Needs     ? Does the patient have any physical, cognitive, or cultural barriers? No    ? Patient prefers to have medications discussed with  Patient     ? Is the patient able to read and understand education materials at a high school level or above? Yes    ? Patient's primary language is  English     ? Is the patient high risk? No     ? Does the patient require a Care Management Plan? No     ? Does the patient require physician intervention or other additional services (i.e. nutrition, smoking cessation, social work)? No      Karene Fry Teka Chanda  [facility] Specialty Pharmacist

## 2019-05-23 NOTE — Progress Notes (Signed)
PATIENT NAME: Diana [name] DOB: [birth year] MRN: [id number]  PRIMARY CARE PROVIDER: Sherlene Shams, MD  RESPONSIBLE PARTY:  Acct ID - Guarantor Home Phone Work Phone Relationship Acct Type  [id number] MCKENSEY, PLACE* [phone]  Self P/F     [address], [place], [place] [address]    PLAN OF CARE and INTERVENTIONS:               1.  GOALS OF CARE/ ADVANCE CARE PLANNING:  Remain in her home with son Reuel Boom with symptoms managed.               2.  PATIENT/CAREGIVER EDUCATION:  Education on hospice verses palliative care, education on disease progression, education on s/s of infection, education on fall precautions.                3.  DISEASE STATUS: SW and RN made home visit to patients home. Palliative team met with patient and her son Reuel Boom. Patients son Reuel Boom lives in home with patient and assist patient with care. Patient alert and oriented and able to provide history. Patient is a retired Engineer, civil (consulting). Patient has extensive health history including pulmonary hypertensive disease, heart failure, hypertension, history of left lower leg DVT, lupus, diaphragmatic hernia, nocturnal hypoxia, gerd, diabetes, hypothyroidism, history of left breast cancer, anemia, history of falls, lymphedema and chronic kidney disease stage 3. Patient has [facility] making home visits for wound care on bilateral legs. Patient goes to wound center once a week to have legs assessed and wrapped.  Patient is weak and is requiring more care. Son feels patient has declined over the past two months. Patient also had esophageal strictures as well as food impaction in esophagus. Patient was hospitalized twice in June for tightening of esophagus and esophagus being closed as well as diverticulitis. Patient has lost at least 20 lbs in a year. Patient reports her appetite is poor and she typically eats 2 cups of food per day. Patient uses rollator walker when ambulating and denies suffering any recent falls. Patients breath  sounds are clear, patient has shortness of breath on exertion. Patient uses O2 at night at 2 liters per minute. Patient reports having a productive cough of pale yellow sputum. Patient has lymphedema in lower extremities. Legs are wrapped with compression wraps covering wraps for wounds and edema in legs.   Nurse did not remove dressings as Home Health nurse takes care of wound and son applies compression wraps. Patient on Percocet and duragesic patch for pain. Patient also complains of frequent nausea and vomiting. Patient has zofran and Compazine in the home. Patient reports getting relief from nausea with taking Compazine. Patient reports she sleeps well at night. Patient also on coumadin and patients INR check this week was 2.2. Son performs INR checks. Nurse feels patient would qualify for Hospice Services. Social worker and nurse provided education on hospice versus palliative care. Patient states she is not ready to be referred to hospice. Son encouraged patient to accept hospice evaluation however patient was reluctant. Patient in agreement with palliative care services and wants to continue with Home Health Services. Support provided to patient and son and both were encouraged to contact palliative care with questions or concerns.     HISTORY OF PRESENT ILLNESS:    CODE STATUS: Full Code  ADVANCED DIRECTIVES: Y MOST FORM: No PPS: 40%   PHYSICAL EXAM:   VITALS:There were no vitals filed for this visit.  LUNGS: clear to auscultation  CARDIAC: Cor RRR  EXTREMITIES: 3+ and pitting edema SKIN: Skin color, texture, turgor normal. No rashes or lesions  NEURO: positive for gait problems and weakness       Glory Rosebush, RN

## 2019-05-23 NOTE — Telephone Encounter (Signed)
Spoke with pt and informed her of her INR results and that she needs to continue her current regimen. Pt gave a verbal understanding.

## 2019-05-24 ENCOUNTER — Telehealth: Payer: Self-pay | Admitting: Internal Medicine

## 2019-05-24 ENCOUNTER — Telehealth: Payer: Self-pay

## 2019-05-24 DIAGNOSIS — I2729 Other secondary pulmonary hypertension: Secondary | ICD-10-CM

## 2019-05-24 DIAGNOSIS — C50419 Malignant neoplasm of upper-outer quadrant of unspecified female breast: Secondary | ICD-10-CM

## 2019-05-24 DIAGNOSIS — B259 Cytomegaloviral disease, unspecified: Secondary | ICD-10-CM

## 2019-05-24 DIAGNOSIS — K208 Other esophagitis without bleeding: Secondary | ICD-10-CM

## 2019-05-24 DIAGNOSIS — Z7901 Long term (current) use of anticoagulants: Secondary | ICD-10-CM

## 2019-05-24 DIAGNOSIS — M3213 Lung involvement in systemic lupus erythematosus: Secondary | ICD-10-CM

## 2019-05-24 DIAGNOSIS — I82509 Chronic embolism and thrombosis of unspecified deep veins of unspecified lower extremity: Secondary | ICD-10-CM

## 2019-05-24 NOTE — Telephone Encounter (Signed)
Yes  Can give verbal order , but I will place in Epic

## 2019-05-24 NOTE — Telephone Encounter (Signed)
Is it okay to give verbal order for hospice evaluation?

## 2019-05-24 NOTE — Telephone Encounter (Signed)
Diana [name] calling back to check status. Requesting call back

## 2019-05-24 NOTE — Telephone Encounter (Signed)
Telephone call from patient's son Diana [name].  Diana [name] states his Mom is agreeable to hospice evaluation.  Diana [name] informed palliative care team yesterday patient has ben declining since June.  RN called medical director Diana [name] and patient's primary MD Diana [name].  Diana [name] in agreement with hospice evaluation for patient.  RN waiting on call back from Diana [name] for consent for hospice evaluation and to determine if Diana [name] will sign hospice orders for patient.

## 2019-05-24 NOTE — Telephone Encounter (Signed)
Virgie, from palliative care, called and is requesting to have a hospice evaluation for pt. Please advise.   [phone] secure line

## 2019-05-26 DIAGNOSIS — D500 Iron deficiency anemia secondary to blood loss (chronic): Secondary | ICD-10-CM | POA: Diagnosis not present

## 2019-05-26 DIAGNOSIS — I5042 Chronic combined systolic (congestive) and diastolic (congestive) heart failure: Secondary | ICD-10-CM | POA: Diagnosis not present

## 2019-05-26 DIAGNOSIS — M810 Age-related osteoporosis without current pathological fracture: Secondary | ICD-10-CM | POA: Diagnosis not present

## 2019-05-26 DIAGNOSIS — I872 Venous insufficiency (chronic) (peripheral): Secondary | ICD-10-CM | POA: Diagnosis not present

## 2019-05-26 DIAGNOSIS — G609 Hereditary and idiopathic neuropathy, unspecified: Secondary | ICD-10-CM | POA: Diagnosis not present

## 2019-05-26 DIAGNOSIS — Z8701 Personal history of pneumonia (recurrent): Secondary | ICD-10-CM | POA: Diagnosis not present

## 2019-05-26 DIAGNOSIS — I130 Hypertensive heart and chronic kidney disease with heart failure and stage 1 through stage 4 chronic kidney disease, or unspecified chronic kidney disease: Secondary | ICD-10-CM | POA: Diagnosis not present

## 2019-05-26 DIAGNOSIS — K210 Gastro-esophageal reflux disease with esophagitis: Secondary | ICD-10-CM | POA: Diagnosis not present

## 2019-05-26 DIAGNOSIS — M419 Scoliosis, unspecified: Secondary | ICD-10-CM | POA: Diagnosis not present

## 2019-05-26 DIAGNOSIS — Z853 Personal history of malignant neoplasm of breast: Secondary | ICD-10-CM | POA: Diagnosis not present

## 2019-05-26 DIAGNOSIS — N183 Chronic kidney disease, stage 3 (moderate): Secondary | ICD-10-CM | POA: Diagnosis not present

## 2019-05-26 DIAGNOSIS — E782 Mixed hyperlipidemia: Secondary | ICD-10-CM | POA: Diagnosis not present

## 2019-05-26 DIAGNOSIS — G8929 Other chronic pain: Secondary | ICD-10-CM | POA: Diagnosis not present

## 2019-05-26 DIAGNOSIS — M5441 Lumbago with sciatica, right side: Secondary | ICD-10-CM | POA: Diagnosis not present

## 2019-05-26 DIAGNOSIS — I890 Lymphedema, not elsewhere classified: Secondary | ICD-10-CM | POA: Diagnosis not present

## 2019-05-26 DIAGNOSIS — Z4800 Encounter for change or removal of nonsurgical wound dressing: Secondary | ICD-10-CM | POA: Diagnosis not present

## 2019-05-26 DIAGNOSIS — I270 Primary pulmonary hypertension: Secondary | ICD-10-CM | POA: Diagnosis not present

## 2019-05-26 DIAGNOSIS — M4697 Unspecified inflammatory spondylopathy, lumbosacral region: Secondary | ICD-10-CM | POA: Diagnosis not present

## 2019-05-26 DIAGNOSIS — L97822 Non-pressure chronic ulcer of other part of left lower leg with fat layer exposed: Secondary | ICD-10-CM | POA: Diagnosis not present

## 2019-05-26 DIAGNOSIS — E1122 Type 2 diabetes mellitus with diabetic chronic kidney disease: Secondary | ICD-10-CM | POA: Diagnosis not present

## 2019-05-26 DIAGNOSIS — L97812 Non-pressure chronic ulcer of other part of right lower leg with fat layer exposed: Secondary | ICD-10-CM | POA: Diagnosis not present

## 2019-05-26 DIAGNOSIS — E1151 Type 2 diabetes mellitus with diabetic peripheral angiopathy without gangrene: Secondary | ICD-10-CM | POA: Diagnosis not present

## 2019-05-26 DIAGNOSIS — M3213 Lung involvement in systemic lupus erythematosus: Secondary | ICD-10-CM | POA: Diagnosis not present

## 2019-05-26 DIAGNOSIS — I4891 Unspecified atrial fibrillation: Secondary | ICD-10-CM | POA: Diagnosis not present

## 2019-05-26 DIAGNOSIS — Z86718 Personal history of other venous thrombosis and embolism: Secondary | ICD-10-CM | POA: Diagnosis not present

## 2019-05-28 ENCOUNTER — Telehealth: Payer: Self-pay

## 2019-05-28 DIAGNOSIS — Z87891 Personal history of nicotine dependence: Secondary | ICD-10-CM

## 2019-05-28 DIAGNOSIS — I5042 Chronic combined systolic (congestive) and diastolic (congestive) heart failure: Secondary | ICD-10-CM

## 2019-05-28 DIAGNOSIS — E782 Mixed hyperlipidemia: Secondary | ICD-10-CM

## 2019-05-28 DIAGNOSIS — I270 Primary pulmonary hypertension: Secondary | ICD-10-CM

## 2019-05-28 DIAGNOSIS — Z8701 Personal history of pneumonia (recurrent): Secondary | ICD-10-CM

## 2019-05-28 DIAGNOSIS — G609 Hereditary and idiopathic neuropathy, unspecified: Secondary | ICD-10-CM

## 2019-05-28 DIAGNOSIS — I890 Lymphedema, not elsewhere classified: Secondary | ICD-10-CM | POA: Diagnosis not present

## 2019-05-28 DIAGNOSIS — D500 Iron deficiency anemia secondary to blood loss (chronic): Secondary | ICD-10-CM

## 2019-05-28 DIAGNOSIS — M3213 Lung involvement in systemic lupus erythematosus: Secondary | ICD-10-CM | POA: Diagnosis not present

## 2019-05-28 DIAGNOSIS — L97812 Non-pressure chronic ulcer of other part of right lower leg with fat layer exposed: Secondary | ICD-10-CM | POA: Diagnosis not present

## 2019-05-28 DIAGNOSIS — Z79891 Long term (current) use of opiate analgesic: Secondary | ICD-10-CM

## 2019-05-28 DIAGNOSIS — E1122 Type 2 diabetes mellitus with diabetic chronic kidney disease: Secondary | ICD-10-CM

## 2019-05-28 DIAGNOSIS — N183 Chronic kidney disease, stage 3 (moderate): Secondary | ICD-10-CM

## 2019-05-28 DIAGNOSIS — Z7901 Long term (current) use of anticoagulants: Secondary | ICD-10-CM

## 2019-05-28 DIAGNOSIS — I130 Hypertensive heart and chronic kidney disease with heart failure and stage 1 through stage 4 chronic kidney disease, or unspecified chronic kidney disease: Secondary | ICD-10-CM

## 2019-05-28 DIAGNOSIS — Z853 Personal history of malignant neoplasm of breast: Secondary | ICD-10-CM

## 2019-05-28 DIAGNOSIS — I872 Venous insufficiency (chronic) (peripheral): Secondary | ICD-10-CM

## 2019-05-28 DIAGNOSIS — I4891 Unspecified atrial fibrillation: Secondary | ICD-10-CM

## 2019-05-28 DIAGNOSIS — G8929 Other chronic pain: Secondary | ICD-10-CM

## 2019-05-28 DIAGNOSIS — Z86718 Personal history of other venous thrombosis and embolism: Secondary | ICD-10-CM

## 2019-05-28 DIAGNOSIS — M419 Scoliosis, unspecified: Secondary | ICD-10-CM

## 2019-05-28 DIAGNOSIS — M810 Age-related osteoporosis without current pathological fracture: Secondary | ICD-10-CM

## 2019-05-28 DIAGNOSIS — Z9981 Dependence on supplemental oxygen: Secondary | ICD-10-CM

## 2019-05-28 DIAGNOSIS — L97822 Non-pressure chronic ulcer of other part of left lower leg with fat layer exposed: Secondary | ICD-10-CM | POA: Diagnosis not present

## 2019-05-28 DIAGNOSIS — Z4800 Encounter for change or removal of nonsurgical wound dressing: Secondary | ICD-10-CM | POA: Diagnosis not present

## 2019-05-28 DIAGNOSIS — M4697 Unspecified inflammatory spondylopathy, lumbosacral region: Secondary | ICD-10-CM

## 2019-05-28 DIAGNOSIS — E1151 Type 2 diabetes mellitus with diabetic peripheral angiopathy without gangrene: Secondary | ICD-10-CM

## 2019-05-28 DIAGNOSIS — K210 Gastro-esophageal reflux disease with esophagitis: Secondary | ICD-10-CM

## 2019-05-28 DIAGNOSIS — M5441 Lumbago with sciatica, right side: Secondary | ICD-10-CM

## 2019-05-28 NOTE — Telephone Encounter (Signed)
Telephone to Dr Lupita Dawn office to follow up on phone call from last week.  Dr Derrel Nip gave verbal order for hospice evaluation for patient. RN updated medical director Dr Clifton James.  RN called patient's son Diana [name] and informed him referral intake would be contacting him or his mother to schedule time for hospice evaluation.

## 2019-05-28 NOTE — Assessment & Plan Note (Signed)
  Your INR/coumadin level from sept 2  Of 2.2  is therapeutic,  Continue current regimen and repeat PT/INR in one week

## 2019-05-28 NOTE — Telephone Encounter (Signed)
Virgie calling back and would like to see if Dr Derrel Nip will be attending of record.

## 2019-05-28 NOTE — Telephone Encounter (Signed)
  Your INR/coumadin level from sept 2  Of 2.2  is therapeutic,  Continue current regimen and repeat PT/INR in one week

## 2019-05-29 ENCOUNTER — Ambulatory Visit: Payer: Medicare Other | Admitting: Internal Medicine

## 2019-05-29 DIAGNOSIS — Z7901 Long term (current) use of anticoagulants: Secondary | ICD-10-CM | POA: Diagnosis not present

## 2019-05-29 LAB — POCT INR: INR: 3 — AB (ref 0.9–1.1)

## 2019-05-29 NOTE — Telephone Encounter (Signed)
Received a fax stating that pt has turned down Hospice care and would like to continue aggressive care and continue with palliative care at this time.

## 2019-05-29 NOTE — Telephone Encounter (Signed)
Virgie from Hospice is wanting to know if you would like to still be pt's attending physician?

## 2019-05-29 NOTE — Telephone Encounter (Signed)
Yes,

## 2019-05-30 ENCOUNTER — Telehealth: Payer: Self-pay

## 2019-05-30 ENCOUNTER — Other Ambulatory Visit: Payer: Self-pay | Admitting: Internal Medicine

## 2019-05-30 DIAGNOSIS — B259 Cytomegaloviral disease, unspecified: Secondary | ICD-10-CM

## 2019-05-30 NOTE — Telephone Encounter (Signed)
Spoke with pt and informed her that her coumadin level is therapeutic and that she needs to continue taking 1mg  daily. Pt gave a verbal understanding.

## 2019-05-30 NOTE — Telephone Encounter (Signed)
Coumadin level is therapeutic,  Continue current regimen and repeat PT/INR in one week 

## 2019-05-30 NOTE — Telephone Encounter (Signed)
INR from 05/29/2019 is 3.0. Pt is currently taking 1mg  every other day.   Abstracted.

## 2019-05-31 ENCOUNTER — Other Ambulatory Visit: Payer: Self-pay

## 2019-06-03 ENCOUNTER — Encounter: Payer: Medicare Other | Attending: Physician Assistant | Admitting: Physician Assistant

## 2019-06-03 ENCOUNTER — Other Ambulatory Visit: Payer: Medicare Other

## 2019-06-03 ENCOUNTER — Telehealth: Payer: Self-pay | Admitting: Internal Medicine

## 2019-06-03 ENCOUNTER — Other Ambulatory Visit: Payer: Self-pay

## 2019-06-03 ENCOUNTER — Inpatient Hospital Stay: Payer: Medicare Other | Attending: Oncology

## 2019-06-03 ENCOUNTER — Inpatient Hospital Stay: Payer: Medicare Other

## 2019-06-03 DIAGNOSIS — N170 Acute kidney failure with tubular necrosis: Secondary | ICD-10-CM | POA: Diagnosis not present

## 2019-06-03 DIAGNOSIS — S42292A Other displaced fracture of upper end of left humerus, initial encounter for closed fracture: Secondary | ICD-10-CM | POA: Diagnosis not present

## 2019-06-03 DIAGNOSIS — R4020 Unspecified coma: Secondary | ICD-10-CM | POA: Diagnosis not present

## 2019-06-03 DIAGNOSIS — R0602 Shortness of breath: Secondary | ICD-10-CM | POA: Diagnosis not present

## 2019-06-03 DIAGNOSIS — L97812 Non-pressure chronic ulcer of other part of right lower leg with fat layer exposed: Secondary | ICD-10-CM | POA: Diagnosis not present

## 2019-06-03 DIAGNOSIS — R6521 Severe sepsis with septic shock: Secondary | ICD-10-CM | POA: Diagnosis not present

## 2019-06-03 DIAGNOSIS — J9601 Acute respiratory failure with hypoxia: Secondary | ICD-10-CM | POA: Diagnosis not present

## 2019-06-03 DIAGNOSIS — J189 Pneumonia, unspecified organism: Secondary | ICD-10-CM | POA: Diagnosis not present

## 2019-06-03 DIAGNOSIS — R6520 Severe sepsis without septic shock: Secondary | ICD-10-CM | POA: Diagnosis not present

## 2019-06-03 DIAGNOSIS — D631 Anemia in chronic kidney disease: Secondary | ICD-10-CM

## 2019-06-03 DIAGNOSIS — I890 Lymphedema, not elsewhere classified: Secondary | ICD-10-CM | POA: Diagnosis not present

## 2019-06-03 DIAGNOSIS — Z20828 Contact with and (suspected) exposure to other viral communicable diseases: Secondary | ICD-10-CM | POA: Diagnosis not present

## 2019-06-03 DIAGNOSIS — L97822 Non-pressure chronic ulcer of other part of left lower leg with fat layer exposed: Secondary | ICD-10-CM | POA: Diagnosis not present

## 2019-06-03 DIAGNOSIS — S0990XA Unspecified injury of head, initial encounter: Secondary | ICD-10-CM | POA: Diagnosis not present

## 2019-06-03 DIAGNOSIS — N179 Acute kidney failure, unspecified: Secondary | ICD-10-CM | POA: Diagnosis not present

## 2019-06-03 DIAGNOSIS — K5730 Diverticulosis of large intestine without perforation or abscess without bleeding: Secondary | ICD-10-CM | POA: Diagnosis not present

## 2019-06-03 DIAGNOSIS — R52 Pain, unspecified: Secondary | ICD-10-CM | POA: Diagnosis not present

## 2019-06-03 DIAGNOSIS — A419 Sepsis, unspecified organism: Secondary | ICD-10-CM | POA: Diagnosis not present

## 2019-06-03 DIAGNOSIS — R000 Tachycardia, unspecified: Secondary | ICD-10-CM | POA: Diagnosis not present

## 2019-06-03 DIAGNOSIS — K6389 Other specified diseases of intestine: Secondary | ICD-10-CM | POA: Diagnosis not present

## 2019-06-03 DIAGNOSIS — Z751 Person awaiting admission to adequate facility elsewhere: Secondary | ICD-10-CM

## 2019-06-03 DIAGNOSIS — N200 Calculus of kidney: Secondary | ICD-10-CM | POA: Diagnosis not present

## 2019-06-03 DIAGNOSIS — N281 Cyst of kidney, acquired: Secondary | ICD-10-CM | POA: Diagnosis not present

## 2019-06-03 NOTE — Telephone Encounter (Signed)
Diana [name],  Please  [facility] in the morning and see if they can get Social Work to help place Diana [name] in rehab facility for fractured shoulder .  I sent a referral to [facility] but not sure if that is necessary

## 2019-06-03 NOTE — Progress Notes (Addendum)
Barrier cream - Zinc oxide Primary Wound Dressing: Wound #2 Right,Lateral Lower Leg: Hydrafera Blue Ready Transfer Secondary Dressing: Wound #2 Right,Lateral Lower Leg: ABD pad Dressing Change Frequency: Wound #2 Right,Lateral Lower Leg: Change Dressing Monday, Wednesday, Friday - Mondays-dressing changes in clinic. Wednesdays and Fridays- [facility] will do dressing changes Follow-up Appointments: Wound #2 Right,Lateral Lower Leg: Return Appointment in 1 week. Edema Control: Wound #2 Right,Lateral Lower Leg: Other: - [place] 4000 Wraps Off-Loading: Wound #2 Right,Lateral Lower Leg: Other: - Elevate legs when sitting Home Health: Wound #2 Right,Lateral Lower Leg: [place] for [place] Wednesday and Fridays. Home Health Nurse may visit PRN to address patient s wound care needs. FACE TO FACE ENCOUNTER: MEDICARE and MEDICAID PATIENTS: I certify that this patient is under my care and that I had a face-to-face encounter that meets the physician face-to-face encounter requirements with this patient on this date. The encounter with the patient was in whole or in part for the following MEDICAL CONDITION: (primary reason for [place]) MEDICAL NECESSITY: I certify, that based on my findings, NURSING services are a medically necessary  home health service. HOME BOUND STATUS: I certify that my clinical findings support that this patient is homebound (i.e., Due to illness or injury, pt requires aid of supportive devices such as crutches, cane, wheelchairs, walkers, the use of special transportation or the assistance of another person to leave their place of residence. There is a normal inability to leave the home and doing so requires considerable and taxing effort. Other absences are for medical reasons / religious services and are infrequent or of short duration when for other reasons). If current dressing causes regression in wound condition, may D/C ordered dressing product/s and apply Normal Saline Moist Dressing daily until next [place] / Other MD appointment. [place] of regression in wound condition at [phone]. Please direct any NON-WOUND related issues/requests for orders to patient's Primary Care Physician 1. At this point I do believe she is doing well with the [facility] will get a continue with that as again I am very happy in that regard. 2. I am also going to suggest that she continue with the [facility] wraps as they seem to be doing an excellent job for her. 3. I do recommend that she go to the orthopedic urgent care of already contacted them today they are expecting the patient and I think she needs to have the shoulder checked out ASAP as she cannot even move her left arm currently. She states is beginning to hurt more at this point. We will see patient back for reevaluation in 1 week here in the clinic. If anything worsens or changes patient will contact our office for additional recommendations. Electronic Signature(s) Signed: 06/03/2019 5:39:20 PM By: Worthy Keeler PA-C Entered By: Worthy Keeler on 06/03/2019 17:39:20 [name], Diana Sparrow ([id number]) [name], Diana Sparrow  ([id number]) -------------------------------------------------------------------------------- ROS/PFSH Details Patient Name: Diana [name] [name]. Date of Service: 06/03/2019 12:30 PM Medical Record Number: [id number] Patient Account Number: [id number] Date of Birth/Sex: 1935/01/27 (84 y.o. F) Treating RN: Harold Barban Primary Care Provider: Deborra Medina Other Clinician: Referring Provider: Deborra Medina Treating Provider/Extender: Melburn Hake, HOYT Weeks in Treatment: 5 Information Obtained From Patient Constitutional Symptoms (General Health) Complaints and Symptoms: Negative for: Fatigue; Fever; Chills; Marked Weight Change Medical History: Past Medical History Notes: Lupus (Lungs and Kidneys), DM II, HTN, CHF, Hypo thyroidism Respiratory Complaints and Symptoms: Negative for: Chronic  Barrier cream - Zinc oxide Primary Wound Dressing: Wound #2 Right,Lateral Lower Leg: Hydrafera Blue Ready Transfer Secondary Dressing: Wound #2 Right,Lateral Lower Leg: ABD pad Dressing Change Frequency: Wound #2 Right,Lateral Lower Leg: Change Dressing Monday, Wednesday, Friday - Mondays-dressing changes in clinic. Wednesdays and Fridays- [facility] will do dressing changes Follow-up Appointments: Wound #2 Right,Lateral Lower Leg: Return Appointment in 1 week. Edema Control: Wound #2 Right,Lateral Lower Leg: Other: - [place] 4000 Wraps Off-Loading: Wound #2 Right,Lateral Lower Leg: Other: - Elevate legs when sitting Home Health: Wound #2 Right,Lateral Lower Leg: [place] for [place] Wednesday and Fridays. Home Health Nurse may visit PRN to address patient s wound care needs. FACE TO FACE ENCOUNTER: MEDICARE and MEDICAID PATIENTS: I certify that this patient is under my care and that I had a face-to-face encounter that meets the physician face-to-face encounter requirements with this patient on this date. The encounter with the patient was in whole or in part for the following MEDICAL CONDITION: (primary reason for [place]) MEDICAL NECESSITY: I certify, that based on my findings, NURSING services are a medically necessary  home health service. HOME BOUND STATUS: I certify that my clinical findings support that this patient is homebound (i.e., Due to illness or injury, pt requires aid of supportive devices such as crutches, cane, wheelchairs, walkers, the use of special transportation or the assistance of another person to leave their place of residence. There is a normal inability to leave the home and doing so requires considerable and taxing effort. Other absences are for medical reasons / religious services and are infrequent or of short duration when for other reasons). If current dressing causes regression in wound condition, may D/C ordered dressing product/s and apply Normal Saline Moist Dressing daily until next [place] / Other MD appointment. [place] of regression in wound condition at [phone]. Please direct any NON-WOUND related issues/requests for orders to patient's Primary Care Physician 1. At this point I do believe she is doing well with the [facility] will get a continue with that as again I am very happy in that regard. 2. I am also going to suggest that she continue with the [facility] wraps as they seem to be doing an excellent job for her. 3. I do recommend that she go to the orthopedic urgent care of already contacted them today they are expecting the patient and I think she needs to have the shoulder checked out ASAP as she cannot even move her left arm currently. She states is beginning to hurt more at this point. We will see patient back for reevaluation in 1 week here in the clinic. If anything worsens or changes patient will contact our office for additional recommendations. Electronic Signature(s) Signed: 06/03/2019 5:39:20 PM By: Worthy Keeler PA-C Entered By: Worthy Keeler on 06/03/2019 17:39:20 [name], Diana Sparrow ([id number]) [name], Diana Sparrow  ([id number]) -------------------------------------------------------------------------------- ROS/PFSH Details Patient Name: Diana [name] [name]. Date of Service: 06/03/2019 12:30 PM Medical Record Number: [id number] Patient Account Number: [id number] Date of Birth/Sex: 1935/01/27 (84 y.o. F) Treating RN: Harold Barban Primary Care Provider: Deborra Medina Other Clinician: Referring Provider: Deborra Medina Treating Provider/Extender: Melburn Hake, HOYT Weeks in Treatment: 5 Information Obtained From Patient Constitutional Symptoms (General Health) Complaints and Symptoms: Negative for: Fatigue; Fever; Chills; Marked Weight Change Medical History: Past Medical History Notes: Lupus (Lungs and Kidneys), DM II, HTN, CHF, Hypo thyroidism Respiratory Complaints and Symptoms: Negative for: Chronic  Barrier cream - Zinc oxide Primary Wound Dressing: Wound #2 Right,Lateral Lower Leg: Hydrafera Blue Ready Transfer Secondary Dressing: Wound #2 Right,Lateral Lower Leg: ABD pad Dressing Change Frequency: Wound #2 Right,Lateral Lower Leg: Change Dressing Monday, Wednesday, Friday - Mondays-dressing changes in clinic. Wednesdays and Fridays- [facility] will do dressing changes Follow-up Appointments: Wound #2 Right,Lateral Lower Leg: Return Appointment in 1 week. Edema Control: Wound #2 Right,Lateral Lower Leg: Other: - [place] 4000 Wraps Off-Loading: Wound #2 Right,Lateral Lower Leg: Other: - Elevate legs when sitting Home Health: Wound #2 Right,Lateral Lower Leg: [place] for [place] Wednesday and Fridays. Home Health Nurse may visit PRN to address patient s wound care needs. FACE TO FACE ENCOUNTER: MEDICARE and MEDICAID PATIENTS: I certify that this patient is under my care and that I had a face-to-face encounter that meets the physician face-to-face encounter requirements with this patient on this date. The encounter with the patient was in whole or in part for the following MEDICAL CONDITION: (primary reason for [place]) MEDICAL NECESSITY: I certify, that based on my findings, NURSING services are a medically necessary  home health service. HOME BOUND STATUS: I certify that my clinical findings support that this patient is homebound (i.e., Due to illness or injury, pt requires aid of supportive devices such as crutches, cane, wheelchairs, walkers, the use of special transportation or the assistance of another person to leave their place of residence. There is a normal inability to leave the home and doing so requires considerable and taxing effort. Other absences are for medical reasons / religious services and are infrequent or of short duration when for other reasons). If current dressing causes regression in wound condition, may D/C ordered dressing product/s and apply Normal Saline Moist Dressing daily until next [place] / Other MD appointment. [place] of regression in wound condition at [phone]. Please direct any NON-WOUND related issues/requests for orders to patient's Primary Care Physician 1. At this point I do believe she is doing well with the [facility] will get a continue with that as again I am very happy in that regard. 2. I am also going to suggest that she continue with the [facility] wraps as they seem to be doing an excellent job for her. 3. I do recommend that she go to the orthopedic urgent care of already contacted them today they are expecting the patient and I think she needs to have the shoulder checked out ASAP as she cannot even move her left arm currently. She states is beginning to hurt more at this point. We will see patient back for reevaluation in 1 week here in the clinic. If anything worsens or changes patient will contact our office for additional recommendations. Electronic Signature(s) Signed: 06/03/2019 5:39:20 PM By: Worthy Keeler PA-C Entered By: Worthy Keeler on 06/03/2019 17:39:20 [name], Diana Sparrow ([id number]) [name], Diana Sparrow  ([id number]) -------------------------------------------------------------------------------- ROS/PFSH Details Patient Name: Diana [name] [name]. Date of Service: 06/03/2019 12:30 PM Medical Record Number: [id number] Patient Account Number: [id number] Date of Birth/Sex: 1935/01/27 (84 y.o. F) Treating RN: Harold Barban Primary Care Provider: Deborra Medina Other Clinician: Referring Provider: Deborra Medina Treating Provider/Extender: Melburn Hake, HOYT Weeks in Treatment: 5 Information Obtained From Patient Constitutional Symptoms (General Health) Complaints and Symptoms: Negative for: Fatigue; Fever; Chills; Marked Weight Change Medical History: Past Medical History Notes: Lupus (Lungs and Kidneys), DM II, HTN, CHF, Hypo thyroidism Respiratory Complaints and Symptoms: Negative for: Chronic  or frequent coughs; Shortness of Breath Cardiovascular Complaints and Symptoms: Negative for: Chest pain; LE edema Medical History: Positive for: Congestive Heart Failure Negative for: Hypertension Psychiatric Complaints and Symptoms: Negative for: Anxiety; Claustrophobia Eyes Medical History: Positive for: Cataracts - removed Ear/Nose/Mouth/Throat Medical History: Positive for: Chronic sinus problems/congestion Hematologic/Lymphatic Medical History: Positive for: Anemia - Injections every 3 weeks Endocrine Medical History: Positive for: Type II Diabetes - no medication taken JEANETTE, RAUTH [name]. ([id number]) Time with diabetes: Not sure Genitourinary Medical History: Positive for: End Stage Renal Disease - CKD3 Immunological Medical History: Positive for: Lupus Erythematosus Musculoskeletal Medical History: Past Medical History Notes: Pain in hip Neurologic Medical History: Positive for: Neuropathy Oncologic Medical History: Positive for: Received Radiation Negative for: Received Chemotherapy HBO Extended History Items Eyes: Ear/Nose/Mouth/Throat: Cataracts Chronic sinus  problems/congestion Immunizations Pneumococcal Vaccine: Received Pneumococcal Vaccination: No Implantable Devices None Family and Social History Cancer: Yes - Siblings; Diabetes: No; Heart Disease: Yes - Mother; Hypertension: No; Kidney Disease: No; Lung Disease: No; Seizures: No; Stroke: No; Thyroid Problems: No; Tuberculosis: Yes - Mother; Former smoker - Quit 40 years ago; Marital Status - Divorced; Alcohol Use: Rarely; Drug Use: No History; Caffeine Use: Moderate Physician Affirmation I have reviewed and agree with the above information. Electronic Signature(s) Signed: 06/03/2019 4:31:37 PM By: Harold Barban Signed: 06/03/2019 5:45:18 PM By: Worthy Keeler PA-C Entered By: Worthy Keeler on 06/03/2019 13:18:34 Dolby, Diana [name] Kitchen ([id number]) -------------------------------------------------------------------------------- SuperBill Details Patient Name: Cast, Diana [name]. Date of Service: 06/03/2019 Medical Record Number: [id number] Patient Account Number: [id number] Date of Birth/Sex: 1934-10-10 (84 y.o. F) Treating RN: Harold Barban Primary Care Provider: Deborra Medina Other Clinician: Referring Provider: Deborra Medina Treating Provider/Extender: Melburn Hake, HOYT Weeks in Treatment: 5 Diagnosis Coding ICD-10 Codes Code Description I89.0 Lymphedema, not elsewhere classified L97.812 Non-pressure chronic ulcer of other part of right lower leg with fat layer exposed L97.822 Non-pressure chronic ulcer of other part of left lower leg with fat layer exposed I50.42 Chronic combined systolic (congestive) and diastolic (congestive) heart failure N18.3 Chronic kidney disease, stage 3 (moderate) D50.8 Other iron deficiency anemias I10 Essential (primary) hypertension Z79.01 Long term (current) use of anticoagulants M32.8 Other forms of systemic lupus erythematosus Facility Procedures CPT4 Code: [id number] Description: 99213 - WOUND CARE VISIT-LEV 3 EST PT Modifier: Quantity:  1 Physician Procedures CPT4 Code Description: 0321224 82500 - WC PHYS LEVEL 4 - EST PT ICD-10 Diagnosis Description I89.0 Lymphedema, not elsewhere classified L97.812 Non-pressure chronic ulcer of other part of right lower leg wi L97.822 Non-pressure chronic ulcer of other  part of left lower leg wit I50.42 Chronic combined systolic (congestive) and diastolic (congesti Modifier: th fat layer expo h fat layer expos ve) heart failure Quantity: 1 sed ed Electronic Signature(s) Signed: 06/03/2019 5:39:31 PM By: Worthy Keeler PA-C Entered By: Worthy Keeler on 06/03/2019 17:39:31  o Other: - Elevate legs when sitting Home Health Wound #2 [place] for [place] Wednesday and Fridays. o Home Health Nurse may visit PRN to address patientos wound care needs. o FACE TO FACE ENCOUNTER: MEDICARE and MEDICAID PATIENTS: I certify that this patient is under my care and that I had a face-to-face encounter that meets the physician face-to-face encounter requirements with this patient on this date. The encounter with the patient was in whole or in part for the following MEDICAL CONDITION: (primary reason for [place]) MEDICAL NECESSITY: I certify, that based on my findings, NURSING services are a medically necessary home health service. HOME BOUND STATUS: I certify that my clinical findings support that this patient is homebound (i.e., Due to illness or injury, pt requires aid of supportive devices such as crutches, cane, wheelchairs, walkers, the use of special transportation or the assistance of another person to leave their place of residence. There is a normal inability to leave the home and doing so requires considerable and taxing effort. Other absences are for medical reasons / religious services and are infrequent or of short duration when for other reasons). o If current dressing causes regression in wound condition, may D/C ordered dressing product/s and apply Normal Saline Moist Dressing daily until next [place] / Other MD appointment. [place] of regression in wound condition at [phone]. o Please direct any NON-WOUND related  issues/requests for orders to patient's Primary Care Physician Electronic Signature(s) Signed: 06/03/2019 4:31:37 PM By: Harold Barban Signed: 06/03/2019 5:45:18 PM By: Worthy Keeler PA-C Entered By: Harold Barban on 06/03/2019 13:04:50 Diana [name], Diana [name] Kitchen ([id number]) -------------------------------------------------------------------------------- Problem List Details Patient Name: Letterman, Diana [name]. Date of Service: 06/03/2019 12:30 PM Medical Record Number: [id number] Patient Account Number: [id number] Date of Birth/Sex: January 14, 1935 (84 y.o. F) Treating RN: Harold Barban Primary Care Provider: Deborra Medina Other Clinician: Referring Provider: Deborra Medina Treating Provider/Extender: Melburn Hake, HOYT Weeks in Treatment: 5 Active Problems ICD-10 Evaluated Encounter Code Description Active Date Today Diagnosis I89.0 Lymphedema, not elsewhere classified 04/29/2019 No Yes L97.812 Non-pressure chronic ulcer of other part of right lower leg 04/29/2019 No Yes with fat layer exposed L97.822 Non-pressure chronic ulcer of other part of left lower leg with 04/29/2019 No Yes fat layer exposed I50.42 Chronic combined systolic (congestive) and diastolic 5/99/3570 No Yes (congestive) heart failure N18.3 Chronic kidney disease, stage 3 (moderate) 04/29/2019 No Yes D50.8 Other iron deficiency anemias 04/29/2019 No Yes I10 Essential (primary) hypertension 04/29/2019 No Yes Z79.01 Long term (current) use of anticoagulants 04/29/2019 No Yes M32.8 Other forms of systemic lupus erythematosus 04/29/2019 No Yes Inactive Problems [place], Diana [name]. ([id number]) Resolved Problems Electronic Signature(s) Signed: 06/03/2019 12:56:22 PM By: Worthy Keeler PA-C Entered By: Worthy Keeler on 06/03/2019 12:56:21 Karwowski, Diana [name]. ([id number]) -------------------------------------------------------------------------------- Progress Note Details Patient Name: [place], Diana [name]. Date of Service: 06/03/2019 12:30  PM Medical Record Number: [id number] Patient Account Number: [id number] Date of Birth/Sex: 1935-06-05 (84 y.o. F) Treating RN: Harold Barban Primary Care Provider: Deborra Medina Other Clinician: Referring Provider: Deborra Medina Treating Provider/Extender: Melburn Hake, HOYT Weeks in Treatment: 5 Subjective Chief Complaint Information obtained from Patient Bilateral LE Ulcers and Severe Bilateral Lymphedema History of Present Illness (HPI) 04/29/2019 upon evaluation today patient presents for initial inspection her clinic concerning issues that she has been having with bilateral lower extremity edema for quite some time. She has unfortunately an extensive medical history  Barrier cream - Zinc oxide Primary Wound Dressing: Wound #2 Right,Lateral Lower Leg: Hydrafera Blue Ready Transfer Secondary Dressing: Wound #2 Right,Lateral Lower Leg: ABD pad Dressing Change Frequency: Wound #2 Right,Lateral Lower Leg: Change Dressing Monday, Wednesday, Friday - Mondays-dressing changes in clinic. Wednesdays and Fridays- [facility] will do dressing changes Follow-up Appointments: Wound #2 Right,Lateral Lower Leg: Return Appointment in 1 week. Edema Control: Wound #2 Right,Lateral Lower Leg: Other: - [place] 4000 Wraps Off-Loading: Wound #2 Right,Lateral Lower Leg: Other: - Elevate legs when sitting Home Health: Wound #2 Right,Lateral Lower Leg: [place] for [place] Wednesday and Fridays. Home Health Nurse may visit PRN to address patient s wound care needs. FACE TO FACE ENCOUNTER: MEDICARE and MEDICAID PATIENTS: I certify that this patient is under my care and that I had a face-to-face encounter that meets the physician face-to-face encounter requirements with this patient on this date. The encounter with the patient was in whole or in part for the following MEDICAL CONDITION: (primary reason for [place]) MEDICAL NECESSITY: I certify, that based on my findings, NURSING services are a medically necessary  home health service. HOME BOUND STATUS: I certify that my clinical findings support that this patient is homebound (i.e., Due to illness or injury, pt requires aid of supportive devices such as crutches, cane, wheelchairs, walkers, the use of special transportation or the assistance of another person to leave their place of residence. There is a normal inability to leave the home and doing so requires considerable and taxing effort. Other absences are for medical reasons / religious services and are infrequent or of short duration when for other reasons). If current dressing causes regression in wound condition, may D/C ordered dressing product/s and apply Normal Saline Moist Dressing daily until next [place] / Other MD appointment. [place] of regression in wound condition at [phone]. Please direct any NON-WOUND related issues/requests for orders to patient's Primary Care Physician 1. At this point I do believe she is doing well with the [facility] will get a continue with that as again I am very happy in that regard. 2. I am also going to suggest that she continue with the [facility] wraps as they seem to be doing an excellent job for her. 3. I do recommend that she go to the orthopedic urgent care of already contacted them today they are expecting the patient and I think she needs to have the shoulder checked out ASAP as she cannot even move her left arm currently. She states is beginning to hurt more at this point. We will see patient back for reevaluation in 1 week here in the clinic. If anything worsens or changes patient will contact our office for additional recommendations. Electronic Signature(s) Signed: 06/03/2019 5:39:20 PM By: Worthy Keeler PA-C Entered By: Worthy Keeler on 06/03/2019 17:39:20 [name], Diana Sparrow ([id number]) [name], Diana Sparrow  ([id number]) -------------------------------------------------------------------------------- ROS/PFSH Details Patient Name: Diana [name] [name]. Date of Service: 06/03/2019 12:30 PM Medical Record Number: [id number] Patient Account Number: [id number] Date of Birth/Sex: 1935/01/27 (84 y.o. F) Treating RN: Harold Barban Primary Care Provider: Deborra Medina Other Clinician: Referring Provider: Deborra Medina Treating Provider/Extender: Melburn Hake, HOYT Weeks in Treatment: 5 Information Obtained From Patient Constitutional Symptoms (General Health) Complaints and Symptoms: Negative for: Fatigue; Fever; Chills; Marked Weight Change Medical History: Past Medical History Notes: Lupus (Lungs and Kidneys), DM II, HTN, CHF, Hypo thyroidism Respiratory Complaints and Symptoms: Negative for: Chronic  right hand to actually move her left arm around during the evaluation today. She is also having pain up in the shoulder region especially anteriorly. The good news is again that her wounds seem to be doing great in fact her left lower extremity is healed the right lower extremity is very close. He is tolerating the Velcro compression wraps without complication. Electronic Signature(s) Signed: 06/03/2019 1:12:48 PM By: Worthy Keeler PA-C Entered By: Worthy Keeler on 06/03/2019 13:12:48 [name], Diana Sparrow ([id number]) -------------------------------------------------------------------------------- Physical Exam Details Patient Name: Diana [name], Diana [name]. Date of Service: 06/03/2019 12:30 PM Medical Record Number: [id number] Patient Account Number: [id number] Date of Birth/Sex: [birth year] (84 y.o. F) Treating RN: Harold Barban Primary Care Provider: Deborra Medina Other Clinician: Referring Provider: Deborra Medina Treating Provider/Extender: Melburn Hake, HOYT Weeks in Treatment: 5 Constitutional Well-nourished and well-hydrated in no acute distress. Respiratory normal breathing without difficulty. clear to auscultation bilaterally. Cardiovascular regular rate and rhythm with normal S1, S2. Psychiatric this patient is able to make decisions and demonstrates good insight into disease process. Alert and Oriented x 3. pleasant and cooperative. Notes Patient's wounds actually appear to be doing quite well at this point in time today and I am very pleased in this regard. Unfortunately due to the fall I am more concerned about her shoulder I did attempt to her shoulder during her visit today to see if I could palpate or feel anything abnormal. I am not so sure that her clavicle is not somewhat displaced on the right shoulder towards the distal end. Nonetheless I really think she needs complete evaluation and treatment by an orthopedic specialist and this is something I am going to refer her to  today. I called [facility] and they actually will see her today on a walk-in basis I sent her straight over there from here. Electronic Signature(s) Signed: 06/03/2019 5:38:38 PM By: Worthy Keeler PA-C Entered By: Worthy Keeler on 06/03/2019 17:38:37 Penado, Diana Sparrow ([id number]) -------------------------------------------------------------------------------- Physician Orders Details Patient Name: Diana [name] [name]. Date of Service: 06/03/2019 12:30 PM Medical Record Number: [id number] Patient Account Number: [id number] Date of Birth/Sex: 1935-04-26 (84 y.o. F) Treating RN: Harold Barban Primary Care Provider: Deborra Medina Other Clinician: Referring Provider: Deborra Medina Treating Provider/Extender: Melburn Hake, HOYT Weeks in Treatment: 5 Verbal / Phone Orders: No Diagnosis Coding ICD-10 Coding Code Description I89.0 Lymphedema, not elsewhere classified L97.812 Non-pressure chronic ulcer of other part of right lower leg with fat layer exposed L97.822 Non-pressure chronic ulcer of other part of left lower leg with fat layer exposed I50.42 Chronic combined systolic (congestive) and diastolic (congestive) heart failure N18.3 Chronic kidney disease, stage 3 (moderate) D50.8 Other iron deficiency anemias I10 Essential (primary) hypertension Z79.01 Long term (current) use of anticoagulants M32.8 Other forms of systemic lupus erythematosus Wound Cleansing Wound #2 Right,Lateral Lower Leg o Dial antibacterial soap, wash wounds, rinse and pat dry prior to dressing wounds Skin Barriers/Peri-Wound Care Wound #2 Right,Lateral Lower Leg o Barrier cream - Zinc oxide Primary Wound Dressing Wound #2 Right,Lateral Lower Leg o Hydrafera Blue Ready Transfer Secondary Dressing Wound #2 Right,Lateral Lower Leg o ABD pad Dressing Change Frequency Wound #2 Right,Lateral Lower Leg o Change Dressing Monday, Wednesday, Friday - Mondays-dressing changes in clinic. Wednesdays and  Fridays- [place] will do dressing changes Follow-up Appointments Wound #2 Right,Lateral Lower Leg o Return Appointment in 1 week. Edema Control Wound #2 Right,Lateral Lower Leg Geraghty, Lizzeth [name]. ([id number]) o Other: - [place] [address] Wraps Off-Loading Wound #2 Right,Lateral Lower Leg 

## 2019-06-03 NOTE — Telephone Encounter (Signed)
Spoke with pt's son and he stated that his mother is needing a little more help during the day, he thinks maybe a couple hours a day. I explained to pt that home health agencies don't offer services like that, that if he wanted someone to sit with her several hours a day that he would have to pay for it out of pocket. Son asked about rehab and seeing if pt could go to rehab for couple weeks until she is able to use her arm again. He stated that she is not able to get up and down on her own anymore, has to be assisted with going to the bathroom because she has only one arm to use.

## 2019-06-03 NOTE — Progress Notes (Addendum)
Appeared Gradually Appeared N/A Primary Etiology: Lymphedema Trauma, Other N/A Secondary Etiology: Diabetic Wound/Ulcer of the Lymphedema N/A Lower Extremity Comorbid History: Cataracts, Chronic sinus Cataracts, Chronic sinus N/A problems/congestion, Anemia, problems/congestion, Anemia, Congestive Heart Failure, Congestive Heart Failure, Type II Diabetes, End Stage Type II Diabetes, End Stage Renal Disease, Lupus Renal Disease, Lupus Erythematosus, Neuropathy, Erythematosus, Neuropathy, Received Radiation Received Radiation Date Acquired: 03/18/2019 04/26/2019 N/A Weeks of Treatment: 5 5 N/A Wound Status: Open Open N/A Measurements L x W x D 0.1x0.1x0.1 0.4x0.8x0.1 N/A (cm) Area (cm) : 0.008 0.251 N/A Volume (cm) : 0.001 0.025 N/A % Reduction in Area: 99.80% 60.00% N/A % Reduction in Volume: 99.70% 86.70% N/A Classification: Full Thickness Without Full Thickness Without N/A Exposed Support Structures Exposed Support Structures Exudate Amount: Medium Medium N/A Exudate Type: Serous Serous N/A Exudate Color: amber amber N/A Wound Margin: Indistinct, nonvisible Flat and Intact N/A Granulation Amount: Large (67-100%) Large (67-100%) N/A Granulation Quality: Pink Pink N/A Hornberger, Janiyla B. ([id number]) Necrotic Amount: Small (1-33%) Small (1-33%) N/A Exposed Structures: Fat Layer (Subcutaneous Fat Layer (Subcutaneous  N/A Tissue) Exposed: Yes Tissue) Exposed: Yes Fascia: No Fascia: No Tendon: No Tendon: No Muscle: No Muscle: No Joint: No Joint: No Bone: No Bone: No Epithelialization: Small (1-33%) Medium (34-66%) N/A Treatment Notes Electronic Signature(s) Signed: 06/03/2019 4:31:37 PM By: Harold Barban Entered By: Harold Barban on 06/03/2019 12:58:38 Shain, [name] ([id number]) -------------------------------------------------------------------------------- [place] Details Patient Name: [name] Date of Service: 06/03/2019 12:30 PM Medical Record Number: [id number] Patient Account Number: [id number] Date of Birth/Sex: 1935/04/02 (84 y.o. F) Treating RN: Harold Barban Primary Care Emme Rosenau: Deborra Medina Other Clinician: Referring Blayn Whetsell: Deborra Medina Treating Elvert Cumpton/Extender: Melburn Hake, HOYT Weeks in Treatment: 5 Active Inactive Electronic Signature(s) Signed: 06/11/2019 1:01:38 PM By: Harold Barban Previous Signature: 06/03/2019 4:31:37 PM Version By: Harold Barban Entered By: Harold Barban on 06/11/2019 13:01:38 [place], [place] City[place] ([id number]) -------------------------------------------------------------------------------- Pain Assessment Details Patient Name: Zinda, Dechelle B. Date of Service: 06/03/2019 12:30 PM Medical Record Number: [id number] Patient Account Number: [id number] Date of Birth/Sex: 1935/02/01 (84 y.o. F) Treating RN: Harold Barban Primary Care Raymir Frommelt: Deborra Medina Other Clinician: Referring Mostafa Yuan: Deborra Medina Treating Normalee Sistare/Extender: Melburn Hake, HOYT Weeks in Treatment: 5 Active Problems Location of Pain Severity and Description of Pain Patient Has Paino No Site Locations Pain Management and Medication Current Pain Management: Electronic Signature(s) Signed: 06/03/2019 3:43:02 PM By: Lorine Bears RCP, RRT, CHT Signed: 06/03/2019 4:31:37 PM By: Harold Barban Entered By: Lorine Bears on 06/03/2019 12:51:36 Kedzierski, [name] ([id number]) -------------------------------------------------------------------------------- Patient/Caregiver Education Details Patient Name: [name] Date of Service: 06/03/2019 12:30 PM Medical Record Number: [id number] Patient Account Number: [id number] Date of Birth/Gender: Jul 08, 1935 (84 y.o. F) Treating RN: Harold Barban Primary Care Physician: Deborra Medina Other Clinician: Referring Physician: Deborra Medina Treating Physician/Extender: Melburn Hake, HOYT Weeks in Treatment: 5 Education Assessment Education Provided To: Patient Education Topics Provided Wound/Skin Impairment: Handouts: Caring for Your Ulcer Methods: Demonstration, Explain/Verbal Responses: State content correctly Electronic Signature(s) Signed: 06/03/2019 4:31:37 PM By: Harold Barban Entered By: Harold Barban on 06/03/2019 12:59:05 Hollenbach, [name] ([id number]) -------------------------------------------------------------------------------- Wound Assessment Details Patient Name: Gann, Koya B. Date of Service: 06/03/2019 12:30 PM Medical Record Number: [id number] Patient Account Number: [id number] Date of Birth/Sex: 04/30/1935 (84 y.o. F) Treating RN: Harold Barban Primary Care Deaisa Merida: Deborra Medina Other Clinician: Referring Patsy Zaragoza: Deborra Medina Treating Brennen Camper/Extender: Melburn Hake, HOYT Weeks in Treatment: 5 Wound Status Wound Number: 1 Primary Lymphedema Etiology: Wound Location: Left, Anterior Lower Leg  FAWNE, HUGHLEY ([id number]) Visit Report for 06/03/2019 Arrival Information Details Patient Name: SALIDO, Haniyah B. Date of Service: 06/03/2019 12:30 PM Medical Record Number: [id number] Patient Account Number: [id number] Date of Birth/Sex: 1935-05-26 (84 y.o. F) Treating RN: Harold Barban Primary Care Sibbie Flammia: Deborra Medina Other Clinician: Referring Kaytlin Burklow: Deborra Medina Treating Otie Headlee/Extender: Melburn Hake, HOYT Weeks in Treatment: 5 Visit Information History Since Last Visit Added or deleted any medications: No Patient Arrived: Wheel Chair Any new allergies or adverse reactions: No Arrival Time: 12:46 Had a fall or experienced change in Yes Accompanied By: son activities of daily living that may affect Transfer Assistance: Manual risk of falls: Patient Identification Verified: Yes Signs or symptoms of abuse/neglect since last visito No Secondary Verification Process Yes Hospitalized since last visit: No Completed: Implantable device outside of the clinic excluding No Patient Requires Transmission-Based No cellular tissue based products placed in the center Precautions: since last visit: Patient Has Alerts: Yes Has Dressing in Place as Prescribed: Yes Patient Alerts: Patient on Blood Has Compression in Place as Prescribed: Yes Thinner Pain Present Now: No Warfarin Type II Diabetic Notes Patient fell on the side walk outside her house today and hurt her left arm. Electronic Signature(s) Signed: 06/03/2019 3:43:02 PM By: Lorine Bears RCP, RRT, CHT Entered By: Lorine Bears on 06/03/2019 12:51:25 Aja, [name] ([id number]) -------------------------------------------------------------------------------- Clinic Level of Care Assessment Details Patient Name: Stumpp, Layli B. Date of Service: 06/03/2019 12:30 PM Medical Record Number: [id number] Patient Account Number: [id number] Date of Birth/Sex: 1935-08-19 (84 y.o. F) Treating  RN: Harold Barban Primary Care Domanic Matusek: Deborra Medina Other Clinician: Referring Giuseppe Duchemin: Deborra Medina Treating Thedford Bunton/Extender: Melburn Hake, HOYT Weeks in Treatment: 5 Clinic Level of Care Assessment Items TOOL 4 Quantity Score []  - Use when only an EandM is performed on FOLLOW-UP visit 0 ASSESSMENTS - Nursing Assessment / Reassessment X - Reassessment of Co-morbidities (includes updates in patient status) 1 10 X- 1 5 Reassessment of Adherence to Treatment Plan ASSESSMENTS - Wound and Skin Assessment / Reassessment X - Simple Wound Assessment / Reassessment - one wound 1 5 []  - 0 Complex Wound Assessment / Reassessment - multiple wounds []  - 0 Dermatologic / Skin Assessment (not related to wound area) ASSESSMENTS - Focused Assessment []  - Circumferential Edema Measurements - multi extremities 0 []  - 0 Nutritional Assessment / Counseling / Intervention []  - 0 Lower Extremity Assessment (monofilament, tuning fork, pulses) []  - 0 Peripheral Arterial Disease Assessment (using hand held doppler) ASSESSMENTS - Ostomy and/or Continence Assessment and Care []  - Incontinence Assessment and Management 0 []  - 0 Ostomy Care Assessment and Management (repouching, etc.) PROCESS - Coordination of Care X - Simple Patient / Family Education for ongoing care 1 15 []  - 0 Complex (extensive) Patient / Family Education for ongoing care []  - 0 Staff obtains Programmer, systems, Records, Test Results / Process Orders X- 1 10 Staff telephones HHA, Nursing Homes / Clarify orders / etc []  - 0 Routine Transfer to another Facility (non-emergent condition) []  - 0 Routine Hospital Admission (non-emergent condition) []  - 0 New Admissions / Biomedical engineer / Ordering NPWT, Apligraf, etc. []  - 0 Emergency Hospital Admission (emergent condition) X- 1 10 Simple Discharge Coordination Gracy, Nechuma B. ([id number]) []  - 0 Complex (extensive) Discharge Coordination PROCESS - Special Needs []  -  Pediatric / Minor Patient Management 0 []  - 0 Isolation Patient Management []  - 0 Hearing / Language / Visual special needs []  - 0 Assessment of Community assistance (transportation, D/C planning, etc.) []  -  FAWNE, HUGHLEY ([id number]) Visit Report for 06/03/2019 Arrival Information Details Patient Name: SALIDO, Haniyah B. Date of Service: 06/03/2019 12:30 PM Medical Record Number: [id number] Patient Account Number: [id number] Date of Birth/Sex: 1935-05-26 (84 y.o. F) Treating RN: Harold Barban Primary Care Sibbie Flammia: Deborra Medina Other Clinician: Referring Kaytlin Burklow: Deborra Medina Treating Otie Headlee/Extender: Melburn Hake, HOYT Weeks in Treatment: 5 Visit Information History Since Last Visit Added or deleted any medications: No Patient Arrived: Wheel Chair Any new allergies or adverse reactions: No Arrival Time: 12:46 Had a fall or experienced change in Yes Accompanied By: son activities of daily living that may affect Transfer Assistance: Manual risk of falls: Patient Identification Verified: Yes Signs or symptoms of abuse/neglect since last visito No Secondary Verification Process Yes Hospitalized since last visit: No Completed: Implantable device outside of the clinic excluding No Patient Requires Transmission-Based No cellular tissue based products placed in the center Precautions: since last visit: Patient Has Alerts: Yes Has Dressing in Place as Prescribed: Yes Patient Alerts: Patient on Blood Has Compression in Place as Prescribed: Yes Thinner Pain Present Now: No Warfarin Type II Diabetic Notes Patient fell on the side walk outside her house today and hurt her left arm. Electronic Signature(s) Signed: 06/03/2019 3:43:02 PM By: Lorine Bears RCP, RRT, CHT Entered By: Lorine Bears on 06/03/2019 12:51:25 Aja, [name] ([id number]) -------------------------------------------------------------------------------- Clinic Level of Care Assessment Details Patient Name: Stumpp, Layli B. Date of Service: 06/03/2019 12:30 PM Medical Record Number: [id number] Patient Account Number: [id number] Date of Birth/Sex: 1935-08-19 (84 y.o. F) Treating  RN: Harold Barban Primary Care Domanic Matusek: Deborra Medina Other Clinician: Referring Giuseppe Duchemin: Deborra Medina Treating Thedford Bunton/Extender: Melburn Hake, HOYT Weeks in Treatment: 5 Clinic Level of Care Assessment Items TOOL 4 Quantity Score []  - Use when only an EandM is performed on FOLLOW-UP visit 0 ASSESSMENTS - Nursing Assessment / Reassessment X - Reassessment of Co-morbidities (includes updates in patient status) 1 10 X- 1 5 Reassessment of Adherence to Treatment Plan ASSESSMENTS - Wound and Skin Assessment / Reassessment X - Simple Wound Assessment / Reassessment - one wound 1 5 []  - 0 Complex Wound Assessment / Reassessment - multiple wounds []  - 0 Dermatologic / Skin Assessment (not related to wound area) ASSESSMENTS - Focused Assessment []  - Circumferential Edema Measurements - multi extremities 0 []  - 0 Nutritional Assessment / Counseling / Intervention []  - 0 Lower Extremity Assessment (monofilament, tuning fork, pulses) []  - 0 Peripheral Arterial Disease Assessment (using hand held doppler) ASSESSMENTS - Ostomy and/or Continence Assessment and Care []  - Incontinence Assessment and Management 0 []  - 0 Ostomy Care Assessment and Management (repouching, etc.) PROCESS - Coordination of Care X - Simple Patient / Family Education for ongoing care 1 15 []  - 0 Complex (extensive) Patient / Family Education for ongoing care []  - 0 Staff obtains Programmer, systems, Records, Test Results / Process Orders X- 1 10 Staff telephones HHA, Nursing Homes / Clarify orders / etc []  - 0 Routine Transfer to another Facility (non-emergent condition) []  - 0 Routine Hospital Admission (non-emergent condition) []  - 0 New Admissions / Biomedical engineer / Ordering NPWT, Apligraf, etc. []  - 0 Emergency Hospital Admission (emergent condition) X- 1 10 Simple Discharge Coordination Gracy, Nechuma B. ([id number]) []  - 0 Complex (extensive) Discharge Coordination PROCESS - Special Needs []  -  Pediatric / Minor Patient Management 0 []  - 0 Isolation Patient Management []  - 0 Hearing / Language / Visual special needs []  - 0 Assessment of Community assistance (transportation, D/C planning, etc.) []  -  Appeared Gradually Appeared N/A Primary Etiology: Lymphedema Trauma, Other N/A Secondary Etiology: Diabetic Wound/Ulcer of the Lymphedema N/A Lower Extremity Comorbid History: Cataracts, Chronic sinus Cataracts, Chronic sinus N/A problems/congestion, Anemia, problems/congestion, Anemia, Congestive Heart Failure, Congestive Heart Failure, Type II Diabetes, End Stage Type II Diabetes, End Stage Renal Disease, Lupus Renal Disease, Lupus Erythematosus, Neuropathy, Erythematosus, Neuropathy, Received Radiation Received Radiation Date Acquired: 03/18/2019 04/26/2019 N/A Weeks of Treatment: 5 5 N/A Wound Status: Open Open N/A Measurements L x W x D 0.1x0.1x0.1 0.4x0.8x0.1 N/A (cm) Area (cm) : 0.008 0.251 N/A Volume (cm) : 0.001 0.025 N/A % Reduction in Area: 99.80% 60.00% N/A % Reduction in Volume: 99.70% 86.70% N/A Classification: Full Thickness Without Full Thickness Without N/A Exposed Support Structures Exposed Support Structures Exudate Amount: Medium Medium N/A Exudate Type: Serous Serous N/A Exudate Color: amber amber N/A Wound Margin: Indistinct, nonvisible Flat and Intact N/A Granulation Amount: Large (67-100%) Large (67-100%) N/A Granulation Quality: Pink Pink N/A Hornberger, Janiyla B. ([id number]) Necrotic Amount: Small (1-33%) Small (1-33%) N/A Exposed Structures: Fat Layer (Subcutaneous Fat Layer (Subcutaneous  N/A Tissue) Exposed: Yes Tissue) Exposed: Yes Fascia: No Fascia: No Tendon: No Tendon: No Muscle: No Muscle: No Joint: No Joint: No Bone: No Bone: No Epithelialization: Small (1-33%) Medium (34-66%) N/A Treatment Notes Electronic Signature(s) Signed: 06/03/2019 4:31:37 PM By: Harold Barban Entered By: Harold Barban on 06/03/2019 12:58:38 Shain, [name] ([id number]) -------------------------------------------------------------------------------- [place] Details Patient Name: [name] Date of Service: 06/03/2019 12:30 PM Medical Record Number: [id number] Patient Account Number: [id number] Date of Birth/Sex: 1935/04/02 (84 y.o. F) Treating RN: Harold Barban Primary Care Emme Rosenau: Deborra Medina Other Clinician: Referring Blayn Whetsell: Deborra Medina Treating Elvert Cumpton/Extender: Melburn Hake, HOYT Weeks in Treatment: 5 Active Inactive Electronic Signature(s) Signed: 06/11/2019 1:01:38 PM By: Harold Barban Previous Signature: 06/03/2019 4:31:37 PM Version By: Harold Barban Entered By: Harold Barban on 06/11/2019 13:01:38 [place], [place] ([id number]) -------------------------------------------------------------------------------- Pain Assessment Details Patient Name: Zinda, Dechelle B. Date of Service: 06/03/2019 12:30 PM Medical Record Number: [id number] Patient Account Number: [id number] Date of Birth/Sex: 1935/02/01 (84 y.o. F) Treating RN: Harold Barban Primary Care Raymir Frommelt: Deborra Medina Other Clinician: Referring Mostafa Yuan: Deborra Medina Treating Normalee Sistare/Extender: Melburn Hake, HOYT Weeks in Treatment: 5 Active Problems Location of Pain Severity and Description of Pain Patient Has Paino No Site Locations Pain Management and Medication Current Pain Management: Electronic Signature(s) Signed: 06/03/2019 3:43:02 PM By: Lorine Bears RCP, RRT, CHT Signed: 06/03/2019 4:31:37 PM By: Harold Barban Entered By: Lorine Bears on 06/03/2019 12:51:36 Kedzierski, [name] ([id number]) -------------------------------------------------------------------------------- Patient/Caregiver Education Details Patient Name: [name] Date of Service: 06/03/2019 12:30 PM Medical Record Number: [id number] Patient Account Number: [id number] Date of Birth/Gender: Jul 08, 1935 (84 y.o. F) Treating RN: Harold Barban Primary Care Physician: Deborra Medina Other Clinician: Referring Physician: Deborra Medina Treating Physician/Extender: Melburn Hake, HOYT Weeks in Treatment: 5 Education Assessment Education Provided To: Patient Education Topics Provided Wound/Skin Impairment: Handouts: Caring for Your Ulcer Methods: Demonstration, Explain/Verbal Responses: State content correctly Electronic Signature(s) Signed: 06/03/2019 4:31:37 PM By: Harold Barban Entered By: Harold Barban on 06/03/2019 12:59:05 Hollenbach, [name] ([id number]) -------------------------------------------------------------------------------- Wound Assessment Details Patient Name: Gann, Koya B. Date of Service: 06/03/2019 12:30 PM Medical Record Number: [id number] Patient Account Number: [id number] Date of Birth/Sex: 04/30/1935 (84 y.o. F) Treating RN: Harold Barban Primary Care Deaisa Merida: Deborra Medina Other Clinician: Referring Patsy Zaragoza: Deborra Medina Treating Brennen Camper/Extender: Melburn Hake, HOYT Weeks in Treatment: 5 Wound Status Wound Number: 1 Primary Lymphedema Etiology: Wound Location: Left, Anterior Lower Leg

## 2019-06-03 NOTE — Telephone Encounter (Signed)
Patient son called stating patient fell today at 12pm and broke her shoulder. [facility]. Patient son would like to know if she can get more help, [facility] and he is requesting he would like some one there everyday. Call back [phone]

## 2019-06-03 NOTE — Telephone Encounter (Signed)
She really needs a cbc before refill to make sure she is not leukopenic.  Looks like she missed the follow up with me.   She should have been getting a cbc weekly on this.

## 2019-06-04 ENCOUNTER — Other Ambulatory Visit: Payer: Self-pay

## 2019-06-04 ENCOUNTER — Emergency Department: Payer: Medicare Other

## 2019-06-04 ENCOUNTER — Telehealth: Payer: Self-pay | Admitting: *Deleted

## 2019-06-04 ENCOUNTER — Inpatient Hospital Stay
Admission: EM | Admit: 2019-06-04 | Discharge: 2019-06-20 | DRG: 871 | Disposition: E | Payer: Medicare Other | Attending: Internal Medicine | Admitting: Internal Medicine

## 2019-06-04 DIAGNOSIS — R6521 Severe sepsis with septic shock: Secondary | ICD-10-CM | POA: Diagnosis present

## 2019-06-04 DIAGNOSIS — I480 Paroxysmal atrial fibrillation: Secondary | ICD-10-CM | POA: Diagnosis present

## 2019-06-04 DIAGNOSIS — A419 Sepsis, unspecified organism: Secondary | ICD-10-CM | POA: Diagnosis present

## 2019-06-04 DIAGNOSIS — I890 Lymphedema, not elsewhere classified: Secondary | ICD-10-CM | POA: Diagnosis present

## 2019-06-04 DIAGNOSIS — D631 Anemia in chronic kidney disease: Secondary | ICD-10-CM | POA: Diagnosis present

## 2019-06-04 DIAGNOSIS — L03115 Cellulitis of right lower limb: Secondary | ICD-10-CM | POA: Diagnosis present

## 2019-06-04 DIAGNOSIS — R0902 Hypoxemia: Secondary | ICD-10-CM | POA: Diagnosis not present

## 2019-06-04 DIAGNOSIS — L8990 Pressure ulcer of unspecified site, unspecified stage: Secondary | ICD-10-CM | POA: Insufficient documentation

## 2019-06-04 DIAGNOSIS — X58XXXA Exposure to other specified factors, initial encounter: Secondary | ICD-10-CM | POA: Diagnosis present

## 2019-06-04 DIAGNOSIS — Z91048 Other nonmedicinal substance allergy status: Secondary | ICD-10-CM

## 2019-06-04 DIAGNOSIS — N170 Acute kidney failure with tubular necrosis: Secondary | ICD-10-CM | POA: Diagnosis present

## 2019-06-04 DIAGNOSIS — N183 Chronic kidney disease, stage 3 (moderate): Secondary | ICD-10-CM | POA: Diagnosis present

## 2019-06-04 DIAGNOSIS — J9601 Acute respiratory failure with hypoxia: Secondary | ICD-10-CM | POA: Diagnosis present

## 2019-06-04 DIAGNOSIS — L03116 Cellulitis of left lower limb: Secondary | ICD-10-CM | POA: Diagnosis present

## 2019-06-04 DIAGNOSIS — R64 Cachexia: Secondary | ICD-10-CM | POA: Diagnosis present

## 2019-06-04 DIAGNOSIS — Z86718 Personal history of other venous thrombosis and embolism: Secondary | ICD-10-CM

## 2019-06-04 DIAGNOSIS — E870 Hyperosmolality and hypernatremia: Secondary | ICD-10-CM | POA: Diagnosis present

## 2019-06-04 DIAGNOSIS — I130 Hypertensive heart and chronic kidney disease with heart failure and stage 1 through stage 4 chronic kidney disease, or unspecified chronic kidney disease: Secondary | ICD-10-CM | POA: Diagnosis present

## 2019-06-04 DIAGNOSIS — N179 Acute kidney failure, unspecified: Secondary | ICD-10-CM | POA: Insufficient documentation

## 2019-06-04 DIAGNOSIS — Z79899 Other long term (current) drug therapy: Secondary | ICD-10-CM

## 2019-06-04 DIAGNOSIS — K210 Gastro-esophageal reflux disease with esophagitis: Secondary | ICD-10-CM | POA: Diagnosis present

## 2019-06-04 DIAGNOSIS — R0689 Other abnormalities of breathing: Secondary | ICD-10-CM | POA: Diagnosis not present

## 2019-06-04 DIAGNOSIS — Z882 Allergy status to sulfonamides status: Secondary | ICD-10-CM

## 2019-06-04 DIAGNOSIS — N281 Cyst of kidney, acquired: Secondary | ICD-10-CM | POA: Diagnosis not present

## 2019-06-04 DIAGNOSIS — J189 Pneumonia, unspecified organism: Secondary | ICD-10-CM | POA: Diagnosis present

## 2019-06-04 DIAGNOSIS — R52 Pain, unspecified: Secondary | ICD-10-CM | POA: Diagnosis not present

## 2019-06-04 DIAGNOSIS — S0990XA Unspecified injury of head, initial encounter: Secondary | ICD-10-CM | POA: Diagnosis not present

## 2019-06-04 DIAGNOSIS — Z86711 Personal history of pulmonary embolism: Secondary | ICD-10-CM

## 2019-06-04 DIAGNOSIS — Z6821 Body mass index (BMI) 21.0-21.9, adult: Secondary | ICD-10-CM

## 2019-06-04 DIAGNOSIS — Z7901 Long term (current) use of anticoagulants: Secondary | ICD-10-CM

## 2019-06-04 DIAGNOSIS — Z87891 Personal history of nicotine dependence: Secondary | ICD-10-CM

## 2019-06-04 DIAGNOSIS — K6389 Other specified diseases of intestine: Secondary | ICD-10-CM | POA: Diagnosis not present

## 2019-06-04 DIAGNOSIS — E785 Hyperlipidemia, unspecified: Secondary | ICD-10-CM | POA: Diagnosis present

## 2019-06-04 DIAGNOSIS — M8580 Other specified disorders of bone density and structure, unspecified site: Secondary | ICD-10-CM | POA: Diagnosis present

## 2019-06-04 DIAGNOSIS — J9602 Acute respiratory failure with hypercapnia: Secondary | ICD-10-CM | POA: Diagnosis not present

## 2019-06-04 DIAGNOSIS — Z79890 Hormone replacement therapy (postmenopausal): Secondary | ICD-10-CM

## 2019-06-04 DIAGNOSIS — Z7189 Other specified counseling: Secondary | ICD-10-CM | POA: Diagnosis not present

## 2019-06-04 DIAGNOSIS — I5032 Chronic diastolic (congestive) heart failure: Secondary | ICD-10-CM | POA: Insufficient documentation

## 2019-06-04 DIAGNOSIS — M3214 Glomerular disease in systemic lupus erythematosus: Secondary | ICD-10-CM | POA: Diagnosis present

## 2019-06-04 DIAGNOSIS — K5730 Diverticulosis of large intestine without perforation or abscess without bleeding: Secondary | ICD-10-CM | POA: Diagnosis not present

## 2019-06-04 DIAGNOSIS — S42202A Unspecified fracture of upper end of left humerus, initial encounter for closed fracture: Secondary | ICD-10-CM | POA: Diagnosis not present

## 2019-06-04 DIAGNOSIS — Z9841 Cataract extraction status, right eye: Secondary | ICD-10-CM

## 2019-06-04 DIAGNOSIS — Z01818 Encounter for other preprocedural examination: Secondary | ICD-10-CM

## 2019-06-04 DIAGNOSIS — I959 Hypotension, unspecified: Secondary | ICD-10-CM | POA: Diagnosis not present

## 2019-06-04 DIAGNOSIS — Z923 Personal history of irradiation: Secondary | ICD-10-CM

## 2019-06-04 DIAGNOSIS — J90 Pleural effusion, not elsewhere classified: Secondary | ICD-10-CM | POA: Diagnosis not present

## 2019-06-04 DIAGNOSIS — Z9842 Cataract extraction status, left eye: Secondary | ICD-10-CM

## 2019-06-04 DIAGNOSIS — I471 Supraventricular tachycardia: Secondary | ICD-10-CM | POA: Diagnosis not present

## 2019-06-04 DIAGNOSIS — R6520 Severe sepsis without septic shock: Secondary | ICD-10-CM | POA: Diagnosis not present

## 2019-06-04 DIAGNOSIS — Z7952 Long term (current) use of systemic steroids: Secondary | ICD-10-CM

## 2019-06-04 DIAGNOSIS — Z4682 Encounter for fitting and adjustment of non-vascular catheter: Secondary | ICD-10-CM | POA: Diagnosis not present

## 2019-06-04 DIAGNOSIS — Z91041 Radiographic dye allergy status: Secondary | ICD-10-CM

## 2019-06-04 DIAGNOSIS — Z9981 Dependence on supplemental oxygen: Secondary | ICD-10-CM

## 2019-06-04 DIAGNOSIS — S42212A Unspecified displaced fracture of surgical neck of left humerus, initial encounter for closed fracture: Secondary | ICD-10-CM | POA: Diagnosis present

## 2019-06-04 DIAGNOSIS — Z4659 Encounter for fitting and adjustment of other gastrointestinal appliance and device: Secondary | ICD-10-CM

## 2019-06-04 DIAGNOSIS — Z888 Allergy status to other drugs, medicaments and biological substances status: Secondary | ICD-10-CM

## 2019-06-04 DIAGNOSIS — E039 Hypothyroidism, unspecified: Secondary | ICD-10-CM | POA: Diagnosis present

## 2019-06-04 DIAGNOSIS — R570 Cardiogenic shock: Secondary | ICD-10-CM | POA: Diagnosis not present

## 2019-06-04 DIAGNOSIS — Z853 Personal history of malignant neoplasm of breast: Secondary | ICD-10-CM

## 2019-06-04 DIAGNOSIS — Z7951 Long term (current) use of inhaled steroids: Secondary | ICD-10-CM

## 2019-06-04 DIAGNOSIS — N3942 Incontinence without sensory awareness: Secondary | ICD-10-CM | POA: Diagnosis present

## 2019-06-04 DIAGNOSIS — W19XXXA Unspecified fall, initial encounter: Secondary | ICD-10-CM | POA: Diagnosis not present

## 2019-06-04 DIAGNOSIS — R54 Age-related physical debility: Secondary | ICD-10-CM | POA: Diagnosis present

## 2019-06-04 DIAGNOSIS — N200 Calculus of kidney: Secondary | ICD-10-CM | POA: Diagnosis not present

## 2019-06-04 DIAGNOSIS — R4020 Unspecified coma: Secondary | ICD-10-CM | POA: Diagnosis not present

## 2019-06-04 DIAGNOSIS — Z885 Allergy status to narcotic agent status: Secondary | ICD-10-CM

## 2019-06-04 DIAGNOSIS — Z961 Presence of intraocular lens: Secondary | ICD-10-CM | POA: Diagnosis present

## 2019-06-04 DIAGNOSIS — I472 Ventricular tachycardia: Secondary | ICD-10-CM | POA: Diagnosis not present

## 2019-06-04 DIAGNOSIS — Z66 Do not resuscitate: Secondary | ICD-10-CM | POA: Diagnosis present

## 2019-06-04 DIAGNOSIS — J9811 Atelectasis: Secondary | ICD-10-CM | POA: Diagnosis not present

## 2019-06-04 DIAGNOSIS — R601 Generalized edema: Secondary | ICD-10-CM | POA: Diagnosis not present

## 2019-06-04 DIAGNOSIS — L89321 Pressure ulcer of left buttock, stage 1: Secondary | ICD-10-CM | POA: Diagnosis present

## 2019-06-04 DIAGNOSIS — R0602 Shortness of breath: Secondary | ICD-10-CM | POA: Diagnosis not present

## 2019-06-04 DIAGNOSIS — Z20828 Contact with and (suspected) exposure to other viral communicable diseases: Secondary | ICD-10-CM | POA: Diagnosis present

## 2019-06-04 DIAGNOSIS — R000 Tachycardia, unspecified: Secondary | ICD-10-CM | POA: Diagnosis not present

## 2019-06-04 DIAGNOSIS — J440 Chronic obstructive pulmonary disease with acute lower respiratory infection: Secondary | ICD-10-CM | POA: Diagnosis present

## 2019-06-04 DIAGNOSIS — I2721 Secondary pulmonary arterial hypertension: Secondary | ICD-10-CM | POA: Diagnosis present

## 2019-06-04 DIAGNOSIS — Z515 Encounter for palliative care: Secondary | ICD-10-CM | POA: Diagnosis present

## 2019-06-04 DIAGNOSIS — T17908A Unspecified foreign body in respiratory tract, part unspecified causing other injury, initial encounter: Secondary | ICD-10-CM

## 2019-06-04 DIAGNOSIS — I509 Heart failure, unspecified: Secondary | ICD-10-CM | POA: Diagnosis not present

## 2019-06-04 DIAGNOSIS — Z803 Family history of malignant neoplasm of breast: Secondary | ICD-10-CM

## 2019-06-04 DIAGNOSIS — Z881 Allergy status to other antibiotic agents status: Secondary | ICD-10-CM

## 2019-06-04 DIAGNOSIS — R609 Edema, unspecified: Secondary | ICD-10-CM | POA: Diagnosis not present

## 2019-06-04 DIAGNOSIS — T17200A Unspecified foreign body in pharynx causing asphyxiation, initial encounter: Secondary | ICD-10-CM | POA: Diagnosis not present

## 2019-06-04 LAB — COMPREHENSIVE METABOLIC PANEL
ALT: 20 U/L (ref 0–44)
ALT: 16 U/L (ref 0–44)
AST: 38 U/L (ref 15–41)
AST: 31 U/L (ref 15–41)
Albumin: 1.5 g/dL — ABNORMAL LOW (ref 3.5–5.0)
Albumin: 1.2 g/dL — ABNORMAL LOW (ref 3.5–5.0)
Alkaline Phosphatase: 149 U/L — ABNORMAL HIGH (ref 38–126)
Alkaline Phosphatase: 138 U/L — ABNORMAL HIGH (ref 38–126)
Anion gap: 7 (ref 5–15)
Anion gap: 7 (ref 5–15)
BUN: 40 mg/dL — ABNORMAL HIGH (ref 8–23)
BUN: 42 mg/dL — ABNORMAL HIGH (ref 8–23)
CO2: 26 mmol/L (ref 22–32)
CO2: 23 mmol/L (ref 22–32)
Calcium: 8 mg/dL — ABNORMAL LOW (ref 8.9–10.3)
Calcium: 7.4 mg/dL — ABNORMAL LOW (ref 8.9–10.3)
Chloride: 116 mmol/L — ABNORMAL HIGH (ref 98–111)
Chloride: 118 mmol/L — ABNORMAL HIGH (ref 98–111)
Creatinine, Ser: 2.48 mg/dL — ABNORMAL HIGH (ref 0.44–1.00)
Creatinine, Ser: 2.38 mg/dL — ABNORMAL HIGH (ref 0.44–1.00)
GFR calc Af Amer: 20 mL/min — ABNORMAL LOW (ref >60)
GFR calc Af Amer: 21 mL/min — ABNORMAL LOW (ref >60)
GFR calc non Af Amer: 17 mL/min — ABNORMAL LOW (ref >60)
GFR calc non Af Amer: 18 mL/min — ABNORMAL LOW (ref >60)
Glucose, Bld: 98 mg/dL (ref 70–99)
Glucose, Bld: 95 mg/dL (ref 70–99)
Potassium: 4.5 mmol/L (ref 3.5–5.1)
Potassium: 4.4 mmol/L (ref 3.5–5.1)
Sodium: 149 mmol/L — ABNORMAL HIGH (ref 135–145)
Sodium: 148 mmol/L — ABNORMAL HIGH (ref 135–145)
Total Bilirubin: 0.8 mg/dL (ref 0.3–1.2)
Total Bilirubin: 0.8 mg/dL (ref 0.3–1.2)
Total Protein: 5.9 g/dL — ABNORMAL LOW (ref 6.5–8.1)
Total Protein: 4.8 g/dL — ABNORMAL LOW (ref 6.5–8.1)

## 2019-06-04 LAB — CBC WITH DIFFERENTIAL/PLATELET
Abs Immature Granulocytes: 0.02 10*3/uL (ref 0.00–0.07)
Basophils Absolute: 0.1 10*3/uL (ref 0.0–0.1)
Basophils Relative: 1 %
Eosinophils Absolute: 0 10*3/uL (ref 0.0–0.5)
Eosinophils Relative: 0 %
HCT: 34.4 % — ABNORMAL LOW (ref 36.0–46.0)
Hemoglobin: 11.3 g/dL — ABNORMAL LOW (ref 12.0–15.0)
Immature Granulocytes: 0 %
Lymphocytes Relative: 16 %
Lymphs Abs: 1.6 10*3/uL (ref 0.7–4.0)
MCH: 31.5 pg (ref 26.0–34.0)
MCHC: 32.8 g/dL (ref 30.0–36.0)
MCV: 95.8 fL (ref 80.0–100.0)
Monocytes Absolute: 0.9 10*3/uL (ref 0.1–1.0)
Monocytes Relative: 9 %
Neutro Abs: 7.1 10*3/uL (ref 1.7–7.7)
Neutrophils Relative %: 74 %
Platelets: 288 10*3/uL (ref 150–400)
RBC: 3.59 MIL/uL — ABNORMAL LOW (ref 3.87–5.11)
RDW: 18.7 % — ABNORMAL HIGH (ref 11.5–15.5)
WBC: 9.7 10*3/uL (ref 4.0–10.5)
nRBC: 0 % (ref 0.0–0.2)

## 2019-06-04 LAB — URINALYSIS, COMPLETE (UACMP) WITH MICROSCOPIC
Bilirubin Urine: NEGATIVE
Glucose, UA: NEGATIVE mg/dL
Hgb urine dipstick: NEGATIVE
Ketones, ur: NEGATIVE mg/dL
Leukocytes,Ua: NEGATIVE
Nitrite: NEGATIVE
Protein, ur: NEGATIVE mg/dL
Specific Gravity, Urine: 1.012 (ref 1.005–1.030)
pH: 5 (ref 5.0–8.0)

## 2019-06-04 LAB — CBC
HCT: 29.3 % — ABNORMAL LOW (ref 36.0–46.0)
Hemoglobin: 9.6 g/dL — ABNORMAL LOW (ref 12.0–15.0)
MCH: 31.3 pg (ref 26.0–34.0)
MCHC: 32.8 g/dL (ref 30.0–36.0)
MCV: 95.4 fL (ref 80.0–100.0)
Platelets: 193 10*3/uL (ref 150–400)
RBC: 3.07 MIL/uL — ABNORMAL LOW (ref 3.87–5.11)
RDW: 18.6 % — ABNORMAL HIGH (ref 11.5–15.5)
WBC: 13 10*3/uL — ABNORMAL HIGH (ref 4.0–10.5)
nRBC: 0 % (ref 0.0–0.2)

## 2019-06-04 LAB — PROTIME-INR
INR: 2 — ABNORMAL HIGH (ref 0.8–1.2)
Prothrombin Time: 22.2 seconds — ABNORMAL HIGH (ref 11.4–15.2)

## 2019-06-04 LAB — PROCALCITONIN: Procalcitonin: 1.21 ng/mL

## 2019-06-04 LAB — LACTIC ACID, PLASMA: Lactic Acid, Venous: 1.8 mmol/L (ref 0.5–1.9)

## 2019-06-04 LAB — SARS CORONAVIRUS 2 BY RT PCR (HOSPITAL ORDER, PERFORMED IN ~~LOC~~ HOSPITAL LAB): SARS Coronavirus 2: NEGATIVE

## 2019-06-04 MED ORDER — CYANOCOBALAMIN 1000 MCG/ML IJ SOLN: 1000.0000 ug | INTRAMUSCULAR | Status: DC

## 2019-06-04 MED ORDER — HYDROXYCHLOROQUINE SULFATE 200 MG PO TABS
200.0000 mg | ORAL_TABLET | Freq: Every day | ORAL | Status: DC
  Administered 2019-06-07: 200 mg via ORAL
  Filled 2019-06-04 (×3): qty 1

## 2019-06-04 MED ORDER — MYCOPHENOLATE MOFETIL 250 MG PO CAPS
1000.0000 mg | ORAL_CAPSULE | Freq: Two times a day (BID) | ORAL | Status: DC
  Administered 2019-06-05 – 2019-06-07 (×3): 1000 mg via ORAL
  Filled 2019-06-04 (×5): qty 4

## 2019-06-04 MED ORDER — LEVOFLOXACIN 500 MG PO TABS: 500.0000 mg | ORAL_TABLET | ORAL | Status: DC

## 2019-06-04 MED ORDER — VANCOMYCIN HCL IN DEXTROSE 1-5 GM/200ML-% IV SOLN
1000.0000 mg | Freq: Once | INTRAVENOUS | Status: AC
  Administered 2019-06-04: 16:00:00 1000 mg via INTRAVENOUS
  Filled 2019-06-04: qty 200

## 2019-06-04 MED ORDER — VALGANCICLOVIR HCL 450 MG PO TABS
450.0000 mg | ORAL_TABLET | ORAL | Status: DC
  Administered 2019-06-07: 10:00:00 450 mg via ORAL
  Filled 2019-06-04 (×2): qty 1

## 2019-06-04 MED ORDER — SODIUM CHLORIDE 0.9 % IV BOLUS (SEPSIS): 500.0000 mL | Freq: Once | INTRAVENOUS | Status: DC

## 2019-06-04 MED ORDER — LEVOTHYROXINE SODIUM 112 MCG PO TABS
112.0000 ug | ORAL_TABLET | Freq: Every day | ORAL | Status: DC
  Filled 2019-06-04: qty 1

## 2019-06-04 MED ORDER — PROCHLORPERAZINE MALEATE 5 MG PO TABS
5.0000 mg | ORAL_TABLET | Freq: Four times a day (QID) | ORAL | Status: DC | PRN
  Filled 2019-06-04: qty 1

## 2019-06-04 MED ORDER — OXYCODONE HCL 5 MG PO TABS
5.0000 mg | ORAL_TABLET | Freq: Once | ORAL | Status: AC
  Administered 2019-06-04: 5 mg via ORAL
  Filled 2019-06-04: qty 1

## 2019-06-04 MED ORDER — METRONIDAZOLE IN NACL 5-0.79 MG/ML-% IV SOLN
500.0000 mg | Freq: Once | INTRAVENOUS | Status: AC
  Administered 2019-06-04: 14:00:00 500 mg via INTRAVENOUS
  Filled 2019-06-04: qty 100

## 2019-06-04 MED ORDER — CALCIUM CARBONATE-VITAMIN D 500-200 MG-UNIT PO TABS
1.0000 | ORAL_TABLET | Freq: Every day | ORAL | Status: DC
  Administered 2019-06-07: 1 via ORAL
  Filled 2019-06-04 (×2): qty 1

## 2019-06-04 MED ORDER — VITAMIN C 500 MG PO TABS
1000.0000 mg | ORAL_TABLET | Freq: Every day | ORAL | Status: DC
  Administered 2019-06-07: 10:00:00 1000 mg via ORAL
  Filled 2019-06-04 (×2): qty 2

## 2019-06-04 MED ORDER — SODIUM CHLORIDE 0.9 % IV SOLN
2.0000 g | Freq: Once | INTRAVENOUS | Status: AC
  Administered 2019-06-04: 2 g via INTRAVENOUS
  Filled 2019-06-04: qty 2

## 2019-06-04 MED ORDER — WARFARIN SODIUM 2 MG PO TABS: 2.0000 mg | ORAL_TABLET | ORAL | Status: DC

## 2019-06-04 MED ORDER — ONDANSETRON 4 MG PO TBDP
4.0000 mg | ORAL_TABLET | Freq: Three times a day (TID) | ORAL | Status: DC | PRN
  Filled 2019-06-04: qty 1

## 2019-06-04 MED ORDER — PANTOPRAZOLE SODIUM 40 MG PO TBEC
40.0000 mg | DELAYED_RELEASE_TABLET | Freq: Two times a day (BID) | ORAL | Status: DC
  Administered 2019-06-05: 22:00:00 40 mg via ORAL
  Filled 2019-06-04 (×2): qty 1

## 2019-06-04 MED ORDER — LEVOFLOXACIN IN D5W 750 MG/150ML IV SOLN: 750.0000 mg | Freq: Once | INTRAVENOUS | Status: DC

## 2019-06-04 MED ORDER — SACCHAROMYCES BOULARDII 250 MG PO CAPS
250.0000 mg | ORAL_CAPSULE | Freq: Every day | ORAL | Status: DC
  Administered 2019-06-07: 10:00:00 250 mg via ORAL
  Filled 2019-06-04 (×3): qty 1

## 2019-06-04 MED ORDER — SODIUM CHLORIDE 0.9 % IV BOLUS
500.0000 mL | Freq: Once | INTRAVENOUS | Status: AC
  Administered 2019-06-04: 13:00:00 500 mL via INTRAVENOUS

## 2019-06-04 MED ORDER — TADALAFIL (PAH) 20 MG PO TABS
40.0000 mg | ORAL_TABLET | Freq: Every day | ORAL | Status: DC
  Filled 2019-06-04: qty 2

## 2019-06-04 MED ORDER — SODIUM CHLORIDE 0.9 % IV BOLUS (SEPSIS): 1000.0000 mL | Freq: Once | INTRAVENOUS | Status: DC

## 2019-06-04 MED ORDER — CHLORHEXIDINE GLUCONATE CLOTH 2 % EX PADS
6.0000 | MEDICATED_PAD | Freq: Every day | CUTANEOUS | Status: DC
  Administered 2019-06-05 – 2019-06-07 (×3): 6 via TOPICAL

## 2019-06-04 MED ORDER — LEVOFLOXACIN IN D5W 750 MG/150ML IV SOLN
750.0000 mg | Freq: Once | INTRAVENOUS | Status: AC
  Administered 2019-06-05: 01:00:00 750 mg via INTRAVENOUS
  Filled 2019-06-04: qty 150

## 2019-06-04 MED ORDER — ACETAMINOPHEN 500 MG PO TABS
1000.0000 mg | ORAL_TABLET | Freq: Once | ORAL | Status: AC
  Administered 2019-06-04: 1000 mg via ORAL
  Filled 2019-06-04: qty 2

## 2019-06-04 MED ORDER — LETROZOLE 2.5 MG PO TABS
2.5000 mg | ORAL_TABLET | Freq: Every day | ORAL | Status: DC
  Administered 2019-06-07: 2.5 mg via ORAL
  Filled 2019-06-04 (×3): qty 1

## 2019-06-04 MED ORDER — METOPROLOL SUCCINATE ER 25 MG PO TB24
12.5000 mg | ORAL_TABLET | Freq: Every day | ORAL | Status: DC
  Filled 2019-06-04: qty 0.5

## 2019-06-04 MED ORDER — PREDNISONE 10 MG PO TABS
5.0000 mg | ORAL_TABLET | Freq: Every day | ORAL | Status: DC
  Administered 2019-06-05: 5 mg via ORAL
  Filled 2019-06-04: qty 1

## 2019-06-04 NOTE — ED Notes (Signed)
Pt bed linen changed by this Probation officer and RN Caryl Pina due to seepage from pt legs, pt gown changed and pillow placed under pt legs for comfort. Pt provided with warm blankets and ginger ale. Pt has not further complaints at this time

## 2019-06-04 NOTE — ED Notes (Signed)
Pt has fentanyl patch to right upper arm below shoulder

## 2019-06-04 NOTE — Consult Note (Addendum)
PHARMACY -  BRIEF ANTIBIOTIC NOTE   Pharmacy has received consult(s) for Vancomycin and Aztreonam from an ED provider.  The patient's profile has been reviewed for ht/wt/allergies/indication/available labs.    Pertinent Documented  Allergies:  Amoxicillin - stomach cramping (intolerance)  Keflex- rash Cefuroxime- itching  Allergies of keflex and cefuroxime were discussed with patient. She reports that it causes SOB yet denies rash or itching. She states that she last had one of these antibiotics June 2020. Per her chart, I do see a recent Augmentin medication prescribed in May and June of 2020. When discussing allergy history with patient, she seemed somewhat distracted. May need to consider discussing allergy history when admitted with patient or family.   One time order(s) placed for Vancomycin 1g x1 dose and Aztreonam 2g x1 dose (medication already given).   Further antibiotics/pharmacy consults should be ordered by admitting physician if indicated.                       Thank you, Rowland Lathe 06/18/2019  1:17 PM

## 2019-06-04 NOTE — H&P (Signed)
[facility] NAME: Christian Treadway    MR#:  [id number]  DATE OF BIRTH:  1935/03/20  DATE OF ADMISSION:  06/06/2019  PRIMARY CARE PHYSICIAN: Crecencio Mc, MD   REQUESTING/REFERRING PHYSICIAN: Vanessa [place], MD  CHIEF COMPLAINT:  No chief complaint on file.  HISTORY OF PRESENT ILLNESS:  Chiyeko Ferre  is a 84 y.o. female with a known history of A. fib on warfarin, CHF, severe lymphedema who recently had a left shoulder injury and placed in immobilizer at local urgent care who is being admitted for suspected sepsis.   Patient had to be febrile to 101, tachycardic to 140 and hypotensive in ED.    Patient says she baseline has redness of her lower extremities. Patient is satting 88% on room air.  Denies feeling SOB or cough. Just says she overall does not feel well, started today, nothing makes it better or worse. Ambulates with walker at baseline which has been hard to do with the left arm in immobilizer.  PAST MEDICAL HISTORY:   Past Medical History:  Diagnosis Date   Acute glomerulonephritis with other specified pathological lesion in kidney in disease classified elsewhere(580.81)    Alveolar aeration decreased    Anemia    Arthritis    Atrial fibrillation ([place])    Breast cancer ([place]) 09/03/202015   positive, radiation   Cancer ([place])    Breast   CHF (congestive heart failure) (HCC)    Diaphragmatic hernia    DVT (deep venous thrombosis) ([place])    Dysrhythmia    Environmental allergies    Erosive esophagitis 03/28/2015   esophageal biopsy July 2017 c/w CMV cytopathic changes, negative for H Pylori and dysplasia      Esophageal reflux    Esophagitis, CMV ([place]) 05/26/2015   Diagnosed at [facility] with manometry,  Tube feeds started for enteral nutrtion 05/12/15    Feeding problem    FEEDING TUBE   Hiatal hernia    Hyperlipidemia    Hypertension    Lower extremity edema    Lupus (systemic lupus erythematosus)  (HCC)    Neuropathy    Osteopenia    Oxygen deficiency    USES HS   Peripheral neuropathy, hereditary/idiopathic    Personal history of radiation therapy    Pneumonia    Primary pulmonary hypertension ([place])    Renal insufficiency    Sciatica    Shingles    Shingles (herpes zoster) polyneuropathy March 2013   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   Shortness of breath dyspnea    Unspecified hypothyroidism    Unspecified menopausal and postmenopausal disorder    Urinary incontinence without sensory awareness    Vitamin B deficiency     PAST SURGICAL HISTORY:   Past Surgical History:  Procedure Laterality Date   BREAST BIOPSY Left 2015   invasive mammary carcinoma   BREAST LUMPECTOMY Left 2015   invasive mammary carcinoma clear margins but close   BREAST SURGERY     CATARACT EXTRACTION W/PHACO Right 04/05/2016   Procedure: CATARACT EXTRACTION PHACO AND INTRAOCULAR LENS PLACEMENT ([place]);  Surgeon: Birder Robson, MD;  Location: [facility] ORS;  Service: Ophthalmology;  Laterality: Right;  [place] 01:20 AP% 23.9 CDE 19.29 fluid pack lot # [id number]   CATARACT EXTRACTION W/PHACO Left 04/26/2016   Procedure: CATARACT EXTRACTION PHACO AND INTRAOCULAR LENS PLACEMENT ([facility]);  Surgeon: Birder Robson, MD;  Location: [facility] OR[facility];  Service: Ophthalmology;  Laterality: Left;   [place] 00:59 AP% 23.2 CDE  [facility] NAME: Christian Treadway    MR#:  [id number]  DATE OF BIRTH:  1935/03/20  DATE OF ADMISSION:  06/06/2019  PRIMARY CARE PHYSICIAN: Crecencio Mc, MD   REQUESTING/REFERRING PHYSICIAN: Vanessa [place], MD  CHIEF COMPLAINT:  No chief complaint on file.  HISTORY OF PRESENT ILLNESS:  Chiyeko Ferre  is a 84 y.o. female with a known history of A. fib on warfarin, CHF, severe lymphedema who recently had a left shoulder injury and placed in immobilizer at local urgent care who is being admitted for suspected sepsis.   Patient had to be febrile to 101, tachycardic to 140 and hypotensive in ED.    Patient says she baseline has redness of her lower extremities. Patient is satting 88% on room air.  Denies feeling SOB or cough. Just says she overall does not feel well, started today, nothing makes it better or worse. Ambulates with walker at baseline which has been hard to do with the left arm in immobilizer.  PAST MEDICAL HISTORY:   Past Medical History:  Diagnosis Date   Acute glomerulonephritis with other specified pathological lesion in kidney in disease classified elsewhere(580.81)    Alveolar aeration decreased    Anemia    Arthritis    Atrial fibrillation ([place])    Breast cancer ([place]) 09/03/202015   positive, radiation   Cancer ([place])    Breast   CHF (congestive heart failure) (HCC)    Diaphragmatic hernia    DVT (deep venous thrombosis) ([place])    Dysrhythmia    Environmental allergies    Erosive esophagitis 03/28/2015   esophageal biopsy July 2017 c/w CMV cytopathic changes, negative for H Pylori and dysplasia      Esophageal reflux    Esophagitis, CMV ([place]) 05/26/2015   Diagnosed at [facility] with manometry,  Tube feeds started for enteral nutrtion 05/12/15    Feeding problem    FEEDING TUBE   Hiatal hernia    Hyperlipidemia    Hypertension    Lower extremity edema    Lupus (systemic lupus erythematosus)  (HCC)    Neuropathy    Osteopenia    Oxygen deficiency    USES HS   Peripheral neuropathy, hereditary/idiopathic    Personal history of radiation therapy    Pneumonia    Primary pulmonary hypertension ([place])    Renal insufficiency    Sciatica    Shingles    Shingles (herpes zoster) polyneuropathy March 2013   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   Shortness of breath dyspnea    Unspecified hypothyroidism    Unspecified menopausal and postmenopausal disorder    Urinary incontinence without sensory awareness    Vitamin B deficiency     PAST SURGICAL HISTORY:   Past Surgical History:  Procedure Laterality Date   BREAST BIOPSY Left 2015   invasive mammary carcinoma   BREAST LUMPECTOMY Left 2015   invasive mammary carcinoma clear margins but close   BREAST SURGERY     CATARACT EXTRACTION W/PHACO Right 04/05/2016   Procedure: CATARACT EXTRACTION PHACO AND INTRAOCULAR LENS PLACEMENT ([place]);  Surgeon: Birder Robson, MD;  Location: [facility];  Service: Ophthalmology;  Laterality: Right;  [place] 01:20 AP% 23.9 CDE 19.29 fluid pack lot # [id number]   CATARACT EXTRACTION W/PHACO Left 04/26/2016   Procedure: CATARACT EXTRACTION PHACO AND INTRAOCULAR LENS PLACEMENT ([facility]);  Surgeon: Birder Robson, MD;  Location: [facility];  Service: Ophthalmology;  Laterality: Left;   [place] 00:59 AP% 23.2 CDE  [facility] NAME: Christian Treadway    MR#:  [id number]  DATE OF BIRTH:  1935/03/20  DATE OF ADMISSION:  06/06/2019  PRIMARY CARE PHYSICIAN: Crecencio Mc, MD   REQUESTING/REFERRING PHYSICIAN: Vanessa [place], MD  CHIEF COMPLAINT:  No chief complaint on file.  HISTORY OF PRESENT ILLNESS:  Chiyeko Ferre  is a 84 y.o. female with a known history of A. fib on warfarin, CHF, severe lymphedema who recently had a left shoulder injury and placed in immobilizer at local urgent care who is being admitted for suspected sepsis.   Patient had to be febrile to 101, tachycardic to 140 and hypotensive in ED.    Patient says she baseline has redness of her lower extremities. Patient is satting 88% on room air.  Denies feeling SOB or cough. Just says she overall does not feel well, started today, nothing makes it better or worse. Ambulates with walker at baseline which has been hard to do with the left arm in immobilizer.  PAST MEDICAL HISTORY:   Past Medical History:  Diagnosis Date   Acute glomerulonephritis with other specified pathological lesion in kidney in disease classified elsewhere(580.81)    Alveolar aeration decreased    Anemia    Arthritis    Atrial fibrillation ([place])    Breast cancer ([place]) 09/13/202015   positive, radiation   Cancer ([place])    Breast   CHF (congestive heart failure) (HCC)    Diaphragmatic hernia    DVT (deep venous thrombosis) ([place])    Dysrhythmia    Environmental allergies    Erosive esophagitis 03/28/2015   esophageal biopsy July 2017 c/w CMV cytopathic changes, negative for H Pylori and dysplasia      Esophageal reflux    Esophagitis, CMV ([place]) 05/26/2015   Diagnosed at [facility] with manometry,  Tube feeds started for enteral nutrtion 05/12/15    Feeding problem    FEEDING TUBE   Hiatal hernia    Hyperlipidemia    Hypertension    Lower extremity edema    Lupus (systemic lupus erythematosus)  (HCC)    Neuropathy    Osteopenia    Oxygen deficiency    USES HS   Peripheral neuropathy, hereditary/idiopathic    Personal history of radiation therapy    Pneumonia    Primary pulmonary hypertension ([place])    Renal insufficiency    Sciatica    Shingles    Shingles (herpes zoster) polyneuropathy March 2013   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   Shortness of breath dyspnea    Unspecified hypothyroidism    Unspecified menopausal and postmenopausal disorder    Urinary incontinence without sensory awareness    Vitamin B deficiency     PAST SURGICAL HISTORY:   Past Surgical History:  Procedure Laterality Date   BREAST BIOPSY Left 2015   invasive mammary carcinoma   BREAST LUMPECTOMY Left 2015   invasive mammary carcinoma clear margins but close   BREAST SURGERY     CATARACT EXTRACTION W/PHACO Right 04/05/2016   Procedure: CATARACT EXTRACTION PHACO AND INTRAOCULAR LENS PLACEMENT ([place]);  Surgeon: Birder Robson, MD;  Location: [facility];  Service: Ophthalmology;  Laterality: Right;  [place] 01:20 AP% 23.9 CDE 19.29 fluid pack lot # [id number]   CATARACT EXTRACTION W/PHACO Left 04/26/2016   Procedure: CATARACT EXTRACTION PHACO AND INTRAOCULAR LENS PLACEMENT ([facility]);  Surgeon: Birder Robson, MD;  Location: [facility];  Service: Ophthalmology;  Laterality: Left;   [place] 00:59 AP% 23.2 CDE  [facility] NAME: Christian Treadway    MR#:  [id number]  DATE OF BIRTH:  1935/03/20  DATE OF ADMISSION:  06/06/2019  PRIMARY CARE PHYSICIAN: Crecencio Mc, MD   REQUESTING/REFERRING PHYSICIAN: Vanessa [place], MD  CHIEF COMPLAINT:  No chief complaint on file.  HISTORY OF PRESENT ILLNESS:  Chiyeko Ferre  is a 84 y.o. female with a known history of A. fib on warfarin, CHF, severe lymphedema who recently had a left shoulder injury and placed in immobilizer at local urgent care who is being admitted for suspected sepsis.   Patient had to be febrile to 101, tachycardic to 140 and hypotensive in ED.    Patient says she baseline has redness of her lower extremities. Patient is satting 88% on room air.  Denies feeling SOB or cough. Just says she overall does not feel well, started today, nothing makes it better or worse. Ambulates with walker at baseline which has been hard to do with the left arm in immobilizer.  PAST MEDICAL HISTORY:   Past Medical History:  Diagnosis Date   Acute glomerulonephritis with other specified pathological lesion in kidney in disease classified elsewhere(580.81)    Alveolar aeration decreased    Anemia    Arthritis    Atrial fibrillation ([place])    Breast cancer ([place]) 09/03/202015   positive, radiation   Cancer ([place])    Breast   CHF (congestive heart failure) (HCC)    Diaphragmatic hernia    DVT (deep venous thrombosis) ([place])    Dysrhythmia    Environmental allergies    Erosive esophagitis 03/28/2015   esophageal biopsy July 2017 c/w CMV cytopathic changes, negative for H Pylori and dysplasia      Esophageal reflux    Esophagitis, CMV ([place]) 05/26/2015   Diagnosed at [facility] with manometry,  Tube feeds started for enteral nutrtion 05/12/15    Feeding problem    FEEDING TUBE   Hiatal hernia    Hyperlipidemia    Hypertension    Lower extremity edema    Lupus (systemic lupus erythematosus)  (HCC)    Neuropathy    Osteopenia    Oxygen deficiency    USES HS   Peripheral neuropathy, hereditary/idiopathic    Personal history of radiation therapy    Pneumonia    Primary pulmonary hypertension ([place])    Renal insufficiency    Sciatica    Shingles    Shingles (herpes zoster) polyneuropathy March 2013   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   Shortness of breath dyspnea    Unspecified hypothyroidism    Unspecified menopausal and postmenopausal disorder    Urinary incontinence without sensory awareness    Vitamin B deficiency     PAST SURGICAL HISTORY:   Past Surgical History:  Procedure Laterality Date   BREAST BIOPSY Left 2015   invasive mammary carcinoma   BREAST LUMPECTOMY Left 2015   invasive mammary carcinoma clear margins but close   BREAST SURGERY     CATARACT EXTRACTION W/PHACO Right 04/05/2016   Procedure: CATARACT EXTRACTION PHACO AND INTRAOCULAR LENS PLACEMENT ([place]);  Surgeon: Birder Robson, MD;  Location: [facility];  Service: Ophthalmology;  Laterality: Right;  [place] 01:20 AP% 23.9 CDE 19.29 fluid pack lot # [id number]   CATARACT EXTRACTION W/PHACO Left 04/26/2016   Procedure: CATARACT EXTRACTION PHACO AND INTRAOCULAR LENS PLACEMENT ([facility]);  Surgeon: Birder Robson, MD;  Location: [facility];  Service: Ophthalmology;  Laterality: Left;   [place] 00:59 AP% 23.2 CDE  [facility] [place] NAME: Christian Treadway    MR#:  [id number]  DATE OF BIRTH:  1935/03/20  DATE OF ADMISSION:  06/06/2019  PRIMARY CARE PHYSICIAN: Crecencio Mc, MD   REQUESTING/REFERRING PHYSICIAN: Vanessa [place], MD  CHIEF COMPLAINT:  No chief complaint on file.  HISTORY OF PRESENT ILLNESS:  Chiyeko Ferre  is a 84 y.o. female with a known history of A. fib on warfarin, CHF, severe lymphedema who recently had a left shoulder injury and placed in immobilizer at local urgent care who is being admitted for suspected sepsis.   Patient had to be febrile to 101, tachycardic to 140 and hypotensive in ED.    Patient says she baseline has redness of her lower extremities. Patient is satting 88% on room air.  Denies feeling SOB or cough. Just says she overall does not feel well, started today, nothing makes it better or worse. Ambulates with walker at baseline which has been hard to do with the left arm in immobilizer.  PAST MEDICAL HISTORY:   Past Medical History:  Diagnosis Date   Acute glomerulonephritis with other specified pathological lesion in kidney in disease classified elsewhere(580.81)    Alveolar aeration decreased    Anemia    Arthritis    Atrial fibrillation ([place])    Breast cancer ([place]) 09/03/202015   positive, radiation   Cancer ([place])    Breast   CHF (congestive heart failure) (HCC)    Diaphragmatic hernia    DVT (deep venous thrombosis) ([place])    Dysrhythmia    Environmental allergies    Erosive esophagitis 03/28/2015   esophageal biopsy July 2017 c/w CMV cytopathic changes, negative for H Pylori and dysplasia      Esophageal reflux    Esophagitis, CMV ([place]) 05/26/2015   Diagnosed at [facility] with manometry,  Tube feeds started for enteral nutrtion 05/12/15    Feeding problem    FEEDING TUBE   Hiatal hernia    Hyperlipidemia    Hypertension    Lower extremity edema    Lupus (systemic lupus erythematosus)  (HCC)    Neuropathy    Osteopenia    Oxygen deficiency    USES HS   Peripheral neuropathy, hereditary/idiopathic    Personal history of radiation therapy    Pneumonia    Primary pulmonary hypertension ([place])    Renal insufficiency    Sciatica    Shingles    Shingles (herpes zoster) polyneuropathy March 2013   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   Shortness of breath dyspnea    Unspecified hypothyroidism    Unspecified menopausal and postmenopausal disorder    Urinary incontinence without sensory awareness    Vitamin B deficiency     PAST SURGICAL HISTORY:   Past Surgical History:  Procedure Laterality Date   BREAST BIOPSY Left 2015   invasive mammary carcinoma   BREAST LUMPECTOMY Left 2015   invasive mammary carcinoma clear margins but close   BREAST SURGERY     CATARACT EXTRACTION W/PHACO Right 04/05/2016   Procedure: CATARACT EXTRACTION PHACO AND INTRAOCULAR LENS PLACEMENT ([place]);  Surgeon: Birder Robson, MD;  Location: [facility];  Service: Ophthalmology;  Laterality: Right;  [place] 01:20 AP% 23.9 CDE 19.29 fluid pack lot # [id number]   CATARACT EXTRACTION W/PHACO Left 04/26/2016   Procedure: CATARACT EXTRACTION PHACO AND INTRAOCULAR LENS PLACEMENT ([facility]);  Surgeon: Birder Robson, MD;  Location: [facility];  Service: Ophthalmology;  Laterality: Left;   [place] 00:59 AP% 23.2 CDE  [facility] [place] NAME: Christian Treadway    MR#:  [id number]  DATE OF BIRTH:  1935/03/20  DATE OF ADMISSION:  06/06/2019  PRIMARY CARE PHYSICIAN: Crecencio Mc, MD   REQUESTING/REFERRING PHYSICIAN: Vanessa [place], MD  CHIEF COMPLAINT:  No chief complaint on file.  HISTORY OF PRESENT ILLNESS:  Chiyeko Ferre  is a 84 y.o. female with a known history of A. fib on warfarin, CHF, severe lymphedema who recently had a left shoulder injury and placed in immobilizer at local urgent care who is being admitted for suspected sepsis.   Patient had to be febrile to 101, tachycardic to 140 and hypotensive in ED.    Patient says she baseline has redness of her lower extremities. Patient is satting 88% on room air.  Denies feeling SOB or cough. Just says she overall does not feel well, started today, nothing makes it better or worse. Ambulates with walker at baseline which has been hard to do with the left arm in immobilizer.  PAST MEDICAL HISTORY:   Past Medical History:  Diagnosis Date   Acute glomerulonephritis with other specified pathological lesion in kidney in disease classified elsewhere(580.81)    Alveolar aeration decreased    Anemia    Arthritis    Atrial fibrillation ([place])    Breast cancer ([place]) 09/13/202015   positive, radiation   Cancer ([place])    Breast   CHF (congestive heart failure) (HCC)    Diaphragmatic hernia    DVT (deep venous thrombosis) ([place])    Dysrhythmia    Environmental allergies    Erosive esophagitis 03/28/2015   esophageal biopsy July 2017 c/w CMV cytopathic changes, negative for H Pylori and dysplasia      Esophageal reflux    Esophagitis, CMV ([place]) 05/26/2015   Diagnosed at [facility] with manometry,  Tube feeds started for enteral nutrtion 05/12/15    Feeding problem    FEEDING TUBE   Hiatal hernia    Hyperlipidemia    Hypertension    Lower extremity edema    Lupus (systemic lupus erythematosus)  (HCC)    Neuropathy    Osteopenia    Oxygen deficiency    USES HS   Peripheral neuropathy, hereditary/idiopathic    Personal history of radiation therapy    Pneumonia    Primary pulmonary hypertension ([place])    Renal insufficiency    Sciatica    Shingles    Shingles (herpes zoster) polyneuropathy March 2013   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   Shortness of breath dyspnea    Unspecified hypothyroidism    Unspecified menopausal and postmenopausal disorder    Urinary incontinence without sensory awareness    Vitamin B deficiency     PAST SURGICAL HISTORY:   Past Surgical History:  Procedure Laterality Date   BREAST BIOPSY Left 2015   invasive mammary carcinoma   BREAST LUMPECTOMY Left 2015   invasive mammary carcinoma clear margins but close   BREAST SURGERY     CATARACT EXTRACTION W/PHACO Right 04/05/2016   Procedure: CATARACT EXTRACTION PHACO AND INTRAOCULAR LENS PLACEMENT ([place]);  Surgeon: Birder Robson, MD;  Location: [facility];  Service: Ophthalmology;  Laterality: Right;  [place] 01:20 AP% 23.9 CDE 19.29 fluid pack lot # [id number]   CATARACT EXTRACTION W/PHACO Left 04/26/2016   Procedure: CATARACT EXTRACTION PHACO AND INTRAOCULAR LENS PLACEMENT ([facility]);  Surgeon: Birder Robson, MD;  Location: [facility];  Service: Ophthalmology;  Laterality: Left;   [place] 00:59 AP% 23.2 CDE

## 2019-06-04 NOTE — Progress Notes (Signed)
CODE SEPSIS - PHARMACY COMMUNICATION  **Broad Spectrum Antibiotics should be administered within 1 hour of Sepsis diagnosis**  Time Code Sepsis Called/Page Received: 1:09 pm   Antibiotics Ordered: Vancomycin and Aztreonam   Time of 1st antibiotic administration: Aztreonam @1312   Additional action taken by pharmacy: N/A  If necessary, Name of Provider/Nurse Contacted: N/A    Rowland Lathe ,PharmD Clinical Pharmacist  06/14/2019  1:33 PM

## 2019-06-04 NOTE — ED Notes (Signed)
Pt has bilateral lower extremity edema present, pt bed linens saturated with clear serous fluid, linens changed. Heels elevated off the mattress with pillow.  Pt c/o pain to lower extremities.  Pt turned to right side to clean up sheets.

## 2019-06-04 NOTE — ED Provider Notes (Addendum)
[facility] Emergency Department Provider Note  ____________________________________________   First MD Initiated Contact with Patient 06/03/2019 1222     (approximate)  I have reviewed the triage vital signs and the nursing notes.   HISTORY  Chief Complaint Fever    HPI PATTRICIA [name] is a 84 y.o. female with A. fib on warfarin, CHF, severe lymphedema who recently had a left shoulder injury and placed in immobilizer who now presents with concern for concern for infection.   Patient had to be febrile to 101, tachycardic to 140 and hypotensive.    Patient says she baseline has redness of her lower extremities.  She denies any new chest pain.  Patient is satting 88% on room air.  Denies feeling SOB or cough. Just says she overall does not feel well, started today, nothing makes it better or worse. She denies any known coronavirus contacts. Ambulates with walker at baseline which has been hard to do with the left arm in immobilizer.         Past Medical History:  Diagnosis Date  . Acute glomerulonephritis with other specified pathological lesion in kidney in disease classified elsewhere(580.81)   . Alveolar aeration decreased   . Anemia   . Arthritis   . Atrial fibrillation ([place])   . Breast cancer ([place]) 09/13/202015   positive, radiation  . Cancer (HCC)    Breast  . CHF (congestive heart failure) ([place])   . Diaphragmatic hernia   . DVT (deep venous thrombosis) ([place])   . Dysrhythmia   . Environmental allergies   . Erosive esophagitis 03/28/2015   esophageal biopsy July 2017 c/w CMV cytopathic changes, negative for H Pylori and dysplasia     . Esophageal reflux   . Esophagitis, CMV ([place]) 05/26/2015   Diagnosed at [facility] with manometry,  Tube feeds started for enteral nutrtion 05/12/15   . Feeding problem    FEEDING TUBE  . Hiatal hernia   . Hyperlipidemia   . Hypertension   . Lower extremity edema   . Lupus (systemic lupus erythematosus) ([place])   .  Neuropathy   . Osteopenia   . Oxygen deficiency    USES HS  . Peripheral neuropathy, hereditary/idiopathic   . Personal history of radiation therapy   . Pneumonia   . Primary pulmonary hypertension ([place])   . Renal insufficiency   . Sciatica   . Shingles   . Shingles (herpes zoster) polyneuropathy March 2013   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  . Shortness of breath dyspnea   . Unspecified hypothyroidism   . Unspecified menopausal and postmenopausal disorder   . Urinary incontinence without sensory awareness   . Vitamin B deficiency     Patient Active Problem List   Diagnosis Date Noted  . Medication monitoring encounter 05/06/2019  . Lymphedema of both lower extremities 04/24/2019  . Problems with swallowing and mastication   . Nausea and vomiting   . Dysphasia   . Food impaction of esophagus   . Stricture and stenosis of esophagus   . Diverticulitis 03/10/2019  . Abdominal pain, chronic, left lower quadrant 12/13/2018  . Osteoporosis 08/11/2018  . Nocturnal hypoxemia due to pulmonary hypertension ([place]) 07/29/2018  . Tubular adenoma of colon 06/08/2018  . Chronic right hip pain 05/09/2018  . Hospital discharge follow-up 12/16/2017  . Ventral hernia without obstruction or gangrene 11/09/2017  . Anticoagulation goal of INR 2 to 3 09/14/2016  . Acquired asplenia 08/21/2016  . Sjogrens syndrome ([place]) 08/21/2016  . Long  appreciate intervals.  ____________________________________________  [place], personally viewed and evaluated these images (plain radiographs) as part of my medical decision making, as well as reviewing the  written report by the radiologist.  ED MD interpretation: Possible developing opacities on the left side.  Official radiology report(s): Dg Chest Portable 1 View  Result Date: 06/17/2019 CLINICAL DATA:  Shortness of breath EXAM: PORTABLE CHEST 1 VIEW COMPARISON:  03/10/2019 FINDINGS: The heart size and mediastinal contours are within normal limits. Calcific aortic knob. Prominent retrocardiac opacity compatible with known large hiatal hernia. Increasing left perihilar and left basilar markings. Lungs hyperexpanded. No large pleural fluid collection. No pneumothorax. IMPRESSION: Increasing left perihilar and left basilar airspace opacity suspicious for developing infection in the appropriate clinical setting. Electronically Signed   By: Davina Poke M.D.   On: 06/14/2019 13:02    ____________________________________________   PROCEDURES  Procedure(s) performed (including Critical Care):  .Critical Care Performed by: Vanessa [place], MD Authorized by: Vanessa [place], MD   Critical care provider statement:    Critical care time (minutes):  45   Critical care was necessary to treat or prevent imminent or life-threatening deterioration of the following conditions:  Sepsis and respiratory failure   Critical care was time spent personally by me on the following activities:  Discussions with consultants, evaluation of patient's response to treatment, examination of patient, ordering and performing treatments and interventions, ordering and review of laboratory studies, ordering and review of radiographic studies, pulse oximetry, re-evaluation of patient's condition, obtaining history from patient or surrogate and review of old charts Ultrasound ED Peripheral IV (Provider)  Date/Time: 06/06/2019 1:06 PM Performed by: Vanessa [place], MD Authorized by: Vanessa [place], MD   Procedure details:    Indications: hydration and hypotension     Location:  Right AC   Bedside Ultrasound Guided: Yes      Images: not archived     Patient tolerated procedure without complications: Yes     Dressing applied: Yes       ____________________________________________   INITIAL IMPRESSION / ASSESSMENT AND PLAN / ED COURSE  Diana [name] was evaluated in Emergency Department on 05/28/2019 for the symptoms described in the history of present illness. She was evaluated in the context of the global COVID-19 pandemic, which necessitated consideration that the patient might be at risk for infection with the SARS-CoV-2 virus that causes COVID-19. Institutional protocols and algorithms that pertain to the evaluation of patients at risk for COVID-19 are in a state of rapid change based on information released by regulatory bodies including the CDC and federal and state organizations. These policies and algorithms were followed during the patient's care in the ED.    This is most concerning for sepsis.  Patient has bilateral leg erythema although per patient that is baseline for her but consider overlying cellulitis.  Patient is already on a blood thinner so we will suspicion for PE.  Bedside ultrasound w/out evidence of large cardiac effusion.  Consider pneumonia, UTI, bacteremia, coronavirus and will get further testing.  Patient does have some abdominal pain and a history of diverticulosis so we will get CT to rule out diverticulitis or abscess.  Also given patient's fall yesterday and she did hit her head on a blood thinner will get CT head to evaluate for epidural subdural hematoma.  Sepsis alert called.  Will give patient 500 cc of fluid for her tachycardia and borderline blood pressures.  Holding off on full fluid resuscitation  [facility] Emergency Department Provider Note  ____________________________________________   First MD Initiated Contact with Patient 06/03/2019 1222     (approximate)  I have reviewed the triage vital signs and the nursing notes.   HISTORY  Chief Complaint Fever    HPI PATTRICIA [name] is a 84 y.o. female with A. fib on warfarin, CHF, severe lymphedema who recently had a left shoulder injury and placed in immobilizer who now presents with concern for concern for infection.   Patient had to be febrile to 101, tachycardic to 140 and hypotensive.    Patient says she baseline has redness of her lower extremities.  She denies any new chest pain.  Patient is satting 88% on room air.  Denies feeling SOB or cough. Just says she overall does not feel well, started today, nothing makes it better or worse. She denies any known coronavirus contacts. Ambulates with walker at baseline which has been hard to do with the left arm in immobilizer.         Past Medical History:  Diagnosis Date  . Acute glomerulonephritis with other specified pathological lesion in kidney in disease classified elsewhere(580.81)   . Alveolar aeration decreased   . Anemia   . Arthritis   . Atrial fibrillation ([place])   . Breast cancer ([place]) 09/13/202015   positive, radiation  . Cancer (HCC)    Breast  . CHF (congestive heart failure) ([place])   . Diaphragmatic hernia   . DVT (deep venous thrombosis) ([place])   . Dysrhythmia   . Environmental allergies   . Erosive esophagitis 03/28/2015   esophageal biopsy July 2017 c/w CMV cytopathic changes, negative for H Pylori and dysplasia     . Esophageal reflux   . Esophagitis, CMV ([place]) 05/26/2015   Diagnosed at [facility] with manometry,  Tube feeds started for enteral nutrtion 05/12/15   . Feeding problem    FEEDING TUBE  . Hiatal hernia   . Hyperlipidemia   . Hypertension   . Lower extremity edema   . Lupus (systemic lupus erythematosus) ([place])   .  Neuropathy   . Osteopenia   . Oxygen deficiency    USES HS  . Peripheral neuropathy, hereditary/idiopathic   . Personal history of radiation therapy   . Pneumonia   . Primary pulmonary hypertension ([place])   . Renal insufficiency   . Sciatica   . Shingles   . Shingles (herpes zoster) polyneuropathy March 2013   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  . Shortness of breath dyspnea   . Unspecified hypothyroidism   . Unspecified menopausal and postmenopausal disorder   . Urinary incontinence without sensory awareness   . Vitamin B deficiency     Patient Active Problem List   Diagnosis Date Noted  . Medication monitoring encounter 05/06/2019  . Lymphedema of both lower extremities 04/24/2019  . Problems with swallowing and mastication   . Nausea and vomiting   . Dysphasia   . Food impaction of esophagus   . Stricture and stenosis of esophagus   . Diverticulitis 03/10/2019  . Abdominal pain, chronic, left lower quadrant 12/13/2018  . Osteoporosis 08/11/2018  . Nocturnal hypoxemia due to pulmonary hypertension ([place]) 07/29/2018  . Tubular adenoma of colon 06/08/2018  . Chronic right hip pain 05/09/2018  . Hospital discharge follow-up 12/16/2017  . Ventral hernia without obstruction or gangrene 11/09/2017  . Anticoagulation goal of INR 2 to 3 09/14/2016  . Acquired asplenia 08/21/2016  . Sjogrens syndrome ([place]) 08/21/2016  . Long  appreciate intervals.  ____________________________________________  [place], personally viewed and evaluated these images (plain radiographs) as part of my medical decision making, as well as reviewing the  written report by the radiologist.  ED MD interpretation: Possible developing opacities on the left side.  Official radiology report(s): Dg Chest Portable 1 View  Result Date: 06/17/2019 CLINICAL DATA:  Shortness of breath EXAM: PORTABLE CHEST 1 VIEW COMPARISON:  03/10/2019 FINDINGS: The heart size and mediastinal contours are within normal limits. Calcific aortic knob. Prominent retrocardiac opacity compatible with known large hiatal hernia. Increasing left perihilar and left basilar markings. Lungs hyperexpanded. No large pleural fluid collection. No pneumothorax. IMPRESSION: Increasing left perihilar and left basilar airspace opacity suspicious for developing infection in the appropriate clinical setting. Electronically Signed   By: Davina Poke M.D.   On: 06/14/2019 13:02    ____________________________________________   PROCEDURES  Procedure(s) performed (including Critical Care):  .Critical Care Performed by: Vanessa [place], MD Authorized by: Vanessa [place], MD   Critical care provider statement:    Critical care time (minutes):  45   Critical care was necessary to treat or prevent imminent or life-threatening deterioration of the following conditions:  Sepsis and respiratory failure   Critical care was time spent personally by me on the following activities:  Discussions with consultants, evaluation of patient's response to treatment, examination of patient, ordering and performing treatments and interventions, ordering and review of laboratory studies, ordering and review of radiographic studies, pulse oximetry, re-evaluation of patient's condition, obtaining history from patient or surrogate and review of old charts Ultrasound ED Peripheral IV (Provider)  Date/Time: 06/06/2019 1:06 PM Performed by: Vanessa [place], MD Authorized by: Vanessa [place], MD   Procedure details:    Indications: hydration and hypotension     Location:  Right AC   Bedside Ultrasound Guided: Yes      Images: not archived     Patient tolerated procedure without complications: Yes     Dressing applied: Yes       ____________________________________________   INITIAL IMPRESSION / ASSESSMENT AND PLAN / ED COURSE  Diana [name] was evaluated in Emergency Department on 05/28/2019 for the symptoms described in the history of present illness. She was evaluated in the context of the global COVID-19 pandemic, which necessitated consideration that the patient might be at risk for infection with the SARS-CoV-2 virus that causes COVID-19. Institutional protocols and algorithms that pertain to the evaluation of patients at risk for COVID-19 are in a state of rapid change based on information released by regulatory bodies including the CDC and federal and state organizations. These policies and algorithms were followed during the patient's care in the ED.    This is most concerning for sepsis.  Patient has bilateral leg erythema although per patient that is baseline for her but consider overlying cellulitis.  Patient is already on a blood thinner so we will suspicion for PE.  Bedside ultrasound w/out evidence of large cardiac effusion.  Consider pneumonia, UTI, bacteremia, coronavirus and will get further testing.  Patient does have some abdominal pain and a history of diverticulosis so we will get CT to rule out diverticulitis or abscess.  Also given patient's fall yesterday and she did hit her head on a blood thinner will get CT head to evaluate for epidural subdural hematoma.  Sepsis alert called.  Will give patient 500 cc of fluid for her tachycardia and borderline blood pressures.  Holding off on full fluid resuscitation  appreciate intervals.  ____________________________________________  [place], personally viewed and evaluated these images (plain radiographs) as part of my medical decision making, as well as reviewing the  written report by the radiologist.  ED MD interpretation: Possible developing opacities on the left side.  Official radiology report(s): Dg Chest Portable 1 View  Result Date: 06/17/2019 CLINICAL DATA:  Shortness of breath EXAM: PORTABLE CHEST 1 VIEW COMPARISON:  03/10/2019 FINDINGS: The heart size and mediastinal contours are within normal limits. Calcific aortic knob. Prominent retrocardiac opacity compatible with known large hiatal hernia. Increasing left perihilar and left basilar markings. Lungs hyperexpanded. No large pleural fluid collection. No pneumothorax. IMPRESSION: Increasing left perihilar and left basilar airspace opacity suspicious for developing infection in the appropriate clinical setting. Electronically Signed   By: Davina Poke M.D.   On: 06/14/2019 13:02    ____________________________________________   PROCEDURES  Procedure(s) performed (including Critical Care):  .Critical Care Performed by: Vanessa [place], MD Authorized by: Vanessa [place], MD   Critical care provider statement:    Critical care time (minutes):  45   Critical care was necessary to treat or prevent imminent or life-threatening deterioration of the following conditions:  Sepsis and respiratory failure   Critical care was time spent personally by me on the following activities:  Discussions with consultants, evaluation of patient's response to treatment, examination of patient, ordering and performing treatments and interventions, ordering and review of laboratory studies, ordering and review of radiographic studies, pulse oximetry, re-evaluation of patient's condition, obtaining history from patient or surrogate and review of old charts Ultrasound ED Peripheral IV (Provider)  Date/Time: 06/06/2019 1:06 PM Performed by: Vanessa [place], MD Authorized by: Vanessa [place], MD   Procedure details:    Indications: hydration and hypotension     Location:  Right AC   Bedside Ultrasound Guided: Yes      Images: not archived     Patient tolerated procedure without complications: Yes     Dressing applied: Yes       ____________________________________________   INITIAL IMPRESSION / ASSESSMENT AND PLAN / ED COURSE  Diana [name] was evaluated in Emergency Department on 05/28/2019 for the symptoms described in the history of present illness. She was evaluated in the context of the global COVID-19 pandemic, which necessitated consideration that the patient might be at risk for infection with the SARS-CoV-2 virus that causes COVID-19. Institutional protocols and algorithms that pertain to the evaluation of patients at risk for COVID-19 are in a state of rapid change based on information released by regulatory bodies including the CDC and federal and state organizations. These policies and algorithms were followed during the patient's care in the ED.    This is most concerning for sepsis.  Patient has bilateral leg erythema although per patient that is baseline for her but consider overlying cellulitis.  Patient is already on a blood thinner so we will suspicion for PE.  Bedside ultrasound w/out evidence of large cardiac effusion.  Consider pneumonia, UTI, bacteremia, coronavirus and will get further testing.  Patient does have some abdominal pain and a history of diverticulosis so we will get CT to rule out diverticulitis or abscess.  Also given patient's fall yesterday and she did hit her head on a blood thinner will get CT head to evaluate for epidural subdural hematoma.  Sepsis alert called.  Will give patient 500 cc of fluid for her tachycardia and borderline blood pressures.  Holding off on full fluid resuscitation  [facility] Emergency Department Provider Note  ____________________________________________   First MD Initiated Contact with Patient 06/03/2019 1222     (approximate)  I have reviewed the triage vital signs and the nursing notes.   HISTORY  Chief Complaint Fever    HPI PATTRICIA [name] is a 84 y.o. female with A. fib on warfarin, CHF, severe lymphedema who recently had a left shoulder injury and placed in immobilizer who now presents with concern for concern for infection.   Patient had to be febrile to 101, tachycardic to 140 and hypotensive.    Patient says she baseline has redness of her lower extremities.  She denies any new chest pain.  Patient is satting 88% on room air.  Denies feeling SOB or cough. Just says she overall does not feel well, started today, nothing makes it better or worse. She denies any known coronavirus contacts. Ambulates with walker at baseline which has been hard to do with the left arm in immobilizer.         Past Medical History:  Diagnosis Date  . Acute glomerulonephritis with other specified pathological lesion in kidney in disease classified elsewhere(580.81)   . Alveolar aeration decreased   . Anemia   . Arthritis   . Atrial fibrillation ([place])   . Breast cancer ([place]) 09/13/202015   positive, radiation  . Cancer (HCC)    Breast  . CHF (congestive heart failure) ([place])   . Diaphragmatic hernia   . DVT (deep venous thrombosis) ([place])   . Dysrhythmia   . Environmental allergies   . Erosive esophagitis 03/28/2015   esophageal biopsy July 2017 c/w CMV cytopathic changes, negative for H Pylori and dysplasia     . Esophageal reflux   . Esophagitis, CMV ([place]) 05/26/2015   Diagnosed at [facility] with manometry,  Tube feeds started for enteral nutrtion 05/12/15   . Feeding problem    FEEDING TUBE  . Hiatal hernia   . Hyperlipidemia   . Hypertension   . Lower extremity edema   . Lupus (systemic lupus erythematosus) ([place])   .  Neuropathy   . Osteopenia   . Oxygen deficiency    USES HS  . Peripheral neuropathy, hereditary/idiopathic   . Personal history of radiation therapy   . Pneumonia   . Primary pulmonary hypertension ([place])   . Renal insufficiency   . Sciatica   . Shingles   . Shingles (herpes zoster) polyneuropathy March 2013   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  . Shortness of breath dyspnea   . Unspecified hypothyroidism   . Unspecified menopausal and postmenopausal disorder   . Urinary incontinence without sensory awareness   . Vitamin B deficiency     Patient Active Problem List   Diagnosis Date Noted  . Medication monitoring encounter 05/06/2019  . Lymphedema of both lower extremities 04/24/2019  . Problems with swallowing and mastication   . Nausea and vomiting   . Dysphasia   . Food impaction of esophagus   . Stricture and stenosis of esophagus   . Diverticulitis 03/10/2019  . Abdominal pain, chronic, left lower quadrant 12/13/2018  . Osteoporosis 08/11/2018  . Nocturnal hypoxemia due to pulmonary hypertension ([place]) 07/29/2018  . Tubular adenoma of colon 06/08/2018  . Chronic right hip pain 05/09/2018  . Hospital discharge follow-up 12/16/2017  . Ventral hernia without obstruction or gangrene 11/09/2017  . Anticoagulation goal of INR 2 to 3 09/14/2016  . Acquired asplenia 08/21/2016  . Sjogrens syndrome ([place]) 08/21/2016  . Long  appreciate intervals.  ____________________________________________  [place], personally viewed and evaluated these images (plain radiographs) as part of my medical decision making, as well as reviewing the  written report by the radiologist.  ED MD interpretation: Possible developing opacities on the left side.  Official radiology report(s): Dg Chest Portable 1 View  Result Date: 06/17/2019 CLINICAL DATA:  Shortness of breath EXAM: PORTABLE CHEST 1 VIEW COMPARISON:  03/10/2019 FINDINGS: The heart size and mediastinal contours are within normal limits. Calcific aortic knob. Prominent retrocardiac opacity compatible with known large hiatal hernia. Increasing left perihilar and left basilar markings. Lungs hyperexpanded. No large pleural fluid collection. No pneumothorax. IMPRESSION: Increasing left perihilar and left basilar airspace opacity suspicious for developing infection in the appropriate clinical setting. Electronically Signed   By: Davina Poke M.D.   On: 06/14/2019 13:02    ____________________________________________   PROCEDURES  Procedure(s) performed (including Critical Care):  .Critical Care Performed by: Vanessa [place], MD Authorized by: Vanessa [place], MD   Critical care provider statement:    Critical care time (minutes):  45   Critical care was necessary to treat or prevent imminent or life-threatening deterioration of the following conditions:  Sepsis and respiratory failure   Critical care was time spent personally by me on the following activities:  Discussions with consultants, evaluation of patient's response to treatment, examination of patient, ordering and performing treatments and interventions, ordering and review of laboratory studies, ordering and review of radiographic studies, pulse oximetry, re-evaluation of patient's condition, obtaining history from patient or surrogate and review of old charts Ultrasound ED Peripheral IV (Provider)  Date/Time: 06/06/2019 1:06 PM Performed by: Vanessa [place], MD Authorized by: Vanessa [place], MD   Procedure details:    Indications: hydration and hypotension     Location:  Right AC   Bedside Ultrasound Guided: Yes      Images: not archived     Patient tolerated procedure without complications: Yes     Dressing applied: Yes       ____________________________________________   INITIAL IMPRESSION / ASSESSMENT AND PLAN / ED COURSE  Diana [name] was evaluated in Emergency Department on 05/28/2019 for the symptoms described in the history of present illness. She was evaluated in the context of the global COVID-19 pandemic, which necessitated consideration that the patient might be at risk for infection with the SARS-CoV-2 virus that causes COVID-19. Institutional protocols and algorithms that pertain to the evaluation of patients at risk for COVID-19 are in a state of rapid change based on information released by regulatory bodies including the CDC and federal and state organizations. These policies and algorithms were followed during the patient's care in the ED.    This is most concerning for sepsis.  Patient has bilateral leg erythema although per patient that is baseline for her but consider overlying cellulitis.  Patient is already on a blood thinner so we will suspicion for PE.  Bedside ultrasound w/out evidence of large cardiac effusion.  Consider pneumonia, UTI, bacteremia, coronavirus and will get further testing.  Patient does have some abdominal pain and a history of diverticulosis so we will get CT to rule out diverticulitis or abscess.  Also given patient's fall yesterday and she did hit her head on a blood thinner will get CT head to evaluate for epidural subdural hematoma.  Sepsis alert called.  Will give patient 500 cc of fluid for her tachycardia and borderline blood pressures.  Holding off on full fluid resuscitation

## 2019-06-04 NOTE — Consult Note (Addendum)
Pharmacy Antibiotic Note  Diana [name] is a 84 y.o. female admitted on 06/10/2019 with CAP.  Pharmacy has been consulted for Levofloxacin dosing.  ED orders placed for metronidazole, aztreonam, vancomycin.   It is unclear if the patient as had keflex or cefuroxime since reported allergy. If patient tolerate amoxicillin, may be able to take a cephalosporin. However, since patient was not to fully provide background of allergy. Will re-assess in the AM.   Plan: 1. Will order levofloxacin IV 750 mg initial dose, then levofloxacin PO 500 mg every 48 hours. First dose will start at 2200 tonight. Will re-assess allergy with patient in the AM.     Height: 5' (152.4 cm) Weight: 103 lb 9.9 oz (47 kg) IBW/kg (Calculated) : 45.5  Temp (24hrs), Avg:100 F (37.8 C), Min:99 F (37.2 C), Max:101 F (38.3 C)  Recent Labs  Lab 05/23/2019 1240  WBC 9.7  CREATININE 2.48*  LATICACIDVEN 1.8    Estimated Creatinine Clearance: 12.6 mL/min (A) (by C-G formula based on SCr of 2.48 mg/dL (H)).    Allergies  Allergen Reactions  . Iodinated Diagnostic Agents Hives    Pt states she broke out in hives years ago, has been premedicated in the past.   . Tramadol Rash       . Amoxicillin Other (See Comments)    Reaction:  Stomach cramps   . Cefuroxime Itching  . Metrizamide Hives  . Morphine And Related     Reaction: face flushed    . Propoxyphene     Stomach cramp  . Adhesive [Tape] Rash    Silk tape causes red skin  . Cephalexin Rash  . Sulfa Antibiotics Rash  . Sulfonamide Derivatives Rash  . Venlafaxine Rash    Thank you for allowing pharmacy to be a part of this patient's care.  Rowland Lathe 06/15/2019 3:23 PM

## 2019-06-04 NOTE — ED Notes (Signed)
Son went home, informed him that the ICU will not be able to take patient until 11pm. Son and patient verbalized understanding of this. Informed son that I would update him if there were any changes. Son took patient's 2 pillows home.  At the bedside in a 1 belonging bag at this time is 2 pairs of underwear and fitted bed sheet from home.

## 2019-06-04 NOTE — Consult Note (Signed)
Cardiology Consultation:   Patient ID: Diana [name] MRN: [id number]; DOB: Sep 15, 1935  Admit date: 06/08/2019 Date of Consult: 06/13/2019  Primary Care Provider: Sherlene Shams, MD Primary Cardiologist:[facility], Elza Rafter, Dietrich Pates, MD Primary Electrophysiologist:  None    Patient Profile:   Diana [name] is a 84 y.o. female with a hx of pulmonary embolism/DVT s/p IVC filter, atrial fibrillation on warfarin, diastolic heart failure, severe lymphedema, COPD, and recent mechanical fall with shoulder injury in immobilizer who is being seen today for the evaluation of HFpEF at the request of Dr. Sherryll Burger.  History of Present Illness:   Diana [name] is an 84 year old female with PMH as above and followed by [facility] cardiology for her cardiac issues including a history of diastolic heart failure.  She has a history of atrial fibrillation without clear documented OAC leading up to her pulmonary embolism / DVT 07/2018 and no noted OAC per EMR. Pending [facility] to discuss with patient's primary cardiologist.   She is on 2 L nasal cannula oxygen at night at home.  In 2010, she was found to have elevated mean pulmonary artery pressure by a local institution and started on Tracleer.  She continued to experience worsening in her symptoms and to mid 2013, along with a decrease in exertional capacity.  She was not short of breath at rest but became short of breath with minimal activity.  She reported she slept on one pillow at night.  Initial left/ right heart catheterization 12/2009 (see CV studies below) did not meet criteria for true [facility] per documentation and no coronary artery dz.  Nonetheless, she was started on Bosentan with improvement and noted to be worse once taken off this medication thus restarted it.  Per EMR, suggestion was to trial PDE5i as she had done well on Adcirca. She had repeat right heart cath in 2013 (see CV studies below) confirmed normal pulmonary pressures with normal wedge  pressure and was without evidence of PAH.  It was noted that her dyspnea was likely multifactorial in the setting of hiatal hernia and exercise-induced diastolic heart failure.  She had an episode of substernal chest pain with subsequent stress test that was unremarkable except for the presence of a large hiatal hernia.  In 2015, she was diagnosed with breast cancer and underwent partial mastectomy and radiation that left her feeling fatigued and weak.  In 2016, she had several admissions for GI bleeding and irritated esophagus.  She required placement of a PEG tube and feedings all through the morning.  She required transfusion.  She was noted to have some mild obstructive pulmonary component after PFTs in early 2016. As of her last office visit, she was noted to be euvolemic for the last couple of years.  She was taking Lasix on a as needed basis and had not required any recent doses.  She continued to wear home oxygen at night to prevent hypoxia/vasoconstriction given her pulmonary hypertension and to prevent worsening of her illness.  She was on Toprol 12.5 mg daily for history of atrial fibrillation.  She had recently run out for 2 days and noticed more palpitations.  08/25/17 echo showed normal left ventricular ejection fraction at 55 to 60%, diastolic left ventricular dysfunction, degenerative mitral valve disease, dilated left atrium, and normal right ventricular contractile performance.  She is followed by [facility] cardiology for pulmonary hypertensive venous disease.   She was seen by vascular surgery 08/04/2016 after she was found to have pulmonary embolism/DVT.  Leading up  to the pulmonary embolism/DVT, no documented anticoagulation; however, she reported taking metoprolol succinate 12.5 mg daily for rate control.  She was on warfarin after placement of IVC filter, and after tolerating anticoagulation for several months with warfarin, IVC filter was removed without complications. Postphlebitic changes were  discussed at that time with patient agreeing to wear compression stockings first thing in the morning and remove them in the evening. Given her venous insufficiency of bilateral lower extremities, recommendation was for consideration of lymph pump to prevent edema and prevent sequelae such as ulcers and infections.  Since that time, the patient has reportedly been compliant on her warfarin with continued denial of any signs of acute bleed including hemoptysis, hematuria, and hematochezia.    She presented to [facility] ED on 06/23/19 after she tripped and fell /after mechanical fall.  Per patient, she hit her head but did not lose consciousness. As a result of this fall, she injured her left arm and was wearing a binder at time of cardiac consultation.  Leading up to the fall, she denied chest pain, racing heart rate, palpitations, or presyncope.  She noted ongoing (chronic) shortness of breath (on 2 L home oxygen).  She was not sure if the SOB/DOE had worsened in the last few days.  She reported chronic bilateral lower extremity edema and erythema that was tender to palpation and had not worsened in the last few days.  She continued to wear her compression stockings.  She had reportedly had bilateral lower extremity ulcers that were particularly tender.  She noted that she continued to take her oral diuretic on a PRN basis.  She stated that she slept with the head of the bed elevated for comfort; however, on further questioning, she was not certain that she would be able to lay flat due to her breathing.(previously documented at 1 pillow orthopnea per [facility] cardiology)  She had noted recent reduced appetite and weight loss. She also had an ongoing productive cough with tan phlegm.  Per patient and her son, no recent sick contacts.  Leading up to the admission, she had fever documented at 101 per notes.  In the ED, she triggered sepsis protocol with gentle IVF started.   Heart Pathway Score:     Past Medical  History:  Diagnosis Date   Acute glomerulonephritis with other specified pathological lesion in kidney in disease classified elsewhere(580.81)    Alveolar aeration decreased    Anemia    Arthritis    Atrial fibrillation (HCC)    Breast cancer (HCC) 2020-11-113   positive, radiation   Cancer (HCC)    Breast   CHF (congestive heart failure) (HCC)    Diaphragmatic hernia    DVT (deep venous thrombosis) (HCC)    Dysrhythmia    Environmental allergies    Erosive esophagitis 03/28/2015   esophageal biopsy July 2017 c/w CMV cytopathic changes, negative for H Pylori and dysplasia      Esophageal reflux    Esophagitis, CMV (HCC) 05/26/2015   Diagnosed at [facility] with manometry,  Tube feeds started for enteral nutrtion 05/12/15    Feeding problem    FEEDING TUBE   Hiatal hernia    Hyperlipidemia    Hypertension    Lower extremity edema    Lupus (systemic lupus erythematosus) (HCC)    Neuropathy    Osteopenia    Oxygen deficiency    USES HS   Peripheral neuropathy, hereditary/idiopathic    Personal history of radiation therapy    Pneumonia  Primary pulmonary hypertension (HCC)    Renal insufficiency    Sciatica    Shingles    Shingles (herpes zoster) polyneuropathy March 2013   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   Shortness of breath dyspnea    Unspecified hypothyroidism    Unspecified menopausal and postmenopausal disorder    Urinary incontinence without sensory awareness    Vitamin B deficiency     Past Surgical History:  Procedure Laterality Date   BREAST BIOPSY Left 2015   invasive mammary carcinoma   BREAST LUMPECTOMY Left 2015   invasive mammary carcinoma clear margins but close   BREAST SURGERY     CATARACT EXTRACTION W/PHACO Right 04/05/2016   Procedure: CATARACT EXTRACTION PHACO AND INTRAOCULAR LENS PLACEMENT (IOC);  Surgeon: Galen Manila, MD;  Location: [facility] ORS;  Service: Ophthalmology;  Laterality: Right;  [place] 01:20 AP%  23.9 CDE 19.29 fluid pack lot # [id number]   CATARACT EXTRACTION W/PHACO Left 04/26/2016   Procedure: CATARACT EXTRACTION PHACO AND INTRAOCULAR LENS PLACEMENT (IOC);  Surgeon: Galen Manila, MD;  Location: [facility] ORS;  Service: Ophthalmology;  Laterality: Left;   [place] 00:59 AP% 23.2 CDE 13.83 Fluid pack lot # [id number]   COLONOSCOPY WITH PROPOFOL N/A 06/04/2018   Procedure: COLONOSCOPY WITH PROPOFOL;  Surgeon: Christena Deem, MD;  Location[facility] ENDOSCOPY;  Service: Endoscopy;  Laterality: N/A;   DIAGNOSTIC LAPAROSCOPY     ESOPHAGOGASTRODUODENOSCOPY N/A 07/19/2015   Procedure: ESOPHAGOGASTRODUODENOSCOPY (EGD);  Surgeon: Wallace Cullens, MD;  Location: [facility] ENDOSCOPY;  Service: Gastroenterology;  Laterality: N/A;   ESOPHAGOGASTRODUODENOSCOPY (EGD) WITH PROPOFOL N/A 03/28/2015   Procedure: ESOPHAGOGASTRODUODENOSCOPY (EGD) WITH PROPOFOL;  Surgeon: Earline Mayotte, MD;  Location: [facility] ENDOSCOPY;  Service: Endoscopy;  Laterality: N/A;   ESOPHAGOGASTRODUODENOSCOPY (EGD) WITH PROPOFOL N/A 06/10/2015   Procedure: ESOPHAGOGASTRODUODENOSCOPY (EGD) WITH PROPOFOL;  Surgeon: Christena Deem, MD;  Location: [facility] ENDOSCOPY;  Service: Endoscopy;  Laterality: N/A;   ESOPHAGOGASTRODUODENOSCOPY (EGD) WITH PROPOFOL N/A 10/20/2015   Procedure: ESOPHAGOGASTRODUODENOSCOPY (EGD) WITH PROPOFOL;  Surgeon: Christena Deem, MD;  Location: [facility] ENDOSCOPY;  Service: Endoscopy;  Laterality: N/A;   ESOPHAGOGASTRODUODENOSCOPY (EGD) WITH PROPOFOL N/A 02/23/2016   Procedure: ESOPHAGOGASTRODUODENOSCOPY (EGD) WITH PROPOFOL;  Surgeon: Christena Deem, MD;  Location: [facility] ENDOSCOPY;  Service: Endoscopy;  Laterality: N/A;   ESOPHAGOGASTRODUODENOSCOPY (EGD) WITH PROPOFOL N/A 06/30/2016   Procedure: ESOPHAGOGASTRODUODENOSCOPY (EGD) WITH PROPOFOL;  Surgeon: Christena Deem, MD;  Location: [facility] ENDOSCOPY;  Service: Endoscopy;  Laterality: N/A;   ESOPHAGOGASTRODUODENOSCOPY (EGD) WITH PROPOFOL N/A 01/05/2017   Procedure:  ESOPHAGOGASTRODUODENOSCOPY (EGD) WITH PROPOFOL;  Surgeon: Christena Deem, MD;  Location: [facility] ENDOSCOPY;  Service: Endoscopy;  Laterality: N/A;   ESOPHAGOGASTRODUODENOSCOPY (EGD) WITH PROPOFOL N/A 03/11/2019   Procedure: ESOPHAGOGASTRODUODENOSCOPY (EGD) WITH PROPOFOL;  Surgeon: Midge Minium, MD;  Location: [facility] ENDOSCOPY;  Service: Endoscopy;  Laterality: N/A;   ESOPHAGOGASTRODUODENOSCOPY (EGD) WITH PROPOFOL N/A 04/11/2019   Procedure: ESOPHAGOGASTRODUODENOSCOPY (EGD) WITH PROPOFOL;  Surgeon: Midge Minium, MD;  Location: [facility] ENDOSCOPY;  Service: Endoscopy;  Laterality: N/A;   KYPHOPLASTY N/A 02/10/2015   Procedure: [id number];  Surgeon: Kennedy Bucker, MD;  Location: [facility] ORS;  Service: Orthopedics;  Laterality: N/A;   LAPAROTOMY N/A 03/11/2015   Procedure: REDUCTION OF GASTRIC VOLVULUS, SPLEENECTOMY, GASTRIC TUBE PLACEMENT;  Surgeon: Earline Mayotte, MD;  Location: [facility] ORS;  Service: General;  Laterality: N/A;   MOHS SURGERY     PERIPHERAL VASCULAR CATHETERIZATION N/A 06/12/2015   Procedure: IVC Filter Insertion;  Surgeon: Annice Needy, MD;  Location: [facility] INVASIVE CV LAB;  Service: Cardiovascular;  Laterality:  N/A;   PERIPHERAL VASCULAR CATHETERIZATION N/A 08/23/2016   Procedure: IVC Filter Removal;  Surgeon: Renford Dills, MD;  Location: [facility] INVASIVE CV LAB;  Service: Cardiovascular;  Laterality: N/A;   STOMACH SURGERY     for volvolus     Home Medications:  Prior to Admission medications   Medication Sig Start Date End Date Taking? Authorizing Provider  Ascorbic Acid (VITAMIN C) 1000 MG tablet Take 1,000 mg by mouth daily.   Yes [provider]  Calcium Carbonate-Vitamin D 600-400 MG-UNIT tablet Take 1 tablet by mouth daily.   Yes [provider]  cyanocobalamin (,VITAMIN B-12,) 1000 MCG/ML injection Inject 1 mL (1,000 mcg total) into the muscle every 30 (thirty) days. Pt uses on the 1st of every month. 12/28/16  Yes Sherlene Shams, MD  fentaNYL  (DURAGESIC) 50 MCG/HR Place 1 patch onto the skin every 3 (three) days. 04/30/19  Yes Sherlene Shams, MD  furosemide (LASIX) 20 MG tablet TAKE 1 TABLET DAILY AS NEEDED FOR FLUID RETENTION 04/03/19  Yes Sherlene Shams, MD  hydroxychloroquine (PLAQUENIL) 200 MG tablet Take 200 mg by mouth daily.   Yes [provider]  letrozole (FEMARA) 2.5 MG tablet TAKE 1 TABLET BY MOUTH EVERY DAY 02/23/19  Yes Creig Hines, MD  levothyroxine (SYNTHROID) 112 MCG tablet Take 1 tablet (112 mcg total) by mouth daily. NAME BRAND ONLY SYNTHROID PLEASE  NOTE CURRENT DOSE 04/23/19  Yes Sherlene Shams, MD  losartan (COZAAR) 25 MG tablet TAKE 1 TABLET BY MOUTH EVERYDAY AT BEDTIME 04/12/19  Yes Sherlene Shams, MD  metoCLOPramide (REGLAN) 10 MG tablet TAKE 1 TABLET BY MOUTH 4 TIMES DAILY - BEFORE MEALS AND AT BEDTIME. 06/29/18  Yes Sherlene Shams, MD  metoprolol succinate (TOPROL-XL) 25 MG 24 hr tablet Take 12.5 mg by mouth daily.    Yes [provider]  mycophenolate (CELLCEPT) 500 MG tablet Take 1,000 mg by mouth 2 (two) times daily.   Yes [provider]  ondansetron (ZOFRAN ODT) 4 MG disintegrating tablet Take 1 tablet (4 mg total) by mouth every 8 (eight) hours as needed for nausea or vomiting. 11/29/17  Yes Minna Antis, MD  oxyCODONE-acetaminophen (PERCOCET/ROXICET) 5-325 MG tablet Take 1 tablet by mouth 2 (two) times daily as needed for severe pain. 04/23/19  Yes Sherlene Shams, MD  OXYGEN Inhale 2 mLs into the lungs at bedtime.   Yes [provider]  pantoprazole (PROTONIX) 40 MG tablet Take 40 mg by mouth 2 (two) times daily.   Yes [provider]  Polyvinyl Alcohol-Povidone (REFRESH OP) Place 2 drops into both eyes daily as needed (dry eyes).    Yes [provider]  predniSONE (DELTASONE) 5 MG tablet Take 5 mg by mouth daily.    Yes [provider]  prochlorperazine (COMPAZINE) 5 MG tablet Take 1 tablet (5 mg total) by mouth every 6 (six) hours as  needed for nausea or vomiting. 01/10/19  Yes Sherlene Shams, MD  Saccharomyces boulardii (PROBIOTIC) 250 MG CAPS Take 1 tablet by mouth daily. 12/02/17  Yes Mody, Patricia Pesa, MD  Syringe/Needle, Disp, (SYRINGE 3CC/25GX1") 25G X 1" 3 ML MISC Use for b12 injections 12/28/16  Yes Sherlene Shams, MD  Tadalafil, PAH, (ADCIRCA) 20 MG TABS Take 40 mg by mouth daily.   Yes [provider]  valGANciclovir (VALCYTE) 450 MG tablet Take 1 tablet (450 mg total) by mouth every other day. 05/06/19  Yes Comer, Belia Heman, MD  warfarin (COUMADIN)  1 MG tablet 1 MG ALTERNATING WITH 2 MG DAILY Patient taking differently: 1 MG ALTERNATING WITH 2 MG DAILY Monday,wednesday,fri,sun 05/09/19  Yes Sherlene Shams, MD  warfarin (COUMADIN) 2 MG tablet TAKE 1 TABLET BY MOUTH EVERY DAY Patient taking differently: Take 2 mg by mouth daily. Tuesday, Thursday,saturday 05/20/19  Yes Sherlene Shams, MD  ALPRAZolam Prudy Feeler) 0.25 MG tablet Take 1 tablet (0.25 mg total) by mouth 2 (two) times daily as needed for anxiety. Patient not taking: Reported on 06/12/2019 08/09/18   Sherlene Shams, MD    Inpatient Medications: Scheduled Meds:  Continuous Infusions:  vancomycin     PRN Meds:   Allergies:    Allergies  Allergen Reactions   Iodinated Diagnostic Agents Hives    Pt states she broke out in hives years ago, has been premedicated in the past.    Tramadol Rash        Amoxicillin Other (See Comments)    Reaction:  Stomach cramps    Cefuroxime Itching   Metrizamide Hives   Morphine And Related     Reaction: face flushed     Propoxyphene     Stomach cramp   Adhesive [Tape] Rash    Silk tape causes red skin   Cephalexin Rash   Sulfa Antibiotics Rash   Sulfonamide Derivatives Rash   Venlafaxine Rash    Social History:   Social History   Socioeconomic History   Marital status: Divorced    Spouse name: Not on file   Number of children: Not on file   Years of education: Not on file   Highest  education level: Not on file  Occupational History   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) Health 30 years  Social Network engineer strain: Not hard at all   Food insecurity    Worry: Never true    Inability: Never true   Transportation needs    Medical: No    Non-medical: No  Tobacco Use   Smoking status: Former Smoker    Quit date: 05/25/1991    Years since quitting: 28.0   Smokeless tobacco: Never Used  Substance and Sexual Activity   Alcohol use: No    Frequency: Never    Comment: rare   Drug use: No   Sexual activity: Not on file  Lifestyle   Physical activity    Days per week: 0 days    Minutes per session: 0 min   Stress: Not at all  Relationships   Social connections    Talks on phone: Patient refused    Gets together: Patient refused    Attends religious service: Patient refused    Active member of club or organization: Patient refused    Attends meetings of clubs or organizations: Patient refused    Relationship status: Divorced   Intimate partner violence    Fear of current or ex partner: No    Emotionally abused: No    Physically abused: No    Forced sexual activity: No  Other Topics Concern   Not on file  Social History Narrative   Not on file    Family History:    Family History  Problem Relation Age of Onset   Colon cancer Mother    Cirrhosis Brother        alcoholic   Alcohol abuse Sister    Breast cancer Sister 30   Cancer Brother    Lung cancer Son    Meniere's disease Son  Cirrhosis Brother        nonalcoholic      ROS:  Please see the history of present illness.  Review of Systems  Constitutional: Positive for fever, malaise/fatigue and weight loss.  Respiratory: Positive for cough, sputum production and shortness of breath. Negative for hemoptysis.   Cardiovascular: Positive for orthopnea and leg swelling. Negative for chest pain and palpitations.       Unclear if orthopnea.  Chronic  lower extremity edema with known lymphedema and bilateral ulcers.  Gastrointestinal: Negative for blood in stool, melena and nausea.  Genitourinary: Negative for hematuria.  Musculoskeletal: Positive for falls and myalgias.       Mechanical fall with subsequent myalgias due to fall  Neurological: Negative for dizziness and loss of consciousness.  All other systems reviewed and are negative.   All other ROS reviewed and negative.     Physical Exam/Data:   Vitals:   05/22/2019 1331 06/09/2019 1455 05/29/2019 1458 06/14/2019 1500  BP:   (!) 100/57 103/68  Pulse: (!) 128  (!) 117   Resp: (!) 26  19 20   Temp:  99 F (37.2 C)    TempSrc:  Oral    SpO2: 98%  98%   Weight:      Height:       No intake or output data in the 24 hours ending 05/31/2019 1530 Last 3 Weights 06/19/2019 05/23/2019 05/22/2019  Weight (lbs) 103 lb 9.9 oz 104 lb 103 lb 1.6 oz  Weight (kg) 47 kg 47.174 kg 46.766 kg  Some encounter information is confidential and restricted. Go to Review Flowsheets activity to see all data.     Body mass index is 20.24 kg/m.  General: Frail and elderly woman.  Somewhat somnolent but alert and oriented. HEENT: normal.  On nasal cannula oxygen. Neck: mild elevation of JVP, HJR difficult to assess due to patient right upper quadrant tenderness Vascular: radial pulses 1+ bilaterally Cardiac:  normal S1, S2; tachycardic Lungs: Bilateral coarse breath sounds with rhonchi Abd: soft, right upper quadrant tender  Ext: Mild to moderate bilateral lower extremity edema with corresponding erythema, bilateral ulcers Musculoskeletal:  No deformities Skin: warm and dry  Neuro:  no focal abnormalities noted Psych:  Normal affect   EKG:  The EKG was personally reviewed and demonstrates: Sinus tachycardia at 126 bpm, ectopy , baseline artifact, prolonged QTC, low voltage  Telemetry:  Telemetry was personally reviewed and demonstrates: Sinus tachycardia with rates up into the 130s and with ectopy noted while  in the room  Relevant CV Studies: Diagnostic Review:  CT High Resolution 10/2014  IMPRESSION: 1. No evidence of interstitial lung disease. 2. Hiatal hernia with organoaxial gastric volvulus, slightly increased in size from 2014. 3. Diverticulosis.   6 Minute walk test 10/28/14: she walks 700 feet are 213 m with no desaturations below 95%  PFTs 10/28/14:  FEV1 0.88 L, 48% ( 2013, 1.49 L, 72%) FVC 1.32 L, 53% (2013, 2.07 L 75%) FEV1 to FVC ratio 67% DLCO 47.1%  Stress Test 07/12/12 (I personally reviewed):  Impression:  - Normal myocardial perfusion study.  - No evidence for significant ischemia or scar is noted.  - Global systolic function is normal. The EF is calculated at 54% at rest and 52%-post stress.  - There is mild coronary calcification noted in the distal LAD.  Pulmonary Function Test performed on 06/2012 show:  FEV1 of 72 and an FVC of 75 percentage, FEV1 to FVC 72%, total lung capacity for 0.07 or  82%, residual volumes are 2 which is only 89% predicted, DLCO of 56%.    Echo performed on 04/24/2012  Left ventricular hypertrophy  Normal left ventricular ejection fraction (55-60%)  Diastolic left ventricular dysfunction  Dilated left atrium  Aortic regurgitation (trivial)  Normal right ventricular contractile performance   Echocardiogram 08/2014 Normal left ventricular ejection fraction (55-60%) Diastolic left ventricular dysfunction Degenerative mitral valve disease Dilated left atrium Normal right ventricular contractile performance  Catheterization:  L&RHC 12/24/2009. No epicardial coronary artery disease. Right heart catheterization at that time revealed pulmonary artery pressures of 38/19 with a mean of 27, pulmonary capillary wedge pressure A  wave 24, B wave 30, mean 22. Right atrial A wave 15, B wave 16, mean 15. Unfortunately, an LVEDP was not reported. Pulmonary vascular resistance was calculated at 1.04.   RHC 06/11/12: Right Atrium (mean): 2  Right  Ventricle (s/ed): 21 / 1  Pulmonary Artery (s/d): 22 / 10  Pulmonary Artery (mean): 14 P C W (mean): 9  Thermal Cardiac Output: 6.17 L/min O2 Consumption: 240.73 ml/min  Assumed Arterial Saturation: 95 % Pulmonary Artery Saturation: 70 %  Heart Rate: 74 bpm Fick Output: 5.71 L/min Thermal Pulmonary Vascular: 0.81 Wood Units Thermal index = 3.41 Fick index = 3.15    Laboratory Data:  High Sensitivity Troponin:  No results for input(s): TROPONINIHS in the last 720 hours.   Cardiac EnzymesNo results for input(s): TROPONINI in the last 168 hours. No results for input(s): TROPIPOC in the last 168 hours.  Chemistry Recent Labs  Lab 06-28-19 1240  NA 149*  K 4.5  CL 116*  CO2 26  GLUCOSE 98  BUN 40*  CREATININE 2.48*  CALCIUM 8.0*  GFRNONAA 17*  GFRAA 20*  ANIONGAP 7    Recent Labs  Lab 2019/06/28 1240  PROT 5.9*  ALBUMIN 1.5*  AST 38  ALT 20  ALKPHOS 149*  BILITOT 0.8   Hematology Recent Labs  Lab 06/28/19 1240  WBC 9.7  RBC 3.59*  HGB 11.3*  HCT 34.4*  MCV 95.8  MCH 31.5  MCHC 32.8  RDW 18.7*  PLT 288   BNPNo results for input(s): BNP, PROBNP in the last 168 hours.  DDimer No results for input(s): DDIMER in the last 168 hours.   Radiology/Studies:  Ct Abdomen Pelvis Wo Contrast  Result Date: 2019/06/28 CLINICAL DATA:  Fever EXAM: CT ABDOMEN AND PELVIS WITHOUT CONTRAST TECHNIQUE: Multidetector CT imaging of the abdomen and pelvis was performed following the standard protocol without IV contrast. COMPARISON:  March 10, 2019 FINDINGS: Lower chest: Consolidation the the visualized left lower lobe is. Minimal atelectasis of posterior right base is noted. Hepatobiliary: No focal liver abnormality is seen. No gallstones, gallbladder wall thickening, or biliary dilatation. Pancreas: Unremarkable. No pancreatic ductal dilatation or surrounding inflammatory changes. Spleen: Status post prior splenectomy. Adrenals/Urinary Tract: The adrenal glands are normal. There are  cysts in bilateral kidneys, largest in the pole left kidney unchanged. 1 mm nonobstructing stone is identified in the lower pole right kidney. There is no hydronephrosis bilaterally. The bladder is decompressed with Foley catheter in place. Stomach/Bowel: There is a large hiatal hernia. There is no small bowel obstruction. There is diverticulosis of colon. The appendix is not seen but no inflammation is noted around cecum. There is question soft tissue measuring 3.2 x 1.7 cm in the sigmoid colon with mild generalized sigmoid colonic wall thickening. Vascular/Lymphatic: Aortic atherosclerosis. No enlarged abdominal or pelvic lymph nodes. Reproductive: Uterus and bilateral adnexa are unremarkable. Other:  None. Musculoskeletal: Degenerative joint changes of the with scoliosis of spine are identified. Patient status vertebroplasty lumbar vertebral bodies. IMPRESSION: Consolidation with volume loss the visualized left lower lobe. Findings are more likely due to atelectasis but superimposed pneumonia is not excluded. Mild generalized thickening sigmoid colonic wall with a question soft tissue measuring 3.2 x 1 7 cm in the sigmoid colon. Consider further evaluation with barium enema outpatient basis. Electronically Signed   By: Sherian Rein M.D.   On: 06-24-19 14:38   Ct Head Wo Contrast  Result Date: June 24, 2019 CLINICAL DATA:  Altered level of consciousness.  Recent fall EXAM: CT HEAD WITHOUT CONTRAST TECHNIQUE: Contiguous axial images were obtained from the base of the skull through the vertex without intravenous contrast. COMPARISON:  Feb 12, 2007 FINDINGS: Brain: There is mild diffuse atrophy. There is no intracranial mass, hemorrhage, extra-axial fluid collection, or midline shift. There is patchy small vessel disease in the centra semiovale bilaterally. No acute infarct is demonstrable on this study. Vascular: There is no hyperdense vessel. There is calcification in each carotid siphon region. Skull: Bony  calvarium appears intact. Sinuses/Orbits: There is mucosal thickening in several ethmoid air cells. Other visualized paranasal sinuses are clear. Orbits appear symmetric bilaterally. Other: Mastoids on the right are clear. There is opacification of inferior mastoid air cells on the left. Most of the mastoid air cells on the left are clear. There is a sebaceous cyst containing calcification in the high right parietal vertex scalp region measuring 1.0 x 0.8 cm. IMPRESSION: Atrophy with periventricular small vessel disease. No acute infarct. No mass or hemorrhage. There are foci of arterial vascular calcification. There is mucosal thickening in several ethmoid air cells. There is inferior left mastoid air cell disease. Electronically Signed   By: Bretta Bang III M.D.   On: 06/24/2019 14:29   Dg Chest Portable 1 View  Result Date: 06-24-2019 CLINICAL DATA:  Shortness of breath EXAM: PORTABLE CHEST 1 VIEW COMPARISON:  03/10/2019 FINDINGS: The heart size and mediastinal contours are within normal limits. Calcific aortic knob. Prominent retrocardiac opacity compatible with known large hiatal hernia. Increasing left perihilar and left basilar markings. Lungs hyperexpanded. No large pleural fluid collection. No pneumothorax. IMPRESSION: Increasing left perihilar and left basilar airspace opacity suspicious for developing infection in the appropriate clinical setting. Electronically Signed   By: Duanne Guess M.D.   On: 2019/06/24 13:02    Assessment and Plan:   HFpEF (EF 55-60%) --Chronic SOB. See HPI above for [facility] cardiology imaging and workup with previous cardiac catheterizations and echoes.  Of note, previous right heart cath not consistent with true pulmonary hypertension per primary [facility] cardiologist. --Patient denies any acute increasing shortness of breath or lower extremity edema.  On exam, she appears somewhat dry with corresponding vitals of tachycardia and hypotension.  Reduced oral intake over  the last few days. Exam not consistent with acute volume overload and suspect current status due to infection. --Started on IV fluids due to sepsis.  Recommend close monitoring of volume status given known heart failure. Hold PTA cardiac medications including oral diuresis, beta-blocker, and ARB, due to hypotension and current renal function with sepsis at admission.  No recommendation for IV diuresis.  Daily BMET.  Daily weights.  Continue to monitor I/os on current gentle IVF. --Echo ordered in the ED. Previous as above with notes regarding degenerative mitral valve disease.  Consider that this may be contributing to her chronic shortness of breath as well. We will continue to monitor for  this admission.  History of atrial fibrillation History of pulmonary embolism/DVT - Continue anticoagulation with daily CBC.  Reported compliance with PTA warfarin without any signs or symptoms of acute bleeding. Close monitoring recommended given her history of GI issues as above in HPI.  EKG shows sinus tachycardia.  Continue holding beta-blocker due to hypotension.  AKI --Worsening renal function with creatinine 2.48 (baseline ~ 1.2-1.6) and likely in the setting of current infection / dehydration with reported reduced oral intake.  Continue gentle hydration as above.  Nephrology consult pending.  Caution with contrast.  Renally dose medications.  Sepsis, possible pneumonia - Started on IV antibiotics.  Pulmonary consult pending.  Per IM.  Anemia of chronic disease --Followed by oncology.  Continue to monitor CBC.  For questions or updates, please contact [facility] Please consult [email or web address] for contact info under     Signed, Lennon Alstrom, PA-C  2019/06/10 3:30 PM

## 2019-06-04 NOTE — Telephone Encounter (Signed)
Called patient's house and spoke to patient about this is the second time that patient has came in and we were unable to find a vein for lab work.  Without the labs we are unable to give her her injection.  The [name] spoke to the patient's PCP and both of them agree that getting a Port-A-Cath is a good solution for patient.  I explained to patient that getting a Port-A-Cath would be a little device underneath her skin in the chest area and we would use it for lab work as well as any infusions.  I told her that it takes 2 to 4 hours of being at the hospital after it gets arranged for her to go there.  I also told her she had have to get a COVID test prior to going into get the procedure.  Patient is agreeable to this and when I make the appointments I will let her know

## 2019-06-04 NOTE — ED Notes (Signed)
Patient transported to CT 

## 2019-06-04 NOTE — Consult Note (Signed)
Name: Diana [name] MRN: [id number] DOB: November 04, 1934    ADMISSION DATE:  05/22/2019 CONSULTATION DATE:  05/30/2019  REFERRING MD :  Dr. Manuella Ghazi  CHIEF COMPLAINT:  Fever, generalized weakness and fatigue  BRIEF PATIENT DESCRIPTION:  84 year old female w/ PMH notable for HFpEF, PE/ DVT on warfarin, A-fib, lymphedema admitted 06/19/2019 for acute hypoxic respiratory failure and sepsis secondary to questionable pneumonia versus cellulitis, along with AKI secondary to dehydration.  Pt has left shoulder fracture from fall yesterday. Cardiology and Orthopedics consulted.  SIGNIFICANT EVENTS  9/15>> Admission to Stepdown  STUDIES:  CT Head w/o Contrast 9/15>> No acute infarct. No mass or hemorrhage. Atrophy with periventricular small vessel disease.There are foci of arterial vascular calcification. There is mucosal thickening in several ethmoid air cells. There is inferior left mastoid air cell disease. CT Abdomen & Pelvis 9/15>> Consolidation with volume loss the visualized left lower lobe. Findings are more likely due to atelectasis but superimposed pneumonia is not excluded. Mild generalized thickening sigmoid colonic wall with a question soft tissue measuring 3.2 x 1 7 cm in the sigmoid colon. Consider further evaluation with barium enema outpatient basis. X-ray Left Shoulder 9/15>>Fracture of the left humeral neck. Clinical correlation is Recommended.   CULTURES: SARS-CoV-2 PCR 9/15>> Negative Blood x2 9/15>> Sputum 9/15>> Strep Pneumo urinary antigen 9/15>> Legionella urinary antigen 9/15>>  ANTIBIOTICS: Aztreonam 9/15 x 1 dose Flagyl 9/15 x1 dose  Vancomycin 9/15>> Levofloxacin 9/15>>  HISTORY OF PRESENT ILLNESS:   Diana [name] is a 84 year old female with a past medical history notable for HFp EF, atrial fibrillation on warfarin, PE status post IVC filter placement (subsequent removal), COPD on 2L home O2, severe lymphedema, and recent left shoulder fracture (06/03/19) who presents  to [facility] ED on 06/10/2019 with complaints of generalized malaise and generalized weakness.  Upon presentation to the ED she was noted to be febrile with temperature 101 Fahrenheit, tachycardic with heart rate in the 140s, hypotensive, and hypoxic with O2 sats 88% on room air.  She denied chest pain, shortness of breath, cough, or sick contacts.  Initial work-up in the ED revealed sodium 149, creatinine 2.48, alkaline phosphatase 149, albumin 1.5, hemoglobin 11.3, WBC 9.7, lactic acid 1.8, INR 2.0. Urinanlysis is negative for UTI, and her COVID-19 PCR is negative.  Chest x-ray with left perihilar and left basilar opacity concerning for developing pneumonia.  CT head without contrast is negative for any acute infarct or hemorrhage.  CT abdomen and pelvis concerning for consolidation of the left lower lobe, along with mild generalized thickening of the sigmoid colonic wall with question soft tissue measuring 3.2 x 1.7 cm in the sigmoid colon. Left shoulder x-ray with fracture of the left humerus neck.  Cardiology has been consulted for assistance with CHF management.  She is being admitted to stepdown unit for further work-up and treatment of acute hypoxic respiratory failure and sepsis secondary to questionable pneumonia versus cellulitis, and AKI secondary to dehydration.  PCCM is consulted for further management.  PAST MEDICAL HISTORY :   has a past medical history of Acute glomerulonephritis with other specified pathological lesion in kidney in disease classified elsewhere(580.81), Alveolar aeration decreased, Anemia, Arthritis, Atrial fibrillation ([place]), Breast cancer ([place]) (09/27/202015), Cancer ([place]), CHF (congestive heart failure) ([place]), Diaphragmatic hernia, DVT (deep venous thrombosis) ([place]), Dysrhythmia, Environmental allergies, Erosive esophagitis (03/28/2015), Esophageal reflux, Esophagitis, CMV ([place]) (05/26/2015), Feeding problem, Hiatal hernia, Hyperlipidemia, Hypertension, Lower extremity edema, Lupus  (systemic lupus erythematosus) ([place]), Neuropathy, Osteopenia, Oxygen deficiency, Peripheral neuropathy, hereditary/idiopathic, Personal history of  Name: Diana [name] MRN: [id number] DOB: November 04, 1934    ADMISSION DATE:  05/22/2019 CONSULTATION DATE:  05/30/2019  REFERRING MD :  Dr. Manuella Ghazi  CHIEF COMPLAINT:  Fever, generalized weakness and fatigue  BRIEF PATIENT DESCRIPTION:  84 year old female w/ PMH notable for HFpEF, PE/ DVT on warfarin, A-fib, lymphedema admitted 06/19/2019 for acute hypoxic respiratory failure and sepsis secondary to questionable pneumonia versus cellulitis, along with AKI secondary to dehydration.  Pt has left shoulder fracture from fall yesterday. Cardiology and Orthopedics consulted.  SIGNIFICANT EVENTS  9/15>> Admission to Stepdown  STUDIES:  CT Head w/o Contrast 9/15>> No acute infarct. No mass or hemorrhage. Atrophy with periventricular small vessel disease.There are foci of arterial vascular calcification. There is mucosal thickening in several ethmoid air cells. There is inferior left mastoid air cell disease. CT Abdomen & Pelvis 9/15>> Consolidation with volume loss the visualized left lower lobe. Findings are more likely due to atelectasis but superimposed pneumonia is not excluded. Mild generalized thickening sigmoid colonic wall with a question soft tissue measuring 3.2 x 1 7 cm in the sigmoid colon. Consider further evaluation with barium enema outpatient basis. X-ray Left Shoulder 9/15>>Fracture of the left humeral neck. Clinical correlation is Recommended.   CULTURES: SARS-CoV-2 PCR 9/15>> Negative Blood x2 9/15>> Sputum 9/15>> Strep Pneumo urinary antigen 9/15>> Legionella urinary antigen 9/15>>  ANTIBIOTICS: Aztreonam 9/15 x 1 dose Flagyl 9/15 x1 dose  Vancomycin 9/15>> Levofloxacin 9/15>>  HISTORY OF PRESENT ILLNESS:   Diana [name] is a 84 year old female with a past medical history notable for HFp EF, atrial fibrillation on warfarin, PE status post IVC filter placement (subsequent removal), COPD on 2L home O2, severe lymphedema, and recent left shoulder fracture (06/03/19) who presents  to [facility] ED on 06/10/2019 with complaints of generalized malaise and generalized weakness.  Upon presentation to the ED she was noted to be febrile with temperature 101 Fahrenheit, tachycardic with heart rate in the 140s, hypotensive, and hypoxic with O2 sats 88% on room air.  She denied chest pain, shortness of breath, cough, or sick contacts.  Initial work-up in the ED revealed sodium 149, creatinine 2.48, alkaline phosphatase 149, albumin 1.5, hemoglobin 11.3, WBC 9.7, lactic acid 1.8, INR 2.0. Urinanlysis is negative for UTI, and her COVID-19 PCR is negative.  Chest x-ray with left perihilar and left basilar opacity concerning for developing pneumonia.  CT head without contrast is negative for any acute infarct or hemorrhage.  CT abdomen and pelvis concerning for consolidation of the left lower lobe, along with mild generalized thickening of the sigmoid colonic wall with question soft tissue measuring 3.2 x 1.7 cm in the sigmoid colon. Left shoulder x-ray with fracture of the left humerus neck.  Cardiology has been consulted for assistance with CHF management.  She is being admitted to stepdown unit for further work-up and treatment of acute hypoxic respiratory failure and sepsis secondary to questionable pneumonia versus cellulitis, and AKI secondary to dehydration.  PCCM is consulted for further management.  PAST MEDICAL HISTORY :   has a past medical history of Acute glomerulonephritis with other specified pathological lesion in kidney in disease classified elsewhere(580.81), Alveolar aeration decreased, Anemia, Arthritis, Atrial fibrillation ([place]), Breast cancer ([place]) (09/13/202015), Cancer ([place]), CHF (congestive heart failure) ([place]), Diaphragmatic hernia, DVT (deep venous thrombosis) ([place]), Dysrhythmia, Environmental allergies, Erosive esophagitis (03/28/2015), Esophageal reflux, Esophagitis, CMV ([place]) (05/26/2015), Feeding problem, Hiatal hernia, Hyperlipidemia, Hypertension, Lower extremity edema, Lupus  (systemic lupus erythematosus) ([place]), Neuropathy, Osteopenia, Oxygen deficiency, Peripheral neuropathy, hereditary/idiopathic, Personal history of  Name: Diana [name] MRN: [id number] DOB: November 04, 1934    ADMISSION DATE:  05/22/2019 CONSULTATION DATE:  05/30/2019  REFERRING MD :  Dr. Manuella Ghazi  CHIEF COMPLAINT:  Fever, generalized weakness and fatigue  BRIEF PATIENT DESCRIPTION:  84 year old female w/ PMH notable for HFpEF, PE/ DVT on warfarin, A-fib, lymphedema admitted 06/19/2019 for acute hypoxic respiratory failure and sepsis secondary to questionable pneumonia versus cellulitis, along with AKI secondary to dehydration.  Pt has left shoulder fracture from fall yesterday. Cardiology and Orthopedics consulted.  SIGNIFICANT EVENTS  9/15>> Admission to Stepdown  STUDIES:  CT Head w/o Contrast 9/15>> No acute infarct. No mass or hemorrhage. Atrophy with periventricular small vessel disease.There are foci of arterial vascular calcification. There is mucosal thickening in several ethmoid air cells. There is inferior left mastoid air cell disease. CT Abdomen & Pelvis 9/15>> Consolidation with volume loss the visualized left lower lobe. Findings are more likely due to atelectasis but superimposed pneumonia is not excluded. Mild generalized thickening sigmoid colonic wall with a question soft tissue measuring 3.2 x 1 7 cm in the sigmoid colon. Consider further evaluation with barium enema outpatient basis. X-ray Left Shoulder 9/15>>Fracture of the left humeral neck. Clinical correlation is Recommended.   CULTURES: SARS-CoV-2 PCR 9/15>> Negative Blood x2 9/15>> Sputum 9/15>> Strep Pneumo urinary antigen 9/15>> Legionella urinary antigen 9/15>>  ANTIBIOTICS: Aztreonam 9/15 x 1 dose Flagyl 9/15 x1 dose  Vancomycin 9/15>> Levofloxacin 9/15>>  HISTORY OF PRESENT ILLNESS:   Diana [name] is a 84 year old female with a past medical history notable for HFp EF, atrial fibrillation on warfarin, PE status post IVC filter placement (subsequent removal), COPD on 2L home O2, severe lymphedema, and recent left shoulder fracture (06/03/19) who presents  to [facility] ED on 06/10/2019 with complaints of generalized malaise and generalized weakness.  Upon presentation to the ED she was noted to be febrile with temperature 101 Fahrenheit, tachycardic with heart rate in the 140s, hypotensive, and hypoxic with O2 sats 88% on room air.  She denied chest pain, shortness of breath, cough, or sick contacts.  Initial work-up in the ED revealed sodium 149, creatinine 2.48, alkaline phosphatase 149, albumin 1.5, hemoglobin 11.3, WBC 9.7, lactic acid 1.8, INR 2.0. Urinanlysis is negative for UTI, and her COVID-19 PCR is negative.  Chest x-ray with left perihilar and left basilar opacity concerning for developing pneumonia.  CT head without contrast is negative for any acute infarct or hemorrhage.  CT abdomen and pelvis concerning for consolidation of the left lower lobe, along with mild generalized thickening of the sigmoid colonic wall with question soft tissue measuring 3.2 x 1.7 cm in the sigmoid colon. Left shoulder x-ray with fracture of the left humerus neck.  Cardiology has been consulted for assistance with CHF management.  She is being admitted to stepdown unit for further work-up and treatment of acute hypoxic respiratory failure and sepsis secondary to questionable pneumonia versus cellulitis, and AKI secondary to dehydration.  PCCM is consulted for further management.  PAST MEDICAL HISTORY :   has a past medical history of Acute glomerulonephritis with other specified pathological lesion in kidney in disease classified elsewhere(580.81), Alveolar aeration decreased, Anemia, Arthritis, Atrial fibrillation ([place]), Breast cancer ([place]) (09/27/202015), Cancer ([place]), CHF (congestive heart failure) ([place]), Diaphragmatic hernia, DVT (deep venous thrombosis) ([place]), Dysrhythmia, Environmental allergies, Erosive esophagitis (03/28/2015), Esophageal reflux, Esophagitis, CMV ([place]) (05/26/2015), Feeding problem, Hiatal hernia, Hyperlipidemia, Hypertension, Lower extremity edema, Lupus  (systemic lupus erythematosus) ([place]), Neuropathy, Osteopenia, Oxygen deficiency, Peripheral neuropathy, hereditary/idiopathic, Personal history of  Name: Diana [name] MRN: [id number] DOB: November 04, 1934    ADMISSION DATE:  05/22/2019 CONSULTATION DATE:  05/30/2019  REFERRING MD :  Dr. Manuella Ghazi  CHIEF COMPLAINT:  Fever, generalized weakness and fatigue  BRIEF PATIENT DESCRIPTION:  84 year old female w/ PMH notable for HFpEF, PE/ DVT on warfarin, A-fib, lymphedema admitted 06/19/2019 for acute hypoxic respiratory failure and sepsis secondary to questionable pneumonia versus cellulitis, along with AKI secondary to dehydration.  Pt has left shoulder fracture from fall yesterday. Cardiology and Orthopedics consulted.  SIGNIFICANT EVENTS  9/15>> Admission to Stepdown  STUDIES:  CT Head w/o Contrast 9/15>> No acute infarct. No mass or hemorrhage. Atrophy with periventricular small vessel disease.There are foci of arterial vascular calcification. There is mucosal thickening in several ethmoid air cells. There is inferior left mastoid air cell disease. CT Abdomen & Pelvis 9/15>> Consolidation with volume loss the visualized left lower lobe. Findings are more likely due to atelectasis but superimposed pneumonia is not excluded. Mild generalized thickening sigmoid colonic wall with a question soft tissue measuring 3.2 x 1 7 cm in the sigmoid colon. Consider further evaluation with barium enema outpatient basis. X-ray Left Shoulder 9/15>>Fracture of the left humeral neck. Clinical correlation is Recommended.   CULTURES: SARS-CoV-2 PCR 9/15>> Negative Blood x2 9/15>> Sputum 9/15>> Strep Pneumo urinary antigen 9/15>> Legionella urinary antigen 9/15>>  ANTIBIOTICS: Aztreonam 9/15 x 1 dose Flagyl 9/15 x1 dose  Vancomycin 9/15>> Levofloxacin 9/15>>  HISTORY OF PRESENT ILLNESS:   Diana [name] is a 84 year old female with a past medical history notable for HFp EF, atrial fibrillation on warfarin, PE status post IVC filter placement (subsequent removal), COPD on 2L home O2, severe lymphedema, and recent left shoulder fracture (06/03/19) who presents  to [facility] ED on 06/10/2019 with complaints of generalized malaise and generalized weakness.  Upon presentation to the ED she was noted to be febrile with temperature 101 Fahrenheit, tachycardic with heart rate in the 140s, hypotensive, and hypoxic with O2 sats 88% on room air.  She denied chest pain, shortness of breath, cough, or sick contacts.  Initial work-up in the ED revealed sodium 149, creatinine 2.48, alkaline phosphatase 149, albumin 1.5, hemoglobin 11.3, WBC 9.7, lactic acid 1.8, INR 2.0. Urinanlysis is negative for UTI, and her COVID-19 PCR is negative.  Chest x-ray with left perihilar and left basilar opacity concerning for developing pneumonia.  CT head without contrast is negative for any acute infarct or hemorrhage.  CT abdomen and pelvis concerning for consolidation of the left lower lobe, along with mild generalized thickening of the sigmoid colonic wall with question soft tissue measuring 3.2 x 1.7 cm in the sigmoid colon. Left shoulder x-ray with fracture of the left humerus neck.  Cardiology has been consulted for assistance with CHF management.  She is being admitted to stepdown unit for further work-up and treatment of acute hypoxic respiratory failure and sepsis secondary to questionable pneumonia versus cellulitis, and AKI secondary to dehydration.  PCCM is consulted for further management.  PAST MEDICAL HISTORY :   has a past medical history of Acute glomerulonephritis with other specified pathological lesion in kidney in disease classified elsewhere(580.81), Alveolar aeration decreased, Anemia, Arthritis, Atrial fibrillation ([place]), Breast cancer ([place]) (09/27/202015), Cancer ([place]), CHF (congestive heart failure) ([place]), Diaphragmatic hernia, DVT (deep venous thrombosis) ([place]), Dysrhythmia, Environmental allergies, Erosive esophagitis (03/28/2015), Esophageal reflux, Esophagitis, CMV ([place]) (05/26/2015), Feeding problem, Hiatal hernia, Hyperlipidemia, Hypertension, Lower extremity edema, Lupus  (systemic lupus erythematosus) ([place]), Neuropathy, Osteopenia, Oxygen deficiency, Peripheral neuropathy, hereditary/idiopathic, Personal history of  Name: Diana [name] MRN: [id number] DOB: November 04, 1934    ADMISSION DATE:  05/22/2019 CONSULTATION DATE:  05/30/2019  REFERRING MD :  Dr. Manuella Ghazi  CHIEF COMPLAINT:  Fever, generalized weakness and fatigue  BRIEF PATIENT DESCRIPTION:  84 year old female w/ PMH notable for HFpEF, PE/ DVT on warfarin, A-fib, lymphedema admitted 06/19/2019 for acute hypoxic respiratory failure and sepsis secondary to questionable pneumonia versus cellulitis, along with AKI secondary to dehydration.  Pt has left shoulder fracture from fall yesterday. Cardiology and Orthopedics consulted.  SIGNIFICANT EVENTS  9/15>> Admission to Stepdown  STUDIES:  CT Head w/o Contrast 9/15>> No acute infarct. No mass or hemorrhage. Atrophy with periventricular small vessel disease.There are foci of arterial vascular calcification. There is mucosal thickening in several ethmoid air cells. There is inferior left mastoid air cell disease. CT Abdomen & Pelvis 9/15>> Consolidation with volume loss the visualized left lower lobe. Findings are more likely due to atelectasis but superimposed pneumonia is not excluded. Mild generalized thickening sigmoid colonic wall with a question soft tissue measuring 3.2 x 1 7 cm in the sigmoid colon. Consider further evaluation with barium enema outpatient basis. X-ray Left Shoulder 9/15>>Fracture of the left humeral neck. Clinical correlation is Recommended.   CULTURES: SARS-CoV-2 PCR 9/15>> Negative Blood x2 9/15>> Sputum 9/15>> Strep Pneumo urinary antigen 9/15>> Legionella urinary antigen 9/15>>  ANTIBIOTICS: Aztreonam 9/15 x 1 dose Flagyl 9/15 x1 dose  Vancomycin 9/15>> Levofloxacin 9/15>>  HISTORY OF PRESENT ILLNESS:   Diana [name] is a 84 year old female with a past medical history notable for HFp EF, atrial fibrillation on warfarin, PE status post IVC filter placement (subsequent removal), COPD on 2L home O2, severe lymphedema, and recent left shoulder fracture (06/03/19) who presents  to [facility] ED on 06/10/2019 with complaints of generalized malaise and generalized weakness.  Upon presentation to the ED she was noted to be febrile with temperature 101 Fahrenheit, tachycardic with heart rate in the 140s, hypotensive, and hypoxic with O2 sats 88% on room air.  She denied chest pain, shortness of breath, cough, or sick contacts.  Initial work-up in the ED revealed sodium 149, creatinine 2.48, alkaline phosphatase 149, albumin 1.5, hemoglobin 11.3, WBC 9.7, lactic acid 1.8, INR 2.0. Urinanlysis is negative for UTI, and her COVID-19 PCR is negative.  Chest x-ray with left perihilar and left basilar opacity concerning for developing pneumonia.  CT head without contrast is negative for any acute infarct or hemorrhage.  CT abdomen and pelvis concerning for consolidation of the left lower lobe, along with mild generalized thickening of the sigmoid colonic wall with question soft tissue measuring 3.2 x 1.7 cm in the sigmoid colon. Left shoulder x-ray with fracture of the left humerus neck.  Cardiology has been consulted for assistance with CHF management.  She is being admitted to stepdown unit for further work-up and treatment of acute hypoxic respiratory failure and sepsis secondary to questionable pneumonia versus cellulitis, and AKI secondary to dehydration.  PCCM is consulted for further management.  PAST MEDICAL HISTORY :   has a past medical history of Acute glomerulonephritis with other specified pathological lesion in kidney in disease classified elsewhere(580.81), Alveolar aeration decreased, Anemia, Arthritis, Atrial fibrillation ([place]), Breast cancer ([place]) (09/28/202015), Cancer ([place]), CHF (congestive heart failure) ([place]), Diaphragmatic hernia, DVT (deep venous thrombosis) ([place]), Dysrhythmia, Environmental allergies, Erosive esophagitis (03/28/2015), Esophageal reflux, Esophagitis, CMV ([place]) (05/26/2015), Feeding problem, Hiatal hernia, Hyperlipidemia, Hypertension, Lower extremity edema, Lupus  (systemic lupus erythematosus) ([place]), Neuropathy, Osteopenia, Oxygen deficiency, Peripheral neuropathy, hereditary/idiopathic, Personal history of  Name: Diana [name] MRN: [id number] DOB: November 04, 1934    ADMISSION DATE:  05/22/2019 CONSULTATION DATE:  05/30/2019  REFERRING MD :  Dr. Manuella Ghazi  CHIEF COMPLAINT:  Fever, generalized weakness and fatigue  BRIEF PATIENT DESCRIPTION:  84 year old female w/ PMH notable for HFpEF, PE/ DVT on warfarin, A-fib, lymphedema admitted 06/19/2019 for acute hypoxic respiratory failure and sepsis secondary to questionable pneumonia versus cellulitis, along with AKI secondary to dehydration.  Pt has left shoulder fracture from fall yesterday. Cardiology and Orthopedics consulted.  SIGNIFICANT EVENTS  9/15>> Admission to Stepdown  STUDIES:  CT Head w/o Contrast 9/15>> No acute infarct. No mass or hemorrhage. Atrophy with periventricular small vessel disease.There are foci of arterial vascular calcification. There is mucosal thickening in several ethmoid air cells. There is inferior left mastoid air cell disease. CT Abdomen & Pelvis 9/15>> Consolidation with volume loss the visualized left lower lobe. Findings are more likely due to atelectasis but superimposed pneumonia is not excluded. Mild generalized thickening sigmoid colonic wall with a question soft tissue measuring 3.2 x 1 7 cm in the sigmoid colon. Consider further evaluation with barium enema outpatient basis. X-ray Left Shoulder 9/15>>Fracture of the left humeral neck. Clinical correlation is Recommended.   CULTURES: SARS-CoV-2 PCR 9/15>> Negative Blood x2 9/15>> Sputum 9/15>> Strep Pneumo urinary antigen 9/15>> Legionella urinary antigen 9/15>>  ANTIBIOTICS: Aztreonam 9/15 x 1 dose Flagyl 9/15 x1 dose  Vancomycin 9/15>> Levofloxacin 9/15>>  HISTORY OF PRESENT ILLNESS:   Diana [name] is a 84 year old female with a past medical history notable for HFp EF, atrial fibrillation on warfarin, PE status post IVC filter placement (subsequent removal), COPD on 2L home O2, severe lymphedema, and recent left shoulder fracture (06/03/19) who presents  to [facility] ED on 06/10/2019 with complaints of generalized malaise and generalized weakness.  Upon presentation to the ED she was noted to be febrile with temperature 101 Fahrenheit, tachycardic with heart rate in the 140s, hypotensive, and hypoxic with O2 sats 88% on room air.  She denied chest pain, shortness of breath, cough, or sick contacts.  Initial work-up in the ED revealed sodium 149, creatinine 2.48, alkaline phosphatase 149, albumin 1.5, hemoglobin 11.3, WBC 9.7, lactic acid 1.8, INR 2.0. Urinanlysis is negative for UTI, and her COVID-19 PCR is negative.  Chest x-ray with left perihilar and left basilar opacity concerning for developing pneumonia.  CT head without contrast is negative for any acute infarct or hemorrhage.  CT abdomen and pelvis concerning for consolidation of the left lower lobe, along with mild generalized thickening of the sigmoid colonic wall with question soft tissue measuring 3.2 x 1.7 cm in the sigmoid colon. Left shoulder x-ray with fracture of the left humerus neck.  Cardiology has been consulted for assistance with CHF management.  She is being admitted to stepdown unit for further work-up and treatment of acute hypoxic respiratory failure and sepsis secondary to questionable pneumonia versus cellulitis, and AKI secondary to dehydration.  PCCM is consulted for further management.  PAST MEDICAL HISTORY :   has a past medical history of Acute glomerulonephritis with other specified pathological lesion in kidney in disease classified elsewhere(580.81), Alveolar aeration decreased, Anemia, Arthritis, Atrial fibrillation ([place]), Breast cancer ([place]) (09/27/202015), Cancer ([place]), CHF (congestive heart failure) ([place]), Diaphragmatic hernia, DVT (deep venous thrombosis) ([place]), Dysrhythmia, Environmental allergies, Erosive esophagitis (03/28/2015), Esophageal reflux, Esophagitis, CMV ([place]) (05/26/2015), Feeding problem, Hiatal hernia, Hyperlipidemia, Hypertension, Lower extremity edema, Lupus  (systemic lupus erythematosus) ([place]), Neuropathy, Osteopenia, Oxygen deficiency, Peripheral neuropathy, hereditary/idiopathic, Personal history of

## 2019-06-04 NOTE — ED Triage Notes (Signed)
Pt comes from home as a sepsis alert. Pt febrile at 101 orally, tachy at 140, and hypotensive. Pt recently seen here for injury to her left shoulder and has immobilizer in place. Pt has very warm, swollen, red legs bilaterally. Pt has bilateral rales per EMS. Pt CO2 at 30. Pt AOx4.

## 2019-06-04 NOTE — ED Notes (Signed)
Pt resting quietly at this time, respirations equal and unlabored. Pt wearing mask.  Pt c/o pain in back and left arm, pt declined to be repositioned at this time.  Son Kasandra Knudsen at bedside, updated him on pt's room and visitation policies.   Verbalized understanding.  Kasandra Knudsen can be reached at [phone].

## 2019-06-04 NOTE — Telephone Encounter (Signed)
Call placed Dr Lupita Dawn office to confirm weekly lab draws. The office is currently have a high call volume with extended wait times. Will attempt to try the office again. Diana [name]

## 2019-06-04 NOTE — ED Notes (Signed)
Only one set of blood cultures able to be obtained due to nature of pt being very hard stick and having humerus fracture on left arm with arm in immobilizer.

## 2019-06-04 NOTE — ED Notes (Signed)
IV team at bedside 

## 2019-06-05 ENCOUNTER — Inpatient Hospital Stay: Payer: Medicare Other

## 2019-06-05 ENCOUNTER — Other Ambulatory Visit: Payer: Medicare Other

## 2019-06-05 ENCOUNTER — Inpatient Hospital Stay (HOSPITAL_COMMUNITY)
Admit: 2019-06-05 | Discharge: 2019-06-05 | Disposition: A | Discharge status: Home / Self Care | Payer: Medicare Other | Attending: Internal Medicine | Admitting: Internal Medicine

## 2019-06-05 DIAGNOSIS — L8990 Pressure ulcer of unspecified site, unspecified stage: Secondary | ICD-10-CM | POA: Insufficient documentation

## 2019-06-05 DIAGNOSIS — R0602 Shortness of breath: Secondary | ICD-10-CM

## 2019-06-05 LAB — PROTEIN / CREATININE RATIO, URINE
Creatinine, Urine: 81 mg/dL
Protein Creatinine Ratio: 0.25 mg/mg{Cre} — ABNORMAL HIGH (ref 0.00–0.15)
Total Protein, Urine: 20 mg/dL

## 2019-06-05 LAB — BASIC METABOLIC PANEL
Anion gap: 7 (ref 5–15)
BUN: 43 mg/dL — ABNORMAL HIGH (ref 8–23)
CO2: 23 mmol/L (ref 22–32)
Calcium: 7.5 mg/dL — ABNORMAL LOW (ref 8.9–10.3)
Chloride: 119 mmol/L — ABNORMAL HIGH (ref 98–111)
Creatinine, Ser: 2.33 mg/dL — ABNORMAL HIGH (ref 0.44–1.00)
GFR calc Af Amer: 22 mL/min — ABNORMAL LOW (ref >60)
GFR calc non Af Amer: 19 mL/min — ABNORMAL LOW (ref >60)
Glucose, Bld: 82 mg/dL (ref 70–99)
Potassium: 4.6 mmol/L (ref 3.5–5.1)
Sodium: 149 mmol/L — ABNORMAL HIGH (ref 135–145)

## 2019-06-05 LAB — ECHOCARDIOGRAM COMPLETE
Height: 60 in
Weight: 1657.86 oz

## 2019-06-05 LAB — CBC
HCT: 32.3 % — ABNORMAL LOW (ref 36.0–46.0)
Hemoglobin: 10.3 g/dL — ABNORMAL LOW (ref 12.0–15.0)
MCH: 31.3 pg (ref 26.0–34.0)
MCHC: 31.9 g/dL (ref 30.0–36.0)
MCV: 98.2 fL (ref 80.0–100.0)
Platelets: 243 10*3/uL (ref 150–400)
RBC: 3.29 MIL/uL — ABNORMAL LOW (ref 3.87–5.11)
RDW: 18.6 % — ABNORMAL HIGH (ref 11.5–15.5)
WBC: 12.4 10*3/uL — ABNORMAL HIGH (ref 4.0–10.5)
nRBC: 0 % (ref 0.0–0.2)

## 2019-06-05 LAB — GLUCOSE, CAPILLARY
Glucose-Capillary: 67 mg/dL — ABNORMAL LOW (ref 70–99)
Glucose-Capillary: 87 mg/dL (ref 70–99)

## 2019-06-05 LAB — PROTIME-INR
INR: 1.8 — ABNORMAL HIGH (ref 0.8–1.2)
Prothrombin Time: 20.9 seconds — ABNORMAL HIGH (ref 11.4–15.2)

## 2019-06-05 LAB — STREP PNEUMONIAE URINARY ANTIGEN: Strep Pneumo Urinary Antigen: NEGATIVE

## 2019-06-05 LAB — MRSA PCR SCREENING: MRSA by PCR: NEGATIVE

## 2019-06-05 LAB — TYPE AND SCREEN
ABO/RH(D): O POS
Antibody Screen: NEGATIVE

## 2019-06-05 LAB — PROCALCITONIN: Procalcitonin: 1.83 ng/mL

## 2019-06-05 LAB — APTT: aPTT: 28 seconds (ref 24–36)

## 2019-06-05 LAB — CORTISOL: Cortisol, Plasma: 13.2 ug/dL

## 2019-06-05 MED ORDER — ENSURE ENLIVE PO LIQD
237.0000 mL | Freq: Two times a day (BID) | ORAL | Status: DC
  Administered 2019-06-07: 237 mL via ORAL

## 2019-06-05 MED ORDER — ORAL CARE MOUTH RINSE
15.0000 mL | Freq: Two times a day (BID) | OROMUCOSAL | Status: DC
  Administered 2019-06-05 – 2019-06-06 (×4): 15 mL via OROMUCOSAL

## 2019-06-05 MED ORDER — SODIUM CHLORIDE 0.9 % IV SOLN
3.0000 g | INTRAVENOUS | Status: DC
  Administered 2019-06-05 – 2019-06-06 (×2): 3 g via INTRAVENOUS
  Filled 2019-06-05 (×2): qty 3
  Filled 2019-06-05: qty 8

## 2019-06-05 MED ORDER — WARFARIN SODIUM 2 MG PO TABS
2.0000 mg | ORAL_TABLET | Freq: Once | ORAL | Status: AC
  Administered 2019-06-05: 2 mg via ORAL
  Filled 2019-06-05: qty 1

## 2019-06-05 MED ORDER — ONDANSETRON HCL 4 MG/2ML IJ SOLN
4.0000 mg | Freq: Three times a day (TID) | INTRAMUSCULAR | Status: DC | PRN
  Administered 2019-06-05: 4 mg via INTRAVENOUS
  Filled 2019-06-05: qty 2

## 2019-06-05 MED ORDER — NYSTATIN 100000 UNIT/ML MT SUSP
5.0000 mL | Freq: Four times a day (QID) | OROMUCOSAL | Status: DC
  Administered 2019-06-05 – 2019-06-07 (×5): 500000 [IU] via ORAL
  Filled 2019-06-05 (×8): qty 5

## 2019-06-05 MED ORDER — WARFARIN - PHYSICIAN DOSING INPATIENT: Freq: Every day | Status: DC

## 2019-06-05 MED ORDER — SODIUM CHLORIDE 0.9 % IV BOLUS
500.0000 mL | Freq: Once | INTRAVENOUS | Status: AC
  Administered 2019-06-05: 500 mL via INTRAVENOUS

## 2019-06-05 MED ORDER — SODIUM CHLORIDE 0.45 % IV SOLN
INTRAVENOUS | Status: DC
  Administered 2019-06-05: 01:00:00 via INTRAVENOUS

## 2019-06-05 MED ORDER — LACTATED RINGERS IV BOLUS: 250.0000 mL | Freq: Once | INTRAVENOUS | Status: DC

## 2019-06-05 MED ORDER — OXYCODONE-ACETAMINOPHEN 5-325 MG PO TABS
1.0000 | ORAL_TABLET | Freq: Four times a day (QID) | ORAL | Status: DC | PRN
  Administered 2019-06-05: 22:00:00 1 via ORAL
  Filled 2019-06-05: qty 1

## 2019-06-05 MED ORDER — WARFARIN - PHARMACIST DOSING INPATIENT
Freq: Every day | Status: DC
  Administered 2019-06-06: 18:00:00

## 2019-06-05 MED ORDER — VANCOMYCIN VARIABLE DOSE PER UNSTABLE RENAL FUNCTION (PHARMACIST DOSING): Status: DC

## 2019-06-05 MED ORDER — SODIUM CHLORIDE 0.9 % IV SOLN
INTRAVENOUS | Status: DC | PRN
  Administered 2019-06-05: 250 mL via INTRAVENOUS

## 2019-06-05 MED ORDER — SODIUM CHLORIDE 0.9 % IV SOLN
0.0000 ug/min | INTRAVENOUS | Status: DC
  Administered 2019-06-05: 15:00:00 20 ug/min via INTRAVENOUS
  Administered 2019-06-05: 22:00:00 37.5 ug/min via INTRAVENOUS
  Administered 2019-06-06: 70 ug/min via INTRAVENOUS
  Administered 2019-06-06: 04:00:00 80 ug/min via INTRAVENOUS
  Administered 2019-06-06: 150 ug/min via INTRAVENOUS
  Administered 2019-06-06: 20 ug/min via INTRAVENOUS
  Administered 2019-06-06: 250 ug/min via INTRAVENOUS
  Administered 2019-06-06: 50 ug/min via INTRAVENOUS
  Filled 2019-06-05 (×2): qty 1
  Filled 2019-06-05 (×2): qty 10
  Filled 2019-06-05 (×4): qty 1
  Filled 2019-06-05 (×3): qty 10

## 2019-06-05 MED ORDER — ALBUMIN HUMAN 25 % IV SOLN
12.5000 g | Freq: Two times a day (BID) | INTRAVENOUS | Status: DC
  Administered 2019-06-05 – 2019-06-06 (×4): 12.5 g via INTRAVENOUS
  Filled 2019-06-05 (×2): qty 50
  Filled 2019-06-05: qty 100
  Filled 2019-06-05: qty 50

## 2019-06-05 MED ORDER — INFLUENZA VAC A&B SA ADJ QUAD 0.5 ML IM PRSY: 0.5000 mL | PREFILLED_SYRINGE | INTRAMUSCULAR | Status: DC

## 2019-06-05 NOTE — Consult Note (Signed)
ORTHOPAEDIC CONSULTATION  REQUESTING PHYSICIAN: Delfino Lovett, MD  Chief Complaint:   Left shoulder pain.  History of Present Illness: Diana [name] is a 84 y.o. female with multiple medical problems including congestive heart failure, coronary artery disease, atrial fibrillation requiring Coumadin, acute glomerulonephritis, esophagitis, breast cancer, primary pulmonary hypertension, lower extremity edema, lupus, and oxygen deficiency requiring home O2 supplementation.  The patient apparently lives independently.  She tripped while walking outside her home and fell 2 days ago, injuring her left shoulder.  She presented to an urgent care facility where x-rays demonstrated a minimally impacted/displaced anatomic neck fracture of her proximal humerus.  The patient placed into a shoulder immobilizer.  However, she became acutely ill and septic and was admitted yesterday for further evaluation and treatment.  Because of her recent shoulder injury, I been asked to see the patient in regards to her left shoulder pain.  The patient notes moderate pain in her shoulder, but denies any numbness or paresthesias down her arm to her hand.  Past Medical History:  Diagnosis Date  . Acute glomerulonephritis with other specified pathological lesion in kidney in disease classified elsewhere(580.81)   . Alveolar aeration decreased   . Anemia   . Arthritis   . Atrial fibrillation (HCC)   . Breast cancer (HCC) 06/03/2014   positive, radiation  . Cancer (HCC)    Breast  . CHF (congestive heart failure) (HCC)   . Diaphragmatic hernia   . DVT (deep venous thrombosis) (HCC)   . Dysrhythmia   . Environmental allergies   . Erosive esophagitis 03/28/2015   esophageal biopsy July 2017 c/w CMV cytopathic changes, negative for H Pylori and dysplasia     . Esophageal reflux   . Esophagitis, CMV (HCC) 05/26/2015   Diagnosed at [facility] with manometry,  Tube feeds  started for enteral nutrtion 05/12/15   . Feeding problem    FEEDING TUBE  . Hiatal hernia   . Hyperlipidemia   . Hypertension   . Lower extremity edema   . Lupus (systemic lupus erythematosus) (HCC)   . Neuropathy   . Osteopenia   . Oxygen deficiency    USES HS  . Peripheral neuropathy, hereditary/idiopathic   . Personal history of radiation therapy   . Pneumonia   . Primary pulmonary hypertension (HCC)   . Renal insufficiency   . Sciatica   . Shingles   . Shingles (herpes zoster) polyneuropathy March 2013   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  . Shortness of breath dyspnea   . Unspecified hypothyroidism   . Unspecified menopausal and postmenopausal disorder   . Urinary incontinence without sensory awareness   . Vitamin B deficiency    Past Surgical History:  Procedure Laterality Date  . BREAST BIOPSY Left 2015   invasive mammary carcinoma  . BREAST LUMPECTOMY Left 2015   invasive mammary carcinoma clear margins but close  . BREAST SURGERY    . CATARACT EXTRACTION W/PHACO Right 04/05/2016   Procedure: CATARACT EXTRACTION PHACO AND INTRAOCULAR LENS PLACEMENT (IOC);  Surgeon: Galen Manila, MD;  Location: [facility] ORS;  Service: Ophthalmology;  Laterality: Right;  [place] 01:20 AP% 23.9 CDE 19.29 fluid pack lot # [id number]  . CATARACT EXTRACTION W/PHACO Left 04/26/2016   Procedure: CATARACT EXTRACTION PHACO AND INTRAOCULAR LENS PLACEMENT (IOC);  Surgeon: Galen Manila, MD;  Location: [facility] ORS;  Service: Ophthalmology;  Laterality: Left;   [place] 00:59 AP% 23.2 CDE 13.83 Fluid pack lot # [id number]  . COLONOSCOPY WITH PROPOFOL N/A 06/04/2018   Procedure: COLONOSCOPY WITH  PROPOFOL;  Surgeon: Christena Deem, MD;  Location: [facility] ENDOSCOPY;  Service: Endoscopy;  Laterality: N/A;  . DIAGNOSTIC LAPAROSCOPY    . ESOPHAGOGASTRODUODENOSCOPY N/A 07/19/2015   Procedure: ESOPHAGOGASTRODUODENOSCOPY (EGD);  Surgeon: Wallace Cullens, MD;  Location: [facility] ENDOSCOPY;  Service: Gastroenterology;  Laterality:  N/A;  . ESOPHAGOGASTRODUODENOSCOPY (EGD) WITH PROPOFOL N/A 03/28/2015   Procedure: ESOPHAGOGASTRODUODENOSCOPY (EGD) WITH PROPOFOL;  Surgeon: Earline Mayotte, MD;  Location: [facility] ENDOSCOPY;  Service: Endoscopy;  Laterality: N/A;  . ESOPHAGOGASTRODUODENOSCOPY (EGD) WITH PROPOFOL N/A 06/10/2015   Procedure: ESOPHAGOGASTRODUODENOSCOPY (EGD) WITH PROPOFOL;  Surgeon: Christena Deem, MD;  Location: [facility] ENDOSCOPY;  Service: Endoscopy;  Laterality: N/A;  . ESOPHAGOGASTRODUODENOSCOPY (EGD) WITH PROPOFOL N/A 10/20/2015   Procedure: ESOPHAGOGASTRODUODENOSCOPY (EGD) WITH PROPOFOL;  Surgeon: Christena Deem, MD;  Location: [facility] ENDOSCOPY;  Service: Endoscopy;  Laterality: N/A;  . ESOPHAGOGASTRODUODENOSCOPY (EGD) WITH PROPOFOL N/A 02/23/2016   Procedure: ESOPHAGOGASTRODUODENOSCOPY (EGD) WITH PROPOFOL;  Surgeon: Christena Deem, MD;  Location: [facility] ENDOSCOPY;  Service: Endoscopy;  Laterality: N/A;  . ESOPHAGOGASTRODUODENOSCOPY (EGD) WITH PROPOFOL N/A 06/30/2016   Procedure: ESOPHAGOGASTRODUODENOSCOPY (EGD) WITH PROPOFOL;  Surgeon: Christena Deem, MD;  Location: [facility] ENDOSCOPY;  Service: Endoscopy;  Laterality: N/A;  . ESOPHAGOGASTRODUODENOSCOPY (EGD) WITH PROPOFOL N/A 01/05/2017   Procedure: ESOPHAGOGASTRODUODENOSCOPY (EGD) WITH PROPOFOL;  Surgeon: Christena Deem, MD;  Location: [facility] ENDOSCOPY;  Service: Endoscopy;  Laterality: N/A;  . ESOPHAGOGASTRODUODENOSCOPY (EGD) WITH PROPOFOL N/A 03/11/2019   Procedure: ESOPHAGOGASTRODUODENOSCOPY (EGD) WITH PROPOFOL;  Surgeon: Midge Minium, MD;  Location: [facility] ENDOSCOPY;  Service: Endoscopy;  Laterality: N/A;  . ESOPHAGOGASTRODUODENOSCOPY (EGD) WITH PROPOFOL N/A 04/11/2019   Procedure: ESOPHAGOGASTRODUODENOSCOPY (EGD) WITH PROPOFOL;  Surgeon: Midge Minium, MD;  Location: [facility] ENDOSCOPY;  Service: Endoscopy;  Laterality: N/A;  . KYPHOPLASTY N/A 02/10/2015   Procedure: [id number];  Surgeon: Kennedy Bucker, MD;  Location: [facility] ORS;  Service: Orthopedics;  Laterality:  N/A;  . LAPAROTOMY N/A 03/11/2015   Procedure: REDUCTION OF GASTRIC VOLVULUS, SPLEENECTOMY, GASTRIC TUBE PLACEMENT;  Surgeon: Earline Mayotte, MD;  Location: [facility] ORS;  Service: General;  Laterality: N/A;  . MOHS SURGERY    . PERIPHERAL VASCULAR CATHETERIZATION N/A 06/12/2015   Procedure: IVC Filter Insertion;  Surgeon: Annice Needy, MD;  Location: [facility] INVASIVE CV LAB;  Service: Cardiovascular;  Laterality: N/A;  . PERIPHERAL VASCULAR CATHETERIZATION N/A 08/23/2016   Procedure: IVC Filter Removal;  Surgeon: Renford Dills, MD;  Location: [facility] INVASIVE CV LAB;  Service: Cardiovascular;  Laterality: N/A;  . STOMACH SURGERY     for volvolus   Social History   Socioeconomic History  . Marital status: Divorced    Spouse name: Not on file  . Number of children: Not on file  . Years of education: Not on file  . Highest education level: Not on file  Occupational History  . 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 Financial resource strain: Not hard at all  . Food insecurity    Worry: Never true    Inability: Never true  . Transportation needs    Medical: No    Non-medical: No  Tobacco Use  . Smoking status: Former Smoker    Quit date: 05/25/1991    Years since quitting: 28.0  . Smokeless tobacco: Never Used  Substance and Sexual Activity  . Alcohol use: No    Frequency: Never    Comment: rare  . Drug use: No  . Sexual activity: Not on file  Lifestyle  . Physical activity  Days per week: 0 days    Minutes per session: 0 min  . Stress: Not at all  Relationships  . Social Musician on phone: Patient refused    Gets together: Patient refused    Attends religious service: Patient refused    Active member of club or organization: Patient refused    Attends meetings of clubs or organizations: Patient refused    Relationship status: Divorced  Other Topics Concern  . Not on file  Social History Narrative  . Not on file    Family History  Problem Relation Age of Onset  . Colon cancer Mother   . Cirrhosis Brother        alcoholic  . Alcohol abuse Sister   . Breast cancer Sister 20  . Cancer Brother   . Lung cancer Son   . Meniere's disease Son   . Cirrhosis Brother        nonalcoholic    Allergies  Allergen Reactions  . Iodinated Diagnostic Agents Hives    Pt states she broke out in hives years ago, has been premedicated in the past.   . Tramadol Rash       . Amoxicillin Other (See Comments)    Reaction:  Stomach cramps   . Cefuroxime Itching  . Metrizamide Hives  . Morphine And Related     Reaction: face flushed    . Propoxyphene     Stomach cramp  . Adhesive [Tape] Rash    Silk tape causes red skin  . Cephalexin Rash  . Sulfa Antibiotics Rash  . Sulfonamide Derivatives Rash  . Venlafaxine Rash   Prior to Admission medications   Medication Sig Start Date End Date Taking? Authorizing Provider  Ascorbic Acid (VITAMIN C) 1000 MG tablet Take 1,000 mg by mouth daily.   Yes [provider]  Calcium Carbonate-Vitamin D 600-400 MG-UNIT tablet Take 1 tablet by mouth daily.   Yes [provider]  cyanocobalamin (,VITAMIN B-12,) 1000 MCG/ML injection Inject 1 mL (1,000 mcg total) into the muscle every 30 (thirty) days. Pt uses on the 1st of every month. 12/28/16  Yes Sherlene Shams, MD  fentaNYL (DURAGESIC) 50 MCG/HR Place 1 patch onto the skin every 3 (three) days. 04/30/19  Yes Sherlene Shams, MD  furosemide (LASIX) 20 MG tablet TAKE 1 TABLET DAILY AS NEEDED FOR FLUID RETENTION 04/03/19  Yes Sherlene Shams, MD  hydroxychloroquine (PLAQUENIL) 200 MG tablet Take 200 mg by mouth daily.   Yes [provider]  letrozole (FEMARA) 2.5 MG tablet TAKE 1 TABLET BY MOUTH EVERY DAY 02/23/19  Yes Creig Hines, MD  levothyroxine (SYNTHROID) 112 MCG tablet Take 1 tablet (112 mcg total) by mouth daily. NAME BRAND ONLY SYNTHROID PLEASE  NOTE CURRENT DOSE 04/23/19  Yes Sherlene Shams, MD   losartan (COZAAR) 25 MG tablet TAKE 1 TABLET BY MOUTH EVERYDAY AT BEDTIME 04/12/19  Yes Sherlene Shams, MD  metoCLOPramide (REGLAN) 10 MG tablet TAKE 1 TABLET BY MOUTH 4 TIMES DAILY - BEFORE MEALS AND AT BEDTIME. 06/29/18  Yes Sherlene Shams, MD  metoprolol succinate (TOPROL-XL) 25 MG 24 hr tablet Take 12.5 mg by mouth daily.    Yes [provider]  mycophenolate (CELLCEPT) 500 MG tablet Take 1,000 mg by mouth 2 (two) times daily.   Yes [provider]  ondansetron (ZOFRAN ODT) 4 MG disintegrating tablet Take 1 tablet (4 mg total) by mouth every 8 (eight) hours as needed for  nausea or vomiting. 11/29/17  Yes Minna Antis, MD  oxyCODONE-acetaminophen (PERCOCET/ROXICET) 5-325 MG tablet Take 1 tablet by mouth 2 (two) times daily as needed for severe pain. 04/23/19  Yes Sherlene Shams, MD  OXYGEN Inhale 2 mLs into the lungs at bedtime.   Yes [provider]  pantoprazole (PROTONIX) 40 MG tablet Take 40 mg by mouth 2 (two) times daily.   Yes [provider]  Polyvinyl Alcohol-Povidone (REFRESH OP) Place 2 drops into both eyes daily as needed (dry eyes).    Yes [provider]  predniSONE (DELTASONE) 5 MG tablet Take 5 mg by mouth daily.    Yes [provider]  prochlorperazine (COMPAZINE) 5 MG tablet Take 1 tablet (5 mg total) by mouth every 6 (six) hours as needed for nausea or vomiting. 01/10/19  Yes Sherlene Shams, MD  Saccharomyces boulardii (PROBIOTIC) 250 MG CAPS Take 1 tablet by mouth daily. 12/02/17  Yes Mody, Patricia Pesa, MD  Syringe/Needle, Disp, (SYRINGE 3CC/25GX1") 25G X 1" 3 ML MISC Use for b12 injections 12/28/16  Yes Sherlene Shams, MD  Tadalafil, PAH, (ADCIRCA) 20 MG TABS Take 40 mg by mouth daily.   Yes [provider]  valGANciclovir (VALCYTE) 450 MG tablet Take 1 tablet (450 mg total) by mouth every other day. 05/06/19  Yes Comer, Belia Heman, MD  warfarin (COUMADIN) 1 MG tablet 1 MG ALTERNATING WITH 2 MG DAILY Patient taking  differently: 1 MG ALTERNATING WITH 2 MG DAILY Monday,wednesday,fri,sun 05/09/19  Yes Sherlene Shams, MD  warfarin (COUMADIN) 2 MG tablet TAKE 1 TABLET BY MOUTH EVERY DAY Patient taking differently: Take 2 mg by mouth daily. Tuesday, Thursday,saturday 05/20/19  Yes Sherlene Shams, MD  ALPRAZolam Prudy Feeler) 0.25 MG tablet Take 1 tablet (0.25 mg total) by mouth 2 (two) times daily as needed for anxiety. Patient not taking: Reported on 01-Jul-2019 08/09/18   Sherlene Shams, MD   Ct Abdomen Pelvis Wo Contrast  Result Date: 07-01-19 CLINICAL DATA:  Fever EXAM: CT ABDOMEN AND PELVIS WITHOUT CONTRAST TECHNIQUE: Multidetector CT imaging of the abdomen and pelvis was performed following the standard protocol without IV contrast. COMPARISON:  March 10, 2019 FINDINGS: Lower chest: Consolidation the the visualized left lower lobe is. Minimal atelectasis of posterior right base is noted. Hepatobiliary: No focal liver abnormality is seen. No gallstones, gallbladder wall thickening, or biliary dilatation. Pancreas: Unremarkable. No pancreatic ductal dilatation or surrounding inflammatory changes. Spleen: Status post prior splenectomy. Adrenals/Urinary Tract: The adrenal glands are normal. There are cysts in bilateral kidneys, largest in the pole left kidney unchanged. 1 mm nonobstructing stone is identified in the lower pole right kidney. There is no hydronephrosis bilaterally. The bladder is decompressed with Foley catheter in place. Stomach/Bowel: There is a large hiatal hernia. There is no small bowel obstruction. There is diverticulosis of colon. The appendix is not seen but no inflammation is noted around cecum. There is question soft tissue measuring 3.2 x 1.7 cm in the sigmoid colon with mild generalized sigmoid colonic wall thickening. Vascular/Lymphatic: Aortic atherosclerosis. No enlarged abdominal or pelvic lymph nodes. Reproductive: Uterus and bilateral adnexa are unremarkable. Other: None. Musculoskeletal:  Degenerative joint changes of the with scoliosis of spine are identified. Patient status vertebroplasty lumbar vertebral bodies. IMPRESSION: Consolidation with volume loss the visualized left lower lobe. Findings are more likely due to atelectasis but superimposed pneumonia is not excluded. Mild generalized thickening sigmoid colonic wall with a question soft tissue measuring 3.2 x 1 7 cm in the sigmoid  colon. Consider further evaluation with barium enema outpatient basis. Electronically Signed   By: Sherian Rein M.D.   On: 05/29/2019 14:38   Ct Head Wo Contrast  Result Date: 06/17/2019 CLINICAL DATA:  Altered level of consciousness.  Recent fall EXAM: CT HEAD WITHOUT CONTRAST TECHNIQUE: Contiguous axial images were obtained from the base of the skull through the vertex without intravenous contrast. COMPARISON:  Feb 12, 2007 FINDINGS: Brain: There is mild diffuse atrophy. There is no intracranial mass, hemorrhage, extra-axial fluid collection, or midline shift. There is patchy small vessel disease in the centra semiovale bilaterally. No acute infarct is demonstrable on this study. Vascular: There is no hyperdense vessel. There is calcification in each carotid siphon region. Skull: Bony calvarium appears intact. Sinuses/Orbits: There is mucosal thickening in several ethmoid air cells. Other visualized paranasal sinuses are clear. Orbits appear symmetric bilaterally. Other: Mastoids on the right are clear. There is opacification of inferior mastoid air cells on the left. Most of the mastoid air cells on the left are clear. There is a sebaceous cyst containing calcification in the high right parietal vertex scalp region measuring 1.0 x 0.8 cm. IMPRESSION: Atrophy with periventricular small vessel disease. No acute infarct. No mass or hemorrhage. There are foci of arterial vascular calcification. There is mucosal thickening in several ethmoid air cells. There is inferior left mastoid air cell disease.  Electronically Signed   By: Bretta Bang III M.D.   On: 06/17/2019 14:29   Dg Chest Portable 1 View  Result Date: 05/29/2019 CLINICAL DATA:  Shortness of breath EXAM: PORTABLE CHEST 1 VIEW COMPARISON:  03/10/2019 FINDINGS: The heart size and mediastinal contours are within normal limits. Calcific aortic knob. Prominent retrocardiac opacity compatible with known large hiatal hernia. Increasing left perihilar and left basilar markings. Lungs hyperexpanded. No large pleural fluid collection. No pneumothorax. IMPRESSION: Increasing left perihilar and left basilar airspace opacity suspicious for developing infection in the appropriate clinical setting. Electronically Signed   By: Duanne Guess M.D.   On: 05/30/2019 13:02   Dg Shoulder Left  Result Date: 06/09/2019 CLINICAL DATA:  84 year old female with left shoulder pain. EXAM: LEFT SHOULDER - 2+ VIEW COMPARISON:  Chest radiograph date 05/23/2019 FINDINGS: There is apparent area cortical irregularity with a faint linear density through the left humeral neck concerning for an age indeterminate nondisplaced fracture, possibly acute. Clinical correlation is recommended. CT may provide better evaluation. The bones are osteopenic. There is no dislocation. The soft tissues are unremarkable. Left lung densities better described and seen on the prior radiograph. IMPRESSION: Fracture of the left humeral neck. Clinical correlation is recommended. Electronically Signed   By: Elgie Collard M.D.   On: 06/06/2019 16:49    Positive ROS: All other systems have been reviewed and were otherwise negative with the exception of those mentioned in the HPI and as above.  Physical Exam: General:  Alert, no acute distress Psychiatric:  Patient is competent for consent with normal mood and affect   Cardiovascular:  No pedal edema Respiratory:  No wheezing, non-labored breathing GI:  Abdomen is soft and non-tender Skin:  No lesions in the area of chief  complaint Neurologic:  Sensation intact distally Lymphatic:  No axillary or cervical lymphadenopathy  Orthopedic Exam:  Orthopedic examination is limited to the patient's left shoulder and upper extremity.  There is mild to moderate swelling around the anterior aspect of the shoulder, but no ecchymosis, erythema, or other skin abnormalities are identified.  She has moderate tenderness to palpation over  the anterior aspect of the shoulder.  She also has pain with any attempted active or passive motion of the shoulder.  She is able to dorsiflex and plantarflex her wrist and fingers.  She has intact sensation light touch to all distributions.  She has excellent capillary refill to all digits.  X-rays:  Recent x-rays of left shoulder are available for review and have been reviewed by myself.  These films demonstrate a minimally displaced anatomic neck fracture of the proximal humerus of the left shoulder.  The humeral head is concentrically located within the glenoid.  No significant degenerative changes or other bony abnormalities are identified.  Assessment: Minimally displaced left proximal humerus fracture.  Plan: The treatment options have been discussed with the patient.  Based on the patient's x-ray findings and certainly given her other medical comorbidities, I feel that this can be managed nonsurgically.  She will remain in her shoulder immobilizer at this time.  The shoulder immobilizer may be loosened for personal hygiene, but otherwise should be out at all times.  She may have ice applied to her shoulder for comfort, and receive appropriate pain medication as medically indicated.  Thank you for asking me to participate in the care of this most pleasant yet unfortunate woman.  Please make arrangements for this patient to be seen by either Horris Latino, PA-C, my physician assistant, or myself in the office in 3 to 4 weeks.   Maryagnes Amos, MD  Beeper #:  [phone]  06/05/2019 10:31 AM

## 2019-06-05 NOTE — Progress Notes (Signed)
Pharmacy Antibiotic Note  Diana [name] is a 84 y.o. female admitted on 06/03/2019 with sepsis.  Pharmacy has been consulted for Vancomycin dosing.  Pt has received 1000mg m in ED.    Plan: Monitor renal fxn and dose Vancomycin as appropriate.    Height: 5' (152.4 cm) Weight: 103 lb 9.9 oz (47 kg) IBW/kg (Calculated) : 45.5  Temp (24hrs), Avg:99.1 F (37.3 C), Min:98.1 F (36.7 C), Max:101 F (38.3 C)  Recent Labs  Lab 05/28/2019 1240 05/25/2019 2229 06/05/19 0127  WBC 9.7 13.0* 12.4*  CREATININE 2.48* 2.38*  --   LATICACIDVEN 1.8  --   --     Estimated Creatinine Clearance: 13.1 mL/min (A) (by C-G formula based on SCr of 2.38 mg/dL (H)).    Allergies  Allergen Reactions  . Iodinated Diagnostic Agents Hives    Pt states she broke out in hives years ago, has been premedicated in the past.   . Tramadol Rash       . Amoxicillin Other (See Comments)    Reaction:  Stomach cramps   . Cefuroxime Itching  . Metrizamide Hives  . Morphine And Related     Reaction: face flushed    . Propoxyphene     Stomach cramp  . Adhesive [Tape] Rash    Silk tape causes red skin  . Cephalexin Rash  . Sulfa Antibiotics Rash  . Sulfonamide Derivatives Rash  . Venlafaxine Rash   Antimicrobials this admission: Aztreonam 9/15 x 1 Flagyl 9/15 x 1 Levaquin 9/15  >>  Vancomycin 9/15 >>  Dose adjustments this admission:  Microbiology results:  BCx:   UCx:    Sputum:    MRSA PCR:   Thank you for allowing pharmacy to be a part of this patient's care.  Hart Robinsons A 06/05/2019 1:52 AM

## 2019-06-05 NOTE — Telephone Encounter (Signed)
LPN unable to reach PCP office. Called and spoke with patient's son. He could not verify weekly labs at PCP. States patient is currently in the hospital.  Eugenia Mcalpine

## 2019-06-05 NOTE — Consult Note (Signed)
[address] [place], [place] [address] Phone [phone]. Fax [phone]  Date: 06/05/2019                  Patient Name:  Diana [name]  MRN: [id number]  DOB: 01/23/1935  Age / Sex: 84 y.o., female         PCP: Sherlene Shams, MD                 Service Requesting Consult: IM/ Delfino Lovett, MD                 Reason for Consult: ARF            History of Present Illness: Patient is a 84 y.o. female with medical problems of chronic CHF, history of PE with IVC filter (removed), chronic warfarin, pulmonary hypertension, atrial fibrillation, COPD, severe lymphedema, lupus nephritis, hypothyroidism who was admitted to [facility] on 06/08/2019 for evaluation of fever, tachycardia and hypotension.  Patient has injury to her left shoulder with immobilizer in place. She was admitted for evaluation and management of sepsis, LE cellulitis Patient reports she has poor appetite and has lost significant amount of weight She had dysphagia earlier in the year Currently treated with vanc and levaquin  Medications: Outpatient medications: Medications Prior to Admission  Medication Sig Dispense Refill Last Dose  . Ascorbic Acid (VITAMIN C) 1000 MG tablet Take 1,000 mg by mouth daily.   06/03/2019 at Unknown time  . Calcium Carbonate-Vitamin D 600-400 MG-UNIT tablet Take 1 tablet by mouth daily.   06/03/2019 at Unknown time  . cyanocobalamin (,VITAMIN B-12,) 1000 MCG/ML injection Inject 1 mL (1,000 mcg total) into the muscle every 30 (thirty) days. Pt uses on the 1st of every month. 10 mL 1 Past Month at Unknown time  . fentaNYL (DURAGESIC) 50 MCG/HR Place 1 patch onto the skin every 3 (three) days. 10 patch 0 Past Week at Unknown time  . furosemide (LASIX) 20 MG tablet TAKE 1 TABLET DAILY AS NEEDED FOR FLUID RETENTION 30 tablet 3 prn at prn  . hydroxychloroquine (PLAQUENIL) 200 MG tablet Take 200 mg by mouth daily.   06/03/2019 at Unknown time  . letrozole (FEMARA) 2.5 MG tablet TAKE 1  TABLET BY MOUTH EVERY DAY 90 tablet 1 06/03/2019 at Unknown time  . levothyroxine (SYNTHROID) 112 MCG tablet Take 1 tablet (112 mcg total) by mouth daily. NAME BRAND ONLY SYNTHROID PLEASE  NOTE CURRENT DOSE 90 tablet 0 06/03/2019 at Unknown time  . losartan (COZAAR) 25 MG tablet TAKE 1 TABLET BY MOUTH EVERYDAY AT BEDTIME 30 tablet 2 06/03/2019 at Unknown time  . metoCLOPramide (REGLAN) 10 MG tablet TAKE 1 TABLET BY MOUTH 4 TIMES DAILY - BEFORE MEALS AND AT BEDTIME. 360 tablet 0 06/03/2019 at Unknown time  . metoprolol succinate (TOPROL-XL) 25 MG 24 hr tablet Take 12.5 mg by mouth daily.    06/03/2019 at Unknown time  . mycophenolate (CELLCEPT) 500 MG tablet Take 1,000 mg by mouth 2 (two) times daily.   06/03/2019 at Unknown time  . ondansetron (ZOFRAN ODT) 4 MG disintegrating tablet Take 1 tablet (4 mg total) by mouth every 8 (eight) hours as needed for nausea or vomiting. 20 tablet 0 prn at prn  . oxyCODONE-acetaminophen (PERCOCET/ROXICET) 5-325 MG tablet Take 1 tablet by mouth 2 (two) times daily as needed for severe pain. 60 tablet 0 prn at prn  . OXYGEN Inhale 2 mLs into the lungs at bedtime.   05/29/2019 at Unknown time  .  pantoprazole (PROTONIX) 40 MG tablet Take 40 mg by mouth 2 (two) times daily.   06/03/2019 at Unknown time  . Polyvinyl Alcohol-Povidone (REFRESH OP) Place 2 drops into both eyes daily as needed (dry eyes).    prn at prn  . predniSONE (DELTASONE) 5 MG tablet Take 5 mg by mouth daily.    06/03/2019 at Unknown time  . prochlorperazine (COMPAZINE) 5 MG tablet Take 1 tablet (5 mg total) by mouth every 6 (six) hours as needed for nausea or vomiting. 30 tablet 5 prn at prn  . Saccharomyces boulardii (PROBIOTIC) 250 MG CAPS Take 1 tablet by mouth daily. 14 capsule  Past Week at Unknown time  . Syringe/Needle, Disp, (SYRINGE 3CC/25GX1") 25G X 1" 3 ML MISC Use for b12 injections 50 each 0 unknown at unknown  . Tadalafil, PAH, (ADCIRCA) 20 MG TABS Take 40 mg by mouth daily.   06/03/2019 at Unknown  time  . valGANciclovir (VALCYTE) 450 MG tablet Take 1 tablet (450 mg total) by mouth every other day. 15 tablet 1 Past Week at Unknown time  . warfarin (COUMADIN) 1 MG tablet 1 MG ALTERNATING WITH 2 MG DAILY (Patient taking differently: 1 MG ALTERNATING WITH 2 MG DAILY Monday,wednesday,fri,sun) 45 tablet 1 06/03/2019 at Unknown time  . warfarin (COUMADIN) 2 MG tablet TAKE 1 TABLET BY MOUTH EVERY DAY (Patient taking differently: Take 2 mg by mouth daily. Tuesday, Thursday,saturday) 30 tablet 1 Past Week at Unknown time  . ALPRAZolam (XANAX) 0.25 MG tablet Take 1 tablet (0.25 mg total) by mouth 2 (two) times daily as needed for anxiety. (Patient not taking: Reported on 06/13/2019) 20 tablet 5 Not Taking at Unknown time    Current medications: Current Facility-Administered Medications  Medication Dose Route Frequency Provider Last Rate Last Dose  . 0.45 % sodium chloride infusion   Intravenous Continuous Harlon Ditty D, NP 50 mL/hr at 06/05/19 0600    . 0.9 %  sodium chloride infusion   Intravenous PRN Harlon Ditty D, NP 5 mL/hr at 06/05/19 0600    . calcium-vitamin D (OSCAL WITH D) 500-200 MG-UNIT per tablet 1 tablet  1 tablet Oral Daily Sherryll Burger, Vipul, MD      . Chlorhexidine Gluconate Cloth 2 % PADS 6 each  6 each Topical Q0600 Delfino Lovett, MD   6 each at 06/05/19 0616  . [START ON 06/20/2019] cyanocobalamin ((VITAMIN B-12)) injection 1,000 mcg  1,000 mcg Intramuscular Q30 days Delfino Lovett, MD      . hydroxychloroquine (PLAQUENIL) tablet 200 mg  200 mg Oral Daily Sherryll Burger, Vipul, MD      . letrozole [facility]) tablet 2.5 mg  2.5 mg Oral Daily Delfino Lovett, MD      . MEDLINE mouth rinse  15 mL Mouth Rinse BID Harlon Ditty D, NP      . metoprolol succinate (TOPROL-XL) 24 hr tablet 12.5 mg  12.5 mg Oral Daily Sherryll Burger, Vipul, MD      . mycophenolate (CELLCEPT) capsule 1,000 mg  1,000 mg Oral BID Delfino Lovett, MD      . ondansetron (ZOFRAN) injection 4 mg  4 mg Intravenous Q8H PRN Erin Fulling, MD   4 mg at  06/05/19 0947  . ondansetron (ZOFRAN-ODT) disintegrating tablet 4 mg  4 mg Oral Q8H PRN Delfino Lovett, MD      . pantoprazole (PROTONIX) EC tablet 40 mg  40 mg Oral BID Sherryll Burger, Vipul, MD      . predniSONE (DELTASONE) tablet 5 mg  5 mg Oral Daily Sherryll Burger,  Vipul, MD      . prochlorperazine (COMPAZINE) tablet 5 mg  5 mg Oral Q6H PRN Delfino Lovett, MD      . saccharomyces boulardii (FLORASTOR) capsule 250 mg  250 mg Oral Daily Sherryll Burger, Vipul, MD      . sodium chloride 0.9 % bolus 1,000 mL  1,000 mL Intravenous Once Sherryll Burger, Vipul, MD       And  . sodium chloride 0.9 % bolus 500 mL  500 mL Intravenous Once Delfino Lovett, MD      . tadalafil (PAH) (ADCIRCA) tablet 40 mg  40 mg Oral Daily Sherryll Burger, Vipul, MD      . valGANciclovir (VALCYTE) 450 MG tablet TABS 450 mg  450 mg Oral Q48H Shah, Vipul, MD      . vancomycin variable dose per unstable renal function (pharmacist dosing)   Does not apply See admin instructions Wayland Denis, RPH      . vitamin C (ASCORBIC ACID) tablet 1,000 mg  1,000 mg Oral Daily Delfino Lovett, MD          Allergies: Allergies  Allergen Reactions  . Iodinated Diagnostic Agents Hives    Pt states she broke out in hives years ago, has been premedicated in the past.   . Tramadol Rash       . Amoxicillin Other (See Comments)    Reaction:  Stomach cramps   . Cefuroxime Itching  . Metrizamide Hives  . Morphine And Related     Reaction: face flushed    . Propoxyphene     Stomach cramp  . Adhesive [Tape] Rash    Silk tape causes red skin  . Cephalexin Rash  . Sulfa Antibiotics Rash  . Sulfonamide Derivatives Rash  . Venlafaxine Rash      Past Medical History: Past Medical History:  Diagnosis Date  . Acute glomerulonephritis with other specified pathological lesion in kidney in disease classified elsewhere(580.81)   . Alveolar aeration decreased   . Anemia   . Arthritis   . Atrial fibrillation (HCC)   . Breast cancer (HCC) 06/03/2014   positive, radiation  . Cancer (HCC)    Breast   . CHF (congestive heart failure) (HCC)   . Diaphragmatic hernia   . DVT (deep venous thrombosis) (HCC)   . Dysrhythmia   . Environmental allergies   . Erosive esophagitis 03/28/2015   esophageal biopsy July 2017 c/w CMV cytopathic changes, negative for H Pylori and dysplasia     . Esophageal reflux   . Esophagitis, CMV (HCC) 05/26/2015   Diagnosed at [facility] with manometry,  Tube feeds started for enteral nutrtion 05/12/15   . Feeding problem    FEEDING TUBE  . Hiatal hernia   . Hyperlipidemia   . Hypertension   . Lower extremity edema   . Lupus (systemic lupus erythematosus) (HCC)   . Neuropathy   . Osteopenia   . Oxygen deficiency    USES HS  . Peripheral neuropathy, hereditary/idiopathic   . Personal history of radiation therapy   . Pneumonia   . Primary pulmonary hypertension (HCC)   . Renal insufficiency   . Sciatica   . Shingles   . Shingles (herpes zoster) polyneuropathy March 2013   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  . Shortness of breath dyspnea   . Unspecified hypothyroidism   . Unspecified menopausal and postmenopausal disorder   . Urinary incontinence without sensory awareness   . Vitamin B deficiency      Past Surgical History: Past Surgical History:  Procedure Laterality Date  . BREAST BIOPSY Left 2015   invasive mammary carcinoma  . BREAST LUMPECTOMY Left 2015   invasive mammary carcinoma clear margins but close  . BREAST SURGERY    . CATARACT EXTRACTION W/PHACO Right 04/05/2016   Procedure: CATARACT EXTRACTION PHACO AND INTRAOCULAR LENS PLACEMENT (IOC);  Surgeon: Galen Manila, MD;  Location: [facility] ORS;  Service: Ophthalmology;  Laterality: Right;  [place] 01:20 AP% 23.9 CDE 19.29 fluid pack lot # [id number]  . CATARACT EXTRACTION W/PHACO Left 04/26/2016   Procedure: CATARACT EXTRACTION PHACO AND INTRAOCULAR LENS PLACEMENT (IOC);  Surgeon: Galen Manila, MD;  Location: [facility] ORS;  Service: Ophthalmology;  Laterality: Left;   [place] 00:59 AP% 23.2 CDE 13.83 Fluid  pack lot # [id number]  . COLONOSCOPY WITH PROPOFOL N/A 06/04/2018   Procedure: COLONOSCOPY WITH PROPOFOL;  Surgeon: Christena Deem, MD;  Location[facility] ENDOSCOPY;  Service: Endoscopy;  Laterality: N/A;  . DIAGNOSTIC LAPAROSCOPY    . ESOPHAGOGASTRODUODENOSCOPY N/A 07/19/2015   Procedure: ESOPHAGOGASTRODUODENOSCOPY (EGD);  Surgeon: Wallace Cullens, MD;  Location: [facility] ENDOSCOPY;  Service: Gastroenterology;  Laterality: N/A;  . ESOPHAGOGASTRODUODENOSCOPY (EGD) WITH PROPOFOL N/A 03/28/2015   Procedure: ESOPHAGOGASTRODUODENOSCOPY (EGD) WITH PROPOFOL;  Surgeon: Earline Mayotte, MD;  Location: [facility] ENDOSCOPY;  Service: Endoscopy;  Laterality: N/A;  . ESOPHAGOGASTRODUODENOSCOPY (EGD) WITH PROPOFOL N/A 06/10/2015   Procedure: ESOPHAGOGASTRODUODENOSCOPY (EGD) WITH PROPOFOL;  Surgeon: Christena Deem, MD;  Location: [facility] ENDOSCOPY;  Service: Endoscopy;  Laterality: N/A;  . ESOPHAGOGASTRODUODENOSCOPY (EGD) WITH PROPOFOL N/A 10/20/2015   Procedure: ESOPHAGOGASTRODUODENOSCOPY (EGD) WITH PROPOFOL;  Surgeon: Christena Deem, MD;  Location: [facility] ENDOSCOPY;  Service: Endoscopy;  Laterality: N/A;  . ESOPHAGOGASTRODUODENOSCOPY (EGD) WITH PROPOFOL N/A 02/23/2016   Procedure: ESOPHAGOGASTRODUODENOSCOPY (EGD) WITH PROPOFOL;  Surgeon: Christena Deem, MD;  Location: [facility] ENDOSCOPY;  Service: Endoscopy;  Laterality: N/A;  . ESOPHAGOGASTRODUODENOSCOPY (EGD) WITH PROPOFOL N/A 06/30/2016   Procedure: ESOPHAGOGASTRODUODENOSCOPY (EGD) WITH PROPOFOL;  Surgeon: Christena Deem, MD;  Location: [facility] ENDOSCOPY;  Service: Endoscopy;  Laterality: N/A;  . ESOPHAGOGASTRODUODENOSCOPY (EGD) WITH PROPOFOL N/A 01/05/2017   Procedure: ESOPHAGOGASTRODUODENOSCOPY (EGD) WITH PROPOFOL;  Surgeon: Christena Deem, MD;  Location: [facility] ENDOSCOPY;  Service: Endoscopy;  Laterality: N/A;  . ESOPHAGOGASTRODUODENOSCOPY (EGD) WITH PROPOFOL N/A 03/11/2019   Procedure: ESOPHAGOGASTRODUODENOSCOPY (EGD) WITH PROPOFOL;  Surgeon: Midge Minium, MD;  Location:  [facility] ENDOSCOPY;  Service: Endoscopy;  Laterality: N/A;  . ESOPHAGOGASTRODUODENOSCOPY (EGD) WITH PROPOFOL N/A 04/11/2019   Procedure: ESOPHAGOGASTRODUODENOSCOPY (EGD) WITH PROPOFOL;  Surgeon: Midge Minium, MD;  Location: [facility] ENDOSCOPY;  Service: Endoscopy;  Laterality: N/A;  . KYPHOPLASTY N/A 02/10/2015   Procedure: [id number];  Surgeon: Kennedy Bucker, MD;  Location: [facility] ORS;  Service: Orthopedics;  Laterality: N/A;  . LAPAROTOMY N/A 03/11/2015   Procedure: REDUCTION OF GASTRIC VOLVULUS, SPLEENECTOMY, GASTRIC TUBE PLACEMENT;  Surgeon: Earline Mayotte, MD;  Location: [facility] ORS;  Service: General;  Laterality: N/A;  . MOHS SURGERY    . PERIPHERAL VASCULAR CATHETERIZATION N/A 06/12/2015   Procedure: IVC Filter Insertion;  Surgeon: Annice Needy, MD;  Location: [facility] INVASIVE CV LAB;  Service: Cardiovascular;  Laterality: N/A;  . PERIPHERAL VASCULAR CATHETERIZATION N/A 08/23/2016   Procedure: IVC Filter Removal;  Surgeon: Renford Dills, MD;  Location: [facility] INVASIVE CV LAB;  Service: Cardiovascular;  Laterality: N/A;  . STOMACH SURGERY     for volvolus     Family History: Family History  Problem Relation Age of Onset  . Colon cancer Mother   . Cirrhosis Brother  alcoholic  . Alcohol abuse Sister   . Breast cancer Sister 54  . Cancer Brother   . Lung cancer Son   . Meniere's disease Son   . Cirrhosis Brother        nonalcoholic      Social History: Social History   Socioeconomic History  . Marital status: Divorced    Spouse name: Not on file  . Number of children: Not on file  . Years of education: Not on file  . Highest education level: Not on file  Occupational History  . 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 Financial resource strain: Not hard at all  . Food insecurity    Worry: Never true    Inability: Never true  . Transportation needs    Medical: No    Non-medical: No  Tobacco Use  . Smoking status: Former Smoker     Quit date: 05/25/1991    Years since quitting: 28.0  . Smokeless tobacco: Never Used  Substance and Sexual Activity  . Alcohol use: No    Frequency: Never    Comment: rare  . Drug use: No  . Sexual activity: Not on file  Lifestyle  . Physical activity    Days per week: 0 days    Minutes per session: 0 min  . Stress: Not at all  Relationships  . Social Musician on phone: Patient refused    Gets together: Patient refused    Attends religious service: Patient refused    Active member of club or organization: Patient refused    Attends meetings of clubs or organizations: Patient refused    Relationship status: Divorced  . Intimate partner violence    Fear of current or ex partner: No    Emotionally abused: No    Physically abused: No    Forced sexual activity: No  Other Topics Concern  . Not on file  Social History Narrative  . Not on file     Review of Systems: Gen: Feeling poorly, reports weight loss, poor appetite HEENT: Denies vision or hearing problems CV: Denies chest pain or shortness of breath Resp: No cough or sputum production GI: Poor appetite, difficulty swallowing ability GU : Denies any difficulty with urination MS: Left humerus fracture, unable to move her left arm Derm: Rash over legs bilaterally  Psych: Denies any complaints Heme: No complaints Neuro: No complaints Endocrine.  No complaints  Vital Signs: Blood pressure (!) 90/57, pulse 99, temperature 98.9 F (37.2 C), temperature source Oral, resp. rate 16, height 5' (1.524 m), weight 47 kg, SpO2 100 %.   Intake/Output Summary (Last 24 hours) at 06/05/2019 1024 Last data filed at 06/05/2019 0600 Gross per 24 hour  Intake 407.68 ml  Output 475 ml  Net -67.32 ml    Weight trends: [employer]   05/26/2019 1219  Weight: 47 kg    Physical Exam: General:  Thin, cachectic, elderly, lying in the bed  HEENT  anicteric, dry oral mucous membranes,?  Oral thrush  Neck:  Supple, no  masses  Lungs:  Normal respiratory effort, limited exam clear anteriorly and laterally  Heart::  Regular, no rub  Abdomen:  Soft, mild epigastric tenderness  Extremities:  2+ dependent pitting edema  Neurologic:  Alert, oriented, able to answer questions appropriately  Skin:  Bilateral LE redness , tender to touch  Foley: In place       Lab results: Basic  Metabolic Panel: Recent Labs  Lab 05/21/2019 1240 06/18/2019 2229 06/05/19 0127  NA 149* 148* 149*  K 4.5 4.4 4.6  CL 116* 118* 119*  CO2 26 23 23   GLUCOSE 98 95 82  BUN 40* 42* 43*  CREATININE 2.48* 2.38* 2.33*  CALCIUM 8.0* 7.4* 7.5*    Liver Function Tests: Recent Labs  Lab 06/03/2019 2229  AST 31  ALT 16  ALKPHOS 138*  BILITOT 0.8  PROT 4.8*  ALBUMIN 1.2*   No results for input(s): LIPASE, AMYLASE in the last 168 hours. No results for input(s): AMMONIA in the last 168 hours.  CBC: Recent Labs  Lab 06/19/2019 1240 06/15/2019 2229 06/05/19 0127  WBC 9.7 13.0* 12.4*  NEUTROABS 7.1  --   --   HGB 11.3* 9.6* 10.3*  HCT 34.4* 29.3* 32.3*  MCV 95.8 95.4 98.2  PLT 288 193 243    Cardiac Enzymes: No results for input(s): CKTOTAL, TROPONINI in the last 168 hours.  BNP: Invalid input(s): POCBNP  CBG: Recent Labs  Lab 06/05/19 0002 06/05/19 0721  GLUCAP 87 67*    Microbiology: Recent Results (from the past 720 hour(s))  Culture, blood (Routine x 2)     Status: None (Preliminary result)   Collection Time: 05/25/2019 12:40 PM   Specimen: BLOOD  Result Value Ref Range Status   Specimen Description BLOOD BLOOD RIGHT ARM  Final   Special Requests   Final    BOTTLES DRAWN AEROBIC AND ANAEROBIC Blood Culture adequate volume   Culture   Final    NO GROWTH < 24 HOURS Performed at [facility], [address] [place], [place] [address]    Report Status PENDING  Incomplete  SARS Coronavirus 2 [facility] order, Performed in [facility] hospital lab) Nasopharyngeal Nasopharyngeal Swab     Status: None    Collection Time: 05/25/2019 12:40 PM   Specimen: Nasopharyngeal Swab  Result Value Ref Range Status   SARS Coronavirus 2 NEGATIVE NEGATIVE Final    Comment: (NOTE) If result is NEGATIVE SARS-CoV-2 target nucleic acids are NOT DETECTED. The SARS-CoV-2 RNA is generally detectable in upper and lower  respiratory specimens during the acute phase of infection. The lowest  concentration of SARS-CoV-2 viral copies this assay can detect is 250  copies / mL. A negative result does not preclude SARS-CoV-2 infection  and should not be used as the sole basis for treatment or other  patient management decisions.  A negative result may occur with  improper specimen collection / handling, submission of specimen other  than nasopharyngeal swab, presence of viral mutation(s) within the  areas targeted by this assay, and inadequate number of viral copies  (<250 copies / mL). A negative result must be combined with clinical  observations, patient history, and epidemiological information. If result is POSITIVE SARS-CoV-2 target nucleic acids are DETECTED. The SARS-CoV-2 RNA is generally detectable in upper and lower  respiratory specimens dur ing the acute phase of infection.  Positive  results are indicative of active infection with SARS-CoV-2.  Clinical  correlation with patient history and other diagnostic information is  necessary to determine patient infection status.  Positive results do  not rule out bacterial infection or co-infection with other viruses. If result is PRESUMPTIVE POSTIVE SARS-CoV-2 nucleic acids MAY BE PRESENT.   A presumptive positive result was obtained on the submitted specimen  and confirmed on repeat testing.  While 2019 novel coronavirus  (SARS-CoV-2) nucleic acids may be present in the submitted sample  additional confirmatory  testing may be necessary for epidemiological  and / or clinical management purposes  to differentiate between  SARS-CoV-2 and other Sarbecovirus  currently known to infect humans.  If clinically indicated additional testing with an alternate test  methodology [id number]) is advised. The SARS-CoV-2 RNA is generally  detectable in upper and lower respiratory sp ecimens during the acute  phase of infection. The expected result is Negative. Fact Sheet for Patients:  BoilerBrush.com.cy Fact Sheet for Healthcare Providers: https://pope.com/ This test is not yet approved or cleared by the [place] FDA and has been authorized for detection and/or diagnosis of SARS-CoV-2 by FDA under an Emergency Use Authorization (EUA).  This EUA will remain in effect (meaning this test can be used) for the duration of the COVID-19 declaration under Section 564(b)(1) of the Act, 21 U.S.C. section 360bbb-3(b)(1), unless the authorization is terminated or revoked sooner. Performed at [facility], [address] [place], [place] [address]   MRSA PCR Screening     Status: None   Collection Time: 06/05/19 12:09 AM   Specimen: Nasal Mucosa; Nasopharyngeal  Result Value Ref Range Status   MRSA by PCR NEGATIVE NEGATIVE Final    Comment:        The GeneXpert MRSA Assay (FDA approved for NASAL specimens only), is one component of a comprehensive MRSA colonization surveillance program. It is not intended to diagnose MRSA infection nor to guide or monitor treatment for MRSA infections. Performed at [facility], [address] [place], [place] [address]   Culture, blood (x 2)     Status: None (Preliminary result)   Collection Time: 06/05/19  1:03 AM   Specimen: BLOOD  Result Value Ref Range Status   Specimen Description BLOOD BLOOD RIGHT HAND  Final   Special Requests   Final    BOTTLES DRAWN AEROBIC ONLY Blood Culture results may not be optimal due to an inadequate volume of blood received in culture bottles   Culture   Final    NO GROWTH < 12 HOURS Performed at [facility], [address] [place], [place] [address]    Report Status PENDING  Incomplete  Culture, blood (x 2)     Status: None (Preliminary result)   Collection Time: 06/05/19  1:27 AM   Specimen: BLOOD  Result Value Ref Range Status   Specimen Description BLOOD RIGHT WRIST  Final   Special Requests   Final    BOTTLES DRAWN AEROBIC ONLY Blood Culture results may not be optimal due to an inadequate volume of blood received in culture bottles   Culture   Final    NO GROWTH < 12 HOURS Performed at [facility], [address] [place], [place] [address]    Report Status PENDING  Incomplete     Coagulation Studies: Recent Labs    05/23/2019 1240 06/05/19 0127  LABPROT 22.2* 20.9*  INR 2.0* 1.8*    Urinalysis: Recent Labs    05/23/2019 1240  COLORURINE YELLOW*  LABSPEC 1.012  PHURINE 5.0  GLUCOSEU NEGATIVE  HGBUR NEGATIVE  BILIRUBINUR NEGATIVE  KETONESUR NEGATIVE  PROTEINUR NEGATIVE  NITRITE NEGATIVE  LEUKOCYTESUR NEGATIVE        Imaging: Ct Abdomen Pelvis Wo Contrast  Result Date: 05/26/2019 CLINICAL DATA:  Fever EXAM: CT ABDOMEN AND PELVIS WITHOUT CONTRAST TECHNIQUE: Multidetector CT imaging of the abdomen and pelvis was performed following the standard protocol without IV contrast. COMPARISON:  March 10, 2019 FINDINGS: Lower chest: Consolidation the the visualized left lower lobe  is. Minimal atelectasis of posterior right base is noted. Hepatobiliary: No focal liver abnormality is seen. No gallstones, gallbladder wall thickening, or biliary dilatation. Pancreas: Unremarkable. No pancreatic ductal dilatation or surrounding inflammatory changes. Spleen: Status post prior splenectomy. Adrenals/Urinary Tract: The adrenal glands are normal. There are cysts in bilateral kidneys, largest in the pole left kidney unchanged. 1 mm nonobstructing stone is identified in the lower pole right kidney. There is no hydronephrosis bilaterally. The bladder is decompressed with  Foley catheter in place. Stomach/Bowel: There is a large hiatal hernia. There is no small bowel obstruction. There is diverticulosis of colon. The appendix is not seen but no inflammation is noted around cecum. There is question soft tissue measuring 3.2 x 1.7 cm in the sigmoid colon with mild generalized sigmoid colonic wall thickening. Vascular/Lymphatic: Aortic atherosclerosis. No enlarged abdominal or pelvic lymph nodes. Reproductive: Uterus and bilateral adnexa are unremarkable. Other: None. Musculoskeletal: Degenerative joint changes of the with scoliosis of spine are identified. Patient status vertebroplasty lumbar vertebral bodies. IMPRESSION: Consolidation with volume loss the visualized left lower lobe. Findings are more likely due to atelectasis but superimposed pneumonia is not excluded. Mild generalized thickening sigmoid colonic wall with a question soft tissue measuring 3.2 x 1 7 cm in the sigmoid colon. Consider further evaluation with barium enema outpatient basis. Electronically Signed   By: Sherian Rein M.D.   On: 05/31/2019 14:38   Ct Head Wo Contrast  Result Date: 05/23/2019 CLINICAL DATA:  Altered level of consciousness.  Recent fall EXAM: CT HEAD WITHOUT CONTRAST TECHNIQUE: Contiguous axial images were obtained from the base of the skull through the vertex without intravenous contrast. COMPARISON:  Feb 12, 2007 FINDINGS: Brain: There is mild diffuse atrophy. There is no intracranial mass, hemorrhage, extra-axial fluid collection, or midline shift. There is patchy small vessel disease in the centra semiovale bilaterally. No acute infarct is demonstrable on this study. Vascular: There is no hyperdense vessel. There is calcification in each carotid siphon region. Skull: Bony calvarium appears intact. Sinuses/Orbits: There is mucosal thickening in several ethmoid air cells. Other visualized paranasal sinuses are clear. Orbits appear symmetric bilaterally. Other: Mastoids on the right are  clear. There is opacification of inferior mastoid air cells on the left. Most of the mastoid air cells on the left are clear. There is a sebaceous cyst containing calcification in the high right parietal vertex scalp region measuring 1.0 x 0.8 cm. IMPRESSION: Atrophy with periventricular small vessel disease. No acute infarct. No mass or hemorrhage. There are foci of arterial vascular calcification. There is mucosal thickening in several ethmoid air cells. There is inferior left mastoid air cell disease. Electronically Signed   By: Bretta Bang III M.D.   On: 06/18/2019 14:29   Dg Chest Portable 1 View  Result Date: 06/12/2019 CLINICAL DATA:  Shortness of breath EXAM: PORTABLE CHEST 1 VIEW COMPARISON:  03/10/2019 FINDINGS: The heart size and mediastinal contours are within normal limits. Calcific aortic knob. Prominent retrocardiac opacity compatible with known large hiatal hernia. Increasing left perihilar and left basilar markings. Lungs hyperexpanded. No large pleural fluid collection. No pneumothorax. IMPRESSION: Increasing left perihilar and left basilar airspace opacity suspicious for developing infection in the appropriate clinical setting. Electronically Signed   By: Duanne Guess M.D.   On: 06/10/2019 13:02   Dg Shoulder Left  Result Date: 05/28/2019 CLINICAL DATA:  84 year old female with left shoulder pain. EXAM: LEFT SHOULDER - 2+ VIEW COMPARISON:  Chest radiograph date 06/10/2019 FINDINGS: There is apparent area cortical  irregularity with a faint linear density through the left humeral neck concerning for an age indeterminate nondisplaced fracture, possibly acute. Clinical correlation is recommended. CT may provide better evaluation. The bones are osteopenic. There is no dislocation. The soft tissues are unremarkable. Left lung densities better described and seen on the prior radiograph. IMPRESSION: Fracture of the left humeral neck. Clinical correlation is recommended. Electronically  Signed   By: Elgie Collard M.D.   On: 06/09/2019 16:49      Assessment & Plan: Pt is a 83 y.o. caucasian  female with systemic lupus erythematosus, remote lupus nephritis 1992, sicca symptoms, heart failure with preserved ejection fraction, pulmonary hypertension, history of DVT requiring chronic anticoagulation,, atrial fibrillation, COPD, lymphedema was admitted on 06/14/2019 with cellulitis and sepsis.   #.  Acute kidney injury, urinalysis negative for protein, blood, 0-5 WBC - cause of AKI not clear but may be related to AKI/ATN secondary to sepsis -Creatinine has been increasing since July of this year -Continue to treat sepsis -Consider stress dose steroids for hypotension - will obtain urine P/C ratio  #  Chronic kidney disease st 3.   Baseline creatinine 1.2/GFR 42 from March 13, 2019  #.  Anemia Lab Results  Component Value Date   HGB 10.3 (L) 06/05/2019   -Monitor hemoglobin closely  #.  Generalized edema and hypoalbuminemia - 3rd spacing of fluid -Start IV albumin supplementation   #Systemic lupus erythematosus with history of lupus nephritis in the past.  Most recent urine protein to creatinine ratio 0.29 from March 06, 2019.  Continue Immunosuppression with mycophenolate, prednisone.   Also on Plaquenil. Patient is followed by [facility] as outpatient  #Left humeral neck fracture -Immobilization of left arm as per primary team  #Thickening of sigmoid colon noted on CT abdomen -Further work-up as outpatient    LOS: 1 Fey Coghill 9/16/202010:24 AM    Note: This note was prepared with Dragon dictation. Any transcription errors are unintentional

## 2019-06-05 NOTE — Progress Notes (Signed)
*  PRELIMINARY RESULTS* Echocardiogram 2D Echocardiogram has been performed.  Sherrie Sport 06/05/2019, 9:46 AM

## 2019-06-05 NOTE — Progress Notes (Signed)
CPT via bed initiated on patient. CPT lasted for approximately 2 minutes. Patient requested CPT stop due to pain and feeling nauseous. CPT stopped at this time. Patient was able to cough up moderate amount of white/tan secretions. NTS not needed at this time. Patient being placed on Bipap, states she does not feel nauseous at this time but will notify RT or RN if nausea returns. Will continue to monitor.

## 2019-06-05 NOTE — Consult Note (Signed)
ANTICOAGULATION CONSULT NOTE - Follow Up Consult  Pharmacy Consult for Warfarin Indication: Atrial fibrillation with hx of PE/DVT  Allergies  Allergen Reactions  . Iodinated Diagnostic Agents Hives    Pt states she broke out in hives years ago, has been premedicated in the past.   . Tramadol Rash       . Amoxicillin Other (See Comments)    Reaction:  Stomach cramps   . Cefuroxime Itching  . Metrizamide Hives  . Morphine And Related     Reaction: face flushed    . Propoxyphene     Stomach cramp  . Adhesive [Tape] Rash    Silk tape causes red skin  . Cephalexin Rash  . Sulfa Antibiotics Rash  . Sulfonamide Derivatives Rash  . Venlafaxine Rash    Patient Measurements: Height: 5' (152.4 cm) Weight: 103 lb 9.9 oz (47 kg) IBW/kg (Calculated) : 45.5  Vital Signs: Temp: 99 F (37.2 C) (09/16 0800) Temp Source: Oral (09/16 0800) BP: 92/57 (09/16 1130) Pulse Rate: 105 (09/16 1130)  Labs: Recent Labs    06/06/2019 1240 06/03/2019 2229 06/05/19 0127  HGB 11.3* 9.6* 10.3*  HCT 34.4* 29.3* 32.3*  PLT 288 193 243  APTT  --   --  28  LABPROT 22.2*  --  20.9*  INR 2.0*  --  1.8*  CREATININE 2.48* 2.38* 2.33*    Estimated Creatinine Clearance: 13.4 mL/min (A) (by C-G formula based on SCr of 2.33 mg/dL (H)).   Medications:  Per patient home regimen is: Warfarin 1 mg MoWeFr and 2 mg on all other days  Assessment: 84 y/o F with atrial fibrillation with hx of DVT/PE on warfarin. Patient was admitted with sepsis secondary to cellulitis/PNA. No administrations of warfarin since admission. Last dose outpatient is listed as "past week" per medication reconciliation on admission. Drug interactions include antibiotics. LFTs on admission WNL.   Goal of Therapy:  INR 2-3   Plan:  Will give 2 mg tonight due to missed dose yesterday. Continue to monitor daily INR and CBC.   [place] Resident 06/05/2019,12:25 PM

## 2019-06-05 NOTE — Progress Notes (Signed)
[facility] [place] [facility]   PATIENT NAME: Diana [name]    MR#:  [id number]  DATE OF BIRTH:  [birth year]  SUBJECTIVE:  CHIEF COMPLAINT:  No chief complaint on file. hypotensive, short of breath  REVIEW OF SYSTEMS:  Review of Systems  Constitutional: Positive for weight loss. Negative for diaphoresis, fever and malaise/fatigue.  HENT: Negative for ear discharge, ear pain, hearing loss, nosebleeds, sore throat and tinnitus.   Eyes: Negative for blurred vision and pain.  Respiratory: Positive for shortness of breath. Negative for cough, hemoptysis and wheezing.   Cardiovascular: Negative for chest pain, palpitations, orthopnea and leg swelling.  Gastrointestinal: Negative for abdominal pain, blood in stool, constipation, diarrhea, heartburn, nausea and vomiting.  Genitourinary: Negative for dysuria, frequency and urgency.  Musculoskeletal: Negative for back pain and myalgias.  Skin: Negative for itching and rash.  Neurological: Negative for dizziness, tingling, tremors, focal weakness, seizures, weakness and headaches.  Psychiatric/Behavioral: Negative for depression. The patient is not nervous/anxious.    DRUG ALLERGIES:   Allergies  Allergen Reactions   Iodinated Diagnostic Agents Hives    Pt states she broke out in hives years ago, has been premedicated in the past.    Tramadol Rash        Amoxicillin Other (See Comments)    Reaction:  Stomach cramps    Cefuroxime Itching   Metrizamide Hives   Morphine And Related     Reaction: face flushed     Propoxyphene     Stomach cramp   Adhesive [Tape] Rash    Silk tape causes red skin   Cephalexin Rash   Sulfa Antibiotics Rash   Sulfonamide Derivatives Rash   Venlafaxine Rash   VITALS:  Blood pressure (!) 74/49, pulse (!) 115, temperature 98.9 F (37.2 C), temperature source Oral, resp. rate (!) 22, height 5' (1.524 m), weight 47 kg, SpO2 92 %. PHYSICAL EXAMINATION:  Physical  Exam Constitutional:      General: She is in acute distress.     Appearance: She is cachectic. She is ill-appearing and toxic-appearing.  HENT:     Head: Normocephalic and atraumatic.  Eyes:     Conjunctiva/sclera: Conjunctivae normal.     Pupils: Pupils are equal, round, and reactive to light.  Neck:     Musculoskeletal: Normal range of motion and neck supple.     Thyroid: No thyromegaly.     Trachea: No tracheal deviation.  Cardiovascular:     Rate and Rhythm: Normal rate and regular rhythm.     Heart sounds: Normal heart sounds.  Pulmonary:     Effort: Accessory muscle usage and respiratory distress present.     Breath sounds: Decreased breath sounds present. No wheezing.  Chest:     Chest wall: No tenderness.  Abdominal:     General: Bowel sounds are normal. There is no distension.     Palpations: Abdomen is soft.     Tenderness: There is no abdominal tenderness.  Musculoskeletal: Normal range of motion.  Skin:    General: Skin is warm and dry.     Findings: No rash.  Neurological:     Mental Status: She is alert and oriented to person, place, and time.     Cranial Nerves: No cranial nerve deficit.    LABORATORY PANEL:  Female CBC Recent Labs  Lab 06/05/19 0127  WBC 12.4*  HGB 10.3*  HCT 32.3*  PLT 243   ------------------------------------------------------------------------------------------------------------------ Chemistries  Recent Labs  Lab 05/23/2019 2229 06/05/19  0127  NA 148* 149*  K 4.4 4.6  CL 118* 119*  CO2 23 23  GLUCOSE 95 82  BUN 42* 43*  CREATININE 2.38* 2.33*  CALCIUM 7.4* 7.5*  AST 31  --   ALT 16  --   ALKPHOS 138*  --   BILITOT 0.8  --    RADIOLOGY:  US Renal  Result Date: 06/05/2019 CLINICAL DATA:  84 year old female with acute renal insufficiency. EXAM: RENAL / URINARY TRACT ULTRASOUND COMPLETE COMPARISON:  CT of the abdomen pelvis dated 2019/06/30 FINDINGS: Right Kidney: Renal measurements: 10.4 x 4.4 x 5.1 cm = volume: 120 mL.  There is mild increased renal parenchymal echogenicity. There is a 5 mm upper pole nonobstructing calculus. Smaller additional nonobstructing stone may be present. No hydronephrosis. There is a 1.2 x 1.2 x 1.6 cm lower pole cyst. Left Kidney: Renal measurements: 8.3 x 5.0 x 4.0 cm = volume: 90 mL. Mild increased echogenicity. No hydronephrosis or shadowing stone. There is a 4.1 x 3.0 x 3.0 cm partially exophytic cyst in the interpolar left kidney. Bladder: A Foley catheter is noted. The urinary bladder is distended despite presence of a Foley. The ureteral jets are not visualized. Fluid containing structure superior to the right kidney likely represents the gallbladder. IMPRESSION: 1. Mildly echogenic kidneys likely related to underlying medical renal disease. 2. Nonobstructing right renal stone.  No hydronephrosis. 3. Bilateral renal cysts. Electronically Signed   By: Elgie Collard M.D.   On: 06/05/2019 12:59   Dg Shoulder Left  Result Date: Jun 30, 2019 CLINICAL DATA:  84 year old female with left shoulder pain. EXAM: LEFT SHOULDER - 2+ VIEW COMPARISON:  Chest radiograph date Jun 30, 2019 FINDINGS: There is apparent area cortical irregularity with a faint linear density through the left humeral neck concerning for an age indeterminate nondisplaced fracture, possibly acute. Clinical correlation is recommended. CT may provide better evaluation. The bones are osteopenic. There is no dislocation. The soft tissues are unremarkable. Left lung densities better described and seen on the prior radiograph. IMPRESSION: Fracture of the left humeral neck. Clinical correlation is recommended. Electronically Signed   By: Elgie Collard M.D.   On: Jun 30, 2019 16:49   ASSESSMENT AND PLAN:  Patient is a84 y.o.femalewith lupus nephritis, SLE, pulmonary hypertension, history of DVT on Coumadin anemia of chronic kidney disease here is being admitted for sepsis  *Septic shock -needing pressors -Present on admission,  likely source lung   *Pneumonia -Continue IV antibiotics -Appreciate PCCM input and management -If renal function improves may consider CT angios to rule out PE.  Alternatively could consider VQ scan.  Although her INR is therapeutic  *Acute renal failure: Creatinine 2.3 -Underlying history of lupus nephritis  -Baseline creatinine of 1.4-1.7 -Her nephrologist recommended to avoid diuretics -Continue Plaquenil and prednisone per her nephro/rheumatology -Nephrology following  *Chronic diastolic heart failure/HFpEF -Followed at [facility] in past -Cardiology following here  * Anemia of chronic kidney disease -Followed by Dr. Smith Robert at cancer center -Getting EPO per the recommendation  * Left breast cancer continue letrozole until dec 2020  *Left shoulder pain from recent injury -Continue left shoulder immobilizer -We will obtain x-ray and consider Ortho consult if needed   She looks critically sick and high risk for cardiorespiratory failure and multiorgan failure,  overall poor prognosis. Consider palliative care evaluation comfort care     All the records are reviewed and case discussed with Care Management/Social Worker. Management plans discussed with the patient, intensivist and they are in agreement.  CODE STATUS: Prior  TOTAL TIME TAKING CARE OF THIS PATIENT: 25 minutes.   More than 50% of the time was spent in counseling/coordination of care: YES  POSSIBLE D/C IN 3-4 DAYS, DEPENDING ON CLINICAL CONDITION.   Delfino Lovett M.D on 06/05/2019 at 3:45 PM  Between 7am to 6pm - Pager - [phone]  After 6pm go to [email or web address] - Social research officer, government  [facility]  Office  [phone]  CC: Primary care physician; Sherlene Shams, MD  Note: This dictation was prepared with Dragon dictation along with smaller phrase technology. Any transcriptional errors that result from this process are unintentional.

## 2019-06-05 NOTE — Progress Notes (Signed)
Pt's work of breathing has greatly improved on BiPAP, RR now in 20's, no assessory muscle use, O2 sat's 96%, appears comfortable and resting.   Called and updated pt's son Neasia Fleeman of event and now tolerating BiPAP.  All questions answered.   Darel Hong, AGACNP-BC [place] [facility] Pager: [phone] Cell: [phone]

## 2019-06-05 NOTE — Consult Note (Signed)
Pharmacy Antibiotic Note  Diana [name] is a 84 y.o. female admitted on 06/06/2019 with cellulitis and possible pneumonia. Patient was septic in ED with fever, tachycardia, and hypotension. CXR significant for increasing left perihilar and left bibasilar airspace opacity. Physical exam significant for bilateral lower extremity edema and cellulitis. Found to have AKI on admission with Scr of 2.48 (baseline 1.2). No apparent plan for dialysis at this time. Patient has a history of allergy to amoxicillin (stomach cramps) and cefuroxime (itching). Discussed allergies with patient today and no mention of SOB but only GI upset. History significant for SLE and patient is on immunosuppressants including mycophenolate and prednisone. 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 Pharmacy has been consulted for vancomycin and Unasyn dosing.   Plan: Unasyn 3 g q24h (renally adjusted)  Patient received LD of vancomycin 1g (21.3 mg/kg) in ED. Expecting poor clearance based on renal function. Will defer vancomycin dosing today. Tentatively plan to obtain vancomycin random level tomorrow. Will continue to follow daily Scr. Anticipate renal function will improve with resolution of sepsis and improved hemodynamics.   Height: 5' (152.4 cm) Weight: 103 lb 9.9 oz (47 kg) IBW/kg (Calculated) : 45.5  Temp (24hrs), Avg:99 F (37.2 C), Min:98.1 F (36.7 C), Max:101 F (38.3 C)  Recent Labs  Lab 05/23/2019 1240 06/13/2019 2229 06/05/19 0127  WBC 9.7 13.0* 12.4*  CREATININE 2.48* 2.38* 2.33*  LATICACIDVEN 1.8  --   --     Estimated Creatinine Clearance: 13.4 mL/min (A) (by C-G formula based on SCr of 2.33 mg/dL (H)).    Allergies  Allergen Reactions  . Iodinated Diagnostic Agents Hives    Pt states she broke out in hives years ago, has been premedicated in the past.   . Tramadol Rash       . Amoxicillin Other (See Comments)    Reaction:  Stomach cramps   . Cefuroxime Itching  .  Metrizamide Hives  . Morphine And Related     Reaction: face flushed    . Propoxyphene     Stomach cramp  . Adhesive [Tape] Rash    Silk tape causes red skin  . Cephalexin Rash  . Sulfa Antibiotics Rash  . Sulfonamide Derivatives Rash  . Venlafaxine Rash    Antimicrobials this admission: Unasyn 9/16 >> Vancomycin 9/15 >>  Levofloxacin 9/16 x1 Aztreonam 9/15 x1  Dose adjustments this admission: N/A  Microbiology results: 9/16 BCx: No growth <12 hr. 9/15 BCx: No growth < 24 hr. 9/16 Sputum: pending 9/16 MRSA PCR: negative  Thank you for allowing pharmacy to be a part of this patient's care.  [place] Resident 06/05/2019 11:14 AM

## 2019-06-05 NOTE — Progress Notes (Signed)
Called to bedside by nursing as there is concern that pt aspirated when taking PM medications this evening.  Pt is awake and alert, able to follow commands, but does have moderate respiratory distress (RR 30's) and assessory muscle use.  Have placed orders for CXR, X-ray of neck, Nasotracheal suctioning, Chest PT, and BiPAP.  Pt is oriented and coherent and states that she would like to be a Full Code for now, and would want intubation for a short time to see if she could improve, but she would not want long term intubation.  Continue to monitor.    Darel Hong, AGACNP-BC [place] [facility] Pager: [phone] Cell: [phone]

## 2019-06-05 NOTE — Progress Notes (Signed)
Initial Nutrition Assessment  RD working remotely.  DOCUMENTATION CODES:   Not applicable  INTERVENTION:  Continue Ensure Enlive po BID, each supplement provides 350 kcal and 20 grams of protein.  NUTRITION DIAGNOSIS:   Inadequate oral intake related to decreased appetite as evidenced by per patient/family report.  GOAL:   Patient will meet greater than or equal to 90% of their needs  MONITOR:   PO intake, Supplement acceptance, Labs, Weight trends, Skin, I & O's  REASON FOR ASSESSMENT:   Malnutrition Screening Tool    ASSESSMENT:   84 year old female with PMHx of lupus, hypothyroidism, hiatal hernia, hx DVT, CHF, HTN, HLD, perispheral neuropathy, COPD, A-fib, breast cancer s/p XRT, hx feeding tube placement admitted with sepsis, PNA, cellulitis of bilateral lower extremities, AKI, also with left shoulder fracture.   Called patient's room for nutrition/weight history. Family member answered who reported patient would not be able to talk on the phone. He reported patient has had a decreased appetite and intake for a while PTA but he is unable to provide details. He reports patient will be willing to drink Ensure to help meet calorie/protein needs. He reports patient has not yet had anything to eat today.  He reports patient has lost a significant amount of weight over the past month or so. Per chart weight has actually been stable for the past 3 months. Per chart she was 122 lbs in 05/2018. She is now 47 kg (103.62 lbs). She has lost 19 lbs (15.6% body weight) over the past year, which is not significant for time frame but is still concerning.  Medications reviewed and include: Oscal with D 1 tablet daily, vitamin B12 1000 micrograms daily IM, Cellcept, pantoprazole, prednisone 5 mg daily, Florastor 250 mg daily, vitamin C 1000 mg daily, 1/2NS at 50 mL/hr, human albumin 12.5 grams BID IV, Unasyn, phenylephrine gtt.  Labs reviewed: CBG 67, Sodium 149, Chloride 119, BUN 43,  Creatinine 2.33.   Patient is at risk for malnutrition. Unable to determine if she meets criteria without completing NFPE.  NUTRITION - FOCUSED PHYSICAL EXAM:  Unable to complete at this time.  Diet Order:   Diet Order            Diet regular Room service appropriate? Yes; Fluid consistency: Thin  Diet effective now             EDUCATION NEEDS:   No education needs have been identified at this time  Skin:  Skin Assessment: Skin Integrity Issues:(Stg I buttocks; MSAD to buttocks)  Last BM:  PTA  Height:   Ht Readings from Last 1 Encounters:  05/27/2019 5' (1.524 m)   Weight:   Wt Readings from Last 1 Encounters:  06/17/2019 47 kg   Ideal Body Weight:  45.5 kg  BMI:  Body mass index is 20.24 kg/m.  Estimated Nutritional Needs:   Kcal:  1300-1500  Protein:  65-75 grams  Fluid:  1.3-1.5 L/day  Willey Blade, MS, RD, LDN Office: [phone] Pager: [phone] After Hours/Weekend Pager: [phone]

## 2019-06-05 NOTE — Progress Notes (Addendum)
Progress Note  Patient Name: Diana [name] Date of Encounter: 06/05/2019  Primary Cardiologist: [facility] - Freeman Caldron, MD  Subjective   Generalized malaise this morning.  No chest pain or dyspnea.  She does note occasional capitation/fluttering and did have a brief run of SVT overnight.  Inpatient Medications    Scheduled Meds:  calcium-vitamin D  1 tablet Oral Daily   Chlorhexidine Gluconate Cloth  6 each Topical Q0600   [START ON 06/20/2019] cyanocobalamin  1,000 mcg Intramuscular Q30 days   hydroxychloroquine  200 mg Oral Daily   letrozole  2.5 mg Oral Daily   [START ON 06/06/2019] levofloxacin  500 mg Oral Q48H   levothyroxine  112 mcg Oral Daily   mouth rinse  15 mL Mouth Rinse BID   metoprolol succinate  12.5 mg Oral Daily   mycophenolate  1,000 mg Oral BID   pantoprazole  40 mg Oral BID   predniSONE  5 mg Oral Daily   saccharomyces boulardii  250 mg Oral Daily   tadalafil (PAH)  40 mg Oral Daily   valGANciclovir  450 mg Oral Q48H   vancomycin variable dose per unstable renal function (pharmacist dosing)   Does not apply See admin instructions   vitamin C  1,000 mg Oral Daily   Continuous Infusions:  sodium chloride 50 mL/hr at 06/05/19 0600   sodium chloride 5 mL/hr at 06/05/19 0600   sodium chloride     And   sodium chloride     PRN Meds: sodium chloride, ondansetron, prochlorperazine   Vital Signs    Vitals:   06/05/19 0500 06/05/19 0600 06/05/19 0700 06/05/19 0800  BP: (!) 89/60 (!) 88/54 (!) 93/56 (!) 90/58  Pulse: (!) 111 (!) 105 (!) 103 (!) 103  Resp: 18 18 17 18   Temp:      TempSrc:      SpO2: 98% 99% 99% 98%  Weight:      Height:        Intake/Output Summary (Last 24 hours) at 06/05/2019 0901 Last data filed at 06/05/2019 0600 Gross per 24 hour  Intake 407.68 ml  Output 475 ml  Net -67.32 ml   Filed Weights   06-27-19 1219  Weight: 47 kg    Physical Exam   GEN: Well nourished, well developed, in no acute  distress.  HEENT: Grossly normal.  Neck: Supple, no JVD, carotid bruits, or masses. Cardiac: RRR, tachycardic, no murmurs, rubs, or gallops. No clubbing, cyanosis, edema.  Radials/DP/PT 2+ and equal bilaterally.  Respiratory:  Respirations regular and unlabored, clear to auscultation bilaterally. GI: Soft, nontender, nondistended, BS + x 4. MS: no deformity or atrophy. Skin: warm and dry, no rash. Neuro:  Strength and sensation are intact. Psych: AAOx3.  Flat affect  Labs    Chemistry Recent Labs  Lab 2019/06/27 1240 27-Jun-2019 2229 06/05/19 0127  NA 149* 148* 149*  K 4.5 4.4 4.6  CL 116* 118* 119*  CO2 26 23 23   GLUCOSE 98 95 82  BUN 40* 42* 43*  CREATININE 2.48* 2.38* 2.33*  CALCIUM 8.0* 7.4* 7.5*  PROT 5.9* 4.8*  --   ALBUMIN 1.5* 1.2*  --   AST 38 31  --   ALT 20 16  --   ALKPHOS 149* 138*  --   BILITOT 0.8 0.8  --   GFRNONAA 17* 18* 19*  GFRAA 20* 21* 22*  ANIONGAP 7 7 7      Hematology Recent Labs  Lab 2019-06-27 1240 06/27/2019 2229 06/05/19  0127  WBC 9.7 13.0* 12.4*  RBC 3.59* 3.07* 3.29*  HGB 11.3* 9.6* 10.3*  HCT 34.4* 29.3* 32.3*  MCV 95.8 95.4 98.2  MCH 31.5 31.3 31.3  MCHC 32.8 32.8 31.9  RDW 18.7* 18.6* 18.6*  PLT 288 193 243    Lab Results  Component Value Date   INR 1.8 (H) 06/05/2019   INR 2.0 (H) 05/21/2019   INR 3.0 (A) 05/29/2019     Radiology    Ct Abdomen Pelvis Wo Contrast  Result Date: 06/18/2019 CLINICAL DATA:  Fever EXAM: CT ABDOMEN AND PELVIS WITHOUT CONTRAST TECHNIQUE: Multidetector CT imaging of the abdomen and pelvis was performed following the standard protocol without IV contrast. COMPARISON:  March 10, 2019 FINDINGS: Lower chest: Consolidation the the visualized left lower lobe is. Minimal atelectasis of posterior right base is noted. Hepatobiliary: No focal liver abnormality is seen. No gallstones, gallbladder wall thickening, or biliary dilatation. Pancreas: Unremarkable. No pancreatic ductal dilatation or surrounding  inflammatory changes. Spleen: Status post prior splenectomy. Adrenals/Urinary Tract: The adrenal glands are normal. There are cysts in bilateral kidneys, largest in the pole left kidney unchanged. 1 mm nonobstructing stone is identified in the lower pole right kidney. There is no hydronephrosis bilaterally. The bladder is decompressed with Foley catheter in place. Stomach/Bowel: There is a large hiatal hernia. There is no small bowel obstruction. There is diverticulosis of colon. The appendix is not seen but no inflammation is noted around cecum. There is question soft tissue measuring 3.2 x 1.7 cm in the sigmoid colon with mild generalized sigmoid colonic wall thickening. Vascular/Lymphatic: Aortic atherosclerosis. No enlarged abdominal or pelvic lymph nodes. Reproductive: Uterus and bilateral adnexa are unremarkable. Other: None. Musculoskeletal: Degenerative joint changes of the with scoliosis of spine are identified. Patient status vertebroplasty lumbar vertebral bodies. IMPRESSION: Consolidation with volume loss the visualized left lower lobe. Findings are more likely due to atelectasis but superimposed pneumonia is not excluded. Mild generalized thickening sigmoid colonic wall with a question soft tissue measuring 3.2 x 1 7 cm in the sigmoid colon. Consider further evaluation with barium enema outpatient basis. Electronically Signed   By: Sherian Rein M.D.   On: 06/08/2019 14:38   Ct Head Wo Contrast  Result Date: 06/14/2019 CLINICAL DATA:  Altered level of consciousness.  Recent fall EXAM: CT HEAD WITHOUT CONTRAST TECHNIQUE: Contiguous axial images were obtained from the base of the skull through the vertex without intravenous contrast. COMPARISON:  Feb 12, 2007 FINDINGS: Brain: There is mild diffuse atrophy. There is no intracranial mass, hemorrhage, extra-axial fluid collection, or midline shift. There is patchy small vessel disease in the centra semiovale bilaterally. No acute infarct is demonstrable  on this study. Vascular: There is no hyperdense vessel. There is calcification in each carotid siphon region. Skull: Bony calvarium appears intact. Sinuses/Orbits: There is mucosal thickening in several ethmoid air cells. Other visualized paranasal sinuses are clear. Orbits appear symmetric bilaterally. Other: Mastoids on the right are clear. There is opacification of inferior mastoid air cells on the left. Most of the mastoid air cells on the left are clear. There is a sebaceous cyst containing calcification in the high right parietal vertex scalp region measuring 1.0 x 0.8 cm. IMPRESSION: Atrophy with periventricular small vessel disease. No acute infarct. No mass or hemorrhage. There are foci of arterial vascular calcification. There is mucosal thickening in several ethmoid air cells. There is inferior left mastoid air cell disease. Electronically Signed   By: Bretta Bang III M.D.   On:  05/30/2019 14:29   Dg Chest Portable 1 View  Result Date: 06/11/2019 CLINICAL DATA:  Shortness of breath EXAM: PORTABLE CHEST 1 VIEW COMPARISON:  03/10/2019 FINDINGS: The heart size and mediastinal contours are within normal limits. Calcific aortic knob. Prominent retrocardiac opacity compatible with known large hiatal hernia. Increasing left perihilar and left basilar markings. Lungs hyperexpanded. No large pleural fluid collection. No pneumothorax. IMPRESSION: Increasing left perihilar and left basilar airspace opacity suspicious for developing infection in the appropriate clinical setting. Electronically Signed   By: Duanne Guess M.D.   On: 06/13/2019 13:02   Dg Shoulder Left  Result Date: 05/31/2019 CLINICAL DATA:  84 year old female with left shoulder pain. EXAM: LEFT SHOULDER - 2+ VIEW COMPARISON:  Chest radiograph date 05/28/2019 FINDINGS: There is apparent area cortical irregularity with a faint linear density through the left humeral neck concerning for an age indeterminate nondisplaced fracture,  possibly acute. Clinical correlation is recommended. CT may provide better evaluation. The bones are osteopenic. There is no dislocation. The soft tissues are unremarkable. Left lung densities better described and seen on the prior radiograph. IMPRESSION: Fracture of the left humeral neck. Clinical correlation is recommended. Electronically Signed   By: Elgie Collard M.D.   On: 05/24/2019 16:49    Telemetry    Sinus tachycardia w/ brief runs of SVT (~ 1:30 @ 2:48 this AM) - Personally Reviewed  Cardiac Studies   Echo pending  Patient Profile     84 y.o. female w/ a h/o PE/DVT s/p IVC filter, Afib on coumadin, HFpEF, PAH, severe lymphedema, and COPD, who was admitted 9/15 after presenting w/ mechanical fall (9/14) and L humerus fx, and was found to be hypotensive, febrile (101), septic/PNA, and volume depleted.  Assessment & Plan    1.  Sepsis/pneumonia/lower extremity cellulitis: Temperature of 101 on admission but afebrile today.  WBC 12.4 this morning.  Antibiotic therapy per critical care medicine.  She remains intermittently hypotensive with pressures in the 80s.  She continues on low-dose beta-blocker and tadalafil therapy and I will hold these.  Losartan was placed on hold yesterday.  2.  HFpEF/PAH: Volume stable.  She has been intermittently hypotensive and remains in sinus tachycardia.  Holding beta-blocker and tadalafil in the setting of hypotension.  No significant volume excess.  3.  PAF: In sinus.  Cont coumadin (INR 1.7 this AM).  Holding beta-blocker as above.  4.  H/o PE/DVT: chronic coumadin. INR 1.8 this AM.  5.  Acute on chronic stage V kidney disease/volume depletion: Creatinine improved to 2.33.  Baseline appears to be around 1.75 over the past 6 months or so.  Continue to hold nephrotoxic agents and hydrate.  6.  Abdominal discomfort: Abnormal CT with question of soft tissue mass measuring 3.2 x 1.7 cm in the sigmoid colon.  Further work-up per internal medicine or  as outpatient.  7.  Left humeral fracture: Ortho following with plan for nonsurgical management.  8.  PSVT:  Holding  blocker 2/2 hypotension.  Brief run of svt overnight.  Follow.  Signed, Nicolasa Ducking, NP  06/05/2019, 9:01 AM    Addendum Attending Note Patient seen and examined, agree with detailed note above,   Patient presentation and plan discussed on rounds.     EKG lab work, chest x-ray, echocardiogram reviewed independently by myself  General patient looks weak and tired this morning.  She denies chest pain or palpitations.  Denies fevers.  Physical exam Heart exam showed tachycardia, lung exam clear lungs anteriorly, no edema in  the legs.  A/p.  History of pulmonary hypertension, continue to hold nitrates and beta-blocker in light of low normal blood pressure and sepsis. Continue warfarin for paroxysmal A. fib and history of PE.  ICU team managing possible infection.  Follow-up on echocardiogram.   Signed: Debbe Odea, M.D. [facility]    For questions or updates, please contact   Please consult [email or web address] for contact info under Cardiology/STEMI.

## 2019-06-05 NOTE — Telephone Encounter (Signed)
Pt has been admitted into [facility].

## 2019-06-06 ENCOUNTER — Inpatient Hospital Stay: Payer: Medicare Other

## 2019-06-06 DIAGNOSIS — Z7189 Other specified counseling: Secondary | ICD-10-CM

## 2019-06-06 DIAGNOSIS — R0902 Hypoxemia: Secondary | ICD-10-CM

## 2019-06-06 DIAGNOSIS — Z515 Encounter for palliative care: Secondary | ICD-10-CM

## 2019-06-06 DIAGNOSIS — J9601 Acute respiratory failure with hypoxia: Secondary | ICD-10-CM

## 2019-06-06 DIAGNOSIS — J189 Pneumonia, unspecified organism: Secondary | ICD-10-CM

## 2019-06-06 DIAGNOSIS — T17908A Unspecified foreign body in respiratory tract, part unspecified causing other injury, initial encounter: Secondary | ICD-10-CM

## 2019-06-06 DIAGNOSIS — I959 Hypotension, unspecified: Secondary | ICD-10-CM

## 2019-06-06 LAB — COMPREHENSIVE METABOLIC PANEL
ALT: 16 U/L (ref 0–44)
AST: 35 U/L (ref 15–41)
Albumin: 1.2 g/dL — ABNORMAL LOW (ref 3.5–5.0)
Alkaline Phosphatase: 137 U/L — ABNORMAL HIGH (ref 38–126)
Anion gap: 8 (ref 5–15)
BUN: 37 mg/dL — ABNORMAL HIGH (ref 8–23)
CO2: 19 mmol/L — ABNORMAL LOW (ref 22–32)
Calcium: 7 mg/dL — ABNORMAL LOW (ref 8.9–10.3)
Chloride: 119 mmol/L — ABNORMAL HIGH (ref 98–111)
Creatinine, Ser: 2.32 mg/dL — ABNORMAL HIGH (ref 0.44–1.00)
GFR calc Af Amer: 22 mL/min — ABNORMAL LOW (ref >60)
GFR calc non Af Amer: 19 mL/min — ABNORMAL LOW (ref >60)
Glucose, Bld: 107 mg/dL — ABNORMAL HIGH (ref 70–99)
Potassium: 3.9 mmol/L (ref 3.5–5.1)
Sodium: 146 mmol/L — ABNORMAL HIGH (ref 135–145)
Total Bilirubin: 0.8 mg/dL (ref 0.3–1.2)
Total Protein: 4.2 g/dL — ABNORMAL LOW (ref 6.5–8.1)

## 2019-06-06 LAB — CBC WITH DIFFERENTIAL/PLATELET
Abs Immature Granulocytes: 0.06 10*3/uL (ref 0.00–0.07)
Basophils Absolute: 0.1 10*3/uL (ref 0.0–0.1)
Basophils Relative: 0 %
Eosinophils Absolute: 0.1 10*3/uL (ref 0.0–0.5)
Eosinophils Relative: 1 %
HCT: 27.8 % — ABNORMAL LOW (ref 36.0–46.0)
Hemoglobin: 9.1 g/dL — ABNORMAL LOW (ref 12.0–15.0)
Immature Granulocytes: 1 %
Lymphocytes Relative: 9 %
Lymphs Abs: 1 10*3/uL (ref 0.7–4.0)
MCH: 31.5 pg (ref 26.0–34.0)
MCHC: 32.7 g/dL (ref 30.0–36.0)
MCV: 96.2 fL (ref 80.0–100.0)
Monocytes Absolute: 0.5 10*3/uL (ref 0.1–1.0)
Monocytes Relative: 4 %
Neutro Abs: 9.9 10*3/uL — ABNORMAL HIGH (ref 1.7–7.7)
Neutrophils Relative %: 85 %
Platelets: 220 10*3/uL (ref 150–400)
RBC: 2.89 MIL/uL — ABNORMAL LOW (ref 3.87–5.11)
RDW: 19.4 % — ABNORMAL HIGH (ref 11.5–15.5)
Smear Review: NORMAL
WBC Morphology: INCREASED
WBC: 11.6 10*3/uL — ABNORMAL HIGH (ref 4.0–10.5)
nRBC: 0 % (ref 0.0–0.2)

## 2019-06-06 LAB — BLOOD GAS, ARTERIAL
Acid-base deficit: 5.2 mmol/L — ABNORMAL HIGH (ref 0.0–2.0)
Bicarbonate: 20 mmol/L (ref 20.0–28.0)
FIO2: 40
MECHVT: 400 mL
O2 Saturation: 97.9 %
PEEP: 5 cmH2O
Patient temperature: 37
RATE: 14 resp/min
pCO2 arterial: 37 mmHg (ref 32.0–48.0)
pH, Arterial: 7.34 — ABNORMAL LOW (ref 7.350–7.450)
pO2, Arterial: 108 mmHg (ref 83.0–108.0)

## 2019-06-06 LAB — LACTIC ACID, PLASMA
Lactic Acid, Venous: 1.9 mmol/L (ref 0.5–1.9)
Lactic Acid, Venous: 1.4 mmol/L (ref 0.5–1.9)

## 2019-06-06 LAB — GLUCOSE, CAPILLARY
Glucose-Capillary: 76 mg/dL (ref 70–99)
Glucose-Capillary: 80 mg/dL (ref 70–99)
Glucose-Capillary: 87 mg/dL (ref 70–99)

## 2019-06-06 LAB — CBC
HCT: 27.8 % — ABNORMAL LOW (ref 36.0–46.0)
Hemoglobin: 9.4 g/dL — ABNORMAL LOW (ref 12.0–15.0)
MCH: 32.1 pg (ref 26.0–34.0)
MCHC: 33.8 g/dL (ref 30.0–36.0)
MCV: 94.9 fL (ref 80.0–100.0)
Platelets: 217 10*3/uL (ref 150–400)
RBC: 2.93 MIL/uL — ABNORMAL LOW (ref 3.87–5.11)
RDW: 19.4 % — ABNORMAL HIGH (ref 11.5–15.5)
WBC: 8.1 10*3/uL (ref 4.0–10.5)
nRBC: 0 % (ref 0.0–0.2)

## 2019-06-06 LAB — BASIC METABOLIC PANEL
Anion gap: 9 (ref 5–15)
BUN: 37 mg/dL — ABNORMAL HIGH (ref 8–23)
CO2: 19 mmol/L — ABNORMAL LOW (ref 22–32)
Calcium: 7.4 mg/dL — ABNORMAL LOW (ref 8.9–10.3)
Chloride: 118 mmol/L — ABNORMAL HIGH (ref 98–111)
Creatinine, Ser: 2.21 mg/dL — ABNORMAL HIGH (ref 0.44–1.00)
GFR calc Af Amer: 23 mL/min — ABNORMAL LOW (ref >60)
GFR calc non Af Amer: 20 mL/min — ABNORMAL LOW (ref >60)
Glucose, Bld: 30 mg/dL — CL (ref 70–99)
Potassium: 4.6 mmol/L (ref 3.5–5.1)
Sodium: 146 mmol/L — ABNORMAL HIGH (ref 135–145)

## 2019-06-06 LAB — VANCOMYCIN, RANDOM: Vancomycin Rm: 10

## 2019-06-06 LAB — LEGIONELLA PNEUMOPHILA SEROGP 1 UR AG: L. pneumophila Serogp 1 Ur Ag: NEGATIVE

## 2019-06-06 LAB — PROTIME-INR
INR: 3.1 — ABNORMAL HIGH (ref 0.8–1.2)
Prothrombin Time: 31.6 seconds — ABNORMAL HIGH (ref 11.4–15.2)

## 2019-06-06 LAB — PROCALCITONIN: Procalcitonin: 3.73 ng/mL

## 2019-06-06 MED ORDER — PANTOPRAZOLE SODIUM 40 MG PO PACK
40.0000 mg | PACK | Freq: Two times a day (BID) | ORAL | Status: DC
  Administered 2019-06-06 – 2019-06-07 (×2): 40 mg
  Filled 2019-06-06 (×2): qty 20

## 2019-06-06 MED ORDER — ADULT MULTIVITAMIN LIQUID CH
15.0000 mL | Freq: Every day | ORAL | Status: DC
  Administered 2019-06-06 – 2019-06-07 (×2): 15 mL
  Filled 2019-06-06 (×2): qty 15

## 2019-06-06 MED ORDER — NOREPINEPHRINE 16 MG/250ML-% IV SOLN
0.0000 ug/min | INTRAVENOUS | Status: DC
  Administered 2019-06-06: 2 ug/min via INTRAVENOUS
  Administered 2019-06-06: 30 ug/min via INTRAVENOUS
  Administered 2019-06-07: 7 ug/min via INTRAVENOUS
  Filled 2019-06-06 (×3): qty 250

## 2019-06-06 MED ORDER — FENTANYL CITRATE (PF) 100 MCG/2ML IJ SOLN
100.0000 ug | Freq: Once | INTRAMUSCULAR | Status: AC
  Administered 2019-06-06: 08:00:00 via INTRAVENOUS

## 2019-06-06 MED ORDER — DEXTROSE 50 % IV SOLN
INTRAVENOUS | Status: AC
  Administered 2019-06-06: 08:00:00
  Filled 2019-06-06: qty 50

## 2019-06-06 MED ORDER — STERILE WATER FOR INJECTION IJ SOLN
INTRAMUSCULAR | Status: AC
  Administered 2019-06-06: 08:00:00
  Filled 2019-06-06: qty 10

## 2019-06-06 MED ORDER — VANCOMYCIN HCL IN DEXTROSE 750-5 MG/150ML-% IV SOLN
750.0000 mg | Freq: Once | INTRAVENOUS | Status: AC
  Administered 2019-06-06: 750 mg via INTRAVENOUS
  Filled 2019-06-06 (×2): qty 150

## 2019-06-06 MED ORDER — FENTANYL CITRATE (PF) 100 MCG/2ML IJ SOLN
INTRAMUSCULAR | Status: AC
  Administered 2019-06-06: 08:00:00 via INTRAVENOUS
  Filled 2019-06-06: qty 2

## 2019-06-06 MED ORDER — HYDROCORTISONE NA SUCCINATE PF 100 MG IJ SOLR
50.0000 mg | Freq: Four times a day (QID) | INTRAMUSCULAR | Status: DC
  Administered 2019-06-06 – 2019-06-07 (×4): 50 mg via INTRAVENOUS
  Filled 2019-06-06 (×4): qty 2

## 2019-06-06 MED ORDER — NOREPINEPHRINE 4 MG/250ML-% IV SOLN
0.0000 ug/min | INTRAVENOUS | Status: DC
  Administered 2019-06-06: 08:00:00 15 ug/min via INTRAVENOUS
  Filled 2019-06-06 (×2): qty 250

## 2019-06-06 MED ORDER — NOREPINEPHRINE 4 MG/250ML-% IV SOLN
0.0000 ug/min | INTRAVENOUS | Status: DC
  Administered 2019-06-06: 15 ug/min via INTRAVENOUS
  Filled 2019-06-06: qty 250

## 2019-06-06 MED ORDER — VECURONIUM BROMIDE 10 MG IV SOLR
INTRAVENOUS | Status: AC
  Administered 2019-06-06: 08:00:00 via INTRAVENOUS
  Filled 2019-06-06: qty 10

## 2019-06-06 MED ORDER — VECURONIUM BROMIDE 10 MG IV SOLR
10.0000 mg | Freq: Once | INTRAVENOUS | Status: AC
  Administered 2019-06-06: 08:00:00 via INTRAVENOUS

## 2019-06-06 MED ORDER — VASOPRESSIN 20 UNIT/ML IV SOLN
0.0300 [IU]/min | INTRAVENOUS | Status: DC
  Administered 2019-06-06: 0.03 [IU]/min via INTRAVENOUS
  Filled 2019-06-06 (×2): qty 2

## 2019-06-06 MED ORDER — MIDAZOLAM HCL 2 MG/2ML IJ SOLN
INTRAMUSCULAR | Status: AC
  Administered 2019-06-06: 08:00:00 via INTRAVENOUS
  Filled 2019-06-06: qty 4

## 2019-06-06 MED ORDER — VITAL AF 1.2 CAL PO LIQD
1000.0000 mL | ORAL | Status: DC
  Administered 2019-06-06: 16:00:00 1000 mL

## 2019-06-06 MED ORDER — VITAL HIGH PROTEIN PO LIQD: 1000.0000 mL | ORAL | Status: DC

## 2019-06-06 MED ORDER — MIDAZOLAM HCL 2 MG/2ML IJ SOLN
4.0000 mg | Freq: Once | INTRAMUSCULAR | Status: AC
  Administered 2019-06-06: 08:00:00 via INTRAVENOUS

## 2019-06-06 MED ORDER — NOREPINEPHRINE 4 MG/250ML-% IV SOLN
INTRAVENOUS | Status: AC
  Filled 2019-06-06: qty 250

## 2019-06-06 MED ORDER — FENTANYL 2500MCG IN NS 250ML (10MCG/ML) PREMIX INFUSION
0.0000 ug/h | INTRAVENOUS | Status: DC
  Administered 2019-06-06 – 2019-06-07 (×2): 100 ug/h via INTRAVENOUS
  Filled 2019-06-06 (×2): qty 250

## 2019-06-06 NOTE — Consult Note (Addendum)
Consultation Note Date: 06/06/2019   Patient Name: Diana [name]  DOB: 1935/05/22  MRN: [id number]  Age / Sex: 84 y.o., female  PCP: Sherlene Shams, MD Referring Physician: Delfino Lovett, MD  Reason for Consultation: Establishing goals of care  HPI/Patient Profile: Diana [name]  is a 84 y.o. female with a known history of A. fib on warfarin, CHF, severe lymphedema who recently had a left shoulder injury and placed in immobilizer at local urgent care who is being admitted for suspected sepsis. She is currently intubated.   Clinical Assessment and Goals of Care: Patient is resting in bed on ventilator. Son at bedside. He states she is widowed and divorced. She has 1 son who is deceased. 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   He states his mother has lupus, which has affected her body for years. She has been declining over the past 5 years. She had tube feeds 2/2 a volvus for around a year during this time period. She uses a walker. She has scoliosis and chronic back and hip pain.   He states she was going to the wound care doctor Monday and tripped, fracturing her arm.   Son states he has spoken with the medical team. He does not feel she will improve and asks questions about comfort care. Questions answered. He states he and the medical team have decided to give it a few days to see how she does before 1 way extubation.      SUMMARY OF RECOMMENDATIONS   Few days before 1 way extubation.   Prognosis:   Poor overall  Discharge Planning: To Be Determined      Primary Diagnoses: Present on Admission:  Sepsis (HCC)   I have reviewed the medical record, interviewed the patient and family, and examined the patient. The following aspects are pertinent.  Past Medical History:  Diagnosis Date   Acute glomerulonephritis with other specified pathological lesion in kidney in  disease classified elsewhere(580.81)    Alveolar aeration decreased    Anemia    Arthritis    Atrial fibrillation (HCC)    Breast cancer (HCC) April 07, 202015   positive, radiation   Cancer (HCC)    Breast   CHF (congestive heart failure) (HCC)    Diaphragmatic hernia    DVT (deep venous thrombosis) (HCC)    Dysrhythmia    Environmental allergies    Erosive esophagitis 03/28/2015   esophageal biopsy July 2017 c/w CMV cytopathic changes, negative for H Pylori and dysplasia      Esophageal reflux    Esophagitis, CMV (HCC) 05/26/2015   Diagnosed at [facility] with manometry,  Tube feeds started for enteral nutrtion 05/12/15    Feeding problem    FEEDING TUBE   Hiatal hernia    Hyperlipidemia    Hypertension    Lower extremity edema    Lupus (systemic lupus erythematosus) (HCC)    Neuropathy    Osteopenia    Oxygen deficiency    USES HS   Peripheral neuropathy,  hereditary/idiopathic    Personal history of radiation therapy    Pneumonia    Primary pulmonary hypertension (HCC)    Renal insufficiency    Sciatica    Shingles    Shingles (herpes zoster) polyneuropathy March 2013   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   Shortness of breath dyspnea    Unspecified hypothyroidism    Unspecified menopausal and postmenopausal disorder    Urinary incontinence without sensory awareness    Vitamin B deficiency    Social History   Socioeconomic History   Marital status: Divorced    Spouse name: Not on file   Number of children: Not on file   Years of education: Not on file   Highest education level: Not on file  Occupational History   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) Health 30 years  Social Network engineer strain: Not hard at all   Food insecurity    Worry: Never true    Inability: Never true   Transportation needs    Medical: No    Non-medical: No  Tobacco Use   Smoking status: Former Smoker    Quit date: 05/25/1991     Years since quitting: 28.0   Smokeless tobacco: Never Used  Substance and Sexual Activity   Alcohol use: No    Frequency: Never    Comment: rare   Drug use: No   Sexual activity: Not on file  Lifestyle   Physical activity    Days per week: 0 days    Minutes per session: 0 min   Stress: Not at all  Relationships   Social connections    Talks on phone: Patient refused    Gets together: Patient refused    Attends religious service: Patient refused    Active member of club or organization: Patient refused    Attends meetings of clubs or organizations: Patient refused    Relationship status: Divorced  Other Topics Concern   Not on file  Social History Narrative   Not on file   Family History  Problem Relation Age of Onset   Colon cancer Mother    Cirrhosis Brother        alcoholic   Alcohol abuse Sister    Breast cancer Sister 23   Cancer Brother    Lung cancer Son    Meniere's disease Son    Cirrhosis Brother        nonalcoholic    Scheduled Meds:  calcium-vitamin D  1 tablet Oral Daily   Chlorhexidine Gluconate Cloth  6 each Topical Q0600   [START ON 06/20/2019] cyanocobalamin  1,000 mcg Intramuscular Q30 days   feeding supplement (ENSURE ENLIVE)  237 mL Oral BID BM   feeding supplement (VITAL AF 1.2 CAL)  1,000 mL Per Tube Q24H   hydrocortisone sod succinate (SOLU-CORTEF) inj  50 mg Intravenous Q6H   hydroxychloroquine  200 mg Oral Daily   letrozole  2.5 mg Oral Daily   mouth rinse  15 mL Mouth Rinse BID   multivitamin  15 mL Per Tube Daily   mycophenolate  1,000 mg Oral BID   nystatin  5 mL Oral QID   pantoprazole sodium  40 mg Per Tube BID   saccharomyces boulardii  250 mg Oral Daily   valGANciclovir  450 mg Oral Q48H   vancomycin variable dose per unstable renal function (pharmacist dosing)   Does not apply See admin instructions   vitamin C  1,000 mg Oral Daily   Warfarin -  Pharmacist Dosing Inpatient   Does not apply q1800     Continuous Infusions:  sodium chloride Stopped (06/05/19 1154)   albumin human 12.5 g (06/06/19 1119)   ampicillin-sulbactam (UNASYN) IV 3 g (06/06/19 1459)   fentaNYL infusion INTRAVENOUS 100 mcg/hr (06/06/19 0815)   norepinephrine (LEVOPHED) Adult infusion     norepinephrine     phenylephrine (NEO-SYNEPHRINE) Adult infusion 250 mcg/min (06/06/19 0745)   sodium chloride     And   sodium chloride     PRN Meds:.sodium chloride, ondansetron (ZOFRAN) IV, ondansetron, oxyCODONE-acetaminophen, prochlorperazine Medications Prior to Admission:  Prior to Admission medications   Medication Sig Start Date End Date Taking? Authorizing Provider  Ascorbic Acid (VITAMIN C) 1000 MG tablet Take 1,000 mg by mouth daily.   Yes [provider]  Calcium Carbonate-Vitamin D 600-400 MG-UNIT tablet Take 1 tablet by mouth daily.   Yes [provider]  cyanocobalamin (,VITAMIN B-12,) 1000 MCG/ML injection Inject 1 mL (1,000 mcg total) into the muscle every 30 (thirty) days. Pt uses on the 1st of every month. 12/28/16  Yes Sherlene Shams, MD  fentaNYL (DURAGESIC) 50 MCG/HR Place 1 patch onto the skin every 3 (three) days. 04/30/19  Yes Sherlene Shams, MD  furosemide (LASIX) 20 MG tablet TAKE 1 TABLET DAILY AS NEEDED FOR FLUID RETENTION 04/03/19  Yes Sherlene Shams, MD  hydroxychloroquine (PLAQUENIL) 200 MG tablet Take 200 mg by mouth daily.   Yes [provider]  letrozole (FEMARA) 2.5 MG tablet TAKE 1 TABLET BY MOUTH EVERY DAY 02/23/19  Yes Creig Hines, MD  levothyroxine (SYNTHROID) 112 MCG tablet Take 1 tablet (112 mcg total) by mouth daily. NAME BRAND ONLY SYNTHROID PLEASE  NOTE CURRENT DOSE 04/23/19  Yes Sherlene Shams, MD  losartan (COZAAR) 25 MG tablet TAKE 1 TABLET BY MOUTH EVERYDAY AT BEDTIME 04/12/19  Yes Sherlene Shams, MD  metoCLOPramide (REGLAN) 10 MG tablet TAKE 1 TABLET BY MOUTH 4 TIMES DAILY - BEFORE MEALS AND AT BEDTIME. 06/29/18  Yes Sherlene Shams, MD   metoprolol succinate (TOPROL-XL) 25 MG 24 hr tablet Take 12.5 mg by mouth daily.    Yes [provider]  mycophenolate (CELLCEPT) 500 MG tablet Take 1,000 mg by mouth 2 (two) times daily.   Yes [provider]  ondansetron (ZOFRAN ODT) 4 MG disintegrating tablet Take 1 tablet (4 mg total) by mouth every 8 (eight) hours as needed for nausea or vomiting. 11/29/17  Yes Minna Antis, MD  oxyCODONE-acetaminophen (PERCOCET/ROXICET) 5-325 MG tablet Take 1 tablet by mouth 2 (two) times daily as needed for severe pain. 04/23/19  Yes Sherlene Shams, MD  OXYGEN Inhale 2 mLs into the lungs at bedtime.   Yes [provider]  pantoprazole (PROTONIX) 40 MG tablet Take 40 mg by mouth 2 (two) times daily.   Yes [provider]  Polyvinyl Alcohol-Povidone (REFRESH OP) Place 2 drops into both eyes daily as needed (dry eyes).    Yes [provider]  predniSONE (DELTASONE) 5 MG tablet Take 5 mg by mouth daily.    Yes [provider]  prochlorperazine (COMPAZINE) 5 MG tablet Take 1 tablet (5 mg total) by mouth every 6 (six) hours as needed for nausea or vomiting. 01/10/19  Yes Sherlene Shams, MD  Saccharomyces boulardii (PROBIOTIC) 250 MG CAPS Take 1 tablet by mouth daily. 12/02/17  Yes Mody, Patricia Pesa, MD  Syringe/Needle, Disp, (SYRINGE 3CC/25GX1") 25G X 1" 3 ML MISC Use for b12 injections 12/28/16  Yes Sherlene Shams, MD  Tadalafil, PAH, (ADCIRCA) 20 MG TABS Take 40 mg by mouth daily.   Yes [provider]  valGANciclovir (VALCYTE) 450 MG tablet Take 1 tablet (450 mg total) by mouth every other day. 05/06/19  Yes Comer, Belia Heman, MD  warfarin (COUMADIN) 1 MG tablet 1 MG ALTERNATING WITH 2 MG DAILY Patient taking differently: 1 MG ALTERNATING WITH 2 MG DAILY Monday,wednesday,fri,sun 05/09/19  Yes Sherlene Shams, MD  warfarin (COUMADIN) 2 MG tablet TAKE 1 TABLET BY MOUTH EVERY DAY Patient taking differently: Take 2 mg by mouth daily. Tuesday, Thursday,saturday  05/20/19  Yes Sherlene Shams, MD  ALPRAZolam Prudy Feeler) 0.25 MG tablet Take 1 tablet (0.25 mg total) by mouth 2 (two) times daily as needed for anxiety. Patient not taking: Reported on 05/30/2019 08/09/18   Sherlene Shams, MD   Allergies  Allergen Reactions   Iodinated Diagnostic Agents Hives    Pt states she broke out in hives years ago, has been premedicated in the past.    Tramadol Rash        Amoxicillin Other (See Comments)    Reaction:  Stomach cramps    Cefuroxime Itching   Metrizamide Hives   Morphine And Related     Reaction: face flushed     Propoxyphene     Stomach cramp   Adhesive [Tape] Rash    Silk tape causes red skin   Cephalexin Rash   Sulfa Antibiotics Rash   Sulfonamide Derivatives Rash   Venlafaxine Rash   Review of Systems  Unable to perform ROS   Physical Exam Constitutional:      Comments: On vent  Musculoskeletal:     Right lower leg: Edema present.     Left lower leg: Edema present.     Vital Signs: BP (!) 85/59    Pulse (!) 105    Temp 98.9 F (37.2 C) (Axillary)    Resp 19    Ht 5' (1.524 m)    Wt 47 kg    SpO2 95%    BMI 20.24 kg/m  Pain Scale: CPOT   Pain Score: Asleep   SpO2: SpO2: 95 % O2 Device:SpO2: 95 % O2 Flow Rate: .O2 Flow Rate (L/min): 5 L/min  IO: Intake/output summary:   Intake/Output Summary (Last 24 hours) at 06/06/2019 1534 Last data filed at 06/06/2019 0600 Gross per 24 hour  Intake 233.98 ml  Output 510 ml  Net -276.02 ml    LBM: Last BM Date: (PTA) Baseline Weight: Weight: 47 kg Most recent weight: Weight: 47 kg     Palliative Assessment/Data:     Time In: 2:50 Time Out: 3:40 Time Total: 50 min Greater than 50%  of this time was spent counseling and coordinating care related to the above assessment and plan.  Signed by: Morton Stall, NP   Please contact Palliative Medicine Team phone at [phone] for questions and concerns.  For individual provider: See Loretha Stapler

## 2019-06-06 NOTE — Procedures (Signed)
Arterial Catheter Insertion Procedure Note Diana [name] [id number] Dec 06, 1934  Procedure: Insertion of Arterial Catheter  Indications: Blood pressure monitoring and Frequent blood sampling  Procedure Details Consent: Unable to obtain consent because of emergent medical necessity. Time Out: Verified patient identification, verified procedure, site/side was marked, verified correct patient position, special equipment/implants available, medications/allergies/relevent history reviewed, required imaging and test results available.  Performed  Maximum sterile technique was used including antiseptics, cap, gloves, gown, hand hygiene, mask and sheet. Skin prep: Chlorhexidine; local anesthetic administered 20 gauge catheter was inserted into right femoral artery using the Seldinger technique. ULTRASOUND GUIDANCE USED: YES Evaluation Blood flow good; BP tracing good. Complications: No apparent complications.   Procedure was performed using Ultrasound for direct visualization of cannulization of Right Femoral Artery.    Darel Hong, AGACNP-BC [facility] Pager: [phone] Cell: [phone]    Diana [name] 06/06/2019

## 2019-06-06 NOTE — Progress Notes (Signed)
Pt loss all IV access for approximately 20 minutes before IV team was able to place 2 PIV's.  Pt became very hypotensive as she was without Neo-synephrine during that time.  Attempt made to place Left Femoral CVC emergently due to loss of IV access and increasing pressor requirements.  Able to cannulate left femoral vein, but unable to thread guidewire (coiling), therefore attempt aborted.  Pressure held at the site as pt is on Warfarin.  No bleeding, hematoma, or complications noted.      Darel Hong, AGACNP-BC [facility] Pager: [phone] Cell: [phone]

## 2019-06-06 NOTE — Progress Notes (Signed)
[employer] [facility] [place] at [facility]   PATIENT NAME: Diana [name]    MR#:  [id number]  DATE OF BIRTH:  [birth year]  SUBJECTIVE:  CHIEF COMPLAINT:  No chief complaint on file. intubated earlier today REVIEW OF SYSTEMS:  ROS unable to obtain as she is intubated DRUG ALLERGIES:   Allergies  Allergen Reactions  . Iodinated Diagnostic Agents Hives    Pt states she broke out in hives years ago, has been premedicated in the past.   . Tramadol Rash       . Amoxicillin Other (See Comments)    Reaction:  Stomach cramps   . Cefuroxime Itching  . Metrizamide Hives  . Morphine And Related     Reaction: face flushed    . Propoxyphene     Stomach cramp  . Adhesive [Tape] Rash    Silk tape causes red skin  . Cephalexin Rash  . Sulfa Antibiotics Rash  . Sulfonamide Derivatives Rash  . Venlafaxine Rash   VITALS:  Blood pressure 92/64, pulse (!) 105, temperature 98.9 F (37.2 C), temperature source Axillary, resp. rate 16, height 5' (1.524 m), weight 47 kg, SpO2 (!) 88 %. PHYSICAL EXAMINATION:  Physical Exam Constitutional:      General: She is in acute distress.     Appearance: She is cachectic. She is ill-appearing and toxic-appearing.  HENT:     Head: Normocephalic and atraumatic.     Comments: ETT in place Eyes:     Conjunctiva/sclera: Conjunctivae normal.     Pupils: Pupils are equal, round, and reactive to light.  Neck:     Musculoskeletal: Normal range of motion and neck supple.     Thyroid: No thyromegaly.     Trachea: No tracheal deviation.  Cardiovascular:     Rate and Rhythm: Normal rate and regular rhythm.     Heart sounds: Normal heart sounds.  Pulmonary:     Effort: Accessory muscle usage and respiratory distress present.     Breath sounds: Decreased breath sounds present. No wheezing.  Chest:     Chest wall: No tenderness.  Abdominal:     General: Bowel sounds are normal. There is no distension.     Palpations: Abdomen is soft.   Tenderness: There is no abdominal tenderness.  Musculoskeletal: Normal range of motion.  Skin:    General: Skin is warm and dry.     Findings: No rash.  Neurological:     Cranial Nerves: No cranial nerve deficit.     Comments: Sedated on vent  Psychiatric:     Comments: Sedated on vent    LABORATORY PANEL:  Female CBC Recent Labs  Lab 06/06/19 0840  WBC 11.6*  HGB 9.1*  HCT 27.8*  PLT 220   ------------------------------------------------------------------------------------------------------------------ Chemistries  Recent Labs  Lab 06/06/19 0840  NA 146*  K 3.9  CL 119*  CO2 19*  GLUCOSE 107*  BUN 37*  CREATININE 2.32*  CALCIUM 7.0*  AST 35  ALT 16  ALKPHOS 137*  BILITOT 0.8   RADIOLOGY:  Dg Neck Soft Tissue  Result Date: 06/05/2019 CLINICAL DATA:  Recent aspiration of pills, initial encounter EXAM: NECK SOFT TISSUES - 1+ VIEW COMPARISON:  None. FINDINGS: Examination is significantly limited due to overlying shoulder density. No definitive radiopaque foreign bodies are seen. No specific airway abnormality is noted. IMPRESSION: No acute abnormality noted although the examination is significantly limited by patient positioning. Electronically Signed   By: Alcide Clever M.D.   On: 06/05/2019 23:32  Dg Abd 1 View  Result Date: 06/06/2019 CLINICAL DATA:  Enteric tube placement. EXAM: ABDOMEN - 1 VIEW COMPARISON:  05/08/2018 FINDINGS: Enteric tube is present with tip over the mid to lower mid abdomen likely over the distal stomach. Bowel gas pattern is nonobstructive. Remainder of the exam is unchanged. IMPRESSION: Nonobstructive bowel gas pattern. Enteric tube with tip over the midline lower abdomen likely over the distal stomach. Electronically Signed   By: Elberta Fortis M.D.   On: 06/06/2019 09:06   Dg Chest Port 1 View  Result Date: 06/06/2019 CLINICAL DATA:  Intubation. EXAM: PORTABLE CHEST 1 VIEW COMPARISON:  06/05/2019 FINDINGS: Endotracheal tube has tip 1.6 cm  above the carina. Enteric tube courses into the stomach and off the film as tip is not visualized. Right IJ central venous catheter has tip just below the cavoatrial junction. Lungs are adequately inflated demonstrate mild interval worsening airspace opacification over the central mid lungs and bilateral lung bases likely bilateral effusions with bibasilar atelectasis as well as interstitial edema. Multifocal infection is possible. Remainder the exam is unchanged. IMPRESSION: Interval worsening central airspace opacification over the mid lungs and bibasilar regions. Findings likely due to interstitial edema with bilateral effusions and bibasilar atelectasis. Multifocal infection is possible. Tubes and lines as described. Electronically Signed   By: Elberta Fortis M.D.   On: 06/06/2019 09:05   Dg Chest Port 1 View  Result Date: 06/05/2019 CLINICAL DATA:  Recent pill aspiration, initial encounter EXAM: PORTABLE CHEST 1 VIEW COMPARISON:  06/06/2019 FINDINGS: Cardiac shadow is enlarged. The overall inspiratory effort is poor. Left basilar atelectasis and small effusion are seen. No radiopaque foreign bodies are noted. No definitive bronchial occlusion is noted. No bony abnormality is seen. IMPRESSION: Left basilar atelectasis and small effusion somewhat progressed when compared with the prior exam. Overall poor inspiratory effort. Electronically Signed   By: Alcide Clever M.D.   On: 06/05/2019 23:33   ASSESSMENT AND PLAN:  Patient is a84 y.o.femalewith lupus nephritis, SLE, pulmonary hypertension, history of DVT on Coumadin anemia of chronic kidney disease here is being admitted for sepsis  *Acute hypoxic and Hypercapnic Respiratory Failure - from septic shock and aspiration -Needed intubation earlier  *Septic shock -needing pressors -Present on admission, likely source lung   *Pneumonia -Continue IV antibiotics -Further management per PCCM  *Acute renal failure: Creatinine 2.3 -Underlying  history of lupus nephritis  -Baseline creatinine of 1.4-1.7 -Her nephrologist recommended to avoid diuretics -Continue Plaquenil and prednisone per her nephro/rheumatology -Nephrology following  *Chronic diastolic heart failure/HFpEF -Followed at [facility] in past -Cardiology following here  * Anemia of chronic kidney disease -Followed by Dr. Smith Robert at cancer center -Getting EPO per the recommendation  * Left breast cancer continue letrozole until dec 2020  *Left shoulder pain from recent injury -Continue left shoulder immobilizer -We will obtain x-ray and consider Ortho consult if needed   She looks critically sick and high risk for cardiorespiratory failure and multiorgan failure,  overall poor prognosis. Consider palliative care evaluation comfort care  Patient/family chose full code and care so intubated earlier   All the records are reviewed and case discussed with Care Management/Social Worker. Management plans discussed with the patient, intensivist and they are in agreement.  CODE STATUS: Prior  TOTAL TIME TAKING CARE OF THIS PATIENT: 15 minutes.   More than 50% of the time was spent in counseling/coordination of care: YES  POSSIBLE D/C IN 3-4 DAYS, DEPENDING ON CLINICAL CONDITION.   Delfino Lovett M.D on 06/06/2019 at  1:50 PM  Between 7am to 6pm - Pager - [phone]  After 6pm go to [email or web address] - Social research officer, government  [employer] [facility]  Office  [phone]  CC: Primary care physician; Sherlene Shams, MD  Note: This dictation was prepared with Dragon dictation along with smaller phrase technology. Any transcriptional errors that result from this process are unintentional.

## 2019-06-06 NOTE — Progress Notes (Signed)
Family At bedside, clinical status relayed to family(SON)  Updated and notified of patients medical condition-  Progressive multiorgan failure with very low chance of meaningful recovery.  The Son understands the situation.  He has consented and agreed to DNR status  Family are satisfied with Plan of action and management. All questions answered  Corrin Parker, M.D.  [facility]  Medical Director [place] Director [facility] Cardio-Pulmonary Department

## 2019-06-06 NOTE — Progress Notes (Signed)
CRITICAL CARE NOTE  CC  follow up septic shock  SUBJECTIVE Patient remains critically ill Prognosis is guarded Frail and thin wants to be full code Aspirated meds last night-severe resp distress Placed on biPAP On pressors High risk for intubation   BP (!) 80/49   Pulse (!) 109   Temp 98.9 F (37.2 C) (Axillary)   Resp (!) 24   Ht 5' (1.524 m)   Wt 47 kg   SpO2 98%   BMI 20.24 kg/m    I/O last 3 completed shifts: In: 1728.1 [I.V.:925.3; IV Piggyback:802.8] Out: 1090 [Urine:1090] No intake/output data recorded.  SpO2: 98 % O2 Flow Rate (L/min): 5 L/min   SIGNIFICANT EVENTS 9/15 admitted for septic shock cellulitis 9/16 aspirated on biPAP   REVIEW OF SYSTEMS  PATIENT IS UNABLE TO PROVIDE COMPLETE REVIEW OF SYSTEM S DUE TO SEVERE CRITICAL ILLNESS AND ENCEPHALOPATHY and resp distress     PHYSICAL EXAMINATION:  GENERAL:critically ill appearing, +resp distress HEAD: Normocephalic, atraumatic.  EYES: Pupils equal, round, reactive to light.  No scleral icterus.  MOUTH: Moist mucosal membrane. NECK: Supple.  PULMONARY: +rhonchi, +wheezing CARDIOVASCULAR: S1 and S2. Regular rate and rhythm. No murmurs, rubs, or gallops.  GASTROINTESTINAL: Soft, nontender, -distended. No masses. Positive bowel sounds. No hepatosplenomegaly.  MUSCULOSKELETAL: No swelling, clubbing, or edema.  NEUROLOGIC: obtunded, GCS<8 SKIN:intact,warm,dry  MEDICATIONS: I have reviewed all medications and confirmed regimen as documented   CULTURE RESULTS   Recent Results (from the past 240 hour(s))  Culture, blood (Routine x 2)     Status: None (Preliminary result)   Collection Time: 06-25-2019 12:40 PM   Specimen: BLOOD  Result Value Ref Range Status   Specimen Description BLOOD BLOOD RIGHT ARM  Final   Special Requests   Final    BOTTLES DRAWN AEROBIC AND ANAEROBIC Blood Culture adequate volume   Culture   Final    NO GROWTH < 24 HOURS Performed at [facility], [address], [place], [place] [address]    Report Status PENDING  Incomplete  SARS Coronavirus 2 [facility] order, Performed in [facility] hospital lab) Nasopharyngeal Nasopharyngeal Swab     Status: None   Collection Time: 25-Jun-2019 12:40 PM   Specimen: Nasopharyngeal Swab  Result Value Ref Range Status   SARS Coronavirus 2 NEGATIVE NEGATIVE Final    Comment: (NOTE) If result is NEGATIVE SARS-CoV-2 target nucleic acids are NOT DETECTED. The SARS-CoV-2 RNA is generally detectable in upper and lower  respiratory specimens during the acute phase of infection. The lowest  concentration of SARS-CoV-2 viral copies this assay can detect is 250  copies / mL. A negative result does not preclude SARS-CoV-2 infection  and should not be used as the sole basis for treatment or other  patient management decisions.  A negative result may occur with  improper specimen collection / handling, submission of specimen other  than nasopharyngeal swab, presence of viral mutation(s) within the  areas targeted by this assay, and inadequate number of viral copies  (<250 copies / mL). A negative result must be combined with clinical  observations, patient history, and epidemiological information. If result is POSITIVE SARS-CoV-2 target nucleic acids are DETECTED. The SARS-CoV-2 RNA is generally detectable in upper and lower  respiratory specimens dur ing the acute phase of infection.  Positive  results are indicative of active infection with SARS-CoV-2.  Clinical  correlation with patient history and other diagnostic information is  necessary to determine patient infection status.  Positive results do  not rule out bacterial infection or co-infection with other viruses. If result is PRESUMPTIVE POSTIVE SARS-CoV-2 nucleic acids MAY BE PRESENT.   A presumptive positive result was obtained on the submitted specimen  and confirmed on repeat testing.  While 2019 novel coronavirus  (SARS-CoV-2) nucleic acids may be present  in the submitted sample  additional confirmatory testing may be necessary for epidemiological  and / or clinical management purposes  to differentiate between  SARS-CoV-2 and other Sarbecovirus currently known to infect humans.  If clinically indicated additional testing with an alternate test  methodology [phone]) is advised. The SARS-CoV-2 RNA is generally  detectable in upper and lower respiratory sp ecimens during the acute  phase of infection. The expected result is Negative. Fact Sheet for Patients:  BoilerBrush.com.cy Fact Sheet for Healthcare Providers: https://pope.com/ This test is not yet approved or cleared by the [place] FDA and has been authorized for detection and/or diagnosis of SARS-CoV-2 by FDA under an Emergency Use Authorization (EUA).  This EUA will remain in effect (meaning this test can be used) for the duration of the COVID-19 declaration under Section 564(b)(1) of the Act, 21 U.S.C. section 360bbb-3(b)(1), unless the authorization is terminated or revoked sooner. Performed at [facility], [address] [place], [place] [address]   MRSA PCR Screening     Status: None   Collection Time: 06/05/19 12:09 AM   Specimen: Nasal Mucosa; Nasopharyngeal  Result Value Ref Range Status   MRSA by PCR NEGATIVE NEGATIVE Final    Comment:        The GeneXpert MRSA Assay (FDA approved for NASAL specimens only), is one component of a comprehensive MRSA colonization surveillance program. It is not intended to diagnose MRSA infection nor to guide or monitor treatment for MRSA infections. Performed at [facility], [address] [place], [place] [address]   Culture, blood (x 2)     Status: None (Preliminary result)   Collection Time: 06/05/19  1:03 AM   Specimen: BLOOD  Result Value Ref Range Status   Specimen Description BLOOD BLOOD RIGHT HAND  Final   Special Requests   Final    BOTTLES  DRAWN AEROBIC ONLY Blood Culture results may not be optimal due to an inadequate volume of blood received in culture bottles   Culture   Final    NO GROWTH < 12 HOURS Performed at [facility], [address] [place], [place] [address]    Report Status PENDING  Incomplete  Culture, blood (x 2)     Status: None (Preliminary result)   Collection Time: 06/05/19  1:27 AM   Specimen: BLOOD  Result Value Ref Range Status   Specimen Description BLOOD RIGHT WRIST  Final   Special Requests   Final    BOTTLES DRAWN AEROBIC ONLY Blood Culture results may not be optimal due to an inadequate volume of blood received in culture bottles   Culture   Final    NO GROWTH < 12 HOURS Performed at [facility], [address], [place], [place] [address]    Report Status PENDING  Incomplete          IMAGING    Dg Neck Soft Tissue  Result Date: 06/05/2019 CLINICAL DATA:  Recent aspiration of pills, initial encounter EXAM: NECK SOFT TISSUES - 1+ VIEW COMPARISON:  None. FINDINGS: Examination is significantly limited due to overlying shoulder density. No definitive radiopaque foreign bodies are seen. No specific airway abnormality is noted. IMPRESSION: No acute abnormality  noted although the examination is significantly limited by patient positioning. Electronically Signed   By: Alcide Clever M.D.   On: 06/05/2019 23:32   US Renal  Result Date: 06/05/2019 CLINICAL DATA:  84 year old female with acute renal insufficiency. EXAM: RENAL / URINARY TRACT ULTRASOUND COMPLETE COMPARISON:  CT of the abdomen pelvis dated June 27, 2019 FINDINGS: Right Kidney: Renal measurements: 10.4 x 4.4 x 5.1 cm = volume: 120 mL. There is mild increased renal parenchymal echogenicity. There is a 5 mm upper pole nonobstructing calculus. Smaller additional nonobstructing stone may be present. No hydronephrosis. There is a 1.2 x 1.2 x 1.6 cm lower pole cyst. Left Kidney: Renal measurements: 8.3 x 5.0 x 4.0 cm = volume: 90  mL. Mild increased echogenicity. No hydronephrosis or shadowing stone. There is a 4.1 x 3.0 x 3.0 cm partially exophytic cyst in the interpolar left kidney. Bladder: A Foley catheter is noted. The urinary bladder is distended despite presence of a Foley. The ureteral jets are not visualized. Fluid containing structure superior to the right kidney likely represents the gallbladder. IMPRESSION: 1. Mildly echogenic kidneys likely related to underlying medical renal disease. 2. Nonobstructing right renal stone.  No hydronephrosis. 3. Bilateral renal cysts. Electronically Signed   By: Elgie Collard M.D.   On: 06/05/2019 12:59   Dg Chest Port 1 View  Result Date: 06/05/2019 CLINICAL DATA:  Recent pill aspiration, initial encounter EXAM: PORTABLE CHEST 1 VIEW COMPARISON:  2019-06-27 FINDINGS: Cardiac shadow is enlarged. The overall inspiratory effort is poor. Left basilar atelectasis and small effusion are seen. No radiopaque foreign bodies are noted. No definitive bronchial occlusion is noted. No bony abnormality is seen. IMPRESSION: Left basilar atelectasis and small effusion somewhat progressed when compared with the prior exam. Overall poor inspiratory effort. Electronically Signed   By: Alcide Clever M.D.   On: 06/05/2019 23:33         ASSESSMENT AND PLAN SYNOPSIS   Severe ACUTE Hypoxic and Hypercapnic Respiratory Failure from septic shock and aspiration Plan for emergent intubation   ACUTE KIDNEY INJURY/Renal Failure -follow chem 7 -follow UO -continue Foley Catheter-assess need -Avoid nephrotoxic agents -Recheck creatinine   NEUROLOGY obtunded  SHOCK-SEPSIS/HYPOVOLUMIC/CARDIOGENIC -use vasopressors to keep MAP>65 -follow ABG and LA -follow up cultures -emperic ABX -consider stress dose steroids -aggressive IV fluid resuscitation  CARDIAC ICU monitoring  ID -continue IV abx as prescibed -follow up cultures  GI GI PROPHYLAXIS as indicated  NUTRITIONAL  STATUS DIET-->NPO Constipation protocol as indicated  ENDO - will use ICU hypoglycemic\Hyperglycemia protocol if indicated   ELECTROLYTES -follow labs as needed -replace as needed -pharmacy consultation and following   DVT/GI PRX ordered TRANSFUSIONS AS NEEDED MONITOR FSBS ASSESS the need for LABS as needed   Critical Care Time devoted to patient care services described in this note is 35 minutes.   Overall, patient is critically ill, prognosis is guarded.  Patient with Multiorgan failure and at high risk for cardiac arrest and death.   Palliative care team consulted  Davie Claud Santiago Glad, M.D.  [facility]  Medical Director [facility] Medical Director [facility] Cardio-Pulmonary Department

## 2019-06-06 NOTE — Progress Notes (Signed)
Nutrition Follow-up  DOCUMENTATION CODES:   Not applicable  INTERVENTION:  Initiate Vital AF 1.2 Cal at 10 mL/hr.  If patient tolerating and pressor dose is stable or decreased in AM consider advancing by 15 mL/hr every 8 hours to goal regimen of Vital AF 1.2 Cal at 40 mL/hr (960 mL goal daily volume). Provides 1152 kcal, 72 grams of protein, 778 mL H2O daily.  Provide liquid MVI daily per tube.  Provide minimum free water flush of 30 ml Q4hrs to maintain tube patency.  NUTRITION DIAGNOSIS:   Inadequate oral intake related to decreased appetite as evidenced by per patient/family report.  Ongoing - patient now intubated.  GOAL:   Patient will meet greater than or equal to 90% of their needs  Not met - addressing with initiation of tube feeds.  MONITOR:   PO intake, Supplement acceptance, Labs, Weight trends, Skin, I & O's  REASON FOR ASSESSMENT:   Malnutrition Screening Tool    ASSESSMENT:   84 year old female with PMHx of lupus, hypothyroidism, hiatal hernia, hx DVT, CHF, HTN, HLD, perispheral neuropathy, COPD, A-fib, breast cancer s/p XRT, hx feeding tube placement admitted with sepsis, PNA, cellulitis of bilateral lower extremities, AKI, also with left shoulder fracture.  Last night patient aspirated medications and developed severe respiratory distress requiring BiPAP. This morning patient was intubated. She is now on PRVC mode with FiO2 50% and PEEP 5 cmH2O. Abdomen soft per RN documentation.  Enteral Access: OGT placed 9/17; terminates in distal stomach per abdominal x-ray 9/17; cm marking not yet documented  MAP: 74-100 mmHg  Patient is currently intubated on ventilator support Ve: 5.6 L/min Temp (24hrs), Avg:98.9 F (37.2 C), Min:98.8 F (37.1 C), Max:98.9 F (37.2 C)  Propofol: N/A  Medications reviewed and include: Oscal with D 1 tablet daily, vitamin B12 1000 micrograms every 30 days IM, Solu-Cortef 50 mg Q6hrs IV, Cellcept, pantoprazole, Florastor 250  mg daily, vitamin C 1000 mg daily, human albumin 12.5 grams BID IV, Unasyn, fentanyl gtt, norepinephrine gtt at 15 mcg/min, phenylephrine gtt at 275 mcg/min.  Labs reviewed: Sodium 146, Chloride 119, CO2 19, BUN 37, Creatinine 2.32.  I/O: 615 mL UOP yesterday (0.5 mL/kg/hr)  Discussed with RN and on rounds. Plan is for initiation of tube feeds today.  Diet Order:   Diet Order            Diet NPO time specified  Diet effective now             EDUCATION NEEDS:   No education needs have been identified at this time  Skin:  Skin Assessment: Skin Integrity Issues:(Stg I buttocks; MSAD to buttocks)  Last BM:  PTA  Height:   Ht Readings from Last 1 Encounters:  06/17/2019 5' (1.524 m)   Weight:   Wt Readings from Last 1 Encounters:  06/14/2019 47 kg   Ideal Body Weight:  45.5 kg  BMI:  Body mass index is 20.24 kg/m.  Estimated Nutritional Needs:   Kcal:  1200 (~25 kcal/kg as PSU 2003b came out <1000)  Protein:  65-75 grams  Fluid:  1.3-1.5 L/day  Willey Blade, MS, RD, LDN Office: [phone] Pager: [phone] After Hours/Weekend Pager: [phone]

## 2019-06-06 NOTE — Progress Notes (Signed)
[facility], [place] 06/06/19  Subjective:   09/16 0701 - 09/17 0700 In: 1320.4 [I.V.:667.6; IV Piggyback:652.8] Out: 615 [Urine:615] Patient  Is now intubated concern about aspiration while taking PM meds Initially tried on biPAP but later intubated Vent assisted, Fio2 40%    Objective:  Vital signs in last 24 hours:  Temp:  [98.8 F (37.1 C)-98.9 F (37.2 C)] 98.9 F (37.2 C) (09/17 0100) Pulse Rate:  [105-133] 109 (09/17 0525) Resp:  [14-37] 24 (09/17 0525) BP: (65-100)/(48-66) 80/49 (09/17 0525) SpO2:  [89 %-100 %] 96 % (09/17 0829) FiO2 (%):  [40 %] 40 % (09/17 0829)  Weight change:  Filed Weights   06/01/2019 1219  Weight: 47 kg    Intake/Output:    Intake/Output Summary (Last 24 hours) at 06/06/2019 0102 Last data filed at 06/06/2019 0600 Gross per 24 hour  Intake 1320.4 ml  Output 615 ml  Net 705.4 ml    Physical Exam: General:  Thin, cachectic, elderly, lying in the bed  HEENT   ETT  Neck:  Supple, no masses  Lungs: Vent assisted, coarse breath sounds  Heart::  Regular, tachycardic  Abdomen:  Soft,    Extremities:  2+ dependent pitting edema  Neurologic:  sedated  Skin:  Bilateral LE redness ,   Foley: In place       Basic Metabolic Panel:  Recent Labs  Lab 06/17/2019 1240 05/22/2019 2229 06/05/19 0127 06/06/19 0644 06/06/19 0840  NA 149* 148* 149* 146* 146*  K 4.5 4.4 4.6 4.6 3.9  CL 116* 118* 119* 118* 119*  CO2 26 23 23  19* 19*  GLUCOSE 98 95 82 30* 107*  BUN 40* 42* 43* 37* 37*  CREATININE 2.48* 2.38* 2.33* 2.21* 2.32*  CALCIUM 8.0* 7.4* 7.5* 7.4* 7.0*     CBC: Recent Labs  Lab 06/13/2019 1240 06/18/2019 2229 06/05/19 0127 06/06/19 0644 06/06/19 0840  WBC 9.7 13.0* 12.4* 8.1 11.6*  NEUTROABS 7.1  --   --   --  PENDING  HGB 11.3* 9.6* 10.3* 9.4* 9.1*  HCT 34.4* 29.3* 32.3* 27.8* 27.8*  MCV 95.8 95.4 98.2 94.9 96.2  PLT 288 193 243 217 220     No results found for: HEPBSAG, HEPBSAB,  HEPBIGM    Microbiology:  Recent Results (from the past 240 hour(s))  Culture, blood (Routine x 2)     Status: None (Preliminary result)   Collection Time: 05/21/2019 12:40 PM   Specimen: BLOOD  Result Value Ref Range Status   Specimen Description BLOOD BLOOD RIGHT ARM  Final   Special Requests   Final    BOTTLES DRAWN AEROBIC AND ANAEROBIC Blood Culture adequate volume   Culture   Final    NO GROWTH 2 DAYS Performed at [facility], [address], [place], [place] [address]    Report Status PENDING  Incomplete  SARS Coronavirus 2 [facility] order, Performed in [facility] hospital lab) Nasopharyngeal Nasopharyngeal Swab     Status: None   Collection Time: 06/17/2019 12:40 PM   Specimen: Nasopharyngeal Swab  Result Value Ref Range Status   SARS Coronavirus 2 NEGATIVE NEGATIVE Final    Comment: (NOTE) If result is NEGATIVE SARS-CoV-2 target nucleic acids are NOT DETECTED. The SARS-CoV-2 RNA is generally detectable in upper and lower  respiratory specimens during the acute phase of infection. The lowest  concentration of SARS-CoV-2 viral copies this assay can detect is 250  copies / mL. A negative result does not preclude SARS-CoV-2 infection  and should not be used as the sole basis for treatment or other  patient management decisions.  A negative result may occur with  improper specimen collection / handling, submission of specimen other  than nasopharyngeal swab, presence of viral mutation(s) within the  areas targeted by this assay, and inadequate number of viral copies  (<250 copies / mL). A negative result must be combined with clinical  observations, patient history, and epidemiological information. If result is POSITIVE SARS-CoV-2 target nucleic acids are DETECTED. The SARS-CoV-2 RNA is generally detectable in upper and lower  respiratory specimens dur ing the acute phase of infection.  Positive  results are indicative of active infection with SARS-CoV-2.   Clinical  correlation with patient history and other diagnostic information is  necessary to determine patient infection status.  Positive results do  not rule out bacterial infection or co-infection with other viruses. If result is PRESUMPTIVE POSTIVE SARS-CoV-2 nucleic acids MAY BE PRESENT.   A presumptive positive result was obtained on the submitted specimen  and confirmed on repeat testing.  While 2019 novel coronavirus  (SARS-CoV-2) nucleic acids may be present in the submitted sample  additional confirmatory testing may be necessary for epidemiological  and / or clinical management purposes  to differentiate between  SARS-CoV-2 and other Sarbecovirus currently known to infect humans.  If clinically indicated additional testing with an alternate test  methodology [phone]) is advised. The SARS-CoV-2 RNA is generally  detectable in upper and lower respiratory sp ecimens during the acute  phase of infection. The expected result is Negative. Fact Sheet for Patients:  BoilerBrush.com.cy Fact Sheet for Healthcare Providers: https://pope.com/ This test is not yet approved or cleared by the [place] FDA and has been authorized for detection and/or diagnosis of SARS-CoV-2 by FDA under an Emergency Use Authorization (EUA).  This EUA will remain in effect (meaning this test can be used) for the duration of the COVID-19 declaration under Section 564(b)(1) of the Act, 21 U.S.C. section 360bbb-3(b)(1), unless the authorization is terminated or revoked sooner. Performed at [facility], [address] [place], [place] [address]   MRSA PCR Screening     Status: None   Collection Time: 06/05/19 12:09 AM   Specimen: Nasal Mucosa; Nasopharyngeal  Result Value Ref Range Status   MRSA by PCR NEGATIVE NEGATIVE Final    Comment:        The GeneXpert MRSA Assay (FDA approved for NASAL specimens only), is one component of  a comprehensive MRSA colonization surveillance program. It is not intended to diagnose MRSA infection nor to guide or monitor treatment for MRSA infections. Performed at [facility], [address] [place], [place] [address]   Culture, blood (x 2)     Status: None (Preliminary result)   Collection Time: 06/05/19  1:03 AM   Specimen: BLOOD  Result Value Ref Range Status   Specimen Description BLOOD BLOOD RIGHT HAND  Final   Special Requests   Final    BOTTLES DRAWN AEROBIC ONLY Blood Culture results may not be optimal due to an inadequate volume of blood received in culture bottles   Culture   Final    NO GROWTH 1 DAY Performed at [facility], [address], [place], [place] [address]    Report Status PENDING  Incomplete  Culture, blood (x 2)     Status: None (Preliminary result)   Collection Time: 06/05/19  1:27 AM   Specimen: BLOOD  Result Value Ref Range Status  Specimen Description BLOOD RIGHT WRIST  Final   Special Requests   Final    BOTTLES DRAWN AEROBIC ONLY Blood Culture results may not be optimal due to an inadequate volume of blood received in culture bottles   Culture   Final    NO GROWTH 1 DAY Performed at [facility], [address] [place], [place] [address]    Report Status PENDING  Incomplete    Coagulation Studies: Recent Labs    2019/06/20 1240 06/05/19 0127 06/06/19 0644  LABPROT 22.2* 20.9* 31.6*  INR 2.0* 1.8* 3.1*    Urinalysis: Recent Labs    2019/06/20 1240  COLORURINE YELLOW*  LABSPEC 1.012  PHURINE 5.0  GLUCOSEU NEGATIVE  HGBUR NEGATIVE  BILIRUBINUR NEGATIVE  KETONESUR NEGATIVE  PROTEINUR NEGATIVE  NITRITE NEGATIVE  LEUKOCYTESUR NEGATIVE      Imaging: Ct Abdomen Pelvis Wo Contrast  Result Date: 06/20/2019 CLINICAL DATA:  Fever EXAM: CT ABDOMEN AND PELVIS WITHOUT CONTRAST TECHNIQUE: Multidetector CT imaging of the abdomen and pelvis was performed following the standard protocol without IV  contrast. COMPARISON:  March 10, 2019 FINDINGS: Lower chest: Consolidation the the visualized left lower lobe is. Minimal atelectasis of posterior right base is noted. Hepatobiliary: No focal liver abnormality is seen. No gallstones, gallbladder wall thickening, or biliary dilatation. Pancreas: Unremarkable. No pancreatic ductal dilatation or surrounding inflammatory changes. Spleen: Status post prior splenectomy. Adrenals/Urinary Tract: The adrenal glands are normal. There are cysts in bilateral kidneys, largest in the pole left kidney unchanged. 1 mm nonobstructing stone is identified in the lower pole right kidney. There is no hydronephrosis bilaterally. The bladder is decompressed with Foley catheter in place. Stomach/Bowel: There is a large hiatal hernia. There is no small bowel obstruction. There is diverticulosis of colon. The appendix is not seen but no inflammation is noted around cecum. There is question soft tissue measuring 3.2 x 1.7 cm in the sigmoid colon with mild generalized sigmoid colonic wall thickening. Vascular/Lymphatic: Aortic atherosclerosis. No enlarged abdominal or pelvic lymph nodes. Reproductive: Uterus and bilateral adnexa are unremarkable. Other: None. Musculoskeletal: Degenerative joint changes of the with scoliosis of spine are identified. Patient status vertebroplasty lumbar vertebral bodies. IMPRESSION: Consolidation with volume loss the visualized left lower lobe. Findings are more likely due to atelectasis but superimposed pneumonia is not excluded. Mild generalized thickening sigmoid colonic wall with a question soft tissue measuring 3.2 x 1 7 cm in the sigmoid colon. Consider further evaluation with barium enema outpatient basis. Electronically Signed   By: Sherian Rein M.D.   On: 06/20/19 14:38   Dg Neck Soft Tissue  Result Date: 06/05/2019 CLINICAL DATA:  Recent aspiration of pills, initial encounter EXAM: NECK SOFT TISSUES - 1+ VIEW COMPARISON:  None. FINDINGS:  Examination is significantly limited due to overlying shoulder density. No definitive radiopaque foreign bodies are seen. No specific airway abnormality is noted. IMPRESSION: No acute abnormality noted although the examination is significantly limited by patient positioning. Electronically Signed   By: Alcide Clever M.D.   On: 06/05/2019 23:32   Dg Abd 1 View  Result Date: 06/06/2019 CLINICAL DATA:  Enteric tube placement. EXAM: ABDOMEN - 1 VIEW COMPARISON:  05/08/2018 FINDINGS: Enteric tube is present with tip over the mid to lower mid abdomen likely over the distal stomach. Bowel gas pattern is nonobstructive. Remainder of the exam is unchanged. IMPRESSION: Nonobstructive bowel gas pattern. Enteric tube with tip over the midline lower abdomen likely over the distal stomach. Electronically Signed   By: Elberta Fortis  M.D.   On: 06/06/2019 09:06   Ct Head Wo Contrast  Result Date: 06/17/2019 CLINICAL DATA:  Altered level of consciousness.  Recent fall EXAM: CT HEAD WITHOUT CONTRAST TECHNIQUE: Contiguous axial images were obtained from the base of the skull through the vertex without intravenous contrast. COMPARISON:  Feb 12, 2007 FINDINGS: Brain: There is mild diffuse atrophy. There is no intracranial mass, hemorrhage, extra-axial fluid collection, or midline shift. There is patchy small vessel disease in the centra semiovale bilaterally. No acute infarct is demonstrable on this study. Vascular: There is no hyperdense vessel. There is calcification in each carotid siphon region. Skull: Bony calvarium appears intact. Sinuses/Orbits: There is mucosal thickening in several ethmoid air cells. Other visualized paranasal sinuses are clear. Orbits appear symmetric bilaterally. Other: Mastoids on the right are clear. There is opacification of inferior mastoid air cells on the left. Most of the mastoid air cells on the left are clear. There is a sebaceous cyst containing calcification in the high right parietal vertex  scalp region measuring 1.0 x 0.8 cm. IMPRESSION: Atrophy with periventricular small vessel disease. No acute infarct. No mass or hemorrhage. There are foci of arterial vascular calcification. There is mucosal thickening in several ethmoid air cells. There is inferior left mastoid air cell disease. Electronically Signed   By: Bretta Bang III M.D.   On: 06/13/2019 14:29   US Renal  Result Date: 06/05/2019 CLINICAL DATA:  84 year old female with acute renal insufficiency. EXAM: RENAL / URINARY TRACT ULTRASOUND COMPLETE COMPARISON:  CT of the abdomen pelvis dated 06/19/2019 FINDINGS: Right Kidney: Renal measurements: 10.4 x 4.4 x 5.1 cm = volume: 120 mL. There is mild increased renal parenchymal echogenicity. There is a 5 mm upper pole nonobstructing calculus. Smaller additional nonobstructing stone may be present. No hydronephrosis. There is a 1.2 x 1.2 x 1.6 cm lower pole cyst. Left Kidney: Renal measurements: 8.3 x 5.0 x 4.0 cm = volume: 90 mL. Mild increased echogenicity. No hydronephrosis or shadowing stone. There is a 4.1 x 3.0 x 3.0 cm partially exophytic cyst in the interpolar left kidney. Bladder: A Foley catheter is noted. The urinary bladder is distended despite presence of a Foley. The ureteral jets are not visualized. Fluid containing structure superior to the right kidney likely represents the gallbladder. IMPRESSION: 1. Mildly echogenic kidneys likely related to underlying medical renal disease. 2. Nonobstructing right renal stone.  No hydronephrosis. 3. Bilateral renal cysts. Electronically Signed   By: Elgie Collard M.D.   On: 06/05/2019 12:59   Dg Chest Port 1 View  Result Date: 06/06/2019 CLINICAL DATA:  Intubation. EXAM: PORTABLE CHEST 1 VIEW COMPARISON:  06/05/2019 FINDINGS: Endotracheal tube has tip 1.6 cm above the carina. Enteric tube courses into the stomach and off the film as tip is not visualized. Right IJ central venous catheter has tip just below the cavoatrial junction.  Lungs are adequately inflated demonstrate mild interval worsening airspace opacification over the central mid lungs and bilateral lung bases likely bilateral effusions with bibasilar atelectasis as well as interstitial edema. Multifocal infection is possible. Remainder the exam is unchanged. IMPRESSION: Interval worsening central airspace opacification over the mid lungs and bibasilar regions. Findings likely due to interstitial edema with bilateral effusions and bibasilar atelectasis. Multifocal infection is possible. Tubes and lines as described. Electronically Signed   By: Elberta Fortis M.D.   On: 06/06/2019 09:05   Dg Chest Port 1 View  Result Date: 06/05/2019 CLINICAL DATA:  Recent pill aspiration, initial encounter EXAM: PORTABLE CHEST  1 VIEW COMPARISON:  06/02/2019 FINDINGS: Cardiac shadow is enlarged. The overall inspiratory effort is poor. Left basilar atelectasis and small effusion are seen. No radiopaque foreign bodies are noted. No definitive bronchial occlusion is noted. No bony abnormality is seen. IMPRESSION: Left basilar atelectasis and small effusion somewhat progressed when compared with the prior exam. Overall poor inspiratory effort. Electronically Signed   By: Alcide Clever M.D.   On: 06/05/2019 23:33   Dg Chest Portable 1 View  Result Date: 05/27/2019 CLINICAL DATA:  Shortness of breath EXAM: PORTABLE CHEST 1 VIEW COMPARISON:  03/10/2019 FINDINGS: The heart size and mediastinal contours are within normal limits. Calcific aortic knob. Prominent retrocardiac opacity compatible with known large hiatal hernia. Increasing left perihilar and left basilar markings. Lungs hyperexpanded. No large pleural fluid collection. No pneumothorax. IMPRESSION: Increasing left perihilar and left basilar airspace opacity suspicious for developing infection in the appropriate clinical setting. Electronically Signed   By: Duanne Guess M.D.   On: 06/15/2019 13:02   Dg Shoulder Left  Result Date:  05/21/2019 CLINICAL DATA:  84 year old female with left shoulder pain. EXAM: LEFT SHOULDER - 2+ VIEW COMPARISON:  Chest radiograph date 06/05/2019 FINDINGS: There is apparent area cortical irregularity with a faint linear density through the left humeral neck concerning for an age indeterminate nondisplaced fracture, possibly acute. Clinical correlation is recommended. CT may provide better evaluation. The bones are osteopenic. There is no dislocation. The soft tissues are unremarkable. Left lung densities better described and seen on the prior radiograph. IMPRESSION: Fracture of the left humeral neck. Clinical correlation is recommended. Electronically Signed   By: Elgie Collard M.D.   On: 05/31/2019 16:49     Medications:   . sodium chloride Stopped (06/05/19 1154)  . albumin human 12.5 g (06/05/19 2145)  . ampicillin-sulbactam (UNASYN) IV Stopped (06/05/19 1347)  . fentaNYL infusion INTRAVENOUS 100 mcg/hr (06/06/19 0815)  . norepinephrine    . norepinephrine (LEVOPHED) Adult infusion 15 mcg/min (06/06/19 0829)  . norepinephrine (LEVOPHED) Adult infusion 15 mcg/min (06/06/19 0815)  . phenylephrine (NEO-SYNEPHRINE) Adult infusion 250 mcg/min (06/06/19 0745)  . sodium chloride     And  . sodium chloride     . calcium-vitamin D  1 tablet Oral Daily  . Chlorhexidine Gluconate Cloth  6 each Topical Q0600  . [START ON 06/20/2019] cyanocobalamin  1,000 mcg Intramuscular Q30 days  . feeding supplement (ENSURE ENLIVE)  237 mL Oral BID BM  . hydroxychloroquine  200 mg Oral Daily  . letrozole  2.5 mg Oral Daily  . mouth rinse  15 mL Mouth Rinse BID  . mycophenolate  1,000 mg Oral BID  . nystatin  5 mL Oral QID  . pantoprazole  40 mg Oral BID  . predniSONE  5 mg Oral Daily  . saccharomyces boulardii  250 mg Oral Daily  . valGANciclovir  450 mg Oral Q48H  . vancomycin variable dose per unstable renal function (pharmacist dosing)   Does not apply See admin instructions  . vitamin C  1,000 mg  Oral Daily  . Warfarin - Pharmacist Dosing Inpatient   Does not apply q1800   sodium chloride, ondansetron (ZOFRAN) IV, ondansetron, oxyCODONE-acetaminophen, prochlorperazine  Assessment/ Plan:  84 y.o. female with systemic lupus erythematosus, remote lupus nephritis 1992, sicca symptoms, heart failure with preserved ejection fraction, pulmonary hypertension, history of DVT requiring chronic anticoagulation,, atrial fibrillation, COPD, lymphedema was admitted on 05/22/2019 with cellulitis and sepsis.   #.  Acute kidney injury, urinalysis negative for protein, blood, 0-5  WBC - cause of AKI not clear but may be related to AKI/ATN secondary to sepsis -Creatinine has been increasing since July of this year -Continue to treat sepsis -Consider stress dose steroids for hypotension - urine P/C ratio 0.25  #  Chronic kidney disease st 3.   Baseline creatinine 1.2/GFR 42 from March 13, 2019  #.  Anemia Lab Results  Component Value Date   HGB 9.1 (L) 06/06/2019    -Monitor hemoglobin closely  #.  Generalized edema and hypoalbuminemia - 3rd spacing of fluid - IV albumin supplementation    #Systemic lupus erythematosus with history of lupus nephritis in the past.  Most recent urine protein to creatinine ratio 0.29 from March 06, 2019.  Continue Immunosuppression with mycophenolate, prednisone.   Also on Plaquenil. Patient is followed by [facility] as outpatient  #Left humeral neck fracture -Immobilization of left arm & shoulder as per primary team and orthopedics - evaluated by Dr Joice Lofts  #Thickening of sigmoid colon noted on CT abdomen -Further work-up as outpatient    LOS: 2 Diana [name] 9/17/20209:27 AM  [facility] [place], [place] [phone]  Note: This note was prepared with Dragon dictation. Any transcription errors are unintentional

## 2019-06-06 NOTE — Consult Note (Signed)
[place] for Warfarin Indication: Atrial fibrillation with hx of PE/DVT  Allergies  Allergen Reactions  . Iodinated Diagnostic Agents Hives    Pt states she broke out in hives years ago, has been premedicated in the past.   . Tramadol Rash       . Amoxicillin Other (See Comments)    Reaction:  Stomach cramps   . Cefuroxime Itching  . Metrizamide Hives  . Morphine And Related     Reaction: face flushed    . Propoxyphene     Stomach cramp  . Adhesive [Tape] Rash    Silk tape causes red skin  . Cephalexin Rash  . Sulfa Antibiotics Rash  . Sulfonamide Derivatives Rash  . Venlafaxine Rash    Patient Measurements: Height: 5' (152.4 cm) Weight: 103 lb 9.9 oz (47 kg) IBW/kg (Calculated) : 45.5  Vital Signs: Temp: 98.9 F (37.2 C) (09/17 0100) Temp Source: Axillary (09/17 0100) BP: 92/64 (09/17 0915) Pulse Rate: 105 (09/17 0915)  Labs: Recent Labs    06/11/2019 1240  06/05/19 0127 06/06/19 0644 06/06/19 0840  HGB 11.3*   < > 10.3* 9.4* 9.1*  HCT 34.4*   < > 32.3* 27.8* 27.8*  PLT 288   < > 243 217 220  APTT  --   --  28  --   --   LABPROT 22.2*  --  20.9* 31.6*  --   INR 2.0*  --  1.8* 3.1*  --   CREATININE 2.48*   < > 2.33* 2.21* 2.32*   < > = values in this interval not displayed.    Estimated Creatinine Clearance: 13.4 mL/min (A) (by C-G formula based on SCr of 2.32 mg/dL (H)).    Medications:  Per patient home regimen is: Warfarin 1 mg MoWeFr and 2 mg on all other days  Assessment: 84 y/o F with atrial fibrillation with hx of DVT/PE on warfarin. Patient was admitted with sepsis secondary to cellulitis/PNA. No administrations of warfarin since admission. Last dose outpatient is listed as "past week" per medication reconciliation on admission. Drug interactions include antibiotics. LFTs on admission WNL. Dose was missed on 9/15 so warfarin 2 mg was given yesterday. INR = 3.1. Patient intubated last night and is now NPO.  Patient has an OG tube.   Goal of Therapy:  INR 2-3   Plan:  Will defer warfarin dose today due to supratherapeutic INR. Continue to monitor daily INR and CBC.   Donaldo Teegarden Dear Nicholes Mango, pharmacy student 06/06/2019,11:59 AM

## 2019-06-06 NOTE — Consult Note (Signed)
Pharmacy Antibiotic Note  Diana [name] is a 84 y.o. female admitted on 05/27/2019 with cellulitis and possible pneumonia. Patient was septic in ED with fever, tachycardia, and hypotension. CXR significant for increasing left perihilar and left bibasilar airspace opacity. Physical exam significant for bilateral lower extremity edema and cellulitis. Found to have AKI on admission with Scr of 2.48 (baseline 1.2). No apparent plan for dialysis at this time. Patient has a history of allergy to amoxicillin (stomach cramps) and cefuroxime (itching). Discussed allergies with patient today and no mention of SOB but only GI upset. History significant for SLE and patient is on immunosuppressants including mycophenolate and prednisone. 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 Pharmacy has been consulted for vancomycin and Unasyn dosing.   Renal function stable with Scr 2.2-2.3 (baseline 1.2). WBC trending up today, no left shift on differential. Patient has been afebrile last 24h.   Plan: Unasyn 3 g q24h (renally adjusted)  0917 @ 1421 VR 10. Will give vancomycin 750 mg IV x1. Continue to monitor renal function and nephrology plan. Plan to order vancomycin level in 2 days. Plan subject to change based on renal function.   Height: 5' (152.4 cm) Weight: 103 lb 9.9 oz (47 kg) IBW/kg (Calculated) : 45.5  Temp (24hrs), Avg:98.9 F (37.2 C), Min:98.8 F (37.1 C), Max:98.9 F (37.2 C)  Recent Labs  Lab 05/29/2019 1240 06/16/2019 2229 06/05/19 0127 06/06/19 0644 06/06/19 0840  WBC 9.7 13.0* 12.4* 8.1 11.6*  CREATININE 2.48* 2.38* 2.33* 2.21* 2.32*  LATICACIDVEN 1.8  --   --   --  1.9    Estimated Creatinine Clearance: 13.4 mL/min (A) (by C-G formula based on SCr of 2.32 mg/dL (H)).    Allergies  Allergen Reactions  . Iodinated Diagnostic Agents Hives    Pt states she broke out in hives years ago, has been premedicated in the past.   . Tramadol Rash       . Amoxicillin  Other (See Comments)    Reaction:  Stomach cramps   . Cefuroxime Itching  . Metrizamide Hives  . Morphine And Related     Reaction: face flushed    . Propoxyphene     Stomach cramp  . Adhesive [Tape] Rash    Silk tape causes red skin  . Cephalexin Rash  . Sulfa Antibiotics Rash  . Sulfonamide Derivatives Rash  . Venlafaxine Rash    Antimicrobials this admission: Unasyn 9/16 >> Vancomycin 9/15 >>  Levofloxacin 9/16 x1 Aztreonam 9/15 x1  Dose adjustments this admission: N/A  Microbiology results: 9/16 BCx: No growth <24 hr. 9/15 BCx: No growth < 48 hr. 9/16 Sputum: pending 9/16 MRSA PCR: negative  Thank you for allowing pharmacy to be a part of this patient's care.  [place] Resident 06/06/2019 11:54 AM

## 2019-06-06 NOTE — Progress Notes (Addendum)
SLP Cancellation Note  Patient Details Name: Diana [name] MRN: [id number] DOB: 01-Apr-1935   Cancelled treatment:       Reason Eval/Treat Not Completed: Patient not medically ready;Medical issues which prohibited therapy(chart reviewed). Pt was orally intubated earlier this morning d/t decline in Pulmonary status. ST services will sign off at this time but be available for re-consult when appropriate.  Of note, pt appears to have Esophageal issues baseline w/ a Large Hiatal Hernia, h/o Esophageal Stenosis, Esophagitis per multiple Upper Endoscopy procedures since 2017. Any Esophageal dysmotility can increase risk for retrograde activity thus impact on Pulmonary status if aspiration occurs. MD updated.      Orinda Kenner, MS, CCC-SLP Watson,Katherine 06/06/2019, 11:52 AM

## 2019-06-06 NOTE — Progress Notes (Signed)
Pt had a hard time swallowing medicines. Medicines given with nectar thick, requested NP for a speech consult and diet changed. Pt sounds congested and she's having a hard time coughing up sputum. Will continue to monitor.

## 2019-06-06 NOTE — Progress Notes (Signed)
Please note, patient is currently followed by outpatient Palliative at home. CSW Dayton Scrape made aware. Flo Shanks BSN, RN, [place] [phone]

## 2019-06-06 NOTE — Progress Notes (Signed)
Progress Note  Patient Name: Diana [name] Date of Encounter: 06/06/2019  Primary Cardiologist: [facility]  Subjective   Events overnight noted.  Patient with progressive respiratory failure following presumed aspiration, complicated by hypotension requiring vasopressors and subsequently intubation this morning.  Inpatient Medications    Scheduled Meds:  calcium-vitamin D  1 tablet Oral Daily   Chlorhexidine Gluconate Cloth  6 each Topical Q0600   [START ON 06/20/2019] cyanocobalamin  1,000 mcg Intramuscular Q30 days   feeding supplement (ENSURE ENLIVE)  237 mL Oral BID BM   hydrocortisone sod succinate (SOLU-CORTEF) inj  50 mg Intravenous Q6H   hydroxychloroquine  200 mg Oral Daily   letrozole  2.5 mg Oral Daily   mouth rinse  15 mL Mouth Rinse BID   mycophenolate  1,000 mg Oral BID   nystatin  5 mL Oral QID   pantoprazole  40 mg Oral BID   saccharomyces boulardii  250 mg Oral Daily   valGANciclovir  450 mg Oral Q48H   vancomycin variable dose per unstable renal function (pharmacist dosing)   Does not apply See admin instructions   vitamin C  1,000 mg Oral Daily   Warfarin - Pharmacist Dosing Inpatient   Does not apply q1800   Continuous Infusions:  sodium chloride Stopped (06/05/19 1154)   albumin human 12.5 g (06/06/19 1119)   ampicillin-sulbactam (UNASYN) IV Stopped (06/05/19 1347)   fentaNYL infusion INTRAVENOUS 100 mcg/hr (06/06/19 0815)   norepinephrine     norepinephrine (LEVOPHED) Adult infusion 15 mcg/min (06/06/19 0829)   norepinephrine (LEVOPHED) Adult infusion 15 mcg/min (06/06/19 0815)   phenylephrine (NEO-SYNEPHRINE) Adult infusion 250 mcg/min (06/06/19 0745)   sodium chloride     And   sodium chloride     PRN Meds: sodium chloride, ondansetron (ZOFRAN) IV, ondansetron, oxyCODONE-acetaminophen, prochlorperazine   Vital Signs    Vitals:   06/06/19 0830 06/06/19 0845 06/06/19 0900 06/06/19 0915  BP: (!) 155/76 (!) 142/76 134/85  92/64  Pulse: 63 77 95 (!) 105  Resp: 16 16 16 16   Temp:      TempSrc:      SpO2: 95% 93% 93% (!) 88%  Weight:      Height:        Intake/Output Summary (Last 24 hours) at 06/06/2019 1148 Last data filed at 06/06/2019 0600 Gross per 24 hour  Intake 1320.4 ml  Output 615 ml  Net 705.4 ml   Filed Weights   06/17/2019 1219  Weight: 47 kg    Physical Exam   GEN: Intubated and sedated HEENT: Grossly normal.  Neck: Supple, no JVD, carotid bruits, or masses. Cardiac: RRR, no murmurs, rubs, or gallops. No clubbing, cyanosis, edema.  Radials/DP/PT 2+ and equal bilaterally.  Respiratory:  Respirations regular and unlabored, coarse/ventilated breath sounds. GI: Soft, nontender, nondistended, BS + x 4. MS: no deformity or atrophy. Skin: warm and dry, no rash. Neuro: Unresponsive in the setting of sedation. Psych: Intubated and sedated.  Labs    Chemistry Recent Labs  Lab 05/30/2019 1240 06/13/2019 2229 06/05/19 0127 06/06/19 0644 06/06/19 0840  NA 149* 148* 149* 146* 146*  K 4.5 4.4 4.6 4.6 3.9  CL 116* 118* 119* 118* 119*  CO2 26 23 23  19* 19*  GLUCOSE 98 95 82 30* 107*  BUN 40* 42* 43* 37* 37*  CREATININE 2.48* 2.38* 2.33* 2.21* 2.32*  CALCIUM 8.0* 7.4* 7.5* 7.4* 7.0*  PROT 5.9* 4.8*  --   --  4.2*  ALBUMIN 1.5* 1.2*  --   --  1.2*  AST 38 31  --   --  35  ALT 20 16  --   --  16  ALKPHOS 149* 138*  --   --  137*  BILITOT 0.8 0.8  --   --  0.8  GFRNONAA 17* 18* 19* 20* 19*  GFRAA 20* 21* 22* 23* 22*  ANIONGAP 7 7 7 9 8      Hematology Recent Labs  Lab 06/05/19 0127 06/06/19 0644 06/06/19 0840  WBC 12.4* 8.1 11.6*  RBC 3.29* 2.93* 2.89*  HGB 10.3* 9.4* 9.1*  HCT 32.3* 27.8* 27.8*  MCV 98.2 94.9 96.2  MCH 31.3 32.1 31.5  MCHC 31.9 33.8 32.7  RDW 18.6* 19.4* 19.4*  PLT 243 217 220   Lab Results  Component Value Date   INR 3.1 (H) 06/06/2019   INR 1.8 (H) 06/05/2019   INR 2.0 (H) 06/10/2019     Radiology    Ct Abdomen Pelvis Wo Contrast  Result Date:  05/26/2019 CLINICAL DATA:  Fever EXAM: CT ABDOMEN AND PELVIS WITHOUT CONTRAST TECHNIQUE: Multidetector CT imaging of the abdomen and pelvis was performed following the standard protocol without IV contrast. COMPARISON:  March 10, 2019 FINDINGS: Lower chest: Consolidation the the visualized left lower lobe is. Minimal atelectasis of posterior right base is noted. Hepatobiliary: No focal liver abnormality is seen. No gallstones, gallbladder wall thickening, or biliary dilatation. Pancreas: Unremarkable. No pancreatic ductal dilatation or surrounding inflammatory changes. Spleen: Status post prior splenectomy. Adrenals/Urinary Tract: The adrenal glands are normal. There are cysts in bilateral kidneys, largest in the pole left kidney unchanged. 1 mm nonobstructing stone is identified in the lower pole right kidney. There is no hydronephrosis bilaterally. The bladder is decompressed with Foley catheter in place. Stomach/Bowel: There is a large hiatal hernia. There is no small bowel obstruction. There is diverticulosis of colon. The appendix is not seen but no inflammation is noted around cecum. There is question soft tissue measuring 3.2 x 1.7 cm in the sigmoid colon with mild generalized sigmoid colonic wall thickening. Vascular/Lymphatic: Aortic atherosclerosis. No enlarged abdominal or pelvic lymph nodes. Reproductive: Uterus and bilateral adnexa are unremarkable. Other: None. Musculoskeletal: Degenerative joint changes of the with scoliosis of spine are identified. Patient status vertebroplasty lumbar vertebral bodies. IMPRESSION: Consolidation with volume loss the visualized left lower lobe. Findings are more likely due to atelectasis but superimposed pneumonia is not excluded. Mild generalized thickening sigmoid colonic wall with a question soft tissue measuring 3.2 x 1 7 cm in the sigmoid colon. Consider further evaluation with barium enema outpatient basis. Electronically Signed   By: Sherian Rein M.D.   On:  06/15/2019 14:38   Dg Neck Soft Tissue  Result Date: 06/05/2019 CLINICAL DATA:  Recent aspiration of pills, initial encounter EXAM: NECK SOFT TISSUES - 1+ VIEW COMPARISON:  None. FINDINGS: Examination is significantly limited due to overlying shoulder density. No definitive radiopaque foreign bodies are seen. No specific airway abnormality is noted. IMPRESSION: No acute abnormality noted although the examination is significantly limited by patient positioning. Electronically Signed   By: Alcide Clever M.D.   On: 06/05/2019 23:32   Dg Abd 1 View  Result Date: 06/06/2019 CLINICAL DATA:  Enteric tube placement. EXAM: ABDOMEN - 1 VIEW COMPARISON:  05/08/2018 FINDINGS: Enteric tube is present with tip over the mid to lower mid abdomen likely over the distal stomach. Bowel gas pattern is nonobstructive. Remainder of the exam is unchanged. IMPRESSION: Nonobstructive bowel gas pattern. Enteric tube with tip over the midline  lower abdomen likely over the distal stomach. Electronically Signed   By: Elberta Fortis M.D.   On: 06/06/2019 09:06   Ct Head Wo Contrast  Result Date: 2019-06-15 CLINICAL DATA:  Altered level of consciousness.  Recent fall EXAM: CT HEAD WITHOUT CONTRAST TECHNIQUE: Contiguous axial images were obtained from the base of the skull through the vertex without intravenous contrast. COMPARISON:  Feb 12, 2007 FINDINGS: Brain: There is mild diffuse atrophy. There is no intracranial mass, hemorrhage, extra-axial fluid collection, or midline shift. There is patchy small vessel disease in the centra semiovale bilaterally. No acute infarct is demonstrable on this study. Vascular: There is no hyperdense vessel. There is calcification in each carotid siphon region. Skull: Bony calvarium appears intact. Sinuses/Orbits: There is mucosal thickening in several ethmoid air cells. Other visualized paranasal sinuses are clear. Orbits appear symmetric bilaterally. Other: Mastoids on the right are clear. There is  opacification of inferior mastoid air cells on the left. Most of the mastoid air cells on the left are clear. There is a sebaceous cyst containing calcification in the high right parietal vertex scalp region measuring 1.0 x 0.8 cm. IMPRESSION: Atrophy with periventricular small vessel disease. No acute infarct. No mass or hemorrhage. There are foci of arterial vascular calcification. There is mucosal thickening in several ethmoid air cells. There is inferior left mastoid air cell disease. Electronically Signed   By: Bretta Bang III M.D.   On: June 15, 2019 14:29   US Renal  Result Date: 06/05/2019 CLINICAL DATA:  84 year old female with acute renal insufficiency. EXAM: RENAL / URINARY TRACT ULTRASOUND COMPLETE COMPARISON:  CT of the abdomen pelvis dated 06-15-2019 FINDINGS: Right Kidney: Renal measurements: 10.4 x 4.4 x 5.1 cm = volume: 120 mL. There is mild increased renal parenchymal echogenicity. There is a 5 mm upper pole nonobstructing calculus. Smaller additional nonobstructing stone may be present. No hydronephrosis. There is a 1.2 x 1.2 x 1.6 cm lower pole cyst. Left Kidney: Renal measurements: 8.3 x 5.0 x 4.0 cm = volume: 90 mL. Mild increased echogenicity. No hydronephrosis or shadowing stone. There is a 4.1 x 3.0 x 3.0 cm partially exophytic cyst in the interpolar left kidney. Bladder: A Foley catheter is noted. The urinary bladder is distended despite presence of a Foley. The ureteral jets are not visualized. Fluid containing structure superior to the right kidney likely represents the gallbladder. IMPRESSION: 1. Mildly echogenic kidneys likely related to underlying medical renal disease. 2. Nonobstructing right renal stone.  No hydronephrosis. 3. Bilateral renal cysts. Electronically Signed   By: Elgie Collard M.D.   On: 06/05/2019 12:59   Dg Chest Port 1 View  Result Date: 06/06/2019 CLINICAL DATA:  Intubation. EXAM: PORTABLE CHEST 1 VIEW COMPARISON:  06/05/2019 FINDINGS: Endotracheal  tube has tip 1.6 cm above the carina. Enteric tube courses into the stomach and off the film as tip is not visualized. Right IJ central venous catheter has tip just below the cavoatrial junction. Lungs are adequately inflated demonstrate mild interval worsening airspace opacification over the central mid lungs and bilateral lung bases likely bilateral effusions with bibasilar atelectasis as well as interstitial edema. Multifocal infection is possible. Remainder the exam is unchanged. IMPRESSION: Interval worsening central airspace opacification over the mid lungs and bibasilar regions. Findings likely due to interstitial edema with bilateral effusions and bibasilar atelectasis. Multifocal infection is possible. Tubes and lines as described. Electronically Signed   By: Elberta Fortis M.D.   On: 06/06/2019 09:05   Dg Chest [facility]  Result Date: 06/05/2019 CLINICAL DATA:  Recent pill aspiration, initial encounter EXAM: PORTABLE CHEST 1 VIEW COMPARISON:  05/28/2019 FINDINGS: Cardiac shadow is enlarged. The overall inspiratory effort is poor. Left basilar atelectasis and small effusion are seen. No radiopaque foreign bodies are noted. No definitive bronchial occlusion is noted. No bony abnormality is seen. IMPRESSION: Left basilar atelectasis and small effusion somewhat progressed when compared with the prior exam. Overall poor inspiratory effort. Electronically Signed   By: Alcide Clever M.D.   On: 06/05/2019 23:33   Dg Chest Portable 1 View  Result Date: 06/18/2019 CLINICAL DATA:  Shortness of breath EXAM: PORTABLE CHEST 1 VIEW COMPARISON:  03/10/2019 FINDINGS: The heart size and mediastinal contours are within normal limits. Calcific aortic knob. Prominent retrocardiac opacity compatible with known large hiatal hernia. Increasing left perihilar and left basilar markings. Lungs hyperexpanded. No large pleural fluid collection. No pneumothorax. IMPRESSION: Increasing left perihilar and left basilar airspace  opacity suspicious for developing infection in the appropriate clinical setting. Electronically Signed   By: Duanne Guess M.D.   On: 06/02/2019 13:02   Dg Shoulder Left  Result Date: 05/30/2019 CLINICAL DATA:  84 year old female with left shoulder pain. EXAM: LEFT SHOULDER - 2+ VIEW COMPARISON:  Chest radiograph date 06/05/2019 FINDINGS: There is apparent area cortical irregularity with a faint linear density through the left humeral neck concerning for an age indeterminate nondisplaced fracture, possibly acute. Clinical correlation is recommended. CT may provide better evaluation. The bones are osteopenic. There is no dislocation. The soft tissues are unremarkable. Left lung densities better described and seen on the prior radiograph. IMPRESSION: Fracture of the left humeral neck. Clinical correlation is recommended. Electronically Signed   By: Elgie Collard M.D.   On: 06/11/2019 16:49    Telemetry    rsr - Personally Reviewed  Cardiac Studies   2D Echocardiogram 9.16.2020  IMPRESSIONS      1. The left ventricle has normal systolic function with an ejection fraction of 60-65%. The cavity size was normal. Left ventricular diastolic function could not be evaluated.  2. Left atrial size was normal.  3. Right atrial size was not assessed.  4. The aortic valve is grossly normal. Aortic valve regurgitation is trivial by color flow Doppler.  5. The pulmonic valve was not well visualized. Pulmonic valve regurgitation is not visualized by color flow Doppler.  6. The aorta is normal unless otherwise noted.  7. The interatrial septum was not assessed.   Patient Profile     84 y.o. female w/ a h/o PE/DVT s/p IVC filter, Afib on coumadin, HFpEF, PAH, severe lymphedema, and COPD, who was admitted 9/15 after presenting w/ mechanical fall (9/14) and L humerus fx, and was found to be hypotensive, febrile (101), septic/PNA, and volume depleted.  Assessment & Plan    1.  Acute respiratory  failure: Status post intubation this morning.  Management per critical care medicine.  2.  Sepsis/pneumonia/lower extremity cellulitis: Antibiotics per critical care medicine.  3.  HFpEF/pulmonary arterial hypertension: Volume stable.  She remains off of beta-blocker anti-Bellafill in the setting of hypotension requiring vasopressor therapy.  4.  Paroxysmal atrial fibrillation: Maintaining sinus rhythm.  Beta-blocker on hold in the setting of hypotension.  INR was 3.1 this morning.  5.  History of PE/DVT: Chronic Coumadin with INR 3.1 this morning.  If she requires prolonged intubation, would recommend heparin/Lovenox bridging.  6.  Acute on chronic stage V kidney disease/volume depletion: Renal function relatively stable.  7.  Abdominal discomfort: Abnormal CT  with question of soft tissue mass measuring 3.2 x 1.7 cm in the sigmoid colon.  Further work-up per internal medicine or as outpatient.  8.  Left humeral fracture: Ortho following with plan for nonsurgical management.  9.  PSVT: Beta-blocker on hold in the setting of hypotension.  Signed, Nicolasa Ducking, NP  06/06/2019, 11:48 AM    For questions or updates, please contact   Please consult [email or web address] for contact info under Cardiology/STEMI.

## 2019-06-06 NOTE — Procedures (Signed)
Endotracheal Intubation: Patient required placement of an artificial airway secondary to Respiratory Failure  Consent: Emergent.   Hand washing performed prior to starting the procedure.   Medications administered for sedation prior to procedure:  Midazolam 4 mg IV,  Vecuronium 10 mg IV, Fentanyl 100 mcg IV.    A time out procedure was called and correct patient, name, & ID confirmed. Needed supplies and equipment were assembled and checked to include ETT, 10 ml syringe, Glidescope, Mac and Miller blades, suction, oxygen and bag mask valve, end tidal CO2 monitor.   Patient was positioned to align the mouth and pharynx to facilitate visualization of the glottis.   Heart rate, SpO2 and blood pressure was continuously monitored during the procedure. Pre-oxygenation was conducted prior to intubation and endotracheal tube was placed through the vocal cords into the trachea.     The artificial airway was placed under direct visualization via glidescope route using a 8.0 ETT on the first attempt.  ETT was secured at 23 cm mark.  Placement was confirmed by auscuitation of lungs with good breath sounds bilaterally and no stomach sounds.  Condensation was noted on endotracheal tube.   Pulse ox 98%.  CO2 detector in place with appropriate color change.   Complications: None .   Operator: Arrow Emmerich.   Chest radiograph ordered and pending.   Comments: OGT placed via glidescope.  Zaven Klemens David Nellene Courtois, M.D.  [facility]  Medical Director ICU-[facility] [place] Medical Director [facility] Cardio-Pulmonary Department       

## 2019-06-06 NOTE — Procedures (Signed)
Central Venous Catheter Placement:TRIPLE LUMEN Indication: Patient receiving vesicant or irritant drug.; Patient receiving intravenous therapy for longer than 5 days.; Patient has limited or no vascular access.   Consent:emergent    Hand washing performed prior to starting the procedure.   Procedure:   An active timeout was performed and correct patient, name, & ID confirmed.   Patient was positioned correctly for central venous access.  Patient was prepped using strict sterile technique including chlorohexadine preps, sterile drape, sterile gown and sterile gloves.    The area was prepped, draped and anesthetized in the usual sterile manner. Patient comfort was obtained.    A triple lumen catheter was placed in RT  Internal Jugular Vein There was good blood return, catheter caps were placed on lumens, catheter flushed easily, the line was secured and a sterile dressing and BIO-PATCH applied.   Ultrasound was used to visualize vasculature and guidance of needle.   Number of Attempts: 1 Complications:none Estimated Blood Loss: none Chest Radiograph indicated and ordered.  Operator: Nusrat Encarnacion.   Travelle Mcclimans David Jamariyah Johannsen, M.D.  [facility]  Medical Director ICU-[facility] [place] Medical Director [facility] Cardio-Pulmonary Department     

## 2019-06-07 LAB — CBC WITH DIFFERENTIAL/PLATELET
Abs Immature Granulocytes: 0.05 10*3/uL (ref 0.00–0.07)
Basophils Absolute: 0.1 10*3/uL (ref 0.0–0.1)
Basophils Relative: 0 %
Eosinophils Absolute: 0 10*3/uL (ref 0.0–0.5)
Eosinophils Relative: 0 %
HCT: 26.2 % — ABNORMAL LOW (ref 36.0–46.0)
Hemoglobin: 8.4 g/dL — ABNORMAL LOW (ref 12.0–15.0)
Immature Granulocytes: 0 %
Lymphocytes Relative: 2 %
Lymphs Abs: 0.2 10*3/uL — ABNORMAL LOW (ref 0.7–4.0)
MCH: 31.9 pg (ref 26.0–34.0)
MCHC: 32.1 g/dL (ref 30.0–36.0)
MCV: 99.6 fL (ref 80.0–100.0)
Monocytes Absolute: 0.4 10*3/uL (ref 0.1–1.0)
Monocytes Relative: 3 %
Neutro Abs: 12.1 10*3/uL — ABNORMAL HIGH (ref 1.7–7.7)
Neutrophils Relative %: 95 %
Platelets: 228 10*3/uL (ref 150–400)
RBC: 2.63 MIL/uL — ABNORMAL LOW (ref 3.87–5.11)
RDW: 19.6 % — ABNORMAL HIGH (ref 11.5–15.5)
Smear Review: NORMAL
WBC: 12.8 10*3/uL — ABNORMAL HIGH (ref 4.0–10.5)
nRBC: 0.2 % (ref 0.0–0.2)

## 2019-06-07 LAB — C3 COMPLEMENT: C3 Complement: 44 mg/dL — ABNORMAL LOW (ref 82–167)

## 2019-06-07 LAB — BASIC METABOLIC PANEL
Anion gap: 9 (ref 5–15)
BUN: 39 mg/dL — ABNORMAL HIGH (ref 8–23)
CO2: 18 mmol/L — ABNORMAL LOW (ref 22–32)
Calcium: 7.6 mg/dL — ABNORMAL LOW (ref 8.9–10.3)
Chloride: 119 mmol/L — ABNORMAL HIGH (ref 98–111)
Creatinine, Ser: 2.74 mg/dL — ABNORMAL HIGH (ref 0.44–1.00)
GFR calc Af Amer: 18 mL/min — ABNORMAL LOW (ref >60)
GFR calc non Af Amer: 15 mL/min — ABNORMAL LOW (ref >60)
Glucose, Bld: 196 mg/dL — ABNORMAL HIGH (ref 70–99)
Potassium: 4.2 mmol/L (ref 3.5–5.1)
Sodium: 146 mmol/L — ABNORMAL HIGH (ref 135–145)

## 2019-06-07 LAB — HEPATIC FUNCTION PANEL
ALT: 16 U/L (ref 0–44)
AST: 27 U/L (ref 15–41)
Albumin: 1.6 g/dL — ABNORMAL LOW (ref 3.5–5.0)
Alkaline Phosphatase: 136 U/L — ABNORMAL HIGH (ref 38–126)
Bilirubin, Direct: 0.2 mg/dL (ref 0.0–0.2)
Indirect Bilirubin: 0.5 mg/dL (ref 0.3–0.9)
Total Bilirubin: 0.7 mg/dL (ref 0.3–1.2)
Total Protein: 4.3 g/dL — ABNORMAL LOW (ref 6.5–8.1)

## 2019-06-07 LAB — GLUCOSE, CAPILLARY
Glucose-Capillary: 213 mg/dL — ABNORMAL HIGH (ref 70–99)
Glucose-Capillary: 48 mg/dL — ABNORMAL LOW (ref 70–99)
Glucose-Capillary: 176 mg/dL — ABNORMAL HIGH (ref 70–99)

## 2019-06-07 LAB — C4 COMPLEMENT: Complement C4, Body Fluid: 13 mg/dL — ABNORMAL LOW (ref 14–44)

## 2019-06-07 LAB — PHOSPHORUS: Phosphorus: 5.9 mg/dL — ABNORMAL HIGH (ref 2.5–4.6)

## 2019-06-07 LAB — PROTIME-INR
INR: 3.4 — ABNORMAL HIGH (ref 0.8–1.2)
Prothrombin Time: 34 seconds — ABNORMAL HIGH (ref 11.4–15.2)

## 2019-06-07 LAB — MAGNESIUM: Magnesium: 1.7 mg/dL (ref 1.7–2.4)

## 2019-06-07 MED ORDER — ONDANSETRON HCL 4 MG/2ML IJ SOLN: 4.0000 mg | Freq: Four times a day (QID) | INTRAMUSCULAR | Status: DC | PRN

## 2019-06-07 MED ORDER — BIOTENE DRY MOUTH MT LIQD: 15.0000 mL | OROMUCOSAL | Status: DC | PRN

## 2019-06-07 MED ORDER — HALOPERIDOL LACTATE 5 MG/ML IJ SOLN: 0.5000 mg | INTRAMUSCULAR | Status: DC | PRN

## 2019-06-07 MED ORDER — FENTANYL BOLUS VIA INFUSION
25.0000 ug | INTRAVENOUS | Status: DC | PRN
  Filled 2019-06-07: qty 100

## 2019-06-07 MED ORDER — MIDAZOLAM HCL 2 MG/2ML IJ SOLN
2.0000 mg | Freq: Once | INTRAMUSCULAR | Status: AC
  Administered 2019-06-07: 2 mg via INTRAVENOUS
  Filled 2019-06-07: qty 2

## 2019-06-07 MED ORDER — ACETAMINOPHEN 650 MG RE SUPP: 650.0000 mg | Freq: Four times a day (QID) | RECTAL | Status: DC | PRN

## 2019-06-07 MED ORDER — CHLORHEXIDINE GLUCONATE 0.12% ORAL RINSE (MEDLINE KIT)
15.0000 mL | Freq: Two times a day (BID) | OROMUCOSAL | Status: DC
  Administered 2019-06-07 (×2): 15 mL via OROMUCOSAL

## 2019-06-07 MED ORDER — DEXTROSE 50 % IV SOLN
INTRAVENOUS | Status: AC
  Administered 2019-06-07: 50 mL
  Filled 2019-06-07: qty 50

## 2019-06-07 MED ORDER — GLYCOPYRROLATE 0.2 MG/ML IJ SOLN: 0.2000 mg | INTRAMUSCULAR | Status: DC | PRN

## 2019-06-07 MED ORDER — DEXTROSE 10 % IV SOLN
INTRAVENOUS | Status: DC
  Administered 2019-06-07: 04:00:00 900 mL via INTRAVENOUS

## 2019-06-07 MED ORDER — POLYVINYL ALCOHOL 1.4 % OP SOLN
1.0000 [drp] | Freq: Four times a day (QID) | OPHTHALMIC | Status: DC | PRN
  Filled 2019-06-07: qty 15

## 2019-06-07 MED ORDER — ORAL CARE MOUTH RINSE
15.0000 mL | OROMUCOSAL | Status: DC
  Administered 2019-06-07 (×6): 15 mL via OROMUCOSAL

## 2019-06-07 MED ORDER — LORAZEPAM 2 MG/ML IJ SOLN: 2.0000 mg | Freq: Once | INTRAMUSCULAR | Status: DC

## 2019-06-07 MED ORDER — LORAZEPAM 2 MG/ML IJ SOLN
1.0000 mg | INTRAMUSCULAR | Status: DC | PRN
  Administered 2019-06-07: 15:00:00 1 mg via INTRAVENOUS
  Filled 2019-06-07: qty 1

## 2019-06-07 MED ORDER — MIDAZOLAM HCL 2 MG/2ML IJ SOLN: 2.0000 mg | INTRAMUSCULAR | Status: DC | PRN

## 2019-06-09 LAB — CULTURE, BLOOD (ROUTINE X 2)
Culture: NO GROWTH
Special Requests: ADEQUATE

## 2019-06-10 ENCOUNTER — Ambulatory Visit: Payer: Medicare Other | Admitting: Physician Assistant

## 2019-06-10 LAB — CULTURE, BLOOD (ROUTINE X 2)
Culture: NO GROWTH
Culture: NO GROWTH

## 2019-06-11 ENCOUNTER — Telehealth: Payer: Self-pay | Admitting: Internal Medicine

## 2019-06-11 NOTE — Telephone Encounter (Signed)
Placed in red folder  

## 2019-06-11 NOTE — Telephone Encounter (Signed)
PT's death certificate was dropped off to be signed. Certificate is up front in Dr. Lupita Dawn color folder.

## 2019-06-12 NOTE — Telephone Encounter (Signed)
Death certificate completed copies made and all  returned in red folder

## 2019-06-13 NOTE — Telephone Encounter (Signed)
Placed up front for pick up and funeral home has been notified.

## 2019-06-18 NOTE — Unmapped (Signed)
This patient has been disenrolled from the [facility] specialty pharmacy services due to the patient is now deceased (notified by son via phone call today).Stefanie Small Rocklin Soderquist  [facility] Specialty Pharmacist

## 2019-06-20 NOTE — Progress Notes (Signed)
[employer] [facility] [place] [facility]   PATIENT NAME: Diana [name]    MR#:  [id number]  DATE OF BIRTH:  [birth year]  SUBJECTIVE:  CHIEF COMPLAINT:  No chief complaint on file. Remains intubated, family planning to meet at bedside and likely terminal extubation.  Patient actively dying REVIEW OF SYSTEMS:  ROS unable to obtain as she is intubated DRUG ALLERGIES:   Allergies  Allergen Reactions  . Iodinated Diagnostic Agents Hives    Pt states she broke out in hives years ago, has been premedicated in the past.   . Tramadol Rash       . Amoxicillin Other (See Comments)    Reaction:  Stomach cramps   . Cefuroxime Itching  . Metrizamide Hives  . Morphine And Related     Reaction: face flushed    . Propoxyphene     Stomach cramp  . Adhesive [Tape] Rash    Silk tape causes red skin  . Cephalexin Rash  . Sulfa Antibiotics Rash  . Sulfonamide Derivatives Rash  . Venlafaxine Rash   VITALS:  Blood pressure (!) 64/37, pulse (!) 102, temperature 97.9 F (36.6 C), temperature source Axillary, resp. rate 15, height 5' (1.524 m), weight 49.6 kg, SpO2 93 %. PHYSICAL EXAMINATION:  Physical Exam Constitutional:      General: She is in acute distress.     Appearance: She is cachectic. She is ill-appearing and toxic-appearing.  HENT:     Head: Normocephalic and atraumatic.     Comments: ETT in place Eyes:     Conjunctiva/sclera: Conjunctivae normal.     Pupils: Pupils are equal, round, and reactive to light.  Neck:     Musculoskeletal: Normal range of motion and neck supple.     Thyroid: No thyromegaly.     Trachea: No tracheal deviation.  Cardiovascular:     Rate and Rhythm: Normal rate and regular rhythm.     Heart sounds: Normal heart sounds.  Pulmonary:     Effort: Accessory muscle usage and respiratory distress present.     Breath sounds: Decreased breath sounds present. No wheezing.  Chest:     Chest wall: No tenderness.  Abdominal:     General: Bowel  sounds are normal. There is no distension.     Palpations: Abdomen is soft.     Tenderness: There is no abdominal tenderness.  Musculoskeletal: Normal range of motion.  Skin:    General: Skin is warm and dry.     Findings: No rash.  Neurological:     Cranial Nerves: No cranial nerve deficit.     Comments: Sedated on vent  Psychiatric:     Comments: Sedated on vent    LABORATORY PANEL:  Female CBC Recent Labs  Lab 06/19/2019 0507  WBC 12.8*  HGB 8.4*  HCT 26.2*  PLT 228   ------------------------------------------------------------------------------------------------------------------ Chemistries  Recent Labs  Lab 06/02/2019 0507  NA 146*  K 4.2  CL 119*  CO2 18*  GLUCOSE 196*  BUN 39*  CREATININE 2.74*  CALCIUM 7.6*  MG 1.7  AST 27  ALT 16  ALKPHOS 136*  BILITOT 0.7   RADIOLOGY:  No results found. ASSESSMENT AND PLAN:  Patient is a84 y.o.femalewith lupus nephritis, SLE, pulmonary hypertension, history of DVT on Coumadin anemia of chronic kidney disease here is being admitted for sepsis  *Acute hypoxic and Hypercapnic Respiratory Failure - from septic shock and aspiration  *Septic shock   *Pneumonia  *Acute renal failure:  *Chronic diastolic heart  failure/HFpEF  * Anemia of chronic kidney disease  * Left breast cancer continue letrozole until dec 2020  *Left shoulder pain from recent injury    She looks critically sick and high risk for cardiorespiratory failure and multiorgan failure,  overall poor prognosis.   Actively dying.  Family arriving at bedside and likely terminal extubation later today.  We will sign off at this time Case discussed with ICU team.  Patient/family chose full code and care so intubated earlier   All the records are reviewed and case discussed with Care Management/Social Worker. Management plans discussed with the patient, intensivist and they are in agreement.  CODE STATUS: DNR  TOTAL TIME TAKING CARE OF  THIS PATIENT: 15 minutes.   More than 50% of the time was spent in counseling/coordination of care: YES     Delfino Lovett M.D on 05/28/2019 at 1:51 PM  Between 7am to 6pm - Pager - [phone]  After 6pm go to [email or web address] - Social research officer, government  [facility] [place] [facility]  Office  [phone]  CC: Primary care physician; Sherlene Shams, MD  Note: This dictation was prepared with Dragon dictation along with smaller phrase technology. Any transcriptional errors that result from this process are unintentional.

## 2019-06-20 NOTE — Progress Notes (Signed)
Pt remains mechanically intubated attempted to wean sedation medication this morning, however pts heart rate increased to the 170's and pt with agonal respirations fentanyl gtt increased.  CXR concerning for worsening pulmonary edema and ARDS pattern.  Lab results revealed worsening renal failure and pt oliguric. Discussed decline in pt condition and poor prognosis with pts son Diana [name] who is currently at bedside.  He stated his mother has suffered for a while now and he does not want to prolong her suffering or escalate care.  He has decided once all family members arrives at pts bedside later today he would like to transition pt to Comfort Measures Only.  Discussed change in plan of care with pts nurse Levada Dy.  Will continue to monitor and assess pt.  Marda Stalker, [place] Pager [phone] (please enter 7 digits) PCCM Consult Pager [phone] (please enter 7 digits)

## 2019-06-20 NOTE — Progress Notes (Signed)
Pt terminally extubated to room air.  Diana [name], [place] Pager [phone] (please enter 7 digits) PCCM Consult Pager [phone] (please enter 7 digits)

## 2019-06-20 NOTE — Progress Notes (Signed)
Patient passed away this shift.  Pronounced by two RNs at 1548.  Family took belongings home with them. Body prepared for morgue at this time.  All lines removed. CDS notified and released.

## 2019-06-20 NOTE — Progress Notes (Signed)
Pts family have arrived at bedside and are requesting to transition pt to comfort measures only.  Therefore, will proceed with terminal extubation.  Marda Stalker, [place] Pager [phone] (please enter 7 digits) PCCM Consult Pager [phone] (please enter 7 digits)

## 2019-06-20 NOTE — Progress Notes (Signed)
Daily Progress Note   Patient Name: Diana [name]       Date: 08-Jun-2019 DOB: 1934/09/30  Age: 84 y.o. MRN#: [id number] Attending Physician: Delfino Lovett, MD Primary Care Physician: Sherlene Shams, MD Admit Date: 06/17/2019  Reason for Consultation/Follow-up: Psychosocial/spiritual support  Subjective: Patient is resting in bed on ventilator with RT following. Mouth care by nursing in which patient does not respond to. Edema decreased in arms. No family at bedside.   Length of Stay: 3  Current Medications: Scheduled Meds:  . chlorhexidine gluconate (MEDLINE KIT)  15 mL Mouth Rinse BID  . Chlorhexidine Gluconate Cloth  6 each Topical Q0600  . feeding supplement (ENSURE ENLIVE)  237 mL Oral BID BM  . feeding supplement (VITAL AF 1.2 CAL)  1,000 mL Per Tube Q24H  . mouth rinse  15 mL Mouth Rinse 10 times per day  . nystatin  5 mL Oral QID  . pantoprazole sodium  40 mg Per Tube BID  . vancomycin variable dose per unstable renal function (pharmacist dosing)   Does not apply See admin instructions  . Warfarin - Pharmacist Dosing Inpatient   Does not apply q1800    Continuous Infusions: . sodium chloride Stopped (06/05/19 1154)  . ampicillin-sulbactam (UNASYN) IV Stopped (06/06/19 1532)  . dextrose 25 mL/hr at 06/08/19 0902  . fentaNYL infusion INTRAVENOUS 150 mcg/hr (06-08-2019 0902)  . norepinephrine (LEVOPHED) Adult infusion 7 mcg/min (08-Jun-2019 0945)  . sodium chloride     And  . sodium chloride    . vasopressin (PITRESSIN) infusion - *FOR SHOCK* 0.03 Units/min (06/08/19 0100)    PRN Meds: sodium chloride  Physical Exam Constitutional:      Comments: On vent  Musculoskeletal:     Right lower leg: Edema present.     Left lower leg: Edema present.             Vital Signs:  BP (!) 64/37 (BP Location: Right Arm)   Pulse (!) 109   Temp 97.8 F (36.6 C) (Axillary)   Resp 18   Ht 5' (1.524 m)   Wt 49.6 kg   SpO2 94%   BMI 21.36 kg/m  SpO2: SpO2: 94 % O2 Device: O2 Device: Ventilator O2 Flow Rate: O2 Flow Rate (L/min): 5 L/min  Intake/output summary:   Intake/Output Summary (Last 24  hours) at 06-28-19 1014 Last data filed at 28-Jun-2019 0902 Gross per 24 hour  Intake 3563.5 ml  Output 110 ml  Net 3453.5 ml   LBM: Last BM Date: (PTA) Baseline Weight: Weight: 47 kg Most recent weight: Weight: 49.6 kg       Palliative Assessment/Data:      Patient Active Problem List   Diagnosis Date Noted  . Aspiration into airway   . Community acquired pneumonia of left lung   . Hypoxia   . Pressure injury of skin 06/05/2019  . Sepsis (HCC)   . AKI (acute kidney injury) (HCC)   . Chronic heart failure with preserved ejection fraction (HFpEF) (HCC)   . Paroxysmal atrial fibrillation (HCC)   . Medication monitoring encounter 05/06/2019  . Lymphedema of both lower extremities 04/24/2019  . Problems with swallowing and mastication   . Nausea and vomiting   . Dysphasia   . Food impaction of esophagus   . Stricture and stenosis of esophagus   . Diverticulitis 03/10/2019  . Abdominal pain, chronic, left lower quadrant 12/13/2018  . Osteoporosis 08/11/2018  . Nocturnal hypoxemia due to pulmonary hypertension (HCC) 07/29/2018  . Tubular adenoma of colon 06/08/2018  . Chronic right hip pain 05/09/2018  . Hospital discharge follow-up 12/16/2017  . Ventral hernia without obstruction or gangrene 11/09/2017  . Anticoagulation goal of INR 2 to 3 09/14/2016  . Acquired asplenia 08/21/2016  . Sjogrens syndrome (HCC) 08/21/2016  . Long term systemic steroid user 08/17/2016  . Facet arthritis of lumbosacral region 12/16/2015  . History of Helicobacter pylori infection 08/17/2015  . H/O adenomatous polyp of colon 08/17/2015  . Hypertension 08/17/2015  .  Hyperlipidemia, unspecified 08/17/2015  . Absolute anemia 07/17/2015  . History of compression fracture of vertebral column 07/03/2015  . Generalized weakness 07/03/2015  . Anemia, iron deficiency 07/03/2015  . Esophagitis, CMV (cytomegalovirus) (HCC) 05/26/2015  . History of fall within past 90 days 04/07/2015  . Protein-calorie malnutrition (HCC) 04/06/2015  . History of gastric surgery 03/24/2015  . Chronic nausea 03/24/2015  . History of shingles 11/10/2014  . Venous (peripheral) insufficiency 06/13/2014  . Breast cancer, left (HCC) 05/26/2014  . Malignant neoplasm of upper-outer quadrant of female breast (HCC) 05/26/2014  . Urinary incontinence 01/15/2014  . Other specified disorders of bladder 01/02/2014  . Familial multiple lipoprotein-type hyperlipidemia 08/21/2013  . History of recurrent pneumonia 05/26/2013  . Pulmonary hypertensive venous disease (HCC) 04/23/2013  . Diastolic heart failure (HCC) 04/23/2013  . Long term current use of anticoagulant therapy 03/27/2013  . Incomplete bladder emptying 03/06/2013  . Chronic venous embolism and thrombosis of deep vessels of lower extremity (HCC) 01/07/2013  . Hernia, hiatal 01/06/2013  . Diaphragmatic hernia 01/06/2013  . Left leg DVT (HCC) 12/18/2012  . Sciatica 11/20/2012  . Microscopic hematuria 11/14/2012  . Insomnia 11/08/2011  . Lupus erythematosus 06/13/2008  . NEUROPATHY-PERIPHERAL 10/06/2006  . Hereditary and idiopathic peripheral neuropathy 10/06/2006  . Hypothyroidism 09/25/2006  . ANEMIA, B12 DEFICIENCY 09/25/2006  . GERD 09/25/2006  . Lung involvement in systemic lupus erythematosus (HCC) 09/25/2006  . Type 2 diabetes mellitus without complications (HCC) 09/25/2006  . SHINGLES, HX OF 09/25/2006  . TOBACCO ABUSE, HX OF 09/25/2006  . Personal history of endocrine, metabolic or immunity disorder 62/13/0865    Palliative Care Assessment & Plan    Recommendations/Plan:  Intubated. Per son yesterday, plan is  to wait a few days to see how she does before 1 way extubation.    Code Status:  Code Status Orders  (From admission, onward)         Start     Ordered   06/06/19 1431  Do not attempt resuscitation (DNR)  Continuous    Question Answer Comment  In the event of cardiac or respiratory ARREST Do not call a "code blue"   In the event of cardiac or respiratory ARREST Do not perform Intubation, CPR, defibrillation or ACLS   In the event of cardiac or respiratory ARREST Use medication by any route, position, wound care, and other measures to relive pain and suffering. May use oxygen, suction and manual treatment of airway obstruction as needed for comfort.      06/06/19 1430        Code Status History    Date Active Date Inactive Code Status Order ID Comments User Context   03/10/2019 2103 03/13/2019 1739 Full Code [id number]  Pearletha Alfred, NP ED   11/29/2017 0355 12/02/2017 1441 Full Code [id number]  Arnaldo Natal, MD Inpatient   07/17/2015 1842 07/20/2015 2004 Full Code [id number]  Shaune Pollack, MD Inpatient   06/09/2015 0141 06/13/2015 1706 Full Code [id number]  Oralia Manis, MD Inpatient   03/28/2015 1041 04/03/2015 1711 Full Code [id number]  Earline Mayotte, MD Inpatient   03/26/2015 1400 03/28/2015 1041 Full Code [id number]  Earline Mayotte, MD Inpatient   03/11/2015 1104 03/20/2015 1919 Full Code [id number]  Earline Mayotte, MD Inpatient   Advance Care Planning Activity    Advance Directive Documentation     Most Recent Value  Type of Advance Directive  Living will, Healthcare Power of Attorney  Pre-existing out of facility DNR order (yellow form or pink MOST form)  -  "MOST" Form in Place?  -       Prognosis:  Very poor.     Care plan was discussed with RN, MD  Thank you for allowing the Palliative Medicine Team to assist in the care of this patient.   Total Time 15 min Prolonged Time Billed no      Greater than 50%  of this time was spent counseling and  coordinating care related to the above assessment and plan.  Morton Stall, NP  Please contact Palliative Medicine Team phone at [phone] for questions and concerns.

## 2019-06-20 NOTE — Progress Notes (Signed)
Pt was suctioned prior to extubation for a moderate amount of thick tan secretions. Per Anthony Sar NP order, she was extubated and placed on comfort care.

## 2019-06-20 NOTE — Progress Notes (Signed)
Progress Note  Patient Name: Diana [name] Date of Encounter: 06/08/2019  Primary Cardiologist: Lucienne Minks  Subjective   Discussed overnight events with nursing staff. Per nursing staff, difficulty establishing arterial line overnight. Patient also with rates into the 170s, requiring further sedation when attempting to wake this AM.   Remains intubated.  Inpatient Medications    Scheduled Meds:  calcium-vitamin D  1 tablet Oral Daily   chlorhexidine gluconate (MEDLINE KIT)  15 mL Mouth Rinse BID   Chlorhexidine Gluconate Cloth  6 each Topical Q0600   [START ON 06/20/2019] cyanocobalamin  1,000 mcg Intramuscular Q30 days   feeding supplement (ENSURE ENLIVE)  237 mL Oral BID BM   feeding supplement (VITAL AF 1.2 CAL)  1,000 mL Per Tube Q24H   hydrocortisone sod succinate (SOLU-CORTEF) inj  50 mg Intravenous Q6H   hydroxychloroquine  200 mg Oral Daily   letrozole  2.5 mg Oral Daily   mouth rinse  15 mL Mouth Rinse 10 times per day   multivitamin  15 mL Per Tube Daily   mycophenolate  1,000 mg Oral BID   nystatin  5 mL Oral QID   pantoprazole sodium  40 mg Per Tube BID   saccharomyces boulardii  250 mg Oral Daily   valGANciclovir  450 mg Oral Q48H   vancomycin variable dose per unstable renal function (pharmacist dosing)   Does not apply See admin instructions   vitamin C  1,000 mg Oral Daily   Warfarin - Pharmacist Dosing Inpatient   Does not apply q1800   Continuous Infusions:  sodium chloride Stopped (06/05/19 1154)   albumin human Stopped (05/23/2019 0012)   ampicillin-sulbactam (UNASYN) IV Stopped (06/06/19 1532)   dextrose 900 mL (05/27/2019 0410)   fentaNYL infusion INTRAVENOUS 100 mcg/hr (06/12/2019 0614)   norepinephrine (LEVOPHED) Adult infusion 6 mcg/min (05/24/2019 0411)   phenylephrine (NEO-SYNEPHRINE) Adult infusion Stopped (06/06/2019 0132)   sodium chloride     And   sodium chloride     vasopressin (PITRESSIN) infusion - *FOR SHOCK* 0.03  Units/min (06/13/2019 0100)   PRN Meds: sodium chloride, ondansetron (ZOFRAN) IV, ondansetron, oxyCODONE-acetaminophen, prochlorperazine   Vital Signs    Vitals:   06/08/2019 0500 06/15/2019 0600 05/30/2019 0700 05/22/2019 0800  BP:      Pulse: (!) 108 (!) 105 (!) 106 (!) 107  Resp: 18 17 19 17   Temp:      TempSrc:      SpO2: 92% 94% 94% 93%  Weight: 49.6 kg     Height:        Intake/Output Summary (Last 24 hours) at 05/26/2019 0832 Last data filed at 06/19/2019 0411 Gross per 24 hour  Intake 2848.88 ml  Output 105 ml  Net 2743.88 ml   Last 3 Weights 06/14/2019 28-Jun-2019 05/23/2019  Weight (lbs) 109 lb 5.6 oz 103 lb 9.9 oz 104 lb  Weight (kg) 49.6 kg 47 kg 47.174 kg  Some encounter information is confidential and restricted. Go to Review Flowsheets activity to see all data.      Telemetry    Sinus tachycardia, runs of SVT, PACs, PVCs, and NSVT (up to 6 beats) with rates 100s-170s,  - Personally Reviewed  ECG    No new tracings- Personally Reviewed  Physical Exam   GEN: Intubated, sedated. Neck: Intubated. No JVD. Arterial line.  Cardiac: tachycardic with frequent ectopy, no murmurs, rubs, or gallops.  Respiratory: Coarse / ventilated breath sounds. GI: Soft, nontender, non-distended - abdominal binder and shoulder stabilizer MS: Bilateral lower  extremity erythema with right lower extremity bandage. Right upper extremity with erythema and edema, propped up on pillow but edema of upper extremity with R>L; No deformity. Abdominal binder, shoulder stabilizer. Neuro: Unresponsive, sedated Psych: Unresponsive, sedated  Labs    High Sensitivity Troponin:  No results for input(s): TROPONINIHS in the last 720 hours.    Cardiac EnzymesNo results for input(s): TROPONINI in the last 168 hours. No results for input(s): TROPIPOC in the last 168 hours.   Chemistry Recent Labs  Lab 05/28/2019 1240 06/18/2019 2229  06/06/19 0644 06/06/19 0840 06-Jul-2019 0507  NA 149* 148*   < > 146* 146*  146*  K 4.5 4.4   < > 4.6 3.9 4.2  CL 116* 118*   < > 118* 119* 119*  CO2 26 23   < > 19* 19* 18*  GLUCOSE 98 95   < > 30* 107* 196*  BUN 40* 42*   < > 37* 37* 39*  CREATININE 2.48* 2.38*   < > 2.21* 2.32* 2.74*  CALCIUM 8.0* 7.4*   < > 7.4* 7.0* 7.6*  PROT 5.9* 4.8*  --   --  4.2*  --   ALBUMIN 1.5* 1.2*  --   --  1.2*  --   AST 38 31  --   --  35  --   ALT 20 16  --   --  16  --   ALKPHOS 149* 138*  --   --  137*  --   BILITOT 0.8 0.8  --   --  0.8  --   GFRNONAA 17* 18*   < > 20* 19* 15*  GFRAA 20* 21*   < > 23* 22* 18*  ANIONGAP 7 7   < > 9 8 9    < > = values in this interval not displayed.     Hematology Recent Labs  Lab 06/06/19 0644 06/06/19 0840 2019-07-06 0507  WBC 8.1 11.6* 12.8*  RBC 2.93* 2.89* 2.63*  HGB 9.4* 9.1* 8.4*  HCT 27.8* 27.8* 26.2*  MCV 94.9 96.2 99.6  MCH 32.1 31.5 31.9  MCHC 33.8 32.7 32.1  RDW 19.4* 19.4* 19.6*  PLT 217 220 228    BNPNo results for input(s): BNP, PROBNP in the last 168 hours.   DDimer No results for input(s): DDIMER in the last 168 hours.   Radiology    Dg Neck Soft Tissue  Result Date: 06/05/2019 CLINICAL DATA:  Recent aspiration of pills, initial encounter EXAM: NECK SOFT TISSUES - 1+ VIEW COMPARISON:  None. FINDINGS: Examination is significantly limited due to overlying shoulder density. No definitive radiopaque foreign bodies are seen. No specific airway abnormality is noted. IMPRESSION: No acute abnormality noted although the examination is significantly limited by patient positioning. Electronically Signed   By: Alcide Clever M.D.   On: 06/05/2019 23:32   Dg Abd 1 View  Result Date: 06/06/2019 CLINICAL DATA:  Enteric tube placement. EXAM: ABDOMEN - 1 VIEW COMPARISON:  05/08/2018 FINDINGS: Enteric tube is present with tip over the mid to lower mid abdomen likely over the distal stomach. Bowel gas pattern is nonobstructive. Remainder of the exam is unchanged. IMPRESSION: Nonobstructive bowel gas pattern. Enteric tube with  tip over the midline lower abdomen likely over the distal stomach. Electronically Signed   By: Elberta Fortis M.D.   On: 06/06/2019 09:06   US Renal  Result Date: 06/05/2019 CLINICAL DATA:  84 year old female with acute renal insufficiency. EXAM: RENAL / URINARY TRACT ULTRASOUND COMPLETE COMPARISON:  CT of the abdomen pelvis dated 06/10/2019 FINDINGS: Right Kidney: Renal measurements: 10.4 x 4.4 x 5.1 cm = volume: 120 mL. There is mild increased renal parenchymal echogenicity. There is a 5 mm upper pole nonobstructing calculus. Smaller additional nonobstructing stone may be present. No hydronephrosis. There is a 1.2 x 1.2 x 1.6 cm lower pole cyst. Left Kidney: Renal measurements: 8.3 x 5.0 x 4.0 cm = volume: 90 mL. Mild increased echogenicity. No hydronephrosis or shadowing stone. There is a 4.1 x 3.0 x 3.0 cm partially exophytic cyst in the interpolar left kidney. Bladder: A Foley catheter is noted. The urinary bladder is distended despite presence of a Foley. The ureteral jets are not visualized. Fluid containing structure superior to the right kidney likely represents the gallbladder. IMPRESSION: 1. Mildly echogenic kidneys likely related to underlying medical renal disease. 2. Nonobstructing right renal stone.  No hydronephrosis. 3. Bilateral renal cysts. Electronically Signed   By: Elgie Collard M.D.   On: 06/05/2019 12:59   Dg Chest Port 1 View  Result Date: 06/06/2019 CLINICAL DATA:  Intubation. EXAM: PORTABLE CHEST 1 VIEW COMPARISON:  06/05/2019 FINDINGS: Endotracheal tube has tip 1.6 cm above the carina. Enteric tube courses into the stomach and off the film as tip is not visualized. Right IJ central venous catheter has tip just below the cavoatrial junction. Lungs are adequately inflated demonstrate mild interval worsening airspace opacification over the central mid lungs and bilateral lung bases likely bilateral effusions with bibasilar atelectasis as well as interstitial edema. Multifocal  infection is possible. Remainder the exam is unchanged. IMPRESSION: Interval worsening central airspace opacification over the mid lungs and bibasilar regions. Findings likely due to interstitial edema with bilateral effusions and bibasilar atelectasis. Multifocal infection is possible. Tubes and lines as described. Electronically Signed   By: Elberta Fortis M.D.   On: 06/06/2019 09:05   Dg Chest Port 1 View  Result Date: 06/05/2019 CLINICAL DATA:  Recent pill aspiration, initial encounter EXAM: PORTABLE CHEST 1 VIEW COMPARISON:  06/16/2019 FINDINGS: Cardiac shadow is enlarged. The overall inspiratory effort is poor. Left basilar atelectasis and small effusion are seen. No radiopaque foreign bodies are noted. No definitive bronchial occlusion is noted. No bony abnormality is seen. IMPRESSION: Left basilar atelectasis and small effusion somewhat progressed when compared with the prior exam. Overall poor inspiratory effort. Electronically Signed   By: Alcide Clever M.D.   On: 06/05/2019 23:33    Cardiac Studies   2D Echocardiogram 9.16.2020  IMPRESSIONS   1. The left ventricle has normal systolic function with an ejection fraction of 60-65%. The cavity size was normal. Left ventricular diastolic function could not be evaluated. 2. Left atrial size was normal. 3. Right atrial size was not assessed. 4. The aortic valve is grossly normal. Aortic valve regurgitation is trivial by color flow Doppler. 5. The pulmonic valve was not well visualized. Pulmonic valve regurgitation is not visualized by color flow Doppler. 6. The aorta is normal unless otherwise noted. 7. The interatrial septum was not assessed.  Patient Profile     84 y.o. female with a history of pulmonary embolism/DVT s/p IVC filter, atrial fibrillation on Coumadin, HFpEF,, PAH, severe lymphedema, and COPD who was admitted 9/15 after presenting with mechanical fall (9/14) and left humerus fracture and was found to be hypotensive,  febrile, septic/pneumonia, and volume depleted.  Assessment & Plan    Acute respiratory failure --Suspect aspiration on medications. Remains intubated.  --Hypotensive. Binder. Continue pressors. Hold all antihypertensive medications.  --Overall prognosis  poor. Palliative care on board. Further management per CCM.  Sepsis/pneumonia/lower extremity cellulitis --On abx. Continue to hold BP medications. Per CCM.  HFpEF/pulmonary arterial hypertension --Followed at [facility] in the past.  --Severe hypotension. --No BB/diuretics/antihypertensives.  Paroxysmal atrial fibrillation --SR. --Has been on chronic Warfarin and in hospital, Warfarin per pharmacy.  --INR supra-therapeutic at 3.4, likely in the setting of worsening kidney disease.  --Daily CBC as below with worsening Hgb. Continue to monitor.  --Heparin / Lovenox bridging  Recommended with prolonged intubation.   History of pulmonary embolism/DVT --Warfarin with recommendations as above.  Acute on chronic kidney disease stage /Volume depletion --Baseline Cr 1.75  2.32  2.74, BUN 37  39 --In setting of severe hypotension, worsening renal function. --Hold all antihypertensive medications.  --Nephrology following.  Anemia of chronic disease --Followed as an outpatient at the cancer center. --Hgb 9.1  8.4.  Abdominal discomfort --Abdominal CT with soft tissue mass of the sigmoid colon --Further work-up of mass pending stabilization of patient.   Left humeral fracture --Sustained from initial fall that brought her to the ED.  --L shoulder immobilizer --Ortho consult   Paroxysmal SVT, NSVT, Frequent ectopy as above --Rates elevated this AM, requiring sedation. --No BB in setting of severe hypotension.   For questions or updates, please contact [facility] Please consult [email or web address] for contact info under        Signed, Lennon Alstrom, PA-C  05/30/2019, 8:32 AM

## 2019-06-20 NOTE — Death Summary Note (Signed)
DEATH SUMMARY   Patient Details  Name: Diana [name] MRN: [id number] DOB: [birth year]  Admission/Discharge Information   Admit Date:  06-12-19  Date of Death: Date of Death: (P) 06/15/2019  Time of Death: Time of Death: (P) 1548  Length of Stay: 3  Referring Physician: Sherlene Shams, MD   Reason(s) for Hospitalization  Generalized Weakness   Diagnoses  Preliminary cause of death: Pneumonia Secondary Diagnoses (including complications and co-morbidities):  Active Problems:   Sepsis (HCC)   Pressure injury of skin   Aspiration into airway   Community acquired pneumonia of left lung   Hypoxia   Acute hypoxic hypercapnia respiratory failure secondary to pneumonia and pulmonary edema    vs. ARDS    Acute on chronic renal failure    Systemic lupus erythematosus    Minimally displaced left proximal humerus fracture    Septic and cardiogenic shock   Brief Hospital Course (including significant findings, care, treatment, and services provided and events leading to death)  Diana [name] is a 84 y.o. year old female who presented to [facility] ER on 2019-06-12 with acute hypoxic respiratory failure and sepsis secondary to pneumonia vs. bilateral lower extremity cellulitis, along with acute kidney injury nephrology consulted.  Pt ruled in for sepsis and iv antibiotic therapy initiated.  Pt also noted to have a minimally displaced left proximal humerus fracture following a fall at her home on 06/03/2019. Orthopedic surgery consulted recommended non-surgical management due to pts co-morbidities.  The pt was subsequently admitted to the stepdown unit for possible need of neo-synephrine gtt due to hypotension.  On 06/05/2019 she  developed acute hypoxic hypercapnic respiratory failure requiring Bipap following an aspiration episode while taking po medications.  She also developed worsening hypotension requiring vasopressor therapy.  Despite Bipap respiratory failure worsened and she required  mechanical intubation on 06/06/2019. ICU Intensivist discussed pt prognosis and goals of treatment with pts son.  Code status changed to DNR on 06/06/2019 per pts sons request.  On 06-15-2019 pts renal function and acute respiratory failure worsened.  Pts son transitioned pt to comfort measures only on 2019/06/15 and she expired at 1548 with family at bedside.   Pertinent Labs and Studies  Significant Diagnostic Studies Ct Abdomen Pelvis Wo Contrast  Result Date: Jun 12, 2019 CLINICAL DATA:  Fever EXAM: CT ABDOMEN AND PELVIS WITHOUT CONTRAST TECHNIQUE: Multidetector CT imaging of the abdomen and pelvis was performed following the standard protocol without IV contrast. COMPARISON:  March 10, 2019 FINDINGS: Lower chest: Consolidation the the visualized left lower lobe is. Minimal atelectasis of posterior right base is noted. Hepatobiliary: No focal liver abnormality is seen. No gallstones, gallbladder wall thickening, or biliary dilatation. Pancreas: Unremarkable. No pancreatic ductal dilatation or surrounding inflammatory changes. Spleen: Status post prior splenectomy. Adrenals/Urinary Tract: The adrenal glands are normal. There are cysts in bilateral kidneys, largest in the pole left kidney unchanged. 1 mm nonobstructing stone is identified in the lower pole right kidney. There is no hydronephrosis bilaterally. The bladder is decompressed with Foley catheter in place. Stomach/Bowel: There is a large hiatal hernia. There is no small bowel obstruction. There is diverticulosis of colon. The appendix is not seen but no inflammation is noted around cecum. There is question soft tissue measuring 3.2 x 1.7 cm in the sigmoid colon with mild generalized sigmoid colonic wall thickening. Vascular/Lymphatic: Aortic atherosclerosis. No enlarged abdominal or pelvic lymph nodes. Reproductive: Uterus and bilateral adnexa are unremarkable. Other: None. Musculoskeletal: Degenerative joint changes of the with scoliosis of spine  are  identified. Patient status vertebroplasty lumbar vertebral bodies. IMPRESSION: Consolidation with volume loss the visualized left lower lobe. Findings are more likely due to atelectasis but superimposed pneumonia is not excluded. Mild generalized thickening sigmoid colonic wall with a question soft tissue measuring 3.2 x 1 7 cm in the sigmoid colon. Consider further evaluation with barium enema outpatient basis. Electronically Signed   By: Sherian Rein M.D.   On: 06-28-19 14:38   Dg Neck Soft Tissue  Result Date: 06/05/2019 CLINICAL DATA:  Recent aspiration of pills, initial encounter EXAM: NECK SOFT TISSUES - 1+ VIEW COMPARISON:  None. FINDINGS: Examination is significantly limited due to overlying shoulder density. No definitive radiopaque foreign bodies are seen. No specific airway abnormality is noted. IMPRESSION: No acute abnormality noted although the examination is significantly limited by patient positioning. Electronically Signed   By: Alcide Clever M.D.   On: 06/05/2019 23:32   Dg Abd 1 View  Result Date: 06/06/2019 CLINICAL DATA:  Enteric tube placement. EXAM: ABDOMEN - 1 VIEW COMPARISON:  05/08/2018 FINDINGS: Enteric tube is present with tip over the mid to lower mid abdomen likely over the distal stomach. Bowel gas pattern is nonobstructive. Remainder of the exam is unchanged. IMPRESSION: Nonobstructive bowel gas pattern. Enteric tube with tip over the midline lower abdomen likely over the distal stomach. Electronically Signed   By: Elberta Fortis M.D.   On: 06/06/2019 09:06   Ct Head Wo Contrast  Result Date: 2019-06-28 CLINICAL DATA:  Altered level of consciousness.  Recent fall EXAM: CT HEAD WITHOUT CONTRAST TECHNIQUE: Contiguous axial images were obtained from the base of the skull through the vertex without intravenous contrast. COMPARISON:  Feb 12, 2007 FINDINGS: Brain: There is mild diffuse atrophy. There is no intracranial mass, hemorrhage, extra-axial fluid collection, or midline  shift. There is patchy small vessel disease in the centra semiovale bilaterally. No acute infarct is demonstrable on this study. Vascular: There is no hyperdense vessel. There is calcification in each carotid siphon region. Skull: Bony calvarium appears intact. Sinuses/Orbits: There is mucosal thickening in several ethmoid air cells. Other visualized paranasal sinuses are clear. Orbits appear symmetric bilaterally. Other: Mastoids on the right are clear. There is opacification of inferior mastoid air cells on the left. Most of the mastoid air cells on the left are clear. There is a sebaceous cyst containing calcification in the high right parietal vertex scalp region measuring 1.0 x 0.8 cm. IMPRESSION: Atrophy with periventricular small vessel disease. No acute infarct. No mass or hemorrhage. There are foci of arterial vascular calcification. There is mucosal thickening in several ethmoid air cells. There is inferior left mastoid air cell disease. Electronically Signed   By: Bretta Bang III M.D.   On: 28-Jun-2019 14:29   US Renal  Result Date: 06/05/2019 CLINICAL DATA:  84 year old female with acute renal insufficiency. EXAM: RENAL / URINARY TRACT ULTRASOUND COMPLETE COMPARISON:  CT of the abdomen pelvis dated 28-Jun-2019 FINDINGS: Right Kidney: Renal measurements: 10.4 x 4.4 x 5.1 cm = volume: 120 mL. There is mild increased renal parenchymal echogenicity. There is a 5 mm upper pole nonobstructing calculus. Smaller additional nonobstructing stone may be present. No hydronephrosis. There is a 1.2 x 1.2 x 1.6 cm lower pole cyst. Left Kidney: Renal measurements: 8.3 x 5.0 x 4.0 cm = volume: 90 mL. Mild increased echogenicity. No hydronephrosis or shadowing stone. There is a 4.1 x 3.0 x 3.0 cm partially exophytic cyst in the interpolar left kidney. Bladder: A Foley catheter is noted.  The urinary bladder is distended despite presence of a Foley. The ureteral jets are not visualized. Fluid containing structure  superior to the right kidney likely represents the gallbladder. IMPRESSION: 1. Mildly echogenic kidneys likely related to underlying medical renal disease. 2. Nonobstructing right renal stone.  No hydronephrosis. 3. Bilateral renal cysts. Electronically Signed   By: Elgie Collard M.D.   On: 06/05/2019 12:59   Dg Chest Port 1 View  Result Date: 06/06/2019 CLINICAL DATA:  Intubation. EXAM: PORTABLE CHEST 1 VIEW COMPARISON:  06/05/2019 FINDINGS: Endotracheal tube has tip 1.6 cm above the carina. Enteric tube courses into the stomach and off the film as tip is not visualized. Right IJ central venous catheter has tip just below the cavoatrial junction. Lungs are adequately inflated demonstrate mild interval worsening airspace opacification over the central mid lungs and bilateral lung bases likely bilateral effusions with bibasilar atelectasis as well as interstitial edema. Multifocal infection is possible. Remainder the exam is unchanged. IMPRESSION: Interval worsening central airspace opacification over the mid lungs and bibasilar regions. Findings likely due to interstitial edema with bilateral effusions and bibasilar atelectasis. Multifocal infection is possible. Tubes and lines as described. Electronically Signed   By: Elberta Fortis M.D.   On: 06/06/2019 09:05   Dg Chest Port 1 View  Result Date: 06/05/2019 CLINICAL DATA:  Recent pill aspiration, initial encounter EXAM: PORTABLE CHEST 1 VIEW COMPARISON:  05/24/2019 FINDINGS: Cardiac shadow is enlarged. The overall inspiratory effort is poor. Left basilar atelectasis and small effusion are seen. No radiopaque foreign bodies are noted. No definitive bronchial occlusion is noted. No bony abnormality is seen. IMPRESSION: Left basilar atelectasis and small effusion somewhat progressed when compared with the prior exam. Overall poor inspiratory effort. Electronically Signed   By: Alcide Clever M.D.   On: 06/05/2019 23:33   Dg Chest Portable 1 View  Result  Date: 06/17/2019 CLINICAL DATA:  Shortness of breath EXAM: PORTABLE CHEST 1 VIEW COMPARISON:  03/10/2019 FINDINGS: The heart size and mediastinal contours are within normal limits. Calcific aortic knob. Prominent retrocardiac opacity compatible with known large hiatal hernia. Increasing left perihilar and left basilar markings. Lungs hyperexpanded. No large pleural fluid collection. No pneumothorax. IMPRESSION: Increasing left perihilar and left basilar airspace opacity suspicious for developing infection in the appropriate clinical setting. Electronically Signed   By: Duanne Guess M.D.   On: 06/14/2019 13:02   Dg Shoulder Left  Result Date: 05/27/2019 CLINICAL DATA:  84 year old female with left shoulder pain. EXAM: LEFT SHOULDER - 2+ VIEW COMPARISON:  Chest radiograph date 05/28/2019 FINDINGS: There is apparent area cortical irregularity with a faint linear density through the left humeral neck concerning for an age indeterminate nondisplaced fracture, possibly acute. Clinical correlation is recommended. CT may provide better evaluation. The bones are osteopenic. There is no dislocation. The soft tissues are unremarkable. Left lung densities better described and seen on the prior radiograph. IMPRESSION: Fracture of the left humeral neck. Clinical correlation is recommended. Electronically Signed   By: Elgie Collard M.D.   On: 05/21/2019 16:49    Microbiology Recent Results (from the past 240 hour(s))  Culture, blood (Routine x 2)     Status: None (Preliminary result)   Collection Time: 06/10/2019 12:40 PM   Specimen: BLOOD  Result Value Ref Range Status   Specimen Description BLOOD BLOOD RIGHT ARM  Final   Special Requests   Final    BOTTLES DRAWN AEROBIC AND ANAEROBIC Blood Culture adequate volume   Culture   Final  NO GROWTH 3 DAYS Performed at [facility], [address] [place], [place] [address]    Report Status PENDING  Incomplete  SARS Coronavirus 2 [facility] order,  Performed in [facility] hospital lab) Nasopharyngeal Nasopharyngeal Swab     Status: None   Collection Time: 05/22/2019 12:40 PM   Specimen: Nasopharyngeal Swab  Result Value Ref Range Status   SARS Coronavirus 2 NEGATIVE NEGATIVE Final    Comment: (NOTE) If result is NEGATIVE SARS-CoV-2 target nucleic acids are NOT DETECTED. The SARS-CoV-2 RNA is generally detectable in upper and lower  respiratory specimens during the acute phase of infection. The lowest  concentration of SARS-CoV-2 viral copies this assay can detect is 250  copies / mL. A negative result does not preclude SARS-CoV-2 infection  and should not be used as the sole basis for treatment or other  patient management decisions.  A negative result may occur with  improper specimen collection / handling, submission of specimen other  than nasopharyngeal swab, presence of viral mutation(s) within the  areas targeted by this assay, and inadequate number of viral copies  (<250 copies / mL). A negative result must be combined with clinical  observations, patient history, and epidemiological information. If result is POSITIVE SARS-CoV-2 target nucleic acids are DETECTED. The SARS-CoV-2 RNA is generally detectable in upper and lower  respiratory specimens dur ing the acute phase of infection.  Positive  results are indicative of active infection with SARS-CoV-2.  Clinical  correlation with patient history and other diagnostic information is  necessary to determine patient infection status.  Positive results do  not rule out bacterial infection or co-infection with other viruses. If result is PRESUMPTIVE POSTIVE SARS-CoV-2 nucleic acids MAY BE PRESENT.   A presumptive positive result was obtained on the submitted specimen  and confirmed on repeat testing.  While 2019 novel coronavirus  (SARS-CoV-2) nucleic acids may be present in the submitted sample  additional confirmatory testing may be necessary for epidemiological  and / or  clinical management purposes  to differentiate between  SARS-CoV-2 and other Sarbecovirus currently known to infect humans.  If clinically indicated additional testing with an alternate test  methodology [phone]) is advised. The SARS-CoV-2 RNA is generally  detectable in upper and lower respiratory sp ecimens during the acute  phase of infection. The expected result is Negative. Fact Sheet for Patients:  BoilerBrush.com.cy Fact Sheet for Healthcare Providers: https://pope.com/ This test is not yet approved or cleared by the [place] FDA and has been authorized for detection and/or diagnosis of SARS-CoV-2 by FDA under an Emergency Use Authorization (EUA).  This EUA will remain in effect (meaning this test can be used) for the duration of the COVID-19 declaration under Section 564(b)(1) of the Act, 21 U.S.C. section 360bbb-3(b)(1), unless the authorization is terminated or revoked sooner. Performed at [facility], [address] [place], [place] [address]   MRSA PCR Screening     Status: None   Collection Time: 06/05/19 12:09 AM   Specimen: Nasal Mucosa; Nasopharyngeal  Result Value Ref Range Status   MRSA by PCR NEGATIVE NEGATIVE Final    Comment:        The GeneXpert MRSA Assay (FDA approved for NASAL specimens only), is one component of a comprehensive MRSA colonization surveillance program. It is not intended to diagnose MRSA infection nor to guide or monitor treatment for MRSA infections. Performed at [facility], [address], [place], [place] [address]   Culture, blood (x 2)  Status: None (Preliminary result)   Collection Time: 06/05/19  1:03 AM   Specimen: BLOOD  Result Value Ref Range Status   Specimen Description BLOOD BLOOD RIGHT HAND  Final   Special Requests   Final    BOTTLES DRAWN AEROBIC ONLY Blood Culture results may not be optimal due to an inadequate volume of blood received  in culture bottles   Culture   Final    NO GROWTH 2 DAYS Performed at [facility], [address] [place], [place] [address]    Report Status PENDING  Incomplete  Culture, blood (x 2)     Status: None (Preliminary result)   Collection Time: 06/05/19  1:27 AM   Specimen: BLOOD  Result Value Ref Range Status   Specimen Description BLOOD RIGHT WRIST  Final   Special Requests   Final    BOTTLES DRAWN AEROBIC ONLY Blood Culture results may not be optimal due to an inadequate volume of blood received in culture bottles   Culture   Final    NO GROWTH 2 DAYS Performed at [facility], [address] [place], [place] [address]    Report Status PENDING  Incomplete    Lab Basic Metabolic Panel: Recent Labs  Lab 06/16/2019 2229 06/05/19 0127 06/06/19 0644 06/06/19 0840 June 22, 2019 0507  NA 148* 149* 146* 146* 146*  K 4.4 4.6 4.6 3.9 4.2  CL 118* 119* 118* 119* 119*  CO2 23 23 19* 19* 18*  GLUCOSE 95 82 30* 107* 196*  BUN 42* 43* 37* 37* 39*  CREATININE 2.38* 2.33* 2.21* 2.32* 2.74*  CALCIUM 7.4* 7.5* 7.4* 7.0* 7.6*  MG  --   --   --   --  1.7  PHOS  --   --   --   --  5.9*   Liver Function Tests: Recent Labs  Lab 06/17/2019 1240 05/30/2019 2229 06/06/19 0840 2019-06-22 0507  AST 38 31 35 27  ALT 20 16 16 16   ALKPHOS 149* 138* 137* 136*  BILITOT 0.8 0.8 0.8 0.7  PROT 5.9* 4.8* 4.2* 4.3*  ALBUMIN 1.5* 1.2* 1.2* 1.6*   No results for input(s): LIPASE, AMYLASE in the last 168 hours. No results for input(s): AMMONIA in the last 168 hours. CBC: Recent Labs  Lab 06/14/2019 1240 06/11/2019 2229 06/05/19 0127 06/06/19 0644 06/06/19 0840 June 22, 2019 0507  WBC 9.7 13.0* 12.4* 8.1 11.6* 12.8*  NEUTROABS 7.1  --   --   --  9.9* 12.1*  HGB 11.3* 9.6* 10.3* 9.4* 9.1* 8.4*  HCT 34.4* 29.3* 32.3* 27.8* 27.8* 26.2*  MCV 95.8 95.4 98.2 94.9 96.2 99.6  PLT 288 193 243 217 220 228   Cardiac Enzymes: No results for input(s): CKTOTAL, CKMB, CKMBINDEX, TROPONINI in the last  168 hours. Sepsis Labs: Recent Labs  Lab 05/25/2019 1240 06/05/2019 2229 06/05/19 0127 06/06/19 0644 06/06/19 0840 06/06/19 1421 06/22/19 0507  PROCALCITON  --  1.21 1.83 3.73  --   --   --   WBC 9.7 13.0* 12.4* 8.1 11.6*  --  12.8*  LATICACIDVEN 1.8  --   --   --  1.9 1.4  --     Procedures/Operations  Mechanical Intubation  Right Internal Jugular Central Line  Placement  Right Femoral Arterial Line Placement   Sonda Rumble, [place]  Pulmonary/Critical Care Pager [phone] (please enter 7 digits) PCCM Consult Pager [phone] (please enter 7 digits)

## 2019-06-20 NOTE — Progress Notes (Signed)
Enteric tube courses into the stomach and off the film as tip is not visualized. Right IJ central venous catheter has tip just below the cavoatrial junction. Lungs are adequately inflated demonstrate mild interval worsening airspace opacification over the central mid lungs and bilateral lung bases likely bilateral effusions with bibasilar atelectasis as well as interstitial edema. Multifocal infection is possible. Remainder the exam is unchanged. IMPRESSION: Interval worsening central airspace opacification over the mid lungs and bibasilar regions. Findings likely due to interstitial edema with bilateral effusions and bibasilar atelectasis. Multifocal infection is possible. Tubes and lines as described. Electronically Signed   By: Marin Olp M.D.   On: 06/06/2019 09:05   Dg Chest Port 1 View  Result Date: 06/05/2019 CLINICAL DATA:  Recent pill aspiration, initial encounter EXAM: PORTABLE CHEST 1 VIEW COMPARISON:  06/19/2019 FINDINGS: Cardiac shadow is enlarged. The overall inspiratory effort is poor. Left basilar atelectasis and small effusion are seen. No radiopaque foreign bodies are noted. No definitive bronchial occlusion is noted. No bony abnormality is seen. IMPRESSION: Left basilar atelectasis and small effusion somewhat progressed when compared with the prior exam. Overall poor inspiratory effort. Electronically Signed   By: Inez Catalina M.D.   On: 06/05/2019 23:33     Medications:   . sodium chloride Stopped (06/05/19  1154)  . albumin human Stopped (Jun 22, 2019 0012)  . ampicillin-sulbactam (UNASYN) IV Stopped (06/06/19 1532)  . dextrose 25 mL/hr at 06-22-19 0902  . fentaNYL infusion INTRAVENOUS 150 mcg/hr (2019-06-22 0902)  . norepinephrine (LEVOPHED) Adult infusion 6 mcg/min (2019-06-22 0411)  . phenylephrine (NEO-SYNEPHRINE) Adult infusion Stopped (22-Jun-2019 0129)  . sodium chloride     And  . sodium chloride    . vasopressin (PITRESSIN) infusion - *FOR SHOCK* 0.03 Units/min (2019-06-22 0100)   . calcium-vitamin D  1 tablet Oral Daily  . chlorhexidine gluconate (MEDLINE KIT)  15 mL Mouth Rinse BID  . Chlorhexidine Gluconate Cloth  6 each Topical Q0600  . [START ON 06/20/2019] cyanocobalamin  1,000 mcg Intramuscular Q30 days  . feeding supplement (ENSURE ENLIVE)  237 mL Oral BID BM  . feeding supplement (VITAL AF 1.2 CAL)  1,000 mL Per Tube Q24H  . hydrocortisone sod succinate (SOLU-CORTEF) inj  50 mg Intravenous Q6H  . hydroxychloroquine  200 mg Oral Daily  . letrozole  2.5 mg Oral Daily  . mouth rinse  15 mL Mouth Rinse 10 times per day  . multivitamin  15 mL Per Tube Daily  . mycophenolate  1,000 mg Oral BID  . nystatin  5 mL Oral QID  . pantoprazole sodium  40 mg Per Tube BID  . saccharomyces boulardii  250 mg Oral Daily  . valGANciclovir  450 mg Oral Q48H  . vancomycin variable dose per unstable renal function (pharmacist dosing)   Does not apply See admin instructions  . vitamin C  1,000 mg Oral Daily  . Warfarin - Pharmacist Dosing Inpatient   Does not apply q1800   sodium chloride, ondansetron (ZOFRAN) IV, ondansetron, oxyCODONE-acetaminophen, prochlorperazine  Assessment/ Plan:  84 y.o. female with systemic lupus erythematosus, remote lupus nephritis 1992, sicca symptoms, heart failure with preserved ejection fraction, pulmonary hypertension, history of DVT requiring chronic anticoagulation,, atrial fibrillation, COPD, lymphedema was admitted on 06/06/2019 with cellulitis and sepsis.   #.   Acute kidney injury, urinalysis negative for protein, blood, 0-5 WBC -AKI is likely related to ATN secondary to sepsis -Creatinine has been increasing since July of this year -Continue to treat sepsis -Consider stress dose steroids  Enteric tube courses into the stomach and off the film as tip is not visualized. Right IJ central venous catheter has tip just below the cavoatrial junction. Lungs are adequately inflated demonstrate mild interval worsening airspace opacification over the central mid lungs and bilateral lung bases likely bilateral effusions with bibasilar atelectasis as well as interstitial edema. Multifocal infection is possible. Remainder the exam is unchanged. IMPRESSION: Interval worsening central airspace opacification over the mid lungs and bibasilar regions. Findings likely due to interstitial edema with bilateral effusions and bibasilar atelectasis. Multifocal infection is possible. Tubes and lines as described. Electronically Signed   By: Marin Olp M.D.   On: 06/06/2019 09:05   Dg Chest Port 1 View  Result Date: 06/05/2019 CLINICAL DATA:  Recent pill aspiration, initial encounter EXAM: PORTABLE CHEST 1 VIEW COMPARISON:  06/19/2019 FINDINGS: Cardiac shadow is enlarged. The overall inspiratory effort is poor. Left basilar atelectasis and small effusion are seen. No radiopaque foreign bodies are noted. No definitive bronchial occlusion is noted. No bony abnormality is seen. IMPRESSION: Left basilar atelectasis and small effusion somewhat progressed when compared with the prior exam. Overall poor inspiratory effort. Electronically Signed   By: Inez Catalina M.D.   On: 06/05/2019 23:33     Medications:   . sodium chloride Stopped (06/05/19  1154)  . albumin human Stopped (Jun 22, 2019 0012)  . ampicillin-sulbactam (UNASYN) IV Stopped (06/06/19 1532)  . dextrose 25 mL/hr at 06-22-19 0902  . fentaNYL infusion INTRAVENOUS 150 mcg/hr (2019-06-22 0902)  . norepinephrine (LEVOPHED) Adult infusion 6 mcg/min (2019-06-22 0411)  . phenylephrine (NEO-SYNEPHRINE) Adult infusion Stopped (22-Jun-2019 0129)  . sodium chloride     And  . sodium chloride    . vasopressin (PITRESSIN) infusion - *FOR SHOCK* 0.03 Units/min (2019-06-22 0100)   . calcium-vitamin D  1 tablet Oral Daily  . chlorhexidine gluconate (MEDLINE KIT)  15 mL Mouth Rinse BID  . Chlorhexidine Gluconate Cloth  6 each Topical Q0600  . [START ON 06/20/2019] cyanocobalamin  1,000 mcg Intramuscular Q30 days  . feeding supplement (ENSURE ENLIVE)  237 mL Oral BID BM  . feeding supplement (VITAL AF 1.2 CAL)  1,000 mL Per Tube Q24H  . hydrocortisone sod succinate (SOLU-CORTEF) inj  50 mg Intravenous Q6H  . hydroxychloroquine  200 mg Oral Daily  . letrozole  2.5 mg Oral Daily  . mouth rinse  15 mL Mouth Rinse 10 times per day  . multivitamin  15 mL Per Tube Daily  . mycophenolate  1,000 mg Oral BID  . nystatin  5 mL Oral QID  . pantoprazole sodium  40 mg Per Tube BID  . saccharomyces boulardii  250 mg Oral Daily  . valGANciclovir  450 mg Oral Q48H  . vancomycin variable dose per unstable renal function (pharmacist dosing)   Does not apply See admin instructions  . vitamin C  1,000 mg Oral Daily  . Warfarin - Pharmacist Dosing Inpatient   Does not apply q1800   sodium chloride, ondansetron (ZOFRAN) IV, ondansetron, oxyCODONE-acetaminophen, prochlorperazine  Assessment/ Plan:  84 y.o. female with systemic lupus erythematosus, remote lupus nephritis 1992, sicca symptoms, heart failure with preserved ejection fraction, pulmonary hypertension, history of DVT requiring chronic anticoagulation,, atrial fibrillation, COPD, lymphedema was admitted on 06/06/2019 with cellulitis and sepsis.   #.   Acute kidney injury, urinalysis negative for protein, blood, 0-5 WBC -AKI is likely related to ATN secondary to sepsis -Creatinine has been increasing since July of this year -Continue to treat sepsis -Consider stress dose steroids  for hypotension - urine P/C ratio 0.25  #  Chronic kidney disease st 3.   Baseline creatinine 1.2/GFR 42 from March 13, 2019  #.  Anemia Lab Results  Component Value Date   HGB 8.4 (L) 12-Jun-2019    -Monitor hemoglobin closely  #.  Generalized edema and hypoalbuminemia - 3rd spacing of fluid  #Systemic lupus erythematosus with history of lupus nephritis in the past.  Most recent urine protein to creatinine ratio 0.29 from March 06, 2019.  Will discuss with ICU team about holding Immunosuppression in context of ongoing sepsis Also on Plaquenil. Patient is followed by [facility] as outpatient  #Left humeral neck fracture -Immobilization of left arm & shoulder as per primary team and orthopedics - evaluated by Dr Roland Rack  #Thickening of sigmoid colon noted on CT abdomen -Further work-up as outpatient    LOS: [place] 09/23/209:28 AM  [place], [place]  Note: This note was prepared with Dragon dictation. Any transcription errors are unintentional  Enteric tube courses into the stomach and off the film as tip is not visualized. Right IJ central venous catheter has tip just below the cavoatrial junction. Lungs are adequately inflated demonstrate mild interval worsening airspace opacification over the central mid lungs and bilateral lung bases likely bilateral effusions with bibasilar atelectasis as well as interstitial edema. Multifocal infection is possible. Remainder the exam is unchanged. IMPRESSION: Interval worsening central airspace opacification over the mid lungs and bibasilar regions. Findings likely due to interstitial edema with bilateral effusions and bibasilar atelectasis. Multifocal infection is possible. Tubes and lines as described. Electronically Signed   By: Marin Olp M.D.   On: 06/06/2019 09:05   Dg Chest Port 1 View  Result Date: 06/05/2019 CLINICAL DATA:  Recent pill aspiration, initial encounter EXAM: PORTABLE CHEST 1 VIEW COMPARISON:  06/19/2019 FINDINGS: Cardiac shadow is enlarged. The overall inspiratory effort is poor. Left basilar atelectasis and small effusion are seen. No radiopaque foreign bodies are noted. No definitive bronchial occlusion is noted. No bony abnormality is seen. IMPRESSION: Left basilar atelectasis and small effusion somewhat progressed when compared with the prior exam. Overall poor inspiratory effort. Electronically Signed   By: Inez Catalina M.D.   On: 06/05/2019 23:33     Medications:   . sodium chloride Stopped (06/05/19  1154)  . albumin human Stopped (Jun 22, 2019 0012)  . ampicillin-sulbactam (UNASYN) IV Stopped (06/06/19 1532)  . dextrose 25 mL/hr at 06-22-19 0902  . fentaNYL infusion INTRAVENOUS 150 mcg/hr (2019-06-22 0902)  . norepinephrine (LEVOPHED) Adult infusion 6 mcg/min (2019-06-22 0411)  . phenylephrine (NEO-SYNEPHRINE) Adult infusion Stopped (22-Jun-2019 0129)  . sodium chloride     And  . sodium chloride    . vasopressin (PITRESSIN) infusion - *FOR SHOCK* 0.03 Units/min (2019-06-22 0100)   . calcium-vitamin D  1 tablet Oral Daily  . chlorhexidine gluconate (MEDLINE KIT)  15 mL Mouth Rinse BID  . Chlorhexidine Gluconate Cloth  6 each Topical Q0600  . [START ON 06/20/2019] cyanocobalamin  1,000 mcg Intramuscular Q30 days  . feeding supplement (ENSURE ENLIVE)  237 mL Oral BID BM  . feeding supplement (VITAL AF 1.2 CAL)  1,000 mL Per Tube Q24H  . hydrocortisone sod succinate (SOLU-CORTEF) inj  50 mg Intravenous Q6H  . hydroxychloroquine  200 mg Oral Daily  . letrozole  2.5 mg Oral Daily  . mouth rinse  15 mL Mouth Rinse 10 times per day  . multivitamin  15 mL Per Tube Daily  . mycophenolate  1,000 mg Oral BID  . nystatin  5 mL Oral QID  . pantoprazole sodium  40 mg Per Tube BID  . saccharomyces boulardii  250 mg Oral Daily  . valGANciclovir  450 mg Oral Q48H  . vancomycin variable dose per unstable renal function (pharmacist dosing)   Does not apply See admin instructions  . vitamin C  1,000 mg Oral Daily  . Warfarin - Pharmacist Dosing Inpatient   Does not apply q1800   sodium chloride, ondansetron (ZOFRAN) IV, ondansetron, oxyCODONE-acetaminophen, prochlorperazine  Assessment/ Plan:  84 y.o. female with systemic lupus erythematosus, remote lupus nephritis 1992, sicca symptoms, heart failure with preserved ejection fraction, pulmonary hypertension, history of DVT requiring chronic anticoagulation,, atrial fibrillation, COPD, lymphedema was admitted on 06/06/2019 with cellulitis and sepsis.   #.   Acute kidney injury, urinalysis negative for protein, blood, 0-5 WBC -AKI is likely related to ATN secondary to sepsis -Creatinine has been increasing since July of this year -Continue to treat sepsis -Consider stress dose steroids  Enteric tube courses into the stomach and off the film as tip is not visualized. Right IJ central venous catheter has tip just below the cavoatrial junction. Lungs are adequately inflated demonstrate mild interval worsening airspace opacification over the central mid lungs and bilateral lung bases likely bilateral effusions with bibasilar atelectasis as well as interstitial edema. Multifocal infection is possible. Remainder the exam is unchanged. IMPRESSION: Interval worsening central airspace opacification over the mid lungs and bibasilar regions. Findings likely due to interstitial edema with bilateral effusions and bibasilar atelectasis. Multifocal infection is possible. Tubes and lines as described. Electronically Signed   By: Marin Olp M.D.   On: 06/06/2019 09:05   Dg Chest Port 1 View  Result Date: 06/05/2019 CLINICAL DATA:  Recent pill aspiration, initial encounter EXAM: PORTABLE CHEST 1 VIEW COMPARISON:  06/19/2019 FINDINGS: Cardiac shadow is enlarged. The overall inspiratory effort is poor. Left basilar atelectasis and small effusion are seen. No radiopaque foreign bodies are noted. No definitive bronchial occlusion is noted. No bony abnormality is seen. IMPRESSION: Left basilar atelectasis and small effusion somewhat progressed when compared with the prior exam. Overall poor inspiratory effort. Electronically Signed   By: Inez Catalina M.D.   On: 06/05/2019 23:33     Medications:   . sodium chloride Stopped (06/05/19  1154)  . albumin human Stopped (Jun 22, 2019 0012)  . ampicillin-sulbactam (UNASYN) IV Stopped (06/06/19 1532)  . dextrose 25 mL/hr at 06-22-19 0902  . fentaNYL infusion INTRAVENOUS 150 mcg/hr (2019-06-22 0902)  . norepinephrine (LEVOPHED) Adult infusion 6 mcg/min (2019-06-22 0411)  . phenylephrine (NEO-SYNEPHRINE) Adult infusion Stopped (22-Jun-2019 0129)  . sodium chloride     And  . sodium chloride    . vasopressin (PITRESSIN) infusion - *FOR SHOCK* 0.03 Units/min (2019-06-22 0100)   . calcium-vitamin D  1 tablet Oral Daily  . chlorhexidine gluconate (MEDLINE KIT)  15 mL Mouth Rinse BID  . Chlorhexidine Gluconate Cloth  6 each Topical Q0600  . [START ON 06/20/2019] cyanocobalamin  1,000 mcg Intramuscular Q30 days  . feeding supplement (ENSURE ENLIVE)  237 mL Oral BID BM  . feeding supplement (VITAL AF 1.2 CAL)  1,000 mL Per Tube Q24H  . hydrocortisone sod succinate (SOLU-CORTEF) inj  50 mg Intravenous Q6H  . hydroxychloroquine  200 mg Oral Daily  . letrozole  2.5 mg Oral Daily  . mouth rinse  15 mL Mouth Rinse 10 times per day  . multivitamin  15 mL Per Tube Daily  . mycophenolate  1,000 mg Oral BID  . nystatin  5 mL Oral QID  . pantoprazole sodium  40 mg Per Tube BID  . saccharomyces boulardii  250 mg Oral Daily  . valGANciclovir  450 mg Oral Q48H  . vancomycin variable dose per unstable renal function (pharmacist dosing)   Does not apply See admin instructions  . vitamin C  1,000 mg Oral Daily  . Warfarin - Pharmacist Dosing Inpatient   Does not apply q1800   sodium chloride, ondansetron (ZOFRAN) IV, ondansetron, oxyCODONE-acetaminophen, prochlorperazine  Assessment/ Plan:  84 y.o. female with systemic lupus erythematosus, remote lupus nephritis 1992, sicca symptoms, heart failure with preserved ejection fraction, pulmonary hypertension, history of DVT requiring chronic anticoagulation,, atrial fibrillation, COPD, lymphedema was admitted on 06/06/2019 with cellulitis and sepsis.   #.   Acute kidney injury, urinalysis negative for protein, blood, 0-5 WBC -AKI is likely related to ATN secondary to sepsis -Creatinine has been increasing since July of this year -Continue to treat sepsis -Consider stress dose steroids

## 2019-06-20 DEATH — deceased

## 2019-06-24 ENCOUNTER — Ambulatory Visit: Payer: Medicare Other

## 2019-06-24 ENCOUNTER — Other Ambulatory Visit: Payer: Medicare Other

## 2019-07-01 ENCOUNTER — Ambulatory Visit: Payer: Medicare Other | Admitting: Internal Medicine

## 2019-07-04 ENCOUNTER — Other Ambulatory Visit: Payer: Self-pay | Admitting: Internal Medicine

## 2019-07-15 ENCOUNTER — Ambulatory Visit: Payer: Medicare Other

## 2019-07-15 ENCOUNTER — Other Ambulatory Visit: Payer: Self-pay | Admitting: Internal Medicine

## 2019-07-15 ENCOUNTER — Other Ambulatory Visit: Payer: Medicare Other

## 2019-07-21 ENCOUNTER — Other Ambulatory Visit: Payer: Self-pay | Admitting: Internal Medicine

## 2019-08-05 ENCOUNTER — Ambulatory Visit: Payer: Medicare Other

## 2019-08-05 ENCOUNTER — Ambulatory Visit: Payer: Medicare Other | Admitting: Oncology

## 2019-08-05 ENCOUNTER — Other Ambulatory Visit: Payer: Medicare Other

## 2019-08-16 ENCOUNTER — Ambulatory Visit: Payer: Medicare Other

## 2019-08-16 ENCOUNTER — Ambulatory Visit: Payer: Medicare Other | Admitting: Internal Medicine

## 2019-08-20 ENCOUNTER — Ambulatory Visit: Payer: Medicare Other | Admitting: Internal Medicine

## 2019-08-20 ENCOUNTER — Ambulatory Visit: Payer: Medicare Other

## 2022-08-30 NOTE — Telephone Encounter (Signed)
Error

## 2024-08-06 NOTE — Telephone Encounter (Signed)
 open in error
